# Patient Record
Sex: Female | Born: 1950 | Race: Black or African American | Hispanic: Yes | Smoking: Never smoker
Health system: Southern US, Community
[De-identification: ages and names within clinical notes are randomized; demographics above are authoritative.]

## PROBLEM LIST (undated history)

## (undated) DIAGNOSIS — D869 Sarcoidosis, unspecified: Secondary | ICD-10-CM

## (undated) DIAGNOSIS — M1990 Unspecified osteoarthritis, unspecified site: Secondary | ICD-10-CM

## (undated) DIAGNOSIS — J45909 Unspecified asthma, uncomplicated: Secondary | ICD-10-CM

## (undated) DIAGNOSIS — I509 Heart failure, unspecified: Secondary | ICD-10-CM

## (undated) DIAGNOSIS — I10 Essential (primary) hypertension: Secondary | ICD-10-CM

## (undated) DIAGNOSIS — Q600 Renal agenesis, unilateral: Secondary | ICD-10-CM

## (undated) DIAGNOSIS — D573 Sickle-cell trait: Secondary | ICD-10-CM

## (undated) DIAGNOSIS — G4730 Sleep apnea, unspecified: Secondary | ICD-10-CM

## (undated) DIAGNOSIS — M109 Gout, unspecified: Secondary | ICD-10-CM

## (undated) DIAGNOSIS — R0902 Hypoxemia: Secondary | ICD-10-CM

## (undated) DIAGNOSIS — J449 Chronic obstructive pulmonary disease, unspecified: Secondary | ICD-10-CM

## (undated) DIAGNOSIS — C801 Malignant (primary) neoplasm, unspecified: Secondary | ICD-10-CM

## (undated) DIAGNOSIS — R222 Localized swelling, mass and lump, trunk: Secondary | ICD-10-CM

## (undated) DIAGNOSIS — C3490 Malignant neoplasm of unspecified part of unspecified bronchus or lung: Secondary | ICD-10-CM

## (undated) HISTORY — DX: Unspecified osteoarthritis, unspecified site: M19.90

## (undated) HISTORY — DX: Hypoxemia: R09.02

## (undated) HISTORY — DX: Gout, unspecified: M10.9

## (undated) HISTORY — DX: Localized swelling, mass and lump, trunk: R22.2

## (undated) HISTORY — PX: TUBAL LIGATION: SHX77

## (undated) HISTORY — DX: Malignant (primary) neoplasm, unspecified: C80.1

## (undated) HISTORY — DX: Sarcoidosis, unspecified: D86.9

## (undated) HISTORY — DX: Unspecified asthma, uncomplicated: J45.909

## (undated) HISTORY — DX: Sleep apnea, unspecified: G47.30

## (undated) HISTORY — DX: Renal agenesis, unilateral: Q60.0

## (undated) HISTORY — DX: Chronic obstructive pulmonary disease, unspecified: J44.9

## (undated) HISTORY — DX: Heart failure, unspecified: I50.9

## (undated) HISTORY — DX: Essential (primary) hypertension: I10

## (undated) HISTORY — DX: Sickle-cell trait: D57.3

## (undated) HISTORY — DX: Malignant neoplasm of unspecified part of unspecified bronchus or lung: C34.90

## (undated) HISTORY — PX: LUNG BIOPSY: SHX232

## (undated) HISTORY — PX: VEIN LIGATION AND STRIPPING: SHX2653

---

## 2010-07-20 DIAGNOSIS — C3490 Malignant neoplasm of unspecified part of unspecified bronchus or lung: Secondary | ICD-10-CM

## 2010-07-20 HISTORY — DX: Malignant neoplasm of unspecified part of unspecified bronchus or lung: C34.90

## 2011-07-24 DIAGNOSIS — D869 Sarcoidosis, unspecified: Secondary | ICD-10-CM | POA: Diagnosis not present

## 2011-07-24 DIAGNOSIS — J449 Chronic obstructive pulmonary disease, unspecified: Secondary | ICD-10-CM | POA: Diagnosis not present

## 2011-07-29 DIAGNOSIS — E669 Obesity, unspecified: Secondary | ICD-10-CM | POA: Diagnosis not present

## 2011-07-29 DIAGNOSIS — J449 Chronic obstructive pulmonary disease, unspecified: Secondary | ICD-10-CM | POA: Diagnosis not present

## 2011-07-29 DIAGNOSIS — I119 Hypertensive heart disease without heart failure: Secondary | ICD-10-CM | POA: Diagnosis not present

## 2011-08-13 DIAGNOSIS — C3490 Malignant neoplasm of unspecified part of unspecified bronchus or lung: Secondary | ICD-10-CM | POA: Diagnosis not present

## 2011-08-14 DIAGNOSIS — C3490 Malignant neoplasm of unspecified part of unspecified bronchus or lung: Secondary | ICD-10-CM | POA: Diagnosis not present

## 2011-08-21 DIAGNOSIS — C3490 Malignant neoplasm of unspecified part of unspecified bronchus or lung: Secondary | ICD-10-CM | POA: Diagnosis not present

## 2011-09-03 DIAGNOSIS — R222 Localized swelling, mass and lump, trunk: Secondary | ICD-10-CM | POA: Diagnosis not present

## 2011-09-09 DIAGNOSIS — I10 Essential (primary) hypertension: Secondary | ICD-10-CM | POA: Diagnosis not present

## 2011-09-09 DIAGNOSIS — R222 Localized swelling, mass and lump, trunk: Secondary | ICD-10-CM | POA: Diagnosis not present

## 2011-09-09 DIAGNOSIS — D649 Anemia, unspecified: Secondary | ICD-10-CM | POA: Diagnosis not present

## 2011-09-14 DIAGNOSIS — C3490 Malignant neoplasm of unspecified part of unspecified bronchus or lung: Secondary | ICD-10-CM | POA: Diagnosis not present

## 2011-09-18 DIAGNOSIS — C3490 Malignant neoplasm of unspecified part of unspecified bronchus or lung: Secondary | ICD-10-CM | POA: Diagnosis not present

## 2011-09-25 DIAGNOSIS — C3410 Malignant neoplasm of upper lobe, unspecified bronchus or lung: Secondary | ICD-10-CM | POA: Diagnosis not present

## 2011-09-25 DIAGNOSIS — C3490 Malignant neoplasm of unspecified part of unspecified bronchus or lung: Secondary | ICD-10-CM | POA: Diagnosis not present

## 2011-09-30 DIAGNOSIS — I119 Hypertensive heart disease without heart failure: Secondary | ICD-10-CM | POA: Diagnosis not present

## 2011-09-30 DIAGNOSIS — J449 Chronic obstructive pulmonary disease, unspecified: Secondary | ICD-10-CM | POA: Diagnosis not present

## 2011-10-05 DIAGNOSIS — C3410 Malignant neoplasm of upper lobe, unspecified bronchus or lung: Secondary | ICD-10-CM | POA: Diagnosis not present

## 2011-10-05 DIAGNOSIS — C3490 Malignant neoplasm of unspecified part of unspecified bronchus or lung: Secondary | ICD-10-CM | POA: Diagnosis not present

## 2011-10-07 DIAGNOSIS — C3490 Malignant neoplasm of unspecified part of unspecified bronchus or lung: Secondary | ICD-10-CM | POA: Diagnosis not present

## 2011-10-07 DIAGNOSIS — C3410 Malignant neoplasm of upper lobe, unspecified bronchus or lung: Secondary | ICD-10-CM | POA: Diagnosis not present

## 2011-10-08 DIAGNOSIS — C3410 Malignant neoplasm of upper lobe, unspecified bronchus or lung: Secondary | ICD-10-CM | POA: Diagnosis not present

## 2011-10-08 DIAGNOSIS — C3490 Malignant neoplasm of unspecified part of unspecified bronchus or lung: Secondary | ICD-10-CM | POA: Diagnosis not present

## 2011-10-09 DIAGNOSIS — C3490 Malignant neoplasm of unspecified part of unspecified bronchus or lung: Secondary | ICD-10-CM | POA: Diagnosis not present

## 2011-10-12 DIAGNOSIS — C3490 Malignant neoplasm of unspecified part of unspecified bronchus or lung: Secondary | ICD-10-CM | POA: Diagnosis not present

## 2011-10-14 DIAGNOSIS — C3490 Malignant neoplasm of unspecified part of unspecified bronchus or lung: Secondary | ICD-10-CM | POA: Diagnosis not present

## 2011-10-14 DIAGNOSIS — C3410 Malignant neoplasm of upper lobe, unspecified bronchus or lung: Secondary | ICD-10-CM | POA: Diagnosis not present

## 2011-10-16 DIAGNOSIS — C3490 Malignant neoplasm of unspecified part of unspecified bronchus or lung: Secondary | ICD-10-CM | POA: Diagnosis not present

## 2011-10-19 DIAGNOSIS — C3490 Malignant neoplasm of unspecified part of unspecified bronchus or lung: Secondary | ICD-10-CM | POA: Diagnosis not present

## 2011-11-11 DIAGNOSIS — C3490 Malignant neoplasm of unspecified part of unspecified bronchus or lung: Secondary | ICD-10-CM | POA: Diagnosis not present

## 2011-12-02 DIAGNOSIS — I119 Hypertensive heart disease without heart failure: Secondary | ICD-10-CM | POA: Diagnosis not present

## 2011-12-02 DIAGNOSIS — J441 Chronic obstructive pulmonary disease with (acute) exacerbation: Secondary | ICD-10-CM | POA: Diagnosis not present

## 2012-01-04 DIAGNOSIS — I119 Hypertensive heart disease without heart failure: Secondary | ICD-10-CM | POA: Diagnosis not present

## 2012-01-04 DIAGNOSIS — J441 Chronic obstructive pulmonary disease with (acute) exacerbation: Secondary | ICD-10-CM | POA: Diagnosis not present

## 2012-01-04 DIAGNOSIS — R0602 Shortness of breath: Secondary | ICD-10-CM | POA: Diagnosis not present

## 2012-01-04 DIAGNOSIS — D0220 Carcinoma in situ of unspecified bronchus and lung: Secondary | ICD-10-CM | POA: Diagnosis not present

## 2012-01-04 DIAGNOSIS — R609 Edema, unspecified: Secondary | ICD-10-CM | POA: Diagnosis not present

## 2012-01-04 DIAGNOSIS — R0902 Hypoxemia: Secondary | ICD-10-CM | POA: Diagnosis not present

## 2012-01-04 DIAGNOSIS — J189 Pneumonia, unspecified organism: Secondary | ICD-10-CM | POA: Diagnosis not present

## 2012-01-05 DIAGNOSIS — R0602 Shortness of breath: Secondary | ICD-10-CM | POA: Diagnosis not present

## 2012-01-05 DIAGNOSIS — I119 Hypertensive heart disease without heart failure: Secondary | ICD-10-CM | POA: Diagnosis not present

## 2012-01-05 DIAGNOSIS — R609 Edema, unspecified: Secondary | ICD-10-CM | POA: Diagnosis not present

## 2012-01-05 DIAGNOSIS — I509 Heart failure, unspecified: Secondary | ICD-10-CM | POA: Diagnosis not present

## 2012-01-05 DIAGNOSIS — G4733 Obstructive sleep apnea (adult) (pediatric): Secondary | ICD-10-CM | POA: Diagnosis not present

## 2012-01-05 DIAGNOSIS — J441 Chronic obstructive pulmonary disease with (acute) exacerbation: Secondary | ICD-10-CM | POA: Diagnosis not present

## 2012-01-05 DIAGNOSIS — J99 Respiratory disorders in diseases classified elsewhere: Secondary | ICD-10-CM | POA: Diagnosis not present

## 2012-01-05 DIAGNOSIS — R0902 Hypoxemia: Secondary | ICD-10-CM | POA: Diagnosis not present

## 2012-01-05 DIAGNOSIS — D0220 Carcinoma in situ of unspecified bronchus and lung: Secondary | ICD-10-CM | POA: Diagnosis not present

## 2012-01-05 DIAGNOSIS — D869 Sarcoidosis, unspecified: Secondary | ICD-10-CM | POA: Diagnosis not present

## 2012-01-05 DIAGNOSIS — J9600 Acute respiratory failure, unspecified whether with hypoxia or hypercapnia: Secondary | ICD-10-CM | POA: Diagnosis not present

## 2012-01-05 DIAGNOSIS — C3410 Malignant neoplasm of upper lobe, unspecified bronchus or lung: Secondary | ICD-10-CM | POA: Diagnosis not present

## 2012-01-05 DIAGNOSIS — J189 Pneumonia, unspecified organism: Secondary | ICD-10-CM | POA: Diagnosis not present

## 2012-01-05 DIAGNOSIS — J159 Unspecified bacterial pneumonia: Secondary | ICD-10-CM | POA: Diagnosis not present

## 2012-01-08 DIAGNOSIS — Z6841 Body Mass Index (BMI) 40.0 and over, adult: Secondary | ICD-10-CM | POA: Diagnosis not present

## 2012-01-08 DIAGNOSIS — IMO0002 Reserved for concepts with insufficient information to code with codable children: Secondary | ICD-10-CM | POA: Diagnosis not present

## 2012-01-08 DIAGNOSIS — J441 Chronic obstructive pulmonary disease with (acute) exacerbation: Secondary | ICD-10-CM | POA: Diagnosis not present

## 2012-01-08 DIAGNOSIS — M1990 Unspecified osteoarthritis, unspecified site: Secondary | ICD-10-CM | POA: Diagnosis not present

## 2012-01-08 DIAGNOSIS — J189 Pneumonia, unspecified organism: Secondary | ICD-10-CM | POA: Diagnosis not present

## 2012-01-08 DIAGNOSIS — I119 Hypertensive heart disease without heart failure: Secondary | ICD-10-CM | POA: Diagnosis not present

## 2012-01-11 DIAGNOSIS — Z6841 Body Mass Index (BMI) 40.0 and over, adult: Secondary | ICD-10-CM | POA: Diagnosis not present

## 2012-01-11 DIAGNOSIS — M1990 Unspecified osteoarthritis, unspecified site: Secondary | ICD-10-CM | POA: Diagnosis not present

## 2012-01-11 DIAGNOSIS — J441 Chronic obstructive pulmonary disease with (acute) exacerbation: Secondary | ICD-10-CM | POA: Diagnosis not present

## 2012-01-11 DIAGNOSIS — I119 Hypertensive heart disease without heart failure: Secondary | ICD-10-CM | POA: Diagnosis not present

## 2012-01-11 DIAGNOSIS — J189 Pneumonia, unspecified organism: Secondary | ICD-10-CM | POA: Diagnosis not present

## 2012-01-13 DIAGNOSIS — J449 Chronic obstructive pulmonary disease, unspecified: Secondary | ICD-10-CM | POA: Diagnosis not present

## 2012-01-13 DIAGNOSIS — L0291 Cutaneous abscess, unspecified: Secondary | ICD-10-CM | POA: Diagnosis not present

## 2012-01-13 DIAGNOSIS — I119 Hypertensive heart disease without heart failure: Secondary | ICD-10-CM | POA: Diagnosis not present

## 2012-01-13 DIAGNOSIS — D0220 Carcinoma in situ of unspecified bronchus and lung: Secondary | ICD-10-CM | POA: Diagnosis not present

## 2012-01-14 DIAGNOSIS — M1990 Unspecified osteoarthritis, unspecified site: Secondary | ICD-10-CM | POA: Diagnosis not present

## 2012-01-14 DIAGNOSIS — Z6841 Body Mass Index (BMI) 40.0 and over, adult: Secondary | ICD-10-CM | POA: Diagnosis not present

## 2012-01-14 DIAGNOSIS — J189 Pneumonia, unspecified organism: Secondary | ICD-10-CM | POA: Diagnosis not present

## 2012-01-14 DIAGNOSIS — I119 Hypertensive heart disease without heart failure: Secondary | ICD-10-CM | POA: Diagnosis not present

## 2012-01-14 DIAGNOSIS — J441 Chronic obstructive pulmonary disease with (acute) exacerbation: Secondary | ICD-10-CM | POA: Diagnosis not present

## 2012-01-18 DIAGNOSIS — J189 Pneumonia, unspecified organism: Secondary | ICD-10-CM | POA: Diagnosis not present

## 2012-01-18 DIAGNOSIS — J441 Chronic obstructive pulmonary disease with (acute) exacerbation: Secondary | ICD-10-CM | POA: Diagnosis not present

## 2012-01-18 DIAGNOSIS — M1990 Unspecified osteoarthritis, unspecified site: Secondary | ICD-10-CM | POA: Diagnosis not present

## 2012-01-18 DIAGNOSIS — I119 Hypertensive heart disease without heart failure: Secondary | ICD-10-CM | POA: Diagnosis not present

## 2012-01-18 DIAGNOSIS — Z6841 Body Mass Index (BMI) 40.0 and over, adult: Secondary | ICD-10-CM | POA: Diagnosis not present

## 2012-01-20 DIAGNOSIS — I119 Hypertensive heart disease without heart failure: Secondary | ICD-10-CM | POA: Diagnosis not present

## 2012-01-20 DIAGNOSIS — Z6841 Body Mass Index (BMI) 40.0 and over, adult: Secondary | ICD-10-CM | POA: Diagnosis not present

## 2012-01-20 DIAGNOSIS — M1990 Unspecified osteoarthritis, unspecified site: Secondary | ICD-10-CM | POA: Diagnosis not present

## 2012-01-20 DIAGNOSIS — J441 Chronic obstructive pulmonary disease with (acute) exacerbation: Secondary | ICD-10-CM | POA: Diagnosis not present

## 2012-01-20 DIAGNOSIS — J189 Pneumonia, unspecified organism: Secondary | ICD-10-CM | POA: Diagnosis not present

## 2012-01-22 DIAGNOSIS — J441 Chronic obstructive pulmonary disease with (acute) exacerbation: Secondary | ICD-10-CM | POA: Diagnosis not present

## 2012-01-22 DIAGNOSIS — E119 Type 2 diabetes mellitus without complications: Secondary | ICD-10-CM | POA: Diagnosis not present

## 2012-01-22 DIAGNOSIS — M1990 Unspecified osteoarthritis, unspecified site: Secondary | ICD-10-CM | POA: Diagnosis not present

## 2012-01-22 DIAGNOSIS — M2550 Pain in unspecified joint: Secondary | ICD-10-CM | POA: Diagnosis not present

## 2012-01-22 DIAGNOSIS — J189 Pneumonia, unspecified organism: Secondary | ICD-10-CM | POA: Diagnosis not present

## 2012-01-22 DIAGNOSIS — Z6841 Body Mass Index (BMI) 40.0 and over, adult: Secondary | ICD-10-CM | POA: Diagnosis not present

## 2012-01-22 DIAGNOSIS — I119 Hypertensive heart disease without heart failure: Secondary | ICD-10-CM | POA: Diagnosis not present

## 2012-01-22 DIAGNOSIS — M109 Gout, unspecified: Secondary | ICD-10-CM | POA: Diagnosis not present

## 2012-01-22 DIAGNOSIS — I10 Essential (primary) hypertension: Secondary | ICD-10-CM | POA: Diagnosis not present

## 2012-01-22 DIAGNOSIS — M129 Arthropathy, unspecified: Secondary | ICD-10-CM | POA: Diagnosis not present

## 2012-01-27 DIAGNOSIS — I119 Hypertensive heart disease without heart failure: Secondary | ICD-10-CM | POA: Diagnosis not present

## 2012-01-27 DIAGNOSIS — M1990 Unspecified osteoarthritis, unspecified site: Secondary | ICD-10-CM | POA: Diagnosis not present

## 2012-01-27 DIAGNOSIS — Z6841 Body Mass Index (BMI) 40.0 and over, adult: Secondary | ICD-10-CM | POA: Diagnosis not present

## 2012-01-27 DIAGNOSIS — J441 Chronic obstructive pulmonary disease with (acute) exacerbation: Secondary | ICD-10-CM | POA: Diagnosis not present

## 2012-01-27 DIAGNOSIS — J189 Pneumonia, unspecified organism: Secondary | ICD-10-CM | POA: Diagnosis not present

## 2012-02-02 DIAGNOSIS — Z85118 Personal history of other malignant neoplasm of bronchus and lung: Secondary | ICD-10-CM | POA: Diagnosis not present

## 2012-02-02 DIAGNOSIS — M129 Arthropathy, unspecified: Secondary | ICD-10-CM | POA: Diagnosis not present

## 2012-02-02 DIAGNOSIS — E669 Obesity, unspecified: Secondary | ICD-10-CM | POA: Diagnosis not present

## 2012-02-02 DIAGNOSIS — J449 Chronic obstructive pulmonary disease, unspecified: Secondary | ICD-10-CM | POA: Diagnosis not present

## 2012-02-02 DIAGNOSIS — L03119 Cellulitis of unspecified part of limb: Secondary | ICD-10-CM | POA: Diagnosis not present

## 2012-02-02 DIAGNOSIS — I872 Venous insufficiency (chronic) (peripheral): Secondary | ICD-10-CM | POA: Diagnosis not present

## 2012-02-02 DIAGNOSIS — L02419 Cutaneous abscess of limb, unspecified: Secondary | ICD-10-CM | POA: Diagnosis not present

## 2012-02-03 DIAGNOSIS — J441 Chronic obstructive pulmonary disease with (acute) exacerbation: Secondary | ICD-10-CM | POA: Diagnosis not present

## 2012-02-03 DIAGNOSIS — J189 Pneumonia, unspecified organism: Secondary | ICD-10-CM | POA: Diagnosis not present

## 2012-02-03 DIAGNOSIS — I119 Hypertensive heart disease without heart failure: Secondary | ICD-10-CM | POA: Diagnosis not present

## 2012-02-03 DIAGNOSIS — Z6841 Body Mass Index (BMI) 40.0 and over, adult: Secondary | ICD-10-CM | POA: Diagnosis not present

## 2012-02-03 DIAGNOSIS — M1990 Unspecified osteoarthritis, unspecified site: Secondary | ICD-10-CM | POA: Diagnosis not present

## 2012-02-09 DIAGNOSIS — D869 Sarcoidosis, unspecified: Secondary | ICD-10-CM | POA: Diagnosis not present

## 2012-02-09 DIAGNOSIS — J449 Chronic obstructive pulmonary disease, unspecified: Secondary | ICD-10-CM | POA: Diagnosis not present

## 2012-02-10 DIAGNOSIS — I119 Hypertensive heart disease without heart failure: Secondary | ICD-10-CM | POA: Diagnosis not present

## 2012-02-10 DIAGNOSIS — J189 Pneumonia, unspecified organism: Secondary | ICD-10-CM | POA: Diagnosis not present

## 2012-02-10 DIAGNOSIS — M1990 Unspecified osteoarthritis, unspecified site: Secondary | ICD-10-CM | POA: Diagnosis not present

## 2012-02-10 DIAGNOSIS — J441 Chronic obstructive pulmonary disease with (acute) exacerbation: Secondary | ICD-10-CM | POA: Diagnosis not present

## 2012-02-10 DIAGNOSIS — R0989 Other specified symptoms and signs involving the circulatory and respiratory systems: Secondary | ICD-10-CM | POA: Diagnosis not present

## 2012-02-10 DIAGNOSIS — Z6841 Body Mass Index (BMI) 40.0 and over, adult: Secondary | ICD-10-CM | POA: Diagnosis not present

## 2012-02-10 DIAGNOSIS — M7989 Other specified soft tissue disorders: Secondary | ICD-10-CM | POA: Diagnosis not present

## 2012-02-10 DIAGNOSIS — I70219 Atherosclerosis of native arteries of extremities with intermittent claudication, unspecified extremity: Secondary | ICD-10-CM | POA: Diagnosis not present

## 2012-02-11 DIAGNOSIS — C3410 Malignant neoplasm of upper lobe, unspecified bronchus or lung: Secondary | ICD-10-CM | POA: Diagnosis not present

## 2012-02-12 DIAGNOSIS — C3410 Malignant neoplasm of upper lobe, unspecified bronchus or lung: Secondary | ICD-10-CM | POA: Diagnosis not present

## 2012-02-17 DIAGNOSIS — J449 Chronic obstructive pulmonary disease, unspecified: Secondary | ICD-10-CM | POA: Diagnosis not present

## 2012-02-17 DIAGNOSIS — C3490 Malignant neoplasm of unspecified part of unspecified bronchus or lung: Secondary | ICD-10-CM | POA: Diagnosis not present

## 2012-02-17 DIAGNOSIS — C3410 Malignant neoplasm of upper lobe, unspecified bronchus or lung: Secondary | ICD-10-CM | POA: Diagnosis not present

## 2012-02-17 DIAGNOSIS — E669 Obesity, unspecified: Secondary | ICD-10-CM | POA: Diagnosis not present

## 2012-03-24 DIAGNOSIS — Z9981 Dependence on supplemental oxygen: Secondary | ICD-10-CM | POA: Diagnosis not present

## 2012-03-24 DIAGNOSIS — I872 Venous insufficiency (chronic) (peripheral): Secondary | ICD-10-CM | POA: Diagnosis present

## 2012-03-24 DIAGNOSIS — J45901 Unspecified asthma with (acute) exacerbation: Secondary | ICD-10-CM | POA: Diagnosis not present

## 2012-03-24 DIAGNOSIS — Z85118 Personal history of other malignant neoplasm of bronchus and lung: Secondary | ICD-10-CM | POA: Diagnosis not present

## 2012-03-24 DIAGNOSIS — R079 Chest pain, unspecified: Secondary | ICD-10-CM | POA: Diagnosis not present

## 2012-03-24 DIAGNOSIS — R0602 Shortness of breath: Secondary | ICD-10-CM | POA: Diagnosis not present

## 2012-03-24 DIAGNOSIS — C801 Malignant (primary) neoplasm, unspecified: Secondary | ICD-10-CM | POA: Diagnosis not present

## 2012-03-24 DIAGNOSIS — Z418 Encounter for other procedures for purposes other than remedying health state: Secondary | ICD-10-CM | POA: Diagnosis not present

## 2012-03-24 DIAGNOSIS — I10 Essential (primary) hypertension: Secondary | ICD-10-CM | POA: Diagnosis not present

## 2012-03-24 DIAGNOSIS — I509 Heart failure, unspecified: Secondary | ICD-10-CM | POA: Diagnosis not present

## 2012-03-24 DIAGNOSIS — G4733 Obstructive sleep apnea (adult) (pediatric): Secondary | ICD-10-CM | POA: Diagnosis not present

## 2012-03-24 DIAGNOSIS — L02419 Cutaneous abscess of limb, unspecified: Secondary | ICD-10-CM | POA: Diagnosis not present

## 2012-03-24 DIAGNOSIS — J441 Chronic obstructive pulmonary disease with (acute) exacerbation: Secondary | ICD-10-CM | POA: Diagnosis present

## 2012-03-26 DIAGNOSIS — I509 Heart failure, unspecified: Secondary | ICD-10-CM | POA: Diagnosis not present

## 2012-03-26 DIAGNOSIS — R079 Chest pain, unspecified: Secondary | ICD-10-CM | POA: Diagnosis not present

## 2012-04-06 DIAGNOSIS — I119 Hypertensive heart disease without heart failure: Secondary | ICD-10-CM | POA: Diagnosis not present

## 2012-04-06 DIAGNOSIS — J449 Chronic obstructive pulmonary disease, unspecified: Secondary | ICD-10-CM | POA: Diagnosis not present

## 2012-04-12 DIAGNOSIS — J449 Chronic obstructive pulmonary disease, unspecified: Secondary | ICD-10-CM | POA: Diagnosis not present

## 2012-04-12 DIAGNOSIS — D869 Sarcoidosis, unspecified: Secondary | ICD-10-CM | POA: Diagnosis not present

## 2012-05-16 DIAGNOSIS — C3410 Malignant neoplasm of upper lobe, unspecified bronchus or lung: Secondary | ICD-10-CM | POA: Diagnosis not present

## 2012-05-18 DIAGNOSIS — C3410 Malignant neoplasm of upper lobe, unspecified bronchus or lung: Secondary | ICD-10-CM | POA: Diagnosis not present

## 2012-05-18 DIAGNOSIS — C3490 Malignant neoplasm of unspecified part of unspecified bronchus or lung: Secondary | ICD-10-CM | POA: Diagnosis not present

## 2012-06-01 DIAGNOSIS — I119 Hypertensive heart disease without heart failure: Secondary | ICD-10-CM | POA: Diagnosis not present

## 2012-06-01 DIAGNOSIS — J449 Chronic obstructive pulmonary disease, unspecified: Secondary | ICD-10-CM | POA: Diagnosis not present

## 2012-06-10 DIAGNOSIS — Z1231 Encounter for screening mammogram for malignant neoplasm of breast: Secondary | ICD-10-CM | POA: Diagnosis not present

## 2012-08-17 DIAGNOSIS — C3410 Malignant neoplasm of upper lobe, unspecified bronchus or lung: Secondary | ICD-10-CM | POA: Diagnosis not present

## 2012-08-20 DIAGNOSIS — C3410 Malignant neoplasm of upper lobe, unspecified bronchus or lung: Secondary | ICD-10-CM | POA: Diagnosis not present

## 2012-08-24 DIAGNOSIS — J449 Chronic obstructive pulmonary disease, unspecified: Secondary | ICD-10-CM | POA: Diagnosis not present

## 2012-08-24 DIAGNOSIS — E119 Type 2 diabetes mellitus without complications: Secondary | ICD-10-CM | POA: Diagnosis not present

## 2012-08-24 DIAGNOSIS — I119 Hypertensive heart disease without heart failure: Secondary | ICD-10-CM | POA: Diagnosis not present

## 2012-08-24 DIAGNOSIS — R195 Other fecal abnormalities: Secondary | ICD-10-CM | POA: Diagnosis not present

## 2012-08-24 DIAGNOSIS — D649 Anemia, unspecified: Secondary | ICD-10-CM | POA: Diagnosis not present

## 2012-08-24 DIAGNOSIS — E7800 Pure hypercholesterolemia, unspecified: Secondary | ICD-10-CM | POA: Diagnosis not present

## 2012-08-29 DIAGNOSIS — D869 Sarcoidosis, unspecified: Secondary | ICD-10-CM | POA: Diagnosis not present

## 2012-08-29 DIAGNOSIS — J9819 Other pulmonary collapse: Secondary | ICD-10-CM | POA: Diagnosis not present

## 2012-08-31 DIAGNOSIS — C3410 Malignant neoplasm of upper lobe, unspecified bronchus or lung: Secondary | ICD-10-CM | POA: Diagnosis not present

## 2012-10-19 DIAGNOSIS — I119 Hypertensive heart disease without heart failure: Secondary | ICD-10-CM | POA: Diagnosis not present

## 2012-10-19 DIAGNOSIS — J449 Chronic obstructive pulmonary disease, unspecified: Secondary | ICD-10-CM | POA: Diagnosis not present

## 2012-12-21 DIAGNOSIS — I119 Hypertensive heart disease without heart failure: Secondary | ICD-10-CM | POA: Diagnosis not present

## 2012-12-21 DIAGNOSIS — C3410 Malignant neoplasm of upper lobe, unspecified bronchus or lung: Secondary | ICD-10-CM | POA: Diagnosis not present

## 2012-12-21 DIAGNOSIS — J449 Chronic obstructive pulmonary disease, unspecified: Secondary | ICD-10-CM | POA: Diagnosis not present

## 2013-01-04 DIAGNOSIS — C3410 Malignant neoplasm of upper lobe, unspecified bronchus or lung: Secondary | ICD-10-CM | POA: Diagnosis not present

## 2013-01-04 DIAGNOSIS — K7689 Other specified diseases of liver: Secondary | ICD-10-CM | POA: Diagnosis not present

## 2013-01-04 DIAGNOSIS — C3490 Malignant neoplasm of unspecified part of unspecified bronchus or lung: Secondary | ICD-10-CM | POA: Diagnosis not present

## 2013-02-13 DIAGNOSIS — Z9981 Dependence on supplemental oxygen: Secondary | ICD-10-CM | POA: Diagnosis not present

## 2013-02-13 DIAGNOSIS — R0989 Other specified symptoms and signs involving the circulatory and respiratory systems: Secondary | ICD-10-CM | POA: Diagnosis not present

## 2013-02-13 DIAGNOSIS — J441 Chronic obstructive pulmonary disease with (acute) exacerbation: Secondary | ICD-10-CM | POA: Diagnosis not present

## 2013-02-13 DIAGNOSIS — I509 Heart failure, unspecified: Secondary | ICD-10-CM | POA: Diagnosis not present

## 2013-02-13 DIAGNOSIS — J4489 Other specified chronic obstructive pulmonary disease: Secondary | ICD-10-CM | POA: Diagnosis not present

## 2013-02-13 DIAGNOSIS — Z8249 Family history of ischemic heart disease and other diseases of the circulatory system: Secondary | ICD-10-CM | POA: Diagnosis not present

## 2013-02-13 DIAGNOSIS — J449 Chronic obstructive pulmonary disease, unspecified: Secondary | ICD-10-CM | POA: Diagnosis not present

## 2013-02-13 DIAGNOSIS — R0609 Other forms of dyspnea: Secondary | ICD-10-CM | POA: Diagnosis not present

## 2013-02-13 DIAGNOSIS — E872 Acidosis: Secondary | ICD-10-CM | POA: Diagnosis not present

## 2013-02-13 DIAGNOSIS — R079 Chest pain, unspecified: Secondary | ICD-10-CM | POA: Diagnosis not present

## 2013-02-13 DIAGNOSIS — I129 Hypertensive chronic kidney disease with stage 1 through stage 4 chronic kidney disease, or unspecified chronic kidney disease: Secondary | ICD-10-CM | POA: Diagnosis not present

## 2013-02-13 DIAGNOSIS — N189 Chronic kidney disease, unspecified: Secondary | ICD-10-CM | POA: Diagnosis not present

## 2013-02-13 DIAGNOSIS — R030 Elevated blood-pressure reading, without diagnosis of hypertension: Secondary | ICD-10-CM | POA: Diagnosis not present

## 2013-02-13 DIAGNOSIS — I5030 Unspecified diastolic (congestive) heart failure: Secondary | ICD-10-CM | POA: Diagnosis not present

## 2013-02-13 DIAGNOSIS — Z9119 Patient's noncompliance with other medical treatment and regimen: Secondary | ICD-10-CM | POA: Diagnosis not present

## 2013-02-13 DIAGNOSIS — J9600 Acute respiratory failure, unspecified whether with hypoxia or hypercapnia: Secondary | ICD-10-CM | POA: Diagnosis not present

## 2013-02-13 DIAGNOSIS — J440 Chronic obstructive pulmonary disease with acute lower respiratory infection: Secondary | ICD-10-CM | POA: Diagnosis not present

## 2013-02-13 DIAGNOSIS — J209 Acute bronchitis, unspecified: Secondary | ICD-10-CM | POA: Diagnosis not present

## 2013-02-13 DIAGNOSIS — G4733 Obstructive sleep apnea (adult) (pediatric): Secondary | ICD-10-CM | POA: Diagnosis not present

## 2013-02-18 DIAGNOSIS — I5030 Unspecified diastolic (congestive) heart failure: Secondary | ICD-10-CM | POA: Diagnosis not present

## 2013-02-18 DIAGNOSIS — G4733 Obstructive sleep apnea (adult) (pediatric): Secondary | ICD-10-CM | POA: Diagnosis not present

## 2013-02-18 DIAGNOSIS — J45901 Unspecified asthma with (acute) exacerbation: Secondary | ICD-10-CM | POA: Diagnosis not present

## 2013-02-18 DIAGNOSIS — R5381 Other malaise: Secondary | ICD-10-CM | POA: Diagnosis not present

## 2013-02-18 DIAGNOSIS — I509 Heart failure, unspecified: Secondary | ICD-10-CM | POA: Diagnosis not present

## 2013-02-18 DIAGNOSIS — Z5189 Encounter for other specified aftercare: Secondary | ICD-10-CM | POA: Diagnosis not present

## 2013-02-18 DIAGNOSIS — I129 Hypertensive chronic kidney disease with stage 1 through stage 4 chronic kidney disease, or unspecified chronic kidney disease: Secondary | ICD-10-CM | POA: Diagnosis not present

## 2013-02-18 DIAGNOSIS — Z9981 Dependence on supplemental oxygen: Secondary | ICD-10-CM | POA: Diagnosis not present

## 2013-02-18 DIAGNOSIS — N189 Chronic kidney disease, unspecified: Secondary | ICD-10-CM | POA: Diagnosis not present

## 2013-02-18 DIAGNOSIS — Z6841 Body Mass Index (BMI) 40.0 and over, adult: Secondary | ICD-10-CM | POA: Diagnosis not present

## 2013-02-18 DIAGNOSIS — Z85118 Personal history of other malignant neoplasm of bronchus and lung: Secondary | ICD-10-CM | POA: Diagnosis not present

## 2013-02-20 DIAGNOSIS — J441 Chronic obstructive pulmonary disease with (acute) exacerbation: Secondary | ICD-10-CM | POA: Diagnosis not present

## 2013-02-20 DIAGNOSIS — I509 Heart failure, unspecified: Secondary | ICD-10-CM | POA: Diagnosis not present

## 2013-02-20 DIAGNOSIS — I5030 Unspecified diastolic (congestive) heart failure: Secondary | ICD-10-CM | POA: Diagnosis not present

## 2013-02-20 DIAGNOSIS — I129 Hypertensive chronic kidney disease with stage 1 through stage 4 chronic kidney disease, or unspecified chronic kidney disease: Secondary | ICD-10-CM | POA: Diagnosis not present

## 2013-02-20 DIAGNOSIS — G4733 Obstructive sleep apnea (adult) (pediatric): Secondary | ICD-10-CM | POA: Diagnosis not present

## 2013-02-20 DIAGNOSIS — Z5189 Encounter for other specified aftercare: Secondary | ICD-10-CM | POA: Diagnosis not present

## 2013-02-21 DIAGNOSIS — G4733 Obstructive sleep apnea (adult) (pediatric): Secondary | ICD-10-CM | POA: Diagnosis not present

## 2013-02-21 DIAGNOSIS — I129 Hypertensive chronic kidney disease with stage 1 through stage 4 chronic kidney disease, or unspecified chronic kidney disease: Secondary | ICD-10-CM | POA: Diagnosis not present

## 2013-02-21 DIAGNOSIS — J441 Chronic obstructive pulmonary disease with (acute) exacerbation: Secondary | ICD-10-CM | POA: Diagnosis not present

## 2013-02-21 DIAGNOSIS — I5030 Unspecified diastolic (congestive) heart failure: Secondary | ICD-10-CM | POA: Diagnosis not present

## 2013-02-21 DIAGNOSIS — I509 Heart failure, unspecified: Secondary | ICD-10-CM | POA: Diagnosis not present

## 2013-02-21 DIAGNOSIS — Z5189 Encounter for other specified aftercare: Secondary | ICD-10-CM | POA: Diagnosis not present

## 2013-02-22 DIAGNOSIS — I119 Hypertensive heart disease without heart failure: Secondary | ICD-10-CM | POA: Diagnosis not present

## 2013-02-22 DIAGNOSIS — J449 Chronic obstructive pulmonary disease, unspecified: Secondary | ICD-10-CM | POA: Diagnosis not present

## 2013-02-23 DIAGNOSIS — Z5189 Encounter for other specified aftercare: Secondary | ICD-10-CM | POA: Diagnosis not present

## 2013-02-23 DIAGNOSIS — I5030 Unspecified diastolic (congestive) heart failure: Secondary | ICD-10-CM | POA: Diagnosis not present

## 2013-02-23 DIAGNOSIS — G4733 Obstructive sleep apnea (adult) (pediatric): Secondary | ICD-10-CM | POA: Diagnosis not present

## 2013-02-23 DIAGNOSIS — J45901 Unspecified asthma with (acute) exacerbation: Secondary | ICD-10-CM | POA: Diagnosis not present

## 2013-02-23 DIAGNOSIS — I129 Hypertensive chronic kidney disease with stage 1 through stage 4 chronic kidney disease, or unspecified chronic kidney disease: Secondary | ICD-10-CM | POA: Diagnosis not present

## 2013-02-23 DIAGNOSIS — I509 Heart failure, unspecified: Secondary | ICD-10-CM | POA: Diagnosis not present

## 2013-02-24 DIAGNOSIS — I129 Hypertensive chronic kidney disease with stage 1 through stage 4 chronic kidney disease, or unspecified chronic kidney disease: Secondary | ICD-10-CM | POA: Diagnosis not present

## 2013-02-24 DIAGNOSIS — G4733 Obstructive sleep apnea (adult) (pediatric): Secondary | ICD-10-CM | POA: Diagnosis not present

## 2013-02-24 DIAGNOSIS — J45901 Unspecified asthma with (acute) exacerbation: Secondary | ICD-10-CM | POA: Diagnosis not present

## 2013-02-24 DIAGNOSIS — I5030 Unspecified diastolic (congestive) heart failure: Secondary | ICD-10-CM | POA: Diagnosis not present

## 2013-02-24 DIAGNOSIS — I509 Heart failure, unspecified: Secondary | ICD-10-CM | POA: Diagnosis not present

## 2013-02-24 DIAGNOSIS — Z5189 Encounter for other specified aftercare: Secondary | ICD-10-CM | POA: Diagnosis not present

## 2013-03-01 DIAGNOSIS — I5030 Unspecified diastolic (congestive) heart failure: Secondary | ICD-10-CM | POA: Diagnosis not present

## 2013-03-01 DIAGNOSIS — J45901 Unspecified asthma with (acute) exacerbation: Secondary | ICD-10-CM | POA: Diagnosis not present

## 2013-03-01 DIAGNOSIS — I509 Heart failure, unspecified: Secondary | ICD-10-CM | POA: Diagnosis not present

## 2013-03-01 DIAGNOSIS — Z5189 Encounter for other specified aftercare: Secondary | ICD-10-CM | POA: Diagnosis not present

## 2013-03-01 DIAGNOSIS — G4733 Obstructive sleep apnea (adult) (pediatric): Secondary | ICD-10-CM | POA: Diagnosis not present

## 2013-03-01 DIAGNOSIS — I129 Hypertensive chronic kidney disease with stage 1 through stage 4 chronic kidney disease, or unspecified chronic kidney disease: Secondary | ICD-10-CM | POA: Diagnosis not present

## 2013-03-02 DIAGNOSIS — Z5189 Encounter for other specified aftercare: Secondary | ICD-10-CM | POA: Diagnosis not present

## 2013-03-02 DIAGNOSIS — G4733 Obstructive sleep apnea (adult) (pediatric): Secondary | ICD-10-CM | POA: Diagnosis not present

## 2013-03-02 DIAGNOSIS — I509 Heart failure, unspecified: Secondary | ICD-10-CM | POA: Diagnosis not present

## 2013-03-02 DIAGNOSIS — I5030 Unspecified diastolic (congestive) heart failure: Secondary | ICD-10-CM | POA: Diagnosis not present

## 2013-03-02 DIAGNOSIS — J441 Chronic obstructive pulmonary disease with (acute) exacerbation: Secondary | ICD-10-CM | POA: Diagnosis not present

## 2013-03-02 DIAGNOSIS — I129 Hypertensive chronic kidney disease with stage 1 through stage 4 chronic kidney disease, or unspecified chronic kidney disease: Secondary | ICD-10-CM | POA: Diagnosis not present

## 2013-03-03 DIAGNOSIS — I5030 Unspecified diastolic (congestive) heart failure: Secondary | ICD-10-CM | POA: Diagnosis not present

## 2013-03-03 DIAGNOSIS — J441 Chronic obstructive pulmonary disease with (acute) exacerbation: Secondary | ICD-10-CM | POA: Diagnosis not present

## 2013-03-03 DIAGNOSIS — I509 Heart failure, unspecified: Secondary | ICD-10-CM | POA: Diagnosis not present

## 2013-03-03 DIAGNOSIS — Z5189 Encounter for other specified aftercare: Secondary | ICD-10-CM | POA: Diagnosis not present

## 2013-03-03 DIAGNOSIS — G4733 Obstructive sleep apnea (adult) (pediatric): Secondary | ICD-10-CM | POA: Diagnosis not present

## 2013-03-03 DIAGNOSIS — I129 Hypertensive chronic kidney disease with stage 1 through stage 4 chronic kidney disease, or unspecified chronic kidney disease: Secondary | ICD-10-CM | POA: Diagnosis not present

## 2013-03-06 DIAGNOSIS — I129 Hypertensive chronic kidney disease with stage 1 through stage 4 chronic kidney disease, or unspecified chronic kidney disease: Secondary | ICD-10-CM | POA: Diagnosis not present

## 2013-03-06 DIAGNOSIS — J441 Chronic obstructive pulmonary disease with (acute) exacerbation: Secondary | ICD-10-CM | POA: Diagnosis not present

## 2013-03-06 DIAGNOSIS — I509 Heart failure, unspecified: Secondary | ICD-10-CM | POA: Diagnosis not present

## 2013-03-06 DIAGNOSIS — Z5189 Encounter for other specified aftercare: Secondary | ICD-10-CM | POA: Diagnosis not present

## 2013-03-06 DIAGNOSIS — G4733 Obstructive sleep apnea (adult) (pediatric): Secondary | ICD-10-CM | POA: Diagnosis not present

## 2013-03-06 DIAGNOSIS — I5030 Unspecified diastolic (congestive) heart failure: Secondary | ICD-10-CM | POA: Diagnosis not present

## 2013-03-07 DIAGNOSIS — I129 Hypertensive chronic kidney disease with stage 1 through stage 4 chronic kidney disease, or unspecified chronic kidney disease: Secondary | ICD-10-CM | POA: Diagnosis not present

## 2013-03-07 DIAGNOSIS — Z5189 Encounter for other specified aftercare: Secondary | ICD-10-CM | POA: Diagnosis not present

## 2013-03-07 DIAGNOSIS — J441 Chronic obstructive pulmonary disease with (acute) exacerbation: Secondary | ICD-10-CM | POA: Diagnosis not present

## 2013-03-07 DIAGNOSIS — I5030 Unspecified diastolic (congestive) heart failure: Secondary | ICD-10-CM | POA: Diagnosis not present

## 2013-03-07 DIAGNOSIS — G4733 Obstructive sleep apnea (adult) (pediatric): Secondary | ICD-10-CM | POA: Diagnosis not present

## 2013-03-07 DIAGNOSIS — I509 Heart failure, unspecified: Secondary | ICD-10-CM | POA: Diagnosis not present

## 2013-03-09 DIAGNOSIS — I5030 Unspecified diastolic (congestive) heart failure: Secondary | ICD-10-CM | POA: Diagnosis not present

## 2013-03-09 DIAGNOSIS — I509 Heart failure, unspecified: Secondary | ICD-10-CM | POA: Diagnosis not present

## 2013-03-09 DIAGNOSIS — Z5189 Encounter for other specified aftercare: Secondary | ICD-10-CM | POA: Diagnosis not present

## 2013-03-09 DIAGNOSIS — J441 Chronic obstructive pulmonary disease with (acute) exacerbation: Secondary | ICD-10-CM | POA: Diagnosis not present

## 2013-03-09 DIAGNOSIS — I129 Hypertensive chronic kidney disease with stage 1 through stage 4 chronic kidney disease, or unspecified chronic kidney disease: Secondary | ICD-10-CM | POA: Diagnosis not present

## 2013-03-09 DIAGNOSIS — G4733 Obstructive sleep apnea (adult) (pediatric): Secondary | ICD-10-CM | POA: Diagnosis not present

## 2013-04-05 DIAGNOSIS — Z9981 Dependence on supplemental oxygen: Secondary | ICD-10-CM | POA: Diagnosis not present

## 2013-04-05 DIAGNOSIS — I509 Heart failure, unspecified: Secondary | ICD-10-CM | POA: Diagnosis not present

## 2013-04-05 DIAGNOSIS — J9819 Other pulmonary collapse: Secondary | ICD-10-CM | POA: Diagnosis not present

## 2013-04-05 DIAGNOSIS — I498 Other specified cardiac arrhythmias: Secondary | ICD-10-CM | POA: Diagnosis not present

## 2013-04-05 DIAGNOSIS — G4730 Sleep apnea, unspecified: Secondary | ICD-10-CM | POA: Diagnosis not present

## 2013-04-05 DIAGNOSIS — J441 Chronic obstructive pulmonary disease with (acute) exacerbation: Secondary | ICD-10-CM | POA: Diagnosis not present

## 2013-04-05 DIAGNOSIS — J45909 Unspecified asthma, uncomplicated: Secondary | ICD-10-CM | POA: Diagnosis not present

## 2013-04-19 DIAGNOSIS — J449 Chronic obstructive pulmonary disease, unspecified: Secondary | ICD-10-CM | POA: Diagnosis not present

## 2013-04-19 DIAGNOSIS — I119 Hypertensive heart disease without heart failure: Secondary | ICD-10-CM | POA: Diagnosis not present

## 2013-05-03 DIAGNOSIS — C3410 Malignant neoplasm of upper lobe, unspecified bronchus or lung: Secondary | ICD-10-CM | POA: Diagnosis not present

## 2013-05-17 DIAGNOSIS — R911 Solitary pulmonary nodule: Secondary | ICD-10-CM | POA: Diagnosis not present

## 2013-05-17 DIAGNOSIS — C3410 Malignant neoplasm of upper lobe, unspecified bronchus or lung: Secondary | ICD-10-CM | POA: Diagnosis not present

## 2013-05-17 DIAGNOSIS — C3490 Malignant neoplasm of unspecified part of unspecified bronchus or lung: Secondary | ICD-10-CM | POA: Diagnosis not present

## 2013-05-17 DIAGNOSIS — Z09 Encounter for follow-up examination after completed treatment for conditions other than malignant neoplasm: Secondary | ICD-10-CM | POA: Diagnosis not present

## 2013-06-13 LAB — HM MAMMOGRAPHY

## 2013-06-21 DIAGNOSIS — J449 Chronic obstructive pulmonary disease, unspecified: Secondary | ICD-10-CM | POA: Diagnosis not present

## 2013-06-21 DIAGNOSIS — E119 Type 2 diabetes mellitus without complications: Secondary | ICD-10-CM | POA: Diagnosis not present

## 2013-06-21 DIAGNOSIS — I119 Hypertensive heart disease without heart failure: Secondary | ICD-10-CM | POA: Diagnosis not present

## 2013-06-21 DIAGNOSIS — D649 Anemia, unspecified: Secondary | ICD-10-CM | POA: Diagnosis not present

## 2013-06-21 DIAGNOSIS — E7800 Pure hypercholesterolemia, unspecified: Secondary | ICD-10-CM | POA: Diagnosis not present

## 2013-06-26 DIAGNOSIS — Z1231 Encounter for screening mammogram for malignant neoplasm of breast: Secondary | ICD-10-CM | POA: Diagnosis not present

## 2013-07-31 DIAGNOSIS — G4700 Insomnia, unspecified: Secondary | ICD-10-CM | POA: Diagnosis not present

## 2013-07-31 DIAGNOSIS — J449 Chronic obstructive pulmonary disease, unspecified: Secondary | ICD-10-CM | POA: Diagnosis not present

## 2013-07-31 DIAGNOSIS — G4730 Sleep apnea, unspecified: Secondary | ICD-10-CM | POA: Diagnosis not present

## 2013-07-31 LAB — PULMONARY FUNCTION TEST

## 2013-08-09 DIAGNOSIS — I119 Hypertensive heart disease without heart failure: Secondary | ICD-10-CM | POA: Diagnosis not present

## 2013-08-09 DIAGNOSIS — J069 Acute upper respiratory infection, unspecified: Secondary | ICD-10-CM | POA: Diagnosis not present

## 2013-08-09 DIAGNOSIS — J449 Chronic obstructive pulmonary disease, unspecified: Secondary | ICD-10-CM | POA: Diagnosis not present

## 2013-10-04 DIAGNOSIS — I509 Heart failure, unspecified: Secondary | ICD-10-CM | POA: Diagnosis not present

## 2013-10-04 DIAGNOSIS — J449 Chronic obstructive pulmonary disease, unspecified: Secondary | ICD-10-CM | POA: Diagnosis not present

## 2013-10-04 DIAGNOSIS — L0291 Cutaneous abscess, unspecified: Secondary | ICD-10-CM | POA: Diagnosis not present

## 2013-10-18 DIAGNOSIS — I110 Hypertensive heart disease with heart failure: Secondary | ICD-10-CM | POA: Diagnosis not present

## 2013-10-18 DIAGNOSIS — I509 Heart failure, unspecified: Secondary | ICD-10-CM | POA: Diagnosis not present

## 2013-10-18 DIAGNOSIS — L0291 Cutaneous abscess, unspecified: Secondary | ICD-10-CM | POA: Diagnosis not present

## 2013-10-18 DIAGNOSIS — L0390 Cellulitis, unspecified: Secondary | ICD-10-CM | POA: Diagnosis not present

## 2013-10-18 DIAGNOSIS — J449 Chronic obstructive pulmonary disease, unspecified: Secondary | ICD-10-CM | POA: Diagnosis not present

## 2013-11-08 DIAGNOSIS — J449 Chronic obstructive pulmonary disease, unspecified: Secondary | ICD-10-CM | POA: Diagnosis not present

## 2013-11-08 DIAGNOSIS — I509 Heart failure, unspecified: Secondary | ICD-10-CM | POA: Diagnosis not present

## 2013-11-08 DIAGNOSIS — M1990 Unspecified osteoarthritis, unspecified site: Secondary | ICD-10-CM | POA: Diagnosis not present

## 2013-11-08 DIAGNOSIS — C3410 Malignant neoplasm of upper lobe, unspecified bronchus or lung: Secondary | ICD-10-CM | POA: Diagnosis not present

## 2013-11-08 DIAGNOSIS — I110 Hypertensive heart disease with heart failure: Secondary | ICD-10-CM | POA: Diagnosis not present

## 2013-11-15 DIAGNOSIS — C3410 Malignant neoplasm of upper lobe, unspecified bronchus or lung: Secondary | ICD-10-CM | POA: Diagnosis not present

## 2013-11-26 DIAGNOSIS — M545 Low back pain, unspecified: Secondary | ICD-10-CM | POA: Diagnosis not present

## 2013-11-26 DIAGNOSIS — M169 Osteoarthritis of hip, unspecified: Secondary | ICD-10-CM | POA: Diagnosis not present

## 2013-11-26 DIAGNOSIS — M1990 Unspecified osteoarthritis, unspecified site: Secondary | ICD-10-CM | POA: Diagnosis not present

## 2013-11-26 DIAGNOSIS — M1610 Unilateral primary osteoarthritis, unspecified hip: Secondary | ICD-10-CM | POA: Diagnosis not present

## 2013-11-26 DIAGNOSIS — I509 Heart failure, unspecified: Secondary | ICD-10-CM | POA: Diagnosis not present

## 2013-11-26 DIAGNOSIS — M5137 Other intervertebral disc degeneration, lumbosacral region: Secondary | ICD-10-CM | POA: Diagnosis not present

## 2013-11-26 DIAGNOSIS — I739 Peripheral vascular disease, unspecified: Secondary | ICD-10-CM | POA: Diagnosis not present

## 2013-11-26 DIAGNOSIS — M25559 Pain in unspecified hip: Secondary | ICD-10-CM | POA: Diagnosis not present

## 2013-11-26 DIAGNOSIS — M129 Arthropathy, unspecified: Secondary | ICD-10-CM | POA: Diagnosis not present

## 2013-11-26 DIAGNOSIS — J45909 Unspecified asthma, uncomplicated: Secondary | ICD-10-CM | POA: Diagnosis not present

## 2013-11-26 DIAGNOSIS — G4733 Obstructive sleep apnea (adult) (pediatric): Secondary | ICD-10-CM | POA: Diagnosis not present

## 2013-11-26 DIAGNOSIS — M51379 Other intervertebral disc degeneration, lumbosacral region without mention of lumbar back pain or lower extremity pain: Secondary | ICD-10-CM | POA: Diagnosis not present

## 2013-11-26 DIAGNOSIS — N12 Tubulo-interstitial nephritis, not specified as acute or chronic: Secondary | ICD-10-CM | POA: Diagnosis not present

## 2013-11-29 DIAGNOSIS — C3490 Malignant neoplasm of unspecified part of unspecified bronchus or lung: Secondary | ICD-10-CM | POA: Diagnosis not present

## 2013-11-29 DIAGNOSIS — R222 Localized swelling, mass and lump, trunk: Secondary | ICD-10-CM | POA: Diagnosis not present

## 2013-11-29 DIAGNOSIS — C3410 Malignant neoplasm of upper lobe, unspecified bronchus or lung: Secondary | ICD-10-CM | POA: Diagnosis not present

## 2013-12-12 DIAGNOSIS — E119 Type 2 diabetes mellitus without complications: Secondary | ICD-10-CM | POA: Diagnosis not present

## 2013-12-12 DIAGNOSIS — I119 Hypertensive heart disease without heart failure: Secondary | ICD-10-CM | POA: Diagnosis not present

## 2013-12-12 DIAGNOSIS — J449 Chronic obstructive pulmonary disease, unspecified: Secondary | ICD-10-CM | POA: Diagnosis not present

## 2013-12-13 DIAGNOSIS — C3410 Malignant neoplasm of upper lobe, unspecified bronchus or lung: Secondary | ICD-10-CM | POA: Diagnosis not present

## 2013-12-13 DIAGNOSIS — D481 Neoplasm of uncertain behavior of connective and other soft tissue: Secondary | ICD-10-CM | POA: Diagnosis not present

## 2013-12-13 DIAGNOSIS — R222 Localized swelling, mass and lump, trunk: Secondary | ICD-10-CM | POA: Diagnosis not present

## 2013-12-28 DIAGNOSIS — I110 Hypertensive heart disease with heart failure: Secondary | ICD-10-CM | POA: Diagnosis not present

## 2013-12-28 DIAGNOSIS — I509 Heart failure, unspecified: Secondary | ICD-10-CM | POA: Diagnosis not present

## 2013-12-28 DIAGNOSIS — J449 Chronic obstructive pulmonary disease, unspecified: Secondary | ICD-10-CM | POA: Diagnosis not present

## 2014-02-12 DIAGNOSIS — G4700 Insomnia, unspecified: Secondary | ICD-10-CM | POA: Diagnosis not present

## 2014-02-12 DIAGNOSIS — C3490 Malignant neoplasm of unspecified part of unspecified bronchus or lung: Secondary | ICD-10-CM | POA: Diagnosis not present

## 2014-02-12 DIAGNOSIS — J449 Chronic obstructive pulmonary disease, unspecified: Secondary | ICD-10-CM | POA: Diagnosis not present

## 2014-02-21 DIAGNOSIS — I119 Hypertensive heart disease without heart failure: Secondary | ICD-10-CM | POA: Diagnosis not present

## 2014-02-21 DIAGNOSIS — J449 Chronic obstructive pulmonary disease, unspecified: Secondary | ICD-10-CM | POA: Diagnosis not present

## 2014-04-09 ENCOUNTER — Ambulatory Visit: Payer: Self-pay | Admitting: Family Medicine

## 2014-04-13 ENCOUNTER — Ambulatory Visit (INDEPENDENT_AMBULATORY_CARE_PROVIDER_SITE_OTHER): Payer: Medicare Other | Admitting: Family Medicine

## 2014-04-13 ENCOUNTER — Encounter: Payer: Self-pay | Admitting: Family Medicine

## 2014-04-13 VITALS — BP 127/51 | HR 79 | Temp 97.9°F | Resp 18 | Ht 65.0 in | Wt 360.0 lb

## 2014-04-13 DIAGNOSIS — Z23 Encounter for immunization: Secondary | ICD-10-CM

## 2014-04-13 DIAGNOSIS — R7309 Other abnormal glucose: Secondary | ICD-10-CM | POA: Diagnosis not present

## 2014-04-13 DIAGNOSIS — I10 Essential (primary) hypertension: Secondary | ICD-10-CM | POA: Diagnosis not present

## 2014-04-13 DIAGNOSIS — J449 Chronic obstructive pulmonary disease, unspecified: Secondary | ICD-10-CM

## 2014-04-13 DIAGNOSIS — R609 Edema, unspecified: Secondary | ICD-10-CM

## 2014-04-13 DIAGNOSIS — R7303 Prediabetes: Secondary | ICD-10-CM | POA: Insufficient documentation

## 2014-04-13 DIAGNOSIS — Z87898 Personal history of other specified conditions: Secondary | ICD-10-CM

## 2014-04-13 DIAGNOSIS — R600 Localized edema: Secondary | ICD-10-CM | POA: Insufficient documentation

## 2014-04-13 DIAGNOSIS — I509 Heart failure, unspecified: Secondary | ICD-10-CM | POA: Diagnosis not present

## 2014-04-13 DIAGNOSIS — Z9981 Dependence on supplemental oxygen: Secondary | ICD-10-CM

## 2014-04-13 DIAGNOSIS — Z85118 Personal history of other malignant neoplasm of bronchus and lung: Secondary | ICD-10-CM | POA: Insufficient documentation

## 2014-04-13 DIAGNOSIS — Z8669 Personal history of other diseases of the nervous system and sense organs: Secondary | ICD-10-CM

## 2014-04-13 LAB — COMPLETE METABOLIC PANEL WITH GFR
ALT: 26 U/L (ref 0–35)
AST: 23 U/L (ref 0–37)
Albumin: 3.8 g/dL (ref 3.5–5.2)
Alkaline Phosphatase: 240 U/L — ABNORMAL HIGH (ref 39–117)
BILIRUBIN TOTAL: 0.5 mg/dL (ref 0.2–1.2)
BUN: 34 mg/dL — ABNORMAL HIGH (ref 6–23)
CO2: 31 meq/L (ref 19–32)
CREATININE: 1.88 mg/dL — AB (ref 0.50–1.10)
Calcium: 8.8 mg/dL (ref 8.4–10.5)
Chloride: 97 mEq/L (ref 96–112)
GFR, EST AFRICAN AMERICAN: 33 mL/min — AB
GFR, EST NON AFRICAN AMERICAN: 28 mL/min — AB
GLUCOSE: 80 mg/dL (ref 70–99)
Potassium: 3.3 mEq/L — ABNORMAL LOW (ref 3.5–5.3)
Sodium: 141 mEq/L (ref 135–145)
TOTAL PROTEIN: 6.7 g/dL (ref 6.0–8.3)

## 2014-04-13 LAB — LIPID PANEL
CHOLESTEROL: 171 mg/dL (ref 0–200)
HDL: 59 mg/dL (ref >39)
LDL CALC: 89 mg/dL (ref 0–99)
Total CHOL/HDL Ratio: 2.9 Ratio
Triglycerides: 116 mg/dL (ref <150)
VLDL: 23 mg/dL (ref 0–40)

## 2014-04-13 NOTE — Patient Instructions (Signed)
DASH Eating Plan DASH stands for "Dietary Approaches to Stop Hypertension." The DASH eating plan is a healthy eating plan that has been shown to reduce high blood pressure (hypertension). Additional health benefits may include reducing the risk of type 2 diabetes mellitus, heart disease, and stroke. The DASH eating plan may also help with weight loss. WHAT DO I NEED TO KNOW ABOUT THE DASH EATING PLAN? For the DASH eating plan, you will follow these general guidelines:  Choose foods with a percent daily value for sodium of less than 5% (as listed on the food label).  Use salt-free seasonings or herbs instead of table salt or sea salt.  Check with your health care provider or pharmacist before using salt substitutes.  Eat lower-sodium products, often labeled as "lower sodium" or "no salt added."  Eat fresh foods.  Eat more vegetables, fruits, and low-fat dairy products.  Choose whole grains. Look for the word "whole" as the first word in the ingredient list.  Choose fish and skinless chicken or turkey more often than red meat. Limit fish, poultry, and meat to 6 oz (170 g) each day.  Limit sweets, desserts, sugars, and sugary drinks.  Choose heart-healthy fats.  Limit cheese to 1 oz (28 g) per day.  Eat more home-cooked food and less restaurant, buffet, and fast food.  Limit fried foods.  Cook foods using methods other than frying.  Limit canned vegetables. If you do use them, rinse them well to decrease the sodium.  When eating at a restaurant, ask that your food be prepared with less salt, or no salt if possible. WHAT FOODS CAN I EAT? Seek help from a dietitian for individual calorie needs. Grains Whole grain or whole wheat bread. Brown rice. Whole grain or whole wheat pasta. Quinoa, bulgur, and whole grain cereals. Low-sodium cereals. Corn or whole wheat flour tortillas. Whole grain cornbread. Whole grain crackers. Low-sodium crackers. Vegetables Fresh or frozen vegetables  (raw, steamed, roasted, or grilled). Low-sodium or reduced-sodium tomato and vegetable juices. Low-sodium or reduced-sodium tomato sauce and paste. Low-sodium or reduced-sodium canned vegetables.  Fruits All fresh, canned (in natural juice), or frozen fruits. Meat and Other Protein Products Ground beef (85% or leaner), grass-fed beef, or beef trimmed of fat. Skinless chicken or turkey. Ground chicken or turkey. Pork trimmed of fat. All fish and seafood. Eggs. Dried beans, peas, or lentils. Unsalted nuts and seeds. Unsalted canned beans. Dairy Low-fat dairy products, such as skim or 1% milk, 2% or reduced-fat cheeses, low-fat ricotta or cottage cheese, or plain low-fat yogurt. Low-sodium or reduced-sodium cheeses. Fats and Oils Tub margarines without trans fats. Light or reduced-fat mayonnaise and salad dressings (reduced sodium). Avocado. Safflower, olive, or canola oils. Natural peanut or almond butter. Other Unsalted popcorn and pretzels. The items listed above may not be a complete list of recommended foods or beverages. Contact your dietitian for more options. WHAT FOODS ARE NOT RECOMMENDED? Grains White bread. White pasta. White rice. Refined cornbread. Bagels and croissants. Crackers that contain trans fat. Vegetables Creamed or fried vegetables. Vegetables in a cheese sauce. Regular canned vegetables. Regular canned tomato sauce and paste. Regular tomato and vegetable juices. Fruits Dried fruits. Canned fruit in light or heavy syrup. Fruit juice. Meat and Other Protein Products Fatty cuts of meat. Ribs, chicken wings, bacon, sausage, bologna, salami, chitterlings, fatback, hot dogs, bratwurst, and packaged luncheon meats. Salted nuts and seeds. Canned beans with salt. Dairy Whole or 2% milk, cream, half-and-half, and cream cheese. Whole-fat or sweetened yogurt. Full-fat   cheeses or blue cheese. Nondairy creamers and whipped toppings. Processed cheese, cheese spreads, or cheese  curds. Condiments Onion and garlic salt, seasoned salt, table salt, and sea salt. Canned and packaged gravies. Worcestershire sauce. Tartar sauce. Barbecue sauce. Teriyaki sauce. Soy sauce, including reduced sodium. Steak sauce. Fish sauce. Oyster sauce. Cocktail sauce. Horseradish. Ketchup and mustard. Meat flavorings and tenderizers. Bouillon cubes. Hot sauce. Tabasco sauce. Marinades. Taco seasonings. Relishes. Fats and Oils Butter, stick margarine, lard, shortening, ghee, and bacon fat. Coconut, palm kernel, or palm oils. Regular salad dressings. Other Pickles and olives. Salted popcorn and pretzels. The items listed above may not be a complete list of foods and beverages to avoid. Contact your dietitian for more information. WHERE CAN I FIND MORE INFORMATION? National Heart, Lung, and Blood Institute: travelstabloid.com Document Released: 06/25/2011 Document Revised: 11/20/2013 Document Reviewed: 05/10/2013 [facility] Patient Information 2015 [place], [place]. This information is not intended to replace advice given to you by your health care provider. Make sure you discuss any questions you have with your health care provider. Oxygen Use at Home Oxygen can be prescribed for home use. The prescription will show the flow rate. This is how much oxygen is to be used per minute. This will be listed in liters per minute (LPM or L/M). A liter is a metric measurement of volume. You will use oxygen therapy as directed. It can be used while exercising, sleeping, or at rest. You may need oxygen continuously. Your health care provider may order a blood oxygen test (arterial blood gas or pulse oximetry test) that will show what your oxygen level is. Your health care provider will use these measurements to learn about your needs and follow your progress. Home oxygen therapy is commonly used on patients with various lung (pulmonary) related conditions. Some of these conditions  include:  Asthma.  Lung cancer.  Pneumonia.  Emphysema.  Chronic bronchitis.  Cystic fibrosis.  Other lung diseases.  Pulmonary fibrosis.  Occupational lung disease.  Heart failure.  Chronic obstructive pulmonary disease (COPD). 3 COMMON WAYS OF PROVIDING OXYGEN THERAPY  Gas: The gas form of oxygen is put into variously sized cylinders or tanks. The cylinders or oxygen tanks contain compressed oxygen. The cylinder is equipped with a regulator that controls the flow rate. Because the flow of oxygen out of the cylinder is constant, an oxygen conserving device may be attached to the system to avoid waste. This device releases the gas only when you inhale and cuts it off when you exhale. Oxygen can be provided in a small cylinder that can be carried with you. Large tanks are heavy and are only for stationary use. After use, empty tanks must be exchanged for full tanks.  Liquid: The liquid form of oxygen is put into a container similar to a thermos. When released, the liquid converts to a gas and you breathe it in just like the compressed gas. This storage method takes up less space than the compressed gas cylinder, and you can transfer the liquid to a small, portable vessel at home. Liquid oxygen is more expensive than the compressed gas, and the vessel vents when not in use. An oxygen conserving device may be built into the vessel to conserve the oxygen. Liquid oxygen is very cold, around 297 below zero.  Oxygen concentrator: This medical device filters oxygen from room air and gives almost 100% oxygen to the patient. Oxygen concentrators are powered by electricity. Benefits of this system are:  It does not need to be resupplied.  It is not as costly as liquid oxygen.  Extra tubing permits the user to move around easier. There are several types of small, portable oxygen systems available which can help you remain active and mobile. You must have a cylinder of oxygen as a backup in  the event of a power failure. Advise your electric power company that you are on oxygen therapy in order to get priority service when there is a power failure. OXYGEN DELIVERY DEVICES There are 3 common ways to deliver oxygen to your body.  Nasal cannula. This is a 2-pronged device inserted in the nostrils that is connected to tubing carrying the oxygen. The tubing can rest on the ears or be attached to the frame of eyeglasses.  Mask. People who need a high flow of oxygen generally use a mask.  Transtracheal catheter. Transtracheal oxygen therapy requires the insertion of a small, flexible tube (catheter) in the windpipe (trachea). This catheter is held in place by a necklace. Since transtracheal oxygen bypasses the mouth, nose, and throat, a humidifier is absolutely required at flow rates of 1 LPM or greater. OXYGEN USE SAFETY TIPS  Never smoke while using oxygen. Oxygen does not burn or explode, but flammable materials will burn faster in the presence of oxygen.  Keep a Data processing manager close by. Let your fire department know that you have oxygen in your home.  Warn visitors not to smoke near you when you are using oxygen. Put up "no smoking" signs in your home where you most often use the oxygen.  When you go to a restaurant with your portable oxygen source, ask to be seated in the nonsmoking section.  Stay at least 5 feet away from gas stoves, candles, lighted fireplaces, or other heat sources.  Do not use materials that burn easily (flammable) while using your oxygen.  If you use an oxygen cylinder, make sure it is secured to some fixed object or in a stand. If you use liquid oxygen, make sure the vessel is kept upright to keep the oxygen from pouring out. Liquid oxygen is so cold it can hurt your skin.  If you use an oxygen concentrator, call your electric company so you will be given priority service if your power goes out. Avoid using extension cords, if possible.  Regularly test  your smoke detectors at home to make sure they work. If you receive care in your home from a nurse or other health care provider, he or she may also check to make sure your smoke detectors work. GUIDELINES FOR CLEANING YOUR EQUIPMENT  Wash the nasal prongs with a liquid soap. Thoroughly rinse them once or twice a week.  Replace the prongs every 2 to 4 weeks. If you have an infection (cold, pneumonia) change them when you are well.  Your health care provider will give you instructions on how to clean your transtracheal catheter.  The humidifier bottle should be washed with soap and warm water and rinsed thoroughly between each refill. Air-dry the bottle before filling it with sterile or distilled water. The bottle and its top should be disinfected after they are cleaned.  If you use an oxygen concentrator, unplug the unit. Then wipe down the cabinet with a damp cloth and dry it daily. The air filter should be cleaned at least twice a week.  Follow your home medical equipment and service company's directions for cleaning the compressor filter. HOME CARE INSTRUCTIONS   Do not change the flow of oxygen unless directed by your  health care provider.  Do not use alcohol or other sedating drugs unless instructed. They slow your breathing rate.  Do not use materials that burn easily (flammable) while using your oxygen.  Always keep a spare tank of oxygen. Plan ahead for holidays when you may not be able to get a prescription filled.  Use water-based lubricants on your lips or nostrils. Do not use an oil-based product like petroleum jelly.  To prevent your cheeks or the skin behind your ears from becoming irritated, tuck some gauze under the tubing.  If you have persistent redness under your nose, call your health care provider.  When you no longer need oxygen, your doctor will have the oxygen discontinued. Oxygen is not addicting or habit forming.  Use the oxygen as instructed. Too much oxygen  can be harmful and too little will not give you the benefit you need.  Shortness of breath is not always from a lack of oxygen. If your oxygen level is not the cause of your shortness of breath, taking oxygen will not help. SEEK MEDICAL CARE IF:   You have frequent headaches.  You have shortness of breath or a lasting cough.  You have anxiety.  You are confused.  You are drowsy or sleepy all the time.  You develop an illness which aggravates your breathing.  You cannot exercise.  You are restless.  You have blue lips or fingernails.  You have difficult or irregular breathing and it is getting worse.  You have a fever. Document Released: 09/26/2003 Document Revised: 11/20/2013 Document Reviewed: 02/15/2013 [facility] Patient Information 2015 [place], [place]. This information is not intended to replace advice given to you by your health care provider. Make sure you discuss any questions you have with your health care provider.

## 2014-04-13 NOTE — Progress Notes (Signed)
Subjective:    Patient ID: Pamela [name], female    DOB: 06/06/1951, 63 y.o.   MRN: [id number]  HPI Pamela [name] is a 63 year old female that presents with a history of COPD, sarcoidosis, morbid obesity, and right upper lobe carcinoma accompanied by her daughter-in law.   She recently re-located from [place], [place], where where she was followed by [facility], Dr. Dion Saucier. She also states that she was followed by an oncologist, Dr. Rob Hickman for lung cancer. She states that she is currently in remission.   Pamela [name] has a history of sarcoidosis. She is presently controlled on steroid therapy. She states that last CT scan was in April 2015, which was stable. She states that her CT has been unchanged since 2014. She currently has shortness of breath on exertion. She is on continuous oxygen therapy at 2 L. She denies headache, fatigue, dizziness, chest pains, wheezing, fever, hemoptysis, or tachypnea.   She has a history of obstructive sleep apnea. She sleeps with a CPAP. She states that she has not had a sleep study in years. CPAP is currently not monitored.   Past Medical History  Diagnosis Date  . CHF (congestive heart failure)   . Asthma   . COPD (chronic obstructive pulmonary disease)   . Arthritis   . Sarcoidosis   . Cancer     lung  . Hypertension   . Sleep apnea   . Oxygen deficiency     Review of Systems  Constitutional: Positive for unexpected weight change. Negative for fever, diaphoresis and appetite change.  Eyes: Negative.   Respiratory: Positive for apnea (sleeps with CPAP), cough and shortness of breath.   Cardiovascular: Positive for leg swelling (bilateral ankle edema).  Gastrointestinal: Negative.   Endocrine: Negative.   Genitourinary: Negative.   Musculoskeletal: Negative.   Skin: Negative.   Allergic/Immunologic: Negative.   Neurological: Negative.   Hematological: Negative.   Psychiatric/Behavioral: Negative.        Objective:    Physical Exam  Constitutional: She is oriented to person, place, and time. She appears well-developed and well-nourished.  Non-toxic appearance. She does not have a sickly appearance. No distress.  HENT:  Head: Atraumatic.  Right Ear: Hearing, tympanic membrane, external ear and ear canal normal.  Left Ear: Hearing, tympanic membrane, external ear and ear canal normal.  Nose: Nose normal.  Mouth/Throat: Uvula is midline, oropharynx is clear and moist and mucous membranes are normal. Abnormal dentition (partially edentulous). No dental caries.  Eyes: Conjunctivae, EOM and lids are normal. Pupils are equal, round, and reactive to light.  Neck: Trachea normal and normal range of motion. Neck supple.  Cardiovascular: Normal rate, regular rhythm, normal heart sounds, intact distal pulses and normal pulses.  Exam reveals no decreased pulses.   Pulses:      Carotid pulses are 2+ on the right side, and 2+ on the left side.      Radial pulses are 2+ on the right side, and 2+ on the left side.       Femoral pulses are 2+ on the right side, and 2+ on the left side.      Popliteal pulses are 2+ on the right side, and 2+ on the left side.       Dorsalis pedis pulses are 2+ on the right side, and 2+ on the left side.       Posterior tibial pulses are 2+ on the right side, and 2+ on the left side.  Bilateral lower extremity edema  Pulmonary/Chest: No respiratory distress. She has no wheezes. She has no rales. She exhibits no tenderness.  Abdominal: She exhibits distension (obese).  Neurological: She is alert and oriented to person, place, and time. She has normal reflexes.  Skin: Skin is warm, dry and intact. No rash noted. No cyanosis. Nails show no clubbing.  Bilateral lower extremity hyperpigmentation and edema         BP 127/51  Pulse 79  Temp(Src) 97.9 F (36.6 C) (Oral)  Resp 18  Ht 5\' 5"  (1.651 m)  Wt 360 lb (163.295 kg)  BMI 59.91 kg/m2 Assessment & Plan:  1. Congestive heart  failure, unspecified Will review medical records as they become available.  - Pro b natriuretic peptide - Ambulatory referral to Pulmonology  2. Prediabetes Recommend a low carbohydrate, low fat diet divided over 6 small meals.  - Hemoglobin A1c  3. Unspecified essential hypertension Stable on current medication regimen. Will check labs - CBC with Differential - COMPLETE METABOLIC PANEL WITH GFR - Lipid Panel - Urinalysis, Complete  4. Bilateral edema of lower extremity Stable on Furosemide 40 mg twice daily.   5. History of lung cancer Reviewed  CT of chest with contrast. Chest CT stable, unchanged in appearance since 05/17/2013 with a radiation-induced changes of mild fibrosis in the right upper lobe and a 4 mm per fissural nodule  6. H/O sleep apnea  - Ambulatory referral to Pulmonology - Split night study; Future  7. Need for prophylactic vaccination and inoculation against influenza - Pneumococcal polysaccharide vaccine 23-valent greater than or equal to 2yo subcutaneous/IM  8. Immunization due - Pneumococcal polysaccharide vaccine 23-valent greater than or equal to 2yo subcutaneous/IM - Flu Vaccine QUAD 36+ mos PF IM (Fluarix Quad PF)  9. COPD, severity to be determined Reviewed most recent spirometry from Dr. Dion Saucier at [facility] pulmonary and critical care. There is a moderate obstructive lung defect. The airway obstruction is confirmed by the decrease in flow at 25%, 50, and 75% of the flow volume curve Patient is on daily prednisone therapy for COPD. She reports that she has been on therapy for years.  - Ambulatory referral to Pulmonology  10. On home oxygen therapy Patient desaturates on room air. Continuous oxygen at 2 Liters. Oxygen will be monitored by [facility]. Current order written.  - Check Pulse Oximetry while ambulating  11. Morbid obesity Patient reports that she is currently eating a balanced diet. She reports that she has dropped a significant      Preventative:  Patient is unsure of whether she is up to date on Tetanus vaccination. She states that she will bring vaccination records. Administered influenza and pneumococcal vaccination today, without complication.  Mammogram up to date.  Not current on pap/pelvic examination   RTC: 1 month with Dr. [name] for edema, htn, morbid obesity  Dorena Dew, FNP

## 2014-04-14 LAB — CBC WITH DIFFERENTIAL/PLATELET
Basophils Absolute: 0 10*3/uL (ref 0.0–0.1)
Basophils Relative: 0 % (ref 0–1)
EOS PCT: 11 % — AB (ref 0–5)
Eosinophils Absolute: 0.8 10*3/uL — ABNORMAL HIGH (ref 0.0–0.7)
HCT: 33.8 % — ABNORMAL LOW (ref 36.0–46.0)
Hemoglobin: 11.1 g/dL — ABNORMAL LOW (ref 12.0–15.0)
LYMPHS ABS: 1.4 10*3/uL (ref 0.7–4.0)
Lymphocytes Relative: 20 % (ref 12–46)
MCH: 21.9 pg — AB (ref 26.0–34.0)
MCHC: 32.8 g/dL (ref 30.0–36.0)
MCV: 66.7 fL — AB (ref 78.0–100.0)
MONOS PCT: 6 % (ref 3–12)
Monocytes Absolute: 0.4 10*3/uL (ref 0.1–1.0)
Neutro Abs: 4.3 10*3/uL (ref 1.7–7.7)
Neutrophils Relative %: 63 % (ref 43–77)
Platelets: 189 10*3/uL (ref 150–400)
RBC: 5.07 MIL/uL (ref 3.87–5.11)
RDW: 16.1 % — ABNORMAL HIGH (ref 11.5–15.5)
WBC: 6.9 10*3/uL (ref 4.0–10.5)

## 2014-04-14 LAB — URINALYSIS, COMPLETE
Bacteria, UA: NONE SEEN
Bilirubin Urine: NEGATIVE
Casts: NONE SEEN
Crystals: NONE SEEN
Glucose, UA: NEGATIVE mg/dL
Hgb urine dipstick: NEGATIVE
Ketones, ur: NEGATIVE mg/dL
LEUKOCYTES UA: NEGATIVE
NITRITE: NEGATIVE
PROTEIN: NEGATIVE mg/dL
Specific Gravity, Urine: 1.01 (ref 1.005–1.030)
Squamous Epithelial / LPF: NONE SEEN
Urobilinogen, UA: 0.2 mg/dL (ref 0.0–1.0)
pH: 6.5 (ref 5.0–8.0)

## 2014-04-14 LAB — HEMOGLOBIN A1C
Hgb A1c MFr Bld: 6.4 % — ABNORMAL HIGH (ref <5.7)
Mean Plasma Glucose: 137 mg/dL — ABNORMAL HIGH (ref <117)

## 2014-04-14 LAB — PRO B NATRIURETIC PEPTIDE: Pro B Natriuretic peptide (BNP): 1144 pg/mL — ABNORMAL HIGH (ref <126)

## 2014-04-18 DIAGNOSIS — Z23 Encounter for immunization: Secondary | ICD-10-CM | POA: Insufficient documentation

## 2014-04-18 DIAGNOSIS — Z9981 Dependence on supplemental oxygen: Secondary | ICD-10-CM | POA: Insufficient documentation

## 2014-04-19 ENCOUNTER — Telehealth: Payer: Self-pay | Admitting: Family Medicine

## 2014-04-19 DIAGNOSIS — Z85118 Personal history of other malignant neoplasm of bronchus and lung: Secondary | ICD-10-CM

## 2014-04-19 DIAGNOSIS — I10 Essential (primary) hypertension: Secondary | ICD-10-CM

## 2014-04-19 DIAGNOSIS — Z8669 Personal history of other diseases of the nervous system and sense organs: Secondary | ICD-10-CM

## 2014-04-19 DIAGNOSIS — E876 Hypokalemia: Secondary | ICD-10-CM

## 2014-04-19 DIAGNOSIS — D638 Anemia in other chronic diseases classified elsewhere: Secondary | ICD-10-CM | POA: Insufficient documentation

## 2014-04-19 DIAGNOSIS — R600 Localized edema: Secondary | ICD-10-CM

## 2014-04-19 DIAGNOSIS — I509 Heart failure, unspecified: Secondary | ICD-10-CM

## 2014-04-19 DIAGNOSIS — R7303 Prediabetes: Secondary | ICD-10-CM

## 2014-04-19 DIAGNOSIS — Z9981 Dependence on supplemental oxygen: Secondary | ICD-10-CM

## 2014-04-24 NOTE — Telephone Encounter (Signed)
Reviewed lab results. Referral sent to [facility] during office visit for history of COPD and home oxygen therapy. Also, will send to cardiology for history of congestive heart failure with elevated BNP.

## 2014-04-27 ENCOUNTER — Ambulatory Visit (INDEPENDENT_AMBULATORY_CARE_PROVIDER_SITE_OTHER): Payer: Medicare Other | Admitting: Internal Medicine

## 2014-04-27 ENCOUNTER — Encounter: Payer: Self-pay | Admitting: Internal Medicine

## 2014-04-27 VITALS — BP 104/60 | HR 64 | Temp 98.8°F | Ht 66.0 in | Wt 357.0 lb

## 2014-04-27 DIAGNOSIS — J4530 Mild persistent asthma, uncomplicated: Secondary | ICD-10-CM

## 2014-04-27 DIAGNOSIS — J45909 Unspecified asthma, uncomplicated: Secondary | ICD-10-CM | POA: Insufficient documentation

## 2014-04-27 MED ORDER — MOMETASONE FURO-FORMOTEROL FUM 100-5 MCG/ACT IN AERO: INHALATION_SPRAY | RESPIRATORY_TRACT | Status: DC

## 2014-04-27 NOTE — Progress Notes (Signed)
   Subjective:    Patient ID: Pamela [name], female    DOB: 1950/12/11  MRN: [id number]  HPI  12 yobf never smoker with 1st asthma attack in 1990s and on maint rx since late 90's maint rx and freq courses of prednisone 3-4 x per year despite advair and spiriva and freq saba referred to pulmonary clinic 04/27/2014 by Pamela [name]  04/27/2014 1st [facility] office visit/ Pamela [name]   Chief Complaint  Patient presents with  . Pulmonary Consult    Referred by Pamela [name]. [name]  Pt recently moved to [place] from [name] and states needs to establish with new pulmonary MD.  She c/o DOE and cough, and states that she feels these symptoms are currently under control.    on prednisone 10 mg daily  since late July 2015 and still on freq saba and 2lpm   Already used   2puffs proair am of ov   No obvious  patterns in day to day or daytime variabilty or assoc excess or purulent sputum or cp or chest tightness, subjective wheeze overt sinus or hb symptoms. No unusual exp hx or h/o childhood pna/ asthma or knowledge of premature birth.  Sleeping ok without nocturnal  or early am exacerbation  of respiratory  c/o's or need for noct saba. Also denies any obvious fluctuation of symptoms with weather or environmental changes or other aggravating or alleviating factors except as outlined above   Current Medications, Allergies, Complete Past Medical History, Past Surgical History, Family History, and Social History were reviewed in [employer] record.            Review of Systems  Constitutional: Negative for fever, chills and unexpected weight change.  HENT: Negative for congestion, dental problem, ear pain, nosebleeds, postnasal drip, rhinorrhea, sinus pressure, sneezing, sore throat, trouble swallowing and voice change.   Eyes: Negative for visual disturbance.  Respiratory: Positive for cough and shortness of breath. Negative  for choking.   Cardiovascular: Positive for leg swelling. Negative for chest pain.  Gastrointestinal: Negative for vomiting, abdominal pain and diarrhea.  Genitourinary: Negative for difficulty urinating.  Musculoskeletal: Negative for arthralgias.  Skin: Negative for rash.  Neurological: Negative for tremors, syncope and headaches.  Hematological: Does not bruise/bleed easily.       Objective:   Physical Exam  amb massively obese  Bf barely able to climb on exam table   Wt Readings from Last 3 Encounters:  04/27/14 357 lb (161.934 kg)  04/13/14 360 lb (163.295 kg)      HEENT: nl dentition, turbinates, and orophanx. Nl external ear canals without cough reflex   NECK :  without JVD/Nodes/TM/ nl carotid upstrokes bilaterally   LUNGS: no acc muscle use, clear to A and P bilaterally without cough on insp or exp maneuvers   CV:  RRR  no s3 or murmur or increase in P2, no edema   ABD:  soft and nontender with nl excursion in the supine position. No bruits or organomegaly, bowel sounds nl  MS:  warm without deformities, calf tenderness, cyanosis or clubbing  SKIN: warm and dry without lesions    NEURO:  alert, approp, no deficits        Assessment & Plan:

## 2014-04-27 NOTE — Patient Instructions (Addendum)
Stop spiriva and advair  Start dulera 100 Take 2 puffs first thing in am and then another 2 puffs about 12 hours later.   Work on inhaler technique:  relax and gently blow all the way out then take a nice smooth deep breath back in, triggering the inhaler at same time you start breathing in.  Hold for up to 5 seconds if you can.  Rinse and gargle with water when done      Only use your albuterol (proair) as a rescue medication to be used if you can't catch your breath by resting or doing a relaxed purse lip breathing pattern.  - The less you use it, the better it will work when you need it. - Ok to use up to 2 puffs  every 4 hours if you must but call for immediate appointment if use goes up over your usual need - Don't leave home without it !!  (think of it like the spare tire for your car)   If proair not helping, then use the neb and if needing the neb more than occastional, then take prednisone 10 mg daily   Please schedule a follow up office visit in 4 weeks, sooner if needed with all inhalers in hand for pfts on return

## 2014-04-29 NOTE — Assessment & Plan Note (Addendum)
pfts 07/31/13  FEV1  1.53 (58%) with ratio 66 > p saba ratio 74 FEV1 1.68 (64%)  - -04/27/2014 p extensive coaching HFA effectiveness =    75% > try dulera 100 2 bid   She barely has any detectable airflow obst p saba so this is asthma, not copd, and looks worse than it really is due to restrictive effects of obesity. In any case, Symptoms are markedly disproportionate to objective findings and not clear this is a lung problem but pt does appear to have difficult airway management issues.   DDX of  difficult airways management all start with A and  include Adherence, Ace Inhibitors, Acid Reflux, Active Sinus Disease, Alpha 1 Antitripsin deficiency, Anxiety masquerading as Airways dz,  ABPA,  allergy(esp in young), Aspiration (esp in elderly), Adverse effects of DPI,  Active smokers, plus two Bs  = Bronchiectasis and Beta blocker use..and one C= CHF   Adherence is always the initial "prime suspect" and is a multilayered concern that requires a "trust but verify" approach in every patient - starting with knowing how to use medications, especially inhalers, correctly, keeping up with refills and understanding the fundamental difference between maintenance and prns vs those medications only taken for a very short course and then stopped and not refilled.  The proper method of use, as well as anticipated side effects, of a metered-dose inhaler are discussed and demonstrated to the patient. Improved effectiveness after extensive coaching during this visit to a level of approximately  75% so try dulera 100 2bid x 4 week samples then regroup.  ? Adverse effect of dpi > try off advair, doesn't need spiriva   ? chf  Lab Results  Component Value Date   PROBNP 1144.00* 04/13/2014   need to keep cardiac asthma in ddx     Each maintenance medication was reviewed in detail including most importantly the difference between maintenance and as needed and under what circumstances the prns are to be used.  Please see  instructions for details which were reviewed in writing and the patient given a copy.

## 2014-05-07 ENCOUNTER — Ambulatory Visit: Payer: Medicare Other | Admitting: Family Medicine

## 2014-05-07 DIAGNOSIS — M109 Gout, unspecified: Secondary | ICD-10-CM | POA: Diagnosis not present

## 2014-05-07 DIAGNOSIS — M19072 Primary osteoarthritis, left ankle and foot: Secondary | ICD-10-CM | POA: Diagnosis not present

## 2014-05-16 ENCOUNTER — Ambulatory Visit: Payer: Medicare Other | Admitting: Internal Medicine

## 2014-05-21 ENCOUNTER — Encounter: Payer: Self-pay | Admitting: Internal Medicine

## 2014-05-21 ENCOUNTER — Other Ambulatory Visit: Payer: Self-pay | Admitting: Internal Medicine

## 2014-05-21 ENCOUNTER — Ambulatory Visit (INDEPENDENT_AMBULATORY_CARE_PROVIDER_SITE_OTHER): Payer: Medicare Other | Admitting: Internal Medicine

## 2014-05-21 VITALS — BP 107/54 | HR 104 | Temp 98.5°F | Resp 18 | Ht 66.0 in | Wt 358.0 lb

## 2014-05-21 DIAGNOSIS — Z8639 Personal history of other endocrine, nutritional and metabolic disease: Secondary | ICD-10-CM | POA: Diagnosis not present

## 2014-05-21 DIAGNOSIS — R000 Tachycardia, unspecified: Secondary | ICD-10-CM | POA: Diagnosis not present

## 2014-05-21 DIAGNOSIS — Z8739 Personal history of other diseases of the musculoskeletal system and connective tissue: Secondary | ICD-10-CM

## 2014-05-21 DIAGNOSIS — R42 Dizziness and giddiness: Secondary | ICD-10-CM

## 2014-05-21 DIAGNOSIS — I509 Heart failure, unspecified: Secondary | ICD-10-CM

## 2014-05-21 DIAGNOSIS — Z23 Encounter for immunization: Secondary | ICD-10-CM | POA: Diagnosis not present

## 2014-05-21 DIAGNOSIS — E8881 Metabolic syndrome: Secondary | ICD-10-CM

## 2014-05-21 DIAGNOSIS — I952 Hypotension due to drugs: Secondary | ICD-10-CM | POA: Diagnosis not present

## 2014-05-21 DIAGNOSIS — N183 Chronic kidney disease, stage 3 unspecified: Secondary | ICD-10-CM

## 2014-05-21 DIAGNOSIS — T887XXA Unspecified adverse effect of drug or medicament, initial encounter: Secondary | ICD-10-CM

## 2014-05-21 DIAGNOSIS — T50905A Adverse effect of unspecified drugs, medicaments and biological substances, initial encounter: Secondary | ICD-10-CM

## 2014-05-21 LAB — CBC WITH DIFFERENTIAL/PLATELET
Basophils Absolute: 0 10*3/uL (ref 0.0–0.1)
Basophils Relative: 0 % (ref 0–1)
EOS ABS: 0.7 10*3/uL (ref 0.0–0.7)
EOS PCT: 9 % — AB (ref 0–5)
HCT: 30.9 % — ABNORMAL LOW (ref 36.0–46.0)
HEMOGLOBIN: 10.2 g/dL — AB (ref 12.0–15.0)
LYMPHS ABS: 1.4 10*3/uL (ref 0.7–4.0)
Lymphocytes Relative: 19 % (ref 12–46)
MCH: 21.7 pg — ABNORMAL LOW (ref 26.0–34.0)
MCHC: 33 g/dL (ref 30.0–36.0)
MCV: 65.7 fL — AB (ref 78.0–100.0)
MONOS PCT: 9 % (ref 3–12)
Monocytes Absolute: 0.7 10*3/uL (ref 0.1–1.0)
Neutro Abs: 4.6 10*3/uL (ref 1.7–7.7)
Neutrophils Relative %: 63 % (ref 43–77)
Platelets: 284 10*3/uL (ref 150–400)
RBC: 4.7 MIL/uL (ref 3.87–5.11)
RDW: 15.4 % (ref 11.5–15.5)
WBC: 7.3 10*3/uL (ref 4.0–10.5)

## 2014-05-21 LAB — URIC ACID: Uric Acid, Serum: 16.1 mg/dL — ABNORMAL HIGH (ref 2.4–7.0)

## 2014-05-21 NOTE — Progress Notes (Signed)
Patient ID: Pamela [name], female   DOB: June 15, 1951, 63 y.o.   MRN: [id number]   Pamela [name], is a 63 y.o. female  AST:[id number]  [id number]:[id number]  DOB - May 01, 1951  CC:  Chief Complaint  Patient presents with  . Follow-up       HPI: Pamela [name] is a 63 y.o. female with a very complex medical history who is here for follow-up today. Since her last visit she was seen i Urgent Care for "gout" and was treated with a course of Indomethacin. She reports that the presentation was one of pain, swelling and redness of the MTP's of the left hand. She also has been taking oxycodone 5-10 mg q 4 hrs PRN for pain associated with "GOUT". Pt reports that no labs or radiologic studies were performed at the time of evaluation.  Today she presents for follow up and c/o dizziness with standing. On evaluation her BP is 107/54 and HR 104. She is on 3 diuretics (Lasix-daily and Aldactazide-PRN)which she was placed on by her PMD in [place]. She reports taking the Aldactazide today before coming to her visit.  She reports a h/o CHF but does not know whether systolic or diastolic dysfunction. She is not now or has been on a B-blocker, CCB or ACE-I- reasons unknown.   A review of her labs shows a GFR of 33 and Hb A1c of 6.4 with episodes of hyperglycemia.   Patient has No headache, No chest pain, No abdominal pain - No Nausea, No new weakness tingling or numbness, No Cough - SOB.  Allergies  Allergen Reactions  . Sulfur Rash   Past Medical History  Diagnosis Date  . CHF (congestive heart failure)   . Asthma   . COPD (chronic obstructive pulmonary disease)   . Arthritis   . Sarcoidosis   . Cancer     lung  . Hypertension   . Sleep apnea   . Oxygen deficiency    Current Outpatient Prescriptions on File Prior to Visit  Medication Sig Dispense Refill  . aspirin 81 MG tablet Take 81 mg by mouth daily.    . Calcium Carbonate-Vitamin D (CALTRATE 600+D PO) Take 1 tablet by mouth 1 day or 1 dose.    .  docusate sodium (COLACE) 100 MG capsule Take 100 mg by mouth 2 (two) times daily.    . furosemide (LASIX) 40 MG tablet Take 40 mg by mouth 2 (two) times daily.    [name] Kitchen ibuprofen (ADVIL,MOTRIN) 800 MG tablet Take 800 mg by mouth every 6 (six) hours as needed.    . meloxicam (MOBIC) 7.5 MG tablet Take 7.5 mg by mouth 2 (two) times daily.    . mometasone-formoterol (DULERA) 100-5 MCG/ACT AERO Take 2 puffs first thing in am and then another 2 puffs about 12 hours later.    [name] Kitchen Specialty Vitamins Products (MAGNESIUM, AMINO ACID CHELATE,) 133 MG tablet Take 1 tablet by mouth 2 (two) times daily.    [name] Kitchen spironolactone-hydrochlorothiazide (ALDACTAZIDE) 25-25 MG per tablet Take 1 tablet by mouth daily.     No current facility-administered medications on file prior to visit.   Family History  Problem Relation Age of Onset  . Hypertension Mother   . Renal Disease Mother   . Cancer Father   . Heart disease Father   . Cancer Brother   . Diabetes Brother   . Cervical cancer Sister   . Diabetes Sister   . Multiple myeloma Sister    History   Social History  .  Marital Status: Divorced    Spouse Name: N/A    Number of Children: N/A  . Years of Education: N/A   Occupational History  . Not on file.   Social History Main Topics  . Smoking status: Never Smoker   . Smokeless tobacco: Never Used  . Alcohol Use: No  . Drug Use: No  . Sexual Activity: Not on file   Other Topics Concern  . Not on file   Social History Narrative  . No narrative on file    Review of Systems: Constitutional: Negative for fever, chills, diaphoresis, activity change, appetite change and fatigue. HENT: Negative for ear pain, nosebleeds, congestion, facial swelling, rhinorrhea, neck pain, neck stiffness and ear discharge.  Eyes: Negative for pain, discharge, redness, itching and visual disturbance. Respiratory: Negative for cough, choking, chest tightness, shortness of breath, wheezing and stridor.  Cardiovascular:  Negative for chest pain, palpitations and leg swelling. Gastrointestinal: Negative for abdominal distention. Genitourinary: Negative for dysuria, urgency, frequency, hematuria, flank pain, decreased urine volume, difficulty urinating and dyspareunia.  Musculoskeletal: Negative for back pain. Neurological: Negative for dizziness, tremors, seizures, syncope, facial asymmetry, speech difficulty, weakness, light-headedness, numbness and headaches.  Hematological: Negative for adenopathy. Does not bruise/bleed easily. Psychiatric/Behavioral: Negative for hallucinations, behavioral problems, confusion, dysphoric mood, decreased concentration and agitation.    Objective:    Filed Vitals:   05/21/14 1503  BP: 107/54  Pulse: 104  Temp: 98.5 F (36.9 C)  Resp: 18    Physical Exam: Constitutional: Patient appears morbidly obese well-developed and well-nourished. No distress. HENT: Normocephalic, atraumatic, External right and left ear normal. Oropharynx is clear and moist. Poor dentition Eyes: Conjunctivae and EOM are normal. PERRLA, no scleral icterus. Neck: Normal ROM. Neck supple. No JVD. No tracheal deviation. No thyromegaly. CVS: RRR, S1/S2 +, no murmurs, no gallops, no carotid bruit.  Pulmonary: Effort and breath sounds normal, no stridor, rhonchi, wheezes, rales.  Abdominal: Soft. BS +, no distension, tenderness, rebound or guarding.  Musculoskeletal: Normal range of motion. No edema and no tenderness.  Lymphadenopathy: No lymphadenopathy noted, cervical, inguinal or axillary Neuro: Alert. Normal reflexes, muscle tone coordination. No cranial nerve deficit. Skin: Skin is warm and dry. No rash noted. Not diaphoretic. No erythema. No pallor. Psychiatric: Normal mood and affect. Behavior, judgment, thought content normal.  Lab Results  Component Value Date   WBC 6.9 04/13/2014   HGB 11.1* 04/13/2014   HCT 33.8* 04/13/2014   MCV 66.7* 04/13/2014   PLT 189 04/13/2014   Lab Results   Component Value Date   CREATININE 1.88* 04/13/2014   BUN 34* 04/13/2014   NA 141 04/13/2014   K 3.3* 04/13/2014   CL 97 04/13/2014   CO2 31 04/13/2014    Lab Results  Component Value Date   HGBA1C 6.4* 04/13/2014   Lipid Panel     Component Value Date/Time   CHOL 171 04/13/2014 1234   TRIG 116 04/13/2014 1234   HDL 59 04/13/2014 1234   CHOLHDL 2.9 04/13/2014 1234   VLDL 23 04/13/2014 1234   LDLCALC 89 04/13/2014 1234       Assessment and plan:   1.Dizziness - Pt c/o dizziness upon standing  - Orthostatic vital signs ( mild increase in HR without any decrease in BP) BP improved since taken at beginning of visit.  - Hold Aldactazide and continue Lasix. Check daily weights and BP. (Pt's daughter-in-law is a Marine scientist).   2. HTN - Pt initially hypotensive but BP and HR improved at re-check (  BP-125/70, HR-81) which is still hypotensive for patient as pt's normal SBP is 170).  Will hold Aldactazide.    3. CRF (chronic renal failure), stage 3 (moderate) - Review of her records shows a GFR of 33 with Cr of 1.88. It is of concern that she has recently received Indomethacin in the setting of a markedly reduced GFR. Will obtain BMET with GFR and Urinalysis. - Basic Metabolic Panel with GFR - Urinalysis  4. Hypotension due to drugs - Likely Diuretic effect but Indomethacin may have a role also.  - EKG 12-Lead  5. Tachycardia - Likely a response to low BP. However, cannot ignore the possibility of Thyroid dysfunction - TSH - EKG 12-Lead  6. H/O: gout - Pt reports a h/o multiple gouty flares in the past. She denies ever being told of elevated uric acid in the past. Currently the MTP's do not have the characteristic appearance of gout, however it has been treated. If Uric Acid elevated may consider urate lowering medication. However given the significant side effect profile of Urate lowering medications and patient's renal dysfunction would likely need to make the diagnosis by joint  aspiration and examination of crystals before initiating colchicine for prophylaxis. - Uric Acid  7. Metabolic syndrome - Pt has elevated Hb A1c and obesity. Will discuss consideration of Glucophage at next visit. - Referral to Nutrition and Diabetes Services  8. Morbid obesity - Referral to Nutrition and Diabetes Services  9. Chronic congestive heart failure, unspecified congestive heart failure type - Pt reports CHF but is unable to tell if Diastolic or systolic dysfunction. Additionally her current medications do not point to one or the other. Her EKG shows some evidence of Atrial enlargement which may suggest diastolic dysfunction. Will obtain records of ECHO from her previous PMD/Cardiologist (Dr. Abousy [phone]) in [place]. - Obtain medical records  10.  Immunization due - Varicella-zoster vaccine subcutaneous    Follow-up 2 weeks for BP,  CRF, Review labs.  The patient was given clear instructions to go to ER or return to medical center if symptoms don't improve, worsen or new problems develop. The patient verbalized understanding. The patient was told to call to get lab results if they haven't heard anything in the next week.     This note has been created with Dragon speech recognition software and smart phrase technology. Any transcriptional errors are unintentional.    MATTHEWS,MICHELLE A., MD [facility] [place], [place] [phone]   05/21/2014, 4:08 PM  

## 2014-05-22 ENCOUNTER — Telehealth (HOSPITAL_COMMUNITY): Payer: Self-pay | Admitting: Hematology

## 2014-05-22 LAB — TSH: TSH: 3.934 u[IU]/mL (ref 0.350–4.500)

## 2014-05-22 LAB — URINALYSIS
Bilirubin Urine: NEGATIVE
Glucose, UA: NEGATIVE mg/dL
Hgb urine dipstick: NEGATIVE
Ketones, ur: NEGATIVE mg/dL
Leukocytes, UA: NEGATIVE
NITRITE: NEGATIVE
PH: 6.5 (ref 5.0–8.0)
Protein, ur: NEGATIVE mg/dL
SPECIFIC GRAVITY, URINE: 1.008 (ref 1.005–1.030)
UROBILINOGEN UA: 0.2 mg/dL (ref 0.0–1.0)

## 2014-05-22 LAB — BASIC METABOLIC PANEL WITH GFR

## 2014-05-22 NOTE — Telephone Encounter (Signed)
0840am Per Vaughan Basta at [place] lab, pt Uric Acid level is an "alert high" 16.1; Vaughan Basta stated lab result has been confirmed by automatic dilution. Dr. Liston Alba paged and notified via [place].

## 2014-05-23 ENCOUNTER — Inpatient Hospital Stay (HOSPITAL_COMMUNITY)
Admission: EM | Admit: 2014-05-23 | Discharge: 2014-05-28 | DRG: 554 | Disposition: A | Discharge status: Skilled Nursing Facility | Payer: Medicare Other | Attending: Internal Medicine | Admitting: Internal Medicine

## 2014-05-23 ENCOUNTER — Encounter (HOSPITAL_COMMUNITY): Payer: Self-pay | Admitting: Emergency Medicine

## 2014-05-23 DIAGNOSIS — D638 Anemia in other chronic diseases classified elsewhere: Secondary | ICD-10-CM | POA: Diagnosis present

## 2014-05-23 DIAGNOSIS — L309 Dermatitis, unspecified: Secondary | ICD-10-CM | POA: Diagnosis present

## 2014-05-23 DIAGNOSIS — D509 Iron deficiency anemia, unspecified: Secondary | ICD-10-CM | POA: Diagnosis present

## 2014-05-23 DIAGNOSIS — R600 Localized edema: Secondary | ICD-10-CM | POA: Diagnosis not present

## 2014-05-23 DIAGNOSIS — I509 Heart failure, unspecified: Secondary | ICD-10-CM | POA: Diagnosis present

## 2014-05-23 DIAGNOSIS — D649 Anemia, unspecified: Secondary | ICD-10-CM | POA: Diagnosis not present

## 2014-05-23 DIAGNOSIS — N19 Unspecified kidney failure: Secondary | ICD-10-CM | POA: Diagnosis not present

## 2014-05-23 DIAGNOSIS — N183 Chronic kidney disease, stage 3 (moderate): Secondary | ICD-10-CM | POA: Diagnosis present

## 2014-05-23 DIAGNOSIS — I129 Hypertensive chronic kidney disease with stage 1 through stage 4 chronic kidney disease, or unspecified chronic kidney disease: Secondary | ICD-10-CM | POA: Diagnosis present

## 2014-05-23 DIAGNOSIS — Z9981 Dependence on supplemental oxygen: Secondary | ICD-10-CM | POA: Diagnosis not present

## 2014-05-23 DIAGNOSIS — M1009 Idiopathic gout, multiple sites: Principal | ICD-10-CM | POA: Diagnosis present

## 2014-05-23 DIAGNOSIS — I369 Nonrheumatic tricuspid valve disorder, unspecified: Secondary | ICD-10-CM | POA: Diagnosis not present

## 2014-05-23 DIAGNOSIS — I5032 Chronic diastolic (congestive) heart failure: Secondary | ICD-10-CM

## 2014-05-23 DIAGNOSIS — M79642 Pain in left hand: Secondary | ICD-10-CM | POA: Diagnosis not present

## 2014-05-23 DIAGNOSIS — Z79899 Other long term (current) drug therapy: Secondary | ICD-10-CM | POA: Diagnosis not present

## 2014-05-23 DIAGNOSIS — M1990 Unspecified osteoarthritis, unspecified site: Secondary | ICD-10-CM | POA: Diagnosis not present

## 2014-05-23 DIAGNOSIS — M1000 Idiopathic gout, unspecified site: Secondary | ICD-10-CM | POA: Diagnosis not present

## 2014-05-23 DIAGNOSIS — M2500 Hemarthrosis, unspecified joint: Secondary | ICD-10-CM | POA: Diagnosis not present

## 2014-05-23 DIAGNOSIS — Z7982 Long term (current) use of aspirin: Secondary | ICD-10-CM | POA: Diagnosis not present

## 2014-05-23 DIAGNOSIS — J449 Chronic obstructive pulmonary disease, unspecified: Secondary | ICD-10-CM | POA: Diagnosis present

## 2014-05-23 DIAGNOSIS — C3490 Malignant neoplasm of unspecified part of unspecified bronchus or lung: Secondary | ICD-10-CM | POA: Diagnosis not present

## 2014-05-23 DIAGNOSIS — M19041 Primary osteoarthritis, right hand: Secondary | ICD-10-CM | POA: Diagnosis not present

## 2014-05-23 DIAGNOSIS — Q600 Renal agenesis, unilateral: Secondary | ICD-10-CM

## 2014-05-23 DIAGNOSIS — T39315S Adverse effect of propionic acid derivatives, sequela: Secondary | ICD-10-CM

## 2014-05-23 DIAGNOSIS — I5031 Acute diastolic (congestive) heart failure: Secondary | ICD-10-CM | POA: Diagnosis not present

## 2014-05-23 DIAGNOSIS — Z923 Personal history of irradiation: Secondary | ICD-10-CM

## 2014-05-23 DIAGNOSIS — N179 Acute kidney failure, unspecified: Secondary | ICD-10-CM | POA: Diagnosis present

## 2014-05-23 DIAGNOSIS — J45909 Unspecified asthma, uncomplicated: Secondary | ICD-10-CM | POA: Diagnosis present

## 2014-05-23 DIAGNOSIS — J4520 Mild intermittent asthma, uncomplicated: Secondary | ICD-10-CM | POA: Diagnosis not present

## 2014-05-23 DIAGNOSIS — R7303 Prediabetes: Secondary | ICD-10-CM | POA: Diagnosis present

## 2014-05-23 DIAGNOSIS — R7309 Other abnormal glucose: Secondary | ICD-10-CM | POA: Diagnosis present

## 2014-05-23 DIAGNOSIS — M109 Gout, unspecified: Secondary | ICD-10-CM | POA: Diagnosis not present

## 2014-05-23 DIAGNOSIS — M2550 Pain in unspecified joint: Secondary | ICD-10-CM | POA: Diagnosis not present

## 2014-05-23 DIAGNOSIS — R52 Pain, unspecified: Secondary | ICD-10-CM

## 2014-05-23 DIAGNOSIS — D860 Sarcoidosis of lung: Secondary | ICD-10-CM | POA: Diagnosis not present

## 2014-05-23 DIAGNOSIS — G4733 Obstructive sleep apnea (adult) (pediatric): Secondary | ICD-10-CM | POA: Diagnosis present

## 2014-05-23 DIAGNOSIS — E876 Hypokalemia: Secondary | ICD-10-CM | POA: Diagnosis present

## 2014-05-23 DIAGNOSIS — D869 Sarcoidosis, unspecified: Secondary | ICD-10-CM

## 2014-05-23 DIAGNOSIS — M1711 Unilateral primary osteoarthritis, right knee: Secondary | ICD-10-CM | POA: Diagnosis not present

## 2014-05-23 DIAGNOSIS — I10 Essential (primary) hypertension: Secondary | ICD-10-CM | POA: Diagnosis present

## 2014-05-23 DIAGNOSIS — Z6841 Body Mass Index (BMI) 40.0 and over, adult: Secondary | ICD-10-CM | POA: Diagnosis not present

## 2014-05-23 DIAGNOSIS — Z85118 Personal history of other malignant neoplasm of bronchus and lung: Secondary | ICD-10-CM | POA: Diagnosis not present

## 2014-05-23 DIAGNOSIS — Z7952 Long term (current) use of systemic steroids: Secondary | ICD-10-CM | POA: Diagnosis not present

## 2014-05-23 MED ORDER — SODIUM CHLORIDE 0.9 % IV SOLN
Freq: Once | INTRAVENOUS | Status: AC
  Administered 2014-05-24: via INTRAVENOUS

## 2014-05-23 MED ORDER — MORPHINE SULFATE 4 MG/ML IJ SOLN
4.0000 mg | Freq: Once | INTRAMUSCULAR | Status: AC
  Administered 2014-05-24: 4 mg via INTRAVENOUS
  Filled 2014-05-23: qty 1

## 2014-05-23 NOTE — ED Notes (Signed)
Pt c/o pain to R hand, knees, feet. Pt had a was seen by her PCP this week for same. Pt has been taking pains meds without relief. Pt states she has finished course of Indomethacin, Uric Acid level 16.1 on 11/2. Pt is on continuous 02.

## 2014-05-23 NOTE — ED Provider Notes (Signed)
CSN: [id number]     Arrival date & time 05/23/14  2218 History   First MD Initiated Contact with Patient 05/23/14 2326     Chief Complaint  Patient presents with  . Hand Pain   HPI This chart was scribed for non-physician practitioner, Charlann Lange PA-C working with Julianne Rice, MD, by Thea Alken, ED Scribe. This patient was seen in room WA03/WA03 and the patient's care was started at 11:29 PM.  Pamela [name] is a 63 y.o. female who presents to the Emergency Department complaining of worsening arthralgias  pain. Pt states pain initially started in left hand  3 weeks ago followed by knees and ankles and was seen at urgent care and was prescribed gout medication. Pt was seen  by PCP, Dr. Rodman Key 2 days ago. Pt reports nausea but denies emesis and fever.    Past Medical History  Diagnosis Date  . CHF (congestive heart failure)   . Asthma   . COPD (chronic obstructive pulmonary disease)   . Arthritis   . Sarcoidosis   . Cancer     lung  . Hypertension   . Sleep apnea   . Oxygen deficiency    Past Surgical History  Procedure Laterality Date  . Lung biopsy    . Tubal ligation    . Vein ligation and stripping     Family History  Problem Relation Age of Onset  . Hypertension Mother   . Renal Disease Mother   . Cancer Father   . Heart disease Father   . Cancer Brother   . Diabetes Brother   . Cervical cancer Sister   . Diabetes Sister   . Multiple myeloma Sister    History  Substance Use Topics  . Smoking status: Never Smoker   . Smokeless tobacco: Never Used  . Alcohol Use: No   OB History    No data available     Review of Systems  Gastrointestinal: Positive for nausea. Negative for vomiting.  Musculoskeletal: Positive for arthralgias.    Allergies  Sulfur  Home Medications   Prior to Admission medications   Medication Sig Start Date End Date Taking? Authorizing Provider  aspirin 81 MG tablet Take 81 mg by mouth daily.   Yes Historical Provider, MD   Calcium Carbonate-Vitamin D (CALTRATE 600+D PO) Take 1 tablet by mouth 1 day or 1 dose.   Yes Historical Provider, MD  docusate sodium (COLACE) 100 MG capsule Take 100 mg by mouth 2 (two) times daily.   Yes Historical Provider, MD  furosemide (LASIX) 40 MG tablet Take 40 mg by mouth 2 (two) times daily.   Yes Historical Provider, MD  meloxicam (MOBIC) 7.5 MG tablet Take 7.5 mg by mouth 2 (two) times daily.   Yes Historical Provider, MD  mometasone-formoterol (DULERA) 100-5 MCG/ACT AERO Take 2 puffs first thing in am and then another 2 puffs about 12 hours later. 04/27/14  Yes Tanda Rockers, MD  OVER THE COUNTER MEDICATION Take 1,000 mg by mouth daily.   Yes Historical Provider, MD  oxycodone (OXY-IR) 5 MG capsule Take 5-10 mg by mouth every 4 (four) hours as needed for pain.    Yes Historical Provider, MD  Specialty Vitamins Products (MAGNESIUM, AMINO ACID CHELATE,) 133 MG tablet Take 1 tablet by mouth 2 (two) times daily.   Yes Historical Provider, MD  ibuprofen (ADVIL,MOTRIN) 800 MG tablet Take 800 mg by mouth every 6 (six) hours as needed.    Historical Provider, MD  spironolactone-hydrochlorothiazide Edmonia Lynch)  25-25 MG per tablet Take 1 tablet by mouth daily.    Historical Provider, MD   BP 151/64 mmHg  Pulse 86  Temp(Src) 99.9 F (37.7 C) (Oral)  Resp 18  Ht _0  (1.676 m)  Wt 257 lb (116.574 kg)  BMI 41.50 kg/m2  SpO2 97% Physical Exam  Constitutional: She is oriented to person, place, and time. She appears well-developed and well-nourished. No distress.  HENT:  Head: Normocephalic and atraumatic.  Eyes: Conjunctivae and EOM are normal.  Neck: Neck supple.  Cardiovascular: Normal rate.   Pulmonary/Chest: Effort normal. She exhibits no tenderness.  No chest tenderness  Abdominal: There is no tenderness.  Morbidly obese abdomen.  Musculoskeletal: Normal range of motion.  Chronic swelling at the ankle without warmth. No lesions or ulcerations noted to feet, including  interdigitally. Chronic changes of venous stasis bilateral LE's. Knees tender bilaterally but are without visualized swelling, redness or warmth.  Left greater than right hand swelling without redness or warmth. Significant tenderness bilaterally of all joints of hands and wrists.   Neurological: She is alert and oriented to person, place, and time.  Skin: Skin is warm and dry.  Psychiatric: She has a normal mood and affect. Her behavior is normal.  Nursing note and vitals reviewed.   ED Course  Procedures (including critical care time) DIAGNOSTIC STUDIES: Oxygen Saturation is 97% on RA, normal by my interpretation.    COORDINATION OF CARE: 11:57 PM- Pt advised of plan for treatment and pt agrees.  Labs Review Labs Reviewed - No data to display Results for orders placed or performed during the hospital encounter of 05/23/14  CBC with Differential  Result Value Ref Range   WBC 10.2 4.0 - 10.5 K/uL   RBC 4.20 3.87 - 5.11 MIL/uL   Hemoglobin 9.1 (L) 12.0 - 15.0 g/dL   HCT 28.4 (L) 36.0 - 46.0 %   MCV 67.6 (L) 78.0 - 100.0 fL   MCH 21.7 (L) 26.0 - 34.0 pg   MCHC 32.0 30.0 - 36.0 g/dL   RDW 14.7 11.5 - 15.5 %   Platelets 209 150 - 400 K/uL   Neutrophils Relative % 78 (H) 43 - 77 %   Lymphocytes Relative 10 (L) 12 - 46 %   Monocytes Relative 10 3 - 12 %   Eosinophils Relative 2 0 - 5 %   Basophils Relative 0 0 - 1 %   Neutro Abs 8.0 (H) 1.7 - 7.7 K/uL   Lymphs Abs 1.0 0.7 - 4.0 K/uL   Monocytes Absolute 1.0 0.1 - 1.0 K/uL   Eosinophils Absolute 0.2 0.0 - 0.7 K/uL   Basophils Absolute 0.0 0.0 - 0.1 K/uL   RBC Morphology POLYCHROMASIA PRESENT    WBC Morphology MILD LEFT SHIFT (1-5% METAS, OCC MYELO, OCC BANDS)   Comprehensive metabolic panel  Result Value Ref Range   Sodium 134 (L) 137 - 147 mEq/L   Potassium 3.3 (L) 3.7 - 5.3 mEq/L   Chloride 87 (L) 96 - 112 mEq/L   CO2 31 19 - 32 mEq/L   Glucose, Bld 123 (H) 70 - 99 mg/dL   BUN 45 (H) 6 - 23 mg/dL   Creatinine, Ser 2.96 (H)  0.50 - 1.10 mg/dL   Calcium 9.5 8.4 - 10.5 mg/dL   Total Protein 7.5 6.0 - 8.3 g/dL   Albumin 3.0 (L) 3.5 - 5.2 g/dL   AST 20 0 - 37 U/L   ALT 9 0 - 35 U/L   Alkaline Phosphatase  169 (H) 39 - 117 U/L   Total Bilirubin 0.8 0.3 - 1.2 mg/dL   GFR calc non Af Amer 16 (L) >90 mL/min   GFR calc Af Amer 19 (L) >90 mL/min   Anion gap 16 (H) 5 - 15  Uric acid  Result Value Ref Range   Uric Acid, Serum 17.5 (H) 2.4 - 7.0 mg/dL  Sedimentation rate  Result Value Ref Range   Sed Rate 83 (H) 0 - 22 mm/hr  Lactic acid, plasma  Result Value Ref Range   Lactic Acid, Venous 1.2 0.5 - 2.2 mmol/L   Dg Hand Complete Right  05/24/2014   CLINICAL DATA:  Increasing pain in the right hand over the past week. No known injury.  EXAM: RIGHT HAND - COMPLETE 3+ VIEW  COMPARISON:  None.  FINDINGS: Degenerative changes in the interphalangeal joints. Diffuse soft tissue swelling. Old ununited ossicle at the ulnar styloid process. Periarticular bone erosions are demonstrated at multiple proximal interphalangeal joints. Changes suggest erosive arthritis. No evidence of acute fracture or subluxation. Bone cortex and trabecular architecture appear intact. No radiopaque soft tissue foreign bodies.  IMPRESSION: Degenerative changes in the right hand. 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 Diffuse soft tissue swelling.   Electronically Signed   By: Lucienne Capers M.D.   On: 05/24/2014 03:44    Imaging Review No results found.   EKG Interpretation None      MDM   Final diagnoses:  None  1. Renal failure 2. Arthralgias 3. Anemia  Pain is difficult to manage in the ED despite multiple doses of pain medication. Renal function is elevated with Cr 2.96, last comparable one month ago 1.88. 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 With history of sarcoid, elevated renal function and persistent pain, will admit for further evaluation and management.  Discussed with [facility], Dr. Blaine Hamper, who accepts the patient for admission.  I personally performed the services described in this documentation, which was scribed in my presence. The recorded information has been reviewed and is accurate.      Dewaine Oats, PA-C 05/24/14 0413  Julianne Rice, MD 05/25/14 [phone]

## 2014-05-24 ENCOUNTER — Emergency Department (HOSPITAL_COMMUNITY): Payer: Medicare Other

## 2014-05-24 ENCOUNTER — Inpatient Hospital Stay (HOSPITAL_COMMUNITY): Payer: Medicare Other

## 2014-05-24 DIAGNOSIS — Z7982 Long term (current) use of aspirin: Secondary | ICD-10-CM | POA: Diagnosis not present

## 2014-05-24 DIAGNOSIS — Q600 Renal agenesis, unilateral: Secondary | ICD-10-CM | POA: Diagnosis not present

## 2014-05-24 DIAGNOSIS — M2550 Pain in unspecified joint: Secondary | ICD-10-CM | POA: Diagnosis not present

## 2014-05-24 DIAGNOSIS — D649 Anemia, unspecified: Secondary | ICD-10-CM | POA: Diagnosis not present

## 2014-05-24 DIAGNOSIS — D869 Sarcoidosis, unspecified: Secondary | ICD-10-CM | POA: Diagnosis not present

## 2014-05-24 DIAGNOSIS — I5032 Chronic diastolic (congestive) heart failure: Secondary | ICD-10-CM

## 2014-05-24 DIAGNOSIS — Z923 Personal history of irradiation: Secondary | ICD-10-CM | POA: Diagnosis not present

## 2014-05-24 DIAGNOSIS — M1990 Unspecified osteoarthritis, unspecified site: Secondary | ICD-10-CM | POA: Diagnosis not present

## 2014-05-24 DIAGNOSIS — I10 Essential (primary) hypertension: Secondary | ICD-10-CM

## 2014-05-24 DIAGNOSIS — C3490 Malignant neoplasm of unspecified part of unspecified bronchus or lung: Secondary | ICD-10-CM | POA: Diagnosis not present

## 2014-05-24 DIAGNOSIS — I369 Nonrheumatic tricuspid valve disorder, unspecified: Secondary | ICD-10-CM | POA: Diagnosis not present

## 2014-05-24 DIAGNOSIS — M2500 Hemarthrosis, unspecified joint: Secondary | ICD-10-CM | POA: Diagnosis not present

## 2014-05-24 DIAGNOSIS — J449 Chronic obstructive pulmonary disease, unspecified: Secondary | ICD-10-CM

## 2014-05-24 DIAGNOSIS — M19041 Primary osteoarthritis, right hand: Secondary | ICD-10-CM | POA: Diagnosis not present

## 2014-05-24 DIAGNOSIS — D860 Sarcoidosis of lung: Secondary | ICD-10-CM | POA: Diagnosis not present

## 2014-05-24 DIAGNOSIS — J4520 Mild intermittent asthma, uncomplicated: Secondary | ICD-10-CM | POA: Diagnosis not present

## 2014-05-24 DIAGNOSIS — M1711 Unilateral primary osteoarthritis, right knee: Secondary | ICD-10-CM | POA: Diagnosis not present

## 2014-05-24 DIAGNOSIS — R7309 Other abnormal glucose: Secondary | ICD-10-CM | POA: Diagnosis not present

## 2014-05-24 DIAGNOSIS — Z6841 Body Mass Index (BMI) 40.0 and over, adult: Secondary | ICD-10-CM | POA: Diagnosis not present

## 2014-05-24 DIAGNOSIS — R600 Localized edema: Secondary | ICD-10-CM | POA: Diagnosis not present

## 2014-05-24 DIAGNOSIS — E876 Hypokalemia: Secondary | ICD-10-CM | POA: Diagnosis present

## 2014-05-24 DIAGNOSIS — T39315S Adverse effect of propionic acid derivatives, sequela: Secondary | ICD-10-CM | POA: Diagnosis not present

## 2014-05-24 DIAGNOSIS — L309 Dermatitis, unspecified: Secondary | ICD-10-CM | POA: Diagnosis present

## 2014-05-24 DIAGNOSIS — N179 Acute kidney failure, unspecified: Secondary | ICD-10-CM | POA: Diagnosis not present

## 2014-05-24 DIAGNOSIS — J45909 Unspecified asthma, uncomplicated: Secondary | ICD-10-CM | POA: Diagnosis present

## 2014-05-24 DIAGNOSIS — M109 Gout, unspecified: Secondary | ICD-10-CM | POA: Diagnosis not present

## 2014-05-24 DIAGNOSIS — D638 Anemia in other chronic diseases classified elsewhere: Secondary | ICD-10-CM | POA: Diagnosis not present

## 2014-05-24 DIAGNOSIS — N183 Chronic kidney disease, stage 3 (moderate): Secondary | ICD-10-CM | POA: Diagnosis not present

## 2014-05-24 DIAGNOSIS — D509 Iron deficiency anemia, unspecified: Secondary | ICD-10-CM | POA: Diagnosis not present

## 2014-05-24 DIAGNOSIS — I509 Heart failure, unspecified: Secondary | ICD-10-CM | POA: Diagnosis present

## 2014-05-24 DIAGNOSIS — I129 Hypertensive chronic kidney disease with stage 1 through stage 4 chronic kidney disease, or unspecified chronic kidney disease: Secondary | ICD-10-CM | POA: Diagnosis present

## 2014-05-24 DIAGNOSIS — Z9981 Dependence on supplemental oxygen: Secondary | ICD-10-CM

## 2014-05-24 DIAGNOSIS — M1000 Idiopathic gout, unspecified site: Secondary | ICD-10-CM | POA: Diagnosis not present

## 2014-05-24 DIAGNOSIS — G4733 Obstructive sleep apnea (adult) (pediatric): Secondary | ICD-10-CM | POA: Diagnosis not present

## 2014-05-24 DIAGNOSIS — I5031 Acute diastolic (congestive) heart failure: Secondary | ICD-10-CM | POA: Diagnosis not present

## 2014-05-24 DIAGNOSIS — M1009 Idiopathic gout, multiple sites: Secondary | ICD-10-CM | POA: Diagnosis present

## 2014-05-24 DIAGNOSIS — Z7952 Long term (current) use of systemic steroids: Secondary | ICD-10-CM | POA: Diagnosis not present

## 2014-05-24 DIAGNOSIS — Z85118 Personal history of other malignant neoplasm of bronchus and lung: Secondary | ICD-10-CM | POA: Diagnosis not present

## 2014-05-24 DIAGNOSIS — Z79899 Other long term (current) drug therapy: Secondary | ICD-10-CM | POA: Diagnosis not present

## 2014-05-24 LAB — CBC WITH DIFFERENTIAL/PLATELET
Basophils Absolute: 0 10*3/uL (ref 0.0–0.1)
Basophils Relative: 0 % (ref 0–1)
EOS PCT: 2 % (ref 0–5)
Eosinophils Absolute: 0.2 10*3/uL (ref 0.0–0.7)
HCT: 28.4 % — ABNORMAL LOW (ref 36.0–46.0)
Hemoglobin: 9.1 g/dL — ABNORMAL LOW (ref 12.0–15.0)
LYMPHS ABS: 1 10*3/uL (ref 0.7–4.0)
Lymphocytes Relative: 10 % — ABNORMAL LOW (ref 12–46)
MCH: 21.7 pg — ABNORMAL LOW (ref 26.0–34.0)
MCHC: 32 g/dL (ref 30.0–36.0)
MCV: 67.6 fL — AB (ref 78.0–100.0)
MONOS PCT: 10 % (ref 3–12)
Monocytes Absolute: 1 10*3/uL (ref 0.1–1.0)
Neutro Abs: 8 10*3/uL — ABNORMAL HIGH (ref 1.7–7.7)
Neutrophils Relative %: 78 % — ABNORMAL HIGH (ref 43–77)
PLATELETS: 209 10*3/uL (ref 150–400)
RBC: 4.2 MIL/uL (ref 3.87–5.11)
RDW: 14.7 % (ref 11.5–15.5)
WBC: 10.2 10*3/uL (ref 4.0–10.5)

## 2014-05-24 LAB — SEDIMENTATION RATE: Sed Rate: 83 mm/hr — ABNORMAL HIGH (ref 0–22)

## 2014-05-24 LAB — BASIC METABOLIC PANEL
Anion gap: 13 (ref 5–15)
BUN: 42 mg/dL — AB (ref 6–23)
CO2: 32 mEq/L (ref 19–32)
Calcium: 9.2 mg/dL (ref 8.4–10.5)
Chloride: 89 mEq/L — ABNORMAL LOW (ref 96–112)
Creatinine, Ser: 2.68 mg/dL — ABNORMAL HIGH (ref 0.50–1.10)
GFR calc Af Amer: 21 mL/min — ABNORMAL LOW (ref >90)
GFR, EST NON AFRICAN AMERICAN: 18 mL/min — AB (ref >90)
Glucose, Bld: 112 mg/dL — ABNORMAL HIGH (ref 70–99)
POTASSIUM: 3.4 meq/L — AB (ref 3.7–5.3)
SODIUM: 134 meq/L — AB (ref 137–147)

## 2014-05-24 LAB — GLUCOSE, CAPILLARY: Glucose-Capillary: 183 mg/dL — ABNORMAL HIGH (ref 70–99)

## 2014-05-24 LAB — PRO B NATRIURETIC PEPTIDE: PRO B NATRI PEPTIDE: 1235 pg/mL — AB (ref 0–125)

## 2014-05-24 LAB — COMPREHENSIVE METABOLIC PANEL
ALBUMIN: 3 g/dL — AB (ref 3.5–5.2)
ALK PHOS: 169 U/L — AB (ref 39–117)
ALT: 9 U/L (ref 0–35)
ANION GAP: 16 — AB (ref 5–15)
AST: 20 U/L (ref 0–37)
BILIRUBIN TOTAL: 0.8 mg/dL (ref 0.3–1.2)
BUN: 45 mg/dL — AB (ref 6–23)
CHLORIDE: 87 meq/L — AB (ref 96–112)
CO2: 31 mEq/L (ref 19–32)
Calcium: 9.5 mg/dL (ref 8.4–10.5)
Creatinine, Ser: 2.96 mg/dL — ABNORMAL HIGH (ref 0.50–1.10)
GFR calc Af Amer: 19 mL/min — ABNORMAL LOW (ref >90)
GFR calc non Af Amer: 16 mL/min — ABNORMAL LOW (ref >90)
Glucose, Bld: 123 mg/dL — ABNORMAL HIGH (ref 70–99)
POTASSIUM: 3.3 meq/L — AB (ref 3.7–5.3)
SODIUM: 134 meq/L — AB (ref 137–147)
Total Protein: 7.5 g/dL (ref 6.0–8.3)

## 2014-05-24 LAB — CBC
HCT: 27.8 % — ABNORMAL LOW (ref 36.0–46.0)
Hemoglobin: 8.9 g/dL — ABNORMAL LOW (ref 12.0–15.0)
MCH: 22 pg — AB (ref 26.0–34.0)
MCHC: 32 g/dL (ref 30.0–36.0)
MCV: 68.6 fL — ABNORMAL LOW (ref 78.0–100.0)
PLATELETS: 191 10*3/uL (ref 150–400)
RBC: 4.05 MIL/uL (ref 3.87–5.11)
RDW: 14.8 % (ref 11.5–15.5)
WBC: 10.1 10*3/uL (ref 4.0–10.5)

## 2014-05-24 LAB — URIC ACID: Uric Acid, Serum: 17.5 mg/dL — ABNORMAL HIGH (ref 2.4–7.0)

## 2014-05-24 LAB — PROTIME-INR
INR: 1.09 (ref 0.00–1.49)
PROTHROMBIN TIME: 14.2 s (ref 11.6–15.2)

## 2014-05-24 LAB — HIV ANTIBODY (ROUTINE TESTING W REFLEX): HIV: NONREACTIVE

## 2014-05-24 LAB — LACTIC ACID, PLASMA: Lactic Acid, Venous: 1.2 mmol/L (ref 0.5–2.2)

## 2014-05-24 LAB — CREATININE, URINE, RANDOM: Creatinine, Urine: 130.4 mg/dL

## 2014-05-24 MED ORDER — OXYCODONE HCL 5 MG PO TABS
5.0000 mg | ORAL_TABLET | ORAL | Status: DC | PRN
  Administered 2014-05-25 – 2014-05-28 (×13): 10 mg via ORAL
  Filled 2014-05-24 (×13): qty 2

## 2014-05-24 MED ORDER — ASPIRIN 81 MG PO CHEW
81.0000 mg | CHEWABLE_TABLET | Freq: Every day | ORAL | Status: DC
  Administered 2014-05-24 – 2014-05-28 (×5): 81 mg via ORAL
  Filled 2014-05-24 (×5): qty 1

## 2014-05-24 MED ORDER — DOCUSATE SODIUM 100 MG PO CAPS
100.0000 mg | ORAL_CAPSULE | Freq: Two times a day (BID) | ORAL | Status: DC
  Administered 2014-05-24 – 2014-05-28 (×9): 100 mg via ORAL
  Filled 2014-05-24 (×10): qty 1

## 2014-05-24 MED ORDER — SODIUM CHLORIDE 0.9 % IV SOLN: 250.0000 mL | INTRAVENOUS | Status: DC | PRN

## 2014-05-24 MED ORDER — FUROSEMIDE 40 MG PO TABS: 40.0000 mg | ORAL_TABLET | Freq: Two times a day (BID) | ORAL | Status: DC

## 2014-05-24 MED ORDER — MOMETASONE FURO-FORMOTEROL FUM 100-5 MCG/ACT IN AERO
2.0000 | INHALATION_SPRAY | Freq: Two times a day (BID) | RESPIRATORY_TRACT | Status: DC
  Administered 2014-05-24 – 2014-05-28 (×9): 2 via RESPIRATORY_TRACT
  Filled 2014-05-24: qty 8.8

## 2014-05-24 MED ORDER — POTASSIUM CHLORIDE CRYS ER 20 MEQ PO TBCR
40.0000 meq | EXTENDED_RELEASE_TABLET | Freq: Once | ORAL | Status: AC
  Administered 2014-05-24: 40 meq via ORAL
  Filled 2014-05-24: qty 2

## 2014-05-24 MED ORDER — METHYLPREDNISOLONE SODIUM SUCC 40 MG IJ SOLR
40.0000 mg | Freq: Once | INTRAMUSCULAR | Status: AC
  Administered 2014-05-24: 40 mg via INTRAVENOUS
  Filled 2014-05-24: qty 1

## 2014-05-24 MED ORDER — INSULIN ASPART 100 UNIT/ML ~~LOC~~ SOLN
0.0000 [IU] | Freq: Three times a day (TID) | SUBCUTANEOUS | Status: DC
  Administered 2014-05-24: 3 [IU] via SUBCUTANEOUS
  Administered 2014-05-25 (×2): 2 [IU] via SUBCUTANEOUS
  Administered 2014-05-25: 5 [IU] via SUBCUTANEOUS
  Administered 2014-05-26 – 2014-05-27 (×3): 3 [IU] via SUBCUTANEOUS

## 2014-05-24 MED ORDER — SODIUM CHLORIDE 0.9 % IJ SOLN: 3.0000 mL | INTRAMUSCULAR | Status: DC | PRN

## 2014-05-24 MED ORDER — SPIRONOLACTONE 25 MG PO TABS
25.0000 mg | ORAL_TABLET | Freq: Every day | ORAL | Status: DC
  Administered 2014-05-24 – 2014-05-28 (×5): 25 mg via ORAL
  Filled 2014-05-24 (×5): qty 1

## 2014-05-24 MED ORDER — MORPHINE SULFATE 4 MG/ML IJ SOLN
4.0000 mg | Freq: Once | INTRAMUSCULAR | Status: AC
  Administered 2014-05-24: 4 mg via INTRAVENOUS
  Filled 2014-05-24: qty 1

## 2014-05-24 MED ORDER — HYDROMORPHONE HCL 1 MG/ML IJ SOLN
1.0000 mg | Freq: Once | INTRAMUSCULAR | Status: AC
  Administered 2014-05-24: 1 mg via INTRAVENOUS
  Filled 2014-05-24: qty 1

## 2014-05-24 MED ORDER — INSULIN ASPART 100 UNIT/ML ~~LOC~~ SOLN: 0.0000 [IU] | Freq: Every day | SUBCUTANEOUS | Status: DC

## 2014-05-24 MED ORDER — SODIUM CHLORIDE 0.9 % IJ SOLN
3.0000 mL | Freq: Two times a day (BID) | INTRAMUSCULAR | Status: DC
  Administered 2014-05-27: 3 mL via INTRAVENOUS

## 2014-05-24 MED ORDER — PREDNISONE 50 MG PO TABS
50.0000 mg | ORAL_TABLET | Freq: Every day | ORAL | Status: DC
  Administered 2014-05-24 – 2014-05-28 (×5): 50 mg via ORAL
  Filled 2014-05-24 (×6): qty 1

## 2014-05-24 MED ORDER — HYDROMORPHONE HCL 1 MG/ML IJ SOLN: 1.0000 mg | INTRAMUSCULAR | Status: AC | PRN

## 2014-05-24 MED ORDER — SODIUM CHLORIDE 0.9 % IJ SOLN
3.0000 mL | Freq: Two times a day (BID) | INTRAMUSCULAR | Status: DC
  Administered 2014-05-24 – 2014-05-28 (×8): 3 mL via INTRAVENOUS

## 2014-05-24 MED ORDER — HEPARIN SODIUM (PORCINE) 5000 UNIT/ML IJ SOLN
5000.0000 [IU] | Freq: Three times a day (TID) | INTRAMUSCULAR | Status: DC
  Administered 2014-05-24 – 2014-05-28 (×13): 5000 [IU] via SUBCUTANEOUS
  Filled 2014-05-24 (×16): qty 1

## 2014-05-24 MED ORDER — CALCIUM CARBONATE-VITAMIN D 500-200 MG-UNIT PO TABS
1.0000 | ORAL_TABLET | Freq: Every day | ORAL | Status: AC
  Administered 2014-05-24: 1 via ORAL
  Filled 2014-05-24: qty 1

## 2014-05-24 MED ORDER — PREDNISONE 10 MG PO TABS
10.0000 mg | ORAL_TABLET | Freq: Every day | ORAL | Status: DC
  Filled 2014-05-24 (×2): qty 1

## 2014-05-24 NOTE — ED Notes (Signed)
Patient transported to X-ray 

## 2014-05-24 NOTE — Consult Note (Signed)
WOC wound consult note Reason for Consult: Intertriginous dermatitis under skin folds of breasts, abdomen and mid back.  Wound type: Moisture Associated Skin damage Measurement: Raw, denuded skin under bilateral breasts, abdominal pannus, extending to left hip and mid back skin fold.  Wound bed: raw, pink and moist Drainage (amount, consistency, odor) Musty odor Periwound: Erythema under skin folds.  Dressing procedure/placement/frequency: Cleanse skin under breasts and abdominal pannus with soap and water.  Apply interdry Ag, silver therapeutic linen in skin folds and leave 1 " wicking extending out from fold.  Change every 5 days.  Remove for bathing and replace daily.  Cleanse skin fold to mid back with NS and pat gently dry.  Apply Allevyn, silicone bordered foam dressing. Change every 3 days and PRN.   Will not follow at this time.  Please re-consult if needed.  Domenic Moras RN BSN [place] Pager [phone]

## 2014-05-24 NOTE — Progress Notes (Signed)
[facility] PROGRESS NOTE  Pamela [name] VCB:[id number] DOB: 08-18-1950 DOA: 05/23/2014 PCP: MATTHEWS,MICHELLE A., MD   Brief narrative 63 year old morbidly obese female with history of ?COPD/ asthma,, sarcoidosis, right upper lobe lung cancer who recently relocated from [place] ( was following with a pulmonologist and oncologist there), OSA on CPAP,  Assessment/Plan: Acute polyarticular gouty arthritis  high uric acid of 17.5 and admission. Clinical exam consistent with acute gout mainly involving her hands knees and ankles. Avoid NSAIDs or colchicine given her worsening renal function. Given a dose of Solu-Medrol and started on oral prednisone 50 mg daily this morning.(patient is on prednisone at home for sarcoidosis, I am not aware about the dose) -Holding HCTZ avoid worsening of uric acid level. -Clinically feels better with improved pain over her knees and ankles. X-ray of the right hand shows degenerative changes. -pain control with when necessary Percocet and Dilaudid.  Acute on chronic kidney disease stage III Baseline creatinine of 1.3. Possibly related to NSAIDs use at home (patient reportedly using Mobic and ibuprofen regularly for her joint pains). Instructed clearly on avoiding NSAIDs. Patient also on HCTZ which has been discontinued.hold Lasix.  -Renal ultrasound unremarkable. Avoid nephrotoxic agents.Monitor renal function daily  History of CHF with chronic edema Appears euvolemic. Holding Lasix given acute kidney injury. Appears to have chronic lymphadema Continue aspirin -needs 2-D echo as outpatient  History of Asthma Continue Advair  OSA on CPAP Will order   History of sarcoidosis On chronic prednisone as per PCP note. Not noted on her home medication.  Hypertension Continue Aldactone. Hold HCTZ and Lasix  Hypokalemia Replenish  Morbid obesity Needs counseling on diet and exercise  Hx of right lung mass Status post radiation with CT chest  results done 6 months back showing unchanged result. Follow as outpt  Anemia Possibly of chronic disease  Prediabetes A1c of 6.4. Monitor on sliding scale insulin   DVT prophylaxis: Subcutaneous heparin  Diet: Cardiac  Code Status: full code Family Communication: daughter-in-law at bedside Disposition Plan: Home once improved   Consultants:  none  Procedures:  none  Antibiotics:  none  HPI/Subjective: Patient seen and examined this morning. Reports her knee and ankle pains to be better. Still have pain in her right hand  Objective: Filed Vitals:   05/24/14 1439  BP: 113/53  Pulse: 68  Temp: 98.3 F (36.8 C)  Resp: 18    Intake/Output Summary (Last 24 hours) at 05/24/14 1500 Last data filed at 05/24/14 1453  Gross per 24 hour  Intake    800 ml  Output    675 ml  Net    125 ml   Filed Weights   05/23/14 2225 05/24/14 0527  Weight: 116.574 kg (257 lb) 169.645 kg (374 lb)    Exam:   General:  Elderly morbidly obese female in no acute distress  HEENT: Moist oral mucosa  Chest: Clear to auscultation bilaterally  CVS: Normal S1 and S2, no murmurs  Abdomen: Soft, nondistended, nontender  Extremities: Deformed and swollen hand joints with some swelling and tenderness ( R>>L), no swelling, warmth or tenderness of the knee or ankle joints  CNS: alert and oriented.  Data Reviewed: Basic Metabolic Panel:  Recent Labs Lab 05/21/14 1716 05/24/14 0030 05/24/14 0700  NA CANCELED 134* 134*  K CANCELED 3.3* 3.4*  CL CANCELED 87* 89*  CO2 CANCELED 31 32  GLUCOSE CANCELED 123* 112*  BUN CANCELED 45* 42*  CREATININE CANCELED 2.96* 2.68*  CALCIUM CANCELED 9.5 9.2   Liver Function  Tests:  Recent Labs Lab 05/24/14 0030  AST 20  ALT 9  ALKPHOS 169*  BILITOT 0.8  PROT 7.5  ALBUMIN 3.0*   No results for input(s): LIPASE, AMYLASE in the last 168 hours. No results for input(s): AMMONIA in the last 168 hours. CBC:  Recent Labs Lab  05/21/14 1716 05/24/14 0030 05/24/14 0700  WBC 7.3 10.2 10.1  NEUTROABS 4.6 8.0*  --   HGB 10.2* 9.1* 8.9*  HCT 30.9* 28.4* 27.8*  MCV 65.7* 67.6* 68.6*  PLT 284 209 191   Cardiac Enzymes: No results for input(s): CKTOTAL, CKMB, CKMBINDEX, TROPONINI in the last 168 hours. BNP (last 3 results)  Recent Labs  04/13/14 1234 05/24/14 0700  PROBNP 1144.00* 1235.0*   CBG: No results for input(s): GLUCAP in the last 168 hours.  No results found for this or any previous visit (from the past 240 hour(s)).   Studies: US Renal  05/24/2014   CLINICAL DATA:  Acute kidney injury  EXAM: RENAL/URINARY TRACT ULTRASOUND COMPLETE  COMPARISON:  None.  FINDINGS: Right Kidney:  Length: 10.3 cm.  No mass or hydronephrosis.  Left Kidney:  Length: 10.9 cm.  No mass or hydronephrosis.  Bladder:  Mildly thick-walled but unremarkable.  IMPRESSION: Negative renal ultrasound.   Electronically Signed   By: Julian Hy M.D.   On: 05/24/2014 09:15   Dg Hand Complete Right  05/24/2014   CLINICAL DATA:  Increasing pain in the right hand over the past week. No known injury.  EXAM: RIGHT HAND - COMPLETE 3+ VIEW  COMPARISON:  None.  FINDINGS: Degenerative changes in the interphalangeal joints. Diffuse soft tissue swelling. Old ununited ossicle at the ulnar styloid process. Periarticular bone erosions are demonstrated at multiple proximal interphalangeal joints. Changes suggest erosive arthritis. No evidence of acute fracture or subluxation. Bone cortex and trabecular architecture appear intact. No radiopaque soft tissue foreign bodies.  IMPRESSION: Degenerative changes in the right hand. 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 Diffuse soft tissue swelling.   Electronically Signed   By: Lucienne Capers M.D.   On: 05/24/2014 03:44    Scheduled Meds: . aspirin  81 mg Oral Daily  . docusate sodium  100 mg Oral BID  . heparin  5,000 Units Subcutaneous 3 times per day  . mometasone-formoterol  2  puff Inhalation BID  . predniSONE  50 mg Oral Q breakfast  . sodium chloride  3 mL Intravenous Q12H  . sodium chloride  3 mL Intravenous Q12H  . spironolactone  25 mg Oral Daily   Continuous Infusions:      Time spent: 25 minutes    [name]  [facility] Pager [phone] If 7PM-7AM, please contact night-coverage at [email or web address], password [facility] 05/24/2014, 3:00 PM  LOS: 1 day

## 2014-05-24 NOTE — H&P (Signed)
[facility] History and Physical  Novis League AUQ:[id number] DOB: 12-Nov-1950 DOA: 05/23/2014  Referring physician: ED physician PCP: MATTHEWS,MICHELLE A., MD  Specialists:   Chief Complaint: multiple joint pain  HPI: Pamela [name] is a 63 y.o. 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  He reports that she started having joint pain over her left foot intimally 3 weeks ago, which gradually improved. Then she started having right hand and left hand pain over her right wrist, and left second PIP joint. She has subjective fever and chills. She did not have joint injury. Patient has chronic cough and shortness of breath because of COPD. She is using 2 L oxygen at home. She feels that her coughing and shortness of breath is at her baseline. She does not have nausea, vomiting, abdominal pain, diarrhea. She has increased urinary frequency recently, but no dysuria or burning on urination.   In Ed, she is found to have worsening kidney function with a creatinine increased from 1.88 on 04/13/14 to 2.96 today, uric acid 17.5, ESR 83. Patient is admitted to seeing patient for further evaluation and treatment.  Review of Systems: As presented in the history of presenting illness, rest negative.  Where does patient live? Lives with her son at home  Can patient participate in ADLs? barely  Allergy:  Allergies  Allergen Reactions  . Sulfur Rash    Past Medical History  Diagnosis Date  . CHF (congestive heart failure)   . Asthma   . COPD (chronic obstructive pulmonary disease)   . Arthritis   . Sarcoidosis   . Cancer     lung  . Hypertension   . Sleep apnea   . Oxygen deficiency      Past Surgical History  Procedure Laterality Date  . Lung biopsy    . Tubal ligation    . Vein ligation and stripping      Social History:  reports that she has never smoked. She has never used smokeless tobacco. She reports that she does not drink alcohol or use illicit drugs.  Family History:  Family History  Problem Relation Age of Onset  . Hypertension Mother   . Renal Disease Mother   . Cancer Father   . Heart disease Father   . Cancer Brother   . Diabetes Brother   . Cervical cancer Sister   . Diabetes Sister   . Multiple myeloma Sister      Prior to Admission medications   Medication Sig Start Date End Date Taking? Authorizing Provider  aspirin 81 MG tablet Take 81 mg by mouth daily.   Yes Historical Provider, MD  Calcium Carbonate-Vitamin D (CALTRATE 600+D PO) Take 1 tablet by mouth 1 day or 1 dose.   Yes Historical Provider, MD  docusate sodium (COLACE) 100 MG capsule Take 100 mg by mouth 2 (two) times daily.   Yes Historical Provider, MD  furosemide (LASIX) 40 MG tablet Take 40 mg by mouth 2 (two) times daily.   Yes Historical Provider, MD  meloxicam (MOBIC) 7.5 MG tablet Take 7.5 mg by mouth 2 (two) times daily.   Yes Historical Provider, MD  mometasone-formoterol (DULERA) 100-5 MCG/ACT AERO Take 2 puffs first thing in am and then another 2 puffs about 12 hours later.  04/27/14  Yes Tanda Rockers, MD  OVER THE COUNTER MEDICATION Take 1,000 mg by mouth daily.   Yes Historical Provider, MD  oxycodone (OXY-IR) 5 MG capsule Take 5-10 mg by mouth every 4 (four) hours as needed for pain.    Yes Historical Provider, MD  Specialty Vitamins Products (MAGNESIUM, AMINO ACID CHELATE,) 133 MG tablet Take 1 tablet by mouth 2 (two) times daily.   Yes Historical Provider, MD  ibuprofen (ADVIL,MOTRIN) 800 MG tablet Take 800 mg by mouth every 6 (six) hours as needed.    Historical Provider, MD  spironolactone-hydrochlorothiazide (ALDACTAZIDE) 25-25 MG per tablet Take 1 tablet by mouth  daily.    Historical Provider, MD    Physical Exam: Filed Vitals:   05/24/14 0130 05/24/14 0200 05/24/14 0230 05/24/14 0300  BP: 129/67 132/64 118/59 128/62  Pulse: 84 85 84 87  Temp:      TempSrc:      Resp:   20   Height:      Weight:      SpO2: 98% 95% 97% 98%   General: Not in acute distress. Dry mucous and membrane HEENT:       Eyes: PERRL, EOMI, no scleral icterus       ENT: No discharge from the ears and nose, no pharynx injection, no tonsillar enlargement.        Neck: no bruit, no mass felt.. Difficult to assess JVD due to Morbid obesity Cardiac: S1/S2, RRR, No murmurs, gallops or rubs Pulm: Good air movement bilaterally. Clear to auscultation bilaterally. No rales, wheezing, rhonchi or rubs. Abd: Soft, nondistended, nontender, no rebound pain, no organomegaly, BS present Ext: Chronic changes of venous stasis over bilateral LE's. Very tender over right wrist joint, left PIP, bilateral great toes. Moderately tender over bilateral knee joints and ankles. Because of morbid obesity, it is difficult to assess whether patient has swelling, but no obvious redness or warmth. 2+DP/PT pulse bilaterally Musculoskeletal: No joint deformities, erythema, or stiffness, ROM full Skin: No rashes.  Neuro: Alert and oriented X3, cranial nerves II-XII grossly intact, muscle strength 5/5 in all extremeties, sensation to light touch intact. Brachial reflex 2+ bilaterally. Knee reflex 1+ bilaterally. Negative Babinski's sign. Normal finger to nose test. Psych: Patient is not psychotic, no suicidal or hemocidal ideation.  Labs on Admission:  Basic Metabolic Panel:  Recent Labs Lab 05/21/14 1716 05/24/14 0030  NA CANCELED 134*  K CANCELED 3.3*  CL CANCELED 87*  CO2 CANCELED 31  GLUCOSE CANCELED 123*  BUN CANCELED 45*  CREATININE CANCELED 2.96*  CALCIUM CANCELED 9.5   Liver Function Tests:  Recent Labs Lab 05/24/14 0030  AST 20  ALT 9  ALKPHOS 169*  BILITOT 0.8  PROT 7.5   ALBUMIN 3.0*   No results for input(s): LIPASE, AMYLASE in the last 168 hours. No results for input(s): AMMONIA in the last 168 hours. CBC:  Recent Labs Lab 05/21/14 1716 05/24/14 0030  WBC 7.3 10.2  NEUTROABS 4.6 8.0*  HGB 10.2* 9.1*  HCT 30.9* 28.4*  MCV 65.7* 67.6*  PLT 284 209   Cardiac Enzymes: No results for input(s): CKTOTAL, CKMB, CKMBINDEX, TROPONINI in the last 168 hours.  BNP (last 3 results)  Recent Labs  04/13/14 1234  PROBNP 1144.00*   CBG: No results for input(s): GLUCAP in the last 168 hours.  Radiological Exams on Admission: Dg Hand Complete Right  05/24/2014   CLINICAL DATA:  Increasing pain in the right hand over the past week. No known injury.  EXAM: RIGHT HAND - COMPLETE 3+ VIEW  COMPARISON:  None.  FINDINGS: Degenerative changes in the interphalangeal joints. Diffuse soft tissue swelling. Old ununited ossicle at the ulnar styloid process. Periarticular bone erosions are demonstrated at multiple proximal interphalangeal joints. Changes suggest erosive arthritis. No evidence of acute fracture or subluxation. Bone cortex and trabecular architecture appear intact. No radiopaque soft tissue foreign bodies.  IMPRESSION: Degenerative changes in the right hand. 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 Diffuse soft tissue swelling.   Electronically Signed   By: Lucienne Capers M.D.   On: 05/24/2014 03:44    EKG: Independently reviewed.   Assessment/Plan Principal Problem:   Joint pain Active Problems:   Congestive heart failure   Essential hypertension   History of lung cancer   Morbid obesity   On home oxygen therapy   H/O sleep apnea   Anemia of chronic disease   Asthma, chronic   CHF (congestive heart failure)   COPD (chronic obstructive pulmonary disease)   Sarcoidosis   Renal failure  Joint pain: Pt reports a h/o multiple gouty flares in the past. Her uric acid is elevated at 17.5, though not diagnostic, but is consistent with  gout. Given her worsening renal function, will not use NSIADs or cholchicine. Because of morbid obesity, it is difficult to assess whether patient has swelling, but no obvious redness or warmth, making septic joint less likely the diagnosis.   - will admit to tele bed given her CHF - will give one dose of IV solumedrol 40 mg x 1 and followed by prednison 10 mg daily. will not use NSIADs or cholchicine given acute on CKD - check GC/Chalamydia given her migratory nature of joint pain. - switch aldactazide to spironolactone (d/c HCTZ which may increase uric acid level)  Acute on CKD-III:  last cre was 1.88 on 04/13/14. Her cre is 2.96 on admission. Patient was on mobic and ibuprofen which may have contributed to worsening renal fx. -will d/c Mobic and ibuprofen - hold lasix - check FeUrea - renal US  HTN - on lasix and spironolactone  Chronic congestive heart failure, unspecified congestive heart failure type:  Pt reports CHF but is unable to tell if Diastolic or systolic dysfunction. On admission patient is clinically dry, no signs of acute exacerbation.  -will hold lasix given worsening renal fx - continue ASA and Spironolactone - check pro BNP  Microcytic anemia: Hemoglobin 9.1 (10.2 on 05/21/14).  - will check anemia panel - FOBT - repeat CBC   DVT ppx: SQ Heparin   Code Status: Full code Family Communication: Yes, patient's   son    at bed side Disposition Plan: Admit to inpatient   Date of Service 05/24/2014    Ivor Costa [facility] Pager [phone]  If 7PM-7AM, please contact night-coverage [email or web address] Password [email or web address] 05/24/2014, 4:29 AM

## 2014-05-24 NOTE — Progress Notes (Signed)
Pt has declined CPAP for the night, Pt to call if she changes her mind.  RT to monitor and assess as needed.

## 2014-05-24 NOTE — Evaluation (Signed)
Occupational Therapy Evaluation Patient Details Name: Pamela [name] MRN: [id number] DOB: Apr 09, 1951 Today's Date: 05/24/2014    History of Present Illness  63 y.o. 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   Clinical Impression   Pt admitted with gout. Pt currently with functional limitations due to the deficits listed below (see OT Problem List).  Pt will benefit from skilled OT to increase their safety and independence with ADL and functional mobility for ADL to facilitate discharge to venue listed below.      Follow Up Recommendations  SNF    Equipment Recommendations  None recommended by OT       Precautions / Restrictions Precautions Precaution Comments: pain in hands/wrist      Mobility Bed Mobility      Pt on Bedrest         General bed mobility comments: NT  Transfers                 General transfer comment: NT         ADL Overall ADL's : Needs assistance/impaired Eating/Feeding: Moderate assistance Eating/Feeding Details (indicate cue type and reason): OT issued pt a blue mug in which pt can hold easier due to handle. Pts hands are very painful and pt is unable to feed self- but pt is able to use mug with S                                   General ADL Comments: communicated with NT pt needs a touch pad.                Pertinent Vitals/Pain Pain Assessment: 0-10 Pain Score: 7  Pain Descriptors / Indicators: Sore;Throbbing;Tender;Stabbing Pain Intervention(s): Limited activity within patient's tolerance;Monitored during session;Repositioned;Patient requesting pain meds-RN notified        Extremity/Trunk Assessment Upper  Extremity Assessment Upper Extremity Assessment:  (very very limited due to gout pain.)           Communication     Cognition Arousal/Alertness: Awake/alert Behavior During Therapy: WFL for tasks assessed/performed Overall Cognitive Status: Within Functional Limits for tasks assessed                     General Comments   Pt very limited due to gout/pain in hands and wrist.  Educated pt on ROM, positioning and using BUE as able.            Home Living Family/patient expects to be discharged to:: Private residence Living Arrangements: Children Available Help at Discharge: Family Type of Home: House                                       OT Diagnosis: Generalized weakness   OT Problem List: Decreased strength;Decreased range of motion;Decreased activity tolerance;Pain   OT Treatment/Interventions: Self-care/ADL training;DME and/or AE instruction;Patient/family education;Therapeutic exercise    OT Goals(Current goals can be found in the care plan section) Acute Rehab OT Goals Patient Stated Goal: take care of my self OT Goal Formulation: With patient Time For Goal Achievement: 06/07/14 Potential to Achieve Goals:  Good  OT Frequency: Min 3X/week   Barriers to D/C:               End of Session Nurse Communication: Mobility status  Activity Tolerance: Patient limited by pain Patient left: in bed   Time: 1100-1121 OT Time Calculation (min): 21 min Charges:  OT General Charges $OT Visit: 1 Procedure OT Evaluation $Initial OT Evaluation Tier I: 1 Procedure OT Treatments $Therapeutic Activity: 8-22 mins G-Codes:    Pamela [name], Thereasa Parkin Jun 04, 2014, 1:00 PM

## 2014-05-25 DIAGNOSIS — M109 Gout, unspecified: Secondary | ICD-10-CM | POA: Diagnosis present

## 2014-05-25 DIAGNOSIS — N183 Chronic kidney disease, stage 3 unspecified: Secondary | ICD-10-CM | POA: Diagnosis present

## 2014-05-25 DIAGNOSIS — N179 Acute kidney failure, unspecified: Secondary | ICD-10-CM | POA: Diagnosis present

## 2014-05-25 DIAGNOSIS — G4733 Obstructive sleep apnea (adult) (pediatric): Secondary | ICD-10-CM | POA: Diagnosis present

## 2014-05-25 LAB — BASIC METABOLIC PANEL
ANION GAP: 13 (ref 5–15)
BUN: 41 mg/dL — ABNORMAL HIGH (ref 6–23)
CO2: 32 mEq/L (ref 19–32)
CREATININE: 2.07 mg/dL — AB (ref 0.50–1.10)
Calcium: 9 mg/dL (ref 8.4–10.5)
Chloride: 91 mEq/L — ABNORMAL LOW (ref 96–112)
GFR calc Af Amer: 29 mL/min — ABNORMAL LOW (ref >90)
GFR calc non Af Amer: 25 mL/min — ABNORMAL LOW (ref >90)
Glucose, Bld: 140 mg/dL — ABNORMAL HIGH (ref 70–99)
Potassium: 4 mEq/L (ref 3.7–5.3)
Sodium: 136 mEq/L — ABNORMAL LOW (ref 137–147)

## 2014-05-25 LAB — FERRITIN: FERRITIN: 295 ng/mL — AB (ref 10–291)

## 2014-05-25 LAB — IRON AND TIBC
IRON: 14 ug/dL — AB (ref 42–135)
SATURATION RATIOS: 7 % — AB (ref 20–55)
TIBC: 206 ug/dL — ABNORMAL LOW (ref 250–470)
UIBC: 192 ug/dL (ref 125–400)

## 2014-05-25 LAB — GLUCOSE, CAPILLARY
GLUCOSE-CAPILLARY: 198 mg/dL — AB (ref 70–99)
GLUCOSE-CAPILLARY: 138 mg/dL — AB (ref 70–99)
Glucose-Capillary: 129 mg/dL — ABNORMAL HIGH (ref 70–99)
Glucose-Capillary: 222 mg/dL — ABNORMAL HIGH (ref 70–99)
Glucose-Capillary: 181 mg/dL — ABNORMAL HIGH (ref 70–99)

## 2014-05-25 LAB — RETICULOCYTES
RBC.: 4.01 MIL/uL (ref 3.87–5.11)
Retic Count, Absolute: 72.2 10*3/uL (ref 19.0–186.0)
Retic Ct Pct: 1.8 % (ref 0.4–3.1)

## 2014-05-25 LAB — VITAMIN B12: Vitamin B-12: 1187 pg/mL — ABNORMAL HIGH (ref 211–911)

## 2014-05-25 LAB — UREA NITROGEN, URINE: Urea Nitrogen, Ur: 691 mg/dL

## 2014-05-25 LAB — FOLATE: Folate: 13.8 ng/mL

## 2014-05-25 NOTE — Progress Notes (Signed)
Clinical Social Work Department BRIEF PSYCHOSOCIAL ASSESSMENT 05/25/2014  Patient:  Pamela [name],Pamela [name]     Account Number:  [id number]     Admit date:  05/23/2014  Clinical Social Worker:  Earlie Server  Date/Time:  05/25/2014 04:15 PM  Referred by:  Physician  Date Referred:  05/25/2014 Referred for  SNF Placement   Other Referral:   Interview type:  Patient Other interview type:    PSYCHOSOCIAL DATA Living Status:  FAMILY Admitted from facility:   Level of care:   Primary support name:  [place] Primary support relationship to patient:  CHILD, ADULT Degree of support available:   Strong    CURRENT CONCERNS Current Concerns  Post-Acute Placement   Other Concerns:    SOCIAL WORK ASSESSMENT / PLAN CSW received referral to assist with DC planning. CSW reviewed chart and met with patient at bedside. CSW introduced myself and explained role.    Patient reports she moved from [place] to [place] at the end of August and is currently staying with her son. Patient reports that she has been doing well at home and has worked with PT and OT. Patient reports that she would prefer to go home and thinks family can assist her. CSW spoke about SNF in case family cannot provide assistance. Patient reports she has had [facility] in the past and would prefer to avoid a SNF placement. CSW offered to call son to explain options but patient reports he will come to visit her today and she wants to discuss plans with him face to face to ensure she is not being a burden to him. CSW suggested that CSW complete a [facility] search in case patient does decide SNF placement is needed. Patient agreeable to SNF search and asked for follow up over the weekend.    CSW completed FL2 and will ask weekend CSW to follow up with bed offers.   Assessment/plan status:  Psychosocial Support/Ongoing Assessment of Needs Other assessment/ plan:   Information/referral to community resources:   SNF list     PATIENT'S/FAMILY'S RESPONSE TO PLAN OF CARE: Patient alert and oriented. Patient reports she has plenty of equipment at home and is not concerned with her safety if she returns home. Patient reports it is important that her son be agreeable to her plan so she wants to discuss options with him as well. Patient agreeable for CSW follow up in order to discuss SNF options if needed.     [place], [place] [phone]

## 2014-05-25 NOTE — Progress Notes (Signed)
[facility] PROGRESS NOTE  Pamela [name] GLO:[id number] DOB: 1951/01/11 DOA: 05/23/2014 PCP: MATTHEWS,MICHELLE A., MD   Brief narrative 63 year old morbidly obese female with history of ?COPD/ asthma,, sarcoidosis, right upper lobe lung cancer who recently relocated from [place] ( was following with a pulmonologist and oncologist there), OSA on CPAP,who presented with acute multiple joint pain worsened over past several days. Patient also found to have acute on chronic kidney disease.  Assessment/Plan: Acute polyarticular gouty arthritis  high uric acid of 17.5 and admission. Clinical exam consistent with acute gout mainly involving her hands knees and ankles.  Continue prednisone with improvement in symptoms. Pain and swelling now confined to right wrist and metacarpal joints. Avoid NSAIDs or colchicine given her worsening renal function.  -Holding HCTZ avoid worsening of uric acid level. - X-ray of the right hand shows degenerative changes. -pain control with when necessary Percocet and Dilaudid.  Acute on chronic kidney disease stage III Baseline creatinine of 1.3. Possibly related to NSAIDs use at home (patient reportedly using Mobic and ibuprofen regularly for her joint pains). Instructed clearly on avoiding NSAIDs. Patient also on HCTZ which has been discontinued.hold Lasix.  -Renal ultrasound unremarkable. Avoid nephrotoxic agents. -renal function slowly improving.  History of CHF with chronic edema Appears euvolemic. Holding Lasix given acute kidney injury. 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 Not noted on her home medication.  Hypertension Continue Aldactone. Hold HCTZ and Lasix for now  Hypokalemia Replenish  Morbid obesity Needs counseling on diet and exercise  Hx  of right lung mass Status post radiation with CT chest results done 6 months back showing unchanged result. Follow as outpt  Anemia Possibly of chronic disease  Prediabetes A1c of 6.4. Monitor on sliding scale insulin   DVT prophylaxis: Subcutaneous heparin  Diet: Cardiac  Code Status: full code Family Communication: none at bedside Disposition Plan: home versus skilled nursing facility likely in the next 24 hours. Pending PT eval.   Consultants:  none  Procedures:  none  Antibiotics:  none  HPI/Subjective: Patient seen and examined this morning. complains of pain in her right hand.minimal pain over her knees and ankles. Remains afebrile Objective: Filed Vitals:   05/25/14 0621  BP: 111/75  Pulse: 61  Temp: 98 F (36.7 C)  Resp: 18    Intake/Output Summary (Last 24 hours) at 05/25/14 1307 Last data filed at 05/25/14 0948  Gross per 24 hour  Intake    360 ml  Output    575 ml  Net   -215 ml   Filed Weights   05/23/14 2225 05/24/14 0527  Weight: 116.574 kg (257 lb) 169.645 kg (374 lb)    Exam:   General:  Elderly morbidly obese female in some distress with pain.  HEENT: Moist oral mucosa  Chest: Clear to auscultation bilaterally  CVS: Normal S1 and S2, no murmurs  Abdomen: Soft, nondistended, nontender  Extremities: Deformed and swollen hand joints with tenderness over right wrist and metacarpals minimal tenderness over knees and ankles. No warmth or swelling  CNS: alert and oriented.  Data Reviewed: Basic Metabolic Panel:  Recent Labs Lab 05/21/14 1716 05/24/14 0030 05/24/14 0700 05/25/14 0818  NA CANCELED 134* 134* 136*  K CANCELED 3.3* 3.4* 4.0  CL CANCELED 87* 89* 91*  CO2 CANCELED 31 32 32  GLUCOSE CANCELED 123*  112* 140*  BUN CANCELED 45* 42* 41*  CREATININE CANCELED 2.96* 2.68* 2.07*  CALCIUM CANCELED 9.5 9.2 9.0   Liver Function Tests:  Recent Labs Lab 05/24/14 0030  AST 20  ALT 9  ALKPHOS 169*  BILITOT 0.8  PROT  7.5  ALBUMIN 3.0*   No results for input(s): LIPASE, AMYLASE in the last 168 hours. No results for input(s): AMMONIA in the last 168 hours. CBC:  Recent Labs Lab 05/21/14 1716 05/24/14 0030 05/24/14 0700  WBC 7.3 10.2 10.1  NEUTROABS 4.6 8.0*  --   HGB 10.2* 9.1* 8.9*  HCT 30.9* 28.4* 27.8*  MCV 65.7* 67.6* 68.6*  PLT 284 209 191   Cardiac Enzymes: No results for input(s): CKTOTAL, CKMB, CKMBINDEX, TROPONINI in the last 168 hours. BNP (last 3 results)  Recent Labs  04/13/14 1234 05/24/14 0700  PROBNP 1144.00* 1235.0*   CBG:  Recent Labs Lab 05/24/14 1707 05/24/14 2120 05/25/14 0734 05/25/14 1158  GLUCAP 183* 198* 129* 222*    Recent Results (from the past 240 hour(s))  Culture, blood (routine x 2)     Status: None (Preliminary result)   Collection Time: 05/24/14  7:00 AM  Result Value Ref Range Status   Specimen Description BLOOD RIGHT ARM  Final   Special Requests BOTTLES DRAWN AEROBIC AND ANAEROBIC 4CC  Final   Culture  Setup Time   Final    05/24/2014 10:59 Performed at [facility]    Culture   Final           BLOOD CULTURE RECEIVED NO GROWTH TO DATE CULTURE WILL BE HELD FOR 5 DAYS BEFORE ISSUING A FINAL NEGATIVE REPORT Performed at [facility]    Report Status PENDING  Incomplete  Culture, blood (routine x 2)     Status: None (Preliminary result)   Collection Time: 05/24/14  7:10 AM  Result Value Ref Range Status   Specimen Description BLOOD RIGHT ARM  Final   Special Requests BOTTLES DRAWN AEROBIC AND ANAEROBIC 6CC  Final   Culture  Setup Time   Final    05/24/2014 10:59 Performed at [facility]    Culture   Final           BLOOD CULTURE RECEIVED NO GROWTH TO DATE CULTURE WILL BE HELD FOR 5 DAYS BEFORE ISSUING A FINAL NEGATIVE REPORT Performed at [facility]    Report Status PENDING  Incomplete     Studies: US Renal  05/24/2014   CLINICAL DATA:  Acute kidney injury  EXAM: RENAL/URINARY TRACT ULTRASOUND  COMPLETE  COMPARISON:  None.  FINDINGS: Right Kidney:  Length: 10.3 cm.  No mass or hydronephrosis.  Left Kidney:  Length: 10.9 cm.  No mass or hydronephrosis.  Bladder:  Mildly thick-walled but unremarkable.  IMPRESSION: Negative renal ultrasound.   Electronically Signed   By: Julian Hy M.D.   On: 05/24/2014 09:15   Dg Hand Complete Right  05/24/2014   CLINICAL DATA:  Increasing pain in the right hand over the past week. No known injury.  EXAM: RIGHT HAND - COMPLETE 3+ VIEW  COMPARISON:  None.  FINDINGS: Degenerative changes in the interphalangeal joints. Diffuse soft tissue swelling. Old ununited ossicle at the ulnar styloid process. Periarticular bone erosions are demonstrated at multiple proximal interphalangeal joints. Changes suggest erosive arthritis. No evidence of acute fracture or subluxation. Bone cortex and trabecular architecture appear intact. No radiopaque soft tissue foreign bodies.  IMPRESSION: Degenerative changes in the right hand. Erosive arthritic changes  at the interphalangeal joints. Diffuse soft tissue swelling.   Electronically Signed   By: Lucienne Capers M.D.   On: 05/24/2014 03:44    Scheduled Meds: . aspirin  81 mg Oral Daily  . docusate sodium  100 mg Oral BID  . heparin  5,000 Units Subcutaneous 3 times per day  . insulin aspart  0-15 Units Subcutaneous TID WC  . insulin aspart  0-5 Units Subcutaneous QHS  . mometasone-formoterol  2 puff Inhalation BID  . predniSONE  50 mg Oral Q breakfast  . sodium chloride  3 mL Intravenous Q12H  . sodium chloride  3 mL Intravenous Q12H  . spironolactone  25 mg Oral Daily   Continuous Infusions:      Time spent: 25 minutes    Pamela [name]  [facility] Pager [phone] If 7PM-7AM, please contact night-coverage at [email or web address], password [facility] 05/25/2014, 1:07 PM  LOS: 2 days

## 2014-05-25 NOTE — Progress Notes (Addendum)
Clinical Social Work Department CLINICAL SOCIAL WORK PLACEMENT NOTE 05/25/2014  Patient:  Pamela [name],Pamela [name]  Account Number:  [id number] [place] date:  05/23/2014  Clinical Social Worker:  Sindy Messing, LCSW  Date/time:  05/25/2014 04:15 PM  Clinical Social Work is seeking post-discharge placement for this patient at the following level of care:   [place]   (*CSW will update this form in Epic as items are completed)   05/25/2014  Patient/family provided with [place] Department of Clinical Social Work's list of facilities offering this level of care within the geographic area requested by the patient (or if unable, by the patient's family).  05/25/2014  Patient/family informed of their freedom to choose among providers that offer the needed level of care, that participate in Medicare, Medicaid or managed care program needed by the patient, have an available bed and are willing to accept the patient.  05/25/2014  Patient/family informed of [facility]' ownership interest in [facility], as well as of the fact that they are under no obligation to receive care at this facility.  PASARR submitted to EDS on 05/25/2014 PASARR number received on 05/25/2014  FL2 transmitted to all facilities in geographic area requested by pt/family on  05/25/2014 FL2 transmitted to all facilities within larger geographic area on   Patient informed that his/her managed care company has contracts with or will negotiate with  certain facilities, including the following:     Patient/family informed of bed offers received:  05/26/14 Patient chooses bed at [facility] Physician recommends and patient chooses bed at    Patient to be transferred to [facility]  on 05/28/14  Patient to be transferred to facility by [place] Patient and family notified of transfer on 05/28/14 Name of family member notified:  Dtr-in-law Claudie Fisherman) via phone  The following physician request were  entered in Epic:   Additional Comments:

## 2014-05-25 NOTE — Progress Notes (Signed)
Occupational Therapy Treatment Patient Details Name: Pamela [name] MRN: [id number] DOB: [birth year] Today's Date: 05/25/2014    History of present illness  63 y.o. 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   OT comments  Pt reports LUE is improving in movement.  Educated on strategies for edema management.  Provided built up red foam for utensils  Follow Up Recommendations  SNF    Equipment Recommendations  None recommended by OT    Recommendations for Other Services      Precautions / Restrictions Precautions Precautions: Fall       Mobility Bed Mobility                  Transfers                      Balance                                   ADL                                         General ADL Comments: pt reports that L hand is moving better. She was working on AROM exercises when I arrived.  Performed gentle retrograde massage to L; unable to do this for R due to IV placement.  Repositioned bil UEs on pillows and towels to create ramp and posted a sign in room.  Provided red foam to try to self feed.      Vision                     Perception     Praxis      Cognition   Behavior During Therapy: WFL for tasks assessed/performed Overall Cognitive Status: Within Functional Limits for tasks assessed                       Extremity/Trunk Assessment               Exercises     Shoulder Instructions       General Comments      Pertinent Vitals/ Pain       Pain Assessment: Faces Pain Score: 6  Pain Location: bil hands Pain Intervention(s): Limited activity within patient's  tolerance;Monitored during session  Home Living                                          Prior Functioning/Environment              Frequency       Progress Toward Goals  OT Goals(current goals can now be found in the care plan section)  Progress towards OT goals: Progressing toward goals     Plan      Co-evaluation                 End of Session     Activity Tolerance     Patient Left  in  bed with call bell within reach   Nurse Communication          Time: 1222-1238 OT Time Calculation (min): 16 min  Charges: OT General Charges $OT Visit: 1 Procedure OT Treatments $Therapeutic Activity: 8-22 mins  Wilian Kwong 05/25/2014, 1:55 PM  Lesle Chris, OTR/L [phone] 05/25/2014

## 2014-05-25 NOTE — Progress Notes (Signed)
Pt refuses CPAP, RT to monitor and assess as needed.

## 2014-05-25 NOTE — Evaluation (Signed)
Physical Therapy Evaluation Patient Details Name: Pamela [name] MRN: [id number] DOB: Dec 13, 1950 Today's Date: 05/25/2014   History of Present Illness  63 y.o. 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  Clinical Impression  Patient presents with decreased independence with mobility due to deficits listed below in PT problem list.  He will benefit from skilled PT in the acute setting to allow return home with HHPT and family assist.    Follow Up Recommendations Home health PT;Supervision/Assistance - 24 hour    Equipment Recommendations   (TBA; maybe needs platform walker)    Recommendations for Other Services       Precautions / Restrictions Precautions Precautions: Fall Precaution Comments: pain in hands/wrist      Mobility  Bed Mobility Overal bed mobility: Needs Assistance Bed Mobility: Supine to Sit;Sit to Supine     Supine to sit: Mod assist;+2 for safety/equipment Sit to supine: +2 for safety/equipment;Mod assist   General bed mobility comments: moving lower body well, but could not use hands to help due to pain  Transfers Overall transfer level: Needs assistance   Transfers: Sit to/from Stand Sit to Stand: Mod assist;+2 physical assistance         General transfer comment: assist under both arms  Ambulation/Gait Ambulation/Gait assistance: Min assist;Mod assist Ambulation Distance (Feet): 60 Feet Assistive device: 2 person hand held assist Gait Pattern/deviations: Shuffle;Decreased stride length;Step-through pattern;Wide base of support;Antalgic     General Gait Details: left LE antalgic with pain in ankle  Stairs            Wheelchair Mobility     Modified Rankin (Stroke Patients Only)       Balance Overall balance assessment: Needs assistance         Standing balance support: Bilateral upper extremity supported Standing balance-Leahy Scale: Poor Standing balance comment: due to LE weakness/pain                             Pertinent Vitals/Pain Pain Assessment: Faces Pain Score: 6  Pain Location: hands Pain Intervention(s): Limited activity within patient's tolerance;Monitored during session    Home Living Family/patient expects to be discharged to:: Private residence Living Arrangements: Children Available Help at Discharge: Family Type of Home: House Home Access: Stairs to enter Entrance Stairs-Rails: Chemical engineer of Steps: 4 Home Layout: Two level;Bed/bath upstairs Home Equipment: Cane - single point      Prior Function Level of Independence: Needs assistance   Gait / Transfers Assistance Needed: walks with cane normally independent, has needed help with going up stairs since about 9th of Oct when gout in hands flared up, but could come down unaided           Hand Dominance        Extremity/Trunk Assessment               Lower Extremity Assessment: Generalized weakness         Communication   Communication: No difficulties  Cognition Arousal/Alertness: Awake/alert Behavior During Therapy: WFL for tasks assessed/performed Overall Cognitive Status: Within Functional Limits for tasks assessed                      General  Comments      Exercises        Assessment/Plan    PT Assessment    PT Diagnosis Acute pain;Generalized weakness;Abnormality of gait   PT Problem List    PT Treatment Interventions     PT Goals (Current goals can be found in the Care Plan section) Acute Rehab PT Goals Patient Stated Goal: to return to independent PT Goal Formulation: With patient Time For Goal Achievement: 06/08/14 Potential to Achieve Goals:  Good    Frequency     Barriers to discharge        Co-evaluation               End of Session Equipment Utilized During Treatment: Gait belt Activity Tolerance: Patient limited by pain;Patient limited by fatigue Patient left: in bed;with call bell/phone within reach Nurse Communication: Mobility status         Time: 1515-1540 PT Time Calculation (min): 25 min   Charges:   PT Evaluation $Initial PT Evaluation Tier I: 1 Procedure PT Treatments $Gait Training: 8-22 mins   PT G Codes:          WYNN,CYNDI 05/30/2014, 4:52 PM Magda Kiel, [place] 2014-05-30

## 2014-05-26 LAB — GLUCOSE, CAPILLARY
GLUCOSE-CAPILLARY: 117 mg/dL — AB (ref 70–99)
GLUCOSE-CAPILLARY: 173 mg/dL — AB (ref 70–99)
Glucose-Capillary: 155 mg/dL — ABNORMAL HIGH (ref 70–99)
Glucose-Capillary: 158 mg/dL — ABNORMAL HIGH (ref 70–99)

## 2014-05-26 LAB — BASIC METABOLIC PANEL
Anion gap: 11 (ref 5–15)
BUN: 48 mg/dL — ABNORMAL HIGH (ref 6–23)
CO2: 31 meq/L (ref 19–32)
CREATININE: 2.1 mg/dL — AB (ref 0.50–1.10)
Calcium: 8.5 mg/dL (ref 8.4–10.5)
Chloride: 92 mEq/L — ABNORMAL LOW (ref 96–112)
GFR calc Af Amer: 28 mL/min — ABNORMAL LOW (ref >90)
GFR calc non Af Amer: 24 mL/min — ABNORMAL LOW (ref >90)
GLUCOSE: 133 mg/dL — AB (ref 70–99)
Potassium: 4.3 mEq/L (ref 3.7–5.3)
Sodium: 134 mEq/L — ABNORMAL LOW (ref 137–147)

## 2014-05-26 LAB — GC/CHLAMYDIA PROBE AMP
CT Probe RNA: NEGATIVE
GC Probe RNA: NEGATIVE

## 2014-05-26 NOTE — Progress Notes (Signed)
[facility] PROGRESS NOTE  Alvetta Hidrogo BPZ:[id number] DOB: 1951/04/07 DOA: 05/23/2014 PCP: MATTHEWS,MICHELLE A., MD  Brief narrative 63 year old morbidly obese female with history of ?COPD/ asthma,, sarcoidosis, right upper lobe lung cancer who recently relocated from [place] ( was following with a pulmonologist and oncologist there), OSA on CPAP,who presented with acute multiple joint pain worsened over past several days. Patient also found to have acute on chronic kidney disease.  Assessment/Plan: Acute polyarticular gouty arthritis high uric acid of 17.5 and admission. Clinical exam consistent with acute gout mainly involving her hands knees and ankles.  Continue prednisone with improvement in symptoms. Pain and swelling now confined to right wrist and metacarpal joints. Avoid NSAIDs or colchicine given her worsening renal function.  -Holding HCTZ avoid worsening of uric acid level. - X-ray of the right hand shows degenerative changes. -pain control with when necessary Percocet and Dilaudid.  Acute on chronic kidney disease stage III Baseline creatinine of 1.3. Possibly related to NSAIDs use at home (patient reportedly using Mobic and ibuprofen regularly for her joint pains). Instructed clearly on avoiding NSAIDs. Patient also on HCTZ which has been discontinued. hold Lasix.  -Renal ultrasound unremarkable. Avoid nephrotoxic agents. -renal function slowly improving.  History of CHF with chronic edema Appears euvolemic. Holding Lasix given acute kidney injury. 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 proBNP of 1200. Will check 2-D echo  To evaluate LV function  History of Asthma Continue Advair  OSA on CPAP Patient refused CPAP overnight   History of sarcoidosis On chronic low-dose prednisone which was stopped by pulmonologist ( as per PCP). Need to verify with her pulmonologist about  this.  Hypertension Continue Aldactone. Hold HCTZ and Lasix for now  Hypokalemia Replenish  Morbid obesity Needs counseling on diet and exercise  Hx of right lung mass Status post radiation with CT chest results done 6 months back showing unchanged result. Follow as outpt  Anemia Possibly of chronic disease  Prediabetes A1c of 6.4. Monitor on sliding scale insulin  DVT prophylaxis: Subcutaneous heparin  Diet: Cardiac  Code Status: full code   Family Communication: son at bedside   Disposition Plan: nursing facility possibly on 11/9   Consultants:  none  Procedures:  none  Antibiotics:  none  HPI/Subjective: Reports feeling well but still has pain in rt hand  Objective: Filed Vitals:   05/26/14 0532  BP: 107/56  Pulse:   Temp:   Resp:     Intake/Output Summary (Last 24 hours) at 05/26/14 1425 Last data filed at 05/26/14 0241  Gross per 24 hour  Intake      0 ml  Output    575 ml  Net   -575 ml   Filed Weights   05/23/14 2225 05/24/14 0527 05/26/14 0532  Weight: 116.574 kg (257 lb) 169.645 kg (374 lb) 159.666 kg (352 lb)    Exam:   General: Elderly morbidly obese female in no acute distress  HEENT: Moist oral mucosa  Chest: Clear to auscultation bilaterally  CVS: Normal S1 and S2, no murmurs  Abdomen: Soft, nondistended, nontender  Extremities: Deformed and swollen hand joints with tenderness over right wrist and metacarpals , no tenderness over knees and ankles  CNS: alert and oriented.  Data Reviewed: Basic Metabolic Panel:  Recent Labs Lab 05/21/14 1716 05/24/14 0030 05/24/14 0700 05/25/14 0818 05/26/14 0524  NA CANCELED 134* 134* 136* 134*  K CANCELED 3.3* 3.4* 4.0 4.3  CL CANCELED 87* 89*  91* 92*  CO2 CANCELED 31 32 32 31  GLUCOSE CANCELED 123* 112* 140* 133*  BUN CANCELED 45* 42* 41* 48*  CREATININE CANCELED 2.96* 2.68* 2.07* 2.10*  CALCIUM CANCELED 9.5 9.2 9.0 8.5   Liver Function Tests:  Recent  Labs Lab 05/24/14 0030  AST 20  ALT 9  ALKPHOS 169*  BILITOT 0.8  PROT 7.5  ALBUMIN 3.0*   No results for input(s): LIPASE, AMYLASE in the last 168 hours. No results for input(s): AMMONIA in the last 168 hours. CBC:  Recent Labs Lab 05/21/14 1716 05/24/14 0030 05/24/14 0700  WBC 7.3 10.2 10.1  NEUTROABS 4.6 8.0*  --   HGB 10.2* 9.1* 8.9*  HCT 30.9* 28.4* 27.8*  MCV 65.7* 67.6* 68.6*  PLT 284 209 191   Cardiac Enzymes: No results for input(s): CKTOTAL, CKMB, CKMBINDEX, TROPONINI in the last 168 hours. BNP (last 3 results)  Recent Labs  04/13/14 1234 05/24/14 0700  PROBNP 1144.00* 1235.0*   CBG:  Recent Labs Lab 05/25/14 0734 05/25/14 1158 05/25/14 1611 05/25/14 2122 05/26/14 0822  GLUCAP 129* 222* 138* 181* 155*    Recent Results (from the past 240 hour(s))  Culture, blood (routine x 2)     Status: None (Preliminary result)   Collection Time: 05/24/14  7:00 AM  Result Value Ref Range Status   Specimen Description BLOOD RIGHT ARM  Final   Special Requests BOTTLES DRAWN AEROBIC AND ANAEROBIC 4CC  Final   Culture  Setup Time   Final    05/24/2014 10:59 Performed at [facility]    Culture   Final           BLOOD CULTURE RECEIVED NO GROWTH TO DATE CULTURE WILL BE HELD FOR 5 DAYS BEFORE ISSUING A FINAL NEGATIVE REPORT Performed at [facility]    Report Status PENDING  Incomplete  Culture, blood (routine x 2)     Status: None (Preliminary result)   Collection Time: 05/24/14  7:10 AM  Result Value Ref Range Status   Specimen Description BLOOD RIGHT ARM  Final   Special Requests BOTTLES DRAWN AEROBIC AND ANAEROBIC 6CC  Final   Culture  Setup Time   Final    05/24/2014 10:59 Performed at [facility]    Culture   Final           BLOOD CULTURE RECEIVED NO GROWTH TO DATE CULTURE WILL BE HELD FOR 5 DAYS BEFORE ISSUING A FINAL NEGATIVE REPORT Performed at [facility]    Report Status PENDING  Incomplete  GC/Chlamydia  Probe Amp     Status: None   Collection Time: 05/24/14 10:02 AM  Result Value Ref Range Status   CT Probe RNA NEGATIVE NEGATIVE Final   GC Probe RNA NEGATIVE NEGATIVE Final    Comment: (NOTE)                                                                                       **Normal Reference Range: Negative**      Assay performed using the Gen-Probe APTIMA COMBO2 (R) Assay. Acceptable specimen types for this assay include APTIMA Swabs (Unisex, endocervical, urethral, or vaginal), first  void urine, and ThinPrep liquid based cytology samples. Performed at [facility]      Studies: No results found.  Scheduled Meds: . aspirin  81 mg Oral Daily  . docusate sodium  100 mg Oral BID  . heparin  5,000 Units Subcutaneous 3 times per day  . insulin aspart  0-15 Units Subcutaneous TID WC  . insulin aspart  0-5 Units Subcutaneous QHS  . mometasone-formoterol  2 puff Inhalation BID  . predniSONE  50 mg Oral Q breakfast  . sodium chloride  3 mL Intravenous Q12H  . sodium chloride  3 mL Intravenous Q12H  . spironolactone  25 mg Oral Daily   Continuous Infusions:    Time spent: 25 minutes    Ivie Maese  [facility] Pager [phone] If 7PM-7AM, please contact night-coverage at [email or web address], password [facility] 05/26/2014, 2:25 PM  LOS: 3 days

## 2014-05-26 NOTE — Progress Notes (Signed)
CSW continuing to follow for disposition planning.  CSW met with pt and pt son at bedside. CSW discussed with pt disposition planning. Pt shared that she has spoken with her family and agreeable to SNF for rehab as she and family are unable to obtain 24 hour care.  CSW provided SNF bed offers available at this time. Current offers are [facility] and [facility]. Pt son expressed interest in [facility] who have not yet responded.  Pt son plans to tour facilities in order to assist pt in making decision.  Per pt, she anticipates that she will not be medically ready until early next week.  CSW to follow up re: decision for SNF.  Alison Murray, MSW, LCSW Clinical Social Work Weekend coverage [phone]

## 2014-05-26 NOTE — Progress Notes (Signed)
Physical Therapy Treatment Patient Details Name: Pamela [name] MRN: [id number] DOB: 10-14-1950 Today's Date: 05/26/2014    History of Present Illness 63 y.o. 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    PT Comments    Pt progressing, incr amb and activity tolerance today  Follow Up Recommendations  Supervision/Assistance - 24 hour;SNF     Equipment Recommendations  None recommended by PT    Recommendations for Other Services       Precautions / Restrictions Precautions Precautions: Fall Precaution Comments: pain in hands/wrist Restrictions Weight Bearing Restrictions: No    Mobility  Bed Mobility Overal bed mobility: Needs Assistance Bed Mobility: Supine to Sit     Supine to sit: Min assist     General bed mobility comments: min to bring trunk forward  Transfers Overall transfer level: Needs assistance Equipment used: Ambulation equipment used (EVA walker) Transfers: Sit to/from Stand Sit to Stand: Min assist;+2 safety/equipment         General transfer comment: assist with anterior -superior wt shift, pt uses momentum; repeated for activity tolerance  Ambulation/Gait Ambulation/Gait assistance: Min assist Ambulation Distance (Feet): 160 Feet Assistive device:  (EVA walker) Gait Pattern/deviations: Step-through pattern;Decreased stride length;Trunk flexed   Gait velocity interpretation: Below normal speed for age/gender General Gait Details: cues for posture, step length   Stairs            Wheelchair Mobility    Modified Rankin (Stroke Patients Only)       Balance Overall balance assessment: Needs assistance           Standing  balance-Leahy Scale: Poor Standing balance comment: requires at least unilateral UE support to maintain balance                    Cognition Arousal/Alertness: Awake/alert Behavior During Therapy: WFL for tasks assessed/performed Overall Cognitive Status: Within Functional Limits for tasks assessed                      Exercises      General Comments        Pertinent Vitals/Pain Pain Assessment: 0-10 Pain Score: 4  Pain Location: hands Pain Descriptors / Indicators: Aching Pain Intervention(s): Limited activity within patient's tolerance;Monitored during session    Home Living                      Prior Function            PT Goals (current goals can now be found in the care plan section) Acute Rehab PT Goals Patient Stated Goal: to return to independent PT Goal Formulation: With patient Time For Goal Achievement: 06/08/14 Potential to Achieve Goals: Good Progress towards PT goals: Progressing toward goals    Frequency  Min 3X/week    PT Plan Discharge plan needs to be updated    Co-evaluation             End of Session Equipment Utilized During Treatment: Gait belt Activity Tolerance: Patient tolerated treatment well Patient left: in chair;with call bell/phone within reach     Time: 1120-1148 PT Time Calculation (min): 28 min  Charges:  $Gait Training: 23-37 mins  G CodesKenyon [name] 05/26/2014, 1:37 PM

## 2014-05-26 NOTE — Progress Notes (Signed)
Pt refused CPAP for tonight. Pt states she does wear CPAP at home but has not worn it in a while since she has been sick. Pt was instructed to contact RT if she changed his mind. RT will continue to monitor as needed.

## 2014-05-27 DIAGNOSIS — I369 Nonrheumatic tricuspid valve disorder, unspecified: Secondary | ICD-10-CM

## 2014-05-27 DIAGNOSIS — D509 Iron deficiency anemia, unspecified: Secondary | ICD-10-CM | POA: Diagnosis present

## 2014-05-27 LAB — BASIC METABOLIC PANEL
ANION GAP: 11 (ref 5–15)
BUN: 49 mg/dL — ABNORMAL HIGH (ref 6–23)
CO2: 30 meq/L (ref 19–32)
Calcium: 8.7 mg/dL (ref 8.4–10.5)
Chloride: 95 mEq/L — ABNORMAL LOW (ref 96–112)
Creatinine, Ser: 1.79 mg/dL — ABNORMAL HIGH (ref 0.50–1.10)
GFR calc Af Amer: 34 mL/min — ABNORMAL LOW (ref >90)
GFR calc non Af Amer: 29 mL/min — ABNORMAL LOW (ref >90)
GLUCOSE: 91 mg/dL (ref 70–99)
POTASSIUM: 4.3 meq/L (ref 3.7–5.3)
SODIUM: 136 meq/L — AB (ref 137–147)

## 2014-05-27 LAB — GLUCOSE, CAPILLARY
Glucose-Capillary: 88 mg/dL (ref 70–99)
Glucose-Capillary: 118 mg/dL — ABNORMAL HIGH (ref 70–99)
Glucose-Capillary: 187 mg/dL — ABNORMAL HIGH (ref 70–99)
Glucose-Capillary: 159 mg/dL — ABNORMAL HIGH (ref 70–99)

## 2014-05-27 MED ORDER — FERROUS SULFATE 325 (65 FE) MG PO TABS
325.0000 mg | ORAL_TABLET | Freq: Two times a day (BID) | ORAL | Status: DC
  Administered 2014-05-27 – 2014-05-28 (×2): 325 mg via ORAL
  Filled 2014-05-27 (×4): qty 1

## 2014-05-27 NOTE — Progress Notes (Signed)
Pt refused CPAP for tonight. Pt was encouraged to contact RT in the event she changes her mind. RT will continue to monitor as needed.

## 2014-05-27 NOTE — Progress Notes (Signed)
  Echocardiogram 2D Echocardiogram has been performed.  Doyle Askew 05/27/2014, 9:59 AM

## 2014-05-27 NOTE — Progress Notes (Addendum)
[facility] PROGRESS NOTE  Pamela [name] GMW:[id number] DOB: May 19, 1951 DOA: 05/23/2014 PCP: MATTHEWS,MICHELLE A., MD  Brief narrative 63 year old morbidly obese female with history of ?COPD/ asthma,, sarcoidosis, right upper lobe lung cancer who recently relocated from [place] ( was following with a pulmonologist and oncologist there), OSA on CPAP,who presented with acute multiple joint pain worsened over past several days. Patient also found to have acute on chronic kidney disease.  Assessment/Plan: Acute polyarticular gouty arthritis high uric acid of 17.5 and admission. Clinical exam consistent with acute gout mainly involving her hands knees and ankles. patient had had polyarthritis for past 2 years off and on.? Could be related to her underlying sarcoid. She will need outpatient hematology referral. Continue oral prednisone with improvement in symptoms. Pain confined to right wrist and metacarpal joints. Avoid NSAIDs or colchicine given her worsening renal function.  -Hold HCTZ avoid worsening of uric acid level. - X-ray of the right hand shows degenerative changes. -pain control with when necessary Percocet and Dilaudid.  Acute on chronic kidney disease stage III Baseline creatinine of 1.3.renal function slowly improving. Possibly related to NSAIDs use at home (patient reportedly using Mobic and ibuprofen regularly for her joint pains). Instructed clearly on avoiding NSAIDs. Patient also on HCTZ which has been discontinued. hold Lasix.  -Renal ultrasound unremarkable. Avoid nephrotoxic agents.   History of CHF with chronic edema Appears euvolemic. Holding Lasix given acute kidney injury. 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 proBNP of 1200.  check 2-D echo to evaluate LV function  History of Asthma Continue Advair  OSA on CPAP Patient refused CPAP overnight   History of sarcoidosis On chronic  low-dose prednisone which was stopped by pulmonologist ( as per PCP). Need to verify with her pulmonologist about this. Polyarthritis could be related to sarcoidosis. 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   Hypertension Continue Aldactone. Hold HCTZ and Lasix for now  Hypokalemia Replenish  Morbid obesity Needs counseling on diet and exercise  Hx of right lung mass Status post radiation with CT chest results done 6 months back showing unchanged result. Follow as outpt  Anemia Iron panel suggests iron deficiency. Check stool for occult blood. Y40 and folic acid level normal. Will add iron supplement.  Prediabetes A1c of 6.4. Monitor on sliding scale insulin  DVT prophylaxis: Subcutaneous heparin  Diet: Cardiac  Code Status: full code   Family Communication: none at bedside   Disposition Plan: nursing facility possibly on 11/9   Consultants:  none  Procedures:  none  Antibiotics:  none  HPI/Subjective: Pain over right hand has improved.  Objective: Filed Vitals:   05/27/14 1004  BP: 104/59  Pulse:   Temp:   Resp:     Intake/Output Summary (Last 24 hours) at 05/27/14 1100 Last data filed at 05/26/14 1807  Gross per 24 hour  Intake    480 ml  Output    250 ml  Net    230 ml   Filed Weights   05/23/14 2225 05/24/14 0527 05/26/14 0532  Weight: 116.574 kg (257 lb) 169.645 kg (374 lb) 159.666 kg (352 lb)    Exam:   General:  no acute distress  HEENT: Moist oral mucosa  Chest: Clear to auscultation bilaterally  CVS: Normal S1 and S2, no murmurs  Abdomen: Soft, nondistended, nontender  Extremities: Deformed and swollen hand joints with minimal tenderness over  right wrist and metacarpals , no tenderness over knees and ankles  CNS: alert and oriented.  Data Reviewed: Basic Metabolic Panel:  Recent Labs Lab 05/24/14 0030 05/24/14 0700 05/25/14 0818 05/26/14 0524  05/27/14 0545  NA 134* 134* 136* 134* 136*  K 3.3* 3.4* 4.0 4.3 4.3  CL 87* 89* 91* 92* 95*  CO2 31 32 32 31 30  GLUCOSE 123* 112* 140* 133* 91  BUN 45* 42* 41* 48* 49*  CREATININE 2.96* 2.68* 2.07* 2.10* 1.79*  CALCIUM 9.5 9.2 9.0 8.5 8.7   Liver Function Tests:  Recent Labs Lab 05/24/14 0030  AST 20  ALT 9  ALKPHOS 169*  BILITOT 0.8  PROT 7.5  ALBUMIN 3.0*   No results for input(s): LIPASE, AMYLASE in the last 168 hours. No results for input(s): AMMONIA in the last 168 hours. CBC:  Recent Labs Lab 05/21/14 1716 05/24/14 0030 05/24/14 0700  WBC 7.3 10.2 10.1  NEUTROABS 4.6 8.0*  --   HGB 10.2* 9.1* 8.9*  HCT 30.9* 28.4* 27.8*  MCV 65.7* 67.6* 68.6*  PLT 284 209 191   Cardiac Enzymes: No results for input(s): CKTOTAL, CKMB, CKMBINDEX, TROPONINI in the last 168 hours. BNP (last 3 results)  Recent Labs  04/13/14 1234 05/24/14 0700  PROBNP 1144.00* 1235.0*   CBG:  Recent Labs Lab 05/26/14 0822 05/26/14 1337 05/26/14 1714 05/26/14 2139 05/27/14 0803  GLUCAP 155* 117* 173* 158* 88    Recent Results (from the past 240 hour(s))  Culture, blood (routine x 2)     Status: None (Preliminary result)   Collection Time: 05/24/14  7:00 AM  Result Value Ref Range Status   Specimen Description BLOOD RIGHT ARM  Final   Special Requests BOTTLES DRAWN AEROBIC AND ANAEROBIC 4CC  Final   Culture  Setup Time   Final    05/24/2014 10:59 Performed at [facility]    Culture   Final           BLOOD CULTURE RECEIVED NO GROWTH TO DATE CULTURE WILL BE HELD FOR 5 DAYS BEFORE ISSUING A FINAL NEGATIVE REPORT Performed at [facility]    Report Status PENDING  Incomplete  Culture, blood (routine x 2)     Status: None (Preliminary result)   Collection Time: 05/24/14  7:10 AM  Result Value Ref Range Status   Specimen Description BLOOD RIGHT ARM  Final   Special Requests BOTTLES DRAWN AEROBIC AND ANAEROBIC 6CC  Final   Culture  Setup Time   Final     05/24/2014 10:59 Performed at [facility]    Culture   Final           BLOOD CULTURE RECEIVED NO GROWTH TO DATE CULTURE WILL BE HELD FOR 5 DAYS BEFORE ISSUING A FINAL NEGATIVE REPORT Performed at [facility]    Report Status PENDING  Incomplete  GC/Chlamydia Probe Amp     Status: None   Collection Time: 05/24/14 10:02 AM  Result Value Ref Range Status   CT Probe RNA NEGATIVE NEGATIVE Final   GC Probe RNA NEGATIVE NEGATIVE Final    Comment: (NOTE)                                                                                       **  Normal Reference Range: Negative**      Assay performed using the Gen-Probe APTIMA COMBO2 (R) Assay. Acceptable specimen types for this assay include APTIMA Swabs (Unisex, endocervical, urethral, or vaginal), first void urine, and ThinPrep liquid based cytology samples. Performed at [facility]      Studies: No results found.  Scheduled Meds: . aspirin  81 mg Oral Daily  . docusate sodium  100 mg Oral BID  . heparin  5,000 Units Subcutaneous 3 times per day  . insulin aspart  0-15 Units Subcutaneous TID WC  . insulin aspart  0-5 Units Subcutaneous QHS  . mometasone-formoterol  2 puff Inhalation BID  . predniSONE  50 mg Oral Q breakfast  . sodium chloride  3 mL Intravenous Q12H  . sodium chloride  3 mL Intravenous Q12H  . spironolactone  25 mg Oral Daily   Continuous Infusions:    Time spent: 25 minutes    Pamela [name]  [facility] Pager [phone] If 7PM-7AM, please contact night-coverage at [email or web address], password [facility] 05/27/2014, 11:00 AM  LOS: 4 days

## 2014-05-28 ENCOUNTER — Institutional Professional Consult (permissible substitution): Payer: Medicare Other | Admitting: Cardiology

## 2014-05-28 DIAGNOSIS — M2500 Hemarthrosis, unspecified joint: Secondary | ICD-10-CM | POA: Diagnosis not present

## 2014-05-28 DIAGNOSIS — N183 Chronic kidney disease, stage 3 (moderate): Secondary | ICD-10-CM | POA: Diagnosis not present

## 2014-05-28 DIAGNOSIS — G4733 Obstructive sleep apnea (adult) (pediatric): Secondary | ICD-10-CM | POA: Diagnosis not present

## 2014-05-28 DIAGNOSIS — R635 Abnormal weight gain: Secondary | ICD-10-CM | POA: Diagnosis not present

## 2014-05-28 DIAGNOSIS — I10 Essential (primary) hypertension: Secondary | ICD-10-CM | POA: Diagnosis not present

## 2014-05-28 DIAGNOSIS — M1000 Idiopathic gout, unspecified site: Secondary | ICD-10-CM | POA: Diagnosis not present

## 2014-05-28 DIAGNOSIS — J4520 Mild intermittent asthma, uncomplicated: Secondary | ICD-10-CM | POA: Diagnosis not present

## 2014-05-28 DIAGNOSIS — M1990 Unspecified osteoarthritis, unspecified site: Secondary | ICD-10-CM | POA: Diagnosis not present

## 2014-05-28 DIAGNOSIS — M1711 Unilateral primary osteoarthritis, right knee: Secondary | ICD-10-CM | POA: Diagnosis not present

## 2014-05-28 DIAGNOSIS — D869 Sarcoidosis, unspecified: Secondary | ICD-10-CM | POA: Diagnosis not present

## 2014-05-28 DIAGNOSIS — C3490 Malignant neoplasm of unspecified part of unspecified bronchus or lung: Secondary | ICD-10-CM | POA: Diagnosis not present

## 2014-05-28 DIAGNOSIS — R600 Localized edema: Secondary | ICD-10-CM | POA: Diagnosis not present

## 2014-05-28 DIAGNOSIS — R7309 Other abnormal glucose: Secondary | ICD-10-CM | POA: Diagnosis not present

## 2014-05-28 DIAGNOSIS — D649 Anemia, unspecified: Secondary | ICD-10-CM | POA: Diagnosis not present

## 2014-05-28 DIAGNOSIS — I5032 Chronic diastolic (congestive) heart failure: Secondary | ICD-10-CM | POA: Diagnosis not present

## 2014-05-28 DIAGNOSIS — Z9981 Dependence on supplemental oxygen: Secondary | ICD-10-CM | POA: Diagnosis not present

## 2014-05-28 DIAGNOSIS — M2550 Pain in unspecified joint: Secondary | ICD-10-CM | POA: Diagnosis not present

## 2014-05-28 DIAGNOSIS — N179 Acute kidney failure, unspecified: Secondary | ICD-10-CM | POA: Diagnosis not present

## 2014-05-28 DIAGNOSIS — J45909 Unspecified asthma, uncomplicated: Secondary | ICD-10-CM | POA: Diagnosis not present

## 2014-05-28 DIAGNOSIS — M109 Gout, unspecified: Secondary | ICD-10-CM | POA: Diagnosis not present

## 2014-05-28 DIAGNOSIS — D509 Iron deficiency anemia, unspecified: Secondary | ICD-10-CM | POA: Diagnosis not present

## 2014-05-28 DIAGNOSIS — D860 Sarcoidosis of lung: Secondary | ICD-10-CM | POA: Diagnosis not present

## 2014-05-28 DIAGNOSIS — I5031 Acute diastolic (congestive) heart failure: Secondary | ICD-10-CM | POA: Diagnosis not present

## 2014-05-28 LAB — BASIC METABOLIC PANEL
ANION GAP: 14 (ref 5–15)
BUN: 51 mg/dL — AB (ref 6–23)
CALCIUM: 8.5 mg/dL (ref 8.4–10.5)
CO2: 27 mEq/L (ref 19–32)
CREATININE: 1.88 mg/dL — AB (ref 0.50–1.10)
Chloride: 95 mEq/L — ABNORMAL LOW (ref 96–112)
GFR calc non Af Amer: 28 mL/min — ABNORMAL LOW (ref >90)
GFR, EST AFRICAN AMERICAN: 32 mL/min — AB (ref >90)
Glucose, Bld: 103 mg/dL — ABNORMAL HIGH (ref 70–99)
Potassium: 4.7 mEq/L (ref 3.7–5.3)
Sodium: 136 mEq/L — ABNORMAL LOW (ref 137–147)

## 2014-05-28 LAB — GLUCOSE, CAPILLARY
Glucose-Capillary: 73 mg/dL (ref 70–99)
Glucose-Capillary: 112 mg/dL — ABNORMAL HIGH (ref 70–99)

## 2014-05-28 MED ORDER — PREDNISONE 20 MG PO TABS: 20.0000 mg | ORAL_TABLET | Freq: Every day | ORAL | Status: DC

## 2014-05-28 MED ORDER — OXYCODONE HCL 5 MG PO CAPS: 5.0000 mg | ORAL_CAPSULE | ORAL | Status: DC | PRN

## 2014-05-28 MED ORDER — FERROUS SULFATE 325 (65 FE) MG PO TABS: 325.0000 mg | ORAL_TABLET | Freq: Two times a day (BID) | ORAL | Status: DC

## 2014-05-28 NOTE — Progress Notes (Signed)
Occupational Therapy Treatment Patient Details Name: Pamela [name] MRN: [id number] DOB: [birth year] Today's Date: 05/28/2014    History of present illness 63 y.o. 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   OT comments  Pt much improved!  Follow Up Recommendations  SNF          Precautions / Restrictions Precautions Precautions: Fall Precaution Comments: pain in hands and wrist          Balance                                   ADL   Eating/Feeding: Minimal assistance Eating/Feeding Details (indicate cue type and reason): Much improved! Pt is using blue mug and built up utensil and is having success.  Pt says hands are sore with self feeding but she is working through it Grooming: Minimal assistance;Sitting Grooming Details (indicate cue type and reason): using BUE                                                 Cognition   Behavior During Therapy: [employer] for tasks assessed/performed Overall Cognitive Status: Within Functional Limits for tasks assessed                         Exercises General Exercises - Upper Extremity Elbow Flexion: AROM;Both;10 reps Elbow Extension: AROM;10 reps;Both Wrist Flexion: AROM;Both;10 reps Wrist Extension: AROM;10 reps Digit Composite Flexion: AROM;Both;10 reps Composite Extension: AROM;Both;10 reps   Shoulder Instructions       General Comments      Pertinent Vitals/ Pain       Pain Score: 5  Pain Location: both hands with ROM Pain Intervention(s): Monitored during session;Limited activity within patient's tolerance     Prior Functioning/Environment              Frequency Min  3X/week     Progress Toward Goals  OT Goals(current goals can now be found in the care plan section)  Progress towards OT goals: Progressing toward goals     Plan Discharge plan remains appropriate       End of Session     Activity Tolerance Patient tolerated treatment well   Patient Left in chair with call bell and lunch    Nurse Communication          Time: 6948-5462 OT Time Calculation (min): 12 min  Charges: OT General Charges $OT Visit: 1 Procedure OT Treatments $Therapeutic Activity: 8-22 mins  Jadyn Brasher, Edwena Felty D 05/28/2014, 12:21 PM

## 2014-05-28 NOTE — Progress Notes (Signed)
Pt prepared for d/c to [facility] (SNF). IV dc'c. Skin intact except as most recent charted. Vitals are stable. Report called [facility] at receiving facility. Pt to be transported by ambulance service.

## 2014-05-28 NOTE — Progress Notes (Signed)
Clinical Social Work  CSW faxed DC summary to [facility] who is agreeable to accept patient today.  CSW informed patient, dtr-in-law, and RN of DC plans and all parties agreeable. Patient's dtr-in-law will meet patient at SNF and is happy that she is going to a facility close to home. CSW prepared DC packet with FL2, DC summary, and hard scripts included. RN to call report. PTAR arranged per patient and family request who is aware of no guarantee of payment. PTAR #: [id number].  CSW is signing off but available if needed.  [place], [place] [phone]

## 2014-05-28 NOTE — Plan of Care (Signed)
Problem: Phase I Progression Outcomes Goal: OOB as tolerated unless otherwise ordered Outcome: Completed/Met Date Met:  05/28/14

## 2014-05-28 NOTE — Discharge Instructions (Signed)
Gout Gout is when your joints become red, sore, and swell (inflamed). This is caused by the buildup of uric acid crystals in the joints. Uric acid is a chemical that is normally in the blood. If the level of uric acid gets too high in the blood, these crystals form in your joints and tissues. Over time, these crystals can form into masses near the joints and tissues. These masses can destroy bone and cause the bone to look misshapen (deformed). HOME CARE   Do not take aspirin for pain.  Only take medicine as told by your doctor.  Rest the joint as much as you can. When in bed, keep sheets and blankets off painful areas.  Keep the sore joints raised (elevated).  Put warm or cold packs on painful joints. Use of warm or cold packs depends on which works best for you.  Use crutches if the painful joint is in your leg.  Drink enough fluids to keep your pee (urine) clear or pale yellow. Limit alcohol, sugary drinks, and drinks with fructose in them.  Follow your diet instructions. Pay careful attention to how much protein you eat. Include fruits, vegetables, whole grains, and fat-free or low-fat milk products in your daily diet. Talk to your doctor or dietitian about the use of coffee, vitamin C, and cherries. These may help lower uric acid levels.  Keep a healthy body weight. GET HELP RIGHT AWAY IF:   You have watery poop (diarrhea), throw up (vomit), or have any side effects from medicines.  You do not feel better in 24 hours, or you are getting worse.  Your joint becomes suddenly more tender, and you have chills or a fever. MAKE SURE YOU:   Understand these instructions.  Will watch your condition.  Will get help right away if you are not doing well or get worse. Document Released: 04/14/2008 Document Revised: 11/20/2013 Document Reviewed: 02/17/2012 [facility] Patient Information 2015 [place], [place]. This information is not intended to replace advice given to you by your health care  provider. Make sure you discuss any questions you have with your health care provider.

## 2014-05-28 NOTE — Discharge Summary (Addendum)
Physician Discharge Summary  Pamela [name] DDU:[id number] DOB: Dec 02, 1950 DOA: 05/23/2014  PCP: MATTHEWS,MICHELLE A., MD  Admit date: 05/23/2014 Discharge date: 05/28/2014  Time spent: 35 minutes   Recommendations for Outpatient Follow-up:  -Discharge to [facility] -Please follow repeat BMET in 3-4 days. Please avoid any NSAIDs.  -Follow-up with pulmonology in 4 weeks -pressure setting for CPAP is 15.   Discharge Diagnoses:  Principal Problem:   Acute gouty arthritis   Active Problems:   Prediabetes   Essential hypertension   Lower extremity edema   History of lung cancer   Morbid obesity   On home oxygen therapy   Anemia of chronic disease   Asthma, chronic   Joint pain   CHF (congestive heart failure)   Sarcoidosis   Acute renal failure superimposed on stage 3 chronic kidney disease   Obstructive sleep apnea, on CPAP   Anemia, iron deficiency   Discharge Condition: fair  Diet recommendation: heart healthy/diabetic  Filed Weights   05/24/14 0527 05/26/14 0532 05/28/14 0448  Weight: 169.645 kg (374 lb) 159.666 kg (352 lb) 162.841 kg (359 lb)    History of present illness:  63 year old morbidly obese female with history of asthma,, sarcoidosis(on chronic low-dose prednisone), right upper lobe lung cancer who recently relocated from [place] ( was following with a pulmonologist and oncologist there), OSA on CPAP,history of heart failure, who presented with acute multiple joint pain worsened over past several days likely in the setting of acute gout. Patient also found to have acute on chronic kidney disease.     Hospital Course:  Acute polyarticular gouty arthritis high uric acid of 17.5 and admission. Clinical exam consistent with acute gout mainly involving her hands, knees and ankles. patient had had polyarthritis for past 2 years off and on.? Could be related to her underlying sarcoid. She May benefit from  outpatient rheumatology  referral. -patient placed on oral prednisone 50 mg daily with clinical improvement.. Pain now confined to right wrist and metacarpal joints, with minimal tenderness, no swelling or erythema. -Avoid NSAIDs or colchicine . -Hold HCTZ given acute kidney injury and to avoid worsening of uric acid level. - X-ray of the right hand shows degenerative changes. -pain control with when necessary oxycodone upon discharge. -Patient will be tapered off prednisone over the next 10-12 days and will continue on 10 mg oral prednisone for her sarcoidosis thereafter.  Acute on chronic kidney disease stage III Baseline creatinine of 1.3.renal function slowly improving. Possibly related to NSAIDs use at home (patient reportedly using Mobic and ibuprofen regularly for her joint pains). Instructed clearly on avoiding NSAIDs. Patient also on HCTZ which has been discontinued. Lasix has been held as well. -Renal ultrasound unremarkable. Avoid nephrotoxic agents. -please recheck renal function in next 3-4 days. If improved to baseline she can be placed back on low-dose Lasix.   History of CHF with chronic edema Appears euvolemic. Holding Lasix given acute kidney injury. 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 Continue aspirin -patient was on Lasix and Aldactone prior to admission. proBNP of 1200. 2-D echo shows moderate concentric hypertrophy with vigorous systolic function, EF of 23-76% and grade 1 diastolic dysfunction. -I will discontinue her Aldactone given her normal EF. Once renal function is normal she can be placed back on low-dose Lasix. Blood pressure is currently stable and if needed she can be placed on low-dose amlodipine or hydralazine.   History of Asthma Stable . Continue dulera. Outpatient pulmonary follow-up  OSA on CPAP On CPAP at night.  She  has however been refusing CPAP while in the hospital. Agrees to use CPAP at SNF. Has a pressure  setting of 15 .   History of sarcoidosis On chronic low-dose  prednisone which was stopped by pulmonologist Dr Melvyn Novas during recent outpatient visit. Discussed with him this morning and clarified that was on prednisone for her sarcoid for several years now. He recommended to taper her prednisone to 10 mg daily and continue this dose. He will see her in the office in 2 weeks.   Hypertension Blood pressure stable. Discontinue Lasix and HCTZ given worsened renal function. Does not need Aldactone.  Hypokalemia Replenished  Morbid obesity and weakness Counseled on diet and exercise Seen by physical therapy and recommended skilled nursing facility for short-term rehabilitation.  Hx of right lung mass Status post radiation with CT chest results done 6 months back showing unchanged result. Follow as outpt  Anemia Iron panel suggests iron deficiency. X21 and folic acid level normal. added iron supplement.  Prediabetes A1c of 6.4. stable    Diet: Cardiac  Code Status: full code   Family Communication: none at bedside. Spoke with son during hospitalization   Disposition Plan: skilled nursing facility   Consultants:  none  Procedures:  none  Antibiotics:  None   Discharge Exam: Filed Vitals:   05/28/14 0448  BP: 112/75  Pulse: 51  Temp: 98.2 F (36.8 C)  Resp: 20     General: no acute distress  HEENT: Moist oral mucosa  Chest: Clear to auscultation bilaterally  CVS: Normal S1 and S2, no murmurs  Abdomen: Soft, nondistended, nontender  Extremities: Deformed and swollen hand joints with minimal tenderness over right wrist and metacarpals , no tenderness over knees and ankles  CNS: alert and oriented.  Discharge Instructions You were cared for by a hospitalist during your hospital stay. If you have any questions about your discharge medications or the care you received while you were in the hospital after you are discharged, you can call the unit and asked to speak with the hospitalist on call if the hospitalist  that took care of you is not available. Once you are discharged, your primary care physician will handle any further medical issues. Please note that NO REFILLS for any discharge medications will be authorized once you are discharged, as it is imperative that you return to your primary care physician (or establish a relationship with a primary care physician if you do not have one) for your aftercare needs so that they can reassess your need for medications and monitor your lab values.   Current Discharge Medication List    START taking these medications   Details  ferrous sulfate 325 (65 FE) MG tablet Take 1 tablet (325 mg total) by mouth 2 (two) times daily with a meal. Qty: 60 tablet, Refills: 0    predniSONE (DELTASONE) 20 MG tablet Take 1 tablet (20 mg total) by mouth daily with breakfast. Qty: 30 tablet, Refills: 0      CONTINUE these medications which have CHANGED   Details  oxycodone (OXY-IR) 5 MG capsule Take 1 capsule (5 mg total) by mouth every 4 (four) hours as needed for pain. Qty: 30 capsule, Refills: 0      CONTINUE these medications which have NOT CHANGED   Details  aspirin 81 MG tablet Take 81 mg by mouth daily.    Calcium Carbonate-Vitamin D (CALTRATE 600+D PO) Take 1 tablet by mouth 1 day or 1 dose.    docusate sodium (  COLACE) 100 MG capsule Take 100 mg by mouth 2 (two) times daily.    mometasone-formoterol (DULERA) 100-5 MCG/ACT AERO Take 2 puffs first thing in am and then another 2 puffs about 12 hours later.    OVER THE COUNTER MEDICATION Take 1,000 mg by mouth daily.    Specialty Vitamins Products (MAGNESIUM, AMINO ACID CHELATE,) 133 MG tablet Take 1 tablet by mouth 2 (two) times daily.      STOP taking these medications     furosemide (LASIX) 40 MG tablet      meloxicam (MOBIC) 7.5 MG tablet      ibuprofen (ADVIL,MOTRIN) 800 MG tablet      spironolactone-hydrochlorothiazide (ALDACTAZIDE) 25-25 MG per tablet        Allergies  Allergen Reactions   . Sulfur Rash   Follow-up Information    Follow up with Christinia Gully, MD. Schedule an appointment as soon as possible for a visit in 4 weeks.   Specialty:  Pulmonary Disease   Contact information:   [address] [place] [facility] [address] [phone]       Follow up with MATTHEWS,MICHELLE A., MD.   Specialty:  Internal Medicine   Why:  1 week after discharge from SNF   Contact information:   [place] [address] [phone]       Please follow up.   Why:  MD at SNF       The results of significant diagnostics from this hospitalization (including imaging, microbiology, ancillary and laboratory) are listed below for reference.    Significant Diagnostic Studies: US Renal  05/24/2014   CLINICAL DATA:  Acute kidney injury  EXAM: RENAL/URINARY TRACT ULTRASOUND COMPLETE  COMPARISON:  None.  FINDINGS: Right Kidney:  Length: 10.3 cm.  No mass or hydronephrosis.  Left Kidney:  Length: 10.9 cm.  No mass or hydronephrosis.  Bladder:  Mildly thick-walled but unremarkable.  IMPRESSION: Negative renal ultrasound.   Electronically Signed   By: Julian Hy M.D.   On: 05/24/2014 09:15   Dg Hand Complete Right  05/24/2014   CLINICAL DATA:  Increasing pain in the right hand over the past week. No known injury.  EXAM: RIGHT HAND - COMPLETE 3+ VIEW  COMPARISON:  None.  FINDINGS: Degenerative changes in the interphalangeal joints. Diffuse soft tissue swelling. Old ununited ossicle at the ulnar styloid process. Periarticular bone erosions are demonstrated at multiple proximal interphalangeal joints. Changes suggest erosive arthritis. No evidence of acute fracture or subluxation. Bone cortex and trabecular architecture appear intact. No radiopaque soft tissue foreign bodies.  IMPRESSION: Degenerative changes in the right hand. 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 Diffuse soft tissue swelling.   Electronically Signed   By: Lucienne Capers M.D.   On:  05/24/2014 03:44    Microbiology: Recent Results (from the past 240 hour(s))  Culture, blood (routine x 2)     Status: None (Preliminary result)   Collection Time: 05/24/14  7:00 AM  Result Value Ref Range Status   Specimen Description BLOOD RIGHT ARM  Final   Special Requests BOTTLES DRAWN AEROBIC AND ANAEROBIC 4CC  Final   Culture  Setup Time   Final    05/24/2014 10:59 Performed at [facility]    Culture   Final           BLOOD CULTURE RECEIVED NO GROWTH TO DATE CULTURE WILL BE HELD FOR 5 DAYS BEFORE ISSUING A FINAL NEGATIVE REPORT Performed at [facility]  Report Status PENDING  Incomplete  Culture, blood (routine x 2)     Status: None (Preliminary result)   Collection Time: 05/24/14  7:10 AM  Result Value Ref Range Status   Specimen Description BLOOD RIGHT ARM  Final   Special Requests BOTTLES DRAWN AEROBIC AND ANAEROBIC 6CC  Final   Culture  Setup Time   Final    05/24/2014 10:59 Performed at [facility]    Culture   Final           BLOOD CULTURE RECEIVED NO GROWTH TO DATE CULTURE WILL BE HELD FOR 5 DAYS BEFORE ISSUING A FINAL NEGATIVE REPORT Performed at [facility]    Report Status PENDING  Incomplete  GC/Chlamydia Probe Amp     Status: None   Collection Time: 05/24/14 10:02 AM  Result Value Ref Range Status   CT Probe RNA NEGATIVE NEGATIVE Final   GC Probe RNA NEGATIVE NEGATIVE Final    Comment: (NOTE)                                                                                       **Normal Reference Range: Negative**      Assay performed using the Gen-Probe APTIMA COMBO2 (R) Assay. Acceptable specimen types for this assay include APTIMA Swabs (Unisex, endocervical, urethral, or vaginal), first void urine, and ThinPrep liquid based cytology samples. Performed at [facility]: Basic Metabolic Panel:  Recent Labs Lab 05/24/14 0700 05/25/14 0818 05/26/14 0524 05/27/14 0545 05/28/14 0445  NA  134* 136* 134* 136* 136*  K 3.4* 4.0 4.3 4.3 4.7  CL 89* 91* 92* 95* 95*  CO2 32 32 31 30 27   GLUCOSE 112* 140* 133* 91 103*  BUN 42* 41* 48* 49* 51*  CREATININE 2.68* 2.07* 2.10* 1.79* 1.88*  CALCIUM 9.2 9.0 8.5 8.7 8.5   Liver Function Tests:  Recent Labs Lab 05/24/14 0030  AST 20  ALT 9  ALKPHOS 169*  BILITOT 0.8  PROT 7.5  ALBUMIN 3.0*   No results for input(s): LIPASE, AMYLASE in the last 168 hours. No results for input(s): AMMONIA in the last 168 hours. CBC:  Recent Labs Lab 05/21/14 1716 05/24/14 0030 05/24/14 0700  WBC 7.3 10.2 10.1  NEUTROABS 4.6 8.0*  --   HGB 10.2* 9.1* 8.9*  HCT 30.9* 28.4* 27.8*  MCV 65.7* 67.6* 68.6*  PLT 284 209 191   Cardiac Enzymes: No results for input(s): CKTOTAL, CKMB, CKMBINDEX, TROPONINI in the last 168 hours. BNP: BNP (last 3 results)  Recent Labs  04/13/14 1234 05/24/14 0700  PROBNP 1144.00* 1235.0*   CBG:  Recent Labs Lab 05/27/14 0803 05/27/14 1142 05/27/14 1714 05/27/14 2124 05/28/14 0734  GLUCAP 88 118* 187* 159* 73       Signed:  Sharica Roedel  [facility] 05/28/2014, 10:44 AM

## 2014-05-28 NOTE — Progress Notes (Signed)
Physical Therapy Treatment Patient Details Name: Pamela [name] MRN: [id number] DOB: Dec 18, 1950 Today's Date: 05/28/2014    History of Present Illness 63 y.o. 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    PT Comments    Progressing,incr activity tol  Follow Up Recommendations  Supervision/Assistance - 24 hour;SNF     Equipment Recommendations  None recommended by PT    Recommendations for Other Services       Precautions / Restrictions Precautions Precautions: Fall Precaution Comments: pain in hands and wrist    Mobility  Bed Mobility Overal bed mobility: Needs Assistance Bed Mobility: Supine to Sit           General bed mobility comments: min to bring trunk forward  Transfers Overall transfer level: Needs assistance Equipment used: Ambulation equipment used (EVA walker) Transfers: Sit to/from [facility] Sit to Stand: Min assist;From elevated surface Stand pivot transfers: Min assist       General transfer comment: assist with anterior -superior wt shift, pt uses momentum; repeated for activity tolerance; repeated transfers to work on [employer] use of  momentum  Ambulation/Gait Ambulation/Gait assistance: Min assist;Min guard Ambulation Distance (Feet): 100 Feet Assistive device:  (EVA) Gait Pattern/deviations: Step-through pattern;Decreased stride length;Trunk flexed         Stairs            Wheelchair Mobility    Modified Rankin (Stroke Patients Only)       Balance Overall balance assessment: Needs assistance           Standing balance-Leahy Scale: Fair                      Cognition  Arousal/Alertness: Awake/alert Behavior During Therapy: WFL for tasks assessed/performed Overall Cognitive Status: Within Functional Limits for tasks assessed                      Exercises General Exercises - Upper Extremity Elbow Flexion: AROM;Both;10 reps Elbow Extension: AROM;10 reps;Both Wrist Flexion: AROM;Both;10 reps Wrist Extension: AROM;10 reps Digit Composite Flexion: AROM;Both;10 reps Composite Extension: AROM;Both;10 reps General Exercises - Lower Extremity Ankle Circles/Pumps: AROM;Both;20 reps Heel Slides: AROM;Strengthening;Both;15 reps    General Comments        Pertinent Vitals/Pain Pain Assessment: 0-10 Pain Score: 6  Pain Location: bil hands Pain Intervention(s): Limited activity within patient's tolerance;Monitored during session    Home Living                      Prior Function            PT Goals (current goals can now be found in the care plan section) Acute Rehab PT Goals Patient Stated Goal: to return to independent Time For Goal Achievement: 06/08/14 Potential to Achieve Goals: Good Progress towards PT goals: Progressing toward goals    Frequency  Min 3X/week    PT Plan Current plan remains appropriate    Co-evaluation             End of Session Equipment Utilized During Treatment: Gait belt Activity Tolerance: Patient tolerated treatment well Patient left: in chair;with call bell/phone within reach     Time: 4132-4401 PT Time Calculation (min): 36 min  Charges:  $  Gait Training: 23-37 mins                    G Codes:      Pamela [name] 06/26/14, 1:51 PM

## 2014-05-29 ENCOUNTER — Ambulatory Visit: Payer: Medicare Other | Admitting: Internal Medicine

## 2014-05-29 ENCOUNTER — Encounter: Payer: Self-pay | Admitting: Internal Medicine

## 2014-05-29 ENCOUNTER — Non-Acute Institutional Stay (SKILLED_NURSING_FACILITY): Payer: Medicare Other | Admitting: Internal Medicine

## 2014-05-29 DIAGNOSIS — J45909 Unspecified asthma, uncomplicated: Secondary | ICD-10-CM

## 2014-05-29 DIAGNOSIS — D869 Sarcoidosis, unspecified: Secondary | ICD-10-CM

## 2014-05-29 DIAGNOSIS — G4733 Obstructive sleep apnea (adult) (pediatric): Secondary | ICD-10-CM

## 2014-05-29 DIAGNOSIS — Z9981 Dependence on supplemental oxygen: Secondary | ICD-10-CM | POA: Diagnosis not present

## 2014-05-29 DIAGNOSIS — M109 Gout, unspecified: Secondary | ICD-10-CM | POA: Diagnosis not present

## 2014-05-29 DIAGNOSIS — N183 Chronic kidney disease, stage 3 unspecified: Secondary | ICD-10-CM

## 2014-05-29 DIAGNOSIS — I5032 Chronic diastolic (congestive) heart failure: Secondary | ICD-10-CM | POA: Diagnosis not present

## 2014-05-29 DIAGNOSIS — N179 Acute kidney failure, unspecified: Secondary | ICD-10-CM | POA: Diagnosis not present

## 2014-05-29 MED ORDER — PREDNISONE 20 MG PO TABS: 20.0000 mg | ORAL_TABLET | Freq: Every day | ORAL | Status: DC

## 2014-05-29 NOTE — Progress Notes (Signed)
Patient ID: Pamela [name], female   DOB: Aug 24, 1950, 63 y.o.   MRN: [id number]  Provider:  Rexene Edison. Mariea Clonts, D.O., C.M.D. Location:  [facility]  PCP: MATTHEWS,MICHELLE A., MD  Code Status: full code  Allergies  Allergen Reactions  . Sulfur Rash    Chief Complaint  Patient presents with  . New Admit To SNF    HPI: 63 y.o. female with morbid obesity, history of asthma, sarcoidosis (on chronic low-dose prednisone), right upper lobe lung cancer who recently relocated from [place] (was following with a pulmonologist and oncologist there), OSA on CPAP, history of heart failure, who presented 05/23/14  with acute multiple joint pain worsened over several days likely in the setting of acute gout. Patient also found to have acute on chronic kidney disease.  Her uric acid was 17.5 at admission to the hospital.  She was placed on prednisone 4m daily.  She cannot take nsaids or colchicine due to her renal failure.  Right hand xray showed degenerative changes.  She has prn oxycodone.  She is to continue on prednisone 14mdaily for her sarcoid after she completes the taper for her acute gout (on 2046mt this time and should be for 10 more days.  She is to f/u with Dr. WerMelvyn NovasHer hctz was held due to worsening renal function and gouty flare.  Her renal us [place] negative.  In terms of her chronic diastolic chf, her echo showed moderate concentric hypertrophy with EF 65-70%. Her aldactone was stopped due to normal renal EF and lasix remains on hold.  It may need to be restarted. She is on CPAP at hs at 15cm water.  She was discharged here for short term rehab 05/28/14.     ROS: Review of Systems  Constitutional: Negative for fever, chills and malaise/fatigue.  HENT: Negative for congestion.   Eyes: Negative for blurred vision.  Respiratory: Negative for cough, shortness of breath and wheezing.   Cardiovascular: Positive for leg swelling. Negative for chest pain and palpitations.    Gastrointestinal: Negative for abdominal pain, constipation, blood in stool and melena.  Genitourinary: Negative for dysuria.  Musculoskeletal: Negative for myalgias and falls.  Skin: Negative for rash.  Neurological: Positive for weakness. Negative for dizziness.  Endo/Heme/Allergies: Does not bruise/bleed easily.  Psychiatric/Behavioral: Negative for memory loss.    Past Medical History  Diagnosis Date  . CHF (congestive heart failure)   . Asthma   . COPD (chronic obstructive pulmonary disease)   . Arthritis   . Sarcoidosis   . Cancer     lung  . Hypertension   . Sleep apnea   . Oxygen deficiency    Past Surgical History  Procedure Laterality Date  . Lung biopsy    . Tubal ligation    . Vein ligation and stripping     Social History:   reports that she has never smoked. She has never used smokeless tobacco. She reports that she does not drink alcohol or use illicit drugs.  Family History  Problem Relation Age of Onset  . Hypertension Mother   . Renal Disease Mother   . Cancer Father   . Heart disease Father   . Cancer Brother   . Diabetes Brother   . Cervical cancer Sister   . Diabetes Sister   . Multiple myeloma Sister     Medications: Patient's Medications  New Prescriptions   No medications on file  Previous Medications   ASPIRIN 81 MG TABLET  Take 81 mg by mouth daily.   CALCIUM CARBONATE-VITAMIN D (CALTRATE 600+D PO)    Take 1 tablet by mouth 1 day or 1 dose.   DOCUSATE SODIUM (COLACE) 100 MG CAPSULE    Take 100 mg by mouth 2 (two) times daily.   FERROUS SULFATE 325 (65 FE) MG TABLET    Take 1 tablet (325 mg total) by mouth 2 (two) times daily with a meal.   MOMETASONE-FORMOTEROL (DULERA) 100-5 MCG/ACT AERO    Take 2 puffs first thing in am and then another 2 puffs about 12 hours later.   OVER THE COUNTER MEDICATION    Take 1,000 mg by mouth daily.   OXYCODONE (OXY-IR) 5 MG CAPSULE    Take 1 capsule (5 mg total) by mouth every 4 (four) hours as needed  for pain.   PREDNISONE (DELTASONE) 20 MG TABLET    Take 1 tablet (20 mg total) by mouth daily with breakfast.   SPECIALTY VITAMINS PRODUCTS (MAGNESIUM, AMINO ACID CHELATE,) 133 MG TABLET    Take 1 tablet by mouth 2 (two) times daily.  Modified Medications   No medications on file  Discontinued Medications   No medications on file     Physical Exam: Filed Vitals:   05/29/14 1131  BP: 101/61  Pulse: 67  Temp: 97.7 F (36.5 C)  Resp: 20  SpO2: 96%  Physical Exam  Constitutional: She is oriented to person, place, and time.  Obese black female, NAD  HENT:  Head: Normocephalic and atraumatic.  Right Ear: External ear normal.  Left Ear: External ear normal.  Nose: Nose normal.  Mouth/Throat: Oropharynx is clear and moist.  Eyes: Conjunctivae and EOM are normal. Pupils are equal, round, and reactive to light.  Neck: Normal range of motion. Neck supple. No JVD present.  Cardiovascular: Normal rate, regular rhythm, normal heart sounds and intact distal pulses.   Chronic venous stasis, no pitting edema present  Pulmonary/Chest: Effort normal. She has wheezes. She has no rales.  Abdominal: Soft. Bowel sounds are normal. She exhibits no distension and no mass. There is no tenderness.  Musculoskeletal: Normal range of motion. She exhibits no tenderness.  Neurological: She is alert and oriented to person, place, and time.  Skin: Skin is warm and dry.  Psychiatric: She has a normal mood and affect.     Labs reviewed: Basic Metabolic Panel:  Recent Labs  05/26/14 0524 05/27/14 0545 05/28/14 0445  NA 134* 136* 136*  K 4.3 4.3 4.7  CL 92* 95* 95*  CO2 '31 30 27  ' GLUCOSE 133* 91 103*  BUN 48* 49* 51*  CREATININE 2.10* 1.79* 1.88*  CALCIUM 8.5 8.7 8.5   Liver Function Tests:  Recent Labs  04/13/14 1234 05/24/14 0030  AST 23 20  ALT 26 9  ALKPHOS 240* 169*  BILITOT 0.5 0.8  PROT 6.7 7.5  ALBUMIN 3.8 3.0*   No results for input(s): LIPASE, AMYLASE in the last 8760  hours. No results for input(s): AMMONIA in the last 8760 hours. CBC:  Recent Labs  04/13/14 1234 05/21/14 1716 05/24/14 0030 05/24/14 0700  WBC 6.9 7.3 10.2 10.1  NEUTROABS 4.3 4.6 8.0*  --   HGB 11.1* 10.2* 9.1* 8.9*  HCT 33.8* 30.9* 28.4* 27.8*  MCV 66.7* 65.7* 67.6* 68.6*  PLT 189 284 209 191  CBG:  Recent Labs  05/27/14 2124 05/28/14 0734 05/28/14 1209  GLUCAP 159* 73 112*   Imaging and Procedures: 05/24/14:  Right hand xrays:Degenerative changes in the right  hand. Erosive arthritic changes at  the interphalangeal joints. Diffuse soft tissue swelling.   05/24/14:  Renal US:  Negative  Assessment/Plan 1. Acute gouty arthritis - completing increased dose prednisone taper--currently on the 30m -pain much improved -will then need to be on a preventive medication (had been using cherry vitamin, but apparently was inadequate)  2. Morbid obesity -is limiting factor with functionality and does not help her respiratory state -weight loss encouraged  3. Sarcoidosis -when completes prednisone 22m she will go back to 1061mndefinitely for this and f/u with Dr. WerMelvyn Novas. On home oxygen therapy -cont home oxygen for #3, requirements are stable  5. Acute renal failure superimposed on stage 3 chronic kidney disease -has resolved, drinking well and not on any nephrotoxic meds at present Repeat BMET, Please avoid any NSAIDs.   6. Asthma, chronic, unspecified asthma severity, uncomplicated -stable, cont O2, dulera, low dose prednisone when taper completed --Follow-up with pulmonology in 4 weeks  7. Obstructive sleep apnea -cont cpap at hs --pressure setting for CPAP is 15.  8. Chronic diastolic CHF (congestive heart failure) -noted, monitor weights daily and limit sodium and fluid intake to prevent exacerbation - if weight goes up or she becomes more dyspneic, may need to restart her lasix  Functional status:  Currently adls  Family/ staff Communication:  discuss  Labs/tests ordered:  bmp

## 2014-05-30 ENCOUNTER — Non-Acute Institutional Stay (SKILLED_NURSING_FACILITY): Payer: Medicare Other | Admitting: Internal Medicine

## 2014-05-30 ENCOUNTER — Institutional Professional Consult (permissible substitution): Payer: Medicare Other | Admitting: Pulmonary Disease

## 2014-05-30 DIAGNOSIS — R635 Abnormal weight gain: Secondary | ICD-10-CM

## 2014-05-30 LAB — CULTURE, BLOOD (ROUTINE X 2)
Culture: NO GROWTH
Culture: NO GROWTH

## 2014-06-01 NOTE — Progress Notes (Signed)
Patient ID: Pamela [name], female   DOB: January 21, 1951, 63 y.o.   MRN: [id number]               PROGRESS NOTE  DATE:  05/30/2014     FACILITY: [facility]    LEVEL OF CARE:   SNF   Acute Visit   CHIEF COMPLAINT:  Weight gain.    HISTORY OF PRESENT ILLNESS:  Pamela [name] is a lady who just came to [place] two days ago from [facility].  She had an exacerbation of acute gouty arthritis.  She received high-dose steroids with considerable improvement.  Her uric acid level apparently was 17.5.  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  She also has sarcoidosis.  She had acute renal insufficiency that improved.  Her hydrochlorothiazide was put on hold as a possible cause of worsening of her uric acid.  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    Since her arrival in the facility, she has apparently gained eight pounds, going from 346 to 353.     PHYSICAL EXAMINATION:   GENERAL APPEARANCE:  The patient is not in any distress.  Her oxygen is on.   CHEST/RESPIRATORY:  Shallow, but otherwise clear air entry.   CARDIOVASCULAR:  CARDIAC:   Heart sounds are normal.  JVP is not elevated.   EDEMA/VARICOSITIES:  She has severe bilateral venous stasis, probable lymphedema.  However, there is no pitting coccyx edema and no edema that I could detect easily in her legs.   MUSCULOSKELETAL:   EXTREMITIES:   BILATERAL UPPER EXTREMITIES:  She still has reasonably intense synovitis in her metacarpophalangeals and also both wrists.  I did not see any gouty tophi.    ASSESSMENT/PLAN:  Weight gain.  At this point, I am not going to do anything further about this other than continuing to monitor her.  If she continues to gain weight, it is likely fluid retention from her steroids.  She is on prednisone 20 mg for another six days, then 10 mg.  She will need a xanthine  oxidase inhibitor, probably Uloric, given her renal insufficiency.  I do not want to give her any diuretics at this point.  She was on Lasix and aldactone prior to admission.

## 2014-06-05 ENCOUNTER — Encounter: Payer: Self-pay | Admitting: Internal Medicine

## 2014-06-05 ENCOUNTER — Non-Acute Institutional Stay (SKILLED_NURSING_FACILITY): Payer: Medicare Other | Admitting: Internal Medicine

## 2014-06-05 DIAGNOSIS — G4733 Obstructive sleep apnea (adult) (pediatric): Secondary | ICD-10-CM | POA: Diagnosis not present

## 2014-06-05 DIAGNOSIS — D869 Sarcoidosis, unspecified: Secondary | ICD-10-CM

## 2014-06-05 DIAGNOSIS — I5032 Chronic diastolic (congestive) heart failure: Secondary | ICD-10-CM | POA: Diagnosis not present

## 2014-06-05 DIAGNOSIS — R635 Abnormal weight gain: Secondary | ICD-10-CM

## 2014-06-05 DIAGNOSIS — M109 Gout, unspecified: Secondary | ICD-10-CM

## 2014-06-05 NOTE — Progress Notes (Signed)
Patient ID: Pamela [name], female   DOB: 1951-01-05, 63 y.o.   MRN: [id number]  Location:  [facility] [place] Clonts, D.O., C.M.D.  PCP: MATTHEWS,MICHELLE A., MD  Code Status: full code  Allergies  Allergen Reactions  . Sulfur Rash    Chief Complaint  Patient presents with  . Discharge Note    home with home health PT, OT    HPI:  63 yo female here for short term rehab s/p hospitalization with generalized gout flare and here due to deconditioning and weakness.    Review of hospitalization:  63 yo female with morbid obesity, history of asthma, sarcoidosis (on chronic low-dose prednisone), right upper lobe lung cancer who recently relocated from [place] (was following with a pulmonologist and oncologist there), OSA on CPAP, history of heart failure, who presented 05/23/14 with acute multiple joint pain worsened over several days likely in the setting of acute gout. Patient also found to have acute on chronic kidney disease. Her uric acid was 17.5 at admission to the hospital. She was placed on prednisone 30m daily. She cannot take nsaids or colchicine due to her renal failure. Right hand xray showed degenerative changes. She has prn oxycodone. She is to continue on prednisone 129mdaily for her sarcoid after she completes the taper for her acute gout (on 2061mt this time and should be for 10 more days. She is to f/u with Dr. WerMelvyn Novaser hctz was held due to worsening renal function and gouty flare. Her renal us [place] negative. In terms of her chronic diastolic chf, her echo showed moderate concentric hypertrophy with EF 65-70%. Her aldactone was stopped due to normal renal EF and lasix remains on hold. It may need to be restarted. She is on CPAP at hs at 15cm water. She was discharged here for short term rehab 05/28/14.   She gained weight while here--this is thought to be due to hctz being stopped (gout flare) and use of prednisone taper (72m86mw and will  finish before leaving), then will be on prednisone 10mg7mly thereafter.  She remains on her chronic O2 at 2 L.    Review of Systems:  Review of Systems  Constitutional: Negative for fever and chills.       Weight gain  HENT: Negative for congestion.   Eyes: Negative for blurred vision.  Respiratory: Negative for shortness of breath.        On chronic oxygen due to her sarcoidosis  Cardiovascular: Negative for chest pain.  Gastrointestinal: Negative for abdominal pain, constipation, blood in stool and melena.  Genitourinary: Negative for dysuria.  Musculoskeletal: Positive for myalgias.       Aches and pains much better, using pain med once a day  Neurological: Negative for dizziness.  Psychiatric/Behavioral: Negative for memory loss.    Past Medical History  Diagnosis Date  . CHF (congestive heart failure)   . Asthma   . COPD (chronic obstructive pulmonary disease)   . Arthritis   . Sarcoidosis   . Cancer     lung  . Hypertension   . Sleep apnea   . Oxygen deficiency     Past Surgical History  Procedure Laterality Date  . Lung biopsy    . Tubal ligation    . Vein ligation and stripping      Social History:   reports that she has never smoked. She has never used smokeless tobacco. She reports that she does not drink alcohol or use illicit drugs.  Family History  Problem Relation Age of Onset  . Hypertension Mother   . Renal Disease Mother   . Cancer Father   . Heart disease Father   . Cancer Brother   . Diabetes Brother   . Cervical cancer Sister   . Diabetes Sister   . Multiple myeloma Sister     Medications: Patient's Medications  New Prescriptions   No medications on file  Previous Medications   ASPIRIN 81 MG TABLET    Take 81 mg by mouth daily.   CALCIUM CARBONATE-VITAMIN D (CALTRATE 600+D PO)    Take 1 tablet by mouth 1 day or 1 dose.   DOCUSATE SODIUM (COLACE) 100 MG CAPSULE    Take 100 mg by mouth 2 (two) times daily.   FERROUS SULFATE 325 (65  FE) MG TABLET    Take 1 tablet (325 mg total) by mouth 2 (two) times daily with a meal.   MOMETASONE-FORMOTEROL (DULERA) 100-5 MCG/ACT AERO    Take 2 puffs first thing in am and then another 2 puffs about 12 hours later.   OVER THE COUNTER MEDICATION    Take 1,000 mg by mouth daily.   OXYCODONE (OXY-IR) 5 MG CAPSULE    Take 1 capsule (5 mg total) by mouth every 4 (four) hours as needed for pain.   PREDNISONE (DELTASONE) 20 MG TABLET    Take 1 tablet (20 mg total) by mouth daily with breakfast. For 10 days for gout, then return to 31m daily thereafter for sarcoid (until assessed by Dr. WMelvyn Novas   [place](MAGNESIUM, AMINO ACID CHELATE,) 133 MG TABLET    Take 1 tablet by mouth 2 (two) times daily.  Modified Medications   No medications on file  Discontinued Medications   No medications on file    Physical Exam: Filed Vitals:   06/05/14 1343  BP: 122/55  Pulse: 59  Temp: 97.9 F (36.6 C)  Resp: 18  Height: '5\' 6"'  (1.676 m)  Weight: 356 lb (161.481 kg)  SpO2: 98%  Physical Exam  Constitutional: She is oriented to person, place, and time. She appears well-developed and well-nourished. No distress.  Cardiovascular: Normal rate, regular rhythm, normal heart sounds and intact distal pulses.   Pulmonary/Chest: Effort normal and breath sounds normal. She has no wheezes. She has no rales.  Wearing O2 at 2L  Abdominal: Soft. Bowel sounds are normal. She exhibits no distension and no mass. There is no tenderness.  Musculoskeletal: Normal range of motion.  Obese black female seated in wheelchair  Neurological: She is alert and oriented to person, place, and time.  Skin: Skin is warm and dry.    Labs reviewed: Basic Metabolic Panel:  Recent Labs  05/26/14 0524 05/27/14 0545 05/28/14 0445  NA 134* 136* 136*  K 4.3 4.3 4.7  CL 92* 95* 95*  CO2 '31 30 27  ' GLUCOSE 133* 91 103*  BUN 48* 49* 51*  CREATININE 2.10* 1.79* 1.88*  CALCIUM 8.5 8.7 8.5   Liver Function  Tests:  Recent Labs  04/13/14 1234 05/24/14 0030  AST 23 20  ALT 26 9  ALKPHOS 240* 169*  BILITOT 0.5 0.8  PROT 6.7 7.5  ALBUMIN 3.8 3.0*   No results for input(s): LIPASE, AMYLASE in the last 8760 hours. No results for input(s): AMMONIA in the last 8760 hours. CBC:  Recent Labs  04/13/14 1234 05/21/14 1716 05/24/14 0030 05/24/14 0700  WBC 6.9 7.3 10.2 10.1  NEUTROABS 4.3 4.6 8.0*  --  HGB 11.1* 10.2* 9.1* 8.9*  HCT 33.8* 30.9* 28.4* 27.8*  MCV 66.7* 65.7* 67.6* 68.6*  PLT 189 284 209 191   Cardiac Enzymes: No results for input(s): CKTOTAL, CKMB, CKMBINDEX, TROPONINI in the last 8760 hours. BNP: Invalid input(s): POCBNP CBG:  Recent Labs  05/27/14 2124 05/28/14 0734 05/28/14 1209  GLUCAP 159* 73 112*   Procedures and Imaging Studies:  See H&P  Assessment/Plan:   1. Sarcoidosis - cont prednisone--completes 28m pills here and will be sent home with the 147mpills daily indefinitely, f/u with pulmonary  2. Chronic diastolic CHF (congestive heart failure) -stable, no rales, doing well despite weight gain--felt to be steroid induced -cont to monitor at home and avoid sodium  3. Obstructive sleep apnea -cont cpap  4. Morbid obesity -cont to work on weight loss with increased exercise as tolerates and watching diet  5. Acute gouty arthritis -seems this acute flare has resolved -put on uloric 4078mnd f/u uric acid in about 6 wks with PCP  6. Weight gain -steroid induced, no s/s of acute chf so monitor -if continues upward, would consider resuming lasix or aldactone therapy  Patient is being discharged with home health services:  PT, OT  Patient is being discharged with the following durable medical equipment:  Requests new wheelchair (hers is 15 39ars old) and shower bench.  Continues on her home O2 she has had longterm.  Patient has been advised to f/u with their PCP in 1-2 weeks to bring them up to date on their rehab stay.  They were provided with  a 30 day supply of scripts for prescription medications and refills must be obtained from their PCP.    Labs/tests ordered:  F/u uric acid in about 6 wks to see if uloric dose requires adjustment

## 2014-06-11 ENCOUNTER — Emergency Department (HOSPITAL_COMMUNITY): Payer: Medicare Other

## 2014-06-11 ENCOUNTER — Encounter (HOSPITAL_COMMUNITY): Payer: Self-pay | Admitting: Emergency Medicine

## 2014-06-11 ENCOUNTER — Inpatient Hospital Stay (HOSPITAL_COMMUNITY)
Admission: EM | Admit: 2014-06-11 | Discharge: 2014-06-15 | DRG: 292 | Disposition: A | Discharge status: Skilled Nursing Facility | Payer: Medicare Other | Attending: Internal Medicine | Admitting: Internal Medicine

## 2014-06-11 DIAGNOSIS — C3490 Malignant neoplasm of unspecified part of unspecified bronchus or lung: Secondary | ICD-10-CM | POA: Diagnosis not present

## 2014-06-11 DIAGNOSIS — Z923 Personal history of irradiation: Secondary | ICD-10-CM

## 2014-06-11 DIAGNOSIS — J45909 Unspecified asthma, uncomplicated: Secondary | ICD-10-CM | POA: Diagnosis present

## 2014-06-11 DIAGNOSIS — M79606 Pain in leg, unspecified: Secondary | ICD-10-CM | POA: Diagnosis not present

## 2014-06-11 DIAGNOSIS — M1A9XX0 Chronic gout, unspecified, without tophus (tophi): Secondary | ICD-10-CM | POA: Diagnosis present

## 2014-06-11 DIAGNOSIS — J449 Chronic obstructive pulmonary disease, unspecified: Secondary | ICD-10-CM | POA: Diagnosis present

## 2014-06-11 DIAGNOSIS — I872 Venous insufficiency (chronic) (peripheral): Secondary | ICD-10-CM | POA: Diagnosis present

## 2014-06-11 DIAGNOSIS — Z7952 Long term (current) use of systemic steroids: Secondary | ICD-10-CM | POA: Diagnosis not present

## 2014-06-11 DIAGNOSIS — Z7982 Long term (current) use of aspirin: Secondary | ICD-10-CM

## 2014-06-11 DIAGNOSIS — Z9981 Dependence on supplemental oxygen: Secondary | ICD-10-CM

## 2014-06-11 DIAGNOSIS — M1990 Unspecified osteoarthritis, unspecified site: Secondary | ICD-10-CM | POA: Diagnosis present

## 2014-06-11 DIAGNOSIS — I5030 Unspecified diastolic (congestive) heart failure: Secondary | ICD-10-CM | POA: Diagnosis not present

## 2014-06-11 DIAGNOSIS — I5020 Unspecified systolic (congestive) heart failure: Secondary | ICD-10-CM | POA: Diagnosis not present

## 2014-06-11 DIAGNOSIS — G4733 Obstructive sleep apnea (adult) (pediatric): Secondary | ICD-10-CM | POA: Diagnosis present

## 2014-06-11 DIAGNOSIS — I129 Hypertensive chronic kidney disease with stage 1 through stage 4 chronic kidney disease, or unspecified chronic kidney disease: Secondary | ICD-10-CM | POA: Diagnosis present

## 2014-06-11 DIAGNOSIS — Z6841 Body Mass Index (BMI) 40.0 and over, adult: Secondary | ICD-10-CM

## 2014-06-11 DIAGNOSIS — R609 Edema, unspecified: Secondary | ICD-10-CM | POA: Diagnosis present

## 2014-06-11 DIAGNOSIS — I5033 Acute on chronic diastolic (congestive) heart failure: Secondary | ICD-10-CM | POA: Diagnosis present

## 2014-06-11 DIAGNOSIS — I509 Heart failure, unspecified: Secondary | ICD-10-CM | POA: Diagnosis not present

## 2014-06-11 DIAGNOSIS — Z85118 Personal history of other malignant neoplasm of bronchus and lung: Secondary | ICD-10-CM | POA: Diagnosis not present

## 2014-06-11 DIAGNOSIS — E662 Morbid (severe) obesity with alveolar hypoventilation: Secondary | ICD-10-CM | POA: Diagnosis present

## 2014-06-11 DIAGNOSIS — Z91048 Other nonmedicinal substance allergy status: Secondary | ICD-10-CM | POA: Diagnosis not present

## 2014-06-11 DIAGNOSIS — J9811 Atelectasis: Secondary | ICD-10-CM | POA: Diagnosis not present

## 2014-06-11 DIAGNOSIS — R262 Difficulty in walking, not elsewhere classified: Secondary | ICD-10-CM | POA: Diagnosis present

## 2014-06-11 DIAGNOSIS — I5032 Chronic diastolic (congestive) heart failure: Secondary | ICD-10-CM | POA: Diagnosis present

## 2014-06-11 DIAGNOSIS — I517 Cardiomegaly: Secondary | ICD-10-CM | POA: Diagnosis not present

## 2014-06-11 DIAGNOSIS — R7309 Other abnormal glucose: Secondary | ICD-10-CM | POA: Diagnosis present

## 2014-06-11 DIAGNOSIS — N179 Acute kidney failure, unspecified: Secondary | ICD-10-CM | POA: Diagnosis not present

## 2014-06-11 DIAGNOSIS — N183 Chronic kidney disease, stage 3 (moderate): Secondary | ICD-10-CM | POA: Diagnosis present

## 2014-06-11 DIAGNOSIS — I10 Essential (primary) hypertension: Secondary | ICD-10-CM

## 2014-06-11 DIAGNOSIS — J9611 Chronic respiratory failure with hypoxia: Secondary | ICD-10-CM | POA: Diagnosis present

## 2014-06-11 DIAGNOSIS — D869 Sarcoidosis, unspecified: Secondary | ICD-10-CM | POA: Diagnosis present

## 2014-06-11 DIAGNOSIS — R600 Localized edema: Secondary | ICD-10-CM | POA: Diagnosis present

## 2014-06-11 DIAGNOSIS — M1000 Idiopathic gout, unspecified site: Secondary | ICD-10-CM | POA: Diagnosis not present

## 2014-06-11 DIAGNOSIS — D509 Iron deficiency anemia, unspecified: Secondary | ICD-10-CM | POA: Diagnosis present

## 2014-06-11 DIAGNOSIS — N189 Chronic kidney disease, unspecified: Secondary | ICD-10-CM | POA: Diagnosis not present

## 2014-06-11 DIAGNOSIS — J9621 Acute and chronic respiratory failure with hypoxia: Secondary | ICD-10-CM | POA: Diagnosis not present

## 2014-06-11 DIAGNOSIS — M6281 Muscle weakness (generalized): Secondary | ICD-10-CM | POA: Diagnosis not present

## 2014-06-11 LAB — CBC WITH DIFFERENTIAL/PLATELET
BASOS ABS: 0 10*3/uL (ref 0.0–0.1)
BASOS PCT: 0 % (ref 0–1)
Eosinophils Absolute: 0 10*3/uL (ref 0.0–0.7)
Eosinophils Relative: 0 % (ref 0–5)
HEMATOCRIT: 25 % — AB (ref 36.0–46.0)
HEMOGLOBIN: 8.1 g/dL — AB (ref 12.0–15.0)
LYMPHS ABS: 0.5 10*3/uL — AB (ref 0.7–4.0)
Lymphocytes Relative: 5 % — ABNORMAL LOW (ref 12–46)
MCH: 21.7 pg — ABNORMAL LOW (ref 26.0–34.0)
MCHC: 32.4 g/dL (ref 30.0–36.0)
MCV: 67 fL — AB (ref 78.0–100.0)
Monocytes Absolute: 0.6 10*3/uL (ref 0.1–1.0)
Monocytes Relative: 6 % (ref 3–12)
NEUTROS ABS: 8.8 10*3/uL — AB (ref 1.7–7.7)
Neutrophils Relative %: 89 % — ABNORMAL HIGH (ref 43–77)
Platelets: 178 10*3/uL (ref 150–400)
RBC: 3.73 MIL/uL — ABNORMAL LOW (ref 3.87–5.11)
RDW: 14.8 % (ref 11.5–15.5)
WBC: 9.9 10*3/uL (ref 4.0–10.5)

## 2014-06-11 LAB — COMPREHENSIVE METABOLIC PANEL
ALBUMIN: 2.5 g/dL — AB (ref 3.5–5.2)
ALK PHOS: 298 U/L — AB (ref 39–117)
ALT: 28 U/L (ref 0–35)
AST: 32 U/L (ref 0–37)
Anion gap: 12 (ref 5–15)
BILIRUBIN TOTAL: 1 mg/dL (ref 0.3–1.2)
BUN: 27 mg/dL — AB (ref 6–23)
CHLORIDE: 98 meq/L (ref 96–112)
CO2: 25 mEq/L (ref 19–32)
Calcium: 9.2 mg/dL (ref 8.4–10.5)
Creatinine, Ser: 1.51 mg/dL — ABNORMAL HIGH (ref 0.50–1.10)
GFR calc Af Amer: 42 mL/min — ABNORMAL LOW (ref >90)
GFR calc non Af Amer: 36 mL/min — ABNORMAL LOW (ref >90)
Glucose, Bld: 125 mg/dL — ABNORMAL HIGH (ref 70–99)
Potassium: 4.5 mEq/L (ref 3.7–5.3)
Sodium: 135 mEq/L — ABNORMAL LOW (ref 137–147)
Total Protein: 6.6 g/dL (ref 6.0–8.3)

## 2014-06-11 LAB — PRO B NATRIURETIC PEPTIDE: PRO B NATRI PEPTIDE: 1193 pg/mL — AB (ref 0–125)

## 2014-06-11 MED ORDER — FUROSEMIDE 10 MG/ML IJ SOLN
40.0000 mg | Freq: Once | INTRAMUSCULAR | Status: AC
  Administered 2014-06-11: 40 mg via INTRAVENOUS
  Filled 2014-06-11: qty 4

## 2014-06-11 MED ORDER — OXYCODONE-ACETAMINOPHEN 5-325 MG PO TABS
2.0000 | ORAL_TABLET | Freq: Once | ORAL | Status: AC
Start: 2014-06-11 — End: 2014-06-11
  Administered 2014-06-11: 2 via ORAL
  Filled 2014-06-11: qty 2

## 2014-06-11 NOTE — ED Notes (Signed)
Patient transported to X-ray 

## 2014-06-11 NOTE — ED Provider Notes (Signed)
CSN: [id number]     Arrival date & time 06/11/14  1849 History   First MD Initiated Contact with Patient 06/11/14 1902     Chief Complaint  Patient presents with  . Edema   Pamela [name] is a 63 y.o. female with a history of gout, chronic kidney disease, COPD, sarcoidosis and high blood pressure who presents the emergency department complaining of bilateral leg swelling for the past 3 days. Patient was discharged from rehabilitation 3 days ago. She reports bilateral leg edema started after her discharge. She reports she is unable to walk now due to increasing edema. She reports she's had leg edema previously, but had minimal edema while in rehabilitation and was able to ambulate. She has previously been on Lasix, spironolactone and Hydrocort thiazide but this was discontinued during her gout episode. Patient denies fevers, chills, shortness of breath, chest pain, palpitations, abdominal pain, numbness, or tingling. Patient is on 2 L oxygen via nasal cannula at home.  (Consider location/radiation/quality/duration/timing/severity/associated sxs/prior Treatment) HPI  Past Medical History  Diagnosis Date  . CHF (congestive heart failure)   . Asthma   . COPD (chronic obstructive pulmonary disease)   . Arthritis   . Sarcoidosis   . Cancer     lung  . Hypertension   . Sleep apnea   . Oxygen deficiency    Past Surgical History  Procedure Laterality Date  . Lung biopsy    . Tubal ligation    . Vein ligation and stripping     Family History  Problem Relation Age of Onset  . Hypertension Mother   . Renal Disease Mother   . Cancer Father   . Heart disease Father   . Cancer Brother   . Diabetes Brother   . Cervical cancer Sister   . Diabetes Sister   . Multiple myeloma Sister    History  Substance Use Topics  . Smoking status: Never Smoker   . Smokeless tobacco: Never Used  . Alcohol Use: No   OB History    No data available     Review of Systems  Constitutional: Negative  for fever and chills.  HENT: Negative for congestion, ear pain, sneezing, sore throat and trouble swallowing.   Eyes: Negative for pain and visual disturbance.  Respiratory: Negative for cough, chest tightness, shortness of breath and wheezing.   Cardiovascular: Positive for leg swelling. Negative for chest pain and palpitations.  Gastrointestinal: Negative for nausea, vomiting, abdominal pain and diarrhea.  Genitourinary: Negative for dysuria, hematuria and difficulty urinating.  Musculoskeletal: Negative for back pain and neck pain.  Skin: Negative for rash.  Neurological: Negative for dizziness, speech difficulty, light-headedness, numbness and headaches.  All other systems reviewed and are negative.     Allergies  Sulfur  Home Medications   Prior to Admission medications   Medication Sig Start Date End Date Taking? Authorizing Provider  aspirin 81 MG tablet Take 81 mg by mouth daily.   Yes Historical Provider, MD  Calcium Carbonate-Vitamin D (CALTRATE 600+D PO) Take 1 tablet by mouth 1 day or 1 dose.   Yes Historical Provider, MD  docusate sodium (COLACE) 100 MG capsule Take 100 mg by mouth 2 (two) times daily.   Yes Historical Provider, MD  febuxostat (ULORIC) 40 MG tablet Take 40 mg by mouth daily.   Yes Historical Provider, MD  ferrous sulfate 325 (65 FE) MG tablet Take 1 tablet (325 mg total) by mouth 2 (two) times daily with a meal. 05/28/14  Yes Nishant  Dhungel, MD  mometasone-formoterol (DULERA) 100-5 MCG/ACT AERO Take 2 puffs first thing in am and then another 2 puffs about 12 hours later. 04/27/14  Yes Tanda Rockers, MD  OVER THE COUNTER MEDICATION Take 1,000 mg by mouth daily.   Yes Historical Provider, MD  oxycodone (OXY-IR) 5 MG capsule Take 1 capsule (5 mg total) by mouth every 4 (four) hours as needed for pain. 05/28/14  Yes Nishant Dhungel, MD  predniSONE (DELTASONE) 20 MG tablet Take 1 tablet (20 mg total) by mouth daily with breakfast. For 10 days for gout, then  return to 49m daily thereafter for sarcoid (until assessed by Dr. WMelvyn Novas 05/28/14  Yes TGayland Curry DO  Specialty Vitamins Products (MAGNESIUM, AMINO ACID CHELATE,) 133 MG tablet Take 1 tablet by mouth 2 (two) times daily.   Yes Historical Provider, MD   BP 121/53 mmHg  Pulse 71  Temp(Src) 98.6 F (37 C) (Oral)  Resp 18  Wt 354 lb 6 oz (160.743 kg)  SpO2 100% Physical Exam  Constitutional: She is oriented to person, place, and time. She appears well-developed and well-nourished. No distress.  Patient is an obese female.   HENT:  Head: Normocephalic and atraumatic.  Mouth/Throat: Oropharynx is clear and moist. No oropharyngeal exudate.  Eyes: Conjunctivae are normal. Pupils are equal, round, and reactive to light. Right eye exhibits no discharge. Left eye exhibits no discharge.  Neck: Neck supple.  Cardiovascular: Normal rate, regular rhythm, normal heart sounds and intact distal pulses.  Exam reveals no gallop and no friction rub.   No murmur heard. Pulmonary/Chest: Effort normal and breath sounds normal. No respiratory distress. She has no wheezes. She has no rales.  Abdominal: Soft. She exhibits no distension. There is no tenderness.  Musculoskeletal: She exhibits edema.  3+ pitting edema to her bilateral lower legs and feet. Bilateral posterior tibialis and dorsalis pedis pulses are auscultated by doppler.  Lymphadenopathy:    She has no cervical adenopathy.  Neurological: She is alert and oriented to person, place, and time. Coordination normal.  Skin: Skin is warm and dry. She is not diaphoretic. No pallor.  Venous stasis dermatitis noted to her bilateral lower legs.  Psychiatric: She has a normal mood and affect. Her behavior is normal.  Nursing note and vitals reviewed.   ED Course  Procedures (including critical care time) Labs Review Labs Reviewed  PRO B NATRIURETIC PEPTIDE - Abnormal; Notable for the following:    Pro B Natriuretic peptide (BNP) 1193.0 (*)    All  other components within normal limits  COMPREHENSIVE METABOLIC PANEL - Abnormal; Notable for the following:    Sodium 135 (*)    Glucose, Bld 125 (*)    BUN 27 (*)    Creatinine, Ser 1.51 (*)    Albumin 2.5 (*)    Alkaline Phosphatase 298 (*)    GFR calc non Af Amer 36 (*)    GFR calc Af Amer 42 (*)    All other components within normal limits  CBC WITH DIFFERENTIAL - Abnormal; Notable for the following:    RBC 3.73 (*)    Hemoglobin 8.1 (*)    HCT 25.0 (*)    MCV 67.0 (*)    MCH 21.7 (*)    Neutrophils Relative % 89 (*)    Lymphocytes Relative 5 (*)    Neutro Abs 8.8 (*)    Lymphs Abs 0.5 (*)    All other components within normal limits  CBC  BASIC METABOLIC PANEL  Imaging Review Dg Chest 2 View  06/11/2014   CLINICAL DATA:  Edema.  EXAM: CHEST  2 VIEW  COMPARISON:  None.  FINDINGS: Cardiomegaly and aortic tortuosity. There is a bandlike opacity in the right mid lung compatible with atelectasis. Increased density over the spine in the lateral projection is favored to represent overlapping soft tissues, no definitive consolidation in the frontal projection. There is mild venous congestion without pulmonary edema. No effusion or pneumothorax.  IMPRESSION: 1. Cardiomegaly and venous congestion. 2. Right perihilar atelectasis. Recommend follow-up after convalescence to document re- inflation.   Electronically Signed   By: Jorje Guild M.D.   On: 06/11/2014 23:09     EKG Interpretation None      Filed Vitals:   06/11/14 2207 06/11/14 2339 06/11/14 2352 06/12/14 0115  BP: 129/58 110/47  121/53  Pulse: 87 78  71  Temp:    98.6 F (37 C)  TempSrc:    Oral  Resp: _0 Weight:   354 lb 6 oz (160.743 kg)   SpO2: 100% 100%  100%     MDM   Meds given in ED:  Medications  predniSONE (DELTASONE) tablet 20 mg (not administered)  predniSONE (DELTASONE) tablet 10 mg (not administered)  mometasone-formoterol (DULERA) 100-5 MCG/ACT inhaler 2 puff (2 puffs Inhalation  Not Given 06/12/14 0146)  aspirin chewable tablet 81 mg (not administered)  heparin injection 5,000 Units (not administered)  sodium chloride 0.9 % injection 3 mL (not administered)  sodium chloride 0.9 % injection 3 mL (not administered)  0.9 %  sodium chloride infusion (not administered)  ondansetron (ZOFRAN) tablet 4 mg (not administered)    Or  ondansetron (ZOFRAN) injection 4 mg (not administered)  furosemide (LASIX) injection 20 mg (not administered)  spironolactone (ALDACTONE) tablet 25 mg (not administered)  oxyCODONE-acetaminophen (PERCOCET/ROXICET) 5-325 MG per tablet 2 tablet (2 tablets Oral Given 06/11/14 2206)  furosemide (LASIX) injection 40 mg (40 mg Intravenous Given 06/11/14 2355)    Current Discharge Medication List      Final diagnoses:  Bilateral leg edema  Chronic congestive heart failure, unspecified congestive heart failure type   Pamela [name] is a 63 y.o. female with a history of gout, chronic kidney disease, COPD, sarcoidosis and high blood pressure who presents the emergency department complaining of bilateral leg swelling for the past 3 days. The patient is usually able to ambulate at home but has not since increased edema and pain. The patient is not currently on diuretics. The patient is unable to ambulate while in the ED. The patient's bilateral posterior tibialis and dorsalis pedis pulses are auscultated by Doppler. Patient's BNP is 1193. The patient's GFR is 42 which is slightly better than her last visit. There is some evidence of venous congestion on her chest x-ray.  The patient and her son are strongly requesting admission. I agree with this plan I think she could benefit from starting on a regimen for diuretics as well as working to improve her pain and ambulation. After discussing with Dr. Doy Mince agrees. Dr. Doy Mince will perform consult for admission at this time.   Patient was discussed with and evaluated by Dr. Doy Mince who agrees with assessment  and plan.     Hanley Hays, PA-C 06/12/14 9357  Artis Delay, MD 06/12/14 2259

## 2014-06-11 NOTE — H&P (Signed)
[facility] History and Physical  Pamela [name] XBL:[id number] DOB: 1950/11/27 DOA: 06/11/2014  Referring physician: Verda Cumins PA - [place].  PCP: MATTHEWS,MICHELLE A., MD   Chief Complaint: LE edema  HPI: Pamela [name] is a 63 y.o. female  Progressive LE edema since discharge from rehab facility 3 days ago. Pt apparently admitted on 05/24/14 for gout flare and was taken off of iduretic medications. LE so edematous that pt having a hard time ambulating.    Review of Systems:  Constitutional:  No weight loss, night sweats, Fevers, chills, fatigue.  HEENT:  No headaches, Difficulty swallowing,Tooth/dental problems,Sore throat,  No sneezing, itching, ear ache, nasal congestion, post nasal drip,  Cardio-vascular:  No chest pain, anasarca, dizziness, palpitations  GI:  No heartburn, indigestion, abdominal pain, nausea, vomiting, diarrhea, change in bowel habits, loss of appetite  Resp:  No SOB, cough, congestion Skin:  no rash or lesions.  GU:  no dysuria, change in color of urine, no urgency or frequency. No flank pain.  Musculoskeletal:  No joint pain or swelling. No decreased range of motion. No back pain.  Psych:  No change in mood or affect. No depression or anxiety. No memory loss.   Past Medical History  Diagnosis Date  . CHF (congestive heart failure)   . Asthma   . COPD (chronic obstructive pulmonary disease)   . Arthritis   . Sarcoidosis   . Cancer     lung  . Hypertension   . Sleep apnea   . Oxygen deficiency    Past Surgical History  Procedure Laterality Date  . Lung biopsy    . Tubal ligation    . Vein ligation and stripping     Social History:  reports that she has never smoked. She has never used smokeless tobacco. She reports that she does not drink alcohol or use illicit drugs.  Allergies  Allergen Reactions  . Sulfur Rash    Family History  Problem Relation Age of Onset  . Hypertension Mother   . Renal Disease Mother   . Cancer  Father   . Heart disease Father   . Cancer Brother   . Diabetes Brother   . Cervical cancer Sister   . Diabetes Sister   . Multiple myeloma Sister      Prior to Admission medications   Medication Sig Start Date End Date Taking? Authorizing Provider  aspirin 81 MG tablet Take 81 mg by mouth daily.   Yes Historical Provider, MD  Calcium Carbonate-Vitamin D (CALTRATE 600+D PO) Take 1 tablet by mouth 1 day or 1 dose.   Yes Historical Provider, MD  docusate sodium (COLACE) 100 MG capsule Take 100 mg by mouth 2 (two) times daily.   Yes Historical Provider, MD  febuxostat (ULORIC) 40 MG tablet Take 40 mg by mouth daily.   Yes Historical Provider, MD  ferrous sulfate 325 (65 FE) MG tablet Take 1 tablet (325 mg total) by mouth 2 (two) times daily with a meal. 05/28/14  Yes Nishant Dhungel, MD  mometasone-formoterol (DULERA) 100-5 MCG/ACT AERO Take 2 puffs first thing in am and then another 2 puffs about 12 hours later. 04/27/14  Yes Tanda Rockers, MD  OVER THE COUNTER MEDICATION Take 1,000 mg by mouth daily.   Yes Historical Provider, MD  oxycodone (OXY-IR) 5 MG capsule Take 1 capsule (5 mg total) by mouth every 4 (four) hours as needed for pain. 05/28/14  Yes Nishant Dhungel, MD  predniSONE (DELTASONE) 20 MG tablet Take 1 tablet (  20 mg total) by mouth daily with breakfast. For 10 days for gout, then return to 36m daily thereafter for sarcoid (until assessed by Dr. WMelvyn Novas 05/28/14  Yes TGayland Curry DO  Specialty Vitamins Products (MAGNESIUM, AMINO ACID CHELATE,) 133 MG tablet Take 1 tablet by mouth 2 (two) times daily.   Yes Historical Provider, MD   Physical Exam: Filed Vitals:   06/11/14 2046 06/11/14 2207 06/11/14 2339 06/11/14 2352  BP: 138/67 129/58 110/47   Pulse: 74 87 78   Temp:      TempSrc:      Resp: _0 Weight:    160.743 kg (354 lb 6 oz)  SpO2: 100% 100% 100%     Wt Readings from Last 3 Encounters:  06/11/14 160.743 kg (354 lb 6 oz)  06/05/14 161.481 kg (356 lb)    05/28/14 162.841 kg (359 lb)    General:  Appears calm and comfortable Eyes:  PERRL, normal lids, irises & conjunctiva ENT:  grossly normal hearing, lips & tongue Neck:  no LAD, masses or thyromegaly Cardiovascular:  RRR, no m/r/g. No LE edema. Telemetry:  SR, no arrhythmias  Respiratory:  CTA bilaterally, no w/r/r. Normal respiratory effort. Abdomen:  soft, ntnd Skin:  no rash or induration seen on limited exam Musculoskeletal:  grossly normal tone BUE/BLE Psychiatric:  grossly normal mood and affect, speech fluent and appropriate Neurologic:  grossly non-focal.          Labs on Admission:  Basic Metabolic Panel:  Recent Labs Lab 06/11/14 1929  NA 135*  K 4.5  CL 98  CO2 25  GLUCOSE 125*  BUN 27*  CREATININE 1.51*  CALCIUM 9.2   Liver Function Tests:  Recent Labs Lab 06/11/14 1929  AST 32  ALT 28  ALKPHOS 298*  BILITOT 1.0  PROT 6.6  ALBUMIN 2.5*   No results for input(s): LIPASE, AMYLASE in the last 168 hours. No results for input(s): AMMONIA in the last 168 hours. CBC:  Recent Labs Lab 06/11/14 1929  WBC 9.9  NEUTROABS 8.8*  HGB 8.1*  HCT 25.0*  MCV 67.0*  PLT 178   Cardiac Enzymes: No results for input(s): CKTOTAL, CKMB, CKMBINDEX, TROPONINI in the last 168 hours.  BNP (last 3 results)  Recent Labs  04/13/14 1234 05/24/14 0700 06/11/14 1929  PROBNP 1144.00* 1235.0* 1193.0*   CBG: No results for input(s): GLUCAP in the last 168 hours.  Radiological Exams on Admission: Dg Chest 2 View  06/11/2014   CLINICAL DATA:  Edema.  EXAM: CHEST  2 VIEW  COMPARISON:  None.  FINDINGS: Cardiomegaly and aortic tortuosity. There is a bandlike opacity in the right mid lung compatible with atelectasis. Increased density over the spine in the lateral projection is favored to represent overlapping soft tissues, no definitive consolidation in the frontal projection. There is mild venous congestion without pulmonary edema. No effusion or pneumothorax.   IMPRESSION: 1. Cardiomegaly and venous congestion. 2. Right perihilar atelectasis. Recommend follow-up after convalescence to document re- inflation.   Electronically Signed   By: JJorje GuildM.D.   On: 06/11/2014 23:09    EKG: Independently reviewed. Not performed in ED  Assessment/Plan Principal Problem:   Chronic diastolic CHF (congestive heart failure) Active Problems:   Essential hypertension   Lower extremity edema   Morbid obesity   Sarcoidosis   Obstructive sleep apnea   Diastolic CHF: EF 658-52%w/ Grade 1 diastolic dysfunction as of 05/27/14. BNP on admission 1193. Wt and BNP actually  improved from last admission - 354lbs today. 359lbs at time of discharge on 05/28/14. Pt stopped on diuretics at time of last admission. 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 Pt feeling very insecure about going home given feelings of edema.  - Admit for obs - Lasix 80m IV and Spironolactone 25 - At discharge restart lasix and spironolactone (previous home lasix 422mPO BID and Spironolactone 2524mday) - Strict I/O - daily wts. - compression hose - BMET in am  Acute Gout: no further pain since previous admission. Still on Steroid taper - continue steroid taper.  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 Baseline ~ 1.8.  - anticipate elevation with diuresis  HTN: normotensive on admission - monitor closely on diuresis.    OSA: likely secondary to obesity hypoventilation syndrome - CPAP at night - nutrition counseling  Sarcoidosis: on baseline 2L [place] - continue home prednisone after finishing burst for gout. - continue home dulera  Code Status: FULL DVT Prophylaxis: Hep Family Communication: none  Disposition Plan: OBS  Time spent: >70 min  [email or web address] [email or web address] Password [email or web address]

## 2014-06-11 NOTE — Progress Notes (Signed)
CSW met with Pt in room. There was no family at bedside during the interview. The Pt says she came to the ED from home, but was recently discharged  From [facility] this past Saturday, June 09, 2014. The pt states she was doing well once she left. States she was able to wash herself and do ADL's independently but Sunday she woke up in pain and unable to ambulate. The pt says she has swelling in her legs and knees. The pt also states that her hands feel more swollen than usual, but says some of the swelling is from gout.   The pt also informed CSW that she has had congestive heart failure for over 10 years. The Pt stated her main concern was that she has a lot of extra fluid, that is causing swelling. Pt stated the doctor has ordered blood work for labs.   Son Loma Sousa) [phone] Daughter Hal Hope) [phone]   Willette Brace [phone] ED CSW 06/11/2014 8:51 PM

## 2014-06-11 NOTE — ED Notes (Signed)
Bed: WA25 Expected date:  Expected time:  Means of arrival:  Comments: ems  

## 2014-06-11 NOTE — ED Notes (Addendum)
Per EMS: pt c/o bilateral swelling to legs. Pt was recently seen here on the 4th d/c and went to [facility] for rehab. D/c from rehab on Saturday. Before today pt was able to ambulate with no problem, starting Sunday she noticed swelling in lower extremities, today is when pt started being able to not ambulate. Pt has pitting edema to legs. Pt denies SOB.

## 2014-06-12 DIAGNOSIS — N179 Acute kidney failure, unspecified: Secondary | ICD-10-CM

## 2014-06-12 DIAGNOSIS — Z7952 Long term (current) use of systemic steroids: Secondary | ICD-10-CM | POA: Diagnosis not present

## 2014-06-12 DIAGNOSIS — D509 Iron deficiency anemia, unspecified: Secondary | ICD-10-CM | POA: Diagnosis not present

## 2014-06-12 DIAGNOSIS — M1A9XX0 Chronic gout, unspecified, without tophus (tophi): Secondary | ICD-10-CM | POA: Diagnosis present

## 2014-06-12 DIAGNOSIS — D869 Sarcoidosis, unspecified: Secondary | ICD-10-CM | POA: Diagnosis present

## 2014-06-12 DIAGNOSIS — N189 Chronic kidney disease, unspecified: Secondary | ICD-10-CM | POA: Diagnosis not present

## 2014-06-12 DIAGNOSIS — I5033 Acute on chronic diastolic (congestive) heart failure: Secondary | ICD-10-CM | POA: Diagnosis not present

## 2014-06-12 DIAGNOSIS — I5020 Unspecified systolic (congestive) heart failure: Secondary | ICD-10-CM | POA: Diagnosis not present

## 2014-06-12 DIAGNOSIS — Z6841 Body Mass Index (BMI) 40.0 and over, adult: Secondary | ICD-10-CM | POA: Diagnosis not present

## 2014-06-12 DIAGNOSIS — M1000 Idiopathic gout, unspecified site: Secondary | ICD-10-CM | POA: Diagnosis not present

## 2014-06-12 DIAGNOSIS — R600 Localized edema: Secondary | ICD-10-CM | POA: Diagnosis not present

## 2014-06-12 DIAGNOSIS — I5030 Unspecified diastolic (congestive) heart failure: Secondary | ICD-10-CM | POA: Diagnosis not present

## 2014-06-12 DIAGNOSIS — M1990 Unspecified osteoarthritis, unspecified site: Secondary | ICD-10-CM | POA: Diagnosis present

## 2014-06-12 DIAGNOSIS — I5032 Chronic diastolic (congestive) heart failure: Secondary | ICD-10-CM | POA: Diagnosis not present

## 2014-06-12 DIAGNOSIS — N183 Chronic kidney disease, stage 3 (moderate): Secondary | ICD-10-CM | POA: Diagnosis present

## 2014-06-12 DIAGNOSIS — Z7982 Long term (current) use of aspirin: Secondary | ICD-10-CM | POA: Diagnosis not present

## 2014-06-12 DIAGNOSIS — J9611 Chronic respiratory failure with hypoxia: Secondary | ICD-10-CM | POA: Diagnosis present

## 2014-06-12 DIAGNOSIS — J9621 Acute and chronic respiratory failure with hypoxia: Secondary | ICD-10-CM | POA: Diagnosis not present

## 2014-06-12 DIAGNOSIS — I129 Hypertensive chronic kidney disease with stage 1 through stage 4 chronic kidney disease, or unspecified chronic kidney disease: Secondary | ICD-10-CM | POA: Diagnosis not present

## 2014-06-12 DIAGNOSIS — E662 Morbid (severe) obesity with alveolar hypoventilation: Secondary | ICD-10-CM | POA: Diagnosis present

## 2014-06-12 DIAGNOSIS — R609 Edema, unspecified: Secondary | ICD-10-CM | POA: Diagnosis not present

## 2014-06-12 DIAGNOSIS — R262 Difficulty in walking, not elsewhere classified: Secondary | ICD-10-CM | POA: Diagnosis present

## 2014-06-12 DIAGNOSIS — Z91048 Other nonmedicinal substance allergy status: Secondary | ICD-10-CM | POA: Diagnosis not present

## 2014-06-12 DIAGNOSIS — J45909 Unspecified asthma, uncomplicated: Secondary | ICD-10-CM | POA: Diagnosis present

## 2014-06-12 DIAGNOSIS — G4733 Obstructive sleep apnea (adult) (pediatric): Secondary | ICD-10-CM | POA: Diagnosis present

## 2014-06-12 DIAGNOSIS — J449 Chronic obstructive pulmonary disease, unspecified: Secondary | ICD-10-CM | POA: Diagnosis not present

## 2014-06-12 DIAGNOSIS — R7309 Other abnormal glucose: Secondary | ICD-10-CM | POA: Diagnosis present

## 2014-06-12 DIAGNOSIS — M6281 Muscle weakness (generalized): Secondary | ICD-10-CM | POA: Diagnosis not present

## 2014-06-12 DIAGNOSIS — Z9981 Dependence on supplemental oxygen: Secondary | ICD-10-CM | POA: Diagnosis not present

## 2014-06-12 DIAGNOSIS — Z85118 Personal history of other malignant neoplasm of bronchus and lung: Secondary | ICD-10-CM | POA: Diagnosis not present

## 2014-06-12 DIAGNOSIS — I872 Venous insufficiency (chronic) (peripheral): Secondary | ICD-10-CM | POA: Diagnosis present

## 2014-06-12 DIAGNOSIS — C3490 Malignant neoplasm of unspecified part of unspecified bronchus or lung: Secondary | ICD-10-CM | POA: Diagnosis not present

## 2014-06-12 DIAGNOSIS — Z923 Personal history of irradiation: Secondary | ICD-10-CM | POA: Diagnosis not present

## 2014-06-12 LAB — CBC
HCT: 25.5 % — ABNORMAL LOW (ref 36.0–46.0)
Hemoglobin: 8.1 g/dL — ABNORMAL LOW (ref 12.0–15.0)
MCH: 21.7 pg — ABNORMAL LOW (ref 26.0–34.0)
MCHC: 31.8 g/dL (ref 30.0–36.0)
MCV: 68.4 fL — ABNORMAL LOW (ref 78.0–100.0)
Platelets: 169 K/uL (ref 150–400)
RBC: 3.73 MIL/uL — ABNORMAL LOW (ref 3.87–5.11)
RDW: 14.8 % (ref 11.5–15.5)
WBC: 7.9 K/uL (ref 4.0–10.5)

## 2014-06-12 LAB — BASIC METABOLIC PANEL
Anion gap: 12 (ref 5–15)
BUN: 27 mg/dL — AB (ref 6–23)
CHLORIDE: 101 meq/L (ref 96–112)
CO2: 27 mEq/L (ref 19–32)
Calcium: 9.2 mg/dL (ref 8.4–10.5)
Creatinine, Ser: 1.57 mg/dL — ABNORMAL HIGH (ref 0.50–1.10)
GFR calc non Af Amer: 35 mL/min — ABNORMAL LOW (ref >90)
GFR, EST AFRICAN AMERICAN: 40 mL/min — AB (ref >90)
Glucose, Bld: 103 mg/dL — ABNORMAL HIGH (ref 70–99)
Potassium: 4.2 mEq/L (ref 3.7–5.3)
Sodium: 140 mEq/L (ref 137–147)

## 2014-06-12 MED ORDER — PREDNISONE 20 MG PO TABS
20.0000 mg | ORAL_TABLET | Freq: Every day | ORAL | Status: DC
  Administered 2014-06-12 – 2014-06-15 (×4): 20 mg via ORAL
  Filled 2014-06-12 (×5): qty 1

## 2014-06-12 MED ORDER — SPIRONOLACTONE 25 MG PO TABS
25.0000 mg | ORAL_TABLET | Freq: Every day | ORAL | Status: DC
  Administered 2014-06-12 – 2014-06-15 (×4): 25 mg via ORAL
  Filled 2014-06-12 (×4): qty 1

## 2014-06-12 MED ORDER — SODIUM CHLORIDE 0.9 % IJ SOLN: 3.0000 mL | INTRAMUSCULAR | Status: DC | PRN

## 2014-06-12 MED ORDER — PREDNISONE 10 MG PO TABS: 10.0000 mg | ORAL_TABLET | Freq: Every day | ORAL | Status: DC

## 2014-06-12 MED ORDER — SODIUM CHLORIDE 0.9 % IV SOLN: 250.0000 mL | INTRAVENOUS | Status: DC | PRN

## 2014-06-12 MED ORDER — SODIUM CHLORIDE 0.9 % IJ SOLN
3.0000 mL | Freq: Two times a day (BID) | INTRAMUSCULAR | Status: DC
  Administered 2014-06-12 – 2014-06-15 (×8): 3 mL via INTRAVENOUS

## 2014-06-12 MED ORDER — HEPARIN SODIUM (PORCINE) 5000 UNIT/ML IJ SOLN
5000.0000 [IU] | Freq: Three times a day (TID) | INTRAMUSCULAR | Status: DC
  Administered 2014-06-12 – 2014-06-15 (×10): 5000 [IU] via SUBCUTANEOUS
  Filled 2014-06-12 (×14): qty 1

## 2014-06-12 MED ORDER — OXYCODONE-ACETAMINOPHEN 5-325 MG PO TABS
2.0000 | ORAL_TABLET | Freq: Four times a day (QID) | ORAL | Status: DC | PRN
  Administered 2014-06-12 – 2014-06-15 (×5): 2 via ORAL
  Filled 2014-06-12 (×5): qty 2

## 2014-06-12 MED ORDER — ONDANSETRON HCL 4 MG PO TABS: 4.0000 mg | ORAL_TABLET | Freq: Four times a day (QID) | ORAL | Status: DC | PRN

## 2014-06-12 MED ORDER — FUROSEMIDE 10 MG/ML IJ SOLN
20.0000 mg | Freq: Two times a day (BID) | INTRAMUSCULAR | Status: DC
  Administered 2014-06-12 – 2014-06-15 (×7): 20 mg via INTRAVENOUS
  Filled 2014-06-12 (×9): qty 2

## 2014-06-12 MED ORDER — ONDANSETRON HCL 4 MG/2ML IJ SOLN: 4.0000 mg | Freq: Four times a day (QID) | INTRAMUSCULAR | Status: DC | PRN

## 2014-06-12 MED ORDER — MOMETASONE FURO-FORMOTEROL FUM 100-5 MCG/ACT IN AERO
2.0000 | INHALATION_SPRAY | Freq: Two times a day (BID) | RESPIRATORY_TRACT | Status: DC
  Administered 2014-06-12 – 2014-06-14 (×6): 2 via RESPIRATORY_TRACT
  Filled 2014-06-12 (×2): qty 8.8

## 2014-06-12 MED ORDER — ASPIRIN 81 MG PO CHEW
81.0000 mg | CHEWABLE_TABLET | Freq: Every day | ORAL | Status: DC
  Administered 2014-06-12 – 2014-06-15 (×4): 81 mg via ORAL
  Filled 2014-06-12 (×5): qty 1

## 2014-06-12 NOTE — Progress Notes (Signed)
UR completed 

## 2014-06-12 NOTE — Progress Notes (Signed)
Pt arrived to floor via stretcher. VSS on arrival no SOB noted. Bilateral lower extremities edematous +2. Pt voiced no c/o pain currently resting well in no acute distress will continue to monitor.

## 2014-06-12 NOTE — Progress Notes (Signed)
Clinical Social Work Department BRIEF PSYCHOSOCIAL ASSESSMENT 06/12/2014  Patient:  Pamela [name],Pamela [name]     Account Number:  [id number]     Admit date:  06/11/2014  Clinical Social Worker:  Earlie Server  Date/Time:  06/12/2014 03:45 PM  Referred by:  Care Management  Date Referred:  06/12/2014 Referred for  SNF Placement   Other Referral:   Interview type:  Patient Other interview type:    PSYCHOSOCIAL DATA Living Status:  FAMILY Admitted from facility:   Level of care:   Primary support name:  [place] Primary support relationship to patient:  CHILD, ADULT Degree of support available:   Strong    CURRENT CONCERNS Current Concerns  Post-Acute Placement   Other Concerns:    SOCIAL WORK ASSESSMENT / PLAN CSW received referral in order to assist with DC planning. CSW reviewed chart and met with patient at bedside.    Patient reports she was recently DC to [facility] and went home with family but patient reports she declined quickly. Patient reports that she does not want to be a burden to her family and wants to go back to [facility] for further therapy. CSW explained that CSW would check with SNF and if unable to accept then CSW could assist with [facility] search.    CSW completed FL2 and faxed information to SNF to review. CSW will follow up with bed offers.   Assessment/plan status:  Psychosocial Support/Ongoing Assessment of Needs Other assessment/ plan:   Information/referral to community resources:   SNF information    PATIENT'S/FAMILY'S RESPONSE TO PLAN OF CARE: Patient alert and oriented. Patient engaged in assessment and reports she remembered CSW from previous admission. Patient reports SNF treated her well and did a good job with rehab and hopes she can return. Patient aware of plans and agreeable for CSW to continue to [place], LCSW [phone]

## 2014-06-12 NOTE — ED Provider Notes (Signed)
Medical screening examination/treatment/procedure(s) were conducted as a shared visit with non-physician practitioner(s) and myself.  I personally evaluated the patient during the encounter.   EKG Interpretation None      63 year old female with history of CHF who presents with bilateral lower extremity edema. She has recently been hospitalized for gout and acute renal insufficiency, and has had her diuretics significantly adjusted.  On exam, well appearing, nontoxic, not distressed, normal respiratory effort, normal perfusion, lungs CTAB, BLE show pitting edema.  She has significant comorbidities and evidence of volume overload.  Admit to Medicine.   Clinical Impression: 1. Bilateral leg edema   2. Chronic congestive heart failure, unspecified congestive heart failure type       Artis Delay, MD 06/12/14 2258

## 2014-06-12 NOTE — Progress Notes (Signed)
CARE MANAGEMENT NOTE 06/12/2014  Patient:  Pamela [name],Pamela [name]   Account Number:  [id number]  Date Initiated:  06/12/2014  Documentation initiated by:  Edwyna Shell  Subjective/Objective Assessment:   63 yo female admitted with acute on chronic CHF     Action/Plan:   discharge planning   Anticipated DC Date:  06/15/2014   Anticipated DC Plan:  [place]  In-house referral  Clinical Social Worker      DC Planning Services  CM consult      Choice offered to / List presented to:             Status of service:  In process, will continue to follow Medicare Important Message given?   (If response is "NO", the following Medicare IM given date fields will be blank) Date Medicare IM given:   Medicare IM given by:   Date Additional Medicare IM given:   Additional Medicare IM given by:    Discharge Disposition:    Per UR Regulation:    If discussed at Long Length of Stay Meetings, dates discussed:    Comments:  06/12/14 Edwyna Shell RN, BSN CM [phone] Patient stated that she was discharged home from [facility] 3 days ago to home, where she lives with her son and his family. She stated that they are a support but are often busy. She was followed in home by [facility] OT, PT and has a walker, cane, BSC, and shower chair. Case management will continue to follow

## 2014-06-12 NOTE — Progress Notes (Addendum)
Spoke with pt regarding cpap.  Pt stated she took several naps today and probably won't be ready for cpap tonight until after 2am.  Pt was encouraged to call for RT if she is ready for cpap before then.  RN aware.

## 2014-06-12 NOTE — Progress Notes (Addendum)
[facility] PROGRESS NOTE  Pamela [name] KGY:[id number] DOB: May 20, 1951 DOA: 06/11/2014 PCP: MATTHEWS,MICHELLE A., MD   Brief narrative 63 year old morbidly obese female with history of asthma,, sarcoidosis(on chronic low-dose prednisone), hx of CHF, right upper lobe lung cancer who recently relocated from [place] ( was following with a pulmonologist and oncologist there), OSA on CPAP,history of heart failure, admitted recently for acute polyarticular gouty arthritis and AKI on CKD. She was discharged to SNF. Her lasix and aldactone were held due to AKI upon discharge with recommendations to resume once renal fn were stable as outpt. patient got discharged home from SNF few days back but was still off lasix. She now presented to the  ED with increased b/l leg swelling with difficulty ambulating.  Assessment/Plan: Acute on chronic diastolic CHF EF 02-63% w/ Grade 1 diastolic dysfunction as of 05/27/14. BNP on admission 1193. CXR shows venous congestion. Placed on IV lasix on admission. Monitor I/O and daily weight. Hold HCTZ. Resumed aldactone.  Monitor lytes  chronic gout, recent flare On prednisone taper , resume to chronic home dose in 5 days ( 10 mg daily for chronic  sarcoidosis)  Pain control with prn percocet  Chronic kidney disease stage III Baseline creatinine of 1.3, worsened in am to 1.757, possibly due to diuresis. Monitor .  OSA on CPAP On CPAP at night. Has a pressure setting of 15 .   History of sarcoidosis On chronic low-dose prednisone 10 mg daily. Follow up with pulmonary as outpt  Hypertension Blood pressure stable. Hold  HCTZ  Monitor on lasix and aldactone   Morbid obesity  with deconditioning  PT eval requested. recently discharged from SNF post hospitalization  Hx of right lung mass Status post radiation with CT chest results done 6 months back showing unchanged result. Follow as outpt  Anemia, iron def Continue  iron  supplement.  Prediabetes A1c of 6.4. Monitor on SSI  History of Asthma Stable . Continue dulera. Follows with Dr Melvyn Novas  Diet: cardiac  DVT prophylaxis: sq heparin  Code Status: FULL CODE Family Communication: none at bedside  Disposition Plan: possibly home in 1-2 days   Consultants:  none  Procedures:  none  Antibiotics:  none  HPI/Subjective: Seen and examined. Admission H&P reviewed. C/o joint pains which is chronic .   Objective: Filed Vitals:   06/12/14 0530  BP: 143/65  Pulse: 76  Temp: 98.3 F (36.8 C)  Resp: 18    Intake/Output Summary (Last 24 hours) at 06/12/14 1404 Last data filed at 06/12/14 1015  Gross per 24 hour  Intake      0 ml  Output   1850 ml  Net  -1850 ml   Filed Weights   06/11/14 2352 06/12/14 0500  Weight: 160.743 kg (354 lb 6 oz) 160.619 kg (354 lb 1.6 oz)    Exam:   General: Elderly morbidly obese female no acute distress  HEENT: Moist oral mucosa  Chest: Clear to auscultation bilaterally  CVS: Normal S1 and S2,, diminished sounds due to body habitus  Abdomen: Soft, nondistended, nontender  Extremities: Deformed  hand joints with minimal tenderness over right and metacarpals , pitting edema over b/l leg,  CNS: alert and oriented.  Data Reviewed: Basic Metabolic Panel:  Recent Labs Lab 06/11/14 1929 06/12/14 0411  NA 135* 140  K 4.5 4.2  CL 98 101  CO2 25 27  GLUCOSE 125* 103*  BUN 27* 27*  CREATININE 1.51* 1.57*  CALCIUM 9.2 9.2   Liver Function Tests:  Recent Labs Lab 06/11/14 1929  AST 32  ALT 28  ALKPHOS 298*  BILITOT 1.0  PROT 6.6  ALBUMIN 2.5*   No results for input(s): LIPASE, AMYLASE in the last 168 hours. No results for input(s): AMMONIA in the last 168 hours. CBC:  Recent Labs Lab 06/11/14 1929 06/12/14 0411  WBC 9.9 7.9  NEUTROABS 8.8*  --   HGB 8.1* 8.1*  HCT 25.0* 25.5*  MCV 67.0* 68.4*  PLT 178 169   Cardiac Enzymes: No results for input(s): CKTOTAL, CKMB,  CKMBINDEX, TROPONINI in the last 168 hours. BNP (last 3 results)  Recent Labs  04/13/14 1234 05/24/14 0700 06/11/14 1929  PROBNP 1144.00* 1235.0* 1193.0*   CBG: No results for input(s): GLUCAP in the last 168 hours.  No results found for this or any previous visit (from the past 240 hour(s)).   Studies: Dg Chest 2 View  06/11/2014   CLINICAL DATA:  Edema.  EXAM: CHEST  2 VIEW  COMPARISON:  None.  FINDINGS: Cardiomegaly and aortic tortuosity. There is a bandlike opacity in the right mid lung compatible with atelectasis. Increased density over the spine in the lateral projection is favored to represent overlapping soft tissues, no definitive consolidation in the frontal projection. There is mild venous congestion without pulmonary edema. No effusion or pneumothorax.  IMPRESSION: 1. Cardiomegaly and venous congestion. 2. Right perihilar atelectasis. Recommend follow-up after convalescence to document re- inflation.   Electronically Signed   By: Jorje Guild M.D.   On: 06/11/2014 23:09    Scheduled Meds: . aspirin  81 mg Oral Daily  . furosemide  20 mg Intravenous BID  . heparin  5,000 Units Subcutaneous 3 times per day  . mometasone-formoterol  2 puff Inhalation BID  . [START ON 06/18/2014] predniSONE  10 mg Oral Q breakfast  . predniSONE  20 mg Oral Q breakfast  . sodium chloride  3 mL Intravenous Q12H  . spironolactone  25 mg Oral Daily   Continuous Infusions:     Time spent: 25 minutes    Bianco Cange, [place]  [facility] Pager [phone]. If 7PM-7AM, please contact night-coverage at [email or web address], password [email or web address] 06/12/2014, 2:04 PM  LOS: 1 day

## 2014-06-12 NOTE — Progress Notes (Signed)
New rx for cpap noted.  Spoke with pt regarding cpap use.  Pt stated she wears it sometimes at home.  Pt currently up watching tv at this time, and asked that I come back around 3am.  RN aware.

## 2014-06-12 NOTE — Progress Notes (Signed)
Clinical Social Work Department CLINICAL SOCIAL WORK PLACEMENT NOTE 06/12/2014  Patient:  Pamela [name],Pamela [name]  Account Number:  [id number] [place] date:  06/11/2014  Clinical Social Worker:  Sindy Messing, LCSW  Date/time:  06/12/2014 03:45 PM  Clinical Social Work is seeking post-discharge placement for this patient at the following level of care:   [place]   (*CSW will update this form in Epic as items are completed)   06/12/2014  Patient/family provided with [place] Department of Clinical Social Work's list of facilities offering this level of care within the geographic area requested by the patient (or if unable, by the patient's family).  06/12/2014  Patient/family informed of their freedom to choose among providers that offer the needed level of care, that participate in Medicare, Medicaid or managed care program needed by the patient, have an available bed and are willing to accept the patient.  06/12/2014  Patient/family informed of [facility]' ownership interest in [facility], as well as of the fact that they are under no obligation to receive care at this facility.  PASARR submitted to EDS on existing # PASARR number received on   FL2 transmitted to all facilities in geographic area requested by pt/family on  06/12/2014 FL2 transmitted to all facilities within larger geographic area on   Patient informed that his/her managed care company has contracts with or will negotiate with  certain facilities, including the following:     Patient/family informed of bed offers received:   Patient chooses bed at  Physician recommends and patient chooses bed at    Patient to be transferred to  on   Patient to be transferred to facility by  Patient and family notified of transfer on  Name of family member notified:    The following physician request were entered in Epic:   Additional Comments:

## 2014-06-13 LAB — BASIC METABOLIC PANEL
Anion gap: 13 (ref 5–15)
BUN: 28 mg/dL — AB (ref 6–23)
CO2: 29 mEq/L (ref 19–32)
CREATININE: 1.54 mg/dL — AB (ref 0.50–1.10)
Calcium: 8.8 mg/dL (ref 8.4–10.5)
Chloride: 96 mEq/L (ref 96–112)
GFR calc non Af Amer: 35 mL/min — ABNORMAL LOW (ref >90)
GFR, EST AFRICAN AMERICAN: 41 mL/min — AB (ref >90)
Glucose, Bld: 116 mg/dL — ABNORMAL HIGH (ref 70–99)
POTASSIUM: 4.4 meq/L (ref 3.7–5.3)
Sodium: 138 mEq/L (ref 137–147)

## 2014-06-13 LAB — GLUCOSE, CAPILLARY: GLUCOSE-CAPILLARY: 162 mg/dL — AB (ref 70–99)

## 2014-06-13 MED ORDER — DOCUSATE SODIUM 100 MG PO CAPS
100.0000 mg | ORAL_CAPSULE | Freq: Two times a day (BID) | ORAL | Status: DC
  Filled 2014-06-13 (×2): qty 1

## 2014-06-13 MED ORDER — POLYETHYLENE GLYCOL 3350 17 G PO PACK
17.0000 g | PACK | Freq: Every day | ORAL | Status: DC
  Administered 2014-06-13 – 2014-06-15 (×3): 17 g via ORAL
  Filled 2014-06-13 (×3): qty 1

## 2014-06-13 MED ORDER — SENNOSIDES-DOCUSATE SODIUM 8.6-50 MG PO TABS
1.0000 | ORAL_TABLET | Freq: Two times a day (BID) | ORAL | Status: DC
  Administered 2014-06-13 – 2014-06-15 (×5): 1 via ORAL
  Filled 2014-06-13 (×8): qty 1

## 2014-06-13 NOTE — Progress Notes (Signed)
Clinical Social Work  Patient was discussed during progression meeting and MD reports patient is not medically stable to DC today. CSW met with patient at bedside and reports she spoke with family and they might want to consider other options. Patient asked CSW to call son. CSW spoke with son via phone and reports he wants to know his other options and request a [facility]. CSW expanded search and will follow up with bed offers.  [place], [place] [phone]

## 2014-06-13 NOTE — Progress Notes (Signed)
Clinical Social Work  CSW followed up with patient at bedside to provide bed offers. Patient was on the phone with her son who participated in conversation as well. Patient and son to review offers and son will go to tour facilities prior to making any decisions. CSW will continue to follow.  [place], [place] [phone]

## 2014-06-13 NOTE — Progress Notes (Signed)
Pt stated that she wanted CPAP at around 3 am.  Pt notified to call if she decides she wants before then.  RT to monitor and assess as needed.

## 2014-06-13 NOTE — Evaluation (Signed)
Physical Therapy Evaluation Patient Details Name: Pamela [name] MRN: [id number] DOB: 05/14/1951 Today's Date: 06/13/2014   History of Present Illness  63 y.o. 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 Pt apparently admitted on 05/24/14 for gout flare and was taken off of iduretic medications. LE so edematous that pt having a hard time ambulating.   Clinical Impression  *Pt admitted with *CHF exacerbation**. Pt currently with functional limitations due to the deficits listed below (see PT Problem List).  Pt will benefit from skilled PT to increase their independence and safety with mobility to allow discharge to the venue listed below.   +2 assist (pt 40%) for supine to sit and sit to stand. Pt stood with RW for 6 minutes and performed weight shifting activities. SaO2 97% on 2L O2 with activity, HR 90. R knee pain limited standing tolerance. Ambulation deferred due to R knee pain and fall risk.  **    Follow Up Recommendations Supervision/Assistance - 24 hour;SNF    Equipment Recommendations  None recommended by PT    Recommendations for Other Services       Precautions / Restrictions Precautions Precautions: Fall Precaution Comments: pain in R hand and knee 2* gout/OA Restrictions Weight Bearing Restrictions: No      Mobility  Bed Mobility Overal bed mobility: Needs Assistance;+2 for physical assistance Bed Mobility: Supine to Sit     Supine to sit: Mod assist;+2 for safety/equipment;Max assist     General bed mobility comments: assist to  advance RLE and to raise trunk, pt 40%  Transfers Overall transfer  level: Needs assistance Equipment used: Rolling walker (2 wheeled) Transfers: Sit to/from Stand Sit to Stand: +2 physical assistance;Max assist;From elevated surface         General transfer comment: assist to rise, pt uses momentum and pulled up on RW, bed elevated; pt with 2/4 dyspnea with activity but SaO2 98% on 2L [place] with activity  Ambulation/Gait             General Gait Details: NT- 2* fall risk, R knee pain in standing  Stairs            Wheelchair Mobility    Modified Rankin (Stroke Patients Only)       Balance Overall balance assessment: Needs assistance   Sitting balance-Leahy Scale: Good     Standing balance support: Bilateral upper extremity supported Standing balance-Leahy Scale: Poor Standing balance comment: required BUE support; pt stood for 6 minutes with wide RW, performed weight shifting, was able to lift L and RLE                             Pertinent Vitals/Pain Pain Score: 5  Pain Location: R knee in standing Pain Descriptors / Indicators: Sore Pain Intervention(s): Premedicated before session;Limited activity within patient's tolerance;Monitored during session    Home Living Family/patient expects to be discharged to:: Private residence Living Arrangements: Children Available Help at Discharge: Family Type of Home: House Home Access: Stairs to enter Entrance Stairs-Rails: Chemical engineer of Steps: 1 Home Layout: Two level;Bed/bath upstairs Home Equipment: Environmental consultant - 2 wheels;Wheelchair - manual;Shower seat;Bedside commode;Cane - single  point;Other (comment) Additional Comments: home O2 on 2L    Prior Function Level of Independence: Needs assistance   Gait / Transfers Assistance Needed: walks with cane normally independent, has needed help with getting OOB last few days and was only able to take a few steps due to increased fluid retention in BLEs           Hand Dominance   Dominant Hand:  Left    Extremity/Trunk Assessment   Upper Extremity Assessment: RUE deficits/detail RUE Deficits / Details: decr grip R hand 2* gout pain in 1-3rd digits         Lower Extremity Assessment: RLE deficits/detail RLE Deficits / Details: knee extension +2/5 limited by pain, ankle WNL    Cervical / Trunk Assessment: Normal  Communication   Communication: No difficulties  Cognition Arousal/Alertness: Awake/alert Behavior During Therapy: WFL for tasks assessed/performed Overall Cognitive Status: Within Functional Limits for tasks assessed                      General Comments      Exercises        Assessment/Plan    PT Assessment Patient needs continued PT services  PT Diagnosis Difficulty walking;Generalized weakness;Acute pain   PT Problem List Decreased strength;Decreased activity tolerance;Decreased balance;Pain;Decreased mobility;Obesity  PT Treatment Interventions DME instruction;Gait training;Functional mobility training;Therapeutic activities;Patient/family education;Balance training;Therapeutic exercise   PT Goals (Current goals can be found in the Care Plan section) Acute Rehab PT Goals Patient Stated Goal: go back to rehab to get stronger PT Goal Formulation: With patient Time For Goal Achievement: 06/22/14 Potential to Achieve Goals: Fair    Frequency Min 3X/week   Barriers to discharge        Co-evaluation               End of Session Equipment Utilized During Treatment: Oxygen (pt on 2L continuously) Activity Tolerance: Patient limited by fatigue;Patient limited by pain Patient left: with call bell/phone within reach;in bed Nurse Communication: Mobility status         Time: 2919-1660 PT Time Calculation (min) (ACUTE ONLY): 51 min   Charges:   PT Evaluation $Initial PT Evaluation Tier I: 1 Procedure PT Treatments $Therapeutic Activity: 38-52 mins   PT G Codes:          Philomena Doheny 06/13/2014, 10:34  AM [phone]

## 2014-06-13 NOTE — Plan of Care (Signed)
Problem: Phase II Progression Outcomes Goal: Pain controlled Outcome: Completed/Met Date Met:  06/13/14 Goal: Dyspnea controlled with activity Outcome: Not Progressing Goal: Walk in hall or up in chair TID Outcome: Not Progressing Goal: Tolerating diet Outcome: Completed/Met Date Met:  06/13/14 Goal: Fluid volume status improved Outcome: Progressing

## 2014-06-13 NOTE — Progress Notes (Signed)
Patient ID: Pamela [name], female   DOB: 04/09/1951, 63 y.o.   MRN: [id number]  [facility] PROGRESS NOTE  Sean Macwilliams WNU:[id number] DOB: April 02, 1951 DOA: 06/11/2014 PCP: MATTHEWS,MICHELLE A., MD  Brief narrative: 63 yo female with history of asthma,, sarcoidosis (on chronic low-dose prednisone), hx of CHF, right upper lobe lung cancer who recently relocated from [place] (was following with a pulmonologist and oncologist there), OSA on CPAP , admitted recently for acute polyarticular gouty arthritis and AKI on CKD. She was discharged to SNF. Her lasix and aldactone were held due to AKI upon discharge with recommendations to resume once renal function stable as outpt. patient got discharged home from SNF few days PTA but was still off lasix. She now presented to the ED with increased b/l leg swelling with difficulty ambulating.  Assessment and Plan:   Acute on chronic hypoxic respiratory failure secondary to diastolic CHF imposed on OSA/OHS - EF 65-70% w/ Grade 1 diastolic dysfunction as of 05/27/14.  - BNP on admission 1193. CXR shows venous congestion.  - Placed on IV lasix on admission  - pt responding well with weight trending down since admission: 355 lbs --> 349 lbs  - Resumed aldactone - BP and respiratory status stable  Chronic gout, recent flare - On prednisone taper, resume to chronic home dose in 5 days (10 mg daily for chronic sarcoidosis) -Pain control with prn percocet Chronic kidney disease stage III - Baseline creatinine of 1.3 - Cr still elevated at 1.5 and likely from diuresis with lasix - continue to monitor  OSA on CPAP - On CPAP at night. Has a pressure setting of 15 . History of sarcoidosis - On chronic low-dose prednisone 10 mg daily. Follow up with pulmonary as outpt Hypertension - Blood pressure stable. Hold HCTZ Monitor on lasix and aldactone Morbid obesity with deconditioning  - PT eval requested. recently discharged from SNF post  hospitalization - Pt agreeable to going to SNF - will be ready in 24- 48 hours  Hx of right lung mass - Status post radiation with CT chest results done 6 months back showing unchanged result.  - Follow as outpt Anemia, iron def - Continue iron supplement. Prediabetes - A1c of 6.4. Monitor on SSI History of Asthma - Stable . Continue dulera. Follows with Dr Melvyn Novas  DVT prophylaxis  Heparin SQ while pt is in hospital  Code Status: Full Family Communication: Pt at bedside Disposition Plan: SNF Friday (unable to d/c on holiday)  IV Access:   Peripheral IV Procedures and diagnostic studies:    Dg Chest 2 View  06/11/2014  Cardiomegaly and venous congestion. Right perihilar atelectasis. Medical Consultants:   None  Other Consultants:   Physical therapy  Anti-Infectives:   None   Faye Ramsay, MD  [facility] Pager [phone]  If 7PM-7AM, please contact night-coverage [email or web address] Password [email or web address] 06/13/2014, 10:54 AM   LOS: 2 days   HPI/Subjective: No events overnight.   Objective: Filed Vitals:   06/12/14 2144 06/13/14 0500 06/13/14 0615 06/13/14 0907  BP: 114/49  110/59   Pulse: 75  66   Temp: 98.6 F (37 C)  98.9 F (37.2 C)   TempSrc: Oral  Oral   Resp: 18  18   Height:      Weight:  158.362 kg (349 lb 2 oz)    SpO2: 100%  98% 97%    Intake/Output Summary (Last 24 hours) at 06/13/14 1054 Last data filed at 06/13/14 0540  Gross per 24 hour  Intake  0 ml  Output   2250 ml  Net  -2250 ml    Exam:   General:  Pt is alert, follows commands appropriately, not in acute distress  Cardiovascular: Regular rate and rhythm, S1/S2, no murmurs, no rubs, no gallops  Respiratory: Clear to auscultation bilaterally, no wheezing, diminished breath sounds at bases   Abdomen: Soft, non tender, non distended, bowel sounds present, no guarding  Extremities: Bilateral LE pitting edema with chronic venous stasis changes   Neuro: Grossly nonfocal  Data  Reviewed: Basic Metabolic Panel:  Recent Labs Lab 06/11/14 1929 06/12/14 0411 06/13/14 0525  NA 135* 140 138  K 4.5 4.2 4.4  CL 98 101 96  CO2 25 27 29   GLUCOSE 125* 103* 116*  BUN 27* 27* 28*  CREATININE 1.51* 1.57* 1.54*  CALCIUM 9.2 9.2 8.8   Liver Function Tests:  Recent Labs Lab 06/11/14 1929  AST 32  ALT 28  ALKPHOS 298*  BILITOT 1.0  PROT 6.6  ALBUMIN 2.5*   CBC:  Recent Labs Lab 06/11/14 1929 06/12/14 0411  WBC 9.9 7.9  NEUTROABS 8.8*  --   HGB 8.1* 8.1*  HCT 25.0* 25.5*  MCV 67.0* 68.4*  PLT 178 169   Scheduled Meds: . aspirin  81 mg Oral Daily  . furosemide  20 mg Intravenous BID  . heparin  5,000 Units Subcutaneous 3 times per day  . mometasone-formoterol  2 puff Inhalation BID  . polyethylene glycol  17 g Oral Daily  . [START ON 06/18/2014] predniSONE  10 mg Oral Q breakfast  . predniSONE  20 mg Oral Q breakfast  . senna-docusate  1 tablet Oral BID  . sodium chloride  3 mL Intravenous Q12H  . spironolactone  25 mg Oral Daily   Continuous Infusions:

## 2014-06-14 LAB — CBC
HEMATOCRIT: 26.5 % — AB (ref 36.0–46.0)
Hemoglobin: 8.2 g/dL — ABNORMAL LOW (ref 12.0–15.0)
MCH: 21 pg — ABNORMAL LOW (ref 26.0–34.0)
MCHC: 30.9 g/dL (ref 30.0–36.0)
MCV: 67.9 fL — ABNORMAL LOW (ref 78.0–100.0)
Platelets: 186 10*3/uL (ref 150–400)
RBC: 3.9 MIL/uL (ref 3.87–5.11)
RDW: 14.7 % (ref 11.5–15.5)
WBC: 6.1 10*3/uL (ref 4.0–10.5)

## 2014-06-14 LAB — BASIC METABOLIC PANEL
Anion gap: 14 (ref 5–15)
BUN: 30 mg/dL — AB (ref 6–23)
CO2: 29 meq/L (ref 19–32)
Calcium: 8.8 mg/dL (ref 8.4–10.5)
Chloride: 93 mEq/L — ABNORMAL LOW (ref 96–112)
Creatinine, Ser: 1.35 mg/dL — ABNORMAL HIGH (ref 0.50–1.10)
GFR calc Af Amer: 48 mL/min — ABNORMAL LOW (ref >90)
GFR calc non Af Amer: 41 mL/min — ABNORMAL LOW (ref >90)
Glucose, Bld: 99 mg/dL (ref 70–99)
POTASSIUM: 4.6 meq/L (ref 3.7–5.3)
SODIUM: 136 meq/L — AB (ref 137–147)

## 2014-06-14 NOTE — Progress Notes (Signed)
Patient ID: Pamela [name], female   DOB: 1950/12/10, 63 y.o.   MRN: [id number]  [facility] PROGRESS NOTE  Reda Citron HAL:[id number] DOB: September 03, 1950 DOA: 06/11/2014 PCP: MATTHEWS,MICHELLE A., MD   Brief narrative: 63 yo female with history of asthma,, sarcoidosis (on chronic low-dose prednisone), hx of CHF, right upper lobe lung cancer who recently relocated from [place] (was following with a pulmonologist and oncologist there), OSA on CPAP , admitted recently for acute polyarticular gouty arthritis and AKI on CKD. She was discharged to SNF. Her lasix and aldactone were held due to AKI upon discharge with recommendations to resume once renal function stable as outpt. patient got discharged home from SNF few days PTA but was still off lasix. She now presented to the ED with increased b/l leg swelling with difficulty ambulating.  Assessment and Plan:   Acute on chronic hypoxic respiratory failure secondary to diastolic CHF imposed on OSA/OHS - EF 65-70% w/ Grade 1 diastolic dysfunction as of 05/27/14.  - BNP on admission 1193. CXR shows venous congestion.  - Placed on IV lasix on admission  - pt responding well with weight trending down since admission: 355 lbs --> 349 lbs --> 346 lbs  - Resumed aldactone - BP and respiratory status stable  Chronic gout, recent flare - On prednisone taper, resume to chronic home dose in 5 days (10 mg daily for chronic sarcoidosis) -Pain control with prn percocet Chronic kidney disease stage III - Baseline creatinine of 1.3 - Cr still elevated but trending down towards baseline  - continue to monitor  OSA on CPAP - On CPAP at night. Has a pressure setting of 15 History of sarcoidosis - On chronic low-dose prednisone 10 mg daily. Follow up with pulmonary as outpt Hypertension - Blood pressure stable. Hold HCTZ Monitor on lasix and aldactone Morbid obesity with deconditioning  - PT eval requested. recently discharged from SNF post  hospitalization - Pt agreeable to going to SNF - will be ready in AM Hx of right lung mass - Status post radiation with CT chest results done 6 months back showing unchanged result.  - Follow as outpt Anemia, iron def - Continue iron supplement. Prediabetes - A1c of 6.4. Monitor on SSI History of Asthma - Stable . Continue dulera. Follows with Dr Melvyn Novas  DVT prophylaxis  Heparin SQ while pt is in hospital  Code Status: Full Family Communication: Pt at bedside Disposition Plan: SNF Friday (unable to d/c on holiday)  IV Access:    Peripheral IV Procedures and diagnostic studies:    Dg Chest 2 View 06/11/2014 Cardiomegaly and venous congestion. Right perihilar atelectasis. Medical Consultants:    None  Other Consultants:    Physical therapy  Anti-Infectives:    None    Faye Ramsay, MD  [facility] Pager [phone]  If 7PM-7AM, please contact night-coverage [email or web address] Password [facility] 06/14/2014, 7:46 AM   LOS: 3 days   HPI/Subjective: No events overnight.   Objective: Filed Vitals:   06/13/14 1333 06/13/14 2022 06/13/14 2120 06/14/14 0610  BP: 113/58  114/97 112/65  Pulse: 70  77 67  Temp: 98.7 F (37.1 C)  97.2 F (36.2 C) 98.1 F (36.7 C)  TempSrc: Oral  Oral Oral  Resp: 18  18 18   Height:      Weight:    157.262 kg (346 lb 11.2 oz)  SpO2: 96% 95% 94% 100%    Intake/Output Summary (Last 24 hours) at 06/14/14 0746 Last data filed at 06/14/14 0634  Gross per 24 hour  Intake      0 ml  Output   3250 ml  Net  -3250 ml    Exam:   General:  Pt is alert, follows commands appropriately, not in acute distress  Cardiovascular: Regular rate and rhythm, no rubs, no gallops  Respiratory: Clear to auscultation bilaterally, no wheezing, no crackles, no rhonchi  Abdomen: Soft, non tender, non distended, bowel sounds present, no guarding  Extremities: chronic bilateral venous stasis with edema R>L  Neuro: Grossly nonfocal  Data  Reviewed: Basic Metabolic Panel:  Recent Labs Lab 06/11/14 1929 06/12/14 0411 06/13/14 0525 06/14/14 0525  NA 135* 140 138 136*  K 4.5 4.2 4.4 4.6  CL 98 101 96 93*  CO2 25 27 29 29   GLUCOSE 125* 103* 116* 99  BUN 27* 27* 28* 30*  CREATININE 1.51* 1.57* 1.54* 1.35*  CALCIUM 9.2 9.2 8.8 8.8   Liver Function Tests:  Recent Labs Lab 06/11/14 1929  AST 32  ALT 28  ALKPHOS 298*  BILITOT 1.0  PROT 6.6  ALBUMIN 2.5*   No results for input(s): LIPASE, AMYLASE in the last 168 hours. No results for input(s): AMMONIA in the last 168 hours. CBC:  Recent Labs Lab 06/11/14 1929 06/12/14 0411 06/14/14 0525  WBC 9.9 7.9 6.1  NEUTROABS 8.8*  --   --   HGB 8.1* 8.1* 8.2*  HCT 25.0* 25.5* 26.5*  MCV 67.0* 68.4* 67.9*  PLT 178 169 186   CBG:  Recent Labs Lab 06/13/14 2108  GLUCAP 162*   Scheduled Meds: . aspirin  81 mg Oral Daily  . furosemide  20 mg Intravenous BID  . heparin  5,000 Units Subcutaneous 3 times per day  . mometasone-formoterol  2 puff Inhalation BID  . polyethylene glycol  17 g Oral Daily  . [START ON 06/18/2014] predniSONE  10 mg Oral Q breakfast  . predniSONE  20 mg Oral Q breakfast  . senna-docusate  1 tablet Oral BID  . sodium chloride  3 mL Intravenous Q12H  . spironolactone  25 mg Oral Daily   Continuous Infusions:

## 2014-06-15 DIAGNOSIS — I129 Hypertensive chronic kidney disease with stage 1 through stage 4 chronic kidney disease, or unspecified chronic kidney disease: Secondary | ICD-10-CM | POA: Diagnosis not present

## 2014-06-15 DIAGNOSIS — J45909 Unspecified asthma, uncomplicated: Secondary | ICD-10-CM | POA: Diagnosis not present

## 2014-06-15 DIAGNOSIS — D869 Sarcoidosis, unspecified: Secondary | ICD-10-CM | POA: Diagnosis not present

## 2014-06-15 DIAGNOSIS — I5030 Unspecified diastolic (congestive) heart failure: Secondary | ICD-10-CM | POA: Diagnosis not present

## 2014-06-15 DIAGNOSIS — I10 Essential (primary) hypertension: Secondary | ICD-10-CM | POA: Diagnosis not present

## 2014-06-15 DIAGNOSIS — N179 Acute kidney failure, unspecified: Secondary | ICD-10-CM | POA: Diagnosis not present

## 2014-06-15 DIAGNOSIS — M109 Gout, unspecified: Secondary | ICD-10-CM | POA: Diagnosis not present

## 2014-06-15 DIAGNOSIS — I5032 Chronic diastolic (congestive) heart failure: Secondary | ICD-10-CM | POA: Diagnosis not present

## 2014-06-15 DIAGNOSIS — G4733 Obstructive sleep apnea (adult) (pediatric): Secondary | ICD-10-CM | POA: Diagnosis not present

## 2014-06-15 DIAGNOSIS — D509 Iron deficiency anemia, unspecified: Secondary | ICD-10-CM | POA: Diagnosis not present

## 2014-06-15 DIAGNOSIS — N183 Chronic kidney disease, stage 3 (moderate): Secondary | ICD-10-CM | POA: Diagnosis not present

## 2014-06-15 DIAGNOSIS — R609 Edema, unspecified: Secondary | ICD-10-CM | POA: Diagnosis not present

## 2014-06-15 DIAGNOSIS — M1A9XX0 Chronic gout, unspecified, without tophus (tophi): Secondary | ICD-10-CM | POA: Diagnosis not present

## 2014-06-15 DIAGNOSIS — M1000 Idiopathic gout, unspecified site: Secondary | ICD-10-CM | POA: Diagnosis not present

## 2014-06-15 DIAGNOSIS — Z9981 Dependence on supplemental oxygen: Secondary | ICD-10-CM | POA: Diagnosis not present

## 2014-06-15 DIAGNOSIS — C3490 Malignant neoplasm of unspecified part of unspecified bronchus or lung: Secondary | ICD-10-CM | POA: Diagnosis not present

## 2014-06-15 DIAGNOSIS — J9621 Acute and chronic respiratory failure with hypoxia: Secondary | ICD-10-CM | POA: Diagnosis not present

## 2014-06-15 DIAGNOSIS — R600 Localized edema: Secondary | ICD-10-CM | POA: Diagnosis not present

## 2014-06-15 DIAGNOSIS — J449 Chronic obstructive pulmonary disease, unspecified: Secondary | ICD-10-CM | POA: Diagnosis not present

## 2014-06-15 DIAGNOSIS — I5020 Unspecified systolic (congestive) heart failure: Secondary | ICD-10-CM | POA: Diagnosis not present

## 2014-06-15 DIAGNOSIS — M6281 Muscle weakness (generalized): Secondary | ICD-10-CM | POA: Diagnosis not present

## 2014-06-15 LAB — CBC
HEMATOCRIT: 26.4 % — AB (ref 36.0–46.0)
HEMOGLOBIN: 8.3 g/dL — AB (ref 12.0–15.0)
MCH: 21.4 pg — ABNORMAL LOW (ref 26.0–34.0)
MCHC: 31.4 g/dL (ref 30.0–36.0)
MCV: 68 fL — AB (ref 78.0–100.0)
Platelets: 211 10*3/uL (ref 150–400)
RBC: 3.88 MIL/uL (ref 3.87–5.11)
RDW: 14.7 % (ref 11.5–15.5)
WBC: 6.7 10*3/uL (ref 4.0–10.5)

## 2014-06-15 LAB — BASIC METABOLIC PANEL
Anion gap: 11 (ref 5–15)
BUN: 33 mg/dL — AB (ref 6–23)
CHLORIDE: 94 meq/L — AB (ref 96–112)
CO2: 32 mEq/L (ref 19–32)
Calcium: 8.8 mg/dL (ref 8.4–10.5)
Creatinine, Ser: 1.41 mg/dL — ABNORMAL HIGH (ref 0.50–1.10)
GFR calc Af Amer: 46 mL/min — ABNORMAL LOW (ref >90)
GFR calc non Af Amer: 39 mL/min — ABNORMAL LOW (ref >90)
GLUCOSE: 85 mg/dL (ref 70–99)
POTASSIUM: 4.4 meq/L (ref 3.7–5.3)
Sodium: 137 mEq/L (ref 137–147)

## 2014-06-15 MED ORDER — OXYCODONE HCL 5 MG PO CAPS: 5.0000 mg | ORAL_CAPSULE | ORAL | Status: DC | PRN

## 2014-06-15 MED ORDER — SPIRONOLACTONE 25 MG PO TABS: 25.0000 mg | ORAL_TABLET | Freq: Every day | ORAL | Status: DC

## 2014-06-15 MED ORDER — SENNOSIDES-DOCUSATE SODIUM 8.6-50 MG PO TABS: 1.0000 | ORAL_TABLET | Freq: Two times a day (BID) | ORAL | Status: DC

## 2014-06-15 MED ORDER — ONDANSETRON HCL 4 MG PO TABS: 4.0000 mg | ORAL_TABLET | Freq: Four times a day (QID) | ORAL | Status: DC | PRN

## 2014-06-15 MED ORDER — FUROSEMIDE 20 MG PO TABS: 20.0000 mg | ORAL_TABLET | Freq: Two times a day (BID) | ORAL | Status: DC

## 2014-06-15 NOTE — Progress Notes (Signed)
Patient discharge to SNF, alert and oriented discharge package given to Medical Transportation for delivery to SNF [facility]), patient in stable condition at this time

## 2014-06-15 NOTE — Progress Notes (Signed)
CSW confirmed with pt and son of pt discharge. [facility] transportation arranged for 12pm as agreed upon by RN. CSW ensured with facility that she will receive lunch at [place]. RN to call report [phone]. No further Clinical Social Work needs, signing off.   Noreene Larsson [phone]  ED CSW 06/15/2014 1124am

## 2014-06-15 NOTE — Progress Notes (Signed)
Visited with patient for early morning CPAP placement. Upon arrival to room, patient awake talking to phlebotomist. Patient states "I told her I want it at 3 and I am woke for the day now". She is aware that she may call for RT assistance at any time if she should wish to have placement for sleep including naps. RT to continue to follow.

## 2014-06-15 NOTE — Clinical Social Work Placement (Signed)
Clinical Social Work Department CLINICAL SOCIAL WORK PLACEMENT NOTE 06/15/2014  Patient:  Pamela [name],Pamela [name]  Account Number:  [id number] [place] date:  06/11/2014  Clinical Social Worker:  Sindy Messing, LCSW  Date/time:  06/12/2014 03:45 PM  Clinical Social Work is seeking post-discharge placement for this patient at the following level of care:   [place]   (*CSW will update this form in Epic as items are completed)   06/12/2014  Patient/family provided with [place] Department of Clinical Social Work's list of facilities offering this level of care within the geographic area requested by the patient (or if unable, by the patient's family).  06/12/2014  Patient/family informed of their freedom to choose among providers that offer the needed level of care, that participate in Medicare, Medicaid or managed care program needed by the patient, have an available bed and are willing to accept the patient.  06/12/2014  Patient/family informed of [facility]' ownership interest in [facility], as well as of the fact that they are under no obligation to receive care at this facility.  PASARR submitted to EDS on 06/12/2014 PASARR number received on 06/12/2014  FL2 transmitted to all facilities in geographic area requested by pt/family on  06/12/2014 FL2 transmitted to all facilities within larger geographic area on   Patient informed that his/her managed care company has contracts with or will negotiate with  certain facilities, including the following:     Patient/family informed of bed offers received:  06/13/2014 Patient chooses bed at [place] Physician recommends and patient chooses bed at    Patient to be transferred to [place] on  06/15/2014 Patient to be transferred to facility by [facility]tar Patient and family notified of transfer on 06/15/2014 Name of family member notified:  Merrie Roof  The following physician  request were entered in Epic:   Additional Comments:   Noreene Larsson [phone]  ED CSW 06/15/2014 1123am

## 2014-06-15 NOTE — Progress Notes (Signed)
CSW met with pt at bedside who stated they have chosen [facility] for snf for short term rehab. CSW confirmed with Davy Pique at [employer] that they are ready for patient. Pt gave CSW permission to speak with pt son to confirm dc plans. CSW spoke with pt son who plans to meet patient over at [facility] once she has been discharged.   Noreene Larsson [phone]  ED CSW 06/15/2014 1008am

## 2014-06-15 NOTE — Discharge Summary (Signed)
Physician Discharge Summary  Pamela [name] EYC:[id number] DOB: 1951/06/10 DOA: 06/11/2014  PCP: MATTHEWS,MICHELLE A., MD  Admit date: 06/11/2014 Discharge date: 06/15/2014  Recommendations for Outpatient Follow-up:  1. Pt will need to follow up with PCP in 2-3 weeks post discharge 2. Please obtain BMP to evaluate electrolytes and kidney function 3. Please also check CBC to evaluate Hg and Hct levels 4. Please note that Lasix was added to pt's regimen 20 mg PO BID, readjust the dose as clinically indicated 5. Weight on discharge 346 lbs 6. Please also note that Spironolactone was added to the regimen 7. Please resume Prednisone to chronic home dose in 5 days (10 mg PO QD)  Discharge Diagnoses:  Principal Problem:   Chronic diastolic CHF (congestive heart failure) Active Problems:   Essential hypertension   Lower extremity edema   Morbid obesity   Sarcoidosis   Obstructive sleep apnea   CHF (congestive heart failure)  Discharge Condition: Stable  Diet recommendation: Heart healthy diet discussed in details   Brief narrative: 63 yo female with history of asthma,, sarcoidosis (on chronic low-dose prednisone), hx of CHF, right upper lobe lung cancer who recently relocated from [place] (was following with a pulmonologist and oncologist there), OSA on CPAP , admitted recently for acute polyarticular gouty arthritis and AKI on CKD. She was discharged to SNF. Her lasix and aldactone were held due to AKI upon discharge with recommendations to resume once renal function stable as outpt. patient got discharged home from SNF few days PTA but was still off lasix. She now presented to the ED with increased b/l leg swelling with difficulty ambulating.  Assessment and Plan:   Acute on chronic hypoxic respiratory failure secondary to diastolic CHF imposed on OSA/OHS - EF 65-70% w/ Grade 1 diastolic dysfunction as of 05/27/14.  - BNP on admission 1193. CXR shows venous congestion.  -  Placed on IV lasix on admission  - pt responding well with weight trending down since admission: 355 lbs --> 349 lbs --> 346 lbs and remains stable over the past 24 hours - transition to oral lasix upon discharge 20 mg PO BID  - Resumed aldactone - BP and respiratory status stable  Chronic gout, recent flare - On prednisone taper, resume to chronic home dose in 5 days (10 mg daily for chronic sarcoidosis) -Pain control with prn percocet Chronic kidney disease stage III - Baseline creatinine of 1.3 - Cr still elevated, I suspect will get better with changing Lasix to PO upon discharge  - continue to monitor in an outpatient setting  OSA on CPAP - On CPAP at night. Has a pressure setting of 15 History of sarcoidosis - On chronic low-dose prednisone 10 mg daily. Follow up with pulmonary as outpt Hypertension - Blood pressure stable. Monitor on lasix and aldactone Morbid obesity with deconditioning  - PT eval requested. recently discharged from SNF post hospitalization - Pt agreeable to going to SNF Hx of right lung mass - Status post radiation with CT chest results done 6 months back showing unchanged result.  - Follow as outpt Anemia, iron def - Continue iron supplement. Prediabetes - A1c of 6.4. History of Asthma - Stable . Continue dulera. Follows with Dr Melvyn Novas  DVT prophylaxis  Heparin SQ while pt is in hospital  Code Status: Full Family Communication: Pt at bedside Disposition Plan: SNF   IV Access:    Peripheral IV Procedures and diagnostic studies:    Dg Chest 2 View 06/11/2014 Cardiomegaly and venous congestion. Right  perihilar atelectasis. Medical Consultants:    None  Other Consultants:    Physical therapy  Anti-Infectives:    None   Discharge Exam: Filed Vitals:   06/15/14 0552  BP: 139/69  Pulse: 67  Temp: 98.6 F (37 C)  Resp: 18   Filed Vitals:   06/14/14 1432 06/14/14 1959 06/14/14 2139 06/15/14 0552  BP: 101/47   116/55 139/69  Pulse: 71  67 67  Temp: 98.7 F (37.1 C)  98.5 F (36.9 C) 98.6 F (37 C)  TempSrc: Oral  Oral Oral  Resp: 18  20 18   Height:      Weight:      SpO2: 99% 100% 99% 100%    General: Pt is alert, follows commands appropriately, not in acute distress Cardiovascular: Regular rate and rhythm, no rubs, no gallops Respiratory: Clear to auscultation bilaterally, no wheezing, no crackles, no rhonchi Abdominal: Soft, non tender, non distended, bowel sounds +, no guarding Extremities: chronic bilateral LE venous stasis with edema, R > L Neuro: Grossly nonfocal  Discharge Instructions  Discharge Instructions    Diet - low sodium heart healthy    Complete by:  As directed      Increase activity slowly    Complete by:  As directed             Medication List    STOP taking these medications        docusate sodium 100 MG capsule  Commonly known as:  COLACE     magnesium (amino acid chelate) 133 MG tablet     OVER THE COUNTER MEDICATION      TAKE these medications        aspirin 81 MG tablet  Take 81 mg by mouth daily.     CALTRATE 600+D PO  Take 1 tablet by mouth 1 day or 1 dose.     febuxostat 40 MG tablet  Commonly known as:  ULORIC  Take 40 mg by mouth daily.     ferrous sulfate 325 (65 FE) MG tablet  Take 1 tablet (325 mg total) by mouth 2 (two) times daily with a meal.     furosemide 20 MG tablet  Commonly known as:  LASIX  Take 1 tablet (20 mg total) by mouth 2 (two) times daily.     mometasone-formoterol 100-5 MCG/ACT Aero  Commonly known as:  DULERA  Take 2 puffs first thing in am and then another 2 puffs about 12 hours later.     ondansetron 4 MG tablet  Commonly known as:  ZOFRAN  Take 1 tablet (4 mg total) by mouth every 6 (six) hours as needed for nausea.     oxycodone 5 MG capsule  Commonly known as:  OXY-IR  Take 1 capsule (5 mg total) by mouth every 4 (four) hours as needed for pain.     predniSONE 20 MG tablet  Commonly known  as:  DELTASONE  Take 1 tablet (20 mg total) by mouth daily with breakfast. For 10 days for gout, then return to 10mg  daily thereafter for sarcoid (until assessed by Dr. Melvyn Novas)     senna-docusate 8.6-50 MG per tablet  Commonly known as:  Senokot-S  Take 1 tablet by mouth 2 (two) times daily.     spironolactone 25 MG tablet  Commonly known as:  ALDACTONE  Take 1 tablet (25 mg total) by mouth daily.           Follow-up Information    Follow up with MATTHEWS,MICHELLE  A., MD.   Specialty:  Internal Medicine   Contact information:   [address] [place] [address] [phone]       Follow up with Faye Ramsay, MD.   Specialty:  Internal Medicine   Why:  As needed call my cell phone [phone]   Contact information:   [address] [place] [address] [phone]        The results of significant diagnostics from this hospitalization (including imaging, microbiology, ancillary and laboratory) are listed below for reference.     Microbiology: No results found for this or any previous visit (from the past 240 hour(s)).   Labs: Basic Metabolic Panel:  Recent Labs Lab 06/11/14 1929 06/12/14 0411 06/13/14 0525 06/14/14 0525 06/15/14 0448  NA 135* 140 138 136* 137  K 4.5 4.2 4.4 4.6 4.4  CL 98 101 96 93* 94*  CO2 25 27 29 29  32  GLUCOSE 125* 103* 116* 99 85  BUN 27* 27* 28* 30* 33*  CREATININE 1.51* 1.57* 1.54* 1.35* 1.41*  CALCIUM 9.2 9.2 8.8 8.8 8.8   Liver Function Tests:  Recent Labs Lab 06/11/14 1929  AST 32  ALT 28  ALKPHOS 298*  BILITOT 1.0  PROT 6.6  ALBUMIN 2.5*   CBC:  Recent Labs Lab 06/11/14 1929 06/12/14 0411 06/14/14 0525 06/15/14 0448  WBC 9.9 7.9 6.1 6.7  NEUTROABS 8.8*  --   --   --   HGB 8.1* 8.1* 8.2* 8.3*  HCT 25.0* 25.5* 26.5* 26.4*  MCV 67.0* 68.4* 67.9* 68.0*  PLT 178 169 186 211    BNP (last 3 results)  Recent Labs  04/13/14 1234 05/24/14 0700 06/11/14 1929  PROBNP  1144.00* 1235.0* 1193.0*   CBG:  Recent Labs Lab 06/13/14 2108  GLUCAP 162*     SIGNED: Time coordinating discharge: Over 30 minutes  Faye Ramsay, MD  [facility] 06/15/2014, 9:56 AM Pager [phone]  If 7PM-7AM, please contact night-coverage [email or web address] Password [email or web address]

## 2014-06-15 NOTE — Progress Notes (Signed)
Physical Therapy Treatment Patient Details Name: Pamela [name] MRN: [id number] DOB: [birth year] Today's Date: 06/15/2014    History of Present Illness 63 y.o. 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 Pt apparently admitted on 05/24/14 for gout flare and was taken off of iduretic medications. LE so edematous that pt having a hard time ambulating.     PT Comments    Assisted pt to EOB + 2 assist.  Sat EOB x 12 while trying to pych self up to attempt standing.  Attempted x 4 pt was unable to clear hips off bed.  Pt c/o r knee pain and R hand pain unable to grip walker.  Unable to stand, assisted pt back to supine + 2 total assist esp to scoot to [facility].  Follow Up Recommendations  SNF     Equipment Recommendations       Recommendations for Other Services       Precautions / Restrictions Precautions Precautions: Fall Precaution Comments: pain in R hand and knee 2* gout/OA Restrictions Weight Bearing Restrictions: No    Mobility  Bed Mobility Overal bed mobility: Needs Assistance;+2 for physical assistance Bed Mobility: Supine to Sit;Sit to Supine     Supine to sit: +2 for physical assistance;+2 for safety/equipment;Total assist Sit to supine: Total assist;+2 for physical assistance;+2 for safety/equipment   General bed mobility comments: pt 20%  Transfers                    Ambulation/Gait                 Stairs            Wheelchair Mobility    Modified Rankin (Stroke Patients Only)       Balance                                    Cognition                             Exercises      General Comments        Pertinent Vitals/Pain Pain Score: 5  Pain Location: r knee Pain Descriptors / Indicators: Sore Pain Intervention(s): Monitored during session;Repositioned    Home Living                      Prior Function            PT Goals (current goals can now be found in the care plan section) Progress towards PT goals: Progressing toward goals    Frequency  Min 3X/week    PT Plan      Co-evaluation             End of Session Equipment Utilized During Treatment: Oxygen Activity Tolerance: Patient limited by fatigue;Patient limited by pain Patient left: in bed;with call bell/phone within reach     Time: 0957-1022 PT Time Calculation (min) (ACUTE ONLY): 25 min  Charges:  $Therapeutic Activity: 23-37 mins  G Codes:      Rica Koyanagi  PTA [facility]  Acute  Rehab Pager      [phone]

## 2014-06-15 NOTE — Progress Notes (Signed)
RT checked with patient about her CPAP. She was not ready and asked me to come back because she was watching a movie.

## 2014-06-15 NOTE — Discharge Instructions (Signed)
Edema  Edema is an abnormal buildup of fluids. It is more common in your legs and thighs. Painless swelling of the feet and ankles is more likely as a person ages. It also is common in looser skin, like around your eyes.  HOME CARE   · Keep the affected body part above the level of the heart while lying down.  · Do not sit still or stand for a long time.  · Do not put anything right under your knees when you lie down.  · Do not wear tight clothes on your upper legs.  · Exercise your legs to help the puffiness (swelling) go down.  · Wear elastic bandages or support stockings as told by your doctor.  · A low-salt diet may help lessen the puffiness.  · Only take medicine as told by your doctor.  GET HELP IF:  · Treatment is not working.  · You have heart, liver, or kidney disease and notice that your skin looks puffy or shiny.  · You have puffiness in your legs that does not get better when you raise your legs.  · You have sudden weight gain for no reason.  GET HELP RIGHT AWAY IF:   · You have shortness of breath or chest pain.  · You cannot breathe when you lie down.  · You have pain, redness, or warmth in the areas that are puffy.  · You have heart, liver, or kidney disease and get edema all of a sudden.  · You have a fever and your symptoms get worse all of a sudden.  MAKE SURE YOU:   · Understand these instructions.  · Will watch your condition.  · Will get help right away if you are not doing well or get worse.  Document Released: 12/23/2007 Document Revised: 07/11/2013 Document Reviewed: 04/28/2013  ExitCare® Patient Information ©2015 [employer]. This information is not intended to replace advice given to you by your health care provider. Make sure you discuss any questions you have with your health care provider.

## 2014-06-18 ENCOUNTER — Ambulatory Visit: Payer: Medicare Other | Admitting: Internal Medicine

## 2014-06-18 ENCOUNTER — Non-Acute Institutional Stay (SKILLED_NURSING_FACILITY): Payer: Medicare Other | Admitting: Internal Medicine

## 2014-06-18 DIAGNOSIS — I10 Essential (primary) hypertension: Secondary | ICD-10-CM | POA: Diagnosis not present

## 2014-06-18 DIAGNOSIS — E8881 Metabolic syndrome: Secondary | ICD-10-CM

## 2014-06-18 DIAGNOSIS — D869 Sarcoidosis, unspecified: Secondary | ICD-10-CM

## 2014-06-18 DIAGNOSIS — J45909 Unspecified asthma, uncomplicated: Secondary | ICD-10-CM

## 2014-06-18 DIAGNOSIS — I5032 Chronic diastolic (congestive) heart failure: Secondary | ICD-10-CM | POA: Diagnosis not present

## 2014-06-18 DIAGNOSIS — J9621 Acute and chronic respiratory failure with hypoxia: Secondary | ICD-10-CM | POA: Diagnosis not present

## 2014-06-18 DIAGNOSIS — M109 Gout, unspecified: Secondary | ICD-10-CM

## 2014-06-18 DIAGNOSIS — N179 Acute kidney failure, unspecified: Secondary | ICD-10-CM | POA: Diagnosis not present

## 2014-06-18 DIAGNOSIS — G4733 Obstructive sleep apnea (adult) (pediatric): Secondary | ICD-10-CM

## 2014-06-18 DIAGNOSIS — N183 Chronic kidney disease, stage 3 (moderate): Secondary | ICD-10-CM

## 2014-06-18 DIAGNOSIS — Z85118 Personal history of other malignant neoplasm of bronchus and lung: Secondary | ICD-10-CM

## 2014-06-18 NOTE — Progress Notes (Addendum)
MRN: [id number] Name: Pamela [name]  Sex: female Age: 63 y.o. DOB: 03/26/1951  [place] #: Helene Kelp Facility/Room: 307 Level Of Care: SNF Provider: Inocencio Homes D Emergency Contacts: Extended Emergency Contact Information Primary Emergency Contact: Shuster,Orlandis Address: [place],  [address] [address] Phone: [phone] Work Phone: [phone] Mobile Phone: [phone] Relation: Son Secondary Emergency Contact: Drinkard,Candace  [place] Phone: [phone] Relation: Other    Allergies: Sulfur  Chief Complaint  Patient presents with  . New Admit To SNF    HPI: Patient is 63 y.o. female with morbid obesity, OSA, CHF, and sarcoid who is admitted to SNF for generalized weakness after being hospitalized for acute on chronic CHF.  Past Medical History  Diagnosis Date  . CHF (congestive heart failure)   . Asthma   . COPD (chronic obstructive pulmonary disease)   . Arthritis   . Sarcoidosis   . Cancer     lung  . Hypertension   . Sleep apnea   . Oxygen deficiency     Past Surgical History  Procedure Laterality Date  . Lung biopsy    . Tubal ligation    . Vein ligation and stripping        Medication List       This list is accurate as of: 06/18/14 11:59 PM.  Always use your most recent med list.               aspirin 81 MG tablet  Take 81 mg by mouth daily.     CALTRATE 600+D PO  Take 1 tablet by mouth 1 day or 1 dose.     febuxostat 40 MG tablet  Commonly known as:  ULORIC  Take 40 mg by mouth daily.     ferrous sulfate 325 (65 FE) MG tablet  Take 1 tablet (325 mg total) by mouth 2 (two) times daily with a meal.     furosemide 20 MG tablet  Commonly known as:  LASIX  Take 1 tablet (20 mg total) by mouth 2 (two) times daily.     mometasone-formoterol 100-5 MCG/ACT Aero  Commonly known as:  DULERA  Take 2 puffs first thing in am and then another 2 puffs about 12 hours  later.     ondansetron 4 MG tablet  Commonly known as:  ZOFRAN  Take 1 tablet (4 mg total) by mouth every 6 (six) hours as needed for nausea.     oxycodone 5 MG capsule  Commonly known as:  OXY-IR  Take 1 capsule (5 mg total) by mouth every 4 (four) hours as needed for pain.     predniSONE 20 MG tablet  Commonly known as:  DELTASONE  Take 1 tablet (20 mg total) by mouth daily with breakfast. For 10 days for gout, then return to 10mg  daily thereafter for sarcoid (until assessed by Dr. Melvyn Novas)     senna-docusate 8.6-50 MG per tablet  Commonly known as:  Senokot-S  Take 1 tablet by mouth 2 (two) times daily.     spironolactone 25 MG tablet  Commonly known as:  ALDACTONE  Take 1 tablet (25 mg total) by mouth daily.        No orders of the defined types were placed in this encounter.    Immunization History  Administered Date(s) Administered  . Influenza,inj,Quad PF,36+ Mos 04/13/2014  . Pneumococcal Polysaccharide-23 04/13/2014  . Zoster 05/21/2014  History  Substance Use Topics  . Smoking status: Never Smoker   . Smokeless tobacco: Never Used  . Alcohol Use: No    Family history is noncontributory    Review of Systems  DATA OBTAINED: from patient GENERAL:  no fevers, fatigue, appetite changes SKIN: No itching, rash or wounds EYES: No eye pain, redness, discharge EARS: No earache, tinnitus, change in hearing NOSE: No congestion, drainage or bleeding  MOUTH/THROAT: No mouth or tooth pain, No sore throat RESPIRATORY: No cough, wheezing, SOB CARDIAC: No chest pain, palpitations, lower extremity edema  GI: No abdominal pain, No N/V/D or constipation, No heartburn or reflux  GU: No dysuria, frequency or urgency, or incontinence  MUSCULOSKELETAL: c/o semi-chronic R knee pain NEUROLOGIC: No headache, dizziness or focal weakness PSYCHIATRIC: No overt anxiety or sadness, No behavior issue.   Filed Vitals:   06/18/14 1325  BP: 134/86  Pulse: 20  Temp: 97.4 F (36.3  C)  Resp: 107    Physical Exam  GENERAL APPEARANCE: Alert, conversant,  No acute distress;morbidly obese BF  SKIN: No diaphoresis rash HEAD: Normocephalic, atraumatic  EYES: Conjunctiva/lids clear. Pupils round, reactive. EOMs intact.  EARS: External exam WNL, canals clear. Hearing grossly normal.  NOSE: No deformity or discharge.  MOUTH/THROAT: Lips w/o lesions  RESPIRATORY: Breathing is even, unlabored. Lung sounds are clear   CARDIOVASCULAR: Heart RRR no murmurs, rubs or gallops. No peripheral edema.   GASTROINTESTINAL: Abdomen is soft, non-tender, not distended w/ normal bowel sounds. GENITOURINARY: Bladder non tender, not distended  MUSCULOSKELETAL: No abnormal joints or musculature NEUROLOGIC:  Cranial nerves 2-12 grossly intact. Moves all extremities  PSYCHIATRIC: Mood and affect appropriate to situation, no behavioral issues  Patient Active Problem List   Diagnosis Date Noted  . Acute on chronic respiratory failure 06/24/2014  . CHF (congestive heart failure) 06/11/2014  . Anemia, iron deficiency 05/27/2014  . Acute gouty arthritis 05/25/2014  . Acute renal failure superimposed on stage 3 chronic kidney disease 05/25/2014  . Obstructive sleep apnea 05/25/2014  . Joint pain 05/24/2014  . Chronic diastolic CHF (congestive heart failure)   . Sarcoidosis   . COLD (chronic obstructive lung disease)   . Metabolic syndrome 58/52/7782  . Asthma, chronic 04/27/2014  . Anemia of chronic disease 04/19/2014  . Morbid obesity 04/18/2014  . On home oxygen therapy 04/18/2014  . Immunization due 04/18/2014  . Need for prophylactic vaccination and inoculation against influenza 04/18/2014  . Prediabetes 04/13/2014  . Essential hypertension 04/13/2014  . Lower extremity edema 04/13/2014  . History of lung cancer 04/13/2014    CBC    Component Value Date/Time   WBC 6.7 06/15/2014 0448   RBC 3.88 06/15/2014 0448   RBC 4.01 05/25/2014 0000   HGB 8.3* 06/15/2014 0448   HCT  26.4* 06/15/2014 0448   PLT 211 06/15/2014 0448   MCV 68.0* 06/15/2014 0448   LYMPHSABS 0.5* 06/11/2014 1929   MONOABS 0.6 06/11/2014 1929   EOSABS 0.0 06/11/2014 1929   BASOSABS 0.0 06/11/2014 1929    CMP     Component Value Date/Time   NA 137 06/15/2014 0448   K 4.4 06/15/2014 0448   CL 94* 06/15/2014 0448   CO2 32 06/15/2014 0448   GLUCOSE 85 06/15/2014 0448   BUN 33* 06/15/2014 0448   CREATININE 1.41* 06/15/2014 0448   CREATININE CANCELED 05/21/2014 1716   CALCIUM 8.8 06/15/2014 0448   PROT 6.6 06/11/2014 1929   ALBUMIN 2.5* 06/11/2014 1929   AST 32 06/11/2014 1929  ALT 28 06/11/2014 1929   ALKPHOS 298* 06/11/2014 1929   BILITOT 1.0 06/11/2014 1929   GFRNONAA 39* 06/15/2014 0448   GFRNONAA CANCELED 05/21/2014 1716   GFRAA 46* 06/15/2014 0448   GFRAA CANCELED 05/21/2014 1716    Assessment and Plan  Acute on chronic respiratory failure secondary to diastolic CHF imposed on OSA/OHS - EF 65-70% w/ Grade 1 diastolic dysfunction as of 05/27/14.  - BNP on admission 1193. CXR shows venous congestion.  - Placed on IV lasix on admission  - pt responding well with weight trending down since admission: 355 lbs --> 349 lbs --> 346 lbs and remains stable over the past 24 hours - transition to oral lasix upon discharge 20 mg PO BID  - Resumed aldactone - BP and respiratory status stable   Chronic diastolic CHF (congestive heart failure) secondary to diastolic CHF imposed on OSA/OHS - EF 65-70% w/ Grade 1 diastolic dysfunction as of 05/27/14.  - BNP on admission 1193. CXR shows venous congestion.  - Placed on IV lasix on admission  - pt responding well with weight trending down since admission: 355 lbs --> 349 lbs --> 346 lbs and remains stable over the past 24 hours - transition to oral lasix upon discharge 20 mg PO BID  - Resumed aldactone - BP and respiratory status stable   Acute gouty arthritis On prednisone taper, resume to chronic home dose in 5 days (10  mg daily for chronic sarcoidosis) -Pain control with prn percocet  Acute renal failure superimposed on stage 3 chronic kidney disease Baseline creatinine of 1.3 - Cr still elevated, I suspect will get better with changing Lasix to PO upon discharge  - continue to monitor in an outpatient setting   Obstructive sleep apnea On CPAP at night. Has a pressure setting of 15  Sarcoidosis On chronic low-dose prednisone 10 mg daily. Follow up with pulmonary as outpt  Essential hypertension Blood pressure stable. Monitor on lasix and aldactone  Morbid obesity - PT eval requested. recently discharged from SNF post hospitalization   History of lung cancer Status post radiation with CT chest results done 6 months back showing unchanged result.  - Follow as outpt  Metabolic syndrome E5U of 6.4.  Asthma, chronic Stable . Continue dulera. Follows with Dr Fuller [place], Noah Delaine, MD

## 2014-06-19 ENCOUNTER — Ambulatory Visit: Payer: Medicare Other | Admitting: Internal Medicine

## 2014-06-21 ENCOUNTER — Inpatient Hospital Stay: Payer: Medicare Other | Admitting: Internal Medicine

## 2014-06-22 ENCOUNTER — Ambulatory Visit: Payer: Medicare Other | Admitting: Dietician

## 2014-06-24 ENCOUNTER — Encounter: Payer: Self-pay | Admitting: Internal Medicine

## 2014-06-24 DIAGNOSIS — J9621 Acute and chronic respiratory failure with hypoxia: Secondary | ICD-10-CM | POA: Insufficient documentation

## 2014-06-24 DIAGNOSIS — J9620 Acute and chronic respiratory failure, unspecified whether with hypoxia or hypercapnia: Secondary | ICD-10-CM | POA: Insufficient documentation

## 2014-06-24 NOTE — Assessment & Plan Note (Signed)
On prednisone taper, resume to chronic home dose in 5 days (10 mg daily for chronic sarcoidosis) -Pain control with prn percocet

## 2014-06-24 NOTE — Assessment & Plan Note (Signed)
Status post radiation with CT chest results done 6 months back showing unchanged result.  - Follow as outpt

## 2014-06-24 NOTE — Assessment & Plan Note (Signed)
Stable . Continue dulera. Follows with Dr Melvyn Novas

## 2014-06-24 NOTE — Assessment & Plan Note (Signed)
A1c of 6.4.

## 2014-06-24 NOTE — Assessment & Plan Note (Signed)
-   PT eval requested. recently discharged from SNF post hospitalization

## 2014-06-24 NOTE — Assessment & Plan Note (Signed)
Baseline creatinine of 1.3 - Cr still elevated, I suspect will get better with changing Lasix to PO upon discharge  - continue to monitor in an outpatient setting

## 2014-06-24 NOTE — Assessment & Plan Note (Signed)
secondary to diastolic CHF imposed on OSA/OHS - EF 65-70% w/ Grade 1 diastolic dysfunction as of 05/27/14.  - BNP on admission 1193. CXR shows venous congestion.  - Placed on IV lasix on admission  - pt responding well with weight trending down since admission: 355 lbs --> 349 lbs --> 346 lbs and remains stable over the past 24 hours - transition to oral lasix upon discharge 20 mg PO BID  - Resumed aldactone - BP and respiratory status stable

## 2014-06-24 NOTE — Assessment & Plan Note (Signed)
On chronic low-dose prednisone 10 mg daily. Follow up with pulmonary as outpt

## 2014-06-24 NOTE — Assessment & Plan Note (Signed)
On CPAP at night. Has a pressure setting of 15

## 2014-06-24 NOTE — Assessment & Plan Note (Signed)
Blood pressure stable. Monitor on lasix and aldactone

## 2014-06-29 ENCOUNTER — Non-Acute Institutional Stay (SKILLED_NURSING_FACILITY): Payer: Medicare Other | Admitting: Nurse Practitioner

## 2014-06-29 ENCOUNTER — Ambulatory Visit (HOSPITAL_BASED_OUTPATIENT_CLINIC_OR_DEPARTMENT_OTHER): Payer: Medicare Other | Attending: Family Medicine

## 2014-06-29 DIAGNOSIS — I10 Essential (primary) hypertension: Secondary | ICD-10-CM

## 2014-06-29 DIAGNOSIS — D869 Sarcoidosis, unspecified: Secondary | ICD-10-CM | POA: Diagnosis not present

## 2014-06-29 DIAGNOSIS — I5032 Chronic diastolic (congestive) heart failure: Secondary | ICD-10-CM

## 2014-06-29 DIAGNOSIS — R600 Localized edema: Secondary | ICD-10-CM | POA: Diagnosis not present

## 2014-06-29 DIAGNOSIS — M109 Gout, unspecified: Secondary | ICD-10-CM | POA: Diagnosis not present

## 2014-06-29 NOTE — Progress Notes (Signed)
Patient ID: Pamela [name], female   DOB: 17-Jul-1951, 63 y.o.   MRN: [id number]    Nursing Home Location:  [place] of Service: SNF (31)  PCP: MATTHEWS,MICHELLE A., MD  Allergies  Allergen Reactions  . Sulfur Rash    Chief Complaint  Patient presents with  . Discharge Note    HPI:  Patient is a 63 y.o. female seen today at [facility] for discharge home. Pt with morbid obesity, OSA, CHF, and sarcoid who is admitted to SNF for generalized weakness after being hospitalized for acute on chronic CHF. Patient currently doing well with therapy and rehab stay has been uneventful,  now stable to discharge home with home health.  Review of Systems:  Review of Systems  Constitutional: Negative for activity change, appetite change, fatigue and unexpected weight change.  HENT: Negative for congestion and hearing loss.   Eyes: Negative.   Respiratory: Negative for cough and shortness of breath.   Cardiovascular: Positive for leg swelling (chronic). Negative for chest pain and palpitations.  Gastrointestinal: Negative for abdominal pain, diarrhea and constipation.  Genitourinary: Negative for dysuria and difficulty urinating.  Musculoskeletal: Positive for arthralgias (right knee pain ). Negative for myalgias.  Skin: Negative for color change and wound.  Neurological: Positive for weakness. Negative for dizziness.  Psychiatric/Behavioral: Negative for behavioral problems, confusion and agitation.    Past Medical History  Diagnosis Date  . CHF (congestive heart failure)   . Asthma   . COPD (chronic obstructive pulmonary disease)   . Arthritis   . Sarcoidosis   . Cancer     lung  . Hypertension   . Sleep apnea   . Oxygen deficiency    Past Surgical History  Procedure Laterality Date  . Lung biopsy    . Tubal ligation    . Vein ligation and stripping     Social History:   reports that she has never smoked. She has never used smokeless  tobacco. She reports that she does not drink alcohol or use illicit drugs.  Family History  Problem Relation Age of Onset  . Hypertension Mother   . Renal Disease Mother   . Cancer Father   . Heart disease Father   . Cancer Brother   . Diabetes Brother   . Cervical cancer Sister   . Diabetes Sister   . Multiple myeloma Sister     Medications: Patient's Medications  New Prescriptions   No medications on file  Previous Medications   ASPIRIN 81 MG TABLET    Take 81 mg by mouth daily.   CALCIUM CARBONATE-VITAMIN D (CALTRATE 600+D PO)    Take 1 tablet by mouth 1 day or 1 dose.   FEBUXOSTAT (ULORIC) 40 MG TABLET    Take 40 mg by mouth daily.   FERROUS SULFATE 325 (65 FE) MG TABLET    Take 1 tablet (325 mg total) by mouth 2 (two) times daily with a meal.   FUROSEMIDE (LASIX) 20 MG TABLET    Take 1 tablet (20 mg total) by mouth 2 (two) times daily.   MOMETASONE-FORMOTEROL (DULERA) 100-5 MCG/ACT AERO    Take 2 puffs first thing in am and then another 2 puffs about 12 hours later.   ONDANSETRON (ZOFRAN) 4 MG TABLET    Take 1 tablet (4 mg total) by mouth every 6 (six) hours as needed for nausea.   OXYCODONE (OXY-IR) 5 MG CAPSULE    Take 1 capsule (5 mg total)  by mouth every 4 (four) hours as needed for pain.   PREDNISONE (DELTASONE) 10 MG TABLET    Take 10 mg by mouth daily with breakfast.   SENNA-DOCUSATE (SENOKOT-S) 8.6-50 MG PER TABLET    Take 1 tablet by mouth 2 (two) times daily.   SPIRONOLACTONE (ALDACTONE) 25 MG TABLET    Take 1 tablet (25 mg total) by mouth daily.  Modified Medications   No medications on file  Discontinued Medications   PREDNISONE (DELTASONE) 20 MG TABLET    Take 1 tablet (20 mg total) by mouth daily with breakfast. For 10 days for gout, then return to 5m daily thereafter for sarcoid (until assessed by Dr. WMelvyn Novas     Physical Exam: Filed Vitals:   06/29/14 1050  BP: 101/62  Pulse: 62  Temp: 97 F (36.1 C)  Resp: 20  SpO2: 100%    Physical Exam    Constitutional: She is oriented to person, place, and time. She appears well-developed and well-nourished. No distress.  Cardiovascular: Normal rate, regular rhythm, normal heart sounds and intact distal pulses.   Pulmonary/Chest: Effort normal and breath sounds normal. She has no wheezes. She has no rales.  Wearing O2 at 2L  Abdominal: Soft. Bowel sounds are normal. She exhibits no distension and no mass. There is no tenderness.  Musculoskeletal: Normal range of motion. She exhibits edema.  Using Walker  Neurological: She is alert and oriented to person, place, and time.  Skin: Skin is warm and dry.    Labs reviewed: Basic Metabolic Panel:  Recent Labs  06/13/14 0525 06/14/14 0525 06/15/14 0448  NA 138 136* 137  K 4.4 4.6 4.4  CL 96 93* 94*  CO2 29 29 32  GLUCOSE 116* 99 85  BUN 28* 30* 33*  CREATININE 1.54* 1.35* 1.41*  CALCIUM 8.8 8.8 8.8   Liver Function Tests:  Recent Labs  04/13/14 1234 05/24/14 0030 06/11/14 1929  AST 23 20 32  ALT '26 9 28  ' ALKPHOS 240* 169* 298*  BILITOT 0.5 0.8 1.0  PROT 6.7 7.5 6.6  ALBUMIN 3.8 3.0* 2.5*   No results for input(s): LIPASE, AMYLASE in the last 8760 hours. No results for input(s): AMMONIA in the last 8760 hours. CBC:  Recent Labs  05/21/14 1716 05/24/14 0030  06/11/14 1929 06/12/14 0411 06/14/14 0525 06/15/14 0448  WBC 7.3 10.2  < > 9.9 7.9 6.1 6.7  NEUTROABS 4.6 8.0*  --  8.8*  --   --   --   HGB 10.2* 9.1*  < > 8.1* 8.1* 8.2* 8.3*  HCT 30.9* 28.4*  < > 25.0* 25.5* 26.5* 26.4*  MCV 65.7* 67.6*  < > 67.0* 68.4* 67.9* 68.0*  PLT 284 209  < > 178 169 186 211  < > = values in this interval not displayed. TSH:  Recent Labs  05/21/14 1729  TSH 3.934   A1C: Lab Results  Component Value Date   HGBA1C 6.4* 04/13/2014   Lipid Panel:  Recent Labs  04/13/14 1234  CHOL 171  HDL 59  LDLCALC 89  TRIG 116  CHOLHDL 2.9      Assessment/Plan 1. Essential hypertension -Patients blood pressure is stable;  continue current regimen.   2. Chronic diastolic CHF (congestive heart failure) -conts on lasix and aldactone, overall edema has improved heart failure stable will have HCucumberordered for ongoing education and medication management due to CHF   3. Bilateral edema of lower extremity Improved  4. Acute gouty arthritis -stable at  this time  5. Sarcoidosis -conts on prednisone 10 mg , to follow up with pulmonary   pt is stable for discharge-will need PT/OT/Nursing per home health. No DME needed. Rx written.  will need to follow up with cardiology, pulmonary and PCP within 2 weeks.

## 2014-07-02 DIAGNOSIS — D509 Iron deficiency anemia, unspecified: Secondary | ICD-10-CM | POA: Diagnosis not present

## 2014-07-02 DIAGNOSIS — I5030 Unspecified diastolic (congestive) heart failure: Secondary | ICD-10-CM | POA: Diagnosis not present

## 2014-07-02 DIAGNOSIS — M1000 Idiopathic gout, unspecified site: Secondary | ICD-10-CM | POA: Diagnosis not present

## 2014-07-02 DIAGNOSIS — I129 Hypertensive chronic kidney disease with stage 1 through stage 4 chronic kidney disease, or unspecified chronic kidney disease: Secondary | ICD-10-CM | POA: Diagnosis not present

## 2014-07-02 DIAGNOSIS — J449 Chronic obstructive pulmonary disease, unspecified: Secondary | ICD-10-CM | POA: Diagnosis not present

## 2014-07-02 DIAGNOSIS — C3490 Malignant neoplasm of unspecified part of unspecified bronchus or lung: Secondary | ICD-10-CM | POA: Diagnosis not present

## 2014-07-05 ENCOUNTER — Ambulatory Visit (INDEPENDENT_AMBULATORY_CARE_PROVIDER_SITE_OTHER): Payer: Medicare Other | Admitting: Family Medicine

## 2014-07-05 VITALS — BP 128/49 | HR 83 | Temp 98.3°F | Resp 16 | Ht 66.0 in | Wt 354.0 lb

## 2014-07-05 DIAGNOSIS — M109 Gout, unspecified: Secondary | ICD-10-CM

## 2014-07-05 DIAGNOSIS — N183 Chronic kidney disease, stage 3 unspecified: Secondary | ICD-10-CM

## 2014-07-05 DIAGNOSIS — D638 Anemia in other chronic diseases classified elsewhere: Secondary | ICD-10-CM | POA: Diagnosis not present

## 2014-07-05 DIAGNOSIS — M25561 Pain in right knee: Secondary | ICD-10-CM

## 2014-07-05 DIAGNOSIS — R7309 Other abnormal glucose: Secondary | ICD-10-CM

## 2014-07-05 DIAGNOSIS — G4733 Obstructive sleep apnea (adult) (pediatric): Secondary | ICD-10-CM

## 2014-07-05 DIAGNOSIS — Z85118 Personal history of other malignant neoplasm of bronchus and lung: Secondary | ICD-10-CM

## 2014-07-05 DIAGNOSIS — R7303 Prediabetes: Secondary | ICD-10-CM

## 2014-07-05 DIAGNOSIS — D869 Sarcoidosis, unspecified: Secondary | ICD-10-CM

## 2014-07-05 DIAGNOSIS — Z9981 Dependence on supplemental oxygen: Secondary | ICD-10-CM | POA: Diagnosis not present

## 2014-07-05 DIAGNOSIS — I10 Essential (primary) hypertension: Secondary | ICD-10-CM | POA: Diagnosis not present

## 2014-07-05 DIAGNOSIS — I5032 Chronic diastolic (congestive) heart failure: Secondary | ICD-10-CM

## 2014-07-05 DIAGNOSIS — D509 Iron deficiency anemia, unspecified: Secondary | ICD-10-CM

## 2014-07-05 DIAGNOSIS — N179 Acute kidney failure, unspecified: Secondary | ICD-10-CM

## 2014-07-05 DIAGNOSIS — M1009 Idiopathic gout, multiple sites: Secondary | ICD-10-CM

## 2014-07-05 MED ORDER — OXYCODONE HCL 5 MG PO CAPS
5.0000 mg | ORAL_CAPSULE | ORAL | Status: DC | PRN
Start: 2014-07-05 — End: 2014-10-03

## 2014-07-05 MED ORDER — FUROSEMIDE 20 MG PO TABS: 20.0000 mg | ORAL_TABLET | Freq: Two times a day (BID) | ORAL | Status: DC

## 2014-07-05 MED ORDER — PREDNISONE 10 MG PO TABS: 10.0000 mg | ORAL_TABLET | Freq: Every day | ORAL | Status: DC

## 2014-07-05 MED ORDER — SPIRONOLACTONE 25 MG PO TABS: 25.0000 mg | ORAL_TABLET | Freq: Every day | ORAL | Status: DC

## 2014-07-05 MED ORDER — FEBUXOSTAT 40 MG PO TABS: 40.0000 mg | ORAL_TABLET | Freq: Every day | ORAL | Status: DC

## 2014-07-05 MED ORDER — FERROUS SULFATE 325 (65 FE) MG PO TABS: 325.0000 mg | ORAL_TABLET | Freq: Two times a day (BID) | ORAL | Status: DC

## 2014-07-05 MED ORDER — SENNOSIDES-DOCUSATE SODIUM 8.6-50 MG PO TABS: 1.0000 | ORAL_TABLET | Freq: Two times a day (BID) | ORAL | Status: DC

## 2014-07-05 NOTE — Progress Notes (Signed)
Subjective:    Patient ID: Caylen Kuwahara, female    DOB: 1950/07/28, 63 y.o.   MRN: [id number]  HPI Ms. Corynne Massoud a patient with a complex medical history presents for follow up and medication re-fills after discharge from [facility]. Ms. Ure has a history of chronic diastolic CHF, OSA, HTN, CKD-III, left solitary kidney and sarcoidosis. Patient is currently doing well at home and states that she does not have complaints.   Past Medical History  Diagnosis Date  . CHF (congestive heart failure)   . Asthma   . COPD (chronic obstructive pulmonary disease)   . Arthritis   . Sarcoidosis   . Cancer     lung  . Hypertension   . Sleep apnea   . Oxygen deficiency     Review of Systems  Constitutional: Negative.  Negative for fever, fatigue and unexpected weight change.  HENT: Negative.   Eyes: Negative.   Respiratory: Positive for apnea (wears CPAP at night). Negative for wheezing.        Patient on continuous oxygen therapy  Cardiovascular: Positive for leg swelling. Negative for chest pain and palpitations.  Gastrointestinal: Negative.   Endocrine: Negative.  Negative for polydipsia, polyphagia and polyuria.  Genitourinary: Negative.   Musculoskeletal: Positive for arthralgias (occasional joint pain to knees bilaterally).  Skin: Negative.   Allergic/Immunologic: Negative.  Negative for food allergies and immunocompromised state.  Neurological: Negative.  Negative for dizziness, syncope, numbness and headaches.  Hematological: Negative.   Psychiatric/Behavioral: Negative.        Objective:   Physical Exam  Constitutional: She is oriented to person, place, and time. She appears well-developed and well-nourished.  Morbidly obese  HENT:  Head: Normocephalic and atraumatic.  Right Ear: External ear normal.  Mouth/Throat: Oropharynx is clear and moist. Abnormal dentition (partially edentulous).  Eyes: Conjunctivae are normal. Pupils are equal,  round, and reactive to light.  Neck: Normal range of motion. Neck supple.  Cardiovascular: Normal rate and regular rhythm.   No murmur heard. Pulses:      Carotid pulses are 2+ on the right side.      Radial pulses are 2+ on the right side.       Dorsalis pedis pulses are 2+ on the right side.       Posterior tibial pulses are 2+ on the right side.  Pulmonary/Chest: Effort normal and breath sounds normal.  Abdominal: Soft. Bowel sounds are normal.  Musculoskeletal: Normal range of motion.  Neurological: She is alert and oriented to person, place, and time.  Skin: Skin is warm and dry.  Hyperpigmentation, edematous, skin to lower extremities. "Leathery" to palpation.   Psychiatric: She has a normal mood and affect. Her behavior is normal. Judgment and thought content normal.         BP 128/49 mmHg  Pulse 83  Temp(Src) 98.3 F (36.8 C) (Oral)  Resp 16  Ht 5\' 6"  (1.676 m)  Wt 354 lb (160.573 kg)  BMI 57.16 kg/m2 Assessment & Plan:  1. Chronic diastolic CHF (congestive heart failure) EF is 65-70 %, indicative of Grade I diastolic dysfunction.  Ms. Romanello is currently not on an ace inhibitor. Her baseline creatinine is currently 1.4. I will start a 10 day trial of an ace inhibitor. I will check creatinine level after 10 days.  - Basic Metabolic Panel - furosemide (LASIX) 20 MG tablet; Take 1 tablet (20 mg total) by mouth 2 (two) times daily.  Dispense: 60 tablet; Refill: 2 -  spironolactone (ALDACTONE) 25 MG tablet; Take 1 tablet (25 mg total) by mouth daily.  Dispense: 30 tablet; Refill: 0  2. Obstructive sleep apnea On CPAP at night. Has a pressure setting of 15  3. On home oxygen therapy Patient is currently on 2 L oxygen therapy. Patient is on a continuous flow of oxygen.  She uses CPAP at night with a pressure setting of 15 per patient.    4. Acute renal failure superimposed on stage 3 chronic kidney disease Ms. Fayson has a solitary left kidney. Reviewed previous BMP,  creatinine level was  1.45 on , I will check current creatinine and GFR.   5. History of lung cancer Last chest CT stable  6. Sarcoidosis Ms. Luevanos is followed by Dr. Melvyn [facility]as at [facility]. She is on chronic low dose steroid therapy.  - predniSONE (DELTASONE) 10 MG tablet; Take 1 tablet (10 mg total) by mouth daily with breakfast.  Dispense: 30 tablet; Refill: 2 7. Essential hypertension Blood pressure controlled on current medication regimen  8. Gouty arthritis - febuxostat (ULORIC) 40 MG tablet; Take 1 tablet (40 mg total) by mouth daily.  Dispense: 30 tablet; Refill: 2  9. Knee pain, right anterior Patient is currently unable to take NSAIDs due to solitary left kidney. Pain is primarily confined to right knee. She states that pain is controlled on Oxycodone 5 mg.  - oxycodone (OXY-IR) 5 MG capsule; Take 1 capsule (5 mg total) by mouth every 4 (four) hours as needed for pain.  Dispense: 60 capsule; Refill: 0  10. Prediabetes Previous A1C of 6.4 in September. I will re-check a1C - Hemoglobin A1c  11.Anemia, iron deficiency  - ferrous sulfate 325 (65 FE) MG tablet; Take 1 tablet (325 mg total) by mouth 2 (two) times daily with a meal.  Dispense: 60 tablet; Refill: 2 - CBC with Differential  12. Morbid obesity Patient has decreased weight. Continue carbohydrate modified diet.

## 2014-07-06 DIAGNOSIS — I129 Hypertensive chronic kidney disease with stage 1 through stage 4 chronic kidney disease, or unspecified chronic kidney disease: Secondary | ICD-10-CM | POA: Diagnosis not present

## 2014-07-06 DIAGNOSIS — D509 Iron deficiency anemia, unspecified: Secondary | ICD-10-CM | POA: Diagnosis not present

## 2014-07-06 DIAGNOSIS — I5030 Unspecified diastolic (congestive) heart failure: Secondary | ICD-10-CM | POA: Diagnosis not present

## 2014-07-06 DIAGNOSIS — C3490 Malignant neoplasm of unspecified part of unspecified bronchus or lung: Secondary | ICD-10-CM | POA: Diagnosis not present

## 2014-07-06 DIAGNOSIS — J449 Chronic obstructive pulmonary disease, unspecified: Secondary | ICD-10-CM | POA: Diagnosis not present

## 2014-07-06 DIAGNOSIS — M1000 Idiopathic gout, unspecified site: Secondary | ICD-10-CM | POA: Diagnosis not present

## 2014-07-06 LAB — CBC WITH DIFFERENTIAL/PLATELET
BASOS ABS: 0 10*3/uL (ref 0.0–0.1)
BASOS PCT: 0 % (ref 0–1)
EOS ABS: 0.1 10*3/uL (ref 0.0–0.7)
Eosinophils Relative: 1 % (ref 0–5)
HCT: 28.3 % — ABNORMAL LOW (ref 36.0–46.0)
Hemoglobin: 9.2 g/dL — ABNORMAL LOW (ref 12.0–15.0)
LYMPHS PCT: 9 % — AB (ref 12–46)
Lymphs Abs: 0.6 10*3/uL — ABNORMAL LOW (ref 0.7–4.0)
MCH: 21.3 pg — AB (ref 26.0–34.0)
MCHC: 32.5 g/dL (ref 30.0–36.0)
MCV: 65.5 fL — ABNORMAL LOW (ref 78.0–100.0)
MONO ABS: 0.4 10*3/uL (ref 0.1–1.0)
MPV: 9.9 fL (ref 9.4–12.4)
Monocytes Relative: 5 % (ref 3–12)
Neutro Abs: 6 10*3/uL (ref 1.7–7.7)
Neutrophils Relative %: 85 % — ABNORMAL HIGH (ref 43–77)
PLATELETS: 206 10*3/uL (ref 150–400)
RBC: 4.32 MIL/uL (ref 3.87–5.11)
RDW: 16.1 % — AB (ref 11.5–15.5)
WBC: 7 10*3/uL (ref 4.0–10.5)

## 2014-07-06 LAB — BASIC METABOLIC PANEL
BUN: 35 mg/dL — AB (ref 6–23)
CHLORIDE: 101 meq/L (ref 96–112)
CO2: 24 mEq/L (ref 19–32)
Calcium: 8.5 mg/dL (ref 8.4–10.5)
Creat: 1.6 mg/dL — ABNORMAL HIGH (ref 0.50–1.10)
GLUCOSE: 115 mg/dL — AB (ref 70–99)
Potassium: 4 mEq/L (ref 3.5–5.3)
Sodium: 140 mEq/L (ref 135–145)

## 2014-07-06 LAB — HEMOGLOBIN A1C
Hgb A1c MFr Bld: 5.9 % — ABNORMAL HIGH (ref <5.7)
Mean Plasma Glucose: 123 mg/dL — ABNORMAL HIGH (ref <117)

## 2014-07-09 ENCOUNTER — Encounter: Payer: Self-pay | Admitting: Family Medicine

## 2014-07-09 DIAGNOSIS — I5030 Unspecified diastolic (congestive) heart failure: Secondary | ICD-10-CM | POA: Diagnosis not present

## 2014-07-09 DIAGNOSIS — J449 Chronic obstructive pulmonary disease, unspecified: Secondary | ICD-10-CM | POA: Diagnosis not present

## 2014-07-09 DIAGNOSIS — M1000 Idiopathic gout, unspecified site: Secondary | ICD-10-CM | POA: Diagnosis not present

## 2014-07-09 DIAGNOSIS — I129 Hypertensive chronic kidney disease with stage 1 through stage 4 chronic kidney disease, or unspecified chronic kidney disease: Secondary | ICD-10-CM | POA: Diagnosis not present

## 2014-07-09 DIAGNOSIS — C3490 Malignant neoplasm of unspecified part of unspecified bronchus or lung: Secondary | ICD-10-CM | POA: Diagnosis not present

## 2014-07-09 DIAGNOSIS — D509 Iron deficiency anemia, unspecified: Secondary | ICD-10-CM | POA: Diagnosis not present

## 2014-07-09 MED ORDER — LISINOPRIL 2.5 MG PO TABS: 2.5000 mg | ORAL_TABLET | Freq: Every day | ORAL | Status: DC

## 2014-07-10 DIAGNOSIS — M1000 Idiopathic gout, unspecified site: Secondary | ICD-10-CM | POA: Diagnosis not present

## 2014-07-10 DIAGNOSIS — D509 Iron deficiency anemia, unspecified: Secondary | ICD-10-CM | POA: Diagnosis not present

## 2014-07-10 DIAGNOSIS — C3490 Malignant neoplasm of unspecified part of unspecified bronchus or lung: Secondary | ICD-10-CM | POA: Diagnosis not present

## 2014-07-10 DIAGNOSIS — I129 Hypertensive chronic kidney disease with stage 1 through stage 4 chronic kidney disease, or unspecified chronic kidney disease: Secondary | ICD-10-CM | POA: Diagnosis not present

## 2014-07-10 DIAGNOSIS — I5030 Unspecified diastolic (congestive) heart failure: Secondary | ICD-10-CM | POA: Diagnosis not present

## 2014-07-10 DIAGNOSIS — J449 Chronic obstructive pulmonary disease, unspecified: Secondary | ICD-10-CM | POA: Diagnosis not present

## 2014-07-11 DIAGNOSIS — D509 Iron deficiency anemia, unspecified: Secondary | ICD-10-CM | POA: Diagnosis not present

## 2014-07-11 DIAGNOSIS — C3490 Malignant neoplasm of unspecified part of unspecified bronchus or lung: Secondary | ICD-10-CM | POA: Diagnosis not present

## 2014-07-11 DIAGNOSIS — M1000 Idiopathic gout, unspecified site: Secondary | ICD-10-CM | POA: Diagnosis not present

## 2014-07-11 DIAGNOSIS — J449 Chronic obstructive pulmonary disease, unspecified: Secondary | ICD-10-CM | POA: Diagnosis not present

## 2014-07-11 DIAGNOSIS — I129 Hypertensive chronic kidney disease with stage 1 through stage 4 chronic kidney disease, or unspecified chronic kidney disease: Secondary | ICD-10-CM | POA: Diagnosis not present

## 2014-07-11 DIAGNOSIS — I5030 Unspecified diastolic (congestive) heart failure: Secondary | ICD-10-CM | POA: Diagnosis not present

## 2014-07-12 DIAGNOSIS — J449 Chronic obstructive pulmonary disease, unspecified: Secondary | ICD-10-CM | POA: Diagnosis not present

## 2014-07-12 DIAGNOSIS — C3490 Malignant neoplasm of unspecified part of unspecified bronchus or lung: Secondary | ICD-10-CM | POA: Diagnosis not present

## 2014-07-12 DIAGNOSIS — I5030 Unspecified diastolic (congestive) heart failure: Secondary | ICD-10-CM | POA: Diagnosis not present

## 2014-07-12 DIAGNOSIS — I129 Hypertensive chronic kidney disease with stage 1 through stage 4 chronic kidney disease, or unspecified chronic kidney disease: Secondary | ICD-10-CM | POA: Diagnosis not present

## 2014-07-12 DIAGNOSIS — D509 Iron deficiency anemia, unspecified: Secondary | ICD-10-CM | POA: Diagnosis not present

## 2014-07-12 DIAGNOSIS — M1000 Idiopathic gout, unspecified site: Secondary | ICD-10-CM | POA: Diagnosis not present

## 2014-07-16 ENCOUNTER — Ambulatory Visit: Payer: Medicare Other | Admitting: Internal Medicine

## 2014-07-16 DIAGNOSIS — C3490 Malignant neoplasm of unspecified part of unspecified bronchus or lung: Secondary | ICD-10-CM | POA: Diagnosis not present

## 2014-07-16 DIAGNOSIS — D509 Iron deficiency anemia, unspecified: Secondary | ICD-10-CM | POA: Diagnosis not present

## 2014-07-16 DIAGNOSIS — M1000 Idiopathic gout, unspecified site: Secondary | ICD-10-CM | POA: Diagnosis not present

## 2014-07-16 DIAGNOSIS — I5030 Unspecified diastolic (congestive) heart failure: Secondary | ICD-10-CM | POA: Diagnosis not present

## 2014-07-16 DIAGNOSIS — J449 Chronic obstructive pulmonary disease, unspecified: Secondary | ICD-10-CM | POA: Diagnosis not present

## 2014-07-16 DIAGNOSIS — I129 Hypertensive chronic kidney disease with stage 1 through stage 4 chronic kidney disease, or unspecified chronic kidney disease: Secondary | ICD-10-CM | POA: Diagnosis not present

## 2014-07-17 ENCOUNTER — Other Ambulatory Visit: Payer: Self-pay | Admitting: Family Medicine

## 2014-07-17 ENCOUNTER — Telehealth: Payer: Self-pay

## 2014-07-17 DIAGNOSIS — I5032 Chronic diastolic (congestive) heart failure: Secondary | ICD-10-CM

## 2014-07-17 DIAGNOSIS — I129 Hypertensive chronic kidney disease with stage 1 through stage 4 chronic kidney disease, or unspecified chronic kidney disease: Secondary | ICD-10-CM | POA: Diagnosis not present

## 2014-07-17 DIAGNOSIS — D509 Iron deficiency anemia, unspecified: Secondary | ICD-10-CM | POA: Diagnosis not present

## 2014-07-17 DIAGNOSIS — M1000 Idiopathic gout, unspecified site: Secondary | ICD-10-CM | POA: Diagnosis not present

## 2014-07-17 DIAGNOSIS — C3490 Malignant neoplasm of unspecified part of unspecified bronchus or lung: Secondary | ICD-10-CM | POA: Diagnosis not present

## 2014-07-17 DIAGNOSIS — J449 Chronic obstructive pulmonary disease, unspecified: Secondary | ICD-10-CM | POA: Diagnosis not present

## 2014-07-17 DIAGNOSIS — I5030 Unspecified diastolic (congestive) heart failure: Secondary | ICD-10-CM | POA: Diagnosis not present

## 2014-07-17 NOTE — Telephone Encounter (Signed)
Spoke with Loraine with "[facility]" [phone]. They are her home health provider and will draw the BMP at her visit today. Thanks!

## 2014-07-17 NOTE — Progress Notes (Signed)
Pamela [name] was started on a 10 day trial of Lisinopril on 07/09/2014 for chronic diastolic heart failure. I will check BMP on 07/19/2014.   Dorena Dew, FNP

## 2014-07-18 DIAGNOSIS — M1000 Idiopathic gout, unspecified site: Secondary | ICD-10-CM | POA: Diagnosis not present

## 2014-07-18 DIAGNOSIS — I129 Hypertensive chronic kidney disease with stage 1 through stage 4 chronic kidney disease, or unspecified chronic kidney disease: Secondary | ICD-10-CM | POA: Diagnosis not present

## 2014-07-18 DIAGNOSIS — I5030 Unspecified diastolic (congestive) heart failure: Secondary | ICD-10-CM | POA: Diagnosis not present

## 2014-07-18 DIAGNOSIS — C3490 Malignant neoplasm of unspecified part of unspecified bronchus or lung: Secondary | ICD-10-CM | POA: Diagnosis not present

## 2014-07-18 DIAGNOSIS — D509 Iron deficiency anemia, unspecified: Secondary | ICD-10-CM | POA: Diagnosis not present

## 2014-07-18 DIAGNOSIS — J449 Chronic obstructive pulmonary disease, unspecified: Secondary | ICD-10-CM | POA: Diagnosis not present

## 2014-07-19 ENCOUNTER — Encounter: Payer: Self-pay | Admitting: Internal Medicine

## 2014-07-19 ENCOUNTER — Ambulatory Visit (INDEPENDENT_AMBULATORY_CARE_PROVIDER_SITE_OTHER): Payer: Medicare Other | Admitting: Internal Medicine

## 2014-07-19 ENCOUNTER — Ambulatory Visit (INDEPENDENT_AMBULATORY_CARE_PROVIDER_SITE_OTHER)
Admission: RE | Admit: 2014-07-19 | Discharge: 2014-07-19 | Disposition: A | Discharge status: Home / Self Care | Payer: Medicare Other | Source: Ambulatory Visit | Attending: Internal Medicine | Admitting: Internal Medicine

## 2014-07-19 VITALS — BP 126/74 | HR 70 | Ht 65.0 in | Wt 349.0 lb

## 2014-07-19 DIAGNOSIS — J9611 Chronic respiratory failure with hypoxia: Secondary | ICD-10-CM | POA: Diagnosis not present

## 2014-07-19 DIAGNOSIS — J4530 Mild persistent asthma, uncomplicated: Secondary | ICD-10-CM

## 2014-07-19 DIAGNOSIS — R918 Other nonspecific abnormal finding of lung field: Secondary | ICD-10-CM | POA: Diagnosis not present

## 2014-07-19 DIAGNOSIS — I5030 Unspecified diastolic (congestive) heart failure: Secondary | ICD-10-CM | POA: Diagnosis not present

## 2014-07-19 DIAGNOSIS — I129 Hypertensive chronic kidney disease with stage 1 through stage 4 chronic kidney disease, or unspecified chronic kidney disease: Secondary | ICD-10-CM | POA: Diagnosis not present

## 2014-07-19 DIAGNOSIS — J9612 Chronic respiratory failure with hypercapnia: Secondary | ICD-10-CM

## 2014-07-19 DIAGNOSIS — D509 Iron deficiency anemia, unspecified: Secondary | ICD-10-CM | POA: Diagnosis not present

## 2014-07-19 DIAGNOSIS — C3490 Malignant neoplasm of unspecified part of unspecified bronchus or lung: Secondary | ICD-10-CM | POA: Diagnosis not present

## 2014-07-19 DIAGNOSIS — M1000 Idiopathic gout, unspecified site: Secondary | ICD-10-CM | POA: Diagnosis not present

## 2014-07-19 DIAGNOSIS — J45909 Unspecified asthma, uncomplicated: Secondary | ICD-10-CM

## 2014-07-19 DIAGNOSIS — J449 Chronic obstructive pulmonary disease, unspecified: Secondary | ICD-10-CM | POA: Diagnosis not present

## 2014-07-19 LAB — PULMONARY FUNCTION TEST
DL/VA % PRED: 101 %
DL/VA: 5 ml/min/mmHg/L
DLCO unc % pred: 71 %
DLCO unc: 18.43 ml/min/mmHg
FEF 25-75 POST: 2.23 L/s
FEF 25-75 PRE: 1.16 L/s
FEF2575-%CHANGE-POST: 92 %
FEF2575-%PRED-PRE: 55 %
FEF2575-%Pred-Post: 107 %
FEV1-%Change-Post: 21 %
FEV1-%Pred-Post: 93 %
FEV1-%Pred-Pre: 76 %
FEV1-Post: 2 L
FEV1-Pre: 1.64 L
FEV1FVC-%Change-Post: 19 %
FEV1FVC-%PRED-PRE: 89 %
FEV6-%Change-Post: 1 %
FEV6-%Pred-Post: 89 %
FEV6-%Pred-Pre: 88 %
FEV6-PRE: 2.32 L
FEV6-Post: 2.36 L
FEV6FVC-%PRED-POST: 104 %
FEV6FVC-%Pred-Pre: 104 %
FVC-%Change-Post: 1 %
FVC-%PRED-POST: 86 %
FVC-%Pred-Pre: 85 %
FVC-Post: 2.36 L
FVC-Pre: 2.32 L
POST FEV1/FVC RATIO: 85 %
PRE FEV1/FVC RATIO: 71 %
Post FEV6/FVC ratio: 100 %
Pre FEV6/FVC Ratio: 100 %

## 2014-07-19 NOTE — Assessment & Plan Note (Addendum)
See pfts 07/19/2014 with completely reversible airflow obst (so this is not copd) and a dlco of 71% so this is not ILD  She is not hypercarbic by her latest bmet and well compensated on 02 but likely until she loses sign wt will have enough v/q mismatching in bases to remeain 02 dep indefinitely

## 2014-07-19 NOTE — Progress Notes (Signed)
Subjective:    Patient ID: Pamela [name], female    DOB: [birth year]  MRN: [id number]   Brief patient profile:  59 yobf never smoker with 1st asthma attack in 1990s and on maint rx since late 90's maint rx and freq courses of prednisone 3-4 x per year despite advair and spiriva and freq saba referred to pulmonary clinic 04/27/2014 by Dr [name]    History of Present Illness  04/27/2014 1st [facility] office visit/ [name]   Chief Complaint  Patient presents with  . Pulmonary Consult    Referred by Dr. [name]. [name]  Pt recently moved to [place] from [name] and states needs to establish with new pulmonary MD.  She c/o DOE and cough, and states that she feels these symptoms are currently under control.    on prednisone 10 mg daily  since late July 2015 and still on freq saba and 2lpm  Already used   2puffs proair am of ov  rec Stop spiriva and advair Start dulera 100 Take 2 puffs first thing in am and then another 2 puffs about 12 hours later.  Work on inhaler technique:  relax and gently blow all the way out then take a nice smooth deep breath back in, triggering the inhaler at same time you start breathing in.  Hold for up to 5 seconds if you can.  Rinse and gargle with water when done Only use your albuterol (proair) as a rescue medication  If proair not helping, then use the neb and if needing the neb more than occastional, then take prednisone 10 mg daily  Please schedule a follow up office visit in 4 weeks, sooner if needed with all inhalers in hand for pfts on return   07/19/2014 f/u ov/[name] re: no inhalers x one week / on ACEi / did not bring inhalers, did not use this am Chief Complaint  Patient presents with  . Follow-up    Pt states that her breathing is doing well and denies any new co's today. PFT was done today.  Not limited by breathing from desired activities  But by wt and couldn't tell any change on dulera/  out x one week and not using saba either   No obvious day to day or daytime variabilty or assoc chronic cough or cp or chest tightness, subjective wheeze overt sinus or hb symptoms. No unusual exp hx or h/o childhood pna/ asthma or knowledge of premature birth.  Sleeping ok without nocturnal  or early am exacerbation  of respiratory  c/o's or need for noct saba. Also denies any obvious fluctuation of symptoms with weather or environmental changes or other aggravating or alleviating factors except as outlined above   Current Medications, Allergies, Complete Past Medical History, Past Surgical History, Family History, and Social History were reviewed in [employer] record.  ROS  The following are not active complaints unless bolded sore throat, dysphagia, dental problems, itching, sneezing,  nasal congestion or excess/ purulent secretions, ear ache,   fever, chills, sweats, unintended wt loss, pleuritic or exertional cp, hemoptysis,  orthopnea pnd or leg swelling, presyncope, palpitations, heartburn, abdominal pain, anorexia, nausea, vomiting, diarrhea  or change in bowel or urinary habits, change in stools or urine, dysuria,hematuria,  rash, arthralgias, visual complaints, headache, numbness weakness or ataxia or problems with walking or coordination,  change in mood/affect or memory.  Objective:   Physical Exam  amb massively obese  Bf does not want to get up on table due to wt    Wt Readings from Last 3 Encounters:  07/19/14 349 lb (158.305 kg)  07/05/14 354 lb (160.573 kg)  06/14/14 346 lb 11.2 oz (157.262 kg)    Vital signs reviewed      HEENT: nl dentition, turbinates, and orophanx. Nl external ear canals without cough reflex   NECK :  without JVD/Nodes/TM/ nl carotid upstrokes bilaterally   LUNGS: no acc muscle use, clear to A and P bilaterally without cough on insp or exp maneuvers   CV:  RRR  no s3 or murmur or increase in P2    ABD:  soft  and nontender with limited excursion.    MS:  warm without deformities, calf tenderness, cyanosis or clubbing  SKIN: warm and dry without lesions    Ext: wrapped/ bilateral sym swelling         CXR PA and Lateral:   07/19/2014 :  Improvement in linear opacity in the right upper lobe anteriorly most consistent with linear atelectasis.      Assessment & Plan:

## 2014-07-19 NOTE — Patient Instructions (Signed)
Only use your albuterol as a rescue medication to be used if you can't catch your breath by resting or doing a relaxed purse lip breathing pattern.  - The less you use it, the better it will work when you need it. - Ok to use up to 2 puffs  every 4 hours if you must but call for immediate appointment if use goes up over your usual need - Don't leave home without it !!  (think of it like the spare tire for your car)   If you start needing your rescue inhaler more than twice weekly for cough/wheeze/ short of breath the first step will be to get a substitute for lisinopril and return here if not better to restart dulera 100 Take 2 puffs first thing in am and then another 2 puffs about 12 hours later.   Please remember to go to the   x-ray department downstairs for your tests - we will call you with the results when they are available.

## 2014-07-19 NOTE — Progress Notes (Signed)
PFT done today. 

## 2014-07-19 NOTE — Assessment & Plan Note (Addendum)
pfts 07/31/13  FEV1  1.53 (58%) with ratio 66 > p saba ratio 74 FEV1 1.68 (64%)  - -04/27/2014 p extensive coaching HFA effectiveness =    75% > try dulera 100 2 bid  - PFT's 07/19/2014 FEV1 2.00 (93%) pand ratio 85 after 21% improvement from saba with no inhalers x one week   So her asthma is relatively mild but definitely present and at this point asymptomatic and it's hard to convince her to take maint rx  when she has no chronic symptoms or recent flares and says she's usually on prednisone for gout anyway which certainly is an over treatment of airways dz so she can't perceive benefit from topical therapy (but might if prednisone is tapered down/ off at some point)   dulera perfect choice as she can actually start it immediately if symptomatic as long as she remembers and uses the rule of 2s    Each maintenance medication was reviewed in detail including most importantly the difference between maintenance and as needed and under what circumstances the prns are to be used.  Please see instructions for details which were reviewed in writing and the patient given a copy.    The proper method of use, as well as anticipated side effects, of a metered-dose inhaler are discussed and demonstrated to the patient. Improved effectiveness after extensive coaching during this visit to a level of approximately  90%   Pulmonary f/u can be prn

## 2014-07-23 DIAGNOSIS — I129 Hypertensive chronic kidney disease with stage 1 through stage 4 chronic kidney disease, or unspecified chronic kidney disease: Secondary | ICD-10-CM | POA: Diagnosis not present

## 2014-07-23 DIAGNOSIS — C3490 Malignant neoplasm of unspecified part of unspecified bronchus or lung: Secondary | ICD-10-CM | POA: Diagnosis not present

## 2014-07-23 DIAGNOSIS — M1000 Idiopathic gout, unspecified site: Secondary | ICD-10-CM | POA: Diagnosis not present

## 2014-07-23 DIAGNOSIS — J449 Chronic obstructive pulmonary disease, unspecified: Secondary | ICD-10-CM | POA: Diagnosis not present

## 2014-07-23 DIAGNOSIS — D509 Iron deficiency anemia, unspecified: Secondary | ICD-10-CM | POA: Diagnosis not present

## 2014-07-23 DIAGNOSIS — I5030 Unspecified diastolic (congestive) heart failure: Secondary | ICD-10-CM | POA: Diagnosis not present

## 2014-07-25 DIAGNOSIS — M1000 Idiopathic gout, unspecified site: Secondary | ICD-10-CM | POA: Diagnosis not present

## 2014-07-25 DIAGNOSIS — C3490 Malignant neoplasm of unspecified part of unspecified bronchus or lung: Secondary | ICD-10-CM | POA: Diagnosis not present

## 2014-07-25 DIAGNOSIS — J449 Chronic obstructive pulmonary disease, unspecified: Secondary | ICD-10-CM | POA: Diagnosis not present

## 2014-07-25 DIAGNOSIS — I129 Hypertensive chronic kidney disease with stage 1 through stage 4 chronic kidney disease, or unspecified chronic kidney disease: Secondary | ICD-10-CM | POA: Diagnosis not present

## 2014-07-25 DIAGNOSIS — D509 Iron deficiency anemia, unspecified: Secondary | ICD-10-CM | POA: Diagnosis not present

## 2014-07-25 DIAGNOSIS — I5030 Unspecified diastolic (congestive) heart failure: Secondary | ICD-10-CM | POA: Diagnosis not present

## 2014-07-27 DIAGNOSIS — I129 Hypertensive chronic kidney disease with stage 1 through stage 4 chronic kidney disease, or unspecified chronic kidney disease: Secondary | ICD-10-CM | POA: Diagnosis not present

## 2014-07-27 DIAGNOSIS — C3490 Malignant neoplasm of unspecified part of unspecified bronchus or lung: Secondary | ICD-10-CM | POA: Diagnosis not present

## 2014-07-27 DIAGNOSIS — M1000 Idiopathic gout, unspecified site: Secondary | ICD-10-CM | POA: Diagnosis not present

## 2014-07-27 DIAGNOSIS — D509 Iron deficiency anemia, unspecified: Secondary | ICD-10-CM | POA: Diagnosis not present

## 2014-07-27 DIAGNOSIS — I5030 Unspecified diastolic (congestive) heart failure: Secondary | ICD-10-CM | POA: Diagnosis not present

## 2014-07-27 DIAGNOSIS — J449 Chronic obstructive pulmonary disease, unspecified: Secondary | ICD-10-CM | POA: Diagnosis not present

## 2014-07-30 DIAGNOSIS — I129 Hypertensive chronic kidney disease with stage 1 through stage 4 chronic kidney disease, or unspecified chronic kidney disease: Secondary | ICD-10-CM | POA: Diagnosis not present

## 2014-07-30 DIAGNOSIS — I5030 Unspecified diastolic (congestive) heart failure: Secondary | ICD-10-CM | POA: Diagnosis not present

## 2014-07-30 DIAGNOSIS — D509 Iron deficiency anemia, unspecified: Secondary | ICD-10-CM | POA: Diagnosis not present

## 2014-07-30 DIAGNOSIS — C3490 Malignant neoplasm of unspecified part of unspecified bronchus or lung: Secondary | ICD-10-CM | POA: Diagnosis not present

## 2014-07-30 DIAGNOSIS — M1000 Idiopathic gout, unspecified site: Secondary | ICD-10-CM | POA: Diagnosis not present

## 2014-07-30 DIAGNOSIS — J449 Chronic obstructive pulmonary disease, unspecified: Secondary | ICD-10-CM | POA: Diagnosis not present

## 2014-07-31 ENCOUNTER — Ambulatory Visit (HOSPITAL_BASED_OUTPATIENT_CLINIC_OR_DEPARTMENT_OTHER): Payer: Medicare Other | Attending: Family Medicine | Admitting: Radiology

## 2014-07-31 VITALS — Ht 66.0 in | Wt 349.0 lb

## 2014-07-31 DIAGNOSIS — I5030 Unspecified diastolic (congestive) heart failure: Secondary | ICD-10-CM | POA: Diagnosis not present

## 2014-07-31 DIAGNOSIS — Z87898 Personal history of other specified conditions: Secondary | ICD-10-CM | POA: Insufficient documentation

## 2014-07-31 DIAGNOSIS — Z8669 Personal history of other diseases of the nervous system and sense organs: Secondary | ICD-10-CM

## 2014-07-31 DIAGNOSIS — G4733 Obstructive sleep apnea (adult) (pediatric): Secondary | ICD-10-CM | POA: Insufficient documentation

## 2014-07-31 DIAGNOSIS — I129 Hypertensive chronic kidney disease with stage 1 through stage 4 chronic kidney disease, or unspecified chronic kidney disease: Secondary | ICD-10-CM | POA: Diagnosis not present

## 2014-07-31 DIAGNOSIS — D509 Iron deficiency anemia, unspecified: Secondary | ICD-10-CM | POA: Diagnosis not present

## 2014-07-31 DIAGNOSIS — M1000 Idiopathic gout, unspecified site: Secondary | ICD-10-CM | POA: Diagnosis not present

## 2014-07-31 DIAGNOSIS — C3490 Malignant neoplasm of unspecified part of unspecified bronchus or lung: Secondary | ICD-10-CM | POA: Diagnosis not present

## 2014-07-31 DIAGNOSIS — J449 Chronic obstructive pulmonary disease, unspecified: Secondary | ICD-10-CM | POA: Diagnosis not present

## 2014-08-02 DIAGNOSIS — I129 Hypertensive chronic kidney disease with stage 1 through stage 4 chronic kidney disease, or unspecified chronic kidney disease: Secondary | ICD-10-CM | POA: Diagnosis not present

## 2014-08-02 DIAGNOSIS — I5030 Unspecified diastolic (congestive) heart failure: Secondary | ICD-10-CM | POA: Diagnosis not present

## 2014-08-02 DIAGNOSIS — C3490 Malignant neoplasm of unspecified part of unspecified bronchus or lung: Secondary | ICD-10-CM | POA: Diagnosis not present

## 2014-08-02 DIAGNOSIS — M1000 Idiopathic gout, unspecified site: Secondary | ICD-10-CM | POA: Diagnosis not present

## 2014-08-02 DIAGNOSIS — D509 Iron deficiency anemia, unspecified: Secondary | ICD-10-CM | POA: Diagnosis not present

## 2014-08-02 DIAGNOSIS — J449 Chronic obstructive pulmonary disease, unspecified: Secondary | ICD-10-CM | POA: Diagnosis not present

## 2014-08-03 DIAGNOSIS — J449 Chronic obstructive pulmonary disease, unspecified: Secondary | ICD-10-CM | POA: Diagnosis not present

## 2014-08-03 DIAGNOSIS — C3490 Malignant neoplasm of unspecified part of unspecified bronchus or lung: Secondary | ICD-10-CM | POA: Diagnosis not present

## 2014-08-03 DIAGNOSIS — I129 Hypertensive chronic kidney disease with stage 1 through stage 4 chronic kidney disease, or unspecified chronic kidney disease: Secondary | ICD-10-CM | POA: Diagnosis not present

## 2014-08-03 DIAGNOSIS — D509 Iron deficiency anemia, unspecified: Secondary | ICD-10-CM | POA: Diagnosis not present

## 2014-08-03 DIAGNOSIS — M1000 Idiopathic gout, unspecified site: Secondary | ICD-10-CM | POA: Diagnosis not present

## 2014-08-03 DIAGNOSIS — I5030 Unspecified diastolic (congestive) heart failure: Secondary | ICD-10-CM | POA: Diagnosis not present

## 2014-08-04 DIAGNOSIS — G4733 Obstructive sleep apnea (adult) (pediatric): Secondary | ICD-10-CM

## 2014-08-04 DIAGNOSIS — Z87898 Personal history of other specified conditions: Secondary | ICD-10-CM

## 2014-08-04 NOTE — Sleep Study (Signed)
   NAME: Pamela [name] DATE OF BIRTH:  1951/04/04 MEDICAL RECORD NUMBER [id number]  LOCATION: [facility]  PHYSICIAN: YOUNG,CLINTON D  DATE OF STUDY: 07/31/2014  SLEEP STUDY TYPE: Nocturnal Polysomnogram               REFERRING PHYSICIAN: Dorena Dew, FNP  INDICATION FOR STUDY: Hypersomnia with sleep apnea  EPWORTH SLEEPINESS SCORE:   3/24 HEIGHT: 5\' 6"  (167.6 cm)  WEIGHT: (!) 349 lb (158.305 kg)    Body mass index is 56.36 kg/(m^2).  NECK SIZE: 14 in.  MEDICATIONS: Charted for review  SLEEP ARCHITECTURE: Total sleep time 313 minutes with sleep efficiency 85.4%. Stage I was 3.4%, stage II 78.4%, stage III 4.6%, REM 13.6% of total sleep time. Sleep latency 17.5 minutes, REM latency 51 minutes, awake after sleep onset 37 minutes, arousal index 17.6, bedtime medication: None  RESPIRATORY DATA: Apnea hypopnea index (AHI) 6.9 per hour. 36 total events scored, all as obstructive apneas and mostly while nonsupine. REM AHI 42.4 per hour there were not enough early events to meet protocol requirements for split CPAP titration  OXYGEN DATA: Moderately loud snoring with oxygen desaturation to a nadir of 96% and mean saturation 99.8% with supplemental oxygen provided at 2 L/m.  CARDIAC DATA: Normal sinus rhythm  MOVEMENT/PARASOMNIA: No significant movement disturbance, bathroom 1  IMPRESSION/ RECOMMENDATION:   1) Mild obstructive sleep apnea/hypopnea syndrome, AHI 6.9 per hour with mostly nonsupine events. REM AHI 42.4 per hour. Moderately loud snoring with oxygen desaturation to a nadir of 96% and mean saturation 99.8% while wearing supplemental oxygen at 2 L/m   Deneise Lever Diplomate, American Board of Sleep Medicine  ELECTRONICALLY SIGNED ON:  08/04/2014, 2:40 PM [place] PH: [phone]   FX: [phone] [place]

## 2014-08-07 ENCOUNTER — Other Ambulatory Visit: Payer: Self-pay | Admitting: Family Medicine

## 2014-08-07 DIAGNOSIS — I5030 Unspecified diastolic (congestive) heart failure: Secondary | ICD-10-CM | POA: Diagnosis not present

## 2014-08-07 DIAGNOSIS — D509 Iron deficiency anemia, unspecified: Secondary | ICD-10-CM | POA: Diagnosis not present

## 2014-08-07 DIAGNOSIS — I129 Hypertensive chronic kidney disease with stage 1 through stage 4 chronic kidney disease, or unspecified chronic kidney disease: Secondary | ICD-10-CM | POA: Diagnosis not present

## 2014-08-07 DIAGNOSIS — I5032 Chronic diastolic (congestive) heart failure: Secondary | ICD-10-CM

## 2014-08-07 DIAGNOSIS — M1000 Idiopathic gout, unspecified site: Secondary | ICD-10-CM | POA: Diagnosis not present

## 2014-08-07 DIAGNOSIS — C3490 Malignant neoplasm of unspecified part of unspecified bronchus or lung: Secondary | ICD-10-CM | POA: Diagnosis not present

## 2014-08-07 DIAGNOSIS — J449 Chronic obstructive pulmonary disease, unspecified: Secondary | ICD-10-CM | POA: Diagnosis not present

## 2014-08-07 MED ORDER — LISINOPRIL 2.5 MG PO TABS: 2.5000 mg | ORAL_TABLET | Freq: Every day | ORAL | Status: DC

## 2014-08-07 NOTE — Progress Notes (Signed)
Reviewed BMP from 07/18/2014 after addition of Lisinopril 2.5 mg daily, creatinine is 1.33 and BUN is 25. Patient is tolerating medication well. Home health will draw repeat BMP on 08/08/2014.     Dorena Dew, FNP

## 2014-08-09 DIAGNOSIS — J449 Chronic obstructive pulmonary disease, unspecified: Secondary | ICD-10-CM | POA: Diagnosis not present

## 2014-08-09 DIAGNOSIS — C3490 Malignant neoplasm of unspecified part of unspecified bronchus or lung: Secondary | ICD-10-CM | POA: Diagnosis not present

## 2014-08-09 DIAGNOSIS — D509 Iron deficiency anemia, unspecified: Secondary | ICD-10-CM | POA: Diagnosis not present

## 2014-08-09 DIAGNOSIS — I5030 Unspecified diastolic (congestive) heart failure: Secondary | ICD-10-CM | POA: Diagnosis not present

## 2014-08-09 DIAGNOSIS — I129 Hypertensive chronic kidney disease with stage 1 through stage 4 chronic kidney disease, or unspecified chronic kidney disease: Secondary | ICD-10-CM | POA: Diagnosis not present

## 2014-08-09 DIAGNOSIS — M1000 Idiopathic gout, unspecified site: Secondary | ICD-10-CM | POA: Diagnosis not present

## 2014-08-23 ENCOUNTER — Ambulatory Visit (INDEPENDENT_AMBULATORY_CARE_PROVIDER_SITE_OTHER): Payer: Medicare Other | Admitting: Cardiology

## 2014-08-23 ENCOUNTER — Encounter: Payer: Self-pay | Admitting: Cardiology

## 2014-08-23 VITALS — BP 130/70 | HR 68 | Ht 66.0 in | Wt 340.0 lb

## 2014-08-23 DIAGNOSIS — R0602 Shortness of breath: Secondary | ICD-10-CM

## 2014-08-23 DIAGNOSIS — E878 Other disorders of electrolyte and fluid balance, not elsewhere classified: Secondary | ICD-10-CM

## 2014-08-23 LAB — BASIC METABOLIC PANEL
BUN: 41 mg/dL — AB (ref 6–23)
CALCIUM: 9 mg/dL (ref 8.4–10.5)
CO2: 28 meq/L (ref 19–32)
Chloride: 101 mEq/L (ref 96–112)
Creat: 1.7 mg/dL — ABNORMAL HIGH (ref 0.50–1.10)
Glucose, Bld: 76 mg/dL (ref 70–99)
Potassium: 4.3 mEq/L (ref 3.5–5.3)
SODIUM: 140 meq/L (ref 135–145)

## 2014-08-23 NOTE — Patient Instructions (Signed)
Your physician recommends that you schedule a follow-up appointment in: as needed with Dr. Percival Spanish  We have ordered labs for you to get done

## 2014-08-23 NOTE — Progress Notes (Signed)
Cardiology Office Note   Date:  08/23/2014   ID:  Pamela [name], DOB [birth year], MRN [id number]  PCP:  MATTHEWS,MICHELLE A., MD  Cardiologist:   Minus Breeding, MD   Chief Complaint  Patient presents with  . Shortness of Breath      History of Present Illness: Pamela [name] is a 64 y.o. female who presents for CHF.  She has an extensive medical history from [name]. She has relocated here. She has seen Dr. Melvyn Novas.  Her past medical records suggest sarcoidosis although I don't see documentation of this. She did have lung cancer diagnosed in 2012 and I do see documentation of this with a history of adenocarcinoma treated with radiation but no recurrence noted on follow-up PET scanning.  I reviewed several outside scans the most recent being July 2013. She has had echocardiograms most recently in November with an EF of 65-70%. There was moderate concentric hypertrophy. She has had CTs of her chest and I don't see a suggestion of sarcoid from those. She apparently has had some heart failure in the past with a preserved ejection fraction. I do note in November that her BNP was 1193. She's been on home O2 for years she says since her cancer diagnosis. Dr. Melvyn Novas sent her for a sleep study that she's had this diagnosis before but the results are pending. He suggested that her lung disease was relatively mild after reviewing her pulmonary function test. He's managing her with medications as listed below.  She presents to me for follow-up of her diagnosis heart failure. She actually states she is doing relatively well. She wears her oxygen continuously. She's been able to do some exercise and working with physical therapy. She thinks her breathing is at baseline. She will get dyspneic with exertion but she's not describing any resting shortness of breath or new PND or orthopnea. She uses 4 pillows usually. She might get some increasing shortness of breath if she increases her salt but she is careful not to  do this. She takes a low dose of diuretic and tries to reduce her fluid intake. With exercise and diet she's actually lost 9 pounds. She's not having any chest pressure, neck or arm discomfort.   Past Medical History  Diagnosis Date  . CHF (congestive heart failure)     Preserved EF  . Asthma   . COPD (chronic obstructive pulmonary disease)   . Arthritis   . Sarcoidosis   . Cancer     lung, adenocarcinoma right lung 2012  . Hypertension   . Sleep apnea   . Oxygen deficiency     Past Surgical History  Procedure Laterality Date  . Lung biopsy    . Tubal ligation    . Vein ligation and stripping       Current Outpatient Prescriptions  Medication Sig Dispense Refill  . aspirin 81 MG tablet Take 81 mg by mouth daily.    . Calcium Carbonate-Vitamin D (CALTRATE 600+D PO) Take 1 tablet by mouth 1 day or 1 dose.    . febuxostat (ULORIC) 40 MG tablet Take 1 tablet (40 mg total) by mouth daily. 30 tablet 2  . ferrous sulfate 325 (65 FE) MG tablet Take 1 tablet (325 mg total) by mouth 2 (two) times daily with a meal. 60 tablet 2  . furosemide (LASIX) 20 MG tablet Take 1 tablet (20 mg total) by mouth 2 (two) times daily. 60 tablet 2  . lisinopril (ZESTRIL) 2.5 MG tablet Take 1 tablet (2.5  mg total) by mouth daily. 30 tablet 0  . oxycodone (OXY-IR) 5 MG capsule Take 1 capsule (5 mg total) by mouth every 4 (four) hours as needed for pain. 60 capsule 0  . predniSONE (DELTASONE) 10 MG tablet Take 1 tablet (10 mg total) by mouth daily with breakfast. 30 tablet 2  . senna-docusate (SENOKOT-S) 8.6-50 MG per tablet Take 1 tablet by mouth 2 (two) times daily.    [name] Kitchen spironolactone (ALDACTONE) 25 MG tablet Take 1 tablet (25 mg total) by mouth daily. 30 tablet 0   No current facility-administered medications for this visit.    Allergies:   Sulfur    Social History:  The patient  reports that she has never smoked. She has never used smokeless tobacco. She reports that she does not drink alcohol or  use illicit drugs.   Family History:  The patient's family history includes Cancer in her brother and father; Cervical cancer in her sister; Diabetes in her brother and sister; Heart disease in her father; Hypertension in her mother; Multiple myeloma in her sister; Renal Disease in her mother.    ROS:  Please see the history of present illness.   Otherwise, review of systems are positive for none.   All other systems are reviewed and negative.    PHYSICAL EXAM: VS:  BP 130/70 mmHg  Pulse 68  Ht '5\' 6"'  (1.676 m)  Wt 340 lb (154.223 kg)  BMI 54.90 kg/m2 , BMI Body mass index is 54.9 kg/(m^2). GEN: Well nourished, well developed, in no acute distress HEENT: normal Neck: no JVD, carotid bruits, or masses Cardiac: RRR; no murmurs, rubs, or gallops,no edema  Respiratory:  clear to auscultation bilaterally, normal work of breathing GI: soft, nontender, nondistended, + BS MS: no deformity or atrophy EXT:  Severe edema bilateral legs, chronic venous stasis changes with probable lymphadenopathy. Skin: warm and dry, no rash Neuro:  Strength and sensation are intact Psych: euthymic mood, full affect   EKG:  EKG is ordered today. The ekg ordered today demonstrates sinus rhythm, rate 68, left axis deviation, no acute ST-T wave changes.   Recent Labs: 05/21/2014: TSH 3.934 06/11/2014: ALT 28; Pro B Natriuretic peptide (BNP) 1193.0* 07/05/2014: BUN 35*; Creatinine 1.60*; Hemoglobin 9.2*; Platelets 206; Potassium 4.0; Sodium 140    Lipid Panel    Component Value Date/Time   CHOL 171 04/13/2014 1234   TRIG 116 04/13/2014 1234   HDL 59 04/13/2014 1234   CHOLHDL 2.9 04/13/2014 1234   VLDL 23 04/13/2014 1234   LDLCALC 89 04/13/2014 1234      Wt Readings from Last 3 Encounters:  08/23/14 340 lb (154.223 kg)  07/31/14 349 lb (158.305 kg)  07/19/14 349 lb (158.305 kg)      Other studies Reviewed: Additional studies/ records that were reviewed today include: PET scan, CT scans,  echocardiogram,  Extensive records from the [facility]. Review of the above records demonstrates: Reported above.   ASSESSMENT AND PLAN:  HeFPEF:  Heart failure with preserved ejection fraction. She seems to be euvolemic. She did have an elevated BNP at one point in time. However, this was in November. We discussed at length salt and fluid restriction. She's going to try to get a scale for daily weights. She is going to continue to restrict the salt. I think she should stay on the meds as listed. I will order another BNP for follow-up and might adjust her Lasix as needed.  SARCOIDOSIS:  I don't see evidence of this.  He likely is subclinical. No further imaging is indicated.  OBESITY:  I applauded her weight loss. I encourage more of the same.  HTN:  Her blood pressure is controlled on the current medications. She will remain on this.   Current medicines are reviewed at length with the patient today.  The patient does not have concerns regarding medicines.  The following changes have been made:  no change  Labs/ tests ordered today include: BNP  No orders of the defined types were placed in this encounter.     Disposition:   FU with me in 6 months   Signed, Minus Breeding, MD  08/23/2014 10:56 AM    [place]

## 2014-08-27 ENCOUNTER — Other Ambulatory Visit: Payer: Self-pay | Admitting: *Deleted

## 2014-08-27 DIAGNOSIS — R0602 Shortness of breath: Secondary | ICD-10-CM

## 2014-08-27 DIAGNOSIS — E8770 Fluid overload, unspecified: Secondary | ICD-10-CM

## 2014-08-31 DIAGNOSIS — R0602 Shortness of breath: Secondary | ICD-10-CM | POA: Diagnosis not present

## 2014-08-31 DIAGNOSIS — E8770 Fluid overload, unspecified: Secondary | ICD-10-CM | POA: Diagnosis not present

## 2014-08-31 LAB — BRAIN NATRIURETIC PEPTIDE: BRAIN NATRIURETIC PEPTIDE: 65.7 pg/mL (ref 0.0–100.0)

## 2014-09-04 ENCOUNTER — Other Ambulatory Visit: Payer: Self-pay | Admitting: Family Medicine

## 2014-10-03 ENCOUNTER — Encounter: Payer: Self-pay | Admitting: Family Medicine

## 2014-10-03 ENCOUNTER — Ambulatory Visit (INDEPENDENT_AMBULATORY_CARE_PROVIDER_SITE_OTHER): Payer: Medicare Other | Admitting: Family Medicine

## 2014-10-03 VITALS — BP 122/48 | HR 79 | Temp 98.3°F | Resp 18 | Ht 66.0 in | Wt 346.0 lb

## 2014-10-03 DIAGNOSIS — D869 Sarcoidosis, unspecified: Secondary | ICD-10-CM | POA: Diagnosis not present

## 2014-10-03 DIAGNOSIS — Z9289 Personal history of other medical treatment: Secondary | ICD-10-CM

## 2014-10-03 DIAGNOSIS — D509 Iron deficiency anemia, unspecified: Secondary | ICD-10-CM | POA: Diagnosis not present

## 2014-10-03 DIAGNOSIS — M25561 Pain in right knee: Secondary | ICD-10-CM

## 2014-10-03 DIAGNOSIS — I5032 Chronic diastolic (congestive) heart failure: Secondary | ICD-10-CM

## 2014-10-03 DIAGNOSIS — M1009 Idiopathic gout, multiple sites: Secondary | ICD-10-CM | POA: Diagnosis not present

## 2014-10-03 DIAGNOSIS — M109 Gout, unspecified: Secondary | ICD-10-CM

## 2014-10-03 DIAGNOSIS — I509 Heart failure, unspecified: Secondary | ICD-10-CM | POA: Diagnosis not present

## 2014-10-03 LAB — CBC WITH DIFFERENTIAL/PLATELET
Basophils Absolute: 0 10*3/uL (ref 0.0–0.1)
Basophils Relative: 0 % (ref 0–1)
EOS ABS: 0.1 10*3/uL (ref 0.0–0.7)
Eosinophils Relative: 2 % (ref 0–5)
HCT: 32.1 % — ABNORMAL LOW (ref 36.0–46.0)
Hemoglobin: 9.9 g/dL — ABNORMAL LOW (ref 12.0–15.0)
LYMPHS PCT: 8 % — AB (ref 12–46)
Lymphs Abs: 0.6 10*3/uL — ABNORMAL LOW (ref 0.7–4.0)
MCH: 21.7 pg — ABNORMAL LOW (ref 26.0–34.0)
MCHC: 30.8 g/dL (ref 30.0–36.0)
MCV: 70.4 fL — ABNORMAL LOW (ref 78.0–100.0)
MPV: 9.7 fL (ref 8.6–12.4)
Monocytes Absolute: 0.3 10*3/uL (ref 0.1–1.0)
Monocytes Relative: 4 % (ref 3–12)
NEUTROS ABS: 6.1 10*3/uL (ref 1.7–7.7)
Neutrophils Relative %: 86 % — ABNORMAL HIGH (ref 43–77)
PLATELETS: 168 10*3/uL (ref 150–400)
RBC: 4.56 MIL/uL (ref 3.87–5.11)
RDW: 16 % — ABNORMAL HIGH (ref 11.5–15.5)
WBC: 7.1 10*3/uL (ref 4.0–10.5)

## 2014-10-03 LAB — BASIC METABOLIC PANEL
BUN: 36 mg/dL — ABNORMAL HIGH (ref 6–23)
CALCIUM: 8.4 mg/dL (ref 8.4–10.5)
CHLORIDE: 102 meq/L (ref 96–112)
CO2: 27 mEq/L (ref 19–32)
CREATININE: 1.37 mg/dL — AB (ref 0.50–1.10)
Glucose, Bld: 164 mg/dL — ABNORMAL HIGH (ref 70–99)
Potassium: 4.4 mEq/L (ref 3.5–5.3)
SODIUM: 139 meq/L (ref 135–145)

## 2014-10-03 MED ORDER — OXYCODONE HCL 5 MG PO CAPS: 5.0000 mg | ORAL_CAPSULE | Freq: Four times a day (QID) | ORAL | Status: DC | PRN

## 2014-10-03 MED ORDER — FUROSEMIDE 20 MG PO TABS: 20.0000 mg | ORAL_TABLET | Freq: Two times a day (BID) | ORAL | Status: DC

## 2014-10-03 MED ORDER — FEBUXOSTAT 40 MG PO TABS: 40.0000 mg | ORAL_TABLET | Freq: Every day | ORAL | Status: DC

## 2014-10-03 MED ORDER — PREDNISONE 10 MG PO TABS: 10.0000 mg | ORAL_TABLET | Freq: Every day | ORAL | Status: DC

## 2014-10-03 MED ORDER — FERROUS SULFATE 325 (65 FE) MG PO TABS: 325.0000 mg | ORAL_TABLET | Freq: Every day | ORAL | Status: DC

## 2014-10-03 NOTE — Progress Notes (Signed)
Subjective:    Patient ID: Pamela [name], female    DOB: 1950/12/22, 64 y.o.   MRN: [id number]  HPI  Pamela [name] is a 64 year old female with history of chronic diastolic CHF, OSA, HTN, left solitary kidney, and sarcodosis. She states that she feels well with minimal complaints.   Pamela [name] has a history of sarcoidosis. She is presently controlled on steroid therapy. She states that last CT scan was in April 2015, which was stable. She states that her CT has been unchanged since 2014. She currently has shortness of breath on exertion. She is on continuous oxygen therapy at 2 L. She denies headache, fatigue, dizziness, chest pains, wheezing, fever, hemoptysis, or tachypnea  Patient has No headache, No chest pain, No abdominal pain - No Nausea, No new weakness tingling or numbness, No Cough - SOB Past Medical History  Diagnosis Date  . CHF (congestive heart failure)     Preserved EF  . Asthma   . COPD (chronic obstructive pulmonary disease)   . Arthritis   . Sarcoidosis   . Cancer     lung, adenocarcinoma right lung 2012  . Hypertension   . Sleep apnea   . Oxygen deficiency    History   Social History  . Marital Status: Divorced    Spouse Name: N/A  . Number of Children: 2  . Years of Education: N/A   Social History Main Topics  . Smoking status: Never Smoker   . Smokeless tobacco: Never Used  . Alcohol Use: No  . Drug Use: No  . Sexual Activity: Not on file   Other Topics Concern  . None   Social History Narrative   Lives with son.  One son is deceased.     Allergies  Allergen Reactions  . Sulfur Rash   Review of Systems  Constitutional: Negative.   HENT: Negative.   Eyes: Negative.   Respiratory: Negative.   Cardiovascular: Negative.   Gastrointestinal: Negative.   Endocrine: Negative.   Genitourinary: Negative.   Skin: Negative.   Allergic/Immunologic: Negative.   Neurological: Negative.   Hematological: Negative.   Psychiatric/Behavioral:  Negative.        Objective:   Physical Exam  Constitutional: She is oriented to person, place, and time. Vital signs are normal. She appears well-developed and well-nourished. Nasal cannula in place.  HENT:  Head: Normocephalic and atraumatic.  Right Ear: Hearing, tympanic membrane, external ear and ear canal normal.  Left Ear: Hearing, tympanic membrane, external ear and ear canal normal.  Mouth/Throat: Uvula is midline and oropharynx is clear and moist. Abnormal dentition. No dental caries.  Eyes: Conjunctivae and EOM are normal. Pupils are equal, round, and reactive to light. Lids are everted and swept, no foreign bodies found.  Neck: Trachea normal and normal range of motion. Neck supple.  Cardiovascular: Normal rate, regular rhythm, S1 normal, S2 normal, normal heart sounds and intact distal pulses.   Bilateral lower extremity edema  Pulmonary/Chest: Effort normal and breath sounds normal.  Abdominal: Soft. Normal appearance and bowel sounds are normal.  Neurological: She is alert and oriented to person, place, and time. She has normal reflexes.  Skin: Skin is warm, dry and intact.  Hyperpigmentation to lower extremities  Psychiatric: She has a normal mood and affect. Her speech is normal and behavior is normal. Judgment and thought content normal. Cognition and memory are normal.         BP 122/48 mmHg  Pulse 79  Temp(Src) 98.3  F (36.8 C) (Oral)  Resp 18  Ht 5\' 6"  (1.676 m)  Wt 346 lb (156.945 kg)  BMI 55.87 kg/m2  SpO2 100% Assessment & Plan:  1. Gouty arthritis Gout controlled on current medication regimen. Pamela [name] maintains that she has not had a gout attack since November 2015.  - febuxostat (ULORIC) 40 MG tablet; Take 1 tablet (40 mg total) by mouth daily.  Dispense: 90 tablet; Refill: 0  2. Anemia, iron deficiency - ferrous sulfate 325 (65 FE) MG tablet; Take 1 tablet (325 mg total) by mouth daily with breakfast.  Dispense: 90 tablet; Refill: 0 - CBC with  Differential  3. Chronic diastolic CHF (congestive heart failure) - furosemide (LASIX) 20 MG tablet; Take 1 tablet (20 mg total) by mouth 2 (two) times daily.  Dispense: 180 tablet; Refill: 0 - Basic Metabolic Panel  4. Sarcoidosis Pamela [name] is followed by Dr. Christinia Gully, pulmonologist. Reviewed previous office note from 07/19/2014. Patient was continued on daily prednisone 10 mg. Will continue on 2 liters of oxygen.   - predniSONE (DELTASONE) 10 MG tablet; Take 1 tablet (10 mg total) by mouth daily with breakfast.  Dispense: 30 tablet; Refill: 2  5. Knee pain, right anterior Pamela [name] has intermittent right knee pain. She is unable to take NSAIDS due to solitary kidney. Will continue Oxycodone IR every 6 hours as needed for moderate to severe pain. Reviewed [place] Substance Reporting system prior to reorder  - oxycodone (OXY-IR) 5 MG capsule; Take 1 capsule (5 mg total) by mouth every 6 (six) hours as needed for pain.  Dispense: 60 capsule; Refill: 0  RTC: 3 months for complete physical examination with pap smear.    Dorena Dew, FNP

## 2014-10-03 NOTE — Patient Instructions (Signed)
Gout Gout is an inflammatory arthritis caused by a buildup of uric acid crystals in the joints. Uric acid is a chemical that is normally present in the blood. When the level of uric acid in the blood is too high it can form crystals that deposit in your joints and tissues. This causes joint redness, soreness, and swelling (inflammation). Repeat attacks are common. Over time, uric acid crystals can form into masses (tophi) near a joint, destroying bone and causing disfigurement. Gout is treatable and often preventable. CAUSES  The disease begins with elevated levels of uric acid in the blood. Uric acid is produced by your body when it breaks down a naturally found substance called purines. Certain foods you eat, such as meats and fish, contain high amounts of purines. Causes of an elevated uric acid level include:  Being passed down from parent to child (heredity).  Diseases that cause increased uric acid production (such as obesity, psoriasis, and certain cancers).  Excessive alcohol use.  Diet, especially diets rich in meat and seafood.  Medicines, including certain cancer-fighting medicines (chemotherapy), water pills (diuretics), and aspirin.  Chronic kidney disease. The kidneys are no longer able to remove uric acid well.  Problems with metabolism. Conditions strongly associated with gout include:  Obesity.  High blood pressure.  High cholesterol.  Diabetes. Not everyone with elevated uric acid levels gets gout. It is not understood why some people get gout and others do not. Surgery, joint injury, and eating too much of certain foods are some of the factors that can lead to gout attacks. SYMPTOMS   An attack of gout comes on quickly. It causes intense pain with redness, swelling, and warmth in a joint.  Fever can occur.  Often, only one joint is involved. Certain joints are more commonly involved:  Base of the big toe.  Knee.  Ankle.  Wrist.  Finger. Without  treatment, an attack usually goes away in a few days to weeks. Between attacks, you usually will not have symptoms, which is different from many other forms of arthritis. DIAGNOSIS  Your caregiver will suspect gout based on your symptoms and exam. In some cases, tests may be recommended. The tests may include:  Blood tests.  Urine tests.  X-rays.  Joint fluid exam. This exam requires a needle to remove fluid from the joint (arthrocentesis). Using a microscope, gout is confirmed when uric acid crystals are seen in the joint fluid. TREATMENT  There are two phases to gout treatment: treating the sudden onset (acute) attack and preventing attacks (prophylaxis).  Treatment of an Acute Attack.  Medicines are used. These include anti-inflammatory medicines or steroid medicines.  An injection of steroid medicine into the affected joint is sometimes necessary.  The painful joint is rested. Movement can worsen the arthritis.  You may use warm or cold treatments on painful joints, depending which works best for you.  Treatment to Prevent Attacks.  If you suffer from frequent gout attacks, your caregiver may advise preventive medicine. These medicines are started after the acute attack subsides. These medicines either help your kidneys eliminate uric acid from your body or decrease your uric acid production. You may need to stay on these medicines for a very long time.  The early phase of treatment with preventive medicine can be associated with an increase in acute gout attacks. For this reason, during the first few months of treatment, your caregiver may also advise you to take medicines usually used for acute gout treatment. Be sure you   understand your caregiver's directions. Your caregiver may make several adjustments to your medicine dose before these medicines are effective.  Discuss dietary treatment with your caregiver or dietitian. Alcohol and drinks high in sugar and fructose and foods  such as meat, poultry, and seafood can increase uric acid levels. Your caregiver or dietitian can advise you on drinks and foods that should be limited. HOME CARE INSTRUCTIONS   Do not take aspirin to relieve pain. This raises uric acid levels.  Only take over-the-counter or prescription medicines for pain, discomfort, or fever as directed by your caregiver.  Rest the joint as much as possible. When in bed, keep sheets and blankets off painful areas.  Keep the affected joint raised (elevated).  Apply warm or cold treatments to painful joints. Use of warm or cold treatments depends on which works best for you.  Use crutches if the painful joint is in your leg.  Drink enough fluids to keep your urine clear or pale yellow. This helps your body get rid of uric acid. Limit alcohol, sugary drinks, and fructose drinks.  Follow your dietary instructions. Pay careful attention to the amount of protein you eat. Your daily diet should emphasize fruits, vegetables, whole grains, and fat-free or low-fat milk products. Discuss the use of coffee, vitamin C, and cherries with your caregiver or dietitian. These may be helpful in lowering uric acid levels.  Maintain a healthy body weight. SEEK MEDICAL CARE IF:   You develop diarrhea, vomiting, or any side effects from medicines.  You do not feel better in 24 hours, or you are getting worse. SEEK IMMEDIATE MEDICAL CARE IF:   Your joint becomes suddenly more tender, and you have chills or a fever. MAKE SURE YOU:   Understand these instructions.  Will watch your condition.  Will get help right away if you are not doing well or get worse. Document Released: 07/03/2000 Document Revised: 11/20/2013 Document Reviewed: 02/17/2012 [facility] Patient Information 2015 [place], [place]. This information is not intended to replace advice given to you by your health care provider. Make sure you discuss any questions you have with your health care  provider. Gout Gout is an inflammatory arthritis caused by a buildup of uric acid crystals in the joints. Uric acid is a chemical that is normally present in the blood. When the level of uric acid in the blood is too high it can form crystals that deposit in your joints and tissues. This causes joint redness, soreness, and swelling (inflammation). Repeat attacks are common. Over time, uric acid crystals can form into masses (tophi) near a joint, destroying bone and causing disfigurement. Gout is treatable and often preventable. CAUSES  The disease begins with elevated levels of uric acid in the blood. Uric acid is produced by your body when it breaks down a naturally found substance called purines. Certain foods you eat, such as meats and fish, contain high amounts of purines. Causes of an elevated uric acid level include:  Being passed down from parent to child (heredity).  Diseases that cause increased uric acid production (such as obesity, psoriasis, and certain cancers).  Excessive alcohol use.  Diet, especially diets rich in meat and seafood.  Medicines, including certain cancer-fighting medicines (chemotherapy), water pills (diuretics), and aspirin.  Chronic kidney disease. The kidneys are no longer able to remove uric acid well.  Problems with metabolism. Conditions strongly associated with gout include:  Obesity.  High blood pressure.  High cholesterol.  Diabetes. Not everyone with elevated uric acid levels  gets gout. It is not understood why some people get gout and others do not. Surgery, joint injury, and eating too much of certain foods are some of the factors that can lead to gout attacks. SYMPTOMS   An attack of gout comes on quickly. It causes intense pain with redness, swelling, and warmth in a joint.  Fever can occur.  Often, only one joint is involved. Certain joints are more commonly involved:  Base of the big  toe.  Knee.  Ankle.  Wrist.  Finger. Without treatment, an attack usually goes away in a few days to weeks. Between attacks, you usually will not have symptoms, which is different from many other forms of arthritis. DIAGNOSIS  Your caregiver will suspect gout based on your symptoms and exam. In some cases, tests may be recommended. The tests may include:  Blood tests.  Urine tests.  X-rays.  Joint fluid exam. This exam requires a needle to remove fluid from the joint (arthrocentesis). Using a microscope, gout is confirmed when uric acid crystals are seen in the joint fluid. TREATMENT  There are two phases to gout treatment: treating the sudden onset (acute) attack and preventing attacks (prophylaxis).  Treatment of an Acute Attack.  Medicines are used. These include anti-inflammatory medicines or steroid medicines.  An injection of steroid medicine into the affected joint is sometimes necessary.  The painful joint is rested. Movement can worsen the arthritis.  You may use warm or cold treatments on painful joints, depending which works best for you.  Treatment to Prevent Attacks.  If you suffer from frequent gout attacks, your caregiver may advise preventive medicine. These medicines are started after the acute attack subsides. These medicines either help your kidneys eliminate uric acid from your body or decrease your uric acid production. You may need to stay on these medicines for a very long time.  The early phase of treatment with preventive medicine can be associated with an increase in acute gout attacks. For this reason, during the first few months of treatment, your caregiver may also advise you to take medicines usually used for acute gout treatment. Be sure you understand your caregiver's directions. Your caregiver may make several adjustments to your medicine dose before these medicines are effective.  Discuss dietary treatment with your caregiver or dietitian.  Alcohol and drinks high in sugar and fructose and foods such as meat, poultry, and seafood can increase uric acid levels. Your caregiver or dietitian can advise you on drinks and foods that should be limited. HOME CARE INSTRUCTIONS   Do not take aspirin to relieve pain. This raises uric acid levels.  Only take over-the-counter or prescription medicines for pain, discomfort, or fever as directed by your caregiver.  Rest the joint as much as possible. When in bed, keep sheets and blankets off painful areas.  Keep the affected joint raised (elevated).  Apply warm or cold treatments to painful joints. Use of warm or cold treatments depends on which works best for you.  Use crutches if the painful joint is in your leg.  Drink enough fluids to keep your urine clear or pale yellow. This helps your body get rid of uric acid. Limit alcohol, sugary drinks, and fructose drinks.  Follow your dietary instructions. Pay careful attention to the amount of protein you eat. Your daily diet should emphasize fruits, vegetables, whole grains, and fat-free or low-fat milk products. Discuss the use of coffee, vitamin C, and cherries with your caregiver or dietitian. These may be helpful  in lowering uric acid levels.  Maintain a healthy body weight. SEEK MEDICAL CARE IF:   You develop diarrhea, vomiting, or any side effects from medicines.  You do not feel better in 24 hours, or you are getting worse. SEEK IMMEDIATE MEDICAL CARE IF:   Your joint becomes suddenly more tender, and you have chills or a fever. MAKE SURE YOU:   Understand these instructions.  Will watch your condition.  Will get help right away if you are not doing well or get worse. Document Released: 07/03/2000 Document Revised: 11/20/2013 Document Reviewed: 02/17/2012 [facility] Patient Information 2015 [place], [place]. This information is not intended to replace advice given to you by your health care provider. Make sure you discuss any  questions you have with your health care provider. Heart Failure Heart failure is a condition in which the heart has trouble pumping blood. This means your heart does not pump blood efficiently for your body to work well. In some cases of heart failure, fluid may back up into your lungs or you may have swelling (edema) in your lower legs. Heart failure is usually a long-term (chronic) condition. It is important for you to take good care of yourself and follow your health care provider's treatment plan. CAUSES  Some health conditions can cause heart failure. Those health conditions include:  High blood pressure (hypertension). Hypertension causes the heart muscle to work harder than normal. When pressure in the blood vessels is high, the heart needs to pump (contract) with more force in order to circulate blood throughout the body. High blood pressure eventually causes the heart to become stiff and weak.  Coronary artery disease (CAD). CAD is the buildup of cholesterol and fat (plaque) in the arteries of the heart. The blockage in the arteries deprives the heart muscle of oxygen and blood. This can cause chest pain and may lead to a heart attack. High blood pressure can also contribute to CAD.  Heart attack (myocardial infarction). A heart attack occurs when one or more arteries in the heart become blocked. The loss of oxygen damages the muscle tissue of the heart. When this happens, part of the heart muscle dies. The injured tissue does not contract as well and weakens the heart's ability to pump blood.  Abnormal heart valves. When the heart valves do not open and close properly, it can cause heart failure. This makes the heart muscle pump harder to keep the blood flowing.  Heart muscle disease (cardiomyopathy or myocarditis). Heart muscle disease is damage to the heart muscle from a variety of causes. These can include drug or alcohol abuse, infections, or unknown reasons. These can increase the risk  of heart failure.  Lung disease. Lung disease makes the heart work harder because the lungs do not work properly. This can cause a strain on the heart, leading it to fail.  Diabetes. Diabetes increases the risk of heart failure. High blood sugar contributes to high fat (lipid) levels in the blood. Diabetes can also cause slow damage to tiny blood vessels that carry important nutrients to the heart muscle. When the heart does not get enough oxygen and food, it can cause the heart to become weak and stiff. This leads to a heart that does not contract efficiently.  Other conditions can contribute to heart failure. These include abnormal heart rhythms, thyroid problems, and low blood counts (anemia). Certain unhealthy behaviors can increase the risk of heart failure, including:  Being overweight.  Smoking or chewing tobacco.  Eating foods high  in fat and cholesterol.  Abusing illicit drugs or alcohol.  Lacking physical activity. SYMPTOMS  Heart failure symptoms may vary and can be hard to detect. Symptoms may include:  Shortness of breath with activity, such as climbing stairs.  Persistent cough.  Swelling of the feet, ankles, legs, or abdomen.  Unexplained weight gain.  Difficulty breathing when lying flat (orthopnea).  Waking from sleep because of the need to sit up and get more air.  Rapid heartbeat.  Fatigue and loss of energy.  Feeling light-headed, dizzy, or close to fainting.  Loss of appetite.  Nausea.  Increased urination during the night (nocturia). DIAGNOSIS  A diagnosis of heart failure is based on your history, symptoms, physical examination, and diagnostic tests. Diagnostic tests for heart failure may include:  Echocardiography.  Electrocardiography.  Chest X-ray.  Blood tests.  Exercise stress test.  Cardiac angiography.  Radionuclide scans. TREATMENT  Treatment is aimed at managing the symptoms of heart failure. Medicines, behavioral changes,  or surgical intervention may be necessary to treat heart failure.  Medicines to help treat heart failure may include:  Angiotensin-converting enzyme (ACE) inhibitors. This type of medicine blocks the effects of a blood protein called angiotensin-converting enzyme. ACE inhibitors relax (dilate) the blood vessels and help lower blood pressure.  Angiotensin receptor blockers (ARBs). This type of medicine blocks the actions of a blood protein called angiotensin. Angiotensin receptor blockers dilate the blood vessels and help lower blood pressure.  Water pills (diuretics). Diuretics cause the kidneys to remove salt and water from the blood. The extra fluid is removed through urination. This loss of extra fluid lowers the volume of blood the heart pumps.  Beta blockers. These prevent the heart from beating too fast and improve heart muscle strength.  Digitalis. This increases the force of the heartbeat.  Healthy behavior changes include:  Obtaining and maintaining a healthy weight.  Stopping smoking or chewing tobacco.  Eating heart-healthy foods.  Limiting or avoiding alcohol.  Stopping illicit drug use.  Physical activity as directed by your health care provider.  Surgical treatment for heart failure may include:  A procedure to open blocked arteries, repair damaged heart valves, or remove damaged heart muscle tissue.  A pacemaker to improve heart muscle function and control certain abnormal heart rhythms.  An internal cardioverter defibrillator to treat certain serious abnormal heart rhythms.  A left ventricular assist device (LVAD) to assist the pumping ability of the heart. HOME CARE INSTRUCTIONS   Take medicines only as directed by your health care provider. Medicines are important in reducing the workload of your heart, slowing the progression of heart failure, and improving your symptoms.  Do not stop taking your medicine unless directed by your health care provider.  Do  not skip any dose of medicine.  Refill your prescriptions before you run out of medicine. Your medicines are needed every day.  Engage in moderate physical activity if directed by your health care provider. Moderate physical activity can benefit some people. The elderly and people with severe heart failure should consult with a health care provider for physical activity recommendations.  Eat heart-healthy foods. Food choices should be free of trans fat and low in saturated fat, cholesterol, and salt (sodium). Healthy choices include fresh or frozen fruits and vegetables, fish, lean meats, legumes, fat-free or low-fat dairy products, and whole grain or high fiber foods. Talk to a dietitian to learn more about heart-healthy foods.  Limit sodium if directed by your health care provider. Sodium restriction may  reduce symptoms of heart failure in some people. Talk to a dietitian to learn more about heart-healthy seasonings.  Use healthy cooking methods. Healthy cooking methods include roasting, grilling, broiling, baking, poaching, steaming, or stir-frying. Talk to a dietitian to learn more about healthy cooking methods.  Limit fluids if directed by your health care provider. Fluid restriction may reduce symptoms of heart failure in some people.  Weigh yourself every day. Daily weights are important in the early recognition of excess fluid. You should weigh yourself every morning after you urinate and before you eat breakfast. Wear the same amount of clothing each time you weigh yourself. Record your daily weight. Provide your health care provider with your weight record.  Monitor and record your blood pressure if directed by your health care provider.  Check your pulse if directed by your health care provider.  Lose weight if directed by your health care provider. Weight loss may reduce symptoms of heart failure in some people.  Stop smoking or chewing tobacco. Nicotine makes your heart work harder  by causing your blood vessels to constrict. Do not use nicotine gum or patches before talking to your health care provider.  Keep all follow-up visits as directed by your health care provider. This is important.  Limit alcohol intake to no more than 1 drink per day for nonpregnant women and 2 drinks per day for men. One drink equals 12 ounces of beer, 5 ounces of wine, or 1 ounces of hard liquor. Drinking more than that is harmful to your heart. Tell your health care provider if you drink alcohol several times a week. Talk with your health care provider about whether alcohol is safe for you. If your heart has already been damaged by alcohol or you have severe heart failure, drinking alcohol should be stopped completely.  Stop illicit drug use.  Stay up-to-date with immunizations. It is especially important to prevent respiratory infections through current pneumococcal and influenza immunizations.  Manage other health conditions such as hypertension, diabetes, thyroid disease, or abnormal heart rhythms as directed by your health care provider.  Learn to manage stress.  Plan rest periods when fatigued.  Learn strategies to manage high temperatures. If the weather is extremely hot:  Avoid vigorous physical activity.  Use air conditioning or fans or seek a cooler location.  Avoid caffeine and alcohol.  Wear loose-fitting, lightweight, and light-colored clothing.  Learn strategies to manage cold temperatures. If the weather is extremely cold:  Avoid vigorous physical activity.  Layer clothes.  Wear mittens or gloves, a hat, and a scarf when going outside.  Avoid alcohol.  Obtain ongoing education and support as needed.  Participate in or seek rehabilitation as needed to maintain or improve independence and quality of life. SEEK MEDICAL CARE IF:   Your weight increases by 03 lb/1.4 kg in 1 day or 05 lb/2.3 kg in a week.  You have increasing shortness of breath that is unusual  for you.  You are unable to participate in your usual physical activities.  You tire easily.  You cough more than normal, especially with physical activity.  You have any or more swelling in areas such as your hands, feet, ankles, or abdomen.  You are unable to sleep because it is hard to breathe.  You feel like your heart is beating fast (palpitations).  You become dizzy or light-headed upon standing up. SEEK IMMEDIATE MEDICAL CARE IF:   You have difficulty breathing.  There is a change in mental status such  as decreased alertness or difficulty with concentration.  You have a pain or discomfort in your chest.  You have an episode of fainting (syncope). MAKE SURE YOU:   Understand these instructions.  Will watch your condition.  Will get help right away if you are not doing well or get worse. Document Released: 07/06/2005 Document Revised: 11/20/2013 Document Reviewed: 08/05/2012 [facility] Patient Information 2015 [place], [place]. This information is not intended to replace advice given to you by your health care provider. Make sure you discuss any questions you have with your health care provider. Heart Failure Heart failure is a condition in which the heart has trouble pumping blood. This means your heart does not pump blood efficiently for your body to work well. In some cases of heart failure, fluid may back up into your lungs or you may have swelling (edema) in your lower legs. Heart failure is usually a long-term (chronic) condition. It is important for you to take good care of yourself and follow your health care provider's treatment plan. CAUSES  Some health conditions can cause heart failure. Those health conditions include:  High blood pressure (hypertension). Hypertension causes the heart muscle to work harder than normal. When pressure in the blood vessels is high, the heart needs to pump (contract) with more force in order to circulate blood throughout the body. High  blood pressure eventually causes the heart to become stiff and weak.  Coronary artery disease (CAD). CAD is the buildup of cholesterol and fat (plaque) in the arteries of the heart. The blockage in the arteries deprives the heart muscle of oxygen and blood. This can cause chest pain and may lead to a heart attack. High blood pressure can also contribute to CAD.  Heart attack (myocardial infarction). A heart attack occurs when one or more arteries in the heart become blocked. The loss of oxygen damages the muscle tissue of the heart. When this happens, part of the heart muscle dies. The injured tissue does not contract as well and weakens the heart's ability to pump blood.  Abnormal heart valves. When the heart valves do not open and close properly, it can cause heart failure. This makes the heart muscle pump harder to keep the blood flowing.  Heart muscle disease (cardiomyopathy or myocarditis). Heart muscle disease is damage to the heart muscle from a variety of causes. These can include drug or alcohol abuse, infections, or unknown reasons. These can increase the risk of heart failure.  Lung disease. Lung disease makes the heart work harder because the lungs do not work properly. This can cause a strain on the heart, leading it to fail.  Diabetes. Diabetes increases the risk of heart failure. High blood sugar contributes to high fat (lipid) levels in the blood. Diabetes can also cause slow damage to tiny blood vessels that carry important nutrients to the heart muscle. When the heart does not get enough oxygen and food, it can cause the heart to become weak and stiff. This leads to a heart that does not contract efficiently.  Other conditions can contribute to heart failure. These include abnormal heart rhythms, thyroid problems, and low blood counts (anemia). Certain unhealthy behaviors can increase the risk of heart failure, including:  Being overweight.  Smoking or chewing tobacco.  Eating  foods high in fat and cholesterol.  Abusing illicit drugs or alcohol.  Lacking physical activity. SYMPTOMS  Heart failure symptoms may vary and can be hard to detect. Symptoms may include:  Shortness of breath with activity,  such as climbing stairs.  Persistent cough.  Swelling of the feet, ankles, legs, or abdomen.  Unexplained weight gain.  Difficulty breathing when lying flat (orthopnea).  Waking from sleep because of the need to sit up and get more air.  Rapid heartbeat.  Fatigue and loss of energy.  Feeling light-headed, dizzy, or close to fainting.  Loss of appetite.  Nausea.  Increased urination during the night (nocturia). DIAGNOSIS  A diagnosis of heart failure is based on your history, symptoms, physical examination, and diagnostic tests. Diagnostic tests for heart failure may include:  Echocardiography.  Electrocardiography.  Chest X-ray.  Blood tests.  Exercise stress test.  Cardiac angiography.  Radionuclide scans. TREATMENT  Treatment is aimed at managing the symptoms of heart failure. Medicines, behavioral changes, or surgical intervention may be necessary to treat heart failure.  Medicines to help treat heart failure may include:  Angiotensin-converting enzyme (ACE) inhibitors. This type of medicine blocks the effects of a blood protein called angiotensin-converting enzyme. ACE inhibitors relax (dilate) the blood vessels and help lower blood pressure.  Angiotensin receptor blockers (ARBs). This type of medicine blocks the actions of a blood protein called angiotensin. Angiotensin receptor blockers dilate the blood vessels and help lower blood pressure.  Water pills (diuretics). Diuretics cause the kidneys to remove salt and water from the blood. The extra fluid is removed through urination. This loss of extra fluid lowers the volume of blood the heart pumps.  Beta blockers. These prevent the heart from beating too fast and improve heart muscle  strength.  Digitalis. This increases the force of the heartbeat.  Healthy behavior changes include:  Obtaining and maintaining a healthy weight.  Stopping smoking or chewing tobacco.  Eating heart-healthy foods.  Limiting or avoiding alcohol.  Stopping illicit drug use.  Physical activity as directed by your health care provider.  Surgical treatment for heart failure may include:  A procedure to open blocked arteries, repair damaged heart valves, or remove damaged heart muscle tissue.  A pacemaker to improve heart muscle function and control certain abnormal heart rhythms.  An internal cardioverter defibrillator to treat certain serious abnormal heart rhythms.  A left ventricular assist device (LVAD) to assist the pumping ability of the heart. HOME CARE INSTRUCTIONS   Take medicines only as directed by your health care provider. Medicines are important in reducing the workload of your heart, slowing the progression of heart failure, and improving your symptoms.  Do not stop taking your medicine unless directed by your health care provider.  Do not skip any dose of medicine.  Refill your prescriptions before you run out of medicine. Your medicines are needed every day.  Engage in moderate physical activity if directed by your health care provider. Moderate physical activity can benefit some people. The elderly and people with severe heart failure should consult with a health care provider for physical activity recommendations.  Eat heart-healthy foods. Food choices should be free of trans fat and low in saturated fat, cholesterol, and salt (sodium). Healthy choices include fresh or frozen fruits and vegetables, fish, lean meats, legumes, fat-free or low-fat dairy products, and whole grain or high fiber foods. Talk to a dietitian to learn more about heart-healthy foods.  Limit sodium if directed by your health care provider. Sodium restriction may reduce symptoms of heart  failure in some people. Talk to a dietitian to learn more about heart-healthy seasonings.  Use healthy cooking methods. Healthy cooking methods include roasting, grilling, broiling, baking, poaching, steaming, or stir-frying. Talk  to a dietitian to learn more about healthy cooking methods.  Limit fluids if directed by your health care provider. Fluid restriction may reduce symptoms of heart failure in some people.  Weigh yourself every day. Daily weights are important in the early recognition of excess fluid. You should weigh yourself every morning after you urinate and before you eat breakfast. Wear the same amount of clothing each time you weigh yourself. Record your daily weight. Provide your health care provider with your weight record.  Monitor and record your blood pressure if directed by your health care provider.  Check your pulse if directed by your health care provider.  Lose weight if directed by your health care provider. Weight loss may reduce symptoms of heart failure in some people.  Stop smoking or chewing tobacco. Nicotine makes your heart work harder by causing your blood vessels to constrict. Do not use nicotine gum or patches before talking to your health care provider.  Keep all follow-up visits as directed by your health care provider. This is important.  Limit alcohol intake to no more than 1 drink per day for nonpregnant women and 2 drinks per day for men. One drink equals 12 ounces of beer, 5 ounces of wine, or 1 ounces of hard liquor. Drinking more than that is harmful to your heart. Tell your health care provider if you drink alcohol several times a week. Talk with your health care provider about whether alcohol is safe for you. If your heart has already been damaged by alcohol or you have severe heart failure, drinking alcohol should be stopped completely.  Stop illicit drug use.  Stay up-to-date with immunizations. It is especially important to prevent respiratory  infections through current pneumococcal and influenza immunizations.  Manage other health conditions such as hypertension, diabetes, thyroid disease, or abnormal heart rhythms as directed by your health care provider.  Learn to manage stress.  Plan rest periods when fatigued.  Learn strategies to manage high temperatures. If the weather is extremely hot:  Avoid vigorous physical activity.  Use air conditioning or fans or seek a cooler location.  Avoid caffeine and alcohol.  Wear loose-fitting, lightweight, and light-colored clothing.  Learn strategies to manage cold temperatures. If the weather is extremely cold:  Avoid vigorous physical activity.  Layer clothes.  Wear mittens or gloves, a hat, and a scarf when going outside.  Avoid alcohol.  Obtain ongoing education and support as needed.  Participate in or seek rehabilitation as needed to maintain or improve independence and quality of life. SEEK MEDICAL CARE IF:   Your weight increases by 03 lb/1.4 kg in 1 day or 05 lb/2.3 kg in a week.  You have increasing shortness of breath that is unusual for you.  You are unable to participate in your usual physical activities.  You tire easily.  You cough more than normal, especially with physical activity.  You have any or more swelling in areas such as your hands, feet, ankles, or abdomen.  You are unable to sleep because it is hard to breathe.  You feel like your heart is beating fast (palpitations).  You become dizzy or light-headed upon standing up. SEEK IMMEDIATE MEDICAL CARE IF:   You have difficulty breathing.  There is a change in mental status such as decreased alertness or difficulty with concentration.  You have a pain or discomfort in your chest.  You have an episode of fainting (syncope). MAKE SURE YOU:   Understand these instructions.  Will watch your  condition.  Will get help right away if you are not doing well or get worse. Document  Released: 07/06/2005 Document Revised: 11/20/2013 Document Reviewed: 08/05/2012 [facility] Patient Information 2015 [place], [place]. This information is not intended to replace advice given to you by your health care provider. Make sure you discuss any questions you have with your health care provider. Heart Failure Heart failure is a condition in which the heart has trouble pumping blood. This means your heart does not pump blood efficiently for your body to work well. In some cases of heart failure, fluid may back up into your lungs or you may have swelling (edema) in your lower legs. Heart failure is usually a long-term (chronic) condition. It is important for you to take good care of yourself and follow your health care provider's treatment plan. CAUSES  Some health conditions can cause heart failure. Those health conditions include:  High blood pressure (hypertension). Hypertension causes the heart muscle to work harder than normal. When pressure in the blood vessels is high, the heart needs to pump (contract) with more force in order to circulate blood throughout the body. High blood pressure eventually causes the heart to become stiff and weak.  Coronary artery disease (CAD). CAD is the buildup of cholesterol and fat (plaque) in the arteries of the heart. The blockage in the arteries deprives the heart muscle of oxygen and blood. This can cause chest pain and may lead to a heart attack. High blood pressure can also contribute to CAD.  Heart attack (myocardial infarction). A heart attack occurs when one or more arteries in the heart become blocked. The loss of oxygen damages the muscle tissue of the heart. When this happens, part of the heart muscle dies. The injured tissue does not contract as well and weakens the heart's ability to pump blood.  Abnormal heart valves. When the heart valves do not open and close properly, it can cause heart failure. This makes the heart muscle pump harder to keep  the blood flowing.  Heart muscle disease (cardiomyopathy or myocarditis). Heart muscle disease is damage to the heart muscle from a variety of causes. These can include drug or alcohol abuse, infections, or unknown reasons. These can increase the risk of heart failure.  Lung disease. Lung disease makes the heart work harder because the lungs do not work properly. This can cause a strain on the heart, leading it to fail.  Diabetes. Diabetes increases the risk of heart failure. High blood sugar contributes to high fat (lipid) levels in the blood. Diabetes can also cause slow damage to tiny blood vessels that carry important nutrients to the heart muscle. When the heart does not get enough oxygen and food, it can cause the heart to become weak and stiff. This leads to a heart that does not contract efficiently.  Other conditions can contribute to heart failure. These include abnormal heart rhythms, thyroid problems, and low blood counts (anemia). Certain unhealthy behaviors can increase the risk of heart failure, including:  Being overweight.  Smoking or chewing tobacco.  Eating foods high in fat and cholesterol.  Abusing illicit drugs or alcohol.  Lacking physical activity. SYMPTOMS  Heart failure symptoms may vary and can be hard to detect. Symptoms may include:  Shortness of breath with activity, such as climbing stairs.  Persistent cough.  Swelling of the feet, ankles, legs, or abdomen.  Unexplained weight gain.  Difficulty breathing when lying flat (orthopnea).  Waking from sleep because of the need to sit  up and get more air.  Rapid heartbeat.  Fatigue and loss of energy.  Feeling light-headed, dizzy, or close to fainting.  Loss of appetite.  Nausea.  Increased urination during the night (nocturia). DIAGNOSIS  A diagnosis of heart failure is based on your history, symptoms, physical examination, and diagnostic tests. Diagnostic tests for heart failure may  include:  Echocardiography.  Electrocardiography.  Chest X-ray.  Blood tests.  Exercise stress test.  Cardiac angiography.  Radionuclide scans. TREATMENT  Treatment is aimed at managing the symptoms of heart failure. Medicines, behavioral changes, or surgical intervention may be necessary to treat heart failure.  Medicines to help treat heart failure may include:  Angiotensin-converting enzyme (ACE) inhibitors. This type of medicine blocks the effects of a blood protein called angiotensin-converting enzyme. ACE inhibitors relax (dilate) the blood vessels and help lower blood pressure.  Angiotensin receptor blockers (ARBs). This type of medicine blocks the actions of a blood protein called angiotensin. Angiotensin receptor blockers dilate the blood vessels and help lower blood pressure.  Water pills (diuretics). Diuretics cause the kidneys to remove salt and water from the blood. The extra fluid is removed through urination. This loss of extra fluid lowers the volume of blood the heart pumps.  Beta blockers. These prevent the heart from beating too fast and improve heart muscle strength.  Digitalis. This increases the force of the heartbeat.  Healthy behavior changes include:  Obtaining and maintaining a healthy weight.  Stopping smoking or chewing tobacco.  Eating heart-healthy foods.  Limiting or avoiding alcohol.  Stopping illicit drug use.  Physical activity as directed by your health care provider.  Surgical treatment for heart failure may include:  A procedure to open blocked arteries, repair damaged heart valves, or remove damaged heart muscle tissue.  A pacemaker to improve heart muscle function and control certain abnormal heart rhythms.  An internal cardioverter defibrillator to treat certain serious abnormal heart rhythms.  A left ventricular assist device (LVAD) to assist the pumping ability of the heart. HOME CARE INSTRUCTIONS   Take medicines only  as directed by your health care provider. Medicines are important in reducing the workload of your heart, slowing the progression of heart failure, and improving your symptoms.  Do not stop taking your medicine unless directed by your health care provider.  Do not skip any dose of medicine.  Refill your prescriptions before you run out of medicine. Your medicines are needed every day.  Engage in moderate physical activity if directed by your health care provider. Moderate physical activity can benefit some people. The elderly and people with severe heart failure should consult with a health care provider for physical activity recommendations.  Eat heart-healthy foods. Food choices should be free of trans fat and low in saturated fat, cholesterol, and salt (sodium). Healthy choices include fresh or frozen fruits and vegetables, fish, lean meats, legumes, fat-free or low-fat dairy products, and whole grain or high fiber foods. Talk to a dietitian to learn more about heart-healthy foods.  Limit sodium if directed by your health care provider. Sodium restriction may reduce symptoms of heart failure in some people. Talk to a dietitian to learn more about heart-healthy seasonings.  Use healthy cooking methods. Healthy cooking methods include roasting, grilling, broiling, baking, poaching, steaming, or stir-frying. Talk to a dietitian to learn more about healthy cooking methods.  Limit fluids if directed by your health care provider. Fluid restriction may reduce symptoms of heart failure in some people.  Weigh yourself every day. Daily  weights are important in the early recognition of excess fluid. You should weigh yourself every morning after you urinate and before you eat breakfast. Wear the same amount of clothing each time you weigh yourself. Record your daily weight. Provide your health care provider with your weight record.  Monitor and record your blood pressure if directed by your health care  provider.  Check your pulse if directed by your health care provider.  Lose weight if directed by your health care provider. Weight loss may reduce symptoms of heart failure in some people.  Stop smoking or chewing tobacco. Nicotine makes your heart work harder by causing your blood vessels to constrict. Do not use nicotine gum or patches before talking to your health care provider.  Keep all follow-up visits as directed by your health care provider. This is important.  Limit alcohol intake to no more than 1 drink per day for nonpregnant women and 2 drinks per day for men. One drink equals 12 ounces of beer, 5 ounces of wine, or 1 ounces of hard liquor. Drinking more than that is harmful to your heart. Tell your health care provider if you drink alcohol several times a week. Talk with your health care provider about whether alcohol is safe for you. If your heart has already been damaged by alcohol or you have severe heart failure, drinking alcohol should be stopped completely.  Stop illicit drug use.  Stay up-to-date with immunizations. It is especially important to prevent respiratory infections through current pneumococcal and influenza immunizations.  Manage other health conditions such as hypertension, diabetes, thyroid disease, or abnormal heart rhythms as directed by your health care provider.  Learn to manage stress.  Plan rest periods when fatigued.  Learn strategies to manage high temperatures. If the weather is extremely hot:  Avoid vigorous physical activity.  Use air conditioning or fans or seek a cooler location.  Avoid caffeine and alcohol.  Wear loose-fitting, lightweight, and light-colored clothing.  Learn strategies to manage cold temperatures. If the weather is extremely cold:  Avoid vigorous physical activity.  Layer clothes.  Wear mittens or gloves, a hat, and a scarf when going outside.  Avoid alcohol.  Obtain ongoing education and support as  needed.  Participate in or seek rehabilitation as needed to maintain or improve independence and quality of life. SEEK MEDICAL CARE IF:   Your weight increases by 03 lb/1.4 kg in 1 day or 05 lb/2.3 kg in a week.  You have increasing shortness of breath that is unusual for you.  You are unable to participate in your usual physical activities.  You tire easily.  You cough more than normal, especially with physical activity.  You have any or more swelling in areas such as your hands, feet, ankles, or abdomen.  You are unable to sleep because it is hard to breathe.  You feel like your heart is beating fast (palpitations).  You become dizzy or light-headed upon standing up. SEEK IMMEDIATE MEDICAL CARE IF:   You have difficulty breathing.  There is a change in mental status such as decreased alertness or difficulty with concentration.  You have a pain or discomfort in your chest.  You have an episode of fainting (syncope). MAKE SURE YOU:   Understand these instructions.  Will watch your condition.  Will get help right away if you are not doing well or get worse. Document Released: 07/06/2005 Document Revised: 11/20/2013 Document Reviewed: 08/05/2012 [facility] Patient Information 2015 [place], [place]. This information is not intended  to replace advice given to you by your health care provider. Make sure you discuss any questions you have with your health care provider.  

## 2014-10-17 ENCOUNTER — Other Ambulatory Visit: Payer: Self-pay | Admitting: Internal Medicine

## 2014-10-17 MED ORDER — LISINOPRIL 2.5 MG PO TABS: 2.5000 mg | ORAL_TABLET | Freq: Every day | ORAL | Status: DC

## 2014-10-22 ENCOUNTER — Other Ambulatory Visit: Payer: Self-pay | Admitting: Internal Medicine

## 2014-10-22 DIAGNOSIS — D869 Sarcoidosis, unspecified: Secondary | ICD-10-CM

## 2014-10-22 MED ORDER — PREDNISONE 10 MG PO TABS: 10.0000 mg | ORAL_TABLET | Freq: Every day | ORAL | Status: DC

## 2014-10-22 NOTE — Telephone Encounter (Signed)
Refill sent to pharmacy for prednisone. Thanks!

## 2014-11-17 ENCOUNTER — Other Ambulatory Visit: Payer: Self-pay | Admitting: Family Medicine

## 2014-11-20 ENCOUNTER — Telehealth: Payer: Self-pay | Admitting: Internal Medicine

## 2014-11-20 DIAGNOSIS — M25561 Pain in right knee: Secondary | ICD-10-CM

## 2014-11-21 NOTE — Telephone Encounter (Signed)
Refill request for Oxycodone '5mg'$ . LOV 10/03/2014. Please advise. Thanks!

## 2014-11-22 MED ORDER — OXYCODONE HCL 5 MG PO CAPS: 5.0000 mg | ORAL_CAPSULE | Freq: Four times a day (QID) | ORAL | Status: DC | PRN

## 2014-11-22 NOTE — Telephone Encounter (Signed)
Appointment will be scheduled when rx is picked up. Thanks!

## 2014-11-22 NOTE — Telephone Encounter (Signed)
Ms. Hallmon will need to schedule a 1 month follow-up for pain medication management. Reviewed [place] [facility] Reporting system prior to reorder, no inconsistencies noted.   Meds ordered this encounter  Medications  . oxycodone (OXY-IR) 5 MG capsule    Sig: Take 1 capsule (5 mg total) by mouth every 6 (six) hours as needed for pain.    Dispense:  60 capsule    Refill:  0    Hollis,Lachina M, FNP

## 2014-11-30 ENCOUNTER — Encounter: Payer: Self-pay | Admitting: Internal Medicine

## 2014-11-30 ENCOUNTER — Ambulatory Visit (INDEPENDENT_AMBULATORY_CARE_PROVIDER_SITE_OTHER): Payer: Medicare Other | Admitting: Internal Medicine

## 2014-11-30 VITALS — BP 124/80 | HR 72 | Ht 67.0 in | Wt 343.0 lb

## 2014-11-30 DIAGNOSIS — J45909 Unspecified asthma, uncomplicated: Secondary | ICD-10-CM | POA: Diagnosis not present

## 2014-11-30 DIAGNOSIS — I10 Essential (primary) hypertension: Secondary | ICD-10-CM

## 2014-11-30 DIAGNOSIS — J9611 Chronic respiratory failure with hypoxia: Secondary | ICD-10-CM

## 2014-11-30 MED ORDER — ALBUTEROL SULFATE HFA 108 (90 BASE) MCG/ACT IN AERS
2.0000 | INHALATION_SPRAY | Freq: Four times a day (QID) | RESPIRATORY_TRACT | Status: DC | PRN
Start: 2014-11-30 — End: 2016-04-28

## 2014-11-30 MED ORDER — MOMETASONE FURO-FORMOTEROL FUM 100-5 MCG/ACT IN AERO: INHALATION_SPRAY | RESPIRATORY_TRACT | Status: DC

## 2014-11-30 NOTE — Progress Notes (Signed)
Subjective:    Patient ID: Pamela [name], female    DOB: Aug 25, 1950  MRN: [id number]   Brief patient profile:  4 yobf never smoker with 1st asthma attack in 1990s and on maint rx since late 90's maint rx and freq courses of prednisone 3-4 x per year despite advair and spiriva and freq saba referred to pulmonary clinic 04/27/2014 by Dr [name] s/p CT Bx 06/25/11 > no path report > RT only per pt RUL    History of Present Illness  04/27/2014 1st [facility] office visit/ Pamela [name]   Chief Complaint  Patient presents with  . Pulmonary Consult    Referred by Dr. [name]. [name]  Pt recently moved to [place] from [name] and states needs to establish with new pulmonary MD.  She c/o DOE and cough, and states that she feels these symptoms are currently under control.    on prednisone 10 mg daily  since late July 2015 and still on freq saba and 2lpm  Already used   2puffs proair am of ov  rec Stop spiriva and advair Start dulera 100 Take 2 puffs first thing in am and then another 2 puffs about 12 hours later.  Work on inhaler technique:  relax and gently blow all the way out then take a nice smooth deep breath back in, triggering the inhaler at same time you start breathing in.  Hold for up to 5 seconds if you can.  Rinse and gargle with water when done Only use your albuterol (proair) as a rescue medication  If proair not helping, then use the neb and if needing the neb more than occastional, then take prednisone 10 mg daily  Please schedule a follow up office visit in 4 weeks, sooner if needed with all inhalers in hand for pfts on return   07/19/2014 f/u ov/Pamela [name] re: no inhalers x one week / on ACEi / did not bring inhalers, did not use this am Chief Complaint  Patient presents with  . Follow-up    Pt states that her breathing is doing well and denies any new co's today. PFT was done today.  Not limited by breathing from desired  activities  But by wt and couldn't tell any change on dulera/ out x one week and not using saba either  rec Only use your albuterol as a rescue medication   If you start needing your rescue inhaler more than twice weekly for cough/wheeze/ short of breath the first step will be to get a substitute for lisinopril and return here if not better to restart dulera 100 Take 2 puffs first thing in am and then another 2 puffs about 12 hours later.     11/30/2014 f/u ov/Pamela [name] re: mild intermittent asthma ? Related to weather / still on acei plus on prednisone 10 mg daily for gout  Chief Complaint  Patient presents with  . Acute Visit    Pt c/o increased SOB for the past month. She is also coughing and wheezing. Cough is prod with minimal yellow to clear sputum. She uses proair 2 x daily on average.   Also due for CT chest to f/u dx of lung ca dx March 2013  never surgery/ RT only   Onset of dry cough / wheeze was insidious/ pattern is progressively worse day >> noct with increasing  need for saba but very poor hfa   No obvious patterns in day to  day or daytime variabilty or assoc  cp or chest tightness,   overt sinus or hb symptoms. No unusual exp hx or h/o childhood pna/ asthma or knowledge of premature birth.  Sleeping ok without nocturnal  or early am exacerbation  of respiratory  c/o's or need for noct saba. Also denies any obvious fluctuation of symptoms with weather or environmental changes or other aggravating or alleviating factors except as outlined above   Current Medications, Allergies, Complete Past Medical History, Past Surgical History, Family History, and Social History were reviewed in [employer] record.  ROS  The following are not active complaints unless bolded sore throat, dysphagia, dental problems, itching, sneezing,  nasal congestion or excess/ purulent secretions, ear ache,   fever, chills, sweats, unintended wt loss, pleuritic or exertional cp, hemoptysis,   orthopnea pnd or leg swelling, presyncope, palpitations, heartburn, abdominal pain, anorexia, nausea, vomiting, diarrhea  or change in bowel or urinary habits, change in stools or urine, dysuria,hematuria,  rash, arthralgias, visual complaints, headache, numbness weakness or ataxia or problems with walking or coordination,  change in mood/affect or memory.           Objective:   Physical Exam  amb massively obese  Bf does not want to get up on table due to wt     11/30/2014        343 Wt Readings from Last 3 Encounters:  07/19/14 349 lb (158.305 kg)  07/05/14 354 lb (160.573 kg)  06/14/14 346 lb 11.2 oz (157.262 kg)    Vital signs reviewed      HEENT: nl dentition, turbinates, and orophanx. Nl external ear canals without cough reflex   NECK :  without JVD/Nodes/TM/ nl carotid upstrokes bilaterally   LUNGS: no acc muscle use, clear to A and P bilaterally without cough on insp or exp maneuvers/ prominent pseudowheeze only    CV:  RRR  no s3 or murmur or increase in P2    ABD:  soft and nontender with limited excursion.    MS:  warm without deformities, calf tenderness, cyanosis or clubbing  SKIN: warm and dry without lesions    Ext: wrapped/ bilateral sym swelling         CXR PA and Lateral:   07/19/2014 :  Improvement in linear opacity in the right upper lobe anteriorly most consistent with linear atelectasis.      Assessment & Plan:

## 2014-11-30 NOTE — Patient Instructions (Addendum)
Stop lisinopril and start valsartan 80 mg one daily instead   Only use your albuterol as a rescue medication to be used if you can't catch your breath by resting or doing a relaxed purse lip breathing pattern.  - The less you use it, the better it will work when you need it. - Ok to use up to 2 puffs  every 4 hours if you must but call for immediate appointment if use goes up over your usual need - Don't leave home without it !!  (think of it like the spare tire for your car)    Restart dulera 100 Take 2 puffs first thing in am and then another 2 puffs about 12 hours later until you only need  your rescue inhaler no more than twice weekly for cough/wheeze/ short of breath   Follow up with Dr [name] as planned and only see me if needed

## 2014-12-01 ENCOUNTER — Encounter: Payer: Self-pay | Admitting: Internal Medicine

## 2014-12-01 NOTE — Assessment & Plan Note (Signed)
ACE inhibitors are problematic in  pts with airway complaints because  even experienced pulmonologists can't always distinguish ace effects from copd/asthma/pnds/ allergies etc.  By themselves they don't actually cause a problem, much like oxygen can't by itself start a fire, but they certainly serve as a powerful catalyst or enhancer for any "fire"  or inflammatory process in the upper airway, be it caused by an ET  tube or more commonly reflux (especially in the obese or pts with known GERD or who are on biphoshonates) or URI's, due to interference with bradykinin clearance.  The effects of acei on bradykinin levels occurs in 100% of pt's on acei (unless they surreptitiously stop the med!) but the classic cough is only reported in 5%.  This leaves 95% of pts on acei's  with a variety of syndromes including no identifiable symptom in most  vs non-specific symptoms that wax and wane depending on what other insult is occuring at the level of the upper airway.   For now rec try off acei and on valsartan 80 mg daily

## 2014-12-01 NOTE — Assessment & Plan Note (Addendum)
pfts 07/31/13  FEV1  1.53 (58%) with ratio 66 > p saba ratio 74 FEV1 1.68 (64%)  -04/27/2014 p extensive coaching HFA effectiveness =    75% > try dulera 100 2 bid  - PFT's 07/19/2014 FEV1 2.00 (93%)  and ratio 85 after 21% improvement from saba with no inhalers x one week    Symptoms are markedly disproportionate to objective findings and not clear this is a lung problem but pt does appear to have difficult airway management issues. DDX of  difficult airways management all start with A and  include Adherence, Ace Inhibitors, Acid Reflux, Active Sinus Disease, Alpha 1 Antitripsin deficiency, Anxiety masquerading as Airways dz,  ABPA,  allergy(esp in young), Aspiration (esp in elderly), Adverse effects of DPI,  Active smokers, plus two Bs  = Bronchiectasis and Beta blocker use..and one C= CHF  Adherence is always the initial "prime suspect" and is a multilayered concern that requires a "trust but verify" approach in every patient - starting with knowing how to use medications, especially inhalers, correctly, keeping up with refills and understanding the fundamental difference between maintenance and prns vs those medications only taken for a very short course and then stopped and not refilled.  The proper method of use, as well as anticipated side effects, of a metered-dose inhaler are discussed and demonstrated to the patient. Improved effectiveness after extensive coaching during this visit to a level of approximately  75% from a baseline of 25%  ACEi also at top of list > would not use this in setting of difficult to control non-specific airway  Complaints as too much overlap from acei efffects > see hbp  ? Allergy/ difficult to believe if really on a maint rx of pred 10 mg daily for gout > defer taper to Dr [name]   ? Acid (or non-acid) GERD > always difficult to exclude as up to 75% of pts in some series report no assoc GI/ Heartburn symptoms> rec consider adding rx if not better off acei    I  had an extended discussion with the patient reviewing all relevant studies completed to date and  lasting 15 to 20 minutes of a 25 minute visit on the following ongoing concerns:   1) rule of 2's reviewed > if not able to reduce the saba to twice weekly then should step up to dulera 100 2bid  2) Each maintenance medication was reviewed in detail including most importantly the difference between maintenance and as needed and under what circumstances the prns are to be used.  Please see instructions for details which were reviewed in writing and the patient given a copy.    3) pulmonary f/u is prn

## 2014-12-01 NOTE — Assessment & Plan Note (Signed)
Adequate control on present rx, reviewed > no change in rx needed  Until substantial wt loss occurs

## 2014-12-24 ENCOUNTER — Encounter (HOSPITAL_COMMUNITY): Payer: Self-pay | Admitting: Emergency Medicine

## 2014-12-24 ENCOUNTER — Emergency Department (HOSPITAL_COMMUNITY)
Admission: EM | Admit: 2014-12-24 | Discharge: 2014-12-25 | Disposition: A | Discharge status: Home / Self Care | Payer: Medicare Other | Attending: Emergency Medicine | Admitting: Emergency Medicine

## 2014-12-24 ENCOUNTER — Ambulatory Visit: Payer: Medicare Other | Admitting: Family Medicine

## 2014-12-24 DIAGNOSIS — J449 Chronic obstructive pulmonary disease, unspecified: Secondary | ICD-10-CM | POA: Insufficient documentation

## 2014-12-24 DIAGNOSIS — M1A00X0 Idiopathic chronic gout, unspecified site, without tophus (tophi): Secondary | ICD-10-CM | POA: Diagnosis not present

## 2014-12-24 DIAGNOSIS — I509 Heart failure, unspecified: Secondary | ICD-10-CM | POA: Diagnosis not present

## 2014-12-24 DIAGNOSIS — M25561 Pain in right knee: Secondary | ICD-10-CM

## 2014-12-24 DIAGNOSIS — D869 Sarcoidosis, unspecified: Secondary | ICD-10-CM

## 2014-12-24 DIAGNOSIS — I10 Essential (primary) hypertension: Secondary | ICD-10-CM | POA: Diagnosis not present

## 2014-12-24 DIAGNOSIS — M1000 Idiopathic gout, unspecified site: Secondary | ICD-10-CM | POA: Insufficient documentation

## 2014-12-24 DIAGNOSIS — M79601 Pain in right arm: Secondary | ICD-10-CM | POA: Diagnosis present

## 2014-12-24 DIAGNOSIS — M109 Gout, unspecified: Secondary | ICD-10-CM

## 2014-12-24 NOTE — ED Notes (Signed)
Pt states last Monday her right elbow started having pain then on Thursday her right hand started hurting and swelling  Pt states over the weekend she started having pain in her knees and right foot and today her left elbow started hurting

## 2014-12-25 ENCOUNTER — Ambulatory Visit: Payer: Medicare Other | Admitting: Family Medicine

## 2014-12-25 MED ORDER — PREDNISONE 20 MG PO TABS
40.0000 mg | ORAL_TABLET | Freq: Once | ORAL | Status: AC
  Administered 2014-12-25: 40 mg via ORAL
  Filled 2014-12-25: qty 2

## 2014-12-25 MED ORDER — HYDROMORPHONE HCL 1 MG/ML IJ SOLN
1.0000 mg | Freq: Once | INTRAMUSCULAR | Status: AC
  Administered 2014-12-25: 1 mg via INTRAMUSCULAR
  Filled 2014-12-25: qty 1

## 2014-12-25 MED ORDER — PREDNISONE 10 MG PO TABS: 10.0000 mg | ORAL_TABLET | Freq: Every day | ORAL | Status: DC

## 2014-12-25 MED ORDER — OXYCODONE HCL 5 MG PO CAPS: 5.0000 mg | ORAL_CAPSULE | Freq: Four times a day (QID) | ORAL | Status: DC | PRN

## 2014-12-25 MED ORDER — PREDNISONE 20 MG PO TABS: 40.0000 mg | ORAL_TABLET | Freq: Every day | ORAL | Status: DC

## 2014-12-25 NOTE — Discharge Instructions (Signed)
You have been give prescriptions for an increased dosage of Prednisone  Please take as directed for the full 5 days than resume you normal Prednisone dosing.  You have also been given Percocet for pain control Make an appointment with your PCP for follow up evaluation

## 2014-12-31 ENCOUNTER — Telehealth: Payer: Self-pay | Admitting: *Deleted

## 2014-12-31 NOTE — Telephone Encounter (Signed)
lmomtcb x1 

## 2014-12-31 NOTE — Telephone Encounter (Signed)
symbicort 80 2bid is same drug

## 2014-12-31 NOTE — Telephone Encounter (Signed)
Pamela [name] is non-formulary and requires PA Patient must have tried and failed at least 3: Advair Diskus, Advair HFA, Breo and Symbicort.  Patient has a history of failure of Advair/Spiriva.

## 2015-01-01 NOTE — Telephone Encounter (Signed)
Patient aware of medication change. Patient made aware of directions 2 puffs bid.  Pt informed of importance of rinsing mouth after each use. Nothing further needed.

## 2015-01-02 ENCOUNTER — Other Ambulatory Visit (HOSPITAL_COMMUNITY)
Admission: RE | Admit: 2015-01-02 | Discharge: 2015-01-02 | Disposition: A | Discharge status: Home / Self Care | Payer: Medicare Other | Source: Ambulatory Visit | Attending: Internal Medicine | Admitting: Internal Medicine

## 2015-01-02 ENCOUNTER — Encounter: Payer: Self-pay | Admitting: Family Medicine

## 2015-01-02 ENCOUNTER — Ambulatory Visit (INDEPENDENT_AMBULATORY_CARE_PROVIDER_SITE_OTHER): Payer: Medicare Other | Admitting: Family Medicine

## 2015-01-02 VITALS — BP 136/70 | HR 89 | Temp 98.5°F | Resp 18 | Ht 67.0 in | Wt 351.0 lb

## 2015-01-02 DIAGNOSIS — I5032 Chronic diastolic (congestive) heart failure: Secondary | ICD-10-CM | POA: Diagnosis not present

## 2015-01-02 DIAGNOSIS — Z124 Encounter for screening for malignant neoplasm of cervix: Secondary | ICD-10-CM | POA: Diagnosis not present

## 2015-01-02 DIAGNOSIS — R7303 Prediabetes: Secondary | ICD-10-CM

## 2015-01-02 DIAGNOSIS — Z23 Encounter for immunization: Secondary | ICD-10-CM

## 2015-01-02 DIAGNOSIS — L600 Ingrowing nail: Secondary | ICD-10-CM

## 2015-01-02 DIAGNOSIS — I10 Essential (primary) hypertension: Secondary | ICD-10-CM

## 2015-01-02 DIAGNOSIS — Z1231 Encounter for screening mammogram for malignant neoplasm of breast: Secondary | ICD-10-CM

## 2015-01-02 DIAGNOSIS — Z0000 Encounter for general adult medical examination without abnormal findings: Secondary | ICD-10-CM | POA: Diagnosis not present

## 2015-01-02 DIAGNOSIS — R7309 Other abnormal glucose: Secondary | ICD-10-CM | POA: Diagnosis not present

## 2015-01-02 DIAGNOSIS — Z85118 Personal history of other malignant neoplasm of bronchus and lung: Secondary | ICD-10-CM

## 2015-01-02 DIAGNOSIS — M2550 Pain in unspecified joint: Secondary | ICD-10-CM

## 2015-01-02 DIAGNOSIS — M25561 Pain in right knee: Secondary | ICD-10-CM

## 2015-01-02 LAB — POCT URINALYSIS DIP (DEVICE)
Bilirubin Urine: NEGATIVE
GLUCOSE, UA: NEGATIVE mg/dL
HGB URINE DIPSTICK: NEGATIVE
KETONES UR: NEGATIVE mg/dL
Leukocytes, UA: NEGATIVE
Nitrite: NEGATIVE
Protein, ur: NEGATIVE mg/dL
SPECIFIC GRAVITY, URINE: 1.015 (ref 1.005–1.030)
Urobilinogen, UA: 0.2 mg/dL (ref 0.0–1.0)
pH: 7 (ref 5.0–8.0)

## 2015-01-02 LAB — HEMOGLOBIN A1C
HEMOGLOBIN A1C: 5.9 % — AB (ref <5.7)
Mean Plasma Glucose: 123 mg/dL — ABNORMAL HIGH (ref <117)

## 2015-01-02 LAB — COMPLETE METABOLIC PANEL WITH GFR
ALK PHOS: 361 U/L — AB (ref 39–117)
ALT: 54 U/L — ABNORMAL HIGH (ref 0–35)
AST: 27 U/L (ref 0–37)
Albumin: 3.3 g/dL — ABNORMAL LOW (ref 3.5–5.2)
BUN: 24 mg/dL — AB (ref 6–23)
CALCIUM: 8.5 mg/dL (ref 8.4–10.5)
CHLORIDE: 100 meq/L (ref 96–112)
CO2: 26 meq/L (ref 19–32)
CREATININE: 1.23 mg/dL — AB (ref 0.50–1.10)
GFR, Est African American: 54 mL/min — ABNORMAL LOW
GFR, Est Non African American: 47 mL/min — ABNORMAL LOW
Glucose, Bld: 98 mg/dL (ref 70–99)
Potassium: 4.2 mEq/L (ref 3.5–5.3)
Sodium: 139 mEq/L (ref 135–145)
Total Bilirubin: 0.3 mg/dL (ref 0.2–1.2)
Total Protein: 6.6 g/dL (ref 6.0–8.3)

## 2015-01-02 MED ORDER — OXYCODONE HCL 5 MG PO CAPS: 5.0000 mg | ORAL_CAPSULE | Freq: Four times a day (QID) | ORAL | Status: DC | PRN

## 2015-01-02 NOTE — Progress Notes (Signed)
ANNUAL PREVENTATIVE VISIT AND CPE  Subjective:  Pamela [name] is a 64 y.o. female who presents for Annual Wellness Visit and complete physical.  Date of last wellness visit was several years ago.   She has had hypertension for several  years. Her blood pressure has been controlled at home, today their BP is BP: 136/70 mmHg, which is at goal.  She does not workout. She denies chest pain, shortness of breath, dizziness.  She is on cholesterol medication and denies myalgias. Her cholesterol is at goal. The cholesterol last visit was:   Lab Results  Component Value Date   CHOL 171 04/13/2014   HDL 59 04/13/2014   LDLCALC 89 04/13/2014   TRIG 116 04/13/2014   CHOLHDL 2.9 04/13/2014   She has had pre-diabetes for years. She  has been working on diet and exercise for prediabetes, and denies nausea, paresthesia of the feet, polydipsia, polyuria, visual disturbances, vomiting and weight loss. Last A1C in the office was:  Lab Results  Component Value Date   HGBA1C 5.9* 07/05/2014   Immunization History  Administered Date(s) Administered  . Influenza,inj,Quad PF,36+ Mos 04/13/2014  . Pneumococcal Polysaccharide-23 04/13/2014  . Zoster 05/21/2014   Patient is on Vitamin D supplement.   No results found for: VD25OH     Names of Other Physician/Practitioners you currently use: 1. [facility] here for primary care Patient Care Team: Leana Gamer, MD as PCP - General (Internal Medicine)   Medication Review: Current Outpatient Prescriptions on File Prior to Visit  Medication Sig Dispense Refill  . albuterol (PROAIR HFA) 108 (90 BASE) MCG/ACT inhaler Inhale 2 puffs into the lungs every 6 (six) hours as needed for wheezing or shortness of breath. 1 Inhaler 1  . aspirin 81 MG tablet Take 81 mg by mouth daily.    . Calcium Carbonate-Vitamin D (CALTRATE 600+D PO) Take 1 tablet by mouth daily.     [name] Kitchen docusate sodium (COLACE) 100 MG capsule Take 100 mg by mouth 2 (two)  times daily.    . febuxostat (ULORIC) 40 MG tablet Take 1 tablet (40 mg total) by mouth daily. 90 tablet 0  . ferrous sulfate 325 (65 FE) MG tablet Take 1 tablet (325 mg total) by mouth daily with breakfast. 90 tablet 0  . furosemide (LASIX) 20 MG tablet Take 1 tablet (20 mg total) by mouth 2 (two) times daily. 180 tablet 0  . mometasone-formoterol (DULERA) 100-5 MCG/ACT AERO Take 2 puffs first thing in am and then another 2 puffs about 12 hours later. 1 Inhaler 2  . oxycodone (OXY-IR) 5 MG capsule Take 1 capsule (5 mg total) by mouth every 6 (six) hours as needed for pain. 30 capsule 0  . predniSONE (DELTASONE) 10 MG tablet Take 1 tablet (10 mg total) by mouth daily with breakfast. 30 tablet 2  . predniSONE (DELTASONE) 20 MG tablet Take 2 tablets (40 mg total) by mouth daily with breakfast. 10 tablet 0  . senna-docusate (SENOKOT-S) 8.6-50 MG per tablet Take 1 tablet by mouth 2 (two) times daily. (Patient not taking: Reported on 12/25/2014)     No current facility-administered medications on file prior to visit.    Current Problems (verified) Patient Active Problem List   Diagnosis Date Noted  . Chronic respiratory failure with hypoxia 07/19/2014  . CHF (congestive heart failure) 06/11/2014  . Anemia, iron deficiency 05/27/2014  . Acute gouty arthritis 05/25/2014  . Acute renal failure superimposed on stage 3 chronic kidney disease 05/25/2014  .  Obstructive sleep apnea 05/25/2014  . Joint pain 05/24/2014  . Chronic diastolic CHF (congestive heart failure)   . Sarcoidosis   . Metabolic syndrome 62/95/2841  . Asthma, chronic 04/27/2014  . Anemia of chronic disease 04/19/2014  . Morbid obesity 04/18/2014  . On home oxygen therapy 04/18/2014  . Immunization due 04/18/2014  . Need for prophylactic vaccination and inoculation against influenza 04/18/2014  . Prediabetes 04/13/2014  . Essential hypertension 04/13/2014  . Lower extremity edema 04/13/2014  . History of lung cancer 04/13/2014     Screening Tests Health Maintenance  Topic Date Due  . PAP SMEAR  11/07/1970  . TETANUS/TDAP  11/07/1971  . MAMMOGRAM  11/07/2002  . COLONOSCOPY  11/07/2002  . INFLUENZA VACCINE  02/18/2015  . ZOSTAVAX  Completed  . HIV Screening  Completed    Immunization History  Administered Date(s) Administered  . Influenza,inj,Quad PF,36+ Mos 04/13/2014  . Pneumococcal Polysaccharide-23 04/13/2014  . Zoster 05/21/2014    Preventative care: Last colonoscopy: Patient was 89 Last mammogram: November 2014 Last pap smear/pelvic exam: Last pap smear over 10 years ago DEXA: Never, will send referral   Prior vaccinations: TD or Tdap: Tdap 01/02/2015  IThis medication is not covered by your insurance if dispensed from your Physician's office. You can obtain your vaccine at an area Pharmacy.    Medication List       This list is accurate as of: 01/02/15 10:57 AM.  Always use your most recent med list.               albuterol 108 (90 BASE) MCG/ACT inhaler  Commonly known as:  PROAIR HFA  Inhale 2 puffs into the lungs every 6 (six) hours as needed for wheezing or shortness of breath.     aspirin 81 MG tablet  Take 81 mg by mouth daily.     CALTRATE 600+D PO  Take 1 tablet by mouth daily.     docusate sodium 100 MG capsule  Commonly known as:  COLACE  Take 100 mg by mouth 2 (two) times daily.     febuxostat 40 MG tablet  Commonly known as:  ULORIC  Take 1 tablet (40 mg total) by mouth daily.     ferrous sulfate 325 (65 FE) MG tablet  Take 1 tablet (325 mg total) by mouth daily with breakfast.     furosemide 20 MG tablet  Commonly known as:  LASIX  Take 1 tablet (20 mg total) by mouth 2 (two) times daily.     mometasone-formoterol 100-5 MCG/ACT Aero  Commonly known as:  DULERA  Take 2 puffs first thing in am and then another 2 puffs about 12 hours later.     oxycodone 5 MG capsule  Commonly known as:  OXY-IR  Take 1 capsule (5 mg total) by mouth every 6 (six) hours as  needed for pain.     predniSONE 20 MG tablet  Commonly known as:  DELTASONE  Take 2 tablets (40 mg total) by mouth daily with breakfast.     predniSONE 10 MG tablet  Commonly known as:  DELTASONE  Take 1 tablet (10 mg total) by mouth daily with breakfast.     senna-docusate 8.6-50 MG per tablet  Commonly known as:  Senokot-S  Take 1 tablet by mouth 2 (two) times daily.        Past Surgical History  Procedure Laterality Date  . Lung biopsy    . Tubal ligation    . Vein ligation and stripping  Family History  Problem Relation Age of Onset  . Hypertension Mother   . Renal Disease Mother   . Cancer Father   . Heart disease Father     No details  . Cancer Brother   . Diabetes Brother   . Cervical cancer Sister   . Diabetes Sister   . Multiple myeloma Sister     History reviewed: allergies, current medications, past family history, past medical history, past social history, past surgical history and problem list   Risk Factors: Osteoporosis/FallRisk:  In the past year have you fallen or had a near fall?: No History of fracture in the past year: No  Tobacco History  Substance Use Topics  . Smoking status: Never Smoker   . Smokeless tobacco: Never Used  . Alcohol Use: No   She does not smoke.  Patient is not a former smoker. Are there smokers in your home (other than you)? No  Alcohol Current alcohol use: none  Caffeine Current caffeine use: tea 1-2 /day  Exercise Current exercise: none  Nutrition/Diet Current diet: in general, a "healthy" diet    Cardiac risk factors: diabetes mellitus, dyslipidemia, obesity (BMI >= 30 kg/m2) and sedentary lifestyle.  Depression Screen (Note: if answer to either of the following is "Yes", a more complete depression screening is indicated)   Q1: Over the past two weeks, have you felt down, depressed or hopeless? No  Q2: Over the past two weeks, have you felt little interest or pleasure in doing things? No  Have you  lost interest or pleasure in daily life? No  Do you often feel hopeless? No  Do you cry easily over simple problems? {  Activities of Daily Living In your present state of health, do you have any difficulty performing the following activities?:  Driving? Patient no longer drives Managing money?  No Feeding yourself? No Getting from bed to chair? No Climbing a flight of stairs? No Preparing food and eating?: No Bathing or showering? No Getting dressed: No Getting to the toilet? No Using the toilet: No Moving around from place to place: No In the past year have you fallen or had a near fall?:No  Are you sexually active? No   Vision Difficulties: Patient wear bi-focals  Hearing Difficulties:  Do you often ask people to speak up or repeat themselves? No Do you experience ringing or noises in your ears? No Do you have difficulty understanding soft or whispered voices? No  Cognition  Do you feel that you have a problem with memory?No  Do you often misplace items? No  Do you feel safe at home? Yes Advanced directives Does patient have a [place]? No Does patient have a Living Will? No   Objective:     Blood pressure 140/68, pulse 89, temperature 98.5 F (36.9 C), temperature source Oral, resp. rate 18, height '5\' 7"'  (1.702 m), weight 351 lb (159.213 kg), SpO2 100 %. Body mass index is 54.96 kg/(m^2).  General appearance: alert, no distress, WD/WN, female Cognitive Testing  Alert? Yes  Normal Appearance?Yes  Oriented to person? Yes  Place? Yes   Time? Yes  Recall of three objects?  Yes  Can perform simple calculations? Yes  Displays appropriate judgment?Yes  Can read the correct time from a watch face?Yes  HEENT: normocephalic, sclerae anicteric, TMs pearly, nares patent, no discharge or erythema, pharynx normal Oral cavity: MMM, no lesions Neck: supple, no lymphadenopathy, no thyromegaly, no masses Heart: RRR, normal S1, S2, no murmurs  Lungs: CTA  bilaterally, no wheezes, rhonchi, or rales Abdomen: +bs, soft, non tender, non distended, no masses, no hepatomegaly, no splenomegaly Musculoskeletal: nontender, no swelling, no obvious deformity Extremities: no edema, no cyanosis, no clubbing Pulses: 2+ symmetric, upper and lower extremities, normal cap refill Neurological: alert, oriented x 3, CN2-12 intact, strength normal upper extremities and lower extremities, sensation normal throughout, DTRs 2+ throughout, no cerebellar signs, gait normal Psychiatric: normal affect, behavior normal, pleasant   Assessment:  Patient denies any difficulties at home. No trouble with ADLs, depression or falls.  She states that she climbs 14 stairs per day in home.  No recent changes to vision or hearing.  Is UTD with immunizations. Is UTD with screening.  Discussed Advanced Directives, provided information.  Encouraged heart healthy diet, exercise as tolerated and adequate sleep. Declines flu shot. Pap smear done today.   BP 136/70 mmHg  Pulse 89  Temp(Src) 98.5 F (36.9 C) (Oral)  Resp 18  Ht '5\' 7"'  (1.702 m)  Wt 351 lb (159.213 kg)  BMI 54.96 kg/m2  SpO2 100%  Plan:  1. Annual physical exam Patient is partially edentulous, she has not been followed by a dentist in several years. Patient states that she is up to date with colonoscopy. She states that she will call me back with date of previous colonoscopy. Reviewed previous EKG and echocardiogram results from November 2015. Patient reports that she has never had a bone density scan, will order. Patient given information on advance directives.  Patient is currently on 2 liters of oxygen, 6 minute walk test revealed 98% on 2 Liters and 83% on room air.  - Ambulatory referral to Ophthalmology - Cytology - PAP [place]  2. Essential hypertension Blood pressutre is at goal on current medications. No proteinuria present.  - COMPLETE METABOLIC PANEL WITH GFR - POCT urinalysis dipstick -  Microalbumin/Creatinine Ratio, Urine  3. Prediabetes Reviewed previous hemoglobin A1C, 5.9. Patient states that she follows a balanced diet but does not exercise regimen.  - Hemoglobin A1c  4. Chronic diastolic CHF (congestive heart failure) Patient is currently not on ACE inhibitor, she was previously taken off by pulmonologist. Patient is not currently on a Beta blocker or CCB for reasons unknown. She also does not know whether she has systolic or diastolic dysfunction.   She is currently on Furosemide 20 mg BID.   5. History of lung cancer Reviewed previous notes from previous surveillance CT scans from [place], [name]. Patient is requesting oncology follow up. Will contact oncologist to inquire about yearly surveillance.   7. Morbid obesity Ms. Kunesh states that she is eating a balanced diet divided over 3-4 meals throughout the day. She is currently not exercising and has minimal activity.   8. Ingrowing toenail Patient has bilateral great toe ingrown nails with mild discoloration. Will send a referral to podiatry.  - Ambulatory referral to Podiatry  9. Need for Tdap vaccination - Tdap vaccine greater than or equal to 7yo IM  10. Screening mammogram for high-risk patient - MM DIGITAL SCREENING BILATERAL; Future During the course of the visit the patient was educated and counseled about appropriate screening and preventive services including:    Pneumococcal vaccine   Influenza vaccine  Td vaccine  Screening electrocardiogram  Bone densitometry screening  Colorectal cancer screening  Diabetes screening  Glaucoma screening  Nutrition counseling   Advanced directives: requested  Screening recommendations, referrals: Vaccinations: Please see documentation below and orders this visit. Nutrition assessed and recommended  Colonoscopy not  indicated Recommended yearly ophthalmology/optometry visit for glaucoma screening and checkup Recommended yearly dental visit  for hygiene and checkup Advanced directives - Information provided  Conditions/risks identified: BMI: Discussed weight loss, diet, and increase physical activity.  Increase physical activity: AHA recommends 150 minutes of physical activity a week.  Medications reviewed Fall risk: Patient is a moderate fall risk   Medicare Attestation I have personally reviewed: The patient's medical and social history Their use of alcohol, tobacco or illicit drugs Their current medications and supplements The patient's functional ability including ADLs,fall risks, home safety risks, cognitive, and hearing and visual impairment Diet and physical activities Evidence for depression or mood disorders  The patient's weight, height, BMI, and visual acuity have been recorded in the chart.  I have made referrals, counseling, and provided education to the patient based on review of the above and I have provided the patient with a written personalized care plan for preventive services.     Amirrah Quigley Jerilynn Mages, FNP   01/02/2015

## 2015-01-02 NOTE — Patient Instructions (Signed)
Health Maintenance Adopting a healthy lifestyle and getting preventive care can go a long way to promote health and wellness. Talk with your health care provider about what schedule of regular examinations is right for you. This is a good chance for you to check in with your provider about disease prevention and staying healthy. In between checkups, there are plenty of things you can do on your own. Experts have done a lot of research about which lifestyle changes and preventive measures are most likely to keep you healthy. Ask your health care provider for more information. WEIGHT AND DIET  Eat a healthy diet  Be sure to include plenty of vegetables, fruits, low-fat dairy products, and lean protein.  Do not eat a lot of foods high in solid fats, added sugars, or salt.  Get regular exercise. This is one of the most important things you can do for your health.  Most adults should exercise for at least 150 minutes each week. The exercise should increase your heart rate and make you sweat (moderate-intensity exercise).  Most adults should also do strengthening exercises at least twice a week. This is in addition to the moderate-intensity exercise.  Maintain a healthy weight  Body mass index (BMI) is a measurement that can be used to identify possible weight problems. It estimates body fat based on height and weight. Your health care provider can help determine your BMI and help you achieve or maintain a healthy weight.  For females 25 years of age and older:   A BMI below 18.5 is considered underweight.  A BMI of 18.5 to 24.9 is normal.  A BMI of 25 to 29.9 is considered overweight.  A BMI of 30 and above is considered obese.  Watch levels of cholesterol and blood lipids  You should start having your blood tested for lipids and cholesterol at 62 years of age, then have this test every 5 years.  You may need to have your cholesterol levels checked more often if:  Your lipid or  cholesterol levels are high.  You are older than 62 years of age.  You are at high risk for heart disease.  CANCER SCREENING   Lung Cancer  Lung cancer screening is recommended for adults 97-92 years old who are at high risk for lung cancer because of a history of smoking.  A yearly low-dose CT scan of the lungs is recommended for people who:  Currently smoke.  Have quit within the past 15 years.  Have at least a 30-pack-year history of smoking. A pack year is smoking an average of one pack of cigarettes a day for 1 year.  Yearly screening should continue until it has been 15 years since you quit.  Yearly screening should stop if you develop a health problem that would prevent you from having lung cancer treatment.  Breast Cancer  Practice breast self-awareness. This means understanding how your breasts normally appear and feel.  It also means doing regular breast self-exams. Let your health care provider know about any changes, no matter how small.  If you are in your 20s or 30s, you should have a clinical breast exam (CBE) by a health care provider every 1-3 years as part of a regular health exam.  If you are 76 or older, have a CBE every year. Also consider having a breast X-ray (mammogram) every year.  If you have a family history of breast cancer, talk to your health care provider about genetic screening.  If you are  at high risk for breast cancer, talk to your health care provider about having an MRI and a mammogram every year.  Breast cancer gene (BRCA) assessment is recommended for women who have family members with BRCA-related cancers. BRCA-related cancers include:  Breast.  Ovarian.  Tubal.  Peritoneal cancers.  Results of the assessment will determine the need for genetic counseling and BRCA1 and BRCA2 testing. Cervical Cancer Routine pelvic examinations to screen for cervical cancer are no longer recommended for nonpregnant women who are considered low  risk for cancer of the pelvic organs (ovaries, uterus, and vagina) and who do not have symptoms. A pelvic examination may be necessary if you have symptoms including those associated with pelvic infections. Ask your health care provider if a screening pelvic exam is right for you.   The Pap test is the screening test for cervical cancer for women who are considered at risk.  If you had a hysterectomy for a problem that was not cancer or a condition that could lead to cancer, then you no longer need Pap tests.  If you are older than 65 years, and you have had normal Pap tests for the past 10 years, you no longer need to have Pap tests.  If you have had past treatment for cervical cancer or a condition that could lead to cancer, you need Pap tests and screening for cancer for at least 20 years after your treatment.  If you no longer get a Pap test, assess your risk factors if they change (such as having a new sexual partner). This can affect whether you should start being screened again.  Some women have medical problems that increase their chance of getting cervical cancer. If this is the case for you, your health care provider may recommend more frequent screening and Pap tests.  The human papillomavirus (HPV) test is another test that may be used for cervical cancer screening. The HPV test looks for the virus that can cause cell changes in the cervix. The cells collected during the Pap test can be tested for HPV.  The HPV test can be used to screen women 30 years of age and older. Getting tested for HPV can extend the interval between normal Pap tests from three to five years.  An HPV test also should be used to screen women of any age who have unclear Pap test results.  After 62 years of age, women should have HPV testing as often as Pap tests.  Colorectal Cancer  This type of cancer can be detected and often prevented.  Routine colorectal cancer screening usually begins at 62 years of  age and continues through 62 years of age.  Your health care provider may recommend screening at an earlier age if you have risk factors for colon cancer.  Your health care provider may also recommend using home test kits to check for hidden blood in the stool.  A small camera at the end of a tube can be used to examine your colon directly (sigmoidoscopy or colonoscopy). This is done to check for the earliest forms of colorectal cancer.  Routine screening usually begins at age 50.  Direct examination of the colon should be repeated every 5-10 years through 62 years of age. However, you may need to be screened more often if early forms of precancerous polyps or small growths are found. Skin Cancer  Check your skin from head to toe regularly.  Tell your health care provider about any new moles or changes in   moles, especially if there is a change in a mole's shape or color.  Also tell your health care provider if you have a mole that is larger than the size of a pencil eraser.  Always use sunscreen. Apply sunscreen liberally and repeatedly throughout the day.  Protect yourself by wearing long sleeves, pants, a wide-brimmed hat, and sunglasses whenever you are outside. HEART DISEASE, DIABETES, AND HIGH BLOOD PRESSURE   Have your blood pressure checked at least every 1-2 years. High blood pressure causes heart disease and increases the risk of stroke.  If you are between 75 years and 42 years old, ask your health care provider if you should take aspirin to prevent strokes.  Have regular diabetes screenings. This involves taking a blood sample to check your fasting blood sugar level.  If you are at a normal weight and have a low risk for diabetes, have this test once every three years after 62 years of age.  If you are overweight and have a high risk for diabetes, consider being tested at a younger age or more often. PREVENTING INFECTION  Hepatitis B  If you have a higher risk for  hepatitis B, you should be screened for this virus. You are considered at high risk for hepatitis B if:  You were born in a country where hepatitis B is common. Ask your health care provider which countries are considered high risk.  Your parents were born in a high-risk country, and you have not been immunized against hepatitis B (hepatitis B vaccine).  You have HIV or AIDS.  You use needles to inject street drugs.  You live with someone who has hepatitis B.  You have had sex with someone who has hepatitis B.  You get hemodialysis treatment.  You take certain medicines for conditions, including cancer, organ transplantation, and autoimmune conditions. Hepatitis C  Blood testing is recommended for:  Everyone born from [birth year] through 1963.  Anyone with known risk factors for hepatitis C. Sexually transmitted infections (STIs)  You should be screened for sexually transmitted infections (STIs) including gonorrhea and chlamydia if:  You are sexually active and are younger than 62 years of age.  You are older than 62 years of age and your health care provider tells you that you are at risk for this type of infection.  Your sexual activity has changed since you were last screened and you are at an increased risk for chlamydia or gonorrhea. Ask your health care provider if you are at risk.  If you do not have HIV, but are at risk, it may be recommended that you take a prescription medicine daily to prevent HIV infection. This is called pre-exposure prophylaxis (PrEP). You are considered at risk if:  You are sexually active and do not regularly use condoms or know the HIV status of your partner(s).  You take drugs by injection.  You are sexually active with a partner who has HIV. Talk with your health care provider about whether you are at high risk of being infected with HIV. If you choose to begin PrEP, you should first be tested for HIV. You should then be tested every 3 months for  as long as you are taking PrEP.  PREGNANCY   If you are premenopausal and you may become pregnant, ask your health care provider about preconception counseling.  If you may become pregnant, take 400 to 800 micrograms (mcg) of folic acid every day.  If you want to prevent pregnancy, talk to your  partner who has HIV.  Talk with your health care provider about whether you are at high risk of being infected with HIV. If you choose to begin PrEP, you should first be tested for HIV. You should then be tested every 3 months for  as long as you are taking PrEP.   PREGNANCY   · If you are premenopausal and you may become pregnant, ask your health care provider about preconception counseling.  · If you may become pregnant, take 400 to 800 micrograms (mcg) of folic acid every day.  · If you want to prevent pregnancy, talk to your health care provider about birth control (contraception).  OSTEOPOROSIS AND MENOPAUSE   · Osteoporosis is a disease in which the bones lose minerals and strength with aging. This can result in serious bone fractures. Your risk for osteoporosis can be identified using a bone density scan.  · If you are 65 years of age or older, or if you are at risk for osteoporosis and fractures, ask your health care provider if you should be screened.  · Ask your health care provider whether you should take a calcium or vitamin D supplement to lower your risk for osteoporosis.  · Menopause may have certain physical symptoms and risks.  · Hormone replacement therapy may reduce some of these symptoms and risks.  Talk to your health care provider about whether hormone replacement therapy is right for you.   HOME CARE INSTRUCTIONS   · Schedule regular health, dental, and eye exams.  · Stay current with your immunizations.    · Do not use any tobacco products including cigarettes, chewing tobacco, or electronic cigarettes.  · If you are pregnant, do not drink alcohol.  · If you are breastfeeding, limit how much and how often you drink alcohol.  · Limit alcohol intake to no more than 1 drink per day for nonpregnant women. One drink equals 12 ounces of beer, 5 ounces of wine, or 1½ ounces of hard liquor.  · Do not use street drugs.  · Do not share needles.  · Ask your health care provider for help if you need support or information about quitting drugs.  · Tell your health care provider if you often feel depressed.  · Tell your health care provider if you have ever been abused or do not feel safe at home.  Document Released: 01/19/2011  Document Revised: 11/20/2013 Document Reviewed: 06/07/2013  [employer]® Patient Information ©2015 [employer]. This information is not intended to replace advice given to you by your health care provider. Make sure you discuss any questions you have with your health care provider.  Preventive Care for Adults  A healthy lifestyle and preventive care can promote health and wellness. Preventive health guidelines for women include the following key practices.  · A routine yearly physical is a good way to check with your health care provider about your health and preventive screening. It is a chance to share any concerns and updates on your health and to receive a thorough exam.  · Visit your dentist for a routine exam and preventive care every 6 months. Brush your teeth twice a day and floss once a day. Good oral hygiene prevents tooth decay and gum disease.  · The frequency of eye exams is based on your age, health, family medical history, use of contact lenses, and other factors. Follow your health care provider's recommendations for frequency of eye exams.  · Eat a healthy diet. Foods like vegetables, fruits, whole   grains, low-fat dairy products, and lean protein foods contain the nutrients you need without too many calories. Decrease your intake of foods high in solid fats, added sugars, and salt. Eat the right amount of calories for you. Get information about a proper diet from your health care provider, if necessary.  · Regular physical exercise is one of the most important things you can do for your health. Most adults should get at least 150 minutes of moderate-intensity exercise (any activity that increases your heart rate and causes you to sweat) each week. In addition, most adults need muscle-strengthening exercises on 2 or more days a week.  · Maintain a healthy weight. The body mass index (BMI) is a screening tool to identify possible weight problems. It provides an estimate of body fat based on height and  weight. Your health care provider can find your BMI and can help you achieve or maintain a healthy weight. For adults 20 years and older:  ¨ A BMI below 18.5 is considered underweight.  ¨ A BMI of 18.5 to 24.9 is normal.  ¨ A BMI of 25 to 29.9 is considered overweight.  ¨ A BMI of 30 and above is considered obese.  · Maintain normal blood lipids and cholesterol levels by exercising and minimizing your intake of saturated fat. Eat a balanced diet with plenty of fruit and vegetables. Blood tests for lipids and cholesterol should begin at age 20 and be repeated every 5 years. If your lipid or cholesterol levels are high, you are over 50, or you are at high risk for heart disease, you may need your cholesterol levels checked more frequently. Ongoing high lipid and cholesterol levels should be treated with medicines if diet and exercise are not working.  · If you smoke, find out from your health care provider how to quit. If you do not use tobacco, do not start.  · Lung cancer screening is recommended for adults aged 55-80 years who are at high risk for developing lung cancer because of a history of smoking. A yearly low-dose CT scan of the lungs is recommended for people who have at least a 30-pack-year history of smoking and are a current smoker or have quit within the past 15 years. A pack year of smoking is smoking an average of 1 pack of cigarettes a day for 1 year (for example: 1 pack a day for 30 years or 2 packs a day for 15 years). Yearly screening should continue until the smoker has stopped smoking for at least 15 years. Yearly screening should be stopped for people who develop a health problem that would prevent them from having lung cancer treatment.  · If you are pregnant, do not drink alcohol. If you are breastfeeding, be very cautious about drinking alcohol. If you are not pregnant and choose to drink alcohol, do not have more than 1 drink per day. One drink is considered to be 12 ounces (355 mL) of beer,  5 ounces (148 mL) of wine, or 1.5 ounces (44 mL) of liquor.  · Avoid use of street drugs. Do not share needles with anyone. Ask for help if you need support or instructions about stopping the use of drugs.  · High blood pressure causes heart disease and increases the risk of stroke. Your blood pressure should be checked at least every 1 to 2 years. Ongoing high blood pressure should be treated with medicines if weight loss and exercise do not work.  · If you are 55-79 years old, ask your   health care provider if you should take aspirin to prevent strokes.  · Diabetes screening involves taking a blood sample to check your fasting blood sugar level. This should be done once every 3 years, after age 45, if you are within normal weight and without risk factors for diabetes. Testing should be considered at a younger age or be carried out more frequently if you are overweight and have at least 1 risk factor for diabetes.  · Breast cancer screening is essential preventive care for women. You should practice "breast self-awareness." This means understanding the normal appearance and feel of your breasts and may include breast self-examination. Any changes detected, no matter how small, should be reported to a health care provider. Women in their 20s and 30s should have a clinical breast exam (CBE) by a health care provider as part of a regular health exam every 1 to 3 years. After age 40, women should have a CBE every year. Starting at age 40, women should consider having a mammogram (breast X-ray test) every year. Women who have a family history of breast cancer should talk to their health care provider about genetic screening. Women at a high risk of breast cancer should talk to their health care providers about having an MRI and a mammogram every year.  · Breast cancer gene (BRCA)-related cancer risk assessment is recommended for women who have family members with BRCA-related cancers. BRCA-related cancers include breast,  ovarian, tubal, and peritoneal cancers. Having family members with these cancers may be associated with an increased risk for harmful changes (mutations) in the breast cancer genes BRCA1 and BRCA2. Results of the assessment will determine the need for genetic counseling and BRCA1 and BRCA2 testing.  · Routine pelvic exams to screen for cancer are no longer recommended for nonpregnant women who are considered low risk for cancer of the pelvic organs (ovaries, uterus, and vagina) and who do not have symptoms. Ask your health care provider if a screening pelvic exam is right for you.  · If you have had past treatment for cervical cancer or a condition that could lead to cancer, you need Pap tests and screening for cancer for at least 20 years after your treatment. If Pap tests have been discontinued, your risk factors (such as having a new sexual partner) need to be reassessed to determine if screening should be resumed. Some women have medical problems that increase the chance of getting cervical cancer. In these cases, your health care provider may recommend more frequent screening and Pap tests.  · The HPV test is an additional test that may be used for cervical cancer screening. The HPV test looks for the virus that can cause the cell changes on the cervix. The cells collected during the Pap test can be tested for HPV. The HPV test could be used to screen women aged 30 years and older, and should be used in women of any age who have unclear Pap test results. After the age of 30, women should have HPV testing at the same frequency as a Pap test.  · Colorectal cancer can be detected and often prevented. Most routine colorectal cancer screening begins at the age of 50 years and continues through age 75 years. However, your health care provider may recommend screening at an earlier age if you have risk factors for colon cancer. On a yearly basis, your health care provider may provide home test kits to check for hidden  blood in the stool. Use of a small   camera at the end of a tube, to directly examine the colon (sigmoidoscopy or colonoscopy), can detect the earliest forms of colorectal cancer. Talk to your health care provider about this at age 50, when routine screening begins.  Direct exam of the colon should be repeated every 5-10 years through age 75 years, unless early forms of pre-cancerous polyps or small growths are found.  · People who are at an increased risk for hepatitis B should be screened for this virus. You are considered at high risk for hepatitis B if:  ¨ You were born in a country where hepatitis B occurs often. Talk with your health care provider about which countries are considered high risk.  ¨ Your parents were born in a high-risk country and you have not received a shot to protect against hepatitis B (hepatitis B vaccine).  ¨ You have HIV or AIDS.  ¨ You use needles to inject street drugs.  ¨ You live with, or have sex with, someone who has hepatitis B.  ¨ You get hemodialysis treatment.  ¨ You take certain medicines for conditions like cancer, organ transplantation, and autoimmune conditions.  · Hepatitis C blood testing is recommended for all people born from 1945 through 1965 and any individual with known risks for hepatitis C.  · Practice safe sex. Use condoms and avoid high-risk sexual practices to reduce the spread of sexually transmitted infections (STIs). STIs include gonorrhea, chlamydia, syphilis, trichomonas, herpes, HPV, and human immunodeficiency virus (HIV). Herpes, HIV, and HPV are viral illnesses that have no cure. They can result in disability, cancer, and death.  · You should be screened for sexually transmitted illnesses (STIs) including gonorrhea and chlamydia if:  ¨ You are sexually active and are younger than 24 years.  ¨ You are older than 24 years and your health care provider tells you that you are at risk for this type of infection.  ¨ Your sexual activity has changed since you  were last screened and you are at an increased risk for chlamydia or gonorrhea. Ask your health care provider if you are at risk.  · If you are at risk of being infected with HIV, it is recommended that you take a prescription medicine daily to prevent HIV infection. This is called preexposure prophylaxis (PrEP). You are considered at risk if:  ¨ You are a heterosexual woman, are sexually active, and are at increased risk for HIV infection.  ¨ You take drugs by injection.  ¨ You are sexually active with a partner who has HIV.  ¨ Talk with your health care provider about whether you are at high risk of being infected with HIV. If you choose to begin PrEP, you should first be tested for HIV. You should then be tested every 3 months for as long as you are taking PrEP.  · Osteoporosis is a disease in which the bones lose minerals and strength with aging. This can result in serious bone fractures or breaks. The risk of osteoporosis can be identified using a bone density scan. Women ages 65 years and over and women at risk for fractures or osteoporosis should discuss screening with their health care providers. Ask your health care provider whether you should take a calcium supplement or vitamin D to reduce the rate of osteoporosis.  · Menopause can be associated with physical symptoms and risks. Hormone replacement therapy is available to decrease symptoms and risks. You should talk to your health care provider about whether hormone replacement therapy is right for   you.  · Use sunscreen. Apply sunscreen liberally and repeatedly throughout the day. You should seek shade when your shadow is shorter than you. Protect yourself by wearing long sleeves, pants, a wide-brimmed hat, and sunglasses year round, whenever you are outdoors.  · Once a month, do a whole body skin exam, using a mirror to look at the skin on your back. Tell your health care provider of new moles, moles that have irregular borders, moles that are larger  than a pencil eraser, or moles that have changed in shape or color.  · Stay current with required vaccines (immunizations).  ¨ Influenza vaccine. All adults should be immunized every year.  ¨ Tetanus, diphtheria, and acellular pertussis (Td, Tdap) vaccine. Pregnant women should receive 1 dose of Tdap vaccine during each pregnancy. The dose should be obtained regardless of the length of time since the last dose. Immunization is preferred during the 27th-36th week of gestation. An adult who has not previously received Tdap or who does not know her vaccine status should receive 1 dose of Tdap. This initial dose should be followed by tetanus and diphtheria toxoids (Td) booster doses every 10 years. Adults with an unknown or incomplete history of completing a 3-dose immunization series with Td-containing vaccines should begin or complete a primary immunization series including a Tdap dose. Adults should receive a Td booster every 10 years.  ¨ Varicella vaccine. An adult without evidence of immunity to varicella should receive 2 doses or a second dose if she has previously received 1 dose. Pregnant females who do not have evidence of immunity should receive the first dose after pregnancy. This first dose should be obtained before leaving the health care facility. The second dose should be obtained 4-8 weeks after the first dose.  ¨ Human papillomavirus (HPV) vaccine. Females aged 13-26 years who have not received the vaccine previously should obtain the 3-dose series. The vaccine is not recommended for use in pregnant females. However, pregnancy testing is not needed before receiving a dose. If a female is found to be pregnant after receiving a dose, no treatment is needed. In that case, the remaining doses should be delayed until after the pregnancy. Immunization is recommended for any person with an immunocompromised condition through the age of 26 years if she did not get any or all doses earlier. During the 3-dose  series, the second dose should be obtained 4-8 weeks after the first dose. The third dose should be obtained 24 weeks after the first dose and 16 weeks after the second dose.  ¨ Zoster vaccine. One dose is recommended for adults aged 60 years or older unless certain conditions are present.  ¨ Measles, mumps, and rubella (MMR) vaccine. Adults born before 1957 generally are considered immune to measles and mumps. Adults born in 1957 or later should have 1 or more doses of MMR vaccine unless there is a contraindication to the vaccine or there is laboratory evidence of immunity to each of the three diseases. A routine second dose of MMR vaccine should be obtained at least 28 days after the first dose for students attending postsecondary schools, health care workers, or international travelers. People who received inactivated measles vaccine or an unknown type of measles vaccine during 1963-1967 should receive 2 doses of MMR vaccine. People who received inactivated mumps vaccine or an unknown type of mumps vaccine before 1979 and are at high risk for mumps infection should consider immunization with 2 doses of MMR vaccine. For females of childbearing age,   rubella immunity should be determined. If there is no evidence of immunity, females who are not pregnant should be vaccinated. If there is no evidence of immunity, females who are pregnant should delay immunization until after pregnancy. Unvaccinated health care workers born before 1957 who lack laboratory evidence of measles, mumps, or rubella immunity or laboratory confirmation of disease should consider measles and mumps immunization with 2 doses of MMR vaccine or rubella immunization with 1 dose of MMR vaccine.  ¨ Pneumococcal 13-valent conjugate (PCV13) vaccine. When indicated, a person who is uncertain of her immunization history and has no record of immunization should receive the PCV13 vaccine. An adult aged 19 years or older who has certain medical conditions  and has not been previously immunized should receive 1 dose of PCV13 vaccine. This PCV13 should be followed with a dose of pneumococcal polysaccharide (PPSV23) vaccine. The PPSV23 vaccine dose should be obtained at least 8 weeks after the dose of PCV13 vaccine. An adult aged 19 years or older who has certain medical conditions and previously received 1 or more doses of PPSV23 vaccine should receive 1 dose of PCV13. The PCV13 vaccine dose should be obtained 1 or more years after the last PPSV23 vaccine dose.  ¨ Pneumococcal polysaccharide (PPSV23) vaccine. When PCV13 is also indicated, PCV13 should be obtained first. All adults aged 65 years and older should be immunized. An adult younger than age 65 years who has certain medical conditions should be immunized. Any person who resides in a nursing home or long-term care facility should be immunized. An adult smoker should be immunized. People with an immunocompromised condition and certain other conditions should receive both PCV13 and PPSV23 vaccines. People with human immunodeficiency virus (HIV) infection should be immunized as soon as possible after diagnosis. Immunization during chemotherapy or radiation therapy should be avoided. Routine use of PPSV23 vaccine is not recommended for American Indians, [place] Natives, or people younger than 65 years unless there are medical conditions that require PPSV23 vaccine. When indicated, people who have unknown immunization and have no record of immunization should receive PPSV23 vaccine. One-time revaccination 5 years after the first dose of PPSV23 is recommended for people aged 19-64 years who have chronic kidney failure, nephrotic syndrome, asplenia, or immunocompromised conditions. People who received 1-2 doses of PPSV23 before age 65 years should receive another dose of PPSV23 vaccine at age 65 years or later if at least 5 years have passed since the previous dose. Doses of PPSV23 are not needed for people immunized  with PPSV23 at or after age 65 years.  ¨ Meningococcal vaccine. Adults with asplenia or persistent complement component deficiencies should receive 2 doses of quadrivalent meningococcal conjugate (MenACWY-D) vaccine. The doses should be obtained at least 2 months apart. Microbiologists working with certain meningococcal bacteria, military recruits, people at risk during an outbreak, and people who travel to or live in countries with a high rate of meningitis should be immunized. A first-year college student up through age 21 years who is living in a residence hall should receive a dose if she did not receive a dose on or after her 16th birthday. Adults who have certain high-risk conditions should receive one or more doses of vaccine.  ¨ Hepatitis A vaccine. Adults who wish to be protected from this disease, have certain high-risk conditions, work with hepatitis A-infected animals, work in hepatitis A research labs, or travel to or work in countries with a high rate of hepatitis A should be immunized. Adults who were previously   unvaccinated and who anticipate close contact with an international adoptee during the first 60 days after arrival in the [place] from a country with a high rate of hepatitis A should be immunized.  ¨ Hepatitis B vaccine. Adults who wish to be protected from this disease, have certain high-risk conditions, may be exposed to blood or other infectious body fluids, are household contacts or sex partners of hepatitis B positive people, are clients or workers in certain care facilities, or travel to or work in countries with a high rate of hepatitis B should be immunized.  ¨ Haemophilus influenzae type b (Hib) vaccine. A previously unvaccinated person with asplenia or sickle cell disease or having a scheduled splenectomy should receive 1 dose of Hib vaccine. Regardless of previous immunization, a recipient of a hematopoietic stem cell transplant should receive a 3-dose series 6-12 months  after her successful transplant. Hib vaccine is not recommended for adults with HIV infection.  Preventive Services / Frequency  Ages 19 to 39 years  · Blood pressure check.** / Every 1 to 2 years.  · Lipid and cholesterol check.** / Every 5 years beginning at age 20.  · Clinical breast exam.** / Every 3 years for women in their 20s and 30s.  · BRCA-related cancer risk assessment.** / For women who have family members with a BRCA-related cancer (breast, ovarian, tubal, or peritoneal cancers).  · Pap test.** / Every 2 years from ages 21 through 29. Every 3 years starting at age 30 through age 65 or 70 with a history of 3 consecutive normal Pap tests.  · HPV screening.** / Every 3 years from ages 30 through ages 65 to 70 with a history of 3 consecutive normal Pap tests.  · Hepatitis C blood test.** / For any individual with known risks for hepatitis C.  · Skin self-exam. / Monthly.  · Influenza vaccine. / Every year.  · Tetanus, diphtheria, and acellular pertussis (Tdap, Td) vaccine.** / Consult your health care provider. Pregnant women should receive 1 dose of Tdap vaccine during each pregnancy. 1 dose of Td every 10 years.  · Varicella vaccine.** / Consult your health care provider. Pregnant females who do not have evidence of immunity should receive the first dose after pregnancy.  · HPV vaccine. / 3 doses over 6 months, if 26 and younger. The vaccine is not recommended for use in pregnant females. However, pregnancy testing is not needed before receiving a dose.  · Measles, mumps, rubella (MMR) vaccine.** / You need at least 1 dose of MMR if you were born in 1957 or later. You may also need a 2nd dose. For females of childbearing age, rubella immunity should be determined. If there is no evidence of immunity, females who are not pregnant should be vaccinated. If there is no evidence of immunity, females who are pregnant should delay immunization until after pregnancy.  · Pneumococcal 13-valent conjugate (PCV13)  vaccine.** / Consult your health care provider.  · Pneumococcal polysaccharide (PPSV23) vaccine.** / 1 to 2 doses if you smoke cigarettes or if you have certain conditions.  · Meningococcal vaccine.** / 1 dose if you are age 19 to 21 years and a first-year college student living in a residence hall, or have one of several medical conditions, you need to get vaccinated against meningococcal disease. You may also need additional booster doses.  · Hepatitis A vaccine.** / Consult your health care provider.  · Hepatitis B vaccine.** / Consult your health care provider.  · Haemophilus   influenzae type b (Hib) vaccine.** / Consult your health care provider.  Ages 40 to 64 years  · Blood pressure check.** / Every 1 to 2 years.  · Lipid and cholesterol check.** / Every 5 years beginning at age 20 years.  · Lung cancer screening. / Every year if you are aged 55-80 years and have a 30-pack-year history of smoking and currently smoke or have quit within the past 15 years. Yearly screening is stopped once you have quit smoking for at least 15 years or develop a health problem that would prevent you from having lung cancer treatment.  · Clinical breast exam.** / Every year after age 40 years.  ·  BRCA-related cancer risk assessment.** / For women who have family members with a BRCA-related cancer (breast, ovarian, tubal, or peritoneal cancers).  · Mammogram.** / Every year beginning at age 40 years and continuing for as long as you are in good health. Consult with your health care provider.  · Pap test.** / Every 3 years starting at age 30 years through age 65 or 70 years with a history of 3 consecutive normal Pap tests.  · HPV screening.** / Every 3 years from ages 30 years through ages 65 to 70 years with a history of 3 consecutive normal Pap tests.  · Fecal occult blood test (FOBT) of stool. / Every year beginning at age 50 years and continuing until age 75 years. You may not need to do this test if you get a colonoscopy every  10 years.  · Flexible sigmoidoscopy or colonoscopy.** / Every 5 years for a flexible sigmoidoscopy or every 10 years for a colonoscopy beginning at age 50 years and continuing until age 75 years.  · Hepatitis C blood test.** / For all people born from 1945 through 1965 and any individual with known risks for hepatitis C.  · Skin self-exam. / Monthly.  · Influenza vaccine. / Every year.  · Tetanus, diphtheria, and acellular pertussis (Tdap/Td) vaccine.** / Consult your health care provider. Pregnant women should receive 1 dose of Tdap vaccine during each pregnancy. 1 dose of Td every 10 years.  · Varicella vaccine.** / Consult your health care provider. Pregnant females who do not have evidence of immunity should receive the first dose after pregnancy.  · Zoster vaccine.** / 1 dose for adults aged 60 years or older.  · Measles, mumps, rubella (MMR) vaccine.** / You need at least 1 dose of MMR if you were born in 1957 or later. You may also need a 2nd dose. For females of childbearing age, rubella immunity should be determined. If there is no evidence of immunity, females who are not pregnant should be vaccinated. If there is no evidence of immunity, females who are pregnant should delay immunization until after pregnancy.  · Pneumococcal 13-valent conjugate (PCV13) vaccine.** / Consult your health care provider.  · Pneumococcal polysaccharide (PPSV23) vaccine.** / 1 to 2 doses if you smoke cigarettes or if you have certain conditions.  · Meningococcal vaccine.** / Consult your health care provider.  · Hepatitis A vaccine.** / Consult your health care provider.  · Hepatitis B vaccine.** / Consult your health care provider.  · Haemophilus influenzae type b (Hib) vaccine.** / Consult your health care provider.  Ages 65 years and over  · Blood pressure check.** / Every 1 to 2 years.  · Lipid and cholesterol check.** / Every 5 years beginning at age 20 years.  · Lung cancer screening. / Every year if you are   aged 55-80  years and have a 30-pack-year history of smoking and currently smoke or have quit within the past 15 years. Yearly screening is stopped once you have quit smoking for at least 15 years or develop a health problem that would prevent you from having lung cancer treatment.  · Clinical breast exam.** / Every year after age 40 years.  ·  BRCA-related cancer risk assessment.** / For women who have family members with a BRCA-related cancer (breast, ovarian, tubal, or peritoneal cancers).  · Mammogram.** / Every year beginning at age 40 years and continuing for as long as you are in good health. Consult with your health care provider.  · Pap test.** / Every 3 years starting at age 30 years through age 65 or 70 years with 3 consecutive normal Pap tests. Testing can be stopped between 65 and 70 years with 3 consecutive normal Pap tests and no abnormal Pap or HPV tests in the past 10 years.  · HPV screening.** / Every 3 years from ages 30 years through ages 65 or 70 years with a history of 3 consecutive normal Pap tests. Testing can be stopped between 65 and 70 years with 3 consecutive normal Pap tests and no abnormal Pap or HPV tests in the past 10 years.  · Fecal occult blood test (FOBT) of stool. / Every year beginning at age 50 years and continuing until age 75 years. You may not need to do this test if you get a colonoscopy every 10 years.  · Flexible sigmoidoscopy or colonoscopy.** / Every 5 years for a flexible sigmoidoscopy or every 10 years for a colonoscopy beginning at age 50 years and continuing until age 75 years.  · Hepatitis C blood test.** / For all people born from 1945 through 1965 and any individual with known risks for hepatitis C.  · Osteoporosis screening.** / A one-time screening for women ages 65 years and over and women at risk for fractures or osteoporosis.  · Skin self-exam. / Monthly.  · Influenza vaccine. / Every year.  · Tetanus, diphtheria, and acellular pertussis (Tdap/Td) vaccine.** / 1 dose of  Td every 10 years.  · Varicella vaccine.** / Consult your health care provider.  · Zoster vaccine.** / 1 dose for adults aged 60 years or older.  · Pneumococcal 13-valent conjugate (PCV13) vaccine.** / Consult your health care provider.  · Pneumococcal polysaccharide (PPSV23) vaccine.** / 1 dose for all adults aged 65 years and older.  · Meningococcal vaccine.** / Consult your health care provider.  · Hepatitis A vaccine.** / Consult your health care provider.  · Hepatitis B vaccine.** / Consult your health care provider.  · Haemophilus influenzae type b (Hib) vaccine.** / Consult your health care provider.  ** Family history and personal history of risk and conditions may change your health care provider's recommendations.  Document Released: 09/01/2001 Document Revised: 11/20/2013 Document Reviewed: 12/01/2010  [employer]® Patient Information ©2015 [employer]. This information is not intended to replace advice given to you by your health care provider. Make sure you discuss any questions you have with your health care provider.

## 2015-01-03 LAB — MICROALBUMIN / CREATININE URINE RATIO: CREATININE, URINE: 91 mg/dL

## 2015-01-03 LAB — CYTOLOGY - PAP

## 2015-01-04 ENCOUNTER — Telehealth: Payer: Self-pay | Admitting: Internal Medicine

## 2015-01-04 ENCOUNTER — Encounter: Payer: Self-pay | Admitting: Family Medicine

## 2015-01-04 MED ORDER — BUDESONIDE-FORMOTEROL FUMARATE 80-4.5 MCG/ACT IN AERO: 2.0000 | INHALATION_SPRAY | Freq: Two times a day (BID) | RESPIRATORY_TRACT | Status: DC

## 2015-01-04 NOTE — Telephone Encounter (Signed)
symbicort sent to pt's pharmacy. Nothing further needed at this time.

## 2015-01-09 ENCOUNTER — Ambulatory Visit (INDEPENDENT_AMBULATORY_CARE_PROVIDER_SITE_OTHER): Payer: Medicare Other | Admitting: Podiatry

## 2015-01-09 ENCOUNTER — Encounter: Payer: Self-pay | Admitting: Podiatry

## 2015-01-09 VITALS — Ht 67.0 in | Wt 351.0 lb

## 2015-01-09 DIAGNOSIS — B351 Tinea unguium: Secondary | ICD-10-CM | POA: Insufficient documentation

## 2015-01-09 DIAGNOSIS — L600 Ingrowing nail: Secondary | ICD-10-CM | POA: Diagnosis not present

## 2015-01-09 DIAGNOSIS — M79673 Pain in unspecified foot: Secondary | ICD-10-CM

## 2015-01-09 DIAGNOSIS — M79606 Pain in leg, unspecified: Secondary | ICD-10-CM
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
## 2015-01-09 NOTE — Patient Instructions (Signed)
Seen for hypertrophicpainful nails. All nails debrided. Return in 3 months or as needed.

## 2015-01-14 ENCOUNTER — Other Ambulatory Visit: Payer: Self-pay | Admitting: Family Medicine

## 2015-01-17 ENCOUNTER — Other Ambulatory Visit: Payer: Self-pay | Admitting: Family Medicine

## 2015-01-17 ENCOUNTER — Other Ambulatory Visit: Payer: Self-pay

## 2015-01-17 DIAGNOSIS — D509 Iron deficiency anemia, unspecified: Secondary | ICD-10-CM

## 2015-01-17 DIAGNOSIS — M109 Gout, unspecified: Secondary | ICD-10-CM

## 2015-01-17 DIAGNOSIS — D869 Sarcoidosis, unspecified: Secondary | ICD-10-CM

## 2015-01-17 MED ORDER — FUROSEMIDE 20 MG PO TABS: 20.0000 mg | ORAL_TABLET | Freq: Two times a day (BID) | ORAL | Status: DC

## 2015-01-17 MED ORDER — PREDNISONE 10 MG PO TABS: 10.0000 mg | ORAL_TABLET | Freq: Every day | ORAL | Status: DC

## 2015-01-17 MED ORDER — FEBUXOSTAT 40 MG PO TABS: 40.0000 mg | ORAL_TABLET | Freq: Every day | ORAL | Status: DC

## 2015-01-17 MED ORDER — FERROUS SULFATE 325 (65 FE) MG PO TABS: 325.0000 mg | ORAL_TABLET | Freq: Every day | ORAL | Status: DC

## 2015-01-17 NOTE — Telephone Encounter (Signed)
Refills sent to pharmacy. Thanks.

## 2015-01-17 NOTE — Telephone Encounter (Signed)
Refill for uloric '40mg'$  sent into pharmacy. Thanks!

## 2015-01-25 ENCOUNTER — Other Ambulatory Visit: Payer: Self-pay | Admitting: Family Medicine

## 2015-01-25 DIAGNOSIS — D869 Sarcoidosis, unspecified: Secondary | ICD-10-CM

## 2015-01-25 MED ORDER — PREDNISONE 10 MG PO TABS: 10.0000 mg | ORAL_TABLET | Freq: Every day | ORAL | Status: DC

## 2015-01-25 NOTE — Telephone Encounter (Signed)
REFILL RE-SENT IN. THANKS!

## 2015-01-28 DIAGNOSIS — D869 Sarcoidosis, unspecified: Secondary | ICD-10-CM | POA: Diagnosis not present

## 2015-01-28 DIAGNOSIS — H5213 Myopia, bilateral: Secondary | ICD-10-CM | POA: Diagnosis not present

## 2015-01-28 DIAGNOSIS — H2513 Age-related nuclear cataract, bilateral: Secondary | ICD-10-CM | POA: Diagnosis not present

## 2015-01-28 DIAGNOSIS — H25013 Cortical age-related cataract, bilateral: Secondary | ICD-10-CM | POA: Diagnosis not present

## 2015-02-05 ENCOUNTER — Telehealth: Payer: Self-pay

## 2015-02-05 NOTE — Telephone Encounter (Signed)
Refill request for oxycodone '5mg'$ . LOV 01/02/2015. Please advise. Thanks!

## 2015-02-06 NOTE — Telephone Encounter (Signed)
I do not see in her chart why she gets narcotics. I do not see that she has SCD.

## 2015-02-06 NOTE — Telephone Encounter (Signed)
She is not [facility] patient. Looks like the associated dx is knee pain, we have been the one prescribing this. Please advise.

## 2015-02-08 ENCOUNTER — Ambulatory Visit (INDEPENDENT_AMBULATORY_CARE_PROVIDER_SITE_OTHER): Payer: Medicare Other | Admitting: Family Medicine

## 2015-02-08 ENCOUNTER — Other Ambulatory Visit: Payer: Self-pay | Admitting: Family Medicine

## 2015-02-08 VITALS — BP 149/65 | HR 65 | Temp 98.2°F | Resp 18 | Ht 65.0 in | Wt 365.0 lb

## 2015-02-08 DIAGNOSIS — G894 Chronic pain syndrome: Secondary | ICD-10-CM | POA: Diagnosis not present

## 2015-02-08 DIAGNOSIS — M25561 Pain in right knee: Secondary | ICD-10-CM

## 2015-02-08 MED ORDER — TRAMADOL HCL 50 MG PO TABS: 50.0000 mg | ORAL_TABLET | Freq: Three times a day (TID) | ORAL | Status: DC | PRN

## 2015-02-08 NOTE — Progress Notes (Deleted)
Patient ID: Pamela [name], female   DOB: [birth year], 64 y.o.   MRN: [id number]

## 2015-02-08 NOTE — Progress Notes (Signed)
Patient ID: Pamela [name], female   DOB: [birth year], 64 y.o.   MRN: [id number]   Patient is here today requesting a refill on her opoid pain medication. This visit will be a discussion of opoid use for chronic pain and options.

## 2015-02-08 NOTE — Patient Instructions (Signed)
You can take Tramadol up to every 8 hours. I am hoping one a day will suffice. Let us know if this is not controlling your pain sufficiently and we will make a referral to pain clinic. Follow-up in 3 months and as needed. Take tylenol sparingly.

## 2015-02-08 NOTE — Progress Notes (Signed)
Patient ID: Rya Rausch, female   DOB: Jul 04, 1951, 64 y.o.   MRN: [id number] Patient is here today requesting a refill on her opoid pain medications that she uses for chronic joint pain. She reports usually taking once a day in AM. She has been using since October 15.  This visit today will be a discussion of her use of opoids for chronic pain and look at some options.  I have explained that is it considered inappropriate to use opoids for chronic pain except in certain circumstances.  Have explained that I can switch her to Tramadol 50 up to 3 times a day but cannot go stronger than that.She expresses understanding.  She has renal insufficiency and we are unable to use NSAIDS. There is a mild increase in LFTs and do not recommend frequent acetamenophen.   Exam deferred  Assessment:  Chronic joint pain currently managed with opoid pain medications  Plan -trial of Tramadom 50 mg, #30, one po tid prn -Referral to pain clinic if this is not sufficient  Micheline Chapman.

## 2015-02-10 NOTE — ED Provider Notes (Addendum)
CSN: [id number]     Arrival date & time 12/24/14  2116 History   First MD Initiated Contact with Patient 12/25/14 0203     Chief Complaint  Patient presents with  . Leg Pain  . Arm Pain     (Consider location/radiation/quality/duration/timing/severity/associated sxs/prior Treatment) HPI Comments: Sedation with history of gout and arthritis presents with multiple joint pain, swelling, redness.  She's been taking her normal medications without relief.  She states at this point.  She is unable to ambulate due to the pain in her knees, foot, elbows  Patient is a 64 y.o. female presenting with leg pain and arm pain. The history is provided by the patient.  Leg Pain Location:  Knee and foot Time since incident:  1 week Injury: no   Knee location:  L knee and R knee Foot location:  R foot Pain details:    Quality:  Aching   Radiates to:  Does not radiate   Severity:  Moderate   Onset quality:  Gradual   Timing:  Constant   Progression:  Worsening Chronicity:  Recurrent Dislocation: no   Foreign body present:  No foreign bodies Prior injury to area:  No Relieved by:  Nothing Worsened by:  Activity Ineffective treatments:  NSAIDs Associated symptoms: decreased ROM, stiffness and swelling   Associated symptoms: no fever   Arm Pain Associated symptoms include arthralgias and joint swelling. Pertinent negatives include no chest pain, chills, fever or rash.    Past Medical History  Diagnosis Date  . CHF (congestive heart failure)     Preserved EF  . Asthma   . COPD (chronic obstructive pulmonary disease)   . Arthritis   . Sarcoidosis   . Cancer     lung, adenocarcinoma right lung 2012  . Hypertension   . Sleep apnea   . Oxygen deficiency    Past Surgical History  Procedure Laterality Date  . Lung biopsy    . Tubal ligation    . Vein ligation and stripping     Family History  Problem Relation Age of Onset  . Hypertension Mother   . Renal Disease Mother   . Cancer  Father   . Heart disease Father     No details  . Cancer Brother   . Diabetes Brother   . Cervical cancer Sister   . Diabetes Sister   . Multiple myeloma Sister    History  Substance Use Topics  . Smoking status: Never Smoker   . Smokeless tobacco: Never Used  . Alcohol Use: No   OB History    No data available     Review of Systems  Constitutional: Negative for fever and chills.  Respiratory: Negative for shortness of breath.   Cardiovascular: Negative for chest pain and leg swelling.  Musculoskeletal: Positive for joint swelling, arthralgias, gait problem and stiffness.  Skin: Negative for rash and wound.  All other systems reviewed and are negative.     Allergies  Sulfur  Home Medications   Prior to Admission medications   Medication Sig Start Date End Date Taking? Authorizing Provider  albuterol (PROAIR HFA) 108 (90 BASE) MCG/ACT inhaler Inhale 2 puffs into the lungs every 6 (six) hours as needed for wheezing or shortness of breath. 11/30/14  Yes Tanda Rockers, MD  aspirin 81 MG tablet Take 81 mg by mouth daily.   Yes Historical Provider, MD  Calcium Carbonate-Vitamin D (CALTRATE 600+D PO) Take 1 tablet by mouth daily.    Yes Historical  Provider, MD  docusate sodium (COLACE) 100 MG capsule Take 100 mg by mouth 2 (two) times daily.   Yes Historical Provider, MD  mometasone-formoterol (DULERA) 100-5 MCG/ACT AERO Take 2 puffs first thing in am and then another 2 puffs about 12 hours later. 11/30/14  Yes Tanda Rockers, MD  budesonide-formoterol [facility]) 80-4.5 MCG/ACT inhaler Inhale 2 puffs into the lungs 2 (two) times daily. 01/04/15   Tanda Rockers, MD  febuxostat (ULORIC) 40 MG tablet Take 1 tablet (40 mg total) by mouth daily. 01/17/15   Micheline Chapman, NP  ferrous sulfate 325 (65 FE) MG tablet Take 1 tablet (325 mg total) by mouth daily with breakfast. 01/17/15   Micheline Chapman, NP  furosemide (LASIX) 20 MG tablet Take 1 tablet (20 mg total) by mouth 2 (two)  times daily. 01/17/15   Micheline Chapman, NP  predniSONE (DELTASONE) 10 MG tablet Take 1 tablet (10 mg total) by mouth daily with breakfast. 01/25/15   Micheline Chapman, NP  senna-docusate (SENOKOT-S) 8.6-50 MG per tablet Take 1 tablet by mouth 2 (two) times daily. 07/05/14   Dorena Dew, FNP  traMADol (ULTRAM) 50 MG tablet Take 1 tablet (50 mg total) by mouth every 8 (eight) hours as needed for moderate pain. 02/08/15   Micheline Chapman, NP   BP 128/54 mmHg  Pulse 68  Temp(Src) 99.1 F (37.3 C) (Oral)  Resp 18  SpO2 100% Physical Exam  Constitutional: She is oriented to person, place, and time. She appears well-developed and well-nourished.  HENT:  Head: Normocephalic.  Eyes: Pupils are equal, round, and reactive to light.  Neck: Normal range of motion.  Cardiovascular: Normal rate and regular rhythm.   Pulmonary/Chest: Effort normal.  Musculoskeletal: She exhibits tenderness.       Left elbow: She exhibits decreased range of motion. Tenderness found.       Right knee: She exhibits decreased range of motion and erythema.       Left knee: She exhibits decreased range of motion and erythema.       Right ankle: She exhibits decreased range of motion. Tenderness.  Neurological: She is alert and oriented to person, place, and time.  Skin: Skin is warm. There is erythema.  Nursing note and vitals reviewed.   ED Course  Procedures (including critical care time) Labs Review Labs Reviewed - No data to display  Imaging Review No results found.   EKG Interpretation None     patient was given pain medication, started on a security for instruction to follow-up with her primary care physician  MDM   Final diagnoses:  Acute gouty arthritis         Junius Creamer, NP 02/10/15 2155  Shanon Rosser, MD 02/10/15 2243  Junius Creamer, NP 02/16/15 2008  Junius Creamer, NP 02/16/15 2012  Shanon Rosser, MD 02/26/15 [phone]

## 2015-02-11 ENCOUNTER — Encounter: Payer: Self-pay | Admitting: *Deleted

## 2015-02-12 ENCOUNTER — Ambulatory Visit
Admission: RE | Admit: 2015-02-12 | Discharge: 2015-02-12 | Disposition: A | Discharge status: Home / Self Care | Payer: Medicare Other | Source: Ambulatory Visit | Attending: Family Medicine | Admitting: Family Medicine

## 2015-02-12 DIAGNOSIS — Z1231 Encounter for screening mammogram for malignant neoplasm of breast: Secondary | ICD-10-CM | POA: Diagnosis not present

## 2015-04-16 ENCOUNTER — Ambulatory Visit (INDEPENDENT_AMBULATORY_CARE_PROVIDER_SITE_OTHER): Payer: Medicare Other | Admitting: Family Medicine

## 2015-04-16 ENCOUNTER — Encounter: Payer: Self-pay | Admitting: Family Medicine

## 2015-04-16 VITALS — BP 132/75 | HR 67 | Temp 98.3°F | Resp 18 | Ht 66.0 in | Wt 361.0 lb

## 2015-04-16 DIAGNOSIS — M1009 Idiopathic gout, multiple sites: Secondary | ICD-10-CM

## 2015-04-16 DIAGNOSIS — I10 Essential (primary) hypertension: Secondary | ICD-10-CM

## 2015-04-16 DIAGNOSIS — R7303 Prediabetes: Secondary | ICD-10-CM

## 2015-04-16 DIAGNOSIS — Z9981 Dependence on supplemental oxygen: Secondary | ICD-10-CM

## 2015-04-16 DIAGNOSIS — M79606 Pain in leg, unspecified: Secondary | ICD-10-CM | POA: Diagnosis not present

## 2015-04-16 DIAGNOSIS — I5032 Chronic diastolic (congestive) heart failure: Secondary | ICD-10-CM

## 2015-04-16 DIAGNOSIS — R7309 Other abnormal glucose: Secondary | ICD-10-CM

## 2015-04-16 DIAGNOSIS — M109 Gout, unspecified: Secondary | ICD-10-CM

## 2015-04-16 DIAGNOSIS — Z23 Encounter for immunization: Secondary | ICD-10-CM

## 2015-04-16 DIAGNOSIS — Z85118 Personal history of other malignant neoplasm of bronchus and lung: Secondary | ICD-10-CM

## 2015-04-16 DIAGNOSIS — D869 Sarcoidosis, unspecified: Secondary | ICD-10-CM

## 2015-04-16 LAB — BASIC METABOLIC PANEL
BUN: 26 mg/dL — AB (ref 7–25)
CO2: 31 mmol/L (ref 20–31)
CREATININE: 1.32 mg/dL — AB (ref 0.50–0.99)
Calcium: 9 mg/dL (ref 8.6–10.4)
Chloride: 99 mmol/L (ref 98–110)
GLUCOSE: 82 mg/dL (ref 65–99)
POTASSIUM: 4.2 mmol/L (ref 3.5–5.3)
Sodium: 140 mmol/L (ref 135–146)

## 2015-04-16 MED ORDER — FEBUXOSTAT 40 MG PO TABS: 40.0000 mg | ORAL_TABLET | Freq: Every day | ORAL | Status: DC

## 2015-04-16 MED ORDER — ACETAMINOPHEN 500 MG PO TABS: 500.0000 mg | ORAL_TABLET | Freq: Four times a day (QID) | ORAL | Status: DC | PRN

## 2015-04-16 MED ORDER — FUROSEMIDE 20 MG PO TABS
20.0000 mg | ORAL_TABLET | Freq: Two times a day (BID) | ORAL | Status: DC
Start: 2015-04-16 — End: 2015-07-16

## 2015-04-16 NOTE — Progress Notes (Signed)
Subjective:    Patient ID: Pamela [name], female    DOB: 03/06/1951, 64 y.o.   MRN: [id number]  Hypertension Pertinent negatives include no headaches or shortness of breath.    Pamela [name] is a 64 year old female with history of chronic diastolic CHF, gouty arthritis, HTN, left solitary kidney, and sarcodosis. She states that she feels well with minimal complaints.Pamela [name] has a history of sarcoidosis and lung cancer. She is presently controlled on steroid therapy. She states that last CT scan was in April 2015, which was stable. She states that her CT has been unchanged since 2014. She currently has shortness of breath on exertion. She is on continuous oxygen therapy at 2 L. She denies headache, fatigue, dizziness, chest pains, wheezing, fever, hemoptysis, or tachypnea  Patient has No headache, No chest pain, No abdominal pain - No Nausea, No new weakness tingling or numbness, No Cough - SOB Past Medical History  Diagnosis Date  . CHF (congestive heart failure)     Preserved EF  . Asthma   . COPD (chronic obstructive pulmonary disease)   . Arthritis   . Sarcoidosis   . Cancer     lung, adenocarcinoma right lung 2012  . Hypertension   . Sleep apnea   . Oxygen deficiency    Social History   Social History  . Marital Status: Divorced    Spouse Name: N/A  . Number of Children: 2  . Years of Education: N/A   Social History Main Topics  . Smoking status: Never Smoker   . Smokeless tobacco: Never Used  . Alcohol Use: No  . Drug Use: No  . Sexual Activity: Not on file   Other Topics Concern  . Not on file   Social History Narrative   Lives with son.  One son is deceased.     Allergies  Allergen Reactions  . Sulfur Rash   Immunization History  Administered Date(s) Administered  . Influenza,inj,Quad PF,36+ Mos 04/13/2014  . Pneumococcal Polysaccharide-23 04/13/2014  . Tdap 01/02/2015  . Zoster 05/21/2014   Review of Systems  Constitutional: Negative.   Negative for fever and fatigue.  HENT: Negative.   Eyes: Negative.  Negative for visual disturbance.  Respiratory: Positive for wheezing (occasionally). Negative for chest tightness and shortness of breath.   Cardiovascular: Negative.   Gastrointestinal: Negative.   Endocrine: Negative.  Negative for polydipsia, polyphagia and polyuria.  Genitourinary: Negative.   Skin: Negative.   Allergic/Immunologic: Negative.   Neurological: Negative.  Negative for light-headedness and headaches.  Hematological: Negative.   Psychiatric/Behavioral: Negative.  Negative for suicidal ideas.       Objective:   Physical Exam  Constitutional: She is oriented to person, place, and time. Vital signs are normal. She appears well-developed and well-nourished. Nasal cannula in place.  HENT:  Head: Normocephalic and atraumatic.  Right Ear: Hearing, tympanic membrane, external ear and ear canal normal.  Left Ear: Hearing, tympanic membrane, external ear and ear canal normal.  Mouth/Throat: Uvula is midline and oropharynx is clear and moist. Abnormal dentition. No dental caries.  Eyes: Conjunctivae and EOM are normal. Pupils are equal, round, and reactive to light. Lids are everted and swept, no foreign bodies found.  Neck: Trachea normal and normal range of motion. Neck supple.  Cardiovascular: Normal rate, regular rhythm, S1 normal, S2 normal, normal heart sounds and intact distal pulses.   Bilateral lower extremity edema  Pulmonary/Chest: Effort normal and breath sounds normal.  Abdominal: Soft. Normal appearance and  bowel sounds are normal.  Neurological: She is alert and oriented to person, place, and time. She has normal reflexes.  Skin: Skin is warm, dry and intact.  Hyperpigmentation to lower extremities  Psychiatric: She has a normal mood and affect. Her speech is normal and behavior is normal. Judgment and thought content normal. Cognition and memory are normal.         BP 132/75 mmHg  Pulse 67   Temp(Src) 98.3 F (36.8 C) (Oral)  Resp 18  Ht '5\' 6"'$  (1.676 m)  Wt 361 lb (163.749 kg)  BMI 58.29 kg/m2  SpO2 99% Assessment & Plan:   1. Chronic diastolic CHF (congestive heart failure)  - furosemide (LASIX) 20 MG tablet; Take 1 tablet (20 mg total) by mouth 2 (two) times daily.  Dispense: 180 tablet; Refill: 0  2. On home oxygen therapy Oxygen saturation is 98% on 2 liters of oxygen  3. Pain of lower extremity, unspecified laterality Patient states that pain is controlled on Tylenol 500 mg as needed.  - acetaminophen (TYLENOL) 500 MG tablet; Take 1 tablet (500 mg total) by mouth every 6 (six) hours as needed.  Dispense: 30 tablet; Refill: 2  4. Essential hypertension Blood pressure is at goal without medication. Patient is no longer on anti-hypertensive medications since starting DASH diet and increasing activity - Basic Metabolic Panel  5. History of lung cancer Patient has a history of lung cancer. She states that she had been evaluated yearly by oncologist at the [facility]. Last Ct of the chest with contrast was done on 11/15/2013. CT was stable and unchanged since 05/17/2013. Will send a referral to oncology for evaluation.   6. Prediabetes Reviewed previous labs, hemoglobin A1C 5.9, will re-check in 6 months. Continue current diet and activity regimen.   7. Sarcoidosis  Pamela [name] is followed by Dr. Christinia Gully, pulmonologist. Reviewed previous office note. Patient was continued on daily prednisone 10 mg and started [facility]. Will continue on 2 liters of oxygen. 8. Need for immunization against influenza  - Flu Vaccine QUAD 36+ mos IM (Fluarix)   9. Gouty arthritis Gout controlled on current medication regimen. Pamela [name] maintains that she has not had a gout attack since November 2015.  - febuxostat (ULORIC) 40 MG tablet; Take 1 tablet (40 mg total) by mouth daily.  Dispense: 90 tablet; Refill: 1   The patient was given clear instructions  to go to ER or return to medical center if symptoms do not improve, worsen or new problems develop. The patient verbalized understanding. Will notify patient with laboratory results. Pamela Dew, FNP

## 2015-04-16 NOTE — Patient Instructions (Signed)
Sarcoidosis, Schaumann's Disease, Sarcoid of [place] Sarcoidosis appears briefly and heals naturally in 60 to 70 percent of cases, often without the patient knowing or doing anything about it. 20 to 30 percent of patients with sarcoidosis are left with some permanent lung damage. In 10 to 15 percent of the patients, sarcoidosis can become chronic (long lasting). When either the granulomas or fibrosis seriously affect the function of a vital organ (lungs, heart, nervous system, liver, or kidneys), sarcoidosis can be fatal. This occurs 5 to 10 percent of the time. No one can predict how sarcoidosis will progress in an individual patient. The symptoms the patient experiences, the caregiver's findings, and the patient's race can give some clues. Sarcoidosis was once considered a rare disease. We now know that it is a common chronic illness that appears all over the world. It is the most common of the fibrotic (scarring) lung disorders. Anyone can get sarcoidosis. It occurs in all races and in both sexes. The risk is greater if you are a young black adult, especially a black woman, or are of Scandinavian, German, Irish, or Puerto [place]ican origin. In sarcoidosis, small lumps (also called nodules or granulomas) develop in multiple organs of the body. These granulomas are small collections of inflamed cells. They commonly appear in the lungs. This is the most common organ affected. They also occur in the lymph nodes (your glands), skin, liver, and eyes. The granulomas vary in the amount of disease they produce from very little with no problems (symptoms) to causing severe illness. The cause of sarcoidosis is not known. It may be due to an abnormal immune reaction in the body. Most people will recover. A few people will develop long lasting conditions that may get worse. Women are affected more often than men. The majority of those affected are under forty years of age. Because we do not know the cause, we do not have ways to  prevent it. SYMPTOMS   Fever.  Loss of appetite.  Night sweats.  Joint pain.  Aching muscles Symptoms vary because the disease affects different parts of the body in different people. Most people who see their caregiver with sarcoidosis have lung problems. The first signs are usually a dry cough and shortness of breath. There may also be wheezing, chest pain, or a cough that brings up bloody mucus. In severe cases, lung function may become so poor that the person cannot perform even the simple routine tasks of daily life. Other symptoms of sarcoidosis are less common than lung symptoms. They can include:  Skin symptoms. Sarcoidosis can appear as a collection of tender, red bumps called erythema nodosum. These bumps usually occur on the face, shins, and arms. They can also occur as a scaly, purplish discoloration on the nose, cheeks, and ears. This is called lupus pernio. Less often, sarcoidosis causes cysts, pimples, or disfiguring over growths of skin. In many cases, the disfiguring over growths develop in areas of scars or tattoos.  Eye symptoms. These include redness, eye pain, and sensitivity to light.  Heart symptoms. These include irregular heartbeat and heart failure.  Other symptoms. A person may have paralyzed facial muscles, seizures, psychiatric symptoms, swollen salivary glands, or bone pain. DIAGNOSIS  Even when there are no symptoms, your caregiver can sometimes pick up signs of sarcoidosis during a routine examination, usually through a chest x-ray or when checking other complaints. The patient's age and race or ethnic group can raise an additional red flag that a sign or symptom could   be related to sarcoidosis.   Enlargement of the salivary or tear glands and cysts in bone tissue may also be caused by sarcoidosis.  You may have had a biopsy done that shows signs of sarcoidosis. A biopsy is a small tissue sample that is removed for laboratory testing. This tissue sample can  be taken from your lung, skin, lip, or another inflamed or abnormal area of the body.  You may have had an abnormal chest X-ray. Although you appear healthy, a chest X-ray ordered for other reasons may turn up abnormalities that suggest sarcoidosis.  Other tests may be needed. These tests may be done to rule out other illnesses or to determine the amount of organ damage caused by sarcoidosis. Some of the most common tests are:  Blood levels of calcium or angiotensin-converting enzyme may be high in people with sarcoidosis.  Blood tests to evaluate how well your liver is functioning.  Lung function tests to measure how well you are breathing.  A complete eye examination. TREATMENT  If sarcoidosis does not cause any problems, treatment may not be necessary. Your caregiver may decide to simply monitor your condition. As part of this monitoring process, you may have frequent office visits, follow-up chest X-rays, and tests of your lung function.If you have signs of moderate or severe lung disease, your doctor may recommend:  A corticosteroid drug, such as prednisone (sold under several brand names).  Corticosteroids also are used to treat sarcoidosis of the eyes, joints, skin, nerves, or heart.  Corticosteroid eye drops may be used for the eyes.  Over-the-counter medications like nonsteroidal anti-inflammatory drugs (NSAID) often are used to treat joint pain first before corticosteroids, which tend to have more side effects.  If corticosteroids are not effective or cause serious side effects, other drugs that alter or suppress the immune system may be used.  In rare cases, when sarcoidosis causes life-threatening lung disease, a lung transplant may be necessary. However, there is some risk that the new lungs also will be attacked by sarcoidosis. SEEK IMMEDIATE MEDICAL CARE IF:   You suffer from shortness of breath or a lingering cough.  You develop new problems that may be related to the  disease. Remember this disease can affect almost all organs of the body and cause many different problems. Document Released: 05/06/2004 Document Revised: 09/28/2011 Document Reviewed: 11/01/2013 [facility] Patient Information 2015 [place], [place]. This information is not intended to replace advice given to you by your health care provider. Make sure you discuss any questions you have with your health care provider. DASH Eating Plan DASH stands for "Dietary Approaches to Stop Hypertension." The DASH eating plan is a healthy eating plan that has been shown to reduce high blood pressure (hypertension). Additional health benefits may include reducing the risk of type 2 diabetes mellitus, heart disease, and stroke. The DASH eating plan may also help with weight loss. WHAT DO I NEED TO KNOW ABOUT THE DASH EATING PLAN? For the DASH eating plan, you will follow these general guidelines:  Choose foods with a percent daily value for sodium of less than 5% (as listed on the food label).  Use salt-free seasonings or herbs instead of table salt or sea salt.  Check with your health care provider or pharmacist before using salt substitutes.  Eat lower-sodium products, often labeled as "lower sodium" or "no salt added."  Eat fresh foods.  Eat more vegetables, fruits, and low-fat dairy products.  Choose whole grains. Look for the word "whole" as the  first word in the ingredient list.  Choose fish and skinless chicken or [place] more often than red meat. Limit fish, poultry, and meat to 6 oz (170 g) each day.  Limit sweets, desserts, sugars, and sugary drinks.  Choose heart-healthy fats.  Limit cheese to 1 oz (28 g) per day.  Eat more home-cooked food and less restaurant, buffet, and fast food.  Limit fried foods.  Cook foods using methods other than frying.  Limit canned vegetables. If you do use them, rinse them well to decrease the sodium.  When eating at a restaurant, ask that your food be  prepared with less salt, or no salt if possible. WHAT FOODS CAN I EAT? Seek help from a dietitian for individual calorie needs. Grains Whole grain or whole wheat bread. Brown rice. Whole grain or whole wheat pasta. Quinoa, bulgur, and whole grain cereals. Low-sodium cereals. Corn or whole wheat flour tortillas. Whole grain cornbread. Whole grain crackers. Low-sodium crackers. Vegetables Fresh or frozen vegetables (raw, steamed, roasted, or grilled). Low-sodium or reduced-sodium tomato and vegetable juices. Low-sodium or reduced-sodium tomato sauce and paste. Low-sodium or reduced-sodium canned vegetables.  Fruits All fresh, canned (in natural juice), or frozen fruits. Meat and Other Protein Products Ground beef (85% or leaner), grass-fed beef, or beef trimmed of fat. Skinless chicken or [place]. Ground chicken or [place]. Pork trimmed of fat. All fish and seafood. Eggs. Dried beans, peas, or lentils. Unsalted nuts and seeds. Unsalted canned beans. Dairy Low-fat dairy products, such as skim or 1% milk, 2% or reduced-fat cheeses, low-fat ricotta or cottage cheese, or plain low-fat yogurt. Low-sodium or reduced-sodium cheeses. Fats and Oils Tub margarines without trans fats. Light or reduced-fat mayonnaise and salad dressings (reduced sodium). Avocado. Safflower, olive, or canola oils. Natural peanut or almond butter. Other Unsalted popcorn and pretzels. The items listed above may not be a complete list of recommended foods or beverages. Contact your dietitian for more options. WHAT FOODS ARE NOT RECOMMENDED? Grains White bread. White pasta. White rice. Refined cornbread. Bagels and croissants. Crackers that contain trans fat. Vegetables Creamed or fried vegetables. Vegetables in a cheese sauce. Regular canned vegetables. Regular canned tomato sauce and paste. Regular tomato and vegetable juices. Fruits Dried fruits. Canned fruit in light or heavy syrup. Fruit juice. Meat and Other Protein  Products Fatty cuts of meat. Ribs, chicken wings, bacon, sausage, bologna, salami, chitterlings, fatback, hot dogs, bratwurst, and packaged luncheon meats. Salted nuts and seeds. Canned beans with salt. Dairy Whole or 2% milk, cream, half-and-half, and cream cheese. Whole-fat or sweetened yogurt. Full-fat cheeses or blue cheese. Nondairy creamers and whipped toppings. Processed cheese, cheese spreads, or cheese curds. Condiments Onion and garlic salt, seasoned salt, table salt, and sea salt. Canned and packaged gravies. Worcestershire sauce. Tartar sauce. Barbecue sauce. Teriyaki sauce. Soy sauce, including reduced sodium. Steak sauce. Fish sauce. Oyster sauce. Cocktail sauce. Horseradish. Ketchup and mustard. Meat flavorings and tenderizers. Bouillon cubes. Hot sauce. Tabasco sauce. Marinades. Taco seasonings. Relishes. Fats and Oils Butter, stick margarine, lard, shortening, ghee, and bacon fat. Coconut, palm kernel, or palm oils. Regular salad dressings. Other Pickles and olives. Salted popcorn and pretzels. The items listed above may not be a complete list of foods and beverages to avoid. Contact your dietitian for more information. WHERE CAN I FIND MORE INFORMATION? National Heart, Lung, and Blood Institute: travelstabloid.com Document Released: 06/25/2011 Document Revised: 11/20/2013 Document Reviewed: 05/10/2013 [facility] Patient Information 2015 [place], [place]. This information is not intended to replace advice given to you  by your health care provider. Make sure you discuss any questions you have with your health care provider.

## 2015-04-17 ENCOUNTER — Telehealth: Payer: Self-pay | Admitting: Family Medicine

## 2015-04-17 DIAGNOSIS — R7989 Other specified abnormal findings of blood chemistry: Secondary | ICD-10-CM

## 2015-04-17 DIAGNOSIS — Q600 Renal agenesis, unilateral: Secondary | ICD-10-CM

## 2015-04-17 DIAGNOSIS — IMO0002 Reserved for concepts with insufficient information to code with codable children: Secondary | ICD-10-CM

## 2015-04-17 NOTE — Telephone Encounter (Signed)
Spoke with patient regarding labs and that referrals were being sent in. Patient verbalized understanding. Thanks!

## 2015-04-17 NOTE — Telephone Encounter (Signed)
Reviewed labs. Patient has a left solitary kidney, and labs show continuous elevated creatinine. Will send a referral to nephrology for evaluation.    Dorena Dew, FNP

## 2015-04-22 ENCOUNTER — Telehealth: Payer: Self-pay | Admitting: Hematology

## 2015-04-22 NOTE — Telephone Encounter (Signed)
New patient appt-s/w patient and gave np appt for 10/20 @ 10:45 w/Dr. Irene Limbo Referring Dr. Ballard Russell Dx-Hx of lung ca  Per patient will bring medical records on visits

## 2015-04-29 ENCOUNTER — Telehealth: Payer: Self-pay | Admitting: Internal Medicine

## 2015-04-29 NOTE — Telephone Encounter (Signed)
Please sent to [facility]. Thanks!

## 2015-04-29 NOTE — Telephone Encounter (Signed)
Pamela [name] from [facility] calling for medical records- needs 2 yrs of labs, and 8 office notes. Phone [phone] ext [phone]/ fax [phone] .

## 2015-05-09 ENCOUNTER — Encounter: Payer: Self-pay | Admitting: Hematology

## 2015-05-09 ENCOUNTER — Ambulatory Visit (HOSPITAL_BASED_OUTPATIENT_CLINIC_OR_DEPARTMENT_OTHER): Payer: Medicare Other | Admitting: Hematology

## 2015-05-09 ENCOUNTER — Other Ambulatory Visit (HOSPITAL_BASED_OUTPATIENT_CLINIC_OR_DEPARTMENT_OTHER): Payer: Medicare Other

## 2015-05-09 VITALS — BP 135/75 | HR 84 | Temp 98.4°F | Resp 19 | Ht 66.0 in | Wt 360.0 lb

## 2015-05-09 DIAGNOSIS — R222 Localized swelling, mass and lump, trunk: Secondary | ICD-10-CM

## 2015-05-09 DIAGNOSIS — Z85118 Personal history of other malignant neoplasm of bronchus and lung: Secondary | ICD-10-CM | POA: Diagnosis not present

## 2015-05-09 LAB — CBC & DIFF AND RETIC
BASO%: 0.6 % (ref 0.0–2.0)
Basophils Absolute: 0 10*3/uL (ref 0.0–0.1)
EOS ABS: 0.7 10*3/uL — AB (ref 0.0–0.5)
EOS%: 10.8 % — ABNORMAL HIGH (ref 0.0–7.0)
HCT: 35.1 % (ref 34.8–46.6)
HGB: 11 g/dL — ABNORMAL LOW (ref 11.6–15.9)
Immature Retic Fract: 7.5 % (ref 1.60–10.00)
LYMPH%: 22.5 % (ref 14.0–49.7)
MCH: 22.4 pg — ABNORMAL LOW (ref 25.1–34.0)
MCHC: 31.3 g/dL — AB (ref 31.5–36.0)
MCV: 71.3 fL — AB (ref 79.5–101.0)
MONO#: 0.5 10*3/uL (ref 0.1–0.9)
MONO%: 7.3 % (ref 0.0–14.0)
NEUT%: 58.8 % (ref 38.4–76.8)
NEUTROS ABS: 3.7 10*3/uL (ref 1.5–6.5)
Platelets: 149 10*3/uL (ref 145–400)
RBC: 4.92 10*6/uL (ref 3.70–5.45)
RDW: 15 % — AB (ref 11.2–14.5)
RETIC %: 1.34 % (ref 0.70–2.10)
Retic Ct Abs: 65.93 10*3/uL (ref 33.70–90.70)
WBC: 6.3 10*3/uL (ref 3.9–10.3)
lymph#: 1.4 10*3/uL (ref 0.9–3.3)

## 2015-05-09 LAB — COMPREHENSIVE METABOLIC PANEL (CC13)
ALBUMIN: 3.2 g/dL — AB (ref 3.5–5.0)
ALK PHOS: 254 U/L — AB (ref 40–150)
ALT: 33 U/L (ref 0–55)
ANION GAP: 7 meq/L (ref 3–11)
AST: 30 U/L (ref 5–34)
BUN: 28.4 mg/dL — ABNORMAL HIGH (ref 7.0–26.0)
CALCIUM: 8.9 mg/dL (ref 8.4–10.4)
CO2: 32 meq/L — AB (ref 22–29)
CREATININE: 1.5 mg/dL — AB (ref 0.6–1.1)
Chloride: 103 mEq/L (ref 98–109)
EGFR: 44 mL/min/{1.73_m2} — AB (ref >90)
Glucose: 83 mg/dl (ref 70–140)
Potassium: 3.7 mEq/L (ref 3.5–5.1)
Sodium: 143 mEq/L (ref 136–145)
TOTAL PROTEIN: 6.9 g/dL (ref 6.4–8.3)

## 2015-05-09 LAB — LACTATE DEHYDROGENASE (CC13): LDH: 122 U/L — AB (ref 125–245)

## 2015-05-09 NOTE — Progress Notes (Signed)
[name] Kitchen    HEMATOLOGY/ONCOLOGY CONSULTATION NOTE  Date of Service: 05/09/2015  Patient Care Team: Leana Gamer, MD as PCP - General (Internal Medicine)  CHIEF COMPLAINTS/PURPOSE OF CONSULTATION:  Transfer of care for lung adenocarcinoma from [facility] [place]:  Pamela [name] is a wonderful 64 y.o. female who has been referred to [place] by Dr .Leana Gamer., MD and Dr Daiva Eves [facility])  for for transfer of care given her previous history of lung adenocarcinoma.  Patient has a history of morbid obesity, COPD on 2 L of oxygen by nasal cannula, sarcoidosis that was previously being managed by Dr. Dion Saucier (pulmonary, [name], previously treated with steroids], sleep apnea.  Patient was getting follow-up imaging for her multisystem sarcoidosis involving mediastinal nodes abdominal nodes possibly her spleen. She was incidentally noted to have a right upper lobe spiculated lung nodule 2.2 (T1bN0M0) cm in greatest dimension in December 2012. This was biopsied accession #[id number]. Histologic features compatible with lung primary adenocarcinoma. Patient was thought not to be a good candidate for surgery and was referred to radiation oncology and had stereotactic body radiation therapy to a total dose of 40 grays in 4 fractions. She completed her treatment on 10/16/2011. She has been getting followed by Dr. Marquis Buggy for medical oncology and Dr. Erin Hearing for sarcoidosis. On further follow-up she had no evidence of recurrent lung cancer or metastatic disease. She was however noted to have a slowly growing mass like lesion in the right pectoralis minor muscle which was slowly growing and appeared to correlate with the previous biopsy tract sites as well as the radiation field. The concerns were possible radiation induced inflammation and myositis versus a tumor tracking along the needle track.  The case was to be discussed in tumor Board on 11/17/2013. She had a fine-needle aspiration of this right entered chest wall mass on 12/13/2013. The pathology results and what this showed is not known to [place] currently those records were not sent to [place]. Patient wonders if it was sarcoidosis on biopsy but is not sure. We have requested them again.  Patient recently moved to [place] to be closer to family and is therefore transferring her oncologic and other medical care is here. She has had a primary care physician and she'll be seeing Dr. Melvyn Novas for her pulmonary and sarcoidosis cares.  Patient notes no new shortness of breath. Continues to be on 2 L of oxygen by nasal cannula. Notes that her sarcoidosis has been in remission.  No weight loss. No change in breathing. No chest pain. No chest wall pain or discomfort. No other concerning new focal symptoms.      MEDICAL HISTORY:  Past Medical History  Diagnosis Date  . CHF (congestive heart failure) (HCC)     Preserved EF  . Asthma   . COPD (chronic obstructive pulmonary disease) ([place])   . Arthritis   . Sarcoidosis ([place])   . Cancer ([place])     lung, adenocarcinoma right lung 2012  . Hypertension   . Sleep apnea   . Oxygen deficiency    Congential single Kidney with CKD Gout on Uloric - tea as a trigger.  SURGICAL HISTORY: Past Surgical History  Procedure Laterality Date  . Lung biopsy    . Tubal ligation    . Vein ligation and stripping      SOCIAL HISTORY: Social History   Social History  . Marital Status: Divorced  Spouse Name: N/A  . Number of Children: 2  . Years of Education: N/A   Occupational History  . Not on file.   Social History Main Topics  . Smoking status: Never Smoker   . Smokeless tobacco: Never Used  . Alcohol Use: No  . Drug Use: No  . Sexual Activity: Not on file   Other Topics Concern  . Not on file   Social History Narrative   Lives with son.  One son is deceased.      FAMILY  HISTORY: Family History  Problem Relation Age of Onset  . Hypertension Mother   . Renal Disease Mother   . Cancer Father   . Heart disease Father     No details  . Cancer Brother   . Diabetes Brother   . Cervical cancer Sister   . Diabetes Sister   . Multiple myeloma Sister     ALLERGIES:  is allergic to sulfur.  MEDICATIONS:  Current Outpatient Prescriptions  Medication Sig Dispense Refill  . acetaminophen (TYLENOL) 500 MG tablet Take 1 tablet (500 mg total) by mouth every 6 (six) hours as needed. 30 tablet 2  . albuterol (PROAIR HFA) 108 (90 BASE) MCG/ACT inhaler Inhale 2 puffs into the lungs every 6 (six) hours as needed for wheezing or shortness of breath. 1 Inhaler 1  . aspirin 81 MG tablet Take 81 mg by mouth daily.    . budesonide-formoterol (SYMBICORT) 80-4.5 MCG/ACT inhaler Inhale 2 puffs into the lungs 2 (two) times daily. 1 Inhaler 5  . Calcium Carbonate-Vitamin D (CALTRATE 600+D PO) Take 1 tablet by mouth daily.     [name] Kitchen docusate sodium (COLACE) 100 MG capsule Take 100 mg by mouth 2 (two) times daily.    . febuxostat (ULORIC) 40 MG tablet Take 1 tablet (40 mg total) by mouth daily. 90 tablet 1  . ferrous sulfate 325 (65 FE) MG tablet Take 1 tablet (325 mg total) by mouth daily with breakfast. 90 tablet 0  . furosemide (LASIX) 20 MG tablet Take 1 tablet (20 mg total) by mouth 2 (two) times daily. 180 tablet 0  . predniSONE (DELTASONE) 10 MG tablet Take 1 tablet (10 mg total) by mouth daily with breakfast. 30 tablet 2   No current facility-administered medications for this visit.    REVIEW OF SYSTEMS:    10 Point review of Systems was done is negative except as noted above.  PHYSICAL EXAMINATION: ECOG PERFORMANCE STATUS: 2-3  . Filed Vitals:   05/09/15 1028  BP: 135/75  Pulse: 84  Temp: 98.4 F (36.9 C)  Resp: 19   Filed Weights   05/09/15 1028  Weight: 360 lb (163.295 kg)   .Body mass index is 58.13 kg/(m^2).  GENERAL:alert,  morbidly obese female in no  acute distress and comfortable SKIN: skin color, texture, turgor are normal, no rashes or significant lesions EYES: normal, conjunctiva are pink and non-injected, sclera clear OROPHARYNX:no exudate, no erythema and lips, buccal mucosa, and tongue normal  NECK: supple, no JVD, thyroid normal size, non-tender, without nodularity LYMPH:  no palpable lymphadenopathy in the cervical, axillary or inguinal LUNG: Distant breath sounds, a few basal rales, no rhonchi. HEART: regular rate & rhythm,  no murmurs and no lower extremity edema ABDOMEN: abdomen soft, non-tender, normoactive bowel sounds  Musculoskeletal: no cyanosis of digits and no clubbing  PSYCH: alert & oriented x 3 with fluent speech NEURO: no focal motor/sensory deficits  LABORATORY DATA:  I have reviewed the data as listed  .  CBC Latest Ref Rng 05/09/2015 10/03/2014 07/05/2014  WBC 3.9 - 10.3 10e3/uL 6.3 7.1 7.0  Hemoglobin 11.6 - 15.9 g/dL 11.0(L) 9.9(L) 9.2(L)  Hematocrit 34.8 - 46.6 % 35.1 32.1(L) 28.3(L)  Platelets 145 - 400 10e3/uL 149 168 206    . CMP Latest Ref Rng 05/09/2015 04/16/2015 01/02/2015  Glucose 70 - 140 mg/dl 83 82 98  BUN 7.0 - 26.0 mg/dL 28.4(H) 26(H) 24(H)  Creatinine 0.6 - 1.1 mg/dL 1.5(H) 1.32(H) 1.23(H)  Sodium 136 - 145 mEq/L 143 140 139  Potassium 3.5 - 5.1 mEq/L 3.7 4.2 4.2  Chloride 98 - 110 mmol/L - 99 100  CO2 22 - 29 mEq/L 32(H) 31 26  Calcium 8.4 - 10.4 mg/dL 8.9 9.0 8.5  Total Protein 6.4 - 8.3 g/dL 6.9 - 6.6  Total Bilirubin 0.20 - 1.20 mg/dL <0.30 - 0.3  Alkaline Phos 40 - 150 U/L 254(H) - 361(H)  AST 5 - 34 U/L 30 - 27  ALT 0 - 55 U/L 33 - 54(H)     RADIOGRAPHIC STUDIES: I have personally reviewed the radiological images as listed and agreed with the findings in the report. No results found.   Outside records   PET/CT scan with contrast 11/29/2013 Mediastinum: Thyroid and thoracic inlet or normal. No evidence of enlarged mediastinal, hilar or axillary lymph nodes. Heart size  is within normal limits. No pericardial effusion. Esophagus is normal.  Lung.: Continued maturation of post radiation changes in the right upper lobe. There is a small perifissural 4 mm nodule in the right upper lobe which is unchanged since 05/17/2013. Pleura: No effusions  no pneumothorax Bone and soft tissue: In the subcutaneous tissues exterior to the anterior third rib there is a 7.2 x 3.5 cm soft tissue mass centered in the right pectoralis minor muscle displacing the pectoralis major muscle. Given that this masslike thickening directly overlies the radiation changes in the lung this may be from prior radiation injury. Although felt to be less likely malignancy cannot be ruled out.  No definitive evidence of metastatic disease is identified in the abdomen or pelvis. Mottled hypermetabolic change within the liver and spleen is present. No CT correlate was seen. Some hypermetabolic changes noted within the posterior elements of the lumbar spine are likely degenerative.  ASSESSMENT & PLAN:   64 year old African-American female with   #1 history of right upper lobe T1b, N0, M0 primary lung adenocarcinoma most in December 2012. She was deemed not to be a surgical candidate. Treated by radiation oncology with SBRT at [facility]. Completed treatment in March 2013 and has been monitored since with no evidence of recurrence.  #2 right anterior chest wall slowly growing masslike lesion in the right pectoralis minor muscle. Unclear etiology. Concern for radiation myositis versus tumor tracking from needle track  Versus sarcoidosis versus other etiology. PLan -We'll get a repeat PET CT scan of the chest to restage the patient's lung cancer. This might also revealed the status of her sarcoidosis. -Importantly we also want to reassess her reported right anterior chest wall mass. -It appears based on outside records that this mass was biopsied on 12/13/2013 at the [facility].  Pathology results from this biopsy are not currently available to [place]. These have been requested. -Further treatment plan as per PET CT scan and review of her outside biopsy.  #3 history of extensive sarcoidosis involving lymphadenopathy in the chest, abdomen as well as spleen and possibly liver .previously treated with steroids by Dr. Bartholome Bill, pulmonary at  [facility] .has recently established care with Dr. Melvyn Novas for her pulmonary and sarcoidosis management.    #4 COPD on home oxygen    #5 sleep apnea  plan  -Follow-up with Dr. Melvyn Novas from pulmonary for further management of pulmonary issues and sarcoidosis .  #6 history of sickle cell trait. Hemoglobin that traverses results not currently available . -Patient is being seen by one of the sickle cell attendings Dr. [name] who happens to be her primary care physician .  Return to care in 3-4 weeks with Dr. Irene Limbo for review of PET scan and after we have the outside pathology results to define further lung cancer plan of care and evaluation of right chest wall mass.  All of the patients questions were answered  to her apparent satisfaction. The patient knows to call the clinic with any problems, questions or concerns.  I spent 60 minutes counseling the patient face to face. The total time spent in the appointment was 75 minutes and more than 50% was on counseling and direct patient cares.    Pamela Lone MD [place] AAHIVMS [facility] Hematology/Oncology [facility]  (Office):       [phone] (Work cell):  [phone] (Fax):           [phone]  05/09/2015 10:28 AM

## 2015-05-10 LAB — SEDIMENTATION RATE: Sed Rate: 38 mm/hr — ABNORMAL HIGH (ref 0–30)

## 2015-05-10 LAB — ANGIOTENSIN CONVERTING ENZYME: ANGIOTENSIN 1 CE: 64 U/L — AB (ref 8–52)

## 2015-05-13 ENCOUNTER — Inpatient Hospital Stay
Admission: RE | Admit: 2015-05-13 | Discharge: 2015-05-13 | Disposition: A | Discharge status: Home / Self Care | Payer: Self-pay | Source: Ambulatory Visit | Attending: Hematology | Admitting: Hematology

## 2015-05-13 ENCOUNTER — Inpatient Hospital Stay
Admission: RE | Admit: 2015-05-13 | Discharge: 2015-05-13 | Disposition: A | Discharge status: Home / Self Care | Payer: Medicare Other | Source: Ambulatory Visit | Attending: Hematology | Admitting: Hematology

## 2015-05-13 ENCOUNTER — Other Ambulatory Visit: Payer: Self-pay | Admitting: Hematology

## 2015-05-13 DIAGNOSIS — C801 Malignant (primary) neoplasm, unspecified: Secondary | ICD-10-CM

## 2015-05-14 ENCOUNTER — Other Ambulatory Visit: Payer: Self-pay | Admitting: Internal Medicine

## 2015-05-14 DIAGNOSIS — M109 Gout, unspecified: Secondary | ICD-10-CM

## 2015-05-14 MED ORDER — FEBUXOSTAT 40 MG PO TABS: 40.0000 mg | ORAL_TABLET | Freq: Every day | ORAL | Status: DC

## 2015-05-14 NOTE — Telephone Encounter (Signed)
Refill for uloric sent into pharmacy. Thanks!

## 2015-05-20 ENCOUNTER — Inpatient Hospital Stay
Admission: RE | Admit: 2015-05-20 | Discharge: 2015-05-20 | Disposition: A | Discharge status: Home / Self Care | Payer: Self-pay | Source: Ambulatory Visit | Attending: Hematology | Admitting: Hematology

## 2015-05-20 ENCOUNTER — Ambulatory Visit
Admission: RE | Admit: 2015-05-20 | Discharge: 2015-05-20 | Disposition: A | Discharge status: Home / Self Care | Payer: Self-pay | Source: Ambulatory Visit | Attending: Hematology | Admitting: Hematology

## 2015-05-20 ENCOUNTER — Other Ambulatory Visit: Payer: Self-pay | Admitting: Hematology

## 2015-05-20 ENCOUNTER — Encounter (HOSPITAL_COMMUNITY)
Admission: RE | Admit: 2015-05-20 | Discharge: 2015-05-20 | Disposition: A | Discharge status: Home / Self Care | Payer: Medicare Other | Source: Ambulatory Visit | Attending: Hematology | Admitting: Hematology

## 2015-05-20 DIAGNOSIS — C801 Malignant (primary) neoplasm, unspecified: Secondary | ICD-10-CM

## 2015-05-20 DIAGNOSIS — Z85118 Personal history of other malignant neoplasm of bronchus and lung: Secondary | ICD-10-CM | POA: Insufficient documentation

## 2015-05-20 DIAGNOSIS — C3490 Malignant neoplasm of unspecified part of unspecified bronchus or lung: Secondary | ICD-10-CM | POA: Diagnosis not present

## 2015-05-20 DIAGNOSIS — R222 Localized swelling, mass and lump, trunk: Secondary | ICD-10-CM

## 2015-05-20 LAB — GLUCOSE, CAPILLARY: GLUCOSE-CAPILLARY: 81 mg/dL (ref 65–99)

## 2015-05-20 MED ORDER — FLUDEOXYGLUCOSE F - 18 (FDG) INJECTION
16.2000 | Freq: Once | INTRAVENOUS | Status: DC | PRN
  Administered 2015-05-20: 16.2 via INTRAVENOUS
  Filled 2015-05-20: qty 16.2

## 2015-05-21 ENCOUNTER — Other Ambulatory Visit: Payer: Self-pay | Admitting: Hematology

## 2015-05-21 ENCOUNTER — Inpatient Hospital Stay
Admission: RE | Admit: 2015-05-21 | Discharge: 2015-05-21 | Disposition: A | Discharge status: Home / Self Care | Payer: Self-pay | Source: Ambulatory Visit | Attending: Hematology | Admitting: Hematology

## 2015-05-21 DIAGNOSIS — C801 Malignant (primary) neoplasm, unspecified: Secondary | ICD-10-CM

## 2015-05-28 ENCOUNTER — Encounter: Payer: Self-pay | Admitting: Hematology

## 2015-05-29 ENCOUNTER — Other Ambulatory Visit: Payer: Self-pay | Admitting: Internal Medicine

## 2015-05-29 DIAGNOSIS — D869 Sarcoidosis, unspecified: Secondary | ICD-10-CM

## 2015-05-29 MED ORDER — PREDNISONE 10 MG PO TABS: 10.0000 mg | ORAL_TABLET | Freq: Every day | ORAL | Status: DC

## 2015-05-29 NOTE — Telephone Encounter (Signed)
Refill for prednisone sent into pharmacy. Thanks!

## 2015-05-30 ENCOUNTER — Ambulatory Visit (HOSPITAL_BASED_OUTPATIENT_CLINIC_OR_DEPARTMENT_OTHER): Payer: Medicare Other | Admitting: Hematology

## 2015-05-30 ENCOUNTER — Telehealth: Payer: Self-pay | Admitting: Hematology

## 2015-05-30 VITALS — BP 149/63 | HR 87 | Temp 98.3°F | Resp 22 | Ht 66.0 in | Wt 359.1 lb

## 2015-05-30 DIAGNOSIS — R222 Localized swelling, mass and lump, trunk: Secondary | ICD-10-CM | POA: Diagnosis not present

## 2015-05-30 DIAGNOSIS — C3410 Malignant neoplasm of upper lobe, unspecified bronchus or lung: Secondary | ICD-10-CM

## 2015-05-30 DIAGNOSIS — Z85118 Personal history of other malignant neoplasm of bronchus and lung: Secondary | ICD-10-CM

## 2015-05-30 NOTE — Telephone Encounter (Signed)
Gave patient avs report and appointments for May 2017.  °

## 2015-06-04 ENCOUNTER — Encounter: Payer: Self-pay | Admitting: Hematology

## 2015-06-04 NOTE — Progress Notes (Signed)
Pamela [name]    HEMATOLOGY/ONCOLOGY CLINIC NOTE  Date of Service: .05/30/2015  Patient Care Team: Leana Gamer, MD as PCP - General (Internal Medicine)  CHIEF COMPLAINTS/PURPOSE OF CONSULTATION:  Transfer of care [facility] lung adenocarcinoma [facility] [place] 1. Right upper lobe T1b, N0, M0 primary lung adenocarcinoma most in December 2012. She was deemed not to be a surgical candidate. Treated by radiation oncology with SBRT at [facility]. Completed treatment in March 2013 and has been monitored since with no evidence of recurrence based on PET/CT scan done on 05/20/2015.  2.  Right anterior chest wall slowly growing masslike lesion in the right pectoralis minor muscle. Unclear etiology. Biopsy was done on 12/14/2013 at the [facility] [name]. FNA showed skeletal muscles, macrophages and spindle cells some with cytologic atypia. Needle core biopsy showed soft tissue and skeletal muscle with extensive necrosis; rare spindle cells with cytologic atypia. Treatment effect causing reactive atypia cannot be ruled out. Clinical correlation is advised to ensure the sample is representative. PET/CT scan done here on 05/20/2015 shows no change in metabolic activity or size of the medial right chest wall mass suggesting benign etiology.  INTERVAL HISTORY  Pamela [name] is here for her scheduled follow-up to discuss the results of her PET/CT scan. She notes no acute new concerns. Her breathing is stable. She is seeing Dr. Melvyn Novas from pulmonary To address her COPD and sarcoidosis. No new chest pain. No other new focal symptoms. We discussed in detail her PET/CT scan results which showed no clear evidence of lung cancer recurrence at this time. She still has a right anterior chest wall mass which remains unchanged in size and activity. Previous biopsy of the [place] showed extensive necrosis and some cytologic atypia which might be due to treatment effect.     MEDICAL HISTORY:  Past Medical History  Diagnosis Date  . CHF (congestive heart failure) (HCC)     Preserved EF  . Asthma   . COPD (chronic obstructive pulmonary disease) ([place])   . Arthritis   . Sarcoidosis ([place])   . Cancer ([place])     lung, adenocarcinoma right lung 2012  . Hypertension   . Sleep apnea   . Oxygen deficiency   . Lung cancer [facility]) 2012    Right upper lobe lung adenocarcinoma diagnosed with needle biopsy treated by SBRT finished treatment April 2013 has been monitored since  . Congenital single kidney     With chronic kidney disease  . Gout   . Mass of chest wall, right     Right chest wall mass 7.3 cm biopsy on 12/13/2013. Patient notes it was consistent with sarcoidosis but actual pathology results not available.  . Sickle cell trait ([place])    Congential single Kidney with CKD Gout on Uloric - tea as a trigger.  SURGICAL HISTORY: Past Surgical History  Procedure Laterality Date  . Lung biopsy    . Tubal ligation    . Vein ligation and stripping      SOCIAL HISTORY: Social History   Social History  . Marital Status: Divorced    Spouse Name: N/A  . Number of Children: 2  . Years of Education: N/A   Occupational History  . Not on file.   Social History Main Topics  . Smoking status: Never Smoker   . Smokeless tobacco: Never Used  . Alcohol Use: No  . Drug Use: No  . Sexual Activity: Not on file   Other  Topics Concern  . Not on file   Social History Narrative   Lives with son.  One son is deceased.      FAMILY HISTORY: Family History  Problem Relation Age of Onset  . Hypertension Mother   . Renal Disease Mother   . Cancer Father   . Heart disease Father     No details  . Cancer Brother   . Diabetes Brother   . Cervical cancer Sister   . Diabetes Sister   . Multiple myeloma Sister     ALLERGIES:  is allergic to sulfur.  MEDICATIONS:  Current Outpatient Prescriptions  Medication Sig Dispense Refill  . acetaminophen (TYLENOL)  500 MG tablet Take 1 tablet (500 mg total) by mouth every 6 (six) hours as needed. 30 tablet 2  . albuterol (PROAIR HFA) 108 (90 BASE) MCG/ACT inhaler Inhale 2 puffs into the lungs every 6 (six) hours as needed for wheezing or shortness of breath. 1 Inhaler 1  . aspirin 81 MG tablet Take 81 mg by mouth daily.    . budesonide-formoterol (SYMBICORT) 80-4.5 MCG/ACT inhaler Inhale 2 puffs into the lungs 2 (two) times daily. 1 Inhaler 5  . Calcium Carbonate-Vitamin D (CALTRATE 600+D PO) Take 1 tablet by mouth daily.     Pamela [name] docusate sodium (COLACE) 100 MG capsule Take 100 mg by mouth 2 (two) times daily.    . febuxostat (ULORIC) 40 MG tablet Take 1 tablet (40 mg total) by mouth daily. 90 tablet 1  . ferrous sulfate 325 (65 FE) MG tablet Take 1 tablet (325 mg total) by mouth daily with breakfast. 90 tablet 0  . furosemide (LASIX) 20 MG tablet Take 1 tablet (20 mg total) by mouth 2 (two) times daily. 180 tablet 0  . predniSONE (DELTASONE) 10 MG tablet Take 1 tablet (10 mg total) by mouth daily with breakfast. 30 tablet 2   No current facility-administered medications for this visit.    REVIEW OF SYSTEMS:    10 Point review of Systems was done is negative except as noted above.  PHYSICAL EXAMINATION: ECOG PERFORMANCE STATUS: 2-3  . Filed Vitals:   05/30/15 1004  BP: 149/63  Pulse: 87  Temp: 98.3 F (36.8 C)  Resp: 22   Filed Weights   05/30/15 1004  Weight: 359 lb 1.6 oz (162.887 kg)   .Body mass index is 57.99 kg/(m^2).  GENERAL:alert,  morbidly obese female in no acute distress and comfortable SKIN: skin color, texture, turgor are normal, no rashes or significant lesions EYES: normal, conjunctiva are pink and non-injected, sclera clear OROPHARYNX:no exudate, no erythema and lips, buccal mucosa, and tongue normal  NECK: supple, no JVD, thyroid normal size, non-tender, without nodularity LYMPH:  no palpable lymphadenopathy in the cervical, axillary or inguinal LUNG: Distant breath  sounds, a few basal rales, no rhonchi. HEART: regular rate & rhythm,  no murmurs and no lower extremity edema ABDOMEN: abdomen soft, non-tender, normoactive bowel sounds  Musculoskeletal: no cyanosis of digits and no clubbing  PSYCH: alert & oriented x 3 with fluent speech NEURO: no focal motor/sensory deficits  LABORATORY DATA:  I have reviewed the data as listed  . CBC Latest Ref Rng 05/09/2015 10/03/2014 07/05/2014  WBC 3.9 - 10.3 10e3/uL 6.3 7.1 7.0  Hemoglobin 11.6 - 15.9 g/dL 11.0(L) 9.9(L) 9.2(L)  Hematocrit 34.8 - 46.6 % 35.1 32.1(L) 28.3(L)  Platelets 145 - 400 10e3/uL 149 168 206   . CBC    Component Value Date/Time   WBC 6.3 05/09/2015 1127  WBC 7.1 10/03/2014 1628   RBC 4.92 05/09/2015 1127   RBC 4.56 10/03/2014 1628   RBC 4.01 05/25/2014 0000   HGB 11.0* 05/09/2015 1127   HGB 9.9* 10/03/2014 1628   HCT 35.1 05/09/2015 1127   HCT 32.1* 10/03/2014 1628   PLT 149 05/09/2015 1127   PLT 168 10/03/2014 1628   MCV 71.3* 05/09/2015 1127   MCV 70.4* 10/03/2014 1628   MCH 22.4* 05/09/2015 1127   MCH 21.7* 10/03/2014 1628   MCHC 31.3* 05/09/2015 1127   MCHC 30.8 10/03/2014 1628   RDW 15.0* 05/09/2015 1127   RDW 16.0* 10/03/2014 1628   LYMPHSABS 1.4 05/09/2015 1127   LYMPHSABS 0.6* 10/03/2014 1628   MONOABS 0.5 05/09/2015 1127   MONOABS 0.3 10/03/2014 1628   EOSABS 0.7* 05/09/2015 1127   EOSABS 0.1 10/03/2014 1628   BASOSABS 0.0 05/09/2015 1127   BASOSABS 0.0 10/03/2014 1628     . CMP Latest Ref Rng 05/09/2015 04/16/2015 01/02/2015  Glucose 70 - 140 mg/dl 83 82 98  BUN 7.0 - 26.0 mg/dL 28.4(H) 26(H) 24(H)  Creatinine 0.6 - 1.1 mg/dL 1.5(H) 1.32(H) 1.23(H)  Sodium 136 - 145 mEq/L 143 140 139  Potassium 3.5 - 5.1 mEq/L 3.7 4.2 4.2  Chloride 98 - 110 mmol/L - 99 100  CO2 22 - 29 mEq/L 32(H) 31 26  Calcium 8.4 - 10.4 mg/dL 8.9 9.0 8.5  Total Protein 6.4 - 8.3 g/dL 6.9 - 6.6  Total Bilirubin 0.20 - 1.20 mg/dL <0.30 - 0.3  Alkaline Phos 40 - 150 U/L 254(H) -  361(H)  AST 5 - 34 U/L 30 - 27  ALT 0 - 55 U/L 33 - 54(H)     RADIOGRAPHIC STUDIES: I have personally reviewed the radiological images as listed and agreed with the findings in the report. Nm Pet Image Restag (ps) Skull Base To Thigh  05/30/2015  CLINICAL DATA:  Subsequent treatment strategy for lung carcinoma. Evaluate chest wall mass. Patient history sarcoidosis. RIGHT upper lobe adenocarcinoma treated with SPRT. EXAM: NUCLEAR MEDICINE PET SKULL BASE TO THIGH TECHNIQUE: 16.2 mCi F-18 FDG was injected intravenously. Full-ring PET imaging was performed from the skull base to thigh after the radiotracer. CT data was obtained and used for attenuation correction and anatomic localization. FASTING BLOOD GLUCOSE:  Value: 81 mg/dl COMPARISON:  Outside PET-CT scan 11/29/2013 FINDINGS: NECK No hypermetabolic lymph nodes in the neck. Moderate metabolic activity associated with the salivary glands which is symmetric. CHEST RIGHT inferior medial chest wall mass is again demonstrated just superficial to the rib margin and beneath the pectoralis muscle measuring 6.7 by 3.4 cm unchanged in size from comparison PET-CT scan and not changed in metabolic activity with SUV max equal 4.5 compared to SUV max equal 4.6. No hypermetabolic axillary adenopathy. No hypermetabolic mediastinal adenopathy. No hypermetabolic or suspicious pulmonary nodules. Enlarged prevascular lymph node measuring 14 mm is unchanged and not metabolic. ABDOMEN/PELVIS No abnormal hypermetabolic activity within the liver, pancreas, adrenal glands, or spleen. No hypermetabolic lymph nodes in the abdomen or pelvis. SKELETON No focal hypermetabolic activity to suggest skeletal metastasis. IMPRESSION: 1. No change in metabolic activity or size of medial RIGHT chest wall mass suggests benign etiology. 2. No evidence of local lung cancer recurrence or metastasis on FDG PET scan. 3. Mild mediastinal adenopathy without metabolic activity may relate to patient's  history of sarcoidosis. Findings conveyed toGAUTAM Diondra [name] on 05/30/2015  at11:02. Electronically Signed   By: Suzy Bouchard M.D.   On: 05/30/2015 11:02  Outside records   PET/CT scan with contrast 11/29/2013 Mediastinum: Thyroid and thoracic inlet or normal. No evidence of enlarged mediastinal, hilar or axillary lymph nodes. Heart size is within normal limits. No pericardial effusion. Esophagus is normal.  Lung.: Continued maturation of post radiation changes in the right upper lobe. There is a small perifissural 4 mm nodule in the right upper lobe which is unchanged since 05/17/2013. Pleura: No effusions  no pneumothorax Bone and soft tissue: In the subcutaneous tissues exterior to the anterior third rib there is a 7.2 x 3.5 cm soft tissue mass centered in the right pectoralis minor muscle displacing the pectoralis major muscle. Given that this masslike thickening directly overlies the radiation changes in the lung this may be from prior radiation injury. Although felt to be less likely malignancy cannot be ruled out.  No definitive evidence of metastatic disease is identified in the abdomen or pelvis. Mottled hypermetabolic change within the liver and spleen is present. No CT correlate was seen. Some hypermetabolic changes noted within the posterior elements of the lumbar spine are likely degenerative.  ASSESSMENT & PLAN:   64 year old African-American female with   #1 history of right upper lobe T1b, N0, M0 primary lung adenocarcinoma most in December 2012. She was deemed not to be a surgical candidate. Treated by radiation oncology with SBRT at [facility]. Completed treatment in March 2013 and has been monitored since with no evidence of recurrence. no evidence of recurrence based on PET/CT scan done on 05/20/2015.  #2 Right anterior chest wall slowly growing masslike lesion in the right pectoralis minor muscle. Unclear etiology. FNA showed skeletal muscles, macrophages and  spindle cells some with cytologic atypia. Needle core biopsy showed soft tissue and skeletal muscle with extensive necrosis; rare spindle cells with cytologic atypia. Treatment effect causing reactive atypia cannot be ruled out. Clinical correlation is advised to ensure the sample is representative. PET/CT scan done here on 05/20/2015 shows no change in metabolic activity or size of the medial right chest wall mass suggesting benign etiology. Could certainly be related to her sarcoidosis . PLan -I discussed with the patient the option of biopsying the mass for a more definitive diagnosis versus monitoring it. She chooses to monitor it at this time given that imaging findings suggest a likely benign etiology that hasnt changed over the last 18 months.  #3 history of extensive sarcoidosis involving lymphadenopathy in the chest, abdomen as well as spleen and possibly liver .previously treated with steroids by Dr. Bartholome Bill, pulmonary at [facility] .has recently established care with Dr. Melvyn Novas for her pulmonary and sarcoidosis management.   PET/CT scan shows mediastinal adenopathy and right anterior chest wall mass which could certainly be related to her underlying history of sarcoidosis. Impotence and converting enzyme levels were slightly elevated at 64. #4 COPD on home oxygen    #5 sleep apnea  plan  -Follow-up with Dr. Melvyn Novas from pulmonary for further management of pulmonary issues and sarcoidosis .  #6 history of sickle cell trait. Hemoglobin electrophoresis is not available. Improving microcytic anemia. Could be related to iron deficiency or anemia of chronic disease from her sarcoidosis. Plan  -Continue on ferrous sulfate 1 tablet daily . -Repeat ferritin level with primary care physician in 4-6 weeks. -Adjust oral iron dose based on ferritin levels. -Would recommend treating to ferritin levels of more than 50 and preferably closer to 100.  Return to care in 6 months with Dr. Irene Limbo for  continued follow-up .earlier if any  acute new concerns .  All of the patients questions were answered  to her apparent satisfaction. The patient knows to call the clinic with any problems, questions or concerns.  I spent 20 minutes counseling the patient face to face. The total time spent in the appointment was 25 minutes and more than 50% was on counseling and direct patient cares.    Sullivan Lone MD [place] AAHIVMS [facility] Hematology/Oncology Physician [facility]  (Office):       [phone] (Work cell):  [phone] (Fax):           [phone]

## 2015-06-06 ENCOUNTER — Telehealth: Payer: Self-pay | Admitting: Internal Medicine

## 2015-06-06 ENCOUNTER — Ambulatory Visit (INDEPENDENT_AMBULATORY_CARE_PROVIDER_SITE_OTHER): Payer: Medicare Other | Admitting: Internal Medicine

## 2015-06-06 ENCOUNTER — Encounter: Payer: Self-pay | Admitting: Internal Medicine

## 2015-06-06 VITALS — BP 140/90 | HR 85 | Ht 66.0 in | Wt 355.0 lb

## 2015-06-06 DIAGNOSIS — J45998 Other asthma: Secondary | ICD-10-CM | POA: Diagnosis not present

## 2015-06-06 DIAGNOSIS — J9612 Chronic respiratory failure with hypercapnia: Secondary | ICD-10-CM

## 2015-06-06 DIAGNOSIS — J4521 Mild intermittent asthma with (acute) exacerbation: Secondary | ICD-10-CM

## 2015-06-06 DIAGNOSIS — I10 Essential (primary) hypertension: Secondary | ICD-10-CM | POA: Diagnosis not present

## 2015-06-06 DIAGNOSIS — J9611 Chronic respiratory failure with hypoxia: Secondary | ICD-10-CM

## 2015-06-06 MED ORDER — PREDNISONE 10 MG PO TABS: ORAL_TABLET | ORAL | Status: DC

## 2015-06-06 MED ORDER — ALBUTEROL SULFATE (2.5 MG/3ML) 0.083% IN NEBU: 2.5000 mg | INHALATION_SOLUTION | Freq: Four times a day (QID) | RESPIRATORY_TRACT | Status: DC | PRN

## 2015-06-06 NOTE — Assessment & Plan Note (Addendum)
pfts 07/31/13  FEV1  1.53 (58%) with ratio 66 > p saba ratio 74 FEV1 1.68 (64%)  - -04/27/2014 p extensive coaching HFA effectiveness =    75% > try dulera 100 2 bid  - PFT's 07/19/2014 FEV1 2.00 (93%) pand ratio 85 after 21% improvement from saba with no inhalers x one week  - trial off acei 12/01/2014 due to pseudoasthma component  Acute deterioration due to coming off prednisone (for gout) and now proving difficult to control  DDX of  difficult airways management all start with A and  include Adherence, Ace Inhibitors, Acid Reflux, Active Sinus Disease, Alpha 1 Antitripsin deficiency, Anxiety masquerading as Airways dz,  ABPA,  allergy(esp in young), Aspiration (esp in elderly), Adverse effects of meds,  Active smokers, A bunch of PE's (a small clot burden can't cause this syndrome unless there is already severe underlying pulm or vascular dz with poor reserve) plus two Bs  = Bronchiectasis and Beta blocker use..and one C= CHF.    Adherence is always the initial "prime suspect" and is a multilayered concern that requires a "trust but verify" approach in every patient - starting with knowing how to use medications, especially inhalers, correctly, keeping up with refills and understanding the fundamental difference between maintenance and prns vs those medications only taken for a very short course and then stopped and not refilled.  - The proper method of use, as well as anticipated side effects, of a metered-dose inhaler are discussed and demonstrated to the patient. Improved effectiveness after extensive coaching during this visit to a level of approximately  50% from a baseline of nearly 0   ? Acid (or non-acid) GERD > always difficult to exclude as up to 75% of pts in some series report no assoc GI/ Heartburn symptoms> rec max (24h)  acid suppression and diet restrictions/ reviewed and instructions given in writing.   ? Allergy > prednisone Take 4 for three days 3 for three days 2 for three days 1  for three days and stop then see if can maintain just on ics   I had an extended discussion with the patient reviewing all relevant studies completed to date and  lasting 25 minutes of a 40 minute acute extended visit    Each maintenance medication was reviewed in detail including most importantly the difference between maintenance and prns and under what circumstances the prns are to be triggered using an action plan format that is not reflected in the computer generated alphabetically organized AVS.    Please see instructions for details which were reviewed in writing and the patient given a copy highlighting the part that I personally wrote and discussed at today's ov.

## 2015-06-06 NOTE — Assessment & Plan Note (Signed)
Trial off acei 12/01/2014 due to pseudowheeze   Not ideal > Follow up per Primary Care planned

## 2015-06-06 NOTE — Assessment & Plan Note (Signed)
Complicated by hbp/ pre-dm  Body mass index is 57.33 kg/(m^2).  Lab Results  Component Value Date   TSH 3.934 05/21/2014     Contributing to gerd tendency/ doe/reviewed the need and the process to achieve and maintain neg calorie balance > defer f/u primary care including intermittently monitoring thyroid status

## 2015-06-06 NOTE — Assessment & Plan Note (Addendum)
See pfts 07/19/2014 with completely reversible airflow obst (so this is not copd) and a dlco of 71% so this is not ILD - HC03  32 05/09/15    rx of of 06/06/2015 = 2lpm 24/7 / well compensated

## 2015-06-06 NOTE — Telephone Encounter (Signed)
Dr Melvyn Novas- is ot ok to wait until your next avail 12/12 or do you want to overbook to see her? Please advise thanks

## 2015-06-06 NOTE — Progress Notes (Signed)
Subjective:    Patient ID: Pamela [name], female    DOB: [birth year]  MRN: [id number]   Brief patient profile:  66 yobf never smoker with 1st asthma attack in 1990s and on maint rx since late 90's maint rx and freq courses of prednisone 3-4 x per year despite advair and spiriva and freq saba referred to pulmonary clinic 04/27/2014 by Dr [name] s/p CT Bx 06/25/11 > no path report > RT only per pt RUL    History of Present Illness  04/27/2014 1st [facility] office visit/ Pamela [name]   Chief Complaint  Patient presents with  . Pulmonary Consult    Referred by Dr. [name]. [name]  Pt recently moved to [place] from [name] and states needs to establish with new pulmonary MD.  She c/o DOE and cough, and states that she feels these symptoms are currently under control.    on prednisone 10 mg daily  since late July 2015 and still on freq saba and 2lpm  Already used   2puffs proair am of ov  rec Stop spiriva and advair Start dulera 100 Take 2 puffs first thing in am and then another 2 puffs about 12 hours later.  Work on inhaler technique:  relax and gently blow all the way out then take a nice smooth deep breath back in, triggering the inhaler at same time you start breathing in.  Hold for up to 5 seconds if you can.  Rinse and gargle with water when done Only use your albuterol (proair) as a rescue medication  If proair not helping, then use the neb and if needing the neb more than occastional, then take prednisone 10 mg daily  Please schedule a follow up office visit in 4 weeks, sooner if needed with all inhalers in hand for pfts on return   07/19/2014 f/u ov/Pamela [name] re: no inhalers x one week / on ACEi / did not bring inhalers, did not use this am Chief Complaint  Patient presents with  . Follow-up    Pt states that her breathing is doing well and denies any new co's today. PFT was done today.  Not limited by breathing from desired  activities  But by wt and couldn't tell any change on dulera/ out x one week and not using saba either  rec Only use your albuterol as a rescue medication   If you start needing your rescue inhaler more than twice weekly for cough/wheeze/ short of breath the first step will be to get a substitute for lisinopril and return here if not better to restart dulera 100 Take 2 puffs first thing in am and then another 2 puffs about 12 hours later.     11/30/2014 f/u ov/Pamela [name] re: mild intermittent asthma ? Related to weather / still on acei plus on prednisone 10 mg daily for gout  Chief Complaint  Patient presents with  . Acute Visit    Pt c/o increased SOB for the past month. She is also coughing and wheezing. Cough is prod with minimal yellow to clear sputum. She uses proair 2 x daily on average.   Also due for CT chest to f/u dx of lung ca dx March 2013  never surgery/ RT only  Onset of dry cough / wheeze was insidious/ pattern is progressively worse day >> noct with increasing  need for saba but very poor hfa  rec Stop lisinopril and start valsartan 80  mg one daily instead  Only use your albuterol as a rescue medication   Restart dulera 100 Take 2 puffs first thing in am and then another 2 puffs about 12 hours later until you only need  your rescue inhaler no more than twice weekly for cough/wheeze/ short of breath  Follow up with Dr [name] as planned and only see me if needed       06/06/2015  f/u ov/Pamela [name] re: worse since finished pred ? One week prior to Pamela [name]  / was taking for gout  Chief Complaint  Patient presents with  . Acute Visit    Increased SOB with or without exertion for the past 4 days. She also c/o cough and wheezing. Cough is prod with clear sputum.      Was doing much better while on prednisone and gradually worse since off  Last used proventil about 7 am prior to OV  At 1130  On symbicort but hfa quite poor > see a/p Has neb but no meds for it   No obvious patterns in day  to day or daytime variabilty or assoc  cp or chest tightness,   overt sinus or hb symptoms. No unusual exp hx or h/o childhood pna/ asthma or knowledge of premature birth.  Sleeping ok without nocturnal  or early am exacerbation  of respiratory  c/o's or need for noct saba. Also denies any obvious fluctuation of symptoms with weather or environmental changes or other aggravating or alleviating factors except as outlined above   Current Medications, Allergies, Complete Past Medical History, Past Surgical History, Family History, and Social History were reviewed in [employer] record.  ROS  The following are not active complaints unless bolded sore throat, dysphagia, dental problems, itching, sneezing,  nasal congestion or excess/ purulent secretions, ear ache,   fever, chills, sweats, unintended wt loss, pleuritic or exertional cp, hemoptysis,  orthopnea pnd or leg swelling, presyncope, palpitations, heartburn, abdominal pain, anorexia, nausea, vomiting, diarrhea  or change in bowel or urinary habits, change in stools or urine, dysuria,hematuria,  rash, arthralgias, visual complaints, headache, numbness weakness or ataxia or problems with walking or coordination,  change in mood/affect or memory.           Objective:   Physical Exam  amb massively obese  Bf   does not want to get up on table     11/30/2014        343 > 06/06/2015  355     07/19/14 349 lb (158.305 kg)  07/05/14 354 lb (160.573 kg)  06/14/14 346 lb 11.2 oz (157.262 kg)    Vital signs reviewed      HEENT: nl dentition, turbinates, and orophanx. Nl external ear canals without cough reflex   NECK :  without JVD/Nodes/TM/ nl carotid upstrokes bilaterally   LUNGS: no acc muscle use, clear to A and P bilaterally with tight insp and exp  Wheeze and pseudowheeze    CV:  RRR  no s3 or murmur or increase in P2    ABD:  soft and nontender with limited excursion.    MS:  warm without deformities, calf  tenderness, cyanosis or clubbing  SKIN: warm and dry without lesions    Ext: wrapped/ bilateral sym swelling        CXR PA and Lateral:   06/06/2015 :   Ordered. Not done           Assessment & Plan:

## 2015-06-06 NOTE — Telephone Encounter (Signed)
07/01/15 is fine

## 2015-06-06 NOTE — Patient Instructions (Signed)
Prednisone Take 4 for three days 3 for three days 2 for three days 1 for three days and stop  Work on inhaler technique:  relax and gently blow all the way out then take a nice smooth deep breath back in, triggering the inhaler at same time you start breathing in.  Hold for up to 5 seconds if you can. Blow out thru nose. Rinse and gargle with water when done  Plan A = Automatic = symbicort 10 Take 2 puffs first thing in am and then another 2 puffs about 12 hours later.   Plan B = backup  Only use your albuterol(proair) as a rescue medication to be used if you can't catch your breath by resting or doing a relaxed purse lip breathing pattern.  - The less you use it, the better it will work when you need it. - Ok to use up to 2 puffs  every 4 hours if you must but call for immediate appointment if use goes up over your usual need - Don't leave home without it !!  (think of it like the spare tire for your car)   Plan C = crisis - only use nebulizer albuterol if you try the proair and it doesn't work   Please remember to go to the x-ray department downstairs for your tests - we will call you with the results when they are available.     Please schedule a follow up office visit in 2 weeks, sooner if needed

## 2015-06-07 ENCOUNTER — Ambulatory Visit (INDEPENDENT_AMBULATORY_CARE_PROVIDER_SITE_OTHER)
Admission: RE | Admit: 2015-06-07 | Discharge: 2015-06-07 | Disposition: A | Discharge status: Home / Self Care | Payer: Medicare Other | Source: Ambulatory Visit | Attending: Internal Medicine | Admitting: Internal Medicine

## 2015-06-07 DIAGNOSIS — R0602 Shortness of breath: Secondary | ICD-10-CM | POA: Diagnosis not present

## 2015-06-07 DIAGNOSIS — J4521 Mild intermittent asthma with (acute) exacerbation: Secondary | ICD-10-CM | POA: Diagnosis not present

## 2015-06-07 NOTE — Telephone Encounter (Signed)
lmtcb for pt.  

## 2015-06-07 NOTE — Telephone Encounter (Signed)
Pt cb to sched appt, states she needed morning appt, i have her sched for 12/13 at 11am, nothing further needed

## 2015-06-07 NOTE — Progress Notes (Signed)
Quick Note:  LMTCB ______ 

## 2015-06-10 NOTE — Progress Notes (Signed)
Quick Note:  lmtcb ______ 

## 2015-06-11 ENCOUNTER — Telehealth: Payer: Self-pay | Admitting: Internal Medicine

## 2015-06-11 NOTE — Telephone Encounter (Signed)
Results have been explained to patient, pt expressed understanding. Nothing further needed.  Notes Recorded by Tanda Rockers, MD on 06/07/2015 at 1:18 PM Call pt: Reviewed cxr and no acute change so no change in recommendations made at [facility]

## 2015-07-02 ENCOUNTER — Encounter: Payer: Self-pay | Admitting: Internal Medicine

## 2015-07-02 ENCOUNTER — Ambulatory Visit (INDEPENDENT_AMBULATORY_CARE_PROVIDER_SITE_OTHER): Payer: Medicare Other | Admitting: Internal Medicine

## 2015-07-02 VITALS — BP 110/68 | HR 66 | Ht 66.0 in | Wt 360.0 lb

## 2015-07-02 DIAGNOSIS — J4530 Mild persistent asthma, uncomplicated: Secondary | ICD-10-CM

## 2015-07-02 DIAGNOSIS — J9611 Chronic respiratory failure with hypoxia: Secondary | ICD-10-CM | POA: Diagnosis not present

## 2015-07-02 DIAGNOSIS — J9612 Chronic respiratory failure with hypercapnia: Secondary | ICD-10-CM

## 2015-07-02 NOTE — Progress Notes (Signed)
Subjective:    Patient ID: Pamela [name], female    DOB: 01-26-1951  MRN: [id number]   Brief patient profile:  67 yobf never smoker with 1st asthma attack in 1990s and on maint rx since late 90's maint rx and freq courses of prednisone 3-4 x per year despite advair and spiriva and freq saba referred to pulmonary clinic 04/27/2014 by Dr [name] s/p CT Bx 06/25/11 > no path report > RT only per pt RUL completed 2013     History of Present Illness  04/27/2014 1st [facility] office visit/ Harwood Nall   Chief Complaint  Patient presents with  . Pulmonary Consult    Referred by Dr. [name]. [name]  Pt recently moved to [place] from [name] and states needs to establish with new pulmonary MD.  She c/o DOE and cough, and states that she feels these symptoms are currently under control.    on prednisone 10 mg daily  since late July 2015 and still on freq saba and 2lpm  Already used   2puffs proair am of ov  rec Stop spiriva and advair Start dulera 100 Take 2 puffs first thing in am and then another 2 puffs about 12 hours later.  Work on inhaler technique:  relax and gently blow all the way out then take a nice smooth deep breath back in, triggering the inhaler at same time you start breathing in.  Hold for up to 5 seconds if you can.  Rinse and gargle with water when done Only use your albuterol (proair) as a rescue medication  If proair not helping, then use the neb and if needing the neb more than occastional, then take prednisone 10 mg daily  Please schedule a follow up office visit in 4 weeks, sooner if needed with all inhalers in hand for pfts on return   11/30/2014 f/u ov/Santia Labate re: mild intermittent asthma ? Related to weather / still on acei plus on prednisone 10 mg daily for gout  Chief Complaint  Patient presents with  . Acute Visit    Pt c/o increased SOB for the past month. She is also coughing and wheezing. Cough is prod with  minimal yellow to clear sputum. She uses proair 2 x daily on average.   Also due for CT chest to f/u dx of lung ca dx March 2013  never surgery/ RT only  Onset of dry cough / wheeze was insidious/ pattern is progressively worse day >> noct with increasing  need for saba but very poor hfa  rec Stop lisinopril and start valsartan 80 mg one daily instead  Only use your albuterol as a rescue medication   Restart dulera 100 Take 2 puffs first thing in am and then another 2 puffs about 12 hours later until you only need  your rescue inhaler no more than twice weekly for cough/wheeze/ short of breath  Follow up with Dr [name] as planned and only see me if needed    06/06/2015  f/u ov/Aminata Buffalo re: worse since finished pred ? One week prior to [place]  / was taking for gout  Chief Complaint  Patient presents with  . Acute Visit    Increased SOB with or without exertion for the past 4 days. She also c/o cough and wheezing. Cough is prod with clear sputum.     Was doing much better while on prednisone and gradually worse since off  Last used  proventil about 7 am prior to OV  At 1130  On symbicort but hfa quite poor > see a/p Has neb but no meds for it  rec Prednisone Take 4 for three days 3 for three days 2 for three days 1 for three days and stop Work on inhaler technique:  relax and gently blow all the way out then take a nice smooth deep breath back in, triggering the inhaler at same time you start breathing in.  Hold for up to 5 seconds if you can. Blow out thru nose. Rinse and gargle with water when done Plan A = Automatic = symbicort 80 Take 2 puffs first thing in am and then another 2 puffs about 12 hours later.  Plan B = backup  Only use your albuterol(proair)   Plan C = crisis - only use nebulizer albuterol if you try the proair and it doesn't work          07/02/2015  f/u ov/Zebediah Beezley re: asthma vs uacs on pred 10 for gout and symb 80 2bid  and no saba  Chief Complaint  Patient presents with  .  Follow-up    Breathing is much improved. She has not had to use rescue inhaler or neb.      No obvious patterns in day to day or daytime variabilty or assoc excess/ purulent sputum or mucus plugs    cp or chest tightness,   overt sinus or hb symptoms. No unusual exp hx or h/o childhood pna/ asthma or knowledge of premature birth.  Sleeping ok without nocturnal  or early am exacerbation  of respiratory  c/o's or need for noct saba. Also denies any obvious fluctuation of symptoms with weather or environmental changes or other aggravating or alleviating factors except as outlined above   Current Medications, Allergies, Complete Past Medical History, Past Surgical History, Family History, and Social History were reviewed in [employer] record.  ROS  The following are not active complaints unless bolded sore throat, dysphagia, dental problems, itching, sneezing,  nasal congestion or excess/ purulent secretions, ear ache,   fever, chills, sweats, unintended wt loss, pleuritic or exertional cp, hemoptysis,  orthopnea pnd or leg swelling, presyncope, palpitations, heartburn, abdominal pain, anorexia, nausea, vomiting, diarrhea  or change in bowel or urinary habits, change in stools or urine, dysuria,hematuria,  rash, arthralgias, visual complaints, headache, numbness weakness or ataxia or problems with walking due to wt or coordination,  change in mood/affect or memory.           Objective:   Physical Exam  amb massively obese  Bf   does not want to get up on table     11/30/2014        343 > 06/06/2015  355 > 07/03/2015  360     07/19/14 349 lb (158.305 kg)  07/05/14 354 lb (160.573 kg)  06/14/14 346 lb 11.2 oz (157.262 kg)    Vital signs reviewed      HEENT: nl dentition, turbinates, and orophanx. Nl external ear canals without cough reflex   NECK :  without JVD/Nodes/TM/ nl carotid upstrokes bilaterally   LUNGS: no acc muscle use, clear to A and P bilaterally  with tight insp and exp  Wheeze and pseudowheeze    CV:  RRR  no s3 or murmur or increase in P2    ABD:  Massively obese/ soft and nontender with limited excursion.    MS:  warm without deformities, calf tenderness, cyanosis or clubbing  SKIN: warm and dry without lesions    Ext: wrapped/ bilateral sym swelling         I personally reviewed images and agree with radiology impression as follows:  CXR:  06/07/15 Slight interval increase in the pulmonary interstitial markings especially on the left. There are no pulmonary parenchymal masses, nodules, or alveolar infiltrates. Stable right hilar prominence and right midlung parenchymal density.          Assessment & Plan:   Outpatient Encounter Prescriptions as of 07/02/2015  Medication Sig  . acetaminophen (TYLENOL) 500 MG tablet Take 1 tablet (500 mg total) by mouth every 6 (six) hours as needed.  [name] Kitchen albuterol (PROAIR HFA) 108 (90 BASE) MCG/ACT inhaler Inhale 2 puffs into the lungs every 6 (six) hours as needed for wheezing or shortness of breath.  [name] Kitchen albuterol (PROVENTIL) (2.5 MG/3ML) 0.083% nebulizer solution Take 3 mLs (2.5 mg total) by nebulization every 6 (six) hours as needed for wheezing or shortness of breath.  [name] Kitchen aspirin 81 MG tablet Take 81 mg by mouth daily.  . budesonide-formoterol (SYMBICORT) 80-4.5 MCG/ACT inhaler Inhale 2 puffs into the lungs 2 (two) times daily.  . Calcium Carbonate-Vitamin D (CALTRATE 600+D PO) Take 1 tablet by mouth daily.   [name] Kitchen docusate sodium (COLACE) 100 MG capsule Take 100 mg by mouth 2 (two) times daily.  . febuxostat (ULORIC) 40 MG tablet Take 1 tablet (40 mg total) by mouth daily.  . ferrous sulfate 325 (65 FE) MG tablet Take 1 tablet (325 mg total) by mouth daily with breakfast.  . furosemide (LASIX) 20 MG tablet Take 1 tablet (20 mg total) by mouth 2 (two) times daily.  . predniSONE (DELTASONE) 10 MG tablet Take 1 tablet (10 mg total) by mouth daily with breakfast.  . [DISCONTINUED]  predniSONE (DELTASONE) 10 MG tablet Take 4 for three days 3 for three days 2 for three days 1 for three days and stop   No facility-administered encounter medications on file as of 07/02/2015.

## 2015-07-02 NOTE — Patient Instructions (Signed)
Work on inhaler technique:  relax and gently blow all the way out then take a nice smooth deep breath back in, triggering the inhaler at same time you start breathing in.  Hold for up to 5 seconds if you can. Blow out thru nose. Rinse and gargle with water when done  If start needing more albuterol for any reason  in any form return here asap

## 2015-07-03 ENCOUNTER — Encounter: Payer: Self-pay | Admitting: Internal Medicine

## 2015-07-03 NOTE — Assessment & Plan Note (Signed)
Started 2lpm 24/7 in Pamela [name] around 2013  See pfts 07/19/2014 with completely reversible airflow obst (so this is not copd) and a dlco of 71% so this is not ILD - HC03  32 05/09/15    Adequate control on present rx, reviewed > no change in rx needed

## 2015-07-03 NOTE — Assessment & Plan Note (Addendum)
pfts 07/31/13  FEV1  1.53 (58%) with ratio 66 > p saba ratio 74 FEV1 1.68 (64%)  - -04/27/2014 p extensive coaching HFA effectiveness =    75% > try dulera 100 2 bid  - PFT's 07/19/2014 FEV1 2.00 (93%) pand ratio 85 after 21% improvement from saba with no inhalers x one week  - trial off acei 12/01/2014 due to pseudoasthma component > resolved    All goals of chronic asthma control met including optimal function and elimination of symptoms with minimal need for rescue therapy.  Contingencies discussed in full including contacting this office immediately if not controlling the symptoms using the rule of two's.     I had an extended final summary discussion with the patient reviewing all relevant studies completed to date and  lasting 15 to 20 minutes of a 25 minute visit    1) I reviewed in careful detail the concept that when she is taking prednisone for gout she is overtreating her asthma but there is a risk of under treating it as the prednisone is tapered off and if that's the case for sure she needs a higher dose of Symbicort namely the 160 does 2 puffs every 12 hours.  2) Each maintenance medication was reviewed in detail including most importantly the difference between maintenance and prns and under what circumstances the prns are to be triggered using an action plan format that is not reflected in the computer generated alphabetically organized AVS.    Please see instructions for details which were reviewed in writing and the patient given a copy highlighting the part that I personally wrote and discussed at today's ov.   Pulmonary follow-up can be prn

## 2015-07-16 ENCOUNTER — Ambulatory Visit (INDEPENDENT_AMBULATORY_CARE_PROVIDER_SITE_OTHER): Payer: Medicare Other | Admitting: Family Medicine

## 2015-07-16 VITALS — BP 122/46 | HR 71 | Temp 97.8°F | Resp 16 | Ht 66.0 in | Wt 358.0 lb

## 2015-07-16 DIAGNOSIS — IMO0002 Reserved for concepts with insufficient information to code with codable children: Secondary | ICD-10-CM

## 2015-07-16 DIAGNOSIS — R7303 Prediabetes: Secondary | ICD-10-CM

## 2015-07-16 DIAGNOSIS — R739 Hyperglycemia, unspecified: Secondary | ICD-10-CM | POA: Diagnosis not present

## 2015-07-16 DIAGNOSIS — I5032 Chronic diastolic (congestive) heart failure: Secondary | ICD-10-CM | POA: Diagnosis not present

## 2015-07-16 DIAGNOSIS — Q600 Renal agenesis, unilateral: Secondary | ICD-10-CM

## 2015-07-16 DIAGNOSIS — I10 Essential (primary) hypertension: Secondary | ICD-10-CM

## 2015-07-16 DIAGNOSIS — R7309 Other abnormal glucose: Secondary | ICD-10-CM | POA: Diagnosis not present

## 2015-07-16 DIAGNOSIS — Z9981 Dependence on supplemental oxygen: Secondary | ICD-10-CM

## 2015-07-16 LAB — COMPLETE METABOLIC PANEL WITH GFR
ALT: 40 U/L — ABNORMAL HIGH (ref 6–29)
AST: 43 U/L — ABNORMAL HIGH (ref 10–35)
Albumin: 3.4 g/dL — ABNORMAL LOW (ref 3.6–5.1)
Alkaline Phosphatase: 203 U/L — ABNORMAL HIGH (ref 33–130)
BILIRUBIN TOTAL: 0.5 mg/dL (ref 0.2–1.2)
BUN: 29 mg/dL — ABNORMAL HIGH (ref 7–25)
CHLORIDE: 100 mmol/L (ref 98–110)
CO2: 31 mmol/L (ref 20–31)
Calcium: 8.9 mg/dL (ref 8.6–10.4)
Creat: 1.44 mg/dL — ABNORMAL HIGH (ref 0.50–0.99)
GFR, EST AFRICAN AMERICAN: 45 mL/min — AB (ref >=60)
GFR, EST NON AFRICAN AMERICAN: 39 mL/min — AB (ref >=60)
GLUCOSE: 86 mg/dL (ref 65–99)
Potassium: 3.7 mmol/L (ref 3.5–5.3)
SODIUM: 138 mmol/L (ref 135–146)
TOTAL PROTEIN: 6.3 g/dL (ref 6.1–8.1)

## 2015-07-16 LAB — HEMOGLOBIN A1C
Hgb A1c MFr Bld: 5.9 % — ABNORMAL HIGH (ref <5.7)
MEAN PLASMA GLUCOSE: 123 mg/dL — AB (ref <117)

## 2015-07-16 MED ORDER — FUROSEMIDE 20 MG PO TABS: 20.0000 mg | ORAL_TABLET | Freq: Two times a day (BID) | ORAL | Status: DC

## 2015-07-16 NOTE — Progress Notes (Signed)
Subjective:    Patient ID: Pamela [name], female    DOB: January 28, 1951, 64 y.o.   MRN: [id number]  HPI  [name] is a 64 year old female with history of chronic diastolic CHF, gouty arthritis, HTN, left solitary kidney, and sarcodosis that presents for a 3 month follow up of hypertension and prediabetes. She states that she feels well with minimal complaints. She states that she has been following a low sodium diet and has increased her daily activity. She has been taking all medications consistently. She has recently followed up with Dr. Melvyn Novas, pulmonologist for sarcoidosis and history of asthma. She was also evaluated by Dr. Irene Limbo, oncology, she maintains that her follow up in in 6 months. Patient has no headache, chest pain, abdominal pain, dysuria, rectal bleeding, nausea, vomiting, diarrhea, or constipation. No new weakness tingling or numbness. She also denies cough, or shortness of breath. Ms. Hascall consistently wears 2 liters of oxygen via nasal cannula as prescribed.  Past Medical History  Diagnosis Date  . CHF (congestive heart failure) (HCC)     Preserved EF  . Asthma   . COPD (chronic obstructive pulmonary disease) ([place])   . Arthritis   . Sarcoidosis ([place])   . Cancer ([place])     lung, adenocarcinoma right lung 2012  . Hypertension   . Sleep apnea   . Oxygen deficiency   . Lung cancer [facility]) 2012    Right upper lobe lung adenocarcinoma diagnosed with needle biopsy treated by SBRT finished treatment April 2013 has been monitored since  . Congenital single kidney     With chronic kidney disease  . Gout   . Mass of chest wall, right     Right chest wall mass 7.3 cm biopsy on 12/13/2013. Patient notes it was consistent with sarcoidosis but actual pathology results not available.  . Sickle cell trait [facility])    Social History   Social History  . Marital Status: Divorced    Spouse Name: N/A  . Number of Children: 2  . Years of Education: N/A   Social History Main Topics   . Smoking status: Never Smoker   . Smokeless tobacco: Never Used  . Alcohol Use: No  . Drug Use: No  . Sexual Activity: Not on file   Other Topics Concern  . Not on file   Social History Narrative   Lives with son.  One son is deceased.     Allergies  Allergen Reactions  . Sulfur Rash   Immunization History  Administered Date(s) Administered  . Influenza,inj,Quad PF,36+ Mos 04/13/2014, 04/16/2015  . Pneumococcal Polysaccharide-23 04/13/2014  . Tdap 01/02/2015  . Zoster 05/21/2014   Review of Systems  Constitutional: Negative.  Negative for fever and fatigue.  HENT: Negative.   Eyes: Negative.  Negative for visual disturbance.  Respiratory: Positive for wheezing (occasionally). Negative for chest tightness and shortness of breath.   Cardiovascular: Negative.   Gastrointestinal: Negative.   Endocrine: Negative.  Negative for polydipsia, polyphagia and polyuria.  Genitourinary: Negative.   Musculoskeletal: Positive for myalgias.  Skin: Negative.   Allergic/Immunologic: Negative.   Neurological: Negative.  Negative for light-headedness and headaches.  Hematological: Negative.   Psychiatric/Behavioral: Negative.  Negative for suicidal ideas.       Objective:   Physical Exam  Constitutional: She is oriented to person, place, and time. Vital signs are normal. She appears well-developed and well-nourished. Nasal cannula in place.  HENT:  Head: Normocephalic and atraumatic.  Right Ear: Hearing, tympanic membrane,  external ear and ear canal normal.  Left Ear: Hearing, tympanic membrane, external ear and ear canal normal.  Mouth/Throat: Uvula is midline and oropharynx is clear and moist. Abnormal dentition. No dental caries.  Eyes: Conjunctivae and EOM are normal. Pupils are equal, round, and reactive to light. Lids are everted and swept, no foreign bodies found.  Neck: Trachea normal and normal range of motion. Neck supple.  Cardiovascular: Normal rate, regular rhythm, S1  normal, S2 normal, normal heart sounds and intact distal pulses.   Bilateral lower extremity edema  Pulmonary/Chest: Effort normal and breath sounds normal.  Abdominal: Soft. Normal appearance and bowel sounds are normal.  Increased abdominal girth  Neurological: She is alert and oriented to person, place, and time. She has normal reflexes.  Skin: Skin is warm, dry and intact.  Hyperpigmentation to lower extremities  Psychiatric: She has a normal mood and affect. Her speech is normal and behavior is normal. Judgment and thought content normal. Cognition and memory are normal.      BP 122/46 mmHg  Pulse 71  Temp(Src) 97.8 F (36.6 C) (Oral)  Resp 16  Ht '5\' 6"'$  (1.676 m)  Wt 358 lb (162.388 kg)  BMI 57.81 kg/m2  SpO2 100% Assessment & Plan:   1. Essential hypertension Blood pressure is at goal on current medication regimen. The patient is asked to make an attempt to improve diet and exercise patterns to aid in medical management of this problem. - COMPLETE METABOLIC PANEL WITH GFR - POCT urinalysis dipstick  2. Prediabetes Reviewed previous hemoglobin A1C, 5.9. She states that she has been following a low fat diet. She is unable to exercise due to shortness of breath.   3. Chronic diastolic CHF (congestive heart failure) (HCC)  - furosemide (LASIX) 20 MG tablet; Take 1 tablet (20 mg total) by mouth 2 (two) times daily.  Dispense: 180 tablet; Refill: 0  4. Hyperglycemia  - Hemoglobin A1c 5. On home oxygen therapy Oxygen saturation is 100 % on 2 liters of oxygen. Will continue. She is to follow up with pulmonologist as scheduled.  6. Solitary kidney Patient has a left solitary kidney referred patient to [facility] for further evaluation. She has has a decreased creatinine level. Will check creatine on today.   7. Morbid obesity, unspecified obesity type ([place]) Recommend a lowfat, low carbohydrate diet divided over 5-6 small meals, increase water intake to 2-3 glasses per  day       The patient was given clear instructions to go to ER or return to medical center if symptoms do not improve, worsen or new problems develop. The patient verbalized understanding. Will notify patient with laboratory results.  RTC: 3 months for chronic conditions   Declan Adamson M, FNP

## 2015-07-17 ENCOUNTER — Ambulatory Visit (INDEPENDENT_AMBULATORY_CARE_PROVIDER_SITE_OTHER): Payer: Medicare Other | Admitting: Podiatry

## 2015-07-17 ENCOUNTER — Encounter: Payer: Self-pay | Admitting: Podiatry

## 2015-07-17 VITALS — Ht 66.0 in | Wt 358.0 lb

## 2015-07-17 DIAGNOSIS — B351 Tinea unguium: Secondary | ICD-10-CM | POA: Diagnosis not present

## 2015-07-17 DIAGNOSIS — L600 Ingrowing nail: Secondary | ICD-10-CM | POA: Diagnosis not present

## 2015-07-17 DIAGNOSIS — M79606 Pain in leg, unspecified: Secondary | ICD-10-CM | POA: Diagnosis not present

## 2015-07-17 NOTE — Patient Instructions (Signed)
Seen for hypertrophic nails. All nails debrided. Return in 3 months or as needed.  
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
## 2015-07-18 ENCOUNTER — Encounter: Payer: Self-pay | Admitting: Family Medicine

## 2015-07-18 DIAGNOSIS — IMO0002 Reserved for concepts with insufficient information to code with codable children: Secondary | ICD-10-CM | POA: Insufficient documentation

## 2015-07-18 DIAGNOSIS — Q600 Renal agenesis, unilateral: Secondary | ICD-10-CM | POA: Insufficient documentation

## 2015-07-24 LAB — POCT URINALYSIS DIPSTICK
BILIRUBIN UA: NEGATIVE
Glucose, UA: NEGATIVE
KETONES UA: NEGATIVE
LEUKOCYTES UA: NEGATIVE
Nitrite, UA: NEGATIVE
PH UA: 5
Protein, UA: NEGATIVE
RBC UA: NEGATIVE
SPEC GRAV UA: 1.01
Urobilinogen, UA: 0.2

## 2015-07-31 DIAGNOSIS — H35031 Hypertensive retinopathy, right eye: Secondary | ICD-10-CM | POA: Diagnosis not present

## 2015-07-31 DIAGNOSIS — D869 Sarcoidosis, unspecified: Secondary | ICD-10-CM | POA: Diagnosis not present

## 2015-07-31 DIAGNOSIS — H2513 Age-related nuclear cataract, bilateral: Secondary | ICD-10-CM | POA: Diagnosis not present

## 2015-07-31 DIAGNOSIS — H25013 Cortical age-related cataract, bilateral: Secondary | ICD-10-CM | POA: Diagnosis not present

## 2015-09-20 ENCOUNTER — Telehealth: Payer: Self-pay | Admitting: *Deleted

## 2015-09-20 DIAGNOSIS — I5032 Chronic diastolic (congestive) heart failure: Secondary | ICD-10-CM

## 2015-09-20 DIAGNOSIS — D869 Sarcoidosis, unspecified: Secondary | ICD-10-CM

## 2015-09-20 MED ORDER — FUROSEMIDE 20 MG PO TABS: 20.0000 mg | ORAL_TABLET | Freq: Two times a day (BID) | ORAL | Status: DC

## 2015-09-20 MED ORDER — PREDNISONE 10 MG PO TABS: 10.0000 mg | ORAL_TABLET | Freq: Every day | ORAL | Status: DC

## 2015-09-20 NOTE — Telephone Encounter (Signed)
Refills have been sent into pharmacy. Thanks!  

## 2015-09-20 NOTE — Telephone Encounter (Signed)
Pt called and needs a refill of her Lasix and her Prednisone. Her pharmacy is [facility] on [address] . Please advise provider. Thanks

## 2015-10-01 ENCOUNTER — Ambulatory Visit: Payer: Medicare Other | Admitting: Family Medicine

## 2015-10-08 ENCOUNTER — Ambulatory Visit (INDEPENDENT_AMBULATORY_CARE_PROVIDER_SITE_OTHER): Payer: Medicare Other | Admitting: Family Medicine

## 2015-10-08 ENCOUNTER — Encounter: Payer: Self-pay | Admitting: Family Medicine

## 2015-10-08 VITALS — BP 122/62 | HR 67 | Temp 97.9°F | Resp 18 | Ht 66.0 in | Wt 362.0 lb

## 2015-10-08 DIAGNOSIS — Z9981 Dependence on supplemental oxygen: Secondary | ICD-10-CM

## 2015-10-08 DIAGNOSIS — Q600 Renal agenesis, unilateral: Secondary | ICD-10-CM | POA: Diagnosis not present

## 2015-10-08 DIAGNOSIS — I10 Essential (primary) hypertension: Secondary | ICD-10-CM

## 2015-10-08 DIAGNOSIS — IMO0002 Reserved for concepts with insufficient information to code with codable children: Secondary | ICD-10-CM

## 2015-10-08 LAB — COMPLETE METABOLIC PANEL WITH GFR
ALK PHOS: 183 U/L — AB (ref 33–130)
ALT: 24 U/L (ref 6–29)
AST: 27 U/L (ref 10–35)
Albumin: 3.4 g/dL — ABNORMAL LOW (ref 3.6–5.1)
BILIRUBIN TOTAL: 0.3 mg/dL (ref 0.2–1.2)
BUN: 29 mg/dL — AB (ref 7–25)
CALCIUM: 8.2 mg/dL — AB (ref 8.6–10.4)
CHLORIDE: 103 mmol/L (ref 98–110)
CO2: 29 mmol/L (ref 20–31)
CREATININE: 1.58 mg/dL — AB (ref 0.50–0.99)
GFR, Est African American: 40 mL/min — ABNORMAL LOW (ref >=60)
GFR, Est Non African American: 35 mL/min — ABNORMAL LOW (ref >=60)
GLUCOSE: 93 mg/dL (ref 65–99)
POTASSIUM: 4.3 mmol/L (ref 3.5–5.3)
Sodium: 139 mmol/L (ref 135–146)
Total Protein: 6.2 g/dL (ref 6.1–8.1)

## 2015-10-08 LAB — POCT URINALYSIS DIP (DEVICE)
Bilirubin Urine: NEGATIVE
Glucose, UA: NEGATIVE mg/dL
HGB URINE DIPSTICK: NEGATIVE
Ketones, ur: NEGATIVE mg/dL
LEUKOCYTES UA: NEGATIVE
NITRITE: NEGATIVE
PH: 5.5 (ref 5.0–8.0)
Protein, ur: NEGATIVE mg/dL
Specific Gravity, Urine: <=1.005 (ref 1.005–1.030)
UROBILINOGEN UA: 0.2 mg/dL (ref 0.0–1.0)

## 2015-10-08 NOTE — Patient Instructions (Signed)
DASH Eating Plan °DASH stands for "Dietary Approaches to Stop Hypertension." The DASH eating plan is a healthy eating plan that has been shown to reduce high blood pressure (hypertension). Additional health benefits may include reducing the risk of type 2 diabetes mellitus, heart disease, and stroke. The DASH eating plan may also help with weight loss. °WHAT DO I NEED TO KNOW ABOUT THE DASH EATING PLAN? °For the DASH eating plan, you will follow these general guidelines: °· Choose foods with a percent daily value for sodium of less than 5% (as listed on the food label). °· Use salt-free seasonings or herbs instead of table salt or sea salt. °· Check with your health care provider or pharmacist before using salt substitutes. °· Eat lower-sodium products, often labeled as "lower sodium" or "no salt added." °· Eat fresh foods. °· Eat more vegetables, fruits, and low-fat dairy products. °· Choose whole grains. Look for the word "whole" as the first word in the ingredient list. °· Choose fish and skinless chicken or turkey more often than red meat. Limit fish, poultry, and meat to 6 oz (170 g) each day. °· Limit sweets, desserts, sugars, and sugary drinks. °· Choose heart-healthy fats. °· Limit cheese to 1 oz (28 g) per day. °· Eat more home-cooked food and less restaurant, buffet, and fast food. °· Limit fried foods. °· Cook foods using methods other than frying. °· Limit canned vegetables. If you do use them, rinse them well to decrease the sodium. °· When eating at a restaurant, ask that your food be prepared with less salt, or no salt if possible. °WHAT FOODS CAN I EAT? °Seek help from a dietitian for individual calorie needs. °Grains °Whole grain or whole wheat bread. Brown rice. Whole grain or whole wheat pasta. Quinoa, bulgur, and whole grain cereals. Low-sodium cereals. Corn or whole wheat flour tortillas. Whole grain cornbread. Whole grain crackers. Low-sodium crackers. °Vegetables °Fresh or frozen vegetables  (raw, steamed, roasted, or grilled). Low-sodium or reduced-sodium tomato and vegetable juices. Low-sodium or reduced-sodium tomato sauce and paste. Low-sodium or reduced-sodium canned vegetables.  °Fruits °All fresh, canned (in natural juice), or frozen fruits. °Meat and Other Protein Products °Ground beef (85% or leaner), grass-fed beef, or beef trimmed of fat. Skinless chicken or turkey. Ground chicken or turkey. Pork trimmed of fat. All fish and seafood. Eggs. Dried beans, peas, or lentils. Unsalted nuts and seeds. Unsalted canned beans. °Dairy °Low-fat dairy products, such as skim or 1% milk, 2% or reduced-fat cheeses, low-fat ricotta or cottage cheese, or plain low-fat yogurt. Low-sodium or reduced-sodium cheeses. °Fats and Oils °Tub margarines without trans fats. Light or reduced-fat mayonnaise and salad dressings (reduced sodium). Avocado. Safflower, olive, or canola oils. Natural peanut or almond butter. °Other °Unsalted popcorn and pretzels. °The items listed above may not be a complete list of recommended foods or beverages. Contact your dietitian for more options. °WHAT FOODS ARE NOT RECOMMENDED? °Grains °White bread. White pasta. White rice. Refined cornbread. Bagels and croissants. Crackers that contain trans fat. °Vegetables °Creamed or fried vegetables. Vegetables in a cheese sauce. Regular canned vegetables. Regular canned tomato sauce and paste. Regular tomato and vegetable juices. °Fruits °Dried fruits. Canned fruit in light or heavy syrup. Fruit juice. °Meat and Other Protein Products °Fatty cuts of meat. Ribs, chicken wings, bacon, sausage, bologna, salami, chitterlings, fatback, hot dogs, bratwurst, and packaged luncheon meats. Salted nuts and seeds. Canned beans with salt. °Dairy °Whole or 2% milk, cream, half-and-half, and cream cheese. Whole-fat or sweetened yogurt. Full-fat   cheeses or blue cheese. Nondairy creamers and whipped toppings. Processed cheese, cheese spreads, or cheese  curds. °Condiments °Onion and garlic salt, seasoned salt, table salt, and sea salt. Canned and packaged gravies. Worcestershire sauce. Tartar sauce. Barbecue sauce. Teriyaki sauce. Soy sauce, including reduced sodium. Steak sauce. Fish sauce. Oyster sauce. Cocktail sauce. Horseradish. Ketchup and mustard. Meat flavorings and tenderizers. Bouillon cubes. Hot sauce. Tabasco sauce. Marinades. Taco seasonings. Relishes. °Fats and Oils °Butter, stick margarine, lard, shortening, ghee, and bacon fat. Coconut, palm kernel, or palm oils. Regular salad dressings. °Other °Pickles and olives. Salted popcorn and pretzels. °The items listed above may not be a complete list of foods and beverages to avoid. Contact your dietitian for more information. °WHERE CAN I FIND MORE INFORMATION? °[employer], [employer], [employer]: www.nhlbi.nih.gov/health/health-topics/topics/dash/ °  °This information is not intended to replace advice given to you by your health care provider. Make sure you discuss any questions you have with your health care provider. °  °Document Released: 06/25/2011 Document Revised: 07/27/2014 Document Reviewed: 05/10/2013 °Elsevier Interactive Patient Education ©2016 [employer] ° °Hypertension °Hypertension, commonly called high blood pressure, is when the force of blood pumping through your arteries is too strong. Your arteries are the blood vessels that carry blood from your heart throughout your body. A blood pressure reading consists of a higher number over a lower number, such as 110/72. The higher number (systolic) is the pressure inside your arteries when your heart pumps. The lower number (diastolic) is the pressure inside your arteries when your heart relaxes. Ideally you want your blood pressure below 120/80. °Hypertension forces your heart to work harder to pump blood. Your arteries may become narrow or stiff. Having untreated or uncontrolled hypertension can cause heart attack, stroke, kidney  disease, and other problems. °RISK FACTORS °Some risk factors for high blood pressure are controllable. Others are not.  °Risk factors you cannot control include:  °· Race. You may be at higher risk if you are African American. °· Age. Risk increases with age. °· Gender. Men are at higher risk than women before age 45 years. After age 65, women are at higher risk than men. °Risk factors you can control include: °· Not getting enough exercise or physical activity. °· Being overweight. °· Getting too much fat, sugar, calories, or salt in your diet. °· Drinking too much alcohol. °SIGNS AND SYMPTOMS °Hypertension does not usually cause signs or symptoms. Extremely high blood pressure (hypertensive crisis) may cause headache, anxiety, shortness of breath, and nosebleed. °DIAGNOSIS °To check if you have hypertension, your health care provider will measure your blood pressure while you are seated, with your arm held at the level of your heart. It should be measured at least twice using the same arm. Certain conditions can cause a difference in blood pressure between your right and left arms. A blood pressure reading that is higher than normal on one occasion does not mean that you need treatment. If it is not clear whether you have high blood pressure, you may be asked to return on a different day to have your blood pressure checked again. Or, you may be asked to monitor your blood pressure at home for 1 or more weeks. °TREATMENT °Treating high blood pressure includes making lifestyle changes and possibly taking medicine. Living a healthy lifestyle can help lower high blood pressure. You may need to change some of your habits. °Lifestyle changes may include: °· Following the DASH diet. This diet is high in fruits, vegetables, and whole   grains. It is low in salt, red meat, and added sugars. °· Keep your sodium intake below 2,300 mg per day. °· Getting at least 30-45 minutes of aerobic exercise at least 4 times per  week. °· Losing weight if necessary. °· Not smoking. °· Limiting alcoholic beverages. °· Learning ways to reduce stress. °Your health care provider may prescribe medicine if lifestyle changes are not enough to get your blood pressure under control, and if one of the following is true: °· You are 18-59 years of age and your systolic blood pressure is above 140. °· You are 60 years of age or older, and your systolic blood pressure is above 150. °· Your diastolic blood pressure is above 90. °· You have diabetes, and your systolic blood pressure is over 140 or your diastolic blood pressure is over 90. °· You have kidney disease and your blood pressure is above 140/90. °· You have heart disease and your blood pressure is above 140/90. °Your personal target blood pressure may vary depending on your medical conditions, your age, and other factors. °HOME CARE INSTRUCTIONS °· Have your blood pressure rechecked as directed by your health care provider.   °· Take medicines only as directed by your health care provider. Follow the directions carefully. Blood pressure medicines must be taken as prescribed. The medicine does not work as well when you skip doses. Skipping doses also puts you at risk for problems. °· Do not smoke.   °· Monitor your blood pressure at home as directed by your health care provider.  °SEEK MEDICAL CARE IF:  °· You think you are having a reaction to medicines taken. °· You have recurrent headaches or feel dizzy. °· You have swelling in your ankles. °· You have trouble with your vision. °SEEK IMMEDIATE MEDICAL CARE IF: °· You develop a severe headache or confusion. °· You have unusual weakness, numbness, or feel faint. °· You have severe chest or abdominal pain. °· You vomit repeatedly. °· You have trouble breathing. °MAKE SURE YOU:  °· Understand these instructions. °· Will watch your condition. °· Will get help right away if you are not doing well or get worse. °  °This information is not intended to  replace advice given to you by your health care provider. Make sure you discuss any questions you have with your health care provider. °  °Document Released: 07/06/2005 Document Revised: 11/20/2014 Document Reviewed: 04/28/2013 °Elsevier Interactive Patient Education ©2016 [employer] Inc. ° °

## 2015-10-08 NOTE — Progress Notes (Signed)
Subjective:    Patient ID: Pamela [name], female    DOB: 1951/06/27, 66 y.o.   MRN: [id number]  HPI  Pamela [name] is a 65 year old female with history of chronic diastolic CHF, gouty arthritis, HTN, left solitary kidney, and sarcodosis that presents for a 3 month follow up of hypertension.  She states that she feels well with minimal complaints. She states that she has been following a low sodium diet and has increased her daily activity. She has been taking all medications consistently. She has recently followed up with Dr. Melvyn Novas, pulmonologist for sarcoidosis and history of asthma. She was also evaluated by Dr. Irene Limbo, oncology, she maintains that her follow up is in 3 months. Patient has no headache, chest pain, abdominal pain, dysuria, rectal bleeding, nausea, vomiting, diarrhea, or constipation. No new weakness tingling or numbness. She also denies cough, or shortness of breath.   Pamela [name] consistently wears 2 liters of oxygen via nasal cannula as prescribed.  Past Medical History  Diagnosis Date  . CHF (congestive heart failure) (HCC)     Preserved EF  . Asthma   . COPD (chronic obstructive pulmonary disease) ([place])   . Arthritis   . Sarcoidosis ([place])   . Cancer ([place])     lung, adenocarcinoma right lung 2012  . Hypertension   . Sleep apnea   . Oxygen deficiency   . 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 Congenital single kidney     With chronic kidney disease  . Gout   . Mass of chest wall, right     Right chest wall mass 7.3 cm biopsy on 12/13/2013. Patient notes it was consistent with sarcoidosis but actual pathology results not available.  . Sickle cell trait [facility])    Social History   Social History  . Marital Status: Divorced    Spouse Name: N/A  . Number of Children: 2  . Years of Education: N/A   Social History Main Topics  . Smoking  status: Never Smoker   . Smokeless tobacco: Never Used  . Alcohol Use: No  . Drug Use: No  . Sexual Activity: Not on file   Other Topics Concern  . Not on file   Social History Narrative   Lives with son.  One son is deceased.     Allergies  Allergen Reactions  . Sulfur Rash   Immunization History  Administered Date(s) Administered  . Influenza,inj,Quad PF,36+ Mos 04/13/2014, 04/16/2015  . Pneumococcal Polysaccharide-23 04/13/2014  . Tdap 01/02/2015  . Zoster 05/21/2014   Review of Systems  Constitutional: Negative.  Negative for fever and fatigue.  HENT: Negative.   Eyes: Negative.  Negative for visual disturbance.  Respiratory: Negative for chest tightness and shortness of breath.   Cardiovascular: Negative.   Gastrointestinal: Negative.   Endocrine: Negative.  Negative for polydipsia, polyphagia and polyuria.  Genitourinary: Negative.   Musculoskeletal: Positive for myalgias.  Skin: Negative.   Allergic/Immunologic: Negative.   Neurological: Negative.  Negative for light-headedness and headaches.  Hematological: Negative.   Psychiatric/Behavioral: Negative.  Negative for suicidal ideas.       Objective:   Physical Exam  Constitutional: She is oriented to person, place, and time. Vital signs are normal. She appears well-developed and well-nourished. Nasal cannula in place.  HENT:  Head: Normocephalic and atraumatic.  Right Ear: Hearing, tympanic membrane, external ear and  ear canal normal.  Left Ear: Hearing, tympanic membrane, external ear and ear canal normal.  Mouth/Throat: Uvula is midline and oropharynx is clear and moist. Abnormal dentition. No dental caries.  Eyes: Conjunctivae and EOM are normal. Pupils are equal, round, and reactive to light. Lids are everted and swept, no foreign bodies found.  Neck: Trachea normal and normal range of motion. Neck supple.  Cardiovascular: Normal rate, regular rhythm, S1 normal, S2 normal, normal heart sounds and intact  distal pulses.   Bilateral lower extremity edema  Pulmonary/Chest: Effort normal and breath sounds normal.  Abdominal: Soft. Normal appearance and bowel sounds are normal.  Increased abdominal girth  Neurological: She is alert and oriented to person, place, and time. She has normal reflexes.  Skin: Skin is warm, dry and intact.  Hyperpigmentation to lower extremities  Psychiatric: She has a normal mood and affect. Her speech is normal and behavior is normal. Judgment and thought content normal. Cognition and memory are normal.      BP 141/72 mmHg  Pulse 67  Temp(Src) 97.9 F (36.6 C) (Oral)  Resp 18  Ht '5\' 6"'$  (1.676 m)  Wt 362 lb (164.202 kg)  BMI 58.46 kg/m2  SpO2 99% Assessment & Plan:   1. Essential hypertension Blood pressure is at goal on current medication regimen. The patient is asked to make an attempt to improve diet and exercise patterns to aid in medical management of this problem. - COMPLETE METABOLIC PANEL WITH GFR - POCT urinalysis dipstick  2. Solitary kidney Patient has a left solitary kidney referred patient to [facility] for further evaluation. She missed previous appointment due to transportation constraints. She has has an increased creatinine level. Will check creatinine level on today.    3. On home oxygen therapy Oxygen saturation is 100 % on 2 liters of oxygen. Will continue. She is to follow up with pulmonologist as scheduled.   4. Morbid obesity, unspecified obesity type ([place]) Recommend a lowfat, low carbohydrate diet divided over 5-6 small meals, increase water intake to 2-3 glasses per day       The patient was given clear instructions to go to ER or return to medical center if symptoms do not improve, worsen or new problems develop. The patient verbalized understanding. Will notify patient with laboratory results.  RTC: Follow up around January 02, 2016 for annual physical    Dorena Dew, FNP

## 2015-11-05 ENCOUNTER — Ambulatory Visit: Payer: Medicare Other | Admitting: Podiatry

## 2015-11-12 DIAGNOSIS — C3490 Malignant neoplasm of unspecified part of unspecified bronchus or lung: Secondary | ICD-10-CM | POA: Diagnosis not present

## 2015-11-12 DIAGNOSIS — N189 Chronic kidney disease, unspecified: Secondary | ICD-10-CM | POA: Diagnosis not present

## 2015-11-12 DIAGNOSIS — D869 Sarcoidosis, unspecified: Secondary | ICD-10-CM | POA: Diagnosis not present

## 2015-11-12 DIAGNOSIS — J45909 Unspecified asthma, uncomplicated: Secondary | ICD-10-CM | POA: Diagnosis not present

## 2015-11-12 DIAGNOSIS — D573 Sickle-cell trait: Secondary | ICD-10-CM | POA: Diagnosis not present

## 2015-11-12 DIAGNOSIS — E8881 Metabolic syndrome: Secondary | ICD-10-CM | POA: Diagnosis not present

## 2015-11-12 DIAGNOSIS — J449 Chronic obstructive pulmonary disease, unspecified: Secondary | ICD-10-CM | POA: Diagnosis not present

## 2015-11-12 DIAGNOSIS — N2581 Secondary hyperparathyroidism of renal origin: Secondary | ICD-10-CM | POA: Diagnosis not present

## 2015-11-12 DIAGNOSIS — D631 Anemia in chronic kidney disease: Secondary | ICD-10-CM | POA: Diagnosis not present

## 2015-11-12 DIAGNOSIS — M109 Gout, unspecified: Secondary | ICD-10-CM | POA: Diagnosis not present

## 2015-11-12 DIAGNOSIS — I129 Hypertensive chronic kidney disease with stage 1 through stage 4 chronic kidney disease, or unspecified chronic kidney disease: Secondary | ICD-10-CM | POA: Diagnosis not present

## 2015-11-12 DIAGNOSIS — R609 Edema, unspecified: Secondary | ICD-10-CM | POA: Diagnosis not present

## 2015-11-14 DIAGNOSIS — N189 Chronic kidney disease, unspecified: Secondary | ICD-10-CM | POA: Diagnosis not present

## 2015-11-15 ENCOUNTER — Other Ambulatory Visit: Payer: Self-pay | Admitting: Nephrology

## 2015-11-15 DIAGNOSIS — N189 Chronic kidney disease, unspecified: Secondary | ICD-10-CM

## 2015-11-18 ENCOUNTER — Other Ambulatory Visit: Payer: Self-pay | Admitting: Nephrology

## 2015-11-18 DIAGNOSIS — N189 Chronic kidney disease, unspecified: Secondary | ICD-10-CM

## 2015-11-21 ENCOUNTER — Other Ambulatory Visit: Payer: Medicare Other

## 2015-11-21 ENCOUNTER — Ambulatory Visit
Admission: RE | Admit: 2015-11-21 | Discharge: 2015-11-21 | Disposition: A | Discharge status: Home / Self Care | Payer: Medicare Other | Source: Ambulatory Visit | Attending: Nephrology | Admitting: Nephrology

## 2015-11-21 DIAGNOSIS — N189 Chronic kidney disease, unspecified: Secondary | ICD-10-CM

## 2015-11-28 ENCOUNTER — Encounter: Payer: Self-pay | Admitting: Hematology

## 2015-11-28 ENCOUNTER — Other Ambulatory Visit (HOSPITAL_BASED_OUTPATIENT_CLINIC_OR_DEPARTMENT_OTHER): Payer: Medicare Other

## 2015-11-28 ENCOUNTER — Ambulatory Visit (HOSPITAL_BASED_OUTPATIENT_CLINIC_OR_DEPARTMENT_OTHER): Payer: Medicare Other | Admitting: Hematology

## 2015-11-28 ENCOUNTER — Other Ambulatory Visit: Payer: Self-pay | Admitting: *Deleted

## 2015-11-28 ENCOUNTER — Telehealth: Payer: Self-pay | Admitting: Hematology

## 2015-11-28 VITALS — BP 167/68 | HR 79 | Temp 98.9°F | Resp 20 | Ht 66.0 in | Wt 358.9 lb

## 2015-11-28 DIAGNOSIS — R718 Other abnormality of red blood cells: Secondary | ICD-10-CM | POA: Diagnosis not present

## 2015-11-28 DIAGNOSIS — Z85118 Personal history of other malignant neoplasm of bronchus and lung: Secondary | ICD-10-CM

## 2015-11-28 LAB — COMPREHENSIVE METABOLIC PANEL
ALBUMIN: 3.3 g/dL — AB (ref 3.5–5.0)
ALK PHOS: 177 U/L — AB (ref 40–150)
ALT: 19 U/L (ref 0–55)
AST: 24 U/L (ref 5–34)
Anion Gap: 9 mEq/L (ref 3–11)
BUN: 33.6 mg/dL — AB (ref 7.0–26.0)
CALCIUM: 9 mg/dL (ref 8.4–10.4)
CO2: 34 mEq/L — ABNORMAL HIGH (ref 22–29)
CREATININE: 1.7 mg/dL — AB (ref 0.6–1.1)
Chloride: 100 mEq/L (ref 98–109)
EGFR: 38 mL/min/{1.73_m2} — ABNORMAL LOW (ref >90)
GLUCOSE: 106 mg/dL (ref 70–140)
Potassium: 3.8 mEq/L (ref 3.5–5.1)
SODIUM: 144 meq/L (ref 136–145)
Total Bilirubin: 0.42 mg/dL (ref 0.20–1.20)
Total Protein: 7 g/dL (ref 6.4–8.3)

## 2015-11-28 LAB — CBC WITH DIFFERENTIAL/PLATELET
BASO%: 0.2 % (ref 0.0–2.0)
Basophils Absolute: 0 10*3/uL (ref 0.0–0.1)
EOS ABS: 0.5 10*3/uL (ref 0.0–0.5)
EOS%: 8.5 % — AB (ref 0.0–7.0)
HEMATOCRIT: 35.5 % (ref 34.8–46.6)
HEMOGLOBIN: 11.3 g/dL — AB (ref 11.6–15.9)
LYMPH%: 24.5 % (ref 14.0–49.7)
MCH: 22.2 pg — ABNORMAL LOW (ref 25.1–34.0)
MCHC: 31.8 g/dL (ref 31.5–36.0)
MCV: 69.9 fL — ABNORMAL LOW (ref 79.5–101.0)
MONO#: 0.6 10*3/uL (ref 0.1–0.9)
MONO%: 9.5 % (ref 0.0–14.0)
NEUT%: 57.3 % (ref 38.4–76.8)
NEUTROS ABS: 3.5 10*3/uL (ref 1.5–6.5)
Platelets: 147 10*3/uL (ref 145–400)
RBC: 5.08 10*6/uL (ref 3.70–5.45)
RDW: 15 % — ABNORMAL HIGH (ref 11.2–14.5)
WBC: 6 10*3/uL (ref 3.9–10.3)
lymph#: 1.5 10*3/uL (ref 0.9–3.3)

## 2015-11-28 LAB — LACTATE DEHYDROGENASE: LDH: 117 U/L — AB (ref 125–245)

## 2015-11-28 NOTE — Telephone Encounter (Signed)
Gave and printed appt sched and avs fo rpt; for NOV  °

## 2015-11-28 NOTE — Progress Notes (Signed)
Pamela [name]    HEMATOLOGY/ONCOLOGY CLINIC NOTE  Date of Service: 11/28/2015   Patient Care Team: Leana Gamer, MD as PCP - General (Internal Medicine)  CHIEF COMPLAINTS/PURPOSE OF CONSULTATION:  Followup of lung cancer  DIAGNOSIS 1. Right upper lobe T1b, N0, M0 primary lung adenocarcinoma diagnosed in December 2012. She was deemed not to be a surgical candidate. Treated by radiation oncology with SBRT at [facility]. Completed treatment in March 2013 and has been monitored since with no evidence of recurrence based on PET/CT scan done on 05/20/2015.  2.  Right anterior chest wall slowly growing masslike lesion in the right pectoralis minor muscle. Unclear etiology. Biopsy was done on 12/14/2013 at the [name]. FNA showed skeletal muscles, macrophages and spindle cells some with cytologic atypia. Needle core biopsy showed soft tissue and skeletal muscle with extensive necrosis; rare spindle cells with cytologic atypia. Treatment effect causing reactive atypia cannot be ruled out. Clinical correlation is advised to ensure the sample is representative. PET/CT scan done here on 05/20/2015 shows no change in metabolic activity or size of the medial right chest wall mass suggesting benign etiology.  INTERVAL HISTORY  Pamela [name] is here for her scheduled 6 month follow-up.  She notes that she is doing well overall and has no new acute concerns.  No new bone pains.  Notes that her breathing has been well overall.  Notes that she had a repeat sleep study and was told that she did not need CPAP machine.  He has been following with Dr. Melvyn Novas for sarcoidosis monitoring and management. Notes that she has been given a referral by her primary care physician to see Dr. Deterdine for nephrology evaluation. Weight has been fairly stable.  She notes that she has been trying to be as physically active as possible.  MEDICAL HISTORY:  Past Medical History  Diagnosis Date  . CHF  (congestive heart failure) (HCC)     Preserved EF  . Asthma   . COPD (chronic obstructive pulmonary disease) ([place])   . Arthritis   . Sarcoidosis ([place])   . Cancer ([place])     lung, adenocarcinoma right lung 2012  . Hypertension   . Sleep apnea   . Oxygen deficiency   . Lung cancer [facility]) 2012    Right upper lobe lung adenocarcinoma diagnosed with needle biopsy treated by SBRT finished treatment April 2013 has been monitored since  . Congenital single kidney     With chronic kidney disease  . Gout   . Mass of chest wall, right     Right chest wall mass 7.3 cm biopsy on 12/13/2013. Patient notes it was consistent with sarcoidosis but actual pathology results not available.  . Sickle cell trait ([place])    CKD ([place] 11/21/2015 showed no urinary tract abnormalities.) Gout on Uloric - tea as a trigger.  SURGICAL HISTORY: Past Surgical History  Procedure Laterality Date  . Lung biopsy    . Tubal ligation    . Vein ligation and stripping      SOCIAL HISTORY: Social History   Social History  . Marital Status: Divorced    Spouse Name: N/A  . Number of Children: 2  . Years of Education: N/A   Occupational History  . Not on file.   Social History Main Topics  . Smoking status: Never Smoker   . Smokeless tobacco: Never Used  . Alcohol Use: No  . Drug Use: No  . Sexual Activity: Not on file   Other  Topics Concern  . Not on file   Social History Narrative   Lives with son.  One son is deceased.      FAMILY HISTORY: Family History  Problem Relation Age of Onset  . Hypertension Mother   . Renal Disease Mother   . Cancer Father   . Heart disease Father     No details  . Cancer Brother   . Diabetes Brother   . Cervical cancer Sister   . Diabetes Sister   . Multiple myeloma Sister     ALLERGIES:  is allergic to sulfur.  MEDICATIONS:  Current Outpatient Prescriptions  Medication Sig Dispense Refill  . acetaminophen (TYLENOL) 500 MG tablet Take 1 tablet (500 mg total) by  mouth every 6 (six) hours as needed. 30 tablet 2  . albuterol (PROAIR HFA) 108 (90 BASE) MCG/ACT inhaler Inhale 2 puffs into the lungs every 6 (six) hours as needed for wheezing or shortness of breath. 1 Inhaler 1  . albuterol (PROVENTIL) (2.5 MG/3ML) 0.083% nebulizer solution Take 3 mLs (2.5 mg total) by nebulization every 6 (six) hours as needed for wheezing or shortness of breath. 75 mL 12  . aspirin 81 MG tablet Take 81 mg by mouth daily.    . budesonide-formoterol (SYMBICORT) 80-4.5 MCG/ACT inhaler Inhale 2 puffs into the lungs 2 (two) times daily. 1 Inhaler 5  . Calcium Carbonate-Vitamin D (CALTRATE 600+D PO) Take 1 tablet by mouth daily.     Pamela [name] docusate sodium (COLACE) 100 MG capsule Take 100 mg by mouth 2 (two) times daily.    . febuxostat (ULORIC) 40 MG tablet Take 1 tablet (40 mg total) by mouth daily. 90 tablet 1  . ferrous sulfate 325 (65 FE) MG tablet Take 1 tablet (325 mg total) by mouth daily with breakfast. 90 tablet 0  . furosemide (LASIX) 20 MG tablet Take 1 tablet (20 mg total) by mouth 2 (two) times daily. (Patient taking differently: Take 40 mg by mouth 2 (two) times daily. ) 180 tablet 0  . predniSONE (DELTASONE) 10 MG tablet Take 1 tablet (10 mg total) by mouth daily with breakfast. 30 tablet 2   No current facility-administered medications for this visit.    REVIEW OF SYSTEMS:    10 Point review of Systems was done is negative except as noted above.  PHYSICAL EXAMINATION: ECOG PERFORMANCE STATUS: 2-3  . Filed Vitals:   11/28/15 1006  BP: 167/68  Pulse: 79  Temp: 98.9 F (37.2 C)  Resp: 20   Filed Weights   11/28/15 1006  Weight: 358 lb 14.4 oz (162.796 kg)   .Body mass index is 57.96 kg/(m^2).  GENERAL:alert,  morbidly obese female in no acute distress and comfortable SKIN: skin color, texture, turgor are normal, no rashes or significant lesions EYES: normal, conjunctiva are pink and non-injected, sclera clear OROPHARYNX:no exudate, no erythema and lips,  buccal mucosa, and tongue normal  NECK: supple, no JVD, thyroid normal size, non-tender, without nodularity LYMPH:  no palpable lymphadenopathy in the cervical, axillary or inguinal LUNG: Distant breath sounds, a few basal rales, no rhonchi. HEART: regular rate & rhythm,  no murmurs and no lower extremity edema ABDOMEN: abdomen soft, non-tender, normoactive bowel sounds  Musculoskeletal: no cyanosis of digits and no clubbing  PSYCH: alert & oriented x 3 with fluent speech NEURO: no focal motor/sensory deficits  LABORATORY DATA:  I have reviewed the data as listed  . CBC Latest Ref Rng 11/28/2015 05/09/2015 10/03/2014  WBC 3.9 - 10.3 10e3/uL 6.0  6.3 7.1  Hemoglobin 11.6 - 15.9 g/dL 11.3(L) 11.0(L) 9.9(L)  Hematocrit 34.8 - 46.6 % 35.5 35.1 32.1(L)  Platelets 145 - 400 10e3/uL 147 149 168   . CBC    Component Value Date/Time   WBC 6.0 11/28/2015 0954   WBC 7.1 10/03/2014 1628   RBC 5.08 11/28/2015 0954   RBC 4.56 10/03/2014 1628   RBC 4.01 05/25/2014 0000   HGB 11.3* 11/28/2015 0954   HGB 9.9* 10/03/2014 1628   HCT 35.5 11/28/2015 0954   HCT 32.1* 10/03/2014 1628   PLT 147 11/28/2015 0954   PLT 168 10/03/2014 1628   MCV 69.9* 11/28/2015 0954   MCV 70.4* 10/03/2014 1628   MCH 22.2* 11/28/2015 0954   MCH 21.7* 10/03/2014 1628   MCHC 31.8 11/28/2015 0954   MCHC 30.8 10/03/2014 1628   RDW 15.0* 11/28/2015 0954   RDW 16.0* 10/03/2014 1628   LYMPHSABS 1.5 11/28/2015 0954   LYMPHSABS 0.6* 10/03/2014 1628   MONOABS 0.6 11/28/2015 0954   MONOABS 0.3 10/03/2014 1628   EOSABS 0.5 11/28/2015 0954   EOSABS 0.1 10/03/2014 1628   BASOSABS 0.0 11/28/2015 0954   BASOSABS 0.0 10/03/2014 1628     . CMP Latest Ref Rng 11/28/2015 10/08/2015 07/16/2015  Glucose 70 - 140 mg/dl 106 93 86  BUN 7.0 - 26.0 mg/dL 33.6(H) 29(H) 29(H)  Creatinine 0.6 - 1.1 mg/dL 1.7(H) 1.58(H) 1.44(H)  Sodium 136 - 145 mEq/L 144 139 138  Potassium 3.5 - 5.1 mEq/L 3.8 4.3 3.7  Chloride 98 - 110 mmol/L - 103  100  CO2 22 - 29 mEq/L 34(H) 29 31  Calcium 8.4 - 10.4 mg/dL 9.0 8.2(L) 8.9  Total Protein 6.4 - 8.3 g/dL 7.0 6.2 6.3  Total Bilirubin 0.20 - 1.20 mg/dL 0.42 0.3 0.5  Alkaline Phos 40 - 150 U/L 177(H) 183(H) 203(H)  AST 5 - 34 U/L 24 27 43(H)  ALT 0 - 55 U/L 19 24 40(H)   . Lab Results  Component Value Date   LDH 117* 11/28/2015     RADIOGRAPHIC STUDIES: I have personally reviewed the radiological images as listed and agreed with the findings in the report. US Renal  11/21/2015  CLINICAL DATA:  Chronic renal insufficiency, hypertension, pre diabetes. EXAM: RENAL / URINARY TRACT ULTRASOUND COMPLETE COMPARISON:  Renal ultrasound of May 24, 2014 FINDINGS: Right Kidney: Length: 9.6 cm. Echogenicity within normal limits. No mass or hydronephrosis visualized. Left Kidney: Length: 10.0 cm. Echogenicity within normal limits. No mass or hydronephrosis visualized. Bladder: Appears normal for degree of bladder distention. IMPRESSION: No acute urinary tract abnormality is observed. Both kidneys have decreased slightly in length since the previous study of November 2015. Electronically Signed   By: David  Martinique M.D.   On: 11/21/2015 12:57   ASSESSMENT & PLAN:   65 year old African-American female with   #1 history of right upper lobe T1b, N0, M0 primary lung adenocarcinoma most in December 2012. She was deemed not to be a surgical candidate. Treated by radiation oncology with SBRT at [facility]. Completed treatment in March 2013 and has been monitored since with no evidence of recurrence. no evidence of recurrence based on PET/CT scan done on 05/20/2015. On clinical visit today the patient has no new change in her breathing and no new focal symptoms.  Hemoglobin is stable.  LDH within normal limits. Plan -No overt clinical symptoms suggestive of recurrent lung cancer. -We will plan to repeat a CT scan of the chest without contrast in 6 months  with repeat labs.  #2 Right anterior  chest wall slowly growing masslike lesion in the right pectoralis minor muscle. Unclear etiology. FNA showed skeletal muscles, macrophages and spindle cells some with cytologic atypia. Needle core biopsy showed soft tissue and skeletal muscle with extensive necrosis; rare spindle cells with cytologic atypia. Treatment effect causing reactive atypia cannot be ruled out. Clinical correlation is advised to ensure the sample is representative. PET/CT scan done here on 05/20/2015 shows no change in metabolic activity or size of the medial right chest wall mass suggesting benign etiology. Could certainly be related to her sarcoidosis . Previously discussed with the patient the option of biopsying the mass for a more definitive diagnosis versus monitoring it. She chooses to monitor it at this time given that imaging findings suggest a likely benign etiology that hasnt changed over the last 18 months.  #3 history of extensive sarcoidosis involving lymphadenopathy in the chest, abdomen as well as spleen and possibly liver .previously treated with steroids by Dr. Bartholome Bill, pulmonary at [facility] .has recently established care with Dr. Melvyn Novas for her pulmonary and sarcoidosis management.   Plan -Continue followup with Dr.[name] For further management  #5 history of sickle cell trait. Hemoglobin electrophoresis is not available. Hgb Improved from 9.9 to 11.3. Microcytosis likely from chronic disease from her sarcoidosis. R/o Iron deficiency Plan  -Continue on ferrous sulfate 1 tablet daily . -Repeat ferritin level and hemoglobin electrophoresis with primary care physician on next visit on 01/02/2016 -Adjust oral iron dose based on ferritin levels. -Would recommend treating to ferritin levels of more than 50 and preferably closer to 100.  Return to care in 6 months with Dr. Irene Limbo for continued follow-up .earlier if any acute new concerns .  All of the patients questions were answered  to her apparent  satisfaction. The patient knows to call the clinic with any problems, questions or concerns.  I spent 20 minutes counseling the patient face to face. The total time spent in the appointment was 25 minutes and more than 50% was on counseling and direct patient cares.    Sullivan Lone MD [place] AAHIVMS [facility] Hematology/Oncology Physician [facility]  (Office):       [phone] (Work cell):  [phone] (Fax):           [phone]

## 2015-12-02 ENCOUNTER — Telehealth: Payer: Self-pay

## 2015-12-02 DIAGNOSIS — M109 Gout, unspecified: Secondary | ICD-10-CM

## 2015-12-02 LAB — HEMOGLOBINOPATHY EVALUATION
HGB A: 69.3 % — AB (ref 94.0–98.0)
HGB C: 0 %
HGB S: 26.8 % — AB
Hemoglobin A2 Quantitation: 3.9 % — ABNORMAL HIGH (ref 0.7–3.1)
Hemoglobin F Quantitation: 0 % (ref 0.0–2.0)

## 2015-12-02 MED ORDER — FEBUXOSTAT 40 MG PO TABS: 40.0000 mg | ORAL_TABLET | Freq: Every day | ORAL | Status: DC

## 2015-12-02 NOTE — Telephone Encounter (Signed)
Pt called requesting a medication refill for Uloric, '40mg'$ . Thanks!

## 2015-12-02 NOTE — Telephone Encounter (Signed)
Refill for uloric sent into pharmacy. Thanks!

## 2015-12-06 DIAGNOSIS — R609 Edema, unspecified: Secondary | ICD-10-CM | POA: Diagnosis not present

## 2015-12-06 DIAGNOSIS — D631 Anemia in chronic kidney disease: Secondary | ICD-10-CM | POA: Diagnosis not present

## 2015-12-06 DIAGNOSIS — J45909 Unspecified asthma, uncomplicated: Secondary | ICD-10-CM | POA: Diagnosis not present

## 2015-12-06 DIAGNOSIS — J449 Chronic obstructive pulmonary disease, unspecified: Secondary | ICD-10-CM | POA: Diagnosis not present

## 2015-12-06 DIAGNOSIS — D869 Sarcoidosis, unspecified: Secondary | ICD-10-CM | POA: Diagnosis not present

## 2015-12-06 DIAGNOSIS — N2581 Secondary hyperparathyroidism of renal origin: Secondary | ICD-10-CM | POA: Diagnosis not present

## 2015-12-06 DIAGNOSIS — N189 Chronic kidney disease, unspecified: Secondary | ICD-10-CM | POA: Diagnosis not present

## 2015-12-06 DIAGNOSIS — D573 Sickle-cell trait: Secondary | ICD-10-CM | POA: Diagnosis not present

## 2015-12-06 DIAGNOSIS — E8881 Metabolic syndrome: Secondary | ICD-10-CM | POA: Diagnosis not present

## 2015-12-06 DIAGNOSIS — M109 Gout, unspecified: Secondary | ICD-10-CM | POA: Diagnosis not present

## 2015-12-06 DIAGNOSIS — I129 Hypertensive chronic kidney disease with stage 1 through stage 4 chronic kidney disease, or unspecified chronic kidney disease: Secondary | ICD-10-CM | POA: Diagnosis not present

## 2015-12-06 DIAGNOSIS — C3490 Malignant neoplasm of unspecified part of unspecified bronchus or lung: Secondary | ICD-10-CM | POA: Diagnosis not present

## 2015-12-20 ENCOUNTER — Telehealth: Payer: Self-pay

## 2015-12-20 DIAGNOSIS — I5032 Chronic diastolic (congestive) heart failure: Secondary | ICD-10-CM

## 2015-12-20 MED ORDER — FUROSEMIDE 20 MG PO TABS: 20.0000 mg | ORAL_TABLET | Freq: Two times a day (BID) | ORAL | Status: DC

## 2015-12-20 NOTE — Telephone Encounter (Signed)
Refill for furosemide sent into pharmacy. Thanks!

## 2015-12-20 NOTE — Telephone Encounter (Signed)
Pt is requesting a medication refill on her Furosemide. Thanks!

## 2016-01-02 ENCOUNTER — Encounter: Payer: Self-pay | Admitting: Family Medicine

## 2016-01-02 ENCOUNTER — Ambulatory Visit (INDEPENDENT_AMBULATORY_CARE_PROVIDER_SITE_OTHER): Payer: Medicare Other | Admitting: Family Medicine

## 2016-01-02 ENCOUNTER — Other Ambulatory Visit: Payer: Self-pay | Admitting: Family Medicine

## 2016-01-02 VITALS — BP 132/63 | HR 73 | Temp 98.4°F | Resp 16 | Ht 66.0 in | Wt 352.0 lb

## 2016-01-02 DIAGNOSIS — K5900 Constipation, unspecified: Secondary | ICD-10-CM

## 2016-01-02 DIAGNOSIS — I5022 Chronic systolic (congestive) heart failure: Secondary | ICD-10-CM

## 2016-01-02 DIAGNOSIS — Q600 Renal agenesis, unilateral: Secondary | ICD-10-CM

## 2016-01-02 DIAGNOSIS — E559 Vitamin D deficiency, unspecified: Secondary | ICD-10-CM

## 2016-01-02 DIAGNOSIS — I10 Essential (primary) hypertension: Secondary | ICD-10-CM

## 2016-01-02 DIAGNOSIS — IMO0002 Reserved for concepts with insufficient information to code with codable children: Secondary | ICD-10-CM

## 2016-01-02 DIAGNOSIS — M109 Gout, unspecified: Secondary | ICD-10-CM

## 2016-01-02 DIAGNOSIS — M1009 Idiopathic gout, multiple sites: Secondary | ICD-10-CM

## 2016-01-02 DIAGNOSIS — R7303 Prediabetes: Secondary | ICD-10-CM

## 2016-01-02 DIAGNOSIS — I5032 Chronic diastolic (congestive) heart failure: Secondary | ICD-10-CM

## 2016-01-02 DIAGNOSIS — Z9981 Dependence on supplemental oxygen: Secondary | ICD-10-CM

## 2016-01-02 DIAGNOSIS — D509 Iron deficiency anemia, unspecified: Secondary | ICD-10-CM

## 2016-01-02 DIAGNOSIS — D869 Sarcoidosis, unspecified: Secondary | ICD-10-CM

## 2016-01-02 LAB — POCT URINALYSIS DIP (DEVICE)
Bilirubin Urine: NEGATIVE
Glucose, UA: NEGATIVE mg/dL
HGB URINE DIPSTICK: NEGATIVE
Ketones, ur: NEGATIVE mg/dL
Leukocytes, UA: NEGATIVE
Nitrite: NEGATIVE
PH: 6 (ref 5.0–8.0)
PROTEIN: NEGATIVE mg/dL
Specific Gravity, Urine: 1.01 (ref 1.005–1.030)
Urobilinogen, UA: 0.2 mg/dL (ref 0.0–1.0)

## 2016-01-02 MED ORDER — FEBUXOSTAT 40 MG PO TABS: 40.0000 mg | ORAL_TABLET | Freq: Every day | ORAL | Status: DC

## 2016-01-02 MED ORDER — DOCUSATE SODIUM 100 MG PO CAPS: 100.0000 mg | ORAL_CAPSULE | Freq: Every day | ORAL | Status: DC

## 2016-01-02 MED ORDER — FERROUS SULFATE 325 (65 FE) MG PO TABS: 325.0000 mg | ORAL_TABLET | Freq: Every day | ORAL | Status: DC

## 2016-01-02 MED ORDER — BUDESONIDE-FORMOTEROL FUMARATE 80-4.5 MCG/ACT IN AERO: 2.0000 | INHALATION_SPRAY | Freq: Two times a day (BID) | RESPIRATORY_TRACT | Status: DC

## 2016-01-02 MED ORDER — ASPIRIN 81 MG PO TABS: 81.0000 mg | ORAL_TABLET | Freq: Every day | ORAL | Status: DC

## 2016-01-02 MED ORDER — PREDNISONE 10 MG PO TABS: 10.0000 mg | ORAL_TABLET | Freq: Every day | ORAL | Status: DC

## 2016-01-02 MED ORDER — CALCIUM CARBONATE-VITAMIN D 600-400 MG-UNIT PO TABS: 1.0000 | ORAL_TABLET | Freq: Every day | ORAL | Status: DC

## 2016-01-02 MED ORDER — FUROSEMIDE 40 MG PO TABS: 40.0000 mg | ORAL_TABLET | Freq: Two times a day (BID) | ORAL | Status: DC

## 2016-01-02 NOTE — Progress Notes (Signed)
Subjective:    Patient ID: Pamela [name], female    DOB: Dec 23, 1950, 65 y.o.   MRN: [id number]  HPI  Pamela [name] is a 65 year old female with history of chronic diastolic CHF, gouty arthritis, HTN, ,and sarcodosis that presents for a 3 month follow up of hypertension.  She states that she feels well with minimal complaints. She states that she has been following a low sodium diet and has increased her daily activity. She has been taking all medications consistently. She has recently followed up with Dr. Melvyn Novas, pulmonologist for sarcoidosis and history of asthma. She was also evaluated by Dr. Irene Limbo, oncology, she maintains that her follow up is in 3 months. Patient has no headache, chest pain, abdominal pain, dysuria, rectal bleeding, nausea, vomiting, diarrhea, or constipation. No new weakness tingling or numbness. She also denies cough, or shortness of breath.   Pamela [name] has severe obesity. She has been following a low sodium, carbohydrate modified, low calorie diet consistently over the past several months. She is unable to exercise due to history of sarcoidosis and chronic oxygen use. She has decreased weight by 6 pounds since previous appointment. She is inquiring about bariatric procedures due to inability to lose weight with continuous interventions.    Past Medical History  Diagnosis Date  . CHF (congestive heart failure) (HCC)     Preserved EF  . Asthma   . COPD (chronic obstructive pulmonary disease) ([place])   . Arthritis   . Sarcoidosis ([place])   . Cancer ([place])     lung, adenocarcinoma right lung 2012  . Hypertension   . Sleep apnea   . Oxygen deficiency   . Lung cancer [facility]) 2012    Right upper lobe lung adenocarcinoma diagnosed with needle biopsy treated by SBRT finished treatment April 2013 has been monitored since  . Congenital single kidney     With chronic kidney disease  . Gout   . Mass of chest wall, right     Right chest wall mass 7.3 cm biopsy on 12/13/2013.  Patient notes it was consistent with sarcoidosis but actual pathology results not available.  . Sickle cell trait [facility])    Social History   Social History  . Marital Status: Divorced    Spouse Name: N/A  . Number of Children: 2  . Years of Education: N/A   Social History Main Topics  . Smoking status: Never Smoker   . Smokeless tobacco: Never Used  . Alcohol Use: No  . Drug Use: No  . Sexual Activity: Not on file   Other Topics Concern  . Not on file   Social History Narrative   Lives with son.  One son is deceased.     Allergies  Allergen Reactions  . Sulfur Rash   Immunization History  Administered Date(s) Administered  . Influenza,inj,Quad PF,36+ Mos 04/13/2014, 04/16/2015  . Pneumococcal Polysaccharide-23 04/13/2014  . Tdap 01/02/2015  . Zoster 05/21/2014   Review of Systems  Constitutional: Negative.  Negative for fever and fatigue.  HENT: Negative.   Eyes: Negative.  Negative for visual disturbance.  Respiratory: Negative for chest tightness and shortness of breath.   Cardiovascular: Negative.   Gastrointestinal: Negative.   Endocrine: Negative.  Negative for polydipsia, polyphagia and polyuria.  Genitourinary: Negative.   Musculoskeletal: Positive for myalgias.  Skin: Negative.   Allergic/Immunologic: Negative.   Neurological: Negative.  Negative for light-headedness and headaches.  Hematological: Negative.   Psychiatric/Behavioral: Negative.  Negative for suicidal ideas.  Objective:   Physical Exam  Constitutional: She is oriented to person, place, and time. Vital signs are normal. She appears well-developed and well-nourished. Nasal cannula in place.  Morbid obesity  HENT:  Head: Normocephalic and atraumatic.  Right Ear: Hearing, tympanic membrane, external ear and ear canal normal.  Left Ear: Hearing, tympanic membrane, external ear and ear canal normal.  Mouth/Throat: Uvula is midline and oropharynx is clear and moist. She has dentures.   Eyes: Conjunctivae and EOM are normal. Pupils are equal, round, and reactive to light. Lids are everted and swept, no foreign bodies found.  Neck: Trachea normal and normal range of motion. Neck supple.  Cardiovascular: Normal rate, regular rhythm, S1 normal, S2 normal, normal heart sounds and intact distal pulses.   Bilateral lower extremity edema  Pulmonary/Chest: Effort normal and breath sounds normal.  Abdominal: Soft. Normal appearance and bowel sounds are normal.  Increased abdominal girth  Neurological: She is alert and oriented to person, place, and time. She has normal reflexes.  Skin: Skin is warm, dry and intact.  Hyperpigmentation to lower extremities  Psychiatric: She has a normal mood and affect. Her speech is normal and behavior is normal. Judgment and thought content normal. Cognition and memory are normal.      BP 132/63 mmHg  Pulse 73  Temp(Src) 98.4 F (36.9 C) (Oral)  Resp 16  Ht '5\' 6"'$  (1.676 m)  Wt 352 lb (159.666 kg)  BMI 56.84 kg/m2  SpO2 97% Assessment & Plan:  1. Essential hypertension Blood pressure is at goal on current medication regimen. Will check potassium level due to continuous use of Furosemide.  - aspirin 81 MG tablet; Take 1 tablet (81 mg total) by mouth daily.  Dispense: 90 tablet; Refill: 5 - Urinalysis Dipstick - BASIC METABOLIC PANEL WITH GFR   2. Morbid obesity, unspecified obesity type ([place]) Recommend a lowfat, low carbohydrate diet divided over 5-6 small meals, increase water intake to 3-4 glasses, and increase daily activity level.   - Consult to bariatric RN - Ambulatory referral to General Surgery  5. Prediabetes Previous a1C is 5.9, continue carbohydrate modified diet. Patient does not require medication intervention at this time.   6. On home oxygen therapy Continue Oxygen 2 L via n/c as previously prescribed   7. Chronic diastolic CHF (congestive heart failure) (HCC)  - furosemide (LASIX) 40 MG tablet; Take 1 tablet (40 mg  total) by mouth 2 (two) times daily.  Dispense: 180 tablet; Refill: 5  8. Anemia, iron deficiency  - ferrous sulfate 325 (65 FE) MG tablet; Take 1 tablet (325 mg total) by mouth daily with breakfast.  Dispense: 90 tablet; Refill: 0  9. Gouty arthritis  - febuxostat (ULORIC) 40 MG tablet; Take 1 tablet (40 mg total) by mouth daily.  Dispense: 90 tablet; Refill: 5  10. Vitamin D deficiency  - Calcium Carbonate-Vitamin D (CALTRATE 600+D) 600-400 MG-UNIT tablet; Take 1 tablet by mouth daily.  Dispense: 90 tablet; Refill: 5  11. Constipation, unspecified constipation type  - docusate sodium (COLACE) 100 MG capsule; Take 1 capsule (100 mg total) by mouth daily.  Dispense: 30 capsule; Refill: 0  12. Sarcoidosis ([place])  - budesonide-formoterol (SYMBICORT) 80-4.5 MCG/ACT inhaler; Inhale 2 puffs into the lungs 2 (two) times daily.  Dispense: 1 Inhaler; Refill: 5 - predniSONE (DELTASONE) 10 MG tablet; Take 1 tablet (10 mg total) by mouth daily with breakfast.  Dispense: 30 tablet; Refill: 5    RTC: Follow up in 3 months for Medicare Wellness  Visit     The patient was given clear instructions to go to ER or return to medical center if symptoms do not improve, worsen or new problems develop. The patient verbalized understanding. Will notify patient with laboratory results.     Dorena Dew, FNP

## 2016-01-03 ENCOUNTER — Other Ambulatory Visit: Payer: Self-pay | Admitting: Family Medicine

## 2016-01-03 ENCOUNTER — Telehealth: Payer: Self-pay

## 2016-01-03 DIAGNOSIS — E876 Hypokalemia: Secondary | ICD-10-CM

## 2016-01-03 LAB — BASIC METABOLIC PANEL WITH GFR
BUN: 32 mg/dL — AB (ref 7–25)
CO2: 30 mmol/L (ref 20–31)
CREATININE: 1.5 mg/dL — AB (ref 0.50–0.99)
Calcium: 9.4 mg/dL (ref 8.6–10.4)
Chloride: 96 mmol/L — ABNORMAL LOW (ref 98–110)
GFR, EST AFRICAN AMERICAN: 42 mL/min — AB (ref >=60)
GFR, Est Non African American: 37 mL/min — ABNORMAL LOW (ref >=60)
Glucose, Bld: 87 mg/dL (ref 65–99)
Potassium: 3.4 mmol/L — ABNORMAL LOW (ref 3.5–5.3)
Sodium: 140 mmol/L (ref 135–146)

## 2016-01-03 MED ORDER — POTASSIUM CHLORIDE ER 10 MEQ PO TBCR: 10.0000 meq | EXTENDED_RELEASE_TABLET | Freq: Every day | ORAL | Status: DC

## 2016-01-03 NOTE — Telephone Encounter (Signed)
Called and informed patient of labs and the need to start potassium as directed. Patient verbalized understanding. Thanks!

## 2016-01-03 NOTE — Telephone Encounter (Signed)
-----   Message from Dorena Dew, [place] sent at 01/03/2016  7:45 AM EDT ----- Regarding: lab results Please inform [name] that potassium level is mildly decreased since increasing Lasix. I will add daily potassium to medication regimen. Medication has been sent to pharmacy.   Thanks ----- Message -----    From: Lab in [facility] [place] [facility] Interface    Sent: 01/03/2016   4:06 AM      To: Dorena Dew, FNP

## 2016-02-11 DIAGNOSIS — H35031 Hypertensive retinopathy, right eye: Secondary | ICD-10-CM | POA: Diagnosis not present

## 2016-02-11 DIAGNOSIS — H2513 Age-related nuclear cataract, bilateral: Secondary | ICD-10-CM | POA: Diagnosis not present

## 2016-02-11 DIAGNOSIS — H35032 Hypertensive retinopathy, left eye: Secondary | ICD-10-CM | POA: Diagnosis not present

## 2016-02-11 DIAGNOSIS — H25013 Cortical age-related cataract, bilateral: Secondary | ICD-10-CM | POA: Diagnosis not present

## 2016-02-21 ENCOUNTER — Other Ambulatory Visit (INDEPENDENT_AMBULATORY_CARE_PROVIDER_SITE_OTHER): Payer: Medicare Other

## 2016-02-21 DIAGNOSIS — I10 Essential (primary) hypertension: Secondary | ICD-10-CM | POA: Diagnosis not present

## 2016-02-21 LAB — BASIC METABOLIC PANEL WITH GFR
BUN: 29 mg/dL — ABNORMAL HIGH (ref 7–25)
CHLORIDE: 101 mmol/L (ref 98–110)
CO2: 29 mmol/L (ref 20–31)
Calcium: 8.6 mg/dL (ref 8.6–10.4)
Creat: 1.58 mg/dL — ABNORMAL HIGH (ref 0.50–0.99)
GFR, Est African American: 40 mL/min — ABNORMAL LOW (ref >=60)
GFR, Est Non African American: 35 mL/min — ABNORMAL LOW (ref >=60)
Glucose, Bld: 80 mg/dL (ref 65–99)
POTASSIUM: 3.8 mmol/L (ref 3.5–5.3)
SODIUM: 141 mmol/L (ref 135–146)

## 2016-03-17 DIAGNOSIS — D573 Sickle-cell trait: Secondary | ICD-10-CM | POA: Diagnosis not present

## 2016-03-17 DIAGNOSIS — J45909 Unspecified asthma, uncomplicated: Secondary | ICD-10-CM | POA: Diagnosis not present

## 2016-03-17 DIAGNOSIS — D869 Sarcoidosis, unspecified: Secondary | ICD-10-CM | POA: Diagnosis not present

## 2016-03-17 DIAGNOSIS — I129 Hypertensive chronic kidney disease with stage 1 through stage 4 chronic kidney disease, or unspecified chronic kidney disease: Secondary | ICD-10-CM | POA: Diagnosis not present

## 2016-03-17 DIAGNOSIS — N2581 Secondary hyperparathyroidism of renal origin: Secondary | ICD-10-CM | POA: Diagnosis not present

## 2016-03-17 DIAGNOSIS — N189 Chronic kidney disease, unspecified: Secondary | ICD-10-CM | POA: Diagnosis not present

## 2016-03-17 DIAGNOSIS — M109 Gout, unspecified: Secondary | ICD-10-CM | POA: Diagnosis not present

## 2016-03-17 DIAGNOSIS — E8881 Metabolic syndrome: Secondary | ICD-10-CM | POA: Diagnosis not present

## 2016-03-17 DIAGNOSIS — J449 Chronic obstructive pulmonary disease, unspecified: Secondary | ICD-10-CM | POA: Diagnosis not present

## 2016-03-17 DIAGNOSIS — R609 Edema, unspecified: Secondary | ICD-10-CM | POA: Diagnosis not present

## 2016-03-17 DIAGNOSIS — D631 Anemia in chronic kidney disease: Secondary | ICD-10-CM | POA: Diagnosis not present

## 2016-03-17 DIAGNOSIS — C3490 Malignant neoplasm of unspecified part of unspecified bronchus or lung: Secondary | ICD-10-CM | POA: Diagnosis not present

## 2016-03-19 DIAGNOSIS — N390 Urinary tract infection, site not specified: Secondary | ICD-10-CM | POA: Diagnosis not present

## 2016-04-03 ENCOUNTER — Encounter: Payer: Self-pay | Admitting: Family Medicine

## 2016-04-03 ENCOUNTER — Ambulatory Visit (INDEPENDENT_AMBULATORY_CARE_PROVIDER_SITE_OTHER): Payer: Medicare Other | Admitting: Family Medicine

## 2016-04-03 VITALS — BP 135/63 | HR 67 | Temp 98.1°F | Resp 18 | Ht 66.0 in | Wt 379.0 lb

## 2016-04-03 DIAGNOSIS — D649 Anemia, unspecified: Secondary | ICD-10-CM | POA: Diagnosis not present

## 2016-04-03 DIAGNOSIS — Z13220 Encounter for screening for lipoid disorders: Secondary | ICD-10-CM

## 2016-04-03 DIAGNOSIS — Z1159 Encounter for screening for other viral diseases: Secondary | ICD-10-CM | POA: Diagnosis not present

## 2016-04-03 DIAGNOSIS — L600 Ingrowing nail: Secondary | ICD-10-CM

## 2016-04-03 DIAGNOSIS — Z23 Encounter for immunization: Secondary | ICD-10-CM | POA: Diagnosis not present

## 2016-04-03 DIAGNOSIS — Z131 Encounter for screening for diabetes mellitus: Secondary | ICD-10-CM

## 2016-04-03 LAB — CBC WITH DIFFERENTIAL/PLATELET
BASOS PCT: 0 %
Basophils Absolute: 0 cells/uL (ref 0–200)
EOS PCT: 16 %
Eosinophils Absolute: 1136 cells/uL — ABNORMAL HIGH (ref 15–500)
HCT: 37.3 % (ref 35.0–45.0)
HEMOGLOBIN: 11.4 g/dL — AB (ref 11.7–15.5)
LYMPHS ABS: 1562 {cells}/uL (ref 850–3900)
Lymphocytes Relative: 22 %
MCH: 21.9 pg — ABNORMAL LOW (ref 27.0–33.0)
MCHC: 30.6 g/dL — ABNORMAL LOW (ref 32.0–36.0)
MCV: 71.6 fL — ABNORMAL LOW (ref 80.0–100.0)
Monocytes Absolute: 568 cells/uL (ref 200–950)
Monocytes Relative: 8 %
NEUTROS ABS: 3834 {cells}/uL (ref 1500–7800)
Neutrophils Relative %: 54 %
Platelets: 174 10*3/uL (ref 140–400)
RBC: 5.21 MIL/uL — AB (ref 3.80–5.10)
RDW: 15.4 % — ABNORMAL HIGH (ref 11.0–15.0)
WBC: 7.1 10*3/uL (ref 3.8–10.8)

## 2016-04-03 MED ORDER — PNEUMOCOCCAL 13-VAL CONJ VACC IM SUSP: 0.5000 mL | INTRAMUSCULAR | Status: DC

## 2016-04-03 MED ORDER — AZITHROMYCIN 250 MG PO TABS: ORAL_TABLET | ORAL | 0 refills | Status: DC

## 2016-04-03 MED ORDER — PNEUMOCOCCAL 13-VAL CONJ VACC IM SUSP
0.5000 mL | INTRAMUSCULAR | Status: AC | PRN
  Administered 2016-04-03: 0.5 mL via INTRAMUSCULAR

## 2016-04-03 NOTE — Progress Notes (Signed)
Pamela [name], is a 65 y.o. female  PZW:[id number]  [id number]  DOB - 1950/07/24  CC:  Chief Complaint  Patient presents with  . Follow-up       HPI: Pamela [name] is a 65 y.o. female here for follow-up chronic conditions. She has a history of asthma, CHF, lung cancer (followed by oncology), COPD (followed by pulmonology, gout, CKD.Hypertension. Sarcoidosis. She reports not needing any refills today. She is due for visit with pulomonology. Her main complaint today is of respiratory difficulty. She is on home O2 at 2 liters. She reports having increased difficulty with SOB and wheezing for the last week.  Allergies  Allergen Reactions  . Sulfur Rash   Past Medical History:  Diagnosis Date  . Arthritis   . Asthma   . Cancer ([place])    lung, adenocarcinoma right lung 2012  . CHF (congestive heart failure) (HCC)    Preserved EF  . Congenital single kidney    With chronic kidney disease  . COPD (chronic obstructive pulmonary disease) ([place])   . Gout   . Hypertension   . Lung cancer [facility]) 2012   Right upper lobe lung adenocarcinoma diagnosed with needle biopsy treated by SBRT finished treatment April 2013 has been monitored since  . Mass of chest wall, right    Right chest wall mass 7.3 cm biopsy on 12/13/2013. Patient notes it was consistent with sarcoidosis but actual pathology results not available.  . Oxygen deficiency   . Sarcoidosis ([place])   . Sickle cell trait ([place])   . Sleep apnea    Current Outpatient Prescriptions on File Prior to Visit  Medication Sig Dispense Refill  . acetaminophen (TYLENOL) 500 MG tablet Take 1 tablet (500 mg total) by mouth every 6 (six) hours as needed. 30 tablet 2  . albuterol (PROAIR HFA) 108 (90 BASE) MCG/ACT inhaler Inhale 2 puffs into the lungs every 6 (six) hours as needed for wheezing or shortness of breath. 1 Inhaler 1  . albuterol (PROVENTIL) (2.5 MG/3ML) 0.083% nebulizer solution Take 3 mLs (2.5 mg total) by nebulization every 6  (six) hours as needed for wheezing or shortness of breath. 75 mL 12  . aspirin 81 MG tablet Take 1 tablet (81 mg total) by mouth daily. 90 tablet 5  . budesonide-formoterol (SYMBICORT) 80-4.5 MCG/ACT inhaler Inhale 2 puffs into the lungs 2 (two) times daily. 1 Inhaler 5  . Calcium Carbonate-Vitamin D (CALTRATE 600+D) 600-400 MG-UNIT tablet Take 1 tablet by mouth daily. 90 tablet 5  . docusate sodium (COLACE) 100 MG capsule Take 1 capsule (100 mg total) by mouth daily. 30 capsule 0  . febuxostat (ULORIC) 40 MG tablet Take 1 tablet (40 mg total) by mouth daily. 90 tablet 5  . ferrous sulfate 325 (65 FE) MG tablet Take 1 tablet (325 mg total) by mouth daily with breakfast. 90 tablet 0  . furosemide (LASIX) 40 MG tablet Take 1 tablet (40 mg total) by mouth 2 (two) times daily. 180 tablet 5  . potassium chloride (K-DUR) 10 MEQ tablet Take 1 tablet (10 mEq total) by mouth daily. 30 tablet 5  . predniSONE (DELTASONE) 10 MG tablet Take 1 tablet (10 mg total) by mouth daily with breakfast. 30 tablet 5   No current facility-administered medications on file prior to visit.    Family History  Problem Relation Age of Onset  . Hypertension Mother   . Renal Disease Mother   . Cancer Father   . Heart disease Father  No details  . Cancer Brother   . Diabetes Brother   . Cervical cancer Sister   . Diabetes Sister   . Multiple myeloma Sister    Social History   Social History  . Marital status: Divorced    Spouse name: N/A  . Number of children: 2  . Years of education: N/A   Occupational History  . Not on file.   Social History Main Topics  . Smoking status: Never Smoker  . Smokeless tobacco: Never Used  . Alcohol use No  . Drug use: No  . Sexual activity: Not on file   Other Topics Concern  . Not on file   Social History Narrative   Lives with son.  One son is deceased.      Review of Systems: Constitutional: Negative Skin: Negative HENT: Negative  Eyes: Negative  Neck:  Negative Respiratory: Shortness of breath, coughing, wheezing Cardiovascular: Negative Gastrointestinal: Negative Genitourinary: Negative  Musculoskeletal: Negative   Neurological: Negative  Hematological: Negative  Psychiatric/Behavioral: Negative    Objective:   Vitals:   04/03/16 0836  BP: 135/63  Pulse: 67  Resp: 18  Temp: 98.1 F (36.7 C)    Physical Exam: Constitutional: Patient appears well-developed and well-nourished. No distress. HENT: Normocephalic, atraumatic, External right and left ear normal. Oropharynx is clear and moist.  Eyes: Conjunctivae and EOM are normal. PERRLA, no scleral icterus. Neck: Normal ROM. Neck supple. No lymphadenopathy, No thyromegaly. CVS: RRR, S1/S2 +, no murmurs, no gallops, no rubs Pulmonary: There is increased work of breathing at a rate of 20, there are crackles bilaterally. Abdominal: Soft. Normoactive BS,, no distension, tenderness, rebound or guarding.  Musculoskeletal: Normal range of motion. No edema and no tenderness.  Neuro: Alert.Normal muscle tone coordination. Non-focal Skin: Skin is warm and dry. No rash noted. Not diaphoretic. No erythema. No pallor. Psychiatric: Normal mood and affect. Behavior, judgment, thought content normal.  Lab Results  Component Value Date   WBC 6.0 11/28/2015   HGB 11.3 (L) 11/28/2015   HCT 35.5 11/28/2015   MCV 69.9 (L) 11/28/2015   PLT 147 11/28/2015   Lab Results  Component Value Date   CREATININE 1.58 (H) 02/21/2016   BUN 29 (H) 02/21/2016   NA 141 02/21/2016   K 3.8 02/21/2016   CL 101 02/21/2016   CO2 29 02/21/2016    Lab Results  Component Value Date   HGBA1C 5.9 (H) 07/16/2015   Lipid Panel     Component Value Date/Time   CHOL 171 04/13/2014 1234   TRIG 116 04/13/2014 1234   HDL 59 04/13/2014 1234   CHOLHDL 2.9 04/13/2014 1234   VLDL 23 04/13/2014 1234   LDLCALC 89 04/13/2014 1234       Assessment and plan:   1. Anemia, unspecified anemia type  - CBC with  Differential  2. Need for prophylactic vaccination and inoculation against influenza  - Flu Vaccine QUAD 36+ mos PF IM (Fluarix & Fluzone Quad PF)   3. Need for prophylactic vaccination against Streptococcus pneumoniae (pneumococcus) - pneumococcal 13-valent conjugate vaccine (PREVNAR 13) injection 0.5 mL; Inject 0.5 mLs into the muscle Prior to discharge for immunization.   4. Need for hepatitis C screening test  - Hepatitis C Antibody    8. Ingrown nail Referral to podiatry. - Ambulatory referral to Podiatry   No Follow-up on file.  The patient was given clear instructions to go to ER or return to medical center if symptoms don't improve, worsen or new problems develop.  The patient verbalized understanding.    Micheline Chapman FNP  04/03/2016, 9:53 AM

## 2016-04-03 NOTE — Progress Notes (Signed)
Patient is here for 3 month FU  Patient denies pain at this time.  Patient has taken medication today and patient has eaten today.

## 2016-04-04 LAB — HEPATITIS C ANTIBODY: HCV Ab: NEGATIVE

## 2016-04-08 ENCOUNTER — Emergency Department (HOSPITAL_COMMUNITY): Payer: Medicare Other

## 2016-04-08 ENCOUNTER — Encounter (HOSPITAL_COMMUNITY): Payer: Self-pay | Admitting: *Deleted

## 2016-04-08 ENCOUNTER — Emergency Department (HOSPITAL_COMMUNITY)
Admission: EM | Admit: 2016-04-08 | Discharge: 2016-04-08 | Disposition: A | Discharge status: Home / Self Care | Payer: Medicare Other | Attending: Emergency Medicine | Admitting: Emergency Medicine

## 2016-04-08 DIAGNOSIS — J441 Chronic obstructive pulmonary disease with (acute) exacerbation: Secondary | ICD-10-CM | POA: Insufficient documentation

## 2016-04-08 DIAGNOSIS — N183 Chronic kidney disease, stage 3 (moderate): Secondary | ICD-10-CM | POA: Diagnosis not present

## 2016-04-08 DIAGNOSIS — Z7982 Long term (current) use of aspirin: Secondary | ICD-10-CM | POA: Insufficient documentation

## 2016-04-08 DIAGNOSIS — Z79899 Other long term (current) drug therapy: Secondary | ICD-10-CM | POA: Diagnosis not present

## 2016-04-08 DIAGNOSIS — I5032 Chronic diastolic (congestive) heart failure: Secondary | ICD-10-CM | POA: Diagnosis not present

## 2016-04-08 DIAGNOSIS — R0602 Shortness of breath: Secondary | ICD-10-CM | POA: Diagnosis not present

## 2016-04-08 DIAGNOSIS — Z85118 Personal history of other malignant neoplasm of bronchus and lung: Secondary | ICD-10-CM | POA: Diagnosis not present

## 2016-04-08 DIAGNOSIS — R0600 Dyspnea, unspecified: Secondary | ICD-10-CM | POA: Diagnosis present

## 2016-04-08 DIAGNOSIS — I130 Hypertensive heart and chronic kidney disease with heart failure and stage 1 through stage 4 chronic kidney disease, or unspecified chronic kidney disease: Secondary | ICD-10-CM | POA: Diagnosis not present

## 2016-04-08 LAB — COMPREHENSIVE METABOLIC PANEL
ALK PHOS: 155 U/L — AB (ref 38–126)
ALT: 27 U/L (ref 14–54)
AST: 43 U/L — AB (ref 15–41)
Albumin: 3.9 g/dL (ref 3.5–5.0)
Anion gap: 9 (ref 5–15)
BUN: 26 mg/dL — AB (ref 6–20)
CALCIUM: 8.8 mg/dL — AB (ref 8.9–10.3)
CO2: 33 mmol/L — AB (ref 22–32)
CREATININE: 1.7 mg/dL — AB (ref 0.44–1.00)
Chloride: 100 mmol/L — ABNORMAL LOW (ref 101–111)
GFR calc Af Amer: 36 mL/min — ABNORMAL LOW (ref >60)
GFR calc non Af Amer: 31 mL/min — ABNORMAL LOW (ref >60)
GLUCOSE: 113 mg/dL — AB (ref 65–99)
Potassium: 3.4 mmol/L — ABNORMAL LOW (ref 3.5–5.1)
SODIUM: 142 mmol/L (ref 135–145)
Total Bilirubin: 0.5 mg/dL (ref 0.3–1.2)
Total Protein: 7.6 g/dL (ref 6.5–8.1)

## 2016-04-08 LAB — CBC WITH DIFFERENTIAL/PLATELET
Basophils Absolute: 0 10*3/uL (ref 0.0–0.1)
Basophils Relative: 0 %
EOS ABS: 1.6 10*3/uL — AB (ref 0.0–0.7)
EOS PCT: 23 %
HCT: 39 % (ref 36.0–46.0)
HEMOGLOBIN: 12.2 g/dL (ref 12.0–15.0)
LYMPHS ABS: 1.2 10*3/uL (ref 0.7–4.0)
Lymphocytes Relative: 17 %
MCH: 22.5 pg — AB (ref 26.0–34.0)
MCHC: 31.3 g/dL (ref 30.0–36.0)
MCV: 72 fL — ABNORMAL LOW (ref 78.0–100.0)
MONO ABS: 0.4 10*3/uL (ref 0.1–1.0)
MONOS PCT: 6 %
Neutro Abs: 3.8 10*3/uL (ref 1.7–7.7)
Neutrophils Relative %: 54 %
PLATELETS: 170 10*3/uL (ref 150–400)
RBC: 5.42 MIL/uL — ABNORMAL HIGH (ref 3.87–5.11)
RDW: 15.1 % (ref 11.5–15.5)
WBC: 7 10*3/uL (ref 4.0–10.5)

## 2016-04-08 LAB — TROPONIN I

## 2016-04-08 LAB — BRAIN NATRIURETIC PEPTIDE: B Natriuretic Peptide: 23.8 pg/mL (ref 0.0–100.0)

## 2016-04-08 MED ORDER — METHYLPREDNISOLONE SODIUM SUCC 125 MG IJ SOLR
125.0000 mg | Freq: Once | INTRAMUSCULAR | Status: AC
  Administered 2016-04-08: 125 mg via INTRAVENOUS
  Filled 2016-04-08: qty 2

## 2016-04-08 MED ORDER — PREDNISONE 20 MG PO TABS: 40.0000 mg | ORAL_TABLET | Freq: Every day | ORAL | 0 refills | Status: DC

## 2016-04-08 MED ORDER — ALBUTEROL (5 MG/ML) CONTINUOUS INHALATION SOLN
10.0000 mg/h | INHALATION_SOLUTION | RESPIRATORY_TRACT | Status: DC
  Administered 2016-04-08: 10 mg/h via RESPIRATORY_TRACT
  Filled 2016-04-08: qty 20

## 2016-04-08 NOTE — ED Triage Notes (Addendum)
Pt reports resp distress x 2-3 weeks, went to see her doctor Friday last week and was put on abx.  States she is not getting any better.  Pt's breathing is labored and shallow, but regular.  Pt has hx of COPD and sarcoidosis, on home O2 at 2L [place].  Pt is A&Ox 4.  Denies any pain at this time.  Pt reports using her neb tx around 0400 this am with some relief.  Diff breathing became severe yesterday.

## 2016-04-08 NOTE — ED Notes (Signed)
MD at bedside. 

## 2016-04-08 NOTE — Discharge Instructions (Signed)
As discussed, your evaluation today has been largely reassuring.  But, it is important that you monitor your condition carefully, and do not hesitate to return to the ED if you develop new, or concerning changes in your condition.  For the next 2 days please use your nebulizer every 4 hours in addition to the prescribed steroids.  Otherwise, please follow-up with your physician for appropriate ongoing care.

## 2016-04-08 NOTE — ED Notes (Signed)
Respiratory notified of continuous neb treatment.

## 2016-04-08 NOTE — ED Provider Notes (Signed)
[facility] DEPT Provider Note   CSN: [id number] Arrival date & time: 04/08/16  0827     History   Chief Complaint Chief Complaint  Patient presents with  . Respiratory Distress    HPI Pamela [name] is a 65 y.o. female.  HPI  Patient presents with concern of dyspnea. She notes that she has been feeling unwell for some time, has seen her physician, one week ago, and completed a course of antibiotics 2 days ago. However, she continues to have worsening dyspnea, most notable today. No improvement with the aforementioned antibiotics, nor with albuterol sessions, last just before ED transport. No ongoing fever, nausea, vomiting, pain. Patient acknowledges history of multiple medical issues, including CHF, COPD.   Past Medical History:  Diagnosis Date  . Arthritis   . Asthma   . Cancer ([place])    lung, adenocarcinoma right lung 2012  . CHF (congestive heart failure) (HCC)    Preserved EF  . Congenital single kidney    With chronic kidney disease  . COPD (chronic obstructive pulmonary disease) ([place])   . Gout   . Hypertension   . Lung cancer [facility]) 2012   Right upper lobe lung adenocarcinoma diagnosed with needle biopsy treated by SBRT finished treatment April 2013 has been monitored since  . Mass of chest wall, right    Right chest wall mass 7.3 cm biopsy on 12/13/2013. Patient notes it was consistent with sarcoidosis but actual pathology results not available.  . Oxygen deficiency   . Sarcoidosis ([place])   . Sickle cell trait ([place])   . Sleep apnea     Patient Active Problem List   Diagnosis Date Noted  . Microcytosis 11/28/2015  . 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 Onychomycosis 01/09/2015  . Ingrown nail 01/09/2015  . Pain in lower limb 01/09/2015  . Chronic respiratory failure with hypoxia and hypercapnia ([place]) 07/19/2014  . CHF (congestive heart failure) ([place]) 06/11/2014  . Anemia, iron deficiency 05/27/2014  . Acute gouty arthritis 05/25/2014  . Acute renal  failure superimposed on stage 3 chronic kidney disease ([place]) 05/25/2014  . Obstructive sleep apnea 05/25/2014  . Joint pain 05/24/2014  . Chronic diastolic CHF (congestive heart failure) ([place])   . Sarcoidosis ([place])   . Metabolic syndrome 84/16/6063  . Asthma, chronic 04/27/2014  . Anemia of chronic disease 04/19/2014  . Severe obesity (BMI >= 40) ([place]) 04/18/2014  . On home oxygen therapy 04/18/2014  . Immunization due 04/18/2014  . Need for prophylactic vaccination and inoculation against influenza 04/18/2014  . Prediabetes 04/13/2014  . Essential hypertension 04/13/2014  . Lower extremity edema 04/13/2014  . History of lung cancer 04/13/2014    Past Surgical History:  Procedure Laterality Date  . LUNG BIOPSY    . TUBAL LIGATION    . VEIN LIGATION AND STRIPPING      OB History    No data available       Home Medications    Prior to Admission medications   Medication Sig Start Date End Date Taking? Authorizing Provider  acetaminophen (TYLENOL) 500 MG tablet Take 1 tablet (500 mg total) by mouth every 6 (six) hours as needed. 04/16/15   Dorena Dew, FNP  albuterol (PROAIR HFA) 108 (90 BASE) MCG/ACT inhaler Inhale 2 puffs into the lungs every 6 (six) hours as needed for wheezing or shortness of breath. 11/30/14   Tanda Rockers, MD  albuterol (PROVENTIL) (2.5 MG/3ML) 0.083% nebulizer solution Take 3 mLs (2.5 mg total) by nebulization every 6 (six)  hours as needed for wheezing or shortness of breath. 06/06/15   Tanda Rockers, MD  aspirin 81 MG tablet Take 1 tablet (81 mg total) by mouth daily. 01/02/16   Dorena Dew, FNP  azithromycin (ZITHROMAX) 250 MG tablet Take 2 tablets day one and then one a day for 5 days. 04/03/16   Micheline Chapman, NP  budesonide-formoterol [facility]) 80-4.5 MCG/ACT inhaler Inhale 2 puffs into the lungs 2 (two) times daily. 01/02/16   Dorena Dew, FNP  Calcium Carbonate-Vitamin D (CALTRATE 600+D) 600-400 MG-UNIT tablet Take 1 tablet by  mouth daily. 01/02/16   Dorena Dew, FNP  docusate sodium (COLACE) 100 MG capsule Take 1 capsule (100 mg total) by mouth daily. 01/02/16   Dorena Dew, FNP  febuxostat (ULORIC) 40 MG tablet Take 1 tablet (40 mg total) by mouth daily. 01/02/16   Dorena Dew, FNP  ferrous sulfate 325 (65 FE) MG tablet Take 1 tablet (325 mg total) by mouth daily with breakfast. 01/02/16   Dorena Dew, FNP  furosemide (LASIX) 40 MG tablet Take 1 tablet (40 mg total) by mouth 2 (two) times daily. 01/02/16   Dorena Dew, FNP  potassium chloride (K-DUR) 10 MEQ tablet Take 1 tablet (10 mEq total) by mouth daily. 01/03/16   Dorena Dew, FNP  predniSONE (DELTASONE) 10 MG tablet Take 1 tablet (10 mg total) by mouth daily with breakfast. 01/02/16   Dorena Dew, FNP    Family History Family History  Problem Relation Age of Onset  . Hypertension Mother   . Renal Disease Mother   . Cancer Father   . Heart disease Father     No details  . Cancer Brother   . Diabetes Brother   . Cervical cancer Sister   . Diabetes Sister   . Multiple myeloma Sister     Social History Social History  Substance Use Topics  . Smoking status: Never Smoker  . Smokeless tobacco: Never Used  . Alcohol use No     Allergies   Sulfur   Review of Systems Review of Systems  Constitutional:       Per HPI, otherwise negative  HENT:       Per HPI, otherwise negative  Respiratory:       Per HPI, otherwise negative  Cardiovascular:       Per HPI, otherwise negative  Gastrointestinal: Negative for vomiting.  Endocrine:       Negative aside from HPI  Genitourinary:       Neg aside from HPI   Musculoskeletal:       Per HPI, otherwise negative  Skin: Negative.   Neurological: Negative for syncope.     Physical Exam Updated Vital Signs BP 156/80 (BP Location: Left Leg)   Pulse 80   Temp 98.1 F (36.7 C) (Oral)   Resp (!) 29   SpO2 93%   Physical Exam  Constitutional: She is oriented to  person, place, and time. She appears well-developed and well-nourished. No distress.  Morbidly obese female sitting upright, speaking clearly  HENT:  Head: Normocephalic and atraumatic.  Eyes: Conjunctivae and EOM are normal.  Cardiovascular: Normal rate and regular rhythm.   Pulmonary/Chest: No stridor.  Decreased breath sounds throughout, audible wheezing bilaterally  Abdominal: She exhibits no distension.  Musculoskeletal: She exhibits no edema.  Neurological: She is alert and oriented to person, place, and time. No cranial nerve deficit.  Skin: Skin is warm and dry.  Psychiatric: She  has a normal mood and affect.  Nursing note and vitals reviewed.    ED Treatments / Results  Labs (all labs ordered are listed, but only abnormal results are displayed) Labs Reviewed  COMPREHENSIVE METABOLIC PANEL - Abnormal; Notable for the following:       Result Value   Potassium 3.4 (*)    Chloride 100 (*)    CO2 33 (*)    Glucose, Bld 113 (*)    BUN 26 (*)    Creatinine, Ser 1.70 (*)    Calcium 8.8 (*)    AST 43 (*)    Alkaline Phosphatase 155 (*)    GFR calc non Af Amer 31 (*)    GFR calc Af Amer 36 (*)    All other components within normal limits  CBC WITH DIFFERENTIAL/PLATELET - Abnormal; Notable for the following:    RBC 5.42 (*)    MCV 72.0 (*)    MCH 22.5 (*)    Eosinophils Absolute 1.6 (*)    All other components within normal limits  TROPONIN I  BRAIN NATRIURETIC PEPTIDE    EKG  EKG Interpretation  Date/Time:  Wednesday April 08 2016 08:43:26 EDT Ventricular Rate:  86 PR Interval:    QRS Duration: 104 QT Interval:  371 QTC Calculation: 444 R Axis:   -68 Text Interpretation:  Sinus rhythm Left anterior fascicular block Baseline wander in lead(s) V1 V5 Artifact Abnormal ekg Confirmed by Carmin Muskrat  MD ([id number]) on 04/08/2016 9:00:28 AM       Radiology Dg Chest 2 View  Result Date: 04/08/2016 CLINICAL DATA:  Shortness of breath. EXAM: CHEST  2 VIEW  COMPARISON:  06/07/2015, 07/19/2014.  PET-CT 05/20/2015 FINDINGS: Mediastinum and hilar structures stable. Mild right hilar fullness is most likely vascular and is unchanged. Stable cardiomegaly. Low lung volumes. Mild bibasilar subsegmental atelectasis and or infiltrates. No acute bony abnormality . IMPRESSION: 1. Stable mild cardiomegaly. 2. Low lung volumes with mild bibasilar subsegmental atelectasis and/or infiltrates . Electronically Signed   By: Marcello Moores  Register   On: 04/08/2016 09:30    Procedures Procedures (including critical care time)  Medications Ordered in ED Medications  albuterol (PROVENTIL,VENTOLIN) solution continuous neb (0 mg/hr Nebulization Stopped 04/08/16 1202)  methylPREDNISolone sodium succinate (SOLU-MEDROL) 125 mg/2 mL injection 125 mg (125 mg Intravenous Given 04/08/16 1003)     Initial Impression / Assessment and Plan / ED Course  I have reviewed the triage vital signs and the nursing notes.  Pertinent labs & imaging results that were available during my care of the patient were reviewed by me and considered in my medical decision making (see chart for details).  2:03 PM Patient substantially better, no wheezing bilaterally, she is smiling, sitting upright. We discussed all findings at length, need for close monitoring, follow-up, medication compliance.    Final Clinical Impressions(s) / ED Diagnoses   Final diagnoses:  COPD exacerbation ([place])  Elderly female with COPD, morbid obesity, CHF presents with dyspnea, respiratory distress. Here, the patient is awake and alert, but has notable wheezing on initial exam. Patient had substantial improvement with steroids, albuterol, ipratropium. No evidence for decompensated heart failure, ACS, PE. Patient's condition was stable, she was discharged in stable condition with scheduled steroids, nebulizer solution.  New Prescriptions New Prescriptions   PREDNISONE (DELTASONE) 20 MG TABLET    Take 2 tablets (40 mg  total) by mouth daily with breakfast. For the next four days     Carmin Muskrat, MD 04/08/16 1404

## 2016-04-11 IMAGING — DX DG CHEST 2V
2 series · 2 of 2 positions shown · non-contrast
Comparison: PET-CT study May 20, 2015 and chest x-ray July 19, 2014.

CLINICAL DATA: Shortness of breath for 5 days, history of lung
malignancy on the right, sarcoidosis, asthma -COPD, CHF, right chest
wall mass.

EXAM:
CHEST  2 VIEW

[chest pa]
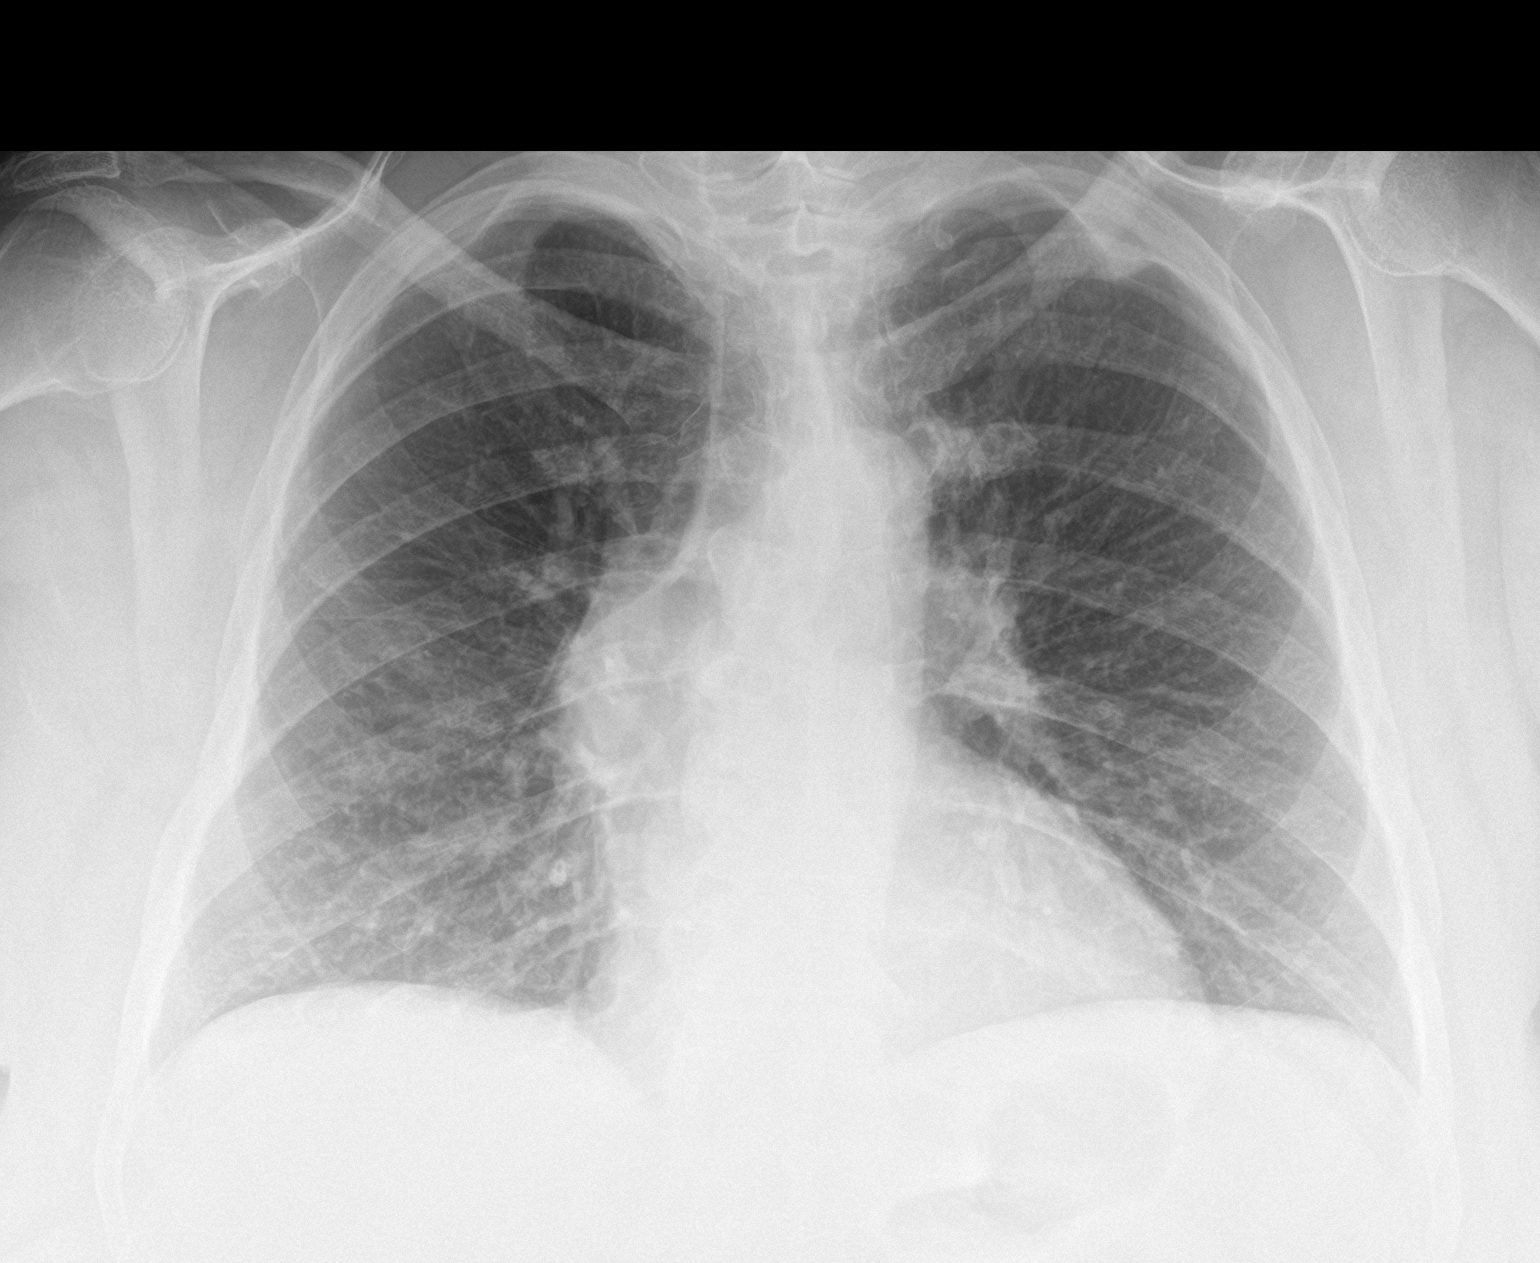

[chest lat]
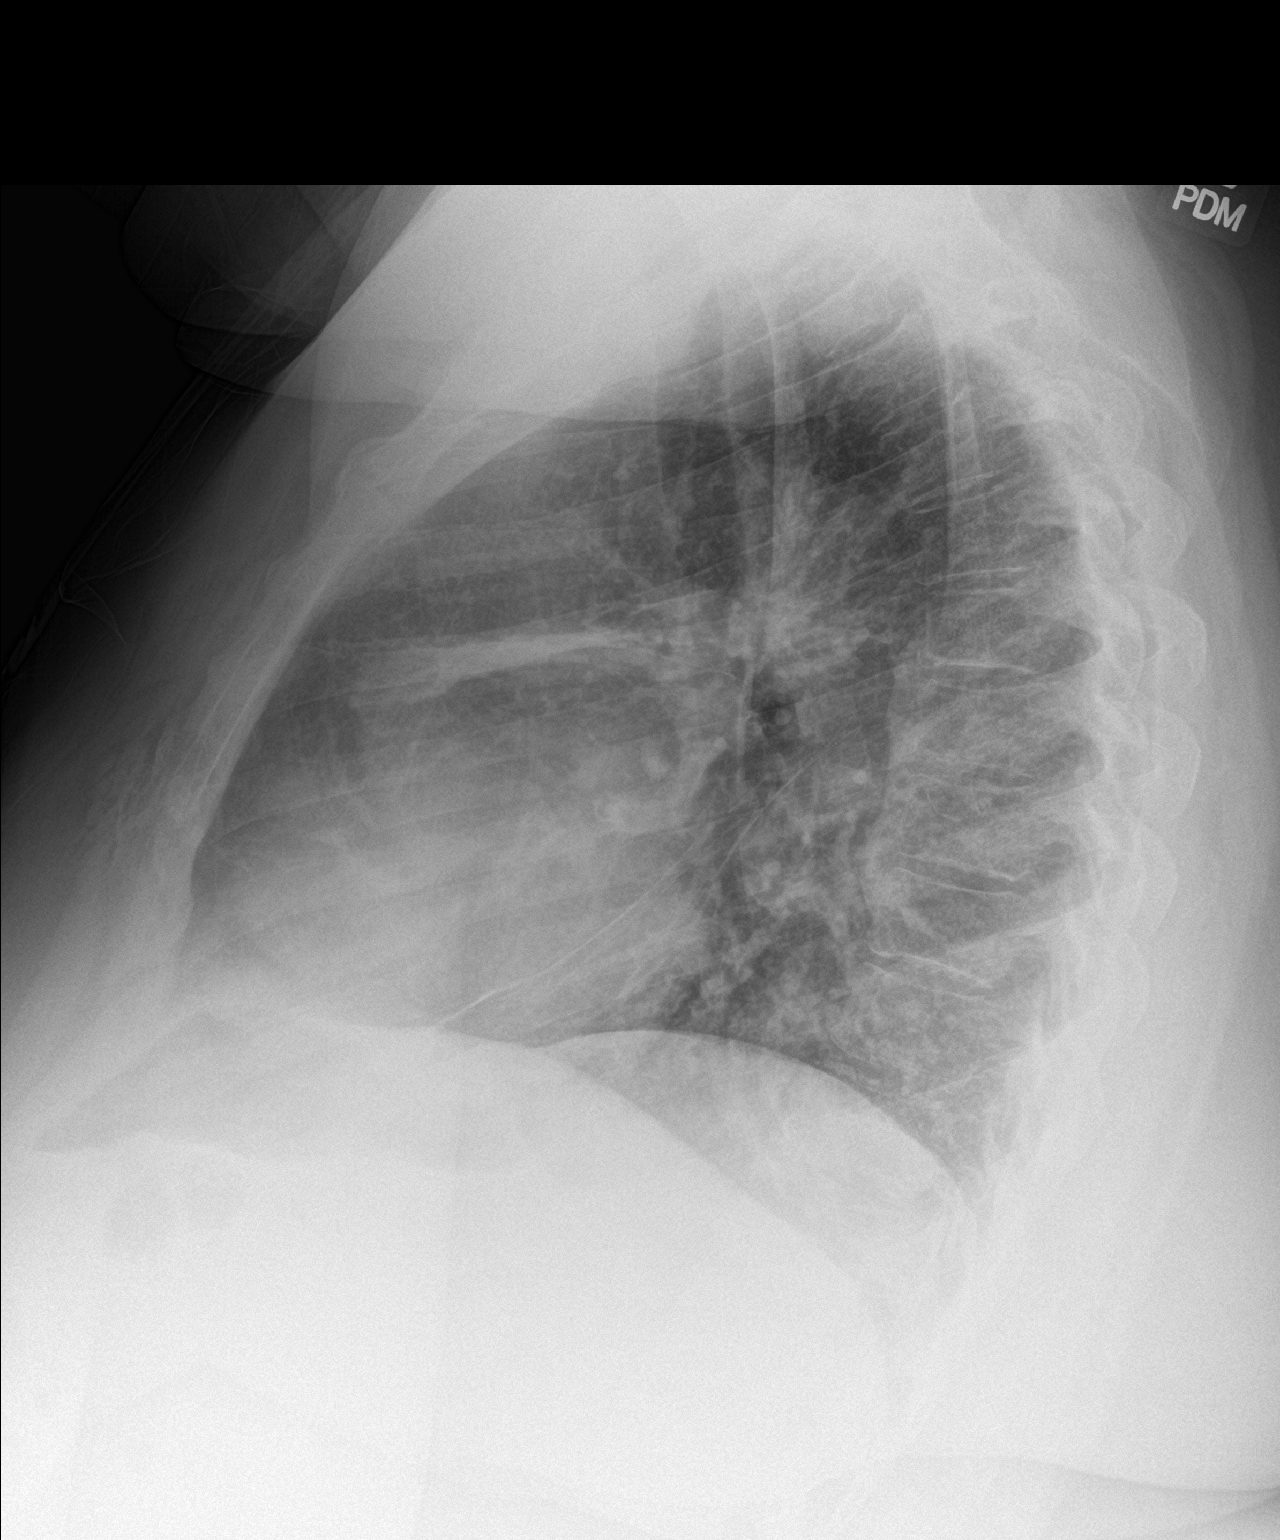

[2 of 2 positions shown; findings below may reference images not displayed]

FINDINGS: The lungs are adequately inflated. The interstitial markings are
coarse and slightly more conspicuous today. There is stable right
hilar prominence. The heart is normal in size. The pulmonary
vascularity is not engorged. The mediastinum is normal in width.
There is no pleural effusion or pneumothorax. There is stable
parenchymal density on the right anteriorly at approximately the
level of the minor fissure. The observed bony thorax exhibits no
acute abnormality.
IMPRESSION: Slight interval increase in the pulmonary interstitial markings
especially on the left. There are no pulmonary parenchymal masses,
nodules, or alveolar infiltrates. Stable right hilar prominence and
right midlung parenchymal density.

## 2016-04-20 ENCOUNTER — Other Ambulatory Visit: Payer: Self-pay | Admitting: Family Medicine

## 2016-04-20 DIAGNOSIS — Z1231 Encounter for screening mammogram for malignant neoplasm of breast: Secondary | ICD-10-CM

## 2016-04-28 ENCOUNTER — Ambulatory Visit (INDEPENDENT_AMBULATORY_CARE_PROVIDER_SITE_OTHER): Payer: Medicare Other | Admitting: Adult Health

## 2016-04-28 ENCOUNTER — Encounter: Payer: Self-pay | Admitting: Adult Health

## 2016-04-28 DIAGNOSIS — J4541 Moderate persistent asthma with (acute) exacerbation: Secondary | ICD-10-CM

## 2016-04-28 DIAGNOSIS — Z85118 Personal history of other malignant neoplasm of bronchus and lung: Secondary | ICD-10-CM

## 2016-04-28 DIAGNOSIS — J9612 Chronic respiratory failure with hypercapnia: Secondary | ICD-10-CM

## 2016-04-28 DIAGNOSIS — J9611 Chronic respiratory failure with hypoxia: Secondary | ICD-10-CM | POA: Diagnosis not present

## 2016-04-28 DIAGNOSIS — D869 Sarcoidosis, unspecified: Secondary | ICD-10-CM | POA: Diagnosis not present

## 2016-04-28 LAB — NITRIC OXIDE: NITRIC OXIDE: 70

## 2016-04-28 MED ORDER — ALBUTEROL SULFATE HFA 108 (90 BASE) MCG/ACT IN AERS: 2.0000 | INHALATION_SPRAY | Freq: Four times a day (QID) | RESPIRATORY_TRACT | 3 refills | Status: DC | PRN

## 2016-04-28 MED ORDER — BUDESONIDE-FORMOTEROL FUMARATE 80-4.5 MCG/ACT IN AERO: 2.0000 | INHALATION_SPRAY | Freq: Two times a day (BID) | RESPIRATORY_TRACT | 5 refills | Status: DC

## 2016-04-28 NOTE — Progress Notes (Signed)
Subjective:    Patient ID: Pamela [name], female    DOB: [birth year]  MRN: [id number]   Brief patient profile:  61 yobf never smoker with 1st asthma attack in 1990s and on maint rx since late 90's maint rx and freq courses of prednisone 3-4 x per year despite advair and spiriva and freq saba referred to pulmonary clinic 04/27/2014 by Dr [name] s/p CT Bx 06/25/11 > no path report > RT only per pt RUL completed 2013     History of Present Illness  04/27/2014 1st [name] office visit/ [name]   Chief Complaint  Patient presents with  . Pulmonary Consult    Referred by Dr. [name]. [name]  Pt recently moved to [name] from [name] and states needs to establish with new pulmonary MD.  She c/o DOE and cough, and states that she feels these symptoms are currently under control.    on prednisone 10 mg daily  since late July 2015 and still on freq saba and 2lpm  Already used   2puffs proair am of ov  rec Stop spiriva and advair Start dulera 100 Take 2 puffs first thing in am and then another 2 puffs about 12 hours later.  Work on inhaler technique:  relax and gently blow all the way out then take a nice smooth deep breath back in, triggering the inhaler at same time you start breathing in.  Hold for up to 5 seconds if you can.  Rinse and gargle with water when done Only use your albuterol (proair) as a rescue medication  If proair not helping, then use the neb and if needing the neb more than occastional, then take prednisone 10 mg daily  Please schedule a follow up office visit in 4 weeks, sooner if needed with all inhalers in hand for pfts on return   11/30/2014 f/u ov/[name] re: mild intermittent asthma ? Related to weather / still on acei plus on prednisone 10 mg daily for gout  Chief Complaint  Patient presents with  . Acute Visit    Pt c/o increased SOB for the past month. She is also coughing and wheezing. Cough is prod with  minimal yellow to clear sputum. She uses proair 2 x daily on average.   Also due for CT chest to f/u dx of lung ca dx March 2013  never surgery/ RT only  Onset of dry cough / wheeze was insidious/ pattern is progressively worse day >> noct with increasing  need for saba but very poor hfa  rec Stop lisinopril and start valsartan 80 mg one daily instead  Only use your albuterol as a rescue medication   Restart dulera 100 Take 2 puffs first thing in am and then another 2 puffs about 12 hours later until you only need  your rescue inhaler no more than twice weekly for cough/wheeze/ short of breath  Follow up with Dr [name] as planned and only see me if needed    06/06/2015  f/u ov/[name] re: worse since finished pred ? One week prior to [name]  / was taking for gout  Chief Complaint  Patient presents with  . Acute Visit    Increased SOB with or without exertion for the past 4 days. She also c/o cough and wheezing. Cough is prod with clear sputum.     Was doing much better while on prednisone and gradually worse since off  Last used  proventil about 7 am prior to OV  At 1130  On symbicort but hfa quite poor > see a/p Has neb but no meds for it  rec Prednisone Take 4 for three days 3 for three days 2 for three days 1 for three days and stop Work on inhaler technique:  relax and gently blow all the way out then take a nice smooth deep breath back in, triggering the inhaler at same time you start breathing in.  Hold for up to 5 seconds if you can. Blow out thru nose. Rinse and gargle with water when done Plan A = Automatic = symbicort 80 Take 2 puffs first thing in am and then another 2 puffs about 12 hours later.  Plan B = backup  Only use your albuterol(proair)   Plan C = crisis - only use nebulizer albuterol if you try the proair and it doesn't work       07/02/2015  f/u ov/[name] re: asthma vs uacs on pred 10 for gout and symb 80 2bid  and no saba  Chief Complaint  Patient presents with  .  Follow-up    Breathing is much improved. She has not had to use rescue inhaler or neb.   >>no changes   04/28/2016 ER Follow up : Asthma /Sarcoid /Lung Cancer hx  Pt returns for ER follow up . Seen in ER on 04/08/16 for cough and dyspnea for 2 weeks.  She was treated with nebs and IV steroids. Discharged on prednisone '40mg'$  daily for 4 d.  BNP and troponin were nml. CXR without acute process.  She remains on Symbicort.. Rate use of albuterol.  Previous PFT 2015 with FEV1 93%, ratio 85, +BD response.  Is working on weight loss. Is down 40lbs since last ov.  Struggles with fluid retention, on Lasix '40mg'$  Twice daily  .  Has chronic LE swelling. She has CKD followed by [name].  Remains on o2 at 2l/m .  Split night sleep study in 07/2014 showed mild OSA only with AHI 6.9. O2 sats nml on 2l/m .  Over last 2 weeks has had to use albuterol 1 x day.  Since ER she is some better but not back to baseline. Still has intermittent wheezing but decreased .  No chest pain, orthopnea, edema or fever.  Flu shot is utd.  FENO today 04/28/2016  is 70 .     Moved here in 2015 From [name].  Dx with lung cancer 2012, tx with radiation 2013. At [facility].  Now sees Dr. Irene Limbo with Cancer center . Has CT scan due 05/26/16 . No evidence of recurrence based on PET/CT scan done on 05/20/2015. She is a never smoker but has second hand smoke exposure.   She also carries dx of Sarcoid from previous bx. (followed previously by Dr. Bartholome Bill at [facility], with lymphadenopathy in chest , abd w/ possible spleen and liver involvement.   She has right anterior chest wall slowly growing masslike lesion in the right pectoralis minor muscle. Unclear etiology with FNA. Followed on CT chest .  She is on prednisone '10mg'$  daily for gout.   Current Medications, Allergies, Complete Past Medical History, Past Surgical History, Family History, and Social History were reviewed in [employer] record.  ROS  The following are not active complaints unless bolded sore throat, dysphagia, dental problems, itching, sneezing,  nasal congestion or excess/ purulent secretions, ear ache,   fever, chills, sweats, unintended wt  loss, pleuritic or exertional cp, hemoptysis,  orthopnea pnd or leg swelling, presyncope, palpitations, heartburn, abdominal pain, anorexia, nausea, vomiting, diarrhea  or change in bowel or urinary habits, change in stools or urine, dysuria,hematuria,  rash, arthralgias, visual complaints, headache, numbness weakness or ataxia oror coordination,  change in mood/affect or memory.           Objective:   Physical Exam  amb massively obese  Bf   Vitals:   04/28/16 1021  BP: 124/78  Pulse: 74  SpO2: 94%  Weight: (!) 326 lb (147.9 kg)  Height: '5\' 6"'$  (1.676 m)  Body mass index is 52.62 kg/m.      11/30/2014        343 > 06/06/2015  355 > 07/03/2015  360 >326    Vital signs reviewed      HEENT: nl dentition, turbinates, and orophanx. Nl external ear canals without cough reflex   NECK :  without JVD/Nodes/TM/ nl carotid upstrokes bilaterally   LUNGS: no acc muscle use, few exp wheezes   CV:  RRR  no s3 or murmur or increase in P2  , 1-2 + edema, chronic venous insufficiency   ABD:  Massively obese/ soft and nontender with limited excursion.    MS:  warm without deformities, calf tenderness, cyanosis or clubbing  SKIN: warm and dry without lesions    Ext:  Intact , walks with cane.         CXR:  04/08/16  Stable mild cardiomegaly. Low lung volumes with mild bibasilar subsegmental atelectasis and/or infiltrates .   Othel Dicostanzo NP-C  [facility]  04/28/2016

## 2016-04-28 NOTE — Assessment & Plan Note (Signed)
Asthma exacerbation with slow improvement  Give slight bump in steroids briefly.   Plan  Patient Instructions  Continue on Symbicort 2 puffs Twice daily   Add Zyrtec '10mg'$  At bedtime  As needed  Drainage.  Add Pepcid '20mg'$  At bedtime  .  Increase prednisone '20mg'$  daily for 1 week then back to '10mg'$ .  Follow up Dr. Melvyn Novas  In 3 months and As needed

## 2016-04-28 NOTE — Patient Instructions (Addendum)
Continue on Symbicort 2 puffs Twice daily   Add Zyrtec '10mg'$  At bedtime  As needed  Drainage.  Add Pepcid '20mg'$  At bedtime  .  Increase prednisone '20mg'$  daily for 1 week then back to '10mg'$ .  Follow up Dr. Melvyn Novas  In 3 months and As needed

## 2016-04-28 NOTE — Assessment & Plan Note (Signed)
Cont on O2 , compensated on 2l/m

## 2016-04-28 NOTE — Assessment & Plan Note (Signed)
Appears stable without flare  Cont to monitor on CT chest  Monitor PFT as indicated.

## 2016-04-28 NOTE — Assessment & Plan Note (Signed)
Hx of lung cancer s/p XRT in 2012/2013 .  Stable on CT/PET  Cont w/ follow up as planned with oncology .

## 2016-04-28 NOTE — Progress Notes (Signed)
Chart and office note reviewed in detail  > agree with a/p as outlined    

## 2016-04-30 ENCOUNTER — Ambulatory Visit
Admission: RE | Admit: 2016-04-30 | Discharge: 2016-04-30 | Disposition: A | Discharge status: Home / Self Care | Payer: Medicare Other | Source: Ambulatory Visit | Attending: Family Medicine | Admitting: Family Medicine

## 2016-04-30 DIAGNOSIS — Z1231 Encounter for screening mammogram for malignant neoplasm of breast: Secondary | ICD-10-CM | POA: Diagnosis not present

## 2016-05-01 ENCOUNTER — Telehealth: Payer: Self-pay | Admitting: *Deleted

## 2016-05-01 NOTE — Telephone Encounter (Signed)
-----   Message from Tanda Rockers, MD sent at 04/30/2016  5:04 PM EDT ----- Move up f/u with me for 4 weeks with all meds in hand

## 2016-05-01 NOTE — Telephone Encounter (Signed)
LMTCB

## 2016-05-04 NOTE — Telephone Encounter (Signed)
Patient called back - scheduled appt for 06/01/16 w/ MW advised to bring meds -pr

## 2016-05-26 ENCOUNTER — Encounter (HOSPITAL_COMMUNITY): Payer: Self-pay

## 2016-05-26 ENCOUNTER — Ambulatory Visit (HOSPITAL_COMMUNITY)
Admission: RE | Admit: 2016-05-26 | Discharge: 2016-05-26 | Disposition: A | Discharge status: Home / Self Care | Payer: Medicare Other | Source: Ambulatory Visit | Attending: Hematology | Admitting: Hematology

## 2016-05-26 ENCOUNTER — Other Ambulatory Visit (HOSPITAL_BASED_OUTPATIENT_CLINIC_OR_DEPARTMENT_OTHER): Payer: Medicare Other

## 2016-05-26 DIAGNOSIS — R222 Localized swelling, mass and lump, trunk: Secondary | ICD-10-CM | POA: Diagnosis not present

## 2016-05-26 DIAGNOSIS — Z85118 Personal history of other malignant neoplasm of bronchus and lung: Secondary | ICD-10-CM

## 2016-05-26 DIAGNOSIS — R718 Other abnormality of red blood cells: Secondary | ICD-10-CM | POA: Diagnosis not present

## 2016-05-26 DIAGNOSIS — C3490 Malignant neoplasm of unspecified part of unspecified bronchus or lung: Secondary | ICD-10-CM | POA: Diagnosis not present

## 2016-05-26 LAB — COMPREHENSIVE METABOLIC PANEL
ALBUMIN: 3 g/dL — AB (ref 3.5–5.0)
ALK PHOS: 174 U/L — AB (ref 40–150)
ALT: 17 U/L (ref 0–55)
AST: 18 U/L (ref 5–34)
Anion Gap: 9 mEq/L (ref 3–11)
BILIRUBIN TOTAL: 0.44 mg/dL (ref 0.20–1.20)
BUN: 26.7 mg/dL — AB (ref 7.0–26.0)
CO2: 34 mEq/L — ABNORMAL HIGH (ref 22–29)
Calcium: 9.1 mg/dL (ref 8.4–10.4)
Chloride: 101 mEq/L (ref 98–109)
Creatinine: 1.4 mg/dL — ABNORMAL HIGH (ref 0.6–1.1)
EGFR: 45 mL/min/{1.73_m2} — AB (ref >90)
GLUCOSE: 93 mg/dL (ref 70–140)
Potassium: 3.8 mEq/L (ref 3.5–5.1)
SODIUM: 144 meq/L (ref 136–145)
TOTAL PROTEIN: 6.7 g/dL (ref 6.4–8.3)

## 2016-05-26 LAB — CBC & DIFF AND RETIC
BASO%: 0.3 % (ref 0.0–2.0)
BASOS ABS: 0 10*3/uL (ref 0.0–0.1)
EOS ABS: 0.7 10*3/uL — AB (ref 0.0–0.5)
EOS%: 10.3 % — ABNORMAL HIGH (ref 0.0–7.0)
HCT: 37.3 % (ref 34.8–46.6)
HEMOGLOBIN: 11.6 g/dL (ref 11.6–15.9)
IMMATURE RETIC FRACT: 9.6 % (ref 1.60–10.00)
LYMPH%: 24.6 % (ref 14.0–49.7)
MCH: 22.1 pg — ABNORMAL LOW (ref 25.1–34.0)
MCHC: 31.1 g/dL — ABNORMAL LOW (ref 31.5–36.0)
MCV: 71 fL — AB (ref 79.5–101.0)
MONO#: 0.5 10*3/uL (ref 0.1–0.9)
MONO%: 7.8 % (ref 0.0–14.0)
NEUT%: 57 % (ref 38.4–76.8)
NEUTROS ABS: 4 10*3/uL (ref 1.5–6.5)
Platelets: 156 10*3/uL (ref 145–400)
RBC: 5.25 10*6/uL (ref 3.70–5.45)
RDW: 14.9 % — ABNORMAL HIGH (ref 11.2–14.5)
Retic %: 1.7 % (ref 0.70–2.10)
Retic Ct Abs: 89.25 10*3/uL (ref 33.70–90.70)
WBC: 6.9 10*3/uL (ref 3.9–10.3)
lymph#: 1.7 10*3/uL (ref 0.9–3.3)

## 2016-05-26 LAB — FERRITIN: Ferritin: 266 ng/ml (ref 9–269)

## 2016-05-27 LAB — SEDIMENTATION RATE: SED RATE: 29 mm/h (ref 0–40)

## 2016-05-29 ENCOUNTER — Ambulatory Visit (HOSPITAL_BASED_OUTPATIENT_CLINIC_OR_DEPARTMENT_OTHER): Payer: Medicare Other | Admitting: Hematology

## 2016-05-29 ENCOUNTER — Telehealth: Payer: Self-pay | Admitting: Hematology

## 2016-05-29 ENCOUNTER — Encounter: Payer: Self-pay | Admitting: Hematology

## 2016-05-29 VITALS — BP 144/45 | HR 74 | Resp 20 | Wt 357.9 lb

## 2016-05-29 DIAGNOSIS — Z85118 Personal history of other malignant neoplasm of bronchus and lung: Secondary | ICD-10-CM | POA: Diagnosis not present

## 2016-05-29 DIAGNOSIS — R222 Localized swelling, mass and lump, trunk: Secondary | ICD-10-CM

## 2016-05-29 NOTE — Telephone Encounter (Signed)
Appointments scheduled per 11/10 LOS. Patient given AVS report and calendars with future scheduled appointments. °

## 2016-05-31 NOTE — Progress Notes (Signed)
Pamela [name] [facility]    HEMATOLOGY/ONCOLOGY CLINIC NOTE  Date of Service: 05/29/2016   Patient Care Team: Dorena Dew, FNP as PCP - General (Family Medicine)  CHIEF COMPLAINTS/PURPOSE OF CONSULTATION:  Followup of lung cancer  DIAGNOSIS 1. Right upper lobe T1b, N0, M0 primary lung adenocarcinoma diagnosed in December 2012. She was deemed not to be a surgical candidate. Treated by radiation oncology with SBRT at [facility]. Completed treatment in March 2013 and has been monitored since with no evidence of recurrence based on PET/CT scan done on 05/20/2015.  2.  Right anterior chest wall slowly growing masslike lesion in the right pectoralis minor muscle. Unclear etiology. Biopsy was done on 12/14/2013 at the [place] [facility] [name]. FNA showed skeletal muscles, macrophages and spindle cells some with cytologic atypia. Needle core biopsy showed soft tissue and skeletal muscle with extensive necrosis; rare spindle cells with cytologic atypia. Treatment effect causing reactive atypia cannot be ruled out. Clinical correlation was advised to ensure the sample is representative. PET/CT scan done here on 05/20/2015 shows no change in metabolic activity or size of the medial right chest wall mass suggesting benign etiology.  INTERVAL HISTORY  Ms Bringle is here for her scheduled 6 month follow-up.  She notes that she is doing well overall and has no new acute concerns.  No new bone pains.  Breathing is stable. Had a CT of the chest on 05/26/2016 Stable CT of the chest. No evidence of local lung cancer recurrence or metastatic disease. Medial right ventral chest wall mass is stable when compared with previous exam. She is in good spirits and has no other acute new concerns. She continues to follow with pulmonology for management of her sarcoidosis.  MEDICAL HISTORY:  Past Medical History:  Diagnosis Date  . Arthritis   . Asthma   . Cancer ([place])    lung, adenocarcinoma right lung 2012  . CHF  (congestive heart failure) (HCC)    Preserved EF  . Congenital single kidney    With chronic kidney disease  . COPD (chronic obstructive pulmonary disease) ([place])   . Gout   . Hypertension   . Lung cancer [facility]) 2012   Right upper lobe lung adenocarcinoma diagnosed with needle biopsy treated by SBRT finished treatment April 2013 has been monitored since  . Mass of chest wall, right    Right chest wall mass 7.3 cm biopsy on 12/13/2013. Patient notes it was consistent with sarcoidosis but actual pathology results not available.  . Oxygen deficiency   . Sarcoidosis ([place])   . Sickle cell trait ([place])   . Sleep apnea    CKD ([place] 11/21/2015 showed no urinary tract abnormalities.) Gout on Uloric - tea as a trigger.  SURGICAL HISTORY: Past Surgical History:  Procedure Laterality Date  . LUNG BIOPSY    . TUBAL LIGATION    . VEIN LIGATION AND STRIPPING      SOCIAL HISTORY: Social History   Social History  . Marital status: Divorced    Spouse name: N/A  . Number of children: 2  . Years of education: N/A   Occupational History  . Not on file.   Social History Main Topics  . Smoking status: Never Smoker  . Smokeless tobacco: Never Used  . Alcohol use No  . Drug use: No  . Sexual activity: Not on file   Other Topics Concern  . Not on file   Social History Narrative   Lives with son.  One son is deceased.  FAMILY HISTORY: Family History  Problem Relation Age of Onset  . Hypertension Mother   . Renal Disease Mother   . Cancer Father   . Heart disease Father     No details  . Cancer Brother   . Diabetes Brother   . Cervical cancer Sister   . Diabetes Sister   . Multiple myeloma Sister     ALLERGIES:  is allergic to sulfur.  MEDICATIONS:  Current Outpatient Prescriptions  Medication Sig Dispense Refill  . acetaminophen (TYLENOL) 500 MG tablet Take 1 tablet (500 mg total) by mouth every 6 (six) hours as needed. (Patient taking differently: Take 500 mg by mouth every  6 (six) hours as needed for moderate pain. ) 30 tablet 2  . albuterol (PROAIR HFA) 108 (90 Base) MCG/ACT inhaler Inhale 2 puffs into the lungs every 6 (six) hours as needed for wheezing or shortness of breath. 1 Inhaler 3  . albuterol (PROVENTIL) (2.5 MG/3ML) 0.083% nebulizer solution Take 3 mLs (2.5 mg total) by nebulization every 6 (six) hours as needed for wheezing or shortness of breath. 75 mL 12  . aspirin 81 MG tablet Take 1 tablet (81 mg total) by mouth daily. 90 tablet 5  . budesonide-formoterol (SYMBICORT) 80-4.5 MCG/ACT inhaler Inhale 2 puffs into the lungs 2 (two) times daily. 1 Inhaler 5  . Calcium Carbonate-Vitamin D (CALTRATE 600+D) 600-400 MG-UNIT tablet Take 1 tablet by mouth daily. (Patient taking differently: Take 1 tablet by mouth every evening. ) 90 tablet 5  . docusate sodium (COLACE) 100 MG capsule Take 1 capsule (100 mg total) by mouth daily. 30 capsule 0  . febuxostat (ULORIC) 40 MG tablet Take 1 tablet (40 mg total) by mouth daily. 90 tablet 5  . ferrous sulfate 325 (65 FE) MG tablet Take 1 tablet (325 mg total) by mouth daily with breakfast. 90 tablet 0  . furosemide (LASIX) 40 MG tablet Take 1 tablet (40 mg total) by mouth 2 (two) times daily. 180 tablet 5  . potassium chloride (K-DUR) 10 MEQ tablet Take 1 tablet (10 mEq total) by mouth daily. 30 tablet 5  . predniSONE (DELTASONE) 10 MG tablet Take 10 mg by mouth daily with breakfast.     No current facility-administered medications for this visit.     REVIEW OF SYSTEMS:    10 Point review of Systems was done is negative except as noted above.  PHYSICAL EXAMINATION: ECOG PERFORMANCE STATUS: 2-3  . Vitals:   05/29/16 0925  BP: (!) 144/45  Pulse: 74  Resp: 20   Filed Weights   05/29/16 0925  Weight: (!) 357 lb 14.4 oz (162.3 kg)   .Body mass index is 57.77 kg/m.   . Wt Readings from Last 3 Encounters:  05/29/16 (!) 357 lb 14.4 oz (162.3 kg)  04/28/16 (!) 326 lb (147.9 kg)  04/03/16 (!) 379 lb (171.9  kg)   GENERAL:alert,  morbidly obese female in no acute distress and comfortable SKIN: skin color, texture, turgor are normal, no rashes or significant lesions EYES: normal, conjunctiva are pink and non-injected, sclera clear OROPHARYNX:no exudate, no erythema and lips, buccal mucosa, and tongue normal  NECK: supple, no JVD, thyroid normal size, non-tender, without nodularity LYMPH:  no palpable lymphadenopathy in the cervical, axillary or inguinal LUNG: Distant breath sounds, a few basal rales, no rhonchi. HEART: regular rate & rhythm,  no murmurs and no lower extremity edema ABDOMEN: abdomen soft, non-tender, normoactive bowel sounds  Musculoskeletal: no cyanosis of digits and no clubbing  PSYCH: alert & oriented x 3 with fluent speech NEURO: no focal motor/sensory deficits  LABORATORY DATA:  I have reviewed the data as listed  . CBC Latest Ref Rng & Units 05/26/2016 04/08/2016 04/03/2016  WBC 3.9 - 10.3 10e3/uL 6.9 7.0 7.1  Hemoglobin 11.6 - 15.9 g/dL 11.6 12.2 11.4(L)  Hematocrit 34.8 - 46.6 % 37.3 39.0 37.3  Platelets 145 - 400 10e3/uL 156 170 174   . CBC    Component Value Date/Time   WBC 6.9 05/26/2016 0822   WBC 7.0 04/08/2016 0850   RBC 5.25 05/26/2016 0822   RBC 5.42 (H) 04/08/2016 0850   HGB 11.6 05/26/2016 0822   HCT 37.3 05/26/2016 0822   PLT 156 05/26/2016 0822   MCV 71.0 (L) 05/26/2016 0822   MCH 22.1 (L) 05/26/2016 0822   MCH 22.5 (L) 04/08/2016 0850   MCHC 31.1 (L) 05/26/2016 0822   MCHC 31.3 04/08/2016 0850   RDW 14.9 (H) 05/26/2016 0822   LYMPHSABS 1.7 05/26/2016 0822   MONOABS 0.5 05/26/2016 0822   EOSABS 0.7 (H) 05/26/2016 0822   BASOSABS 0.0 05/26/2016 0822     . CMP Latest Ref Rng & Units 05/26/2016 04/08/2016 02/21/2016  Glucose 70 - 140 mg/dl 93 113(H) 80  BUN 7.0 - 26.0 mg/dL 26.7(H) 26(H) 29(H)  Creatinine 0.6 - 1.1 mg/dL 1.4(H) 1.70(H) 1.58(H)  Sodium 136 - 145 mEq/L 144 142 141  Potassium 3.5 - 5.1 mEq/L 3.8 3.4(L) 3.8  Chloride 101 - 111  mmol/L - 100(L) 101  CO2 22 - 29 mEq/L 34(H) 33(H) 29  Calcium 8.4 - 10.4 mg/dL 9.1 8.8(L) 8.6  Total Protein 6.4 - 8.3 g/dL 6.7 7.6 -  Total Bilirubin 0.20 - 1.20 mg/dL 0.44 0.5 -  Alkaline Phos 40 - 150 U/L 174(H) 155(H) -  AST 5 - 34 U/L 18 43(H) -  ALT 0 - 55 U/L 17 27 -     Lab Results  Component Value Date   FERRITIN 266 05/26/2016     RADIOGRAPHIC STUDIES: I have personally reviewed the radiological images as listed and agreed with the findings in the report. Ct Chest Wo Contrast  Result Date: 05/26/2016 CLINICAL DATA:  Followup lung cancer. EXAM: CT CHEST WITHOUT CONTRAST TECHNIQUE: Multidetector CT imaging of the chest was performed following the standard protocol without IV contrast. COMPARISON:  11/29/2013 FINDINGS: Cardiovascular: Normal heart size. No pericardial effusion. Mild aortic atherosclerosis. Mediastinum/Nodes: The trachea appears patent and is midline. Unremarkable appearance of the esophagus. No mediastinal or hilar adenopathy. Lungs/Pleura: There is no pleural fluid identified. Focal area of fibrosis and scarring within the basilar portion of the right upper lobe is identified and appears similar to previous exam, image 56 of series 7. Perifissural nodule within the right middle lobe is unchanged 4 mm, image 57 of series 5. No suspicious pulmonary nodule or mass identified. Upper Abdomen: Stable low-attenuation structure within the posterior right lobe of liver measuring 7 mm, image 114 of series 2. No acute findings identified within the upper abdomen. Musculoskeletal: Right ventral chest wall mass measures 7.7 x 3.5 cm, image 65 of series 2. On the previous exam this measured 8.4 x 3.5 cm. No aggressive lytic or sclerotic bone lesions identified. IMPRESSION: 1. Stable CT of the chest. No evidence of local lung cancer recurrence or metastatic disease. 2. Medial right ventral chest wall mass is stable when compared with previous exam. Electronically Signed   By: Kerby Moors M.D.   On: 05/26/2016 12:49   ASSESSMENT &  PLAN:   65 year old African-American female with   #1 history of right upper lobe T1b, N0, M0 primary lung adenocarcinoma most in December 2012. She was deemed not to be a surgical candidate. Treated by radiation oncology with SBRT at [facility]. Completed treatment in March 2013 and has been monitored since with no evidence of recurrence. no evidence of recurrence based on PET/CT scan done on 05/20/2015. On clinical visit today the patient has no new change in her breathing and no new focal symptoms.  Hemoglobin is stable.   CT chest 05/26/2016 shows no evidence of local recurrence of cancer or metastatic disease. Plan -No overt clinical symptoms suggestive of recurrent lung cancer or imaging evidence of this. -We will plan to follow-up the patient in 6 months for an H&P and repeat labs and repeat a CT chest in 12 months unless any new symptoms arise.  #2 Right anterior chest wall slowly growing masslike lesion in the right pectoralis minor muscle. Unclear etiology. FNA showed skeletal muscles, macrophages and spindle cells some with cytologic atypia. Needle core biopsy showed soft tissue and skeletal muscle with extensive necrosis; rare spindle cells with cytologic atypia. Treatment effect causing reactive atypia cannot be ruled out. Clinical correlation is advised to ensure the sample is representative. PET/CT scan done here on 05/20/2015 shows no change in metabolic activity or size of the medial right chest wall mass suggesting benign etiology. Could certainly be related to her sarcoidosis . Previously discussed with the patient the option of biopsying the mass for a more definitive diagnosis versus monitoring it. She chooses to monitor it at this time given that imaging findings suggest a likely benign etiology that hasnt changed over the last 18 months. CT chest 05/26/2016 shows that this lesion is stable compared to previous  imaging.  #3 history of extensive sarcoidosis involving lymphadenopathy in the chest, abdomen as well as spleen and possibly liver .previously treated with steroids by Dr. Bartholome Bill, pulmonary at [facility] .has recently established care with Dr. Melvyn Novas for her pulmonary and sarcoidosis management.   Plan -Continue followup with Dr.[name] For further management  #5 history of sickle cell trait. Hemoglobin electrophoresis is not available. Hgb Improved from 9.9 to 11.3. Microcytosis likely from chronic disease from her sarcoidosis vs possible alpha thal trait. Ferritin levels WNL Plan  -No indication for additional iron replacement at this time.  Return to care in 6 months with Dr. Irene Limbo for continued follow-up .earlier if any acute new concerns . Continue follow-up with primary care physician and pulmonary as per their recommendations   All of the patients questions were answered  to her apparent satisfaction. The patient knows to call the clinic with any problems, questions or concerns.  I spent 20 minutes counseling the patient face to face. The total time spent in the appointment was 25 minutes and more than 50% was on counseling and direct patient cares.    Sullivan Lone MD [place] AAHIVMS [facility] Hematology/Oncology Physician [facility]  (Office):       [phone] (Work cell):  [phone] (Fax):           [phone]

## 2016-06-01 ENCOUNTER — Encounter: Payer: Self-pay | Admitting: Internal Medicine

## 2016-06-01 ENCOUNTER — Ambulatory Visit (INDEPENDENT_AMBULATORY_CARE_PROVIDER_SITE_OTHER): Payer: Medicare Other | Admitting: Internal Medicine

## 2016-06-01 VITALS — BP 126/82 | HR 79 | Ht 66.0 in | Wt 358.0 lb

## 2016-06-01 DIAGNOSIS — J9611 Chronic respiratory failure with hypoxia: Secondary | ICD-10-CM | POA: Diagnosis not present

## 2016-06-01 DIAGNOSIS — J4550 Severe persistent asthma, uncomplicated: Secondary | ICD-10-CM | POA: Diagnosis not present

## 2016-06-01 DIAGNOSIS — J9612 Chronic respiratory failure with hypercapnia: Secondary | ICD-10-CM | POA: Diagnosis not present

## 2016-06-01 LAB — NITRIC OXIDE: NITRIC OXIDE: 85

## 2016-06-01 MED ORDER — BUDESONIDE-FORMOTEROL FUMARATE 160-4.5 MCG/ACT IN AERO: 2.0000 | INHALATION_SPRAY | Freq: Two times a day (BID) | RESPIRATORY_TRACT | 6 refills | Status: DC

## 2016-06-01 NOTE — Progress Notes (Signed)
Subjective:    Patient ID: Pamela [name], female    DOB: [birth year]  MRN: [id number]   Brief patient profile:  77 yobf never smoker with 1st asthma attack in 1990s and on maint rx since late 90's maint rx and freq courses of prednisone 3-4 x per year despite advair and spiriva and freq saba referred to pulmonary clinic 04/27/2014 by Dr [name] s/p CT Bx 06/25/11 > no path report but per oncology = [id number] adenoca of lung > RT only per pt RUL completed 09/2011     History of Present Illness  04/27/2014 1st [name] office visit/ Pamela [name]   Chief Complaint  Patient presents with  . Pulmonary Consult    Referred by Dr. [name]. [name]  Pt recently moved to [name] from [name] and states needs to establish with new pulmonary MD.  She c/o DOE and cough, and states that she feels these symptoms are currently under control.    on prednisone 10 mg daily  since late July 2015 and still on freq saba and 2lpm  Already used   2puffs proair am of ov  rec Stop spiriva and advair Start dulera 100 Take 2 puffs first thing in am and then another 2 puffs about 12 hours later.  Work on inhaler technique:  relax and gently blow all the way out then take a nice smooth deep breath back in, triggering the inhaler at same time you start breathing in.  Hold for up to 5 seconds if you can.  Rinse and gargle with water when done Only use your albuterol (proair) as a rescue medication  If proair not helping, then use the neb and if needing the neb more than occastional, then take prednisone 10 mg daily  Please schedule a follow up office visit in 4 weeks, sooner if needed with all inhalers in hand for pfts on return   11/30/2014 f/u ov/Pamela [name] re: mild intermittent asthma ? Related to weather / still on acei plus on prednisone 10 mg daily for gout  Chief Complaint  Patient presents with  . Acute Visit    Pt c/o increased SOB for the past month. She is  also coughing and wheezing. Cough is prod with minimal yellow to clear sputum. She uses proair 2 x daily on average.   Also due for CT chest to f/u dx of lung ca dx March 2013  never surgery/ RT only  Onset of dry cough / wheeze was insidious/ pattern is progressively worse day >> noct with increasing  need for saba but very poor hfa  rec Stop lisinopril and start valsartan 80 mg one daily instead  Only use your albuterol as a rescue medication   Restart dulera 100 Take 2 puffs first thing in am and then another 2 puffs about 12 hours later until you only need  your rescue inhaler no more than twice weekly for cough/wheeze/ short of breath  Follow up with Dr [name] as planned and only see me if needed    06/06/2015  f/u ov/Pamela [name] re: worse since finished pred ? One week prior to [name]  / was taking for gout  Chief Complaint  Patient presents with  . Acute Visit    Increased SOB with or without exertion for the past 4 days. She also c/o cough and wheezing. Cough is prod with clear sputum.     Was doing much better while on prednisone  and gradually worse since off  Last used proventil about 7 am prior to OV  At 1130  On symbicort but hfa quite poor > see a/p Has neb but no meds for it  rec Prednisone Take 4 for three days 3 for three days 2 for three days 1 for three days and stop Work on inhaler technique:  relax and gently blow all the way out then take a nice smooth deep breath back in, triggering the inhaler at same time you start breathing in.  Hold for up to 5 seconds if you can. Blow out thru nose. Rinse and gargle with water when done Plan A = Automatic = symbicort 80 Take 2 puffs first thing in am and then another 2 puffs about 12 hours later.  Plan B = backup  Only use your albuterol(proair)   Plan C = crisis - only use nebulizer albuterol if you try the proair and it doesn't work       07/02/2015  f/u ov/Pamela [name] re: asthma vs uacs on pred 10 for gout and symb 80 2bid  and no saba    Chief Complaint  Patient presents with  . Follow-up    Breathing is much improved. She has not had to use rescue inhaler or neb.   Work on inhaler technique:  If start needing more albuterol for any reason  in any form return here asap    04/28/2016 NP  ER Follow up : Asthma /Sarcoid /Lung Cancer hx  Pt returns for ER follow up . Seen in ER on 04/08/16 for cough and dyspnea for 2 weeks.  She was treated with nebs and IV steroids. Discharged on prednisone 40mg  daily for 4 d.  BNP and troponin were nml. CXR without acute process.  She remains on Symbicort.. Rate use of albuterol.  Previous PFT 2015 with FEV1 93%, ratio 85, +BD response.  Is working on weight loss. Is down 40lbs since last ov.  Struggles with fluid retention, on Lasix 40mg  Twice daily  .  Has chronic LE swelling. She has CKD followed by [name].  Remains on o2 at 2l/m .  Split night sleep study in 07/2014 showed mild OSA only with AHI 6.9. O2 sats nml on 2l/m .  Over last 2 weeks has had to use albuterol 1 x day.  Since ER she is some better but not back to baseline. Still has intermittent wheezing but decreased .  No chest pain, orthopnea, edema or fever.  Flu shot is utd.  FENO today 04/28/2016  is 70  Moved here in 2015 From [name].  Dx with lung cancer 2012, tx with radiation 2013. At [name].  Now sees Dr. Irene Limbo with Cancer center . Has CT scan due 05/26/16 . No evidence of recurrence based on PET/CT scan done on 05/20/2015. She is a never smoker but has second hand smoke exposure She also carries dx of Sarcoid from previous bx. (followed previously by Dr. Bartholome Bill at [name], with lymphadenopathy in chest , abd w/ possible spleen and liver involvement.  She has right anterior chest wall slowly growing masslike lesion in the right pectoralis minor muscle. Unclear etiology with FNA. Followed on CT chest . She is on prednisone 10mg  daily for gout never less  rec Continue  on Symbicort 2 puffs Twice daily   Add Zyrtec 10mg  At bedtime  As needed  Drainage.  Add Pepcid 20mg  At bedtime  .  Increase prednisone 20mg  daily for  1 week then back to '10mg'$ .     06/01/2016  f/u ov/Pamela [name] re: chronic asthma/sarcoid/ symb 80 2bid/ saba hfa and neb and pred back to 10 mg daily (floor)  Chief Complaint  Patient presents with  . Follow-up    Breathing has improved some since the last visit. She still wheezes occ wheezing. She has occ cough with min yellow sputum.  She is using albuterol inhaler 2 x per wk on average and she uses neb 3 x per wk on average.   Doe x room to room/ sleeping ok on 2lpm with hob elevation 30 degrees   No obvious day to day or daytime variability or assoc mucus plugs or hemoptysis or cp or chest tightness, subjective wheeze or overt sinus or hb symptoms. No unusual exp hx or h/o childhood pna/ asthma or knowledge of premature birth.  Sleeping ok without nocturnal  or early am exacerbation  of respiratory  c/o's or need for noct saba. Also denies any obvious fluctuation of symptoms with weather or environmental changes or other aggravating or alleviating factors except as outlined above   Current Medications, Allergies, Complete Past Medical History, Past Surgical History, Family History, and Social History were reviewed in [employer] record.  ROS  The following are not active complaints unless bolded sore throat, dysphagia, dental problems, itching, sneezing,  nasal congestion or excess/ purulent secretions, ear ache,   fever, chills, sweats, unintended wt loss, classically pleuritic or exertional cp,  orthopnea pnd or leg swelling, presyncope, palpitations, abdominal pain, anorexia, nausea, vomiting, diarrhea  or change in bowel or bladder habits, change in stools or urine, dysuria,hematuria,  rash, arthralgias, visual complaints, headache, numbness, weakness or ataxia or problems with walking or coordination,  change in  mood/affect or memory.                    Objective:   Physical Exam  amb massively obese  Bf     11/30/2014  343 > 06/06/2015  355 > 07/03/2015  360 > 06/01/2016  358    Vital signs reviewed - Note on arrival 02 sats  97% on 2lpm       HEENT: nl urbinates, and orophanx. Nl external ear canals without cough reflex - dentures in place    NECK :  without JVD/Nodes/TM/ nl carotid upstrokes bilaterally   LUNGS: no acc muscle use, few insp and exp wheezes   CV:  RRR  no s3 or murmur or increase in P2  , 1-2 + edema, chronic venous insufficiency sym bilaterally    ABD:  Massively obese/ soft and nontender with limited excursion.    MS:  warm without deformities, calf tenderness, cyanosis or clubbing - walks very slow gait/ with cane  SKIN: warm and dry without lesions             I personally reviewed images and agree with radiology impression as follows:  CT w/o contrast  Chest     05/26/16 1. Stable CT of the chest. No evidence of local lung cancer recurrence or metastatic disease. 2. Medial right ventral chest wall mass is stable when compared with previous exam.

## 2016-06-01 NOTE — Assessment & Plan Note (Signed)
Body mass index is 57.78   Lab Results  Component Value Date   TSH 3.934 05/21/2014     Contributing to gerd tendency/ doe/reviewed the need and the process to achieve and maintain neg calorie balance > defer f/u primary care including intermittently monitoring thyroid status

## 2016-06-01 NOTE — Assessment & Plan Note (Signed)
pfts 07/31/13  FEV1  1.53 (58%) with ratio 66 > p saba ratio 74 FEV1 1.68 (64%)  - -04/27/2014 p extensive coaching HFA effectiveness =    75% > try dulera 100 2 bid  - PFT's 07/19/2014 FEV1 2.00 (93%) and ratio 85 after 21% improvement from saba with no inhalers x one week  - trial off acei 12/01/2014 due to pseudoasthma component > resolved  - Spirometry 06/01/2016  FEV1 1.06 (48%)  Ratio 61  - FENO 06/01/2016  =   85 on symbicort 80 x2 > increase to 160  - Prednisone 10 mg floor x years, increased to 20 mg if needing neb    Clearly not well controlled by hx/ exam/ feno/spirometry   DDX of  difficult airways management almost all start with A and  include Adherence, Ace Inhibitors, Acid Reflux, Active Sinus Disease, Alpha 1 Antitripsin deficiency, Anxiety masquerading as Airways dz,  ABPA,  Allergy(esp in young), Aspiration (esp in elderly), Adverse effects of meds,  Active smokers, A bunch of PE's (a small clot burden can't cause this syndrome unless there is already severe underlying pulm or vascular dz with poor reserve) plus two Bs  = Bronchiectasis and Beta blocker use..and one C= CHF  Adherence is always the initial "prime suspect" and is a multilayered concern that requires a "trust but verify" approach in every patient - starting with knowing how to use medications, especially inhalers, correctly, keeping up with refills and understanding the fundamental difference between maintenance and prns vs those medications only taken for a very short course and then stopped and not refilled.  - - The proper method of use, as well as anticipated side effects, of a metered-dose inhaler are discussed and demonstrated to the patient. Improved effectiveness after extensive coaching during this visit to a level of approximately 75 % from a baseline of 50 % try symbicort up to 160 2bid  ? Allergy > prednisone responsive / dep for gout, ok to use 20 mg per day until better then 10 mg daily    ? ? chf > rx  diuretics per renal    I had an extended discussion with the patient reviewing all relevant studies completed to date and  lasting 15 to 20 minutes of a 25 minute visit on the following ongoing concerns:    To keep things simple, I have asked the patient to first separate medicines that are perceived as maintenance, that is to be taken daily "no matter what", from those medicines that are taken on only on an as-needed basis and I have given the patient examples of both, and then return to see our NP to generate a  detailed  medication calendar which should be followed until the next physician sees the patient and updates it.    Each maintenance medication was reviewed in detail including most importantly the difference between maintenance and as needed and under what circumstances the prns are to be used.  Please see instructions for details which were reviewed in writing and the patient given a copy.

## 2016-06-01 NOTE — Assessment & Plan Note (Signed)
Started 2lpm 24/7 in [place] around 2013  See pfts 07/19/2014 with completely reversible airflow obst (so this is not copd) and a dlco of 71% so this is not ILD - HC03  34 05/26/16

## 2016-06-01 NOTE — Patient Instructions (Addendum)
Plan A = Automatic = increase Symbicort to 160 Take 2 puffs first thing in am and then another 2 puffs about 12 hours later. (ok to use up your symbicort 80 if you run out of the 160 before next visit)    Work on inhaler technique:  relax and gently blow all the way out then take a nice smooth deep breath back in, triggering the inhaler at same time you start breathing in.  Hold for up to 5 seconds if you can. Blow out thru nose. Rinse and gargle with water when done     Plan B = Backup Only use your albuterol as a rescue medication to be used if you can't catch your breath by resting or doing a relaxed purse lip breathing pattern.  - The less you use it, the better it will work when you need it. - Ok to use the inhaler up to 2 puffs  every 4 hours if you must but call for appointment if use goes up over your usual need - Don't leave home without it !!  (think of it like the spare tire for your car)    Plan C = Crisis - only use your albuterol nebulizer if you first try Plan B and it fails to help > ok to use the nebulizer up to every 4 hours but if start needing it more than once a day increase Prednisone to 20 mg daily until you are better then 10 mg daily    See [name] NP w/in 2 weeks or next available after that with all your medications, even over the counter meds, separated in two separate bags, the ones you take no matter what vs the ones you stop once you feel better and take only as needed when you feel you need them.   [name]  will generate for you a new user friendly medication calendar that will put [place] all on the same page re: your medication use.     Without this process, it simply isn't possible to assure that we are providing  your outpatient care  with  the attention to detail we feel you deserve.   If we cannot assure that you're getting that kind of care,  then we cannot manage your problem effectively from this clinic.  Once you have seen [name] and we are sure that we're all on  the same page with your medication use she will arrange follow up with me.

## 2016-06-16 ENCOUNTER — Encounter: Payer: Self-pay | Admitting: Adult Health

## 2016-06-16 ENCOUNTER — Ambulatory Visit (INDEPENDENT_AMBULATORY_CARE_PROVIDER_SITE_OTHER): Payer: Medicare Other | Admitting: Adult Health

## 2016-06-16 DIAGNOSIS — D869 Sarcoidosis, unspecified: Secondary | ICD-10-CM

## 2016-06-16 DIAGNOSIS — J4540 Moderate persistent asthma, uncomplicated: Secondary | ICD-10-CM | POA: Diagnosis not present

## 2016-06-16 DIAGNOSIS — J9612 Chronic respiratory failure with hypercapnia: Secondary | ICD-10-CM

## 2016-06-16 DIAGNOSIS — Z85118 Personal history of other malignant neoplasm of bronchus and lung: Secondary | ICD-10-CM

## 2016-06-16 DIAGNOSIS — J9611 Chronic respiratory failure with hypoxia: Secondary | ICD-10-CM

## 2016-06-16 NOTE — Assessment & Plan Note (Signed)
Recent flare now resolved  Patient's medications were reviewed today and patient education was given. Computerized medication calendar was adjusted/completed   Plan  Patient Instructions  Follow med calendar closely and bring to each visit.  Continue on current regimen .  follow up Dr. Melvyn Novas  In 4 months and As needed

## 2016-06-16 NOTE — Progress Notes (Signed)
Chart and office note reviewed in detail  > agree with a/p as outlined    

## 2016-06-16 NOTE — Assessment & Plan Note (Signed)
Cont on O2 .  

## 2016-06-16 NOTE — Patient Instructions (Signed)
Follow med calendar closely and bring to each visit.  Continue on current regimen .  follow up Dr. Melvyn Novas  In 4 months and As needed

## 2016-06-16 NOTE — Assessment & Plan Note (Signed)
Cont to follow up with Onocology  Most recent CTchest 05/2016 w/ no evidence of dz recurrence

## 2016-06-16 NOTE — Progress Notes (Signed)
Subjective:    Patient ID: Pamela [name], female    DOB: [birth year]  MRN: [id number]   Brief patient profile:  77 yobf never smoker with 1st asthma attack in 1990s and on maint rx since late 90's maint rx and freq courses of prednisone 3-4 x per year despite advair and spiriva and freq saba referred to pulmonary clinic 04/27/2014 by Dr [name] s/p CT Bx 06/25/11 > no path report but per oncology = [id number] adenoca of lung > RT only per pt RUL completed 09/2011     History of Present Illness  04/27/2014 1st [name] office visit/ [name]   Chief Complaint  Patient presents with  . Pulmonary Consult    Referred by Dr. [name]. [name]  Pt recently moved to [name] from [name] and states needs to establish with new pulmonary MD.  She c/o DOE and cough, and states that she feels these symptoms are currently under control.    on prednisone 10 mg daily  since late July 2015 and still on freq saba and 2lpm  Already used   2puffs proair am of ov  rec Stop spiriva and advair Start dulera 100 Take 2 puffs first thing in am and then another 2 puffs about 12 hours later.  Work on inhaler technique:  relax and gently blow all the way out then take a nice smooth deep breath back in, triggering the inhaler at same time you start breathing in.  Hold for up to 5 seconds if you can.  Rinse and gargle with water when done Only use your albuterol (proair) as a rescue medication  If proair not helping, then use the neb and if needing the neb more than occastional, then take prednisone 10 mg daily  Please schedule a follow up office visit in 4 weeks, sooner if needed with all inhalers in hand for pfts on return   11/30/2014 f/u ov/[name] re: mild intermittent asthma ? Related to weather / still on acei plus on prednisone 10 mg daily for gout  Chief Complaint  Patient presents with  . Acute Visit    Pt c/o increased SOB for the past month. She is  also coughing and wheezing. Cough is prod with minimal yellow to clear sputum. She uses proair 2 x daily on average.   Also due for CT chest to f/u dx of lung ca dx March 2013  never surgery/ RT only  Onset of dry cough / wheeze was insidious/ pattern is progressively worse day >> noct with increasing  need for saba but very poor hfa  rec Stop lisinopril and start valsartan 80 mg one daily instead  Only use your albuterol as a rescue medication   Restart dulera 100 Take 2 puffs first thing in am and then another 2 puffs about 12 hours later until you only need  your rescue inhaler no more than twice weekly for cough/wheeze/ short of breath  Follow up with Dr [name] as planned and only see me if needed    06/06/2015  f/u ov/[name] re: worse since finished pred ? One week prior to [name]  / was taking for gout  Chief Complaint  Patient presents with  . Acute Visit    Increased SOB with or without exertion for the past 4 days. She also c/o cough and wheezing. Cough is prod with clear sputum.     Was doing much better while on prednisone  and gradually worse since off  Last used proventil about 7 am prior to OV  At 1130  On symbicort but hfa quite poor > see a/p Has neb but no meds for it  rec Prednisone Take 4 for three days 3 for three days 2 for three days 1 for three days and stop Work on inhaler technique:  relax and gently blow all the way out then take a nice smooth deep breath back in, triggering the inhaler at same time you start breathing in.  Hold for up to 5 seconds if you can. Blow out thru nose. Rinse and gargle with water when done Plan A = Automatic = symbicort 80 Take 2 puffs first thing in am and then another 2 puffs about 12 hours later.  Plan B = backup  Only use your albuterol(proair)   Plan C = crisis - only use nebulizer albuterol if you try the proair and it doesn't work       07/02/2015  f/u ov/[name] re: asthma vs uacs on pred 10 for gout and symb 80 2bid  and no saba    Chief Complaint  Patient presents with  . Follow-up    Breathing is much improved. She has not had to use rescue inhaler or neb.   Work on inhaler technique:  If start needing more albuterol for any reason  in any form return here asap    04/28/2016 NP  ER Follow up : Asthma /Sarcoid /Lung Cancer hx  Pt returns for ER follow up . Seen in ER on 04/08/16 for cough and dyspnea for 2 weeks.  She was treated with nebs and IV steroids. Discharged on prednisone 40mg  daily for 4 d.  BNP and troponin were nml. CXR without acute process.  She remains on Symbicort.. Rate use of albuterol.  Previous PFT 2015 with FEV1 93%, ratio 85, +BD response.  Is working on weight loss. Is down 40lbs since last ov.  Struggles with fluid retention, on Lasix 40mg  Twice daily  .  Has chronic LE swelling. She has CKD followed by [name].  Remains on o2 at 2l/m .  Split night sleep study in 07/2014 showed mild OSA only with AHI 6.9. O2 sats nml on 2l/m .  Over last 2 weeks has had to use albuterol 1 x day.  Since ER she is some better but not back to baseline. Still has intermittent wheezing but decreased .  No chest pain, orthopnea, edema or fever.  Flu shot is utd.  FENO today 04/28/2016  is 70  Moved here in 2015 From [name].  Dx with lung cancer 2012, tx with radiation 2013. At [name].  Now sees Dr. Irene Limbo with Cancer center . Has CT scan due 05/26/16 . No evidence of recurrence based on PET/CT scan done on 05/20/2015. She is a never smoker but has second hand smoke exposure She also carries dx of Sarcoid from previous bx. (followed previously by Dr. Bartholome Bill at [name], with lymphadenopathy in chest , abd w/ possible spleen and liver involvement.  She has right anterior chest wall slowly growing masslike lesion in the right pectoralis minor muscle. Unclear etiology with FNA. Followed on CT chest . She is on prednisone 10mg  daily for gout never less  rec Continue  on Symbicort 2 puffs Twice daily   Add Zyrtec 10mg  At bedtime  As needed  Drainage.  Add Pepcid 20mg  At bedtime  .  Increase prednisone 20mg  daily for  1 week then back to '10mg'$ .     06/01/2016  f/u ov/[name] re: chronic asthma/sarcoid/ symb 80 2bid/ saba hfa and neb and pred back to 10 mg daily (floor)  Chief Complaint  Patient presents with  . Follow-up    Breathing has improved some since the last visit. She still wheezes occ wheezing. She has occ cough with min yellow sputum.  She is using albuterol inhaler 2 x per wk on average and she uses neb 3 x per wk on average.   Doe x room to room/ sleeping ok on 2lpm with hob elevation 30 degrees >>Increase Symbicort 160  06/16/2016 Follow up : Chronic asthma /Sarcoid / Lung Cancer (2012-s/p SBRT) /Right anterior chest wall masslike lesion  Pt returns for 2 week follow up , she was having a slow to resolve asthma/sarcoid  flare , tx w/ short steroid burst and Symbicort dose increase to 160. She returns today feeling better. Breathing has returned to her normal . No flare of cough or wheezing .  She denies chest pain, orthopnea, edema or fever.   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 She does have a slowly growing masslike lesion in right anterior chest wall w/ unclear etiology . Bx done 2015 nondiagnostic. PET/CT chest 04/2015 shows no change in metabolic act or size suggestive of benign etiology .    We reviewed all her meds and organized them into a med calendar with pt education. Appears to be taking correctly   Current Medications, Allergies, Complete Past Medical History, Past Surgical History, Family History, and Social History were reviewed in [employer] record.  ROS  The following are not active complaints unless bolded sore throat, dysphagia, dental problems, itching, sneezing,  nasal  congestion or excess/ purulent secretions, ear ache,   fever, chills, sweats, unintended wt loss, classically pleuritic or exertional cp,  orthopnea pnd or leg swelling, presyncope, palpitations, abdominal pain, anorexia, nausea, vomiting, diarrhea  or change in bowel or bladder habits, change in stools or urine, dysuria,hematuria,  rash, arthralgias, visual complaints, headache, numbness, weakness or ataxia or problems with walking or coordination,  change in mood/affect or memory.                    Objective:   Physical Exam  amb massively obese  Bf     11/30/2014  343 > 06/06/2015  355 > 07/03/2015  360 > 06/01/2016  358    Vital signs reviewed - Note on arrival 02 sats  100% on 2lpm   Vitals:   06/16/16 0932  BP: 122/70  Pulse: 65  Temp: 98.2 F (36.8 C)  TempSrc: Oral  SpO2: 100%  Weight: (!) 372 lb 9.6 oz (169 kg)  Height: '5\' 6"'$  (1.676 m)        HEENT: nl urbinates, and orophanx. Nl external ear canals without cough reflex - dentures in place    NECK :  without JVD/Nodes/TM/ nl carotid upstrokes bilaterally   LUNGS: no acc muscle use, CTA w/ no wheezing   CV:  RRR  no s3 or murmur or increase in P2  , 1-2 + edema, chronic venous insufficiency sym bilaterally    ABD:  Massively obese/ soft and nontender with limited excursion.    MS:  warm without deformities, calf tenderness, cyanosis or clubbing - walks very slow gait/ with cane  SKIN:  warm and dry without lesions            CT w/o contrast  Chest     05/26/16 1. Stable CT of the chest. No evidence of local lung cancer recurrence or metastatic disease. 2. Medial right ventral chest wall mass is stable when compared with previous exam.    Alphonza Tramell NP-C  [facility]  06/16/2016

## 2016-06-16 NOTE — Assessment & Plan Note (Signed)
Appears to be stable  Cont to monitor

## 2016-06-24 ENCOUNTER — Other Ambulatory Visit: Payer: Self-pay | Admitting: Family Medicine

## 2016-06-24 ENCOUNTER — Telehealth: Payer: Self-pay

## 2016-06-24 ENCOUNTER — Other Ambulatory Visit: Payer: Self-pay

## 2016-06-24 DIAGNOSIS — E876 Hypokalemia: Secondary | ICD-10-CM

## 2016-06-24 MED ORDER — POTASSIUM CHLORIDE ER 10 MEQ PO TBCR: 10.0000 meq | EXTENDED_RELEASE_TABLET | Freq: Every day | ORAL | 1 refills | Status: DC

## 2016-06-24 NOTE — Telephone Encounter (Signed)
Refill for potassium sent into pharmacy. Thanks!

## 2016-07-03 DIAGNOSIS — N2581 Secondary hyperparathyroidism of renal origin: Secondary | ICD-10-CM | POA: Diagnosis not present

## 2016-07-03 DIAGNOSIS — N189 Chronic kidney disease, unspecified: Secondary | ICD-10-CM | POA: Diagnosis not present

## 2016-07-03 DIAGNOSIS — R609 Edema, unspecified: Secondary | ICD-10-CM | POA: Diagnosis not present

## 2016-07-03 DIAGNOSIS — M109 Gout, unspecified: Secondary | ICD-10-CM | POA: Diagnosis not present

## 2016-07-03 DIAGNOSIS — N183 Chronic kidney disease, stage 3 (moderate): Secondary | ICD-10-CM | POA: Diagnosis not present

## 2016-07-03 DIAGNOSIS — J45909 Unspecified asthma, uncomplicated: Secondary | ICD-10-CM | POA: Diagnosis not present

## 2016-07-03 DIAGNOSIS — J449 Chronic obstructive pulmonary disease, unspecified: Secondary | ICD-10-CM | POA: Diagnosis not present

## 2016-07-03 DIAGNOSIS — C3490 Malignant neoplasm of unspecified part of unspecified bronchus or lung: Secondary | ICD-10-CM | POA: Diagnosis not present

## 2016-07-03 DIAGNOSIS — D631 Anemia in chronic kidney disease: Secondary | ICD-10-CM | POA: Diagnosis not present

## 2016-07-03 DIAGNOSIS — I509 Heart failure, unspecified: Secondary | ICD-10-CM | POA: Diagnosis not present

## 2016-07-03 DIAGNOSIS — E8881 Metabolic syndrome: Secondary | ICD-10-CM | POA: Diagnosis not present

## 2016-07-03 DIAGNOSIS — D869 Sarcoidosis, unspecified: Secondary | ICD-10-CM | POA: Diagnosis not present

## 2016-07-03 DIAGNOSIS — I129 Hypertensive chronic kidney disease with stage 1 through stage 4 chronic kidney disease, or unspecified chronic kidney disease: Secondary | ICD-10-CM | POA: Diagnosis not present

## 2016-07-28 ENCOUNTER — Ambulatory Visit: Payer: Medicare Other | Admitting: Family Medicine

## 2016-07-30 ENCOUNTER — Ambulatory Visit: Payer: Medicare Other | Admitting: Internal Medicine

## 2016-08-04 ENCOUNTER — Ambulatory Visit (INDEPENDENT_AMBULATORY_CARE_PROVIDER_SITE_OTHER): Payer: Medicare Other | Admitting: Family Medicine

## 2016-08-04 ENCOUNTER — Encounter: Payer: Self-pay | Admitting: Family Medicine

## 2016-08-04 VITALS — BP 122/60 | HR 61 | Temp 98.2°F | Resp 18 | Ht 66.0 in | Wt 376.0 lb

## 2016-08-04 DIAGNOSIS — Z1211 Encounter for screening for malignant neoplasm of colon: Secondary | ICD-10-CM

## 2016-08-04 DIAGNOSIS — R7303 Prediabetes: Secondary | ICD-10-CM | POA: Diagnosis not present

## 2016-08-04 DIAGNOSIS — D638 Anemia in other chronic diseases classified elsewhere: Secondary | ICD-10-CM | POA: Diagnosis not present

## 2016-08-04 DIAGNOSIS — B351 Tinea unguium: Secondary | ICD-10-CM | POA: Diagnosis not present

## 2016-08-04 DIAGNOSIS — I10 Essential (primary) hypertension: Secondary | ICD-10-CM | POA: Diagnosis not present

## 2016-08-04 DIAGNOSIS — Z9981 Dependence on supplemental oxygen: Secondary | ICD-10-CM

## 2016-08-04 LAB — CBC WITH DIFFERENTIAL/PLATELET
BASOS PCT: 0 %
Basophils Absolute: 0 cells/uL (ref 0–200)
EOS ABS: 414 {cells}/uL (ref 15–500)
Eosinophils Relative: 9 %
HEMATOCRIT: 35.1 % (ref 35.0–45.0)
HEMOGLOBIN: 10.6 g/dL — AB (ref 11.7–15.5)
LYMPHS ABS: 644 {cells}/uL — AB (ref 850–3900)
Lymphocytes Relative: 14 %
MCH: 21.8 pg — ABNORMAL LOW (ref 27.0–33.0)
MCHC: 30.2 g/dL — AB (ref 32.0–36.0)
MCV: 72.2 fL — ABNORMAL LOW (ref 80.0–100.0)
MONO ABS: 276 {cells}/uL (ref 200–950)
MPV: 10.9 fL (ref 7.5–12.5)
Monocytes Relative: 6 %
NEUTROS PCT: 71 %
Neutro Abs: 3266 cells/uL (ref 1500–7800)
Platelets: 167 10*3/uL (ref 140–400)
RBC: 4.86 MIL/uL (ref 3.80–5.10)
RDW: 15.7 % — ABNORMAL HIGH (ref 11.0–15.0)
WBC: 4.6 10*3/uL (ref 3.8–10.8)

## 2016-08-04 LAB — BASIC METABOLIC PANEL
BUN: 30 mg/dL — AB (ref 7–25)
CALCIUM: 9 mg/dL (ref 8.6–10.4)
CHLORIDE: 102 mmol/L (ref 98–110)
CO2: 31 mmol/L (ref 20–31)
CREATININE: 1.59 mg/dL — AB (ref 0.50–0.99)
GLUCOSE: 96 mg/dL (ref 65–99)
Potassium: 4.3 mmol/L (ref 3.5–5.3)
Sodium: 143 mmol/L (ref 135–146)

## 2016-08-04 LAB — POCT URINALYSIS DIP (DEVICE)
BILIRUBIN URINE: NEGATIVE
Glucose, UA: NEGATIVE mg/dL
HGB URINE DIPSTICK: NEGATIVE
Ketones, ur: NEGATIVE mg/dL
LEUKOCYTES UA: NEGATIVE
Nitrite: NEGATIVE
PH: 7 (ref 5.0–8.0)
Protein, ur: NEGATIVE mg/dL
SPECIFIC GRAVITY, URINE: 1.01 (ref 1.005–1.030)
Urobilinogen, UA: 0.2 mg/dL (ref 0.0–1.0)

## 2016-08-04 LAB — POCT GLYCOSYLATED HEMOGLOBIN (HGB A1C): Hemoglobin A1C: 5.4

## 2016-08-04 NOTE — Progress Notes (Signed)
Subjective:    Patient ID: Pamela [name], female    DOB: 1950-11-20, 66 y.o.   MRN: [id number]  HPI  [name] is a 66 year old female with history of chronic diastolic CHF, gouty arthritis, HTN, ,and sarcodosis that presents for a 3 month follow up of hypertension.  She states that she feels well and is without complaint. She states that she has not been following a lowfat, low sodium diet and has not increased her daily activity. She has been taking all medications consistently. She has recently followed up with Dr. Melvyn Novas, pulmonologist for sarcoidosis and history of asthma. She is evaluated by Dr. Irene Limbo, oncology every three months. Patient denies headache, chest pain, abdominal pain, dysuria, rectal bleeding, nausea, vomiting, diarrhea, or constipation. No new weakness tingling or numbness. She also denies cough, or shortness of breath.   [name] has severe obesity. She has been following a low sodium, carbohydrate modified, low calorie diet consistently over the past several months. She is unable to exercise due to history of sarcoidosis and chronic oxygen use.  She is inquiring about bariatric procedures due to inability to lose weight with continuous interventions.    Past Medical History:  Diagnosis Date  . Arthritis   . Asthma   . Cancer ([place])    lung, adenocarcinoma right lung 2012  . CHF (congestive heart failure) (HCC)    Preserved EF  . Congenital single kidney    With chronic kidney disease  . COPD (chronic obstructive pulmonary disease) ([place])   . Gout   . Hypertension   . Lung cancer [facility]) 2012   Right upper lobe lung adenocarcinoma diagnosed with needle biopsy treated by SBRT finished treatment April 2013 has been monitored since  . Mass of chest wall, right    Right chest wall mass 7.3 cm biopsy on 12/13/2013. Patient notes it was consistent with sarcoidosis but actual pathology results not available.  . Oxygen deficiency   . Sarcoidosis ([place])   . Sickle  cell trait ([place])   . Sleep apnea    Social History   Social History  . Marital status: Divorced    Spouse name: N/A  . Number of children: 2  . Years of education: N/A   Social History Main Topics  . Smoking status: Never Smoker  . Smokeless tobacco: Never Used  . Alcohol use No  . Drug use: No  . Sexual activity: Not Asked   Other Topics Concern  . None   Social History Narrative   Lives with son.  One son is deceased.     Allergies  Allergen Reactions  . Sulfur Rash   Immunization History  Administered Date(s) Administered  . Influenza,inj,Quad PF,36+ Mos 04/13/2014, 04/16/2015, 04/03/2016  . Pneumococcal Conjugate-13 04/03/2016  . Pneumococcal Polysaccharide-23 04/13/2014  . Tdap 01/02/2015  . Zoster 05/21/2014   Review of Systems  Constitutional: Negative.  Negative for fatigue and fever.  HENT: Negative.   Eyes: Negative.  Negative for visual disturbance.  Respiratory: Negative for chest tightness and shortness of breath.   Cardiovascular: Negative.   Gastrointestinal: Negative.   Endocrine: Negative.  Negative for polydipsia, polyphagia and polyuria.  Genitourinary: Negative.   Musculoskeletal: Positive for myalgias.  Skin: Negative.   Allergic/Immunologic: Negative.   Neurological: Negative.  Negative for light-headedness and headaches.  Hematological: Negative.   Psychiatric/Behavioral: Negative.  Negative for suicidal ideas.       Objective:   Physical Exam  Constitutional: She is oriented to person, place,  and time. Vital signs are normal. She appears well-developed and well-nourished. Nasal cannula in place.  Morbid obesity  HENT:  Head: Normocephalic and atraumatic.  Right Ear: Hearing, tympanic membrane, external ear and ear canal normal.  Left Ear: Hearing, tympanic membrane, external ear and ear canal normal.  Mouth/Throat: Uvula is midline and oropharynx is clear and moist. She has dentures.  Eyes: Conjunctivae and EOM are normal. Pupils  are equal, round, and reactive to light. Lids are everted and swept, no foreign bodies found.  Neck: Trachea normal and normal range of motion. Neck supple.  Cardiovascular: Normal rate, regular rhythm, S1 normal, S2 normal, normal heart sounds and intact distal pulses.   Bilateral lower extremity edema  Pulmonary/Chest: Effort normal and breath sounds normal.  Abdominal: Soft. Normal appearance and bowel sounds are normal.  Increased abdominal girth  Neurological: She is alert and oriented to person, place, and time. She has normal reflexes.  Skin: Skin is warm, dry and intact.  Hyperpigmentation to lower extremities Toenail thickening and yellowing   Psychiatric: She has a normal mood and affect. Her speech is normal and behavior is normal. Judgment and thought content normal. Cognition and memory are normal.      BP 122/60 (BP Location: Left Arm, Patient Position: Sitting, Cuff Size: Large)   Pulse 61   Temp 98.2 F (36.8 C) (Oral)   Resp 18   Ht '5\' 6"'$  (1.676 m)   Wt (!) 376 lb (170.6 kg)   SpO2 99% Comment: on 2 L o2  BMI 60.69 kg/m  Assessment & Plan:  1. Essential hypertension Blood pressure is at goal on current medication regimen. No proteinuria present.   2. Prediabetes Recommend a lowfat, low carbohydrate diet divided over 5-6 small meals, increase water intake to 6-8 glasses, and 150 minutes per week of cardiovascular exercise.   - POCT A1C - Ambulatory referral to Podiatry - Basic Metabolic Panel  3. Onychomycosis Toenail yellowing and thickening, will refer to podiatry for further treatment and evaluation.  - Ambulatory referral to Podiatry  4. Screening for colon cancer Previous colonoscopy was in 2004, no polyps or complications per patient - Ambulatory referral to Gastroenterology  5. Severe obesity (BMI >= 40) (HCC) The patient is asked to make an attempt to improve diet and exercise patterns to aid in medical management of this problem.  6. Anemia of  chronic disease - CBC with Differential  7. On home oxygen therapy Continue Oxygen 2 L via n/c as previously prescribed   breakfast.  Dispense: 30 tablet; Refill: 5    RTC: Follow up in 3 months for Medicare Wellness Visit     The patient was given clear instructions to go to ER or return to medical center if symptoms do not improve, worsen or new problems develop. The patient verbalized understanding. Will notify patient with laboratory results.     Dorena Dew, FNP

## 2016-08-12 ENCOUNTER — Encounter: Payer: Self-pay | Admitting: Family Medicine

## 2016-09-01 ENCOUNTER — Other Ambulatory Visit: Payer: Self-pay | Admitting: Family Medicine

## 2016-09-01 ENCOUNTER — Telehealth: Payer: Self-pay | Admitting: Hematology

## 2016-09-01 DIAGNOSIS — R6889 Other general symptoms and signs: Secondary | ICD-10-CM

## 2016-09-01 MED ORDER — OSELTAMIVIR PHOSPHATE 75 MG PO CAPS: 75.0000 mg | ORAL_CAPSULE | Freq: Two times a day (BID) | ORAL | 0 refills | Status: DC

## 2016-09-01 NOTE — Telephone Encounter (Signed)
Patient C/0 of throat irritation for 1 day with cough.  Patient states she has taken robitussin this morning with  Minimal relief.  Patient also states that her grandchild was diagnosed and treated at urgent care for the flu recently.  Patient is concerned because she has lung issues.

## 2016-09-01 NOTE — Progress Notes (Signed)
Meds ordered this encounter  Medications  . oseltamivir (TAMIFLU) 75 MG capsule    Sig: Take 1 capsule (75 mg total) by mouth 2 (two) times daily.    Dispense:  10 capsule    Refill:  0

## 2016-09-07 ENCOUNTER — Telehealth: Payer: Self-pay | Admitting: Internal Medicine

## 2016-09-07 MED ORDER — BUDESONIDE-FORMOTEROL FUMARATE 160-4.5 MCG/ACT IN AERO: 2.0000 | INHALATION_SPRAY | Freq: Two times a day (BID) | RESPIRATORY_TRACT | 5 refills | Status: DC

## 2016-09-07 NOTE — Telephone Encounter (Signed)
Spoke with pt. She is needing a refill on Symbicort. Rx has been sent in to her preferred pharmacy. Nothing further was needed.

## 2016-09-14 ENCOUNTER — Telehealth: Payer: Self-pay

## 2016-09-14 NOTE — Telephone Encounter (Signed)
[place],  [place] this ok to refill? Is she on this Long term? Please advise. Thanks!

## 2016-09-15 ENCOUNTER — Telehealth: Payer: Self-pay | Admitting: Internal Medicine

## 2016-09-15 MED ORDER — PREDNISONE 10 MG PO TABS: 10.0000 mg | ORAL_TABLET | Freq: Every day | ORAL | 5 refills | Status: DC

## 2016-09-15 NOTE — Telephone Encounter (Signed)
Spoke with pt. She is aware of MW's response. Rx has been sent in. Nothing further was needed. 

## 2016-09-15 NOTE — Telephone Encounter (Signed)
Takes for gout/asthma  #100   Same sig  Ov before runs out

## 2016-09-15 NOTE — Telephone Encounter (Signed)
Spoke with pt. States that she needs a refill on Prednisone '10mg'$  daily. Advised her that from looking in her chart, her PCP fills this for her. Pt states her PCP will no longer fill this, they want [place] to take this over. She has an upcoming appointment with MW on 10/15/16.  MW - please advise. Thanks.

## 2016-09-22 ENCOUNTER — Telehealth: Payer: Self-pay

## 2016-09-22 DIAGNOSIS — E876 Hypokalemia: Secondary | ICD-10-CM

## 2016-09-22 MED ORDER — POTASSIUM CHLORIDE ER 10 MEQ PO TBCR: 10.0000 meq | EXTENDED_RELEASE_TABLET | Freq: Every day | ORAL | 1 refills | Status: DC

## 2016-09-22 NOTE — Telephone Encounter (Signed)
This has been sent into pharmacy. Thanks!

## 2016-09-28 ENCOUNTER — Telehealth: Payer: Self-pay

## 2016-09-28 DIAGNOSIS — M109 Gout, unspecified: Secondary | ICD-10-CM

## 2016-09-28 MED ORDER — FEBUXOSTAT 40 MG PO TABS: 40.0000 mg | ORAL_TABLET | Freq: Every day | ORAL | 5 refills | Status: DC

## 2016-09-28 NOTE — Telephone Encounter (Signed)
This has been sent into pharmacy. Thanks!

## 2016-10-01 ENCOUNTER — Other Ambulatory Visit: Payer: Self-pay

## 2016-10-01 DIAGNOSIS — M109 Gout, unspecified: Secondary | ICD-10-CM

## 2016-10-01 MED ORDER — FEBUXOSTAT 40 MG PO TABS: 40.0000 mg | ORAL_TABLET | Freq: Every day | ORAL | 5 refills | Status: DC

## 2016-10-15 ENCOUNTER — Ambulatory Visit (INDEPENDENT_AMBULATORY_CARE_PROVIDER_SITE_OTHER): Payer: Medicare Other | Admitting: Internal Medicine

## 2016-10-15 ENCOUNTER — Encounter: Payer: Self-pay | Admitting: Internal Medicine

## 2016-10-15 VITALS — BP 126/74 | HR 60 | Ht 66.0 in | Wt 366.0 lb

## 2016-10-15 DIAGNOSIS — J4530 Mild persistent asthma, uncomplicated: Secondary | ICD-10-CM | POA: Diagnosis not present

## 2016-10-15 DIAGNOSIS — J9611 Chronic respiratory failure with hypoxia: Secondary | ICD-10-CM

## 2016-10-15 DIAGNOSIS — J9612 Chronic respiratory failure with hypercapnia: Secondary | ICD-10-CM | POA: Diagnosis not present

## 2016-10-15 NOTE — Patient Instructions (Addendum)
Try prednisone 10 mg one half daily as per your med calendar   Plan A = Automatic = Symbiocort 160 Take 2 puffs first thing in am and then another 2 puffs about 12 hours later.   Work on inhaler technique:  relax and gently blow all the way out then take a nice smooth deep breath back in, triggering the inhaler at same time you start breathing in.  Hold for up to 5 seconds if you can. Blow out thru nose. Rinse and gargle with water when done     Plan B = Backup Only use your albuterol as a rescue medication to be used if you can't catch your breath by resting or doing a relaxed purse lip breathing pattern.  - The less you use it, the better it will work when you need it. - Ok to use the inhaler up to 2 puffs  every 4 hours if you must but call for appointment if use goes up over your usual need - Don't leave home without it !!  (think of it like the spare tire for your car)   Plan C = Crisis - only use your albuterol nebulizer if you first try Plan B and it fails to help > ok to use the nebulizer up to every 4 hours but if start needing it regularly call for immediate appointment   Plan D = Doctor - call me if B and C not adequate  Plan E = ER - go to ER or call 911 if all else fails    See your primary doctor with med calendar    Please schedule a follow up visit in 3 months but call sooner if needed with [name]  NP with all your meds and solutions for your machince

## 2016-10-15 NOTE — Progress Notes (Signed)
Subjective:    Patient ID: Pamela [name], female    DOB: [birth year]  MRN: [id number]   Brief patient profile:  77 yobf never smoker with 1st asthma attack in 1990s and on maint rx since late 90's maint rx and freq courses of prednisone 3-4 x per year despite advair and spiriva and freq saba referred to [name] 04/27/2014 by [name] s/p CT Bx 06/25/11 > no path report but per oncology = [id number] adenoca of lung > RT only per pt RUL completed 09/2011     History of Present Illness  04/27/2014 1st [name] office visit/ [name]   Chief Complaint  Patient presents with  . [name] Consult    Referred by [name]. [name]  Pt recently moved to [name] and states needs to establish with new pulmonary MD.  She c/o DOE and cough, and states that she feels these symptoms are currently under control.    on prednisone 10 mg daily  since late July 2015 and still on freq saba and 2lpm  Already used   2puffs proair am of ov  rec Stop spiriva and advair Start dulera 100 Take 2 puffs first thing in am and then another 2 puffs about 12 hours later.  Work on inhaler technique:  relax and gently blow all the way out then take a nice smooth deep breath back in, triggering the inhaler at same time you start breathing in.  Hold for up to 5 seconds if you can.  Rinse and gargle with water when done Only use your albuterol (proair) as a rescue medication  If proair not helping, then use the neb and if needing the neb more than occastional, then take prednisone 10 mg daily  Please schedule a follow up office visit in 4 weeks, sooner if needed with all inhalers in hand for pfts on return   11/30/2014 f/u ov/[name] re: mild intermittent asthma ? Related to weather / still on acei plus on prednisone 10 mg daily for gout  Chief Complaint  Patient presents with  . Acute Visit    Pt c/o increased SOB for the past month. She is  also coughing and wheezing. Cough is prod with minimal yellow to clear sputum. She uses proair 2 x daily on average.   Also due for CT chest to f/u dx of lung ca dx March 2013  never surgery/ RT only  Onset of dry cough / wheeze was insidious/ pattern is progressively worse day >> noct with increasing  need for saba but very poor hfa  rec Stop lisinopril and start valsartan 80 mg one daily instead  Only use your albuterol as a rescue medication   Restart dulera 100 Take 2 puffs first thing in am and then another 2 puffs about 12 hours later until you only need  your rescue inhaler no more than twice weekly for cough/wheeze/ short of breath  Follow up with [name] as planned and only see me if needed    06/06/2015  f/u ov/[name] re: worse since finished pred ? One week prior to [name]  / was taking for gout  Chief Complaint  Patient presents with  . Acute Visit    Increased SOB with or without exertion for the past 4 days. She also c/o cough and wheezing. Cough is prod with clear sputum.     Was doing much better while on prednisone  and gradually worse since off  Last used proventil about 7 am prior to OV  At 1130  On symbicort but hfa quite poor > see a/p Has neb but no meds for it  rec Prednisone Take 4 for three days 3 for three days 2 for three days 1 for three days and stop Work on inhaler technique:  relax and gently blow all the way out then take a nice smooth deep breath back in, triggering the inhaler at same time you start breathing in.  Hold for up to 5 seconds if you can. Blow out thru nose. Rinse and gargle with water when done Plan A = Automatic = symbicort 80 Take 2 puffs first thing in am and then another 2 puffs about 12 hours later.  Plan B = backup  Only use your albuterol(proair)   Plan C = crisis - only use nebulizer albuterol if you try the proair and it doesn't work       07/02/2015  f/u ov/[name] re: asthma vs uacs on pred 10 for gout and symb 80 2bid  and no saba    Chief Complaint  Patient presents with  . Follow-up    Breathing is much improved. She has not had to use rescue inhaler or neb.   Work on inhaler technique:  If start needing more albuterol for any reason  in any form return here asap    04/28/2016 NP  ER Follow up : Asthma /Sarcoid /Lung Cancer hx  Pt returns for ER follow up . Seen in ER on 04/08/16 for cough and dyspnea for 2 weeks.  She was treated with nebs and IV steroids. Discharged on prednisone 40mg  daily for 4 d.  BNP and troponin were nml. CXR without acute process.  She remains on Symbicort.. Rate use of albuterol.  Previous PFT 2015 with FEV1 93%, ratio 85, +BD response.  Is working on weight loss. Is down 40lbs since last ov.  Struggles with fluid retention, on Lasix 40mg  Twice daily  .  Has chronic LE swelling. She has CKD followed by [name].  Remains on o2 at 2l/m .  Split night sleep study in 07/2014 showed mild OSA only with AHI 6.9. O2 sats nml on 2l/m .  Over last 2 weeks has had to use albuterol 1 x day.  Since ER she is some better but not back to baseline. Still has intermittent wheezing but decreased .  No chest pain, orthopnea, edema or fever.  Flu shot is utd.  FENO today 04/28/2016  is 70  Moved here in 2015 From [name].  Dx with lung cancer 2012, tx with radiation 2013. At [facility] [name].  Now sees [name] with [name] . Has CT scan due 05/26/16 . No evidence of recurrence based on PET/CT scan done on 05/20/2015. She is a never smoker but has second hand smoke exposure She also carries dx of Sarcoid from previous bx. (followed previously by [name] at [name], with lymphadenopathy in chest , abd w/ possible spleen and liver involvement.  She has right anterior chest wall slowly growing masslike lesion in the right pectoralis minor muscle. Unclear etiology with FNA. Followed on CT chest . She is on prednisone 10mg  daily for gout never less  rec Continue  on Symbicort 2 puffs Twice daily   Add Zyrtec 10mg  At bedtime  As needed  Drainage.  Add Pepcid 20mg  At bedtime  .  Increase prednisone 20mg  daily for  1 week then back to '10mg'$ .     06/01/2016  f/u ov/[name] re: chronic asthma/sarcoid/ symb 80 2bid/ saba hfa and neb and pred back to 10 mg daily (floor)  Chief Complaint  Patient presents with  . Follow-up    Breathing has improved some since the last visit. She still wheezes occ wheezing. She has occ cough with min yellow sputum.  She is using albuterol inhaler 2 x per wk on average and she uses neb 3 x per wk on average.   Doe x room to room/ sleeping ok on 2lpm with hob elevation 30 degrees rec Plan A = Automatic = increase Symbicort to 160 Take 2 puffs first thing in am and then another 2 puffs about 12 hours later. (ok to use up your symbicort 80 if you run out of the 160 before next visit)  Work on inhaler technique:  relax and gently blow all the way out then take a nice smooth deep breath back in, triggering the inhaler at same time you start breathing in.  Hold for up to 5 seconds if you can. Blow out thru nose. Rinse and gargle with water when done Plan B = Backup Only use your albuterol as a rescue medication  Plan C = Crisis - only use your albuterol nebulizer if you first try Plan B and it fails to help      06/16/16  NP rec Follow med calendar closely and bring to each visit.  Continue on current regimen .     10/15/2016  f/u ov/[name] re:  Asthma/ no med calendar still maint on symb 160 2bid  Chief Complaint  Patient presents with  . Follow-up    Breathing is overall doing well and she denies any new co's.  She rarely uses albuterol inhaler or neb.      Not limited by breathing from desired activities  But very sedentary  No obvious day to day or daytime variability or assoc excess/ purulent sputum or mucus plugs or hemoptysis or cp or chest tightness, subjective wheeze or overt sinus or hb symptoms. No unusual exp hx or  h/o childhood pna/ asthma or knowledge of premature birth.  Sleeping ok without nocturnal  or early am exacerbation  of respiratory  c/o's or need for noct saba. Also denies any obvious fluctuation of symptoms with weather or environmental changes or other aggravating or alleviating factors except as outlined above   Current Medications, Allergies, Complete Past Medical History, Past Surgical History, Family History, and Social History were reviewed in [employer] record.  ROS  The following are not active complaints unless bolded sore throat, dysphagia, dental problems, itching, sneezing,  nasal congestion or excess/ purulent secretions, ear ache,   fever, chills, sweats, unintended wt loss, classically pleuritic or exertional cp,  orthopnea pnd or leg swelling, presyncope, palpitations, abdominal pain, anorexia, nausea, vomiting, diarrhea  or change in bowel or bladder habits, change in stools or urine, dysuria,hematuria,  rash, arthralgias, visual complaints, headache, numbness, weakness or ataxia or problems with walking or coordination,  change in mood/affect or memory.                         Objective:   Physical Exam  amb massively obese  Bf  nad    11/30/2014  343 > 06/06/2015  355 > 07/03/2015  360 > 06/01/2016  358 > 10/15/2016  366    Vital signs reviewed - Note on arrival  02 sats  99% on 2lpm       HEENT: nl urbinates, and orophanx. Nl external ear canals without cough reflex - dentures in place    NECK :  without JVD/Nodes/TM/ nl carotid upstrokes bilaterally   LUNGS: no acc muscle use,   Clear to A and P    CV:  RRR  no s3 or murmur or increase in P2  , 1-2 + edema, chronic venous insufficiency sym bilaterally    ABD:  Massively obese/ soft and nontender with limited excursion.    MS:  warm without deformities, calf tenderness, cyanosis or clubbing - walks very slow gait/ with cane  SKIN: warm and dry without lesions              I personally reviewed images and agree with radiology impression as follows:  CT w/o contrast  Chest     05/26/16 1. Stable CT of the chest. No evidence of local lung cancer recurrence or metastatic disease. 2. Medial right ventral chest wall mass is stable when compared with previous exam.

## 2016-10-19 NOTE — Assessment & Plan Note (Signed)
Started 2lpm 24/7 in [place] around 2013  See pfts 07/19/2014 with completely reversible airflow obst (so this is not copd) and a dlco of 71% so this is not ILD - HC03   05/26/16  = 34  Adequate control on present rx, reviewed in detail with pt > no change in rx needed  = 2lpm 24/7

## 2016-10-19 NOTE — Assessment & Plan Note (Addendum)
pfts 07/31/13  FEV1  1.53 (58%) with ratio 66 > p saba ratio 74 FEV1 1.68 (64%)  - -04/27/2014 p extensive coaching HFA effectiveness =    75% > try dulera 100 2 bid  - PFT's 07/19/2014 FEV1 2.00 (93%) and ratio 85 after 21% improvement from saba with no inhalers x one week  - trial off acei 12/01/2014 due to pseudoasthma component > resolved  - Spirometry 06/01/2016  FEV1 1.06 (48%)  Ratio 61  - FENO 06/01/2016  =   85 on symbicort 80 x2 > increase to 160  - Prednisone 10 mg floor x years, increased to 20 mg if needing neb   - 10/15/2016 reduced pred to 5 mg daily  - 10/15/2016  After extensive coaching HFA effectiveness =    75%   Strongly doubt that she is really systemic steroid dep and she has yet to prove to me she's really compliant with her other meds but should be able to taper to 5 mg daily for now which should help with wt loss (see separate a/p)   I had an extended discussion with the patient reviewing all relevant studies completed to date and  lasting 15 to 20 minutes of a 25 minute visit    Each maintenance medication was reviewed in detail including most importantly the difference between maintenance and prns and under what circumstances the prns are to be triggered using an action plan format that is not reflected in the computer generated alphabetically organized AVS but trather by a customized med calendar that reflects the AVS meds with confirmed 100% correlation.   In addition, Please see AVS for unique instructions that I personally wrote and verbalized to the the pt in detail and then reviewed with pt  by my nurse highlighting any  changes in therapy recommended at today's visit to their plan of care.

## 2016-10-19 NOTE — Assessment & Plan Note (Addendum)
Body mass index is 59.07 kg/m.   trending up on pred 10 mg daily >>  adjusting down as low as tol should help Lab Results  Component Value Date   TSH 3.934 05/21/2014     Contributing to gerd risk/ doe/reviewed the need and the process to achieve and maintain neg calorie balance > defer f/u primary care including intermittently monitoring thyroid status

## 2016-10-30 ENCOUNTER — Telehealth: Payer: Self-pay

## 2016-10-30 DIAGNOSIS — I5032 Chronic diastolic (congestive) heart failure: Secondary | ICD-10-CM

## 2016-10-30 MED ORDER — FUROSEMIDE 40 MG PO TABS: 40.0000 mg | ORAL_TABLET | Freq: Two times a day (BID) | ORAL | 5 refills | Status: DC

## 2016-10-30 NOTE — Telephone Encounter (Signed)
This has been sent into pharmacy. Thanks!

## 2016-11-03 ENCOUNTER — Ambulatory Visit (INDEPENDENT_AMBULATORY_CARE_PROVIDER_SITE_OTHER): Payer: Medicare Other | Admitting: Family Medicine

## 2016-11-03 ENCOUNTER — Encounter: Payer: Self-pay | Admitting: Family Medicine

## 2016-11-03 VITALS — BP 145/63 | HR 65 | Temp 98.4°F | Resp 18 | Ht 66.0 in | Wt 367.0 lb

## 2016-11-03 DIAGNOSIS — B351 Tinea unguium: Secondary | ICD-10-CM

## 2016-11-03 DIAGNOSIS — R7303 Prediabetes: Secondary | ICD-10-CM | POA: Diagnosis not present

## 2016-11-03 DIAGNOSIS — N182 Chronic kidney disease, stage 2 (mild): Secondary | ICD-10-CM

## 2016-11-03 DIAGNOSIS — Z9981 Dependence on supplemental oxygen: Secondary | ICD-10-CM | POA: Diagnosis not present

## 2016-11-03 DIAGNOSIS — I10 Essential (primary) hypertension: Secondary | ICD-10-CM | POA: Diagnosis not present

## 2016-11-03 DIAGNOSIS — L600 Ingrowing nail: Secondary | ICD-10-CM

## 2016-11-03 DIAGNOSIS — Z1211 Encounter for screening for malignant neoplasm of colon: Secondary | ICD-10-CM

## 2016-11-03 LAB — POCT URINALYSIS DIP (DEVICE)
Bilirubin Urine: NEGATIVE
GLUCOSE, UA: NEGATIVE mg/dL
Hgb urine dipstick: NEGATIVE
Ketones, ur: NEGATIVE mg/dL
LEUKOCYTES UA: NEGATIVE
NITRITE: NEGATIVE
PROTEIN: NEGATIVE mg/dL
SPECIFIC GRAVITY, URINE: 1.01 (ref 1.005–1.030)
UROBILINOGEN UA: 0.2 mg/dL (ref 0.0–1.0)
pH: 6.5 (ref 5.0–8.0)

## 2016-11-03 LAB — BASIC METABOLIC PANEL WITH GFR
BUN: 25 mg/dL (ref 7–25)
CALCIUM: 8.9 mg/dL (ref 8.6–10.4)
CO2: 24 mmol/L (ref 20–31)
Chloride: 101 mmol/L (ref 98–110)
Creat: 1.67 mg/dL — ABNORMAL HIGH (ref 0.50–0.99)
GFR, EST AFRICAN AMERICAN: 37 mL/min — AB (ref >=60)
GFR, EST NON AFRICAN AMERICAN: 32 mL/min — AB (ref >=60)
GLUCOSE: 92 mg/dL (ref 65–99)
Potassium: 4.2 mmol/L (ref 3.5–5.3)
SODIUM: 143 mmol/L (ref 135–146)

## 2016-11-03 NOTE — Patient Instructions (Addendum)
Sent referral to gastroenterology for colonoscopy.  Sent referral to podiatry for onychomycosis Follow up with specialists as scheduled for chronic conditions   Chronic Kidney Disease, Adult Chronic kidney disease (CKD) happens when the kidneys are damaged during a time of 3 or more months. The kidneys are two organs that do many important jobs in the body. These jobs include:  Removing wastes and extra fluids from the blood.  Making hormones that maintain the amount of fluid in your tissues and blood vessels.  Making sure that the body has the right amount of fluids and chemicals. Most of the time, this condition does not go away, but it can usually be controlled. Steps must be taken to slow down the kidney damage or stop it from getting worse. Otherwise, the kidneys may stop working. Follow these instructions at home:  Follow your diet as told by your doctor. You may need to avoid alcohol, salty foods (sodium), and foods that are high in potassium, calcium, and protein.  Take over-the-counter and prescription medicines only as told by your doctor. Do not take any new medicines unless your doctor says you can do that. These include vitamins and minerals.  Medicines and nutritional supplements can make kidney damage worse.  Your doctor may need to change how much medicine you take.  Do not use any tobacco products. These include cigarettes, chewing tobacco, and e-cigarettes. If you need help quitting, ask your doctor.  Keep all follow-up visits as told by your doctor. This is important.  Check your blood pressure. Tell your doctor if there are changes to your blood pressure.  Get to a healthy weight. Stay at that weight. If you need help with this, ask your doctor.  Start or continue an exercise plan. Try to exercise at least 30 minutes a day, 5 days a week.  Stay up-to-date with your shots (immunizations) as told by your doctor. Contact a doctor if:  Your symptoms get  worse.  You have new symptoms. Get help right away if:  You have symptoms of end-stage kidney disease. These include:  Headaches.  Skin that is darker or lighter than normal.  Numbness in your hands or feet.  Easy bruising.  Having hiccups often.  Chest pain.  Shortness of breath.  Stopping of menstrual periods in women.  You have a fever.  You are making very little pee (urine).  You have pain or bleeding when you pee (urinate). This information is not intended to replace advice given to you by your health care provider. Make sure you discuss any questions you have with your health care provider. Document Released: 09/30/2009 Document Revised: 12/12/2015 Document Reviewed: 03/04/2012 Elsevier Interactive Patient Education  2017 [employer]  Obesity, Adult Obesity is the condition of having too much total body fat. Being overweight or obese means that your weight is greater than what is considered healthy for your body size. Obesity is determined by a measurement called BMI. BMI is an estimate of body fat and is calculated from height and weight. For adults, a BMI of 30 or higher is considered obese. Obesity can eventually lead to other health concerns and major illnesses, including:  Stroke.  Coronary artery disease (CAD).  Type 2 diabetes.  Some types of cancer, including cancers of the colon, breast, uterus, and gallbladder.  Osteoarthritis.  High blood pressure (hypertension).  High cholesterol.  Sleep apnea.  Gallbladder stones.  Infertility problems. What are the causes? The main cause of obesity is taking in (consuming) more  calories than your body uses for energy. Other factors that contribute to this condition may include:  Being born with genes that make you more likely to become obese.  Having a medical condition that causes obesity. These conditions include:  Hypothyroidism.  Polycystic ovarian syndrome (PCOS).  Binge-eating  disorder.  Cushing syndrome.  Taking certain medicines, such as steroids, antidepressants, and seizure medicines.  Not being physically active (sedentary lifestyle).  Living where there are limited places to exercise safely or buy healthy foods.  Not getting enough sleep. What increases the risk? The following factors may increase your risk of this condition:  Having a family history of obesity.  Being a woman of African-American descent.  Being a man of Hispanic descent. What are the signs or symptoms? Having excessive body fat is the main symptom of this condition. How is this diagnosed? This condition may be diagnosed based on:  Your symptoms.  Your medical history.  A physical exam. Your health care provider may measure:  Your BMI. If you are an adult with a BMI between 25 and less than 30, you are considered overweight. If you are an adult with a BMI of 30 or higher, you are considered obese.  The distances around your hips and your waist (circumferences). These may be compared to each other to help diagnose your condition.  Your skinfold thickness. Your health care provider may gently pinch a fold of your skin and measure it. How is this treated? Treatment for this condition often includes changing your lifestyle. Treatment may include some or all of the following:  Dietary changes. Work with your health care provider and a dietitian to set a weight-loss goal that is healthy and reasonable for you. Dietary changes may include eating:  Smaller portions. A portion size is the amount of a particular food that is healthy for you to eat at one time. This varies from person to person.  Low-calorie or low-fat options.  More whole grains, fruits, and vegetables.  Regular physical activity. This may include aerobic activity (cardio) and strength training.  Medicine to help you lose weight. Your health care provider may prescribe medicine if you are unable to lose 1 pound  a week after 6 weeks of eating more healthily and doing more physical activity.  Surgery. Surgical options may include gastric banding and gastric bypass. Surgery may be done if:  Other treatments have not helped to improve your condition.  You have a BMI of 40 or higher.  You have life-threatening health problems related to obesity. Follow these instructions at home:   Eating and drinking    Follow recommendations from your health care provider about what you eat and drink. Your health care provider may advise you to:  Limit fast foods, sweets, and processed snack foods.  Choose low-fat options, such as low-fat milk instead of whole milk.  Eat 5 or more servings of fruits or vegetables every day.  Eat at home more often. This gives you more control over what you eat.  Choose healthy foods when you eat out.  Learn what a healthy portion size is.  Keep low-fat snacks on hand.  Avoid sugary drinks, such as soda, fruit juice, iced tea sweetened with sugar, and flavored milk.  Eat a healthy breakfast.  Drink enough water to keep your urine clear or pale yellow.  Do not go without eating for long periods of time (do not fast) or follow a fad diet. Fasting and fad diets can be unhealthy and  even dangerous. Physical Activity   Exercise regularly, as told by your health care provider. Ask your health care provider what types of exercise are safe for you and how often you should exercise.  Warm up and stretch before being active.  Cool down and stretch after being active.  Rest between periods of activity. Lifestyle   Limit the time that you spend in front of your TV, computer, or video game system.  Find ways to reward yourself that do not involve food.  Limit alcohol intake to no more than 1 drink a day for nonpregnant women and 2 drinks a day for men. One drink equals 12 oz of beer, 5 oz of wine, or 1 oz of hard liquor. General instructions   Keep a weight loss  journal to keep track of the food you eat and how much you exercise you get.  Take over-the-counter and prescription medicines only as told by your health care provider.  Take vitamins and supplements only as told by your health care provider.  Consider joining a support group. Your health care provider may be able to recommend a support group.  Keep all follow-up visits as told by your health care provider. This is important. Contact a health care provider if:  You are unable to meet your weight loss goal after 6 weeks of dietary and lifestyle changes. This information is not intended to replace advice given to you by your health care provider. Make sure you discuss any questions you have with your health care provider. Document Released: 08/13/2004 Document Revised: 12/09/2015 Document Reviewed: 04/24/2015 Elsevier Interactive Patient Education  2017 [place] Heart-healthy meal planning includes:  Limiting unhealthy fats.  Increasing healthy fats.  Making other small dietary changes. You may need to talk with your doctor or a diet specialist (dietitian) to create an eating plan that is right for you. What types of fat should I choose?  Choose healthy fats. These include olive oil and canola oil, flaxseeds, walnuts, almonds, and seeds.  Eat more omega-3 fats. These include salmon, mackerel, sardines, tuna, flaxseed oil, and ground flaxseeds. Try to eat fish at least twice each week.  Limit saturated fats.  Saturated fats are often found in animal products, such as meats, butter, and cream.  Plant sources of saturated fats include palm oil, palm kernel oil, and coconut oil.  Avoid foods with partially hydrogenated oils in them. These include stick margarine, some tub margarines, cookies, crackers, and other baked goods. These contain trans fats. What general guidelines do I need to follow?  Check food labels carefully. Identify foods with trans  fats or high amounts of saturated fat.  Fill one half of your plate with vegetables and green salads. Eat 4-5 servings of vegetables per day. A serving of vegetables is:  1 cup of raw leafy vegetables.   cup of raw or cooked cut-up vegetables.   cup of vegetable juice.  Fill one fourth of your plate with whole grains. Look for the word "whole" as the first word in the ingredient list.  Fill one fourth of your plate with lean protein foods.  Eat 4-5 servings of fruit per day. A serving of fruit is:  One medium whole fruit.   cup of dried fruit.   cup of fresh, frozen, or canned fruit.   cup of 100% fruit juice.  Eat more foods that contain soluble fiber. These include apples, broccoli, carrots, beans, peas, and barley. Try to get 20-30 g  of fiber per day.  Eat more home-cooked food. Eat less restaurant, buffet, and fast food.  Limit or avoid alcohol.  Limit foods high in starch and sugar.  Avoid fried foods.  Avoid frying your food. Try baking, boiling, grilling, or broiling it instead. You can also reduce fat by:  Removing the skin from poultry.  Removing all visible fats from meats.  Skimming the fat off of stews, soups, and gravies before serving them.  Steaming vegetables in water or broth.  Lose weight if you are overweight.  Eat 4-5 servings of nuts, legumes, and seeds per week:  One serving of dried beans or legumes equals  cup after being cooked.  One serving of nuts equals 1 ounces.  One serving of seeds equals  ounce or one tablespoon.  You may need to keep track of how much salt or sodium you eat. This is especially true if you have high blood pressure. Talk with your doctor or dietitian to get more information. What foods can I eat? Grains  Breads, including [place], white, pita, wheat, raisin, rye, oatmeal, and [place]. Tortillas that are neither fried nor made with lard or trans fat. Low-fat rolls, including hotdog and hamburger buns and  English muffins. Biscuits. Muffins. Waffles. Pancakes. Light popcorn. Whole-grain cereals. Flatbread. Melba toast. Pretzels. Breadsticks. Rusks. Low-fat snacks. Low-fat crackers, including oyster, saltine, matzo, graham, animal, and rye. Rice and pasta, including brown rice and pastas that are made with whole wheat. Vegetables  All vegetables. Fruits  All fruits, but limit coconut. Meats and Other Protein Sources  Lean, well-trimmed beef, veal, pork, and lamb. Chicken and [place] without skin. All fish and shellfish. Wild duck, rabbit, pheasant, and venison. Egg whites or low-cholesterol egg substitutes. Dried beans, peas, lentils, and tofu. Seeds and most nuts. Dairy  Low-fat or nonfat cheeses, including ricotta, string, and mozzarella. Skim or 1% milk that is liquid, powdered, or evaporated. Buttermilk that is made with low-fat milk. Nonfat or low-fat yogurt. Beverages  Mineral water. Diet carbonated beverages. Sweets and Desserts  Sherbets and fruit ices. Honey, jam, marmalade, jelly, and syrups. Meringues and gelatins. Pure sugar candy, such as hard candy, jelly beans, gumdrops, mints, marshmallows, and small amounts of dark chocolate. [employer] Eat all sweets and desserts in moderation. Fats and Oils  Nonhydrogenated (trans-free) margarines. Vegetable oils, including soybean, sesame, sunflower, olive, peanut, safflower, corn, canola, and cottonseed. Salad dressings or mayonnaise made with a vegetable oil. Limit added fats and oils that you use for cooking, baking, salads, and as spreads. Other  Cocoa powder. Coffee and tea. All seasonings and condiments. The items listed above may not be a complete list of recommended foods or beverages. Contact your dietitian for more options.  What foods are not recommended? Grains  Breads that are made with saturated or trans fats, oils, or whole milk. Croissants. Butter rolls. Cheese breads. Sweet rolls. Donuts. Buttered popcorn. Chow mein  noodles. High-fat crackers, such as cheese or butter crackers. Meats and Other Protein Sources  Fatty meats, such as hotdogs, short ribs, sausage, spareribs, bacon, rib eye roast or steak, and mutton. High-fat deli meats, such as salami and bologna. Caviar. Domestic duck and goose. Organ meats, such as kidney, liver, sweetbreads, and heart. Dairy  Cream, sour cream, cream cheese, and creamed cottage cheese. Whole-milk cheeses, including blue (bleu), Monte[place] Jack, [place], [place], American, [place], Swiss, cheddar, [place], and [place]. Whole or 2% milk that is liquid, evaporated, or condensed. Whole buttermilk. Cream sauce or high-fat cheese sauce.  Yogurt that is made from whole milk. Beverages  Regular sodas and juice drinks with added sugar. Sweets and Desserts  Frosting. Pudding. Cookies. Cakes other than angel food cake. Candy that has milk chocolate or white chocolate, hydrogenated fat, butter, coconut, or unknown ingredients. Buttered syrups. Full-fat ice cream or ice cream drinks. Fats and Oils  Gravy that has suet, meat fat, or shortening. Cocoa butter, hydrogenated oils, palm oil, coconut oil, palm kernel oil. These can often be found in baked products, candy, fried foods, nondairy creamers, and whipped toppings. Solid fats and shortenings, including bacon fat, salt pork, lard, and butter. Nondairy cream substitutes, such as coffee creamers and sour cream substitutes. Salad dressings that are made of unknown oils, cheese, or sour cream. The items listed above may not be a complete list of foods and beverages to avoid. Contact your dietitian for more information.  This information is not intended to replace advice given to you by your health care provider. Make sure you discuss any questions you have with your health care provider. Document Released: 01/05/2012 Document Revised: 12/12/2015 Document Reviewed: 12/28/2013 Elsevier Interactive Patient Education  2017 [employer] Inc[employer] Fat and  Cholesterol Restricted Diet High levels of fat and cholesterol in your blood may lead to various health problems, such as diseases of the heart, blood vessels, gallbladder, liver, and pancreas. Fats are concentrated sources of energy that come in various forms. Certain types of fat, including saturated fat, may be harmful in excess. Cholesterol is a substance needed by your body in small amounts. Your body makes all the cholesterol it needs. Excess cholesterol comes from the food you eat. When you have high levels of cholesterol and saturated fat in your blood, health problems can develop because the excess fat and cholesterol will gather along the walls of your blood vessels, causing them to narrow. Choosing the right foods will help you control your intake of fat and cholesterol. This will help keep the levels of these substances in your blood within normal limits and reduce your risk of disease. What is my plan? Your health care provider recommends that you:  Limit your fat intake to ______% or less of your total calories per day.  Limit the amount of cholesterol in your diet to less than _________mg per day.  Eat 20-30 grams of fiber each day. What types of fat should I choose?  Choose healthy fats more often. Choose monounsaturated and polyunsaturated fats, such as olive and canola oil, flaxseeds, walnuts, almonds, and seeds.  Eat more omega-3 fats. Good choices include salmon, mackerel, sardines, tuna, flaxseed oil, and ground flaxseeds. Aim to eat fish at least two times a week.  Limit saturated fats. Saturated fats are primarily found in animal products, such as meats, butter, and cream. Plant sources of saturated fats include palm oil, palm kernel oil, and coconut oil.  Avoid foods with partially hydrogenated oils in them. These contain trans fats. Examples of foods that contain trans fats are stick margarine, some tub margarines, cookies, crackers, and other baked goods. What general  guidelines do I need to follow? These guidelines for healthy eating will help you control your intake of fat and cholesterol:  Check food labels carefully to identify foods with trans fats or high amounts of saturated fat.  Fill one half of your plate with vegetables and green salads.  Fill one fourth of your plate with whole grains. Look for the word "whole" as the first word in the ingredient list.  Fill one fourth  of your plate with lean protein foods.  Limit fruit to two servings a day. Choose fruit instead of juice.  Eat more foods that contain fiber, such as apples, broccoli, carrots, beans, peas, and barley.  Eat more home-cooked food and less restaurant, buffet, and fast food.  Limit or avoid alcohol.  Limit foods high in starch and sugar.  Limit fried foods.  Cook foods using methods other than frying. Baking, boiling, grilling, and broiling are all great options.  Lose weight if you are overweight. Losing just 5-10% of your initial body weight can help your overall health and prevent diseases such as diabetes and heart disease. What foods can I eat? Grains   Whole grains, such as whole wheat or whole grain breads, crackers, cereals, and pasta. Unsweetened oatmeal, bulgur, barley, quinoa, or brown rice. Corn or whole wheat flour tortillas. Vegetables   Fresh or frozen vegetables (raw, steamed, roasted, or grilled). Green salads. Fruits   All fresh, canned (in natural juice), or frozen fruits. Meats and other protein foods   Ground beef (85% or leaner), grass-fed beef, or beef trimmed of fat. Skinless chicken or [place]. Ground chicken or [place]. Pork trimmed of fat. All fish and seafood. Eggs. Dried beans, peas, or lentils. Unsalted nuts or seeds. Unsalted canned or dry beans. Dairy   Low-fat dairy products, such as skim or 1% milk, 2% or reduced-fat cheeses, low-fat ricotta or cottage cheese, or plain low-fat yo Fats and oils   Tub margarines without trans fats.  Light or reduced-fat mayonnaise and salad dressings. Avocado. Olive, canola, sesame, or safflower oils. Natural peanut or almond butter (choose ones without added sugar and oil). The items listed above may not be a complete list of recommended foods or beverages. Contact your dietitian for more options.  Foods to avoid Grains   White bread. White pasta. White rice. Cornbread. Bagels, pastries, and croissants. Crackers that contain trans fat. Vegetables   White potatoes. Corn. Creamed or fried vegetables. Vegetables in a cheese sauce. Fruits   Dried fruits. Canned fruit in light or heavy syrup. Fruit juice. Meats and other protein foods   Fatty cuts of meat. Ribs, chicken wings, bacon, sausage, bologna, salami, chitterlings, fatback, hot dogs, bratwurst, and packaged luncheon meats. Liver and organ meats. Dairy   Whole or 2% milk, cream, half-and-half, and cream cheese. Whole milk cheeses. Whole-fat or sweetened yogurt. Full-fat cheeses. Nondairy creamers and whipped toppings. Processed cheese, cheese spreads, or cheese curds. Beverages   Alcohol. Sweetened drinks (such as sodas, lemonade, and fruit drinks or punches). Fats and oils   Butter, stick margarine, lard, shortening, ghee, or bacon fat. Coconut, palm kernel, or palm oils. Sweets and desserts   Corn syrup, sugars, honey, and molasses. Candy. Jam and jelly. Syrup. Sweetened cereals. Cookies, pies, cakes, donuts, muffins, and ice cream. The items listed above may not be a complete list of foods and beverages to avoid. Contact your dietitian for more information.  This information is not intended to replace advice given to you by your health care provider. Make sure you discuss any questions you have with your health care provider. Document Released: 07/06/2005 Document Revised: 07/27/2014 Document Reviewed: 10/04/2013 Elsevier Interactive Patient Education  2017 [employer].

## 2016-11-03 NOTE — Progress Notes (Signed)
Subjective:    Patient ID: Pamela [name], female    DOB: 1951/05/08, 66 y.o.   MRN: [id number]  HPI  Pamela [name] is a 66 year old female with history of chronic diastolic CHF, gouty arthritis, HTN, ,and sarcodosis that presents for a 3 month follow up of hypertension.  She states that she feels well and is without complaint. She states that she has not been following a lowfat, low sodium diet and has not increased her daily activity. She has been taking all medications consistently. She also has a history of chronic kidney disese and is followed by [facility]. Her previous creatinine is 1.59.   She has recently followed up with Dr. Melvyn Novas, pulmonologist for sarcoidosis and history of asthma. She is evaluated by Dr. Irene Limbo, oncology every three months. Patient denies headache, chest pain, abdominal pain, dysuria, rectal bleeding, nausea, vomiting, diarrhea, or constipation. No new weakness tingling or numbness. She also denies cough, or shortness of breath.   Pamela [name] has severe obesity. She has been following a low sodium, carbohydrate modified, low calorie diet consistently over the past several months. She is unable to exercise due to history of sarcoidosis and chronic oxygen use.    Past Medical History:  Diagnosis Date  . Arthritis   . Asthma   . Cancer ([place])    lung, adenocarcinoma right lung 2012  . CHF (congestive heart failure) (HCC)    Preserved EF  . Congenital single kidney    With chronic kidney disease  . COPD (chronic obstructive pulmonary disease) ([place])   . Gout   . Hypertension   . Lung cancer [facility]) 2012   Right upper lobe lung adenocarcinoma diagnosed with needle biopsy treated by SBRT finished treatment April 2013 has been monitored since  . Mass of chest wall, right    Right chest wall mass 7.3 cm biopsy on 12/13/2013. Patient notes it was consistent with sarcoidosis but actual pathology results not available.  . Oxygen deficiency   . Sarcoidosis    . Sickle cell trait ([place])   . Sleep apnea    Social History   Social History  . Marital status: Divorced    Spouse name: N/A  . Number of children: 2  . Years of education: N/A   Social History Main Topics  . Smoking status: Never Smoker  . Smokeless tobacco: Never Used  . Alcohol use No  . Drug use: No  . Sexual activity: Not Asked   Other Topics Concern  . None   Social History Narrative   Lives with son.  One son is deceased.     Allergies  Allergen Reactions  . Sulfur Rash   Immunization History  Administered Date(s) Administered  . Influenza,inj,Quad PF,36+ Mos 04/13/2014, 04/16/2015, 04/03/2016  . Pneumococcal Conjugate-13 04/03/2016  . Pneumococcal Polysaccharide-23 04/13/2014  . Tdap 01/02/2015  . Zoster 05/21/2014   Review of Systems  Constitutional: Negative.  Negative for fatigue and fever.  HENT: Negative.   Eyes: Negative.  Negative for visual disturbance.  Respiratory: Negative for chest tightness and shortness of breath.   Cardiovascular: Negative.   Gastrointestinal: Negative.   Endocrine: Negative.  Negative for polydipsia, polyphagia and polyuria.  Genitourinary: Negative.   Musculoskeletal: Positive for myalgias.  Skin: Negative.   Allergic/Immunologic: Negative.   Neurological: Negative.  Negative for light-headedness and headaches.  Hematological: Negative.   Psychiatric/Behavioral: Negative.  Negative for suicidal ideas.       Objective:   Physical Exam  Constitutional:  She is oriented to person, place, and time. Vital signs are normal. She appears well-developed and well-nourished. Nasal cannula in place.  Morbid obesity  HENT:  Head: Normocephalic and atraumatic.  Right Ear: Hearing, tympanic membrane, external ear and ear canal normal.  Left Ear: Hearing, tympanic membrane, external ear and ear canal normal.  Mouth/Throat: Uvula is midline and oropharynx is clear and moist. She has dentures.  Eyes: Conjunctivae and EOM are  normal. Pupils are equal, round, and reactive to light. Lids are everted and swept, no foreign bodies found.  Neck: Trachea normal and normal range of motion. Neck supple.  Cardiovascular: Normal rate, regular rhythm, S1 normal, S2 normal, normal heart sounds and intact distal pulses.   Bilateral lower extremity edema  Pulmonary/Chest: Effort normal and breath sounds normal.  Abdominal: Soft. Normal appearance and bowel sounds are normal.  Increased abdominal girth  Neurological: She is alert and oriented to person, place, and time. She has normal reflexes.  Skin: Skin is warm, dry and intact.  Hyperpigmentation to lower extremities Toenail thickening and yellowing   Psychiatric: She has a normal mood and affect. Her speech is normal and behavior is normal. Judgment and thought content normal. Cognition and memory are normal.      BP (!) 145/63 (BP Location: Left Arm, Patient Position: Sitting, Cuff Size: Large)   Pulse 65   Temp 98.4 F (36.9 C) (Oral)   Resp 18   Ht '5\' 6"'$  (1.676 m)   Wt (!) 367 lb (166.5 kg)   SpO2 98% Comment: 2 L o2  BMI 59.24 kg/m  Assessment & Plan:  1. Essential hypertension Blood pressure is above goal on current medication regimen. The patient is asked to make an attempt to improve diet and exercise patterns to aid in medical management of this problem.   2. Onychomycosis Patient was previously referred to podiatry. She was in the process of moving and her phone was temporarily disconnected. Will resend referral. Toenail yellowing and thickening.   - Ambulatory referral to Podiatry  3. Ingrown nail  - Ambulatory referral to Podiatry  4. Stage 2 chronic kidney disease  - BASIC METABOLIC PANEL WITH GFR  5. Prediabetes Recommend a lowfat, low carbohydrate diet divided over 5-6 small meals, increase water intake to 6-8 glasses, and increase activity leve.    6. On home oxygen therapy Continue oxygen at 2 L//min as prescribed by pulmonology  7.  Severe obesity (BMI >= 40) (HCC) Continue to work on diet. Schedule a follow up with nutrition and diabetes education. She is non adherent to carbohydrate modified, low fat diet.   8. Screening for colon cancer - Ambulatory referral to Gastroenterology    RTC: 3 months for follow up of chronic conditons   [name]  MSN, FNP-C [facility] [address] [place], [place] [address] [phone]

## 2016-11-10 DIAGNOSIS — J45909 Unspecified asthma, uncomplicated: Secondary | ICD-10-CM | POA: Diagnosis not present

## 2016-11-10 DIAGNOSIS — I509 Heart failure, unspecified: Secondary | ICD-10-CM | POA: Diagnosis not present

## 2016-11-10 DIAGNOSIS — M109 Gout, unspecified: Secondary | ICD-10-CM | POA: Diagnosis not present

## 2016-11-10 DIAGNOSIS — D631 Anemia in chronic kidney disease: Secondary | ICD-10-CM | POA: Diagnosis not present

## 2016-11-10 DIAGNOSIS — E8881 Metabolic syndrome: Secondary | ICD-10-CM | POA: Diagnosis not present

## 2016-11-10 DIAGNOSIS — I129 Hypertensive chronic kidney disease with stage 1 through stage 4 chronic kidney disease, or unspecified chronic kidney disease: Secondary | ICD-10-CM | POA: Diagnosis not present

## 2016-11-10 DIAGNOSIS — D573 Sickle-cell trait: Secondary | ICD-10-CM | POA: Diagnosis not present

## 2016-11-10 DIAGNOSIS — D869 Sarcoidosis, unspecified: Secondary | ICD-10-CM | POA: Diagnosis not present

## 2016-11-10 DIAGNOSIS — J449 Chronic obstructive pulmonary disease, unspecified: Secondary | ICD-10-CM | POA: Diagnosis not present

## 2016-11-10 DIAGNOSIS — N2581 Secondary hyperparathyroidism of renal origin: Secondary | ICD-10-CM | POA: Diagnosis not present

## 2016-11-10 DIAGNOSIS — N189 Chronic kidney disease, unspecified: Secondary | ICD-10-CM | POA: Diagnosis not present

## 2016-11-10 DIAGNOSIS — C3490 Malignant neoplasm of unspecified part of unspecified bronchus or lung: Secondary | ICD-10-CM | POA: Diagnosis not present

## 2016-11-26 ENCOUNTER — Telehealth: Payer: Self-pay | Admitting: Hematology

## 2016-11-26 ENCOUNTER — Encounter: Payer: Self-pay | Admitting: Hematology

## 2016-11-26 ENCOUNTER — Ambulatory Visit (HOSPITAL_BASED_OUTPATIENT_CLINIC_OR_DEPARTMENT_OTHER): Payer: Medicare Other | Admitting: Hematology

## 2016-11-26 ENCOUNTER — Other Ambulatory Visit (HOSPITAL_BASED_OUTPATIENT_CLINIC_OR_DEPARTMENT_OTHER): Payer: Medicare Other

## 2016-11-26 VITALS — BP 129/49 | HR 72 | Temp 98.6°F | Resp 18 | Wt 370.4 lb

## 2016-11-26 DIAGNOSIS — Z85118 Personal history of other malignant neoplasm of bronchus and lung: Secondary | ICD-10-CM

## 2016-11-26 DIAGNOSIS — D573 Sickle-cell trait: Secondary | ICD-10-CM

## 2016-11-26 DIAGNOSIS — R222 Localized swelling, mass and lump, trunk: Secondary | ICD-10-CM | POA: Diagnosis not present

## 2016-11-26 DIAGNOSIS — I10 Essential (primary) hypertension: Secondary | ICD-10-CM

## 2016-11-26 DIAGNOSIS — C3410 Malignant neoplasm of upper lobe, unspecified bronchus or lung: Secondary | ICD-10-CM

## 2016-11-26 LAB — CBC & DIFF AND RETIC
BASO%: 0.2 % (ref 0.0–2.0)
BASOS ABS: 0 10*3/uL (ref 0.0–0.1)
EOS ABS: 0.7 10*3/uL — AB (ref 0.0–0.5)
EOS%: 13.3 % — ABNORMAL HIGH (ref 0.0–7.0)
HCT: 36.9 % (ref 34.8–46.6)
HEMOGLOBIN: 11.6 g/dL (ref 11.6–15.9)
IMMATURE RETIC FRACT: 5.3 % (ref 1.60–10.00)
LYMPH#: 1 10*3/uL (ref 0.9–3.3)
LYMPH%: 17.7 % (ref 14.0–49.7)
MCH: 22.6 pg — ABNORMAL LOW (ref 25.1–34.0)
MCHC: 31.4 g/dL — ABNORMAL LOW (ref 31.5–36.0)
MCV: 71.8 fL — AB (ref 79.5–101.0)
MONO#: 0.4 10*3/uL (ref 0.1–0.9)
MONO%: 7.1 % (ref 0.0–14.0)
NEUT#: 3.4 10*3/uL (ref 1.5–6.5)
NEUT%: 61.7 % (ref 38.4–76.8)
Platelets: 149 10*3/uL (ref 145–400)
RBC: 5.14 10*6/uL (ref 3.70–5.45)
RDW: 14.8 % — ABNORMAL HIGH (ref 11.2–14.5)
RETIC CT ABS: 71.45 10*3/uL (ref 33.70–90.70)
Retic %: 1.39 % (ref 0.70–2.10)
WBC: 5.5 10*3/uL (ref 3.9–10.3)

## 2016-11-26 LAB — COMPREHENSIVE METABOLIC PANEL
ALBUMIN: 3.4 g/dL — AB (ref 3.5–5.0)
ALK PHOS: 144 U/L (ref 40–150)
ALT: 16 U/L (ref 0–55)
ANION GAP: 11 meq/L (ref 3–11)
AST: 19 U/L (ref 5–34)
BILIRUBIN TOTAL: 0.5 mg/dL (ref 0.20–1.20)
BUN: 29.9 mg/dL — ABNORMAL HIGH (ref 7.0–26.0)
CO2: 28 meq/L (ref 22–29)
CREATININE: 1.7 mg/dL — AB (ref 0.6–1.1)
Calcium: 9.2 mg/dL (ref 8.4–10.4)
Chloride: 103 mEq/L (ref 98–109)
EGFR: 37 mL/min/{1.73_m2} — AB (ref >90)
GLUCOSE: 110 mg/dL (ref 70–140)
Potassium: 3.6 mEq/L (ref 3.5–5.1)
Sodium: 142 mEq/L (ref 136–145)
Total Protein: 7 g/dL (ref 6.4–8.3)

## 2016-11-26 NOTE — Telephone Encounter (Signed)
Unable to leave message - scheduled appt per 5/10 los. - Sent letter with appt date and time. [facility] to contact patient with Ct schedule.

## 2016-11-26 NOTE — Patient Instructions (Signed)
Thank you for choosing [facility] to provide your oncology and hematology care.  To afford each patient quality time with our providers, please arrive 30 minutes before your scheduled appointment time.  If you arrive late for your appointment, you may be asked to reschedule.  We strive to give you quality time with our providers, and arriving late affects you and other patients whose appointments are after yours.  If you are a no show for multiple scheduled visits, you may be dismissed from the clinic at the providers discretion.   Again, thank you for choosing [facility], our hope is that these requests will decrease the amount of time that you wait before being seen by our physicians.  ______________________________________________________________________ Should you have questions after your visit to the [facility], please contact our office at [phone] between the hours of 8:30 and 4:30 p.m.    Voicemails left after 4:30p.m will not be returned until the following business day.   For prescription refill requests, please have your pharmacy contact us directly.  Please also try to allow 48 hours for prescription requests.   Please contact the scheduling department for questions regarding scheduling.  For scheduling of procedures such as PET scans, CT scans, MRI, Ultrasound, etc please contact central scheduling at [phone].   Resources For Cancer Patients and Caregivers:  American Cancer Society:  800-227-2345  Can help patients locate various types of support and financial assistance Cancer Care: 1-800-813-HOPE (4673) Provides financial assistance, online support groups, medication/co-pay assistance.   [place] DSS:  336-641-3447 Where to apply for food stamps, Medicaid, and utility assistance Medicare Rights Center: 800-333-4114 Helps people with Medicare understand their rights and benefits, navigate the Medicare system, and secure the  quality healthcare they deserve SCAT: 336-333-6589 [place] [employer]'s shared-ride transportation service for eligible riders who have a disability that prevents them from riding the fixed route bus.   For additional information on assistance programs please contact our social worker:   Grier Hock/Abigail Elmore:  [phone] 

## 2016-11-27 ENCOUNTER — Encounter: Payer: Self-pay | Admitting: Podiatry

## 2016-11-27 ENCOUNTER — Ambulatory Visit (INDEPENDENT_AMBULATORY_CARE_PROVIDER_SITE_OTHER): Payer: Medicare Other | Admitting: Podiatry

## 2016-11-27 VITALS — BP 122/66 | HR 62

## 2016-11-27 DIAGNOSIS — M779 Enthesopathy, unspecified: Secondary | ICD-10-CM | POA: Diagnosis not present

## 2016-11-27 DIAGNOSIS — M2142 Flat foot [pes planus] (acquired), left foot: Secondary | ICD-10-CM

## 2016-11-27 DIAGNOSIS — M7741 Metatarsalgia, right foot: Secondary | ICD-10-CM | POA: Diagnosis not present

## 2016-11-27 DIAGNOSIS — M7742 Metatarsalgia, left foot: Secondary | ICD-10-CM

## 2016-11-27 DIAGNOSIS — M2141 Flat foot [pes planus] (acquired), right foot: Secondary | ICD-10-CM

## 2016-11-27 DIAGNOSIS — M79674 Pain in right toe(s): Secondary | ICD-10-CM

## 2016-11-27 DIAGNOSIS — B351 Tinea unguium: Secondary | ICD-10-CM

## 2016-11-27 DIAGNOSIS — M79675 Pain in left toe(s): Secondary | ICD-10-CM

## 2016-11-27 NOTE — Progress Notes (Signed)
   Subjective:    Patient ID: Pamela [name], female    DOB: 1951/01/19, 66 y.o.   MRN: [id number]  HPI 66 year old female presents the office today for concerns of thick, painful, elongated toenails that she cannot trim herself. Denies any redness or swelling on the toenail sites. She also has other complaints today of painful bottoms of her feet and she points the Achilles tendon as well. She also has pain in the ball of her feet. She denies any recent injury or trauma denies any numbness or tingling. This has been ongoing for about 6 months. It is worse after she walks or stands for some time. She said no recent treatment for. She has no other complaints today.   Review of Systems  Respiratory: Positive for shortness of breath.   Musculoskeletal: Positive for gait problem.  All other systems reviewed and are negative.      Objective:   Physical Exam General: AAO x3, NAD; obese.   Dermatological: Nails are hypertrophic, dystrophic, brittle, discolored, elongated 10. No surrounding redness or drainage. Tenderness nails 1-5 bilaterally. No open lesions or pre-ulcerative lesions are identified today.  Vascular: DP pulse 2/4, PT pulse 1/4 (likely due to chronic edema). There is no pain with calf compression, swelling, warmth, erythema.   Neruologic: Sensation appears intact with Derrel Nip monofilament.  Musculoskeletal: There is a significant decrease in medial arch upon weight. Equinus is present. Typically there is tenderness when I palpate the medial arch of the foot on the plantar fascia. Also there is prominence the metatarsal heads and there is mild diffuse tenderness submetatarsal 1-5 bilaterally. There is no area of significant pinpoint lasers pain vibratory sensation. Hammertoes are present as well. No other areas of tenderness identified. MMT 5/5.  Gait: Unassisted, Nonantalgic.      Assessment & Plan:  66 year old female with symptomatic onychomycosis, bilateral foot  pain likely biomechanical in nature -Treatment options discussed including all alternatives, risks, and complications -Etiology of symptoms were discussed  1. Symptomatic onychomycosis -Nails debrided 10 without complications or bleeding. -Daily foot inspection -Follow-up in 3 months or sooner if any problems arise. In the meantime, encouraged to call the office with any questions, concerns, change in symptoms.   2. Bilateral foot pain; due to flatfoot, metatarsalgia, equinus -Recommend stretching exercises daily. -Night splint dispensed -Metatarsal offloading pads. -Discussed shoe modifications follow-up orthotics. -Ice to the area as needed. -Follow-up in 3-4 weeks symptoms not improving to call the office before.   Celesta Gentile, DPM

## 2016-11-27 NOTE — Patient Instructions (Signed)

## 2016-11-28 NOTE — Progress Notes (Signed)
[name]    HEMATOLOGY/ONCOLOGY CLINIC NOTE  Date of Service: 11/26/2016   Patient Care Team: Dorena Dew, FNP as PCP - General (Family Medicine)  CHIEF COMPLAINTS/PURPOSE OF CONSULTATION:  Followup of lung cancer  DIAGNOSIS 1. Right upper lobe T1b, N0, M0 primary lung adenocarcinoma diagnosed in December 2012. She was deemed not to be a surgical candidate. Treated by radiation oncology with SBRT at [facility]. Completed treatment in March 2013 and has been monitored since with no evidence of recurrence based on PET/CT scan done on 05/20/2015.  2.  Right anterior chest wall slowly growing masslike lesion in the right pectoralis minor muscle. Unclear etiology. Biopsy was done on 12/14/2013 at the [name]. FNA showed skeletal muscles, macrophages and spindle cells some with cytologic atypia. Needle core biopsy showed soft tissue and skeletal muscle with extensive necrosis; rare spindle cells with cytologic atypia. Treatment effect causing reactive atypia cannot be ruled out. Clinical correlation was advised to ensure the sample is representative. PET/CT scan done here on 05/20/2015 shows no change in metabolic activity or size of the medial right chest wall mass suggesting benign etiology.  INTERVAL HISTORY  Pamela [name] is here for her scheduled 6 month follow-up.  She notes that she is doing well overall and has no new acute concerns.  No new bone pains.  Breathing is stable and oxygen requirement has reduced to [place] 1L/min and is currently saturating 98% of [place] 1L/min. She continues to follow with pulmonology/Dr Melvyn Novas for management of her sarcoidosis and is on tapering dose of Prednisone. Is trying to lose weight and hopes to get off continuous oxygen so she can travel more easily.  MEDICAL HISTORY:  Past Medical History:  Diagnosis Date  . Arthritis   . Asthma   . Cancer ([place])    lung, adenocarcinoma right lung 2012  . CHF (congestive heart failure) (HCC)    Preserved EF  . Congenital single kidney    With chronic kidney disease  . COPD (chronic obstructive pulmonary disease) ([place])   . Gout   . Hypertension   . Lung cancer [facility]) 2012   Right upper lobe lung adenocarcinoma diagnosed with needle biopsy treated by SBRT finished treatment April 2013 has been monitored since  . Mass of chest wall, right    Right chest wall mass 7.3 cm biopsy on 12/13/2013. Patient notes it was consistent with sarcoidosis but actual pathology results not available.  . Oxygen deficiency   . Sarcoidosis   . Sickle cell trait ([place])   . Sleep apnea    CKD ([place] 11/21/2015 showed no urinary tract abnormalities.) Gout on [place] - tea as a trigger.  SURGICAL HISTORY: Past Surgical History:  Procedure Laterality Date  . LUNG BIOPSY    . TUBAL LIGATION    . VEIN LIGATION AND STRIPPING      SOCIAL HISTORY: Social History   Social History  . Marital status: Divorced    Spouse name: N/A  . Number of children: 2  . Years of education: N/A   Occupational History  . Not on file.   Social History Main Topics  . Smoking status: Never Smoker  . Smokeless tobacco: Never Used  . Alcohol use No  . Drug use: No  . Sexual activity: Not on file   Other Topics Concern  . Not on file   Social History Narrative   Lives with son.  One son is deceased.      FAMILY HISTORY: Family History  Problem Relation Age of Onset  . Hypertension Mother   . Renal Disease Mother   . Cancer Father   . Heart disease Father        No details  . Cancer Brother   . Diabetes Brother   . Cervical cancer Sister   . Diabetes Sister   . Multiple myeloma Sister     ALLERGIES:  is allergic to sulfur.  MEDICATIONS:  Current Outpatient Prescriptions  Medication Sig Dispense Refill  . acetaminophen (TYLENOL) 500 MG tablet Take 1 tablet (500 mg total) by mouth every 6 (six) hours as needed. (Patient taking differently: Take 500 mg by mouth every 6 (six) hours as needed for moderate  pain. ) 30 tablet 2  . albuterol (PROAIR HFA) 108 (90 Base) MCG/ACT inhaler Inhale 2 puffs into the lungs every 6 (six) hours as needed for wheezing or shortness of breath. 1 Inhaler 3  . albuterol (PROVENTIL) (2.5 MG/3ML) 0.083% nebulizer solution Take 3 mLs (2.5 mg total) by nebulization every 6 (six) hours as needed for wheezing or shortness of breath. 75 mL 12  . aspirin 81 MG tablet Take 1 tablet (81 mg total) by mouth daily. 90 tablet 5  . budesonide-formoterol (SYMBICORT) 160-4.5 MCG/ACT inhaler Inhale 2 puffs into the lungs 2 (two) times daily. 1 Inhaler 5  . Calcium Carbonate-Vitamin D (CALTRATE 600+D) 600-400 MG-UNIT tablet Take 1 tablet by mouth daily. (Patient taking differently: Take 1 tablet by mouth every evening. ) 90 tablet 5  . docusate sodium (COLACE) 100 MG capsule Take 1 capsule (100 mg total) by mouth daily. 30 capsule 0  . febuxostat (ULORIC) 40 MG tablet Take 1 tablet (40 mg total) by mouth daily. 90 tablet 5  . furosemide (LASIX) 40 MG tablet Take 1 tablet (40 mg total) by mouth 2 (two) times daily. 180 tablet 5  . OXYGEN 2lpm 24/7  AHC    . potassium chloride (K-DUR) 10 MEQ tablet Take 1 tablet (10 mEq total) by mouth daily. 90 tablet 1  . predniSONE (DELTASONE) 10 MG tablet Take 1 tablet (10 mg total) by mouth daily with breakfast. (Patient taking differently: Take 5 mg by mouth daily with breakfast. ) 30 tablet 5   No current facility-administered medications for this visit.     REVIEW OF SYSTEMS:    10 Point review of Systems was done is negative except as noted above.  PHYSICAL EXAMINATION: ECOG PERFORMANCE STATUS: 2-3  . Vitals:   11/26/16 0909  BP: (!) 129/49  Pulse: 72  Resp: 18  Temp: 98.6 F (37 C)   Filed Weights   11/26/16 0909  Weight: (!) 370 lb 7 oz (168 kg)   .Body mass index is 59.79 kg/m.   . Wt Readings from Last 3 Encounters:  11/26/16 (!) 370 lb 7 oz (168 kg)  11/03/16 (!) 367 lb (166.5 kg)  10/15/16 (!) 366 lb (166 kg)    GENERAL:alert,  morbidly obese female in no acute distress and comfortable SKIN: skin color, texture, turgor are normal, no rashes or significant lesions EYES: normal, conjunctiva are pink and non-injected, sclera clear OROPHARYNX:no exudate, no erythema and lips, buccal mucosa, and tongue normal  NECK: supple, no JVD, thyroid normal size, non-tender, without nodularity LYMPH:  no palpable lymphadenopathy in the cervical, axillary or inguinal LUNG: Distant breath sounds, no rales, no rhonchi. HEART: regular rate & rhythm,  no murmurs and no lower extremity edema ABDOMEN: abdomen soft, non-tender, normoactive bowel sounds  Musculoskeletal: no cyanosis of digits  and no clubbing  PSYCH: alert & oriented x 3 with fluent speech NEURO: no focal motor/sensory deficits  LABORATORY DATA:  I have reviewed the data as listed  . CBC Latest Ref Rng & Units 11/26/2016 08/04/2016 05/26/2016  WBC 3.9 - 10.3 10e3/uL 5.5 4.6 6.9  Hemoglobin 11.6 - 15.9 g/dL 11.6 10.6(L) 11.6  Hematocrit 34.8 - 46.6 % 36.9 35.1 37.3  Platelets 145 - 400 10e3/uL 149 167 156   . CBC    Component Value Date/Time   WBC 5.5 11/26/2016 0808   WBC 4.6 08/04/2016 1102   RBC 5.14 11/26/2016 0808   RBC 4.86 08/04/2016 1102   HGB 11.6 11/26/2016 0808   HCT 36.9 11/26/2016 0808   PLT 149 11/26/2016 0808   MCV 71.8 (L) 11/26/2016 0808   MCH 22.6 (L) 11/26/2016 0808   MCH 21.8 (L) 08/04/2016 1102   MCHC 31.4 (L) 11/26/2016 0808   MCHC 30.2 (L) 08/04/2016 1102   RDW 14.8 (H) 11/26/2016 0808   LYMPHSABS 1.0 11/26/2016 0808   MONOABS 0.4 11/26/2016 0808   EOSABS 0.7 (H) 11/26/2016 0808   BASOSABS 0.0 11/26/2016 0808     . CMP Latest Ref Rng & Units 11/26/2016 11/03/2016 08/04/2016  Glucose 70 - 140 mg/dl 110 92 96  BUN 7.0 - 26.0 mg/dL 29.9(H) 25 30(H)  Creatinine 0.6 - 1.1 mg/dL 1.7(H) 1.67(H) 1.59(H)  Sodium 136 - 145 mEq/L 142 143 143  Potassium 3.5 - 5.1 mEq/L 3.6 4.2 4.3  Chloride 98 - 110 mmol/L - 101 102  CO2  22 - 29 mEq/L '28 24 31  ' Calcium 8.4 - 10.4 mg/dL 9.2 8.9 9.0  Total Protein 6.4 - 8.3 g/dL 7.0 - -  Total Bilirubin 0.20 - 1.20 mg/dL 0.50 - -  Alkaline Phos 40 - 150 U/L 144 - -  AST 5 - 34 U/L 19 - -  ALT 0 - 55 U/L 16 - -    RADIOGRAPHIC STUDIES: I have personally reviewed the radiological images as listed and agreed with the findings in the report. No results found. ASSESSMENT & PLAN:   66 year old African-American female with   #1 history of right upper lobe T1b, N0, M0 primary lung adenocarcinoma most in December 2012. She was deemed not to be a surgical candidate. Treated by radiation oncology with SBRT at [facility]. Completed treatment in March 2013 and has been monitored since with no evidence of recurrence. no evidence of recurrence based on PET/CT scan done on 05/20/2015. On clinical visit today the patient has no new change in her breathing and no new focal symptoms.  Hemoglobin is stable.   CT chest 05/26/2016 shows no evidence of local recurrence of cancer or metastatic disease. Plan -No overt clinical symptoms suggestive of recurrent lung cancer at this time. -We will plan to follow-up the patient in 6 months for an H&P and repeat labs and repeat a CT chest in 6 months unless any new symptoms arise.  #2 Right anterior chest wall slowly growing masslike lesion in the right pectoralis minor muscle. Unclear etiology. FNA showed skeletal muscles, macrophages and spindle cells some with cytologic atypia. Needle core biopsy showed soft tissue and skeletal muscle with extensive necrosis; rare spindle cells with cytologic atypia. Treatment effect causing reactive atypia cannot be ruled out.  PET/CT scan done here on 05/20/2015 shows no change in metabolic activity or size of the medial right chest wall mass suggesting benign etiology. Could certainly be related to her sarcoidosis . Previously discussed with  the patient the option of biopsying the mass for a more definitive  diagnosis versus monitoring it. She chooses to monitor it at this time given that imaging findings suggest a likely benign etiology that hasnt changed over the last 18 months. CT chest 05/26/2016 shows that this lesion is stable compared to previous imaging.  #3 history of extensive sarcoidosis involving lymphadenopathy in the chest, abdomen as well as spleen and possibly liver .previously treated with steroids by Dr. Bartholome Bill, pulmonary at [facility] .has recently established care with Dr. Melvyn Novas for her pulmonary and sarcoidosis management.   Plan -Continue followup with Dr.[name] For further management  #5 history of sickle cell trait. Hemoglobin electrophoresis is not available. Hgb Improved from 9.9 to 11.3. Microcytosis likely from chronic disease from her sarcoidosis vs possible alpha thal trait. Ferritin levels WNL Plan  -No indication for additional iron replacement at this time.  CT chest in 6 months RTC with Dr Irene Limbo in 6 months with CT chest and labs   All of the patients questions were answered  to her apparent satisfaction. The patient knows to call the clinic with any problems, questions or concerns.  I spent 20 minutes counseling the patient face to face. The total time spent in the appointment was 20 minutes and more than 50% was on counseling and direct patient cares.    Sullivan Lone MD [place] AAHIVMS [facility] Hematology/Oncology Physician [facility]  (Office):       [phone] (Work cell):  [phone] (Fax):           [phone]

## 2016-12-03 ENCOUNTER — Encounter: Payer: Self-pay | Admitting: Nurse Practitioner

## 2016-12-09 ENCOUNTER — Ambulatory Visit (INDEPENDENT_AMBULATORY_CARE_PROVIDER_SITE_OTHER): Payer: Medicare Other | Admitting: Nurse Practitioner

## 2016-12-09 ENCOUNTER — Encounter: Payer: Self-pay | Admitting: Nurse Practitioner

## 2016-12-09 VITALS — BP 124/70 | HR 68 | Ht 66.0 in | Wt 366.4 lb

## 2016-12-09 DIAGNOSIS — Z1211 Encounter for screening for malignant neoplasm of colon: Secondary | ICD-10-CM

## 2016-12-09 NOTE — Patient Instructions (Signed)
If you are age 64 or older, your body mass index should be between 23-30. Your Body mass index is 59.13 kg/m. If this is out of the aforementioned range listed, please consider follow up with your Primary Care Provider.  If you are age 64 or younger, your body mass index should be between 19-25. Your Body mass index is 59.13 kg/m. If this is out of the aformentioned range listed, please consider follow up with your Primary Care Provider.   Your provider has ordered Cologuard testing as an option for colon cancer screening. This is performed by [employer] and may be out of network with your insurance. PRIOR to completing the test, it is YOUR responsibility to contact your insurance about covered benefits for this test. Your out of pocket expense could be anywhere from $0.00 to $649.00.   When you call to check coverage with your insurer, please provide the following information:   -The ONLY provider of Cologuard is [place] code for Cologuard is [phone].  [employer] NPI # [id number]  -[employer] Tax ID # [id number]   We have already sent your demographic and insurance information to [employer] (phone number [phone]) and they should contact you within the next week regarding your test. If you have not heard from them within the next week, please call our office at [phone].  Will call when we get colon report from [facility].  Thank you for choosing me and [facility].  Tye Savoy, NP

## 2016-12-09 NOTE — Progress Notes (Addendum)
HPI: Pamela [name] 66 year old female referred by PCP Cammie Sickle, FNP for colon cancer screening. She gives a history of congestive heart failure, CKD, lung cancer status post radiation, COPD, sarcoidosis, morbid obesity and she is on home oxygen.  Patient had a colonoscopy in [place] she thinks maybe 5 years ago. Told she needed a repeat exam in 5 years and is here to discuss. She has no gastrointestinal complaints. Specifically, no bowel changes, blood in stool, or abdominal pain. No family history of colon cancer .  She has chronic, stable mild microcytic anemia. Ferritin levels have been consistently in 250 range.    Past Medical History:  Diagnosis Date  . Arthritis   . Asthma   . Cancer ([place])    lung, adenocarcinoma right lung 2012  . CHF (congestive heart failure) (HCC)    Preserved EF  . Congenital single kidney    With chronic kidney disease  . COPD (chronic obstructive pulmonary disease) ([place])   . Gout   . Hypertension   . Lung cancer [facility]) 2012   Right upper lobe lung adenocarcinoma diagnosed with needle biopsy treated by SBRT finished treatment April 2013 has been monitored since  . Mass of chest wall, right    Right chest wall mass 7.3 cm biopsy on 12/13/2013. Patient notes it was consistent with sarcoidosis but actual pathology results not available.  . Oxygen deficiency   . Sarcoidosis   . Sickle cell trait ([place])   . Sleep apnea      Past Surgical History:  Procedure Laterality Date  . LUNG BIOPSY    . TUBAL LIGATION    . VEIN LIGATION AND STRIPPING     Family History  Problem Relation Age of Onset  . Hypertension Mother   . Renal Disease Mother   . Cancer Father        stomach  . Heart disease Father        No details  . Cancer Brother   . Diabetes Brother   . Cervical cancer Sister   . Diabetes Sister   . Multiple myeloma Sister    Social History  Substance Use Topics  . Smoking status: Never Smoker  . Smokeless tobacco: Never  Used  . Alcohol use No   Current Outpatient Prescriptions  Medication Sig Dispense Refill  . acetaminophen (TYLENOL) 500 MG tablet Take 1 tablet (500 mg total) by mouth every 6 (six) hours as needed. (Patient taking differently: Take 500 mg by mouth every 6 (six) hours as needed for moderate pain. ) 30 tablet 2  . albuterol (PROAIR HFA) 108 (90 Base) MCG/ACT inhaler Inhale 2 puffs into the lungs every 6 (six) hours as needed for wheezing or shortness of breath. 1 Inhaler 3  . albuterol (PROVENTIL) (2.5 MG/3ML) 0.083% nebulizer solution Take 3 mLs (2.5 mg total) by nebulization every 6 (six) hours as needed for wheezing or shortness of breath. 75 mL 12  . aspirin 81 MG tablet Take 1 tablet (81 mg total) by mouth daily. 90 tablet 5  . budesonide-formoterol (SYMBICORT) 160-4.5 MCG/ACT inhaler Inhale 2 puffs into the lungs 2 (two) times daily. 1 Inhaler 5  . Calcium Carbonate-Vitamin D (CALTRATE 600+D) 600-400 MG-UNIT tablet Take 1 tablet by mouth daily. (Patient taking differently: Take 1 tablet by mouth every evening. ) 90 tablet 5  . docusate sodium (COLACE) 100 MG capsule Take 1 capsule (100 mg total) by mouth daily. 30 capsule 0  . febuxostat (ULORIC) 40 MG tablet  Take 1 tablet (40 mg total) by mouth daily. 90 tablet 5  . furosemide (LASIX) 40 MG tablet Take 1 tablet (40 mg total) by mouth 2 (two) times daily. 180 tablet 5  . OXYGEN 2lpm 24/7  A[facility]    . potassium chloride (K-DUR) 10 MEQ tablet Take 1 tablet (10 mEq total) by mouth daily. 90 tablet 1  . predniSONE (DELTASONE) 10 MG tablet Take 1 tablet (10 mg total) by mouth daily with breakfast. (Patient taking differently: Take 5 mg by mouth daily with breakfast. ) 30 tablet 5   No current facility-administered medications for this visit.    Allergies  Allergen Reactions  . Sulfur Rash     Review of Systems: Positives: Arthritis, swelling of feet and legs.  All other systems reviewed and negative except where noted in HPI.     Physical Exam: BP 124/70   Pulse 68   Ht '5\' 6"'  (1.676 m)   Wt (!) 366 lb 6 oz (166.2 kg)   BMI 59.13 kg/m  Constitutional:  Morbidly obese black female in no acute distress. Psychiatric: Normal mood and affect. Behavior is normal. EENT:  Conjunctivae are normal. No scleral icterus. Neck supple.  Cardiovascular: Normal rate, regular rhythm.  Pulmonary/chest: Effort normal and breath sounds normal. No wheezing, rales or rhonchi. Abdominal: limited exam, didn't get on exam table. Abdomen soft, nontender. Bowel sounds active throughout.  Extremities: 2+ edema. Bilateral LE skin changes (dark , thickened) Lymphadenopathy: No cervical adenopathy noted. Neurological: Alert and oriented to person place and time. Skin: Skin is warm and dry. No rashes noted.   ASSESSMENT AND PLAN:  1. Pamela [name] 66 year old female here for colon cancer screening. Patient had a colonoscopy in [place] but cannot remember when, maybe 5 years ago.  Follow-up colonoscopy recommended at 5 years but patient does not have records and can't remember why the 5 year follow up. She has no family history of colon cancer. Currently, she has no GI symptoms such as weight loss, bowel changes, abdominal pain or blood in stool. She has chronic, stable microcytic anemia but ferritin levels have been normal. Anemia may be multifactorial with CKD contributing.  -We are trying to obtain colonoscopy reports from [facility]. She has multiple medical problems including morbidly obesity, COPD/ sarcoidosis  (on home oxygen) increasing her risk for procedures. Furthermore it is unknown whether she is due for a follow up colonoscopy. Given circumstances It is reasonable to proceed with a Cologuard for now. If positive then will proceed with colonoscopy to be done at the Hospital given her BMI.   2. Morbid obesity / CKD2 / sarcoidosis / COPD / Home 02 / hx of heart failure / hx of lung cancer    Tye Savoy, NP   12/09/2016, 9:43 AM  Cc:  Cammie Sickle, FNP  Addendum: Reviewed and agree with initial management. Pyrtle, Lajuan Lines, MD

## 2016-12-21 DIAGNOSIS — Z1211 Encounter for screening for malignant neoplasm of colon: Secondary | ICD-10-CM | POA: Diagnosis not present

## 2016-12-21 DIAGNOSIS — Z1212 Encounter for screening for malignant neoplasm of rectum: Secondary | ICD-10-CM | POA: Diagnosis not present

## 2017-01-01 ENCOUNTER — Other Ambulatory Visit: Payer: Self-pay

## 2017-01-01 LAB — COLOGUARD: COLOGUARD: NEGATIVE

## 2017-01-08 ENCOUNTER — Ambulatory Visit (INDEPENDENT_AMBULATORY_CARE_PROVIDER_SITE_OTHER): Payer: Medicare Other | Admitting: Podiatry

## 2017-01-08 ENCOUNTER — Encounter: Payer: Self-pay | Admitting: Podiatry

## 2017-01-08 DIAGNOSIS — M7742 Metatarsalgia, left foot: Secondary | ICD-10-CM

## 2017-01-08 DIAGNOSIS — M2142 Flat foot [pes planus] (acquired), left foot: Secondary | ICD-10-CM | POA: Diagnosis not present

## 2017-01-08 DIAGNOSIS — M7741 Metatarsalgia, right foot: Secondary | ICD-10-CM

## 2017-01-08 DIAGNOSIS — M2141 Flat foot [pes planus] (acquired), right foot: Secondary | ICD-10-CM | POA: Diagnosis not present

## 2017-01-10 NOTE — Progress Notes (Signed)
Subjective: 66 year old female presents the office they for follow-up evaluation of bilateral foot pain. She states that since last Wednesday she did get over-the-counter inserts and this is helped quite a bit and she's having no pain to her feet and she states that she is very happy. She has no concerns today. Denies any systemic complaints such as fevers, chills, nausea, vomiting. No acute changes since last appointment, and no other complaints at this time.   Objective: AAO x3, NAD DP/PT pulses palpable bilaterally, CRT less than 3 seconds There is a decrease in medial arch height upon WB. There is no area pinpoint bony tenderness and there is no pain the vibratory sensation. There is no area of tenderness elicited to bilateral lower extremities. There is no overlying edema, erythema, increase in warmth. No open lesions or pre-ulcerative lesions.  No pain with calf compression, swelling, warmth, erythema  Assessment: Resolved bilateral foot pain  Plan: -All treatment options discussed with the patient including all alternatives, risks, complications.  -Continue with shoe gear modifications and orthotics. Discussed general stretching, rehabilitation exercises for equinus as well as strengthening her legs/feet. -RTC in 3 months for routine care or sooner if needed.  -Patient encouraged to call the office with any questions, concerns, change in symptoms.   Celesta Gentile, DPM

## 2017-01-18 ENCOUNTER — Encounter: Payer: Medicare Other | Admitting: Adult Health

## 2017-02-16 ENCOUNTER — Encounter: Payer: Self-pay | Admitting: Family Medicine

## 2017-02-16 ENCOUNTER — Ambulatory Visit (INDEPENDENT_AMBULATORY_CARE_PROVIDER_SITE_OTHER): Payer: Medicare Other | Admitting: Family Medicine

## 2017-02-16 VITALS — BP 119/58 | HR 68 | Temp 98.2°F | Resp 16 | Ht 66.0 in | Wt 361.0 lb

## 2017-02-16 DIAGNOSIS — Z9981 Dependence on supplemental oxygen: Secondary | ICD-10-CM

## 2017-02-16 DIAGNOSIS — D869 Sarcoidosis, unspecified: Secondary | ICD-10-CM

## 2017-02-16 DIAGNOSIS — R7303 Prediabetes: Secondary | ICD-10-CM

## 2017-02-16 DIAGNOSIS — I10 Essential (primary) hypertension: Secondary | ICD-10-CM

## 2017-02-16 DIAGNOSIS — Z85118 Personal history of other malignant neoplasm of bronchus and lung: Secondary | ICD-10-CM

## 2017-02-16 LAB — POCT URINALYSIS DIP (DEVICE)
BILIRUBIN URINE: NEGATIVE
Glucose, UA: NEGATIVE mg/dL
HGB URINE DIPSTICK: NEGATIVE
KETONES UR: NEGATIVE mg/dL
Leukocytes, UA: NEGATIVE
Nitrite: NEGATIVE
PH: 7 (ref 5.0–8.0)
PROTEIN: NEGATIVE mg/dL
Specific Gravity, Urine: 1.015 (ref 1.005–1.030)
Urobilinogen, UA: 0.2 mg/dL (ref 0.0–1.0)

## 2017-02-16 LAB — POCT GLYCOSYLATED HEMOGLOBIN (HGB A1C): HEMOGLOBIN A1C: 5.5

## 2017-02-16 NOTE — Progress Notes (Signed)
Subjective:    Patient ID: Pamela [name], female    DOB: 03-30-1951, 66 y.o.   MRN: [id number]  HPI  Pamela [name] is a 66 year old female with history of chronic diastolic CHF, gouty arthritis, HTN, ,and sarcodosis that presents for a 3 month follow up of chronic conditions.  She states that she feels well and is without complaint. She states that she has not been following a lowfat, low sodium diet and has not increased her daily activity. She has been taking all medications consistently. She also has a history of chronic kidney disese and is followed by [facility].   She has recently followed up with Dr. Melvyn Novas, pulmonologist for sarcoidosis and history of asthma. She is evaluated by Dr. Irene Limbo, oncology every three months. Patient denies headache, chest pain, abdominal pain, dysuria, rectal bleeding, nausea, vomiting, diarrhea, or constipation. No new weakness tingling or numbness. She also denies cough, or shortness of breath.   Pamela [name] has severe obesity. She has been following a low sodium, carbohydrate modified, low calorie diet consistently over the past several months. She is unable to exercise due to history of sarcoidosis and chronic oxygen use. She has decreased weight by 6 pounds since previous appointment.    Past Medical History:  Diagnosis Date  . Arthritis   . Asthma   . Cancer ([place])    lung, adenocarcinoma right lung 2012  . CHF (congestive heart failure) (HCC)    Preserved EF  . Congenital single kidney    With chronic kidney disease  . COPD (chronic obstructive pulmonary disease) ([place])   . Gout   . Hypertension   . Lung cancer [facility]) 2012   Right upper lobe lung adenocarcinoma diagnosed with needle biopsy treated by SBRT finished treatment April 2013 has been monitored since  . Mass of chest wall, right    Right chest wall mass 7.3 cm biopsy on 12/13/2013. Patient notes it was consistent with sarcoidosis but actual pathology results not available.  .  Oxygen deficiency   . Sarcoidosis   . Sickle cell trait ([place])   . Sleep apnea    Social History   Social History  . Marital status: Divorced    Spouse name: N/A  . Number of children: 2  . Years of education: N/A   Social History Main Topics  . Smoking status: Never Smoker  . Smokeless tobacco: Never Used  . Alcohol use No  . Drug use: No  . Sexual activity: Not on file   Other Topics Concern  . Not on file   Social History Narrative   Lives with son.  One son is deceased.     Allergies  Allergen Reactions  . Sulfur Rash   Immunization History  Administered Date(s) Administered  . Influenza,inj,Quad PF,36+ Mos 04/13/2014, 04/16/2015, 04/03/2016  . Pneumococcal Conjugate-13 04/03/2016  . Pneumococcal Polysaccharide-23 04/13/2014  . Tdap 01/02/2015  . Zoster 05/21/2014   Review of Systems  Constitutional: Negative.  Negative for fatigue and fever.  HENT: Negative.   Eyes: Negative.  Negative for visual disturbance.  Respiratory: Negative for chest tightness and shortness of breath.   Cardiovascular: Negative.   Gastrointestinal: Negative.   Endocrine: Negative.  Negative for polydipsia, polyphagia and polyuria.  Genitourinary: Negative.   Musculoskeletal: Negative for arthralgias and myalgias.  Skin: Negative.   Allergic/Immunologic: Negative.   Neurological: Negative.  Negative for light-headedness and headaches.  Hematological: Negative.   Psychiatric/Behavioral: Negative.  Negative for suicidal ideas.  Objective:   Physical Exam  Constitutional: She is oriented to person, place, and time. Vital signs are normal. She appears well-developed and well-nourished. Nasal cannula in place.  Morbid obesity  HENT:  Head: Normocephalic and atraumatic.  Right Ear: Hearing, tympanic membrane, external ear and ear canal normal.  Left Ear: Hearing, tympanic membrane, external ear and ear canal normal.  Mouth/Throat: Uvula is midline and oropharynx is clear and  moist. She has dentures.  Eyes: Pupils are equal, round, and reactive to light. Conjunctivae and EOM are normal. Lids are everted and swept, no foreign bodies found.  Neck: Trachea normal and normal range of motion. Neck supple.  Cardiovascular: Normal rate, regular rhythm, S1 normal, S2 normal, normal heart sounds and intact distal pulses.   Bilateral lower extremity edema  Pulmonary/Chest: Effort normal and breath sounds normal.  Abdominal: Soft. Normal appearance and bowel sounds are normal.  Increased abdominal girth  Neurological: She is alert and oriented to person, place, and time. She has normal reflexes.  Skin: Skin is warm, dry and intact.  Hyperpigmentation to lower extremities   Psychiatric: She has a normal mood and affect. Her speech is normal and behavior is normal. Judgment and thought content normal. Cognition and memory are normal.      BP (!) 119/58 (BP Location: Right Arm, Patient Position: Sitting, Cuff Size: Large)   Pulse 68   Temp 98.2 F (36.8 C) (Oral)   Resp 16   Ht 5\' 6"  (1.676 m)   Wt (!) 361 lb (163.7 kg)   SpO2 99%   BMI 58.27 kg/m  Assessment & Plan:  1. Essential hypertension Blood pressure is at goal on current medication regimen.  Reviewed urinalysis, no protein - BASIC METABOLIC PANEL WITH GFR  2. Prediabetes  Recommend a lowfat, low carbohydrate diet divided over 5-6 small meals, increase water intake to 6-8 glasses, and 150 minutes per week of cardiovascular exercise.   - HgB A1c  3. History of lung cancer Pamela [name] is to follow up with Dr. Irene Limbo as scheduled  4. Severe obesity (BMI >= 40) (HCC) Continue to increase daily activity level. The patient is asked to make an attempt to improve diet and exercise patterns to aid in medical management of this problem.  Body mass index is 58.27 kg/m.   5. On home oxygen therapy Oxygen saturation is 98% on 2 Liters, no changes warranted  6. Sarcoidosis Pamela [name] is to follow up with Dr.  Melvyn Novas, pulmonology as scheduled   RTC: 3 months for chronic conditions   [place]  MSN, FNP-C [facility] [address] [place], [place] [address] [phone]

## 2017-02-16 NOTE — Patient Instructions (Signed)
Hypertension Hypertension, commonly called high blood pressure, is when the force of blood pumping through the arteries is too strong. The arteries are the blood vessels that carry blood from the heart throughout the body. Hypertension forces the heart to work harder to pump blood and may cause arteries to become narrow or stiff. Having untreated or uncontrolled hypertension can cause heart attacks, strokes, kidney disease, and other problems. A blood pressure reading consists of a higher number over a lower number. Ideally, your blood pressure should be below 120/80. The first ("top") number is called the systolic pressure. It is a measure of the pressure in your arteries as your heart beats. The second ("bottom") number is called the diastolic pressure. It is a measure of the pressure in your arteries as the heart relaxes. What are the causes? The cause of this condition is not known. What increases the risk? Some risk factors for high blood pressure are under your control. Others are not. Factors you can change  Smoking.  Having type 2 diabetes mellitus, high cholesterol, or both.  Not getting enough exercise or physical activity.  Being overweight.  Having too much fat, sugar, calories, or salt (sodium) in your diet.  Drinking too much alcohol. Factors that are difficult or impossible to change  Having chronic kidney disease.  Having a family history of high blood pressure.  Age. Risk increases with age.  Race. You may be at higher risk if you are African-American.  Gender. Men are at higher risk than women before age 45. After age 65, women are at higher risk than men.  Having obstructive sleep apnea.  Stress. What are the signs or symptoms? Extremely high blood pressure (hypertensive crisis) may cause:  Headache.  Anxiety.  Shortness of breath.  Nosebleed.  Nausea and vomiting.  Severe chest pain.  Jerky movements you cannot control (seizures).  How is this  diagnosed? This condition is diagnosed by measuring your blood pressure while you are seated, with your arm resting on a surface. The cuff of the blood pressure monitor will be placed directly against the skin of your upper arm at the level of your heart. It should be measured at least twice using the same arm. Certain conditions can cause a difference in blood pressure between your right and left arms. Certain factors can cause blood pressure readings to be lower or higher than normal (elevated) for a short period of time:  When your blood pressure is higher when you are in a health care provider's office than when you are at home, this is called white coat hypertension. Most people with this condition do not need medicines.  When your blood pressure is higher at home than when you are in a health care provider's office, this is called masked hypertension. Most people with this condition may need medicines to control blood pressure.  If you have a high blood pressure reading during one visit or you have normal blood pressure with other risk factors:  You may be asked to return on a different day to have your blood pressure checked again.  You may be asked to monitor your blood pressure at home for 1 week or longer.  If you are diagnosed with hypertension, you may have other blood or imaging tests to help your health care provider understand your overall risk for other conditions. How is this treated? This condition is treated by making healthy lifestyle changes, such as eating healthy foods, exercising more, and reducing your alcohol intake. Your   health care provider may prescribe medicine if lifestyle changes are not enough to get your blood pressure under control, and if:  Your systolic blood pressure is above 130.  Your diastolic blood pressure is above 80.  Your personal target blood pressure may vary depending on your medical conditions, your age, and other factors. Follow these  instructions at home: Eating and drinking  Eat a diet that is high in fiber and potassium, and low in sodium, added sugar, and fat. An example eating plan is called the DASH (Dietary Approaches to Stop Hypertension) diet. To eat this way: ? Eat plenty of fresh fruits and vegetables. Try to fill half of your plate at each meal with fruits and vegetables. ? Eat whole grains, such as whole wheat pasta, brown rice, or whole grain bread. Fill about one quarter of your plate with whole grains. ? Eat or drink low-fat dairy products, such as skim milk or low-fat yogurt. ? Avoid fatty cuts of meat, processed or cured meats, and poultry with skin. Fill about one quarter of your plate with lean proteins, such as fish, chicken without skin, beans, eggs, and tofu. ? Avoid premade and processed foods. These tend to be higher in sodium, added sugar, and fat.  Reduce your daily sodium intake. Most people with hypertension should eat less than 1,500 mg of sodium a day.  Limit alcohol intake to no more than 1 drink a day for nonpregnant women and 2 drinks a day for men. One drink equals 12 oz of beer, 5 oz of wine, or 1 oz of hard liquor. Lifestyle  Work with your health care provider to maintain a healthy body weight or to lose weight. Ask what an ideal weight is for you.  Get at least 30 minutes of exercise that causes your heart to beat faster (aerobic exercise) most days of the week. Activities may include walking, swimming, or biking.  Include exercise to strengthen your muscles (resistance exercise), such as pilates or lifting weights, as part of your weekly exercise routine. Try to do these types of exercises for 30 minutes at least 3 days a week.  Do not use any products that contain nicotine or tobacco, such as cigarettes and e-cigarettes. If you need help quitting, ask your health care provider.  Monitor your blood pressure at home as told by your health care provider.  Keep all follow-up visits as  told by your health care provider. This is important. Medicines  Take over-the-counter and prescription medicines only as told by your health care provider. Follow directions carefully. Blood pressure medicines must be taken as prescribed.  Do not skip doses of blood pressure medicine. Doing this puts you at risk for problems and can make the medicine less effective.  Ask your health care provider about side effects or reactions to medicines that you should watch for. Contact a health care provider if:  You think you are having a reaction to a medicine you are taking.  You have headaches that keep coming back (recurring).  You feel dizzy.  You have swelling in your ankles.  You have trouble with your vision. Get help right away if:  You develop a severe headache or confusion.  You have unusual weakness or numbness.  You feel faint.  You have severe pain in your chest or abdomen.  You vomit repeatedly.  You have trouble breathing. Summary  Hypertension is when the force of blood pumping through your arteries is too strong. If this condition is not   controlled, it may put you at risk for serious complications.  Your personal target blood pressure may vary depending on your medical conditions, your age, and other factors. For most people, a normal blood pressure is less than 120/80.  Hypertension is treated with lifestyle changes, medicines, or a combination of both. Lifestyle changes include weight loss, eating a healthy, low-sodium diet, exercising more, and limiting alcohol. This information is not intended to replace advice given to you by your health care provider. Make sure you discuss any questions you have with your health care provider. Document Released: 07/06/2005 Document Revised: 06/03/2016 Document Reviewed: 06/03/2016 Elsevier Interactive Patient Education  2018 [employer]. Prediabetes Prediabetes is the condition of having a blood sugar (blood glucose) level  that is higher than it should be, but not high enough for you to be diagnosed with type 2 diabetes. Having prediabetes puts you at risk for developing type 2 diabetes (type 2 diabetes mellitus). Prediabetes may be called impaired glucose tolerance or impaired fasting glucose. Prediabetes usually does not cause symptoms. Your health care provider can diagnose this condition with blood tests. You may be tested for prediabetes if you are overweight and if you have at least one other risk factor for prediabetes. Risk factors for prediabetes include:  Having a family member with type 2 diabetes.  Being overweight or obese.  Being older than age 14.  Being of American-Indian, African-American, Hispanic/Latino, or Asian/Pacific Islander descent.  Having an inactive (sedentary) lifestyle.  Having a history of gestational diabetes or polycystic ovarian syndrome (PCOS).  Having low levels of good cholesterol (HDL-C) or high levels of blood fats (triglycerides).  Having high blood pressure.  What is blood glucose and how is blood glucose measured?  Blood glucose refers to the amount of glucose in your bloodstream. Glucose comes from eating foods that contain sugars and starches (carbohydrates) that the body breaks down into glucose. Your blood glucose level may be measured in mg/dL (milligrams per deciliter) or mmol/L (millimoles per liter).Your blood glucose may be checked with one or more of the following blood tests:  A fasting blood glucose (FBG) test. You will not be allowed to eat (you will fast) for at least 8 hours before a blood sample is taken. ? A normal range for FBG is 70-100 mg/dl (3.9-5.6 mmol/L).  An A1c (hemoglobin A1c) blood test. This test provides information about blood glucose control over the previous 2?73months.  An oral glucose tolerance test (OGTT). This test measures your blood glucose twice: ? After fasting. This is your baseline level. ? Two hours after you drink a  beverage that contains glucose.  You may be diagnosed with prediabetes:  If your FBG is 100?125 mg/dL (5.6-6.9 mmol/L).  If your A1c level is 5.7?6.4%.  If your OGGT result is 140?199 mg/dL (7.8-11 mmol/L).  These blood tests may be repeated to confirm your diagnosis. What happens if blood glucose is too high? The pancreas produces a hormone (insulin) that helps move glucose from the bloodstream into cells. When cells in the body do not respond properly to insulin that the body makes (insulin resistance), excess glucose builds up in the blood instead of going into cells. As a result, high blood glucose (hyperglycemia) can develop, which can cause many complications. This is a symptom of prediabetes. What can happen if blood glucose stays higher than normal for a long time? Having high blood glucose for a long time is dangerous. Too much glucose in your blood can damage your nerves and  blood vessels. Long-term damage can lead to complications from diabetes, which may include:  Heart disease.  Stroke.  Blindness.  Kidney disease.  Depression.  Poor circulation in the feet and legs, which could lead to surgical removal (amputation) in severe cases.  How can prediabetes be prevented from turning into type 2 diabetes?  To help prevent type 2 diabetes, take the following actions:  Be physically active. ? Do moderate-intensity physical activity for at least 30 minutes on at least 5 days of the week, or as much as told by your health care provider. This could be brisk walking, biking, or water aerobics. ? Ask your health care provider what activities are safe for you. A mix of physical activities may be best, such as walking, swimming, cycling, and strength training.  Lose weight as told by your health care provider. ? Losing 5-7% of your body weight can reverse insulin resistance. ? Your health care provider can determine how much weight loss is best for you and can help you lose  weight safely.  Follow a healthy meal plan. This includes eating lean proteins, complex carbohydrates, fresh fruits and vegetables, low-fat dairy products, and healthy fats. ? Follow instructions from your health care provider about eating or drinking restrictions. ? Make an appointment to see a diet and nutrition specialist (registered dietitian) to help you create a healthy eating plan that is right for you.  Do not smoke or use any tobacco products, such as cigarettes, chewing tobacco, and e-cigarettes. If you need help quitting, ask your health care provider.  Take over-the-counter and prescription medicines as told by your health care provider. You may be prescribed medicines that help lower the risk of type 2 diabetes.  This information is not intended to replace advice given to you by your health care provider. Make sure you discuss any questions you have with your health care provider. Document Released: 10/28/2015 Document Revised: 12/12/2015 Document Reviewed: 08/27/2015 Elsevier Interactive Patient Education  [employer].

## 2017-02-17 LAB — BASIC METABOLIC PANEL WITH GFR
BUN: 25 mg/dL (ref 7–25)
CHLORIDE: 102 mmol/L (ref 98–110)
CO2: 27 mmol/L (ref 20–31)
CREATININE: 1.4 mg/dL — AB (ref 0.50–0.99)
Calcium: 8.8 mg/dL (ref 8.6–10.4)
GFR, Est African American: 46 mL/min — ABNORMAL LOW (ref >=60)
GFR, Est Non African American: 40 mL/min — ABNORMAL LOW (ref >=60)
Glucose, Bld: 93 mg/dL (ref 65–99)
Potassium: 4 mmol/L (ref 3.5–5.3)
Sodium: 142 mmol/L (ref 135–146)

## 2017-02-22 ENCOUNTER — Ambulatory Visit: Payer: Medicare Other | Admitting: Internal Medicine

## 2017-02-25 ENCOUNTER — Encounter: Payer: Self-pay | Admitting: Internal Medicine

## 2017-02-25 ENCOUNTER — Ambulatory Visit (INDEPENDENT_AMBULATORY_CARE_PROVIDER_SITE_OTHER): Payer: Medicare Other | Admitting: Internal Medicine

## 2017-02-25 VITALS — BP 112/80 | HR 78 | Ht 66.0 in | Wt 363.8 lb

## 2017-02-25 DIAGNOSIS — J9612 Chronic respiratory failure with hypercapnia: Secondary | ICD-10-CM

## 2017-02-25 DIAGNOSIS — J45909 Unspecified asthma, uncomplicated: Secondary | ICD-10-CM

## 2017-02-25 DIAGNOSIS — J9611 Chronic respiratory failure with hypoxia: Secondary | ICD-10-CM

## 2017-02-25 NOTE — Assessment & Plan Note (Addendum)
pfts 07/31/13  FEV1  1.53 (58%) with ratio 66 > p saba ratio 74 FEV1 1.68 (64%)  - -04/27/2014 p extensive coaching HFA effectiveness =    75% > try dulera 100 2 bid  - PFT's 07/19/2014 FEV1 2.00 (93%) and ratio 85 after 21% improvement from saba with no inhalers x one week  - trial off acei 12/01/2014 due to pseudoasthma component > resolved  - Spirometry 06/01/2016  FEV1 1.06 (48%)  Ratio 61  - FENO 06/01/2016  =   85 on symbicort 80 x 2 > increase to 160  - Prednisone 10 mg floor x years,  rec increase to 20 mg if needing neb    - 10/15/2016 reduced pred to 5 mg daily (gout flares with taper)  - 02/25/2017  After extensive coaching HFA effectiveness =   90% > pulmonary f/u is prn     I had an extended final summary discussion with the patient reviewing all relevant studies completed to date and  lasting 15 to 20 minutes of a 25 minute visit on the following issues:   If your breathing worsens or you need to use your rescue inhaler more than twice weekly or wake up more than twice a month with any respiratory symptoms or require more than two rescue inhalers per year, we need to see you right away because this means we're not controlling the underlying problem (inflammation) adequately.  Rescue inhalers do not control inflammation and overuse can lead to unnecessary and costly consequences.  They can make you feel better temporarily but eventually they will quit working effectively much as sleep aids lead to more insomnia if used regularly.   Unlikely to need to f/u here on symbicort 160 with good hfa technique, esp if still on pred for gout, but should she ever come saftely off pred and on symb 160 x 3 m then step down to 80 2bid would be appropriate  Pulmonary f/u is prn

## 2017-02-25 NOTE — Assessment & Plan Note (Signed)
Body mass index is 58.72 kg/m.  -  trending down slightly/ encouraged  Lab Results  Component Value Date   TSH 3.934 05/21/2014     Contributing to gerd risk/ doe/reviewed the need and the process to achieve and maintain neg calorie balance > defer f/u primary care including intermittently monitoring thyroid status

## 2017-02-25 NOTE — Assessment & Plan Note (Signed)
Started 2lpm 24/7 in [place] around 2013  See pfts 07/19/2014 with completely reversible airflow obst (so this is not copd) and a dlco of 71% so this is not ILD - [facility]   05/26/16  = 34 - [facility]    02/06/17  = 27   As of 02/25/2017  02 is prn / need should be eliminated completely with wt loss

## 2017-02-25 NOTE — Patient Instructions (Addendum)
Fine with if you leave off 02 but goal is to keep you over 90% saturated at all times    If you are satisfied with your treatment plan,  let your doctor know and he/she can either refill your medications or you can return here when your prescription runs out.     If in any way you are not 100% satisfied,  please tell us.  If 100% better, tell your friends!  Pulmonary follow up is as needed

## 2017-02-25 NOTE — Progress Notes (Signed)
Subjective:    Patient ID: Pamela [name], female    DOB: [birth year]  MRN: [id number]   Brief patient profile:  20 yobf never smoker with 1st asthma attack in 1990s and on maint rx since late 90's maint rx and freq courses of prednisone 3-4 x per year despite advair and spiriva and freq saba referred to pulmonary clinic 04/27/2014 by Dr [name] s/p CT Bx 06/25/11 > no path report but per oncology = [id number] adenoca of lung > RT only per pt RUL completed 09/2011     History of Present Illness  04/27/2014 1st [facility] office visit/ Pamela [name]   Chief Complaint  Patient presents with  . Pulmonary Consult    Referred by Dr. [name]. [name]  Pt recently moved to [place] from [name] and states needs to establish with new pulmonary MD.  She c/o DOE and cough, and states that she feels these symptoms are currently under control.    on prednisone 10 mg daily  since late July 2015 and still on freq saba and 2lpm  Already used   2puffs proair am of ov  rec Stop spiriva and advair Start dulera 100 Take 2 puffs first thing in am and then another 2 puffs about 12 hours later.  Work on inhaler technique:  relax and gently blow all the way out then take a nice smooth deep breath back in, triggering the inhaler at same time you start breathing in.  Hold for up to 5 seconds if you can.  Rinse and gargle with water when done Only use your albuterol (proair) as a rescue medication  If proair not helping, then use the neb and if needing the neb more than occastional, then take prednisone 10 mg daily  Please schedule a follow up office visit in 4 weeks, sooner if needed with all inhalers in hand for pfts on return   11/30/2014 f/u ov/Pamela [name] re: mild intermittent asthma ? Related to weather / still on acei plus on prednisone 10 mg daily for gout  Chief Complaint  Patient presents with  . Acute Visit    Pt c/o increased SOB for the past month. She is  also coughing and wheezing. Cough is prod with minimal yellow to clear sputum. She uses proair 2 x daily on average.   Also due for CT chest to f/u dx of lung ca dx March 2013  never surgery/ RT only  Onset of dry cough / wheeze was insidious/ pattern is progressively worse day >> noct with increasing  need for saba but very poor hfa  rec Stop lisinopril and start valsartan 80 mg one daily instead  Only use your albuterol as a rescue medication   Restart dulera 100 Take 2 puffs first thing in am and then another 2 puffs about 12 hours later until you only need  your rescue inhaler no more than twice weekly for cough/wheeze/ short of breath  Follow up with Dr [name] as planned and only see me if needed    06/06/2015  f/u ov/Pamela [name] re: worse since finished pred ? One week prior to [place]  / was taking for gout  Chief Complaint  Patient presents with  . Acute Visit    Increased SOB with or without exertion for the past 4 days. She also c/o cough and wheezing. Cough is prod with clear sputum.     Was doing much better while on prednisone  and gradually worse since off  Last used proventil about 7 am prior to OV  At 1130  On symbicort but hfa quite poor > see a/p Has neb but no meds for it  rec Prednisone Take 4 for three days 3 for three days 2 for three days 1 for three days and stop Work on inhaler technique:  relax and gently blow all the way out then take a nice smooth deep breath back in, triggering the inhaler at same time you start breathing in.  Hold for up to 5 seconds if you can. Blow out thru nose. Rinse and gargle with water when done Plan A = Automatic = symbicort 80 Take 2 puffs first thing in am and then another 2 puffs about 12 hours later.  Plan B = backup  Only use your albuterol(proair)   Plan C = crisis - only use nebulizer albuterol if you try the proair and it doesn't work       07/02/2015  f/u ov/Pamela [name] re: asthma vs uacs on pred 10 for gout and symb 80 2bid  and no saba    Chief Complaint  Patient presents with  . Follow-up    Breathing is much improved. She has not had to use rescue inhaler or neb.   Work on inhaler technique:  If start needing more albuterol for any reason  in any form return here asap    04/28/2016 NP  ER Follow up : Asthma /Sarcoid /Lung Cancer hx  Pt returns for ER follow up . Seen in ER on 04/08/16 for cough and dyspnea for 2 weeks.  She was treated with nebs and IV steroids. Discharged on prednisone 40mg  daily for 4 d.  BNP and troponin were nml. CXR without acute process.  She remains on Symbicort.. Rate use of albuterol.  Previous PFT 2015 with FEV1 93%, ratio 85, +BD response.  Is working on weight loss. Is down 40lbs since last ov.  Struggles with fluid retention, on Lasix 40mg  Twice daily  .  Has chronic LE swelling. She has CKD followed by [name].  Remains on o2 at 2l/m .  Split night sleep study in 07/2014 showed mild OSA only with AHI 6.9. O2 sats nml on 2l/m .  Over last 2 weeks has had to use albuterol 1 x day.  Since ER she is some better but not back to baseline. Still has intermittent wheezing but decreased .  No chest pain, orthopnea, edema or fever.  Flu shot is utd.  FENO today 04/28/2016  is 70  Moved here in 2015 From [name].  Dx with lung cancer 2012, tx with radiation 2013. At [facility].  Now sees Dr. Irene Limbo with Cancer center . Has CT scan due 05/26/16 . No evidence of recurrence based on PET/CT scan done on 05/20/2015. She is a never smoker but has second hand smoke exposure She also carries dx of Sarcoid from previous bx. (followed previously by Dr. Bartholome Bill at [facility], with lymphadenopathy in chest , abd w/ possible spleen and liver involvement.  She has right anterior chest wall slowly growing masslike lesion in the right pectoralis minor muscle. Unclear etiology with FNA. Followed on CT chest . She is on prednisone 10mg  daily for gout never less  rec Continue  on Symbicort 2 puffs Twice daily   Add Zyrtec 10mg  At bedtime  As needed  Drainage.  Add Pepcid 20mg  At bedtime  .  Increase prednisone 20mg  daily for  1 week then back to 10mg .     06/01/2016  f/u ov/Pamela [name] re: chronic asthma/sarcoid/ symb 80 2bid/ saba hfa and neb and pred back to 10 mg daily (floor)  Chief Complaint  Patient presents with  . Follow-up    Breathing has improved some since the last visit. She still wheezes occ wheezing. She has occ cough with min yellow sputum.  She is using albuterol inhaler 2 x per wk on average and she uses neb 3 x per wk on average.   Doe x room to room/ sleeping ok on 2lpm with hob elevation 30 degrees rec Plan A = Automatic = increase Symbicort to 160 Take 2 puffs first thing in am and then another 2 puffs about 12 hours later. (ok to use up your symbicort 80 if you run out of the 160 before next visit)  Work on inhaler technique:  relax and gently blow all the way out then take a nice smooth deep breath back in, triggering the inhaler at same time you start breathing in.  Hold for up to 5 seconds if you can. Blow out thru nose. Rinse and gargle with water when done Plan B = Backup Only use your albuterol as a rescue medication  Plan C = Crisis - only use your albuterol nebulizer if you first try Plan B and it fails to help      06/16/16  NP rec Follow med calendar closely and bring to each visit.  Continue on current regimen .     10/15/2016  f/u ov/Pamela [name] re:  Asthma/ no med calendar still maint on symb 160 2bid  Chief Complaint  Patient presents with  . Follow-up    Breathing is overall doing well and she denies any new co's.  She rarely uses albuterol inhaler or neb.   Not limited by breathing from desired activities  But very sedentary rec Continue pred 5 mg daily (for gout as the floor)  Follow med calendar     02/25/2017  f/u ov/Pamela [name] re:  Asthma/ no med calendar/ stopped all 02 x one week prior to [place] Complaint  Patient  presents with  . Pulmonary Consult    Breathing is doing "great". Not having to use albuterol inhaler or neb. She denies any new co's today.    Walking 20-30 min s 02  And says stats staying up  Actually wears the NP even though doesn't turn the 02 on "just in case" she might need it   No obvious day to day or daytime variability or assoc excess/ purulent sputum or mucus plugs or hemoptysis or cp or chest tightness, subjective wheeze or overt sinus or hb symptoms. No unusual exp hx or h/o childhood pna/ asthma or knowledge of premature birth.  Sleeping ok without nocturnal  or early am exacerbation  of respiratory  c/o's or need for noct saba. Also denies any obvious fluctuation of symptoms with weather or environmental changes or other aggravating or alleviating factors except as outlined above   Current Medications, Allergies, Complete Past Medical History, Past Surgical History, Family History, and Social History were reviewed in [employer] record.  ROS  The following are not active complaints unless bolded sore throat, dysphagia, dental problems, itching, sneezing,  nasal congestion or excess/ purulent secretions, ear ache,   fever, chills, sweats, unintended wt loss, classically pleuritic or exertional cp,  orthopnea pnd or leg swelling, presyncope, palpitations, abdominal pain, anorexia, nausea, vomiting, diarrhea  or change  in bowel or bladder habits, change in stools or urine, dysuria,hematuria,  rash, arthralgias, visual complaints, headache, numbness, weakness or ataxia or problems with walking or coordination,  change in mood/affect or memory.            Objective:   Physical Exam  amb massively obese  Bf  nad    11/30/2014  343 > 06/06/2015  355 > 07/03/2015  360 > 06/01/2016  358 > 10/15/2016  366 > 02/25/2017   364    Vital signs reviewed - Note on arrival 02 sats  100% RA     HEENT: nl urbinates, and orophanx. Nl external ear canals without cough reflex -  dentures in place    NECK :  without JVD/Nodes/TM/ nl carotid upstrokes bilaterally   LUNGS: no acc muscle use,   Clear to A and P  Though bs are distant   CV:  RRR  no s3 or murmur or increase in P2  , Trace sym  pitting edema, chronic venous insufficiency sym bilaterally    ABD:  Massively obese/ soft and nontender with limited excursion.    MS:  warm without deformities, calf tenderness, cyanosis or clubbing - walks very slow gait/ with cane  SKIN: warm and dry without lesions             I personally reviewed images and agree with radiology impression as follows:  CT w/o contrast  Chest     05/26/16 1. Stable CT of the chest. No evidence of local lung cancer recurrence or metastatic disease. 2. Medial right ventral chest wall mass is stable when compared with previous exam.

## 2017-03-09 DIAGNOSIS — H25013 Cortical age-related cataract, bilateral: Secondary | ICD-10-CM | POA: Diagnosis not present

## 2017-03-09 DIAGNOSIS — H2513 Age-related nuclear cataract, bilateral: Secondary | ICD-10-CM | POA: Diagnosis not present

## 2017-03-09 DIAGNOSIS — H35033 Hypertensive retinopathy, bilateral: Secondary | ICD-10-CM | POA: Diagnosis not present

## 2017-03-20 ENCOUNTER — Other Ambulatory Visit: Payer: Self-pay | Admitting: Family Medicine

## 2017-03-20 DIAGNOSIS — E876 Hypokalemia: Secondary | ICD-10-CM

## 2017-03-24 ENCOUNTER — Other Ambulatory Visit: Payer: Self-pay | Admitting: Family Medicine

## 2017-03-24 DIAGNOSIS — Z1231 Encounter for screening mammogram for malignant neoplasm of breast: Secondary | ICD-10-CM

## 2017-04-12 ENCOUNTER — Ambulatory Visit (INDEPENDENT_AMBULATORY_CARE_PROVIDER_SITE_OTHER): Payer: Medicare Other | Admitting: Podiatry

## 2017-04-12 ENCOUNTER — Encounter: Payer: Self-pay | Admitting: Podiatry

## 2017-04-12 DIAGNOSIS — B351 Tinea unguium: Secondary | ICD-10-CM

## 2017-04-12 DIAGNOSIS — M79675 Pain in left toe(s): Secondary | ICD-10-CM

## 2017-04-12 DIAGNOSIS — M79674 Pain in right toe(s): Secondary | ICD-10-CM

## 2017-04-12 DIAGNOSIS — M722 Plantar fascial fibromatosis: Secondary | ICD-10-CM

## 2017-04-12 NOTE — Patient Instructions (Signed)

## 2017-04-14 DIAGNOSIS — M722 Plantar fascial fibromatosis: Secondary | ICD-10-CM | POA: Insufficient documentation

## 2017-04-14 NOTE — Progress Notes (Signed)
Subjective: 66 y.o. returns the office today for painful, elongated, thickened toenails which she cannot trim herself. Denies any redness or drainage around the nails. Denies any acute changes since last appointment and no new complaints today. She states that she's been getting some pains vomiting or he'll intermittently. She denies any recent injury or trauma she denies he swelling. She denies any numbness or tingling she has no other concerns. Denies any systemic complaints such as fevers, chills, nausea, vomiting.   Objective: AAO 3, NAD DP 2/4, PT 1/4; chronic ankle swelling present bilaterally Nails hypertrophic, dystrophic, elongated, brittle, discolored 10. There is tenderness overlying the nails 1-5 bilaterally. There is no surrounding erythema or drainage along the nail sites. No open lesions or pre-ulcerative lesions are identified. There is mildenderness to palpation along the plantar medial tubercle of the calcaneus at the insertion of plantar fascia on the left foot. There is no pain along the course of the plantar fascia within the arch of the foot. Plantar fascia appears to be intact. There is no pain with lateral compression of the calcaneus or pain with vibratory sensation. There is no pain along the course or insertion of the achilles tendon. No other areas of tenderness to bilateral lower extremities. No other areas of tenderness bilateral lower extremities. No overlying edema, erythema, increased warmth. No pain with calf compression, swelling, warmth, erythema.  Assessment: Patient presents with symptomatic onychomycosis; left plantar fasciitis   Plan: -Treatment options including alternatives, risks, complications were discussed -Nails sharply debrided 10 without complication/bleeding. -Regards the left heel pain I recommended a steroid injection but she declined. Discussed stretching, icing exercises daily as well as supportive shoe gear. It did not resolve the next  couple weeks call the office in follow-up otherwise I'll see her back as scheduled. -Discussed daily foot inspection. If there are any changes, to call the office immediately.  -Follow-up in 3 months or sooner if any problems are to arise. In the meantime, encouraged to call the office with any questions, concerns, changes symptoms.  Celesta Gentile, DPM

## 2017-05-04 ENCOUNTER — Ambulatory Visit
Admission: RE | Admit: 2017-05-04 | Discharge: 2017-05-04 | Disposition: A | Discharge status: Home / Self Care | Payer: Medicare Other | Source: Ambulatory Visit | Attending: Family Medicine | Admitting: Family Medicine

## 2017-05-04 DIAGNOSIS — Z1231 Encounter for screening mammogram for malignant neoplasm of breast: Secondary | ICD-10-CM

## 2017-05-05 ENCOUNTER — Other Ambulatory Visit: Payer: Self-pay | Admitting: Family Medicine

## 2017-05-05 DIAGNOSIS — R928 Other abnormal and inconclusive findings on diagnostic imaging of breast: Secondary | ICD-10-CM

## 2017-05-12 ENCOUNTER — Other Ambulatory Visit: Payer: Self-pay | Admitting: Family Medicine

## 2017-05-12 ENCOUNTER — Ambulatory Visit
Admission: RE | Admit: 2017-05-12 | Discharge: 2017-05-12 | Disposition: A | Discharge status: Home / Self Care | Payer: Medicare Other | Source: Ambulatory Visit | Attending: Family Medicine | Admitting: Family Medicine

## 2017-05-12 DIAGNOSIS — R928 Other abnormal and inconclusive findings on diagnostic imaging of breast: Secondary | ICD-10-CM | POA: Diagnosis not present

## 2017-05-12 DIAGNOSIS — N6489 Other specified disorders of breast: Secondary | ICD-10-CM | POA: Diagnosis not present

## 2017-05-12 DIAGNOSIS — N6320 Unspecified lump in the left breast, unspecified quadrant: Secondary | ICD-10-CM

## 2017-05-19 ENCOUNTER — Encounter: Payer: Self-pay | Admitting: Family Medicine

## 2017-05-19 ENCOUNTER — Ambulatory Visit (INDEPENDENT_AMBULATORY_CARE_PROVIDER_SITE_OTHER): Payer: Medicare Other | Admitting: Family Medicine

## 2017-05-19 VITALS — BP 134/70 | HR 68 | Temp 98.5°F | Resp 18 | Ht 66.0 in | Wt 369.0 lb

## 2017-05-19 DIAGNOSIS — D869 Sarcoidosis, unspecified: Secondary | ICD-10-CM

## 2017-05-19 DIAGNOSIS — Z23 Encounter for immunization: Secondary | ICD-10-CM | POA: Diagnosis not present

## 2017-05-19 DIAGNOSIS — M7502 Adhesive capsulitis of left shoulder: Secondary | ICD-10-CM | POA: Diagnosis not present

## 2017-05-19 DIAGNOSIS — Z85118 Personal history of other malignant neoplasm of bronchus and lung: Secondary | ICD-10-CM | POA: Diagnosis not present

## 2017-05-19 DIAGNOSIS — M25512 Pain in left shoulder: Secondary | ICD-10-CM | POA: Diagnosis not present

## 2017-05-19 DIAGNOSIS — R062 Wheezing: Secondary | ICD-10-CM | POA: Diagnosis not present

## 2017-05-19 DIAGNOSIS — I10 Essential (primary) hypertension: Secondary | ICD-10-CM | POA: Diagnosis not present

## 2017-05-19 LAB — POCT URINALYSIS DIP (DEVICE)
BILIRUBIN URINE: NEGATIVE
GLUCOSE, UA: NEGATIVE mg/dL
Hgb urine dipstick: NEGATIVE
Ketones, ur: NEGATIVE mg/dL
LEUKOCYTES UA: NEGATIVE
NITRITE: NEGATIVE
Protein, ur: NEGATIVE mg/dL
SPECIFIC GRAVITY, URINE: 1.015 (ref 1.005–1.030)
UROBILINOGEN UA: 0.2 mg/dL (ref 0.0–1.0)
pH: 7 (ref 5.0–8.0)

## 2017-05-19 LAB — BASIC METABOLIC PANEL WITH GFR
BUN/Creatinine Ratio: 18 (calc) (ref 6–22)
BUN: 28 mg/dL — AB (ref 7–25)
CALCIUM: 8.9 mg/dL (ref 8.6–10.4)
CHLORIDE: 102 mmol/L (ref 98–110)
CO2: 29 mmol/L (ref 20–32)
Creat: 1.53 mg/dL — ABNORMAL HIGH (ref 0.50–0.99)
GFR, Est African American: 41 mL/min/{1.73_m2} — ABNORMAL LOW (ref >=60)
GFR, Est Non African American: 36 mL/min/{1.73_m2} — ABNORMAL LOW (ref >=60)
Glucose, Bld: 105 mg/dL — ABNORMAL HIGH (ref 65–99)
POTASSIUM: 4.8 mmol/L (ref 3.5–5.3)
Sodium: 141 mmol/L (ref 135–146)

## 2017-05-19 LAB — EXTRA LAV TOP TUBE

## 2017-05-19 MED ORDER — ALBUTEROL SULFATE (2.5 MG/3ML) 0.083% IN NEBU
2.5000 mg | INHALATION_SOLUTION | Freq: Once | RESPIRATORY_TRACT | Status: AC
  Administered 2017-05-19: 2.5 mg via RESPIRATORY_TRACT

## 2017-05-19 NOTE — Progress Notes (Signed)
Subjective:    Patient ID: Pamela [name], female    DOB: 1951-04-20, 66 y.o.   MRN: [id number]  HPI  Pamela [name] is a 66 year old female with history of chronic diastolic CHF, gouty arthritis, HTN, ,and sarcodosis that presents for a 3 month follow up of chronic conditions.  She states that she feels well and is without complaint. She states that she has not been following a lowfat, low sodium diet and has not increased her daily activity. She has been taking all medications consistently. She also has a history of chronic kidney disese and is followed by [facility].   She has recently followed up with Dr. Melvyn Novas, pulmonologist for sarcoidosis and history of asthma.   She is also evaluated by Dr. Irene Limbo, oncology every three months. Patient denies headache, chest pain, abdominal pain, dysuria, rectal bleeding, nausea, vomiting, diarrhea, or constipation. No new weakness tingling or numbness. She also denies cough, or shortness of breath.   [name] has severe obesity. She has been following a low sodium, carbohydrate modified, low calorie diet consistently over the past several months. She is unable to exercise due to history of sarcoidosis and asthma.    Past Medical History:  Diagnosis Date  . Arthritis   . Asthma   . Cancer ([place])    lung, adenocarcinoma right lung 2012  . CHF (congestive heart failure) (HCC)    Preserved EF  . Congenital single kidney    With chronic kidney disease  . COPD (chronic obstructive pulmonary disease) ([place])   . Gout   . Hypertension   . Lung cancer [facility]) 2012   Right upper lobe lung adenocarcinoma diagnosed with needle biopsy treated by SBRT finished treatment April 2013 has been monitored since  . Mass of chest wall, right    Right chest wall mass 7.3 cm biopsy on 12/13/2013. Patient notes it was consistent with sarcoidosis but actual pathology results not available.  . Oxygen deficiency   . Sarcoidosis   . Sickle cell trait ([place])    . Sleep apnea    Social History   Social History  . Marital status: Divorced    Spouse name: N/A  . Number of children: 2  . Years of education: N/A   Social History Main Topics  . Smoking status: Never Smoker  . Smokeless tobacco: Never Used  . Alcohol use No  . Drug use: No  . Sexual activity: Not Asked   Other Topics Concern  . None   Social History Narrative   Lives with son.  One son is deceased.     Allergies  Allergen Reactions  . Sulfur Rash   Immunization History  Administered Date(s) Administered  . Influenza,inj,Quad PF,6+ Mos 04/13/2014, 04/16/2015, 04/03/2016  . Pneumococcal Conjugate-13 04/03/2016  . Pneumococcal Polysaccharide-23 04/13/2014  . Tdap 01/02/2015  . Zoster 05/21/2014   Review of Systems  Constitutional: Negative.  Negative for fatigue and fever.  HENT: Negative.   Eyes: Negative.  Negative for visual disturbance.  Respiratory: Negative for chest tightness and shortness of breath.   Cardiovascular: Negative.   Gastrointestinal: Negative.   Endocrine: Negative.  Negative for polydipsia, polyphagia and polyuria.  Genitourinary: Negative.   Musculoskeletal: Negative for arthralgias and myalgias.  Skin: Negative.   Allergic/Immunologic: Negative.   Neurological: Negative.  Negative for light-headedness and headaches.  Hematological: Negative.   Psychiatric/Behavioral: Negative.  Negative for suicidal ideas.       Objective:   Physical Exam  Constitutional: She  is oriented to person, place, and time. Vital signs are normal. She appears well-developed and well-nourished.  Morbid obesity  HENT:  Head: Normocephalic and atraumatic.  Right Ear: Hearing, tympanic membrane, external ear and ear canal normal.  Left Ear: Hearing, tympanic membrane, external ear and ear canal normal.  Mouth/Throat: Uvula is midline and oropharynx is clear and moist. She has dentures.  Eyes: Pupils are equal, round, and reactive to light. Conjunctivae and  EOM are normal. Lids are everted and swept, no foreign bodies found.  Neck: Trachea normal and normal range of motion. Neck supple.  Cardiovascular: Normal rate, regular rhythm, S1 normal, S2 normal, normal heart sounds and intact distal pulses.   Bilateral lower extremity edema  Pulmonary/Chest: Effort normal and breath sounds normal.  Abdominal: Soft. Normal appearance and bowel sounds are normal.  Increased abdominal girth  Musculoskeletal:       Left shoulder: She exhibits decreased range of motion, tenderness, pain and decreased strength (2/5). She exhibits no swelling.  Neurological: She is alert and oriented to person, place, and time. She has normal reflexes.  Skin: Skin is warm, dry and intact.  Hyperpigmentation to lower extremities   Psychiatric: She has a normal mood and affect. Her speech is normal and behavior is normal. Judgment and thought content normal. Cognition and memory are normal.      BP 134/70 (BP Location: Right Arm, Patient Position: Sitting, Cuff Size: Large) Comment: manually  Pulse 68   Temp 98.5 F (36.9 C) (Oral)   Resp 18   Ht 5\' 6"  (1.676 m)   Wt (!) 369 lb (167.4 kg)   SpO2 98%   BMI 59.56 kg/m  Assessment & Plan:  1. Essential hypertension Blood pressure is at goal on current medication regimen We will continue furosemide 40 mg twice daily We will evaluate renal functioning as results become available - BASIC METABOLIC PANEL WITH GFR  2. Morbid (severe) obesity due to excess calories (HCC) Body mass index is 59.56 kg/m.  Patient has severe morbid obesity, she was previously referred to nutrition and diabetes.  She has not been following a low-fat low carbohydrate diet consistently.  She has a difficult time with cardiovascular activity due to morbid obesity.  Recommend water aerobics, she is a candidate for Silver sne[facility] at the [facility].  Weight loss goal is 10 pounds over 3 months.  3. Acute pain of left shoulder I suspect the patient has  frozen shoulder on the left side.  Patient was given shoulder exercises to complete at home.  Patient may benefit from physical therapy workup and evaluation.  I have sent order.  Recommend applying warm compresses to left shoulder daily as needed - PT eval and treat; Future  4. Adhesive capsulitis of left shoulder - PT eval and treat; Future  5. History of lung cancer Patient to follow-up with Dr. Irene Limbo and oncology as scheduled 6. Sarcoidosis  Follow-up with Dr. Melvyn Novas and pulmonology as previously scheduled.  Continue medications as prescribed  8. Wheezing - albuterol (PROVENTIL) (2.5 MG/3ML) 0.083% nebulizer solution 2.5 mg; Take 3 mLs (2.5 mg total) by nebulization once.   RTC: 3 months for chronic conditions   [place]  MSN, FNP-C [facility] [place] [address] [place], [place] [address] [phone]

## 2017-05-19 NOTE — Patient Instructions (Addendum)
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  Patient was given shoulder exercises to complete at home.  Patient may benefit from physical therapy workup and evaluation.  I have sent order.  Recommend applying warm compresses to left shoulder daily as needed    Shoulder Exercises Ask your health care provider which exercises are safe for you. Do exercises exactly as told by your health care provider and adjust them as directed. It is normal to feel mild stretching, pulling, tightness, or discomfort as you do these exercises, but you should stop right away if you feel sudden pain or your pain gets worse.Do not begin these exercises until told by your health care provider. RANGE OF MOTION EXERCISES These exercises warm up your muscles and joints and improve the movement and flexibility of your shoulder. These exercises also help to relieve pain, numbness, and tingling. These exercises involve stretching your injured shoulder directly. Exercise A: Pendulum  1. Stand near a wall or a surface that you can hold onto for balance. 2. Bend at the waist and let your left / right arm hang straight down. Use your other arm to support you. Keep your back straight and do not lock your knees. 3. Relax your left / right arm and shoulder muscles, and move your hips and your trunk so your left / right arm swings freely. Your arm should swing because of the motion of your body, not because you are using your arm or shoulder muscles. 4. Keep moving your body so your arm swings in the following directions, as told by your health care provider: ? Side to side. ? Forward and backward. ? In clockwise and counterclockwise circles. 5. Continue each motion for __________ seconds, or for as long as told by your health care provider. 6. Slowly return to the starting position. Repeat __________ times. Complete this exercise __________ times a day. Exercise B:Flexion, Standing  1. Stand and hold a  broomstick, a cane, or a similar object. Place your hands a little more than shoulder-width apart on the object. Your left / right hand should be palm-up, and your other hand should be palm-down. 2. Keep your elbow straight and keep your shoulder muscles relaxed. Push the stick down with your healthy arm to raise your left / right arm in front of your body, and then over your head until you feel a stretch in your shoulder. ? Avoid shrugging your shoulder while you raise your arm. Keep your shoulder blade tucked down toward the middle of your back. 3. Hold for __________ seconds. 4. Slowly return to the starting position. Repeat __________ times. Complete this exercise __________ times a day. Exercise C: Abduction, Standing 1. Stand and hold a broomstick, a cane, or a similar object. Place your hands a little more than shoulder-width apart on the object. Your left / right hand should be palm-up, and your other hand should be palm-down. 2. While keeping your elbow straight and your shoulder muscles relaxed, push the stick across your body toward your left / right side. Raise your left / right arm to the side of your body and then over your head until you feel a stretch in your shoulder. ? Do not raise your arm above shoulder height, unless your health care provider tells you to do that. ? Avoid shrugging your shoulder while you raise your arm. Keep your shoulder blade tucked down toward the middle of your back. 3. Hold for __________ seconds. 4. Slowly return to the  starting position. Repeat __________ times. Complete this exercise __________ times a day. Exercise D:Internal Rotation  1. Place your left / right hand behind your back, palm-up. 2. Use your other hand to dangle an exercise band, a towel, or a similar object over your shoulder. Grasp the band with your left / right hand so you are holding onto both ends. 3. Gently pull up on the band until you feel a stretch in the front of your left /  right shoulder. ? Avoid shrugging your shoulder while you raise your arm. Keep your shoulder blade tucked down toward the middle of your back. 4. Hold for __________ seconds. 5. Release the stretch by letting go of the band and lowering your hands. Repeat __________ times. Complete this exercise __________ times a day. STRETCHING EXERCISES These exercises warm up your muscles and joints and improve the movement and flexibility of your shoulder. These exercises also help to relieve pain, numbness, and tingling. These exercises are done using your healthy shoulder to help stretch the muscles of your injured shoulder. Exercise E: Warehouse manager (External Rotation and Abduction)  1. Stand in a doorway with one of your feet slightly in front of the other. This is called a staggered stance. If you cannot reach your forearms to the door frame, stand facing a corner of a room. 2. Choose one of the following positions as told by your health care provider: ? Place your hands and forearms on the door frame above your head. ? Place your hands and forearms on the door frame at the height of your head. ? Place your hands on the door frame at the height of your elbows. 3. Slowly move your weight onto your front foot until you feel a stretch across your chest and in the front of your shoulders. Keep your head and chest upright and keep your abdominal muscles tight. 4. Hold for __________ seconds. 5. To release the stretch, shift your weight to your back foot. Repeat __________ times. Complete this stretch __________ times a day. Exercise F:Extension, Standing 1. Stand and hold a broomstick, a cane, or a similar object behind your back. ? Your hands should be a little wider than shoulder-width apart. ? Your palms should face away from your back. 2. Keeping your elbows straight and keeping your shoulder muscles relaxed, move the stick away from your body until you feel a stretch in your shoulder. ? Avoid  shrugging your shoulders while you move the stick. Keep your shoulder blade tucked down toward the middle of your back. 3. Hold for __________ seconds. 4. Slowly return to the starting position. Repeat __________ times. Complete this exercise __________ times a day. STRENGTHENING EXERCISES These exercises build strength and endurance in your shoulder. Endurance is the ability to use your muscles for a long time, even after they get tired. Exercise G:External Rotation  1. Sit in a stable chair without armrests. 2. Secure an exercise band at elbow height on your left / right side. 3. Place a soft object, such as a folded towel or a small pillow, between your left / right upper arm and your body to move your elbow a few inches away (about 10 cm) from your side. 4. Hold the end of the band so it is tight and there is no slack. 5. Keeping your elbow pressed against the soft object, move your left / right forearm out, away from your abdomen. Keep your body steady so only your forearm moves. 6. Hold for __________ seconds.  7. Slowly return to the starting position. Repeat __________ times. Complete this exercise __________ times a day. Exercise H:Shoulder Abduction  1. Sit in a stable chair without armrests, or stand. 2. Hold a __________ weight in your left / right hand, or hold an exercise band with both hands. 3. Start with your arms straight down and your left / right palm facing in, toward your body. 4. Slowly lift your left / right hand out to your side. Do not lift your hand above shoulder height unless your health care provider tells you that this is safe. ? Keep your arms straight. ? Avoid shrugging your shoulder while you do this movement. Keep your shoulder blade tucked down toward the middle of your back. 5. Hold for __________ seconds. 6. Slowly lower your arm, and return to the starting position. Repeat __________ times. Complete this exercise __________ times a day. Exercise  I:Shoulder Extension 1. Sit in a stable chair without armrests, or stand. 2. Secure an exercise band to a stable object in front of you where it is at shoulder height. 3. Hold one end of the exercise band in each hand. Your palms should face each other. 4. Straighten your elbows and lift your hands up to shoulder height. 5. Step back, away from the secured end of the exercise band, until the band is tight and there is no slack. 6. Squeeze your shoulder blades together as you pull your hands down to the sides of your thighs. Stop when your hands are straight down by your sides. Do not let your hands go behind your body. 7. Hold for __________ seconds. 8. Slowly return to the starting position. Repeat __________ times. Complete this exercise __________ times a day. Exercise J:Standing Shoulder Row 1. Sit in a stable chair without armrests, or stand. 2. Secure an exercise band to a stable object in front of you so it is at waist height. 3. Hold one end of the exercise band in each hand. Your palms should be in a thumbs-up position. 4. Bend each of your elbows to an "L" shape (about 90 degrees) and keep your upper arms at your sides. 5. Step back until the band is tight and there is no slack. 6. Slowly pull your elbows back behind you. 7. Hold for __________ seconds. 8. Slowly return to the starting position. Repeat __________ times. Complete this exercise __________ times a day. Exercise K:Shoulder Press-Ups  1. Sit in a stable chair that has armrests. Sit upright, with your feet flat on the floor. 2. Put your hands on the armrests so your elbows are bent and your fingers are pointing forward. Your hands should be about even with the sides of your body. 3. Push down on the armrests and use your arms to lift yourself off of the chair. Straighten your elbows and lift yourself up as much as you comfortably can. ? Move your shoulder blades down, and avoid letting your shoulders move up toward  your ears. ? Keep your feet on the ground. As you get stronger, your feet should support less of your body weight as you lift yourself up. 4. Hold for __________ seconds. 5. Slowly lower yourself back into the chair. Repeat __________ times. Complete this exercise __________ times a day. Exercise L: Wall Push-Ups  1. Stand so you are facing a stable wall. Your feet should be about one arm-length away from the wall. 2. Lean forward and place your palms on the wall at shoulder height. 3. Keep your feet  flat on the floor as you bend your elbows and lean forward toward the wall. 4. Hold for __________ seconds. 5. Straighten your elbows to push yourself back to the starting position. Repeat __________ times. Complete this exercise __________ times a day. This information is not intended to replace advice given to you by your health care provider. Make sure you discuss any questions you have with your health care provider. Document Released: 05/20/2005 Document Revised: 03/30/2016 Document Reviewed: 03/17/2015 Elsevier Interactive Patient Education  2018 [employer]. Adhesive Capsulitis Adhesive capsulitis is inflammation of the tendons and ligaments that surround the shoulder joint (shoulder capsule). This condition causes the shoulder to become stiff and painful to move. Adhesive capsulitis is also called frozen shoulder. What are the causes? This condition may be caused by:  An injury to the shoulder joint.  Straining the shoulder.  Not moving the shoulder for a period of time. This can happen if your arm was injured or in a sling.  Long-standing health problems, such as: ? Diabetes. ? Thyroid problems. ? Heart disease. ? Stroke. ? Rheumatoid arthritis. ? Lung disease.  In some cases, the cause may not be known. What increases the risk? This condition is more likely to develop in:  Women.  People who are older than 64 years of age.  What are the signs or symptoms? Symptoms  of this condition include:  Pain in the shoulder when moving the arm. There may also be pain when parts of the shoulder are touched. The pain is worse at night or when at rest.  Soreness or aching in the shoulder.  Inability to move the shoulder normally.  Muscle spasms.  How is this diagnosed? This condition is diagnosed with a physical exam and imaging tests, such as an X-ray or MRI. How is this treated? This condition may be treated with:  Treatment of the underlying cause or condition.  Physical therapy. This involves performing exercises to get the shoulder moving again.  Medicine. Medicine may be given to relieve pain, inflammation, or muscle spasms.  Steroid injections into the shoulder joint.  Shoulder manipulation. This is a procedure to move the shoulder into another position. It is done after you are given a medicine to make you fall asleep (general anesthetic). The joint may also be injected with salt water at high pressure to break down scarring.  Surgery. This may be done in severe cases when other treatments have failed.  Although most people recover completely from adhesive capsulitis, some may not regain the full movement of the shoulder. Follow these instructions at home:  Take over-the-counter and prescription medicines only as told by your health care provider.  If you are being treated with physical therapy, follow instructions from your physical therapist.  Avoid exercises that put a lot of demand on your shoulder, such as throwing. These exercises can make pain worse.  If directed, apply ice to the injured area: ? Put ice in a plastic bag. ? Place a towel between your skin and the bag. ? Leave the ice on for 20 minutes, 2-3 times per day. Contact a health care provider if:  You develop new symptoms.  Your symptoms get worse. This information is not intended to replace advice given to you by your health care provider. Make sure you discuss any  questions you have with your health care provider. Document Released: 05/03/2009 Document Revised: 12/12/2015 Document Reviewed: 10/29/2014 Elsevier Interactive Patient Education  [employer].

## 2017-05-20 ENCOUNTER — Telehealth: Payer: Self-pay

## 2017-05-20 NOTE — Telephone Encounter (Signed)
Called no answer and no voicemail. Will try later. Thanks!

## 2017-05-20 NOTE — Telephone Encounter (Signed)
-----   Message from Dorena Dew, [place] sent at 05/20/2017 12:50 PM EDT ----- Regarding: Lab results Please inform patient that creatinine level remains mildly elevated which is consistent with baseline.  Will fax results to [facility].  All other labs are unremarkable.  Thanks ----- Message ----- From: Cheyenne Adas Lab Results In Sent: 05/19/2017   7:30 PM To: Dorena Dew, FNP

## 2017-05-21 ENCOUNTER — Other Ambulatory Visit: Payer: Self-pay | Admitting: Internal Medicine

## 2017-05-21 NOTE — Telephone Encounter (Signed)
Called and spoke with patient, advised that creatine still remains mildly elevated but is consistent with baseline and that we will fax kidney dr . Results. Advised that all other labs were unremarkable and to keep follow up appointment with [place]. Patient verbalized understanding. Thanks!

## 2017-05-31 ENCOUNTER — Other Ambulatory Visit (HOSPITAL_BASED_OUTPATIENT_CLINIC_OR_DEPARTMENT_OTHER): Payer: Medicare Other

## 2017-05-31 ENCOUNTER — Ambulatory Visit (HOSPITAL_COMMUNITY)
Admission: RE | Admit: 2017-05-31 | Discharge: 2017-05-31 | Disposition: A | Discharge status: Home / Self Care | Payer: Medicare Other | Source: Ambulatory Visit | Attending: Hematology | Admitting: Hematology

## 2017-05-31 ENCOUNTER — Other Ambulatory Visit: Payer: Medicare Other

## 2017-05-31 DIAGNOSIS — Z85118 Personal history of other malignant neoplasm of bronchus and lung: Secondary | ICD-10-CM

## 2017-05-31 DIAGNOSIS — R222 Localized swelling, mass and lump, trunk: Secondary | ICD-10-CM

## 2017-05-31 DIAGNOSIS — C3490 Malignant neoplasm of unspecified part of unspecified bronchus or lung: Secondary | ICD-10-CM | POA: Diagnosis not present

## 2017-05-31 DIAGNOSIS — I517 Cardiomegaly: Secondary | ICD-10-CM | POA: Insufficient documentation

## 2017-05-31 DIAGNOSIS — C3410 Malignant neoplasm of upper lobe, unspecified bronchus or lung: Secondary | ICD-10-CM | POA: Insufficient documentation

## 2017-05-31 DIAGNOSIS — I700 Atherosclerosis of aorta: Secondary | ICD-10-CM | POA: Insufficient documentation

## 2017-05-31 LAB — COMPREHENSIVE METABOLIC PANEL
ALBUMIN: 3.1 g/dL — AB (ref 3.5–5.0)
ALK PHOS: 143 U/L (ref 40–150)
ALT: 14 U/L (ref 0–55)
ANION GAP: 10 meq/L (ref 3–11)
AST: 19 U/L (ref 5–34)
BILIRUBIN TOTAL: 0.43 mg/dL (ref 0.20–1.20)
BUN: 26 mg/dL (ref 7.0–26.0)
CO2: 30 mEq/L — ABNORMAL HIGH (ref 22–29)
Calcium: 9 mg/dL (ref 8.4–10.4)
Chloride: 103 mEq/L (ref 98–109)
Creatinine: 1.6 mg/dL — ABNORMAL HIGH (ref 0.6–1.1)
EGFR: 38 mL/min/{1.73_m2} — AB (ref >60)
GLUCOSE: 94 mg/dL (ref 70–140)
POTASSIUM: 4.2 meq/L (ref 3.5–5.1)
SODIUM: 142 meq/L (ref 136–145)
TOTAL PROTEIN: 7.1 g/dL (ref 6.4–8.3)

## 2017-05-31 LAB — CBC & DIFF AND RETIC
BASO%: 0.6 % (ref 0.0–2.0)
Basophils Absolute: 0 10*3/uL (ref 0.0–0.1)
EOS%: 12.4 % — AB (ref 0.0–7.0)
Eosinophils Absolute: 0.7 10*3/uL — ABNORMAL HIGH (ref 0.0–0.5)
HCT: 37.5 % (ref 34.8–46.6)
HGB: 11.8 g/dL (ref 11.6–15.9)
IMMATURE RETIC FRACT: 7.8 % (ref 1.60–10.00)
LYMPH#: 1.2 10*3/uL (ref 0.9–3.3)
LYMPH%: 22.9 % (ref 14.0–49.7)
MCH: 22.4 pg — ABNORMAL LOW (ref 25.1–34.0)
MCHC: 31.5 g/dL (ref 31.5–36.0)
MCV: 71.2 fL — ABNORMAL LOW (ref 79.5–101.0)
MONO#: 0.4 10*3/uL (ref 0.1–0.9)
MONO%: 7.1 % (ref 0.0–14.0)
NEUT%: 57 % (ref 38.4–76.8)
NEUTROS ABS: 3 10*3/uL (ref 1.5–6.5)
PLATELETS: 163 10*3/uL (ref 145–400)
RBC: 5.27 10*6/uL (ref 3.70–5.45)
RDW: 15.1 % — ABNORMAL HIGH (ref 11.2–14.5)
RETIC %: 1.28 % (ref 0.70–2.10)
RETIC CT ABS: 67.46 10*3/uL (ref 33.70–90.70)
WBC: 5.3 10*3/uL (ref 3.9–10.3)

## 2017-06-01 NOTE — Progress Notes (Signed)
[name] Kitchen    HEMATOLOGY/ONCOLOGY CLINIC NOTE  Date of Service: 06/02/17   Patient Care Team: Dorena Dew, FNP as PCP - General (Family Medicine)  CHIEF COMPLAINTS/PURPOSE OF CONSULTATION:  Followup of lung cancer  DIAGNOSIS 1. Right upper lobe T1b, N0, M0 primary lung adenocarcinoma diagnosed in December 2012. She was deemed not to be a surgical candidate. Treated by radiation oncology with SBRT at [facility]. Completed treatment in March 2013 and has been monitored since with no evidence of recurrence based on PET/CT scan done on 05/20/2015.  2.  Right anterior chest wall slowly growing masslike lesion in the right pectoralis minor muscle. Unclear etiology. Biopsy was done on 12/14/2013 at the [place] [name]. FNA showed skeletal muscles, macrophages and spindle cells some with cytologic atypia. Needle core biopsy showed soft tissue and skeletal muscle with extensive necrosis; rare spindle cells with cytologic atypia. Treatment effect causing reactive atypia cannot be ruled out. Clinical correlation was advised to ensure the sample is representative. PET/CT scan done here on 05/20/2015 shows no change in metabolic activity or size of the medial right chest wall mass suggesting benign etiology.  INTERVAL HISTORY  Pamela [name] is here for her scheduled 6 month follow-up. She notes that she is doing well overall and has no new acute concerns. She reports that she was taken off at home oxygen in July 2018 and her breathing has been doing well. She will have a repeat US and mammography on 08/10/2017 to rule out right chest wall mass. She will be going to [name] next month for Christmas to visit her family for a couple weeks. This will be her first Christmas back home since moving to [place]. She has joined a church recently and has increased her social circle.   Since her last visit to the office, she has had bilateral screening mammography completed at [facility] on  05/04/2017 with results showing: Further evaluation is suggested for possible asymmetries in the left breast. Subsequently, on 05/12/2017 she had a left unilateral diagnostic mammography with ultrasonography, showing IMPRESSION: Two areas of probably benign fat necrosis within the inner left breast. Recommend follow-up left breast diagnostic mammogram in 3 months to ensure improvement/resolution. She also had a CT Chest completed on 05/31/2017 with results showing: IMPRESSION: 1. Mild degradation secondary to patient body habitus. 2. Similar right upper lobe presumed radiation induced fibrosis. No locally recurrent or metastatic disease. 3. Cardiomegaly.  Aortic Atherosclerosis (ICD10-I70.0). 4. Pulmonary artery enlargement suggests pulmonary arterial hypertension. 5. Similar nonspecific anterior right chest wall mass.   On review of systems, she denies abdominal pain, mouth sores, or sores. She denies any other symptoms.      MEDICAL HISTORY:  Past Medical History:  Diagnosis Date  . Arthritis   . Asthma   . Cancer ([place])    lung, adenocarcinoma right lung 2012  . CHF (congestive heart failure) (HCC)    Preserved EF  . Congenital single kidney    With chronic kidney disease  . COPD (chronic obstructive pulmonary disease) ([place])   . Gout   . Hypertension   . Lung cancer [facility]) 2012   Right upper lobe lung adenocarcinoma diagnosed with needle biopsy treated by SBRT finished treatment April 2013 has been monitored since  . Mass of chest wall, right    Right chest wall mass 7.3 cm biopsy on 12/13/2013. Patient notes it was consistent with sarcoidosis but actual pathology results not available.  . Oxygen deficiency   . Sarcoidosis   .  Sickle cell trait ([place])   . Sleep apnea    CKD ([place] 11/21/2015 showed no urinary tract abnormalities.) Gout on Ulor[place] - tea as a trigger.  SURGICAL HISTORY: Past Surgical History:  Procedure Laterality Date  . LUNG BIOPSY    . TUBAL LIGATION    . VEIN  LIGATION AND STRIPPING      SOCIAL HISTORY: Social History   Socioeconomic History  . Marital status: Divorced    Spouse name: Not on file  . Number of children: 2  . Years of education: Not on file  . Highest education level: Not on file  Social Needs  . Financial resource strain: Not on file  . Food insecurity - worry: Not on file  . Food insecurity - inability: Not on file  . Transportation needs - medical: Not on file  . Transportation needs - non-medical: Not on file  Occupational History  . Not on file  Tobacco Use  . Smoking status: Never Smoker  . Smokeless tobacco: Never Used  Substance and Sexual Activity  . Alcohol use: No    Alcohol/week: 0.0 oz  . Drug use: No  . Sexual activity: Not on file  Other Topics Concern  . Not on file  Social History Narrative   Lives with son.  One son is deceased.      FAMILY HISTORY: Family History  Problem Relation Age of Onset  . Hypertension Mother   . Renal Disease Mother   . Cancer Father        stomach  . Heart disease Father        No details  . Cancer Brother   . Diabetes Brother   . Cervical cancer Sister   . Diabetes Sister   . Multiple myeloma Sister     ALLERGIES:  is allergic to sulfur.  MEDICATIONS:  Current Outpatient Medications  Medication Sig Dispense Refill  . acetaminophen (TYLENOL) 500 MG tablet Take 1 tablet (500 mg total) by mouth every 6 (six) hours as needed. (Patient taking differently: Take 500 mg by mouth every 6 (six) hours as needed for moderate pain. ) 30 tablet 2  . albuterol (PROAIR HFA) 108 (90 Base) MCG/ACT inhaler Inhale 2 puffs into the lungs every 6 (six) hours as needed for wheezing or shortness of breath. 1 Inhaler 3  . albuterol (PROVENTIL) (2.5 MG/3ML) 0.083% nebulizer solution Take 3 mLs (2.5 mg total) by nebulization every 6 (six) hours as needed for wheezing or shortness of breath. 75 mL 12  . aspirin 81 MG tablet Take 1 tablet (81 mg total) by mouth daily. 90 tablet 5  .  Calcium Carbonate-Vitamin D (CALTRATE 600+D) 600-400 MG-UNIT tablet Take 1 tablet by mouth daily. (Patient taking differently: Take 1 tablet by mouth every evening. ) 90 tablet 5  . docusate sodium (COLACE) 100 MG capsule Take 1 capsule (100 mg total) by mouth daily. 30 capsule 0  . febuxostat (ULORIC) 40 MG tablet Take 1 tablet (40 mg total) by mouth daily. 90 tablet 5  . furosemide (LASIX) 40 MG tablet Take 1 tablet (40 mg total) by mouth 2 (two) times daily. 180 tablet 5  . potassium chloride (K-DUR) 10 MEQ tablet TAKE 1 TABLET BY MOUTH ONCE DAILY 90 tablet 1  . predniSONE (DELTASONE) 10 MG tablet Take 1 tablet (10 mg total) by mouth daily with breakfast. (Patient taking differently: Take 5 mg 3 (three) times a week by mouth. ) 30 tablet 5  . SYMBICORT 160-4.5 MCG/ACT inhaler INHALE  2 PUFFS BY MOUTH TWICE DAILY 1 Inhaler 5   No current facility-administered medications for this visit.     REVIEW OF SYSTEMS:    10 Point review of Systems was done is negative except as noted above.  PHYSICAL EXAMINATION:  ECOG PERFORMANCE STATUS: 2-3  . Vitals:   06/02/17 1000  BP: (!) 141/74  Pulse: 78  Resp: 19  Temp: (!) 97.5 F (36.4 C)  SpO2: 98%   Filed Weights   06/02/17 1000  Weight: (!) 396 lb (179.6 kg)   .Body mass index is 63.92 kg/m.   . Wt Readings from Last 3 Encounters:  06/02/17 (!) 396 lb (179.6 kg)  05/19/17 (!) 369 lb (167.4 kg)  02/25/17 (!) 363 lb 12.8 oz (165 kg)   GENERAL:alert,  morbidly obese female in no acute distress and comfortable SKIN: skin color, texture, turgor are normal, no rashes or significant lesions EYES: normal, conjunctiva are pink and non-injected, sclera clear OROPHARYNX:no exudate, no erythema and lips, buccal mucosa, and tongue normal  NECK: supple, no JVD, thyroid normal size, non-tender, without nodularity LYMPH:  no palpable lymphadenopathy in the cervical, axillary or inguinal LUNG: Distant breath sounds, no rales, no rhonchi. HEART:  regular rate & rhythm,  no murmurs and no lower extremity edema ABDOMEN: abdomen soft, non-tender, normoactive bowel sounds  Musculoskeletal: no cyanosis of digits and no clubbing  PSYCH: alert & oriented x 3 with fluent speech NEURO: no focal motor/sensory deficits  LABORATORY DATA:  I have reviewed the data as listed  . CBC Latest Ref Rng & Units 05/31/2017 11/26/2016 08/04/2016  WBC 3.9 - 10.3 10e3/uL 5.3 5.5 4.6  Hemoglobin 11.6 - 15.9 g/dL 11.8 11.6 10.6(L)  Hematocrit 34.8 - 46.6 % 37.5 36.9 35.1  Platelets 145 - 400 10e3/uL 163 149 167   . CBC    Component Value Date/Time   WBC 5.3 05/31/2017 1050   WBC 4.6 08/04/2016 1102   RBC 5.27 05/31/2017 1050   RBC 4.86 08/04/2016 1102   HGB 11.8 05/31/2017 1050   HCT 37.5 05/31/2017 1050   PLT 163 05/31/2017 1050   MCV 71.2 (L) 05/31/2017 1050   MCH 22.4 (L) 05/31/2017 1050   MCH 21.8 (L) 08/04/2016 1102   MCHC 31.5 05/31/2017 1050   MCHC 30.2 (L) 08/04/2016 1102   RDW 15.1 (H) 05/31/2017 1050   LYMPHSABS 1.2 05/31/2017 1050   MONOABS 0.4 05/31/2017 1050   EOSABS 0.7 (H) 05/31/2017 1050   BASOSABS 0.0 05/31/2017 1050     . CMP Latest Ref Rng & Units 05/31/2017 05/19/2017 02/16/2017  Glucose 70 - 140 mg/dl 94 105(H) 93  BUN 7.0 - 26.0 mg/dL 26.0 28(H) 25  Creatinine 0.6 - 1.1 mg/dL 1.6(H) 1.53(H) 1.40(H)  Sodium 136 - 145 mEq/L 142 141 142  Potassium 3.5 - 5.1 mEq/L 4.2 4.8 4.0  Chloride 98 - 110 mmol/L - 102 102  CO2 22 - 29 mEq/L 30(H) 29 27  Calcium 8.4 - 10.4 mg/dL 9.0 8.9 8.8  Total Protein 6.4 - 8.3 g/dL 7.1 - -  Total Bilirubin 0.20 - 1.20 mg/dL 0.43 - -  Alkaline Phos 40 - 150 U/L 143 - -  AST 5 - 34 U/L 19 - -  ALT 0 - 55 U/L 14 - -    RADIOGRAPHIC STUDIES: I have personally reviewed the radiological images as listed and agreed with the findings in the report. Ct Chest Wo Contrast  Result Date: 05/31/2017 CLINICAL DATA:  Right-sided lung cancer diagnosed in 2012.  Radiation therapy completed in 2018.  EXAM: CT CHEST WITHOUT CONTRAST TECHNIQUE: Multidetector CT imaging of the chest was performed following the standard protocol without IV contrast. COMPARISON:  05/26/2016 FINDINGS: Cardiovascular: Expected limitations secondary to patient body habitus. Aortic and branch vessel atherosclerosis. Tortuous thoracic aorta. Moderate cardiomegaly, without pericardial effusion. Pulmonary artery enlargement, outflow tract 3.2 cm. Mediastinum/Nodes: No mediastinal or definite hilar adenopathy, given limitations of unenhanced CT. Tiny hiatal hernia. Lungs/Pleura: No pleural fluid. Posterior right upper lobe pulmonary nodule is similar at 4 mm on image 52/series 5. A 3 mm left lower lobe pulmonary nodule is unchanged on image 95/ series 5. Similar appearance of inferior right upper lobe presumed radiation induced volume loss and fibrosis. No locally recurrent disease. Upper Abdomen: Caudate lobe enlargement. Normal imaged portions of the spleen, stomach, pancreas, gallbladder, biliary tract, adrenal glands, kidneys. Musculoskeletal: Subpectoral right chest wall mass measures on the order of 5.7 x 3.6 cm on image 65/ series 2. This is similar to 6.0 x 3.5 cm on the prior exam (when remeasured). No underlying osseous destruction. Mild thoracic spondylosis. IMPRESSION: 1. Mild degradation secondary to patient body habitus. 2. Similar right upper lobe presumed radiation induced fibrosis. No locally recurrent or metastatic disease. 3. Cardiomegaly.  Aortic Atherosclerosis (ICD10-I70.0). 4. Pulmonary artery enlargement suggests pulmonary arterial hypertension. 5. Similar nonspecific anterior right chest wall mass. Electronically Signed   By: Abigail Miyamoto M.D.   On: 05/31/2017 15:24   Mm Digital Screening Bilateral  Result Date: 05/04/2017 CLINICAL DATA:  Screening. EXAM: DIGITAL SCREENING BILATERAL MAMMOGRAM WITH CAD COMPARISON:  Previous exam(s). ACR Breast Density Category a: The breast tissue is almost entirely fatty.  FINDINGS: In the left breast, possible asymmetries warrant further evaluation. The 2 possible asymmetries are seen within the lower inner quadrant of the left breast. In the right breast, no findings suspicious for malignancy. Images were processed with CAD. IMPRESSION: Further evaluation is suggested for possible asymmetries in the left breast. RECOMMENDATION: Diagnostic mammogram and possibly ultrasound of the left breast. (Code:[id number]) The patient will be contacted regarding the findings, and additional imaging will be scheduled. BI-RADS CATEGORY  0: Incomplete. Need additional imaging evaluation and/or prior mammograms for comparison. Electronically Signed   By: Franki Cabot M.D.   On: 05/04/2017 10:48   US Breast Ltd Uni Left Inc Axilla  Result Date: 05/12/2017 CLINICAL DATA:  Patient returns today to evaluate possible left breast asymmetries questioned on recent screening mammogram. EXAM: 2D DIGITAL DIAGNOSTIC LEFT MAMMOGRAM WITH CAD AND ADJUNCT TOMO ULTRASOUND LEFT BREAST COMPARISON:  Previous exams including recent screening mammogram dated 05/04/2017. ACR Breast Density Category a: The breast tissue is almost entirely fatty. FINDINGS: On today's additional views with 3D tomosynthesis, 2 separate asymmetries are confirmed within the inner left breast. Both asymmetries have intermixed lucencies suggesting internal fat, raising the possibility of fat necrosis. Mammographic images were processed with CAD. On physical exam, there is skin bruising along the inner left breast. Targeted ultrasound is performed, showing a mixed echogenicity area in the left breast at the 10 o'clock axis, 10 cm from the nipple, with central cystic appearing focus which measures 1.3 x 0.4 x 1.1 cm, avascular, corresponding to the mammographic finding, most suggestive of benign fat necrosis. Similar mixed echogenicity area is seen in the left breast at the 10 o'clock axis, 5 cm from the nipple, with central cystic appearing  focus which measures 1.3 x 0.4 x 1 cm, avascular, corresponding to the additional mammographic finding, also most suggestive of benign fat necrosis.  IMPRESSION: Two areas of probably benign fat necrosis within the inner left breast. Recommend follow-up left breast diagnostic mammogram in 3 months to ensure improvement/resolution. RECOMMENDATION: Left breast diagnostic mammogram, and possible ultrasound, in 3 months. I have discussed the findings and recommendations with the patient. Results were also provided in writing at the conclusion of the visit. If applicable, a reminder letter will be sent to the patient regarding the next appointment. BI-RADS CATEGORY  3: Probably benign. Electronically Signed   By: Franki Cabot M.D.   On: 05/12/2017 11:29   Mm Diag Breast Tomo Uni Left  Result Date: 05/12/2017 CLINICAL DATA:  Patient returns today to evaluate possible left breast asymmetries questioned on recent screening mammogram. EXAM: 2D DIGITAL DIAGNOSTIC LEFT MAMMOGRAM WITH CAD AND ADJUNCT TOMO ULTRASOUND LEFT BREAST COMPARISON:  Previous exams including recent screening mammogram dated 05/04/2017. ACR Breast Density Category a: The breast tissue is almost entirely fatty. FINDINGS: On today's additional views with 3D tomosynthesis, 2 separate asymmetries are confirmed within the inner left breast. Both asymmetries have intermixed lucencies suggesting internal fat, raising the possibility of fat necrosis. Mammographic images were processed with CAD. On physical exam, there is skin bruising along the inner left breast. Targeted ultrasound is performed, showing a mixed echogenicity area in the left breast at the 10 o'clock axis, 10 cm from the nipple, with central cystic appearing focus which measures 1.3 x 0.4 x 1.1 cm, avascular, corresponding to the mammographic finding, most suggestive of benign fat necrosis. Similar mixed echogenicity area is seen in the left breast at the 10 o'clock axis, 5 cm from the  nipple, with central cystic appearing focus which measures 1.3 x 0.4 x 1 cm, avascular, corresponding to the additional mammographic finding, also most suggestive of benign fat necrosis. IMPRESSION: Two areas of probably benign fat necrosis within the inner left breast. Recommend follow-up left breast diagnostic mammogram in 3 months to ensure improvement/resolution. RECOMMENDATION: Left breast diagnostic mammogram, and possible ultrasound, in 3 months. I have discussed the findings and recommendations with the patient. Results were also provided in writing at the conclusion of the visit. If applicable, a reminder letter will be sent to the patient regarding the next appointment. BI-RADS CATEGORY  3: Probably benign. Electronically Signed   By: Franki Cabot M.D.   On: 05/12/2017 11:29   ASSESSMENT & PLAN:   66 year old African-American female with   #1 history of right upper lobe T1b, N0, M0 primary lung adenocarcinoma most in December 2012. She was deemed not to be a surgical candidate. Treated by radiation oncology with SBRT at [facility]. Completed treatment in March 2013 and has been monitored since with no evidence of recurrence. no evidence of recurrence based on PET/CT scan done on 05/20/2015. On clinical visit today the patient has no new change in her breathing and no new focal symptoms.  Hemoglobin is stable.   CT chest 05/26/2016 shows no evidence of local recurrence of cancer or metastatic disease. CT Chest, 05/31/2017 shows no evidence of local recurrence of cancer or metastatic disease.  Plan  -No overt clinical symptoms suggestive of recurrent lung cancer at this time. -We will plan to follow-up the patient in 6 months for an H&P and repeat labs unless any new symptoms arise.  #2 Right anterior chest wall slowly growing masslike lesion in the right pectoralis minor muscle. Unclear etiology. FNA showed skeletal muscles, macrophages and spindle cells some with cytologic  atypia. Needle core biopsy showed soft tissue and skeletal muscle with  extensive necrosis; rare spindle cells with cytologic atypia. Treatment effect causing reactive atypia cannot be ruled out.  PET/CT scan done here on 05/20/2015 shows no change in metabolic activity or size of the medial right chest wall mass suggesting benign etiology. Could certainly be related to her sarcoidosis . Previously discussed with the patient the option of biopsying the mass for a more definitive diagnosis versus monitoring it. She chooses to monitor it at this time given that imaging findings suggest a likely benign etiology that hasnt changed over the last 18 months. CT chest 05/26/2016 shows that this lesion is stable compared to previous imaging. -CT Chest, 05/31/2017 shows that this lesion is stable. No locally recurrent or metastatic disease.   #3 history of extensive sarcoidosis involving lymphadenopathy in the chest, abdomen as well as spleen and possibly liver .previously treated with steroids by Dr. Bartholome Bill, pulmonary at [facility] .has recently established care with Dr. Melvyn Novas for her pulmonary and sarcoidosis management.   Plan -Continue followup with Dr. Melvyn Novas For further management   #5 history of sickle cell trait. Hemoglobin electrophoresis is not available. Hgb Improved from 9.9 to 11.3. Microcytosis likely from chronic disease from her sarcoidosis vs possible alpha thal trait. Ferritin levels WNL  Plan   -No indication for additional iron replacement at this time.   RTC with Dr Irene Limbo in 6 months with labs   All of the patients questions were answered  to her apparent satisfaction. The patient knows to call the clinic with any problems, questions or concerns.  I spent 20 minutes counseling the patient face to face. The total time spent in the appointment was 25 minutes and more than 50% was on counseling and direct patient cares.    Sullivan Lone MD [place] AAHIVMS [facility] Hematology/Oncology  Physician [place]  (Office):       [phone] (Work cell):  [phone] (Fax):           [phone]  This document serves as a record of services personally performed by Sullivan Lone, MD. It was created on his behalf by Steva Colder, a trained medical scribe. The creation of this record is based on the scribe's personal observations and the provider's statements to them.   .I have reviewed the above documentation for accuracy and completeness, and I agree with the above. Brunetta Genera MD

## 2017-06-02 ENCOUNTER — Telehealth: Payer: Self-pay | Admitting: Hematology

## 2017-06-02 ENCOUNTER — Encounter: Payer: Self-pay | Admitting: Hematology

## 2017-06-02 ENCOUNTER — Ambulatory Visit (HOSPITAL_BASED_OUTPATIENT_CLINIC_OR_DEPARTMENT_OTHER): Payer: Medicare Other | Admitting: Hematology

## 2017-06-02 VITALS — BP 141/74 | HR 78 | Temp 97.5°F | Resp 19 | Ht 66.0 in | Wt 396.0 lb

## 2017-06-02 DIAGNOSIS — I10 Essential (primary) hypertension: Secondary | ICD-10-CM

## 2017-06-02 DIAGNOSIS — R222 Localized swelling, mass and lump, trunk: Secondary | ICD-10-CM

## 2017-06-02 DIAGNOSIS — N189 Chronic kidney disease, unspecified: Secondary | ICD-10-CM | POA: Diagnosis not present

## 2017-06-02 DIAGNOSIS — C3410 Malignant neoplasm of upper lobe, unspecified bronchus or lung: Secondary | ICD-10-CM

## 2017-06-02 NOTE — Patient Instructions (Signed)
Thank you for choosing [facility] to provide your oncology and hematology care.  To afford each patient quality time with our providers, please arrive 30 minutes before your scheduled appointment time.  If you arrive late for your appointment, you may be asked to reschedule.  We strive to give you quality time with our providers, and arriving late affects you and other patients whose appointments are after yours.   If you are a no show for multiple scheduled visits, you may be dismissed from the clinic at the providers discretion.    Again, thank you for choosing [facility], our hope is that these requests will decrease the amount of time that you wait before being seen by our physicians.  ______________________________________________________________________  Should you have questions after your visit to the [facility], please contact our office at [phone] between the hours of 8:30 and 4:30 p.m.    Voicemails left after 4:30p.m will not be returned until the following business day.    For prescription refill requests, please have your pharmacy contact us directly.  Please also try to allow 48 hours for prescription requests.    Please contact the scheduling department for questions regarding scheduling.  For scheduling of procedures such as PET scans, CT scans, MRI, Ultrasound, etc please contact central scheduling at [phone].    Resources For Cancer Patients and Caregivers:   Oncolink.org:  A wonderful resource for patients and healthcare providers for information regarding your disease, ways to tract your treatment, what to expect, etc.     American Cancer Society:  800-227-2345  Can help patients locate various types of support and financial assistance  Cancer Care: 1-800-813-HOPE (4673) Provides financial assistance, online support groups, medication/co-pay assistance.    [place] DSS:  336-641-3447 Where to apply for food  stamps, Medicaid, and utility assistance  Medicare Rights Center: 800-333-4114 Helps people with Medicare understand their rights and benefits, navigate the Medicare system, and secure the quality healthcare they deserve  SCAT: 336-333-6589 [place] [employer]'s shared-ride transportation service for eligible riders who have a disability that prevents them from riding the fixed route bus.    For additional information on assistance programs please contact our social worker:   Grier Hock/Abigail Elmore:  [phone]            

## 2017-06-02 NOTE — Telephone Encounter (Signed)
Gave avs and calendar for May 2019 °

## 2017-07-01 DIAGNOSIS — M109 Gout, unspecified: Secondary | ICD-10-CM | POA: Diagnosis not present

## 2017-07-01 DIAGNOSIS — J449 Chronic obstructive pulmonary disease, unspecified: Secondary | ICD-10-CM | POA: Diagnosis not present

## 2017-07-01 DIAGNOSIS — D631 Anemia in chronic kidney disease: Secondary | ICD-10-CM | POA: Diagnosis not present

## 2017-07-01 DIAGNOSIS — D869 Sarcoidosis, unspecified: Secondary | ICD-10-CM | POA: Diagnosis not present

## 2017-07-01 DIAGNOSIS — I509 Heart failure, unspecified: Secondary | ICD-10-CM | POA: Diagnosis not present

## 2017-07-01 DIAGNOSIS — I129 Hypertensive chronic kidney disease with stage 1 through stage 4 chronic kidney disease, or unspecified chronic kidney disease: Secondary | ICD-10-CM | POA: Diagnosis not present

## 2017-07-01 DIAGNOSIS — N2581 Secondary hyperparathyroidism of renal origin: Secondary | ICD-10-CM | POA: Diagnosis not present

## 2017-07-01 DIAGNOSIS — D573 Sickle-cell trait: Secondary | ICD-10-CM | POA: Diagnosis not present

## 2017-07-01 DIAGNOSIS — E8881 Metabolic syndrome: Secondary | ICD-10-CM | POA: Diagnosis not present

## 2017-07-01 DIAGNOSIS — N189 Chronic kidney disease, unspecified: Secondary | ICD-10-CM | POA: Diagnosis not present

## 2017-07-05 ENCOUNTER — Ambulatory Visit: Payer: Medicare Other | Admitting: Podiatry

## 2017-07-12 ENCOUNTER — Ambulatory Visit: Payer: Medicare Other | Admitting: Podiatry

## 2017-07-26 ENCOUNTER — Ambulatory Visit (INDEPENDENT_AMBULATORY_CARE_PROVIDER_SITE_OTHER): Payer: Medicare Other | Admitting: Podiatry

## 2017-07-26 ENCOUNTER — Encounter: Payer: Self-pay | Admitting: Podiatry

## 2017-07-26 DIAGNOSIS — M79675 Pain in left toe(s): Secondary | ICD-10-CM

## 2017-07-26 DIAGNOSIS — B351 Tinea unguium: Secondary | ICD-10-CM | POA: Diagnosis not present

## 2017-07-26 DIAGNOSIS — M79674 Pain in right toe(s): Secondary | ICD-10-CM | POA: Diagnosis not present

## 2017-07-27 ENCOUNTER — Ambulatory Visit (INDEPENDENT_AMBULATORY_CARE_PROVIDER_SITE_OTHER): Payer: Medicare Other | Admitting: Family Medicine

## 2017-07-27 ENCOUNTER — Encounter: Payer: Self-pay | Admitting: Family Medicine

## 2017-07-27 VITALS — BP 136/68 | HR 74 | Temp 98.0°F | Resp 14 | Ht 66.0 in | Wt 368.0 lb

## 2017-07-27 DIAGNOSIS — R05 Cough: Secondary | ICD-10-CM | POA: Diagnosis not present

## 2017-07-27 DIAGNOSIS — R059 Cough, unspecified: Secondary | ICD-10-CM

## 2017-07-27 MED ORDER — GUAIFENESIN-CODEINE 100-10 MG/5ML PO SOLN: 5.0000 mL | Freq: Four times a day (QID) | ORAL | 0 refills | Status: DC | PRN

## 2017-07-27 NOTE — Progress Notes (Signed)
Subjective: 67 y.o. returns the office today for painful, elongated, thickened toenails which she cannot trim herself. Denies any redness or drainage around the nails. Denies any acute changes since last appointment and no new complaints today.  She is at her heel is doing much better.  She denies any numbness or tingling she has no other concerns. Denies any systemic complaints such as fevers, chills, nausea, vomiting.   Objective: AAO 3, NAD DP 2/4, PT 1/4; chronic ankle swelling present bilaterally Nails hypertrophic, dystrophic, elongated, brittle, discolored 10. There is tenderness overlying the nails 1-5 bilaterally. There is no surrounding erythema or drainage along the nail sites. No open lesions or pre-ulcerative lesions are identified. No other areas of tenderness identified bilaterally.  Heel pain is resolved. No pain with calf compression, warmth, erythema.  Chronic swelling of the bilateral lower extremities.  Assessment: Patient presents with symptomatic onychomycosis  Plan: -Treatment options including alternatives, risks, complications were discussed -Nails sharply debrided 10 without complication/bleeding. -Discussed daily foot inspection. If there are any changes, to call the office immediately.  -Follow-up in 3 months or sooner if any problems are to arise. In the meantime, encouraged to call the office with any questions, concerns, changes symptoms.  Celesta Gentile, DPM

## 2017-07-27 NOTE — Patient Instructions (Addendum)
Increase prednisone to 10 mg once daily with breakfast as prescribed x 7 days.    Please consult with Dr. Melvyn Novas regarding the reduced dose of prednisone that you are taking which is different that how the medication is ordered.    For cough, I am prescribing guaifenesin 5 ml, 3 times daily as needed for cough.

## 2017-07-27 NOTE — Progress Notes (Signed)
Patient ID: Pamela [name], female    DOB: July 25, 1950, 68 y.o.   MRN: [id number]  PCP: Dorena Dew, FNP  Chief Complaint  Patient presents with  . Cough    since 06/28/2017  . Nasal Congestion    Subjective:  HPI Pamela [name] is a 67 y.o. female presents for evaluation of a chronic cough. Onset cough early December. Cough became productive of yellow sputum within the last 2 weeks. Cough occurs intermittently. Denies associated fever, chills,  shortness of breath, wheezing, or chest tightness. She has only attempted relief with nebulizer treatment and short acting inhaler. She had nasal congestion, which resolved without intervention. Immunizations for both influenza and pneumonia are both up to date.  She states"I just feel as if I have infection in my body".  Social History   Socioeconomic History  . Marital status: Divorced    Spouse name: Not on file  . Number of children: 2  . Years of education: Not on file  . Highest education level: Not on file  Social Needs  . Financial resource strain: Not on file  . Food insecurity - worry: Not on file  . Food insecurity - inability: Not on file  . Transportation needs - medical: Not on file  . Transportation needs - non-medical: Not on file  Occupational History  . Not on file  Tobacco Use  . Smoking status: Never Smoker  . Smokeless tobacco: Never Used  Substance and Sexual Activity  . Alcohol use: No    Alcohol/week: 0.0 oz  . Drug use: No  . Sexual activity: Not on file  Other Topics Concern  . Not on file  Social History Narrative   Lives with son.  One son is deceased.      Family History  Problem Relation Age of Onset  . Hypertension Mother   . Renal Disease Mother   . Cancer Father        stomach  . Heart disease Father        No details  . Cancer Brother   . Diabetes Brother   . Cervical cancer Sister   . Diabetes Sister   . Multiple myeloma Sister     Review of Systems  Constitutional:  Negative.   HENT: Negative for congestion, ear pain, rhinorrhea, sinus pressure, sinus pain, sneezing and sore throat.   Respiratory: Positive for cough.   Cardiovascular: Negative.   Neurological: Negative for dizziness and headaches.  Hematological: Negative.   Psychiatric/Behavioral: Negative.     Patient Active Problem List   Diagnosis Date Noted  . Plantar fasciitis 04/14/2017  . Microcytosis 11/28/2015  . 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 Onychomycosis 01/09/2015  . Ingrown nail 01/09/2015  . Pain in lower limb 01/09/2015  . Chronic respiratory failure with hypoxia and hypercapnia ([place]) 07/19/2014  . CHF (congestive heart failure) ([place]) 06/11/2014  . Anemia, iron deficiency 05/27/2014  . Acute gouty arthritis 05/25/2014  . Acute renal failure superimposed on stage 3 chronic kidney disease ([place]) 05/25/2014  . Obstructive sleep apnea 05/25/2014  . Joint pain 05/24/2014  . Chronic diastolic CHF (congestive heart failure) ([place])   . Sarcoidosis   . Metabolic syndrome 93/90/3009  . Asthma, chronic 04/27/2014  . Anemia of chronic disease 04/19/2014  . Morbid (severe) obesity due to excess calories ([place]) 04/18/2014  . Immunization due 04/18/2014  . Need for prophylactic vaccination and inoculation against influenza 04/18/2014  . Prediabetes 04/13/2014  . Essential hypertension 04/13/2014  . Lower extremity edema 04/13/2014  .  History of lung cancer 04/13/2014    Allergies  Allergen Reactions  . Sulfur Rash    Prior to Admission medications   Medication Sig Start Date End Date Taking? Authorizing Provider  acetaminophen (TYLENOL) 500 MG tablet Take 1 tablet (500 mg total) by mouth every 6 (six) hours as needed. 04/16/15  Yes Dorena Dew, FNP  albuterol (PROAIR HFA) 108 (90 Base) MCG/ACT inhaler Inhale 2 puffs into the lungs every 6 (six) hours as needed for wheezing or shortness of breath. 04/28/16  Yes Parrett, [name] S, NP  albuterol (PROVENTIL) (2.5 MG/3ML) 0.083%  nebulizer solution Take 3 mLs (2.5 mg total) by nebulization every 6 (six) hours as needed for wheezing or shortness of breath. 06/06/15  Yes Tanda Rockers, MD  aspirin 81 MG tablet Take 1 tablet (81 mg total) by mouth daily. 01/02/16  Yes Dorena Dew, FNP  Calcium Carbonate-Vitamin D (CALTRATE 600+D) 600-400 MG-UNIT tablet Take 1 tablet by mouth daily. 01/02/16  Yes Dorena Dew, FNP  docusate sodium (COLACE) 100 MG capsule Take 1 capsule (100 mg total) by mouth daily. 01/02/16  Yes Dorena Dew, FNP  febuxostat (ULORIC) 40 MG tablet Take 1 tablet (40 mg total) by mouth daily. 10/01/16  Yes Dorena Dew, FNP  furosemide (LASIX) 40 MG tablet Take 1 tablet (40 mg total) by mouth 2 (two) times daily. 10/30/16  Yes Dorena Dew, FNP  potassium chloride (K-DUR) 10 MEQ tablet TAKE 1 TABLET BY MOUTH ONCE DAILY 03/23/17  Yes Dorena Dew, FNP  predniSONE (DELTASONE) 10 MG tablet Take 1 tablet (10 mg total) by mouth daily with breakfast. 09/15/16  Yes Tanda Rockers, MD  SYMBICORT 160-4.5 MCG/ACT inhaler INHALE 2 PUFFS BY MOUTH TWICE DAILY 05/21/17  Yes Tanda Rockers, MD    Past Medical, Surgical Family and Social History reviewed and updated.    Objective:   Today's Vitals   07/27/17 1054  BP: 136/68  Pulse: 74  Resp: 14  Temp: 98 F (36.7 C)  TempSrc: Oral  SpO2: 95%  Weight: (!) 368 lb (166.9 kg)  Height: _0  (1.676 m)    Wt Readings from Last 3 Encounters:  07/27/17 (!) 368 lb (166.9 kg)  06/02/17 (!) 396 lb (179.6 kg)  05/19/17 (!) 369 lb (167.4 kg)    Physical Exam  Constitutional: She is oriented to person, place, and time. She appears well-developed and well-nourished. She does not appear ill.  HENT:  Head: Normocephalic and atraumatic.  Neck: Normal range of motion. Neck supple. No thyromegaly present.  Cardiovascular: Normal rate, normal heart sounds and intact distal pulses.  Pulmonary/Chest: Effort normal and breath sounds normal.  diminished  lungs sounds throughout lung fields.   Musculoskeletal: Normal range of motion.  Neurological: She is alert and oriented to person, place, and time.  Skin: Skin is warm and dry.  Psychiatric: She has a normal mood and affect. Her behavior is normal. Judgment and thought content normal.   Assessment & Plan:  1. Cough, patient is prescribed chronic prednisone by pulmonologist. She independently reduced her dose and skips days, which may likely be the underlying cause for development of cough. Not convinced patient has any underlying URI or Bronchial illness. During the entire visit, patient never coughed and was able to continue a conversation in her normal tone of voice. Recommended resume previously prescribed prednisone dose 10 mg once daily for at least 7 days. Within this time frame, patient should follow-up with Dr. Melvyn Novas  to discuss chronic prednisone regimen. Will add as needed guaifenesin with codeine., 5 ml up to 3 times daily prn. Keep scheduled follow-up with PCP. If no improvement of cough, consider chest x-ray.  -Antibiotic therapy is not warranted based upon exam today.   Carroll Sage. Kenton Kingfisher, MSN, FNP-C [facility]  [address] [place], [place] [address] [phone]

## 2017-08-10 ENCOUNTER — Ambulatory Visit: Payer: Medicare Other

## 2017-08-10 ENCOUNTER — Ambulatory Visit
Admission: RE | Admit: 2017-08-10 | Discharge: 2017-08-10 | Disposition: A | Discharge status: Home / Self Care | Payer: Medicare Other | Source: Ambulatory Visit | Attending: Family Medicine | Admitting: Family Medicine

## 2017-08-10 DIAGNOSIS — N6320 Unspecified lump in the left breast, unspecified quadrant: Secondary | ICD-10-CM

## 2017-08-10 DIAGNOSIS — R928 Other abnormal and inconclusive findings on diagnostic imaging of breast: Secondary | ICD-10-CM | POA: Diagnosis not present

## 2017-08-19 ENCOUNTER — Ambulatory Visit (INDEPENDENT_AMBULATORY_CARE_PROVIDER_SITE_OTHER): Payer: Medicare Other | Admitting: Family Medicine

## 2017-08-19 ENCOUNTER — Encounter: Payer: Self-pay | Admitting: Family Medicine

## 2017-08-19 VITALS — BP 127/57 | HR 70 | Temp 98.0°F | Resp 16 | Ht 66.0 in | Wt 362.0 lb

## 2017-08-19 DIAGNOSIS — R7303 Prediabetes: Secondary | ICD-10-CM | POA: Diagnosis not present

## 2017-08-19 DIAGNOSIS — M7502 Adhesive capsulitis of left shoulder: Secondary | ICD-10-CM | POA: Diagnosis not present

## 2017-08-19 DIAGNOSIS — D869 Sarcoidosis, unspecified: Secondary | ICD-10-CM | POA: Diagnosis not present

## 2017-08-19 DIAGNOSIS — Z85118 Personal history of other malignant neoplasm of bronchus and lung: Secondary | ICD-10-CM | POA: Diagnosis not present

## 2017-08-19 DIAGNOSIS — I10 Essential (primary) hypertension: Secondary | ICD-10-CM

## 2017-08-19 LAB — POCT URINALYSIS DIP (DEVICE)
BILIRUBIN URINE: NEGATIVE
GLUCOSE, UA: NEGATIVE mg/dL
Hgb urine dipstick: NEGATIVE
KETONES UR: NEGATIVE mg/dL
LEUKOCYTES UA: NEGATIVE
Nitrite: NEGATIVE
PROTEIN: NEGATIVE mg/dL
SPECIFIC GRAVITY, URINE: 1.01 (ref 1.005–1.030)
Urobilinogen, UA: 0.2 mg/dL (ref 0.0–1.0)
pH: 5 (ref 5.0–8.0)

## 2017-08-19 LAB — POCT GLYCOSYLATED HEMOGLOBIN (HGB A1C): HEMOGLOBIN A1C: 5.6

## 2017-08-19 NOTE — Progress Notes (Signed)
Subjective:    Patient ID: Pamela [name], female    DOB: [birth year], 67 y.o.   MRN: [id number]  Hypertension  Pertinent negatives include no headaches or shortness of breath.    Pamela [name] is a 67 year old female with history of chronic diastolic CHF, gouty arthritis, HTN, ,and sarcodosis that presents for a 3 month follow up of chronic conditions.  She states that she feels well and is without complaint. Pamela [name] has been following a low fat diet and has increased daily activity. She has had a 10 pound weight loss over the past 3 months.  She has been taking all medications consistently. She also has a history of chronic kidney disese and is followed by [facility].   She has recently followed up with Dr. Melvyn Novas, pulmonologist for sarcoidosis and history of asthma.   She is also evaluated by Dr. Irene Limbo, oncology every three months. Patient denies headache, chest pain, abdominal pain, dysuria, rectal bleeding, nausea, vomiting, diarrhea, or constipation. No new weakness tingling or numbness. She also denies cough, or shortness of breath.     Past Medical History:  Diagnosis Date  . Arthritis   . Asthma   . Cancer ([place])    lung, adenocarcinoma right lung 2012  . CHF (congestive heart failure) (HCC)    Preserved EF  . Congenital single kidney    With chronic kidney disease  . COPD (chronic obstructive pulmonary disease) ([place])   . Gout   . Hypertension   . Lung cancer [facility]) 2012   Right upper lobe lung adenocarcinoma diagnosed with needle biopsy treated by SBRT finished treatment April 2013 has been monitored since  . Mass of chest wall, right    Right chest wall mass 7.3 cm biopsy on 12/13/2013. Patient notes it was consistent with sarcoidosis but actual pathology results not available.  . Oxygen deficiency   . Sarcoidosis   . Sickle cell trait ([place])   . Sleep apnea    Social History   Socioeconomic History  . Marital status: Divorced    Spouse name: None  .  Number of children: 2  . Years of education: None  . Highest education level: None  Social Needs  . Financial resource strain: None  . Food insecurity - worry: None  . Food insecurity - inability: None  . Transportation needs - medical: None  . Transportation needs - non-medical: None  Occupational History  . None  Tobacco Use  . Smoking status: Never Smoker  . Smokeless tobacco: Never Used  Substance and Sexual Activity  . Alcohol use: No    Alcohol/week: 0.0 oz  . Drug use: No  . Sexual activity: None  Other Topics Concern  . None  Social History Narrative   Lives with son.  One son is deceased.     Allergies  Allergen Reactions  . Sulfur Rash   Immunization History  Administered Date(s) Administered  . Influenza,inj,Quad PF,6+ Mos 04/13/2014, 04/16/2015, 04/03/2016, 05/19/2017  . Pneumococcal Conjugate-13 04/03/2016  . Pneumococcal Polysaccharide-23 04/13/2014  . Tdap 01/02/2015  . Zoster 05/21/2014   Review of Systems  Constitutional: Negative.  Negative for fatigue and fever.  HENT: Negative.   Eyes: Negative.  Negative for visual disturbance.  Respiratory: Negative for chest tightness and shortness of breath.   Cardiovascular: Negative.   Gastrointestinal: Negative.   Endocrine: Negative.  Negative for polydipsia, polyphagia and polyuria.  Genitourinary: Negative.   Musculoskeletal: Negative for arthralgias and myalgias.  Periodic left shoulder pain  Skin: Negative.   Allergic/Immunologic: Negative.   Neurological: Negative.  Negative for light-headedness and headaches.  Hematological: Negative.   Psychiatric/Behavioral: Negative.  Negative for suicidal ideas.       Objective:   Physical Exam  Constitutional: She is oriented to person, place, and time. Vital signs are normal. She appears well-developed and well-nourished.  Morbid obesity  HENT:  Head: Normocephalic and atraumatic.  Right Ear: Hearing, tympanic membrane, external ear and ear  canal normal.  Left Ear: Hearing, tympanic membrane, external ear and ear canal normal.  Mouth/Throat: Uvula is midline and oropharynx is clear and moist. She has dentures.  Eyes: Conjunctivae and EOM are normal. Pupils are equal, round, and reactive to light. Lids are everted and swept, no foreign bodies found.  Neck: Trachea normal and normal range of motion. Neck supple.  Cardiovascular: Normal rate, regular rhythm, S1 normal, S2 normal, normal heart sounds and intact distal pulses.  Bilateral lower extremity edema  Pulmonary/Chest: Effort normal and breath sounds normal.  Abdominal: Soft. Normal appearance and bowel sounds are normal.  Increased abdominal girth  Musculoskeletal:       Left shoulder: She exhibits decreased range of motion, tenderness, pain and decreased strength (2/5). She exhibits no swelling.  Neurological: She is alert and oriented to person, place, and time. She has normal reflexes.  Skin: Skin is warm, dry and intact.  Hyperpigmentation to lower extremities   Psychiatric: She has a normal mood and affect. Her speech is normal and behavior is normal. Judgment and thought content normal. Cognition and memory are normal.      BP (!) 127/57 (BP Location: Left Arm, Patient Position: Sitting, Cuff Size: Large)   Pulse 70   Temp 98 F (36.7 C) (Oral)   Resp 16   Ht 5\' 6"  (1.676 m)   Wt (!) 362 lb (164.2 kg)   SpO2 100%   BMI 58.43 kg/m  Assessment & Plan:  1. Essential hypertension Blood pressure is at goal on current medication regimen We will continue furosemide 40 mg twice daily We will evaluate renal functioning as results become available - POCT urinalysis dip (device)  2. Morbid (severe) obesity due to excess calories (HCC) Body mass index is 58.43 kg/m.  Patient has severe morbid obesity, she was previously referred to nutrition and diabetes.  She has not been following a low-fat low carbohydrate diet consistently.  She has a difficult time with  cardiovascular activity due to morbid obesity.  Recommend water aerobics, she is a candidate for Silver sneakers at the [facility].  Weight loss goal is 10 pounds over 3 months. 3. Prediabetes  - HgB A1c  4. Adhesive capsulitis of left shoulder Patient did not schedule evaluation with physical therapy. She says that she is not interested in going to another specialist at this time. Pain is controlled on Tylenol 500 mg every 6 hours for mild to moderate left shoulder pain.    5. History of lung cancer Patient to follow-up with Dr. Irene Limbo and oncology as scheduled  6. Sarcoidosis  Follow-up with Dr. Melvyn Novas and pulmonology as previously scheduled.  Continue medications as prescribed  RTC: 3 months for chronic conditions    [name]  MSN, FNP-C [facility] [address] [place], [place] [address] [phone]

## 2017-08-19 NOTE — Patient Instructions (Addendum)
You presented for follow-up of chronic conditions. Willl defer to nephrology for monitoring of creatinine level.  We will also call [facility] for current potassium level.  Your blood pressure is at goal today, no medication changes warranted on today. You have decreased weight by 10 pounds since previous appointment.  Please continue a low-fat, low carbohydrate diet divided over 5-6 small meals throughout the day.  Also increase water intake to 3-4 bottles per day.  Increase daily activity.

## 2017-08-30 DIAGNOSIS — N189 Chronic kidney disease, unspecified: Secondary | ICD-10-CM | POA: Diagnosis not present

## 2017-09-11 ENCOUNTER — Other Ambulatory Visit: Payer: Self-pay | Admitting: Family Medicine

## 2017-09-11 DIAGNOSIS — E876 Hypokalemia: Secondary | ICD-10-CM

## 2017-09-24 ENCOUNTER — Ambulatory Visit (INDEPENDENT_AMBULATORY_CARE_PROVIDER_SITE_OTHER): Payer: Medicare Other | Admitting: Family Medicine

## 2017-09-24 ENCOUNTER — Encounter: Payer: Self-pay | Admitting: Family Medicine

## 2017-09-24 VITALS — BP 127/59 | HR 68 | Temp 97.8°F | Resp 16 | Ht 66.0 in | Wt 369.0 lb

## 2017-09-24 DIAGNOSIS — M7989 Other specified soft tissue disorders: Secondary | ICD-10-CM | POA: Diagnosis not present

## 2017-09-24 DIAGNOSIS — R05 Cough: Secondary | ICD-10-CM

## 2017-09-24 DIAGNOSIS — I509 Heart failure, unspecified: Secondary | ICD-10-CM

## 2017-09-24 DIAGNOSIS — R0989 Other specified symptoms and signs involving the circulatory and respiratory systems: Secondary | ICD-10-CM | POA: Diagnosis not present

## 2017-09-24 DIAGNOSIS — I10 Essential (primary) hypertension: Secondary | ICD-10-CM | POA: Diagnosis not present

## 2017-09-24 DIAGNOSIS — R059 Cough, unspecified: Secondary | ICD-10-CM

## 2017-09-24 DIAGNOSIS — R252 Cramp and spasm: Secondary | ICD-10-CM | POA: Diagnosis not present

## 2017-09-24 LAB — POCT URINALYSIS DIP (DEVICE)
BILIRUBIN URINE: NEGATIVE
Glucose, UA: NEGATIVE mg/dL
HGB URINE DIPSTICK: NEGATIVE
Ketones, ur: NEGATIVE mg/dL
LEUKOCYTES UA: NEGATIVE
Nitrite: NEGATIVE
PH: 5.5 (ref 5.0–8.0)
Protein, ur: NEGATIVE mg/dL
SPECIFIC GRAVITY, URINE: 1.01 (ref 1.005–1.030)
Urobilinogen, UA: 0.2 mg/dL (ref 0.0–1.0)

## 2017-09-24 MED ORDER — AZITHROMYCIN 250 MG PO TABS: ORAL_TABLET | ORAL | 0 refills | Status: DC

## 2017-09-24 MED ORDER — CHLORPHEN-PE-ACETAMINOPHEN 4-10-325 MG PO TABS: 1.0000 | ORAL_TABLET | Freq: Four times a day (QID) | ORAL | 0 refills | Status: DC | PRN

## 2017-09-24 NOTE — Patient Instructions (Addendum)
Will start Azithromycin 500 mg today, and 250 mg on days 2-5.   Will also start Norel AD for persistent coughing, headache, and nasal congestion.   Increase rest, handwashing, and fluid intake.    Will follow up by phone with any abnormal results   For right leg swelling, elevate to heart level while at rest. Also, monitor salt intake. Limit sodium to 2400 mg daily

## 2017-09-24 NOTE — Progress Notes (Signed)
Subjective:    Patient ID: Pamela [name], female    DOB: April 24, 1951, 67 y.o.   MRN: [id number]  Pamela [name] is a 67 year old female with history of chronic diastolic CHF, morbid obesity,  gouty arthritis, HTN, ,and sarcodosis that presents complaining of upper respiratory symptoms and leg pain. She says that upper respiratory symptoms have been present for greater than 2 weeks. Symptoms include congestion, productive cough and ear pressure.  She is also complaining of leg pain for over 1 month.      Leg Pain   There was no injury mechanism. The pain is present in the left leg, left ankle, right leg and right ankle. Quality: Lower extremities feel "tight" The pain is at a severity of 3/10. The pain has been intermittent since onset. Pertinent negatives include no inability to bear weight or loss of motion. She has tried immobilization and elevation for the symptoms.  URI   This is a recurrent problem. The current episode started 1 to 4 weeks ago (2 weeks ago). The problem has been gradually worsening. There has been no fever. Associated symptoms include congestion, coughing, a plugged ear sensation and rhinorrhea. Pertinent negatives include no chest pain or headaches. She has tried decongestant, antihistamine and increased fluids for the symptoms.    Past Medical History:  Diagnosis Date  . Arthritis   . Asthma   . Cancer ([place])    lung, adenocarcinoma right lung 2012  . CHF (congestive heart failure) (HCC)    Preserved EF  . Congenital single kidney    With chronic kidney disease  . COPD (chronic obstructive pulmonary disease) ([place])   . Gout   . Hypertension   . Lung cancer [facility]) 2012   Right upper lobe lung adenocarcinoma diagnosed with needle biopsy treated by SBRT finished treatment April 2013 has been monitored since  . Mass of chest wall, right    Right chest wall mass 7.3 cm biopsy on 12/13/2013. Patient notes it was consistent with sarcoidosis but actual pathology  results not available.  . Oxygen deficiency   . Sarcoidosis   . Sickle cell trait ([place])   . Sleep apnea    Social History   Socioeconomic History  . Marital status: Divorced    Spouse name: None  . Number of children: 2  . Years of education: None  . Highest education level: None  Social Needs  . Financial resource strain: None  . Food insecurity - worry: None  . Food insecurity - inability: None  . Transportation needs - medical: None  . Transportation needs - non-medical: None  Occupational History  . None  Tobacco Use  . Smoking status: Never Smoker  . Smokeless tobacco: Never Used  Substance and Sexual Activity  . Alcohol use: No    Alcohol/week: 0.0 oz  . Drug use: No  . Sexual activity: None  Other Topics Concern  . None  Social History Narrative   Lives with son.  One son is deceased.     Allergies  Allergen Reactions  . Sulfur Rash   Immunization History  Administered Date(s) Administered  . Influenza,inj,Quad PF,6+ Mos 04/13/2014, 04/16/2015, 04/03/2016, 05/19/2017  . Pneumococcal Conjugate-13 04/03/2016  . Pneumococcal Polysaccharide-23 04/13/2014  . Tdap 01/02/2015  . Zoster 05/21/2014   Review of Systems  Constitutional: Negative.  Negative for fatigue and fever.  HENT: Positive for congestion and rhinorrhea.   Eyes: Negative.  Negative for visual disturbance.  Respiratory: Positive for cough. Negative for chest  tightness and shortness of breath.   Cardiovascular: Negative.  Negative for chest pain.  Gastrointestinal: Negative.   Endocrine: Negative.  Negative for polydipsia, polyphagia and polyuria.  Genitourinary: Negative.   Musculoskeletal: Negative for arthralgias and myalgias.       Periodic left shoulder pain  Skin: Negative.   Allergic/Immunologic: Negative.   Neurological: Negative.  Negative for light-headedness and headaches.  Hematological: Negative.   Psychiatric/Behavioral: Negative.  Negative for suicidal ideas.        Objective:   Physical Exam  Constitutional: She is oriented to person, place, and time. Vital signs are normal. She appears well-developed and well-nourished.  Morbid obesity  HENT:  Head: Normocephalic and atraumatic.  Right Ear: Hearing, tympanic membrane, external ear and ear canal normal.  Left Ear: Hearing, tympanic membrane, external ear and ear canal normal.  Mouth/Throat: Uvula is midline and oropharynx is clear and moist. She has dentures.  Eyes: Conjunctivae and EOM are normal. Pupils are equal, round, and reactive to light. Lids are everted and swept, no foreign bodies found.  Neck: Trachea normal and normal range of motion. Neck supple.  Cardiovascular: Normal rate, regular rhythm, S1 normal, S2 normal, normal heart sounds and intact distal pulses.  Bilateral lower extremity edema  Pulmonary/Chest: Effort normal and breath sounds normal.  Abdominal: Soft. Normal appearance and bowel sounds are normal.  Increased abdominal girth  Musculoskeletal:       Left shoulder: She exhibits decreased range of motion, tenderness, pain and decreased strength (2/5). She exhibits no swelling.  Neurological: She is alert and oriented to person, place, and time. She has normal reflexes.  Skin: Skin is warm, dry and intact.  Hyperpigmentation to lower extremities   Psychiatric: She has a normal mood and affect. Her speech is normal and behavior is normal. Judgment and thought content normal. Cognition and memory are normal.      BP (!) 127/59 (BP Location: Right Arm, Patient Position: Sitting, Cuff Size: Normal)   Pulse 68   Temp 97.8 F (36.6 C) (Oral)   Resp 16   Ht 5\' 6"  (1.676 m)   Wt (!) 369 lb (167.4 kg)   SpO2 98%   BMI 59.56 kg/m  Assessment & Plan:  1. Symptoms of upper respiratory infection (URI) - azithromycin (ZITHROMAX) 250 MG tablet; Take 500 mg, take 250 mg on days 2-5  Dispense: 6 tablet; Refill: 0 - Chlorphen-PE-Acetaminophen (NOREL AD) 4-10-325 MG TABS; Take 1  tablet by mouth every 6 (six) hours as needed.  Dispense: 20 tablet; Refill: 0  2. Cough - azithromycin (ZITHROMAX) 250 MG tablet; Take 500 mg, take 250 mg on days 2-5  Dispense: 6 tablet; Refill: 0 - Chlorphen-PE-Acetaminophen (NOREL AD) 4-10-325 MG TABS; Take 1 tablet by mouth every 6 (six) hours as needed.  Dispense: 20 tablet; Refill: 0  3. Leg cramping Will review electrolyte levels as results become available.  Limit sodium intake to 2400 mg per day or less.  Elevated lower extremities to heart level while at rest.   - Basic Metabolic Panel  4. Right leg swelling - Brain natriuretic peptide  5. Essential hypertension Blood pressure is at goal on current medication regimen.  - Continue medication, monitor blood pressure at home. Continue DASH diet. Reminder to go to the ER if any CP, SOB, nausea, dizziness, severe HA, changes vision/speech, left arm numbness and tingling and jaw pain.    - POCT urinalysis dip (device)  6. Congestive heart failure, unspecified HF chronicity, unspecified heart failure type (  [place])  - Brain natriuretic peptide  7. Morbid (severe) obesity due to excess calories (HCC) Body mass index is 59.56 kg/m. Patient has severe morbid obesity, she was previously referred to nutrition and diabetes.  She has not been following a low-fat low carbohydrate diet consistently.  She has a difficult time with cardiovascular activity due to morbid obesity.      RTC: Will follow up by phone with laboratory results  [name]  MSN, FNP-C [facility] [address] [place], [place] [address] [phone]

## 2017-09-25 LAB — BASIC METABOLIC PANEL
BUN / CREAT RATIO: 20 (ref 12–28)
BUN: 29 mg/dL — ABNORMAL HIGH (ref 8–27)
CHLORIDE: 102 mmol/L (ref 96–106)
CO2: 27 mmol/L (ref 20–29)
Calcium: 9.1 mg/dL (ref 8.7–10.3)
Creatinine, Ser: 1.44 mg/dL — ABNORMAL HIGH (ref 0.57–1.00)
GFR calc non Af Amer: 38 mL/min/{1.73_m2} — ABNORMAL LOW (ref >59)
GFR, EST AFRICAN AMERICAN: 44 mL/min/{1.73_m2} — AB (ref >59)
GLUCOSE: 85 mg/dL (ref 65–99)
POTASSIUM: 4.3 mmol/L (ref 3.5–5.2)
SODIUM: 146 mmol/L — AB (ref 134–144)

## 2017-09-25 LAB — BRAIN NATRIURETIC PEPTIDE: BNP: 41.2 pg/mL (ref 0.0–100.0)

## 2017-10-25 ENCOUNTER — Ambulatory Visit: Payer: Medicare Other | Admitting: Podiatry

## 2017-11-18 ENCOUNTER — Telehealth: Payer: Self-pay | Admitting: Hematology

## 2017-11-18 ENCOUNTER — Encounter: Payer: Self-pay | Admitting: Family Medicine

## 2017-11-18 ENCOUNTER — Ambulatory Visit (INDEPENDENT_AMBULATORY_CARE_PROVIDER_SITE_OTHER): Payer: Medicare Other | Admitting: Family Medicine

## 2017-11-18 VITALS — BP 125/61 | HR 63 | Temp 98.2°F | Resp 16 | Ht 66.0 in | Wt 373.0 lb

## 2017-11-18 DIAGNOSIS — I10 Essential (primary) hypertension: Secondary | ICD-10-CM | POA: Diagnosis not present

## 2017-11-18 LAB — POCT URINALYSIS DIPSTICK
Bilirubin, UA: NEGATIVE
Glucose, UA: NEGATIVE
Ketones, UA: NEGATIVE
LEUKOCYTES UA: NEGATIVE
NITRITE UA: NEGATIVE
PH UA: 5.5 (ref 5.0–8.0)
PROTEIN UA: NEGATIVE
RBC UA: NEGATIVE
SPEC GRAV UA: 1.01 (ref 1.010–1.025)
UROBILINOGEN UA: 0.2 U/dL

## 2017-11-18 NOTE — Telephone Encounter (Signed)
Called pt re appt being moved due to cme -attempted to leave vm for pt with appts. Sending confirmation letter in the mail.

## 2017-11-22 NOTE — Progress Notes (Signed)
Patient had to reschedule due to transportation constraints.   [name]  MSN, FNP-C [facility] [address] [place], [place] [address] [phone]

## 2017-11-26 ENCOUNTER — Ambulatory Visit (INDEPENDENT_AMBULATORY_CARE_PROVIDER_SITE_OTHER): Payer: Medicare Other | Admitting: Podiatry

## 2017-11-26 DIAGNOSIS — B351 Tinea unguium: Secondary | ICD-10-CM

## 2017-11-26 DIAGNOSIS — M79674 Pain in right toe(s): Secondary | ICD-10-CM | POA: Diagnosis not present

## 2017-11-26 DIAGNOSIS — M79675 Pain in left toe(s): Secondary | ICD-10-CM

## 2017-11-28 NOTE — Progress Notes (Signed)
Subjective: 67 y.o. returns the office today for painful, elongated, thickened toenails which she cannot trim herself. Denies any redness or drainage around the nails.  She has no new concerns today otherwise.  Denies any systemic complaints such as fevers, chills, nausea, vomiting.   Objective: AAO 3, NAD DP 2/4, PT 1/4; chronic ankle swelling present bilaterally no evidence of venous insufficiency but there is no ulcerations or drainage. Nails hypertrophic, dystrophic, elongated, brittle, discolored 10. There is tenderness overlying the nails 1-5 bilaterally. There is no surrounding erythema or drainage along the nail sites. No open lesions or pre-ulcerative lesions are identified. No other areas of tenderness identified bilaterally.  No pain to the heel today. No pain with calf compression, warmth, erythema.  Chronic swelling of the bilateral lower extremities.  Assessment: Patient presents with symptomatic onychomycosis  Plan: -Treatment options including alternatives, risks, complications were discussed -Nails sharply debrided 10 without complication/bleeding. -Discussed daily foot inspection. If there are any changes, to call the office immediately.  -Follow-up in 3 months or sooner if any problems are to arise. In the meantime, encouraged to call the office with any questions, concerns, changes symptoms.  Celesta Gentile, DPM

## 2017-12-03 ENCOUNTER — Ambulatory Visit: Payer: Medicare Other | Admitting: Hematology

## 2017-12-03 ENCOUNTER — Other Ambulatory Visit: Payer: Medicare Other

## 2017-12-08 NOTE — Progress Notes (Signed)
[name]    HEMATOLOGY/ONCOLOGY CLINIC NOTE  Date of Service: 12/09/17   Patient Care Team: Dorena Dew, FNP as PCP - General (Family Medicine)  CHIEF COMPLAINTS/PURPOSE OF CONSULTATION:  Followup of lung cancer  DIAGNOSIS 1. Right upper lobe T1b, N0, M0 primary lung adenocarcinoma diagnosed in December 2012. She was deemed not to be a surgical candidate. Treated by radiation oncology with SBRT at [facility]. Completed treatment in March 2013 and has been monitored since with no evidence of recurrence based on PET/CT scan done on 05/20/2015.  2.  Right anterior chest wall slowly growing masslike lesion in the right pectoralis minor muscle. Unclear etiology. Biopsy was done on 12/14/2013 at the [name]. FNA showed skeletal muscles, macrophages and spindle cells some with cytologic atypia. Needle core biopsy showed soft tissue and skeletal muscle with extensive necrosis; rare spindle cells with cytologic atypia. Treatment effect causing reactive atypia cannot be ruled out. Clinical correlation was advised to ensure the sample is representative. PET/CT scan done here on 05/20/2015 shows no change in metabolic activity or size of the medial right chest wall mass suggesting benign etiology.  INTERVAL HISTORY  Pamela [name] is here for her scheduled 6 month follow-up. The patient's last visit with [place] was on 06/02/17. The pt reports that she is doing well overall and has enjoyed her recent travels to see family in [place] and [place].   The pt has had a recent, mild cold that is resolving and not bothering her too much. The pt reports that she has no other new concerns and has had no troubles breathing.   She notes that she has had a sleep study and has not needed to use a CPAP.   Of note since the patient's last visit, pt has had MM completed on 08/10/17 with results revealing Resolving left breast fat necrosis. There are no findings of malignancy in the left  breast.  Lab results today (12/09/17) of CBC, CMP, and Reticulocytes is as follows: all values are WNL except for MCV at 71.9, MCH at 22.0, MCHC at 30.6, RDW at 15.2, Eosinophils Abs at 0.6k, CO2 at 31, BUN at 32, Creatinine at 1.61, Albumin at 3.4.  On review of systems, pt reports a recent cold, stable leg swelling, good energy levels and denies changes in breathing, abdominal pains, changes in bowel habits, and any other symptoms.   MEDICAL HISTORY:  Past Medical History:  Diagnosis Date  . Arthritis   . Asthma   . Cancer ([place])    lung, adenocarcinoma right lung 2012  . CHF (congestive heart failure) (HCC)    Preserved EF  . Congenital single kidney    With chronic kidney disease  . COPD (chronic obstructive pulmonary disease) ([place])   . Gout   . Hypertension   . Lung cancer [facility]) 2012   Right upper lobe lung adenocarcinoma diagnosed with needle biopsy treated by SBRT finished treatment April 2013 has been monitored since  . Mass of chest wall, right    Right chest wall mass 7.3 cm biopsy on 12/13/2013. Patient notes it was consistent with sarcoidosis but actual pathology results not available.  . Oxygen deficiency   . Sarcoidosis   . Sickle cell trait ([place])   . Sleep apnea    CKD ([place] 11/21/2015 showed no urinary tract abnormalities.) Gout on [place] - tea as a trigger.  SURGICAL HISTORY: Past Surgical History:  Procedure Laterality Date  . LUNG BIOPSY    . TUBAL LIGATION    .  VEIN LIGATION AND STRIPPING      SOCIAL HISTORY: Social History   Socioeconomic History  . Marital status: Divorced    Spouse name: Not on file  . Number of children: 2  . Years of education: Not on file  . Highest education level: Not on file  Occupational History  . Not on file  Social Needs  . Financial resource strain: Not on file  . Food insecurity:    Worry: Not on file    Inability: Not on file  . Transportation needs:    Medical: Not on file    Non-medical: Not on file  Tobacco  Use  . Smoking status: Never Smoker  . Smokeless tobacco: Never Used  Substance and Sexual Activity  . Alcohol use: No    Alcohol/week: 0.0 oz  . Drug use: No  . Sexual activity: Not on file  Lifestyle  . Physical activity:    Days per week: Not on file    Minutes per session: Not on file  . Stress: Not on file  Relationships  . Social connections:    Talks on phone: Not on file    Gets together: Not on file    Attends religious service: Not on file    Active member of club or organization: Not on file    Attends meetings of clubs or organizations: Not on file    Relationship status: Not on file  . Intimate partner violence:    Fear of current or ex partner: Not on file    Emotionally abused: Not on file    Physically abused: Not on file    Forced sexual activity: Not on file  Other Topics Concern  . Not on file  Social History Narrative   Lives with son.  One son is deceased.      FAMILY HISTORY: Family History  Problem Relation Age of Onset  . Hypertension Mother   . Renal Disease Mother   . Cancer Father        stomach  . Heart disease Father        No details  . Cancer Brother   . Diabetes Brother   . Cervical cancer Sister   . Diabetes Sister   . Multiple myeloma Sister     ALLERGIES:  is allergic to sulfur.  MEDICATIONS:  Current Outpatient Medications  Medication Sig Dispense Refill  . acetaminophen (TYLENOL) 500 MG tablet Take 1 tablet (500 mg total) by mouth every 6 (six) hours as needed. 30 tablet 2  . albuterol (PROAIR HFA) 108 (90 Base) MCG/ACT inhaler Inhale 2 puffs into the lungs every 6 (six) hours as needed for wheezing or shortness of breath. 1 Inhaler 3  . albuterol (PROVENTIL) (2.5 MG/3ML) 0.083% nebulizer solution Take 3 mLs (2.5 mg total) by nebulization every 6 (six) hours as needed for wheezing or shortness of breath. 75 mL 12  . aspirin 81 MG tablet Take 1 tablet (81 mg total) by mouth daily. 90 tablet 5  . Calcium Carbonate-Vitamin D  (CALTRATE 600+D) 600-400 MG-UNIT tablet Take 1 tablet by mouth daily. 90 tablet 5  . docusate sodium (COLACE) 100 MG capsule Take 1 capsule (100 mg total) by mouth daily. 30 capsule 0  . febuxostat (ULORIC) 40 MG tablet Take 1 tablet (40 mg total) by mouth daily. 90 tablet 5  . furosemide (LASIX) 40 MG tablet Take 1 tablet (40 mg total) by mouth 2 (two) times daily. 180 tablet 5  . potassium chloride (K-DUR)  10 MEQ tablet TAKE 1 TABLET BY MOUTH ONCE DAILY 90 tablet 1  . predniSONE (DELTASONE) 10 MG tablet Take 1 tablet (10 mg total) by mouth daily with breakfast. (Patient taking differently: Take 5 mg by mouth daily with breakfast. ) 30 tablet 5  . SYMBICORT 160-4.5 MCG/ACT inhaler INHALE 2 PUFFS BY MOUTH TWICE DAILY 1 Inhaler 5   No current facility-administered medications for this visit.     REVIEW OF SYSTEMS:    A 10+ POINT REVIEW OF SYSTEMS WAS OBTAINED including neurology, dermatology, psychiatry, cardiac, respiratory, lymph, extremities, GI, GU, Musculoskeletal, constitutional, breasts, reproductive, HEENT.  All pertinent positives are noted in the HPI.  All others are negative.   PHYSICAL EXAMINATION:  ECOG PERFORMANCE STATUS: 2-3  . Vitals:   12/09/17 1005  BP: (!) 148/66  Pulse: 71  Resp: 18  Temp: 97.9 F (36.6 C)  SpO2: 94%   Filed Weights   12/09/17 1005  Weight: (!) 373 lb 4.8 oz (169.3 kg)   .Body mass index is 60.25 kg/m.   . Wt Readings from Last 3 Encounters:  12/10/17 (!) 370 lb (167.8 kg)  12/09/17 (!) 373 lb 4.8 oz (169.3 kg)  11/18/17 (!) 373 lb (169.2 kg)   . GENERAL:alert, in no acute distress and comfortable SKIN: no acute rashes, no significant lesions EYES: conjunctiva are pink and non-injected, sclera anicteric OROPHARYNX: MMM, no exudates, no oropharyngeal erythema or ulceration NECK: supple, no JVD LYMPH:  no palpable lymphadenopathy in the cervical, axillary or inguinal regions LUNGS: clear to auscultation b/l with normal respiratory  effort HEART: regular rate & rhythm ABDOMEN:  normoactive bowel sounds , non tender, not distended. Extremity: no pedal edema PSYCH: alert & oriented x 3 with fluent speech NEURO: no focal motor/sensory deficits   LABORATORY DATA:  I have reviewed the data as listed  . CBC Latest Ref Rng & Units 12/09/2017 05/31/2017 11/26/2016  WBC 3.9 - 10.3 K/uL 6.0 5.3 5.5  Hemoglobin 11.6 - 15.9 g/dL 11.7 11.8 11.6  Hematocrit 34.8 - 46.6 % 38.2 37.5 36.9  Platelets 145 - 400 K/uL 174 163 149   . CBC    Component Value Date/Time   WBC 6.0 12/09/2017 0856   WBC 5.3 05/31/2017 1050   WBC 4.6 08/04/2016 1102   RBC 5.31 12/09/2017 0856   RBC 5.31 12/09/2017 0856   HGB 11.7 12/09/2017 0856   HGB 11.8 05/31/2017 1050   HCT 38.2 12/09/2017 0856   HCT 37.5 05/31/2017 1050   PLT 174 12/09/2017 0856   PLT 163 05/31/2017 1050   MCV 71.9 (L) 12/09/2017 0856   MCV 71.2 (L) 05/31/2017 1050   MCH 22.0 (L) 12/09/2017 0856   MCHC 30.6 (L) 12/09/2017 0856   RDW 15.2 (H) 12/09/2017 0856   RDW 15.1 (H) 05/31/2017 1050   LYMPHSABS 1.4 12/09/2017 0856   LYMPHSABS 1.2 05/31/2017 1050   MONOABS 0.4 12/09/2017 0856   MONOABS 0.4 05/31/2017 1050   EOSABS 0.6 (H) 12/09/2017 0856   EOSABS 0.7 (H) 05/31/2017 1050   BASOSABS 0.0 12/09/2017 0856   BASOSABS 0.0 05/31/2017 1050     . CMP Latest Ref Rng & Units 12/09/2017 09/24/2017 05/31/2017  Glucose 70 - 140 mg/dL 76 85 94  BUN 7 - 26 mg/dL 32(H) 29(H) 26.0  Creatinine 0.60 - 1.10 mg/dL 1.61(H) 1.44(H) 1.6(H)  Sodium 136 - 145 mmol/L 141 146(H) 142  Potassium 3.5 - 5.1 mmol/L 4.0 4.3 4.2  Chloride 98 - 109 mmol/L 103 102 -  CO2  22 - 29 mmol/L 31(H) 27 30(H)  Calcium 8.4 - 10.4 mg/dL 9.0 9.1 9.0  Total Protein 6.4 - 8.3 g/dL 7.1 - 7.1  Total Bilirubin 0.2 - 1.2 mg/dL 0.3 - 0.43  Alkaline Phos 40 - 150 U/L 145 - 143  AST 5 - 34 U/L 21 - 19  ALT 0 - 55 U/L 19 - 14    RADIOGRAPHIC STUDIES: I have personally reviewed the radiological images as listed  and agreed with the findings in the report. No results found.   ASSESSMENT & PLAN:   67 year old African-American female with   #1 history of right upper lobe T1b, N0, M0 primary lung adenocarcinoma most in December 2012. She was deemed not to be a surgical candidate. Treated by radiation oncology with SBRT at [facility]. Completed treatment in March 2013 and has been monitored since with no evidence of recurrence. no evidence of recurrence based on PET/CT scan done on 05/20/2015. On clinical visit today the patient has no new change in her breathing and no new focal symptoms.  Hemoglobin is stable.   CT chest 05/26/2016 shows no evidence of local recurrence of cancer or metastatic disease. CT Chest, 05/31/2017 shows no evidence of local recurrence of cancer or metastatic disease.  Plan  -labs stable -No overt clinical symptoms suggestive of recurrent lung cancer at this time. -We will plan to follow-up the patient in 6 months for an H&P, Ct chest and repeat labs unless any new symptoms arise prior to this.  #2 Right anterior chest wall slowly growing masslike lesion in the right pectoralis minor muscle. Unclear etiology. FNA showed skeletal muscles, macrophages and spindle cells some with cytologic atypia. Needle core biopsy showed soft tissue and skeletal muscle with extensive necrosis; rare spindle cells with cytologic atypia. Treatment effect causing reactive atypia cannot be ruled out.  PET/CT scan done here on 05/20/2015 shows no change in metabolic activity or size of the medial right chest wall mass suggesting benign etiology. Could certainly be related to her sarcoidosis . Previously discussed with the patient the option of biopsying the mass for a more definitive diagnosis versus monitoring it. She chooses to monitor it at this time given that imaging findings suggest a likely benign etiology that hasnt changed over the last 18 months. CT chest 05/26/2016 shows that this lesion  is stable compared to previous imaging. -CT Chest, 05/31/2017 shows that this lesion is stable. No locally recurrent or metastatic disease.  -Discussed pt labwork today, 12/09/17; blood counts and chemistries are stable.  -The pt shows no clinical or lab progression of lung cancer at this time.  -No indication for further treatment at this time.  -Will continue routine CT chest once each year  -Will see pt back in 6 months  #3 history of extensive sarcoidosis involving lymphadenopathy in the chest, abdomen as well as spleen and possibly liver .previously treated with steroids by Dr. Bartholome Bill, pulmonary at [facility] .has recently established care with Dr. Melvyn Novas for her pulmonary and sarcoidosis management.   Plan -Continue followup with Dr. Melvyn Novas For further management   #5 history of sickle cell trait. Hemoglobin electrophoresis is not available. Hgb Improved from 9.9 to 11.3. Microcytosis likely from chronic disease from her sarcoidosis vs possible alpha thal trait. Ferritin levels WNL  Plan   -No indication for additional iron replacement at this time.   Ct chest without contrast in 24 weeks RTC with Dr Irene Limbo with labs in 25 weeks    All  of the patients questions were answered  to her apparent satisfaction. The patient knows to call the clinic with any problems, questions or concerns.  . The total time spent in the appointment was 20 minutes and more than 50% was on counseling and direct patient cares.      Sullivan Lone MD [place] AAHIVMS [facility] Hematology/Oncology Physician [place]  (Office):       [phone] (Work cell):  [phone] (Fax):           [phone]  This document serves as a record of services personally performed by Sullivan Lone, MD. It was created on his behalf by Baldwin [place], a trained medical scribe. The creation of this record is based on the scribe's personal observations and the provider's statements to them.   .I have reviewed  the above documentation for accuracy and completeness, and I agree with the above. Brunetta Genera MD Pamela

## 2017-12-09 ENCOUNTER — Encounter: Payer: Self-pay | Admitting: Hematology

## 2017-12-09 ENCOUNTER — Inpatient Hospital Stay: Payer: Medicare Other | Attending: Hematology

## 2017-12-09 ENCOUNTER — Telehealth: Payer: Self-pay | Admitting: Hematology

## 2017-12-09 ENCOUNTER — Inpatient Hospital Stay (HOSPITAL_BASED_OUTPATIENT_CLINIC_OR_DEPARTMENT_OTHER): Payer: Medicare Other | Admitting: Hematology

## 2017-12-09 VITALS — BP 148/66 | HR 71 | Temp 97.9°F | Resp 18 | Ht 66.0 in | Wt 373.3 lb

## 2017-12-09 DIAGNOSIS — M1990 Unspecified osteoarthritis, unspecified site: Secondary | ICD-10-CM | POA: Insufficient documentation

## 2017-12-09 DIAGNOSIS — D573 Sickle-cell trait: Secondary | ICD-10-CM

## 2017-12-09 DIAGNOSIS — Z7952 Long term (current) use of systemic steroids: Secondary | ICD-10-CM | POA: Diagnosis not present

## 2017-12-09 DIAGNOSIS — N189 Chronic kidney disease, unspecified: Secondary | ICD-10-CM

## 2017-12-09 DIAGNOSIS — Z808 Family history of malignant neoplasm of other organs or systems: Secondary | ICD-10-CM | POA: Insufficient documentation

## 2017-12-09 DIAGNOSIS — D869 Sarcoidosis, unspecified: Secondary | ICD-10-CM

## 2017-12-09 DIAGNOSIS — Z923 Personal history of irradiation: Secondary | ICD-10-CM

## 2017-12-09 DIAGNOSIS — I130 Hypertensive heart and chronic kidney disease with heart failure and stage 1 through stage 4 chronic kidney disease, or unspecified chronic kidney disease: Secondary | ICD-10-CM

## 2017-12-09 DIAGNOSIS — Z8049 Family history of malignant neoplasm of other genital organs: Secondary | ICD-10-CM

## 2017-12-09 DIAGNOSIS — J449 Chronic obstructive pulmonary disease, unspecified: Secondary | ICD-10-CM | POA: Diagnosis not present

## 2017-12-09 DIAGNOSIS — I509 Heart failure, unspecified: Secondary | ICD-10-CM | POA: Insufficient documentation

## 2017-12-09 DIAGNOSIS — M109 Gout, unspecified: Secondary | ICD-10-CM

## 2017-12-09 DIAGNOSIS — Z85118 Personal history of other malignant neoplasm of bronchus and lung: Secondary | ICD-10-CM

## 2017-12-09 DIAGNOSIS — Z7982 Long term (current) use of aspirin: Secondary | ICD-10-CM | POA: Diagnosis not present

## 2017-12-09 DIAGNOSIS — Z800 Family history of malignant neoplasm of digestive organs: Secondary | ICD-10-CM

## 2017-12-09 DIAGNOSIS — Z807 Family history of other malignant neoplasms of lymphoid, hematopoietic and related tissues: Secondary | ICD-10-CM | POA: Insufficient documentation

## 2017-12-09 DIAGNOSIS — Z79899 Other long term (current) drug therapy: Secondary | ICD-10-CM | POA: Insufficient documentation

## 2017-12-09 DIAGNOSIS — C3410 Malignant neoplasm of upper lobe, unspecified bronchus or lung: Secondary | ICD-10-CM

## 2017-12-09 LAB — CBC WITH DIFFERENTIAL (CANCER CENTER ONLY)
Basophils Absolute: 0 10*3/uL (ref 0.0–0.1)
Basophils Relative: 0 %
EOS ABS: 0.6 10*3/uL — AB (ref 0.0–0.5)
EOS PCT: 11 %
HCT: 38.2 % (ref 34.8–46.6)
Hemoglobin: 11.7 g/dL (ref 11.6–15.9)
LYMPHS ABS: 1.4 10*3/uL (ref 0.9–3.3)
Lymphocytes Relative: 23 %
MCH: 22 pg — AB (ref 25.1–34.0)
MCHC: 30.6 g/dL — AB (ref 31.5–36.0)
MCV: 71.9 fL — ABNORMAL LOW (ref 79.5–101.0)
MONO ABS: 0.4 10*3/uL (ref 0.1–0.9)
MONOS PCT: 6 %
Neutro Abs: 3.6 10*3/uL (ref 1.5–6.5)
Neutrophils Relative %: 60 %
PLATELETS: 174 10*3/uL (ref 145–400)
RBC: 5.31 MIL/uL (ref 3.70–5.45)
RDW: 15.2 % — AB (ref 11.2–14.5)
WBC Count: 6 10*3/uL (ref 3.9–10.3)

## 2017-12-09 LAB — COMPREHENSIVE METABOLIC PANEL
ALT: 19 U/L (ref 0–55)
AST: 21 U/L (ref 5–34)
Albumin: 3.4 g/dL — ABNORMAL LOW (ref 3.5–5.0)
Alkaline Phosphatase: 145 U/L (ref 40–150)
Anion gap: 7 (ref 3–11)
BUN: 32 mg/dL — ABNORMAL HIGH (ref 7–26)
CO2: 31 mmol/L — ABNORMAL HIGH (ref 22–29)
Calcium: 9 mg/dL (ref 8.4–10.4)
Chloride: 103 mmol/L (ref 98–109)
Creatinine, Ser: 1.61 mg/dL — ABNORMAL HIGH (ref 0.60–1.10)
GFR calc Af Amer: 38 mL/min — ABNORMAL LOW (ref >60)
GFR calc non Af Amer: 33 mL/min — ABNORMAL LOW (ref >60)
Glucose, Bld: 76 mg/dL (ref 70–140)
Potassium: 4 mmol/L (ref 3.5–5.1)
Sodium: 141 mmol/L (ref 136–145)
Total Bilirubin: 0.3 mg/dL (ref 0.2–1.2)
Total Protein: 7.1 g/dL (ref 6.4–8.3)

## 2017-12-09 LAB — RETICULOCYTES
RBC.: 5.31 MIL/uL (ref 3.70–5.45)
Retic Count, Absolute: 85 10*3/uL (ref 33.7–90.7)
Retic Ct Pct: 1.6 % (ref 0.7–2.1)

## 2017-12-09 NOTE — Telephone Encounter (Signed)
Scheduled appt per 5/23 los - Gave patient aVS and calender per los - central radiology to contact pt with ct scan

## 2017-12-10 ENCOUNTER — Encounter: Payer: Self-pay | Admitting: Family Medicine

## 2017-12-10 ENCOUNTER — Ambulatory Visit (INDEPENDENT_AMBULATORY_CARE_PROVIDER_SITE_OTHER): Payer: Medicare Other | Admitting: Family Medicine

## 2017-12-10 VITALS — BP 135/56 | HR 66 | Temp 98.3°F | Resp 16 | Ht 66.0 in | Wt 370.0 lb

## 2017-12-10 DIAGNOSIS — J069 Acute upper respiratory infection, unspecified: Secondary | ICD-10-CM

## 2017-12-10 DIAGNOSIS — I10 Essential (primary) hypertension: Secondary | ICD-10-CM | POA: Diagnosis not present

## 2017-12-10 DIAGNOSIS — D869 Sarcoidosis, unspecified: Secondary | ICD-10-CM | POA: Diagnosis not present

## 2017-12-10 DIAGNOSIS — G252 Other specified forms of tremor: Secondary | ICD-10-CM | POA: Diagnosis not present

## 2017-12-10 DIAGNOSIS — R7303 Prediabetes: Secondary | ICD-10-CM

## 2017-12-10 MED ORDER — AMOXICILLIN-POT CLAVULANATE 875-125 MG PO TABS: 1.0000 | ORAL_TABLET | Freq: Two times a day (BID) | ORAL | 0 refills | Status: AC

## 2017-12-10 MED ORDER — ALBUTEROL SULFATE (2.5 MG/3ML) 0.083% IN NEBU: 2.5000 mg | INHALATION_SOLUTION | Freq: Four times a day (QID) | RESPIRATORY_TRACT | 12 refills | Status: DC | PRN

## 2017-12-10 NOTE — Progress Notes (Signed)
Pamela [name], a 67 year old female with a history of hypertension, morbid obesity, OSA, and sarcoidosis presents complaining of upper respiratory symptoms. She says that symptoms have been worsening over the past several weeks. Symptoms include productive cough, ear pressure, post nasal drip and nasal congestion. Patient has been using OTC analgesics to alleviate symptoms without relief. She is tolerating fluids well.   Patient is also complaining of tremor primarily to left hand. She says that tremor was noticed by her family several months ago. She experiencing a rhythmic shaking of hands with fine movements. Also, shaking when trying to control objects or utensils.  She denies recent falls, dizziness, blurred vision, double vision, weakness, or gait abnormalities.   Past Medical History:  Diagnosis Date  . Arthritis   . Asthma   . Cancer ([place])    lung, adenocarcinoma right lung 2012  . CHF (congestive heart failure) (HCC)    Preserved EF  . Congenital single kidney    With chronic kidney disease  . COPD (chronic obstructive pulmonary disease) ([place])   . Gout   . Hypertension   . Lung cancer [facility]) 2012   Right upper lobe lung adenocarcinoma diagnosed with needle biopsy treated by SBRT finished treatment April 2013 has been monitored since  . Mass of chest wall, right    Right chest wall mass 7.3 cm biopsy on 12/13/2013. Patient notes it was consistent with sarcoidosis but actual pathology results not available.  . Oxygen deficiency   . Sarcoidosis   . Sickle cell trait ([place])   . Sleep apnea    Social History   Socioeconomic History  . Marital status: Divorced    Spouse name: Not on file  . Number of children: 2  . Years of education: Not on file  . Highest education level: Not on file  Occupational History  . Not on file  Social Needs  . Financial resource strain: Not on file  . Food insecurity:    Worry: Not on file    Inability: Not on file  . Transportation needs:   Medical: Not on file    Non-medical: Not on file  Tobacco Use  . Smoking status: Never Smoker  . Smokeless tobacco: Never Used  Substance and Sexual Activity  . Alcohol use: No    Alcohol/week: 0.0 oz  . Drug use: No  . Sexual activity: Not on file  Lifestyle  . Physical activity:    Days per week: Not on file    Minutes per session: Not on file  . Stress: Not on file  Relationships  . Social connections:    Talks on phone: Not on file    Gets together: Not on file    Attends religious service: Not on file    Active member of club or organization: Not on file    Attends meetings of clubs or organizations: Not on file    Relationship status: Not on file  . Intimate partner violence:    Fear of current or ex partner: Not on file    Emotionally abused: Not on file    Physically abused: Not on file    Forced sexual activity: Not on file  Other Topics Concern  . Not on file  Social History Narrative   Lives with son.  One son is deceased.     Immunization History  Administered Date(s) Administered  . Influenza,inj,Quad PF,6+ Mos 04/13/2014, 04/16/2015, 04/03/2016, 05/19/2017  . Pneumococcal Conjugate-13 04/03/2016  . Pneumococcal Polysaccharide-23 04/13/2014  .  Tdap 01/02/2015  . Zoster 05/21/2014  Review of Systems  Constitutional: Negative.   HENT: Negative.   Eyes: Negative.   Respiratory: Positive for cough and sputum production.   Cardiovascular: Negative.   Gastrointestinal: Negative.   Genitourinary: Negative.   Musculoskeletal: Negative.   Skin: Negative.   Neurological: Negative.        Fine tremor  Endo/Heme/Allergies: Negative.   Psychiatric/Behavioral: Negative.   Physical Exam  Constitutional: She appears well-developed and well-nourished.  Morbid obesity  Eyes: Pupils are equal, round, and reactive to light.  Cardiovascular: Normal rate, regular rhythm and normal heart sounds.  Pulmonary/Chest: Effort normal and breath sounds normal.  Abdominal:  Soft. Bowel sounds are normal.  Abdominal obesity  Musculoskeletal: Normal range of motion.  Neurological: She is alert. She displays tremor (left hand tremor).  Skin: Skin is warm and dry.  Psychiatric: She has a normal mood and affect. Her speech is normal and behavior is normal. Judgment and thought content normal. Cognition and memory are normal.   BP (!) 135/56 (BP Location: Right Arm, Patient Position: Sitting, Cuff Size: Large)   Pulse 66   Temp 98.3 F (36.8 C) (Oral)   Resp 16   Ht 5\' 6"  (1.676 m)   Wt (!) 370 lb (167.8 kg)   SpO2 98%   BMI 59.72 kg/m   Plan   1. Essential hypertension Blood pressure is at goal on current medication regimen regimen.  - Continue medication, monitor blood pressure at home. Continue DASH diet.Reminder to go to the ER if any CP, SOB, nausea, dizziness, severe HA, changes vision/speech, left arm numbness and tingling and jaw pain.  2. Fine tremor Tremor present on physical exam. Patient warrants referral to neurology for further work up and evaluation.  - Ambulatory referral to Neurology  3. Prediabetes Most recent hemoglobin a1C was 5.6, will recheck in 3 months.  Continue carbohydrate modified diet.   4. Upper respiratory tract infection, unspecified type - amoxicillin-clavulanate (AUGMENTIN) 875-125 MG tablet; Take 1 tablet by mouth 2 (two) times daily for 10 days.  Dispense: 20 tablet; Refill: 0  5. Sarcoidosis - albuterol (PROVENTIL) (2.5 MG/3ML) 0.083% nebulizer solution; Take 3 mLs (2.5 mg total) by nebulization every 6 (six) hours as needed for wheezing or shortness of breath.  Dispense: 75 mL; Refill: 12    RTC: 3 months for chronic conditions   [name]  MSN, FNP-C [facility] [address] [place], [place] [address] [phone]

## 2017-12-10 NOTE — Patient Instructions (Signed)
Your blood pressure is at goal on current medication regimen no medication changes warranted on today. For new onset fine tremor, I recommend neurology consult for further work-up and evaluation.    I suspect that you have an upper respiratory infection due to the length of time that cough is been present.  We will start a trial of Augmentin 875-125 every 12 hours for 10 days.  Make sure you increase your rest, handwashing, and fluid intake.  If symptoms continue to persist, please contact office you may warrant a stat chest x-ray.

## 2017-12-16 ENCOUNTER — Other Ambulatory Visit: Payer: Medicare Other

## 2017-12-16 ENCOUNTER — Other Ambulatory Visit: Payer: Self-pay | Admitting: Family Medicine

## 2017-12-16 ENCOUNTER — Ambulatory Visit: Payer: Medicare Other | Admitting: Hematology

## 2017-12-16 DIAGNOSIS — M109 Gout, unspecified: Secondary | ICD-10-CM

## 2017-12-20 ENCOUNTER — Telehealth: Payer: Self-pay

## 2017-12-20 DIAGNOSIS — M109 Gout, unspecified: Secondary | ICD-10-CM

## 2017-12-20 MED ORDER — FEBUXOSTAT 40 MG PO TABS: ORAL_TABLET | ORAL | 4 refills | Status: DC

## 2017-12-20 NOTE — Telephone Encounter (Signed)
Refill sent into pharmacy. Thanks!  

## 2017-12-23 ENCOUNTER — Telehealth: Payer: Self-pay

## 2017-12-24 ENCOUNTER — Telehealth: Payer: Self-pay | Admitting: Family Medicine

## 2017-12-24 NOTE — Telephone Encounter (Signed)
Pamela [name], a 67 year old female with multiple comorbidities called office requesting a call back from provider.  Spoke with patient who is complaining of upper respiratory symptoms over the past 3 weeks.  Upper respiratory symptoms include wheezing, shortness of breath, and productive cough.  Patient was previously evaluated in office on 12/10/2017 and was treated with azithromycin.  Took medication in its entirety without sustained relief.  Advised patient to report to x-ray department for a stat chest x-ray on today.  Patient states that she does not have transportation, so advised to report to the emergency department for further work up and evaluation.  [name]  MSN, FNP-C [facility] [address] [place], [place] [address] [phone]

## 2017-12-27 ENCOUNTER — Other Ambulatory Visit: Payer: Self-pay | Admitting: Family Medicine

## 2017-12-27 ENCOUNTER — Ambulatory Visit (HOSPITAL_COMMUNITY)
Admission: RE | Admit: 2017-12-27 | Discharge: 2017-12-27 | Disposition: A | Discharge status: Home / Self Care | Payer: Medicare Other | Source: Ambulatory Visit | Attending: Family Medicine | Admitting: Family Medicine

## 2017-12-27 DIAGNOSIS — M109 Gout, unspecified: Secondary | ICD-10-CM

## 2017-12-27 DIAGNOSIS — R05 Cough: Secondary | ICD-10-CM | POA: Insufficient documentation

## 2017-12-27 DIAGNOSIS — J3089 Other allergic rhinitis: Secondary | ICD-10-CM

## 2017-12-27 DIAGNOSIS — R0600 Dyspnea, unspecified: Secondary | ICD-10-CM

## 2017-12-27 DIAGNOSIS — R0602 Shortness of breath: Secondary | ICD-10-CM | POA: Diagnosis not present

## 2017-12-27 MED ORDER — FEBUXOSTAT 40 MG PO TABS
ORAL_TABLET | ORAL | 4 refills | Status: DC
Start: 2017-12-27 — End: 2018-03-14

## 2017-12-27 MED ORDER — CETIRIZINE HCL 10 MG PO TABS: 10.0000 mg | ORAL_TABLET | Freq: Every day | ORAL | 11 refills | Status: DC

## 2017-12-27 MED ORDER — FLUTICASONE PROPIONATE 50 MCG/ACT NA SUSP: 2.0000 | Freq: Every day | NASAL | 6 refills | Status: DC

## 2017-12-27 NOTE — Progress Notes (Signed)
   Meds ordered this encounter  Medications  . cetirizine (ZYRTEC) 10 MG tablet    Sig: Take 1 tablet (10 mg total) by mouth daily.    Dispense:  30 tablet    Refill:  11  . fluticasone (FLONASE) 50 MCG/ACT nasal spray    Sig: Place 2 sprays into both nostrils daily.    Dispense:  16 g    Refill:  [place]  MSN, FNP-C [facility] [address] [place], [place] [address] [phone]

## 2017-12-27 NOTE — Progress Notes (Signed)
Orders Placed This Encounter  Procedures  . DG Chest 2 View    Standing Status:   Future    Standing Expiration Date:   02/27/2019    Order Specific Question:   Reason for Exam (SYMPTOM  OR DIAGNOSIS REQUIRED)    Answer:   dysnea, persistent cough    Order Specific Question:   Preferred imaging location?    Answer:   [facility]    Order Specific Question:   Radiology Contrast Protocol - do NOT remove file path    Answer:   [email or web address]    [name]  MSN, FNP-C [facility] [address] [place], [place] [address] [phone]

## 2018-01-03 ENCOUNTER — Ambulatory Visit (INDEPENDENT_AMBULATORY_CARE_PROVIDER_SITE_OTHER): Payer: Medicare Other | Admitting: Family Medicine

## 2018-01-03 ENCOUNTER — Encounter: Payer: Self-pay | Admitting: Family Medicine

## 2018-01-03 VITALS — BP 140/68 | HR 71 | Temp 98.2°F | Resp 16 | Ht 66.0 in | Wt 369.0 lb

## 2018-01-03 DIAGNOSIS — R05 Cough: Secondary | ICD-10-CM

## 2018-01-03 DIAGNOSIS — R058 Other specified cough: Secondary | ICD-10-CM

## 2018-01-03 MED ORDER — GUAIFENESIN ER 600 MG PO TB12: 600.0000 mg | ORAL_TABLET | Freq: Two times a day (BID) | ORAL | 0 refills | Status: AC

## 2018-01-03 NOTE — Patient Instructions (Signed)

## 2018-01-03 NOTE — Progress Notes (Signed)
Subjective:    Patient ID: Pamela [name], female    DOB: May 29, 1951, 67 y.o.   MRN: [id number]  Pamela [name], a 67 year old female with multiple comorbidities presents complaining of productive, persistent cough over the past month.  Patient was evaluated in the office 1 month ago for upper respiratory symptoms.  She was treated with Augmentin 875-125 mg every 12 hours for 10 days.  Also, chest x-ray reviewed no acute cardiopulmonary symptoms present.  Patient has also been taking daily antihistamine and steroid nasal spray with moderate relief.  Cough  The current episode started 1 to 4 weeks ago. The problem has been gradually improving. The cough is productive of sputum. Associated symptoms include wheezing (occasional). Pertinent negatives include no heartburn, hemoptysis or shortness of breath. The symptoms are aggravated by exercise and lying down. She has tried a beta-agonist inhaler and OTC cough suppressant for the symptoms. Her past medical history is significant for environmental allergies. There is no history of bronchiectasis, bronchitis, emphysema or pneumonia.   Pamela [name], a 67 year old female with multiple comorbidities presents complaining of productive cough.   Past Medical History:  Diagnosis Date  . Arthritis   . Asthma   . Cancer ([place])    lung, adenocarcinoma right lung 2012  . CHF (congestive heart failure) (HCC)    Preserved EF  . Congenital single kidney    With chronic kidney disease  . COPD (chronic obstructive pulmonary disease) ([place])   . Gout   . Hypertension   . Lung cancer [facility]) 2012   Right upper lobe lung adenocarcinoma diagnosed with needle biopsy treated by SBRT finished treatment April 2013 has been monitored since  . Mass of chest wall, right    Right chest wall mass 7.3 cm biopsy on 12/13/2013. Patient notes it was consistent with sarcoidosis but actual pathology results not available.  . Oxygen deficiency   . Sarcoidosis   . Sickle cell  trait ([place])   . Sleep apnea    Social History   Socioeconomic History  . Marital status: Divorced    Spouse name: Not on file  . Number of children: 2  . Years of education: Not on file  . Highest education level: Not on file  Occupational History  . Not on file  Social Needs  . Financial resource strain: Not on file  . Food insecurity:    Worry: Not on file    Inability: Not on file  . Transportation needs:    Medical: Not on file    Non-medical: Not on file  Tobacco Use  . Smoking status: Never Smoker  . Smokeless tobacco: Never Used  Substance and Sexual Activity  . Alcohol use: No    Alcohol/week: 0.0 oz  . Drug use: No  . Sexual activity: Not on file  Lifestyle  . Physical activity:    Days per week: Not on file    Minutes per session: Not on file  . Stress: Not on file  Relationships  . Social connections:    Talks on phone: Not on file    Gets together: Not on file    Attends religious service: Not on file    Active member of club or organization: Not on file    Attends meetings of clubs or organizations: Not on file    Relationship status: Not on file  . Intimate partner violence:    Fear of current or ex partner: Not on file    Emotionally abused: Not on  file    Physically abused: Not on file    Forced sexual activity: Not on file  Other Topics Concern  . Not on file  Social History Narrative   Lives with son.  One son is deceased.     Immunization History  Administered Date(s) Administered  . Influenza,inj,Quad PF,6+ Mos 04/13/2014, 04/16/2015, 04/03/2016, 05/19/2017  . Pneumococcal Conjugate-13 04/03/2016  . Pneumococcal Polysaccharide-23 04/13/2014  . Tdap 01/02/2015  . Zoster 05/21/2014   Allergies  Allergen Reactions  . Sulfur Rash    Review of Systems  Respiratory: Positive for cough and wheezing (occasional). Negative for hemoptysis and shortness of breath.   Gastrointestinal: Negative for heartburn.  Endocrine: Negative.  Negative  for polydipsia, polyphagia and polyuria.  Genitourinary: Negative.   Musculoskeletal: Negative.   Skin: Negative.   Allergic/Immunologic: Positive for environmental allergies.  Neurological: Negative.   Hematological: Negative.   Psychiatric/Behavioral: Negative.       Objective:   Physical Exam  Constitutional: She is oriented to person, place, and time. She appears well-developed and well-nourished.  Morbid obesity  Eyes: Pupils are equal, round, and reactive to light.  Neck: Normal range of motion.  Pulmonary/Chest: Effort normal and breath sounds normal.  Abdominal: Soft. Bowel sounds are normal.  Abdominal obesity  Neurological: She is alert and oriented to person, place, and time.  Skin: Skin is warm and dry.  Psychiatric: She has a normal mood and affect. Her behavior is normal. Judgment and thought content normal.      BP 140/68 (BP Location: Left Wrist, Patient Position: Sitting, Cuff Size: Normal)   Pulse 71   Temp 98.2 F (36.8 C) (Oral)   Resp 16   Ht 5\' 6"  (1.676 m)   Wt (!) 369 lb (167.4 kg)   SpO2 99%   BMI 59.56 kg/m  Assessment & Plan:   Productive cough Patient has completed antibiotic therapy Augmentin 875-125 mg twice daily for 10 days. Also reviewed chest x-ray, no acute cardiopulmonary process For productive cough, will start a trial of guaifenesin 600 mg every 12 hours for 10 days.  Also, patient has a history of sarcoidosis, I recommend scheduling first available appointment with pulmonology. - guaiFENesin (MUCINEX) 600 MG 12 hr tablet; Take 1 tablet (600 mg total) by mouth 2 (two) times daily for 10 days.  Dispense: 20 tablet; Refill: 0   [name]  MSN, FNP-C Patient Pamela [name] [address] [place], [place] [address] [phone]

## 2018-01-11 DIAGNOSIS — C3490 Malignant neoplasm of unspecified part of unspecified bronchus or lung: Secondary | ICD-10-CM | POA: Diagnosis not present

## 2018-01-11 DIAGNOSIS — I129 Hypertensive chronic kidney disease with stage 1 through stage 4 chronic kidney disease, or unspecified chronic kidney disease: Secondary | ICD-10-CM | POA: Diagnosis not present

## 2018-01-11 DIAGNOSIS — N2581 Secondary hyperparathyroidism of renal origin: Secondary | ICD-10-CM | POA: Diagnosis not present

## 2018-01-11 DIAGNOSIS — J449 Chronic obstructive pulmonary disease, unspecified: Secondary | ICD-10-CM | POA: Diagnosis not present

## 2018-01-11 DIAGNOSIS — I509 Heart failure, unspecified: Secondary | ICD-10-CM | POA: Diagnosis not present

## 2018-01-11 DIAGNOSIS — M109 Gout, unspecified: Secondary | ICD-10-CM | POA: Diagnosis not present

## 2018-01-11 DIAGNOSIS — D631 Anemia in chronic kidney disease: Secondary | ICD-10-CM | POA: Diagnosis not present

## 2018-01-11 DIAGNOSIS — J45909 Unspecified asthma, uncomplicated: Secondary | ICD-10-CM | POA: Diagnosis not present

## 2018-01-11 DIAGNOSIS — D869 Sarcoidosis, unspecified: Secondary | ICD-10-CM | POA: Diagnosis not present

## 2018-01-11 DIAGNOSIS — N189 Chronic kidney disease, unspecified: Secondary | ICD-10-CM | POA: Diagnosis not present

## 2018-01-11 DIAGNOSIS — E8881 Metabolic syndrome: Secondary | ICD-10-CM | POA: Diagnosis not present

## 2018-01-12 ENCOUNTER — Other Ambulatory Visit: Payer: Self-pay | Admitting: Family Medicine

## 2018-01-12 DIAGNOSIS — I5032 Chronic diastolic (congestive) heart failure: Secondary | ICD-10-CM

## 2018-01-15 ENCOUNTER — Other Ambulatory Visit: Payer: Self-pay | Admitting: Internal Medicine

## 2018-01-17 ENCOUNTER — Telehealth: Payer: Self-pay

## 2018-01-17 DIAGNOSIS — I5032 Chronic diastolic (congestive) heart failure: Secondary | ICD-10-CM

## 2018-01-17 MED ORDER — FUROSEMIDE 40 MG PO TABS: 40.0000 mg | ORAL_TABLET | Freq: Two times a day (BID) | ORAL | 5 refills | Status: DC

## 2018-01-17 NOTE — Telephone Encounter (Signed)
Refill sent into pharmacy. Thanks!  

## 2018-01-19 ENCOUNTER — Telehealth: Payer: Self-pay

## 2018-01-19 DIAGNOSIS — I5032 Chronic diastolic (congestive) heart failure: Secondary | ICD-10-CM

## 2018-01-19 MED ORDER — FUROSEMIDE 40 MG PO TABS: 40.0000 mg | ORAL_TABLET | Freq: Two times a day (BID) | ORAL | 5 refills | Status: DC

## 2018-01-19 NOTE — Telephone Encounter (Signed)
Refill sent in

## 2018-01-26 ENCOUNTER — Telehealth: Payer: Self-pay | Admitting: Neurology

## 2018-01-26 ENCOUNTER — Encounter: Payer: Self-pay | Admitting: Neurology

## 2018-01-26 ENCOUNTER — Ambulatory Visit (INDEPENDENT_AMBULATORY_CARE_PROVIDER_SITE_OTHER): Payer: Medicare Other | Admitting: Neurology

## 2018-01-26 VITALS — BP 148/81 | HR 73 | Ht 66.0 in | Wt 371.0 lb

## 2018-01-26 DIAGNOSIS — R251 Tremor, unspecified: Secondary | ICD-10-CM

## 2018-01-26 NOTE — Progress Notes (Signed)
Subjective:    Patient ID: Pamela [name] is a 67 y.o. female.  HPI     Star Age, MD, PhD [facility] [address], [address] [place], [place] [address]  Dear Lovett Sox,   I saw your patient, Pamela [name], upon your kind request in my neurologic clinic today for initial consultation of her tremors. The patient is unaccompanied today. As you know, Pamela [name] is a 67 year old left-handed woman with an underlying medical history of asthma/COPD, lung cancer status post radiation therapy, arthritis, sarcoidosis, congestive heart failure, congenital single kidney with chronic kidney disease, gout, sleep apnea and morbid obesity with BMI of over 50, who reports a several month history of tremor affecting her left more than right arm/hand. She lives with her son and his family. She lost her other son at age 37. She has no family history of tremors or Parkinson's disease. The tremor was noted first by her son about 6 months ago, it is intermittent and primarily in her left hand, not at rest, does not bother her typically. She is a nonsmoker and does not utilize alcohol. She drinks caffeine occasionally in the form of iced tea or soda, not daily.  Her Her Past Medical History Is Significant For: Past Medical History:  Diagnosis Date  . Arthritis   . Asthma   . Cancer ([place])    lung, adenocarcinoma right lung 2012  . CHF (congestive heart failure) (HCC)    Preserved EF  . Congenital single kidney    With chronic kidney disease  . COPD (chronic obstructive pulmonary disease) ([place])   . Gout   . Hypertension   . Lung cancer [facility]) 2012   Right upper lobe lung adenocarcinoma diagnosed with needle biopsy treated by SBRT finished treatment April 2013 has been monitored since  . Mass of chest wall, right    Right chest wall mass 7.3 cm biopsy on 12/13/2013. Patient notes it was consistent with sarcoidosis but actual pathology results not available.  . Oxygen  deficiency   . Sarcoidosis   . Sickle cell trait ([place])   . Sleep apnea     Her Past Surgical History Is Significant For: Past Surgical History:  Procedure Laterality Date  . LUNG BIOPSY    . TUBAL LIGATION    . VEIN LIGATION AND STRIPPING      Her Family History Is Significant For: Family History  Problem Relation Age of Onset  . Hypertension Mother   . Renal Disease Mother   . Cancer Father        stomach  . Heart disease Father        No details  . Cancer Brother   . Diabetes Brother   . Cervical cancer Sister   . Diabetes Sister   . Multiple myeloma Sister     Her Social History Is Significant For: Social History   Socioeconomic History  . Marital status: Divorced    Spouse name: Not on file  . Number of children: 2  . Years of education: Not on file  . Highest education level: Not on file  Occupational History  . Not on file  Social Needs  . Financial resource strain: Not on file  . Food insecurity:    Worry: Not on file    Inability: Not on file  . Transportation needs:    Medical: Not on file    Non-medical: Not on file  Tobacco Use  . Smoking status: Never Smoker  . Smokeless  tobacco: Never Used  Substance and Sexual Activity  . Alcohol use: No    Alcohol/week: 0.0 oz  . Drug use: No  . Sexual activity: Not on file  Lifestyle  . Physical activity:    Days per week: Not on file    Minutes per session: Not on file  . Stress: Not on file  Relationships  . Social connections:    Talks on phone: Not on file    Gets together: Not on file    Attends religious service: Not on file    Active member of club or organization: Not on file    Attends meetings of clubs or organizations: Not on file    Relationship status: Not on file  Other Topics Concern  . Not on file  Social History Narrative   Lives with son.  One son is deceased.      Her Allergies Are:  Allergies  Allergen Reactions  . Sulfur Rash  :   Her Current Medications Are:   Outpatient Encounter Medications as of 01/26/2018  Medication Sig  . acetaminophen (TYLENOL) 500 MG tablet Take 1 tablet (500 mg total) by mouth every 6 (six) hours as needed.  [name] Kitchen albuterol (PROAIR HFA) 108 (90 Base) MCG/ACT inhaler Inhale 2 puffs into the lungs every 6 (six) hours as needed for wheezing or shortness of breath.  [name] Kitchen albuterol (PROVENTIL) (2.5 MG/3ML) 0.083% nebulizer solution Take 3 mLs (2.5 mg total) by nebulization every 6 (six) hours as needed for wheezing or shortness of breath.  [name] Kitchen aspirin 81 MG tablet Take 1 tablet (81 mg total) by mouth daily.  . Calcium Carbonate-Vitamin D (CALTRATE 600+D) 600-400 MG-UNIT tablet Take 1 tablet by mouth daily.  . cetirizine (ZYRTEC) 10 MG tablet Take 1 tablet (10 mg total) by mouth daily.  [name] Kitchen docusate sodium (COLACE) 100 MG capsule Take 1 capsule (100 mg total) by mouth daily.  . febuxostat (ULORIC) 40 MG tablet TAKE 1 TABLET (40 MG TOTAL) BY MOUTH DAILY.  . fluticasone (FLONASE) 50 MCG/ACT nasal spray Place 2 sprays into both nostrils daily.  . furosemide (LASIX) 40 MG tablet Take 1 tablet (40 mg total) by mouth 2 (two) times daily.  [name] Kitchen oxymetazoline (AFRIN) 0.05 % nasal spray Place 1 spray into both nostrils 2 (two) times daily.  . potassium chloride (K-DUR) 10 MEQ tablet TAKE 1 TABLET BY MOUTH ONCE DAILY  . predniSONE (DELTASONE) 10 MG tablet Take 1 tablet (10 mg total) by mouth daily with breakfast. (Patient taking differently: Take 5 mg by mouth daily with breakfast. )  . SYMBICORT 160-4.5 MCG/ACT inhaler INHALE 2 PUFFS BY MOUTH TWICE DAILY   No facility-administered encounter medications on file as of 01/26/2018.   :   Review of Systems:  Out of a complete 14 point review of systems, all are reviewed and negative with the exception of these symptoms as listed below:  Review of Systems  Neurological:       Pt presents today to discuss her tremor in her left hand that has been present for several months now. She is left handed.     Objective:  Neurological Exam  Physical Exam Physical Examination:   Vitals:   01/26/18 0932  BP: (!) 148/81  Pulse: 73   General Examination: The patient is a very pleasant 67 y.o. female in no acute distress.   HEENT: Normocephalic, atraumatic, pupils are equal, round and reactive to light and accommodation.  corrective eyeglasses in place. Face is symmetric. She has no  hypophonia or dysarthria. She has no lip, neck or jaw tremor. Extra close tracking is good. Hearing is grossly intact. Airway examination reveals mild mouth dryness but otherwise nonfocal findings, tongue protrudes centrally and palate elevates symmetrically. Neck is supple, no carotid bruits.   Chest: Clear to auscultation without wheezing, rhonchi or crackles noted.  Heart: S1+S2+0, regular and normal without murmurs, rubs or gallops noted.   Abdomen: Soft, non-tender and non-distended with normal bowel sounds appreciated on auscultation.  Extremities: There is 3+ pitting edema in the distal lower extremities bilaterally. Chronic stasis-like changes noted, lichenification noted.   Skin: Warm and dry, thickened skin in the distal lower extremities.   Musculoskeletal: exam reveals no obvious joint deformities,  with the exception of mildly arthritic changes in both hands and she reports arthritis pain in both feet.   Neurologically:  Mental status: The patient is awake, alert and oriented in all 4 spheres. Her immediate and remote memory, attention, language skills and fund of knowledge are appropriate. There is no evidence of aphasia, agnosia, apraxia or anomia. Speech is clear with normal prosody and enunciation. Thought process is linear. Mood is normal and affect is normal.  Cranial nerves II - XII are as described above under HEENT exam. In addition: shoulder shrug is normal with equal shoulder height noted. Motor exam: Normal bulk, strength and tone is noted. There is no drift, tremor or rebound. Romberg is  negative. Reflexes 1+ in the upper extremities and absent in the lower extremities, fine motor skills are grossly intact but limited in the lower extremities secondary to body habitus and leg swelling. On Archimedes spiral drawing she has no difficulty with the left or right hand, slight insecurity but no trembling noted. Handwriting is legible, not particularly tremulous, not micrographic. She has a minimal left upper extremity postural tremor only in the hand, it is intermittent, no significant action tremor, no other tremor, no resting tremor, no lower extremity tremor.  Cerebellar testing: No dysmetria or intention tremor on finger to nose testing. Heel to shin is not possible for her. There is no truncal or gait ataxia.  Sensory exam: intact to light touch.   Gait, station and balance: She stands with difficulty, she walks slowly and cautiously, she does not have to use a cane for shorter distances, she brought a single-point cane. Tandem walk is not possible for her.   Assessment and Plan:   In summary, Kambry Takacs is a very pleasant 67 y.o.-year old female with an underlying complex medical history of asthma/COPD, lung cancer status post radiation therapy, arthritis, sarcoidosis, congestive heart failure, congenital single kidney with chronic kidney disease, gout, sleep apnea and morbid obesity with BMI of over 50, who presents for evaluation of her left hand tremor. She reports an intermittent tremor. Past 6 months which was primarily noticed by her family. She is not bothered by it. On examination, she has a minute left hand postural tremor. She does not have any telltale signs or symptoms of parkinsonism or Parkinson's disease, essential tremor is also unlikely, she is advised that tremors can be the result of multiple underlying factors, she is taking prednisone chronically, has reduced at 5 mg 3 times a week from 10 mg daily. Nevertheless, she also takes inhalers and nebulizers which can  exacerbate tremors. She does not have any other focal neurological signs or symptoms which is reassuring. Nevertheless, for completion and her history of lung cancer would like to proceed with a brain MRI. We cannot order an  MRI with contrast because of her impaired kidney function. She is aware of this. I will order a brain MRI without contrast and call her with the results. So long as the findings are age-appropriate, I will see her back on an as-needed basis. We talked about tremor triggers as well today. I answered all her questions today and she was in agreement. Thank you very much for allowing me to participate in the care of this nice patient. If I can be of any further assistance to you please do not hesitate to call me at [phone].  Sincerely,   Star Age, MD, PhD

## 2018-01-26 NOTE — Telephone Encounter (Signed)
Medicare/aarp order sent to GI. No auth they will reach out to the pt to schedule.

## 2018-01-26 NOTE — Patient Instructions (Signed)
You have a rather mild and intermittent tremor of the left hand.  I do not see any signs or symptoms of parkinson's like disease or what we call parkinsonism.   For your tremor, I would not recommend any new medication for fear of side effects (especially sleepiness) or medication interactions, especially in light of your medical history, kidney impairment and multiple other medications, etc. We do not have to make a follow up appointment, as long as your brain MRI is within age-appropriate findings. Unfortunately, I cannot order an MRI with contrast because of your kidney impairment. Nevertheless, I will order a brain MRI without contrast we can call you with results.   Please remember, that any kind of tremor may be exacerbated by anxiety, anger, nervousness, excitement, dehydration, sleep deprivation, by caffeine, and low blood sugar values or blood sugar fluctuations. Some medications can exacerbate tremors, this includes prednisone and asthma medications such as inhalers and nebulizers.

## 2018-01-27 ENCOUNTER — Telehealth: Payer: Self-pay

## 2018-02-18 ENCOUNTER — Ambulatory Visit: Payer: Medicare Other | Admitting: Podiatry

## 2018-02-22 ENCOUNTER — Ambulatory Visit (INDEPENDENT_AMBULATORY_CARE_PROVIDER_SITE_OTHER): Payer: Medicare Other | Admitting: Internal Medicine

## 2018-02-22 ENCOUNTER — Encounter: Payer: Self-pay | Admitting: Internal Medicine

## 2018-02-22 VITALS — BP 126/74 | HR 68 | Ht 66.0 in | Wt 365.0 lb

## 2018-02-22 DIAGNOSIS — J45909 Unspecified asthma, uncomplicated: Secondary | ICD-10-CM

## 2018-02-22 LAB — NITRIC OXIDE: NITRIC OXIDE: 61

## 2018-02-22 NOTE — Assessment & Plan Note (Addendum)
Body mass index is 58.91 kg/m.  -  trending no change Lab Results  Component Value Date   TSH 3.934 05/21/2014     Contributing to gerd risk/ doe with mostly restrictive changes on spirometry /reviewed the need and the process to achieve and maintain neg calorie balance > defer f/u primary care including intermittently monitoring thyroid status

## 2018-02-22 NOTE — Progress Notes (Signed)
Subjective:    Patient ID: Pamela [name], female    DOB: [birth year]  MRN: [id number]   Brief patient profile:  77 yobf never smoker with 1st asthma attack in 1990s and on maint rx since late 90's maint rx and freq courses of prednisone 3-4 x per year despite advair and spiriva and freq saba referred to pulmonary clinic 04/27/2014 by Dr [name] s/p CT Bx 06/25/11 > no path report but per oncology = [id number] adenoca of lung > RT only per pt RUL completed 09/2011     History of Present Illness  04/27/2014 1st [name] office visit/ Pamela [name]   Chief Complaint  Patient presents with  . Pulmonary Consult    Referred by Dr. [name]. [name]  Pt recently moved to [name] from [name] and states needs to establish with new pulmonary MD.  She c/o DOE and cough, and states that she feels these symptoms are currently under control.    on prednisone 10 mg daily  since late July 2015 and still on freq saba and 2lpm  Already used   2puffs proair am of ov  rec Stop spiriva and advair Start dulera 100 Take 2 puffs first thing in am and then another 2 puffs about 12 hours later.  Work on inhaler technique:  relax and gently blow all the way out then take a nice smooth deep breath back in, triggering the inhaler at same time you start breathing in.  Hold for up to 5 seconds if you can.  Rinse and gargle with water when done Only use your albuterol (proair) as a rescue medication  If proair not helping, then use the neb and if needing the neb more than occastional, then take prednisone 10 mg daily  Please schedule a follow up office visit in 4 weeks, sooner if needed with all inhalers in hand for pfts on return   11/30/2014 f/u ov/Gatlin Kittell re: mild intermittent asthma ? Related to weather / still on acei plus on prednisone 10 mg daily for gout  Chief Complaint  Patient presents with  . Acute Visit    Pt c/o increased SOB for the past month. She is  also coughing and wheezing. Cough is prod with minimal yellow to clear sputum. She uses proair 2 x daily on average.   Also due for CT chest to f/u dx of lung ca dx March 2013  never surgery/ RT only  Onset of dry cough / wheeze was insidious/ pattern is progressively worse day >> noct with increasing  need for saba but very poor hfa  rec Stop lisinopril and start valsartan 80 mg one daily instead  Only use your albuterol as a rescue medication   Restart dulera 100 Take 2 puffs first thing in am and then another 2 puffs about 12 hours later until you only need  your rescue inhaler no more than twice weekly for cough/wheeze/ short of breath  Follow up with Dr [name] as planned and only see me if needed    06/06/2015  f/u ov/Pamela [name] re: worse since finished pred ? One week prior to [name]  / was taking for gout  Chief Complaint  Patient presents with  . Acute Visit    Increased SOB with or without exertion for the past 4 days. She also c/o cough and wheezing. Cough is prod with clear sputum.     Was doing much better while on prednisone  and gradually worse since off  Last used proventil about 7 am prior to OV  At 1130  On symbicort but hfa quite poor > see a/p Has neb but no meds for it  rec Prednisone Take 4 for three days 3 for three days 2 for three days 1 for three days and stop Work on inhaler technique:  relax and gently blow all the way out then take a nice smooth deep breath back in, triggering the inhaler at same time you start breathing in.  Hold for up to 5 seconds if you can. Blow out thru nose. Rinse and gargle with water when done Plan A = Automatic = symbicort 80 Take 2 puffs first thing in am and then another 2 puffs about 12 hours later.  Plan B = backup  Only use your albuterol(proair)   Plan C = crisis - only use nebulizer albuterol if you try the proair and it doesn't work       07/02/2015  f/u ov/Pamela [name] re: asthma vs uacs on pred 10 for gout and symb 80 2bid  and no saba    Chief Complaint  Patient presents with  . Follow-up    Breathing is much improved. She has not had to use rescue inhaler or neb.   Work on inhaler technique:  If start needing more albuterol for any reason  in any form return here asap    04/28/2016 NP  ER Follow up : Asthma /Sarcoid /Lung Cancer hx  Pt returns for ER follow up . Seen in ER on 04/08/16 for cough and dyspnea for 2 weeks.  She was treated with nebs and IV steroids. Discharged on prednisone 40mg  daily for 4 d.  BNP and troponin were nml. CXR without acute process.  She remains on Symbicort.. Rate use of albuterol.  Previous PFT 2015 with FEV1 93%, ratio 85, +BD response.  Is working on weight loss. Is down 40lbs since last ov.  Struggles with fluid retention, on Lasix 40mg  Twice daily  .  Has chronic LE swelling. She has CKD followed by [name].  Remains on o2 at 2l/m .  Split night sleep study in 07/2014 showed mild OSA only with AHI 6.9. O2 sats nml on 2l/m .  Over last 2 weeks has had to use albuterol 1 x day.  Since ER she is some better but not back to baseline. Still has intermittent wheezing but decreased .  No chest pain, orthopnea, edema or fever.  Flu shot is utd.  FENO today 04/28/2016  is 70  Moved here in 2015 From [name].  Dx with lung cancer 2012, tx with radiation 2013. At [name].  Now sees Dr. Irene Limbo with Cancer center . Has CT scan due 05/26/16 . No evidence of recurrence based on PET/CT scan done on 05/20/2015. She is a never smoker but has second hand smoke exposure She also carries dx of Sarcoid from previous bx. (followed previously by Dr. Bartholome Bill at [name], with lymphadenopathy in chest , abd w/ possible spleen and liver involvement.  She has right anterior chest wall slowly growing masslike lesion in the right pectoralis minor muscle. Unclear etiology with FNA. Followed on CT chest . She is on prednisone 10mg  daily for gout never less  rec Continue  on Symbicort 2 puffs Twice daily   Add Zyrtec 10mg  At bedtime  As needed  Drainage.  Add Pepcid 20mg  At bedtime  .  Increase prednisone 20mg  daily for  1 week then back to 10mg .     06/01/2016  f/u ov/Pamela [name] re: chronic asthma/sarcoid/ symb 80 2bid/ saba hfa and neb and pred back to 10 mg daily (floor)  Chief Complaint  Patient presents with  . Follow-up    Breathing has improved some since the last visit. She still wheezes occ wheezing. She has occ cough with min yellow sputum.  She is using albuterol inhaler 2 x per wk on average and she uses neb 3 x per wk on average.   Doe x room to room/ sleeping ok on 2lpm with hob elevation 30 degrees rec Plan A = Automatic = increase Symbicort to 160 Take 2 puffs first thing in am and then another 2 puffs about 12 hours later. (ok to use up your symbicort 80 if you run out of the 160 before next visit)  Work on inhaler technique:  relax and gently blow all the way out then take a nice smooth deep breath back in, triggering the inhaler at same time you start breathing in.  Hold for up to 5 seconds if you can. Blow out thru nose. Rinse and gargle with water when done Plan B = Backup Only use your albuterol as a rescue medication  Plan C = Crisis - only use your albuterol nebulizer if you first try Plan B and it fails to help      06/16/16  NP rec Follow med calendar closely and bring to each visit.  Continue on current regimen .     10/15/2016  f/u ov/Pamela [name] re:  Asthma/ no med calendar still maint on symb 160 2bid  Chief Complaint  Patient presents with  . Follow-up    Breathing is overall doing well and she denies any new co's.  She rarely uses albuterol inhaler or neb.   Not limited by breathing from desired activities  But very sedentary rec Continue pred 5 mg daily (for gout as the floor)  Follow med calendar     02/25/2017  f/u ov/Pamela [name] re:  Asthma/ no med calendar/ stopped all 02 x one week prior to [place] Complaint  Patient  presents with  . Pulmonary Consult    Breathing is doing "great". Not having to use albuterol inhaler or neb. She denies any new co's today.    Walking 20-30 min s 02  And says stats staying up  Actually wears the NP even though doesn't turn the 02 on "just in case" she might need it  rec Fine with if you leave off 02 but goal is to keep you over 90% saturated at all times       02/22/2018  f/u ov/Pamela [name] re: asthma / off 02 / symb/flonase  Chief Complaint  Patient presents with  . Follow-up    Breathing has been doing well. She states she has had cough with clear sputum for the past wk. She rarely uses albuterol inhaler or neb.   Dyspnea:  MMRC3 = can't walk 100 yards even at a slow pace at a flat grade s stopping due to sob or back Cough: not much Sleeping: < 30 degrees multiple pillows  SABA use: as above  02: none     No obvious day to day or daytime variability or assoc excess/ purulent sputum or mucus plugs or hemoptysis or cp or chest tightness, subjective wheeze or overt sinus or hb symptoms.   Sleeping as above  without nocturnal  or early am exacerbation  of respiratory  c/o's or need for noct saba. Also denies any obvious fluctuation of symptoms with weather or environmental changes or other aggravating or alleviating factors except as outlined above   No unusual exposure hx or h/o childhood pna/ asthma or knowledge of premature birth.  Current Allergies, Complete Past Medical History, Past Surgical History, Family History, and Social History were reviewed in [employer] record.  ROS  The following are not active complaints unless bolded Hoarseness, sore throat, dysphagia, dental problems, itching, sneezing,  nasal congestion or discharge of excess mucus or purulent secretions, ear ache,   fever, chills, sweats, unintended wt loss or wt gain, classically pleuritic or exertional cp,  orthopnea pnd or arm/hand swelling  or leg swelling, presyncope,  palpitations, abdominal pain, anorexia, nausea, vomiting, diarrhea  or change in bowel habits or change in bladder habits, change in stools or change in urine, dysuria, hematuria,  rash, arthralgias, visual complaints, headache, numbness, weakness or ataxia or problems with walking due to back  or coordination,  change in mood or  memory.        Current Meds  Medication Sig  . acetaminophen (TYLENOL) 500 MG tablet Take 1 tablet (500 mg total) by mouth every 6 (six) hours as needed.  [name] Kitchen albuterol (PROAIR HFA) 108 (90 Base) MCG/ACT inhaler Inhale 2 puffs into the lungs every 6 (six) hours as needed for wheezing or shortness of breath.  [name] Kitchen albuterol (PROVENTIL) (2.5 MG/3ML) 0.083% nebulizer solution Take 3 mLs (2.5 mg total) by nebulization every 6 (six) hours as needed for wheezing or shortness of breath.  [name] Kitchen aspirin 81 MG tablet Take 1 tablet (81 mg total) by mouth daily.  . Calcium Carbonate-Vitamin D (CALTRATE 600+D) 600-400 MG-UNIT tablet Take 1 tablet by mouth daily.  . cetirizine (ZYRTEC) 10 MG tablet Take 1 tablet (10 mg total) by mouth daily.  [name] Kitchen docusate sodium (COLACE) 100 MG capsule Take 1 capsule (100 mg total) by mouth daily.  . febuxostat (ULORIC) 40 MG tablet TAKE 1 TABLET (40 MG TOTAL) BY MOUTH DAILY.  . fluticasone (FLONASE) 50 MCG/ACT nasal spray Place 2 sprays into both nostrils daily.  . furosemide (LASIX) 40 MG tablet Take 1 tablet (40 mg total) by mouth 2 (two) times daily.  [name] Kitchen oxymetazoline (AFRIN) 0.05 % nasal spray Place 1 spray into both nostrils 2 (two) times daily.  . potassium chloride (K-DUR) 10 MEQ tablet TAKE 1 TABLET BY MOUTH ONCE DAILY  . predniSONE (DELTASONE) 10 MG tablet 1/2 tablet (5 mg) every other day  . SYMBICORT 160-4.5 MCG/ACT inhaler INHALE 2 PUFFS BY MOUTH TWICE DAILY  .                         Objective:   Physical Exam  amb massively obese bf nad    11/30/2014  343 > 06/06/2015  355 > 07/03/2015  360 > 06/01/2016  358 > 10/15/2016  366 > 02/25/2017    364 > 02/22/2018  365    Vital signs reviewed - Note on arrival 02 sats  98% on RA         HEENT: nl  turbinates bilaterally, and oropharynx. Nl external ear canals without cough reflex - dentures in place   NECK :  without JVD/Nodes/TM/ nl carotid upstrokes bilaterally   LUNGS: no acc muscle use,  Nl contour chest with trace end exp wheeze bilaterally resolves with plm without cough on insp or exp maneuvers   CV:  RRR  no s3 or murmur or increase in P2, and trace bilateral LE edema  ABD:  Massively obese soft and nontender with limited  inspiratory excursion. . No bruits or organomegaly appreciated, bowel sounds nl  MS:  Nl gait/ ext warm without deformities, calf tenderness, cyanosis or clubbing No obvious joint restrictions   SKIN: warm and dry without lesions    NEURO:  alert, approp, nl sensorium with  no motor or cerebellar deficits apparent.

## 2018-02-22 NOTE — Assessment & Plan Note (Signed)
pfts 07/31/13  FEV1  1.53 (58%) with ratio 66 > p saba ratio 74 FEV1 1.68 (64%)  - -04/27/2014  try dulera 100 2 bid  - PFT's 07/19/2014 FEV1 2.00 (93%) and ratio 85 after 21% improvement from saba with no inhalers x one week  - trial off acei 12/01/2014 due to pseudoasthma component > resolved  - Spirometry 06/01/2016  FEV1 1.06 (48%)  Ratio 61  - FENO 06/01/2016  =   85 on symbicort 80 x2 > increase to 160  - Prednisone 10 mg floor x years, rec increase  to 20 mg if needing neb  - 10/15/2016 reduced pred to 5 mg daily (gout flares with taper)    - Spirometry 02/22/2018  FEV1 1.23 (57%)  Ratio 71 with min curvature  - FENO 02/22/2018  = 61 - 02/22/2018  After extensive coaching inhaler device  effectiveness =    90% from floor of 75%    Feno is bothersome but pt says doing much better and has room to improve her hfa technique so I did not change her rx but  Could add singulair if more symptoms / flares   Note pred rx is for gout per pcp   F/u can be q 54 m, call sooner prn    I had an extended discussion with the patient reviewing all relevant studies completed to date and  lasting 15 to 20 minutes of a 25 minute visit    See device teaching which extended face to face time for this visit.  Each maintenance medication was reviewed in detail including emphasizing most importantly the difference between maintenance and prns and under what circumstances the prns are to be triggered using an action plan format that is not reflected in the computer generated alphabetically organized AVS which I have not found useful in most complex patients, especially with respiratory illnesses  Please see AVS for specific instructions unique to this visit that I personally wrote and verbalized to the the pt in detail and then reviewed with pt  by my nurse highlighting any  changes in therapy recommended at today's visit to their plan of care.

## 2018-02-22 NOTE — Patient Instructions (Addendum)
Work on inhaler technique:  relax and gently blow all the way out then take a nice smooth deep breath back in, triggering the inhaler at same time you start breathing in.  Hold for up to 5 seconds if you can. Blow out thru nose. Rinse and gargle with water when done     Please schedule a follow up visit in 12  months but call sooner if needed  - late add: consider adding singulair if not well controlled

## 2018-02-23 ENCOUNTER — Other Ambulatory Visit: Payer: Self-pay | Admitting: Internal Medicine

## 2018-03-01 ENCOUNTER — Ambulatory Visit
Admission: RE | Admit: 2018-03-01 | Discharge: 2018-03-01 | Disposition: A | Discharge status: Home / Self Care | Payer: Medicare Other | Source: Ambulatory Visit | Attending: Neurology | Admitting: Neurology

## 2018-03-01 DIAGNOSIS — R251 Tremor, unspecified: Secondary | ICD-10-CM

## 2018-03-04 ENCOUNTER — Telehealth: Payer: Self-pay

## 2018-03-04 NOTE — Telephone Encounter (Signed)
-----   Message from Star Age, MD sent at 03/04/2018 11:09 AM EDT ----- Brain MRI wo contrast showed age-appropriate/normal for age findings. Please advise pt.  Michel Bickers

## 2018-03-04 NOTE — Progress Notes (Signed)
Brain MRI wo contrast showed age-appropriate/normal for age findings. Please advise pt.  Pamela [name]

## 2018-03-04 NOTE — Telephone Encounter (Signed)
I called pt to discuss her MRI results. No answer, left a message asking her to call me back. 

## 2018-03-07 NOTE — Telephone Encounter (Signed)
Pt returned my call. I advised her that her MRI brain showed age appropriate findings. Pt will follow up with [place] PRN and follow up with her PCP as scheduled. Pt verbalized understanding of results. Pt had no questions at this time but was encouraged to call back if questions arise.

## 2018-03-11 DIAGNOSIS — H2513 Age-related nuclear cataract, bilateral: Secondary | ICD-10-CM | POA: Diagnosis not present

## 2018-03-11 DIAGNOSIS — H25013 Cortical age-related cataract, bilateral: Secondary | ICD-10-CM | POA: Diagnosis not present

## 2018-03-11 DIAGNOSIS — H35033 Hypertensive retinopathy, bilateral: Secondary | ICD-10-CM | POA: Diagnosis not present

## 2018-03-14 ENCOUNTER — Ambulatory Visit (INDEPENDENT_AMBULATORY_CARE_PROVIDER_SITE_OTHER): Payer: Medicare Other | Admitting: Family Medicine

## 2018-03-14 ENCOUNTER — Encounter: Payer: Self-pay | Admitting: Family Medicine

## 2018-03-14 VITALS — BP 126/76 | HR 79 | Temp 98.5°F | Resp 18 | Ht 66.0 in | Wt 367.0 lb

## 2018-03-14 DIAGNOSIS — E876 Hypokalemia: Secondary | ICD-10-CM | POA: Diagnosis not present

## 2018-03-14 DIAGNOSIS — R05 Cough: Secondary | ICD-10-CM | POA: Diagnosis not present

## 2018-03-14 DIAGNOSIS — M109 Gout, unspecified: Secondary | ICD-10-CM

## 2018-03-14 DIAGNOSIS — I5032 Chronic diastolic (congestive) heart failure: Secondary | ICD-10-CM

## 2018-03-14 DIAGNOSIS — R7303 Prediabetes: Secondary | ICD-10-CM

## 2018-03-14 DIAGNOSIS — J069 Acute upper respiratory infection, unspecified: Secondary | ICD-10-CM | POA: Diagnosis not present

## 2018-03-14 DIAGNOSIS — R058 Other specified cough: Secondary | ICD-10-CM

## 2018-03-14 MED ORDER — BENZONATATE 200 MG PO CAPS: 200.0000 mg | ORAL_CAPSULE | Freq: Three times a day (TID) | ORAL | 0 refills | Status: AC | PRN

## 2018-03-14 MED ORDER — POTASSIUM CHLORIDE ER 10 MEQ PO TBCR: 10.0000 meq | EXTENDED_RELEASE_TABLET | Freq: Every day | ORAL | 1 refills | Status: DC

## 2018-03-14 MED ORDER — IPRATROPIUM BROMIDE 0.02 % IN SOLN
0.5000 mg | Freq: Once | RESPIRATORY_TRACT | Status: AC
Start: 2018-03-14 — End: 2018-03-14
  Administered 2018-03-14: 0.5 mg via RESPIRATORY_TRACT

## 2018-03-14 MED ORDER — AMOXICILLIN-POT CLAVULANATE 875-125 MG PO TABS: 1.0000 | ORAL_TABLET | Freq: Two times a day (BID) | ORAL | 0 refills | Status: AC

## 2018-03-14 MED ORDER — ALBUTEROL SULFATE (2.5 MG/3ML) 0.083% IN NEBU
2.5000 mg | INHALATION_SOLUTION | Freq: Once | RESPIRATORY_TRACT | Status: AC
  Administered 2018-03-14: 2.5 mg via RESPIRATORY_TRACT

## 2018-03-14 MED ORDER — FEBUXOSTAT 40 MG PO TABS: ORAL_TABLET | ORAL | 4 refills | Status: DC

## 2018-03-14 NOTE — Patient Instructions (Addendum)
I sent an antibiotic and cough medications to your pharmacy for your upper respiratory infection.   Acute Bronchitis, Adult Acute bronchitis is when air tubes (bronchi) in the lungs suddenly get swollen. The condition can make it hard to breathe. It can also cause these symptoms:  A cough.  Coughing up clear, yellow, or green mucus.  Wheezing.  Chest congestion.  Shortness of breath.  A fever.  Body aches.  Chills.  A sore throat.  Follow these instructions at home: Medicines  Take over-the-counter and prescription medicines only as told by your doctor.  If you were prescribed an antibiotic medicine, take it as told by your doctor. Do not stop taking the antibiotic even if you start to feel better. General instructions  Rest.  Drink enough fluids to keep your pee (urine) clear or pale yellow.  Avoid smoking and secondhand smoke. If you smoke and you need help quitting, ask your doctor. Quitting will help your lungs heal faster.  Use an inhaler, cool mist vaporizer, or humidifier as told by your doctor.  Keep all follow-up visits as told by your doctor. This is important. How is this prevented? To lower your risk of getting this condition again:  Wash your hands often with soap and water. If you cannot use soap and water, use hand sanitizer.  Avoid contact with people who have cold symptoms.  Try not to touch your hands to your mouth, nose, or eyes.  Make sure to get the flu shot every year.  Contact a doctor if:  Your symptoms do not get better in 2 weeks. Get help right away if:  You cough up blood.  You have chest pain.  You have very bad shortness of breath.  You become dehydrated.  You faint (pass out) or keep feeling like you are going to pass out.  You keep throwing up (vomiting).  You have a very bad headache.  Your fever or chills gets worse. This information is not intended to replace advice given to you by your health care provider.  Make sure you discuss any questions you have with your health care provider. Document Released: 12/23/2007 Document Revised: 02/12/2016 Document Reviewed: 12/25/2015 Elsevier Interactive Patient Education  [employer].

## 2018-03-14 NOTE — Progress Notes (Signed)
   Patient Pamela [name]  Chronic Disease Follow Up Provider: Lanae Boast, FNP  SUBJECTIVE:  Patient presents for follow up for the following  chronic conditions.   has a past medical history of Arthritis, Asthma, Cancer ([place]), CHF (congestive heart failure) ([place]), Congenital single kidney, COPD (chronic obstructive pulmonary disease) ([place]), Gout, Hypertension, Lung cancer ([place]) (2012), Mass of chest wall, right, Oxygen deficiency, Sarcoidosis, Sickle cell trait ([place]), and Sleep apnea.  Patient states that she needs to have a generic uloric written for insurance purposes.   Patient is concerned today about a cough, sob, and chest congestion x 1 week. Patient states that she has been taking otc vitamin C without relief.    Hypertension  Blood pressure is well controlled at home. Patient denies chest pain, palpitations, syncope and tachypnea. Medication compliance: Yes   She  is not exercising and is not adherent to a low-salt, ADA, heart healthy or carbohydrate modified diet.   Review of Systems  Constitutional: Negative.   Respiratory: Positive for cough, sputum production, shortness of breath and wheezing.   Cardiovascular: Negative.   Gastrointestinal: Negative.   Musculoskeletal: Positive for joint pain and myalgias.  Neurological: Negative.     OBJECTIVE:  BP 126/76 (BP Location: Left Wrist, Patient Position: Sitting, Cuff Size: Normal)   Pulse 79   Temp 98.5 F (36.9 C) (Oral)   Resp 18   Ht 5\' 6"  (1.676 m)   Wt (!) 367 lb (166.5 kg)   SpO2 97%   BMI 59.24 kg/m   Physical Exam  Constitutional: She is oriented to person, place, and time and well-developed, well-nourished, and in no distress. No distress.  HENT:  Head: Normocephalic and atraumatic.  Eyes: Pupils are equal, round, and reactive to light. Conjunctivae and EOM are normal.  Neck: Normal range of motion. Neck supple.  Cardiovascular: Normal rate, regular rhythm and  intact distal pulses. Exam reveals no gallop and no friction rub.  No murmur heard. Pulmonary/Chest: Effort normal. No respiratory distress. She has wheezes (throughout all lung fields. ).  Abdominal: Soft. Bowel sounds are normal. There is no tenderness.  Musculoskeletal: Normal range of motion. She exhibits no edema or tenderness.  Ambulating with cane   Lymphadenopathy:    She has no cervical adenopathy.  Neurological: She is alert and oriented to person, place, and time. Gait normal.  Skin: Skin is warm and dry.  Psychiatric: Mood, memory, affect and judgment normal.  Nursing note and vitals reviewed.     ASSESSMENT/PLAN:  1. Gouty arthritis - febuxostat (ULORIC) 40 MG tablet; TAKE 1 TABLET (40 MG TOTAL) BY MOUTH DAILY.  Dispense: 90 tablet; Refill: 4  2. Hypokalemia The current medical regimen is effective;  continue present plan and medications. - potassium chloride (K-DUR) 10 MEQ tablet; Take 1 tablet (10 mEq total) by mouth daily.  Dispense: 90 tablet; Refill: 1  3. Chronic diastolic CHF (congestive heart failure) (HCC) The current medical regimen is effective;  continue present plan and medications. - Comprehensive metabolic panel - CBC with Differential  6. Upper respiratory tract infection, unspecified type - albuterol (PROVENTIL) (2.5 MG/3ML) 0.083% nebulizer solution 2.5 mg - ipratropium (ATROVENT) nebulizer solution 0.5 mg     Return to [name] as scheduled and prn. Patient verbalized understanding and agreed with plan of [name].   Ms. [name], FNP-BC [facility] [address] [place], [place] [address] [phone]

## 2018-03-15 LAB — COMPREHENSIVE METABOLIC PANEL
ALT: 10 IU/L (ref 0–32)
AST: 20 IU/L (ref 0–40)
Albumin/Globulin Ratio: 1.3 (ref 1.2–2.2)
Albumin: 3.9 g/dL (ref 3.6–4.8)
Alkaline Phosphatase: 156 IU/L — ABNORMAL HIGH (ref 39–117)
BUN/Creatinine Ratio: 13 (ref 12–28)
BUN: 21 mg/dL (ref 8–27)
Bilirubin Total: 0.3 mg/dL (ref 0.0–1.2)
CO2: 26 mmol/L (ref 20–29)
Calcium: 9.1 mg/dL (ref 8.7–10.3)
Chloride: 96 mmol/L (ref 96–106)
Creatinine, Ser: 1.67 mg/dL — ABNORMAL HIGH (ref 0.57–1.00)
GFR calc Af Amer: 37 mL/min/{1.73_m2} — ABNORMAL LOW (ref >59)
GFR calc non Af Amer: 32 mL/min/{1.73_m2} — ABNORMAL LOW (ref >59)
Globulin, Total: 3.1 g/dL (ref 1.5–4.5)
Glucose: 101 mg/dL — ABNORMAL HIGH (ref 65–99)
Potassium: 4.1 mmol/L (ref 3.5–5.2)
Sodium: 138 mmol/L (ref 134–144)
Total Protein: 7 g/dL (ref 6.0–8.5)

## 2018-03-15 LAB — CBC WITH DIFFERENTIAL/PLATELET
Basophils Absolute: 0 10*3/uL (ref 0.0–0.2)
Basos: 1 %
EOS (ABSOLUTE): 0.3 10*3/uL (ref 0.0–0.4)
Eos: 5 %
Hematocrit: 43 % (ref 34.0–46.6)
Hemoglobin: 13.2 g/dL (ref 11.1–15.9)
Immature Grans (Abs): 0 10*3/uL (ref 0.0–0.1)
Immature Granulocytes: 0 %
Lymphocytes Absolute: 0.7 10*3/uL (ref 0.7–3.1)
Lymphs: 13 %
MCH: 22 pg — ABNORMAL LOW (ref 26.6–33.0)
MCHC: 30.7 g/dL — ABNORMAL LOW (ref 31.5–35.7)
MCV: 72 fL — ABNORMAL LOW (ref 79–97)
Monocytes Absolute: 0.4 10*3/uL (ref 0.1–0.9)
Monocytes: 7 %
Neutrophils Absolute: 3.9 10*3/uL (ref 1.4–7.0)
Neutrophils: 74 %
Platelets: 190 10*3/uL (ref 150–450)
RBC: 5.99 x10E6/uL — ABNORMAL HIGH (ref 3.77–5.28)
RDW: 14.8 % (ref 12.3–15.4)
WBC: 5.2 10*3/uL (ref 3.4–10.8)

## 2018-03-29 ENCOUNTER — Other Ambulatory Visit: Payer: Self-pay | Admitting: Family Medicine

## 2018-03-29 DIAGNOSIS — Z1231 Encounter for screening mammogram for malignant neoplasm of breast: Secondary | ICD-10-CM

## 2018-05-05 ENCOUNTER — Ambulatory Visit
Admission: RE | Admit: 2018-05-05 | Discharge: 2018-05-05 | Disposition: A | Discharge status: Home / Self Care | Payer: Medicare Other | Source: Ambulatory Visit | Attending: Family Medicine | Admitting: Family Medicine

## 2018-05-05 DIAGNOSIS — Z1231 Encounter for screening mammogram for malignant neoplasm of breast: Secondary | ICD-10-CM

## 2018-05-10 ENCOUNTER — Ambulatory Visit (INDEPENDENT_AMBULATORY_CARE_PROVIDER_SITE_OTHER): Payer: Medicare Other

## 2018-05-10 ENCOUNTER — Encounter: Payer: Self-pay | Admitting: Podiatry

## 2018-05-10 ENCOUNTER — Ambulatory Visit (INDEPENDENT_AMBULATORY_CARE_PROVIDER_SITE_OTHER): Payer: Medicare Other | Admitting: Podiatry

## 2018-05-10 DIAGNOSIS — B351 Tinea unguium: Secondary | ICD-10-CM

## 2018-05-10 DIAGNOSIS — M722 Plantar fascial fibromatosis: Secondary | ICD-10-CM

## 2018-05-10 DIAGNOSIS — M79674 Pain in right toe(s): Secondary | ICD-10-CM | POA: Diagnosis not present

## 2018-05-10 DIAGNOSIS — M79675 Pain in left toe(s): Secondary | ICD-10-CM | POA: Diagnosis not present

## 2018-05-10 MED ORDER — TRIAMCINOLONE ACETONIDE 10 MG/ML IJ SUSP
10.0000 mg | Freq: Once | INTRAMUSCULAR | Status: AC
  Administered 2018-05-10: 10 mg

## 2018-05-10 NOTE — Patient Instructions (Addendum)

## 2018-05-15 NOTE — Progress Notes (Signed)
Subjective: 67 y.o. returns the office today for painful, elongated, thickened toenails which she cannot trim herself. Denies any redness or drainage around the nails.  She also relates that she is having some pain in the bottom of her left foot on the bottom of the heel.  She denies any recent injury or trauma she denies any swelling.  She has had this before.  She states that she gets pain when she first gets up after being on her feet all day.  She has no new concerns today otherwise.  Denies any systemic complaints such as fevers, chills, nausea, vomiting.   Objective: AAO 3, NAD DP 2/4, PT 1/4; chronic ankle swelling present bilaterally no evidence of venous insufficiency but there is no ulcerations or drainage. Nails hypertrophic, dystrophic, elongated, brittle, discolored 10. There is tenderness overlying the nails 1-5 bilaterally. There is no surrounding erythema or drainage along the nail sites. Tenderness to palpation along the plantar medial tubercle of the calcaneus at the insertion of plantar fascia on the left foot. There is no pain along the course of the plantar fascia within the arch of the foot. Plantar fascia appears to be intact. There is no pain with lateral compression of the calcaneus or pain with vibratory sensation. There is no pain along the course or insertion of the achilles tendon. No other areas of tenderness to bilateral lower extremities. No open lesions or pre-ulcerative lesions are identified. No other areas of tenderness identified bilaterally.  No pain to the heel today. No pain with calf compression, warmth, erythema.  Chronic swelling of the bilateral lower extremities.  Assessment: Patient presents with symptomatic onychomycosis; left heel pain, plantar fasciitis  Plan: -Treatment options including alternatives, risks, complications were discussed -Nails sharply debrided 10 without complication/bleeding. -X-rays were pending.  Calcaneal spurring is present.   Arthritic changes present in the midfoot.  No evidence of acute fracture.  In regard to the heel pain discussed stretching, icing daily.  Steroid injection was performed.  See procedure note below.  Continue with supportive shoes. -Discussed daily foot inspection. If there are any changes, to call the office immediately.  -Follow-up in 3 months or sooner if any problems are to arise. In the meantime, encouraged to call the office with any questions, concerns, changes symptoms.  Procedure: Injection Tendon/Ligament Discussed alternatives, risks, complications and verbal consent was obtained.  Location: Left plantar fascia at the glabrous junction; medial approach. Skin Prep: Alcohol. Injectate: 0.5cc 0.5% marcaine plain, 0.5 cc 2% lidocaine plain and, 1 cc kenalog 10. Disposition: Patient tolerated procedure well. Injection site dressed with a band-aid.  Post-injection care was discussed and return precautions discussed.    Celesta Gentile, DPM

## 2018-05-20 ENCOUNTER — Telehealth: Payer: Self-pay | Admitting: *Deleted

## 2018-05-20 NOTE — Telephone Encounter (Signed)
Patient called. Stated she was supposed to have a scan before her next appointment and asked about scheduling scan. Scan is ordered. Gave patient number for central scheduling. Patient verbalized understanding and thanked this Probation officer.

## 2018-05-23 ENCOUNTER — Ambulatory Visit (HOSPITAL_COMMUNITY)
Admission: RE | Admit: 2018-05-23 | Discharge: 2018-05-23 | Disposition: A | Discharge status: Home / Self Care | Payer: Medicare Other | Source: Ambulatory Visit | Attending: Hematology | Admitting: Hematology

## 2018-05-23 DIAGNOSIS — Z85118 Personal history of other malignant neoplasm of bronchus and lung: Secondary | ICD-10-CM | POA: Diagnosis not present

## 2018-05-23 DIAGNOSIS — I700 Atherosclerosis of aorta: Secondary | ICD-10-CM | POA: Insufficient documentation

## 2018-05-23 DIAGNOSIS — C3490 Malignant neoplasm of unspecified part of unspecified bronchus or lung: Secondary | ICD-10-CM | POA: Diagnosis not present

## 2018-05-24 ENCOUNTER — Telehealth: Payer: Self-pay | Admitting: Hematology

## 2018-05-24 NOTE — Telephone Encounter (Signed)
GK out 11/7 - per [place] moved appointments one month out. Spoke with patient re lab/uf 12/5.

## 2018-05-26 ENCOUNTER — Inpatient Hospital Stay: Payer: Medicare Other

## 2018-05-26 ENCOUNTER — Inpatient Hospital Stay: Payer: Medicare Other | Admitting: Hematology

## 2018-06-07 ENCOUNTER — Ambulatory Visit: Payer: Medicare Other | Admitting: Podiatry

## 2018-06-15 ENCOUNTER — Ambulatory Visit (INDEPENDENT_AMBULATORY_CARE_PROVIDER_SITE_OTHER): Payer: Medicare Other | Admitting: Family Medicine

## 2018-06-15 ENCOUNTER — Encounter: Payer: Self-pay | Admitting: Family Medicine

## 2018-06-15 VITALS — BP 130/56 | HR 78 | Temp 98.0°F | Resp 16 | Ht 66.0 in | Wt 367.0 lb

## 2018-06-15 DIAGNOSIS — Z23 Encounter for immunization: Secondary | ICD-10-CM

## 2018-06-15 DIAGNOSIS — Z0000 Encounter for general adult medical examination without abnormal findings: Secondary | ICD-10-CM | POA: Diagnosis not present

## 2018-06-15 NOTE — Patient Instructions (Signed)
  Pamela [name] , Thank you for taking time to come for your Medicare Wellness Visit. I appreciate your ongoing commitment to your health goals. Please review the following plan we discussed and let me know if I can assist you in the future.   These are the goals we discussed: Goals    . LIFESTYLE - DECREASE FALLS RISK       This is a list of the screening recommended for you and due dates:  Health Maintenance  Topic Date Due  . DEXA scan (bone density measurement)  11/06/2017  . Pap Smear  01/01/2018  . Pneumonia vaccines (2 of 2 - PPSV23) 04/14/2019  . Mammogram  05/05/2020  . Tetanus Vaccine  01/01/2025  . Colon Cancer Screening  12/17/2026  .  Hepatitis C: One time screening is recommended by Center for Disease Control  (CDC) for  adults born from [birth year] through 1963.   Completed  . HIV Screening  Completed

## 2018-06-15 NOTE — Progress Notes (Signed)
Subjective:    Pamela [name] is a 67 y.o. female who presents for Medicare Annual/Subsequent preventive examination.   Preventive Screening-Counseling & Management  Tobacco Social History   Tobacco Use  Smoking Status Never Smoker  Smokeless Tobacco Never Used    Current Problems (verified) Patient Active Problem List   Diagnosis Date Noted  . Plantar fasciitis 04/14/2017  . Microcytosis 11/28/2015  . 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 Onychomycosis 01/09/2015  . Ingrown nail 01/09/2015  . Pain in lower limb 01/09/2015  . Chronic respiratory failure with hypoxia and hypercapnia ([place]) 07/19/2014  . CHF (congestive heart failure) ([place]) 06/11/2014  . Anemia, iron deficiency 05/27/2014  . Acute gouty arthritis 05/25/2014  . Acute renal failure superimposed on stage 3 chronic kidney disease ([place]) 05/25/2014  . Obstructive sleep apnea 05/25/2014  . Joint pain 05/24/2014  . Chronic diastolic CHF (congestive heart failure) ([place])   . Sarcoidosis   . Metabolic syndrome 58/59/2924  . Asthma, chronic 04/27/2014  . Anemia of chronic disease 04/19/2014  . Morbid (severe) obesity due to excess calories ([place]) 04/18/2014  . Immunization due 04/18/2014  . Need for prophylactic vaccination and inoculation against influenza 04/18/2014  . Prediabetes 04/13/2014  . Essential hypertension 04/13/2014  . Lower extremity edema 04/13/2014  . History of lung cancer 04/13/2014    Current Medications (verified) Current Outpatient Medications  Medication Sig Dispense Refill  . acetaminophen (TYLENOL) 500 MG tablet Take 1 tablet (500 mg total) by mouth every 6 (six) hours as needed. 30 tablet 2  . albuterol (PROAIR HFA) 108 (90 Base) MCG/ACT inhaler Inhale 2 puffs into the lungs every 6 (six) hours as needed for wheezing or shortness of breath. 1 Inhaler 3  . albuterol (PROVENTIL) (2.5 MG/3ML) 0.083% nebulizer solution Take 3 mLs (2.5 mg total) by nebulization every 6 (six) hours as needed for wheezing or  shortness of breath. 75 mL 12  . aspirin 81 MG tablet Take 1 tablet (81 mg total) by mouth daily. 90 tablet 5  . Calcium Carbonate-Vitamin D (CALTRATE 600+D) 600-400 MG-UNIT tablet Take 1 tablet by mouth daily. 90 tablet 5  . docusate sodium (COLACE) 100 MG capsule Take 1 capsule (100 mg total) by mouth daily. 30 capsule 0  . febuxostat (ULORIC) 40 MG tablet TAKE 1 TABLET (40 MG TOTAL) BY MOUTH DAILY. 90 tablet 4  . fluticasone (FLONASE) 50 MCG/ACT nasal spray Place 2 sprays into both nostrils daily. 16 g 6  . furosemide (LASIX) 40 MG tablet Take 1 tablet (40 mg total) by mouth 2 (two) times daily. 180 tablet 5  . oxymetazoline (AFRIN) 0.05 % nasal spray Place 1 spray into both nostrils 2 (two) times daily.    . potassium chloride (K-DUR) 10 MEQ tablet Take 1 tablet (10 mEq total) by mouth daily. 90 tablet 1  . predniSONE (DELTASONE) 10 MG tablet 1/2 tablet (5 mg) every other day    . SYMBICORT 160-4.5 MCG/ACT inhaler INHALE 2 PUFFS BY MOUTH TWICE DAILY 1 Inhaler 11  . cetirizine (ZYRTEC) 10 MG tablet Take 1 tablet (10 mg total) by mouth daily. (Patient not taking: Reported on 06/15/2018) 30 tablet 11   No current facility-administered medications for this visit.      Allergies (verified) Sulfur   PAST HISTORY  Family History Family History  Problem Relation Age of Onset  . Hypertension Mother   . Renal Disease Mother   . Cancer Father        stomach  . Heart disease Father  No details  . Cancer Brother   . Diabetes Brother   . Cervical cancer Sister   . Diabetes Sister   . Multiple myeloma Sister     Social History Social History   Tobacco Use  . Smoking status: Never Smoker  . Smokeless tobacco: Never Used  Substance Use Topics  . Alcohol use: No    Alcohol/week: 0.0 standard drinks    Are there smokers in your home (other than you)?  Yes  Risk Factors Current exercise habits: The patient does not participate in regular exercise at present.  Dietary issues  discussed: Healthy eating habits   Cardiac risk factors: advanced age (older than 73 for men, 20 for women), dyslipidemia and hypertension.  Depression Screen (Note: if answer to either of the following is "Yes", a more complete depression screening is indicated)   Q1: Over the past two weeks, have you felt down, depressed or hopeless? No  Q2: Over the past two weeks, have you felt little interest or pleasure in doing things? No  Have you lost interest or pleasure in daily life? No  Do you often feel hopeless? No  Do you cry easily over simple problems? No    Activities of Daily Living In your present state of health, do you have any difficulty performing the following activities?:  Driving? No Managing money?  No Feeding yourself? No Getting from bed to chair? No Climbing a flight of stairs? No Preparing food and eating?: no Bathing or showering? No Getting dressed: No Getting to the toilet? No Using the toilet:No Moving around from place to place: No In the past year have you fallen or had a near fall?:No   Are you sexually active?  No  Do you have more than one partner?  No  Hearing Difficulties: No Do you often ask people to speak up or repeat themselves? No Do you experience ringing or noises in your ears? No Do you have difficulty understanding soft or whispered voices? No  Screening Results:    Hearing Screening (Inadequate exam)  Edited by: Batten, Sherlon Handing, LPN   <[id number]>_3<\/[id number]>_6  _1  _2  _3  _4  _5  _6  _7  _8   Right ear  _9 Left ear           Method: Audiometry  Vision Screening  Edited by: Batten, Sherlon Handing, LPN   Right eye Left eye Both eyes  With correction _10    Vision Screening on 01/02/2015  Edited by: Batten, Sherlon Handing, LPN   Right eye Left eye Both eyes  With correction _11        Do you feel that you have a problem with memory? No  Do you often misplace items? No  Do you feel safe at home?   Yes  Cognitive Testing  Alert? Yes  Normal Appearance?Yes  Oriented to person? Yes  Place? Yes   Time? Yes  Recall of three objects?  Yes  Can perform simple calculations? Yes  Displays appropriate judgment?Yes  Can read the correct time from a watch face?Yes   Advanced Directives have been discussed with the patient? Yes   List the Names of Other Physician/Practitioners you currently use: 1.    Indicate any recent Medical Services you may have received from other than [facility] providers in the past year (date may be approximate).  Immunization History  Administered Date(s) Administered  . Influenza,inj,Quad PF,6+ Mos 04/13/2014, 04/16/2015, 04/03/2016, 05/19/2017  . Pneumococcal Conjugate-13  04/03/2016  . Pneumococcal Polysaccharide-23 04/13/2014  . Tdap 01/02/2015  . Zoster 05/21/2014    Screening Tests Health Maintenance  Topic Date Due  . DEXA SCAN  11/06/2017  . PAP SMEAR  01/01/2018  . PNA vac Low Risk Adult (2 of 2 - PPSV23) 04/14/2019  . MAMMOGRAM  05/05/2020  . TETANUS/TDAP  01/01/2025  . COLONOSCOPY  12/17/2026  . Hepatitis C Screening  Completed  . HIV Screening  Completed    All answers were reviewed with the patient and necessary referrals were made:  History reviewed: allergies, current medications, past family history, past medical history, past social history, past surgical history and problem list  Review of Systems ROS    Objective:     Physical Exam  Body mass index is 59.24 kg/m.      Assessment:     There are no diagnoses linked to this encounter.      Plan:     During the course of the visit the patient was educated and counseled about appropriate screening and preventive services including:    Pneumococcal vaccine   Influenza vaccine  Hepatitis B vaccine  Td vaccine  Screening electrocardiogram  Colorectal cancer screening  Diabetes screening  Nutrition counseling   Smoking cessation counseling  Diet review for  nutrition referral? Yes ____  Not Indicated __x__   Patient Instructions (the written plan) was given to the patient.  Medicare Attestation I have personally reviewed: The patient's medical and social history Their use of alcohol, tobacco or illicit drugs Their current medications and supplements The patient's functional ability including ADLs,fall risks, home safety risks, cognitive, and hearing and visual impairment Diet and physical activities Evidence for depression or mood disorders  The patient's weight, height, BMI, and visual acuity have been recorded in the chart.  I have made referrals, counseling, and provided education to the patient based on review of the above and I have provided the patient with a written personalized care plan for preventive services.    Ms. [name], FNP-BC [facility] [address] [place], [place] [address] [phone]  06/15/2018

## 2018-06-23 ENCOUNTER — Inpatient Hospital Stay (HOSPITAL_BASED_OUTPATIENT_CLINIC_OR_DEPARTMENT_OTHER): Payer: Medicare Other | Admitting: Hematology

## 2018-06-23 ENCOUNTER — Inpatient Hospital Stay: Payer: Medicare Other | Attending: Hematology

## 2018-06-23 VITALS — BP 149/65 | HR 77 | Temp 97.9°F | Resp 22 | Ht 66.0 in | Wt 366.8 lb

## 2018-06-23 DIAGNOSIS — Z7952 Long term (current) use of systemic steroids: Secondary | ICD-10-CM

## 2018-06-23 DIAGNOSIS — I700 Atherosclerosis of aorta: Secondary | ICD-10-CM | POA: Diagnosis not present

## 2018-06-23 DIAGNOSIS — Z923 Personal history of irradiation: Secondary | ICD-10-CM | POA: Diagnosis not present

## 2018-06-23 DIAGNOSIS — Z85118 Personal history of other malignant neoplasm of bronchus and lung: Secondary | ICD-10-CM | POA: Diagnosis not present

## 2018-06-23 DIAGNOSIS — Z79899 Other long term (current) drug therapy: Secondary | ICD-10-CM | POA: Insufficient documentation

## 2018-06-23 DIAGNOSIS — Z7982 Long term (current) use of aspirin: Secondary | ICD-10-CM

## 2018-06-23 DIAGNOSIS — N189 Chronic kidney disease, unspecified: Secondary | ICD-10-CM | POA: Insufficient documentation

## 2018-06-23 DIAGNOSIS — I509 Heart failure, unspecified: Secondary | ICD-10-CM | POA: Insufficient documentation

## 2018-06-23 DIAGNOSIS — C3490 Malignant neoplasm of unspecified part of unspecified bronchus or lung: Secondary | ICD-10-CM

## 2018-06-23 DIAGNOSIS — J449 Chronic obstructive pulmonary disease, unspecified: Secondary | ICD-10-CM | POA: Diagnosis not present

## 2018-06-23 DIAGNOSIS — I130 Hypertensive heart and chronic kidney disease with heart failure and stage 1 through stage 4 chronic kidney disease, or unspecified chronic kidney disease: Secondary | ICD-10-CM

## 2018-06-23 DIAGNOSIS — D573 Sickle-cell trait: Secondary | ICD-10-CM

## 2018-06-23 DIAGNOSIS — D869 Sarcoidosis, unspecified: Secondary | ICD-10-CM | POA: Insufficient documentation

## 2018-06-23 DIAGNOSIS — G4730 Sleep apnea, unspecified: Secondary | ICD-10-CM | POA: Insufficient documentation

## 2018-06-23 DIAGNOSIS — M1990 Unspecified osteoarthritis, unspecified site: Secondary | ICD-10-CM | POA: Insufficient documentation

## 2018-06-23 LAB — CBC WITH DIFFERENTIAL/PLATELET
ABS IMMATURE GRANULOCYTES: 0.03 10*3/uL (ref 0.00–0.07)
BASOS ABS: 0 10*3/uL (ref 0.0–0.1)
BASOS PCT: 1 %
EOS ABS: 0.8 10*3/uL — AB (ref 0.0–0.5)
Eosinophils Relative: 14 %
HCT: 36.9 % (ref 36.0–46.0)
Hemoglobin: 11.4 g/dL — ABNORMAL LOW (ref 12.0–15.0)
IMMATURE GRANULOCYTES: 1 %
Lymphocytes Relative: 21 %
Lymphs Abs: 1.1 10*3/uL (ref 0.7–4.0)
MCH: 22.1 pg — ABNORMAL LOW (ref 26.0–34.0)
MCHC: 30.9 g/dL (ref 30.0–36.0)
MCV: 71.7 fL — AB (ref 80.0–100.0)
MONOS PCT: 8 %
Monocytes Absolute: 0.5 10*3/uL (ref 0.1–1.0)
NEUTROS ABS: 3 10*3/uL (ref 1.7–7.7)
NEUTROS PCT: 55 %
NRBC: 0 % (ref 0.0–0.2)
PLATELETS: 180 10*3/uL (ref 150–400)
RBC: 5.15 MIL/uL — AB (ref 3.87–5.11)
RDW: 14.7 % (ref 11.5–15.5)
WBC: 5.4 10*3/uL (ref 4.0–10.5)

## 2018-06-23 LAB — CMP (CANCER CENTER ONLY)
ALBUMIN: 3.2 g/dL — AB (ref 3.5–5.0)
ALK PHOS: 150 U/L — AB (ref 38–126)
ALT: 16 U/L (ref 0–44)
ANION GAP: 9 (ref 5–15)
AST: 19 U/L (ref 15–41)
BILIRUBIN TOTAL: 0.5 mg/dL (ref 0.3–1.2)
BUN: 25 mg/dL — ABNORMAL HIGH (ref 8–23)
CALCIUM: 8.8 mg/dL — AB (ref 8.9–10.3)
CO2: 31 mmol/L (ref 22–32)
Chloride: 103 mmol/L (ref 98–111)
Creatinine: 1.66 mg/dL — ABNORMAL HIGH (ref 0.44–1.00)
GFR, EST AFRICAN AMERICAN: 37 mL/min — AB (ref >60)
GFR, Estimated: 32 mL/min — ABNORMAL LOW (ref >60)
GLUCOSE: 103 mg/dL — AB (ref 70–99)
POTASSIUM: 4.2 mmol/L (ref 3.5–5.1)
Sodium: 143 mmol/L (ref 135–145)
TOTAL PROTEIN: 6.8 g/dL (ref 6.5–8.1)

## 2018-06-23 NOTE — Progress Notes (Signed)
[name] Kitchen    HEMATOLOGY/ONCOLOGY CLINIC NOTE  Date of Service: 12/09/17   Patient Care Team: Lanae Boast, FNP as PCP - General (Family Medicine)  CHIEF COMPLAINTS/PURPOSE OF CONSULTATION:  Followup of lung cancer  DIAGNOSIS 1. Right upper lobe T1b, N0, M0 primary lung adenocarcinoma diagnosed in December 2012. She was deemed not to be a surgical candidate. Treated by radiation oncology with SBRT at [facility]. Completed treatment in March 2013 and has been monitored since with no evidence of recurrence based on PET/CT scan done on 05/20/2015.  2.  Right anterior chest wall slowly growing masslike lesion in the right pectoralis minor muscle. Unclear etiology. Biopsy was done on 12/14/2013 at the [facility] [name]. FNA showed skeletal muscles, macrophages and spindle cells some with cytologic atypia. Needle core biopsy showed soft tissue and skeletal muscle with extensive necrosis; rare spindle cells with cytologic atypia. Treatment effect causing reactive atypia cannot be ruled out. Clinical correlation was advised to ensure the sample is representative. PET/CT scan done here on 05/20/2015 shows no change in metabolic activity or size of the medial right chest wall mass suggesting benign etiology.  INTERVAL HISTORY  Pamela [name] is here for her scheduled 6 month follow-up. The patient's last visit with [place] was on 12/09/17. The pt reports that she is doing well overall.   The pt reports that she has enjoyed ease of breathing, and that her breathing has been stable. She notes that she continues follow up with her pulmonologist Dr. Melvyn Novas. The pt is taking 33m Prednisone every other day for her sarcoidosis. She has not required oxygen at all since our last visit.   The pt notes that she is eating well and her weight has been stable. She denies any abdominal pains. She notes that she her right ankle is slightly more swollen than her left which has not changed and is not painful.   Of note  since the patient's last visit, pt has had a CT Chest completed on 05/23/18 with results revealing No evidence of recurrent or metastatic disease. 2.  Aortic atherosclerosis.  Lab results today (06/23/18) of CBC w/diff and CMP is as follows: all values are WNL except for RBC at 5.15, HGB at 11.4, MCV at 71.7, MCH at 22.1, Eosinophils abs at 800, Glucose at 103, BUN at 25, Creatinine at 1.66, Calcium at 8.8, Albumin at 3.2, Alk Phos at 150, GFR at 37.  On review of systems, pt reports stable breathing, stable weight, good energy levels, stable right ankle swelling, and denies abdominal pains, SOB, difficulty breathing, fevers, chills, night sweats, and any other symptoms.   MEDICAL HISTORY:  Past Medical History:  Diagnosis Date  . Arthritis   . Asthma   . [place]    [place], adenocarcinoma right lung 2012  . CHF (congestive heart failure) (HCC)    Preserved EF  . Congenital single kidney    With chronic kidney disease  . COPD (chronic obstructive pulmonary disease) ([facility]   . Gout   . Hypertension   . Lung cancer ([facility] 2012   Right upper lobe lung adenocarcinoma diagnosed with needle biopsy treated by SBRT finished treatment April 2013 has been monitored since  . Mass of chest wall, right    Right chest wall mass 7.3 cm biopsy on 12/13/2013. Patient notes it was consistent with sarcoidosis but actual pathology results not available.  . Oxygen deficiency   . Sarcoidosis   . Sickle cell trait ([place]   . Sleep apnea  CKD ([place] 11/21/2015 showed no urinary tract abnormalities.) Gout on Uloric - tea as a trigger.  SURGICAL HISTORY: Past Surgical History:  Procedure Laterality Date  . LUNG BIOPSY    . TUBAL LIGATION    . VEIN LIGATION AND STRIPPING      SOCIAL HISTORY: Social History   Socioeconomic History  . Marital status: Divorced    Spouse name: Not on file  . Number of children: 2  . Years of education: Not on file  . Highest education level: Not on file  Occupational  History  . Not on file  Social Needs  . Financial resource strain: Not on file  . Food insecurity:    Worry: Not on file    Inability: Not on file  . Transportation needs:    Medical: Not on file    Non-medical: Not on file  Tobacco Use  . Smoking status: Never Smoker  . Smokeless tobacco: Never Used  Substance and Sexual Activity  . Alcohol use: No    Alcohol/week: 0.0 standard drinks  . Drug use: No  . Sexual activity: Not on file  Lifestyle  . Physical activity:    Days per week: Not on file    Minutes per session: Not on file  . Stress: Not on file  Relationships  . Social connections:    Talks on phone: Not on file    Gets together: Not on file    Attends religious service: Not on file    Active member of club or organization: Not on file    Attends meetings of clubs or organizations: Not on file    Relationship status: Not on file  . Intimate partner violence:    Fear of current or ex partner: Not on file    Emotionally abused: Not on file    Physically abused: Not on file    Forced sexual activity: Not on file  Other Topics Concern  . Not on file  Social History Narrative   Lives with son.  One son is deceased.      FAMILY HISTORY: Family History  Problem Relation Age of Onset  . Hypertension Mother   . Renal Disease Mother   . Cancer Father        stomach  . Heart disease Father        No details  . Cancer Brother   . Diabetes Brother   . Cervical cancer Sister   . Diabetes Sister   . Multiple myeloma Sister     ALLERGIES:  is allergic to sulfur.  MEDICATIONS:  Current Outpatient Medications  Medication Sig Dispense Refill  . acetaminophen (TYLENOL) 500 MG tablet Take 1 tablet (500 mg total) by mouth every 6 (six) hours as needed. 30 tablet 2  . albuterol (PROAIR HFA) 108 (90 Base) MCG/ACT inhaler Inhale 2 puffs into the lungs every 6 (six) hours as needed for wheezing or shortness of breath. 1 Inhaler 3  . albuterol (PROVENTIL) (2.5 MG/3ML)  0.083% nebulizer solution Take 3 mLs (2.5 mg total) by nebulization every 6 (six) hours as needed for wheezing or shortness of breath. 75 mL 12  . aspirin 81 MG tablet Take 1 tablet (81 mg total) by mouth daily. 90 tablet 5  . Calcium Carbonate-Vitamin D (CALTRATE 600+D) 600-400 MG-UNIT tablet Take 1 tablet by mouth daily. 90 tablet 5  . cetirizine (ZYRTEC) 10 MG tablet Take 1 tablet (10 mg total) by mouth daily. (Patient not taking: Reported on 06/15/2018) 30 tablet 11  .  docusate sodium (COLACE) 100 MG capsule Take 1 capsule (100 mg total) by mouth daily. 30 capsule 0  . febuxostat (ULORIC) 40 MG tablet TAKE 1 TABLET (40 MG TOTAL) BY MOUTH DAILY. 90 tablet 4  . fluticasone (FLONASE) 50 MCG/ACT nasal spray Place 2 sprays into both nostrils daily. 16 g 6  . furosemide (LASIX) 40 MG tablet Take 1 tablet (40 mg total) by mouth 2 (two) times daily. 180 tablet 5  . oxymetazoline (AFRIN) 0.05 % nasal spray Place 1 spray into both nostrils 2 (two) times daily.    . potassium chloride (K-DUR) 10 MEQ tablet Take 1 tablet (10 mEq total) by mouth daily. 90 tablet 1  . predniSONE (DELTASONE) 10 MG tablet 1/2 tablet (5 mg) every other day    . SYMBICORT 160-4.5 MCG/ACT inhaler INHALE 2 PUFFS BY MOUTH TWICE DAILY 1 Inhaler 11   No current facility-administered medications for this visit.     REVIEW OF SYSTEMS:    A 10+ POINT REVIEW OF SYSTEMS WAS OBTAINED including neurology, dermatology, psychiatry, cardiac, respiratory, lymph, extremities, GI, GU, Musculoskeletal, constitutional, breasts, reproductive, HEENT.  All pertinent positives are noted in the HPI.  All others are negative.   PHYSICAL EXAMINATION:  ECOG PERFORMANCE STATUS: 2-3  . Vitals:   06/23/18 1420  BP: (!) 149/65  Pulse: 77  Resp: (!) 22  Temp: 97.9 F (36.6 C)  SpO2: 94%   Filed Weights   06/23/18 1420  Weight: (!) 366 lb 12.8 oz (166.4 kg)   .Body mass index is 59.2 kg/m.   . Wt Readings from Last 3 Encounters:  06/23/18  (!) 366 lb 12.8 oz (166.4 kg)  06/15/18 (!) 367 lb (166.5 kg)  03/14/18 (!) 367 lb (166.5 kg)    GENERAL:alert, in no acute distress and comfortable SKIN: no acute rashes, no significant lesions EYES: conjunctiva are pink and non-injected, sclera anicteric OROPHARYNX: MMM, no exudates, no oropharyngeal erythema or ulceration NECK: supple, no JVD LYMPH:  no palpable lymphadenopathy in the cervical, axillary or inguinal regions LUNGS: clear to auscultation b/l with normal respiratory effort HEART: regular rate & rhythm ABDOMEN:  normoactive bowel sounds , non tender, not distended. No palpable hepatosplenomegaly.  Extremity: 2+ right pedal edema, 1+ left pedal edema  PSYCH: alert & oriented x 3 with fluent speech NEURO: no focal motor/sensory deficits    LABORATORY DATA:  I have reviewed the data as listed  . CBC Latest Ref Rng & Units 06/23/2018 03/14/2018 12/09/2017  WBC 4.0 - 10.5 K/uL 5.4 5.2 6.0  Hemoglobin 12.0 - 15.0 g/dL 11.4(L) 13.2 11.7  Hematocrit 36.0 - 46.0 % 36.9 43.0 38.2  Platelets 150 - 400 K/uL 180 190 174   . CBC    Component Value Date/Time   WBC 5.4 06/23/2018 1300   RBC 5.15 (H) 06/23/2018 1300   HGB 11.4 (L) 06/23/2018 1300   HGB 13.2 03/14/2018 1207   HGB 11.8 05/31/2017 1050   HCT 36.9 06/23/2018 1300   HCT 43.0 03/14/2018 1207   HCT 37.5 05/31/2017 1050   PLT 180 06/23/2018 1300   PLT 190 03/14/2018 1207   MCV 71.7 (L) 06/23/2018 1300   MCV 72 (L) 03/14/2018 1207   MCV 71.2 (L) 05/31/2017 1050   MCH 22.1 (L) 06/23/2018 1300   MCHC 30.9 06/23/2018 1300   RDW 14.7 06/23/2018 1300   RDW 14.8 03/14/2018 1207   RDW 15.1 (H) 05/31/2017 1050   LYMPHSABS 1.1 06/23/2018 1300   LYMPHSABS 0.7 03/14/2018 1207  LYMPHSABS 1.2 05/31/2017 1050   MONOABS 0.5 06/23/2018 1300   MONOABS 0.4 05/31/2017 1050   EOSABS 0.8 (H) 06/23/2018 1300   EOSABS 0.3 03/14/2018 1207   BASOSABS 0.0 06/23/2018 1300   BASOSABS 0.0 03/14/2018 1207   BASOSABS 0.0 05/31/2017  1050     . CMP Latest Ref Rng & Units 06/23/2018 03/14/2018 12/09/2017  Glucose 70 - 99 mg/dL 103(H) 101(H) 76  BUN 8 - 23 mg/dL 25(H) 21 32(H)  Creatinine 0.44 - 1.00 mg/dL 1.66(H) 1.67(H) 1.61(H)  Sodium 135 - 145 mmol/L 143 138 141  Potassium 3.5 - 5.1 mmol/L 4.2 4.1 4.0  Chloride 98 - 111 mmol/L 103 96 103  CO2 22 - 32 mmol/L 31 26 31(H)  Calcium 8.9 - 10.3 mg/dL 8.8(L) 9.1 9.0  Total Protein 6.5 - 8.1 g/dL 6.8 7.0 7.1  Total Bilirubin 0.3 - 1.2 mg/dL 0.5 0.3 0.3  Alkaline Phos 38 - 126 U/L 150(H) 156(H) 145  AST 15 - 41 U/L _0 ALT 0 - 44 U/L _1 RADIOGRAPHIC STUDIES: I have personally reviewed the radiological images as listed and agreed with the findings in the report. No results found.   ASSESSMENT & PLAN:   67 y.o. African-American female with   #1 History of right upper lobe T1b, N0, M0 primary lung adenocarcinoma most in December 2012. She was deemed not to be a surgical candidate. Treated by radiation oncology with SBRT at [facility]. Completed treatment in March 2013 and has been monitored since with no evidence of recurrence. no evidence of recurrence based on PET/CT scan done on 05/20/2015. On clinical visit today the patient has no new change in her breathing and no new focal symptoms.  Hemoglobin is stable.   CT chest 05/26/2016 shows no evidence of local recurrence of cancer or metastatic disease. CT Chest, 05/31/2017 shows no evidence of local recurrence of cancer or metastatic disease.   #2 Right anterior chest wall slowly growing masslike lesion in the right pectoralis minor muscle. Unclear etiology. FNA showed skeletal muscles, macrophages and spindle cells some with cytologic atypia. Needle core biopsy showed soft tissue and skeletal muscle with extensive necrosis; rare spindle cells with cytologic atypia. Treatment effect causing reactive atypia cannot be ruled out.  PET/CT scan done here on 05/20/2015 shows no change in metabolic  activity or size of the medial right chest wall mass suggesting benign etiology. Could certainly be related to her sarcoidosis . Previously discussed with the patient the option of biopsying the mass for a more definitive diagnosis versus monitoring it. She chooses to monitor it at this time given that imaging findings suggest a likely benign etiology that hasnt changed over the last 18 months. CT chest 05/26/2016 shows that this lesion is stable compared to previous imaging. -CT Chest, 05/31/2017 shows that this lesion is stable. No locally recurrent or metastatic disease.   PLAN:  -Discussed pt labwork today, 06/23/18; HGB slightly decreased to 11.4, other blood counts and chemistries are stable  -Discussed the 05/23/18 CT Chest which revealed No evidence of recurrent or metastatic disease. 2.  Aortic atherosclerosis. -Since the pt has not shown any radiographic, clinical, or lab evidence of returned or progressive lung cancer after 5 years from completing her treatment, we will begin once a year follow ups -Will see the pt back in one year with repeat CT Chest, sooner if any new concerns   #3 history of extensive sarcoidosis involving lymphadenopathy in the chest,  abdomen as well as spleen and possibly liver .previously treated with steroids by Dr. Bartholome Bill, pulmonary at [facility] .has recently established care with Dr. Melvyn Novas for her pulmonary and sarcoidosis management.   Plan -Continue followup with Dr. Melvyn Novas For further management   #4 history of sickle cell trait. Hemoglobin electrophoresis is not available. Hgb at 11.4 today.  Microcytosis likely from chronic disease from her sarcoidosis vs possible alpha thal trait. Ferritin levels WNL  Plan   -No indication for additional iron replacement at this time.   RTC with Dr Irene Limbo in 12 months CT Chest prior to visit    All of the patients questions were answered  to her apparent satisfaction. The patient knows to call the clinic with  any problems, questions or concerns.  The total time spent in the appt was 20 minutes and more than 50% was on counseling and direct patient cares.     Sullivan Lone MD [place] AAHIVMS [facility] Hematology/Oncology Physician [facility]  (Office):       [phone] (Work cell):  [phone] (Fax):           [phone]  I, Baldwin [place], am acting as a scribe for Dr. Sullivan Lone.   .I have reviewed the above documentation for accuracy and completeness, and I agree with the above. Brunetta Genera MD

## 2018-06-24 ENCOUNTER — Telehealth: Payer: Self-pay

## 2018-06-24 NOTE — Telephone Encounter (Signed)
Per 12/5 los scheduled appointment in 12 months. And mailed a letter with a calender enclosed with CT information also

## 2018-06-28 DIAGNOSIS — E8881 Metabolic syndrome: Secondary | ICD-10-CM | POA: Diagnosis not present

## 2018-06-28 DIAGNOSIS — N2581 Secondary hyperparathyroidism of renal origin: Secondary | ICD-10-CM | POA: Diagnosis not present

## 2018-06-28 DIAGNOSIS — I129 Hypertensive chronic kidney disease with stage 1 through stage 4 chronic kidney disease, or unspecified chronic kidney disease: Secondary | ICD-10-CM | POA: Diagnosis not present

## 2018-06-28 DIAGNOSIS — J449 Chronic obstructive pulmonary disease, unspecified: Secondary | ICD-10-CM | POA: Diagnosis not present

## 2018-06-28 DIAGNOSIS — D631 Anemia in chronic kidney disease: Secondary | ICD-10-CM | POA: Diagnosis not present

## 2018-06-28 DIAGNOSIS — I509 Heart failure, unspecified: Secondary | ICD-10-CM | POA: Diagnosis not present

## 2018-06-28 DIAGNOSIS — C3490 Malignant neoplasm of unspecified part of unspecified bronchus or lung: Secondary | ICD-10-CM | POA: Diagnosis not present

## 2018-06-28 DIAGNOSIS — N189 Chronic kidney disease, unspecified: Secondary | ICD-10-CM | POA: Diagnosis not present

## 2018-06-28 DIAGNOSIS — D869 Sarcoidosis, unspecified: Secondary | ICD-10-CM | POA: Diagnosis not present

## 2018-06-28 DIAGNOSIS — J45909 Unspecified asthma, uncomplicated: Secondary | ICD-10-CM | POA: Diagnosis not present

## 2018-06-28 DIAGNOSIS — M109 Gout, unspecified: Secondary | ICD-10-CM | POA: Diagnosis not present

## 2018-07-08 DIAGNOSIS — I129 Hypertensive chronic kidney disease with stage 1 through stage 4 chronic kidney disease, or unspecified chronic kidney disease: Secondary | ICD-10-CM | POA: Diagnosis not present

## 2018-08-02 ENCOUNTER — Ambulatory Visit (INDEPENDENT_AMBULATORY_CARE_PROVIDER_SITE_OTHER): Payer: Medicare Other | Admitting: Internal Medicine

## 2018-08-02 ENCOUNTER — Encounter: Payer: Self-pay | Admitting: Internal Medicine

## 2018-08-02 VITALS — BP 110/76 | HR 67 | Temp 98.2°F | Ht 66.0 in | Wt 360.0 lb

## 2018-08-02 DIAGNOSIS — J4531 Mild persistent asthma with (acute) exacerbation: Secondary | ICD-10-CM

## 2018-08-02 DIAGNOSIS — J441 Chronic obstructive pulmonary disease with (acute) exacerbation: Secondary | ICD-10-CM

## 2018-08-02 DIAGNOSIS — J45901 Unspecified asthma with (acute) exacerbation: Secondary | ICD-10-CM

## 2018-08-02 MED ORDER — AZITHROMYCIN 250 MG PO TABS: ORAL_TABLET | ORAL | 0 refills | Status: DC

## 2018-08-02 MED ORDER — PREDNISONE 10 MG PO TABS
ORAL_TABLET | ORAL | 0 refills | Status: DC
Start: 2018-08-02 — End: 2018-08-30

## 2018-08-02 NOTE — Patient Instructions (Addendum)
zpak   Prednisone 10 mg take  4 each am x 2 days,   2 each am x 2 days,  1 each am x 2 days and stop    Please schedule a follow up office visit in 4 weeks, sooner if needed pfts on return - add also needs feno on return

## 2018-08-02 NOTE — Progress Notes (Signed)
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     History of Present Illness  04/27/2014 1st [facility] office visit/ Pamela [name]   Chief Complaint  Patient presents with  . Pulmonary Consult    Referred by Dr. [name]. [name]  Pt recently moved to [place] from [name] and states needs to establish with new pulmonary MD.  She c/o DOE and cough, and states that she feels these symptoms are currently under control.    on prednisone 10 mg daily  since late July 2015 and still on freq saba and 2lpm  Already used   2puffs proair am of ov  rec Stop spiriva and advair Start dulera 100 Take 2 puffs first thing in am and then another 2 puffs about 12 hours later.  Work on inhaler technique:  relax and gently blow all the way out then take a nice smooth deep breath back in, triggering the inhaler at same time you start breathing in.  Hold for up to 5 seconds if you can.  Rinse and gargle with water when done Only use your albuterol (proair) as a rescue medication  If proair not helping, then use the neb and if needing the neb more than occastional, then take prednisone 10 mg daily  Please schedule a follow up office visit in 4 weeks, sooner if needed with all inhalers in hand for pfts on return   11/30/2014 f/u ov/Pamela [name] re: mild intermittent asthma ? Related to weather / still on acei plus on prednisone 10 mg daily for gout  Chief Complaint  Patient presents with  . Acute Visit    Pt c/o increased SOB for the past month.  She is also coughing and wheezing. Cough is prod with minimal yellow to clear sputum. She uses proair 2 x daily on average.   Also due for CT chest to f/u dx of lung ca dx March 2013  never surgery/ RT only  Onset of dry cough / wheeze was insidious/ pattern is progressively worse day >> noct with increasing  need for saba but very poor hfa  rec Stop lisinopril and start valsartan 80 mg one daily instead  Only use your albuterol as a rescue medication   Restart dulera 100 Take 2 puffs first thing in am and then another 2 puffs about 12 hours later until you only need  your rescue inhaler no more than twice weekly for cough/wheeze/ short of breath  Follow up with Dr [name] as planned and only see me if needed    06/06/2015  f/u ov/Pamela [name] re: worse since finished pred ? One week prior to [place]  / was taking for gout  Chief Complaint  Patient presents with  . Acute Visit    Increased SOB with or without exertion for the past 4 days. She also c/o cough and wheezing. Cough is prod with clear sputum.     Was doing much better  while on prednisone and gradually worse since off  Last used proventil about 7 am prior to OV  At 1130  On symbicort but hfa quite poor > see a/p Has neb but no meds for it  rec Prednisone Take 4 for three days 3 for three days 2 for three days 1 for three days and stop Work on inhaler technique:  relax and gently blow all the way out then take a nice smooth deep breath back in, triggering the inhaler at same time you start breathing in.  Hold for up to 5 seconds if you can. Blow out thru nose. Rinse and gargle with water when done Plan A = Automatic = symbicort 80 Take 2 puffs first thing in am and then another 2 puffs about 12 hours later.  Plan B = backup  Only use your albuterol(proair)   Plan C = crisis - only use nebulizer albuterol if you try the proair and it doesn't work       07/02/2015  f/u ov/Pamela [name] re: asthma vs uacs on pred 10 for gout and symb 80 2bid  and no  saba  Chief Complaint  Patient presents with  . Follow-up    Breathing is much improved. She has not had to use rescue inhaler or neb.   Work on inhaler technique:  If start needing more albuterol for any reason  in any form return here asap    04/28/2016 NP  ER Follow up : Asthma /Sarcoid /Lung Cancer hx  Pt returns for ER follow up . Seen in ER on 04/08/16 for cough and dyspnea for 2 weeks.  She was treated with nebs and IV steroids. Discharged on prednisone 40mg  daily for 4 d.  BNP and troponin were nml. CXR without acute process.  She remains on Symbicort.. Rate use of albuterol.  Previous PFT 2015 with FEV1 93%, ratio 85, +BD response.  Is working on weight loss. Is down 40lbs since last ov.  Struggles with fluid retention, on Lasix 40mg  Twice daily  .  Has chronic LE swelling. She has CKD followed by [name].  Remains on o2 at 2l/m .  Split night sleep study in 07/2014 showed mild OSA only with AHI 6.9. O2 sats nml on 2l/m .  Over last 2 weeks has had to use albuterol 1 x day.  Since ER she is some better but not back to baseline. Still has intermittent wheezing but decreased .  No chest pain, orthopnea, edema or fever.  Flu shot is utd.  FENO today 04/28/2016  is 70  Moved here in 2015 From [name].  Dx with lung cancer 2012, tx with radiation 2013. At [facility].  Now sees Dr. Irene Limbo with Cancer center . Has CT scan due 05/26/16 . No evidence of recurrence based on PET/CT scan done on 05/20/2015. She is a never smoker but has second hand smoke exposure She also carries dx of Sarcoid from previous bx. (followed previously by Dr. Bartholome Bill at [facility], with lymphadenopathy in chest , abd w/ possible spleen and liver involvement.  She has right anterior chest wall slowly growing masslike lesion in the right pectoralis minor muscle. Unclear etiology with FNA. Followed on CT chest . She is on prednisone 10mg  daily for gout never less   rec Continue on Symbicort 2 puffs Twice daily   Add Zyrtec 10mg  At bedtime  As needed  Drainage.  Add Pepcid 20mg  At bedtime  .  Increase prednisone 20mg   daily for 1 week then back to 10mg .     06/01/2016  f/u ov/Pamela [name] re: chronic asthma/sarcoid/ symb 80 2bid/ saba hfa and neb and pred back to 10 mg daily (floor)  Chief Complaint  Patient presents with  . Follow-up    Breathing has improved some since the last visit. She still wheezes occ wheezing. She has occ cough with min yellow sputum.  She is using albuterol inhaler 2 x per wk on average and she uses neb 3 x per wk on average.   Doe x room to room/ sleeping ok on 2lpm with hob elevation 30 degrees rec Plan A = Automatic = increase Symbicort to 160 Take 2 puffs first thing in am and then another 2 puffs about 12 hours later. (ok to use up your symbicort 80 if you run out of the 160 before next visit)  Work on inhaler technique:  relax and gently blow all the way out then take a nice smooth deep breath back in, triggering the inhaler at same time you start breathing in.  Hold for up to 5 seconds if you can. Blow out thru nose. Rinse and gargle with water when done Plan B = Backup Only use your albuterol as a rescue medication  Plan C = Crisis - only use your albuterol nebulizer if you first try Plan B and it fails to help      06/16/16  NP rec Follow med calendar closely and bring to each visit.  Continue on current regimen .    02/22/2018  f/u ov/Pamela [name] re: asthma / off 02 / symb/flonase / does not use med cal as rec  Chief Complaint  Patient presents with  . Follow-up    Breathing has been doing well. She states she has had cough with clear sputum for the past wk. She rarely uses albuterol inhaler or neb.   Dyspnea:  MMRC3 = can't walk 100 yards even at a slow pace at a flat grade s stopping due to sob or back Cough: not much Sleeping: < 30 degrees multiple pillows  SABA use: as above  rec Work on inhaler technique:  -  late add: consider adding singulair if not well controlled      08/02/2018 acute extended ov/Pamela [name] re: sym 160 2bid / no longer flonase / was on pred 2.5 mg qod  Chief Complaint  Patient presents with  . Acute Visit    Pt c/o cough with yellow to green sputum for the past 5 days. She had fever yesterday.  She has been having to use her rescue inhaler and neb daily since her symptoms started.    states was doing fine on just symb 160  2 bidwhen abruptly ill x 5 d prior to OV  With st, fever, wheeze/ cough prod puruelnt sputum and fever one day prior to OV  But not on day of ov, feels better p saba  Previously not needing ventolin or neb but up to bid now -did not realize she could go to every 4 hours as needed as per the action plan she was already provided.   No obvious patterns day to day or daytime variability or assoc   mucus plugs or hemoptysis or cp or chest tightness, s or overt sinus or hb symptoms.    Also denies any obvious fluctuation of symptoms with weather or environmental changes or other aggravating or alleviating factors except as outlined above   No unusual exposure hx or h/o childhood pna/ asthma  or knowledge of premature birth.  Current Allergies, Complete Past Medical History, Past Surgical History, Family History, and Social History were reviewed in [employer] record.  ROS  The following are not active complaints unless bolded Hoarseness, sore throat, dysphagia, dental problems, itching, sneezing,  nasal congestion or discharge of excess mucus or purulent secretions, ear ache,   fever, chills, sweats, unintended wt loss or wt gain, classically pleuritic or exertional cp,  orthopnea pnd or arm/hand swelling  or leg swelling, presyncope, palpitations, abdominal pain, anorexia, nausea, vomiting, diarrhea  or change in bowel habits or change in bladder habits, change in stools or change in urine, dysuria, hematuria,  rash, arthralgias, visual complaints,  headache, numbness, weakness or ataxia or problems with walking or coordination,  change in mood or  memory.        Current Meds  Medication Sig  . acetaminophen (TYLENOL) 500 MG tablet Take 1 tablet (500 mg total) by mouth every 6 (six) hours as needed.  [name] Kitchen albuterol (PROAIR HFA) 108 (90 Base) MCG/ACT inhaler Inhale 2 puffs into the lungs every 6 (six) hours as needed for wheezing or shortness of breath.  [name] Kitchen albuterol (PROVENTIL) (2.5 MG/3ML) 0.083% nebulizer solution Take 3 mLs (2.5 mg total) by nebulization every 6 (six) hours as needed for wheezing or shortness of breath.  [name] Kitchen aspirin 81 MG tablet Take 1 tablet (81 mg total) by mouth daily.  . cetirizine (ZYRTEC) 10 MG tablet Take 1 tablet (10 mg total) by mouth daily.  [name] Kitchen docusate sodium (COLACE) 100 MG capsule Take 1 capsule (100 mg total) by mouth daily.  . febuxostat (ULORIC) 40 MG tablet TAKE 1 TABLET (40 MG TOTAL) BY MOUTH DAILY.  . furosemide (LASIX) 40 MG tablet Take 1 tablet (40 mg total) by mouth 2 (two) times daily. (Patient taking differently: 2 every am and 1 every evening)  . potassium chloride (K-DUR) 10 MEQ tablet Take 1 tablet (10 mEq total) by mouth daily.  . SYMBICORT 160-4.5 MCG/ACT inhaler INHALE 2 PUFFS BY MOUTH TWICE DAILY  .                    Objective:   Physical Exam   obese bf nad at rest sitting    Vital signs reviewed - Note on arrival 02 sats  95% on RA     11/30/2014  343 > 06/06/2015  355 > 07/03/2015  360 > 06/01/2016  358 > 10/15/2016  366 > 02/25/2017   364 > 02/22/2018  365 > 08/02/2018  360   HEENT: dentures in place, nl  turbinates bilaterally, and oropharynx. Nl external ear canals without cough reflex   NECK :  without JVD/Nodes/TM/ nl carotid upstrokes bilaterally   LUNGS: no acc muscle use,  Nl contour chest  With insp/exp rhonchi  bilaterally with  cough on  exp maneuvers   CV:  RRR  no s3 or murmur or increase in P2, and no edema   ABD:  Massively obese/ nontender with limited   inspiratory excursion in the supine position. No bruits or organomegaly appreciated, bowel sounds nl  MS:  Nl gait/ ext warm without deformities, calf tenderness, cyanosis or clubbing No obvious joint restrictions   SKIN: warm and dry without lesions    NEURO:  alert, approp, nl sensorium with  no motor or cerebellar deficits apparent.     I personally reviewed images and agree with radiology impression as follows:   Chest CT w/o contrast 05/23/18 Scarring and  volume loss in the anterior segment right upper lobe. A few pulmonary nodules measuring up to 4 mm are unchanged and considered benign. No pleural fluid. Airway is unremarkable.

## 2018-08-03 ENCOUNTER — Encounter: Payer: Self-pay | Admitting: Internal Medicine

## 2018-08-03 NOTE — Assessment & Plan Note (Signed)
Onset asthma 1990's while living in [name]  pfts 07/31/13  FEV1  1.53 (58%) with ratio 66 > p saba ratio 74 FEV1 1.68 (64%)  - -04/27/2014  try dulera 100 2 bid  - PFT's 07/19/2014 FEV1 2.00 (93%) and ratio 85 after 21% improvement from saba with no inhalers x one week  - trial off acei 12/01/2014 due to pseudoasthma component > resolved  - Spirometry 06/01/2016  FEV1 1.06 (48%)  Ratio 61  - FENO 06/01/2016  =   85 on symbicort 80 x2 > increase to 160  - Prednisone 10 mg floor x years, rec increase  to 20 mg if needing neb  - 10/15/2016 reduced pred to 5 mg daily (gout flares with taper)  - Spirometry 02/22/2018  FEV1 1.23 (57%)  Ratio 71 with min curvature  - FENO 02/22/2018  = 61 - 02/22/2018  After extensive coaching inhaler device  effectiveness =    90% from floor of 75%    08/02/2018  After extensive coaching inhaler device,  effectiveness =    90%   Chronically she has had good control of her airways disease on Symbicort 162 every 12 hours but now is acutely ill for the last several days with what sounds like a viral URI although is difficult to be sure here.  I therefore recommended a Z-Pak and prednisone and return for follow-up PFTs to make sure she is back to good control and if not add  trial of  Singulair as previously planned.  Needs feno on return

## 2018-08-03 NOTE — Assessment & Plan Note (Addendum)
Complicated by hbp/ pre-dm  . Body mass index is 58.11 kg/m.  -  trending down slightly  Lab Results  Component Value Date   TSH 3.934 05/21/2014     Contributing to gerd risk/ doe/reviewed the need and the process to achieve and maintain neg calorie balance > defer f/u primary care including intermittently monitoring thyroid status      I had an extended discussion with the patient reviewing all relevant studies completed to date and  lasting 25 minutes of a 40  minute acute office  visit   re  severe non-specific but potentially very serious refractory respiratory symptoms of uncertain and potentially multiple  Etiologies.   See device teaching which extended face to face time for this visit    Each maintenance medication was reviewed in detail including most importantly the difference between maintenance and prns and under what circumstances the prns are to be triggered using an action plan format that is not reflected in the computer generated alphabetically organized AVS.    Please see AVS for specific instructions unique to this office visit that I personally wrote and verbalized to the the pt in detail and then reviewed with pt  by my nurse highlighting any changes in therapy/plan of care  recommended at today's visit.

## 2018-08-17 ENCOUNTER — Other Ambulatory Visit: Payer: Self-pay

## 2018-08-17 DIAGNOSIS — M109 Gout, unspecified: Secondary | ICD-10-CM

## 2018-08-17 MED ORDER — FEBUXOSTAT 40 MG PO TABS: ORAL_TABLET | ORAL | 4 refills | Status: DC

## 2018-08-30 ENCOUNTER — Ambulatory Visit (INDEPENDENT_AMBULATORY_CARE_PROVIDER_SITE_OTHER): Payer: Medicare Other | Admitting: Internal Medicine

## 2018-08-30 ENCOUNTER — Encounter: Payer: Self-pay | Admitting: Internal Medicine

## 2018-08-30 VITALS — BP 112/70 | HR 71 | Ht 66.0 in | Wt 366.0 lb

## 2018-08-30 DIAGNOSIS — J441 Chronic obstructive pulmonary disease with (acute) exacerbation: Secondary | ICD-10-CM

## 2018-08-30 DIAGNOSIS — J45901 Unspecified asthma with (acute) exacerbation: Secondary | ICD-10-CM

## 2018-08-30 DIAGNOSIS — J4531 Mild persistent asthma with (acute) exacerbation: Secondary | ICD-10-CM

## 2018-08-30 LAB — PULMONARY FUNCTION TEST
DL/VA % pred: 154 %
DL/VA: 6.37 ml/min/mmHg/L
DLCO unc % pred: 112 %
DLCO unc: 23.9 ml/min/mmHg
FEF 25-75 PRE: 1.69 L/s
FEF 25-75 Post: 1.91 L/sec
FEF2575-%CHANGE-POST: 13 %
FEF2575-%PRED-POST: 94 %
FEF2575-%PRED-PRE: 83 %
FEV1-%Change-Post: 5 %
FEV1-%PRED-POST: 78 %
FEV1-%Pred-Pre: 74 %
FEV1-PRE: 1.59 L
FEV1-Post: 1.69 L
FEV1FVC-%Change-Post: -4 %
FEV1FVC-%PRED-PRE: 105 %
FEV6-%CHANGE-POST: 11 %
FEV6-%PRED-POST: 80 %
FEV6-%Pred-Pre: 72 %
FEV6-Post: 2.13 L
FEV6-Pre: 1.92 L
FEV6FVC-%Pred-Post: 103 %
FEV6FVC-%Pred-Pre: 103 %
FVC-%CHANGE-POST: 11 %
FVC-%PRED-POST: 77 %
FVC-%Pred-Pre: 69 %
FVC-Post: 2.13 L
FVC-Pre: 1.92 L
POST FEV1/FVC RATIO: 79 %
Post FEV6/FVC ratio: 100 %
Pre FEV1/FVC ratio: 83 %
Pre FEV6/FVC Ratio: 100 %

## 2018-08-30 LAB — NITRIC OXIDE: Nitric Oxide: 56

## 2018-08-30 NOTE — Progress Notes (Signed)
PFT completed today.  

## 2018-08-30 NOTE — Progress Notes (Signed)
Subjective:    Patient ID: Pamela [name], female    DOB: 30-Apr-1951  MRN: [id number]   Brief patient profile:  67 yobf never smoker with 1st asthma attack in 1990s and on maint rx since late 90's  and freq courses of prednisone=  3-4 x per year despite advair and spiriva and freq saba referred to pulmonary clinic 04/27/2014 by Dr [name] s/p CT Bx 06/25/11 > no path report epic  but per oncology = [id number] adenoca of lung >  RT only per pt RUL completed 09/2011.     History of Present Illness  04/27/2014 1st [facility] office visit/ Pamela [name]   Chief Complaint  Patient presents with  . Pulmonary Consult    Referred by Dr. [name]. [name]  Pt recently moved to [place] from [name] and states needs to establish with new pulmonary MD.  She c/o DOE and cough, and states that she feels these symptoms are currently under control.    on prednisone 10 mg daily  since late July 2015 and still on freq saba and 2lpm  Already used   2puffs proair am of ov  rec Stop spiriva and advair Start dulera 100 Take 2 puffs first thing in am and then another 2 puffs about 12 hours later.  Work on inhaler technique:  relax and gently blow all the way out then take a nice smooth deep breath back in, triggering the inhaler at same time you start breathing in.  Hold for up to 5 seconds if you can.  Rinse and gargle with water when done Only use your albuterol (proair) as a rescue medication  If proair not helping, then use the neb and if needing the neb more than occastional, then take prednisone 10 mg daily  Please schedule a follow up office visit in 4 weeks, sooner if needed with all inhalers in hand for pfts on return   11/30/2014 f/u ov/Pamela [name] re: mild intermittent asthma ? Related to weather / still on acei plus on prednisone 10 mg daily for gout  Chief Complaint  Patient presents with  . Acute Visit    Pt c/o increased SOB for the past month. She is  also coughing and wheezing. Cough is prod with minimal yellow to clear sputum. She uses proair 2 x daily on average.   Also due for CT chest to f/u dx of lung ca dx March 2013  never surgery/ RT only  Onset of dry cough / wheeze was insidious/ pattern is progressively worse day >> noct with increasing  need for saba but very poor hfa  rec Stop lisinopril and start valsartan 80 mg one daily instead  Only use your albuterol as a rescue medication   Restart dulera 100 Take 2 puffs first thing in am and then another 2 puffs about 12 hours later until you only need  your rescue inhaler no more than twice weekly for cough/wheeze/ short of breath  Follow up with Dr [name] as planned and only see me if needed    06/06/2015  f/u ov/Pamela [name] re: worse since finished pred ? One week prior to [place]  / was taking for gout  Chief Complaint  Patient presents with  . Acute Visit    Increased SOB with or without exertion for the past 4 days. She also c/o cough and wheezing. Cough is prod with clear sputum.     Was doing much better  while on prednisone and gradually worse since off  Last used proventil about 7 am prior to OV  At 1130  On symbicort but hfa quite poor > see a/p Has neb but no meds for it  rec Prednisone Take 4 for three days 3 for three days 2 for three days 1 for three days and stop Work on inhaler technique:  relax and gently blow all the way out then take a nice smooth deep breath back in, triggering the inhaler at same time you start breathing in.  Hold for up to 5 seconds if you can. Blow out thru nose. Rinse and gargle with water when done Plan A = Automatic = symbicort 80 Take 2 puffs first thing in am and then another 2 puffs about 12 hours later.  Plan B = backup  Only use your albuterol(proair)   Plan C = crisis - only use nebulizer albuterol if you try the proair and it doesn't work         02/22/2018  f/u ov/Pamela [name] re: asthma / off 02 / symb/flonase / does not use med cal as rec    Chief Complaint  Patient presents with  . Follow-up    Breathing has been doing well. She states she has had cough with clear sputum for the past wk. She rarely uses albuterol inhaler or neb.   Dyspnea:  MMRC3 = can't walk 100 yards even at a slow pace at a flat grade s stopping due to sob or back Cough: not much Sleeping: < 30 degrees multiple pillows  SABA use: as above  rec Work on inhaler technique:  - late add: consider adding singulair if not well controlled      08/02/2018 acute extended ov/Pamela [name] re: sym 160 2bid / no longer flonase / was on pred 2.5 mg qod  Chief Complaint  Patient presents with  . Acute Visit    Pt c/o cough with yellow to green sputum for the past 5 days. She had fever yesterday.  She has been having to use her rescue inhaler and neb daily since her symptoms started.    states was doing fine on just symb 160  2 bid when abruptly ill x 5 d prior to OV  With st, fever, wheeze/ cough prod puruelnt sputum and fever one day prior to OV  But not on day of ov, feels better p saba  Previously not needing ventolin or neb but up to bid now -did not realize she could go to every 4 hours as needed as per the action plan she was already provided.  rec zpak  Prednisone 10 mg take  4 each am x 2 days,   2 each am x 2 days,  1 each am x 2 days and stop   Please schedule a follow up office visit in 4 weeks, sooner if needed pfts on return - add also needs feno on return     08/30/2018  f/u ov/Pamela [name] re: moderate chronic asthma maint on symb 160 2bid no prednisone listed  Chief Complaint  Patient presents with  . Follow-up    PFT's done today. Breathing has improved since the last visit. She uses her albuterol inhaler rarely.   Dyspnea:  Walks at [employer] with cane  Cough: none Sleeping:  Bed flat, 4 pillows  SABA use: none 02: no    No obvious day to day or daytime variability or assoc excess/ purulent sputum or mucus plugs or hemoptysis or cp or chest  tightness,  subjective wheeze or overt sinus or hb symptoms.   Sleeping as above  without nocturnal  or early am exacerbation  of respiratory  c/o's or need for noct saba. Also denies any obvious fluctuation of symptoms with weather or environmental changes or other aggravating or alleviating factors except as outlined above   No unusual exposure hx or h/o childhood pna/ asthma or knowledge of premature birth.  Current Allergies, Complete Past Medical History, Past Surgical History, Family History, and Social History were reviewed in [employer] record.  ROS  The following are not active complaints unless bolded Hoarseness, sore throat, dysphagia, dental problems, itching, sneezing,  nasal congestion or discharge of excess mucus or purulent secretions, ear ache,   fever, chills, sweats, unintended wt loss or wt gain, classically pleuritic or exertional cp,  orthopnea pnd or arm/hand swelling  or leg swelling, presyncope, palpitations, abdominal pain, anorexia, nausea, vomiting, diarrhea  or change in bowel habits or change in bladder habits, change in stools or change in urine, dysuria, hematuria,  rash, arthralgias, visual complaints, headache, numbness, weakness or ataxia or problems with walking/walks with cane  or coordination,  change in mood or  memory.        Current Meds  Medication Sig  . acetaminophen (TYLENOL) 500 MG tablet Take 1 tablet (500 mg total) by mouth every 6 (six) hours as needed.  [name] Kitchen albuterol (PROAIR HFA) 108 (90 Base) MCG/ACT inhaler Inhale 2 puffs into the lungs every 6 (six) hours as needed for wheezing or shortness of breath.  [name] Kitchen albuterol (PROVENTIL) (2.5 MG/3ML) 0.083% nebulizer solution Take 3 mLs (2.5 mg total) by nebulization every 6 (six) hours as needed for wheezing or shortness of breath.  [name] Kitchen aspirin 81 MG tablet Take 1 tablet (81 mg total) by mouth daily.  [name] Kitchen docusate sodium (COLACE) 100 MG capsule Take 1 capsule (100 mg total) by mouth daily.  .  febuxostat (ULORIC) 40 MG tablet TAKE 1 TABLET (40 MG TOTAL) BY MOUTH DAILY.  . furosemide (LASIX) 40 MG tablet Take 1 tablet (40 mg total) by mouth 2 (two) times daily. (Patient taking differently: 2 every am and 1 every evening)  . potassium chloride (K-DUR) 10 MEQ tablet Take 1 tablet (10 mEq total) by mouth daily.  . SYMBICORT 160-4.5 MCG/ACT inhaler INHALE 2 PUFFS BY MOUTH TWICE DAILY         Objective:   Physical Exam   obese pleasant bf nad   Vital signs reviewed - Note on arrival 02 sats  94% on RA      11/30/2014  343 > 06/06/2015  355 > 07/03/2015  360 > 06/01/2016  358 > 10/15/2016  366 > 02/25/2017   364 > 02/22/2018  365 > 08/02/2018  360 >  08/30/2018  366       HEENT: full dentures/ nl  turbinates bilaterally, and oropharynx. Nl external ear canals without cough reflex   NECK :  without JVD/Nodes/TM/ nl carotid upstrokes bilaterally   LUNGS: no acc muscle use,  Nl contour chest which is clear to A and P bilaterally without cough on insp or exp maneuvers   CV:  RRR  no s3 or murmur or increase in P2, and no edema   ABD:  Massively obese/  nontender with poor inspiratory excursion in the supine position. No bruits or organomegaly appreciated, bowel sounds nl  MS:  Slow walk with cane/ ext warm without deformities, calf tenderness, cyanosis or clubbing No obvious joint restrictions  SKIN: warm and dry without lesions    NEURO:  alert, approp, nl sensorium with  no motor or cerebellar deficits apparent.

## 2018-08-30 NOTE — Patient Instructions (Addendum)
Please schedule a follow up visit in 6  months but call sooner if needed  - add singulair 10 mg daily due to high feno and repeat feno next ov

## 2018-08-31 ENCOUNTER — Encounter: Payer: Self-pay | Admitting: Internal Medicine

## 2018-08-31 NOTE — Assessment & Plan Note (Signed)
Complicated by hbp/ pre-dm  Body mass index is 59.07 kg/m.  -  trending up Lab Results  Component Value Date   TSH 3.934 05/21/2014     Contributing to gerd risk/ doe/reviewed the need and the process to achieve and maintain neg calorie balance > defer f/u primary care including intermittently monitoring thyroid status     I had an extended discussion with the patient reviewing all relevant studies completed to date and  lasting 15 to 20 minutes of a 25 minute visit    Each maintenance medication was reviewed in detail including most importantly the difference between maintenance and prns and under what circumstances the prns are to be triggered using an action plan format that is not reflected in the computer generated alphabetically organized AVS.     Please see AVS for specific instructions unique to this visit that I personally wrote and verbalized to the the pt in detail and then reviewed with pt  by my nurse highlighting any  changes in therapy recommended at today's visit to their plan of care.

## 2018-08-31 NOTE — Assessment & Plan Note (Signed)
Onset asthma 1990's while living in [name]  pfts 07/31/13  FEV1  1.53 (58%) with ratio 66 > p saba ratio 74 FEV1 1.68 (64%)  - -04/27/2014  try dulera 100 2 bid  - PFT's 07/19/2014 FEV1 2.00 (93%) and ratio 85 after 21% improvement from saba with no inhalers x one week  - trial off acei 12/01/2014 due to pseudoasthma component > resolved  - Spirometry 06/01/2016  FEV1 1.06 (48%)  Ratio 61  - FENO 06/01/2016  =   85 on symbicort 80 x2 > increase to 160  - Prednisone 10 mg floor x years, rec increase  to 20 mg if needing neb  - 10/15/2016 reduced pred to 5 mg daily (gout flares with taper)  - Spirometry 02/22/2018  FEV1 1.23 (57%)  Ratio 71 with min curvature  - FENO 02/22/2018  = 61  08/02/2018  After extensive coaching inhaler device,  effectiveness =    90%  FENO 08/30/2018  =  57 p symb 160 2bid > rec add singulair 10 mg daily  PFT's  08/30/2018  FEV1 1.69 (78 % ) ratio 0.79  p 54 % improvement from saba p symb 160 x2  prior to study with DLCO  112 % corrects to 154  % for alv volume     All goals of chronic asthma control met including optimal function and elimination of symptoms with minimal need for rescue therapy.  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   Contingencies discussed in full including contacting this office immediately if not controlling the symptoms using the rule of two's.

## 2018-09-01 ENCOUNTER — Telehealth: Payer: Self-pay | Admitting: *Deleted

## 2018-09-01 ENCOUNTER — Encounter: Payer: Self-pay | Admitting: Family Medicine

## 2018-09-01 ENCOUNTER — Emergency Department (HOSPITAL_COMMUNITY): Payer: Medicare Other

## 2018-09-01 ENCOUNTER — Emergency Department (HOSPITAL_COMMUNITY)
Admission: EM | Admit: 2018-09-01 | Discharge: 2018-09-01 | Disposition: A | Discharge status: Home / Self Care | Payer: Medicare Other | Attending: Emergency Medicine | Admitting: Emergency Medicine

## 2018-09-01 ENCOUNTER — Ambulatory Visit (INDEPENDENT_AMBULATORY_CARE_PROVIDER_SITE_OTHER): Payer: Medicare Other | Admitting: Family Medicine

## 2018-09-01 VITALS — BP 102/48 | HR 66 | Temp 98.2°F | Resp 18 | Ht 66.0 in | Wt 368.0 lb

## 2018-09-01 DIAGNOSIS — R7303 Prediabetes: Secondary | ICD-10-CM | POA: Diagnosis not present

## 2018-09-01 DIAGNOSIS — R2243 Localized swelling, mass and lump, lower limb, bilateral: Secondary | ICD-10-CM | POA: Diagnosis not present

## 2018-09-01 DIAGNOSIS — I5032 Chronic diastolic (congestive) heart failure: Secondary | ICD-10-CM | POA: Diagnosis not present

## 2018-09-01 DIAGNOSIS — R079 Chest pain, unspecified: Secondary | ICD-10-CM | POA: Diagnosis not present

## 2018-09-01 DIAGNOSIS — R600 Localized edema: Secondary | ICD-10-CM | POA: Diagnosis not present

## 2018-09-01 DIAGNOSIS — J449 Chronic obstructive pulmonary disease, unspecified: Secondary | ICD-10-CM | POA: Insufficient documentation

## 2018-09-01 DIAGNOSIS — Z85118 Personal history of other malignant neoplasm of bronchus and lung: Secondary | ICD-10-CM | POA: Diagnosis not present

## 2018-09-01 DIAGNOSIS — I110 Hypertensive heart disease with heart failure: Secondary | ICD-10-CM | POA: Diagnosis not present

## 2018-09-01 DIAGNOSIS — I10 Essential (primary) hypertension: Secondary | ICD-10-CM | POA: Diagnosis not present

## 2018-09-01 DIAGNOSIS — R0602 Shortness of breath: Secondary | ICD-10-CM | POA: Diagnosis not present

## 2018-09-01 DIAGNOSIS — R609 Edema, unspecified: Secondary | ICD-10-CM | POA: Diagnosis not present

## 2018-09-01 DIAGNOSIS — J45909 Unspecified asthma, uncomplicated: Secondary | ICD-10-CM | POA: Diagnosis not present

## 2018-09-01 DIAGNOSIS — Z79899 Other long term (current) drug therapy: Secondary | ICD-10-CM | POA: Diagnosis not present

## 2018-09-01 DIAGNOSIS — R52 Pain, unspecified: Secondary | ICD-10-CM | POA: Diagnosis not present

## 2018-09-01 DIAGNOSIS — R062 Wheezing: Secondary | ICD-10-CM | POA: Diagnosis not present

## 2018-09-01 DIAGNOSIS — Z7982 Long term (current) use of aspirin: Secondary | ICD-10-CM | POA: Insufficient documentation

## 2018-09-01 LAB — CBC WITH DIFFERENTIAL/PLATELET
ABS IMMATURE GRANULOCYTES: 0.01 10*3/uL (ref 0.00–0.07)
Basophils Absolute: 0 10*3/uL (ref 0.0–0.1)
Basophils Relative: 0 %
EOS ABS: 0.7 10*3/uL — AB (ref 0.0–0.5)
Eosinophils Relative: 16 %
HEMATOCRIT: 39.9 % (ref 36.0–46.0)
Hemoglobin: 12 g/dL (ref 12.0–15.0)
Immature Granulocytes: 0 %
Lymphocytes Relative: 25 %
Lymphs Abs: 1.2 10*3/uL (ref 0.7–4.0)
MCH: 22.1 pg — AB (ref 26.0–34.0)
MCHC: 30.1 g/dL (ref 30.0–36.0)
MCV: 73.6 fL — AB (ref 80.0–100.0)
MONO ABS: 0.5 10*3/uL (ref 0.1–1.0)
MONOS PCT: 11 %
NEUTROS ABS: 2.2 10*3/uL (ref 1.7–7.7)
Neutrophils Relative %: 48 %
Platelets: 177 10*3/uL (ref 150–400)
RBC: 5.42 MIL/uL — ABNORMAL HIGH (ref 3.87–5.11)
RDW: 15.1 % (ref 11.5–15.5)
WBC: 4.6 10*3/uL (ref 4.0–10.5)
nRBC: 0 % (ref 0.0–0.2)

## 2018-09-01 LAB — POCT URINALYSIS DIPSTICK
Bilirubin, UA: NEGATIVE
Glucose, UA: NEGATIVE
Ketones, UA: NEGATIVE
Nitrite, UA: NEGATIVE
Protein, UA: NEGATIVE
Spec Grav, UA: 1.01 (ref 1.010–1.025)
Urobilinogen, UA: 0.2 E.U./dL
pH, UA: 6 (ref 5.0–8.0)

## 2018-09-01 LAB — COMPREHENSIVE METABOLIC PANEL
ALBUMIN: 3.5 g/dL (ref 3.5–5.0)
ALT: 16 U/L (ref 0–44)
ANION GAP: 9 (ref 5–15)
AST: 27 U/L (ref 15–41)
Alkaline Phosphatase: 111 U/L (ref 38–126)
BILIRUBIN TOTAL: 0.7 mg/dL (ref 0.3–1.2)
BUN: 23 mg/dL (ref 8–23)
CALCIUM: 8.7 mg/dL — AB (ref 8.9–10.3)
CO2: 28 mmol/L (ref 22–32)
Chloride: 103 mmol/L (ref 98–111)
Creatinine, Ser: 1.61 mg/dL — ABNORMAL HIGH (ref 0.44–1.00)
GFR calc non Af Amer: 33 mL/min — ABNORMAL LOW (ref >60)
GFR, EST AFRICAN AMERICAN: 38 mL/min — AB (ref >60)
GLUCOSE: 98 mg/dL (ref 70–99)
POTASSIUM: 4.3 mmol/L (ref 3.5–5.1)
SODIUM: 140 mmol/L (ref 135–145)
TOTAL PROTEIN: 7.1 g/dL (ref 6.5–8.1)

## 2018-09-01 LAB — POCT GLYCOSYLATED HEMOGLOBIN (HGB A1C): Hemoglobin A1C: 5.5 % (ref 4.0–5.6)

## 2018-09-01 LAB — BRAIN NATRIURETIC PEPTIDE: B Natriuretic Peptide: 65.8 pg/mL (ref 0.0–100.0)

## 2018-09-01 LAB — TROPONIN I: Troponin I: <0.03 ng/mL (ref <0.03)

## 2018-09-01 MED ORDER — MONTELUKAST SODIUM 10 MG PO TABS: 10.0000 mg | ORAL_TABLET | Freq: Every day | ORAL | 11 refills | Status: DC

## 2018-09-01 MED ORDER — FUROSEMIDE 10 MG/ML IJ SOLN
80.0000 mg | Freq: Once | INTRAMUSCULAR | Status: AC
  Administered 2018-09-01: 80 mg via INTRAVENOUS
  Filled 2018-09-01: qty 8

## 2018-09-01 NOTE — Telephone Encounter (Signed)
-----   Message from Tanda Rockers, MD sent at 08/31/2018  9:11 AM EST ----- Let her know I reviewed her records and the plan was to add Singulair if her Lanice Shirts is still elevated which it is.  Also make sure that she is no longer taking prednisone as I am not sure exactly when she stopped it as she was taking it for gout.

## 2018-09-01 NOTE — Telephone Encounter (Signed)
Spoke with the pt  She is not on pred so the med list is correct Rx for singulair sent

## 2018-09-01 NOTE — Patient Instructions (Signed)

## 2018-09-01 NOTE — ED Notes (Signed)
Pt. Ambulated with no assist. Pt. Walked to the restroom with a cane. O2 @93 % and pulse @88 . Nurse aware of pt.

## 2018-09-01 NOTE — ED Notes (Signed)
Bed: AO13 Expected date:  Expected time:  Means of arrival:  Comments: 67yo bilateral leg pain, redness, swelling

## 2018-09-01 NOTE — Discharge Instructions (Signed)
Increase your Lasix to 3 tablets (120 mg) in the morning and 2 tablets (80 mg) at night.  If you develop trouble breathing, worsening leg swelling or pain, chest pain, or any other new/concerning symptoms then return to the ER for evaluation.

## 2018-09-01 NOTE — ED Provider Notes (Signed)
[facility] DEPT Provider Note   CSN: [id number] Arrival date & time: 09/01/18  1209     History   Chief Complaint Chief Complaint  Patient presents with  . Leg Swelling    HPI Pamela [name] is a 68 y.o. female.  HPI  68 year old female presents with leg swelling.  This is been worsening for about a month.  The right is worse than the left but this is always the case from a prior accident to her right leg.  She felt a little short of breath with walking yesterday.  There has been no chest pain.  She is on 80 mg Lasix in the morning and 40 mg at night since December.  This is been unchanged and she has not been missing any doses.  No swelling anywhere else such as in her hands.  No significant cough.  Both legs feel tight and are painful though no drainage at this time.  Past Medical History:  Diagnosis Date  . Arthritis   . Asthma   . Cancer ([place])    lung, adenocarcinoma right lung 2012  . CHF (congestive heart failure) (HCC)    Preserved EF  . Congenital single kidney    With chronic kidney disease  . COPD (chronic obstructive pulmonary disease) ([place])   . Gout   . Hypertension   . 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 Mass of chest wall, right    Right chest wall mass 7.3 cm biopsy on 12/13/2013. Patient notes it was consistent with sarcoidosis but actual pathology results not available.  . Oxygen deficiency   . Sarcoidosis   . Sickle cell trait ([place])   . Sleep apnea     Patient Active Problem List   Diagnosis Date Noted  . Asthma, chronic obstructive, with acute exacerbation ([place]) 08/02/2018  . Plantar fasciitis 04/14/2017  . Microcytosis 11/28/2015  . 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 Onychomycosis 01/09/2015  . Ingrown nail 01/09/2015  . Pain in lower limb 01/09/2015  . Chronic respiratory failure with  hypoxia and hypercapnia ([place]) 07/19/2014  . CHF (congestive heart failure) ([place]) 06/11/2014  . Anemia, iron deficiency 05/27/2014  . Acute gouty arthritis 05/25/2014  . Acute renal failure superimposed on stage 3 chronic kidney disease ([place]) 05/25/2014  . Obstructive sleep apnea 05/25/2014  . Joint pain 05/24/2014  . Chronic diastolic CHF (congestive heart failure) ([place])   . Sarcoidosis   . Metabolic syndrome 56/21/3086  . Asthma, chronic 04/27/2014  . Anemia of chronic disease 04/19/2014  . Morbid (severe) obesity due to excess calories ([place]) 04/18/2014  . Immunization due 04/18/2014  . Need for prophylactic vaccination and inoculation against influenza 04/18/2014  . Prediabetes 04/13/2014  . Essential hypertension 04/13/2014  . Lower extremity edema 04/13/2014  . History of lung cancer 04/13/2014    Past Surgical History:  Procedure Laterality Date  . LUNG BIOPSY    . TUBAL LIGATION    . VEIN LIGATION AND STRIPPING       OB History   No obstetric history on file.      Home Medications    Prior to Admission medications   Medication Sig Start Date End Date Taking? Authorizing Provider  acetaminophen (TYLENOL) 500 MG tablet Take 1 tablet (500 mg total) by mouth every 6 (six) hours as needed. Patient taking differently: Take 1,000 mg by mouth daily as  needed for mild pain or headache.  04/16/15  Yes Dorena Dew, FNP  albuterol (PROAIR HFA) 108 (90 Base) MCG/ACT inhaler Inhale 2 puffs into the lungs every 6 (six) hours as needed for wheezing or shortness of breath. 04/28/16  Yes Parrett, [name] S, NP  albuterol (PROVENTIL) (2.5 MG/3ML) 0.083% nebulizer solution Take 3 mLs (2.5 mg total) by nebulization every 6 (six) hours as needed for wheezing or shortness of breath. 12/10/17  Yes Dorena Dew, FNP  aspirin 81 MG tablet Take 1 tablet (81 mg total) by mouth daily. 01/02/16  Yes Dorena Dew, FNP  docusate sodium (COLACE) 100 MG capsule Take 1 capsule (100 mg total)  by mouth daily. 01/02/16  Yes Dorena Dew, FNP  febuxostat (ULORIC) 40 MG tablet TAKE 1 TABLET (40 MG TOTAL) BY MOUTH DAILY. 08/17/18  Yes Lanae Boast, FNP  furosemide (LASIX) 40 MG tablet Take 1 tablet (40 mg total) by mouth 2 (two) times daily. Patient taking differently: Take 40 mg by mouth 2 (two) times daily. 46m in the morning and 275min the evening 01/19/18  Yes HoDorena DewFNP  montelukast (SINGULAIR) 10 MG tablet Take 1 tablet (10 mg total) by mouth at bedtime. 09/01/18  Yes WeTanda RockersMD  potassium chloride (K-DUR) 10 MEQ tablet Take 1 tablet (10 mEq total) by mouth daily. 03/14/18  Yes DoLanae BoastFNP  SYMBICORT 160-4.5 MCG/ACT inhaler INHALE 2 PUFFS BY MOUTH TWICE DAILY 02/23/18  Yes WeTanda RockersMD    Family History Family History  Problem Relation Age of Onset  . Hypertension Mother   . Renal Disease Mother   . Cancer Father        stomach  . Heart disease Father        No details  . Cancer Brother   . Diabetes Brother   . Cervical cancer Sister   . Diabetes Sister   . Multiple myeloma Sister     Social History Social History   Tobacco Use  . Smoking status: Never Smoker  . Smokeless tobacco: Never Used  Substance Use Topics  . Alcohol use: No    Alcohol/week: 0.0 standard drinks  . Drug use: No     Allergies   Sulfur   Review of Systems Review of Systems  Constitutional: Negative for fever.  Respiratory: Positive for shortness of breath. Negative for cough.   Cardiovascular: Positive for leg swelling. Negative for chest pain.  Gastrointestinal: Negative for abdominal pain and vomiting.  Musculoskeletal: Positive for myalgias.  All other systems reviewed and are negative.    Physical Exam Updated Vital Signs BP 137/69 (BP Location: Right Arm)   Pulse 66   Temp 97.8 F (36.6 C)   Resp 17   SpO2 96%   Physical Exam Vitals signs and nursing note reviewed.  Constitutional:      General: She is not in acute distress.     Appearance: She is well-developed. She is obese.  HENT:     Head: Normocephalic and atraumatic.     Right Ear: External ear normal.     Left Ear: External ear normal.     Nose: Nose normal.  Eyes:     General:        Right eye: No discharge.        Left eye: No discharge.  Cardiovascular:     Rate and Rhythm: Normal rate and regular rhythm.     Pulses:  Dorsalis pedis pulses are 2+ on the right side and 2+ on the left side.     Heart sounds: Normal heart sounds.  Pulmonary:     Effort: Pulmonary effort is normal.     Breath sounds: Wheezing (mild, expiratory, diffuse) present.  Abdominal:     Palpations: Abdomen is soft.     Tenderness: There is no abdominal tenderness.  Musculoskeletal:     Right lower leg: Edema present.     Left lower leg: Edema present.     Comments: Diffusely swollen lower extremities from calves to ankles, R>L  Skin:    General: Skin is warm and dry.  Neurological:     Mental Status: She is alert.  Psychiatric:        Mood and Affect: Mood is not anxious.      ED Treatments / Results  Labs (all labs ordered are listed, but only abnormal results are displayed) Labs Reviewed  COMPREHENSIVE METABOLIC PANEL - Abnormal; Notable for the following components:      Result Value   Creatinine, Ser 1.61 (*)    Calcium 8.7 (*)    GFR calc non Af Amer 33 (*)    GFR calc Af Amer 38 (*)    All other components within normal limits  CBC WITH DIFFERENTIAL/PLATELET - Abnormal; Notable for the following components:   RBC 5.42 (*)    MCV 73.6 (*)    MCH 22.1 (*)    Eosinophils Absolute 0.7 (*)    All other components within normal limits  BRAIN NATRIURETIC PEPTIDE  TROPONIN I    EKG EKG Interpretation  Date/Time:  Thursday September 01 2018 12:51:29 EST Ventricular Rate:  64 PR Interval:    QRS Duration: 106 QT Interval:  466 QTC Calculation: 481 R Axis:   -61 Text Interpretation:  Sinus rhythm Left anterior fascicular block Abnormal R-wave  progression, late transition no significant change since 2017 Confirmed by Sherwood Gambler [phone]) on 09/01/2018 1:06:26 PM   Radiology Dg Chest 2 View  Result Date: 09/01/2018 CLINICAL DATA:  Pt presents with bilateral lower extremity swelling. R>L. Denies SOB or chest pain, pt sent from Dr office, hx COPD, CHF, lung ca, sarcoidosis EXAM: CHEST - 2 VIEW COMPARISON:  12/27/2017 FINDINGS: The heart size and mediastinal contours are within normal limits. Both lungs are clear. The visualized skeletal structures are unremarkable. IMPRESSION: No active cardiopulmonary disease. Electronically Signed   By: Nolon Nations M.D.   On: 09/01/2018 13:58    Procedures Procedures (including critical care time)  Medications Ordered in ED Medications  furosemide (LASIX) injection 80 mg (80 mg Intravenous Given 09/01/18 1455)     Initial Impression / Assessment and Plan / ED Course  I have reviewed the triage vital signs and the nursing notes.  Pertinent labs & imaging results that were available during my care of the patient were reviewed by me and considered in my medical decision making (see chart for details).     Patient presents with gradually progressive lower extremity swelling.  She reports some mild dyspnea earlier in the day but there is none now.  She is able to ambulate without dropping her sats or feeling short of breath.  Labs are not indicative of acute CHF.  I will adjust her Lasix to be increased over the next few days to be followed up closely with PCP.  Renal function appears at baseline.  We discussed return precautions. Given dose of IV lasix here.  Final Clinical Impressions(s) /  ED Diagnoses   Final diagnoses:  Bilateral lower extremity edema    ED Discharge Orders    None       Sherwood Gambler, MD 09/01/18 [phone]

## 2018-09-01 NOTE — ED Triage Notes (Signed)
Transported by [facility] from doctors office. Hx of CHF and cellulitis. Pt presents with bilateral lower extremity swelling. R>L. Denies SOB or chest pain.

## 2018-09-01 NOTE — Progress Notes (Signed)
Patient [name]   Progress Note: General Provider: Lanae Boast, FNP  SUBJECTIVE:   Pamela [name] is a 68 y.o. female who  has a past medical history of Arthritis, Asthma, Cancer ([place]), CHF (congestive heart failure) ([place]), Congenital single kidney, COPD (chronic obstructive pulmonary disease) ([place]), Gout, Hypertension, Lung cancer ([place]) (2012), Mass of chest wall, right, Oxygen deficiency, Sarcoidosis, Sickle cell trait ([place]), and Sleep apnea.. Patient presents today for Leg Pain; Leg Swelling; Ear Pain (left ear ); Hypertension; and Follow-up (pre diabetes )  Bilateral Leg pain due to retention of fluid x 1 month.  Hx of CHF. Patient has an 8 pound weight gain within the past week. Patient states that she has had shortness of breath and wheezing. O2 97% on room air. Patient states that she has been in contact with her pulmonologist and is supposed to have a refill on singulair. She has a history of abnormal renal function and is followed by nephrology. She  Is on furosemide 120 mg per day with out relief of lower extremity edema. Patient denies exudate from lower extremities at the present time.   Review of Systems  Constitutional: Negative.   HENT: Negative.   Eyes: Negative.   Respiratory: Positive for shortness of breath and wheezing.   Cardiovascular: Positive for leg swelling (bilateral).  Gastrointestinal: Negative.   Genitourinary: Negative.   Musculoskeletal: Positive for joint pain.  Skin: Negative.   Neurological: Negative.        Ambulates with a cane.   Psychiatric/Behavioral: Negative.      OBJECTIVE: BP (!) 102/48 (BP Location: Left Arm, Patient Position: Sitting, Cuff Size: Large)   Pulse 66   Temp 98.2 F (36.8 C) (Oral)   Resp 18   Ht 5\' 6"  (1.676 m)   Wt (!) 368 lb (166.9 kg)   SpO2 97%   BMI 59.40 kg/m   Wt Readings from Last 3 Encounters:  09/01/18 (!) 368 lb (166.9 kg)  08/30/18 (!) 366 lb (166 kg)    08/02/18 (!) 360 lb (163.3 kg)     Physical Exam Vitals signs and nursing note reviewed.  Constitutional:      General: She is not in acute distress.    Appearance: She is well-developed.  HENT:     Head: Normocephalic and atraumatic.  Eyes:     Conjunctiva/sclera: Conjunctivae normal.     Pupils: Pupils are equal, round, and reactive to light.  Neck:     Musculoskeletal: Normal range of motion.  Cardiovascular:     Rate and Rhythm: Normal rate and regular rhythm.     Heart sounds: Normal heart sounds.  Pulmonary:     Effort: Pulmonary effort is normal. No respiratory distress.     Breath sounds: Wheezing (throughout all lung fields expirtory and inspiratory) present.     Comments: Decreased at the lower bilateral lung fields.  Abdominal:     General: Bowel sounds are normal. There is no distension.     Palpations: Abdomen is soft.  Musculoskeletal: Normal range of motion.  Skin:    General: Skin is warm and dry.     Findings: Erythema present.     Comments: Bilateral lower extremities warm to touch. TTP, pitting edema with hyperpimentation and discoloration. Malodorous.   Neurological:     Mental Status: She is alert and oriented to person, place, and time.     Gait: Gait abnormal (ambulates with cane. ).  Psychiatric:  Mood and Affect: Mood normal.        Behavior: Behavior normal.        Thought Content: Thought content normal.        Judgment: Judgment normal.         ASSESSMENT/PLAN:   Recommend transport to the ED due to increase in weight, multiple co-morbidities, wheezing, SOB, fluid not responding to lasix  and low BP.   Return if symptoms worsen or fail to improve, for hospital follow up.    The patient was given clear instructions to go to ER or return to medical center if symptoms do not improve, worsen or new problems develop. The patient verbalized understanding and agreed with plan of care.   Ms. [name], FNP-BC [facility] [address] [place], [place] [address] [phone]

## 2018-09-05 ENCOUNTER — Ambulatory Visit (INDEPENDENT_AMBULATORY_CARE_PROVIDER_SITE_OTHER): Payer: Medicare Other | Admitting: Family Medicine

## 2018-09-05 ENCOUNTER — Encounter: Payer: Self-pay | Admitting: Family Medicine

## 2018-09-05 VITALS — BP 107/63 | HR 69 | Temp 98.2°F | Resp 16 | Ht 64.0 in | Wt 364.0 lb

## 2018-09-05 DIAGNOSIS — E876 Hypokalemia: Secondary | ICD-10-CM

## 2018-09-05 DIAGNOSIS — Z09 Encounter for follow-up examination after completed treatment for conditions other than malignant neoplasm: Secondary | ICD-10-CM | POA: Diagnosis not present

## 2018-09-05 DIAGNOSIS — L03119 Cellulitis of unspecified part of limb: Secondary | ICD-10-CM | POA: Diagnosis not present

## 2018-09-05 DIAGNOSIS — I5032 Chronic diastolic (congestive) heart failure: Secondary | ICD-10-CM | POA: Diagnosis not present

## 2018-09-05 MED ORDER — AMOXICILLIN-POT CLAVULANATE 875-125 MG PO TABS: 1.0000 | ORAL_TABLET | Freq: Two times a day (BID) | ORAL | 0 refills | Status: DC

## 2018-09-05 MED ORDER — POTASSIUM CHLORIDE ER 10 MEQ PO TBCR: 10.0000 meq | EXTENDED_RELEASE_TABLET | Freq: Every day | ORAL | 1 refills | Status: DC

## 2018-09-05 NOTE — Progress Notes (Signed)
Patient [facility]   Progress Note: General Provider: Lanae Boast, FNP  SUBJECTIVE:   Pamela [name] is a 68 y.o. female who  has a past medical history of Arthritis, Asthma, Cancer ([place]), CHF (congestive heart failure) ([place]), Congenital single kidney, COPD (chronic obstructive pulmonary disease) ([place]), Gout, Hypertension, Lung cancer ([place]) (2012), Mass of chest wall, right, Oxygen deficiency, Sarcoidosis, Sickle cell trait ([place]), and Sleep apnea.. Patient presents today for Follow-up (ER follow up from leg swelling ) and Ear Pain (left ear ache ) At last visit, patient sent to the ED due to shortness of breath, increased edema in lower extremities with pain and significant  weight gain. Patient was given IV lasix and discharged home. Patient states that she continues to have pain and swelling in bilateral legs that is unchanged.  Confirmed with [facility] Patient is on 40mg  tablets with instructions of 80mg  in the AM and 40 in PM. Patient was instructed to take 120mg  AM and 80mg  PM. She has noticed a 2 pound decrease in weight since increasing the amount.  Patient reports left ear pain x 1 week.   Review of Systems  Constitutional: Negative.   HENT: Negative.   Eyes: Negative.   Respiratory: Positive for cough and wheezing.   Cardiovascular: Positive for leg swelling.  Gastrointestinal: Negative.   Genitourinary: Negative.   Musculoskeletal: Positive for myalgias.  Skin: Negative.   Neurological: Negative.   Psychiatric/Behavioral: Negative.      OBJECTIVE: BP 107/63 (BP Location: Right Arm, Patient Position: Sitting, Cuff Size: Large)   Pulse 69   Temp 98.2 F (36.8 C) (Oral)   Resp 16   Ht 5\' 4"  (1.626 m)   Wt (!) 364 lb (165.1 kg)   SpO2 95%   BMI 62.48 kg/m   Wt Readings from Last 3 Encounters:  09/05/18 (!) 364 lb (165.1 kg)  09/01/18 (!) 368 lb (166.9 kg)  08/30/18 (!) 366 lb (166 kg)     Physical Exam Vitals  signs and nursing note reviewed.  Constitutional:      General: She is not in acute distress.    Appearance: She is well-developed.  HENT:     Head: Normocephalic and atraumatic.     Right Ear: Tympanic membrane normal.     Left Ear: Tympanic membrane normal.  Eyes:     Extraocular Movements: Extraocular movements intact.     Conjunctiva/sclera: Conjunctivae normal.     Pupils: Pupils are equal, round, and reactive to light.  Neck:     Musculoskeletal: Normal range of motion.  Cardiovascular:     Rate and Rhythm: Normal rate and regular rhythm.     Heart sounds: Normal heart sounds.  Pulmonary:     Effort: Pulmonary effort is normal. No respiratory distress.     Breath sounds: Wheezing (throughout all lung fields expirtory and inspiratory) present.     Comments: Decreased at the lower bilateral lung fields.  Abdominal:     General: Bowel sounds are normal. There is no distension.     Palpations: Abdomen is soft.  Musculoskeletal: Normal range of motion.  Skin:    General: Skin is warm and dry.     Findings: Erythema present.     Comments: Bilateral lower extremities warm to touch. TTP, pitting edema with hyperpimentation and discoloration. Malodorous.   Neurological:     Mental Status: She is alert and oriented to person, place, and time.     Gait: Gait abnormal (ambulates  with cane. ).  Psychiatric:        Mood and Affect: Mood normal.        Behavior: Behavior normal.        Thought Content: Thought content normal.        Judgment: Judgment normal.     ASSESSMENT/PLAN:  1. Follow-up exam Patient to continue with increased lasix x 5 days and return to normal dose thereafter. Will continue to monitor potassium levels at next visit.   2. Chronic diastolic CHF (congestive heart failure) ([place]) Will refer for nutrition education due to mulitple co-morbidities including obesity.  - Amb ref to Medical Nutrition Therapy-MNT  3. Cellulitis of lower extremity, unspecified  laterality Lower extremities continue to be erythematous. Will start Augmentin for cellulitis. Will continue to monitor.  - amoxicillin-clavulanate (AUGMENTIN) 875-125 MG tablet; Take 1 tablet by mouth 2 (two) times daily.  Dispense: 20 tablet; Refill: 0  4. Hypokalemia Refills placed. Will continue to monitor.  - potassium chloride (K-DUR) 10 MEQ tablet; Take 1 tablet (10 mEq total) by mouth daily.  Dispense: 90 tablet; Refill: 1  5. Severe obesity (BMI >= 40) ([facility]) Will refer for nutrition education due to mulitple co-morbidities including obesity.   - Amb ref to Medical Nutrition Therapy-MNT  Patient has not picked up respiratory medications from pharmacy.   No follow-ups on file.    The patient was given clear instructions to go to ER or return to medical center if symptoms do not improve, worsen or new problems develop. The patient verbalized understanding and agreed with plan of care.   Ms. [name], FNP-BC Patient [facility] [address] [place], [place] [address] [phone]

## 2018-09-05 NOTE — Patient Instructions (Signed)
Please make sure you are monitoring you weight every day.    Living With Heart Failure  Heart failure is a long-term (chronic) condition in which the heart cannot pump enough blood through the body. When this happens, parts of the body do not get the blood and oxygen they need. There is no cure for heart failure at this time, so it is important for you to take good care of yourself and follow the treatment plan set by your health care provider. If you are living with heart failure, there are ways to help you manage the disease. Follow these instructions at home: Living with heart failure requires you to make changes in your life. Your health care team will teach you about the changes you need to make in order to relieve your symptoms and lower your risk of going to the hospital. Follow the treatment plan as set by your health care provider. Medicines Medicines are important in reducing your heart's workload, slowing the progression of heart failure, and improving your symptoms.  Take over-the-counter and prescription medicines only as told by your health care provider.  Do not stop taking your medicine unless your health care provider tells you to do that.  Do not skip any dose of your medicine.  Refill prescriptions before you run out of medicine. You need your medicines every day. Eating and drinking   Eat heart-healthy foods. Talk with a dietitian to make an eating plan that is right for you. ? If directed by your health care provider: ? Limit salt (sodium). Lowering your sodium intake may reduce symptoms of heart failure. Ask a dietitian to recommend heart-healthy seasonings. ? Limit your fluid intake. Fluid restriction may reduce symptoms of heart failure. ? Use low-fat cooking methods instead of frying. Low-fat methods include roasting, grilling, broiling, baking, poaching, steaming, and stir-frying. ? Choose foods that contain no trans fat and are low in saturated fat and  cholesterol. Healthy choices include fresh or frozen fruits and vegetables, fish, lean meats, legumes, fat-free or low-fat dairy products, and whole-grain or high-fiber foods.  Limit alcohol intake to no more than 1 drink a day for nonpregnant women and 2 drinks a day for men. One drink equals 12 oz of beer, 5 oz of wine, or 1 oz of hard liquor. ? Drinking more than that is harmful to your heart. Tell your health care provider if you drink alcohol several times a week. ? Talk with your health care provider about whether any level of alcohol use is safe for you. Activity   Ask your health care provider about attending cardiac rehabilitation. These programs include aerobic physical activity, which provides many benefits for your heart.  If no cardiac rehabilitation program is available, ask your health care provider what aerobic exercises are safe for you to do. Lifestyle Make the lifestyle changes recommended by your health care provider. In general:  Lose weight if your health care provider tells you to do that. Weight loss may reduce symptoms of heart failure.  Do not use any products that contain nicotine or tobacco, such as cigarettes or e-cigarettes. If you need help quitting, ask your health care provider.  Do not use street (illegal) drugs.  Return to your normal activities as told by your health care provider. Ask your health care provider what activities are safe for you. General instructions   Make sure you weigh yourself every day to track your weight. Rapid weight gain may indicate an increase in fluid in your body  and may increase the workload of your heart. ? Weigh yourself every morning. Do this after you urinate but before you eat breakfast. ? Wear the same type of clothing, without shoes, each time you weigh yourself. ? Weigh yourself on the same scale and in the same spot each time.  Living with chronic heart failure often leads to emotions such as fear, stress,  anxiety, and depression. If you feel any of these emotions and need help coping, contact your health care provider. Other ways to get help include: ? Talking to friends and family members about your condition. They can give you support and guidance. Explain your symptoms to them and, if comfortable, invite them to attend appointments or rehabilitation with you. ? Joining a support group for people with chronic heart failure. Talking with other people who have the same symptoms may give you new ways of coping with your disease and your emotions.  Stay up to date with your shots (vaccines). Staying current on pneumococcal and influenza vaccines is especially important in preventing germs from attacking your airways (respiratory infections).  Keep all follow-up visits as told by your health care provider. This is important. How to recognize changes in your condition You and your family members need to know what changes to watch for in your condition. Watch for the following changes and report them to your health care provider:  Sudden weight gain. Ask your health care provider what amount of weight gain to report.  Shortness of breath: ? Feeling short of breath while at rest, with no exercise or activity that required great effort. ? Feeling breathless with activity.  Swelling of your lower legs or ankles.  Difficulty sleeping: ? You wake up feeling short of breath. ? You have to use more pillows to raise your head in order to sleep.  Frequent, dry, hacking cough.  Loss of appetite.  Feeling more tired all the time.  Depression or feelings of sadness or hopelessness.  Bloating in the stomach. Where to find more information  Local support groups. Ask your health care provider about groups near you.  The American Heart Association: www.heart.org Contact a health care provider if:  You have a rapid weight gain.  You have increasing shortness of breath that is unusual for  you.  You are unable to participate in your usual physical activities.  You tire easily.  You cough more than normal, especially with physical activity.  You have any swelling or more swelling in areas such as your hands, feet, ankles, or abdomen.  You feel like your heart is beating quickly (palpitations).  You become dizzy or light-headed when you stand up. Get help right away if:  You have difficulty breathing.  You notice or your family notices a change in your awareness, such as having trouble staying awake or having difficulty with concentration.  You have pain or discomfort in your chest.  You have an episode of fainting (syncope). Summary  There is no cure for heart failure, so it is important for you to take good care of yourself and follow the treatment plan set by your health care provider.  Medicines are important in reducing your heart's workload, slowing the progression of heart failure, and improving your symptoms.  Living with chronic heart failure often leads to emotions such as fear, stress, anxiety, and depression. If you are feeling any of these emotions and need help coping, contact your health care provider. This information is not intended to replace advice given to  you by your health care provider. Make sure you discuss any questions you have with your health care provider. Document Released: 11/18/2016 Document Revised: 11/18/2016 Document Reviewed: 11/18/2016 Elsevier Interactive Patient Education  2019 [employer].

## 2018-09-06 ENCOUNTER — Inpatient Hospital Stay (HOSPITAL_COMMUNITY): Payer: Medicare Other

## 2018-09-06 ENCOUNTER — Emergency Department (HOSPITAL_COMMUNITY): Payer: Medicare Other

## 2018-09-06 ENCOUNTER — Inpatient Hospital Stay (HOSPITAL_COMMUNITY)
Admission: EM | Admit: 2018-09-06 | Discharge: 2018-09-09 | DRG: 041 | Disposition: A | Discharge status: Home Health | Payer: Medicare Other | Attending: Internal Medicine | Admitting: Internal Medicine

## 2018-09-06 ENCOUNTER — Observation Stay (HOSPITAL_COMMUNITY): Payer: Medicare Other

## 2018-09-06 DIAGNOSIS — R531 Weakness: Secondary | ICD-10-CM | POA: Diagnosis not present

## 2018-09-06 DIAGNOSIS — R55 Syncope and collapse: Secondary | ICD-10-CM | POA: Diagnosis not present

## 2018-09-06 DIAGNOSIS — Z7951 Long term (current) use of inhaled steroids: Secondary | ICD-10-CM | POA: Diagnosis not present

## 2018-09-06 DIAGNOSIS — N183 Chronic kidney disease, stage 3 (moderate): Secondary | ICD-10-CM | POA: Diagnosis not present

## 2018-09-06 DIAGNOSIS — Z882 Allergy status to sulfonamides status: Secondary | ICD-10-CM

## 2018-09-06 DIAGNOSIS — Z833 Family history of diabetes mellitus: Secondary | ICD-10-CM

## 2018-09-06 DIAGNOSIS — R062 Wheezing: Secondary | ICD-10-CM | POA: Diagnosis not present

## 2018-09-06 DIAGNOSIS — G4733 Obstructive sleep apnea (adult) (pediatric): Secondary | ICD-10-CM | POA: Diagnosis present

## 2018-09-06 DIAGNOSIS — Y92002 Bathroom of unspecified non-institutional (private) residence single-family (private) house as the place of occurrence of the external cause: Secondary | ICD-10-CM

## 2018-09-06 DIAGNOSIS — D573 Sickle-cell trait: Secondary | ICD-10-CM | POA: Diagnosis present

## 2018-09-06 DIAGNOSIS — Z841 Family history of disorders of kidney and ureter: Secondary | ICD-10-CM

## 2018-09-06 DIAGNOSIS — D509 Iron deficiency anemia, unspecified: Secondary | ICD-10-CM | POA: Diagnosis present

## 2018-09-06 DIAGNOSIS — I639 Cerebral infarction, unspecified: Secondary | ICD-10-CM

## 2018-09-06 DIAGNOSIS — Z9981 Dependence on supplemental oxygen: Secondary | ICD-10-CM | POA: Diagnosis not present

## 2018-09-06 DIAGNOSIS — E876 Hypokalemia: Secondary | ICD-10-CM | POA: Diagnosis present

## 2018-09-06 DIAGNOSIS — S060X9A Concussion with loss of consciousness of unspecified duration, initial encounter: Secondary | ICD-10-CM | POA: Diagnosis present

## 2018-09-06 DIAGNOSIS — R4182 Altered mental status, unspecified: Secondary | ICD-10-CM | POA: Diagnosis not present

## 2018-09-06 DIAGNOSIS — I130 Hypertensive heart and chronic kidney disease with heart failure and stage 1 through stage 4 chronic kidney disease, or unspecified chronic kidney disease: Secondary | ICD-10-CM | POA: Diagnosis not present

## 2018-09-06 DIAGNOSIS — I10 Essential (primary) hypertension: Secondary | ICD-10-CM | POA: Diagnosis not present

## 2018-09-06 DIAGNOSIS — Z79899 Other long term (current) drug therapy: Secondary | ICD-10-CM

## 2018-09-06 DIAGNOSIS — S199XXA Unspecified injury of neck, initial encounter: Secondary | ICD-10-CM | POA: Diagnosis not present

## 2018-09-06 DIAGNOSIS — I6389 Other cerebral infarction: Secondary | ICD-10-CM | POA: Diagnosis not present

## 2018-09-06 DIAGNOSIS — Q211 Atrial septal defect: Secondary | ICD-10-CM

## 2018-09-06 DIAGNOSIS — R29704 NIHSS score 4: Secondary | ICD-10-CM | POA: Diagnosis present

## 2018-09-06 DIAGNOSIS — S060XAA Concussion with loss of consciousness status unknown, initial encounter: Secondary | ICD-10-CM | POA: Diagnosis present

## 2018-09-06 DIAGNOSIS — Z9851 Tubal ligation status: Secondary | ICD-10-CM | POA: Diagnosis not present

## 2018-09-06 DIAGNOSIS — I509 Heart failure, unspecified: Secondary | ICD-10-CM | POA: Diagnosis not present

## 2018-09-06 DIAGNOSIS — R404 Transient alteration of awareness: Secondary | ICD-10-CM | POA: Diagnosis not present

## 2018-09-06 DIAGNOSIS — Z6841 Body Mass Index (BMI) 40.0 and over, adult: Secondary | ICD-10-CM

## 2018-09-06 DIAGNOSIS — R7303 Prediabetes: Secondary | ICD-10-CM | POA: Diagnosis not present

## 2018-09-06 DIAGNOSIS — R609 Edema, unspecified: Secondary | ICD-10-CM | POA: Diagnosis not present

## 2018-09-06 DIAGNOSIS — J449 Chronic obstructive pulmonary disease, unspecified: Secondary | ICD-10-CM | POA: Diagnosis present

## 2018-09-06 DIAGNOSIS — K5900 Constipation, unspecified: Secondary | ICD-10-CM

## 2018-09-06 DIAGNOSIS — Z7982 Long term (current) use of aspirin: Secondary | ICD-10-CM | POA: Diagnosis not present

## 2018-09-06 DIAGNOSIS — W1830XA Fall on same level, unspecified, initial encounter: Secondary | ICD-10-CM | POA: Diagnosis present

## 2018-09-06 DIAGNOSIS — Q250 Patent ductus arteriosus: Secondary | ICD-10-CM | POA: Diagnosis not present

## 2018-09-06 DIAGNOSIS — I5032 Chronic diastolic (congestive) heart failure: Secondary | ICD-10-CM | POA: Diagnosis present

## 2018-09-06 DIAGNOSIS — M109 Gout, unspecified: Secondary | ICD-10-CM | POA: Diagnosis present

## 2018-09-06 DIAGNOSIS — I6340 Cerebral infarction due to embolism of unspecified cerebral artery: Secondary | ICD-10-CM | POA: Diagnosis not present

## 2018-09-06 DIAGNOSIS — S8001XA Contusion of right knee, initial encounter: Secondary | ICD-10-CM | POA: Diagnosis not present

## 2018-09-06 DIAGNOSIS — M1990 Unspecified osteoarthritis, unspecified site: Secondary | ICD-10-CM | POA: Diagnosis present

## 2018-09-06 DIAGNOSIS — D869 Sarcoidosis, unspecified: Secondary | ICD-10-CM | POA: Diagnosis present

## 2018-09-06 DIAGNOSIS — S0003XA Contusion of scalp, initial encounter: Secondary | ICD-10-CM | POA: Diagnosis not present

## 2018-09-06 DIAGNOSIS — I110 Hypertensive heart disease with heart failure: Secondary | ICD-10-CM | POA: Diagnosis not present

## 2018-09-06 DIAGNOSIS — Z9119 Patient's noncompliance with other medical treatment and regimen: Secondary | ICD-10-CM | POA: Diagnosis not present

## 2018-09-06 DIAGNOSIS — Q600 Renal agenesis, unilateral: Secondary | ICD-10-CM | POA: Diagnosis not present

## 2018-09-06 DIAGNOSIS — R918 Other nonspecific abnormal finding of lung field: Secondary | ICD-10-CM | POA: Diagnosis not present

## 2018-09-06 DIAGNOSIS — Z8049 Family history of malignant neoplasm of other genital organs: Secondary | ICD-10-CM

## 2018-09-06 DIAGNOSIS — Z8249 Family history of ischemic heart disease and other diseases of the circulatory system: Secondary | ICD-10-CM

## 2018-09-06 DIAGNOSIS — Z807 Family history of other malignant neoplasms of lymphoid, hematopoietic and related tissues: Secondary | ICD-10-CM

## 2018-09-06 DIAGNOSIS — Z85118 Personal history of other malignant neoplasm of bronchus and lung: Secondary | ICD-10-CM

## 2018-09-06 LAB — COMPREHENSIVE METABOLIC PANEL
ALT: 13 U/L (ref 0–44)
AST: 22 U/L (ref 15–41)
Albumin: 3.1 g/dL — ABNORMAL LOW (ref 3.5–5.0)
Alkaline Phosphatase: 99 U/L (ref 38–126)
Anion gap: 12 (ref 5–15)
BUN: 25 mg/dL — ABNORMAL HIGH (ref 8–23)
CO2: 27 mmol/L (ref 22–32)
Calcium: 8.4 mg/dL — ABNORMAL LOW (ref 8.9–10.3)
Chloride: 103 mmol/L (ref 98–111)
Creatinine, Ser: 1.97 mg/dL — ABNORMAL HIGH (ref 0.44–1.00)
GFR calc Af Amer: 30 mL/min — ABNORMAL LOW (ref >60)
GFR calc non Af Amer: 26 mL/min — ABNORMAL LOW (ref >60)
Glucose, Bld: 112 mg/dL — ABNORMAL HIGH (ref 70–99)
Potassium: 3.3 mmol/L — ABNORMAL LOW (ref 3.5–5.1)
Sodium: 142 mmol/L (ref 135–145)
Total Bilirubin: 0.4 mg/dL (ref 0.3–1.2)
Total Protein: 6.3 g/dL — ABNORMAL LOW (ref 6.5–8.1)

## 2018-09-06 LAB — ECHOCARDIOGRAM COMPLETE
HEIGHTINCHES: 64 in
Weight: 5823.99 oz

## 2018-09-06 LAB — POCT I-STAT 7, (LYTES, BLD GAS, ICA,H+H)
Acid-Base Excess: 6 mmol/L — ABNORMAL HIGH (ref 0.0–2.0)
Bicarbonate: 31.3 mmol/L — ABNORMAL HIGH (ref 20.0–28.0)
Calcium, Ion: 1.13 mmol/L — ABNORMAL LOW (ref 1.15–1.40)
HCT: 32 % — ABNORMAL LOW (ref 36.0–46.0)
Hemoglobin: 10.9 g/dL — ABNORMAL LOW (ref 12.0–15.0)
O2 Saturation: 94 %
Patient temperature: 98
Potassium: 4 mmol/L (ref 3.5–5.1)
Sodium: 140 mmol/L (ref 135–145)
TCO2: 33 mmol/L — ABNORMAL HIGH (ref 22–32)
pCO2 arterial: 48 mmHg (ref 32.0–48.0)
pH, Arterial: 7.421 (ref 7.350–7.450)
pO2, Arterial: 69 mmHg — ABNORMAL LOW (ref 83.0–108.0)

## 2018-09-06 LAB — PHOSPHORUS: Phosphorus: 4.1 mg/dL (ref 2.5–4.6)

## 2018-09-06 LAB — PROTIME-INR
INR: 0.97
Prothrombin Time: 12.8 seconds (ref 11.4–15.2)

## 2018-09-06 LAB — RAPID URINE DRUG SCREEN, HOSP PERFORMED
Amphetamines: NOT DETECTED
Barbiturates: NOT DETECTED
Benzodiazepines: NOT DETECTED
Cocaine: NOT DETECTED
Opiates: NOT DETECTED
Tetrahydrocannabinol: NOT DETECTED

## 2018-09-06 LAB — CBC WITH DIFFERENTIAL/PLATELET
ABS IMMATURE GRANULOCYTES: 0.02 10*3/uL (ref 0.00–0.07)
BASOS PCT: 1 %
Basophils Absolute: 0 10*3/uL (ref 0.0–0.1)
Eosinophils Absolute: 0.6 10*3/uL — ABNORMAL HIGH (ref 0.0–0.5)
Eosinophils Relative: 13 %
HCT: 38.4 % (ref 36.0–46.0)
Hemoglobin: 11.2 g/dL — ABNORMAL LOW (ref 12.0–15.0)
Immature Granulocytes: 0 %
Lymphocytes Relative: 32 %
Lymphs Abs: 1.4 10*3/uL (ref 0.7–4.0)
MCH: 21.2 pg — ABNORMAL LOW (ref 26.0–34.0)
MCHC: 29.2 g/dL — ABNORMAL LOW (ref 30.0–36.0)
MCV: 72.7 fL — ABNORMAL LOW (ref 80.0–100.0)
Monocytes Absolute: 0.4 10*3/uL (ref 0.1–1.0)
Monocytes Relative: 8 %
Neutro Abs: 2.1 10*3/uL (ref 1.7–7.7)
Neutrophils Relative %: 46 %
PLATELETS: 172 10*3/uL (ref 150–400)
RBC: 5.28 MIL/uL — ABNORMAL HIGH (ref 3.87–5.11)
RDW: 15.2 % (ref 11.5–15.5)
WBC: 4.5 10*3/uL (ref 4.0–10.5)
nRBC: 0 % (ref 0.0–0.2)

## 2018-09-06 LAB — MAGNESIUM: Magnesium: 1.6 mg/dL — ABNORMAL LOW (ref 1.7–2.4)

## 2018-09-06 LAB — URINALYSIS, ROUTINE W REFLEX MICROSCOPIC
Bilirubin Urine: NEGATIVE
GLUCOSE, UA: NEGATIVE mg/dL
Hgb urine dipstick: NEGATIVE
Ketones, ur: NEGATIVE mg/dL
LEUKOCYTE UA: NEGATIVE
Nitrite: NEGATIVE
Protein, ur: NEGATIVE mg/dL
Specific Gravity, Urine: 1.013 (ref 1.005–1.030)
pH: 5 (ref 5.0–8.0)

## 2018-09-06 LAB — ETHANOL: Alcohol, Ethyl (B): <10 mg/dL (ref <10)

## 2018-09-06 LAB — LACTIC ACID, PLASMA: Lactic Acid, Venous: 1.4 mmol/L (ref 0.5–1.9)

## 2018-09-06 LAB — I-STAT TROPONIN, ED: TROPONIN I, POC: 0 ng/mL (ref 0.00–0.08)

## 2018-09-06 LAB — CBG MONITORING, ED: Glucose-Capillary: 102 mg/dL — ABNORMAL HIGH (ref 70–99)

## 2018-09-06 LAB — APTT: aPTT: 27 seconds (ref 24–36)

## 2018-09-06 LAB — POTASSIUM: Potassium: 3.2 mmol/L — ABNORMAL LOW (ref 3.5–5.2)

## 2018-09-06 LAB — AMMONIA: Ammonia: 21 umol/L (ref 9–35)

## 2018-09-06 MED ORDER — ONDANSETRON HCL 4 MG/2ML IJ SOLN: 4.0000 mg | Freq: Four times a day (QID) | INTRAMUSCULAR | Status: DC | PRN

## 2018-09-06 MED ORDER — POTASSIUM CHLORIDE IN NACL 40-0.9 MEQ/L-% IV SOLN
INTRAVENOUS | Status: AC
  Administered 2018-09-06 (×2): 72 mL/h via INTRAVENOUS
  Filled 2018-09-06 (×2): qty 1000

## 2018-09-06 MED ORDER — MAGNESIUM SULFATE 2 GM/50ML IV SOLN
2.0000 g | Freq: Once | INTRAVENOUS | Status: AC
  Administered 2018-09-06: 2 g via INTRAVENOUS
  Filled 2018-09-06: qty 50

## 2018-09-06 MED ORDER — ONDANSETRON HCL 4 MG PO TABS: 4.0000 mg | ORAL_TABLET | Freq: Four times a day (QID) | ORAL | Status: DC | PRN

## 2018-09-06 MED ORDER — ENOXAPARIN SODIUM 40 MG/0.4ML ~~LOC~~ SOLN
40.0000 mg | SUBCUTANEOUS | Status: DC
  Administered 2018-09-06 – 2018-09-09 (×4): 40 mg via SUBCUTANEOUS
  Filled 2018-09-06 (×4): qty 0.4

## 2018-09-06 MED ORDER — ACETAMINOPHEN 160 MG/5ML PO SOLN: 650.0000 mg | ORAL | Status: DC | PRN

## 2018-09-06 MED ORDER — STROKE: EARLY STAGES OF RECOVERY BOOK
Freq: Once | Status: AC
  Administered 2018-09-06: 18:00:00
  Filled 2018-09-06: qty 1

## 2018-09-06 MED ORDER — NALOXONE HCL 2 MG/2ML IJ SOSY
PREFILLED_SYRINGE | INTRAMUSCULAR | Status: AC
  Administered 2018-09-06: 07:00:00
  Filled 2018-09-06: qty 2

## 2018-09-06 MED ORDER — POTASSIUM CHLORIDE 10 MEQ/100ML IV SOLN
10.0000 meq | INTRAVENOUS | Status: AC
  Filled 2018-09-06: qty 100

## 2018-09-06 MED ORDER — ASPIRIN 300 MG RE SUPP: 300.0000 mg | Freq: Every day | RECTAL | Status: DC

## 2018-09-06 MED ORDER — ACETAMINOPHEN 325 MG PO TABS
650.0000 mg | ORAL_TABLET | ORAL | Status: DC | PRN
  Administered 2018-09-07: 650 mg via ORAL
  Filled 2018-09-06: qty 2

## 2018-09-06 MED ORDER — POTASSIUM CHLORIDE IN NACL 40-0.9 MEQ/L-% IV SOLN: INTRAVENOUS | Status: DC

## 2018-09-06 MED ORDER — ACETAMINOPHEN 650 MG RE SUPP: 650.0000 mg | RECTAL | Status: DC | PRN

## 2018-09-06 MED ORDER — BUDESONIDE 0.5 MG/2ML IN SUSP
0.5000 mg | Freq: Two times a day (BID) | RESPIRATORY_TRACT | Status: DC
  Administered 2018-09-06 – 2018-09-09 (×6): 0.5 mg via RESPIRATORY_TRACT
  Filled 2018-09-06 (×6): qty 2

## 2018-09-06 MED ORDER — ASPIRIN 325 MG PO TABS
325.0000 mg | ORAL_TABLET | Freq: Every day | ORAL | Status: DC
  Administered 2018-09-06 – 2018-09-09 (×3): 325 mg via ORAL
  Filled 2018-09-06 (×4): qty 1

## 2018-09-06 MED ORDER — IPRATROPIUM-ALBUTEROL 0.5-2.5 (3) MG/3ML IN SOLN
3.0000 mL | Freq: Four times a day (QID) | RESPIRATORY_TRACT | Status: DC
  Administered 2018-09-06 – 2018-09-07 (×2): 3 mL via RESPIRATORY_TRACT
  Filled 2018-09-06 (×2): qty 3

## 2018-09-06 MED ORDER — MORPHINE SULFATE (PF) 4 MG/ML IV SOLN
4.0000 mg | INTRAVENOUS | Status: DC | PRN
  Administered 2018-09-06 – 2018-09-09 (×2): 4 mg via INTRAVENOUS
  Filled 2018-09-06 (×2): qty 1

## 2018-09-06 MED ORDER — ENOXAPARIN SODIUM 40 MG/0.4ML ~~LOC~~ SOLN: 40.0000 mg | SUBCUTANEOUS | Status: DC

## 2018-09-06 NOTE — ED Notes (Signed)
EKG done, not exporting. EDP has reviewed hard copy.

## 2018-09-06 NOTE — ED Notes (Signed)
Pt transported to venous US

## 2018-09-06 NOTE — H&P (Signed)
History and Physical    Pamela [name] IPJ:[id number] DOB: 17-Mar-1951 DOA: 09/06/2018  PCP: Lanae Boast, FNP   Patient coming from: Home.  I have personally briefly reviewed patient's old medical records in [place]  Chief Complaint: Altered mental status.  HPI: Pamela [name] is a 68 y.o. female with medical history significant of osteoarthritis, asthma, lung cancer, COPD, gout, hypertension, history of right upper lobe lung adenocarcinoma, right chest wall mass, sarcoidosis, sickle cell trait, sleep apnea who was brought to the emergency department after being found on the floor altered with a hematoma on her left forehead and epistaxis from her left nostril.  The patient was initially alter and unable to provide further information.  However, the patient was more alert when I initially saw her.  She states she remembers falling and feeling groggy after the fall, but does not remember previous symptomatology prior to this.  She denies fever, chills, sore throat or rhinorrhea.  No cough, recent wheezing or hemoptysis.  She denies chest pain, palpitations, dizziness, diaphoresis, PND or orthopnea.  She gets frequent lower extremity edema.  She denies abdominal pain, nausea, emesis, diarrhea, constipation, melena or hematochezia.  No dysuria, frequency or hematuria.  Denies polyuria, polydipsia, polyphagia or blurred vision.  ED Course: Initial vital signs pulse 64, respirations 15, blood pressure 136/84 mmHg and O2 sat 99% on room air.  ABG was pH 7.42, PCO2 48.0 PaO2 was 69.0 mmHg, bicarb was 31.3 and T CO2 33 and acid-base is 6.0 mmol/L.  O2 sat 94%.[name] Kitchen  UDS was negative.  Urinalysis was normal.  Her white count is 4.5, hemoglobin 11.2 g/dL and platelets 172.  PT/INR and PTT were within normal limits.  Ammonia was 21 mol/L.  Alcohol was less than 10, magnesium was 1.6 and phosphorus 4.1 mg/dL.  Potassium 3.3 mmol/L, all other electrolytes are within normal limits when calcium is  corrected to albumin.  Calcium 8.4, glucose 112, BUN 25 and creatinine 1.97 mg/dL.  Total protein was 6.3 and albumin 3.1 g/dL.  Transaminases and bilirubin level were normal.  Imaging: chest radiograph showed low volume without acute findings.  Right knee x-ray showed soft tissue swelling, but no acute fracture.  C-spine CT did not show any acute injury.  CT head shows large left frontal area scalp hematoma.  MRI of brain again demonstrated the skull hematoma, but also shows acute infarction within both the right and left thalami.  Please see images and full radiology report for further detail.  Review of Systems: As per HPI otherwise 10 point review of systems negative.   Past Medical History:  Diagnosis Date  . Arthritis   . Asthma   . Cancer ([place])    lung, adenocarcinoma right lung 2012  . CHF (congestive heart failure) (H[facility])    Preserved EF  . Congenital single kidney    With chronic kidney disease  . COPD (chronic obstructive pulmonary disease) ([place])   . Gout   . Hypertension   . Lung cancer [facility]) 2012   Right upper lobe lung adenocarcinoma diagnosed with needle biopsy treated by SBRT finished treatment April 2013 has been monitored since  . Mass of chest wall, right    Right chest wall mass 7.3 cm biopsy on 12/13/2013. Patient notes it was consistent with sarcoidosis but actual pathology results not available.  . Oxygen deficiency   . Sarcoidosis   . Sickle cell trait ([place])   . Sleep apnea  Past Surgical History:  Procedure Laterality Date  . LUNG BIOPSY    . TUBAL LIGATION    . VEIN LIGATION AND STRIPPING       reports that she has never smoked. She has never used smokeless tobacco. She reports that she does not drink alcohol or use drugs.  Allergies  Allergen Reactions  . Sulfur Rash    Family History  Problem Relation Age of Onset  . Hypertension Mother   . Renal Disease Mother   . Cancer Father        stomach  . Heart disease Father        No details  .  Cancer Brother   . Diabetes Brother   . Cervical cancer Sister   . Diabetes Sister   . Multiple myeloma Sister    Prior to Admission medications   Medication Sig Start Date End Date Taking? Authorizing Provider  acetaminophen (TYLENOL) 500 MG tablet Take 1 tablet (500 mg total) by mouth every 6 (six) hours as needed. Patient taking differently: Take 1,000 mg by mouth daily as needed for mild pain or headache.  04/16/15  Yes Dorena Dew, FNP  albuterol (PROAIR HFA) 108 (90 Base) MCG/ACT inhaler Inhale 2 puffs into the lungs every 6 (six) hours as needed for wheezing or shortness of breath. 04/28/16  Yes Parrett, [name] S, NP  albuterol (PROVENTIL) (2.5 MG/3ML) 0.083% nebulizer solution Take 3 mLs (2.5 mg total) by nebulization every 6 (six) hours as needed for wheezing or shortness of breath. 12/10/17  Yes Dorena Dew, FNP  aspirin 81 MG tablet Take 1 tablet (81 mg total) by mouth daily. 01/02/16  Yes Dorena Dew, FNP  calcitRIOL (ROCALTROL) 0.25 MCG capsule Take 0.25 mcg by mouth daily.   Yes [provider]  docusate sodium (COLACE) 100 MG capsule Take 1 capsule (100 mg total) by mouth daily. Patient taking differently: Take 100 mg by mouth 2 (two) times daily.  01/02/16  Yes Dorena Dew, FNP  febuxostat (ULORIC) 40 MG tablet TAKE 1 TABLET (40 MG TOTAL) BY MOUTH DAILY. Patient taking differently: Take 40 mg by mouth daily.  08/17/18  Yes Lanae Boast, FNP  furosemide (LASIX) 40 MG tablet Take 1 tablet (40 mg total) by mouth 2 (two) times daily. Patient taking differently: Take 80-120 mg by mouth See admin instructions. Take 120 in the morning and 40 mg in the evening 01/19/18  Yes Dorena Dew, FNP  potassium chloride (K-DUR) 10 MEQ tablet Take 1 tablet (10 mEq total) by mouth daily. 09/05/18  Yes Lanae Boast, FNP  SYMBICORT 160-4.5 MCG/ACT inhaler INHALE 2 PUFFS BY MOUTH TWICE DAILY Patient taking differently: Inhale 2 puffs into the lungs 2 (two) times  daily.  02/23/18  Yes Tanda Rockers, MD  amoxicillin-clavulanate (AUGMENTIN) 875-125 MG tablet Take 1 tablet by mouth 2 (two) times daily. 09/05/18   Lanae Boast, FNP  montelukast (SINGULAIR) 10 MG tablet Take 1 tablet (10 mg total) by mouth at bedtime. 09/01/18   Tanda Rockers, MD    Physical Exam: Vitals:   09/06/18 0534 09/06/18 0535 09/06/18 0545 09/06/18 0800  BP:  136/84 (!) 114/91 112/72  Pulse:  64 64 (!) 59  Resp:   15   TempSrc:  Rectal    SpO2:  99% 96% 98%  Weight: (!) 165.1 kg     Height: _0  (1.626 m)       Constitutional: NAD, calm, comfortable Eyes: PERRL, lids and conjunctivae normal  ENMT: Dry blood on left nostril.  Mucous membranes are moist. Posterior pharynx clear of any exudate or lesions. Neck: normal, supple, no masses, no thyromegaly Respiratory: clear to auscultation bilaterally, no wheezing, no crackles. Normal respiratory effort. No accessory muscle use.  Cardiovascular: Regular rate and rhythm, no murmurs / rubs / gallops.  1+ lower extremities pitting edema.  Stage III lymphedema.  2+ pedal pulses. No carotid bruits.  Abdomen: no tenderness, no masses palpated. No hepatosplenomegaly. Bowel sounds positive.  Musculoskeletal: no clubbing / cyanosis.  Right knee edema and hematoma with moderate to severe decrease in right knee ROM, no contractures. Normal muscle tone.  Skin: Left frontal area hematoma. Neurologic: CN 2-12 grossly intact. Sensation intact, DTR normal.  Normal coordination with nose to finger.  No pronator drift.   Generalized weakness.  Unable to evaluate gait. Psychiatric: Normal judgment and insight. Alert and oriented x 3, almost oriented to date.  She today was a 7 pain of February instead of the 18.  She is seems to have normal mood.   Labs on Admission: I have personally reviewed following labs and imaging studies  CBC: Recent Labs  Lab 09/01/18 1250 09/06/18 0548 09/06/18 0556  WBC 4.6  --  4.5  NEUTROABS 2.2  --  2.1  HGB  12.0 10.9* 11.2*  HCT 39.9 32.0* 38.4  MCV 73.6*  --  72.7*  PLT 177  --  119   Basic Metabolic Panel: Recent Labs  Lab 09/01/18 1250 09/05/18 1127 09/06/18 0548 09/06/18 0556 09/06/18 0825  NA 140  --  140 142  --   K 4.3 3.2* 4.0 3.3*  --   CL 103  --   --  103  --   CO2 28  --   --  27  --   GLUCOSE 98  --   --  112*  --   BUN 23  --   --  25*  --   CREATININE 1.61*  --   --  1.97*  --   CALCIUM 8.7*  --   --  8.4*  --   MG  --   --   --   --  1.6*  PHOS  --   --   --   --  4.1   GFR: Estimated Creatinine Clearance: 44.5 mL/min (A) (by C-G formula based on SCr of 1.97 mg/dL (H)). Liver Function Tests: Recent Labs  Lab 09/01/18 1250 09/06/18 0556  AST 27 22  ALT 16 13  ALKPHOS 111 99  BILITOT 0.7 0.4  PROT 7.1 6.3*  ALBUMIN 3.5 3.1*   No results for input(s): LIPASE, AMYLASE in the last 168 hours. Recent Labs  Lab 09/06/18 0558  AMMONIA 21   Coagulation Profile: Recent Labs  Lab 09/06/18 0556  INR 0.97   Cardiac Enzymes: Recent Labs  Lab 09/01/18 1250  TROPONINI <0.03   BNP (last 3 results) No results for input(s): PROBNP in the last 8760 hours. HbA1C: No results for input(s): HGBA1C in the last 72 hours. CBG: Recent Labs  Lab 09/06/18 0537  GLUCAP 102*   Lipid Profile: No results for input(s): CHOL, HDL, LDLCALC, TRIG, CHOLHDL, LDLDIRECT in the last 72 hours. Thyroid Function Tests: No results for input(s): TSH, T4TOTAL, FREET4, T3FREE, THYROIDAB in the last 72 hours. Anemia Panel: No results for input(s): VITAMINB12, FOLATE, FERRITIN, TIBC, IRON, RETICCTPCT in the last 72 hours. Urine analysis:    Component Value Date/Time   COLORURINE YELLOW 09/06/2018 0616   APPEARANCEUR  CLEAR 09/06/2018 0616   LABSPEC 1.013 09/06/2018 0616   PHURINE 5.0 09/06/2018 0616   GLUCOSEU NEGATIVE 09/06/2018 0616   HGBUR NEGATIVE 09/06/2018 0616   BILIRUBINUR NEGATIVE 09/06/2018 0616   BILIRUBINUR neg 09/01/2018 1121   KETONESUR NEGATIVE 09/06/2018 0616    PROTEINUR NEGATIVE 09/06/2018 0616   UROBILINOGEN 0.2 09/01/2018 1121   UROBILINOGEN 0.2 09/24/2017 1143   NITRITE NEGATIVE 09/06/2018 0616   LEUKOCYTESUR NEGATIVE 09/06/2018 0616    Radiological Exams on Admission: Ct Head Wo Contrast  Result Date: 09/06/2018 CLINICAL DATA:  Posttraumatic altered mental state status EXAM: CT HEAD WITHOUT CONTRAST CT CERVICAL SPINE WITHOUT CONTRAST TECHNIQUE: Multidetector CT imaging of the head and cervical spine was performed following the standard protocol without intravenous contrast. Multiplanar CT image reconstructions of the cervical spine were also generated. COMPARISON:  Brain MRI 03/01/2018 FINDINGS: CT HEAD FINDINGS Brain: No evidence of acute infarction, hemorrhage, hydrocephalus, extra-axial collection or mass lesion/mass effect. Vascular: No hyperdense vessel or unexpected calcification. Skull: Large left frontal scalp hematoma. No underlying calvarial fracture. Sinuses/Orbits: Negative Other: Motion degraded such that repeat images were needed. CT CERVICAL SPINE FINDINGS Alignment: Normal Skull base and vertebrae: No acute fracture. No primary bone lesion or focal pathologic process. Soft tissues and spinal canal: No prevertebral fluid or swelling. No visible canal hematoma. Disc levels:  No significant degenerative change Upper chest: Negative. IMPRESSION: 1. No evidence of intracranial or cervical spine injury. 2. Scalp hematoma without calvarial fracture. Electronically Signed   By: Monte Fantasia M.D.   On: 09/06/2018 06:23   Ct Cervical Spine Wo Contrast  Result Date: 09/06/2018 CLINICAL DATA:  Posttraumatic altered mental state status EXAM: CT HEAD WITHOUT CONTRAST CT CERVICAL SPINE WITHOUT CONTRAST TECHNIQUE: Multidetector CT imaging of the head and cervical spine was performed following the standard protocol without intravenous contrast. Multiplanar CT image reconstructions of the cervical spine were also generated. COMPARISON:  Brain MRI  03/01/2018 FINDINGS: CT HEAD FINDINGS Brain: No evidence of acute infarction, hemorrhage, hydrocephalus, extra-axial collection or mass lesion/mass effect. Vascular: No hyperdense vessel or unexpected calcification. Skull: Large left frontal scalp hematoma. No underlying calvarial fracture. Sinuses/Orbits: Negative Other: Motion degraded such that repeat images were needed. CT CERVICAL SPINE FINDINGS Alignment: Normal Skull base and vertebrae: No acute fracture. No primary bone lesion or focal pathologic process. Soft tissues and spinal canal: No prevertebral fluid or swelling. No visible canal hematoma. Disc levels:  No significant degenerative change Upper chest: Negative. IMPRESSION: 1. No evidence of intracranial or cervical spine injury. 2. Scalp hematoma without calvarial fracture. Electronically Signed   By: Monte Fantasia M.D.   On: 09/06/2018 06:23   Mr Brain Wo Contrast  Result Date: 09/06/2018 CLINICAL DATA:  History of lung cancer and sarcoidosis. Altered mental status. Found on the floor. EXAM: MRI HEAD WITHOUT CONTRAST TECHNIQUE: Multiplanar, multiecho pulse sequences of the brain and surrounding structures were obtained without intravenous contrast. COMPARISON:  CT same day.  MRI 03/01/2018 FINDINGS: Brain: Diffusion imaging shows acute infarction in both thalami. Each region of infarction measures about a cm in size. The brainstem is otherwise normal. There are a few old small vessel cerebellar infarctions. Cerebral hemispheres otherwise show minimal small vessel change of the white matter. No cortical or large vessel territory infarction. No mass lesion, hemorrhage, hydrocephalus or extra-axial collection. Vascular: Major vessels at the base of the brain show flow. This includes flow being apparent in the vertebrobasilar system. Skull and upper cervical spine: Negative Sinuses/Orbits: Clear/normal Other: Left forehead and frontal  scalp hematoma. IMPRESSION: Acute infarction within both the  right and left thalami. 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 Left forehead and frontal scalp hematoma. Electronically Signed   By: Nelson Chimes M.D.   On: 09/06/2018 10:44   Dg Chest Portable 1 View  Result Date: 09/06/2018 CLINICAL DATA:  Altered mental status EXAM: PORTABLE CHEST 1 VIEW COMPARISON:  Five days ago FINDINGS: Low volume chest. There is no edema, consolidation, effusion, or pneumothorax. Linear atelectasis in the right mid lung. Borderline heart size accentuated by low volumes. IMPRESSION: Low volume chest without acute finding. Electronically Signed   By: Monte Fantasia M.D.   On: 09/06/2018 06:38   Dg Knee Right Port  Result Date: 09/06/2018 CLINICAL DATA:  Fall with right knee bruising EXAM: PORTABLE RIGHT KNEE - 1-2 VIEW COMPARISON:  None. FINDINGS: Anterior soft tissue swelling. Chronic fragmentation or articular bodies above the patella. There is tricompartmental degenerative spurring. No acute fracture or subluxation. IMPRESSION: Soft tissue swelling without acute fracture. Electronically Signed   By: Monte Fantasia M.D.   On: 09/06/2018 06:39    EKG: Independently reviewed.   Assessment/Plan Principal Problem:   Acute CVA (cerebrovascular accident) [facility]) Given location of CVA her prognosis is guarded. However, the patient's mental status has improved significantly. Admit to progressive unit/inpatient. Continue supplemental oxygen. Frequent neuro checks. PT/OT/SLP. Check carotid Doppler. Check echocardiogram. Daily aspirin. Neuro hospitalist team is on board.  Active Problems:   Closed head injury with concussion As above. Continue frequent neurochecks. Analgesics as needed.    Prediabetes Check hemoglobin A1c. Swallow screen. If SS passed, carbohydrate modified diet CBG monitoring regular insulin sliding scale.    Essential hypertension Hold  antihypertensives for now. Allow permissive hypertension up to 220/120 mmHg. Monitor blood pressure closely. PRN antihypertensive for SBP over 761 or systolic over 518 mmHg.    Morbid (severe) obesity due to excess calories ([place]) Will need significant lifestyle modifications.    Obstructive sleep apnea Not on CPAP.    Anemia, iron deficiency Monitor hematocrit and hemoglobin.    Hypokalemia Replacement ordered. Follow-up potassium level.    Hypomagnesemia Replaced.    DVT prophylaxis: SCDs due to forehead/right knee hematomas. Code Status: Full code. Family Communication: Her son was by bedside. Disposition Plan: Admit for CVA work-up. Consults called: Neuro hospitalist service. Admission status: Inpatient/stepdown.   Reubin Milan MD [facility]  09/06/2018, 12:12 PM   This document was prepared using Dragon voice recognition software and may contain some unintended transcription errors.

## 2018-09-06 NOTE — ED Notes (Signed)
Completed at 1230

## 2018-09-06 NOTE — ED Notes (Addendum)
Son bedside

## 2018-09-06 NOTE — Progress Notes (Signed)
New Admission Note:  Arrival Method: from ER Mental Orientation: aler & oriented x 4 Telemetry:M10 Assessment: Completed Skin: pt has skin tears and moisture damage under both breast, bruise on Left side of her forehead, and large bruise on R knee. IV: R forearm Pain:10/10 pain in R knee and forehead ; given PRN morphine Safety Measures: Safety Fall Prevention Plan was given, discussed. 2J03: Patient has been orientated to the room, unit and the staff. Family: son at bedside    Orders have been reviewed and implemented. Will continue to monitor the patient. Call light has been placed within reach and bed alarm has been activated.   Arta Silence ,RN

## 2018-09-06 NOTE — ED Notes (Signed)
Pt has purewick in place and hooked up to suction.

## 2018-09-06 NOTE — Consult Note (Signed)
Neurology Consultation  Reason for Consult: Altered mental status Referring Physician: Dr. Olevia Bowens  CC: Altered mental status  HPI: Pamela [name] is a 68 y.o. female with PMH of sleep apnea, sickle cell, sarcoidosis, lung cancer, hypertension, CHF and asthma. Pt stated that she woke up middle of the night and sat up in bed, she had sudden onset dizziness, blurry vision. She went to bathroom and fell. She cried out for help and then can not remember things. As per son, she was found face down on the floor this 5am, hit her left forehead and right knee. She was talking with significant slurred speech and stating she could not see however would not open her eyes. She was sent to ER for evaluation. In ER, pt stated that she was able to remember anything and she denies any weakness, numbness or tingling except right knee which is painful.   She recently had ER visit on 09/01/18 for BLE swelling, scheduled for LE venous doppler as outpt tomorrow. So far pt stated that her leg still swollen but no pain. CT no acute abnormality but MRI showed b/l thalamic infarct. Neurology consulted for stroke.   LSN: last night 10pm tPA given: no, outside window  ROS: A 14 point ROS was performed and is negative except as noted in the HPI.   Past Medical History:  Diagnosis Date  . Arthritis   . Asthma   . Cancer ([place])    lung, adenocarcinoma right lung 2012  . CHF (congestive heart failure) ([facility])    Preserved EF  . Congenital single kidney    With chronic kidney disease  . COPD (chronic obstructive pulmonary disease) ([place])   . Gout   . Hypertension   . Lung cancer [facility]) 2012   Right upper lobe lung adenocarcinoma diagnosed with needle biopsy treated by SBRT finished treatment April 2013 has been monitored since  . Mass of chest wall, right    Right chest wall mass 7.3 cm biopsy on 12/13/2013. Patient notes it was consistent with sarcoidosis but actual pathology results not available.  . Oxygen deficiency    . Sarcoidosis   . Sickle cell trait ([place])   . Sleep apnea      Family History  Problem Relation Age of Onset  . Hypertension Mother   . Renal Disease Mother   . Cancer Father        stomach  . Heart disease Father        No details  . Cancer Brother   . Diabetes Brother   . Cervical cancer Sister   . Diabetes Sister   . Multiple myeloma Sister     Social History:   reports that she has never smoked. She has never used smokeless tobacco. She reports that she does not drink alcohol or use drugs.  Medications No current facility-administered medications for this encounter.   Current Outpatient Medications:  .  acetaminophen (TYLENOL) 500 MG tablet, Take 1 tablet (500 mg total) by mouth every 6 (six) hours as needed. (Patient taking differently: Take 1,000 mg by mouth daily as needed for mild pain or headache. ), Disp: 30 tablet, Rfl: 2 .  albuterol (PROAIR HFA) 108 (90 Base) MCG/ACT inhaler, Inhale 2 puffs into the lungs every 6 (six) hours as needed for wheezing or shortness of breath., Disp: 1 Inhaler, Rfl: 3 .  albuterol (PROVENTIL) (2.5 MG/3ML) 0.083% nebulizer solution, Take 3 mLs (2.5 mg total) by nebulization every 6 (six) hours as needed for wheezing or shortness  of breath., Disp: 75 mL, Rfl: 12 .  amoxicillin-clavulanate (AUGMENTIN) 875-125 MG tablet, Take 1 tablet by mouth 2 (two) times daily., Disp: 20 tablet, Rfl: 0 .  aspirin 81 MG tablet, Take 1 tablet (81 mg total) by mouth daily., Disp: 90 tablet, Rfl: 5 .  docusate sodium (COLACE) 100 MG capsule, Take 1 capsule (100 mg total) by mouth daily., Disp: 30 capsule, Rfl: 0 .  febuxostat (ULORIC) 40 MG tablet, TAKE 1 TABLET (40 MG TOTAL) BY MOUTH DAILY., Disp: 90 tablet, Rfl: 4 .  furosemide (LASIX) 40 MG tablet, Take 1 tablet (40 mg total) by mouth 2 (two) times daily. (Patient taking differently: Take 40 mg by mouth 2 (two) times daily. '60mg'$  in the morning and '40mg'$  in the evening), Disp: 180 tablet, Rfl: 5 .   montelukast (SINGULAIR) 10 MG tablet, Take 1 tablet (10 mg total) by mouth at bedtime., Disp: 30 tablet, Rfl: 11 .  potassium chloride (K-DUR) 10 MEQ tablet, Take 1 tablet (10 mEq total) by mouth daily., Disp: 90 tablet, Rfl: 1 .  SYMBICORT 160-4.5 MCG/ACT inhaler, INHALE 2 PUFFS BY MOUTH TWICE DAILY, Disp: 1 Inhaler, Rfl: 11   Exam: Current vital signs: BP 112/72   Pulse (!) 59   Resp 15   Ht '5\' 4"'$  (1.626 m)   Wt (!) 165.1 kg   SpO2 98%   BMI 62.48 kg/m  Vital signs in last 24 hours: Temp:  [98.2 F (36.8 C)] 98.2 F (36.8 C) (02/17 1026) Pulse Rate:  [59-69] 59 (02/18 0800) Resp:  [15-16] 15 (02/18 0545) BP: (107-136)/(63-91) 112/72 (02/18 0800) SpO2:  [95 %-99 %] 98 % (02/18 0800) Weight:  [165.1 kg] 165.1 kg (02/18 0534)  Physical Exam   Pulse Rate:  [59-64] 59 (02/18 0800) Resp:  [15] 15 (02/18 0545) BP: (112-136)/(72-91) 112/72 (02/18 0800) SpO2:  [96 %-99 %] 98 % (02/18 0800) Weight:  [165.1 kg] 165.1 kg (02/18 0534)  General - morbid obesity, well developed, in no apparent distress.  Ophthalmologic - fundi not visualized due to noncooperation.  Cardiovascular - Regular rate and rhythm.  Mental Status -  Level of arousal and orientation to time, place, and person were intact. Language including expression, naming, repetition, comprehension was assessed and found intact. Fund of Knowledge was assessed and was intact.  Cranial Nerves II - XII - II - Visual field intact OU. Left frontal bruise III, IV, VI - Extraocular movements intact. V - Facial sensation intact bilaterally. VII - Facial movement intact bilaterally. VIII - Hearing & vestibular intact bilaterally. X - Palate elevates symmetrically. XI - Chin turning & shoulder shrug intact bilaterally. XII - Tongue protrusion intact.  Motor Strength - The patient's strength was normal in all extremities and pronator drift was absent, except right LE 4/5 due to pain at left knee with large bruise.  Bulk was  normal and fasciculations were absent.   Motor Tone - Muscle tone was assessed at the neck and appendages and was normal.  Reflexes - The patient's reflexes were symmetrical in all extremities and she had no pathological reflexes.  Sensory - Light touch, temperature/pinprick were assessed and were symmetrical.    Coordination - The patient had normal movements in the hands with no ataxia or dysmetria.  Tremor was absent.  Gait and Station - deferred.   Labs I have reviewed labs in epic and the results pertinent to this consultation are:   CBC    Component Value Date/Time   WBC 4.5 09/06/2018 0556  RBC 5.28 (H) 09/06/2018 0556   HGB 11.2 (L) 09/06/2018 0556   HGB 13.2 03/14/2018 1207   HGB 11.8 05/31/2017 1050   HCT 38.4 09/06/2018 0556   HCT 43.0 03/14/2018 1207   HCT 37.5 05/31/2017 1050   PLT 172 09/06/2018 0556   PLT 190 03/14/2018 1207   MCV 72.7 (L) 09/06/2018 0556   MCV 72 (L) 03/14/2018 1207   MCV 71.2 (L) 05/31/2017 1050   MCH 21.2 (L) 09/06/2018 0556   MCHC 29.2 (L) 09/06/2018 0556   RDW 15.2 09/06/2018 0556   RDW 14.8 03/14/2018 1207   RDW 15.1 (H) 05/31/2017 1050   LYMPHSABS 1.4 09/06/2018 0556   LYMPHSABS 0.7 03/14/2018 1207   LYMPHSABS 1.2 05/31/2017 1050   MONOABS 0.4 09/06/2018 0556   MONOABS 0.4 05/31/2017 1050   EOSABS 0.6 (H) 09/06/2018 0556   EOSABS 0.3 03/14/2018 1207   BASOSABS 0.0 09/06/2018 0556   BASOSABS 0.0 03/14/2018 1207   BASOSABS 0.0 05/31/2017 1050    CMP     Component Value Date/Time   NA 142 09/06/2018 0556   NA 138 03/14/2018 1207   NA 142 05/31/2017 1050   K 3.3 (L) 09/06/2018 0556   K 4.2 05/31/2017 1050   CL 103 09/06/2018 0556   CO2 27 09/06/2018 0556   CO2 30 (H) 05/31/2017 1050   GLUCOSE 112 (H) 09/06/2018 0556   GLUCOSE 94 05/31/2017 1050   BUN 25 (H) 09/06/2018 0556   BUN 21 03/14/2018 1207   BUN 26.0 05/31/2017 1050   CREATININE 1.97 (H) 09/06/2018 0556   CREATININE 1.66 (H) 06/23/2018 1300   CREATININE 1.6  (H) 05/31/2017 1050   CALCIUM 8.4 (L) 09/06/2018 0556   CALCIUM 9.0 05/31/2017 1050   PROT 6.3 (L) 09/06/2018 0556   PROT 7.0 03/14/2018 1207   PROT 7.1 05/31/2017 1050   ALBUMIN 3.1 (L) 09/06/2018 0556   ALBUMIN 3.9 03/14/2018 1207   ALBUMIN 3.1 (L) 05/31/2017 1050   AST 22 09/06/2018 0556   AST 19 06/23/2018 1300   AST 19 05/31/2017 1050   ALT 13 09/06/2018 0556   ALT 16 06/23/2018 1300   ALT 14 05/31/2017 1050   ALKPHOS 99 09/06/2018 0556   ALKPHOS 143 05/31/2017 1050   BILITOT 0.4 09/06/2018 0556   BILITOT 0.5 06/23/2018 1300   BILITOT 0.43 05/31/2017 1050   GFRNONAA 26 (L) 09/06/2018 0556   GFRNONAA 32 (L) 06/23/2018 1300   GFRNONAA 36 (L) 05/19/2017 1210   GFRAA 30 (L) 09/06/2018 0556   GFRAA 37 (L) 06/23/2018 1300   GFRAA 41 (L) 05/19/2017 1210     Imaging Dg Chest 2 View  Result Date: 09/01/2018 CLINICAL DATA:  Pt presents with bilateral lower extremity swelling. R>L. Denies SOB or chest pain, pt sent from Dr office, hx COPD, CHF, lung ca, sarcoidosis EXAM: CHEST - 2 VIEW COMPARISON:  12/27/2017 FINDINGS: The heart size and mediastinal contours are within normal limits. Both lungs are clear. The visualized skeletal structures are unremarkable. IMPRESSION: No active cardiopulmonary disease. Electronically Signed   By: Nolon Nations M.D.   On: 09/01/2018 13:58   Ct Head Wo Contrast  Result Date: 09/06/2018 CLINICAL DATA:  Posttraumatic altered mental state status EXAM: CT HEAD WITHOUT CONTRAST CT CERVICAL SPINE WITHOUT CONTRAST TECHNIQUE: Multidetector CT imaging of the head and cervical spine was performed following the standard protocol without intravenous contrast. Multiplanar CT image reconstructions of the cervical spine were also generated. COMPARISON:  Brain MRI 03/01/2018 FINDINGS: CT HEAD FINDINGS Brain:  No evidence of acute infarction, hemorrhage, hydrocephalus, extra-axial collection or mass lesion/mass effect. Vascular: No hyperdense vessel or unexpected  calcification. Skull: Large left frontal scalp hematoma. No underlying calvarial fracture. Sinuses/Orbits: Negative Other: Motion degraded such that repeat images were needed. CT CERVICAL SPINE FINDINGS Alignment: Normal Skull base and vertebrae: No acute fracture. No primary bone lesion or focal pathologic process. Soft tissues and spinal canal: No prevertebral fluid or swelling. No visible canal hematoma. Disc levels:  No significant degenerative change Upper chest: Negative. IMPRESSION: 1. No evidence of intracranial or cervical spine injury. 2. Scalp hematoma without calvarial fracture. Electronically Signed   By: Monte Fantasia M.D.   On: 09/06/2018 06:23   Ct Cervical Spine Wo Contrast  Result Date: 09/06/2018 CLINICAL DATA:  Posttraumatic altered mental state status EXAM: CT HEAD WITHOUT CONTRAST CT CERVICAL SPINE WITHOUT CONTRAST TECHNIQUE: Multidetector CT imaging of the head and cervical spine was performed following the standard protocol without intravenous contrast. Multiplanar CT image reconstructions of the cervical spine were also generated. COMPARISON:  Brain MRI 03/01/2018 FINDINGS: CT HEAD FINDINGS Brain: No evidence of acute infarction, hemorrhage, hydrocephalus, extra-axial collection or mass lesion/mass effect. Vascular: No hyperdense vessel or unexpected calcification. Skull: Large left frontal scalp hematoma. No underlying calvarial fracture. Sinuses/Orbits: Negative Other: Motion degraded such that repeat images were needed. CT CERVICAL SPINE FINDINGS Alignment: Normal Skull base and vertebrae: No acute fracture. No primary bone lesion or focal pathologic process. Soft tissues and spinal canal: No prevertebral fluid or swelling. No visible canal hematoma. Disc levels:  No significant degenerative change Upper chest: Negative. IMPRESSION: 1. No evidence of intracranial or cervical spine injury. 2. Scalp hematoma without calvarial fracture. Electronically Signed   By: Monte Fantasia M.D.    On: 09/06/2018 06:23   Mr Jodene Nam Head Wo Contrast  Result Date: 09/06/2018 CLINICAL DATA:  Followup thalamic stroke. EXAM: MRA HEAD WITHOUT CONTRAST TECHNIQUE: Angiographic images of the Circle of Willis were obtained using MRA technique without intravenous contrast. COMPARISON:  MRI same day. FINDINGS: Both internal carotid arteries are widely patent into the brain. No siphon stenosis. The anterior and middle cerebral vessels are patent without proximal stenosis, aneurysm or vascular malformation. Both vertebral arteries are widely patent to the basilar. No basilar stenosis. Posterior circulation branch vessels appear normal. IMPRESSION: Normal examination. Specifically, no posterior circulation or basilar tip abnormality seen. Electronically Signed   By: Nelson Chimes M.D.   On: 09/06/2018 14:02   Mr Brain Wo Contrast  Result Date: 09/06/2018 CLINICAL DATA:  History of lung cancer and sarcoidosis. Altered mental status. Found on the floor. EXAM: MRI HEAD WITHOUT CONTRAST TECHNIQUE: Multiplanar, multiecho pulse sequences of the brain and surrounding structures were obtained without intravenous contrast. COMPARISON:  CT same day.  MRI 03/01/2018 FINDINGS: Brain: Diffusion imaging shows acute infarction in both thalami. Each region of infarction measures about a cm in size. The brainstem is otherwise normal. There are a few old small vessel cerebellar infarctions. Cerebral hemispheres otherwise show minimal small vessel change of the white matter. No cortical or large vessel territory infarction. No mass lesion, hemorrhage, hydrocephalus or extra-axial collection. Vascular: Major vessels at the base of the brain show flow. This includes flow being apparent in the vertebrobasilar system. Skull and upper cervical spine: Negative Sinuses/Orbits: Clear/normal Other: Left forehead and frontal scalp hematoma. IMPRESSION: Acute infarction within both the right and left thalami. 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 Left forehead and frontal scalp hematoma. Electronically Signed   By: Nelson Chimes M.D.   On: 09/06/2018 10:44   Dg Chest Portable 1 View  Result Date: 09/06/2018 CLINICAL DATA:  Altered mental status EXAM: PORTABLE CHEST 1 VIEW COMPARISON:  Five days ago FINDINGS: Low volume chest. There is no edema, consolidation, effusion, or pneumothorax. Linear atelectasis in the right mid lung. Borderline heart size accentuated by low volumes. IMPRESSION: Low volume chest without acute finding. Electronically Signed   By: Monte Fantasia M.D.   On: 09/06/2018 06:38   Dg Knee Right Port  Result Date: 09/06/2018 CLINICAL DATA:  Fall with right knee bruising EXAM: PORTABLE RIGHT KNEE - 1-2 VIEW COMPARISON:  None. FINDINGS: Anterior soft tissue swelling. Chronic fragmentation or articular bodies above the patella. There is tricompartmental degenerative spurring. No acute fracture or subluxation. IMPRESSION: Soft tissue swelling without acute fracture. Electronically Signed   By: Monte Fantasia M.D.   On: 09/06/2018 06:39   Vas US Carotid (at [place] Only)  Result Date: 09/06/2018 Carotid Arterial Duplex Study Indications: CVA. Limitations: thick neck Performing Technologist: June Leap RDMS, RVT  Examination Guidelines: A complete evaluation includes B-mode imaging, spectral Doppler, color Doppler, and power Doppler as needed of all accessible portions of each vessel. Bilateral testing is considered an integral part of a complete examination. Limited examinations for reoccurring indications may be performed as noted.  Right Carotid Findings: +----------+--------+--------+--------+--------+------------------+           PSV cm/sEDV cm/sStenosisDescribeComments           +----------+--------+--------+--------+--------+------------------+ CCA Prox  54      14                                          +----------+--------+--------+--------+--------+------------------+ CCA Distal45      4                                          +----------+--------+--------+--------+--------+------------------+ ICA Prox  27      10      1-39%           intimal thickening +----------+--------+--------+--------+--------+------------------+ ICA Distal92      25                                         +----------+--------+--------+--------+--------+------------------+ ECA       68      7                                          +----------+--------+--------+--------+--------+------------------+ +----------+--------+-------+----------------+-------------------+           PSV cm/sEDV cmsDescribe        Arm Pressure (mmHG) +----------+--------+-------+----------------+-------------------+ [id number]            Multiphasic, WNL                    +----------+--------+-------+----------------+-------------------+ +---------+--------+--+--------+--+---------+ VertebralPSV cm/s60EDV cm/s17Antegrade +---------+--------+--+--------+--+---------+  Left Carotid Findings: +----------+--------+--------+--------+--------+------------------+           PSV cm/sEDV cm/sStenosisDescribeComments           +----------+--------+--------+--------+--------+------------------+  CCA Prox  55      14                                         +----------+--------+--------+--------+--------+------------------+ CCA Distal59      11                                         +----------+--------+--------+--------+--------+------------------+ ICA Prox  55      16      1-39%           intimal thickening +----------+--------+--------+--------+--------+------------------+ ICA Distal137     38                                         +----------+--------+--------+--------+--------+------------------+ ECA       54      8                                           +----------+--------+--------+--------+--------+------------------+ +----------+--------+--------+----------------+-------------------+ SubclavianPSV cm/sEDV cm/sDescribe        Arm Pressure (mmHG) +----------+--------+--------+----------------+-------------------+           144             Multiphasic, WNL                    +----------+--------+--------+----------------+-------------------+ +---------+--------+--+--------+--+---------+ VertebralPSV cm/s56EDV cm/s19Antegrade +---------+--------+--+--------+--+---------+  Summary: Right Carotid: Velocities in the right ICA are consistent with a 1-39% stenosis. Left Carotid: Velocities in the left ICA are consistent with a 1-39% stenosis. Vertebrals: Bilateral vertebral arteries demonstrate antegrade flow. *See table(s) above for measurements and observations.     Preliminary    Vas [place] Lower Extremity Venous (dvt)  Result Date: 09/06/2018  Lower Venous Study Indications: Edema.  Limitations: Body habitus and poor ultrasound/tissue interface. Performing Technologist: June Leap RDMS, RVT  Examination Guidelines: A complete evaluation includes B-mode imaging, spectral Doppler, color Doppler, and power Doppler as needed of all accessible portions of each vessel. Bilateral testing is considered an integral part of a complete examination. Limited examinations for reoccurring indications may be performed as noted.  Right Venous Findings: +---------+---------------+---------+-----------+----------+--------------+          CompressibilityPhasicitySpontaneityPropertiesSummary        +---------+---------------+---------+-----------+----------+--------------+ CFV      Full           Yes      Yes                                 +---------+---------------+---------+-----------+----------+--------------+ SFJ      Full                                                         +---------+---------------+---------+-----------+----------+--------------+ FV Prox  Full                                                        +---------+---------------+---------+-----------+----------+--------------+  FV Mid   Full                                                        +---------+---------------+---------+-----------+----------+--------------+ FV DistalFull                                                        +---------+---------------+---------+-----------+----------+--------------+ PFV      Full                                                        +---------+---------------+---------+-----------+----------+--------------+ POP      Full           Yes      Yes                                 +---------+---------------+---------+-----------+----------+--------------+ PTV                                                   Not visualized +---------+---------------+---------+-----------+----------+--------------+ PERO                                                  Not visualized +---------+---------------+---------+-----------+----------+--------------+  Left Venous Findings: +---------+---------------+---------+-----------+----------+--------------+          CompressibilityPhasicitySpontaneityPropertiesSummary        +---------+---------------+---------+-----------+----------+--------------+ CFV      Full           Yes      Yes                                 +---------+---------------+---------+-----------+----------+--------------+ SFJ      Full                                                        +---------+---------------+---------+-----------+----------+--------------+ FV Prox  Full                                                        +---------+---------------+---------+-----------+----------+--------------+ FV Mid   Full                                                         +---------+---------------+---------+-----------+----------+--------------+  FV DistalFull                                                        +---------+---------------+---------+-----------+----------+--------------+ PFV      Full                                                        +---------+---------------+---------+-----------+----------+--------------+ POP      Full           Yes      Yes                                 +---------+---------------+---------+-----------+----------+--------------+ PTV                                                   Not visualized +---------+---------------+---------+-----------+----------+--------------+ PERO                                                  Not visualized +---------+---------------+---------+-----------+----------+--------------+    Summary: Right: There is no evidence of deep vein thrombosis in the lower extremity. However, portions of this examination were limited- see technologist comments above. No cystic structure found in the popliteal fossa. Left: There is no evidence of deep vein thrombosis in the lower extremity. However, portions of this examination were limited- see technologist comments above. No cystic structure found in the popliteal fossa.  *See table(s) above for measurements and observations.    Preliminary     Assessment:  68 year old female with with PMH of sleep apnea, sickle cell, sarcoidosis, lung cancer, hypertension, CHF and asthma presented with sudden onset dizziness, blurry vision and fall. Currently still has left frontal and right knee bruise and pain. CT negative but MRI showed b/l thalamic infarcts. She had recent BLE swelling, will do LE venous doppler. UDS neg.   Her stroke could be due to infarct from artery of percharon or due to basilar tip embolic source. Certainly, she has multiple risk factors for stroke. Will continue stroke work up including MRA, LE venous doppler, CUS and  TTE.   Recommendations: - MRA head - CUS doppler  - TTE - LE venous doppler - lipid panel and A1C - PT/OT/speech - neuro check - continue ASA for now. - stroke team to follow in am  Rosalin Hawking, MD PhD Stroke Neurology 09/06/2018 4:08 PM

## 2018-09-06 NOTE — Progress Notes (Signed)
Carotid duplex and LE venous duplex       has been completed. Preliminary results can be found under CV proc through chart review. Eliza Green, BS, RDMS, RVT   

## 2018-09-06 NOTE — ED Notes (Signed)
Echo is progress

## 2018-09-06 NOTE — ED Notes (Signed)
2mg  Narcan given, no change

## 2018-09-06 NOTE — ED Provider Notes (Addendum)
TIME SEEN: 5:49 AM  CHIEF COMPLAINT: Altered mental status  HPI: Patient is a 68 year old female with history of CHF, COPD, hypertension, previous lung cancer sarcoidosis who presents to the emergency department with altered mental status.  Per EMS, patient last seen normal around 10 PM.  Was found on the floor this morning around 4:45 AM altered.  EMS reports patient was following some commands and talking intermittently but keeps her eyes closed.  Blood glucose normal.  Normal vital signs.  Has a hematoma to the left forehead.  Has some blood coming out of the left nostril but EMS reports this is where they tried to place an NPA.     ROS: Level 5 caveat for altered mental status  PAST MEDICAL HISTORY/PAST SURGICAL HISTORY:  Past Medical History:  Diagnosis Date  . Arthritis   . Asthma   . Cancer ([place])    lung, adenocarcinoma right lung 2012  . CHF (congestive heart failure) ([facility])    Preserved EF  . Congenital single kidney    With chronic kidney disease  . COPD (chronic obstructive pulmonary disease) ([place])   . Gout   . Hypertension   . Lung cancer [facility]) 2012   Right upper lobe lung adenocarcinoma diagnosed with needle biopsy treated by SBRT finished treatment April 2013 has been monitored since  . Mass of chest wall, right    Right chest wall mass 7.3 cm biopsy on 12/13/2013. Patient notes it was consistent with sarcoidosis but actual pathology results not available.  . Oxygen deficiency   . Sarcoidosis   . Sickle cell trait ([place])   . Sleep apnea     MEDICATIONS:  Prior to Admission medications   Medication Sig Start Date End Date Taking? Authorizing Provider  acetaminophen (TYLENOL) 500 MG tablet Take 1 tablet (500 mg total) by mouth every 6 (six) hours as needed. Patient taking differently: Take 1,000 mg by mouth daily as needed for mild pain or headache.  04/16/15   Dorena Dew, FNP  albuterol (PROAIR HFA) 108 (90 Base) MCG/ACT inhaler Inhale 2 puffs into the lungs  every 6 (six) hours as needed for wheezing or shortness of breath. 04/28/16   Parrett, Fonnie Mu, NP  albuterol (PROVENTIL) (2.5 MG/3ML) 0.083% nebulizer solution Take 3 mLs (2.5 mg total) by nebulization every 6 (six) hours as needed for wheezing or shortness of breath. 12/10/17   Dorena Dew, FNP  amoxicillin-clavulanate (AUGMENTIN) 875-125 MG tablet Take 1 tablet by mouth 2 (two) times daily. 09/05/18   Lanae Boast, FNP  aspirin 81 MG tablet Take 1 tablet (81 mg total) by mouth daily. 01/02/16   Dorena Dew, FNP  docusate sodium (COLACE) 100 MG capsule Take 1 capsule (100 mg total) by mouth daily. 01/02/16   Dorena Dew, FNP  febuxostat (ULORIC) 40 MG tablet TAKE 1 TABLET (40 MG TOTAL) BY MOUTH DAILY. 08/17/18   Lanae Boast, FNP  furosemide (LASIX) 40 MG tablet Take 1 tablet (40 mg total) by mouth 2 (two) times daily. Patient taking differently: Take 40 mg by mouth 2 (two) times daily. 78m in the morning and 461min the evening 01/19/18   HoDorena DewFNP  montelukast (SINGULAIR) 10 MG tablet Take 1 tablet (10 mg total) by mouth at bedtime. 09/01/18   WeTanda RockersMD  potassium chloride (K-DUR) 10 MEQ tablet Take 1 tablet (10 mEq total) by mouth daily. 09/05/18   DoLanae BoastFNP  SYMBICORT 160-4.5 MCG/ACT inhaler INHALE 2 PUFFS BY  MOUTH TWICE DAILY 02/23/18   Tanda Rockers, MD    ALLERGIES:  Allergies  Allergen Reactions  . Sulfur Rash    SOCIAL HISTORY:  Social History   Tobacco Use  . Smoking status: Never Smoker  . Smokeless tobacco: Never Used  Substance Use Topics  . Alcohol use: No    Alcohol/week: 0.0 standard drinks    FAMILY HISTORY: Family History  Problem Relation Age of Onset  . Hypertension Mother   . Renal Disease Mother   . Cancer Father        stomach  . Heart disease Father        No details  . Cancer Brother   . Diabetes Brother   . Cervical cancer Sister   . Diabetes Sister   . Multiple myeloma Sister     EXAM: BP 136/84  (BP Location: Right Wrist)   Pulse 64   Ht '5\' 4"'  (1.626 m)   Wt (!) 165.1 kg   SpO2 99%   BMI 62.48 kg/m  CONSTITUTIONAL: Patient is alert and arouses to painful stimuli.  She will grimace with sternal rub and moves all 4 extremities equally and purposefully with painful stimuli.  She intermittently opens her eyes with painful stimuli but mostly squeezes them tightly closed.  She will intermittently talk after painful stimuli.  She does move all her extremities on command but does not lift her extremities off the bed. HEAD: Normocephalic; hematoma is noted to the left forehead EYES: Conjunctivae clear, PERRL, EOMI, pupils approximately 3 to 4 mm bilaterally ENT: normal nose; no rhinorrhea; moist mucous membranes; pharynx without lesions noted; no dental injury; no septal hematoma, small amount of dried blood around the left nostril NECK: Supple, no meningismus, no LAD; no midline spinal tenderness, step-off or deformity; trachea midline, cervical collar in place CARD: RRR; S1 and S2 appreciated; no murmurs, no clicks, no rubs, no gallops RESP: Normal chest excursion without splinting or tachypnea; breath sounds clear and equal bilaterally; no wheezes, no rhonchi, no rales; no hypoxia or respiratory distress CHEST:  chest wall stable, no crepitus or ecchymosis or deformity, nontender to palpation; no flail chest, excoriated lesions underneath the left breast without sign of superimposed infection ABD/GI: Normal bowel sounds; non-distended; soft, non-tender, no rebound, no guarding; no ecchymosis or other lesions noted PELVIS:  stable, nontender to palpation BACK:  The back appears normal and is non-tender to palpation, there is no CVA tenderness; no midline spinal tenderness, step-off or deformity EXT: Large area of ecchymosis to the soft tissues over the anterior right knee but no obvious joint effusion or bony deformity.  Otherwise no sign of traumatic injury to her extremities.  She has 2+ radial  and DP pulses bilaterally.  She has significant edema in bilateral lower extremities from the mid shin down.  Compartments soft.  No warmth or significant redness noted. SKIN: Normal color for age and race; warm NEURO: Patient will at times open her eyes to painful stimuli.  She intermittently talks with clear speech but does not answer all questions.  She will obey some commands and move her arms (lift them briefly without drift) and wiggle her toes and she will localize with painful stimuli.  Her GCS ranges between 8 and 11.  She has a gag reflex.  No facial asymmetry.  No dysarthria.  No obvious focal neurologic deficit other than being obtunded.  MEDICAL DECISION MAKING: Patient here with altered mental status.  Found down this morning.  Last seen normal  around 10 PM per her son.  Patient will intermittently talk to [place], open eyes and will follow some commands and moves all extremities equally but does so with painful stimuli.  She has a GCS that ranges between 8 and 9 but has a gag reflex and protecting her airway.  At this time I do not feel she needs intubation.  Possible stroke but patient is outside TPA window and has nonfocal exam other than being obtunded.  Blood glucose here normal.  Vital signs normal.  ABG obtained which shows no significant abnormality, no hypercarbia.  Will obtain CT of the head, cervical spine.  Will obtain labs.  Rectal temperature 97.4.  ED PROGRESS: Patient's work-up has been so far unremarkable.  CT of the head and cervical spine show no acute abnormality.  Cervical collar has been removed.  Urinalysis shows no sign of infection or dehydration.  Drug screen negative.  Ammonia level normal.  Lactate normal.  Chest x-ray shows no sign of infiltrate or edema.  X-ray of the right knee normal.  Will discuss with medicine for admission.  Son at bedside has been updated with plan.  We discussed that I am concerned for stroke but given her last seen normal of over 7 hours prior to  arrival she would not be a TPA candidate.  We were unable to test if patient was LVO positive.      7:16 AM Discussed patient's case with hospitalist, Dr. Olevia Bowens.  I have recommended admission and patient (and family if present) agree with this plan. Admitting physician will place admission orders.   I reviewed all nursing notes, vitals, pertinent previous records, EKGs, lab and urine results, imaging (as available).  7:20 AM  D/w Dr. Leonel Ramsay with neurology who will see patient in consultation.  Agrees with MRI brain without contrast which has been ordered.  Patient still hemodynamically stable, protecting airway.      Date: 09/06/2018 5:36 AM  Rate: 66  Rhythm: normal sinus rhythm  QRS Axis: normal  Intervals: LAFB  ST/T Wave abnormalities: normal  Conduction Disutrbances: none  Narrative Interpretation: LAFB      CRITICAL CARE Performed by: Pryor Curia   Total critical care time: 75 minutes  Critical care time was exclusive of separately billable procedures and treating other patients.  Critical care was necessary to treat or prevent imminent or life-threatening deterioration.  Critical care was time spent personally by me on the following activities: development of treatment plan with patient and/or surrogate as well as nursing, discussions with consultants, evaluation of patient's response to treatment, examination of patient, obtaining history from patient or surrogate, ordering and performing treatments and interventions, ordering and review of laboratory studies, ordering and review of radiographic studies, pulse oximetry and re-evaluation of patient's condition.    , Delice Bison, DO 09/06/18 [id number]    , Delice Bison, DO 09/06/18 [id number]

## 2018-09-06 NOTE — ED Notes (Signed)
Pt transported to MRI 

## 2018-09-06 NOTE — ED Notes (Signed)
Dr Leonides Schanz at bedside speaking with family

## 2018-09-06 NOTE — ED Notes (Signed)
Unable to obtain vitals, Pt off the unit for multiple procedures.

## 2018-09-06 NOTE — Progress Notes (Signed)
  Echocardiogram 2D Echocardiogram has been performed.  Pamela [name] 09/06/2018, 4:27 PM

## 2018-09-06 NOTE — ED Notes (Signed)
Attempted to give IV potassium, current lines appears infiltrated. Rn will place IV team consult

## 2018-09-06 NOTE — ED Triage Notes (Signed)
BIB EMS from home, LKW unsure (sometime last night)  Pt was found on floor this AM, altered, with hematoma to L forehead, blood on nose. GCS initially 14 with EMS, mental status declined en route. GCS 9 upon arrival to ED. Pt currently maintaining airway.

## 2018-09-07 ENCOUNTER — Ambulatory Visit (HOSPITAL_COMMUNITY): Payer: Medicare Other

## 2018-09-07 ENCOUNTER — Inpatient Hospital Stay (HOSPITAL_COMMUNITY): Payer: Medicare Other

## 2018-09-07 LAB — HIV ANTIBODY (ROUTINE TESTING W REFLEX): HIV Screen 4th Generation wRfx: NONREACTIVE

## 2018-09-07 LAB — CBC WITH DIFFERENTIAL/PLATELET
Abs Immature Granulocytes: 0.02 10*3/uL (ref 0.00–0.07)
Basophils Absolute: 0 10*3/uL (ref 0.0–0.1)
Basophils Relative: 1 %
Eosinophils Absolute: 0.5 10*3/uL (ref 0.0–0.5)
Eosinophils Relative: 11 %
HCT: 37.3 % (ref 36.0–46.0)
Hemoglobin: 11 g/dL — ABNORMAL LOW (ref 12.0–15.0)
Immature Granulocytes: 1 %
Lymphocytes Relative: 27 %
Lymphs Abs: 1.1 10*3/uL (ref 0.7–4.0)
MCH: 21.3 pg — ABNORMAL LOW (ref 26.0–34.0)
MCHC: 29.5 g/dL — ABNORMAL LOW (ref 30.0–36.0)
MCV: 72.1 fL — ABNORMAL LOW (ref 80.0–100.0)
Monocytes Absolute: 0.4 10*3/uL (ref 0.1–1.0)
Monocytes Relative: 9 %
Neutro Abs: 2.2 10*3/uL (ref 1.7–7.7)
Neutrophils Relative %: 51 %
Platelets: 144 10*3/uL — ABNORMAL LOW (ref 150–400)
RBC: 5.17 MIL/uL — ABNORMAL HIGH (ref 3.87–5.11)
RDW: 15.3 % (ref 11.5–15.5)
WBC: 4.1 10*3/uL (ref 4.0–10.5)
nRBC: 0 % (ref 0.0–0.2)

## 2018-09-07 LAB — RENAL FUNCTION PANEL
Albumin: 3.1 g/dL — ABNORMAL LOW (ref 3.5–5.0)
Anion gap: 8 (ref 5–15)
BUN: 22 mg/dL (ref 8–23)
CO2: 25 mmol/L (ref 22–32)
Calcium: 8.4 mg/dL — ABNORMAL LOW (ref 8.9–10.3)
Chloride: 107 mmol/L (ref 98–111)
Creatinine, Ser: 1.6 mg/dL — ABNORMAL HIGH (ref 0.44–1.00)
GFR calc Af Amer: 39 mL/min — ABNORMAL LOW (ref >60)
GFR calc non Af Amer: 33 mL/min — ABNORMAL LOW (ref >60)
Glucose, Bld: 99 mg/dL (ref 70–99)
Phosphorus: 3.4 mg/dL (ref 2.5–4.6)
Potassium: 4.3 mmol/L (ref 3.5–5.1)
Sodium: 140 mmol/L (ref 135–145)

## 2018-09-07 LAB — LIPID PANEL
CHOLESTEROL: 157 mg/dL (ref 0–200)
HDL: 41 mg/dL (ref >40)
LDL Cholesterol: 95 mg/dL (ref 0–99)
TRIGLYCERIDES: 104 mg/dL (ref <150)
Total CHOL/HDL Ratio: 3.8 RATIO
VLDL: 21 mg/dL (ref 0–40)

## 2018-09-07 LAB — MAGNESIUM: Magnesium: 1.8 mg/dL (ref 1.7–2.4)

## 2018-09-07 MED ORDER — ATORVASTATIN CALCIUM 40 MG PO TABS
40.0000 mg | ORAL_TABLET | Freq: Every day | ORAL | Status: DC
  Administered 2018-09-07 – 2018-09-09 (×3): 40 mg via ORAL
  Filled 2018-09-07 (×3): qty 1

## 2018-09-07 MED ORDER — PRO-STAT SUGAR FREE PO LIQD
30.0000 mL | Freq: Two times a day (BID) | ORAL | Status: DC
  Administered 2018-09-07 – 2018-09-09 (×3): 30 mL via ORAL
  Filled 2018-09-07 (×4): qty 30

## 2018-09-07 MED ORDER — IPRATROPIUM-ALBUTEROL 0.5-2.5 (3) MG/3ML IN SOLN: 3.0000 mL | RESPIRATORY_TRACT | Status: DC | PRN

## 2018-09-07 MED ORDER — IPRATROPIUM-ALBUTEROL 0.5-2.5 (3) MG/3ML IN SOLN
3.0000 mL | Freq: Three times a day (TID) | RESPIRATORY_TRACT | Status: DC
  Administered 2018-09-07: 3 mL via RESPIRATORY_TRACT
  Filled 2018-09-07: qty 3

## 2018-09-07 NOTE — Consult Note (Addendum)
ELECTROPHYSIOLOGY CONSULT NOTE  Patient ID: Pamela [name] MRN: [id number], DOB/AGE: 1951/07/14   Admit date: 09/06/2018 Date of Consult: 09/07/2018  Primary Physician: Lanae Boast, [place] Primary Cardiologist: Dr. Percival Spanish (2016) Reason for Consultation: Cryptogenic stroke ; recommendations regarding Implantable Loop Recorder requested by Dr. Leonie Man  History of Present Illness Pamela [name] was admitted on 09/06/2018 with syncope/trauma, CVA.  They first developed symptoms while at home, was laying in bed, had sudden change in vision, "suddenly thought "I hope I am not having a stroke", got up and fell, she recalls the fall and remembers hitting her head but the hours afterwards. She has no h/o syncope, and denies fainting this event.   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  she has undergone workup for stroke including echocardiogram and carotid dopplers.  The patient has been monitored on telemetry which has demonstrated sinus rhythm with no arrhythmias.  Inpatient stroke work-up is to be completed with a TEE.   Echocardiogram this admission demonstrated  IMPRESSIONS  1. The left ventricle has hyperdynamic systolic function, with an ejection fraction of >65%. The cavity size was normal. Left ventricular diastolic parameters were normal.  2. The right ventricle has normal systolic function. The cavity was normal. There is no increase in right ventricular wall thickness.  3. The mitral valve is normal in structure.  4. The tricuspid valve is normal in structure.  5. Aortic valve is probably tricuspid.  6. The interatrial septum is  aneurysmal. FINDINGS  Left Ventricle: The left ventricle has hyperdynamic systolic function, with an ejection fraction of >65%. The cavity size was normal. There is no increase in left ventricular wall thickness. Left ventricular diastolic parameters were normal Right Ventricle: The right ventricle has normal systolic function. The cavity was normal. There is no increase in right ventricular wall thickness.   Lab work is reviewed. AKI is improving  Prior to admission, the patient denies chest pain, shortness of breath, dizziness, palpitations, or syncope.  They are recovering from their stroke with plans to home at discharge.   Past Medical History:  Diagnosis Date  . Arthritis   . Asthma   . Cancer ([place])    lung, adenocarcinoma right lung 2012  . CHF (congestive heart failure) ([facility])    Preserved EF  . Congenital single kidney    With chronic kidney disease  . COPD (chronic obstructive pulmonary disease) ([place])   . Gout   . Hypertension   . Lung cancer [facility]) 2012   Right upper lobe lung adenocarcinoma diagnosed with needle biopsy treated by SBRT finished treatment April 2013 has been monitored since  . Mass of chest wall, right    Right chest wall mass 7.3 cm biopsy on 12/13/2013. Patient notes it was consistent with sarcoidosis but actual pathology results not available.  . Oxygen deficiency   . Sarcoidosis   . Sickle cell trait ([place])   . Sleep apnea      Surgical History:  Past Surgical History:  Procedure Laterality Date  . LUNG BIOPSY    . TUBAL LIGATION    . VEIN LIGATION AND STRIPPING       Medications Prior to Admission  Medication Sig Dispense Refill Last  Dose  . acetaminophen (TYLENOL) 500 MG tablet Take 1 tablet (500 mg total) by mouth every 6 (six) hours as needed. (Patient taking differently: Take 1,000 mg by mouth daily as needed for mild pain or headache. ) 30 tablet 2 09/06/2018 at prn  . albuterol (PROAIR HFA) 108 (90 Base) MCG/ACT inhaler Inhale 2 puffs  into the lungs every 6 (six) hours as needed for wheezing or shortness of breath. 1 Inhaler 3 Past Month at prn  . albuterol (PROVENTIL) (2.5 MG/3ML) 0.083% nebulizer solution Take 3 mLs (2.5 mg total) by nebulization every 6 (six) hours as needed for wheezing or shortness of breath. 75 mL 12 Past Month at prn  . aspirin 81 MG tablet Take 1 tablet (81 mg total) by mouth daily. 90 tablet 5 09/06/2018 at Unknown time  . calcitRIOL (ROCALTROL) 0.25 MCG capsule Take 0.25 mcg by mouth daily.   09/05/2018 at Unknown time  . docusate sodium (COLACE) 100 MG capsule Take 1 capsule (100 mg total) by mouth daily. (Patient taking differently: Take 100 mg by mouth 2 (two) times daily. ) 30 capsule 0 09/06/2018 at Unknown time  . febuxostat (ULORIC) 40 MG tablet TAKE 1 TABLET (40 MG TOTAL) BY MOUTH DAILY. (Patient taking differently: Take 40 mg by mouth daily. ) 90 tablet 4 08/15/2018  . furosemide (LASIX) 40 MG tablet Take 1 tablet (40 mg total) by mouth 2 (two) times daily. (Patient taking differently: Take 80-120 mg by mouth See admin instructions. Take 120 in the morning and 40 mg in the evening) 180 tablet 5 09/06/2018 at Unknown time  . potassium chloride (K-DUR) 10 MEQ tablet Take 1 tablet (10 mEq total) by mouth daily. 90 tablet 1 09/06/2018 at Unknown time  . SYMBICORT 160-4.5 MCG/ACT inhaler INHALE 2 PUFFS BY MOUTH TWICE DAILY (Patient taking differently: Inhale 2 puffs into the lungs 2 (two) times daily. ) 1 Inhaler 11 09/06/2018 at Unknown time  . amoxicillin-clavulanate (AUGMENTIN) 875-125 MG tablet Take 1 tablet by mouth 2 (two) times daily. 20 tablet 0   . montelukast (SINGULAIR) 10 MG tablet Take 1 tablet (10 mg total) by mouth at bedtime. 30 tablet 11 Taking    Inpatient Medications:  . aspirin  300 mg Rectal Daily   Or  . aspirin  325 mg Oral Daily  . budesonide (PULMICORT) nebulizer solution  0.5 mg Nebulization BID  . enoxaparin (LOVENOX) injection  40 mg Subcutaneous Q24H    Allergies:   Allergies  Allergen Reactions  . Sulfur Rash    Social History   Socioeconomic History  . Marital status: Divorced    Spouse name: Not on file  . Number of children: 2  . Years of education: Not on file  . Highest education level: Not on file  Occupational History  . Not on file  Social Needs  . Financial resource strain: Not on file  . Food insecurity:    Worry: Not on file    Inability: Not on file  . Transportation needs:    Medical: Not on file    Non-medical: Not on file  Tobacco Use  . Smoking status: Never Smoker  . Smokeless tobacco: Never Used  Substance and Sexual Activity  . Alcohol use: No    Alcohol/week: 0.0 standard drinks  . Drug use: No  . Sexual activity: Not on file  Lifestyle  . Physical activity:    Days per week: Not on file    Minutes per session: Not on file  .  Stress: Not on file  Relationships  . Social connections:    Talks on phone: Not on file    Gets together: Not on file    Attends religious service: Not on file    Active member of club or organization: Not on file    Attends meetings of clubs or organizations: Not on file    Relationship status: Not on file  . Intimate partner violence:    Fear of current or ex partner: Not on file    Emotionally abused: Not on file    Physically abused: Not on file    Forced sexual activity: Not on file  Other Topics Concern  . Not on file  Social History Narrative   Lives with son.  One son is deceased.       Family History  Problem Relation Age of Onset  . Hypertension Mother   . Renal Disease Mother   . Cancer Father        stomach  . Heart disease Father        No details  . Cancer Brother   . Diabetes Brother   . Cervical cancer Sister   . Diabetes Sister   . Multiple myeloma Sister       Review of Systems: All other systems reviewed and are otherwise negative except as noted above.  Physical Exam: Vitals:   09/07/18 0126 09/07/18 0325 09/07/18 0721 09/07/18 0819  BP:   (!) 97/47 (!) 115/55   Pulse:   71   Resp:  16 19   Temp:  97.9 F (36.6 C) 98.9 F (37.2 C)   TempSrc:  Axillary Oral   SpO2: 99% 92% 98% 100%  Weight:      Height:        GEN- The patient is well appearing, alert and oriented x 3 today.   Head- normocephalic, abrasion/swelling L forehead, periorbital ecchymosis/swelling Eyes-  Sclera clear, conjunctiva pink Ears- hearing intact Oropharynx- clear Neck- supple Lungs- CTA b/l, normal work of breathing, end exp wheezing Heart- RRR, no murmurs, rubs or gallops  GI- soft, NT, ND, obese Extremities- no clubbing, cyanosis, or edema MS- no significant deformity or atrophy Skin- no rash or lesion Psych- euthymic mood, full affect   Labs:   Lab Results  Component Value Date   WBC 4.1 09/07/2018   HGB 11.0 (L) 09/07/2018   HCT 37.3 09/07/2018   MCV 72.1 (L) 09/07/2018   PLT 144 (L) 09/07/2018    Recent Labs  Lab 09/06/18 0556 09/07/18 1140  NA 142 140  K 3.3* 4.3  CL 103 107  CO2 27 25  BUN 25* 22  CREATININE 1.97* 1.60*  CALCIUM 8.4* 8.4*  PROT 6.3*  --   BILITOT 0.4  --   ALKPHOS 99  --   ALT 13  --   AST 22  --   GLUCOSE 112* 99   Lab Results  Component Value Date   TROPONINI <0.03 09/01/2018   Lab Results  Component Value Date   CHOL 157 09/07/2018   CHOL 171 04/13/2014   Lab Results  Component Value Date   HDL 41 09/07/2018   HDL 59 04/13/2014   Lab Results  Component Value Date   LDLCALC 95 09/07/2018   LDLCALC 89 04/13/2014   Lab Results  Component Value Date   TRIG 104 09/07/2018   TRIG 116 04/13/2014   Lab Results  Component Value Date   CHOLHDL 3.8 09/07/2018   CHOLHDL 2.9 04/13/2014  No results found for: LDLDIRECT  No results found for: DDIMER   Radiology/Studies:   Dg Chest 2 View Result Date: 09/01/2018 CLINICAL DATA:  Pt presents with bilateral lower extremity swelling. R>L. Denies SOB or chest pain, pt sent from Dr office, hx COPD, CHF, lung ca, sarcoidosis EXAM: CHEST  - 2 VIEW COMPARISON:  12/27/2017 FINDINGS: The heart size and mediastinal contours are within normal limits. Both lungs are clear. The visualized skeletal structures are unremarkable. IMPRESSION: No active cardiopulmonary disease. Electronically Signed   By: Nolon Nations M.D.   On: 09/01/2018 13:58    Ct Head Wo Contrast Result Date: 09/06/2018 CLINICAL DATA:  Posttraumatic altered mental state status EXAM: CT HEAD WITHOUT CONTRAST CT CERVICAL SPINE WITHOUT CONTRAST TECHNIQUE: Multidetector CT imaging of the head and cervical spine was performed following the standard protocol without intravenous contrast. Multiplanar CT image reconstructions of the cervical spine were also generated. COMPARISON:  Brain MRI 03/01/2018 FINDINGS: CT HEAD FINDINGS Brain: No evidence of acute infarction, hemorrhage, hydrocephalus, extra-axial collection or mass lesion/mass effect. Vascular: No hyperdense vessel or unexpected calcification. Skull: Large left frontal scalp hematoma. No underlying calvarial fracture. Sinuses/Orbits: Negative Other: Motion degraded such that repeat images were needed. CT CERVICAL SPINE FINDINGS Alignment: Normal Skull base and vertebrae: No acute fracture. No primary bone lesion or focal pathologic process. Soft tissues and spinal canal: No prevertebral fluid or swelling. No visible canal hematoma. Disc levels:  No significant degenerative change Upper chest: Negative. IMPRESSION: 1. No evidence of intracranial or cervical spine injury. 2. Scalp hematoma without calvarial fracture. Electronically Signed   By: Monte Fantasia M.D.   On: 09/06/2018 06:23      Mr Jodene Nam Head Wo Contrast Result Date: 09/06/2018 CLINICAL DATA:  Followup thalamic stroke. EXAM: MRA HEAD WITHOUT CONTRAST TECHNIQUE: Angiographic images of the Circle of Willis were obtained using MRA technique without intravenous contrast. COMPARISON:  MRI same day. FINDINGS: Both internal carotid arteries are widely patent into the brain.  No siphon stenosis. The anterior and middle cerebral vessels are patent without proximal stenosis, aneurysm or vascular malformation. Both vertebral arteries are widely patent to the basilar. No basilar stenosis. Posterior circulation branch vessels appear normal. IMPRESSION: Normal examination. Specifically, no posterior circulation or basilar tip abnormality seen. Electronically Signed   By: Nelson Chimes M.D.   On: 09/06/2018 14:02    Mr Brain Wo Contrast Result Date: 09/06/2018 CLINICAL DATA:  History of lung cancer and sarcoidosis. Altered mental status. Found on the floor. EXAM: MRI HEAD WITHOUT CONTRAST TECHNIQUE: Multiplanar, multiecho pulse sequences of the brain and surrounding structures were obtained without intravenous contrast. COMPARISON:  CT same day.  MRI 03/01/2018 FINDINGS: Brain: Diffusion imaging shows acute infarction in both thalami. Each region of infarction measures about a cm in size. The brainstem is otherwise normal. There are a few old small vessel cerebellar infarctions. Cerebral hemispheres otherwise show minimal small vessel change of the white matter. No cortical or large vessel territory infarction. No mass lesion, hemorrhage, hydrocephalus or extra-axial collection. Vascular: Major vessels at the base of the brain show flow. This includes flow being apparent in the vertebrobasilar system. Skull and upper cervical spine: Negative Sinuses/Orbits: Clear/normal Other: Left forehead and frontal scalp hematoma. IMPRESSION: Acute infarction within both the right and left thalami. 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 Left forehead and frontal scalp hematoma. Electronically Signed   By: Nelson Chimes  M.D.   On: 09/06/2018 10:44    Dg Chest Portable 1 View Result Date: 09/06/2018 CLINICAL DATA:  Altered mental status EXAM: PORTABLE CHEST 1 VIEW COMPARISON:  Five days ago FINDINGS: Low  volume chest. There is no edema, consolidation, effusion, or pneumothorax. Linear atelectasis in the right mid lung. Borderline heart size accentuated by low volumes. IMPRESSION: Low volume chest without acute finding. Electronically Signed   By: Monte Fantasia M.D.   On: 09/06/2018 06:38    Vas US Carotid (at [place] Only) Result Date: 09/06/2018 Carotid Arterial Duplex Study Indications: CVA. Limitations: thick neck Performing Technologist: June Leap RDMS, RVT  Examination Guidelines: A complete evaluation includes B-mode imaging, spectral Doppler, color Doppler, and power Doppler as needed of all accessible portions of each vessel. Bilateral testing is considered an integral part of a complete examination. Limited examinations for reoccurring indications may be performed as noted. Summary: Right Carotid: Velocities in the right ICA are consistent with a 1-39% stenosis. Left Carotid: Velocities in the left ICA are consistent with a 1-39% stenosis. Vertebrals: Bilateral vertebral arteries demonstrate antegrade flow. *See table(s) above for measurements and observations.     Preliminary      Vas [place] Lower Extremity Venous (dvt) Result Date: 09/06/2018  Lower Venous Study Indications: Edema.  Limitations: Body habitus and poor ultrasound/tissue interface. Performing Technologist: June Leap RDMS, RVT  Examination Guidelines: A complete evaluation includes B-mode imaging, spectral Doppler, color Doppler, and power Doppler as needed of all accessible portions of each vessel. Bilateral testing is considered an integral part of a complete examination. Limited examinations for reoccurring indications may be performed as noted. Summary: Right: There is no evidence of deep vein thrombosis in the lower extremity. However, portions of this examination were limited- see technologist comments above. No cystic structure found in the popliteal fossa. Left: There is no evidence of deep vein thrombosis in the lower  extremity. However, portions of this examination were limited- see technologist comments above. No cystic structure found in the popliteal fossa.  *See table(s) above for measurements and observations. Electronically signed by Deitra Mayo MD on 09/06/2018 at 8:03:08 PM.    Final     12-lead ECG SR All prior EKG's in EPIC reviewed with no documented atrial fibrillation  Telemetry SR  Assessment and Plan:  1. Cryptogenic stroke The patient presents with cryptogenic stroke.  The patient has a TEE planned for today.  I spoke at length with the patient and her daughter at bedside about monitoring for afib with either a 30 day event monitor or an implantable loop recorder.  Risks, benefits, and alteratives to implantable loop recorder were discussed with the patient today.   At this time, the patient is very clear in her decision to to proceed with implantable loop recorder, pending TEE findings   Wound care was reviewed with the patient (keep incision clean and dry for 3 days).  Wound check will be scheduled for the patient.  She has had head trauma associate with her stroke, if AF is found early , may need neurology input for start of a/c  Please call with questions.   Renee Dyane Dustman, PA-C 09/07/2018  EP Attending  Patient seen and examined. Agree with above. The patient has had a cryptogenic stroke. She will undergo insertion of an ILR. I have reviewed the indications/risks/benefits/goals/expectations of insertion of an ILR and she wishes to proceed.  Mikle Bosworth.D.

## 2018-09-07 NOTE — Progress Notes (Addendum)
STROKE TEAM PROGRESS NOTE   INTERVAL HISTORY No family is at the bedside.  Neurologically stable since yesterday.  She has no memory deficits noted on testing.testing clinical presentation. She said she just felt suddenly face forward and hurt her 400 and left eye and has no memory for several hours to finish the hospital. She had no focal neurological deficits when she woke up. MRI shows bilateral anterior medial thalamic infarcts  Vitals:   09/07/18 0126 09/07/18 0325 09/07/18 0721 09/07/18 0819  BP:  (!) 97/47 (!) 115/55   Pulse:   71   Resp:  16 19   Temp:  97.9 F (36.6 C) 98.9 F (37.2 C)   TempSrc:  Axillary Oral   SpO2: 99% 92% 98% 100%  Weight:      Height:        CBC:  Recent Labs  Lab 09/01/18 1250 09/06/18 0548 09/06/18 0556  WBC 4.6  --  4.5  NEUTROABS 2.2  --  2.1  HGB 12.0 10.9* 11.2*  HCT 39.9 32.0* 38.4  MCV 73.6*  --  72.7*  PLT 177  --  941    Basic Metabolic Panel:  Recent Labs  Lab 09/01/18 1250  09/06/18 0548 09/06/18 0556 09/06/18 0825  NA 140  --  140 142  --   K 4.3   < > 4.0 3.3*  --   CL 103  --   --  103  --   CO2 28  --   --  27  --   GLUCOSE 98  --   --  112*  --   BUN 23  --   --  25*  --   CREATININE 1.61*  --   --  1.97*  --   CALCIUM 8.7*  --   --  8.4*  --   MG  --   --   --   --  1.6*  PHOS  --   --   --   --  4.1   < > = values in this interval not displayed.   Lipid Panel:     Component Value Date/Time   CHOL 157 09/07/2018 0416   TRIG 104 09/07/2018 0416   HDL 41 09/07/2018 0416   CHOLHDL 3.8 09/07/2018 0416   VLDL 21 09/07/2018 0416   LDLCALC 95 09/07/2018 0416   HgbA1c:  Lab Results  Component Value Date   HGBA1C 5.5 09/01/2018   Urine Drug Screen:     Component Value Date/Time   LABOPIA NONE DETECTED 09/06/2018 0616   COCAINSCRNUR NONE DETECTED 09/06/2018 0616   LABBENZ NONE DETECTED 09/06/2018 0616   AMPHETMU NONE DETECTED 09/06/2018 0616   THCU NONE DETECTED 09/06/2018 0616   LABBARB NONE DETECTED  09/06/2018 0616    Alcohol Level     Component Value Date/Time   ETH <10 09/06/2018 0825    IMAGING Ct Head Wo Contrast  Result Date: 09/06/2018 CLINICAL DATA:  Posttraumatic altered mental state status EXAM: CT HEAD WITHOUT CONTRAST CT CERVICAL SPINE WITHOUT CONTRAST TECHNIQUE: Multidetector CT imaging of the head and cervical spine was performed following the standard protocol without intravenous contrast. Multiplanar CT image reconstructions of the cervical spine were also generated. COMPARISON:  Brain MRI 03/01/2018 FINDINGS: CT HEAD FINDINGS Brain: No evidence of acute infarction, hemorrhage, hydrocephalus, extra-axial collection or mass lesion/mass effect. Vascular: No hyperdense vessel or unexpected calcification. Skull: Large left frontal scalp hematoma. No underlying calvarial fracture. Sinuses/Orbits: Negative Other: Motion degraded such that repeat images  were needed. CT CERVICAL SPINE FINDINGS Alignment: Normal Skull base and vertebrae: No acute fracture. No primary bone lesion or focal pathologic process. Soft tissues and spinal canal: No prevertebral fluid or swelling. No visible canal hematoma. Disc levels:  No significant degenerative change Upper chest: Negative. IMPRESSION: 1. No evidence of intracranial or cervical spine injury. 2. Scalp hematoma without calvarial fracture. Electronically Signed   By: Monte Fantasia M.D.   On: 09/06/2018 06:23   Ct Cervical Spine Wo Contrast  Result Date: 09/06/2018 CLINICAL DATA:  Posttraumatic altered mental state status EXAM: CT HEAD WITHOUT CONTRAST CT CERVICAL SPINE WITHOUT CONTRAST TECHNIQUE: Multidetector CT imaging of the head and cervical spine was performed following the standard protocol without intravenous contrast. Multiplanar CT image reconstructions of the cervical spine were also generated. COMPARISON:  Brain MRI 03/01/2018 FINDINGS: CT HEAD FINDINGS Brain: No evidence of acute infarction, hemorrhage, hydrocephalus, extra-axial  collection or mass lesion/mass effect. Vascular: No hyperdense vessel or unexpected calcification. Skull: Large left frontal scalp hematoma. No underlying calvarial fracture. Sinuses/Orbits: Negative Other: Motion degraded such that repeat images were needed. CT CERVICAL SPINE FINDINGS Alignment: Normal Skull base and vertebrae: No acute fracture. No primary bone lesion or focal pathologic process. Soft tissues and spinal canal: No prevertebral fluid or swelling. No visible canal hematoma. Disc levels:  No significant degenerative change Upper chest: Negative. IMPRESSION: 1. No evidence of intracranial or cervical spine injury. 2. Scalp hematoma without calvarial fracture. Electronically Signed   By: Monte Fantasia M.D.   On: 09/06/2018 06:23   Mr Jodene Nam Head Wo Contrast  Result Date: 09/06/2018 CLINICAL DATA:  Followup thalamic stroke. EXAM: MRA HEAD WITHOUT CONTRAST TECHNIQUE: Angiographic images of the Circle of Willis were obtained using MRA technique without intravenous contrast. COMPARISON:  MRI same day. FINDINGS: Both internal carotid arteries are widely patent into the brain. No siphon stenosis. The anterior and middle cerebral vessels are patent without proximal stenosis, aneurysm or vascular malformation. Both vertebral arteries are widely patent to the basilar. No basilar stenosis. Posterior circulation branch vessels appear normal. IMPRESSION: Normal examination. Specifically, no posterior circulation or basilar tip abnormality seen. Electronically Signed   By: Nelson Chimes M.D.   On: 09/06/2018 14:02   Mr Brain Wo Contrast  Result Date: 09/06/2018 CLINICAL DATA:  History of lung cancer and sarcoidosis. Altered mental status. Found on the floor. EXAM: MRI HEAD WITHOUT CONTRAST TECHNIQUE: Multiplanar, multiecho pulse sequences of the brain and surrounding structures were obtained without intravenous contrast. COMPARISON:  CT same day.  MRI 03/01/2018 FINDINGS: Brain: Diffusion imaging shows acute  infarction in both thalami. Each region of infarction measures about a cm in size. The brainstem is otherwise normal. There are a few old small vessel cerebellar infarctions. Cerebral hemispheres otherwise show minimal small vessel change of the white matter. No cortical or large vessel territory infarction. No mass lesion, hemorrhage, hydrocephalus or extra-axial collection. Vascular: Major vessels at the base of the brain show flow. This includes flow being apparent in the vertebrobasilar system. Skull and upper cervical spine: Negative Sinuses/Orbits: Clear/normal Other: Left forehead and frontal scalp hematoma. IMPRESSION: Acute infarction within both the right and left thalami. 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 Left forehead and frontal scalp hematoma. Electronically Signed   By: Nelson Chimes M.D.   On: 09/06/2018 10:44   Dg Chest Portable 1 View  Result Date: 09/06/2018 CLINICAL DATA:  Altered mental  status EXAM: PORTABLE CHEST 1 VIEW COMPARISON:  Five days ago FINDINGS: Low volume chest. There is no edema, consolidation, effusion, or pneumothorax. Linear atelectasis in the right mid lung. Borderline heart size accentuated by low volumes. IMPRESSION: Low volume chest without acute finding. Electronically Signed   By: Monte Fantasia M.D.   On: 09/06/2018 06:38   Dg Knee Right Port  Result Date: 09/06/2018 CLINICAL DATA:  Fall with right knee bruising EXAM: PORTABLE RIGHT KNEE - 1-2 VIEW COMPARISON:  None. FINDINGS: Anterior soft tissue swelling. Chronic fragmentation or articular bodies above the patella. There is tricompartmental degenerative spurring. No acute fracture or subluxation. IMPRESSION: Soft tissue swelling without acute fracture. Electronically Signed   By: Monte Fantasia M.D.   On: 09/06/2018 06:39   Vas US Carotid (at [place] Only)  Result Date: 09/06/2018 Carotid Arterial Duplex  Study Indications: CVA. Limitations: thick neck Performing Technologist: June Leap RDMS, RVT  Examination Guidelines: A complete evaluation includes B-mode imaging, spectral Doppler, color Doppler, and power Doppler as needed of all accessible portions of each vessel. Bilateral testing is considered an integral part of a complete examination. Limited examinations for reoccurring indications may be performed as noted.  Right Carotid Findings: +----------+--------+--------+--------+--------+------------------+           PSV cm/sEDV cm/sStenosisDescribeComments           +----------+--------+--------+--------+--------+------------------+ CCA Prox  54      14                                         +----------+--------+--------+--------+--------+------------------+ CCA Distal45      4                                          +----------+--------+--------+--------+--------+------------------+ ICA Prox  27      10      1-39%           intimal thickening +----------+--------+--------+--------+--------+------------------+ ICA Distal92      25                                         +----------+--------+--------+--------+--------+------------------+ ECA       68      7                                          +----------+--------+--------+--------+--------+------------------+ +----------+--------+-------+----------------+-------------------+           PSV cm/sEDV cmsDescribe        Arm Pressure (mmHG) +----------+--------+-------+----------------+-------------------+ [id number]            Multiphasic, WNL                    +----------+--------+-------+----------------+-------------------+ +---------+--------+--+--------+--+---------+ VertebralPSV cm/s60EDV cm/s17Antegrade +---------+--------+--+--------+--+---------+  Left Carotid Findings: +----------+--------+--------+--------+--------+------------------+           PSV cm/sEDV  cm/sStenosisDescribeComments           +----------+--------+--------+--------+--------+------------------+ CCA Prox  55      14                                         +----------+--------+--------+--------+--------+------------------+  CCA Distal59      11                                         +----------+--------+--------+--------+--------+------------------+ ICA Prox  55      16      1-39%           intimal thickening +----------+--------+--------+--------+--------+------------------+ ICA Distal137     38                                         +----------+--------+--------+--------+--------+------------------+ ECA       54      8                                          +----------+--------+--------+--------+--------+------------------+ +----------+--------+--------+----------------+-------------------+ SubclavianPSV cm/sEDV cm/sDescribe        Arm Pressure (mmHG) +----------+--------+--------+----------------+-------------------+           144             Multiphasic, WNL                    +----------+--------+--------+----------------+-------------------+ +---------+--------+--+--------+--+---------+ VertebralPSV cm/s56EDV cm/s19Antegrade +---------+--------+--+--------+--+---------+  Summary: Right Carotid: Velocities in the right ICA are consistent with a 1-39% stenosis. Left Carotid: Velocities in the left ICA are consistent with a 1-39% stenosis. Vertebrals: Bilateral vertebral arteries demonstrate antegrade flow. *See table(s) above for measurements and observations.     Preliminary    Vas [place] Lower Extremity Venous (dvt)  Result Date: 09/06/2018  Lower Venous Study Indications: Edema.  Limitations: Body habitus and poor ultrasound/tissue interface. Performing Technologist: June Leap RDMS, RVT  Examination Guidelines: A complete evaluation includes B-mode imaging, spectral Doppler, color Doppler, and power Doppler as needed of all accessible  portions of each vessel. Bilateral testing is considered an integral part of a complete examination. Limited examinations for reoccurring indications may be performed as noted.  Right Venous Findings: +---------+---------------+---------+-----------+----------+--------------+          CompressibilityPhasicitySpontaneityPropertiesSummary        +---------+---------------+---------+-----------+----------+--------------+ CFV      Full           Yes      Yes                                 +---------+---------------+---------+-----------+----------+--------------+ SFJ      Full                                                        +---------+---------------+---------+-----------+----------+--------------+ FV Prox  Full                                                        +---------+---------------+---------+-----------+----------+--------------+ FV Mid   Full                                                        +---------+---------------+---------+-----------+----------+--------------+  FV DistalFull                                                        +---------+---------------+---------+-----------+----------+--------------+ PFV      Full                                                        +---------+---------------+---------+-----------+----------+--------------+ POP      Full           Yes      Yes                                 +---------+---------------+---------+-----------+----------+--------------+ PTV                                                   Not visualized +---------+---------------+---------+-----------+----------+--------------+ PERO                                                  Not visualized +---------+---------------+---------+-----------+----------+--------------+  Left Venous Findings: +---------+---------------+---------+-----------+----------+--------------+           CompressibilityPhasicitySpontaneityPropertiesSummary        +---------+---------------+---------+-----------+----------+--------------+ CFV      Full           Yes      Yes                                 +---------+---------------+---------+-----------+----------+--------------+ SFJ      Full                                                        +---------+---------------+---------+-----------+----------+--------------+ FV Prox  Full                                                        +---------+---------------+---------+-----------+----------+--------------+ FV Mid   Full                                                        +---------+---------------+---------+-----------+----------+--------------+ FV DistalFull                                                        +---------+---------------+---------+-----------+----------+--------------+  PFV      Full                                                        +---------+---------------+---------+-----------+----------+--------------+ POP      Full           Yes      Yes                                 +---------+---------------+---------+-----------+----------+--------------+ PTV                                                   Not visualized +---------+---------------+---------+-----------+----------+--------------+ PERO                                                  Not visualized +---------+---------------+---------+-----------+----------+--------------+    Summary: Right: There is no evidence of deep vein thrombosis in the lower extremity. However, portions of this examination were limited- see technologist comments above. No cystic structure found in the popliteal fossa. Left: There is no evidence of deep vein thrombosis in the lower extremity. However, portions of this examination were limited- see technologist comments above. No cystic structure found in the popliteal fossa.  *See  table(s) above for measurements and observations. Electronically signed by Deitra Mayo MD on 09/06/2018 at 8:03:08 PM.    Final     PHYSICAL EXAM Obese middle-aged African-American lady currently not in distress. . Afebrile. Head is nontraumatic. Neck is supple without bruit.    Cardiac exam no murmur or gallop. Lungs are clear to auscultation. Distal pulses are well felt. Neurological Exam ;  Awake  Alert oriented x 3. Normal speech and language.diminished recall 2/3. Able to name 9 animals with four legs.eye movements full without nystagmus.fundi were not visualized. Vision acuity and fields appear normal. Hearing is normal. Palatal movements are normal. Face symmetric. Tongue midline. Normal strength, tone, reflexes and coordination. Normal sensation. Gait deferred.  ASSESSMENT/PLAN Pamela [name] is a 68 y.o. female with history of of sleep apnea, sickle cell trait, sarcoidosis, lung cancer, hypertension, CHF and asthma presenting with altered mental status following fall during the night associated with dizziness, blurry vision.   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 Scalp hematoma  MRI R & L thalamic infarcts both at the midline.  Left forehead and frontal scalp hematoma  MRA normal.  No basilar tip abnormality  Carotid Doppler  B ICA 1-39% stenosis, VAs antegrade   2D Echo  EF > 65%. No source of embolus   LE doppler no DVT  TEE to look for embolic source. Arranged with [place] for tomorrow.  If positive for PFO (patent foramen ovale), check bilateral lower extremity venous dopplers to rule out DVT as possible source of stroke. (I have made patient NPO after midnight tonight).  If TEE negative, a [place] electrophysiologist will consult and consider placement of an  implantable loop recorder to evaluate for atrial fibrillation as etiology of stroke. This has been explained to patient/family by Dr. Leonie Man and they are agreeable.   LDL 95  HgbA1c pending   Lovenox 40 mg sq daily for VTE prophylaxis  aspirin 81 mg daily prior to admission, now on aspirin 325 mg daily. Given eyelid bruising and additional bruising, will not given DAPT. Continue aspirin 325 mg at d/c  Therapy recommendations:  pending   Disposition:  pending   Hypertension  Stable . Permissive hypertension (OK if < 220/120) but gradually normalize in 5-7 days . Long-term BP goal normotensive  Hyperlipidemia  Home meds: No statin  LDL 95, goal < 70  Add statin - lipitor 40  Continue statin at discharge  Pre Diabetes   HgbA1c pending , goal < 7.0  Other Stroke Risk Factors  Advanced age  Morbid obesity, Body mass index is 62.06 kg/m., recommend weight loss, diet and exercise as appropriate   Obstructive sleep apnea, not on CPAP at home  Congestive heart failure  Sickle cell trait  Sarcoidosis   Other Active Problems  Hx lung cancer R lung 2012  Hypokalemia  Hypomagnesemia  Hospital day # [place], MSN, APRN, ANVP-BC, AGPCNP-BC Advanced Practice Stroke Nurse [place] for Schedule & Pager information 09/07/2018 2:42 PM  I have personally obtained history,examined this patient, reviewed notes, independently viewed imaging studies, participated in medical decision making and plan of care.ROS completed by me personally and pertinent positives fully documented  I have made any additions or clarifications directly to the above note. Agree with note above.  He presented with sudden onset of loss of consciousness and fall without any aura resulting in facial contusion and brief loss of consciousness and short-term memory loss but no other focal deficits. 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 Recommend check TEE and loop recorder and cardiac monitoring. Also check EEG. Aspirin for now. Greater than 50% time during this 35 minute visit was spent on counseling and coordination of care note for embolic stroke and answering questions  Antony Contras, MD Medical Director [name] Pager: [phone] 09/07/2018 2:57 PM  To contact Stroke Continuity provider, please refer to [email or web address] After hours, contact General Neurology

## 2018-09-07 NOTE — Progress Notes (Signed)
   [place] [facility] has been requested to perform a transesophageal echocardiogram on Pamela [name] for acute stroke.  After careful review of history and examination, the risks and benefits of transesophageal echocardiogram have been explained including risks of esophageal damage, perforation (1:10,000 risk), bleeding, pharyngeal hematoma as well as other potential complications associated with conscious sedation including aspiration, arrhythmia, respiratory failure and death. Alternatives to treatment were discussed, questions were answered. Patient is willing to proceed.   Procedure is scheduled for 09/08/2018 at 10:30am with Dr. Johnsie Cancel.  Darreld Mclean, PA-C 09/07/2018 1:29 PM

## 2018-09-07 NOTE — Progress Notes (Signed)
Initial Nutrition Assessment  DOCUMENTATION CODES:   Morbid obesity  INTERVENTION:  Provide 30 ml Prostat po BID, each supplement provides 100 kcal and 15 grams of protein.   Encourage adequate PO intake.   NUTRITION DIAGNOSIS:   Increased nutrient needs related to chronic illness(COPD, CHF) as evidenced by estimated needs.  GOAL:   Patient will meet greater than or equal to 90% of their needs  MONITOR:   PO intake, Supplement acceptance, Labs, Weight trends, I & O's, Skin  REASON FOR ASSESSMENT:   Low Braden    ASSESSMENT:   68 y.o. female with medical history significant of osteoarthritis, asthma, lung cancer, COPD, gout, hypertension, history of right upper lobe lung adenocarcinoma, sarcoidosis, CHF presents after being found on the floor altered with a hematoma on her left forehead and epistaxis from her left nostril.  MRI of the brain done revealed bilateral thalamic infarct.   Pt unavailable during time of visit. RD unable to obtain most recent nutrition therapy. Pt with no significant weight loss per weight records. Unable to complete Nutrition-Focused physical exam at this time. RD to perform physical exam at next visit. RD to order Prostat to aid in adequate protein needs.   Labs and medications reviewed.   Diet Order:   Diet Order            Diet NPO time specified  Diet effective midnight        Diet heart healthy/carb modified Room service appropriate? Yes; Fluid consistency: Thin  Diet effective now              EDUCATION NEEDS:   Not appropriate for education at this time  Skin:  Skin Assessment: Reviewed RN Assessment  Last BM:  2/17  Height:   Ht Readings from Last 1 Encounters:  09/06/18 5\' 4"  (1.626 m)    Weight:   Wt Readings from Last 1 Encounters:  09/06/18 (!) 164 kg    Ideal Body Weight:  54.5 kg  BMI:  Body mass index is 62.06 kg/m.  Estimated Nutritional Needs:   Kcal:  1850-2100  Protein:  100-115 grams  Fluid:   1.8 - 2.1 L/day    Corrin Parker, MS, RD, LDN Pager # [phone] After hours/ weekend pager # [phone]

## 2018-09-07 NOTE — Progress Notes (Signed)
PROGRESS NOTE    Pamela [name]  DTO:[id number] DOB: [birth year] DOA: 09/06/2018 PCP: Lanae Boast, FNP  Outpatient Specialists:   Brief Narrative:  Pamela [name] is a 68 y.o. female with medical history significant of osteoarthritis, asthma, lung cancer, COPD, gout, hypertension, history of right upper lobe lung adenocarcinoma, right chest wall mass, sarcoidosis, sickle cell trait, sleep apnea who was brought to the emergency department after being found on the floor altered with a hematoma on her left forehead and epistaxis from her left nostril.  MRI of the brain done revealed bilateral thalamic infarct.  Patient is currently on aspirin.  The neurology team is directing care.  Assessment & Plan:   Principal Problem:   Acute CVA (cerebrovascular accident) [facility]) Active Problems:   Prediabetes   Essential hypertension   Morbid (severe) obesity due to excess calories ([place])   Obstructive sleep apnea   Anemia, iron deficiency   Closed head injury with concussion   Hypokalemia   Hypomagnesemia  Acute CVA (cerebrovascular accident) ([place]) Continue aspirin.   Neurology input is highly appreciated.   PT/OT/SLP. Follow carotid Doppler. Doppler ultrasound of the legs did not reveal any DVT. Echocardiogram revealed EF of greater than 65% and aneurysmal interatrial septum. Further management will depend on hospital course.  Possible closed head injury with concussion: Patient's mentation has improved significantly. Continue to monitor.  Prediabetes Follow hemoglobin A1c.  Essential hypertension Hold antihypertensives for now. Allow permissive hypertension up to 220/120 mmHg. Monitor blood pressure closely. PRN antihypertensive for SBP over 983 or systolic over 382 mmHg.  Morbid (severe) obesity due to excess calories ([place]) Will need significant lifestyle modifications (diet and exercise).  Obstructive sleep apnea Noncompliant.   Quit using CPAP machine.    Anemia, iron  deficiency Monitor hematocrit and hemoglobin.  Hypokalemia Repeat renal panel.  Hypomagnesemia Repeat level.  DVT prophylaxis: SCDs due to forehead/right knee hematomas. Code Status: Full code. Family Communication: Her son was by bedside. Disposition Plan: Admit for CVA work-up. Consults called: Neuro hospitalist service.  Procedures:   None  Antimicrobials:   None   Subjective: No new complaints.  Objective: Vitals:   09/07/18 0126 09/07/18 0325 09/07/18 0721 09/07/18 0819  BP:  (!) 97/47 (!) 115/55   Pulse:   71   Resp:  16 19   Temp:  97.9 F (36.6 C) 98.9 F (37.2 C)   TempSrc:  Axillary Oral   SpO2: 99% 92% 98% 100%  Weight:      Height:       No intake or output data in the 24 hours ending 09/07/18 0843 Filed Weights   09/06/18 0534 09/06/18 1719  Weight: (!) 165.1 kg (!) 164 kg    Examination:  General exam: Patient is morbidly obese.  Left frontal hematoma is noted.  Continue any obvious distress.   Respiratory system: Clear to auscultation. Cardiovascular system: S1 & S2 heard. Gastrointestinal system: Abdomen is morbidly obese, soft and nontender.  Organs are difficult to assess.   Central nervous system: Awake and alert.  Patient moves all limbs.   Extremities: Hematoma right knee.  Data Reviewed: I have personally reviewed following labs and imaging studies  CBC: Recent Labs  Lab 09/01/18 1250 09/06/18 0548 09/06/18 0556  WBC 4.6  --  4.5  NEUTROABS 2.2  --  2.1  HGB 12.0 10.9* 11.2*  HCT 39.9 32.0* 38.4  MCV 73.6*  --  72.7*  PLT 177  --  505   Basic Metabolic Panel: Recent Labs  Lab  09/01/18 1250 09/05/18 1127 09/06/18 0548 09/06/18 0556 09/06/18 0825  NA 140  --  140 142  --   K 4.3 3.2* 4.0 3.3*  --   CL 103  --   --  103  --   CO2 28  --   --  27  --   GLUCOSE 98  --   --  112*  --   BUN 23  --   --  25*  --   CREATININE 1.61*  --   --  1.97*  --   CALCIUM 8.7*  --   --  8.4*  --   MG  --   --   --   --  1.6*    PHOS  --   --   --   --  4.1   GFR: Estimated Creatinine Clearance: 44.2 mL/min (A) (by C-G formula based on SCr of 1.97 mg/dL (H)). Liver Function Tests: Recent Labs  Lab 09/01/18 1250 09/06/18 0556  AST 27 22  ALT 16 13  ALKPHOS 111 99  BILITOT 0.7 0.4  PROT 7.1 6.3*  ALBUMIN 3.5 3.1*   No results for input(s): LIPASE, AMYLASE in the last 168 hours. Recent Labs  Lab 09/06/18 0558  AMMONIA 21   Coagulation Profile: Recent Labs  Lab 09/06/18 0556  INR 0.97   Cardiac Enzymes: Recent Labs  Lab 09/01/18 1250  TROPONINI <0.03   BNP (last 3 results) No results for input(s): PROBNP in the last 8760 hours. HbA1C: No results for input(s): HGBA1C in the last 72 hours. CBG: Recent Labs  Lab 09/06/18 0537  GLUCAP 102*   Lipid Profile: Recent Labs    09/07/18 0416  CHOL 157  HDL 41  LDLCALC 95  TRIG 104  CHOLHDL 3.8   Thyroid Function Tests: No results for input(s): TSH, T4TOTAL, FREET4, T3FREE, THYROIDAB in the last 72 hours. Anemia Panel: No results for input(s): VITAMINB12, FOLATE, FERRITIN, TIBC, IRON, RETICCTPCT in the last 72 hours. Urine analysis:    Component Value Date/Time   COLORURINE YELLOW 09/06/2018 0616   APPEARANCEUR CLEAR 09/06/2018 0616   LABSPEC 1.013 09/06/2018 0616   PHURINE 5.0 09/06/2018 0616   GLUCOSEU NEGATIVE 09/06/2018 0616   HGBUR NEGATIVE 09/06/2018 0616   BILIRUBINUR NEGATIVE 09/06/2018 0616   BILIRUBINUR neg 09/01/2018 1121   KETONESUR NEGATIVE 09/06/2018 0616   PROTEINUR NEGATIVE 09/06/2018 0616   UROBILINOGEN 0.2 09/01/2018 1121   UROBILINOGEN 0.2 09/24/2017 1143   NITRITE NEGATIVE 09/06/2018 0616   LEUKOCYTESUR NEGATIVE 09/06/2018 0616   Sepsis Labs: @LABRCNTIP (procalcitonin:4,lacticidven:4)  )No results found for this or any previous visit (from the past 240 hour(s)).       Radiology Studies: Ct Head Wo Contrast  Result Date: 09/06/2018 CLINICAL DATA:  Posttraumatic altered mental state status EXAM: CT  HEAD WITHOUT CONTRAST CT CERVICAL SPINE WITHOUT CONTRAST TECHNIQUE: Multidetector CT imaging of the head and cervical spine was performed following the standard protocol without intravenous contrast. Multiplanar CT image reconstructions of the cervical spine were also generated. COMPARISON:  Brain MRI 03/01/2018 FINDINGS: CT HEAD FINDINGS Brain: No evidence of acute infarction, hemorrhage, hydrocephalus, extra-axial collection or mass lesion/mass effect. Vascular: No hyperdense vessel or unexpected calcification. Skull: Large left frontal scalp hematoma. No underlying calvarial fracture. Sinuses/Orbits: Negative Other: Motion degraded such that repeat images were needed. CT CERVICAL SPINE FINDINGS Alignment: Normal Skull base and vertebrae: No acute fracture. No primary bone lesion or focal pathologic process. Soft tissues and spinal canal: No prevertebral fluid or swelling.  No visible canal hematoma. Disc levels:  No significant degenerative change Upper chest: Negative. IMPRESSION: 1. No evidence of intracranial or cervical spine injury. 2. Scalp hematoma without calvarial fracture. Electronically Signed   By: Monte Fantasia M.D.   On: 09/06/2018 06:23   Ct Cervical Spine Wo Contrast  Result Date: 09/06/2018 CLINICAL DATA:  Posttraumatic altered mental state status EXAM: CT HEAD WITHOUT CONTRAST CT CERVICAL SPINE WITHOUT CONTRAST TECHNIQUE: Multidetector CT imaging of the head and cervical spine was performed following the standard protocol without intravenous contrast. Multiplanar CT image reconstructions of the cervical spine were also generated. COMPARISON:  Brain MRI 03/01/2018 FINDINGS: CT HEAD FINDINGS Brain: No evidence of acute infarction, hemorrhage, hydrocephalus, extra-axial collection or mass lesion/mass effect. Vascular: No hyperdense vessel or unexpected calcification. Skull: Large left frontal scalp hematoma. No underlying calvarial fracture. Sinuses/Orbits: Negative Other: Motion degraded such  that repeat images were needed. CT CERVICAL SPINE FINDINGS Alignment: Normal Skull base and vertebrae: No acute fracture. No primary bone lesion or focal pathologic process. Soft tissues and spinal canal: No prevertebral fluid or swelling. No visible canal hematoma. Disc levels:  No significant degenerative change Upper chest: Negative. IMPRESSION: 1. No evidence of intracranial or cervical spine injury. 2. Scalp hematoma without calvarial fracture. Electronically Signed   By: Monte Fantasia M.D.   On: 09/06/2018 06:23   Mr Jodene Nam Head Wo Contrast  Result Date: 09/06/2018 CLINICAL DATA:  Followup thalamic stroke. EXAM: MRA HEAD WITHOUT CONTRAST TECHNIQUE: Angiographic images of the Circle of Willis were obtained using MRA technique without intravenous contrast. COMPARISON:  MRI same day. FINDINGS: Both internal carotid arteries are widely patent into the brain. No siphon stenosis. The anterior and middle cerebral vessels are patent without proximal stenosis, aneurysm or vascular malformation. Both vertebral arteries are widely patent to the basilar. No basilar stenosis. Posterior circulation branch vessels appear normal. IMPRESSION: Normal examination. Specifically, no posterior circulation or basilar tip abnormality seen. Electronically Signed   By: Nelson Chimes M.D.   On: 09/06/2018 14:02   Mr Brain Wo Contrast  Result Date: 09/06/2018 CLINICAL DATA:  History of lung cancer and sarcoidosis. Altered mental status. Found on the floor. EXAM: MRI HEAD WITHOUT CONTRAST TECHNIQUE: Multiplanar, multiecho pulse sequences of the brain and surrounding structures were obtained without intravenous contrast. COMPARISON:  CT same day.  MRI 03/01/2018 FINDINGS: Brain: Diffusion imaging shows acute infarction in both thalami. Each region of infarction measures about a cm in size. The brainstem is otherwise normal. There are a few old small vessel cerebellar infarctions. Cerebral hemispheres otherwise show minimal small  vessel change of the white matter. No cortical or large vessel territory infarction. No mass lesion, hemorrhage, hydrocephalus or extra-axial collection. Vascular: Major vessels at the base of the brain show flow. This includes flow being apparent in the vertebrobasilar system. Skull and upper cervical spine: Negative Sinuses/Orbits: Clear/normal Other: Left forehead and frontal scalp hematoma. IMPRESSION: Acute infarction within both the right and left thalami. 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 Left forehead and frontal scalp hematoma. Electronically Signed   By: Nelson Chimes M.D.   On: 09/06/2018 10:44   Dg Chest Portable 1 View  Result Date: 09/06/2018 CLINICAL DATA:  Altered mental status EXAM: PORTABLE CHEST 1 VIEW COMPARISON:  Five days ago FINDINGS: Low volume chest. There is no edema, consolidation, effusion, or pneumothorax. Linear atelectasis in the right mid lung. Borderline heart size  accentuated by low volumes. IMPRESSION: Low volume chest without acute finding. Electronically Signed   By: Monte Fantasia M.D.   On: 09/06/2018 06:38   Dg Knee Right Port  Result Date: 09/06/2018 CLINICAL DATA:  Fall with right knee bruising EXAM: PORTABLE RIGHT KNEE - 1-2 VIEW COMPARISON:  None. FINDINGS: Anterior soft tissue swelling. Chronic fragmentation or articular bodies above the patella. There is tricompartmental degenerative spurring. No acute fracture or subluxation. IMPRESSION: Soft tissue swelling without acute fracture. Electronically Signed   By: Monte Fantasia M.D.   On: 09/06/2018 06:39   Vas US Carotid (at [place] Only)  Result Date: 09/06/2018 Carotid Arterial Duplex Study Indications: CVA. Limitations: thick neck Performing Technologist: June Leap RDMS, RVT  Examination Guidelines: A complete evaluation includes B-mode imaging, spectral Doppler, color Doppler, and power Doppler as  needed of all accessible portions of each vessel. Bilateral testing is considered an integral part of a complete examination. Limited examinations for reoccurring indications may be performed as noted.  Right Carotid Findings: +----------+--------+--------+--------+--------+------------------+           PSV cm/sEDV cm/sStenosisDescribeComments           +----------+--------+--------+--------+--------+------------------+ CCA Prox  54      14                                         +----------+--------+--------+--------+--------+------------------+ CCA Distal45      4                                          +----------+--------+--------+--------+--------+------------------+ ICA Prox  27      10      1-39%           intimal thickening +----------+--------+--------+--------+--------+------------------+ ICA Distal92      25                                         +----------+--------+--------+--------+--------+------------------+ ECA       68      7                                          +----------+--------+--------+--------+--------+------------------+ +----------+--------+-------+----------------+-------------------+           PSV cm/sEDV cmsDescribe        Arm Pressure (mmHG) +----------+--------+-------+----------------+-------------------+ [id number]            Multiphasic, WNL                    +----------+--------+-------+----------------+-------------------+ +---------+--------+--+--------+--+---------+ VertebralPSV cm/s60EDV cm/s17Antegrade +---------+--------+--+--------+--+---------+  Left Carotid Findings: +----------+--------+--------+--------+--------+------------------+           PSV cm/sEDV cm/sStenosisDescribeComments           +----------+--------+--------+--------+--------+------------------+ CCA Prox  55      14                                         +----------+--------+--------+--------+--------+------------------+  CCA Distal59      11                                         +----------+--------+--------+--------+--------+------------------+  ICA Prox  55      16      1-39%           intimal thickening +----------+--------+--------+--------+--------+------------------+ ICA Distal137     38                                         +----------+--------+--------+--------+--------+------------------+ ECA       54      8                                          +----------+--------+--------+--------+--------+------------------+ +----------+--------+--------+----------------+-------------------+ SubclavianPSV cm/sEDV cm/sDescribe        Arm Pressure (mmHG) +----------+--------+--------+----------------+-------------------+           144             Multiphasic, WNL                    +----------+--------+--------+----------------+-------------------+ +---------+--------+--+--------+--+---------+ VertebralPSV cm/s56EDV cm/s19Antegrade +---------+--------+--+--------+--+---------+  Summary: Right Carotid: Velocities in the right ICA are consistent with a 1-39% stenosis. Left Carotid: Velocities in the left ICA are consistent with a 1-39% stenosis. Vertebrals: Bilateral vertebral arteries demonstrate antegrade flow. *See table(s) above for measurements and observations.     Preliminary    Vas [place] Lower Extremity Venous (dvt)  Result Date: 09/06/2018  Lower Venous Study Indications: Edema.  Limitations: Body habitus and poor ultrasound/tissue interface. Performing Technologist: June Leap RDMS, RVT  Examination Guidelines: A complete evaluation includes B-mode imaging, spectral Doppler, color Doppler, and power Doppler as needed of all accessible portions of each vessel. Bilateral testing is considered an integral part of a complete examination. Limited examinations for reoccurring indications may be performed as noted.  Right Venous Findings:  +---------+---------------+---------+-----------+----------+--------------+          CompressibilityPhasicitySpontaneityPropertiesSummary        +---------+---------------+---------+-----------+----------+--------------+ CFV      Full           Yes      Yes                                 +---------+---------------+---------+-----------+----------+--------------+ SFJ      Full                                                        +---------+---------------+---------+-----------+----------+--------------+ FV Prox  Full                                                        +---------+---------------+---------+-----------+----------+--------------+ FV Mid   Full                                                        +---------+---------------+---------+-----------+----------+--------------+ FV DistalFull                                                        +---------+---------------+---------+-----------+----------+--------------+  PFV      Full                                                        +---------+---------------+---------+-----------+----------+--------------+ POP      Full           Yes      Yes                                 +---------+---------------+---------+-----------+----------+--------------+ PTV                                                   Not visualized +---------+---------------+---------+-----------+----------+--------------+ PERO                                                  Not visualized +---------+---------------+---------+-----------+----------+--------------+  Left Venous Findings: +---------+---------------+---------+-----------+----------+--------------+          CompressibilityPhasicitySpontaneityPropertiesSummary        +---------+---------------+---------+-----------+----------+--------------+ CFV      Full           Yes      Yes                                  +---------+---------------+---------+-----------+----------+--------------+ SFJ      Full                                                        +---------+---------------+---------+-----------+----------+--------------+ FV Prox  Full                                                        +---------+---------------+---------+-----------+----------+--------------+ FV Mid   Full                                                        +---------+---------------+---------+-----------+----------+--------------+ FV DistalFull                                                        +---------+---------------+---------+-----------+----------+--------------+ PFV      Full                                                        +---------+---------------+---------+-----------+----------+--------------+  POP      Full           Yes      Yes                                 +---------+---------------+---------+-----------+----------+--------------+ PTV                                                   Not visualized +---------+---------------+---------+-----------+----------+--------------+ PERO                                                  Not visualized +---------+---------------+---------+-----------+----------+--------------+    Summary: Right: There is no evidence of deep vein thrombosis in the lower extremity. However, portions of this examination were limited- see technologist comments above. No cystic structure found in the popliteal fossa. Left: There is no evidence of deep vein thrombosis in the lower extremity. However, portions of this examination were limited- see technologist comments above. No cystic structure found in the popliteal fossa.  *See table(s) above for measurements and observations. Electronically signed by Deitra Mayo MD on 09/06/2018 at 8:03:08 PM.    Final         Scheduled Meds: . aspirin  300 mg Rectal Daily   Or  . aspirin   325 mg Oral Daily  . budesonide (PULMICORT) nebulizer solution  0.5 mg Nebulization BID  . enoxaparin (LOVENOX) injection  40 mg Subcutaneous Q24H  . ipratropium-albuterol  3 mL Nebulization TID   Continuous Infusions:   LOS: 1 day    Time spent: 35 minutes.    Dana Allan, MD  [facility] Pager #: [phone] 7PM-7AM contact night coverage as above

## 2018-09-07 NOTE — Evaluation (Signed)
Speech Language Pathology Evaluation Patient Details Name: Zyionna Pesce MRN: [id number] DOB: 24-Feb-1951 Today's Date: 09/07/2018 Time: 9381-0175 SLP Time Calculation (min) (ACUTE ONLY): 20 min  Problem List:  Patient Active Problem List   Diagnosis Date Noted  . Closed head injury with concussion 09/06/2018  . Acute CVA (cerebrovascular accident) ([place]) 09/06/2018  . Hypokalemia 09/06/2018  . Hypomagnesemia 09/06/2018  . Asthma, chronic obstructive, with acute exacerbation ([place]) 08/02/2018  . Plantar fasciitis 04/14/2017  . Microcytosis 11/28/2015  . 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 Onychomycosis 01/09/2015  . Ingrown nail 01/09/2015  . Pain in lower limb 01/09/2015  . Chronic respiratory failure with hypoxia and hypercapnia ([place]) 07/19/2014  . CHF (congestive heart failure) ([place]) 06/11/2014  . Anemia, iron deficiency 05/27/2014  . Acute gouty arthritis 05/25/2014  . Acute renal failure superimposed on stage 3 chronic kidney disease ([place]) 05/25/2014  . Obstructive sleep apnea 05/25/2014  . Joint pain 05/24/2014  . Chronic diastolic CHF (congestive heart failure) ([place])   . Sarcoidosis   . Metabolic syndrome 05/13/8526  . Asthma, chronic 04/27/2014  . Anemia of chronic disease 04/19/2014  . Morbid (severe) obesity due to excess calories ([place]) 04/18/2014  . Immunization due 04/18/2014  . Need for prophylactic vaccination and inoculation against influenza 04/18/2014  . Prediabetes 04/13/2014  . Essential hypertension 04/13/2014  . Lower extremity edema 04/13/2014  . History of lung cancer 04/13/2014   Past Medical History:  Past Medical History:  Diagnosis Date  . Arthritis   . Asthma   . Cancer ([place])    lung, adenocarcinoma right lung 2012  . CHF (congestive heart failure) ([facility])    Preserved EF  . Congenital single kidney    With chronic kidney disease  . COPD (chronic obstructive pulmonary disease) ([place])   . Gout   . Hypertension   . Lung cancer [facility]) 2012   Right  upper lobe lung adenocarcinoma diagnosed with needle biopsy treated by SBRT finished treatment April 2013 has been monitored since  . Mass of chest wall, right    Right chest wall mass 7.3 cm biopsy on 12/13/2013. Patient notes it was consistent with sarcoidosis but actual pathology results not available.  . Oxygen deficiency   . Sarcoidosis   . Sickle cell trait ([facility])   . Sleep apnea    Past Surgical History:  Past Surgical History:  Procedure Laterality Date  . LUNG BIOPSY    . TUBAL LIGATION    . VEIN LIGATION AND STRIPPING     HPI:  Pt is a 68 y/o female admitted from home after being found down with AMS and a hematoma on her left forehead. MRI revealed bilateral thalamic infarcts. PMH including but not limited to COPD, asthma, lung cancer, HTN, CHF and sarcoidosis.   Assessment / Plan / Recommendation Clinical Impression    Pt presents with mild, higher level cognitive deficits characterized by impaired executive functioning, specifically related to thought and task organization.  Pt lived with family but is home alone during the day and was responsible for managing her medications and finances.  As a result, pt would benefit from skilled ST while inpatient for ongoing diagnostic treatment of higher level tasks; however, I suspect that pt is near her baseline for cognition given reports of previous level of function and do not anticipate ST follow up needs post discharge.      SLP Assessment  SLP Recommendation/Assessment: Patient needs continued Speech Lanaguage Pathology Services SLP Visit Diagnosis: Cognitive communication deficit (R41.841)  Follow Up Recommendations  None    Frequency and Duration min 1 x/week         SLP Evaluation Cognition  Overall Cognitive Status: Impaired/Different from baseline(mild, higher level ) Arousal/Alertness: Awake/alert Orientation Level: Oriented X4 Attention: Sustained Sustained Attention: Appears intact Memory: Impaired Memory  Impairment: Retrieval deficit Awareness: Appears intact Problem Solving: Impaired Problem Solving Impairment: Functional complex Executive Function: Writer: Impaired Organizing Impairment: Functional complex Behaviors: Impulsive Safety/Judgment: Appears intact       Comprehension  Auditory Comprehension Overall Auditory Comprehension: Appears within functional limits for tasks assessed    Expression Expression Primary Mode of Expression: Verbal Verbal Expression Overall Verbal Expression: Appears within functional limits for tasks assessed Written Expression Dominant Hand: Left   Oral / Motor  Oral Motor/Sensory Function Overall Oral Motor/Sensory Function: Within functional limits Motor Speech Overall Motor Speech: Appears within functional limits for tasks assessed   GO                    Reegan Mctighe, Selinda Orion 09/07/2018, 1:24 PM

## 2018-09-07 NOTE — Plan of Care (Signed)
  Problem: Education: Goal: Knowledge of General Education information will improve Description Including pain rating scale, medication(s)/side effects and non-pharmacologic comfort measures Outcome: Progressing Note:  POC reviewed with pt.   

## 2018-09-07 NOTE — Evaluation (Signed)
Occupational Therapy Evaluation Patient Details Name: Pamela [name] MRN: [id number] DOB: April 10, 1951 Today's Date: 09/07/2018    History of Present Illness Pt is a 68 y/o female admitted from home after being found down with AMS and a hematoma on her left forehead. MRI revealed bilateral thalamic infarcts. PMH including but not limited to COPD, asthma, lung cancer, HTN, CHF and sarcoidosis.   Clinical Impression   Patient presenting with decreased I in self care, balance, functional mobility/transfers, safety awareness, endurance, and strength. Patient reports being mod I  PTA with use of cane. She lives with son and his family but no one home during the day. OT recommended she wait until family present for things such as showering secondary to high fall risk.  Patient currently functioning at min A for mobility with RW and min - mod A for self care.. Patient will benefit from acute OT to increase overall independence in the areas of ADLs, functional mobility, and safety awareness in order to safely discharge to next venue of care.    Follow Up Recommendations  Home health OT;Supervision/Assistance - 24 hour    Equipment Recommendations  Tub/shower seat       Precautions / Restrictions Precautions Precautions: Fall Restrictions Weight Bearing Restrictions: No      Mobility Bed Mobility Overal bed mobility: Needs Assistance Bed Mobility: Supine to Sit     Supine to sit: Min guard     General bed mobility comments: greatly increased time and effort required, HOB elevated, use of bed rails, min guard for safety  Transfers Overall transfer level: Needs assistance Equipment used: Rolling walker (2 wheeled) Transfers: Sit to/from Stand Sit to Stand: Min guard         General transfer comment: increased time and effort, use of momentum, cueing for safe hand placement, min guard for safety    Balance Overall balance assessment: Needs assistance Sitting-balance support: Feet  supported Sitting balance-Leahy Scale: Fair     Standing balance support: During functional activity;No upper extremity supported;Single extremity supported;Bilateral upper extremity supported Standing balance-Leahy Scale: Poor Standing balance comment: pt able to stand at sink to perform ADL/grooming tasks with and without UE supports; however, using trunk against sink for stabilization      ADL either performed or assessed with clinical judgement   ADL Overall ADL's : Needs assistance/impaired     Grooming: Wash/dry hands;Wash/dry face;Supervision/safety;Min guard;Standing   Upper Body Bathing: Set up;Sitting   Lower Body Bathing: Minimal assistance;Moderate assistance;Sit to/from stand   Upper Body Dressing : Minimal assistance   Lower Body Dressing: Moderate assistance;Sit to/from stand   Toilet Transfer: Minimal assistance;RW   Toileting- Clothing Manipulation and Hygiene: Min guard;Sit to/from stand               Vision Baseline Vision/History: Wears glasses Wears Glasses: At all times Patient Visual Report: No change from baseline              Pertinent Vitals/Pain Pain Assessment: 0-10 Pain Score: 8  Pain Location: R knee Pain Descriptors / Indicators: Sore Pain Intervention(s): Monitored during session;Repositioned     Hand Dominance Left   Extremity/Trunk Assessment Upper Extremity Assessment Upper Extremity Assessment: Generalized weakness   Lower Extremity Assessment Lower Extremity Assessment: Generalized weakness       Communication Communication Communication: No difficulties   Cognition Arousal/Alertness: Awake/alert Behavior During Therapy: WFL for tasks assessed/performed Overall Cognitive Status: Within Functional Limits for tasks assessed  Home Living Family/patient expects to be discharged to:: Private residence Living Arrangements: Children Available Help at Discharge: Family;Available  PRN/intermittently Type of Home: House Home Access: Stairs to enter Entrance Stairs-Number of Steps: 1   Home Layout: Able to live on main level with bedroom/bathroom;Other (Comment)     Bathroom Shower/Tub: Occupational psychologist: Handicapped height     Home Equipment: [employer] - single point;Walker - 2 wheels          Prior Functioning/Environment Level of Independence: Independent with assistive device(s)        Comments: ambulates with a cane, bathes and dresses herself, uses [employer] for transportation        OT Problem List: Decreased strength;Decreased knowledge of use of DME or AE;Decreased activity tolerance;Impaired balance (sitting and/or standing);Decreased safety awareness;Pain      OT Treatment/Interventions: Self-care/ADL training;Balance training;Therapeutic exercise;Neuromuscular education;Therapeutic activities;Energy conservation;DME and/or AE instruction;Manual therapy;Patient/family education    OT Goals(Current goals can be found in the care plan section) Acute Rehab OT Goals Patient Stated Goal: to get stronger OT Goal Formulation: With patient Time For Goal Achievement: 09/21/18 Potential to Achieve Goals: Good ADL Goals Pt Will Perform Grooming: with modified independence Pt Will Perform Upper Body Bathing: with modified independence Pt Will Perform Lower Body Bathing: with modified independence Pt Will Perform Upper Body Dressing: with modified independence Pt Will Perform Lower Body Dressing: with modified independence Pt Will Transfer to Toilet: with modified independence Pt Will Perform Toileting - Clothing Manipulation and hygiene: with modified independence Pt Will Perform Tub/Shower Transfer: with supervision  OT Frequency: Min 2X/week   Barriers to D/C: Other (comment)  none known at this time       Co-evaluation PT/OT/SLP Co-Evaluation/Treatment: Yes Reason for Co-Treatment: To address functional/ADL transfers PT goals  addressed during session: Mobility/safety with mobility;Balance;Proper use of DME;Strengthening/ROM OT goals addressed during session: ADL's and self-care;Proper use of Adaptive equipment and DME      AM-PAC OT "6 Clicks" Daily Activity     Outcome Measure Help from another person eating meals?: None Help from another person taking care of personal grooming?: A Little Help from another person toileting, which includes using toliet, bedpan, or urinal?: A Little Help from another person bathing (including washing, rinsing, drying)?: A Little Help from another person to put on and taking off regular upper body clothing?: A Little Help from another person to put on and taking off regular lower body clothing?: A Lot 6 Click Score: 18   End of Session Equipment Utilized During Treatment: Rolling walker Nurse Communication: Mobility status  Activity Tolerance: Patient tolerated treatment well Patient left: in chair;with call bell/phone within reach;with chair alarm set  OT Visit Diagnosis: History of falling (Z91.81);Muscle weakness (generalized) (M62.81)                Time: 1059-1130 OT Time Calculation (min): 31 min Charges:  OT General Charges $OT Visit: 1 Visit OT Evaluation $OT Eval Low Complexity: 1 Low  Hani Campusano P, MS, OTR/L 09/07/2018, 1:04 PM

## 2018-09-07 NOTE — H&P (View-Only) (Signed)
STROKE TEAM PROGRESS NOTE   INTERVAL HISTORY No family is at the bedside.  Neurologically stable since yesterday.  She has no memory deficits noted on testing.testing clinical presentation. She said she just felt suddenly face forward and hurt her 400 and left eye and has no memory for several hours to finish the hospital. She had no focal neurological deficits when she woke up. MRI shows bilateral anterior medial thalamic infarcts  Vitals:   09/07/18 0126 09/07/18 0325 09/07/18 0721 09/07/18 0819  BP:  (!) 97/47 (!) 115/55   Pulse:   71   Resp:  16 19   Temp:  97.9 F (36.6 C) 98.9 F (37.2 C)   TempSrc:  Axillary Oral   SpO2: 99% 92% 98% 100%  Weight:      Height:        CBC:  Recent Labs  Lab 09/01/18 1250 09/06/18 0548 09/06/18 0556  WBC 4.6  --  4.5  NEUTROABS 2.2  --  2.1  HGB 12.0 10.9* 11.2*  HCT 39.9 32.0* 38.4  MCV 73.6*  --  72.7*  PLT 177  --  376    Basic Metabolic Panel:  Recent Labs  Lab 09/01/18 1250  09/06/18 0548 09/06/18 0556 09/06/18 0825  NA 140  --  140 142  --   K 4.3   < > 4.0 3.3*  --   CL 103  --   --  103  --   CO2 28  --   --  27  --   GLUCOSE 98  --   --  112*  --   BUN 23  --   --  25*  --   CREATININE 1.61*  --   --  1.97*  --   CALCIUM 8.7*  --   --  8.4*  --   MG  --   --   --   --  1.6*  PHOS  --   --   --   --  4.1   < > = values in this interval not displayed.   Lipid Panel:     Component Value Date/Time   CHOL 157 09/07/2018 0416   TRIG 104 09/07/2018 0416   HDL 41 09/07/2018 0416   CHOLHDL 3.8 09/07/2018 0416   VLDL 21 09/07/2018 0416   LDLCALC 95 09/07/2018 0416   HgbA1c:  Lab Results  Component Value Date   HGBA1C 5.5 09/01/2018   Urine Drug Screen:     Component Value Date/Time   LABOPIA NONE DETECTED 09/06/2018 0616   COCAINSCRNUR NONE DETECTED 09/06/2018 0616   LABBENZ NONE DETECTED 09/06/2018 0616   AMPHETMU NONE DETECTED 09/06/2018 0616   THCU NONE DETECTED 09/06/2018 0616   LABBARB NONE DETECTED  09/06/2018 0616    Alcohol Level     Component Value Date/Time   ETH <10 09/06/2018 0825    IMAGING Ct Head Wo Contrast  Result Date: 09/06/2018 CLINICAL DATA:  Posttraumatic altered mental state status EXAM: CT HEAD WITHOUT CONTRAST CT CERVICAL SPINE WITHOUT CONTRAST TECHNIQUE: Multidetector CT imaging of the head and cervical spine was performed following the standard protocol without intravenous contrast. Multiplanar CT image reconstructions of the cervical spine were also generated. COMPARISON:  Brain MRI 03/01/2018 FINDINGS: CT HEAD FINDINGS Brain: No evidence of acute infarction, hemorrhage, hydrocephalus, extra-axial collection or mass lesion/mass effect. Vascular: No hyperdense vessel or unexpected calcification. Skull: Large left frontal scalp hematoma. No underlying calvarial fracture. Sinuses/Orbits: Negative Other: Motion degraded such that repeat images  were needed. CT CERVICAL SPINE FINDINGS Alignment: Normal Skull base and vertebrae: No acute fracture. No primary bone lesion or focal pathologic process. Soft tissues and spinal canal: No prevertebral fluid or swelling. No visible canal hematoma. Disc levels:  No significant degenerative change Upper chest: Negative. IMPRESSION: 1. No evidence of intracranial or cervical spine injury. 2. Scalp hematoma without calvarial fracture. Electronically Signed   By: Monte Fantasia M.D.   On: 09/06/2018 06:23   Ct Cervical Spine Wo Contrast  Result Date: 09/06/2018 CLINICAL DATA:  Posttraumatic altered mental state status EXAM: CT HEAD WITHOUT CONTRAST CT CERVICAL SPINE WITHOUT CONTRAST TECHNIQUE: Multidetector CT imaging of the head and cervical spine was performed following the standard protocol without intravenous contrast. Multiplanar CT image reconstructions of the cervical spine were also generated. COMPARISON:  Brain MRI 03/01/2018 FINDINGS: CT HEAD FINDINGS Brain: No evidence of acute infarction, hemorrhage, hydrocephalus, extra-axial  collection or mass lesion/mass effect. Vascular: No hyperdense vessel or unexpected calcification. Skull: Large left frontal scalp hematoma. No underlying calvarial fracture. Sinuses/Orbits: Negative Other: Motion degraded such that repeat images were needed. CT CERVICAL SPINE FINDINGS Alignment: Normal Skull base and vertebrae: No acute fracture. No primary bone lesion or focal pathologic process. Soft tissues and spinal canal: No prevertebral fluid or swelling. No visible canal hematoma. Disc levels:  No significant degenerative change Upper chest: Negative. IMPRESSION: 1. No evidence of intracranial or cervical spine injury. 2. Scalp hematoma without calvarial fracture. Electronically Signed   By: Monte Fantasia M.D.   On: 09/06/2018 06:23   Mr Jodene Nam Head Wo Contrast  Result Date: 09/06/2018 CLINICAL DATA:  Followup thalamic stroke. EXAM: MRA HEAD WITHOUT CONTRAST TECHNIQUE: Angiographic images of the Circle of Willis were obtained using MRA technique without intravenous contrast. COMPARISON:  MRI same day. FINDINGS: Both internal carotid arteries are widely patent into the brain. No siphon stenosis. The anterior and middle cerebral vessels are patent without proximal stenosis, aneurysm or vascular malformation. Both vertebral arteries are widely patent to the basilar. No basilar stenosis. Posterior circulation branch vessels appear normal. IMPRESSION: Normal examination. Specifically, no posterior circulation or basilar tip abnormality seen. Electronically Signed   By: Nelson Chimes M.D.   On: 09/06/2018 14:02   Mr Brain Wo Contrast  Result Date: 09/06/2018 CLINICAL DATA:  History of lung cancer and sarcoidosis. Altered mental status. Found on the floor. EXAM: MRI HEAD WITHOUT CONTRAST TECHNIQUE: Multiplanar, multiecho pulse sequences of the brain and surrounding structures were obtained without intravenous contrast. COMPARISON:  CT same day.  MRI 03/01/2018 FINDINGS: Brain: Diffusion imaging shows acute  infarction in both thalami. Each region of infarction measures about a cm in size. The brainstem is otherwise normal. There are a few old small vessel cerebellar infarctions. Cerebral hemispheres otherwise show minimal small vessel change of the white matter. No cortical or large vessel territory infarction. No mass lesion, hemorrhage, hydrocephalus or extra-axial collection. Vascular: Major vessels at the base of the brain show flow. This includes flow being apparent in the vertebrobasilar system. Skull and upper cervical spine: Negative Sinuses/Orbits: Clear/normal Other: Left forehead and frontal scalp hematoma. IMPRESSION: Acute infarction within both the right and left thalami. 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 Left forehead and frontal scalp hematoma. Electronically Signed   By: Nelson Chimes M.D.   On: 09/06/2018 10:44   Dg Chest Portable 1 View  Result Date: 09/06/2018 CLINICAL DATA:  Altered mental  status EXAM: PORTABLE CHEST 1 VIEW COMPARISON:  Five days ago FINDINGS: Low volume chest. There is no edema, consolidation, effusion, or pneumothorax. Linear atelectasis in the right mid lung. Borderline heart size accentuated by low volumes. IMPRESSION: Low volume chest without acute finding. Electronically Signed   By: Monte Fantasia M.D.   On: 09/06/2018 06:38   Dg Knee Right Port  Result Date: 09/06/2018 CLINICAL DATA:  Fall with right knee bruising EXAM: PORTABLE RIGHT KNEE - 1-2 VIEW COMPARISON:  None. FINDINGS: Anterior soft tissue swelling. Chronic fragmentation or articular bodies above the patella. There is tricompartmental degenerative spurring. No acute fracture or subluxation. IMPRESSION: Soft tissue swelling without acute fracture. Electronically Signed   By: Monte Fantasia M.D.   On: 09/06/2018 06:39   Vas US Carotid (at [place] Only)  Result Date: 09/06/2018 Carotid Arterial Duplex  Study Indications: CVA. Limitations: thick neck Performing Technologist: June Leap RDMS, RVT  Examination Guidelines: A complete evaluation includes B-mode imaging, spectral Doppler, color Doppler, and power Doppler as needed of all accessible portions of each vessel. Bilateral testing is considered an integral part of a complete examination. Limited examinations for reoccurring indications may be performed as noted.  Right Carotid Findings: +----------+--------+--------+--------+--------+------------------+           PSV cm/sEDV cm/sStenosisDescribeComments           +----------+--------+--------+--------+--------+------------------+ CCA Prox  54      14                                         +----------+--------+--------+--------+--------+------------------+ CCA Distal45      4                                          +----------+--------+--------+--------+--------+------------------+ ICA Prox  27      10      1-39%           intimal thickening +----------+--------+--------+--------+--------+------------------+ ICA Distal92      25                                         +----------+--------+--------+--------+--------+------------------+ ECA       68      7                                          +----------+--------+--------+--------+--------+------------------+ +----------+--------+-------+----------------+-------------------+           PSV cm/sEDV cmsDescribe        Arm Pressure (mmHG) +----------+--------+-------+----------------+-------------------+ [id number]            Multiphasic, WNL                    +----------+--------+-------+----------------+-------------------+ +---------+--------+--+--------+--+---------+ VertebralPSV cm/s60EDV cm/s17Antegrade +---------+--------+--+--------+--+---------+  Left Carotid Findings: +----------+--------+--------+--------+--------+------------------+           PSV cm/sEDV  cm/sStenosisDescribeComments           +----------+--------+--------+--------+--------+------------------+ CCA Prox  55      14                                         +----------+--------+--------+--------+--------+------------------+  CCA Distal59      11                                         +----------+--------+--------+--------+--------+------------------+ ICA Prox  55      16      1-39%           intimal thickening +----------+--------+--------+--------+--------+------------------+ ICA Distal137     38                                         +----------+--------+--------+--------+--------+------------------+ ECA       54      8                                          +----------+--------+--------+--------+--------+------------------+ +----------+--------+--------+----------------+-------------------+ SubclavianPSV cm/sEDV cm/sDescribe        Arm Pressure (mmHG) +----------+--------+--------+----------------+-------------------+           144             Multiphasic, WNL                    +----------+--------+--------+----------------+-------------------+ +---------+--------+--+--------+--+---------+ VertebralPSV cm/s56EDV cm/s19Antegrade +---------+--------+--+--------+--+---------+  Summary: Right Carotid: Velocities in the right ICA are consistent with a 1-39% stenosis. Left Carotid: Velocities in the left ICA are consistent with a 1-39% stenosis. Vertebrals: Bilateral vertebral arteries demonstrate antegrade flow. *See table(s) above for measurements and observations.     Preliminary    Vas [place] Lower Extremity Venous (dvt)  Result Date: 09/06/2018  Lower Venous Study Indications: Edema.  Limitations: Body habitus and poor ultrasound/tissue interface. Performing Technologist: June Leap RDMS, RVT  Examination Guidelines: A complete evaluation includes B-mode imaging, spectral Doppler, color Doppler, and power Doppler as needed of all accessible  portions of each vessel. Bilateral testing is considered an integral part of a complete examination. Limited examinations for reoccurring indications may be performed as noted.  Right Venous Findings: +---------+---------------+---------+-----------+----------+--------------+          CompressibilityPhasicitySpontaneityPropertiesSummary        +---------+---------------+---------+-----------+----------+--------------+ CFV      Full           Yes      Yes                                 +---------+---------------+---------+-----------+----------+--------------+ SFJ      Full                                                        +---------+---------------+---------+-----------+----------+--------------+ FV Prox  Full                                                        +---------+---------------+---------+-----------+----------+--------------+ FV Mid   Full                                                        +---------+---------------+---------+-----------+----------+--------------+  FV DistalFull                                                        +---------+---------------+---------+-----------+----------+--------------+ PFV      Full                                                        +---------+---------------+---------+-----------+----------+--------------+ POP      Full           Yes      Yes                                 +---------+---------------+---------+-----------+----------+--------------+ PTV                                                   Not visualized +---------+---------------+---------+-----------+----------+--------------+ PERO                                                  Not visualized +---------+---------------+---------+-----------+----------+--------------+  Left Venous Findings: +---------+---------------+---------+-----------+----------+--------------+           CompressibilityPhasicitySpontaneityPropertiesSummary        +---------+---------------+---------+-----------+----------+--------------+ CFV      Full           Yes      Yes                                 +---------+---------------+---------+-----------+----------+--------------+ SFJ      Full                                                        +---------+---------------+---------+-----------+----------+--------------+ FV Prox  Full                                                        +---------+---------------+---------+-----------+----------+--------------+ FV Mid   Full                                                        +---------+---------------+---------+-----------+----------+--------------+ FV DistalFull                                                        +---------+---------------+---------+-----------+----------+--------------+  PFV      Full                                                        +---------+---------------+---------+-----------+----------+--------------+ POP      Full           Yes      Yes                                 +---------+---------------+---------+-----------+----------+--------------+ PTV                                                   Not visualized +---------+---------------+---------+-----------+----------+--------------+ PERO                                                  Not visualized +---------+---------------+---------+-----------+----------+--------------+    Summary: Right: There is no evidence of deep vein thrombosis in the lower extremity. However, portions of this examination were limited- see technologist comments above. No cystic structure found in the popliteal fossa. Left: There is no evidence of deep vein thrombosis in the lower extremity. However, portions of this examination were limited- see technologist comments above. No cystic structure found in the popliteal fossa.  *See  table(s) above for measurements and observations. Electronically signed by Deitra Mayo MD on 09/06/2018 at 8:03:08 PM.    Final     PHYSICAL EXAM Obese middle-aged African-American lady currently not in distress. . Afebrile. Head is nontraumatic. Neck is supple without bruit.    Cardiac exam no murmur or gallop. Lungs are clear to auscultation. Distal pulses are well felt. Neurological Exam ;  Awake  Alert oriented x 3. Normal speech and language.diminished recall 2/3. Able to name 9 animals with four legs.eye movements full without nystagmus.fundi were not visualized. Vision acuity and fields appear normal. Hearing is normal. Palatal movements are normal. Face symmetric. Tongue midline. Normal strength, tone, reflexes and coordination. Normal sensation. Gait deferred.  ASSESSMENT/PLAN [name] is a 68 y.o. female with history of of sleep apnea, sickle cell trait, sarcoidosis, lung cancer, hypertension, CHF and asthma presenting with altered mental status following fall during the night associated with dizziness, blurry vision.   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 Scalp hematoma  MRI R & L thalamic infarcts both at the midline.  Left forehead and frontal scalp hematoma  MRA normal.  No basilar tip abnormality  Carotid Doppler  B ICA 1-39% stenosis, VAs antegrade   2D Echo  EF > 65%. No source of embolus   LE doppler no DVT  TEE to look for embolic source. Arranged with [facility] for tomorrow.  If positive for PFO (patent foramen ovale), check bilateral lower extremity venous dopplers to rule out DVT as possible source of stroke. (I have made patient NPO after midnight tonight).  If TEE negative, a [place] electrophysiologist will consult and consider placement of an  implantable loop recorder to evaluate for atrial fibrillation as etiology of stroke. This has been explained to patient/family by Dr. Leonie Man and they are agreeable.   LDL 95  HgbA1c pending   Lovenox 40 mg sq daily for VTE prophylaxis  aspirin 81 mg daily prior to admission, now on aspirin 325 mg daily. Given eyelid bruising and additional bruising, will not given DAPT. Continue aspirin 325 mg at d/c  Therapy recommendations:  pending   Disposition:  pending   Hypertension  Stable . Permissive hypertension (OK if < 220/120) but gradually normalize in 5-7 days . Long-term BP goal normotensive  Hyperlipidemia  Home meds: No statin  LDL 95, goal < 70  Add statin - lipitor 40  Continue statin at discharge  Pre Diabetes   HgbA1c pending , goal < 7.0  Other Stroke Risk Factors  Advanced age  Morbid obesity, Body mass index is 62.06 kg/m., recommend weight loss, diet and exercise as appropriate   Obstructive sleep apnea, not on CPAP at home  Congestive heart failure  Sickle cell trait  Sarcoidosis   Other Active Problems  Hx lung cancer R lung 2012  Hypokalemia  Hypomagnesemia  Hospital day # [place], MSN, APRN, ANVP-BC, AGPCNP-BC Advanced Practice Stroke Nurse [place] for Schedule & Pager information 09/07/2018 2:42 PM  I have personally obtained history,examined this patient, reviewed notes, independently viewed imaging studies, participated in medical decision making and plan of care.ROS completed by me personally and pertinent positives fully documented  I have made any additions or clarifications directly to the above note. Agree with note above.  He presented with sudden onset of loss of consciousness and fall without any aura resulting in facial contusion and brief loss of consciousness and short-term memory loss but no other focal deficits. 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 Recommend check TEE and loop recorder and cardiac monitoring. Also check EEG. Aspirin for now. Greater than 50% time during this 35 minute visit was spent on counseling and coordination of care note for embolic stroke and answering questions  Antony Contras, MD Medical Director [name] Pager: [phone] 09/07/2018 2:57 PM  To contact Stroke Continuity provider, please refer to [email or web address] After hours, contact General Neurology

## 2018-09-07 NOTE — Progress Notes (Signed)
EEG completed, results pending. 

## 2018-09-07 NOTE — Evaluation (Signed)
Physical Therapy Evaluation Patient Details Name: Pamela [name] MRN: [id number] DOB: 1950/12/26 Today's Date: 09/07/2018   History of Present Illness  Pt is a 68 y/o female admitted from home after being found down with AMS and a hematoma on her left forehead. MRI revealed bilateral thalamic infarcts. PMH including but not limited to COPD, asthma, lung cancer, HTN, CHF and sarcoidosis.    Clinical Impression  Pt presented supine in bed with HOB elevated, awake and willing to participate in therapy session. Prior to admission, pt reported that she ambulated with use of a cane and was independent with ADLs. She is currently at min guard overall for functional mobility with use of a RW for ambulation. Pt would continue to benefit from skilled physical therapy services at this time while admitted and after d/c to address the below listed limitations in order to improve overall safety and independence with functional mobility.  I have discussed the patient's current level of function related to mobility with the patient.  They acknowledge understanding of this and feel that she and family can provide the level of care needed at home.         Follow Up Recommendations Home health PT;Supervision/Assistance - 24 hour    Equipment Recommendations  None recommended by PT    Recommendations for Other Services       Precautions / Restrictions Precautions Precautions: Fall Restrictions Weight Bearing Restrictions: No      Mobility  Bed Mobility Overal bed mobility: Needs Assistance Bed Mobility: Supine to Sit     Supine to sit: Min guard     General bed mobility comments: greatly increased time and effort required, HOB elevated, use of bed rails, min guard for safety  Transfers Overall transfer level: Needs assistance Equipment used: Rolling walker (2 wheeled) Transfers: Sit to/from Stand Sit to Stand: Min guard         General transfer comment: increased time and effort, use  of momentum, cueing for safe hand placement, min guard for safety  Ambulation/Gait Ambulation/Gait assistance: Min guard Gait Distance (Feet): 30 Feet Assistive device: Rolling walker (2 wheeled) Gait Pattern/deviations: Step-to pattern;Step-through pattern;Decreased step length - right;Decreased step length - left;Decreased stride length;Trunk flexed Gait velocity: decreased   General Gait Details: pt with very slow, cautious gait with RW in room; pt with mild instability but no overt LOB or need for physical assistance, min guard for safety  Stairs            Wheelchair Mobility    Modified Rankin (Stroke Patients Only) Modified Rankin (Stroke Patients Only) Pre-Morbid Rankin Score: Moderate disability Modified Rankin: Moderate disability     Balance Overall balance assessment: Needs assistance Sitting-balance support: Feet supported Sitting balance-Leahy Scale: Fair     Standing balance support: During functional activity;No upper extremity supported;Single extremity supported;Bilateral upper extremity supported Standing balance-Leahy Scale: Poor Standing balance comment: pt able to stand at sink to perform ADL/grooming tasks with and without UE supports; however, using trunk against sink for stabilization                             Pertinent Vitals/Pain Pain Assessment: 0-10 Pain Score: 8  Pain Location: R knee Pain Descriptors / Indicators: Sore Pain Intervention(s): Monitored during session;Repositioned    Home Living Family/patient expects to be discharged to:: Private residence Living Arrangements: Children Available Help at Discharge: Family;Available PRN/intermittently Type of Home: House Home Access: Stairs to enter   Entrance  Stairs-Number of Steps: 1 Home Layout: Able to live on main level with bedroom/bathroom;Other (Comment)(laundry is upstairs) Home Equipment: Cane - single point;Walker - 2 wheels      Prior Function Level of  Independence: Independent with assistive device(s)         Comments: ambulates with a cane, bathes and dresses herself, uses [employer] for transportation     Hand Dominance        Extremity/Trunk Assessment   Upper Extremity Assessment Upper Extremity Assessment: Defer to OT evaluation;Overall [facility] for tasks assessed    Lower Extremity Assessment Lower Extremity Assessment: Generalized weakness       Communication   Communication: No difficulties  Cognition Arousal/Alertness: Awake/alert Behavior During Therapy: WFL for tasks assessed/performed Overall Cognitive Status: Within Functional Limits for tasks assessed                                        General Comments      Exercises     Assessment/Plan    PT Assessment Patient needs continued PT services  PT Problem List Decreased strength;Decreased balance;Decreased mobility;Decreased coordination;Decreased knowledge of use of DME;Decreased safety awareness;Decreased knowledge of precautions;Pain       PT Treatment Interventions DME instruction;Gait training;Stair training;Functional mobility training;Therapeutic exercise;Therapeutic activities;Balance training;Neuromuscular re-education;Patient/family education    PT Goals (Current goals can be found in the Care Plan section)  Acute Rehab PT Goals Patient Stated Goal: to get stronger PT Goal Formulation: With patient Time For Goal Achievement: 09/21/18 Potential to Achieve Goals: Good    Frequency Min 4X/week   Barriers to discharge        Co-evaluation PT/OT/SLP Co-Evaluation/Treatment: Yes Reason for Co-Treatment: To address functional/ADL transfers PT goals addressed during session: Mobility/safety with mobility;Balance;Proper use of DME;Strengthening/ROM         AM-PAC PT "6 Clicks" Mobility  Outcome Measure Help needed turning from your back to your side while in a flat bed without using bedrails?: A Little Help needed moving  from lying on your back to sitting on the side of a flat bed without using bedrails?: A Little Help needed moving to and from a bed to a chair (including a wheelchair)?: A Little Help needed standing up from a chair using your arms (e.g., wheelchair or bedside chair)?: None Help needed to walk in hospital room?: None Help needed climbing 3-5 steps with a railing? : A Lot 6 Click Score: 19    End of Session Equipment Utilized During Treatment: Gait belt Activity Tolerance: Patient tolerated treatment well Patient left: in chair;with call bell/phone within reach;with chair alarm set Nurse Communication: Mobility status PT Visit Diagnosis: Other abnormalities of gait and mobility (R26.89);Muscle weakness (generalized) (M62.81)    Time: 1059-1130 PT Time Calculation (min) (ACUTE ONLY): 31 min   Charges:   PT Evaluation $PT Eval Moderate Complexity: 1 Mod          Sherie Don, PT, DPT  Acute Rehabilitation Services Pager [phone] Office [place] 09/07/2018, 12:24 PM

## 2018-09-07 NOTE — Procedures (Signed)
History: 68 year old female with bilateral thalamic infarctions  Sedation: None  Technique: This is a 21 channel routine scalp EEG performed at the bedside with bipolar and monopolar montages arranged in accordance to the international 10/20 system of electrode placement. One channel was dedicated to EKG recording.    Background: The background consists of intermixed alpha and beta activities. There is a well defined posterior dominant rhythm of 10 Hz that attenuates with eye opening. Sleep is recorded with normal appearing structures.  There is some electrode artifact at 02, but this is limited to O2 and that there is a run that could be concerning, this is clearly seen to be artifact in this location at other times of the recording.  Photic stimulation: Physiologic driving is not performed  EEG Abnormalities: None  Clinical Interpretation: This normal EEG is recorded in the waking and sleep state. There was no seizure or seizure predisposition recorded on this study. Please note that lack of epileptiform activity on EEG does not preclude the possibility of epilepsy.   Roland Rack, MD [facility] [phone]  If 7pm- 7am, please page neurology on call as listed in [place].

## 2018-09-07 NOTE — Care Management Note (Signed)
Case Management Note  Patient Details  Name: Rosalyn Archambault MRN: [id number] Date of Birth: 06-14-1951  Subjective/Objective:      Pt admitted with CVA. She is from home with son, daughter in law and grandchildren. DME: cane, walker, lift chair No issues with home medications--uses [place] for her transportation.              Action/Plan: Awaiting PT/OT recommendations. CM following for d/c needs, physician orders.   Expected Discharge Date:                  Expected Discharge Plan:     In-House Referral:     Discharge planning Services     Post Acute Care Choice:    Choice offered to:     DME Arranged:    DME Agency:     HH Arranged:    HH Agency:     Status of Service:  In process, will continue to follow  If discussed at Long Length of Stay Meetings, dates discussed:    Additional Comments:  Pollie Friar, RN 09/07/2018, 11:22 AM

## 2018-09-08 ENCOUNTER — Encounter (HOSPITAL_COMMUNITY)
Admission: EM | Disposition: A | Discharge status: Home Health | Payer: Self-pay | Source: Home / Self Care | Attending: Internal Medicine

## 2018-09-08 ENCOUNTER — Inpatient Hospital Stay (HOSPITAL_COMMUNITY): Payer: Medicare Other | Admitting: Anesthesiology

## 2018-09-08 ENCOUNTER — Inpatient Hospital Stay (HOSPITAL_COMMUNITY): Payer: Medicare Other

## 2018-09-08 ENCOUNTER — Encounter (HOSPITAL_COMMUNITY): Payer: Self-pay

## 2018-09-08 DIAGNOSIS — I6389 Other cerebral infarction: Secondary | ICD-10-CM

## 2018-09-08 HISTORY — PX: LOOP RECORDER INSERTION: EP1214

## 2018-09-08 HISTORY — PX: TEE WITHOUT CARDIOVERSION: SHX5443

## 2018-09-08 LAB — HEMOGLOBIN A1C
Hgb A1c MFr Bld: 6 % — ABNORMAL HIGH (ref 4.8–5.6)
Mean Plasma Glucose: 126 mg/dL

## 2018-09-08 SURGERY — ECHOCARDIOGRAM, TRANSESOPHAGEAL
Anesthesia: Monitor Anesthesia Care

## 2018-09-08 SURGERY — LOOP RECORDER INSERTION

## 2018-09-08 MED ORDER — LIDOCAINE-EPINEPHRINE 1 %-1:100000 IJ SOLN
INTRAMUSCULAR | Status: DC | PRN
  Administered 2018-09-08: 30 mL

## 2018-09-08 MED ORDER — LIDOCAINE-EPINEPHRINE 1 %-1:100000 IJ SOLN
INTRAMUSCULAR | Status: AC
  Filled 2018-09-08: qty 1

## 2018-09-08 MED ORDER — ACETAMINOPHEN 325 MG PO TABS: 325.0000 mg | ORAL_TABLET | ORAL | Status: DC | PRN

## 2018-09-08 MED ORDER — PROPOFOL 500 MG/50ML IV EMUL
INTRAVENOUS | Status: DC | PRN
  Administered 2018-09-08: 100 ug/kg/min via INTRAVENOUS

## 2018-09-08 MED ORDER — LIDOCAINE 2% (20 MG/ML) 5 ML SYRINGE
INTRAMUSCULAR | Status: DC | PRN
  Administered 2018-09-08: 100 mg via INTRAVENOUS

## 2018-09-08 MED ORDER — ONDANSETRON HCL 4 MG/2ML IJ SOLN: 4.0000 mg | Freq: Four times a day (QID) | INTRAMUSCULAR | Status: DC | PRN

## 2018-09-08 MED ORDER — PROPOFOL 10 MG/ML IV BOLUS
INTRAVENOUS | Status: DC | PRN
  Administered 2018-09-08: 20 mg via INTRAVENOUS
  Administered 2018-09-08: 10 mg via INTRAVENOUS
  Administered 2018-09-08: 50 mg via INTRAVENOUS
  Administered 2018-09-08: 20 mg via INTRAVENOUS

## 2018-09-08 MED ORDER — SODIUM CHLORIDE 0.9 % IV SOLN
INTRAVENOUS | Status: DC
  Administered 2018-09-08: 10:00:00 via INTRAVENOUS

## 2018-09-08 SURGICAL SUPPLY — 2 items
LOOP REVEAL LINQSYS (Prosthesis & Implant Heart) ×3 IMPLANT
PACK LOOP INSERTION (CUSTOM PROCEDURE TRAY) ×3 IMPLANT

## 2018-09-08 NOTE — Transfer of Care (Signed)
Immediate Anesthesia Transfer of Care Note  Patient: Pamela [name]  Procedure(s) Performed: TRANSESOPHAGEAL ECHOCARDIOGRAM (TEE) (N/A ) BUBBLE STUDY  Patient Location: Endoscopy Unit  Anesthesia Type:MAC  Level of Consciousness: drowsy  Airway & Oxygen Therapy: Patient Spontanous Breathing and Patient connected to nasal cannula oxygen  Post-op Assessment: Report given to RN and Post -op Vital signs reviewed and stable  Post vital signs: Reviewed and stable  Last Vitals:  Vitals Value Taken Time  BP    Temp    Pulse 67 09/08/2018 10:50 AM  Resp 28 09/08/2018 10:50 AM  SpO2 93 % 09/08/2018 10:50 AM  Vitals shown include unvalidated device data.  Last Pain:  Vitals:   09/08/18 0956  TempSrc:   PainSc: 0-No pain         Complications: No apparent anesthesia complications

## 2018-09-08 NOTE — Progress Notes (Signed)
Occupational Therapy Treatment Patient Details Name: Kinzey Sheriff MRN: [id number] DOB: 1951-01-08 Today's Date: 09/08/2018    History of present illness Pt is a 68 y/o female admitted from home after being found down with AMS and a hematoma on her left forehead. MRI revealed bilateral thalamic infarcts. PMH including but not limited to COPD, asthma, lung cancer, HTN, CHF and sarcoidosis.   OT comments  Pt transitioning from SLP session without issue. Pt with c/o R knee pain and ice applied this session for pain management. Pt agreeable to B UE strengthening exercises with use of level 2 resistive band. Pt performed 2 sets of 10 chest pulls, straight arm raises, bicep curls, shoulder elevation, shoulder diagonals, and alternating punches. Pt needing frequent rest breaks and min cuing for pursed lip breathing with therapeutic exercise. Pt remained in recliner chair at end of session.   Follow Up Recommendations  Home health OT;Supervision/Assistance - 24 hour    Equipment Recommendations  Tub/shower seat       Precautions / Restrictions Precautions Precautions: Fall Restrictions Weight Bearing Restrictions: No              ADL either performed or assessed with clinical judgement        Vision Baseline Vision/History: Wears glasses Wears Glasses: At all times Patient Visual Report: No change from baseline            Cognition Arousal/Alertness: Awake/alert Behavior During Therapy: WFL for tasks assessed/performed Overall Cognitive Status: Within Functional Limits for tasks assessed                       Pertinent Vitals/ Pain       Pain Assessment: 0-10 Pain Score: 6  Pain Location: R knee Pain Descriptors / Indicators: Sore;Aching Pain Intervention(s): Monitored during session;Ice applied         Frequency  Min 2X/week        Progress Toward Goals  OT Goals(current goals can now be found in the care plan section)  Progress towards OT goals:  Progressing toward goals     Plan Discharge plan remains appropriate       AM-PAC OT "6 Clicks" Daily Activity     Outcome Measure   Help from another person eating meals?: None Help from another person taking care of personal grooming?: A Little Help from another person toileting, which includes using toliet, bedpan, or urinal?: A Little Help from another person bathing (including washing, rinsing, drying)?: A Little Help from another person to put on and taking off regular upper body clothing?: A Little Help from another person to put on and taking off regular lower body clothing?: A Lot 6 Click Score: 18    End of Session    OT Visit Diagnosis: History of falling (Z91.81);Muscle weakness (generalized) (M62.81)   Activity Tolerance Patient tolerated treatment well   Patient Left in chair;with call bell/phone within reach;with chair alarm set           Time: 0258-5277 OT Time Calculation (min): 18 min  Charges: OT General Charges $OT Visit: 1 Visit OT Treatments $Therapeutic Exercise: 8-22 mins   Armentha Branagan P, MS, OTR/L 09/08/2018, 2:20 PM

## 2018-09-08 NOTE — Anesthesia Preprocedure Evaluation (Addendum)
Anesthesia Evaluation  Patient identified by MRN, date of birth, ID band Patient awake    Reviewed: Allergy & Precautions, NPO status , Patient's Chart, lab work & pertinent test results  Airway Mallampati: I  TM Distance: >3 FB     Dental  (+) Edentulous Upper, Edentulous Lower, Dental Advisory Given   Pulmonary asthma , sleep apnea and Continuous Positive Airway Pressure Ventilation , COPD,    Pulmonary exam normal        Cardiovascular hypertension, +CHF  Normal cardiovascular exam     Neuro/Psych CVA negative psych ROS   GI/Hepatic negative GI ROS, Neg liver ROS,   Endo/Other  negative endocrine ROS  Renal/GU Renal disease  negative genitourinary   Musculoskeletal negative musculoskeletal ROS (+)   Abdominal   Peds negative pediatric ROS (+)  Hematology negative hematology ROS (+)   Anesthesia Other Findings   Reproductive/Obstetrics negative OB ROS                            Anesthesia Physical Anesthesia Plan  ASA: III  Anesthesia Plan: MAC   Post-op Pain Management:    Induction:   PONV Risk Score and Plan: 2 and Ondansetron and Propofol infusion  Airway Management Planned: Natural Airway  Additional Equipment:   Intra-op Plan:   Post-operative Plan:   Informed Consent: I have reviewed the patients History and Physical, chart, labs and discussed the procedure including the risks, benefits and alternatives for the proposed anesthesia with the patient or authorized representative who has indicated his/her understanding and acceptance.     Dental advisory given  Plan Discussed with: CRNA and Anesthesiologist  Anesthesia Plan Comments:         Anesthesia Quick Evaluation

## 2018-09-08 NOTE — Interval H&P Note (Signed)
History and Physical Interval Note:  09/08/2018 9:27 AM  Rolland Bimler  has presented today for surgery, with the diagnosis of STROKE  The various methods of treatment have been discussed with the patient and family. After consideration of risks, benefits and other options for treatment, the patient has consented to  Procedure(s) with comments: TRANSESOPHAGEAL ECHOCARDIOGRAM (TEE) (N/A) - with loop as a surgical intervention .  The patient's history has been reviewed, patient examined, no change in status, stable for surgery.  I have reviewed the patient's chart and labs.  Questions were answered to the patient's satisfaction.     Jenkins Rouge

## 2018-09-08 NOTE — Progress Notes (Addendum)
STROKE TEAM PROGRESS NOTE   INTERVAL HISTORY Patient  had TEE which showed a small PFO but no other cardiac source of embolism.. Family at bedside. Neuro stable. No further spells.   Vitals:   09/08/18 0822 09/08/18 0932 09/08/18 0956 09/08/18 1014  BP:  119/75 (!) 149/73   Pulse:  70 68   Resp:  15 18   Temp:  98.5 F (36.9 C)  98.8 F (37.1 C)  TempSrc:  Oral    SpO2: 92% 97% 97%   Weight:      Height:        CBC:  Recent Labs  Lab 09/06/18 0556 09/07/18 1140  WBC 4.5 4.1  NEUTROABS 2.1 2.2  HGB 11.2* 11.0*  HCT 38.4 37.3  MCV 72.7* 72.1*  PLT 172 144*    Basic Metabolic Panel:  Recent Labs  Lab 09/06/18 0556 09/06/18 0825 09/07/18 1140  NA 142  --  140  K 3.3*  --  4.3  CL 103  --  107  CO2 27  --  25  GLUCOSE 112*  --  99  BUN 25*  --  22  CREATININE 1.97*  --  1.60*  CALCIUM 8.4*  --  8.4*  MG  --  1.6* 1.8  PHOS  --  4.1 3.4     PHYSICAL EXAM Obese middle-aged African-American lady currently not in distress. . Afebrile. Head is nontraumatic. Neck is supple without bruit.    Cardiac exam no murmur or gallop. Lungs are clear to auscultation. Distal pulses are well felt. Neurological Exam ;  Awake  Alert oriented x 3. Normal speech and language.diminished recall 2/3. Able to name 9 animals with four legs.eye movements full without nystagmus.fundi were not visualized. Vision acuity and fields appear normal. Hearing is normal. Palatal movements are normal. Face symmetric. Tongue midline. Normal strength, tone, reflexes and coordination. Normal sensation. Gait deferred.  ASSESSMENT/PLAN Ms. Lanay Zinda is a 68 y.o. female with history of of sleep apnea, sickle cell trait, sarcoidosis, lung cancer, hypertension, CHF and asthma presenting with altered mental status following fall during the night associated with dizziness, blurry vision.   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 Scalp hematoma  MRI R & L thalamic infarcts both at the midline.  Left forehead and frontal scalp hematoma  MRA normal.  No basilar tip abnormality  Carotid Doppler  B ICA 1-39% stenosis, VAs antegrade   2D Echo  EF > 65%. No source of embolus   LE doppler no DVT  EEG neg for seizures  TEE small PFO. No cardiac source of embolism otherwise  Lower extremity venous Doppler pending   [place] electrophysiologist to place implantable loop recorder to evaluate for atrial fibrillation as etiology of stroke if TEE neg.   LDL 95  HgbA1c 6.0  Lovenox 40 mg sq daily for VTE prophylaxis  aspirin 81 mg daily prior to admission, now on aspirin 325 mg daily. Given eyelid bruising and additional bruising, will not given DAPT. Continue aspirin 325 mg at d/c  Therapy recommendations:  HH PT, HH OT  Disposition:  Return home   Hypertension  Stable . Permissive hypertension (OK if < 220/120) but gradually normalize in 5-7 days . Long-term BP goal normotensive  Hyperlipidemia  Home meds: No statin  LDL 95, goal < 70  Add statin - lipitor 40  Continue statin at discharge  Pre Diabetes   HgbA1c 6.0, goal < 7.0  Other Stroke Risk Factors  Advanced age  Morbid obesity, Body mass index is 62.06 kg/m., recommend weight loss, diet and exercise as appropriate   UDS / ETOH level negative    Obstructive sleep apnea, not on CPAP at home  Congestive heart failure  Sickle cell trait  Sarcoidosis   Other Active Problems  Hx lung cancer R lung 2012  Hypokalemia  Hypomagnesemia  Anemia, iron deficiency  Hospital day # [place], MSN, APRN, ANVP-BC, AGPCNP-BC Advanced Practice Stroke Nurse [place] for Schedule & Pager information 09/08/2018 10:22 AM  I have personally obtained history,examined this patient, reviewed notes,  independently viewed imaging studies, participated in medical decision making and plan of care.ROS completed by me personally and pertinent positives fully documented  I have made any additions or clarifications directly to the above note. Agree with note above.TEE shows a small PFO. Plan check lower extremity venous Doppler. If negative do loop recorder. Long discussion with patient and multiple family members at the bedside and with electrophysiology team physician assistant and answered questions he did greater than 50% time during this 25 minute minute visit was spent on counseling and coordination of care about her stroke and an evaluation and treatment and answering questions.  Antony Contras, MD Medical Director [facility] Stroke Center Pager: [phone] 09/08/2018 12:39 PM   To contact Stroke Continuity provider, please refer to [email or web address] After hours, contact General Neurology

## 2018-09-08 NOTE — Consult Note (Signed)
            [facility] CM Primary Care Navigator  09/08/2018  Gustavia Carie 1950-10-18 [id number]   Martin Majestic to see patient at the bedside to identify possible discharge needs but sheis off the unit to Endo for a procedure. (TEE- transesophageal echocardiogram with loop recorder insertion)  Will attempt to see patient at another time whensheis available in the room.     Addendum (09/09/18):  Went back to see patient in the room to follow-up for possible discharge needs.  Patient reportshaving a fall at home, hitting her head and altered mentation that resulted to this admission. Per MD note, patient was admitted for syncope/ fall/ CVA, undergoing stroke work-up. (cerebrovascular accident, hypertension, morbid- severe obesity due to excess calories, closed head injury with concussion, hypokalemia, hypomagnesemia)  Patient endorses Lanae Boast with [place] her[facility] care provider[facility]   [facility] [address] [facility].   Patientstatesmanaging her ownmedications at home using "pill box" system filled once a week.  Patient reports using [employer] transportation in going toher doctors'appointments without any problem.  Patient is living with son Melvia Heaps) and daughter in-law Hal Hope) who are supportive of her needs, but patient verbalized being independent with self care. Her church friend came in to visit and reports providing assistance with patient's needs as well.   Anticipated discharge plan is home with home health services per therapy recommendation.   Patient voiced understanding to call primary care provider's office whenshereturns home, for a post discharge follow-up appointment within 1- 2 weeks or sooner if needs arise. Patient letter (with PCP's contact number) was provided as a reminder.  Explained to patient about [facility] CM services available for healthmanagement  and resourcesat homebut she indicated having nopressing needs or concerns at this point.  Encouraged patient to seekreferral to [facility] care management from primary care providerif deemed necessary and appropriate forany services in the near future.  [facility] care management information provided for future needs thatshe may have  Noted order for EMMI Stroke calls already in place and patient was made aware of it.    For additional questions please contact:  Edwena Felty A. Jameson Tormey, BSN, RN-BC [facility] PRIMARY CARE Navigator Cell: [phone]

## 2018-09-08 NOTE — Progress Notes (Signed)
SLP Cancellation Note  Patient Details Name: Pamela [name] MRN: [id number] DOB: July 13, 1951   Cancelled treatment:       Reason Eval/Treat Not Completed: Patient at procedure or test/unavailable. Pt off unit for TEE; SLP will follow up.   Horton Marshall 09/08/2018, 11:07 AM

## 2018-09-08 NOTE — Progress Notes (Signed)
PROGRESS NOTE    Pamela [name]  QAS:[id number] DOB: Jan 28, 1951 DOA: 09/06/2018 PCP: Lanae Boast, FNP  Outpatient Specialists:   Brief Narrative:  Pamela [name] is a 68 y.o. female with medical history significant of osteoarthritis, asthma, lung cancer, COPD, gout, hypertension, history of right upper lobe lung adenocarcinoma, right chest wall mass, sarcoidosis, sickle cell trait, sleep apnea who was brought to the emergency department after being found on the floor altered with a hematoma on her left forehead and epistaxis from her left nostril.  MRI of the brain done revealed bilateral thalamic infarct.  Patient is currently on aspirin.  The neurology team is directing care.  09/08/2018: Patient seen.  No new complaints.  TEE reveals small PFO with right-to-left shunt.  The plan is to implant a loop recorder by the electrophysiology team.  Input from cardiology and neurology is highly appreciated.  Assessment & Plan:   Principal Problem:   Acute CVA (cerebrovascular accident) [facility]) Active Problems:   Prediabetes   Essential hypertension   Morbid (severe) obesity due to excess calories ([place])   Obstructive sleep apnea   Anemia, iron deficiency   Closed head injury with concussion   Hypokalemia   Hypomagnesemia  Acute CVA (cerebrovascular accident) ([place]) Continue aspirin.   Neurology input is highly appreciated.   PT/OT/SLP. Follow carotid Doppler. Doppler ultrasound of the legs did not reveal any DVT. Echocardiogram revealed EF of greater than 65% and aneurysmal interatrial septum. Further management will depend on hospital course. 09/08/2018: Patient seen.  No new complaints.  TEE reveals small PFO with right-to-left shunt.  The plan is to implant a loop recorder by the electrophysiology team.  Input from cardiology and neurology is highly appreciated.   Possible closed head injury with concussion: Patient's mentation has improved significantly. Continue to  monitor.  Prediabetes HbA1c is 6%.  Essential hypertension Hold antihypertensives for now. Allow permissive hypertension up to 220/120 mmHg. Monitor blood pressure closely. PRN antihypertensive for SBP over 798 or systolic over 921 mmHg. 09/08/2018: Continue to hold antihypertensives.  Morbid (severe) obesity due to excess calories ([place]) Will need significant lifestyle modifications (diet and exercise).  Obstructive sleep apnea Noncompliant.   Quit using CPAP machine.    Anemia, iron deficiency Monitor hematocrit and hemoglobin.  Hypokalemia Continue to monitor and replete.  Hypomagnesemia Continue to monitor and replete.  DVT prophylaxis: SCDs due to forehead/right knee hematomas. Code Status: Full code. Family Communication: Her son was by bedside. Disposition Plan: Admit for CVA work-up. Consults called: Neuro hospitalist service and cardiology.  Procedures:   None  Antimicrobials:   None   Subjective: No new complaints.  Objective: Vitals:   09/08/18 1014 09/08/18 1050 09/08/18 1100 09/08/18 1225  BP:  (!) 124/57 125/67 130/66  Pulse:  67 71 62  Resp:  (!) 28 20 17   Temp: 98.8 F (37.1 C) 98.7 F (37.1 C)  97.9 F (36.6 C)  TempSrc:  Oral  Oral  SpO2:  93% 95% 100%  Weight:      Height:        Intake/Output Summary (Last 24 hours) at 09/08/2018 1647 Last data filed at 09/08/2018 1051 Gross per 24 hour  Intake 640 ml  Output -  Net 640 ml   Filed Weights   09/06/18 0534 09/06/18 1719  Weight: (!) 165.1 kg (!) 164 kg    Examination:  General exam: Patient is morbidly obese.  Left frontal hematoma is noted.  Continue any obvious distress.   Respiratory system: Clear to auscultation.  Cardiovascular system: S1 & S2 heard. Gastrointestinal system: Abdomen is morbidly obese, soft and nontender.  Organs are difficult to assess.   Central nervous system: Awake and alert.  Patient moves all limbs.   Extremities: Hematoma right knee.  Data  Reviewed: I have personally reviewed following labs and imaging studies  CBC: Recent Labs  Lab 09/06/18 0548 09/06/18 0556 09/07/18 1140  WBC  --  4.5 4.1  NEUTROABS  --  2.1 2.2  HGB 10.9* 11.2* 11.0*  HCT 32.0* 38.4 37.3  MCV  --  72.7* 72.1*  PLT  --  172 536*   Basic Metabolic Panel: Recent Labs  Lab 09/05/18 1127 09/06/18 0548 09/06/18 0556 09/06/18 0825 09/07/18 1140  NA  --  140 142  --  140  K 3.2* 4.0 3.3*  --  4.3  CL  --   --  103  --  107  CO2  --   --  27  --  25  GLUCOSE  --   --  112*  --  99  BUN  --   --  25*  --  22  CREATININE  --   --  1.97*  --  1.60*  CALCIUM  --   --  8.4*  --  8.4*  MG  --   --   --  1.6* 1.8  PHOS  --   --   --  4.1 3.4   GFR: Estimated Creatinine Clearance: 54.5 mL/min (A) (by C-G formula based on SCr of 1.6 mg/dL (H)). Liver Function Tests: Recent Labs  Lab 09/06/18 0556 09/07/18 1140  AST 22  --   ALT 13  --   ALKPHOS 99  --   BILITOT 0.4  --   PROT 6.3*  --   ALBUMIN 3.1* 3.1*   No results for input(s): LIPASE, AMYLASE in the last 168 hours. Recent Labs  Lab 09/06/18 0558  AMMONIA 21   Coagulation Profile: Recent Labs  Lab 09/06/18 0556  INR 0.97   Cardiac Enzymes: No results for input(s): CKTOTAL, CKMB, CKMBINDEX, TROPONINI in the last 168 hours. BNP (last 3 results) No results for input(s): PROBNP in the last 8760 hours. HbA1C: Recent Labs    09/07/18 0416  HGBA1C 6.0*   CBG: Recent Labs  Lab 09/06/18 0537  GLUCAP 102*   Lipid Profile: Recent Labs    09/07/18 0416  CHOL 157  HDL 41  LDLCALC 95  TRIG 104  CHOLHDL 3.8   Thyroid Function Tests: No results for input(s): TSH, T4TOTAL, FREET4, T3FREE, THYROIDAB in the last 72 hours. Anemia Panel: No results for input(s): VITAMINB12, FOLATE, FERRITIN, TIBC, IRON, RETICCTPCT in the last 72 hours. Urine analysis:    Component Value Date/Time   COLORURINE YELLOW 09/06/2018 0616   APPEARANCEUR CLEAR 09/06/2018 0616   LABSPEC 1.013  09/06/2018 0616   PHURINE 5.0 09/06/2018 0616   GLUCOSEU NEGATIVE 09/06/2018 0616   HGBUR NEGATIVE 09/06/2018 0616   BILIRUBINUR NEGATIVE 09/06/2018 0616   BILIRUBINUR neg 09/01/2018 1121   KETONESUR NEGATIVE 09/06/2018 0616   PROTEINUR NEGATIVE 09/06/2018 0616   UROBILINOGEN 0.2 09/01/2018 1121   UROBILINOGEN 0.2 09/24/2017 1143   NITRITE NEGATIVE 09/06/2018 0616   LEUKOCYTESUR NEGATIVE 09/06/2018 0616   Sepsis Labs: @LABRCNTIP (procalcitonin:4,lacticidven:4)  ) Recent Results (from the past 240 hour(s))  Blood culture (routine x 2)     Status: None (Preliminary result)   Collection Time: 09/06/18  5:38 AM  Result Value Ref Range Status   Specimen Description  BLOOD LEFT WRIST  Final   Special Requests   Final    BOTTLES DRAWN AEROBIC AND ANAEROBIC Blood Culture results may not be optimal due to an inadequate volume of blood received in culture bottles   Culture   Final    NO GROWTH 2 DAYS Performed at [facility], [place] [address] [place], [place] [address]    Report Status PENDING  Incomplete  Blood culture (routine x 2)     Status: None (Preliminary result)   Collection Time: 09/06/18  6:00 AM  Result Value Ref Range Status   Specimen Description BLOOD RIGHT ANTECUBITAL  Final   Special Requests   Final    BOTTLES DRAWN AEROBIC AND ANAEROBIC Blood Culture results may not be optimal due to an inadequate volume of blood received in culture bottles   Culture   Final    NO GROWTH 2 DAYS Performed at [facility], [place] [address], [place], [place] [address]    Report Status PENDING  Incomplete         Radiology Studies: No results found.      Scheduled Meds: . [MAR Hold] aspirin  300 mg Rectal Daily   Or  . [MAR Hold] aspirin  325 mg Oral Daily  . [MAR Hold] atorvastatin  40 mg Oral q1800  . [MAR Hold] budesonide (PULMICORT) nebulizer solution  0.5 mg Nebulization BID  . [MAR Hold] enoxaparin (LOVENOX) injection  40 mg Subcutaneous Q24H  . [MAR  Hold] feeding supplement (PRO-STAT SUGAR FREE 64)  30 mL Oral BID   Continuous Infusions:   LOS: 2 days    Time spent: 25 minutes.    Dana Allan, MD  [facility] Pager #: [phone] 7PM-7AM contact night coverage as above

## 2018-09-08 NOTE — Anesthesia Postprocedure Evaluation (Signed)
Anesthesia Post Note  Patient: Elody Dion  Procedure(s) Performed: TRANSESOPHAGEAL ECHOCARDIOGRAM (TEE) (N/A ) BUBBLE STUDY     Patient location during evaluation: PACU Anesthesia Type: MAC Level of consciousness: awake and alert Pain management: pain level controlled Vital Signs Assessment: post-procedure vital signs reviewed and stable Respiratory status: spontaneous breathing and respiratory function stable Cardiovascular status: stable Postop Assessment: no apparent nausea or vomiting Anesthetic complications: no    Last Vitals:  Vitals:   09/08/18 1050 09/08/18 1100  BP: (!) 124/57 125/67  Pulse: 67 71  Resp: (!) 28 20  Temp: 37.1 C   SpO2: 93% 95%    Last Pain:  Vitals:   09/08/18 1100  TempSrc:   PainSc: 0-No pain                 Breeann Reposa DANIEL

## 2018-09-08 NOTE — Discharge Instructions (Signed)
Implant site wound care instructions Keep incision clean and dry for 3 days days. You can remove outer dressing tomorrow. Leave steri-strips (little pieces of tape) on until seen in the office for wound check appointment. Call the office [phone]) for redness, drainage, swelling, or fever.

## 2018-09-08 NOTE — Progress Notes (Signed)
  Echocardiogram Echocardiogram Transesophageal has been performed.  Pamela [name] 09/08/2018, 10:56 AM

## 2018-09-08 NOTE — CV Procedure (Signed)
See full report in [facility] Anesthesia Propofol  PFO present with positive bubble study Appears small by color and 2D No LAA thrombus TEE otherwise normal   Jenkins Rouge

## 2018-09-08 NOTE — Progress Notes (Signed)
  Speech Language Pathology Treatment: Cognitive-Linquistic  Patient Details Name: Pamela [name] MRN: [id number] DOB: 03/21/1951 Today's Date: 09/08/2018 Time: 2706-2376 SLP Time Calculation (min) (ACUTE ONLY): 25 min  Assessment / Plan / Recommendation Clinical Impression  Pt was seen for cognitive-linguistic treatment to target higher-level cognitive skills. Pt reported that she believes her cognition is now back to baseline but she recalls having difficulty yesterday. She was alert throughout the session and orientated x4. She demonstrated 100% accuracy with time management problems and 90% accuracy with a medication management (prescription) task increasing to 100% accuracy with minimal cues. She required minimal cues with money management tasks related to a menu but she indicated that never calculates how much she would be spending on various items. No deficits were noted with regards to ADL safety problems/scenarios. Considering her performance and the pt's reports, she appears to be at her baseline level of functioning with regards to cognition. Continued skilled SLP services are therefore not clinically indicated at this time.    HPI HPI: Pt is a 68 y/o female admitted from home after being found down with AMS and a hematoma on her left forehead. MRI revealed bilateral thalamic infarcts. PMH including but not limited to COPD, asthma, lung cancer, HTN, CHF and sarcoidosis.      SLP Plan  All goals met;Discharge SLP treatment due to (comment)(No further SLP services needed)       Recommendations                   Follow up Recommendations: None SLP Visit Diagnosis: Cognitive communication deficit (R41.841) Plan: All goals met;Discharge SLP treatment due to (comment)(No further SLP services needed)       Pamela [name] I. Hardin Negus, [place], [place] Office number [phone] Pager [place] 09/08/2018, 2:14  PM

## 2018-09-08 NOTE — Progress Notes (Signed)
PT Cancellation Note  Patient Details Name: Pamela [name] MRN: [id number] DOB: 1950/08/16   Cancelled Treatment:    Reason Eval/Treat Not Completed: Patient at procedure or test/unavailable. Pt off of the floor to the cath lab. PT will continue to follow pt acutely as available and appropriate.   [place] 09/08/2018, 4:07 PM

## 2018-09-09 ENCOUNTER — Encounter (HOSPITAL_COMMUNITY): Payer: Self-pay | Admitting: Cardiovascular Disease

## 2018-09-09 MED ORDER — ASPIRIN 325 MG PO TABS: 325.0000 mg | ORAL_TABLET | Freq: Every day | ORAL | 0 refills | Status: DC

## 2018-09-09 MED ORDER — DOCUSATE SODIUM 100 MG PO CAPS: 100.0000 mg | ORAL_CAPSULE | Freq: Two times a day (BID) | ORAL | 0 refills | Status: DC

## 2018-09-09 MED ORDER — ATORVASTATIN CALCIUM 40 MG PO TABS: 40.0000 mg | ORAL_TABLET | Freq: Every day | ORAL | 0 refills | Status: DC

## 2018-09-09 MED ORDER — FEBUXOSTAT 40 MG PO TABS: 40.0000 mg | ORAL_TABLET | Freq: Every day | ORAL | 0 refills | Status: DC

## 2018-09-09 MED ORDER — BUDESONIDE-FORMOTEROL FUMARATE 160-4.5 MCG/ACT IN AERO: 2.0000 | INHALATION_SPRAY | Freq: Two times a day (BID) | RESPIRATORY_TRACT | 0 refills | Status: DC

## 2018-09-09 NOTE — Progress Notes (Addendum)
STROKE TEAM PROGRESS NOTE   INTERVAL HISTORY Patient  Up in chair at bedside. Dr. Leonie Man talked to her son over the phone r/t dx and treatment at her request. Plans for OP follow up.    Vitals:   09/09/18 0309 09/09/18 0823 09/09/18 0829 09/09/18 1120  BP: 111/68 131/76  (!) 131/96  Pulse: 62 (!) 59  61  Resp: 20 18    Temp: (!) 97.4 F (36.3 C) 97.8 F (36.6 C)  97.8 F (36.6 C)  TempSrc: Oral Oral  Oral  SpO2: 97% 98% 96% 96%  Weight:      Height:        CBC:  Recent Labs  Lab 09/06/18 0556 09/07/18 1140  WBC 4.5 4.1  NEUTROABS 2.1 2.2  HGB 11.2* 11.0*  HCT 38.4 37.3  MCV 72.7* 72.1*  PLT 172 144*    Basic Metabolic Panel:  Recent Labs  Lab 09/06/18 0556 09/06/18 0825 09/07/18 1140  NA 142  --  140  K 3.3*  --  4.3  CL 103  --  107  CO2 27  --  25  GLUCOSE 112*  --  99  BUN 25*  --  22  CREATININE 1.97*  --  1.60*  CALCIUM 8.4*  --  8.4*  MG  --  1.6* 1.8  PHOS  --  4.1 3.4     PHYSICAL EXAM per Dr. Leonie Man Obese middle-aged African-American lady currently not in distress. . Afebrile. Head is nontraumatic. Neck is supple without bruit.    Cardiac exam no murmur or gallop. Lungs are clear to auscultation. Distal pulses are well felt. Neurological Exam ;  Awake  Alert oriented x 3. Normal speech and language.diminished recall 2/3.  eye movements full without nystagmus.fundi were not visualized. Vision acuity and fields appear normal. Hearing is normal. Palatal movements are normal. Face symmetric. Tongue midline. Normal strength, tone, reflexes and coordination. Normal sensation. Gait deferred.   ASSESSMENT/PLAN [name] is a 68 y.o. female with history of of sleep apnea, sickle cell trait, sarcoidosis, lung cancer, hypertension, CHF and asthma presenting with altered mental status following fall during the night associated with dizziness, blurry vision.   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 Scalp hematoma  MRI R & L thalamic infarcts both at the midline.  Left forehead and frontal scalp hematoma  MRA normal.  No basilar tip abnormality  Carotid Doppler  B ICA 1-39% stenosis, VAs antegrade   2D Echo  EF > 65%. No source of embolus   LE doppler no DVT  EEG neg for seizures  TEE small PFO. No cardiac source of embolism otherwise  Lower extremity venous Doppler no DVT  implantable loop recorder placed 2/20  to evaluate for atrial fibrillation as etiology of stroke  LDL 95  HgbA1c 6.0  Lovenox 40 mg sq daily for VTE prophylaxis  aspirin 81 mg daily prior to admission, now on aspirin 325 mg daily. Given eyelid bruising and additional bruising, will not give DAPT. Continue aspirin 325 mg at d/c  Therapy recommendations:  HH PT, HH OT  Disposition:  Return home   Hypertension  Stable . Permissive hypertension (OK if < 220/120) but gradually normalize in 5-7 days . Long-term BP goal normotensive  Hyperlipidemia  Home meds: No statin  LDL 95, goal < 70  Add  statin - lipitor 40  Continue statin at discharge  Pre Diabetes   HgbA1c 6.0, goal < 7.0  Other Stroke Risk Factors  Advanced age  Morbid obesity, Body mass index is 62.06 kg/m., recommend weight loss, diet and exercise as appropriate   UDS / ETOH level negative    Obstructive sleep apnea, not on CPAP at home  Congestive heart failure  Sickle cell trait  Sarcoidosis   Other Active Problems  Hx lung cancer R lung 2012  Hypokalemia  Hypomagnesemia  Anemia, iron deficiency   Ok for d/c from stroke standpoint Follow-up Stroke Clinic at [facility] in 4 weeks. Office will call with appointment date and time. Order placed.  Hospital day # [place], MSN, APRN, ANVP-BC, AGPCNP-BC Advanced Practice Stroke Nurse [place] for  Schedule & Pager information 09/09/2018 11:27 AM  I have personally obtained history,examined this patient, reviewed notes, independently viewed imaging studies, participated in medical decision making and plan of care.ROS completed by me personally and pertinent positives fully documented  I have made any additions or clarifications directly to the above note. Agree with note above.   Antony Contras, MD Medical Director [facility] Pager: [phone] 09/09/2018 1:52 PM   To contact Stroke Continuity provider, please refer to [email or web address] After hours, contact General Neurology

## 2018-09-09 NOTE — Discharge Summary (Signed)
Physician Discharge Summary  Patient ID: Pamela [name] MRN: [id number] DOB/AGE: 11-16-1950 68 y.o.  Admit date: 09/06/2018 Discharge date: 09/09/2018  Admission Diagnoses:  Discharge Diagnoses:  Principal Problem:   Acute CVA (cerebrovascular accident) [facility]) Active Problems:   Prediabetes   Essential hypertension   Morbid (severe) obesity due to excess calories ([place])   Obstructive sleep apnea   Anemia, iron deficiency   Closed head injury with concussion   Hypokalemia   Hypomagnesemia   Discharged Condition: stable  Hospital Course:  Pamela [name] a 68 year old female, morbidly obese, with past medical history significant forosteoarthritis, asthma, lung cancer, COPD, gout, hypertension, history of right upper lobe lung adenocarcinoma, right chest wall mass, sarcoidosis, sickle cell trait and sleep apnea but not compliant with home CPAP machine.  Patient was found on the floor, altered with hematoma on her left forehead and around right knee area. with epistaxis from her left nostril.  MRI of the brain done revealed bilateral thalamic infarct.  Patient was on aspirin 81 mg p.o. once daily prior to presentation.  Patient was admitted for further assessment and management.  Neurology team was consulted to assist in patient's management.  Neurology team directed patient's management during the hospital stay.  TEE revealed small PFO with right-to-left shunt.    Doppler ultrasound of the lower extremities was nonrevealing.  Carotid Doppler ultrasound was nonrevealing.  Patient underwent implantation of the loop recorder by the electrophysiology team.  Patient's aspirin has been increased to 325 mg p.o. once daily.  She has been cleared for discharge by the neurology team.    Acute CVA (cerebrovascular accident) ([place]) Aspirin has been increased to 325 Mg p.o. once daily.   MRI brain result is as documented (bilateral thalamic infarct).  TEE revealed small PFO. Doppler ultrasound of lower  extremity was nonrevealing Carotid Doppler ultrasound was nonrevealing.  Echocardiogram revealed EF of greater than 65% and aneurysmal interatrial septum. Neurology team directed patient's care. Electrophysiology team is placed loop recorder to rule out possible paroxysmal atrial fibrillation.  Possible closed head injury with concussion: Patient's mentation has improved significantly. Continue to monitor.  Prediabetes HbA1c was 6%.  Essential hypertension Antihypertensives were held to permit hypertension.   Continue to monitor and optimize blood pressure.  Morbid (severe) obesity due to excess calories ([place]) Will need significant lifestyle modifications (diet and exercise).  Obstructive sleep apnea Noncompliant.   Quit using CPAP machine.    Anemia, iron deficiency Monitor hematocrit and hemoglobin.  Hypokalemia Continue to monitor and replete.  Hypomagnesemia Continue to monitor and replete.   Consults: cardiology and neurology  Significant Diagnostic Studies:   Discharge Exam: Blood pressure (!) 131/96, pulse 61, temperature 97.8 F (36.6 C), temperature source Oral, resp. rate 18, height 5\' 4"  (1.626 m), weight (!) 164 kg, SpO2 96 %.  Disposition: Discharge disposition: 06-Home-Health Care Svc  Discharge Instructions    Ambulatory referral to Neurology   Complete by:  As directed    Follow up with stroke clinic NP (Jessica Vanschaick or Cecille Rubin, if both not available, consider Dr. Antony Contras, Dr. Bess Harvest, or Dr. Sarina Ill) at [facility] in about 4 weeks.   Diet - low sodium heart healthy   Complete by:  As directed    Increase activity slowly   Complete by:  As directed      Allergies as of 09/09/2018      Reactions   Sulfur Rash      Medication List    STOP taking these  medications   acetaminophen 500 MG tablet Commonly known as:  TYLENOL   amoxicillin-clavulanate 875-125 MG tablet Commonly known as:   AUGMENTIN   furosemide 40 MG tablet Commonly known as:  LASIX   potassium chloride 10 MEQ tablet Commonly known as:  K-DUR     TAKE these medications   albuterol 108 (90 Base) MCG/ACT inhaler Commonly known as:  PROAIR HFA Inhale 2 puffs into the lungs every 6 (six) hours as needed for wheezing or shortness of breath.   albuterol (2.5 MG/3ML) 0.083% nebulizer solution Commonly known as:  PROVENTIL Take 3 mLs (2.5 mg total) by nebulization every 6 (six) hours as needed for wheezing or shortness of breath.   aspirin 325 MG tablet Take 1 tablet (325 mg total) by mouth daily. Start taking on:  September 10, 2018 What changed:    medication strength  how much to take   atorvastatin 40 MG tablet Commonly known as:  LIPITOR Take 1 tablet (40 mg total) by mouth daily at 6 PM.   budesonide-formoterol 160-4.5 MCG/ACT inhaler Commonly known as:  SYMBICORT Inhale 2 puffs into the lungs 2 (two) times daily.   calcitRIOL 0.25 MCG capsule Commonly known as:  ROCALTROL Take 0.25 mcg by mouth daily.   docusate sodium 100 MG capsule Commonly known as:  COLACE Take 1 capsule (100 mg total) by mouth 2 (two) times daily.   febuxostat 40 MG tablet Commonly known as:  ULORIC Take 1 tablet (40 mg total) by mouth daily.   montelukast 10 MG tablet Commonly known as:  SINGULAIR Take 1 tablet (10 mg total) by mouth at bedtime.            Durable Medical Equipment  (From admission, onward)         Start     Ordered   09/09/18 1103  For home use only DME 3 n 1  Once    Comments:  Bariatric size 5'4" and 360 lbs.   09/09/18 1103         Follow-up Information    [place] Office Follow up.   Specialty:  Cardiology Why:  09/22/2018 @ 10:00AM, wound check visit Contact information: [address] [place] [address] [phone]       [facility] Follow up in 4 week(s).   Specialty:  Neurology Why:  stroke  clinic. office will call wtih appt date and time Contact information: [address] [place] [phone]          Signed: Bonnell Public 09/09/2018, 1:43 PM

## 2018-09-09 NOTE — Progress Notes (Signed)
Physical Therapy Treatment Patient Details Name: Pamela [name] MRN: [id number] DOB: 1951-02-28 Today's Date: 09/09/2018    History of Present Illness Pt is a 68 y/o female admitted from home after being found down with AMS and a hematoma on her left forehead. MRI revealed bilateral thalamic infarcts. PMH including but not limited to COPD, asthma, lung cancer, HTN, CHF and sarcoidosis.    PT Comments    Pt making slow progress towards achieving her current functional mobility goals. She remains limited secondary to R knee pain and fatigue. Pt would continue to benefit from skilled physical therapy services at this time while admitted and after d/c to address the below listed limitations in order to improve overall safety and independence with functional mobility.    Follow Up Recommendations  Home health PT;Supervision/Assistance - 24 hour     Equipment Recommendations  None recommended by PT    Recommendations for Other Services       Precautions / Restrictions Precautions Precautions: Fall Restrictions Weight Bearing Restrictions: No    Mobility  Bed Mobility Overal bed mobility: Needs Assistance Bed Mobility: Supine to Sit     Supine to sit: Min guard     General bed mobility comments: greatly increased time and effort required, HOB elevated, use of bed rails, min guard for safety  Transfers Overall transfer level: Needs assistance Equipment used: Rolling walker (2 wheeled) Transfers: Sit to/from Stand Sit to Stand: Min guard         General transfer comment: increased time and effort, use of momentum, cueing for safe hand placement, min guard for safety  Ambulation/Gait Ambulation/Gait assistance: Min guard Gait Distance (Feet): 20 Feet Assistive device: Rolling walker (2 wheeled) Gait Pattern/deviations: Step-to pattern;Step-through pattern;Decreased step length - right;Decreased step length - left;Decreased stride length;Trunk flexed Gait velocity:  decreased   General Gait Details: remains limited secondary to R knee pain; pt with very slow, cautious gait with RW in room; pt with mild instability but no overt LOB or need for physical assistance, min guard for safety   Stairs             Wheelchair Mobility    Modified Rankin (Stroke Patients Only) Modified Rankin (Stroke Patients Only) Pre-Morbid Rankin Score: Moderate disability Modified Rankin: Moderate disability     Balance Overall balance assessment: Needs assistance Sitting-balance support: Feet supported Sitting balance-Leahy Scale: Fair     Standing balance support: During functional activity;Bilateral upper extremity supported Standing balance-Leahy Scale: Poor                              Cognition Arousal/Alertness: Awake/alert Behavior During Therapy: WFL for tasks assessed/performed Overall Cognitive Status: Within Functional Limits for tasks assessed                                        Exercises      General Comments        Pertinent Vitals/Pain Pain Assessment: Faces Faces Pain Scale: Hurts even more Pain Location: R knee Pain Descriptors / Indicators: Sore;Aching Pain Intervention(s): Monitored during session;Repositioned;Ice applied    Home Living                      Prior Function            PT Goals (current goals can now be found in  the care plan section) Acute Rehab PT Goals PT Goal Formulation: With patient Time For Goal Achievement: 09/21/18 Potential to Achieve Goals: Good Progress towards PT goals: Progressing toward goals    Frequency    Min 4X/week      PT Plan Current plan remains appropriate    Co-evaluation              AM-PAC PT "6 Clicks" Mobility   Outcome Measure  Help needed turning from your back to your side while in a flat bed without using bedrails?: None Help needed moving from lying on your back to sitting on the side of a flat bed without  using bedrails?: None Help needed moving to and from a bed to a chair (including a wheelchair)?: A Little Help needed standing up from a chair using your arms (e.g., wheelchair or bedside chair)?: A Little Help needed to walk in hospital room?: A Little Help needed climbing 3-5 steps with a railing? : A Lot 6 Click Score: 19    End of Session Equipment Utilized During Treatment: Gait belt Activity Tolerance: Patient limited by fatigue;Patient limited by pain Patient left: in chair;with call bell/phone within reach;with chair alarm set Nurse Communication: Mobility status PT Visit Diagnosis: Other abnormalities of gait and mobility (R26.89);Muscle weakness (generalized) (M62.81)     Time: 6384-6659 PT Time Calculation (min) (ACUTE ONLY): 18 min  Charges:  $Gait Training: 8-22 mins                     Sherie Don, [place], DPT  Acute Rehabilitation Services Pager [phone] Office [place] [facility] 09/09/2018, 10:17 AM

## 2018-09-09 NOTE — Plan of Care (Signed)
  Problem: Education: Goal: Knowledge of General Education information will improve Description Including pain rating scale, medication(s)/side effects and non-pharmacologic comfort measures Outcome: Progressing   Problem: Health Behavior/Discharge Planning: Goal: Ability to manage health-related needs will improve Outcome: Progressing   Problem: Clinical Measurements: Goal: Will remain free from infection Outcome: Progressing Goal: Respiratory complications will improve Outcome: Progressing Goal: Cardiovascular complication will be avoided Outcome: Progressing   Problem: Coping: Goal: Level of anxiety will decrease Outcome: Progressing   Problem: Education: Goal: Knowledge of disease or condition will improve Outcome: Progressing Goal: Knowledge of secondary prevention will improve Outcome: Progressing Goal: Knowledge of patient specific risk factors addressed and post discharge goals established will improve Outcome: Progressing   Problem: Coping: Goal: Will verbalize positive feelings about self Outcome: Progressing Goal: Will identify appropriate support needs Outcome: Progressing   Problem: Nutrition: Goal: Risk of aspiration will decrease Outcome: Progressing

## 2018-09-09 NOTE — Progress Notes (Signed)
Patient is discharged home on home health with family. Discharge instruction given to patient education given on new medications Lipitor and aspirin. All questions answered and concerns addressed. Patient verbalized understanding.

## 2018-09-09 NOTE — Care Management Note (Signed)
Case Management Note  Patient Details  Name: Pamela [name] MRN: [id number] Date of Birth: 1951/02/17  Subjective/Objective:                    Action/Plan: Pt discharging home with [facility] services. CM provided her choice and [facility] selected. Dorian Pod with [facility] notified and accepted the referral. Pt with orders for 3 in 1. Jermaine with [facility] DME delivered the DME to the room. Pt states she has 24 hour supervision at home and transportation to home.  Expected Discharge Date:  09/09/18               Expected Discharge Plan:  [place]  In-House Referral:     Discharge planning Services  CM Consult  Post Acute Care Choice:  Durable Medical Equipment, Home Health Choice offered to:  Patient  DME Arranged:  3-N-1(bariatric) DME Agency:  [place] Arranged:  PT, OT [facility] Agency:  [facility]  Status of Service:  Completed, signed off  If discussed at [place] of Stay Meetings, dates discussed:    Additional Comments:  Pollie Friar, RN 09/09/2018, 1:57 PM

## 2018-09-10 DIAGNOSIS — I5030 Unspecified diastolic (congestive) heart failure: Secondary | ICD-10-CM | POA: Diagnosis not present

## 2018-09-10 DIAGNOSIS — Z9181 History of falling: Secondary | ICD-10-CM | POA: Diagnosis not present

## 2018-09-10 DIAGNOSIS — J9612 Chronic respiratory failure with hypercapnia: Secondary | ICD-10-CM | POA: Diagnosis not present

## 2018-09-10 DIAGNOSIS — I130 Hypertensive heart and chronic kidney disease with heart failure and stage 1 through stage 4 chronic kidney disease, or unspecified chronic kidney disease: Secondary | ICD-10-CM | POA: Diagnosis not present

## 2018-09-10 DIAGNOSIS — G4733 Obstructive sleep apnea (adult) (pediatric): Secondary | ICD-10-CM | POA: Diagnosis not present

## 2018-09-10 DIAGNOSIS — C3491 Malignant neoplasm of unspecified part of right bronchus or lung: Secondary | ICD-10-CM | POA: Diagnosis not present

## 2018-09-10 DIAGNOSIS — M1990 Unspecified osteoarthritis, unspecified site: Secondary | ICD-10-CM | POA: Diagnosis not present

## 2018-09-10 DIAGNOSIS — N183 Chronic kidney disease, stage 3 (moderate): Secondary | ICD-10-CM | POA: Diagnosis not present

## 2018-09-10 DIAGNOSIS — J449 Chronic obstructive pulmonary disease, unspecified: Secondary | ICD-10-CM | POA: Diagnosis not present

## 2018-09-10 DIAGNOSIS — J9611 Chronic respiratory failure with hypoxia: Secondary | ICD-10-CM | POA: Diagnosis not present

## 2018-09-10 DIAGNOSIS — R7303 Prediabetes: Secondary | ICD-10-CM | POA: Diagnosis not present

## 2018-09-10 DIAGNOSIS — I69354 Hemiplegia and hemiparesis following cerebral infarction affecting left non-dominant side: Secondary | ICD-10-CM | POA: Diagnosis not present

## 2018-09-10 DIAGNOSIS — M109 Gout, unspecified: Secondary | ICD-10-CM | POA: Diagnosis not present

## 2018-09-10 DIAGNOSIS — D509 Iron deficiency anemia, unspecified: Secondary | ICD-10-CM | POA: Diagnosis not present

## 2018-09-11 LAB — CULTURE, BLOOD (ROUTINE X 2)
Culture: NO GROWTH
Culture: NO GROWTH

## 2018-09-12 ENCOUNTER — Emergency Department (HOSPITAL_COMMUNITY)
Admission: EM | Admit: 2018-09-12 | Discharge: 2018-09-13 | Disposition: A | Discharge status: Other Acute Inpatient Hospital | Payer: Medicare Other | Attending: Emergency Medicine | Admitting: Emergency Medicine

## 2018-09-12 ENCOUNTER — Ambulatory Visit: Payer: Medicare Other | Admitting: Podiatry

## 2018-09-12 ENCOUNTER — Other Ambulatory Visit: Payer: Self-pay

## 2018-09-12 DIAGNOSIS — J45909 Unspecified asthma, uncomplicated: Secondary | ICD-10-CM | POA: Insufficient documentation

## 2018-09-12 DIAGNOSIS — R600 Localized edema: Secondary | ICD-10-CM | POA: Insufficient documentation

## 2018-09-12 DIAGNOSIS — I110 Hypertensive heart disease with heart failure: Secondary | ICD-10-CM | POA: Diagnosis not present

## 2018-09-12 DIAGNOSIS — S0083XD Contusion of other part of head, subsequent encounter: Secondary | ICD-10-CM | POA: Insufficient documentation

## 2018-09-12 DIAGNOSIS — R0602 Shortness of breath: Secondary | ICD-10-CM | POA: Insufficient documentation

## 2018-09-12 DIAGNOSIS — J449 Chronic obstructive pulmonary disease, unspecified: Secondary | ICD-10-CM | POA: Insufficient documentation

## 2018-09-12 DIAGNOSIS — Z7982 Long term (current) use of aspirin: Secondary | ICD-10-CM | POA: Diagnosis not present

## 2018-09-12 DIAGNOSIS — R531 Weakness: Secondary | ICD-10-CM | POA: Diagnosis not present

## 2018-09-12 DIAGNOSIS — Z79899 Other long term (current) drug therapy: Secondary | ICD-10-CM | POA: Diagnosis not present

## 2018-09-12 DIAGNOSIS — Z8673 Personal history of transient ischemic attack (TIA), and cerebral infarction without residual deficits: Secondary | ICD-10-CM | POA: Insufficient documentation

## 2018-09-12 DIAGNOSIS — M25569 Pain in unspecified knee: Secondary | ICD-10-CM | POA: Insufficient documentation

## 2018-09-12 DIAGNOSIS — W19XXXD Unspecified fall, subsequent encounter: Secondary | ICD-10-CM | POA: Diagnosis not present

## 2018-09-12 DIAGNOSIS — R1084 Generalized abdominal pain: Secondary | ICD-10-CM

## 2018-09-12 DIAGNOSIS — I5032 Chronic diastolic (congestive) heart failure: Secondary | ICD-10-CM | POA: Insufficient documentation

## 2018-09-12 DIAGNOSIS — I444 Left anterior fascicular block: Secondary | ICD-10-CM | POA: Diagnosis not present

## 2018-09-12 DIAGNOSIS — M109 Gout, unspecified: Secondary | ICD-10-CM

## 2018-09-12 DIAGNOSIS — K5900 Constipation, unspecified: Secondary | ICD-10-CM

## 2018-09-12 DIAGNOSIS — C3490 Malignant neoplasm of unspecified part of unspecified bronchus or lung: Secondary | ICD-10-CM | POA: Diagnosis not present

## 2018-09-12 MED ORDER — SODIUM CHLORIDE 0.9% FLUSH
3.0000 mL | Freq: Once | INTRAVENOUS | Status: AC
  Administered 2018-09-13: 3 mL via INTRAVENOUS

## 2018-09-12 NOTE — ED Triage Notes (Signed)
Pt here for abdominal pain and generalized weakness for the past day.  Hx of stroke that left her with left sided arm weakness.  Pt states she had generalized weakness and her left arm is weaker than the rest of her body.  Pt also complaining of nausea. No new neuro deficits. VAN negative

## 2018-09-13 ENCOUNTER — Emergency Department (HOSPITAL_COMMUNITY): Payer: Medicare Other

## 2018-09-13 DIAGNOSIS — J441 Chronic obstructive pulmonary disease with (acute) exacerbation: Secondary | ICD-10-CM | POA: Diagnosis not present

## 2018-09-13 DIAGNOSIS — M25561 Pain in right knee: Secondary | ICD-10-CM | POA: Diagnosis not present

## 2018-09-13 DIAGNOSIS — R262 Difficulty in walking, not elsewhere classified: Secondary | ICD-10-CM | POA: Diagnosis not present

## 2018-09-13 DIAGNOSIS — C3490 Malignant neoplasm of unspecified part of unspecified bronchus or lung: Secondary | ICD-10-CM | POA: Diagnosis not present

## 2018-09-13 DIAGNOSIS — J45901 Unspecified asthma with (acute) exacerbation: Secondary | ICD-10-CM | POA: Diagnosis not present

## 2018-09-13 DIAGNOSIS — R41841 Cognitive communication deficit: Secondary | ICD-10-CM | POA: Diagnosis not present

## 2018-09-13 DIAGNOSIS — D638 Anemia in other chronic diseases classified elsewhere: Secondary | ICD-10-CM | POA: Diagnosis not present

## 2018-09-13 DIAGNOSIS — E876 Hypokalemia: Secondary | ICD-10-CM | POA: Diagnosis not present

## 2018-09-13 DIAGNOSIS — I130 Hypertensive heart and chronic kidney disease with heart failure and stage 1 through stage 4 chronic kidney disease, or unspecified chronic kidney disease: Secondary | ICD-10-CM | POA: Diagnosis not present

## 2018-09-13 DIAGNOSIS — T50905A Adverse effect of unspecified drugs, medicaments and biological substances, initial encounter: Secondary | ICD-10-CM | POA: Diagnosis not present

## 2018-09-13 DIAGNOSIS — D869 Sarcoidosis, unspecified: Secondary | ICD-10-CM | POA: Diagnosis not present

## 2018-09-13 DIAGNOSIS — N183 Chronic kidney disease, stage 3 (moderate): Secondary | ICD-10-CM | POA: Diagnosis not present

## 2018-09-13 DIAGNOSIS — I639 Cerebral infarction, unspecified: Secondary | ICD-10-CM | POA: Diagnosis not present

## 2018-09-13 DIAGNOSIS — M1990 Unspecified osteoarthritis, unspecified site: Secondary | ICD-10-CM | POA: Diagnosis not present

## 2018-09-13 DIAGNOSIS — R1084 Generalized abdominal pain: Secondary | ICD-10-CM | POA: Diagnosis not present

## 2018-09-13 DIAGNOSIS — I959 Hypotension, unspecified: Secondary | ICD-10-CM | POA: Diagnosis not present

## 2018-09-13 DIAGNOSIS — R531 Weakness: Secondary | ICD-10-CM | POA: Diagnosis not present

## 2018-09-13 DIAGNOSIS — K5900 Constipation, unspecified: Secondary | ICD-10-CM | POA: Diagnosis not present

## 2018-09-13 DIAGNOSIS — R600 Localized edema: Secondary | ICD-10-CM | POA: Diagnosis not present

## 2018-09-13 DIAGNOSIS — N179 Acute kidney failure, unspecified: Secondary | ICD-10-CM | POA: Diagnosis not present

## 2018-09-13 DIAGNOSIS — I110 Hypertensive heart disease with heart failure: Secondary | ICD-10-CM | POA: Diagnosis not present

## 2018-09-13 DIAGNOSIS — N289 Disorder of kidney and ureter, unspecified: Secondary | ICD-10-CM | POA: Diagnosis not present

## 2018-09-13 DIAGNOSIS — Z7982 Long term (current) use of aspirin: Secondary | ICD-10-CM | POA: Diagnosis not present

## 2018-09-13 DIAGNOSIS — E785 Hyperlipidemia, unspecified: Secondary | ICD-10-CM | POA: Diagnosis not present

## 2018-09-13 DIAGNOSIS — G4733 Obstructive sleep apnea (adult) (pediatric): Secondary | ICD-10-CM | POA: Diagnosis not present

## 2018-09-13 DIAGNOSIS — I5030 Unspecified diastolic (congestive) heart failure: Secondary | ICD-10-CM | POA: Diagnosis not present

## 2018-09-13 DIAGNOSIS — D509 Iron deficiency anemia, unspecified: Secondary | ICD-10-CM | POA: Diagnosis not present

## 2018-09-13 DIAGNOSIS — R635 Abnormal weight gain: Secondary | ICD-10-CM | POA: Diagnosis not present

## 2018-09-13 DIAGNOSIS — S060X9D Concussion with loss of consciousness of unspecified duration, subsequent encounter: Secondary | ICD-10-CM | POA: Diagnosis not present

## 2018-09-13 DIAGNOSIS — J4531 Mild persistent asthma with (acute) exacerbation: Secondary | ICD-10-CM | POA: Diagnosis not present

## 2018-09-13 DIAGNOSIS — I69354 Hemiplegia and hemiparesis following cerebral infarction affecting left non-dominant side: Secondary | ICD-10-CM | POA: Diagnosis not present

## 2018-09-13 DIAGNOSIS — R7303 Prediabetes: Secondary | ICD-10-CM | POA: Diagnosis not present

## 2018-09-13 DIAGNOSIS — M109 Gout, unspecified: Secondary | ICD-10-CM | POA: Diagnosis not present

## 2018-09-13 DIAGNOSIS — M2550 Pain in unspecified joint: Secondary | ICD-10-CM | POA: Diagnosis not present

## 2018-09-13 DIAGNOSIS — I5032 Chronic diastolic (congestive) heart failure: Secondary | ICD-10-CM | POA: Diagnosis not present

## 2018-09-13 DIAGNOSIS — R5381 Other malaise: Secondary | ICD-10-CM | POA: Diagnosis not present

## 2018-09-13 DIAGNOSIS — J449 Chronic obstructive pulmonary disease, unspecified: Secondary | ICD-10-CM | POA: Diagnosis not present

## 2018-09-13 DIAGNOSIS — M6281 Muscle weakness (generalized): Secondary | ICD-10-CM | POA: Diagnosis not present

## 2018-09-13 DIAGNOSIS — R0602 Shortness of breath: Secondary | ICD-10-CM | POA: Diagnosis not present

## 2018-09-13 DIAGNOSIS — Z7401 Bed confinement status: Secondary | ICD-10-CM | POA: Diagnosis not present

## 2018-09-13 LAB — COMPREHENSIVE METABOLIC PANEL
ALT: 16 U/L (ref 0–44)
AST: 20 U/L (ref 15–41)
Albumin: 3.2 g/dL — ABNORMAL LOW (ref 3.5–5.0)
Alkaline Phosphatase: 122 U/L (ref 38–126)
Anion gap: 12 (ref 5–15)
BUN: 25 mg/dL — ABNORMAL HIGH (ref 8–23)
CHLORIDE: 99 mmol/L (ref 98–111)
CO2: 27 mmol/L (ref 22–32)
Calcium: 8.6 mg/dL — ABNORMAL LOW (ref 8.9–10.3)
Creatinine, Ser: 1.64 mg/dL — ABNORMAL HIGH (ref 0.44–1.00)
GFR calc Af Amer: 38 mL/min — ABNORMAL LOW (ref >60)
GFR calc non Af Amer: 32 mL/min — ABNORMAL LOW (ref >60)
Glucose, Bld: 125 mg/dL — ABNORMAL HIGH (ref 70–99)
Potassium: 3.4 mmol/L — ABNORMAL LOW (ref 3.5–5.1)
Sodium: 138 mmol/L (ref 135–145)
Total Bilirubin: 0.6 mg/dL (ref 0.3–1.2)
Total Protein: 6.5 g/dL (ref 6.5–8.1)

## 2018-09-13 LAB — POCT I-STAT 7, (LYTES, BLD GAS, ICA,H+H)
Acid-Base Excess: 2 mmol/L (ref 0.0–2.0)
Bicarbonate: 28.9 mmol/L — ABNORMAL HIGH (ref 20.0–28.0)
CALCIUM ION: 1.15 mmol/L (ref 1.15–1.40)
HCT: 33 % — ABNORMAL LOW (ref 36.0–46.0)
Hemoglobin: 11.2 g/dL — ABNORMAL LOW (ref 12.0–15.0)
O2 SAT: 93 %
Patient temperature: 98.6
Potassium: 3.6 mmol/L (ref 3.5–5.1)
Sodium: 136 mmol/L (ref 135–145)
TCO2: 30 mmol/L (ref 22–32)
pCO2 arterial: 52.1 mmHg — ABNORMAL HIGH (ref 32.0–48.0)
pH, Arterial: 7.352 (ref 7.350–7.450)
pO2, Arterial: 70 mmHg — ABNORMAL LOW (ref 83.0–108.0)

## 2018-09-13 LAB — URINALYSIS, ROUTINE W REFLEX MICROSCOPIC
Bilirubin Urine: NEGATIVE
Glucose, UA: NEGATIVE mg/dL
Hgb urine dipstick: NEGATIVE
Ketones, ur: NEGATIVE mg/dL
Nitrite: NEGATIVE
Protein, ur: NEGATIVE mg/dL
Specific Gravity, Urine: 1.01 (ref 1.005–1.030)
pH: 5.5 (ref 5.0–8.0)

## 2018-09-13 LAB — CBC
HCT: 36 % (ref 36.0–46.0)
Hemoglobin: 11.1 g/dL — ABNORMAL LOW (ref 12.0–15.0)
MCH: 22.1 pg — ABNORMAL LOW (ref 26.0–34.0)
MCHC: 30.8 g/dL (ref 30.0–36.0)
MCV: 71.6 fL — AB (ref 80.0–100.0)
Platelets: 170 10*3/uL (ref 150–400)
RBC: 5.03 MIL/uL (ref 3.87–5.11)
RDW: 15.2 % (ref 11.5–15.5)
WBC: 5.4 10*3/uL (ref 4.0–10.5)
nRBC: 0 % (ref 0.0–0.2)

## 2018-09-13 LAB — URINALYSIS, MICROSCOPIC (REFLEX): RBC / HPF: NONE SEEN RBC/hpf (ref 0–5)

## 2018-09-13 LAB — BRAIN NATRIURETIC PEPTIDE: B Natriuretic Peptide: 21.6 pg/mL (ref 0.0–100.0)

## 2018-09-13 LAB — TROPONIN I: Troponin I: <0.03 ng/mL (ref <0.03)

## 2018-09-13 LAB — LIPASE, BLOOD: LIPASE: 35 U/L (ref 11–51)

## 2018-09-13 MED ORDER — FUROSEMIDE 40 MG PO TABS: 40.0000 mg | ORAL_TABLET | Freq: Every day | ORAL | 0 refills | Status: DC

## 2018-09-13 MED ORDER — HYDROCODONE-ACETAMINOPHEN 5-325 MG PO TABS
1.0000 | ORAL_TABLET | Freq: Once | ORAL | Status: AC
  Administered 2018-09-13: 1 via ORAL
  Filled 2018-09-13: qty 1

## 2018-09-13 MED ORDER — ASPIRIN 325 MG PO TABS: 325.0000 mg | ORAL_TABLET | Freq: Every day | ORAL | Status: DC

## 2018-09-13 MED ORDER — MONTELUKAST SODIUM 10 MG PO TABS: 10.0000 mg | ORAL_TABLET | Freq: Every day | ORAL | Status: DC

## 2018-09-13 MED ORDER — BUDESONIDE-FORMOTEROL FUMARATE 160-4.5 MCG/ACT IN AERO: 2.0000 | INHALATION_SPRAY | Freq: Two times a day (BID) | RESPIRATORY_TRACT | 0 refills | Status: DC

## 2018-09-13 MED ORDER — DOCUSATE SODIUM 100 MG PO CAPS: 100.0000 mg | ORAL_CAPSULE | Freq: Two times a day (BID) | ORAL | 0 refills | Status: DC

## 2018-09-13 MED ORDER — CALCITRIOL 0.25 MCG PO CAPS: 0.2500 ug | ORAL_CAPSULE | Freq: Every day | ORAL | 0 refills | Status: DC

## 2018-09-13 MED ORDER — ONDANSETRON HCL 4 MG/2ML IJ SOLN
4.0000 mg | Freq: Once | INTRAMUSCULAR | Status: AC
  Administered 2018-09-13: 4 mg via INTRAVENOUS
  Filled 2018-09-13: qty 2

## 2018-09-13 MED ORDER — FEBUXOSTAT 40 MG PO TABS: 40.0000 mg | ORAL_TABLET | Freq: Every day | ORAL | Status: DC

## 2018-09-13 MED ORDER — FEBUXOSTAT 40 MG PO TABS: 40.0000 mg | ORAL_TABLET | Freq: Every day | ORAL | 0 refills | Status: DC

## 2018-09-13 MED ORDER — ASPIRIN 325 MG PO TABS: 325.0000 mg | ORAL_TABLET | Freq: Every day | ORAL | 0 refills | Status: DC

## 2018-09-13 MED ORDER — ALBUTEROL SULFATE HFA 108 (90 BASE) MCG/ACT IN AERS: 2.0000 | INHALATION_SPRAY | Freq: Four times a day (QID) | RESPIRATORY_TRACT | Status: DC | PRN

## 2018-09-13 MED ORDER — ATORVASTATIN CALCIUM 40 MG PO TABS: 40.0000 mg | ORAL_TABLET | Freq: Every day | ORAL | 0 refills | Status: DC

## 2018-09-13 MED ORDER — ATORVASTATIN CALCIUM 40 MG PO TABS: 40.0000 mg | ORAL_TABLET | Freq: Every day | ORAL | Status: DC

## 2018-09-13 MED ORDER — POTASSIUM CHLORIDE CRYS ER 20 MEQ PO TBCR: 20.0000 meq | EXTENDED_RELEASE_TABLET | Freq: Every day | ORAL | 0 refills | Status: DC

## 2018-09-13 MED ORDER — MOMETASONE FURO-FORMOTEROL FUM 200-5 MCG/ACT IN AERO: 2.0000 | INHALATION_SPRAY | Freq: Two times a day (BID) | RESPIRATORY_TRACT | Status: DC

## 2018-09-13 MED ORDER — HYDROMORPHONE HCL 1 MG/ML IJ SOLN
1.0000 mg | Freq: Once | INTRAMUSCULAR | Status: AC
  Administered 2018-09-13: 1 mg via INTRAVENOUS
  Filled 2018-09-13: qty 1

## 2018-09-13 MED ORDER — ALBUTEROL SULFATE (2.5 MG/3ML) 0.083% IN NEBU
2.5000 mg | INHALATION_SOLUTION | Freq: Once | RESPIRATORY_TRACT | Status: AC
  Administered 2018-09-13: 2.5 mg via RESPIRATORY_TRACT
  Filled 2018-09-13: qty 3

## 2018-09-13 MED ORDER — MONTELUKAST SODIUM 10 MG PO TABS: 10.0000 mg | ORAL_TABLET | Freq: Every day | ORAL | 11 refills | Status: DC

## 2018-09-13 MED ORDER — ALBUTEROL SULFATE HFA 108 (90 BASE) MCG/ACT IN AERS: 2.0000 | INHALATION_SPRAY | Freq: Four times a day (QID) | RESPIRATORY_TRACT | 3 refills | Status: DC | PRN

## 2018-09-13 NOTE — Progress Notes (Signed)
CSW and RNCM actively working in placing pt. RNCM has faxed out pt at this time. CSW has spoken with [place] from [facility] as pt as expressed this being her first choice. Daleen Snook looking at pt at this time.      Virgie Dad Marshawn Ninneman, MSW, [place] Emergency Department Clinical Social Worker [phone]

## 2018-09-13 NOTE — ED Provider Notes (Signed)
[facility] EMERGENCY DEPARTMENT Provider Note   CSN: [id number] Arrival date & time: 09/12/18  2338    History   Chief Complaint Chief Complaint  Patient presents with  . Abdominal Pain  . Weakness    HPI Rama Sorci is a 68 y.o. female.     Patient presents to the emergency department with multiple complaints.  Patient was hospitalized recently for CVA.  When she had the CVA she had a fall and had bruising to her forehead, chest, knees.  She is still having pain in these areas.  Since yesterday she has been experiencing increased generalized weakness and also has been experiencing abdominal pain.  Pain is mostly on the right side of her abdomen.  She has been experiencing nausea.   Family reports that the patient had her Lasix stopped while she was in the hospital.  Since then she has had progressive swelling of her extremities.  She has been experiencing shortness of breath.     Past Medical History:  Diagnosis Date  . Arthritis   . Asthma   . Cancer ([place])    lung, adenocarcinoma right lung 2012  . CHF (congestive heart failure) (H[facility])    Preserved EF  . Congenital single kidney    With chronic kidney disease  . COPD (chronic obstructive pulmonary disease) ([place])   . Gout   . Hypertension   . Lung cancer [facility]) 2012   Right upper lobe lung adenocarcinoma diagnosed with needle biopsy treated by SBRT finished treatment April 2013 has been monitored since  . Mass of chest wall, right    Right chest wall mass 7.3 cm biopsy on 12/13/2013. Patient notes it was consistent with sarcoidosis but actual pathology results not available.  . Oxygen deficiency   . Sarcoidosis   . Sickle cell trait ([place])   . Sleep apnea     Patient Active Problem List   Diagnosis Date Noted  . Closed head injury with concussion 09/06/2018  . Acute CVA (cerebrovascular accident) ([place]) 09/06/2018  . Hypokalemia 09/06/2018  . Hypomagnesemia 09/06/2018  . Asthma, chronic  obstructive, with acute exacerbation ([place]) 08/02/2018  . Plantar fasciitis 04/14/2017  . Microcytosis 11/28/2015  . 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 Onychomycosis 01/09/2015  . Ingrown nail 01/09/2015  . Pain in lower limb 01/09/2015  . Chronic respiratory failure with hypoxia and hypercapnia ([place]) 07/19/2014  . CHF (congestive heart failure) ([place]) 06/11/2014  . Anemia, iron deficiency 05/27/2014  . Acute gouty arthritis 05/25/2014  . Acute renal failure superimposed on stage 3 chronic kidney disease ([place]) 05/25/2014  . Obstructive sleep apnea 05/25/2014  . Joint pain 05/24/2014  . Chronic diastolic CHF (congestive heart failure) ([place])   . Sarcoidosis   . Metabolic syndrome 85/27/7824  . Asthma, chronic 04/27/2014  . Anemia of chronic disease 04/19/2014  . Morbid (severe) obesity due to excess calories ([place]) 04/18/2014  . Immunization due 04/18/2014  . Need for prophylactic vaccination and inoculation against influenza 04/18/2014  . Prediabetes 04/13/2014  . Essential hypertension 04/13/2014  . Lower extremity edema 04/13/2014  . History of lung cancer 04/13/2014    Past Surgical History:  Procedure Laterality Date  . LOOP RECORDER INSERTION N/A 09/08/2018   Procedure: LOOP RECORDER INSERTION;  Surgeon: Evans Lance, MD;  Location: [facility];  Service: Cardiovascular;  Laterality: N/A;  . LUNG BIOPSY    . TEE WITHOUT CARDIOVERSION N/A 09/08/2018   Procedure: TRANSESOPHAGEAL ECHOCARDIOGRAM (TEE);  Surgeon: Josue Hector, MD;  Location: [facility];  Service: Cardiovascular;  Laterality: N/A;  with loop  . TUBAL LIGATION    . VEIN LIGATION AND STRIPPING       OB History   No obstetric history on file.      Home Medications    Prior to Admission medications   Medication Sig Start Date End Date Taking? Authorizing Provider  albuterol (PROAIR HFA) 108 (90 Base) MCG/ACT inhaler Inhale 2 puffs into the lungs every 6 (six) hours as needed for wheezing or shortness  of breath. 04/28/16  Yes Parrett, [name] S, NP  albuterol (PROVENTIL) (2.5 MG/3ML) 0.083% nebulizer solution Take 3 mLs (2.5 mg total) by nebulization every 6 (six) hours as needed for wheezing or shortness of breath. 12/10/17  Yes Dorena Dew, FNP  aspirin 325 MG tablet Take 1 tablet (325 mg total) by mouth daily. 09/10/18  Yes Dana Allan I, MD  atorvastatin (LIPITOR) 40 MG tablet Take 1 tablet (40 mg total) by mouth daily at 6 PM. 09/09/18  Yes Ogbata, Babs Bertin, MD  budesonide-formoterol (SYMBICORT) 160-4.5 MCG/ACT inhaler Inhale 2 puffs into the lungs 2 (two) times daily. 09/09/18  Yes Dana Allan I, MD  calcitRIOL (ROCALTROL) 0.25 MCG capsule Take 0.25 mcg by mouth daily.   Yes [provider]  docusate sodium (COLACE) 100 MG capsule Take 1 capsule (100 mg total) by mouth 2 (two) times daily. 09/09/18  Yes Bonnell Public, MD  febuxostat (ULORIC) 40 MG tablet Take 1 tablet (40 mg total) by mouth daily. 09/09/18  Yes Bonnell Public, MD  montelukast (SINGULAIR) 10 MG tablet Take 1 tablet (10 mg total) by mouth at bedtime. 09/01/18   Tanda Rockers, MD    Family History Family History  Problem Relation Age of Onset  . Hypertension Mother   . Renal Disease Mother   . Cancer Father        stomach  . Heart disease Father        No details  . Cancer Brother   . Diabetes Brother   . Cervical cancer Sister   . Diabetes Sister   . Multiple myeloma Sister     Social History Social History   Tobacco Use  . Smoking status: Never Smoker  . Smokeless tobacco: Never Used  Substance Use Topics  . Alcohol use: No    Alcohol/week: 0.0 standard drinks  . Drug use: No     Allergies   Sulfur   Review of Systems Review of Systems  Respiratory: Positive for shortness of breath.   Cardiovascular: Positive for leg swelling.  Gastrointestinal: Positive for abdominal pain and nausea.  Neurological: Positive for weakness.  All other systems reviewed and are  negative.    Physical Exam Updated Vital Signs BP 100/64   Pulse 66   Temp 97.8 F (36.6 C) (Oral)   Resp 14   SpO2 94%   Physical Exam Vitals signs and nursing note reviewed.  Constitutional:      General: She is not in acute distress.    Appearance: Normal appearance. She is well-developed.  HENT:     Head: Normocephalic and atraumatic.     Right Ear: Hearing normal.     Left Ear: Hearing normal.     Nose: Nose normal.  Eyes:     Conjunctiva/sclera: Conjunctivae normal.     Pupils: Pupils are equal, round, and reactive to light.  Neck:     Musculoskeletal: Normal range of motion and neck supple.  Cardiovascular:  Rate and Rhythm: Regular rhythm.     Heart sounds: S1 normal and S2 normal. No murmur. No friction rub. No gallop.   Pulmonary:     Effort: Pulmonary effort is normal. No respiratory distress.     Breath sounds: Normal breath sounds.  Chest:     Chest wall: No tenderness.  Abdominal:     General: Bowel sounds are normal.     Palpations: Abdomen is soft.     Tenderness: There is generalized abdominal tenderness. There is no guarding or rebound. Negative signs include Murphy's sign and McBurney's sign.     Hernia: No hernia is present.  Musculoskeletal: Normal range of motion.     Right lower leg: 2+ Pitting Edema present.     Left lower leg: 2+ Pitting Edema present.  Skin:    General: Skin is warm and dry.     Findings: Bruising (face) present. No rash.  Neurological:     Mental Status: She is alert and oriented to person, place, and time.     GCS: GCS eye subscore is 4. GCS verbal subscore is 5. GCS motor subscore is 6.     Cranial Nerves: No cranial nerve deficit.     Sensory: No sensory deficit.     Coordination: Coordination normal.  Psychiatric:        Speech: Speech normal.        Behavior: Behavior normal.        Thought Content: Thought content normal.      ED Treatments / Results  Labs (all labs ordered are listed, but only  abnormal results are displayed) Labs Reviewed  COMPREHENSIVE METABOLIC PANEL - Abnormal; Notable for the following components:      Result Value   Potassium 3.4 (*)    Glucose, Bld 125 (*)    BUN 25 (*)    Creatinine, Ser 1.64 (*)    Calcium 8.6 (*)    Albumin 3.2 (*)    GFR calc non Af Amer 32 (*)    GFR calc Af Amer 38 (*)    All other components within normal limits  CBC - Abnormal; Notable for the following components:   Hemoglobin 11.1 (*)    MCV 71.6 (*)    MCH 22.1 (*)    All other components within normal limits  LIPASE, BLOOD  BRAIN NATRIURETIC PEPTIDE  TROPONIN I  URINALYSIS, ROUTINE W REFLEX MICROSCOPIC    EKG EKG Interpretation  Date/Time:  Monday September 12 2018 23:51:58 EST Ventricular Rate:  76 PR Interval:  158 QRS Duration: 90 QT Interval:  382 QTC Calculation: 429 R Axis:   -48 Text Interpretation:  Normal sinus rhythm Pulmonary disease pattern Left anterior fascicular block Minimal voltage criteria for LVH, may be normal variant Abnormal ECG No significant change since last tracing Confirmed by Orpah Greek [phone]) on 09/13/2018 3:37:09 AM Also confirmed by Orpah Greek [phone]), editor Philomena Doheny [phone])  on 09/13/2018 7:25:55 AM   Radiology Ct Abdomen Pelvis Wo Contrast  Result Date: 09/13/2018 CLINICAL DATA:  Abdominal pain.  Lung carcinoma. EXAM: CT ABDOMEN AND PELVIS WITHOUT CONTRAST TECHNIQUE: Multidetector CT imaging of the abdomen and pelvis was performed following the standard protocol without IV contrast. Oral contrast was administered. COMPARISON:  PET-CT May 20, 2015 FINDINGS: Lower chest: There is bibasilar atelectatic change. No consolidation in the lung bases. There is a small hiatal hernia. Hepatobiliary: There is a 7 mm probable cyst in the posterior dome of the liver region. No  other focal liver lesions are apparent on this noncontrast enhanced study. Gallbladder wall is not appreciably thickened. There is no  biliary duct dilatation. Pancreas: No pancreatic mass or inflammatory focus. Spleen: No splenic lesions are evident. Adrenals/Urinary Tract: Adrenals bilaterally appear unremarkable. Kidneys bilaterally show no appreciable mass or hydronephrosis on either side. There is no demonstrable renal or ureteral calculus on either side. Urinary bladder is midline with wall thickness within normal limits. Stomach/Bowel: There are scattered sigmoid diverticula without diverticulitis. There is no appreciable bowel wall or mesenteric thickening. There is no evident bowel obstruction. There is no free air or portal venous air. There is lipomatous infiltration of the ileocecal valve. Vascular/Lymphatic: There is aortic tortuosity without aneurysm. No arterial vascular calcification is evident. There is no evident adenopathy in the abdomen or pelvis. Reproductive: The uterus is anteverted.  No pelvic masses evident. Other: The appendix appears unremarkable. There is no abscess or ascites in the abdomen or pelvis. Musculoskeletal: There is degenerative change in the lumbar spine. There are no blastic or lytic bone lesions. There is no intramuscular or chest wall lesion evident. IMPRESSION: 1. A cause for patient's symptoms has not been established with this study. 2. Appendix appears normal. No evident bowel wall thickening or bowel obstruction. There are sigmoid diverticula without diverticulitis. 3. No evident renal or ureteral calculus. No hydronephrosis. Urinary bladder wall thickness is within normal limits. 4.  Small hiatal hernia. Electronically Signed   By: Lowella Grip III M.D.   On: 09/13/2018 07:10   Dg Chest 2 View  Result Date: 09/13/2018 CLINICAL DATA:  68 y/o F; shortness of breath tonight with left flank pain and weakness. EXAM: CHEST - 2 VIEW COMPARISON:  09/06/2018 chest radiograph FINDINGS: Stable cardiomegaly given projection and technique. Loop recorder device. Right mid lung zone platelike atelectasis.  Low lung volumes. No consolidation, effusion, or pneumothorax. No acute osseous abnormality is evident. IMPRESSION: Right mid lung zone platelike atelectasis. Low lung volumes. Stable cardiomegaly. Electronically Signed   By: Kristine Garbe M.D.   On: 09/13/2018 04:32    Procedures Procedures (including critical care time)  Medications Ordered in ED Medications  sodium chloride flush (NS) 0.9 % injection 3 mL (3 mLs Intravenous Given 09/13/18 0537)  HYDROmorphone (DILAUDID) injection 1 mg (1 mg Intravenous Given 09/13/18 0536)  ondansetron (ZOFRAN) injection 4 mg (4 mg Intravenous Given 09/13/18 0536)     Initial Impression / Assessment and Plan / ED Course  I have reviewed the triage vital signs and the nursing notes.  Pertinent labs & imaging results that were available during my care of the patient were reviewed by me and considered in my medical decision making (see chart for details).        Patient presents to the emergency department for evaluation of multiple problems.  Patient recently had a stroke.  When she had that stroke she did have a fall.  She is still having pain in multiple areas where she is bruised from the fall.  No evidence of broken bones.  She does not have a headache or any new neurologic problems, does not require repeat neuroimaging.  Patient also complaining of a new pain in her right side of her abdomen.  Patient has a massive abdomen, examination is difficult but there is clearly no guarding, rebound or obvious surgical process.  Lab work is unremarkable.  Patient underwent CT abdomen and pelvis without any acute abnormality noted.  Family reports that she has had increased swelling since discharge from  the hospital.  She had Lasix stopped at that time, presumably secondary to renal insufficiency.  She does not have any hypoxia here.  Chest x-ray does not show evidence of fluid overload.  Her BNP is normal.  Might benefit from reinitiating her Lasix  secondary to her leg swelling, does not have any evidence of significant heart failure.  Patient's daughter reports that she was discharged from the hospital with physical therapy but they have only visited once.  Patient sleeps all day and is unable to ambulate around her house because of generalized weakness.  Prior to her stroke she was living independently.  It sounds like she is not doing well at home and might benefit from rehab admission.  Will have social work consult on the patient to evaluate for her living arrangements.  Final Clinical Impressions(s) / ED Diagnoses   Final diagnoses:  Weakness  Generalized abdominal pain    ED Discharge Orders    None       Orpah Greek, MD 09/13/18 [phone]

## 2018-09-13 NOTE — ED Notes (Signed)
Gave pt water, crackers and [place] sandwich

## 2018-09-13 NOTE — Progress Notes (Signed)
Physical Therapy Evaluation Patient Details Name: Pamela [name] MRN: [id number] DOB: [birth year] Today's Date: 09/13/2018   History of Present Illness  Patient is 68 y/o female presenting to hopsital due to inability to care fo self at home. Patient recently d/c home from hospital on 2/21 secondary to CVA. PMH includes CVA, COPD, asthma, lung caner, HTN, CHF, and sarcoidosis.   Clinical Impression  Patient presenting to hospital secondary to problems above and with deficits below. Patient required modA +2 to stand and take steps from EOB with 2HHA. Patient and daughter law report patient is home alone most of the day and unable to care for self without assistance. Given functional mobility deficits and decreased assistance at home, recommending SNF level therapy. Patient will benefit from acute physical therapy to maximize independence and safety with functional mobility.     Follow Up Recommendations SNF    Equipment Recommendations  None recommended by PT    Recommendations for Other Services       Precautions / Restrictions Precautions Precautions: Fall Restrictions Weight Bearing Restrictions: No      Mobility  Bed Mobility               General bed mobility comments: Up with nurse tech on arrival  Transfers Overall transfer level: Needs assistance Equipment used: 2 person hand held assist Transfers: Sit to/from Stand;Stand Pivot Transfers Sit to Stand: Mod assist;+2 physical assistance Stand pivot transfers: Min assist;+2 physical assistance       General transfer comment: Patient required minA +2 for stand pivot transfer from wheelchair upon arrival. Patient reaching for bed throughout transfer. Patient required modA +2 for lift assist to stand x2 with 2 person HHA. Patient with significant posterior lean during first standing trial requring return to sitting. Improved stability noted during second trial standing.   Ambulation/Gait Ambulation/Gait assistance:  Mod assist;+2 physical assistance   Assistive device: 2 person hand held assist       General Gait Details: Took 2 steps away from EOB. Patient required modA +2 for steadying to take steps.   Stairs            Wheelchair Mobility    Modified Rankin (Stroke Patients Only)       Balance Overall balance assessment: Needs assistance Sitting-balance support: Feet supported;No upper extremity supported Sitting balance-Leahy Scale: Good     Standing balance support: Bilateral upper extremity supported Standing balance-Leahy Scale: Poor Standing balance comment: reliant on external assistance to maintain standing balance                             Pertinent Vitals/Pain Pain Assessment: 0-10 Pain Score: 8  Pain Location: R knee Pain Descriptors / Indicators: Sore;Aching Pain Intervention(s): Limited activity within patient's tolerance;Monitored during session    Home Living Family/patient expects to be discharged to:: Skilled nursing facility                      Prior Function Level of Independence: Needs assistance   Gait / Transfers Assistance Needed: Reports using RW and needing assistance with mobility following d/c from hospital. Daughter in law reports patient required assistance to get in/out of car following d/c  ADL's / Homemaking Assistance Needed: Required assistance for ADLs        Hand Dominance        Extremity/Trunk Assessment   Upper Extremity Assessment Upper Extremity Assessment: LUE deficits/detail LUE Deficits / Details: LUE weakness  secondary to previous CVA    Lower Extremity Assessment Lower Extremity Assessment: Generalized weakness    Cervical / Trunk Assessment Cervical / Trunk Assessment: Normal  Communication   Communication: No difficulties  Cognition Arousal/Alertness: Awake/alert Behavior During Therapy: WFL for tasks assessed/performed Overall Cognitive Status: Within Functional Limits for tasks  assessed                                        General Comments General comments (skin integrity, edema, etc.): Patient daughter in law present during session    Exercises     Assessment/Plan    PT Assessment Patient needs continued PT services  PT Problem List Decreased strength;Decreased range of motion;Decreased activity tolerance;Decreased balance;Decreased mobility;Decreased knowledge of use of DME;Pain       PT Treatment Interventions DME instruction;Gait training;Functional mobility training;Therapeutic activities;Therapeutic exercise;Balance training;Patient/family education    PT Goals (Current goals can be found in the Care Plan section)  Acute Rehab PT Goals Patient Stated Goal: get stronger PT Goal Formulation: With patient Time For Goal Achievement: 09/27/18 Potential to Achieve Goals: Good    Frequency Min 2X/week   Barriers to discharge Decreased caregiver support      Co-evaluation               AM-PAC PT "6 Clicks" Mobility  Outcome Measure Help needed turning from your back to your side while in a flat bed without using bedrails?: A Little Help needed moving from lying on your back to sitting on the side of a flat bed without using bedrails?: A Little Help needed moving to and from a bed to a chair (including a wheelchair)?: A Lot Help needed standing up from a chair using your arms (e.g., wheelchair or bedside chair)?: A Lot Help needed to walk in hospital room?: A Lot Help needed climbing 3-5 steps with a railing? : Total 6 Click Score: 13    End of Session Equipment Utilized During Treatment: Gait belt Activity Tolerance: Patient tolerated treatment well Patient left: in bed;with call bell/phone within reach;with family/visitor present(sitting EOB) Nurse Communication: Mobility status PT Visit Diagnosis: Unsteadiness on feet (R26.81);Muscle weakness (generalized) (M62.81);Difficulty in walking, not elsewhere classified  (R26.2)    Time: 6803-2122 PT Time Calculation (min) (ACUTE ONLY): 10 min   Charges:   PT Evaluation $PT Eval Moderate Complexity: 1 Mod          Erick Blinks, SPT  Erick Blinks 09/13/2018, 1:37 PM

## 2018-09-13 NOTE — NC FL2 (Signed)
[name] LEVEL OF CARE SCREENING TOOL     IDENTIFICATION  Patient Name: Pamela [name] Birthdate: [birth year] Sex: female Admission Date (Current Location): 09/12/2018  [facility] and [place] Number:  Herbalist and Address:  [facility], [place] [address] [place], [place] [address]      Provider Number: [id number]  Attending Physician Name and Address:  Virgel Manifold, MD  Relative Name and Phone Number:       Current Level of Care: Home Recommended Level of Care: [place] Prior Approval Number:    Date Approved/Denied:   PASRR Number:   [id number]  Discharge Plan: SNF    Current Diagnoses: Patient Active Problem List   Diagnosis Date Noted  . Closed head injury with concussion 09/06/2018  . Acute CVA (cerebrovascular accident) ([place]) 09/06/2018  . Hypokalemia 09/06/2018  . Hypomagnesemia 09/06/2018  . Asthma, chronic obstructive, with acute exacerbation ([place]) 08/02/2018  . Plantar fasciitis 04/14/2017  . Microcytosis 11/28/2015  . 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 Onychomycosis 01/09/2015  . Ingrown nail 01/09/2015  . Pain in lower limb 01/09/2015  . Chronic respiratory failure with hypoxia and hypercapnia ([place]) 07/19/2014  . CHF (congestive heart failure) ([place]) 06/11/2014  . Anemia, iron deficiency 05/27/2014  . Acute gouty arthritis 05/25/2014  . Acute renal failure superimposed on stage 3 chronic kidney disease ([place]) 05/25/2014  . Obstructive sleep apnea 05/25/2014  . Joint pain 05/24/2014  . Chronic diastolic CHF (congestive heart failure) ([place])   . Sarcoidosis   . Metabolic syndrome 92/33/0076  . Asthma, chronic 04/27/2014  . Anemia of chronic disease 04/19/2014  . Morbid (severe) obesity due to excess calories ([place]) 04/18/2014  . Immunization due 04/18/2014  . Need for prophylactic vaccination and inoculation against influenza 04/18/2014  . Prediabetes 04/13/2014  . Essential hypertension  04/13/2014  . Lower extremity edema 04/13/2014  . History of lung cancer 04/13/2014    Orientation RESPIRATION BLADDER Height & Weight     Self, Time, Situation, Place  Normal Continent Weight:   Height:     BEHAVIORAL SYMPTOMS/MOOD NEUROLOGICAL BOWEL NUTRITION STATUS      Continent Diet(please see AVs)  AMBULATORY STATUS COMMUNICATION OF NEEDS Skin   Extensive Assist Verbally Normal                       Personal Care Assistance Level of Assistance  Dressing, Bathing, Feeding Bathing Assistance: Maximum assistance Feeding assistance: Limited assistance Dressing Assistance: Maximum assistance Total Care Assistance: Limited assistance   Functional Limitations Info  Sight, Hearing, Speech Sight Info: Impaired Hearing Info: Adequate Speech Info: Adequate    SPECIAL CARE FACTORS FREQUENCY  PT (By licensed PT), OT (By licensed OT)     PT Frequency: 5 x weekly OT Frequency: 5 x weekly            Contractures Contractures Info: Not present    Additional Factors Info  Code Status, Allergies Code Status Info: full Allergies Info: sulfur           Current Medications (09/13/2018):  This is the current hospital active medication list No current facility-administered medications for this encounter.    Current Outpatient Medications  Medication Sig Dispense Refill  . albuterol (PROAIR HFA) 108 (90 Base) MCG/ACT inhaler Inhale 2 puffs into the lungs every 6 (six) hours as needed for wheezing or shortness of breath. 1 Inhaler 3  . albuterol (PROVENTIL) (2.5 MG/3ML) 0.083% nebulizer solution Take 3 mLs (2.5  mg total) by nebulization every 6 (six) hours as needed for wheezing or shortness of breath. 75 mL 12  . aspirin 325 MG tablet Take 1 tablet (325 mg total) by mouth daily. 30 tablet 0  . atorvastatin (LIPITOR) 40 MG tablet Take 1 tablet (40 mg total) by mouth daily at 6 PM. 30 tablet 0  . budesonide-formoterol (SYMBICORT) 160-4.5 MCG/ACT inhaler Inhale 2 puffs into  the lungs 2 (two) times daily. 10.2 g 0  . calcitRIOL (ROCALTROL) 0.25 MCG capsule Take 0.25 mcg by mouth daily.    [name] Kitchen docusate sodium (COLACE) 100 MG capsule Take 1 capsule (100 mg total) by mouth 2 (two) times daily. 60 capsule 0  . febuxostat (ULORIC) 40 MG tablet Take 1 tablet (40 mg total) by mouth daily. 30 tablet 0  . montelukast (SINGULAIR) 10 MG tablet Take 1 tablet (10 mg total) by mouth at bedtime. 30 tablet 11     Discharge Medications: Please see discharge summary for a list of discharge medications.  Relevant Imaging Results:  Relevant Lab Results:   Additional Information SSN: [id number]  Pamela Mandril, RN

## 2018-09-13 NOTE — ED Notes (Signed)
This RN called [facility] and gave report to [facility] who will be receiving the pt. [facility] has been called and family is aware.

## 2018-09-13 NOTE — ED Notes (Signed)
Pt complains of abd pains and weakness.

## 2018-09-13 NOTE — Care Management (Signed)
ED CM contacted Intake SW at  [facility], patient was offered bed, and patient and  family updated and accepted. AVS and prescriptions was faxed to [phone]. ED CM updated [facility] RN [facility] was called for transport.

## 2018-09-13 NOTE — ED Notes (Signed)
Daughter at bedside. Reports mother was recently discharged on Friday post CVA, and has since declined. Has been increasingly weak, unable to ambulate, and no longer independent. Family is concerned of mother's health.

## 2018-09-13 NOTE — ED Notes (Signed)
[facility] called @ [phone]-per [facility],RN-called by Levada Dy

## 2018-09-13 NOTE — ED Notes (Addendum)
[facility] at bedside to transport pt to [facility]. D/c instructions and prescriptions sent with PT[facility]. Pt agreeable to transport and discharge, unable to sign discharge pad due to weakness.

## 2018-09-13 NOTE — Discharge Planning (Signed)
EDCM consulted to assist with returning home with [facility] vs SNF placement.  PT consulted for mobility.  Will await results of PT consult before determining disposition.

## 2018-09-13 NOTE — ED Notes (Signed)
Chris(SR)-[facility] Ordered-per [facility], RN-called by Levada Dy

## 2018-09-14 ENCOUNTER — Non-Acute Institutional Stay (SKILLED_NURSING_FACILITY): Payer: Medicare Other | Admitting: Internal Medicine

## 2018-09-14 ENCOUNTER — Encounter: Payer: Self-pay | Admitting: Internal Medicine

## 2018-09-14 DIAGNOSIS — R7303 Prediabetes: Secondary | ICD-10-CM

## 2018-09-14 DIAGNOSIS — E876 Hypokalemia: Secondary | ICD-10-CM | POA: Diagnosis not present

## 2018-09-14 DIAGNOSIS — I639 Cerebral infarction, unspecified: Secondary | ICD-10-CM

## 2018-09-14 DIAGNOSIS — D638 Anemia in other chronic diseases classified elsewhere: Secondary | ICD-10-CM

## 2018-09-14 DIAGNOSIS — N179 Acute kidney failure, unspecified: Secondary | ICD-10-CM | POA: Diagnosis not present

## 2018-09-14 DIAGNOSIS — N183 Chronic kidney disease, stage 3 (moderate): Secondary | ICD-10-CM | POA: Diagnosis not present

## 2018-09-14 DIAGNOSIS — I5032 Chronic diastolic (congestive) heart failure: Secondary | ICD-10-CM | POA: Diagnosis not present

## 2018-09-14 DIAGNOSIS — R600 Localized edema: Secondary | ICD-10-CM

## 2018-09-14 DIAGNOSIS — J4531 Mild persistent asthma with (acute) exacerbation: Secondary | ICD-10-CM

## 2018-09-14 NOTE — Progress Notes (Signed)
Location:    [facility] Room Number: 210/A Place of Service:  SNF [phone]) Provider:  Tanda Rockers, FNP  Patient Care Team: Lanae Boast, FNP as PCP - General (Family Medicine)  Extended Emergency Contact Information Primary Emergency Contact: Bearce,Orlandis Address: [address] [place], [place] [address] Johnnette Litter of [place] Phone: [phone] Work Phone: [phone] Mobile Phone: [phone] Relation: Son Secondary Emergency Contact: Hudson,Candace  [place] Phone: [phone] Relation: Daughter  Code Status:  Full Code Goals of care: Advanced Directive information Advanced Directives 09/14/2018  Does Patient Have a Medical Advance Directive? Yes  Type of Advance Directive (No Data)  Does patient want to make changes to medical advance directive? No - Patient declined  Would patient like information on creating a medical advance directive? No - Patient declined   Chief complaint-acute visit follow-up ER visit yesterday with subsequent admission to skilled nursing.      HPI:  Pt is a 68 y.o. female seen today for follow-up of ER visit yesterday which resulted in placement in skilled nursing.  Patient is a pleasant 68 year old female who came to the ER yesterday with multiple complaints she had been recently hospitalized for CVA bilateral thalamic infarcts.  She had a fall during the CVA and had bruising to her forehead chest and knees. She was complaining of that yesterday she also apparently complained of increased weakness and abdominal pain.  She also had swelling apparently her Lasix had been stopped during a previous hospitalization.  Per ER evaluation there was no evidence of broken bones or any new neurologic issues.  CT of the abdomen was performed which did not show any acute abnormalities.  She was started on Lasix 40 mg a day with increased  edema BNP was reassuring at only 21.6.  She is also being supplemented with potassium potassium was borderline low at 3.4 yesterday.  It was decided it would be best for her to go to skilled nursing for some continued rehab apparently during previous hospitalization she had been discharged home but apparently was not doing well at home.  Regards her previous hospitalization she was admitted after being found on the floor at home with a hematoma to her left forehead and right knee area and nosebleed.  MRI of her brain showed a bilateral thalamic infarct.  Her aspirin was increased to 325 mg a day per neurology.  Carotid Dopplers and Doppler ultrasound were negative for any acute findings.  She did have a loop recorder inserted.  To rule out proximal atrial fibrillation  This is followed by cardiology.  During the hospitalization she also had magnesium that was low this did normalize before discharge at 1.8.  Anemia and does have a history of sickle cell trait hemoglobin was 11.2 on lab done yesterday in the ER.  She also has a history of morbid obesity which is been baseline she says as long as she can remember  Her other diagnoses include a history of lung cancer status post radiation in 2013- as well as gout she continues on Uloric.  For COPD she continues on albuterol nebulizers as needed.  Regards to CHF she did have a cardiac echo done February 20 which showed normal left ventricular systolic function.   Currently she is sitting on the side of her bed comfortably vital signs are stable he continues to complain at times  of some right knee discomfort says her abdominal pain is improved- appears to be in good spirits     .        Past Medical History:  Diagnosis Date  . Arthritis   . Asthma   . Cancer ([facility])    lung, adenocarcinoma right lung 2012  . CHF (congestive heart failure) (HCC)    Preserved EF  . Congenital single kidney    With chronic kidney disease  .  COPD (chronic obstructive pulmonary disease) ([facility])   . Gout   . Hypertension   . Lung cancer [facility]) 2012   Right upper lobe lung adenocarcinoma diagnosed with needle biopsy treated by SBRT finished treatment April 2013 has been monitored since  . Mass of chest wall, right    Right chest wall mass 7.3 cm biopsy on 12/13/2013. Patient notes it was consistent with sarcoidosis but actual pathology results not available.  . Oxygen deficiency   . Sarcoidosis   . Sickle cell trait ([facility])   . Sleep apnea    Past Surgical History:  Procedure Laterality Date  . LOOP RECORDER INSERTION N/A 09/08/2018   Procedure: LOOP RECORDER INSERTION;  Surgeon: Evans Lance, MD;  Location: [facility];  Service: Cardiovascular;  Laterality: N/A;  . LUNG BIOPSY    . TEE WITHOUT CARDIOVERSION N/A 09/08/2018   Procedure: TRANSESOPHAGEAL ECHOCARDIOGRAM (TEE);  Surgeon: Josue Hector, MD;  Location: [facility] ENDOSCOPY;  Service: Cardiovascular;  Laterality: N/A;  with loop  . TUBAL LIGATION    . VEIN LIGATION AND STRIPPING      Allergies  Allergen Reactions  . Sulfur Rash    Allergies as of 09/14/2018      Reactions   Sulfur Rash      Medication List       Accurate as of September 14, 2018 10:53 AM. Always use your most recent med list.        albuterol (2.5 MG/3ML) 0.083% nebulizer solution Commonly known as:  PROVENTIL Take 3 mLs (2.5 mg total) by nebulization every 6 (six) hours as needed for wheezing or shortness of breath.   albuterol 108 (90 Base) MCG/ACT inhaler Commonly known as:  PROAIR HFA Inhale 2 puffs into the lungs every 6 (six) hours as needed for wheezing or shortness of breath.   aspirin 325 MG tablet Take 1 tablet (325 mg total) by mouth daily.   atorvastatin 40 MG tablet Commonly known as:  LIPITOR Take 1 tablet (40 mg total) by mouth daily at 6 PM.   budesonide-formoterol 160-4.5 MCG/ACT inhaler Commonly known as:  SYMBICORT Inhale 2 puffs into the lungs 2 (two) times  daily.   calcitRIOL 0.25 MCG capsule Commonly known as:  ROCALTROL Take 1 capsule (0.25 mcg total) by mouth daily.   docusate sodium 100 MG capsule Commonly known as:  COLACE Take 1 capsule (100 mg total) by mouth 2 (two) times daily.   febuxostat 40 MG tablet Commonly known as:  ULORIC Take 1 tablet (40 mg total) by mouth daily.   furosemide 40 MG tablet Commonly known as:  LASIX Take 1 tablet (40 mg total) by mouth daily.   montelukast 10 MG tablet Commonly known as:  SINGULAIR Take 1 tablet (10 mg total) by mouth at bedtime.   potassium chloride SA 20 MEQ tablet Commonly known as:  K-DUR,KLOR-CON Take 1 tablet (20 mEq total) by mouth daily.       Review of Systems   In general is not complaining of  any fever chills.  Skin does not complain of rashes or itching does have a hematoma to her left forehead which is still somewhat tender- also some bruising to her right knee.  Head ears eyes nose mouth and throat is not complain of visual changes or sore throat still has some tenderness to hematoma left forehead.  Respiratory does not complain of shortness of breath or cough does have a history of COPD asthma with some chronic mild wheezing.  Cardiac is not complaining of chest pain has venous stasis baseline edema.  GI is not complaining any nausea vomiting diarrhea or constipation at this time.  GU does not complain of dysuria.  Musculoskeletal is complaining of continued right knee discomfort at times he does have generalized weakness but apparently is feeling better than yesterday.  Neurologic is positive for weakness does not complain of syncope dizziness or headache at this time.  And psych does not complain of being depressed or anxious appears to be in good spirits    Immunization History  Administered Date(s) Administered  . Influenza,inj,Quad PF,6+ Mos 04/13/2014, 04/16/2015, 04/03/2016, 05/19/2017, 06/15/2018  . Pneumococcal Conjugate-13 04/03/2016  .  Pneumococcal Polysaccharide-23 04/13/2014  . Tdap 01/02/2015  . Zoster 05/21/2014   Pertinent  Health Maintenance Due  Topic Date Due  . PAP SMEAR-Modifier  10/13/2018 (Originally 01/01/2018)  . DEXA SCAN  10/13/2018 (Originally 11/06/2017)  . PNA vac Low Risk Adult (2 of 2 - PPSV23) 04/14/2019  . MAMMOGRAM  05/05/2020  . COLONOSCOPY  12/17/2026   Fall Risk  09/05/2018 09/01/2018 06/15/2018 03/14/2018 01/26/2018  Falls in the past year? 0 0 0 No No  Risk for fall due to : - - - - -   Functional Status Survey:    Vitals:   09/14/18 1043  BP: (!) 137/97  Pulse: 70  Resp: 16  Temp: 97.9 F (36.6 C)  Updated blood pressure is 121/69 pulse 69 respirations 20 blood pressure 96.0 oxygen saturation is 90% on room air  Physical Exam    In general this is a pleasant female in no distress sitting comfortably on the side of her bed.  Her skin is warm and dry she does have a residual hematoma left forehead there is some tenderness to palpation I do not see increased erythema. --She also continues have bruising of her right knee.  Eyes visual acuity appears to be intact sclera and conjunctive are clear she has prescription lenses  Oropharynx is clear mucous membranes moist.  Chest she does have a small amount of expiratory wheezing there is no labored breathing per patient this is baseline  Heart is regular rate and rhythm she has chronic lower extremity edema possibly a little bit more on the right versus the left pedal pulses somewhat reduced on the right compared to the left  Abdomen is soft obese nontender hypoactive bowel sounds I suspect secondary to obesity.  Musculoskeletal is able to move all extremities x4 grip strength appears to be strong bilaterally.  Has limited range of motion of her knees bilaterally with obese changes- there is some tenderness to palpation of her right knee area with bruising.  Neurologic is grossly intact could not really appreciate lateralizing  findings her speech is clear  Psych she is alert and oriented pleasant and appropriate  Labs reviewed: Recent Labs    09/06/18 0556 09/06/18 0825 09/07/18 1140 09/13/18 0003 09/13/18 0843  NA 142  --  140 138 136  K 3.3*  --  4.3 3.4* 3.6  CL 103  --  107 99  --   CO2 27  --  25 27  --   GLUCOSE 112*  --  99 125*  --   BUN 25*  --  22 25*  --   CREATININE 1.97*  --  1.60* 1.64*  --   CALCIUM 8.4*  --  8.4* 8.6*  --   MG  --  1.6* 1.8  --   --   PHOS  --  4.1 3.4  --   --    Recent Labs    09/01/18 1250 09/06/18 0556 09/07/18 1140 09/13/18 0003  AST 27 22  --  20  ALT 16 13  --  16  ALKPHOS 111 99  --  122  BILITOT 0.7 0.4  --  0.6  PROT 7.1 6.3*  --  6.5  ALBUMIN 3.5 3.1* 3.1* 3.2*   Recent Labs    09/01/18 1250  09/06/18 0556 09/07/18 1140 09/13/18 0003 09/13/18 0843  WBC 4.6  --  4.5 4.1 5.4  --   NEUTROABS 2.2  --  2.1 2.2  --   --   HGB 12.0   < > 11.2* 11.0* 11.1* 11.2*  HCT 39.9   < > 38.4 37.3 36.0 33.0*  MCV 73.6*  --  72.7* 72.1* 71.6*  --   PLT 177  --  172 144* 170  --    < > = values in this interval not displayed.   Lab Results  Component Value Date   TSH 3.934 05/21/2014   Lab Results  Component Value Date   HGBA1C 6.0 (H) 09/07/2018   Lab Results  Component Value Date   CHOL 157 09/07/2018   HDL 41 09/07/2018   LDLCALC 95 09/07/2018   TRIG 104 09/07/2018   CHOLHDL 3.8 09/07/2018    Significant Diagnostic Results in last 30 days:  Ct Abdomen Pelvis Wo Contrast  Result Date: 09/13/2018 CLINICAL DATA:  Abdominal pain.  Lung carcinoma. EXAM: CT ABDOMEN AND PELVIS WITHOUT CONTRAST TECHNIQUE: Multidetector CT imaging of the abdomen and pelvis was performed following the standard protocol without IV contrast. Oral contrast was administered. COMPARISON:  PET-CT May 20, 2015 FINDINGS: Lower chest: There is bibasilar atelectatic change. No consolidation in the lung bases. There is a small hiatal hernia. Hepatobiliary: There is a 7 mm  probable cyst in the posterior dome of the liver region. No other focal liver lesions are apparent on this noncontrast enhanced study. Gallbladder wall is not appreciably thickened. There is no biliary duct dilatation. Pancreas: No pancreatic mass or inflammatory focus. Spleen: No splenic lesions are evident. Adrenals/Urinary Tract: Adrenals bilaterally appear unremarkable. Kidneys bilaterally show no appreciable mass or hydronephrosis on either side. There is no demonstrable renal or ureteral calculus on either side. Urinary bladder is midline with wall thickness within normal limits. Stomach/Bowel: There are scattered sigmoid diverticula without diverticulitis. There is no appreciable bowel wall or mesenteric thickening. There is no evident bowel obstruction. There is no free air or portal venous air. There is lipomatous infiltration of the ileocecal valve. Vascular/Lymphatic: There is aortic tortuosity without aneurysm. No arterial vascular calcification is evident. There is no evident adenopathy in the abdomen or pelvis. Reproductive: The uterus is anteverted.  No pelvic masses evident. Other: The appendix appears unremarkable. There is no abscess or ascites in the abdomen or pelvis. Musculoskeletal: There is degenerative change in the lumbar spine. There are no blastic or lytic bone lesions. There is no intramuscular or chest  wall lesion evident. IMPRESSION: 1. A cause for patient's symptoms has not been established with this study. 2. Appendix appears normal. No evident bowel wall thickening or bowel obstruction. There are sigmoid diverticula without diverticulitis. 3. No evident renal or ureteral calculus. No hydronephrosis. Urinary bladder wall thickness is within normal limits. 4.  Small hiatal hernia. Electronically Signed   By: Lowella Grip III M.D.   On: 09/13/2018 07:10   Dg Chest 2 View  Result Date: 09/13/2018 CLINICAL DATA:  68 y/o F; shortness of breath tonight with left flank pain and  weakness. EXAM: CHEST - 2 VIEW COMPARISON:  09/06/2018 chest radiograph FINDINGS: Stable cardiomegaly given projection and technique. Loop recorder device. Right mid lung zone platelike atelectasis. Low lung volumes. No consolidation, effusion, or pneumothorax. No acute osseous abnormality is evident. IMPRESSION: Right mid lung zone platelike atelectasis. Low lung volumes. Stable cardiomegaly. Electronically Signed   By: Kristine Garbe M.D.   On: 09/13/2018 04:32    Assessment/Plan  #1 history of abdominal pain again this was worked up in the ER with a CT scan which was negative lab work appeared to be fairly unremarkable lipase was not elevated-she says her abdominal discomfort is better today.  2.  History of recent CVA she is now on aspirin 325 mg a day she is also on a statin- at this point appears to be stable  She did have a loop recorder which is followed by cardiology.  3.  History of COPD she does have an order for PRN albuterol at this point appears to be stable he does have a small amount of wheezing but she says this is baseline with a history of COPD as well as asthma..  She is also on Symbicort  Is not complaining of any shortness of breath or discomfort.  4.-  History of CHF- suspect diastolic- her Lasix has been restarted as well as potassium- apparently this was held during previous hospitalization possibly due to renal issues.  She is now back on Lasix 40 mg a day and potassium 20 mEq a day will update a metabolic panel tomorrow.  Potassium was slightly low at 3.4 in ER yesterday.   We will order weights Monday Wednesday Friday notify provider of gain greater than 3 pounds  5.  History of gout long-term per patient she is on Uloric.  6- history of allergic rhinitis she continues on singular:  #7 history of low magnesium apparently this normalized during recent hospitalization at 1.8 will have this updated as well.  8.  History of constipation she is on  Colace twice daily.  9.  History of sickle cell trait at this point will monitor   10 history of lung cancer she did have successful radiation back in 2013 she has been followed by pulmonology-Dr. Melvyn Novas  #11 history of lower extremity edema- per patient this is relatively baseline-she says she did have a Doppler of her right leg done in the hospital but I do not see the results in the epic-will check a Doppler of the right lower extremity secondary to the edema and pain- apparently the Doppler was done about a week ago in the hospital and negative per patient  11 History of anemia with likely an element of chronic disease- hemoglobin was 11.2 on lab done in ER will have this updated  #12 history of renal insufficiency creatinine was 1.64 on hospital lab yesterday this appears relatively baseline with recent values again will have this updated tomorrow.  13.  History of prediabetes-hemoglobin A1c was 6.0 in hospital this was done a week ago-we will check CBGs daily for now    [id number]-of note  greater than 45 minutes spent assessing patient reviewing her chart and labs- coordinating and formulating a plan of care for numerous diagnoses of note greater than 50% of time spent coordinating a plan of care with input as noted above

## 2018-09-14 NOTE — Clinical Social Work Placement (Signed)
   CLINICAL SOCIAL WORK PLACEMENT  NOTE  Date:  09/14/2018  Patient Details  Name: Pamela [name] MRN: [id number] Date of Birth: 06/20/1951  Clinical Social Work is seeking post-discharge placement for this patient at the [place] level of care (*CSW will initial, date and re-position this form in  chart as items are completed):  Yes   Patient/family provided with [facility] Work Department's list of facilities offering this level of care within the geographic area requested by the patient (or if unable, by the patient's family).  Yes   Patient/family informed of their freedom to choose among providers that offer the needed level of care, that participate in Medicare, Medicaid or managed care program needed by the patient, have an available bed and are willing to accept the patient.  Yes   Patient/family informed of [facility]'s ownership interest in [facility] and [facility], as well as of the fact that they are under no obligation to receive care at these facilities.  PASRR submitted to EDS on       PASRR number received on       Existing PASRR number confirmed on 09/13/18     FL2 transmitted to all facilities in geographic area requested by pt/family on       FL2 transmitted to all facilities within larger geographic area on 09/13/18     Patient informed that his/her managed care company has contracts with or will negotiate with certain facilities, including the following:        Yes   Patient/family informed of bed offers received.  Patient chooses bed at [facility] recommends and patient chooses bed at      Patient to be transferred to [facility] on 09/13/18.  Patient to be transferred to facility by [facility]     Patient family notified on 09/13/18 of transfer.  Name of family member notified:  [name]     PHYSICIAN       Additional Comment:     _______________________________________________ Wetzel Bjornstad, [place] 09/14/2018, 8:16 AM

## 2018-09-14 NOTE — Clinical Social Work Placement (Signed)
   CLINICAL SOCIAL WORK PLACEMENT  NOTE  Date:  09/14/2018  Patient Details  Name: Pamela [name] MRN: [id number] Date of Birth: 1951-01-09  Clinical Social Work is seeking post-discharge placement for this patient at the [facility] level of care (*CSW will initial, date and re-position this form in  chart as items are completed):  Yes   Patient/family provided with [facility] Work Department's list of facilities offering this level of care within the geographic area requested by the patient (or if unable, by the patient's family).  Yes   Patient/family informed of their freedom to choose among providers that offer the needed level of care, that participate in Medicare, Medicaid or managed care program needed by the patient, have an available bed and are willing to accept the patient.  Yes   Patient/family informed of [facility]'s ownership interest in [facility] and [facility], as well as of the fact that they are under no obligation to receive care at these facilities.  PASRR submitted to EDS on       PASRR number received on       Existing PASRR number confirmed on 09/13/18     FL2 transmitted to all facilities in geographic area requested by pt/family on       FL2 transmitted to all facilities within larger geographic area on 09/13/18     Patient informed that his/her managed care company has contracts with or will negotiate with certain facilities, including the following:        Yes   Patient/family informed of bed offers received.  Patient chooses bed at [facility] recommends and patient chooses bed at      Patient to be transferred to [facility] on 09/13/18.  Patient to be transferred to facility by [facility]     Patient family notified on 09/13/18 of transfer.  Name of family member notified:  Candace Mattila     PHYSICIAN       Additional Comment:     _______________________________________________ Wetzel Bjornstad, [place] 09/14/2018, 8:17 AM

## 2018-09-15 ENCOUNTER — Ambulatory Visit: Payer: Medicare Other | Admitting: Family Medicine

## 2018-09-15 ENCOUNTER — Non-Acute Institutional Stay (SKILLED_NURSING_FACILITY): Payer: Medicare Other | Admitting: Internal Medicine

## 2018-09-15 ENCOUNTER — Encounter: Payer: Self-pay | Admitting: Internal Medicine

## 2018-09-15 DIAGNOSIS — I5032 Chronic diastolic (congestive) heart failure: Secondary | ICD-10-CM | POA: Diagnosis not present

## 2018-09-15 DIAGNOSIS — M25561 Pain in right knee: Secondary | ICD-10-CM | POA: Diagnosis not present

## 2018-09-15 DIAGNOSIS — I639 Cerebral infarction, unspecified: Secondary | ICD-10-CM

## 2018-09-15 NOTE — Assessment & Plan Note (Signed)
Neurology follow-up 4 weeks

## 2018-09-15 NOTE — Assessment & Plan Note (Signed)
Orthopedic referral.

## 2018-09-15 NOTE — Assessment & Plan Note (Addendum)
Proggressive edema off Lasix Lasix 40 mg bid; BMET 09/19/2018

## 2018-09-15 NOTE — Progress Notes (Signed)
NURSING HOME LOCATION:  [facility] ROOM NUMBER: [id number]   CODE STATUS:  Full Code  PCP: Lanae Boast, FNP @ [facility] Neph[facility] Dr. Archie Balboa   This is a comprehensive admission note to [facility] performed on this date less than 30 days from date of admission. Included are preadmission medical/surgical history; reconciled medication list; family history; social history and comprehensive review of systems.  Corrections and additions to the records were documented. Comprehensive physical exam was also performed. Additionally a clinical summary was entered for each active diagnosis pertinent to this admission in the Problem List to enhance continuity of care.  HPI: The patient was hospitalized originally 2/18-2/21/2020 with CVA, having been found on the floor with altered mental status.  Hematoma of the left forehead was documented as well as a hematoma around the right knee.  Epistaxis from the left nostril was documented as well.  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  Prior to admission the patient had been on 81 mg of aspirin.  Neurology consulted and directed the patient's management during hospital stay.  Echo revealed an aneurysmal interatrial septum.  TEE revealed small PFO with right-to-left shunt.  Aspirin was increased to 325 mg daily.  Loop recorder was implanted by the electrophysiology team to rule out paroxysmal A. fib.  Felt to be clinically stable, the patient was discharged home. Patient returned to the ED 2/25 with multiple complaints.  She described residual pain in her forehead, chest,& knees related to the previous fall.  As of 2/24 she describes increasing generalized weakness and abdominal pain, chiefly right-sided.  This was associated with nausea without vomiting.  Family stated that since discharge the patient had been sleeping all day and was unable to ambulate around her home because of generalized weakness. Lasix had been stopped  at the time of discharge and at home the patient had progressive extremity edema with associated shortness of breath.  On exam she did have 2+ pitting edema. BNP was normal and there was no hypoxia.  Chest x-ray did not reveal fluid overload.  Labs revealed a creatinine of 1.64, GFR 38, and albumin of 3.2. Because of the weakness and debilitation, arrangements were made for discharge to SNF for PT/OT.  Past medical and surgical history: Includes prediabetes, essential hypertension, morbid obesity, obstructive sleep apnea with CPAP non-compliance, sickle cell trait, remote sarcoidosis, morbid obesity, right upper lobe non-small cell carcinoma , congenital single kidney; COPD, and congestive heart failure.  Surgeries include lung biopsy but no resection.She completed radiation therapy.  Social history: Never smoked, nondrinker.  Family history: Reviewed   Review of systems: She is alert and interactive.  Her only complaint is pain in the right knee.  She had described prior "gas in the back".  Daughter-in-law was concerned as she thought the patient was not getting the full dose aspirin here.  Also the daughter-in-law stated that she was on 80 mg of Lasix in the morning and 40 mg the evening @ home. The patient denies any active cardiopulmonary symptoms such as paroxysmal nocturnal dyspnea.  She feels her edema is stable.  Constitutional: No fever, significant weight change, fatigue  Eyes: No redness, discharge, pain, vision change ENT/mouth: No nasal congestion, purulent discharge, earache, change in hearing, sore throat  Cardiovascular: No chest pain, palpitations, paroxysmal nocturnal dyspnea, claudication  Respiratory: No cough, sputum production, hemoptysis, DOE, significant snoring, apnea Gastrointestinal: No heartburn, dysphagia, abdominal pain, nausea /vomiting, rectal bleeding, melena, change in bowels Genitourinary: No dysuria, hematuria, pyuria,  incontinence, nocturia Dermatologic: No  rash, pruritus, change in appearance of skin Neurologic: No dizziness, headache, syncope, seizures, numbness, tingling Psychiatric: No significant anxiety, depression, insomnia, anorexia Endocrine: No change in hair/skin/nails, excessive thirst, excessive hunger, excessive urination  Hematologic/lymphatic: No significant bruising, lymphadenopathy, abnormal bleeding Allergy/immunology: No itchy/watery eyes, significant sneezing, urticaria, angioedema  Physical exam:  Pertinent or positive findings: She is morbidly obese.  As noted she is alert ,communicative and has a sense of humor.  There is resolving ecchymosis over the left forehead.  There is an associated boss. She denies any associated pain in this area.  She has ecchymoses in the periorbital areas bilaterally in a "raccoon" distribution.  There is a grade 1/2 systolic murmur at the left base.  She has minor rhonchi in a scattered distribution.  Abdomen is massive as is the panniculus.  I palpated over the mid abdominal area only to realize that what I thought was the abdomen was massive, pendulous breasts. I apologized profusely ;she was most gracious and said " I am just full boosomed".  She has tense edema of the lower extremities.  Pedal pulses are not palpable.  There is thickened hyperpigmented skin changes over the shins.  The left upper extremity is weaker to opposition than the right upper extremity.  The knees are markedly fusiformly enlarged and there is suggestion of an effusion of the right knee.  There is tenderness to palpation in this area.  General appearance:  no acute distress, increased work of breathing is present.   Lymphatic: No lymphadenopathy about the head, neck, axilla. Eyes: No conjunctival inflammation or lid edema is present. There is no scleral icterus. Ears:  External ear exam shows no significant lesions or deformities.   Nose:  External nasal examination shows no deformity or inflammation. Nasal mucosa are pink  and moist without lesions, exudates Oral exam: Lips and gums are healthy appearing.There is no oropharyngeal erythema or exudate. Neck:  No thyromegaly, masses, tenderness noted.    Heart:  Normal rate and regular rhythm. S1 and S2 normal without gallop, click, rub.  Lungs:  without wheezes, rales, rubs. Abdomen: Bowel sounds are normal.  Abdomen is soft and nontender with no organomegaly, hernias, masses. GU: Deferred  Extremities:  No cyanosis, clubbing Neurologic exam: Balance, Rhomberg, finger to nose testing could not be completed due to clinical state Skin: Warm & dry w/o tenting.  See clinical summary under each active problem in the Problem List with associated updated therapeutic plan

## 2018-09-16 ENCOUNTER — Ambulatory Visit: Payer: Medicare Other | Admitting: Family Medicine

## 2018-09-16 NOTE — Patient Instructions (Signed)
See assessment and plan under each diagnosis in the problem list and acutely for this visit 

## 2018-09-20 LAB — BASIC METABOLIC PANEL
BUN: 30 — AB (ref 4–21)
Creatinine: 1.4 — AB (ref 0.5–1.1)
Glucose: 97
Potassium: 4.1 (ref 3.4–5.3)
Sodium: 143 (ref 137–147)

## 2018-09-21 ENCOUNTER — Non-Acute Institutional Stay (SKILLED_NURSING_FACILITY): Payer: Medicare Other | Admitting: Internal Medicine

## 2018-09-21 ENCOUNTER — Encounter: Payer: Self-pay | Admitting: Internal Medicine

## 2018-09-21 DIAGNOSIS — N289 Disorder of kidney and ureter, unspecified: Secondary | ICD-10-CM | POA: Diagnosis not present

## 2018-09-21 DIAGNOSIS — I5030 Unspecified diastolic (congestive) heart failure: Secondary | ICD-10-CM

## 2018-09-21 DIAGNOSIS — M25561 Pain in right knee: Secondary | ICD-10-CM

## 2018-09-21 DIAGNOSIS — I639 Cerebral infarction, unspecified: Secondary | ICD-10-CM

## 2018-09-21 LAB — BASIC METABOLIC PANEL  EGFR
CALCIUM: 8.2
Carbon Dioxide, Total: 27
Chloride: 101

## 2018-09-21 NOTE — Progress Notes (Signed)
Location:    [facility] Room Number: 210/A Place of Service:  SNF [phone]) Provider:  Elesa Massed, FNP  Patient Care Team: Lanae Boast, FNP as PCP - General (Family Medicine)  Extended Emergency Contact Information Primary Emergency Contact: Choo,Orlandis Address: [place], [place] [address] John[place] Litter of [place] Phone: [phone] Work Phone: [phone] Mobile Phone: [phone] Relation: Son Secondary Emergency Contact: Krasinski,Candace  [place] Phone: [phone] Relation: Daughter  Code Status:  Full Code Goals of care: Advanced Directive information Advanced Directives 09/21/2018  Does Patient Have a Medical Advance Directive? Yes  Type of Advance Directive (No Data)  Does patient want to make changes to medical advance directive? No - Patient declined  Would patient like information on creating a medical advance directive? No - Patient declined     Chief Complaint  Patient presents with  . Acute Visit    1 week Hospitalization F/U     HPI:  Pt is a 68 y.o. female seen today for an acute visit for  Follow-up after admission to facility last week.  Patient is here for short-term rehab after hospitalization for a CVA she is on aspirin 325 mg a day as well as a statin and will have neurology follow-up apparently she is doing fairly well with therapy.  She also has a history of diastolic CHF and Dr. Linna Darner did increase her Lasix to 40 mg twice daily last week- update weight is pending but patient states she has been relatively stable.  Her edema appears to be stable.  She is not complaining of any shortness of breath she does have a history of COPD and continues on Symbicort as well as as needed albuterol nebulizers.  She also has a history of right knee pain Dr. Linna Darner has ordered an orthopedic consult will write an order to check on progress of  this  She does have PRN Tylenol for pain.   In regards to renal insufficiency her creatinine has shown improvement it is 1.41 on lab done yesterday with a BUN of 29 --her creatinine was around 1.6 on hospital discharge.  Currently she is sitting in her wheelchair comfortably she does not really have any complaints she would like to see the orthopedic physician however about her knee discomfort      Past Medical History:  Diagnosis Date  . Arthritis   . Asthma   . Cancer ([facility])    lung, adenocarcinoma right lung 2012  . CHF (congestive heart failure) ([facility])    Preserved EF  . Congenital single kidney    With chronic kidney disease  . COPD (chronic obstructive pulmonary disease) ([facility])   . Gout   . Hypertension   . Lung cancer [facility]) 2012   Right upper lobe lung adenocarcinoma diagnosed with needle biopsy treated by SBRT finished treatment April 2013 has been monitored since  . Mass of chest wall, right    Right chest wall mass 7.3 cm biopsy on 12/13/2013. Patient notes it was consistent with sarcoidosis but actual pathology results not available.  . Oxygen deficiency   . Sarcoidosis   . Sickle cell trait ([facility])   . Sleep apnea    Past Surgical History:  Procedure Laterality Date  . LOOP RECORDER INSERTION N/A 09/08/2018   Procedure: LOOP RECORDER INSERTION;  Surgeon: [place] Lance, MD;  Location: [facility] INVASIVE CV  LAB;  Service: Cardiovascular;  Laterality: N/A;  . LUNG BIOPSY    . TEE WITHOUT CARDIOVERSION N/A 09/08/2018   Procedure: TRANSESOPHAGEAL ECHOCARDIOGRAM (TEE);  Surgeon: Josue Hector, MD;  Location: [facility] ENDOSCOPY;  Service: Cardiovascular;  Laterality: N/A;  with loop  . TUBAL LIGATION    . VEIN LIGATION AND STRIPPING      Allergies  Allergen Reactions  . Sulfur Rash    Outpatient Encounter Medications as of 09/21/2018  Medication Sig  . acetaminophen (TYLENOL) 325 MG tablet Take 650 mg by mouth every 6 (six) hours as needed for mild pain.  [name] Kitchen albuterol (PROAIR  HFA) 108 (90 Base) MCG/ACT inhaler Inhale 2 puffs into the lungs every 6 (six) hours as needed for wheezing or shortness of breath.  [name] Kitchen albuterol (PROVENTIL) (2.5 MG/3ML) 0.083% nebulizer solution Take 3 mLs (2.5 mg total) by nebulization every 6 (six) hours as needed for wheezing or shortness of breath.  [name] Kitchen aspirin 325 MG tablet Take 1 tablet (325 mg total) by mouth daily.  [name] Kitchen atorvastatin (LIPITOR) 40 MG tablet Take 1 tablet (40 mg total) by mouth daily at 6 PM.  . budesonide-formoterol (SYMBICORT) 160-4.5 MCG/ACT inhaler Inhale 2 puffs into the lungs 2 (two) times daily.  . calcitRIOL (ROCALTROL) 0.25 MCG capsule Take 1 capsule (0.25 mcg total) by mouth daily.  [name] Kitchen docusate sodium (COLACE) 100 MG capsule Take 1 capsule (100 mg total) by mouth 2 (two) times daily.  . febuxostat (ULORIC) 40 MG tablet Take 1 tablet (40 mg total) by mouth daily.  . furosemide (LASIX) 40 MG tablet Take 40 mg by mouth 2 (two) times daily. For CHF  . montelukast (SINGULAIR) 10 MG tablet Take 1 tablet (10 mg total) by mouth at bedtime.  . potassium chloride SA (K-DUR,KLOR-CON) 20 MEQ tablet Take 1 tablet (20 mEq total) by mouth daily.  . [DISCONTINUED] furosemide (LASIX) 40 MG tablet Take 1 tablet (40 mg total) by mouth daily. (Patient taking differently: Take 40 mg by mouth 2 (two) times daily. )   No facility-administered encounter medications on file as of 09/21/2018.     Review of Systems   General she is not complaining of any fever or chills.  Skin does not complain of rashes or itching.  Head ears eyes nose mouth and throat is not complaining of visual changes or sore throat.  Respiratory is not complaining of shortness of breath, cough does have a history of COPD and has some mild expiratory wheezing which she describes as baseline.  Cardiac is not complaining of chest pain appears to have relatively baseline lower extremity edema.  GI is not complaining of abdominal pain nausea vomiting diarrhea or  constipation.  GU is not complaining of dysuria.  Musculoskeletal does complain of some continued right knee discomfort.  Neurologic does not complain of dizziness headache or numbness.  And psych does not complain of being depressed or anxious  Immunization History  Administered Date(s) Administered  . Influenza,inj,Quad PF,6+ Mos 04/13/2014, 04/16/2015, 04/03/2016, 05/19/2017, 06/15/2018  . Pneumococcal Conjugate-13 04/03/2016  . Pneumococcal Polysaccharide-23 04/13/2014  . Tdap 01/02/2015  . Zoster 05/21/2014   Pertinent  Health Maintenance Due  Topic Date Due  . PAP SMEAR-Modifier  10/13/2018 (Originally 01/01/2018)  . DEXA SCAN  10/13/2018 (Originally 11/06/2017)  . PNA vac Low Risk Adult (2 of 2 - PPSV23) 04/14/2019  . MAMMOGRAM  05/05/2020  . COLONOSCOPY  12/17/2026   Fall Risk  09/05/2018 09/01/2018 06/15/2018 03/14/2018 01/26/2018  Falls in the past year? 0  0 0 No No  Risk for fall due to : - - - - -   Functional Status Survey:    Vitals:   09/21/18 1038  BP: 118/72  Pulse: 78  Resp: 20  Temp: 98.7 F (37.1 C)  TempSrc: Oral    Physical Exam   In general this is a pleasant female in no distress sitting comfortably in her wheelchair.  Her skin is warm and dry.  Eyes visual acuity appears to be intact.  Oropharynx is clear mucous membranes moist.  Chest she does have some slight expiratory wheezing at baseline with previous exam there is no labored breathing.  Heart is regular rate and rhythm without murmur gallop or rub she continues with what appears to be baseline tense lower extremity edema bilaterally.  Abdomen is obese soft nontender with somewhat hypoactive bowel sounds.  Musculoskeletal continues with lower extremity weakness-limited exam since she has somewhat tight pants on so was difficult to visualize the knees but there is some continued tenderness to palpation of the right knee area.  Neurologic appears to be grossly intact her speech is  clear cranial nerves intact.  Psych she is alert and oriented pleasant and appropriate continues to be in good spirits  Labs reviewed: Recent Labs    09/06/18 0556 09/06/18 0825 09/07/18 1140 09/13/18 0003 09/13/18 0843  NA 142  --  140 138 136  K 3.3*  --  4.3 3.4* 3.6  CL 103  --  107 99  --   CO2 27  --  25 27  --   GLUCOSE 112*  --  99 125*  --   BUN 25*  --  22 25*  --   CREATININE 1.97*  --  1.60* 1.64*  --   CALCIUM 8.4*  --  8.4* 8.6*  --   MG  --  1.6* 1.8  --   --   PHOS  --  4.1 3.4  --   --    Recent Labs    09/01/18 1250 09/06/18 0556 09/07/18 1140 09/13/18 0003  AST 27 22  --  20  ALT 16 13  --  16  ALKPHOS 111 99  --  122  BILITOT 0.7 0.4  --  0.6  PROT 7.1 6.3*  --  6.5  ALBUMIN 3.5 3.1* 3.1* 3.2*   Recent Labs    09/01/18 1250  09/06/18 0556 09/07/18 1140 09/13/18 0003 09/13/18 0843  WBC 4.6  --  4.5 4.1 5.4  --   NEUTROABS 2.2  --  2.1 2.2  --   --   HGB 12.0   < > 11.2* 11.0* 11.1* 11.2*  HCT 39.9   < > 38.4 37.3 36.0 33.0*  MCV 73.6*  --  72.7* 72.1* 71.6*  --   PLT 177  --  172 144* 170  --    < > = values in this interval not displayed.   Lab Results  Component Value Date   TSH 3.934 05/21/2014   Lab Results  Component Value Date   HGBA1C 6.0 (H) 09/07/2018   Lab Results  Component Value Date   CHOL 157 09/07/2018   HDL 41 09/07/2018   LDLCALC 95 09/07/2018   TRIG 104 09/07/2018   CHOLHDL 3.8 09/07/2018    Significant Diagnostic Results in last 30 days:  Ct Abdomen Pelvis Wo Contrast  Result Date: 09/13/2018 CLINICAL DATA:  Abdominal pain.  Lung carcinoma. EXAM: CT ABDOMEN AND PELVIS WITHOUT CONTRAST TECHNIQUE: Multidetector CT  imaging of the abdomen and pelvis was performed following the standard protocol without IV contrast. Oral contrast was administered. COMPARISON:  PET-CT May 20, 2015 FINDINGS: Lower chest: There is bibasilar atelectatic change. No consolidation in the lung bases. There is a small hiatal hernia.  Hepatobiliary: There is a 7 mm probable cyst in the posterior dome of the liver region. No other focal liver lesions are apparent on this noncontrast enhanced study. Gallbladder wall is not appreciably thickened. There is no biliary duct dilatation. Pancreas: No pancreatic mass or inflammatory focus. Spleen: No splenic lesions are evident. Adrenals/Urinary Tract: Adrenals bilaterally appear unremarkable. Kidneys bilaterally show no appreciable mass or hydronephrosis on either side. There is no demonstrable renal or ureteral calculus on either side. Urinary bladder is midline with wall thickness within normal limits. Stomach/Bowel: There are scattered sigmoid diverticula without diverticulitis. There is no appreciable bowel wall or mesenteric thickening. There is no evident bowel obstruction. There is no free air or portal venous air. There is lipomatous infiltration of the ileocecal valve. Vascular/Lymphatic: There is aortic tortuosity without aneurysm. No arterial vascular calcification is evident. There is no evident adenopathy in the abdomen or pelvis. Reproductive: The uterus is anteverted.  No pelvic masses evident. Other: The appendix appears unremarkable. There is no abscess or ascites in the abdomen or pelvis. Musculoskeletal: There is degenerative change in the lumbar spine. There are no blastic or lytic bone lesions. There is no intramuscular or chest wall lesion evident. IMPRESSION: 1. A cause for patient's symptoms has not been established with this study. 2. Appendix appears normal. No evident bowel wall thickening or bowel obstruction. There are sigmoid diverticula without diverticulitis. 3. No evident renal or ureteral calculus. No hydronephrosis. Urinary bladder wall thickness is within normal limits. 4.  Small hiatal hernia. Electronically Signed   By: Lowella Grip III M.D.   On: 09/13/2018 07:10   Dg Chest 2 View  Result Date: 09/13/2018 CLINICAL DATA:  68 y/o F; shortness of breath  tonight with left flank pain and weakness. EXAM: CHEST - 2 VIEW COMPARISON:  09/06/2018 chest radiograph FINDINGS: Stable cardiomegaly given projection and technique. Loop recorder device. Right mid lung zone platelike atelectasis. Low lung volumes. No consolidation, effusion, or pneumothorax. No acute osseous abnormality is evident. IMPRESSION: Right mid lung zone platelike atelectasis. Low lung volumes. Stable cardiomegaly. Electronically Signed   By: Kristine Garbe M.D.   On: 09/13/2018 04:32   Dg Chest 2 View  Result Date: 09/01/2018 CLINICAL DATA:  Pt presents with bilateral lower extremity swelling. R>L. Denies SOB or chest pain, pt sent from Dr office, hx COPD, CHF, lung ca, sarcoidosis EXAM: CHEST - 2 VIEW COMPARISON:  12/27/2017 FINDINGS: The heart size and mediastinal contours are within normal limits. Both lungs are clear. The visualized skeletal structures are unremarkable. IMPRESSION: No active cardiopulmonary disease. Electronically Signed   By: Nolon Nations M.D.   On: 09/01/2018 13:58   Ct Head Wo Contrast  Result Date: 09/06/2018 CLINICAL DATA:  Posttraumatic altered mental state status EXAM: CT HEAD WITHOUT CONTRAST CT CERVICAL SPINE WITHOUT CONTRAST TECHNIQUE: Multidetector CT imaging of the head and cervical spine was performed following the standard protocol without intravenous contrast. Multiplanar CT image reconstructions of the cervical spine were also generated. COMPARISON:  Brain MRI 03/01/2018 FINDINGS: CT HEAD FINDINGS Brain: No evidence of acute infarction, hemorrhage, hydrocephalus, extra-axial collection or mass lesion/mass effect. Vascular: No hyperdense vessel or unexpected calcification. Skull: Large left frontal scalp hematoma. No underlying calvarial fracture. Sinuses/Orbits: Negative Other:  Motion degraded such that repeat images were needed. CT CERVICAL SPINE FINDINGS Alignment: Normal Skull base and vertebrae: No acute fracture. No primary bone lesion or  focal pathologic process. Soft tissues and spinal canal: No prevertebral fluid or swelling. No visible canal hematoma. Disc levels:  No significant degenerative change Upper chest: Negative. IMPRESSION: 1. No evidence of intracranial or cervical spine injury. 2. Scalp hematoma without calvarial fracture. Electronically Signed   By: Monte Fantasia M.D.   On: 09/06/2018 06:23   Ct Cervical Spine Wo Contrast  Result Date: 09/06/2018 CLINICAL DATA:  Posttraumatic altered mental state status EXAM: CT HEAD WITHOUT CONTRAST CT CERVICAL SPINE WITHOUT CONTRAST TECHNIQUE: Multidetector CT imaging of the head and cervical spine was performed following the standard protocol without intravenous contrast. Multiplanar CT image reconstructions of the cervical spine were also generated. COMPARISON:  Brain MRI 03/01/2018 FINDINGS: CT HEAD FINDINGS Brain: No evidence of acute infarction, hemorrhage, hydrocephalus, extra-axial collection or mass lesion/mass effect. Vascular: No hyperdense vessel or unexpected calcification. Skull: Large left frontal scalp hematoma. No underlying calvarial fracture. Sinuses/Orbits: Negative Other: Motion degraded such that repeat images were needed. CT CERVICAL SPINE FINDINGS Alignment: Normal Skull base and vertebrae: No acute fracture. No primary bone lesion or focal pathologic process. Soft tissues and spinal canal: No prevertebral fluid or swelling. No visible canal hematoma. Disc levels:  No significant degenerative change Upper chest: Negative. IMPRESSION: 1. No evidence of intracranial or cervical spine injury. 2. Scalp hematoma without calvarial fracture. Electronically Signed   By: Monte Fantasia M.D.   On: 09/06/2018 06:23   Mr Jodene Nam Head Wo Contrast  Result Date: 09/06/2018 CLINICAL DATA:  Followup thalamic stroke. EXAM: MRA HEAD WITHOUT CONTRAST TECHNIQUE: Angiographic images of the Circle of Willis were obtained using MRA technique without intravenous contrast. COMPARISON:  MRI same  day. FINDINGS: Both internal carotid arteries are widely patent into the brain. No siphon stenosis. The anterior and middle cerebral vessels are patent without proximal stenosis, aneurysm or vascular malformation. Both vertebral arteries are widely patent to the basilar. No basilar stenosis. Posterior circulation branch vessels appear normal. IMPRESSION: Normal examination. Specifically, no posterior circulation or basilar tip abnormality seen. Electronically Signed   By: Nelson Chimes M.D.   On: 09/06/2018 14:02   Mr Brain Wo Contrast  Result Date: 09/06/2018 CLINICAL DATA:  History of lung cancer and sarcoidosis. Altered mental status. Found on the floor. EXAM: MRI HEAD WITHOUT CONTRAST TECHNIQUE: Multiplanar, multiecho pulse sequences of the brain and surrounding structures were obtained without intravenous contrast. COMPARISON:  CT same day.  MRI 03/01/2018 FINDINGS: Brain: Diffusion imaging shows acute infarction in both thalami. Each region of infarction measures about a cm in size. The brainstem is otherwise normal. There are a few old small vessel cerebellar infarctions. Cerebral hemispheres otherwise show minimal small vessel change of the white matter. No cortical or large vessel territory infarction. No mass lesion, hemorrhage, hydrocephalus or extra-axial collection. Vascular: Major vessels at the base of the brain show flow. This includes flow being apparent in the vertebrobasilar system. Skull and upper cervical spine: Negative Sinuses/Orbits: Clear/normal Other: Left forehead and frontal scalp hematoma. IMPRESSION: Acute infarction within both the right and left thalami. 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 Left forehead and frontal scalp hematoma. Electronically Signed   By: Nelson Chimes M.D.   On: 09/06/2018 10:44   Dg Chest Portable 1 View  Result Date:  09/06/2018 CLINICAL DATA:  Altered  mental status EXAM: PORTABLE CHEST 1 VIEW COMPARISON:  Five days ago FINDINGS: Low volume chest. There is no edema, consolidation, effusion, or pneumothorax. Linear atelectasis in the right mid lung. Borderline heart size accentuated by low volumes. IMPRESSION: Low volume chest without acute finding. Electronically Signed   By: Monte Fantasia M.D.   On: 09/06/2018 06:38   Dg Knee Right Port  Result Date: 09/06/2018 CLINICAL DATA:  Fall with right knee bruising EXAM: PORTABLE RIGHT KNEE - 1-2 VIEW COMPARISON:  None. FINDINGS: Anterior soft tissue swelling. Chronic fragmentation or articular bodies above the patella. There is tricompartmental degenerative spurring. No acute fracture or subluxation. IMPRESSION: Soft tissue swelling without acute fracture. Electronically Signed   By: Monte Fantasia M.D.   On: 09/06/2018 06:39   Vas US Carotid (at [facility] Only)  Result Date: 09/08/2018 Carotid Arterial Duplex Study Indications: CVA. Limitations: thick neck Performing Technologist: June Leap RDMS, RVT  Examination Guidelines: A complete evaluation includes B-mode imaging, spectral Doppler, color Doppler, and power Doppler as needed of all accessible portions of each vessel. Bilateral testing is considered an integral part of a complete examination. Limited examinations for reoccurring indications may be performed as noted.  Right Carotid Findings: +----------+--------+--------+--------+--------+------------------+           PSV cm/sEDV cm/sStenosisDescribeComments           +----------+--------+--------+--------+--------+------------------+ CCA Prox  54      14                                         +----------+--------+--------+--------+--------+------------------+ CCA Distal45      4                                          +----------+--------+--------+--------+--------+------------------+ ICA Prox  27      10      1-39%           intimal thickening  +----------+--------+--------+--------+--------+------------------+ ICA Distal92      25                                         +----------+--------+--------+--------+--------+------------------+ ECA       68      7                                          +----------+--------+--------+--------+--------+------------------+ +----------+--------+-------+----------------+-------------------+           PSV cm/sEDV cmsDescribe        Arm Pressure (mmHG) +----------+--------+-------+----------------+-------------------+ [id number]            Multiphasic, WNL                    +----------+--------+-------+----------------+-------------------+ +---------+--------+--+--------+--+---------+ VertebralPSV cm/s60EDV cm/s17Antegrade +---------+--------+--+--------+--+---------+  Left Carotid Findings: +----------+--------+--------+--------+--------+------------------+           PSV cm/sEDV cm/sStenosisDescribeComments           +----------+--------+--------+--------+--------+------------------+ CCA Prox  55      14                                         +----------+--------+--------+--------+--------+------------------+  CCA Distal59      11                                         +----------+--------+--------+--------+--------+------------------+ ICA Prox  55      16      1-39%           intimal thickening +----------+--------+--------+--------+--------+------------------+ ICA Distal137     38                                         +----------+--------+--------+--------+--------+------------------+ ECA       54      8                                          +----------+--------+--------+--------+--------+------------------+ +----------+--------+--------+----------------+-------------------+ SubclavianPSV cm/sEDV cm/sDescribe        Arm Pressure (mmHG) +----------+--------+--------+----------------+-------------------+           144              Multiphasic, WNL                    +----------+--------+--------+----------------+-------------------+ +---------+--------+--+--------+--+---------+ VertebralPSV cm/s56EDV cm/s19Antegrade +---------+--------+--+--------+--+---------+  Summary: Right Carotid: Velocities in the right ICA are consistent with a 1-39% stenosis. Left Carotid: Velocities in the left ICA are consistent with a 1-39% stenosis. Vertebrals: Bilateral vertebral arteries demonstrate antegrade flow. *See table(s) above for measurements and observations.  Electronically signed by Antony Contras MD on 09/08/2018 at 11:59:36 AM.    Final    Vas [place] Lower Extremity Venous (dvt)  Result Date: 09/06/2018  Lower Venous Study Indications: Edema.  Limitations: Body habitus and poor ultrasound/tissue interface. Performing Technologist: June Leap RDMS, RVT  Examination Guidelines: A complete evaluation includes B-mode imaging, spectral Doppler, color Doppler, and power Doppler as needed of all accessible portions of each vessel. Bilateral testing is considered an integral part of a complete examination. Limited examinations for reoccurring indications may be performed as noted.  Right Venous Findings: +---------+---------------+---------+-----------+----------+--------------+          CompressibilityPhasicitySpontaneityPropertiesSummary        +---------+---------------+---------+-----------+----------+--------------+ CFV      Full           Yes      Yes                                 +---------+---------------+---------+-----------+----------+--------------+ SFJ      Full                                                        +---------+---------------+---------+-----------+----------+--------------+ FV Prox  Full                                                        +---------+---------------+---------+-----------+----------+--------------+ FV Mid   Full                                                         +---------+---------------+---------+-----------+----------+--------------+  FV DistalFull                                                        +---------+---------------+---------+-----------+----------+--------------+ PFV      Full                                                        +---------+---------------+---------+-----------+----------+--------------+ POP      Full           Yes      Yes                                 +---------+---------------+---------+-----------+----------+--------------+ PTV                                                   Not visualized +---------+---------------+---------+-----------+----------+--------------+ PERO                                                  Not visualized +---------+---------------+---------+-----------+----------+--------------+  Left Venous Findings: +---------+---------------+---------+-----------+----------+--------------+          CompressibilityPhasicitySpontaneityPropertiesSummary        +---------+---------------+---------+-----------+----------+--------------+ CFV      Full           Yes      Yes                                 +---------+---------------+---------+-----------+----------+--------------+ SFJ      Full                                                        +---------+---------------+---------+-----------+----------+--------------+ FV Prox  Full                                                        +---------+---------------+---------+-----------+----------+--------------+ FV Mid   Full                                                        +---------+---------------+---------+-----------+----------+--------------+ FV DistalFull                                                        +---------+---------------+---------+-----------+----------+--------------+  PFV      Full                                                         +---------+---------------+---------+-----------+----------+--------------+ POP      Full           Yes      Yes                                 +---------+---------------+---------+-----------+----------+--------------+ PTV                                                   Not visualized +---------+---------------+---------+-----------+----------+--------------+ PERO                                                  Not visualized +---------+---------------+---------+-----------+----------+--------------+    Summary: Right: There is no evidence of deep vein thrombosis in the lower extremity. However, portions of this examination were limited- see technologist comments above. No cystic structure found in the popliteal fossa. Left: There is no evidence of deep vein thrombosis in the lower extremity. However, portions of this examination were limited- see technologist comments above. No cystic structure found in the popliteal fossa.  *See table(s) above for measurements and observations. Electronically signed by Deitra Mayo MD on 09/06/2018 at 8:03:08 PM.    Final     Assessment/Plan  #1 history of diastolic CHF- at this point continue to monitor weights edema appears relatively baseline-her Lasix is now 40 mg twice daily this was increased last week.  She is not complaining of any increased shortness of breath or chest pain at this point clinically stable continue current dose of Lasix.  2.  History of CVA she continues on a statin as well as aspirin 325 mg a day she also has a loop recorder and does have neurology follow-up-at this point continue therapy.  3.  History of COPD continues on Symbicort as well as as needed albuterol this appears relatively stable and at baseline.  4.  History of renal insufficiency creatinine shows improvement despite the increased dose of Lasix at this point will monitor and update a metabolic panel next week to ensure stability creatinine of  1.41 on lab done yesterday shows improvement.  5.  History of right knee pain-again orthopedic consult is pending at this point continue therapy.  [id number]

## 2018-09-22 ENCOUNTER — Ambulatory Visit: Payer: Medicare Other

## 2018-09-28 ENCOUNTER — Non-Acute Institutional Stay (SKILLED_NURSING_FACILITY): Payer: Medicare Other | Admitting: Internal Medicine

## 2018-09-28 ENCOUNTER — Encounter: Payer: Self-pay | Admitting: Internal Medicine

## 2018-09-28 DIAGNOSIS — R635 Abnormal weight gain: Secondary | ICD-10-CM | POA: Diagnosis not present

## 2018-09-28 DIAGNOSIS — I5032 Chronic diastolic (congestive) heart failure: Secondary | ICD-10-CM | POA: Diagnosis not present

## 2018-09-28 DIAGNOSIS — T50905A Adverse effect of unspecified drugs, medicaments and biological substances, initial encounter: Secondary | ICD-10-CM | POA: Diagnosis not present

## 2018-09-28 DIAGNOSIS — M25561 Pain in right knee: Secondary | ICD-10-CM | POA: Diagnosis not present

## 2018-09-28 DIAGNOSIS — N289 Disorder of kidney and ureter, unspecified: Secondary | ICD-10-CM | POA: Diagnosis not present

## 2018-09-28 NOTE — Progress Notes (Signed)
Location:    [facility] Room Number: 210/A Place of Service:  SNF [phone]) Provider:  Elesa Massed, FNP  Patient Care Team: Lanae Boast, FNP as PCP - General (Family Medicine)  Extended Emergency Contact Information Primary Emergency Contact: Juhnke,Orlandis Address: [place], [place] [address] Johnnette Litter of [place] Phone: [phone] Work Phone: [phone] Mobile Phone: [phone] Relation: Son Secondary Emergency Contact: Deery,Candace  [place] Phone: [phone] Relation: Daughter  Code Status:  Full Code Goals of care: Advanced Directive information Advanced Directives 09/28/2018  Does Patient Have a Medical Advance Directive? Yes  Type of Advance Directive (No Data)  Does patient want to make changes to medical advance directive? No - Patient declined  Would patient like information on creating a medical advance directive? No - Patient declined     Chief Complaint  Patient presents with   Acute Visit    Weight Gain    HPI:  Pt is a 68 y.o. female seen today for an acute visit for what appears to be some weight gain.  Appears her some variable weights but it appears that she is gained about 6 or 7 pounds since her admission in late February.  She does have a history of diastolic CHF- she was seen by Dr. Linna Darner approximately 2 weeks ago and her Lasix was increased up to 40 mg twice daily secondary to concerns of some increased edema- weight appeared to have stabilized but it appears in the last few days has come up somewhat  She is not complaining of any increased shortness of breath says she is doing well with therapy.  She does say she feels she is having some increased edema especially on the right she is always had chronically it appears increased edema on the right versus the left.  She also continues to complain of right knee pain and  orthopedic consult has been ordered and apparently is pending  Her other diagnoses include a recent CVA which required hospitalization she is on aspirin 325 a day and a statin and will have neurology follow-up she does have a loop recorder.  Regards to COPD she continues on Symbicort as well as as needed albuterol.   She does have chronic kidney disease creatinine appears to be more in the mid ones recently- update lab done 2 days ago showed a creatinine of 1.64 BUN of 29 potassium of 4.2 and sodium of 142- CO2 level was mildly elevated at 33.    Currently she is sitting in her wheelchair comfortably vital signs appear to be stable   Past Medical History:  Diagnosis Date   Arthritis    Asthma    Cancer ([facility])    lung, adenocarcinoma right lung 2012   CHF (congestive heart failure) ([facility])    Preserved EF   Congenital single kidney    With chronic kidney disease   COPD (chronic obstructive pulmonary disease) ([facility])    Gout    Hypertension    Lung cancer ([facility]) 2012   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 Patient notes it was consistent with sarcoidosis but actual pathology results not available.   Oxygen deficiency    Sarcoidosis  Sickle cell trait ([facility])    Sleep apnea    Past Surgical History:  Procedure Laterality Date   LOOP RECORDER INSERTION N/A 09/08/2018   Procedure: LOOP RECORDER INSERTION;  Surgeon: Evans Lance, MD;  Location: [facility];  Service: Cardiovascular;  Laterality: N/A;   LUNG BIOPSY     TEE WITHOUT CARDIOVERSION N/A 09/08/2018   Procedure: TRANSESOPHAGEAL ECHOCARDIOGRAM (TEE);  Surgeon: Josue Hector, MD;  Location: [facility] ENDOSCOPY;  Service: Cardiovascular;  Laterality: N/A;  with loop   TUBAL LIGATION     VEIN LIGATION AND STRIPPING       Allergies  Allergen Reactions   Sulfur Rash    Outpatient Encounter Medications as of 09/28/2018  Medication Sig   acetaminophen (TYLENOL) 325 MG tablet Take 650 mg by mouth every 6 (six) hours as needed for mild pain.   albuterol (PROAIR HFA) 108 (90 Base) MCG/ACT inhaler Inhale 2 puffs into the lungs every 6 (six) hours as needed for wheezing or shortness of breath.   albuterol (PROVENTIL) (2.5 MG/3ML) 0.083% nebulizer solution Take 3 mLs (2.5 mg total) by nebulization every 6 (six) hours as needed for wheezing or shortness of breath.   aspirin 325 MG tablet Take 1 tablet (325 mg total) by mouth daily.   atorvastatin (LIPITOR) 40 MG tablet Take 1 tablet (40 mg total) by mouth daily at 6 PM.   budesonide-formoterol (SYMBICORT) 160-4.5 MCG/ACT inhaler Inhale 2 puffs into the lungs 2 (two) times daily.   calcitRIOL (ROCALTROL) 0.25 MCG capsule Take 1 capsule (0.25 mcg total) by mouth daily.   docusate sodium (COLACE) 100 MG capsule Take 1 capsule (100 mg total) by mouth 2 (two) times daily.   febuxostat (ULORIC) 40 MG tablet Take 1 tablet (40 mg total) by mouth daily.   furosemide (LASIX) 40 MG tablet Take 40 mg by mouth 2 (two) times daily. For CHF   montelukast (SINGULAIR) 10 MG tablet Take 1 tablet (10 mg total) by mouth at bedtime.   potassium chloride SA (K-DUR,KLOR-CON) 20 MEQ tablet Take 1 tablet (20 mEq total) by mouth daily.   No facility-administered encounter medications on file as of 09/28/2018.     Review of Systems   In general she is not complaining of any fever chills she appears to have gained some weight.  Skin does not complain of rashes or itching does have venous stasis changes lower extremities most prominently on her right lower extremity with some erythematous changes.  Head ears eyes nose mouth and throat is not complaining of visual changes or sore throat.  Respiratory does not complain of shortness of breath or cough she does have a history of  COPD and has some chronic mild wheezing.  Cardiac is not complaining of chest pain continues to have lower extremity edema right versus left.  GI is not complaining of abdominal pain nausea vomiting diarrhea or constipation.  GU is not complaining of dysuria.  Musculoskeletal continues to complain of right knee pain she says this is been fairly chronic.  Neurologic is not complaining of dizziness headache numbness or syncope.  Psych does not complain of being depressed or anxious  Immunization History  Administered Date(s) Administered   Influenza,inj,Quad PF,6+ Mos 04/13/2014, 04/16/2015, 04/03/2016, 05/19/2017, 06/15/2018   Pneumococcal Conjugate-13 04/03/2016   Pneumococcal Polysaccharide-23 04/13/2014   Tdap 01/02/2015   Zoster 05/21/2014   Pertinent  Health Maintenance Due  Topic Date Due   PAP SMEAR-Modifier  10/13/2018 (Originally 01/01/2018)   DEXA SCAN  10/13/2018 (  Originally 11/06/2017)   PNA vac Low Risk Adult (2 of 2 - PPSV23) 04/14/2019   MAMMOGRAM  05/05/2020   COLONOSCOPY  12/17/2026   Fall Risk  09/05/2018 09/01/2018 06/15/2018 03/14/2018 01/26/2018  Falls in the past year? 0 0 0 No No  Risk for fall due to : - - - - -   Functional Status Survey:    Vitals:   09/28/18 1145  BP: 124/72  Pulse: 84  Resp: 20  Temp: 98 F (36.7 C)  TempSrc: Oral  --Weight is 383.5 pounds appears was 376 on admission in late February  Physical Exam   General this is a pleasant elderly female in no distress sitting comfortably in her wheelchair.  Her skin is warm and dry she does have venous stasis changes lower extremities bilaterally more prominently on the right leg- there is some warmth of both legs more so on the right.  Per nursing the erythema on the right leg is chronic and unchanged.  Oropharynx is clear mucous membranes moist.  Chest continues to have some slight expiratory wheezing at baseline-there is no labored breathing  Heart is regular rate and  rhythm without murmur gallop or rub but appears to be somewhat increased lower extremity edema more so on the right.  Abdomen is obese soft nontender with positive bowel sounds  Musculoskeletal continues with weakness of her lower extremities bilaterally with edema as noted above--continues to have pain with palpation of her right knee area with some warmth  Homans sign was somewhat equivocal  Neurologic appears grossly intact her speech is clear.  Psych she is alert and oriented remains pleasant and appropriate   Labs reviewed: Recent Labs    09/06/18 0556 09/06/18 0825 09/07/18 1140 09/13/18 0003 09/13/18 0843 09/20/18  NA 142  --  140 138 136 143  K 3.3*  --  4.3 3.4* 3.6 4.1  CL 103  --  107 99  --  101  CO2 27  --  25 27  --  27  GLUCOSE 112*  --  99 125*  --   --   BUN 25*  --  22 25*  --  30*  CREATININE 1.97*  --  1.60* 1.64*  --  1.4*  CALCIUM 8.4*  --  8.4* 8.6*  --  8.2  MG  --  1.6* 1.8  --   --   --   PHOS  --  4.1 3.4  --   --   --    Recent Labs    09/01/18 1250 09/06/18 0556 09/07/18 1140 09/13/18 0003  AST 27 22  --  20  ALT 16 13  --  16  ALKPHOS 111 99  --  122  BILITOT 0.7 0.4  --  0.6  PROT 7.1 6.3*  --  6.5  ALBUMIN 3.5 3.1* 3.1* 3.2*   Recent Labs    09/01/18 1250  09/06/18 0556 09/07/18 1140 09/13/18 0003 09/13/18 0843  WBC 4.6  --  4.5 4.1 5.4  --   NEUTROABS 2.2  --  2.1 2.2  --   --   HGB 12.0   < > 11.2* 11.0* 11.1* 11.2*  HCT 39.9   < > 38.4 37.3 36.0 33.0*  MCV 73.6*  --  72.7* 72.1* 71.6*  --   PLT 177  --  172 144* 170  --    < > = values in this interval not displayed.   Lab Results  Component Value Date  TSH 3.934 05/21/2014   Lab Results  Component Value Date   HGBA1C 6.0 (H) 09/07/2018   Lab Results  Component Value Date   CHOL 157 09/07/2018   HDL 41 09/07/2018   LDLCALC 95 09/07/2018   TRIG 104 09/07/2018   CHOLHDL 3.8 09/07/2018    Significant Diagnostic Results in last 30 days:  Ct Abdomen Pelvis Wo  Contrast  Result Date: 09/13/2018 CLINICAL DATA:  Abdominal pain.  Lung carcinoma. EXAM: CT ABDOMEN AND PELVIS WITHOUT CONTRAST TECHNIQUE: Multidetector CT imaging of the abdomen and pelvis was performed following the standard protocol without IV contrast. Oral contrast was administered. COMPARISON:  PET-CT May 20, 2015 FINDINGS: Lower chest: There is bibasilar atelectatic change. No consolidation in the lung bases. There is a small hiatal hernia. Hepatobiliary: There is a 7 mm probable cyst in the posterior dome of the liver region. No other focal liver lesions are apparent on this noncontrast enhanced study. Gallbladder wall is not appreciably thickened. There is no biliary duct dilatation. Pancreas: No pancreatic mass or inflammatory focus. Spleen: No splenic lesions are evident. Adrenals/Urinary Tract: Adrenals bilaterally appear unremarkable. Kidneys bilaterally show no appreciable mass or hydronephrosis on either side. There is no demonstrable renal or ureteral calculus on either side. Urinary bladder is midline with wall thickness within normal limits. Stomach/Bowel: There are scattered sigmoid diverticula without diverticulitis. There is no appreciable bowel wall or mesenteric thickening. There is no evident bowel obstruction. There is no free air or portal venous air. There is lipomatous infiltration of the ileocecal valve. Vascular/Lymphatic: There is aortic tortuosity without aneurysm. No arterial vascular calcification is evident. There is no evident adenopathy in the abdomen or pelvis. Reproductive: The uterus is anteverted.  No pelvic masses evident. Other: The appendix appears unremarkable. There is no abscess or ascites in the abdomen or pelvis. Musculoskeletal: There is degenerative change in the lumbar spine. There are no blastic or lytic bone lesions. There is no intramuscular or chest wall lesion evident. IMPRESSION: 1. A cause for patient's symptoms has not been established with this  study. 2. Appendix appears normal. No evident bowel wall thickening or bowel obstruction. There are sigmoid diverticula without diverticulitis. 3. No evident renal or ureteral calculus. No hydronephrosis. Urinary bladder wall thickness is within normal limits. 4.  Small hiatal hernia. Electronically Signed   By: Lowella Grip III M.D.   On: 09/13/2018 07:10   Dg Chest 2 View  Result Date: 09/13/2018 CLINICAL DATA:  68 y/o F; shortness of breath tonight with left flank pain and weakness. EXAM: CHEST - 2 VIEW COMPARISON:  09/06/2018 chest radiograph FINDINGS: Stable cardiomegaly given projection and technique. Loop recorder device. Right mid lung zone platelike atelectasis. Low lung volumes. No consolidation, effusion, or pneumothorax. No acute osseous abnormality is evident. IMPRESSION: Right mid lung zone platelike atelectasis. Low lung volumes. Stable cardiomegaly. Electronically Signed   By: Kristine Garbe M.D.   On: 09/13/2018 04:32   Dg Chest 2 View  Result Date: 09/01/2018 CLINICAL DATA:  Pt presents with bilateral lower extremity swelling. R>L. Denies SOB or chest pain, pt sent from Dr office, hx COPD, CHF, lung ca, sarcoidosis EXAM: CHEST - 2 VIEW COMPARISON:  12/27/2017 FINDINGS: The heart size and mediastinal contours are within normal limits. Both lungs are clear. The visualized skeletal structures are unremarkable. IMPRESSION: No active cardiopulmonary disease. Electronically Signed   By: Nolon Nations M.D.   On: 09/01/2018 13:58   Ct Head Wo Contrast  Result Date: 09/06/2018 CLINICAL DATA:  Posttraumatic altered mental state status EXAM: CT HEAD WITHOUT CONTRAST CT CERVICAL SPINE WITHOUT CONTRAST TECHNIQUE: Multidetector CT imaging of the head and cervical spine was performed following the standard protocol without intravenous contrast. Multiplanar CT image reconstructions of the cervical spine were also generated. COMPARISON:  Brain MRI 03/01/2018 FINDINGS: CT HEAD FINDINGS  Brain: No evidence of acute infarction, hemorrhage, hydrocephalus, extra-axial collection or mass lesion/mass effect. Vascular: No hyperdense vessel or unexpected calcification. Skull: Large left frontal scalp hematoma. No underlying calvarial fracture. Sinuses/Orbits: Negative Other: Motion degraded such that repeat images were needed. CT CERVICAL SPINE FINDINGS Alignment: Normal Skull base and vertebrae: No acute fracture. No primary bone lesion or focal pathologic process. Soft tissues and spinal canal: No prevertebral fluid or swelling. No visible canal hematoma. Disc levels:  No significant degenerative change Upper chest: Negative. IMPRESSION: 1. No evidence of intracranial or cervical spine injury. 2. Scalp hematoma without calvarial fracture. Electronically Signed   By: Monte Fantasia M.D.   On: 09/06/2018 06:23   Ct Cervical Spine Wo Contrast  Result Date: 09/06/2018 CLINICAL DATA:  Posttraumatic altered mental state status EXAM: CT HEAD WITHOUT CONTRAST CT CERVICAL SPINE WITHOUT CONTRAST TECHNIQUE: Multidetector CT imaging of the head and cervical spine was performed following the standard protocol without intravenous contrast. Multiplanar CT image reconstructions of the cervical spine were also generated. COMPARISON:  Brain MRI 03/01/2018 FINDINGS: CT HEAD FINDINGS Brain: No evidence of acute infarction, hemorrhage, hydrocephalus, extra-axial collection or mass lesion/mass effect. Vascular: No hyperdense vessel or unexpected calcification. Skull: Large left frontal scalp hematoma. No underlying calvarial fracture. Sinuses/Orbits: Negative Other: Motion degraded such that repeat images were needed. CT CERVICAL SPINE FINDINGS Alignment: Normal Skull base and vertebrae: No acute fracture. No primary bone lesion or focal pathologic process. Soft tissues and spinal canal: No prevertebral fluid or swelling. No visible canal hematoma. Disc levels:  No significant degenerative change Upper chest: Negative.  IMPRESSION: 1. No evidence of intracranial or cervical spine injury. 2. Scalp hematoma without calvarial fracture. Electronically Signed   By: Monte Fantasia M.D.   On: 09/06/2018 06:23   Mr Jodene Nam Head Wo Contrast  Result Date: 09/06/2018 CLINICAL DATA:  Followup thalamic stroke. EXAM: MRA HEAD WITHOUT CONTRAST TECHNIQUE: Angiographic images of the Circle of Willis were obtained using MRA technique without intravenous contrast. COMPARISON:  MRI same day. FINDINGS: Both internal carotid arteries are widely patent into the brain. No siphon stenosis. The anterior and middle cerebral vessels are patent without proximal stenosis, aneurysm or vascular malformation. Both vertebral arteries are widely patent to the basilar. No basilar stenosis. Posterior circulation branch vessels appear normal. IMPRESSION: Normal examination. Specifically, no posterior circulation or basilar tip abnormality seen. Electronically Signed   By: Nelson Chimes M.D.   On: 09/06/2018 14:02   Mr Brain Wo Contrast  Result Date: 09/06/2018 CLINICAL DATA:  History of lung cancer and sarcoidosis. Altered mental status. Found on the floor. EXAM: MRI HEAD WITHOUT CONTRAST TECHNIQUE: Multiplanar, multiecho pulse sequences of the brain and surrounding structures were obtained without intravenous contrast. COMPARISON:  CT same day.  MRI 03/01/2018 FINDINGS: Brain: Diffusion imaging shows acute infarction in both thalami. Each region of infarction measures about a cm in size. The brainstem is otherwise normal. There are a few old small vessel cerebellar infarctions. Cerebral hemispheres otherwise show minimal small vessel change of the white matter. No cortical or large vessel territory infarction. No mass lesion, hemorrhage, hydrocephalus or extra-axial collection. Vascular: Major vessels at the base of the brain show  flow. This includes flow being apparent in the vertebrobasilar system. Skull and upper cervical spine: Negative Sinuses/Orbits:  Clear/normal Other: Left forehead and frontal scalp hematoma. IMPRESSION: Acute infarction within both the right and left thalami. 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 Left forehead and frontal scalp hematoma. Electronically Signed   By: Nelson Chimes M.D.   On: 09/06/2018 10:44   Dg Chest Portable 1 View  Result Date: 09/06/2018 CLINICAL DATA:  Altered mental status EXAM: PORTABLE CHEST 1 VIEW COMPARISON:  Five days ago FINDINGS: Low volume chest. There is no edema, consolidation, effusion, or pneumothorax. Linear atelectasis in the right mid lung. Borderline heart size accentuated by low volumes. IMPRESSION: Low volume chest without acute finding. Electronically Signed   By: Monte Fantasia M.D.   On: 09/06/2018 06:38   Dg Knee Right Port  Result Date: 09/06/2018 CLINICAL DATA:  Fall with right knee bruising EXAM: PORTABLE RIGHT KNEE - 1-2 VIEW COMPARISON:  None. FINDINGS: Anterior soft tissue swelling. Chronic fragmentation or articular bodies above the patella. There is tricompartmental degenerative spurring. No acute fracture or subluxation. IMPRESSION: Soft tissue swelling without acute fracture. Electronically Signed   By: Monte Fantasia M.D.   On: 09/06/2018 06:39   Vas US Carotid (at [employer] Only)  Result Date: 09/08/2018 Carotid Arterial Duplex Study Indications: CVA. Limitations: thick neck Performing Technologist: June Leap RDMS, RVT  Examination Guidelines: A complete evaluation includes B-mode imaging, spectral Doppler, color Doppler, and power Doppler as needed of all accessible portions of each vessel. Bilateral testing is considered an integral part of a complete examination. Limited examinations for reoccurring indications may be performed as noted.  Right Carotid Findings: +----------+--------+--------+--------+--------+------------------+             PSV cm/s EDV  cm/s Stenosis Describe Comments            +----------+--------+--------+--------+--------+------------------+  CCA Prox   54       14                                             +----------+--------+--------+--------+--------+------------------+  CCA Distal 45       4                                              +----------+--------+--------+--------+--------+------------------+  ICA Prox   27       10       1-39%             intimal thickening  +----------+--------+--------+--------+--------+------------------+  ICA Distal 92       25                                             +----------+--------+--------+--------+--------+------------------+  ECA        68       7                                              +----------+--------+--------+--------+--------+------------------+ +----------+--------+-------+----------------+-------------------+  PSV cm/s EDV cms Describe         Arm Pressure (mmHG)  +----------+--------+-------+----------------+-------------------+  Subclavian 128              Multiphasic, WNL                      +----------+--------+-------+----------------+-------------------+ +---------+--------+--+--------+--+---------+  Vertebral PSV cm/s 60 EDV cm/s 17 Antegrade  +---------+--------+--+--------+--+---------+  Left Carotid Findings: +----------+--------+--------+--------+--------+------------------+             PSV cm/s EDV cm/s Stenosis Describe Comments            +----------+--------+--------+--------+--------+------------------+  CCA Prox   55       14                                             +----------+--------+--------+--------+--------+------------------+  CCA Distal 59       11                                             +----------+--------+--------+--------+--------+------------------+  ICA Prox   55       16       1-39%             intimal thickening  +----------+--------+--------+--------+--------+------------------+  ICA Distal 137      38                                              +----------+--------+--------+--------+--------+------------------+  ECA        54       8                                              +----------+--------+--------+--------+--------+------------------+ +----------+--------+--------+----------------+-------------------+  Subclavian PSV cm/s EDV cm/s Describe         Arm Pressure (mmHG)  +----------+--------+--------+----------------+-------------------+             144               Multiphasic, WNL                      +----------+--------+--------+----------------+-------------------+ +---------+--------+--+--------+--+---------+  Vertebral PSV cm/s 56 EDV cm/s 19 Antegrade  +---------+--------+--+--------+--+---------+  Summary: Right Carotid: Velocities in the right ICA are consistent with a 1-39% stenosis. Left Carotid: Velocities in the left ICA are consistent with a 1-39% stenosis. Vertebrals: Bilateral vertebral arteries demonstrate antegrade flow. *See table(s) above for measurements and observations.  Electronically signed by Antony Contras MD on 09/08/2018 at 11:59:36 AM.    Final    Vas [place] Lower Extremity Venous (dvt)  Result Date: 09/06/2018  Lower Venous Study Indications: Edema.  Limitations: Body habitus and poor ultrasound/tissue interface. Performing Technologist: June Leap RDMS, RVT  Examination Guidelines: A complete evaluation includes B-mode imaging, spectral Doppler, color Doppler, and power Doppler as needed of all accessible portions of each vessel. Bilateral testing is considered an integral part of a complete examination. Limited examinations for reoccurring indications may be performed as noted.  Right Venous Findings: +---------+---------------+---------+-----------+----------+--------------+  Compressibility Phasicity Spontaneity Properties Summary         +---------+---------------+---------+-----------+----------+--------------+  CFV       Full            Yes       Yes                                     +---------+---------------+---------+-----------+----------+--------------+  SFJ       Full                                                             +---------+---------------+---------+-----------+----------+--------------+  FV Prox   Full                                                             +---------+---------------+---------+-----------+----------+--------------+  FV Mid    Full                                                             +---------+---------------+---------+-----------+----------+--------------+  FV Distal Full                                                             +---------+---------------+---------+-----------+----------+--------------+  PFV       Full                                                             +---------+---------------+---------+-----------+----------+--------------+  POP       Full            Yes       Yes                                    +---------+---------------+---------+-----------+----------+--------------+  PTV                                                        Not visualized  +---------+---------------+---------+-----------+----------+--------------+  PERO  Not visualized  +---------+---------------+---------+-----------+----------+--------------+  Left Venous Findings: +---------+---------------+---------+-----------+----------+--------------+            Compressibility Phasicity Spontaneity Properties Summary         +---------+---------------+---------+-----------+----------+--------------+  CFV       Full            Yes       Yes                                    +---------+---------------+---------+-----------+----------+--------------+  SFJ       Full                                                             +---------+---------------+---------+-----------+----------+--------------+  FV Prox   Full                                                              +---------+---------------+---------+-----------+----------+--------------+  FV Mid    Full                                                             +---------+---------------+---------+-----------+----------+--------------+  FV Distal Full                                                             +---------+---------------+---------+-----------+----------+--------------+  PFV       Full                                                             +---------+---------------+---------+-----------+----------+--------------+  POP       Full            Yes       Yes                                    +---------+---------------+---------+-----------+----------+--------------+  PTV                                                        Not visualized  +---------+---------------+---------+-----------+----------+--------------+  PERO  Not visualized  +---------+---------------+---------+-----------+----------+--------------+    Summary: Right: There is no evidence of deep vein thrombosis in the lower extremity. However, portions of this examination were limited- see technologist comments above. No cystic structure found in the popliteal fossa. Left: There is no evidence of deep vein thrombosis in the lower extremity. However, portions of this examination were limited- see technologist comments above. No cystic structure found in the popliteal fossa.  *See table(s) above for measurements and observations. Electronically signed by Deitra Mayo MD on 09/06/2018 at 8:03:08 PM.    Final     Assessment/Plan  #1- history of weight gain-she does appear to have some gradually increased edema with a history of diastolic CHF.  Her BNP in the hospital actually was unremarkable- she not complaining of any increased shortness of breath is doing well with therapy but the edema and weight do appear to be increased.  This was discussed with Dr. Linna Darner via phone and will  add Zaroxolyn 2.5 QD and monitor again clinically she appears to be stable without any increased shortness of breath or labored breathing but this will have to be watched  2.  Right knee pain she is supposed to have an orthopedic consult pending but unclear whether this has still been arranged-I did discuss this with Dr. Linna Darner he will see patient tomorrow as well.  She says the pain is about baseline with what it was a couple weeks ago.    3.  right leg erythema- per nursing this is fairly chronic s patient does not report any increased erythema on the right leg she says it has been that way since she had a fall-- but this will have to be watched as well for any increase-  #4 history of CVA-she is followed by neurology and will have follow-up she is on aspirin 325 mg a day as well as a statin.  5.  History of COPD she continues on Symbicort as well as as needed albuterol this appears relatively baseline-her slight wheezing appears to be unchanged from previous exams.  6.  History of chronic kidney disease with creatinine appears relatively baseline at 1.64 on lab done 2 days ago.  7.  History of anemia hemoglobin was 11.1 on lab done on February 25--this appears relatively baseline with previous labs previous 2 months   #8- history of gout she is on Uloric this is been stable during her stay here.  9.  History of lung carcinoma status post radiation back in 2013-she has been followed by pulmonology-Dr. Melvyn Novas  10 history of abdominal pain actually she presented to the ER recently with this but work-up appeared to be negative and this appears to be much better.  Of note in regards to the increased edema especially on the right will reorder a venous Doppler- Homans sign was somewhat equivocal-would like to rule out any acute DVT before she goes home certainly  Also will update a CBC with differential tomorrow to keep an eye on her hemoglobin- especially with a history of what might be a right  knee effusion  Clinically however she appears to be doing well and says she is doing well with therapy  [id number]--of note greater than 40 minutes spent assessing patient- discussing her status with nursing-reviewing her chart and labs-and discussing her status with Dr. Linna Darner as well- and coordinating formulating a plan of care-of note greater than 50% of time spent coordinating a plan of care with input as noted above

## 2018-09-29 ENCOUNTER — Encounter: Payer: Self-pay | Admitting: Internal Medicine

## 2018-09-29 ENCOUNTER — Non-Acute Institutional Stay (SKILLED_NURSING_FACILITY): Payer: Medicare Other | Admitting: Internal Medicine

## 2018-09-29 DIAGNOSIS — K5900 Constipation, unspecified: Secondary | ICD-10-CM

## 2018-09-29 DIAGNOSIS — M25561 Pain in right knee: Secondary | ICD-10-CM | POA: Diagnosis not present

## 2018-09-29 DIAGNOSIS — N183 Chronic kidney disease, stage 3 (moderate): Secondary | ICD-10-CM | POA: Diagnosis not present

## 2018-09-29 DIAGNOSIS — J441 Chronic obstructive pulmonary disease with (acute) exacerbation: Secondary | ICD-10-CM | POA: Diagnosis not present

## 2018-09-29 DIAGNOSIS — I639 Cerebral infarction, unspecified: Secondary | ICD-10-CM | POA: Diagnosis not present

## 2018-09-29 DIAGNOSIS — N179 Acute kidney failure, unspecified: Secondary | ICD-10-CM

## 2018-09-29 DIAGNOSIS — I5032 Chronic diastolic (congestive) heart failure: Secondary | ICD-10-CM

## 2018-09-29 DIAGNOSIS — D869 Sarcoidosis, unspecified: Secondary | ICD-10-CM | POA: Diagnosis not present

## 2018-09-29 DIAGNOSIS — M109 Gout, unspecified: Secondary | ICD-10-CM | POA: Diagnosis not present

## 2018-09-29 DIAGNOSIS — J45901 Unspecified asthma with (acute) exacerbation: Secondary | ICD-10-CM | POA: Diagnosis not present

## 2018-09-29 NOTE — Progress Notes (Signed)
Location:    [facility] Room Number: 210/A Place of Service:  SNF (31)  Provider:Nicholad Kautzman Amada Jupiter  PCP: Lanae Boast, FNP Patient Care Team: Lanae Boast, FNP as PCP - General (Family Medicine)  Extended Emergency Contact Information Primary Emergency Contact: Davids,Orlandis Address: [place], [place] [address] [address] of [place] Phone: [phone] Work Phone: [phone] Mobile Phone: [phone] Relation: Son Secondary Emergency Contact: Ramsay,Candace  [place] Phone: [phone] Relation: Daughter  Code Status: Full Code Goals of care:  Advanced Directive information Advanced Directives 09/29/2018  Does Patient Have a Medical Advance Directive? Yes  Type of Advance Directive (No Data)  Does patient want to make changes to medical advance directive? No - Patient declined  Would patient like information on creating a medical advance directive? No - Patient declined     Allergies  Allergen Reactions   Sulfur Rash    Chief Complaint  Patient presents with   Discharge Note    Discharge Visit    HPI:  68 y.o. female seen today for discharge from facility. 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  At time of her CVA apparently she fell and bruised her forehead chest and knees- she was discharged but she came back to the ER complaining of increased weakness and abdominal pain.  She also had increased edema.  ER work-up did not show any evidence of broken bones or new neurologic issues CT of the abdomen did not show any acute abnormalities-.  She was started on Lasix 40 mg a day.  Secondary to her continued weakness it was decided she best go to skilled nursing for short period of time.  She did have a loop recorder placed to rule out atrial fibrillation- this was followed apparently by  cardiology.  Her aspirin also was increased to 325 mg a day secondary to the CVA.  During her hospitalization she also had a low magnesium that was supplemented and did normalize prior to discharge at 1.8.  She does have a history of sickle cell trait hemoglobin has been relatively stable here staying around the 10 range it was 10.3 on today's lab.  She also has a history of lung cancer status post radiation in 2013- she also has a history of gout and continues on Uloric.  She does have albuterol nebulizers as needed for a history of COPD.  She was seen yesterday for some weight gain about 6 7 pounds since her admission in late February Dr. Linna Darner did increase her Lasix to 40 mg twice daily when he saw her on her initial admission visit- however she continued to gradually gained a small amount of weight-and we did start her on metolazone   2 days a week she got her first dose this morning.  She did have a cardiac echo done in February which showed normal left ventricular systolic function.   Secondary to her edema especially on the right leg and continued knee pain we did order a venous Doppler which is come back negative for DVT previous Dopplers came back negative as well.  .  It is thought she may have a knee effusion or a popliteal cyst which is somewhat uncomfortable- will attempt to arrange orthopedic follow-up when she is discharged.        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 Patient notes it was consistent with sarcoidosis but actual pathology results not available.   Oxygen deficiency    Sarcoidosis    Sickle cell trait ([facility])    Sleep apnea     Past Surgical History:  Procedure Laterality Date   LOOP RECORDER INSERTION N/A 09/08/2018   Procedure: LOOP RECORDER INSERTION;  Surgeon: Evans Lance, MD;  Location: [facility];  Service: Cardiovascular;  Laterality: N/A;   LUNG BIOPSY     TEE WITHOUT CARDIOVERSION N/A 09/08/2018   Procedure: TRANSESOPHAGEAL ECHOCARDIOGRAM (TEE);  Surgeon: Josue Hector, MD;  Location: [facility] END[facility]COPY;  Service: Cardiovascular;  Laterality: N/A;  with loop   TUBAL LIGATION     VEIN LIGATION AND STRIPPING        reports that she has never smoked. She has never used smokeless tobacco. She reports that she does not drink alcohol or use drugs. Social History   Socioeconomic History   Marital status: Divorced    Spouse name: Not on file   Number of children: 2   Years of education: Not on file   Highest education level: Not on file  Occupational History   Not on file  Social Needs   Financial resource strain: Not on file   Food insecurity:    Worry: Not on file    Inability: Not on file   Transportation needs:    Medical: Not on file    Non-medical: Not on file  Tobacco Use   Smoking status: Never Smoker   Smokeless tobacco: Never Used  Substance and Sexual Activity   Alcohol use: No    Alcohol/week: 0.0 standard drinks   Drug use: No   Sexual activity: Not on file  Lifestyle   Physical activity:    Days per week: Not on file    Minutes per session: Not on file   Stress: Not on file  Relationships   Social connections:    Talks on phone: Not on file    Gets together: Not on file    Attends religious service: Not on file    Active member of club or organization: Not on file    Attends meetings of clubs or organizations: Not on file     Relationship status: Not on file   Intimate partner violence:    Fear of current or ex partner: Not on file    Emotionally abused: Not on file    Physically abused: Not on file    Forced sexual activity: Not on file  Other Topics Concern   Not on file  Social History Narrative   Lives with son.  One son is deceased.     Functional Status Survey:    Allergies  Allergen Reactions   Sulfur Rash    Pertinent  Health Maintenance Due  Topic Date Due   PAP SMEAR-Modifier  10/13/2018 (Originally 01/01/2018)  DEXA SCAN  10/13/2018 (Originally 11/06/2017)   PNA vac Low Risk Adult (2 of 2 - PPSV23) 04/14/2019   MAMMOGRAM  05/05/2020   COLONOSCOPY  12/17/2026    Medications: Allergies as of 09/29/2018      Reactions   Sulfur Rash      Medication List       Accurate as of September 29, 2018 12:23 PM. Always use your most recent med list.        acetaminophen 325 MG tablet Commonly known as:  TYLENOL Take 650 mg by mouth every 6 (six) hours as needed for mild pain.   albuterol (2.5 MG/3ML) 0.083% nebulizer solution Commonly known as:  PROVENTIL Take 3 mLs (2.5 mg total) by nebulization every 6 (six) hours as needed for wheezing or shortness of breath.   albuterol 108 (90 Base) MCG/ACT inhaler Commonly known as:  ProAir HFA Inhale 2 puffs into the lungs every 6 (six) hours as needed for wheezing or shortness of breath.   aspirin 325 MG tablet Take 1 tablet (325 mg total) by mouth daily.   atorvastatin 40 MG tablet Commonly known as:  LIPITOR Take 1 tablet (40 mg total) by mouth daily at 6 PM.   budesonide-formoterol 160-4.5 MCG/ACT inhaler Commonly known as:  Symbicort Inhale 2 puffs into the lungs 2 (two) times daily.   calcitRIOL 0.25 MCG capsule Commonly known as:  ROCALTROL Take 1 capsule (0.25 mcg total) by mouth daily.   docusate sodium 100 MG capsule Commonly known as:  COLACE Take 1 capsule (100 mg total) by mouth 2 (two) times daily.   febuxostat 40  MG tablet Commonly known as:  Uloric Take 1 tablet (40 mg total) by mouth daily.   furosemide 40 MG tablet Commonly known as:  LASIX Take 40 mg by mouth 2 (two) times daily. For CHF   metolazone 2.5 MG tablet Commonly known as:  ZAROXOLYN Take 2.5 mg by mouth daily.   montelukast 10 MG tablet Commonly known as:  SINGULAIR Take 1 tablet (10 mg total) by mouth at bedtime.   potassium chloride SA 20 MEQ tablet Commonly known as:  K-DUR,KLOR-CON Take 1 tablet (20 mEq total) by mouth daily.       Review of Systems   In general she is not complain of any fever chills again she has gained some weight and has been started on metolazone in addition to her twice daily Lasix.  Skin does not complain of rashes or itching.  Head ears eyes nose mouth and throat is not complain of visual changes or sore throat.  Respiratory does not complain of increased shortness has some very mild wheezing which is chronic with her history of COPD.  Cardiac does not complain of chest pain continues with fairly extensive lower extremity edema.  GI is not complaining of abdominal discomfort nausea vomiting diarrhea constipation.  GU does not complain of dysuria.  Musculoskeletal does complain of continued right knee discomfort as well as generalized weakness lower extremities.  Neurologic is positive for weakness is not complaining of dizziness or syncope or headache.  Psych does not complain of any depression or anxiety.    Vitals:   09/29/18 1207  BP: (!) 146/76  Pulse: 88  Resp: 20  Temp: 98.7 F (37.1 C)  TempSrc: Oral    Physical Exam  In general this is a pleasant female in no distress sitting comfortably in her wheelchair.  Her skin is warm and dry does still have some bruising small amount  of scaling of her right knee.  Eyes visual acuity appears to be intact sclera and conjunctive are clear.  Oropharynx is clear mucous membranes moist.  Chest- has a very minimal amount of  wheezing on exam today if anything this is improved from previous exams-there is no labored breathing.  Heart is distant heart sounds from what I could ascertain feeling radial pulse regular rate and rhythm she has extensive lower extremity edema more so on her right leg versus the left.  Abdomen is obese soft nontender with slightly hypoactive bowel sounds most likely due to obesity  Musculoskeletal does continue with lower extremity weakness does move all extremities x4 but quite limited lower extremities with extensive edema- continues to have some tenderness to the back of her right knee.  Neurologic is grossly intact her speech is clear cannot appreciate lateralizing findings.  Psych she is alert and oriented pleasant and appropriate   Labs reviewed:  Nov 29, 2018.  WBC 4.8 hemoglobin 10.3 platelets 189.  Sodium 142 potassium 4.1 BUN 31 creatinine 1.6.  Uric acid 5.8 Basic Metabolic Panel: Recent Labs    09/06/18 0556 09/06/18 0825 09/07/18 1140 09/13/18 0003 09/13/18 0843 09/20/18  NA 142  --  140 138 136 143  K 3.3*  --  4.3 3.4* 3.6 4.1  CL 103  --  107 99  --  101  CO2 27  --  25 27  --  27  GLUCOSE 112*  --  99 125*  --   --   BUN 25*  --  22 25*  --  30*  CREATININE 1.97*  --  1.60* 1.64*  --  1.4*  CALCIUM 8.4*  --  8.4* 8.6*  --  8.2  MG  --  1.6* 1.8  --   --   --   PHOS  --  4.1 3.4  --   --   --    Liver Function Tests: Recent Labs    09/01/18 1250 09/06/18 0556 09/07/18 1140 09/13/18 0003  AST 27 22  --  20  ALT 16 13  --  16  ALKPHOS 111 99  --  122  BILITOT 0.7 0.4  --  0.6  PROT 7.1 6.3*  --  6.5  ALBUMIN 3.5 3.1* 3.1* 3.2*   Recent Labs    09/13/18 0003  LIPASE 35   Recent Labs    09/06/18 0558  AMMONIA 21   CBC: Recent Labs    09/01/18 1250  09/06/18 0556 09/07/18 1140 09/13/18 0003 09/13/18 0843  WBC 4.6  --  4.5 4.1 5.4  --   NEUTROABS 2.2  --  2.1 2.2  --   --   HGB 12.0   < > 11.2* 11.0* 11.1* 11.2*  HCT 39.9   < >  38.4 37.3 36.0 33.0*  MCV 73.6*  --  72.7* 72.1* 71.6*  --   PLT 177  --  172 144* 170  --    < > = values in this interval not displayed.   Cardiac Enzymes: Recent Labs    09/01/18 1250 09/13/18 0407  TROPONINI <0.03 <0.03   BNP: Invalid input(s): POCBNP CBG: Recent Labs    09/06/18 0537  GLUCAP 102*    Procedures and Imaging Studies During Stay: Ct Abdomen Pelvis Wo Contrast  Result Date: 09/13/2018 CLINICAL DATA:  Abdominal pain.  Lung carcinoma. EXAM: CT ABDOMEN AND PELVIS WITHOUT CONTRAST TECHNIQUE: Multidetector CT imaging of the abdomen and pelvis was performed following the  standard protocol without IV contrast. Oral contrast was administered. COMPARISON:  PET-CT May 20, 2015 FINDINGS: Lower chest: There is bibasilar atelectatic change. No consolidation in the lung bases. There is a small hiatal hernia. Hepatobiliary: There is a 7 mm probable cyst in the posterior dome of the liver region. No other focal liver lesions are apparent on this noncontrast enhanced study. Gallbladder wall is not appreciably thickened. There is no biliary duct dilatation. Pancreas: No pancreatic mass or inflammatory focus. Spleen: No splenic lesions are evident. Adrenals/Urinary Tract: Adrenals bilaterally appear unremarkable. Kidneys bilaterally show no appreciable mass or hydronephrosis on either side. There is no demonstrable renal or ureteral calculus on either side. Urinary bladder is midline with wall thickness within normal limits. Stomach/Bowel: There are scattered sigmoid diverticula without diverticulitis. There is no appreciable bowel wall or mesenteric thickening. There is no evident bowel obstruction. There is no free air or portal venous air. There is lipomatous infiltration of the ileocecal valve. Vascular/Lymphatic: There is aortic tortuosity without aneurysm. No arterial vascular calcification is evident. There is no evident adenopathy in the abdomen or pelvis. Reproductive: The uterus  is anteverted.  No pelvic masses evident. Other: The appendix appears unremarkable. There is no abscess or ascites in the abdomen or pelvis. Musculoskeletal: There is degenerative change in the lumbar spine. There are no blastic or lytic bone lesions. There is no intramuscular or chest wall lesion evident. IMPRESSION: 1. A cause for patient's symptoms has not been established with this study. 2. Appendix appears normal. No evident bowel wall thickening or bowel obstruction. There are sigmoid diverticula without diverticulitis. 3. No evident renal or ureteral calculus. No hydronephrosis. Urinary bladder wall thickness is within normal limits. 4.  Small hiatal hernia. Electronically Signed   By: Lowella Grip III M.D.   On: 09/13/2018 07:10   Dg Chest 2 View  Result Date: 09/13/2018 CLINICAL DATA:  68 y/o F; shortness of breath tonight with left flank pain and weakness. EXAM: CHEST - 2 VIEW COMPARISON:  09/06/2018 chest radiograph FINDINGS: Stable cardiomegaly given projection and technique. Loop recorder device. Right mid lung zone platelike atelectasis. Low lung volumes. No consolidation, effusion, or pneumothorax. No acute osseous abnormality is evident. IMPRESSION: Right mid lung zone platelike atelectasis. Low lung volumes. Stable cardiomegaly. Electronically Signed   By: Kristine Garbe M.D.   On: 09/13/2018 04:32   Dg Chest 2 View  Result Date: 09/01/2018 CLINICAL DATA:  Pt presents with bilateral lower extremity swelling. R>L. Denies SOB or chest pain, pt sent from Dr office, hx COPD, CHF, lung ca, sarcoidosis EXAM: CHEST - 2 VIEW COMPARISON:  12/27/2017 FINDINGS: The heart size and mediastinal contours are within normal limits. Both lungs are clear. The visualized skeletal structures are unremarkable. IMPRESSION: No active cardiopulmonary disease. Electronically Signed   By: Nolon Nations M.D.   On: 09/01/2018 13:58   Ct Head Wo Contrast  Result Date: 09/06/2018 CLINICAL DATA:   Posttraumatic altered mental state status EXAM: CT HEAD WITHOUT CONTRAST CT CERVICAL SPINE WITHOUT CONTRAST TECHNIQUE: Multidetector CT imaging of the head and cervical spine was performed following the standard protocol without intravenous contrast. Multiplanar CT image reconstructions of the cervical spine were also generated. COMPARISON:  Brain MRI 03/01/2018 FINDINGS: CT HEAD FINDINGS Brain: No evidence of acute infarction, hemorrhage, hydrocephalus, extra-axial collection or mass lesion/mass effect. Vascular: No hyperdense vessel or unexpected calcification. Skull: Large left frontal scalp hematoma. No underlying calvarial fracture. Sinuses/Orbits: Negative Other: Motion degraded such that repeat images were needed. CT CERVICAL  SPINE FINDINGS Alignment: Normal Skull base and vertebrae: No acute fracture. No primary bone lesion or focal pathologic process. Soft tissues and spinal canal: No prevertebral fluid or swelling. No visible canal hematoma. Disc levels:  No significant degenerative change Upper chest: Negative. IMPRESSION: 1. No evidence of intracranial or cervical spine injury. 2. Scalp hematoma without calvarial fracture. Electronically Signed   By: Monte Fantasia M.D.   On: 09/06/2018 06:23   Ct Cervical Spine Wo Contrast  Result Date: 09/06/2018 CLINICAL DATA:  Posttraumatic altered mental state status EXAM: CT HEAD WITHOUT CONTRAST CT CERVICAL SPINE WITHOUT CONTRAST TECHNIQUE: Multidetector CT imaging of the head and cervical spine was performed following the standard protocol without intravenous contrast. Multiplanar CT image reconstructions of the cervical spine were also generated. COMPARISON:  Brain MRI 03/01/2018 FINDINGS: CT HEAD FINDINGS Brain: No evidence of acute infarction, hemorrhage, hydrocephalus, extra-axial collection or mass lesion/mass effect. Vascular: No hyperdense vessel or unexpected calcification. Skull: Large left frontal scalp hematoma. No underlying calvarial fracture.  Sinuses/Orbits: Negative Other: Motion degraded such that repeat images were needed. CT CERVICAL SPINE FINDINGS Alignment: Normal Skull base and vertebrae: No acute fracture. No primary bone lesion or focal pathologic process. Soft tissues and spinal canal: No prevertebral fluid or swelling. No visible canal hematoma. Disc levels:  No significant degenerative change Upper chest: Negative. IMPRESSION: 1. No evidence of intracranial or cervical spine injury. 2. Scalp hematoma without calvarial fracture. Electronically Signed   By: Monte Fantasia M.D.   On: 09/06/2018 06:23   Mr Jodene Nam Head Wo Contrast  Result Date: 09/06/2018 CLINICAL DATA:  Followup thalamic stroke. EXAM: MRA HEAD WITHOUT CONTRAST TECHNIQUE: Angiographic images of the Circle of Willis were obtained using MRA technique without intravenous contrast. COMPARISON:  MRI same day. FINDINGS: Both internal carotid arteries are widely patent into the brain. No siphon stenosis. The anterior and middle cerebral vessels are patent without proximal stenosis, aneurysm or vascular malformation. Both vertebral arteries are widely patent to the basilar. No basilar stenosis. Posterior circulation branch vessels appear normal. IMPRESSION: Normal examination. Specifically, no posterior circulation or basilar tip abnormality seen. Electronically Signed   By: Nelson Chimes M.D.   On: 09/06/2018 14:02   Mr Brain Wo Contrast  Result Date: 09/06/2018 CLINICAL DATA:  History of lung cancer and sarcoidosis. Altered mental status. Found on the floor. EXAM: MRI HEAD WITHOUT CONTRAST TECHNIQUE: Multiplanar, multiecho pulse sequences of the brain and surrounding structures were obtained without intravenous contrast. COMPARISON:  CT same day.  MRI 03/01/2018 FINDINGS: Brain: Diffusion imaging shows acute infarction in both thalami. Each region of infarction measures about a cm in size. The brainstem is otherwise normal. There are a few old small vessel cerebellar infarctions.  Cerebral hemispheres otherwise show minimal small vessel change of the white matter. No cortical or large vessel territory infarction. No mass lesion, hemorrhage, hydrocephalus or extra-axial collection. Vascular: Major vessels at the base of the brain show flow. This includes flow being apparent in the vertebrobasilar system. Skull and upper cervical spine: Negative Sinuses/Orbits: Clear/normal Other: Left forehead and frontal scalp hematoma. IMPRESSION: Acute infarction within both the right and left thalami. 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 Left forehead and frontal scalp hematoma. Electronically Signed   By: Nelson Chimes M.D.   On: 09/06/2018 10:44   Dg Chest Portable 1 View  Result Date: 09/06/2018 CLINICAL DATA:  Altered mental status EXAM: PORTABLE CHEST  1 VIEW COMPARISON:  Five days ago FINDINGS: Low volume chest. There is no edema, consolidation, effusion, or pneumothorax. Linear atelectasis in the right mid lung. Borderline heart size accentuated by low volumes. IMPRESSION: Low volume chest without acute finding. Electronically Signed   By: Monte Fantasia M.D.   On: 09/06/2018 06:38   Dg Knee Right Port  Result Date: 09/06/2018 CLINICAL DATA:  Fall with right knee bruising EXAM: PORTABLE RIGHT KNEE - 1-2 VIEW COMPARISON:  None. FINDINGS: Anterior soft tissue swelling. Chronic fragmentation or articular bodies above the patella. There is tricompartmental degenerative spurring. No acute fracture or subluxation. IMPRESSION: Soft tissue swelling without acute fracture. Electronically Signed   By: Monte Fantasia M.D.   On: 09/06/2018 06:39   Vas US Carotid (at [facility] Only)  Result Date: 09/08/2018 Carotid Arterial Duplex Study Indications: CVA. Limitations: thick neck Performing Technologist: June Leap RDMS, RVT  Examination Guidelines: A complete evaluation includes B-mode imaging,  spectral Doppler, color Doppler, and power Doppler as needed of all accessible portions of each vessel. Bilateral testing is considered an integral part of a complete examination. Limited examinations for reoccurring indications may be performed as noted.  Right Carotid Findings: +----------+--------+--------+--------+--------+------------------+             PSV cm/s EDV cm/s Stenosis Describe Comments            +----------+--------+--------+--------+--------+------------------+  CCA Prox   54       14                                             +----------+--------+--------+--------+--------+------------------+  CCA Distal 45       4                                              +----------+--------+--------+--------+--------+------------------+  ICA Prox   27       10       1-39%             intimal thickening  +----------+--------+--------+--------+--------+------------------+  ICA Distal 92       25                                             +----------+--------+--------+--------+--------+------------------+  ECA        68       7                                              +----------+--------+--------+--------+--------+------------------+ +----------+--------+-------+----------------+-------------------+             PSV cm/s EDV cms Describe         Arm Pressure (mmHG)  +----------+--------+-------+----------------+-------------------+  Subclavian 128              Multiphasic, WNL                      +----------+--------+-------+----------------+-------------------+ +---------+--------+--+--------+--+---------+  Vertebral PSV cm/s 60 EDV cm/s 17 Antegrade  +---------+--------+--+--------+--+---------+  Left Carotid Findings: +----------+--------+--------+--------+--------+------------------+  PSV cm/s EDV cm/s Stenosis Describe Comments            +----------+--------+--------+--------+--------+------------------+  CCA Prox   55       14                                              +----------+--------+--------+--------+--------+------------------+  CCA Distal 59       11                                             +----------+--------+--------+--------+--------+------------------+  ICA Prox   55       16       1-39%             intimal thickening  +----------+--------+--------+--------+--------+------------------+  ICA Distal 137      38                                             +----------+--------+--------+--------+--------+------------------+  ECA        54       8                                              +----------+--------+--------+--------+--------+------------------+ +----------+--------+--------+----------------+-------------------+  Subclavian PSV cm/s EDV cm/s Describe         Arm Pressure (mmHG)  +----------+--------+--------+----------------+-------------------+             144               Multiphasic, WNL                      +----------+--------+--------+----------------+-------------------+ +---------+--------+--+--------+--+---------+  Vertebral PSV cm/s 56 EDV cm/s 19 Antegrade  +---------+--------+--+--------+--+---------+  Summary: Right Carotid: Velocities in the right ICA are consistent with a 1-39% stenosis. Left Carotid: Velocities in the left ICA are consistent with a 1-39% stenosis. Vertebrals: Bilateral vertebral arteries demonstrate antegrade flow. *See table(s) above for measurements and observations.  Electronically signed by Antony Contras MD on 09/08/2018 at 11:59:36 AM.    Final    Vas [place] Lower Extremity Venous (dvt)  Result Date: 09/06/2018  Lower Venous Study Indications: Edema.  Limitations: Body habitus and poor ultrasound/tissue interface. Performing Technologist: June Leap RDMS, RVT  Examination Guidelines: A complete evaluation includes B-mode imaging, spectral Doppler, color Doppler, and power Doppler as needed of all accessible portions of each vessel. Bilateral testing is considered an integral part of a complete examination. Limited  examinations for reoccurring indications may be performed as noted.  Right Venous Findings: +---------+---------------+---------+-----------+----------+--------------+            Compressibility Phasicity Spontaneity Properties Summary         +---------+---------------+---------+-----------+----------+--------------+  CFV       Full            Yes       Yes                                    +---------+---------------+---------+-----------+----------+--------------+  SFJ       Full                                                             +---------+---------------+---------+-----------+----------+--------------+  FV Prox   Full                                                             +---------+---------------+---------+-----------+----------+--------------+  FV Mid    Full                                                             +---------+---------------+---------+-----------+----------+--------------+  FV Distal Full                                                             +---------+---------------+---------+-----------+----------+--------------+  PFV       Full                                                             +---------+---------------+---------+-----------+----------+--------------+  POP       Full            Yes       Yes                                    +---------+---------------+---------+-----------+----------+--------------+  PTV                                                        Not visualized  +---------+---------------+---------+-----------+----------+--------------+  PERO                                                       Not visualized  +---------+---------------+---------+-----------+----------+--------------+  Left Venous Findings: +---------+---------------+---------+-----------+----------+--------------+            Compressibility Phasicity Spontaneity Properties Summary         +---------+---------------+---------+-----------+----------+--------------+  CFV        Full            Yes       Yes                                    +---------+---------------+---------+-----------+----------+--------------+  SFJ       Full                                                             +---------+---------------+---------+-----------+----------+--------------+  FV Prox   Full                                                             +---------+---------------+---------+-----------+----------+--------------+  FV Mid    Full                                                             +---------+---------------+---------+-----------+----------+--------------+  FV Distal Full                                                             +---------+---------------+---------+-----------+----------+--------------+  PFV       Full                                                             +---------+---------------+---------+-----------+----------+--------------+  POP       Full            Yes       Yes                                    +---------+---------------+---------+-----------+----------+--------------+  PTV                                                        Not visualized  +---------+---------------+---------+-----------+----------+--------------+  PERO                                                       Not visualized  +---------+---------------+---------+-----------+----------+--------------+    Summary: Right: There is no evidence of deep vein thrombosis in the lower extremity. However, portions of this examination were limited- see technologist comments above. No cystic structure found in the popliteal fossa. Left: There is no evidence of deep vein thrombosis in the lower extremity. However, portions of this examination were limited- see technologist comments above. No cystic structure found in the popliteal fossa.  *See table(s) above for measurements and observations.  Electronically signed by Deitra Mayo MD on 09/06/2018 at 8:03:08 PM.    Final      Assessment/Plan:    #1- history of weight gain- again she has been start metolazone 2.5 mg on Thursdays and Mondays in addition to Lasix 40 mg twice daily she is also on potassium supplementation-she will need updated labs drawn by home health next week and primary care provider notified of results she is not complaining of any increased shortness of breath from baseline.  2.  History of chronic right knee discomfort there is concern for possible popliteal cyst or effusion she will need orthopedic follow-up --I have called [facility] and they will see patient-- apparently they will call patient with appointment  3.  History of CVA she is now on aspirin 325 mg a day as well as a statin she will need neurology follow-up.  And this has been arranged She also has a loop recorder and patient states follow-up of this is arranged as well  She appears to have done fairly well with therapy.  4.  History of COPD she does have an order for PRN albuterol appears to be stable at times will have a small amount of wheezing but she says this is chronic.  She continues on Symbicort as well  5.-  History of diastolic CHF-again she is on Lasix and metolazone with potassium supplementation this will need follow-up by her primary care provider which has been arranged.  6.  History of gout this has been long-term per patient she is on Uloric.  7.  History of allergic rhinitis she continues on singular.  8.-  History of sickle cell trait this will need follow-up as an outpatient  9.  History of lung cancer she did have successful radiation back in 2013 and is followed by pulmonology Dr. Melvyn Novas   #10 history of renal insufficiency creatinine was 1.6 on today's lab this is been relatively baseline with recent values with this will need follow-up since she is now on extensive diuretics.  11.  History of prediabetes hemoglobin A1c was 6 in the hospital  #12-history of constipation she continues on  Colace.  13.  Suspected history of sarcoidosis she does continue on nebulizers  Again she will need home health support as well as PT and OT when she goes home as well as neurology follow-up and we will try to arrange orthopedic follow-up as well.  She will need updated labs next week to ensure stability of her hemoglobin and renal function.  [id number] note greater than 30 minutes spent on this discharge summary- greater than 50% of time spent coordinating a plan of care for numerous diagnoses

## 2018-09-30 MED ORDER — FEBUXOSTAT 40 MG PO TABS: 40.0000 mg | ORAL_TABLET | Freq: Every day | ORAL | 0 refills | Status: DC

## 2018-09-30 MED ORDER — DOCUSATE SODIUM 100 MG PO CAPS: 100.0000 mg | ORAL_CAPSULE | Freq: Two times a day (BID) | ORAL | 0 refills | Status: DC

## 2018-09-30 MED ORDER — ASPIRIN 325 MG PO TABS: 325.0000 mg | ORAL_TABLET | Freq: Every day | ORAL | 0 refills | Status: DC

## 2018-09-30 MED ORDER — FUROSEMIDE 40 MG PO TABS: 40.0000 mg | ORAL_TABLET | Freq: Two times a day (BID) | ORAL | 0 refills | Status: DC

## 2018-09-30 MED ORDER — ALBUTEROL SULFATE HFA 108 (90 BASE) MCG/ACT IN AERS: 2.0000 | INHALATION_SPRAY | Freq: Four times a day (QID) | RESPIRATORY_TRACT | 0 refills | Status: DC | PRN

## 2018-09-30 MED ORDER — METOLAZONE 2.5 MG PO TABS: 2.5000 mg | ORAL_TABLET | ORAL | 0 refills | Status: DC

## 2018-09-30 MED ORDER — ATORVASTATIN CALCIUM 40 MG PO TABS: 40.0000 mg | ORAL_TABLET | Freq: Every day | ORAL | 0 refills | Status: DC

## 2018-09-30 MED ORDER — MONTELUKAST SODIUM 10 MG PO TABS: 10.0000 mg | ORAL_TABLET | Freq: Every day | ORAL | 0 refills | Status: DC

## 2018-09-30 MED ORDER — BUDESONIDE-FORMOTEROL FUMARATE 160-4.5 MCG/ACT IN AERO: 2.0000 | INHALATION_SPRAY | Freq: Two times a day (BID) | RESPIRATORY_TRACT | 0 refills | Status: DC

## 2018-09-30 MED ORDER — ALBUTEROL SULFATE (2.5 MG/3ML) 0.083% IN NEBU: 2.5000 mg | INHALATION_SOLUTION | Freq: Four times a day (QID) | RESPIRATORY_TRACT | 0 refills | Status: DC | PRN

## 2018-09-30 MED ORDER — POTASSIUM CHLORIDE CRYS ER 20 MEQ PO TBCR: 20.0000 meq | EXTENDED_RELEASE_TABLET | Freq: Every day | ORAL | 0 refills | Status: DC

## 2018-09-30 MED ORDER — CALCITRIOL 0.25 MCG PO CAPS: 0.2500 ug | ORAL_CAPSULE | Freq: Every day | ORAL | 0 refills | Status: DC

## 2018-10-01 DIAGNOSIS — J449 Chronic obstructive pulmonary disease, unspecified: Secondary | ICD-10-CM | POA: Diagnosis not present

## 2018-10-01 DIAGNOSIS — D509 Iron deficiency anemia, unspecified: Secondary | ICD-10-CM | POA: Diagnosis not present

## 2018-10-01 DIAGNOSIS — I5030 Unspecified diastolic (congestive) heart failure: Secondary | ICD-10-CM | POA: Diagnosis not present

## 2018-10-01 DIAGNOSIS — I69354 Hemiplegia and hemiparesis following cerebral infarction affecting left non-dominant side: Secondary | ICD-10-CM | POA: Diagnosis not present

## 2018-10-01 DIAGNOSIS — I130 Hypertensive heart and chronic kidney disease with heart failure and stage 1 through stage 4 chronic kidney disease, or unspecified chronic kidney disease: Secondary | ICD-10-CM | POA: Diagnosis not present

## 2018-10-01 DIAGNOSIS — N183 Chronic kidney disease, stage 3 (moderate): Secondary | ICD-10-CM | POA: Diagnosis not present

## 2018-10-02 DIAGNOSIS — I130 Hypertensive heart and chronic kidney disease with heart failure and stage 1 through stage 4 chronic kidney disease, or unspecified chronic kidney disease: Secondary | ICD-10-CM | POA: Diagnosis not present

## 2018-10-02 DIAGNOSIS — I69354 Hemiplegia and hemiparesis following cerebral infarction affecting left non-dominant side: Secondary | ICD-10-CM | POA: Diagnosis not present

## 2018-10-02 DIAGNOSIS — I5030 Unspecified diastolic (congestive) heart failure: Secondary | ICD-10-CM | POA: Diagnosis not present

## 2018-10-02 DIAGNOSIS — J449 Chronic obstructive pulmonary disease, unspecified: Secondary | ICD-10-CM | POA: Diagnosis not present

## 2018-10-02 DIAGNOSIS — D509 Iron deficiency anemia, unspecified: Secondary | ICD-10-CM | POA: Diagnosis not present

## 2018-10-02 DIAGNOSIS — N183 Chronic kidney disease, stage 3 (moderate): Secondary | ICD-10-CM | POA: Diagnosis not present

## 2018-10-05 ENCOUNTER — Other Ambulatory Visit: Payer: Self-pay | Admitting: Internal Medicine

## 2018-10-05 DIAGNOSIS — N183 Chronic kidney disease, stage 3 (moderate): Secondary | ICD-10-CM | POA: Diagnosis not present

## 2018-10-05 DIAGNOSIS — I130 Hypertensive heart and chronic kidney disease with heart failure and stage 1 through stage 4 chronic kidney disease, or unspecified chronic kidney disease: Secondary | ICD-10-CM | POA: Diagnosis not present

## 2018-10-05 DIAGNOSIS — J449 Chronic obstructive pulmonary disease, unspecified: Secondary | ICD-10-CM | POA: Diagnosis not present

## 2018-10-05 DIAGNOSIS — D509 Iron deficiency anemia, unspecified: Secondary | ICD-10-CM | POA: Diagnosis not present

## 2018-10-05 DIAGNOSIS — I69354 Hemiplegia and hemiparesis following cerebral infarction affecting left non-dominant side: Secondary | ICD-10-CM | POA: Diagnosis not present

## 2018-10-05 DIAGNOSIS — I5030 Unspecified diastolic (congestive) heart failure: Secondary | ICD-10-CM | POA: Diagnosis not present

## 2018-10-06 DIAGNOSIS — I130 Hypertensive heart and chronic kidney disease with heart failure and stage 1 through stage 4 chronic kidney disease, or unspecified chronic kidney disease: Secondary | ICD-10-CM | POA: Diagnosis not present

## 2018-10-06 DIAGNOSIS — I5030 Unspecified diastolic (congestive) heart failure: Secondary | ICD-10-CM | POA: Diagnosis not present

## 2018-10-06 DIAGNOSIS — J449 Chronic obstructive pulmonary disease, unspecified: Secondary | ICD-10-CM | POA: Diagnosis not present

## 2018-10-06 DIAGNOSIS — I69354 Hemiplegia and hemiparesis following cerebral infarction affecting left non-dominant side: Secondary | ICD-10-CM | POA: Diagnosis not present

## 2018-10-06 DIAGNOSIS — N183 Chronic kidney disease, stage 3 (moderate): Secondary | ICD-10-CM | POA: Diagnosis not present

## 2018-10-06 DIAGNOSIS — D509 Iron deficiency anemia, unspecified: Secondary | ICD-10-CM | POA: Diagnosis not present

## 2018-10-10 DIAGNOSIS — I5030 Unspecified diastolic (congestive) heart failure: Secondary | ICD-10-CM | POA: Diagnosis not present

## 2018-10-10 DIAGNOSIS — I69354 Hemiplegia and hemiparesis following cerebral infarction affecting left non-dominant side: Secondary | ICD-10-CM | POA: Diagnosis not present

## 2018-10-10 DIAGNOSIS — D509 Iron deficiency anemia, unspecified: Secondary | ICD-10-CM | POA: Diagnosis not present

## 2018-10-10 DIAGNOSIS — J449 Chronic obstructive pulmonary disease, unspecified: Secondary | ICD-10-CM | POA: Diagnosis not present

## 2018-10-10 DIAGNOSIS — G4733 Obstructive sleep apnea (adult) (pediatric): Secondary | ICD-10-CM | POA: Diagnosis not present

## 2018-10-10 DIAGNOSIS — C3491 Malignant neoplasm of unspecified part of right bronchus or lung: Secondary | ICD-10-CM | POA: Diagnosis not present

## 2018-10-10 DIAGNOSIS — R7303 Prediabetes: Secondary | ICD-10-CM | POA: Diagnosis not present

## 2018-10-10 DIAGNOSIS — J9612 Chronic respiratory failure with hypercapnia: Secondary | ICD-10-CM | POA: Diagnosis not present

## 2018-10-10 DIAGNOSIS — Z9181 History of falling: Secondary | ICD-10-CM | POA: Diagnosis not present

## 2018-10-10 DIAGNOSIS — J9611 Chronic respiratory failure with hypoxia: Secondary | ICD-10-CM | POA: Diagnosis not present

## 2018-10-10 DIAGNOSIS — M1990 Unspecified osteoarthritis, unspecified site: Secondary | ICD-10-CM | POA: Diagnosis not present

## 2018-10-10 DIAGNOSIS — M109 Gout, unspecified: Secondary | ICD-10-CM | POA: Diagnosis not present

## 2018-10-10 DIAGNOSIS — I130 Hypertensive heart and chronic kidney disease with heart failure and stage 1 through stage 4 chronic kidney disease, or unspecified chronic kidney disease: Secondary | ICD-10-CM | POA: Diagnosis not present

## 2018-10-10 DIAGNOSIS — Z7982 Long term (current) use of aspirin: Secondary | ICD-10-CM | POA: Diagnosis not present

## 2018-10-10 DIAGNOSIS — N183 Chronic kidney disease, stage 3 (moderate): Secondary | ICD-10-CM | POA: Diagnosis not present

## 2018-10-11 ENCOUNTER — Ambulatory Visit: Payer: Medicare Other

## 2018-10-11 DIAGNOSIS — I69354 Hemiplegia and hemiparesis following cerebral infarction affecting left non-dominant side: Secondary | ICD-10-CM | POA: Diagnosis not present

## 2018-10-11 DIAGNOSIS — N183 Chronic kidney disease, stage 3 (moderate): Secondary | ICD-10-CM | POA: Diagnosis not present

## 2018-10-11 DIAGNOSIS — I130 Hypertensive heart and chronic kidney disease with heart failure and stage 1 through stage 4 chronic kidney disease, or unspecified chronic kidney disease: Secondary | ICD-10-CM | POA: Diagnosis not present

## 2018-10-11 DIAGNOSIS — D509 Iron deficiency anemia, unspecified: Secondary | ICD-10-CM | POA: Diagnosis not present

## 2018-10-11 DIAGNOSIS — I5030 Unspecified diastolic (congestive) heart failure: Secondary | ICD-10-CM | POA: Diagnosis not present

## 2018-10-11 DIAGNOSIS — J449 Chronic obstructive pulmonary disease, unspecified: Secondary | ICD-10-CM | POA: Diagnosis not present

## 2018-10-12 ENCOUNTER — Other Ambulatory Visit: Payer: Self-pay

## 2018-10-12 ENCOUNTER — Ambulatory Visit (INDEPENDENT_AMBULATORY_CARE_PROVIDER_SITE_OTHER): Payer: Medicare Other | Admitting: *Deleted

## 2018-10-12 DIAGNOSIS — I5030 Unspecified diastolic (congestive) heart failure: Secondary | ICD-10-CM | POA: Diagnosis not present

## 2018-10-12 DIAGNOSIS — I639 Cerebral infarction, unspecified: Secondary | ICD-10-CM

## 2018-10-12 DIAGNOSIS — N183 Chronic kidney disease, stage 3 (moderate): Secondary | ICD-10-CM | POA: Diagnosis not present

## 2018-10-12 DIAGNOSIS — D509 Iron deficiency anemia, unspecified: Secondary | ICD-10-CM | POA: Diagnosis not present

## 2018-10-12 DIAGNOSIS — I69354 Hemiplegia and hemiparesis following cerebral infarction affecting left non-dominant side: Secondary | ICD-10-CM | POA: Diagnosis not present

## 2018-10-12 DIAGNOSIS — I130 Hypertensive heart and chronic kidney disease with heart failure and stage 1 through stage 4 chronic kidney disease, or unspecified chronic kidney disease: Secondary | ICD-10-CM | POA: Diagnosis not present

## 2018-10-12 DIAGNOSIS — J449 Chronic obstructive pulmonary disease, unspecified: Secondary | ICD-10-CM | POA: Diagnosis not present

## 2018-10-12 LAB — CUP PACEART REMOTE DEVICE CHECK
Date Time Interrogation Session: 20200324203639
Implantable Pulse Generator Implant Date: 20200220

## 2018-10-13 ENCOUNTER — Other Ambulatory Visit: Payer: Self-pay

## 2018-10-13 ENCOUNTER — Ambulatory Visit (INDEPENDENT_AMBULATORY_CARE_PROVIDER_SITE_OTHER): Payer: Medicare Other | Admitting: Family Medicine

## 2018-10-13 ENCOUNTER — Encounter: Payer: Self-pay | Admitting: Family Medicine

## 2018-10-13 DIAGNOSIS — I10 Essential (primary) hypertension: Secondary | ICD-10-CM | POA: Diagnosis not present

## 2018-10-13 DIAGNOSIS — J4530 Mild persistent asthma, uncomplicated: Secondary | ICD-10-CM

## 2018-10-13 DIAGNOSIS — I639 Cerebral infarction, unspecified: Secondary | ICD-10-CM | POA: Diagnosis not present

## 2018-10-13 DIAGNOSIS — M109 Gout, unspecified: Secondary | ICD-10-CM | POA: Diagnosis not present

## 2018-10-13 DIAGNOSIS — I5030 Unspecified diastolic (congestive) heart failure: Secondary | ICD-10-CM

## 2018-10-13 MED ORDER — FUROSEMIDE 40 MG PO TABS: 40.0000 mg | ORAL_TABLET | Freq: Two times a day (BID) | ORAL | 0 refills | Status: DC

## 2018-10-13 MED ORDER — BUDESONIDE-FORMOTEROL FUMARATE 160-4.5 MCG/ACT IN AERO: 2.0000 | INHALATION_SPRAY | Freq: Two times a day (BID) | RESPIRATORY_TRACT | 0 refills | Status: DC

## 2018-10-13 MED ORDER — FEBUXOSTAT 40 MG PO TABS: 40.0000 mg | ORAL_TABLET | Freq: Every day | ORAL | 1 refills | Status: DC

## 2018-10-13 MED ORDER — METOLAZONE 2.5 MG PO TABS: 2.5000 mg | ORAL_TABLET | ORAL | 0 refills | Status: DC

## 2018-10-13 MED ORDER — BUDESONIDE-FORMOTEROL FUMARATE 160-4.5 MCG/ACT IN AERO: 2.0000 | INHALATION_SPRAY | Freq: Two times a day (BID) | RESPIRATORY_TRACT | 1 refills | Status: DC

## 2018-10-13 MED ORDER — FUROSEMIDE 40 MG PO TABS: 40.0000 mg | ORAL_TABLET | Freq: Two times a day (BID) | ORAL | 1 refills | Status: DC

## 2018-10-13 MED ORDER — POTASSIUM CHLORIDE CRYS ER 20 MEQ PO TBCR: 20.0000 meq | EXTENDED_RELEASE_TABLET | Freq: Every day | ORAL | 0 refills | Status: DC

## 2018-10-13 MED ORDER — ALBUTEROL SULFATE HFA 108 (90 BASE) MCG/ACT IN AERS: 2.0000 | INHALATION_SPRAY | Freq: Four times a day (QID) | RESPIRATORY_TRACT | 0 refills | Status: DC | PRN

## 2018-10-13 MED ORDER — POTASSIUM CHLORIDE CRYS ER 20 MEQ PO TBCR: 20.0000 meq | EXTENDED_RELEASE_TABLET | Freq: Every day | ORAL | 1 refills | Status: DC

## 2018-10-13 MED ORDER — ATORVASTATIN CALCIUM 40 MG PO TABS: 40.0000 mg | ORAL_TABLET | Freq: Every day | ORAL | 1 refills | Status: DC

## 2018-10-13 MED ORDER — MONTELUKAST SODIUM 10 MG PO TABS: 10.0000 mg | ORAL_TABLET | Freq: Every day | ORAL | 0 refills | Status: DC

## 2018-10-13 MED ORDER — ATORVASTATIN CALCIUM 40 MG PO TABS: 40.0000 mg | ORAL_TABLET | Freq: Every day | ORAL | 0 refills | Status: DC

## 2018-10-13 MED ORDER — MONTELUKAST SODIUM 10 MG PO TABS: 10.0000 mg | ORAL_TABLET | Freq: Every day | ORAL | 1 refills | Status: DC

## 2018-10-13 MED ORDER — METOLAZONE 2.5 MG PO TABS: 2.5000 mg | ORAL_TABLET | ORAL | 1 refills | Status: DC

## 2018-10-13 MED ORDER — FEBUXOSTAT 40 MG PO TABS: 40.0000 mg | ORAL_TABLET | Freq: Every day | ORAL | 0 refills | Status: DC

## 2018-10-13 MED ORDER — ALBUTEROL SULFATE HFA 108 (90 BASE) MCG/ACT IN AERS: 2.0000 | INHALATION_SPRAY | Freq: Four times a day (QID) | RESPIRATORY_TRACT | 1 refills | Status: DC | PRN

## 2018-10-13 NOTE — Progress Notes (Signed)
  Patient [facility]  Virtual Visit via Telephone Note  I connected with Pamela [name] on 10/13/18 at 10:40 AM EDT by telephone and verified that I am speaking with the correct person using two identifiers.   I discussed the limitations, risks, security and privacy concerns of performing an evaluation and management service by telephone and the availability of in person appointments. I also discussed with the patient that there may be a patient responsible charge related to this service. The patient expressed understanding and agreed to proceed.   History of Present Illness: Patient hospitalized 09/06/2018-09/09/2018. Returned to the ED on 09/13/2018- hospitalized and [facility]. Discharged on 09/29/2018.  Patient states that she is feeling better. She reports being seen at home by PT 2 times per week. Home health nurse comes by weekly.  She states that she has family at home with her and they are helping with her ADLs.  Observations/Objective: Patient does not sound to be in distress. Speaking in normal rate, tone and rhythm.    Assessment and Plan: 1. Acute CVA (cerebrovascular accident) ([place])  2. Diastolic congestive heart failure, unspecified HF chronicity ([place])  3. Essential hypertension Patient with PMH as noted above. She is resting at home and will follow up in 3 months. She has support of her family with her during this time.      Follow Up Instructions:    I discussed the assessment and treatment plan with the patient. The patient was provided an opportunity to ask questions and all were answered. The patient agreed with the plan and demonstrated an understanding of the instructions.   The patient was advised to call back or seek an in-person evaluation if the symptoms worsen or if the condition fails to improve as anticipated.  I provided 15 minutes of non-face-to-face time during this encounter.   Lanae Boast,  FNP

## 2018-10-14 DIAGNOSIS — N183 Chronic kidney disease, stage 3 (moderate): Secondary | ICD-10-CM | POA: Diagnosis not present

## 2018-10-14 DIAGNOSIS — J449 Chronic obstructive pulmonary disease, unspecified: Secondary | ICD-10-CM | POA: Diagnosis not present

## 2018-10-14 DIAGNOSIS — I5030 Unspecified diastolic (congestive) heart failure: Secondary | ICD-10-CM | POA: Diagnosis not present

## 2018-10-14 DIAGNOSIS — I69354 Hemiplegia and hemiparesis following cerebral infarction affecting left non-dominant side: Secondary | ICD-10-CM | POA: Diagnosis not present

## 2018-10-14 DIAGNOSIS — D509 Iron deficiency anemia, unspecified: Secondary | ICD-10-CM | POA: Diagnosis not present

## 2018-10-14 DIAGNOSIS — I130 Hypertensive heart and chronic kidney disease with heart failure and stage 1 through stage 4 chronic kidney disease, or unspecified chronic kidney disease: Secondary | ICD-10-CM | POA: Diagnosis not present

## 2018-10-17 DIAGNOSIS — I5030 Unspecified diastolic (congestive) heart failure: Secondary | ICD-10-CM | POA: Diagnosis not present

## 2018-10-17 DIAGNOSIS — I130 Hypertensive heart and chronic kidney disease with heart failure and stage 1 through stage 4 chronic kidney disease, or unspecified chronic kidney disease: Secondary | ICD-10-CM | POA: Diagnosis not present

## 2018-10-17 DIAGNOSIS — N183 Chronic kidney disease, stage 3 (moderate): Secondary | ICD-10-CM | POA: Diagnosis not present

## 2018-10-17 DIAGNOSIS — I69354 Hemiplegia and hemiparesis following cerebral infarction affecting left non-dominant side: Secondary | ICD-10-CM | POA: Diagnosis not present

## 2018-10-17 DIAGNOSIS — D509 Iron deficiency anemia, unspecified: Secondary | ICD-10-CM | POA: Diagnosis not present

## 2018-10-17 DIAGNOSIS — J449 Chronic obstructive pulmonary disease, unspecified: Secondary | ICD-10-CM | POA: Diagnosis not present

## 2018-10-17 NOTE — Progress Notes (Signed)
[facility] Summary Report / Loop Recorder 

## 2018-10-19 DIAGNOSIS — N183 Chronic kidney disease, stage 3 (moderate): Secondary | ICD-10-CM | POA: Diagnosis not present

## 2018-10-19 DIAGNOSIS — I69354 Hemiplegia and hemiparesis following cerebral infarction affecting left non-dominant side: Secondary | ICD-10-CM | POA: Diagnosis not present

## 2018-10-19 DIAGNOSIS — J449 Chronic obstructive pulmonary disease, unspecified: Secondary | ICD-10-CM | POA: Diagnosis not present

## 2018-10-19 DIAGNOSIS — I5030 Unspecified diastolic (congestive) heart failure: Secondary | ICD-10-CM | POA: Diagnosis not present

## 2018-10-19 DIAGNOSIS — I130 Hypertensive heart and chronic kidney disease with heart failure and stage 1 through stage 4 chronic kidney disease, or unspecified chronic kidney disease: Secondary | ICD-10-CM | POA: Diagnosis not present

## 2018-10-19 DIAGNOSIS — D509 Iron deficiency anemia, unspecified: Secondary | ICD-10-CM | POA: Diagnosis not present

## 2018-10-21 DIAGNOSIS — N183 Chronic kidney disease, stage 3 (moderate): Secondary | ICD-10-CM | POA: Diagnosis not present

## 2018-10-21 DIAGNOSIS — I5030 Unspecified diastolic (congestive) heart failure: Secondary | ICD-10-CM | POA: Diagnosis not present

## 2018-10-21 DIAGNOSIS — J449 Chronic obstructive pulmonary disease, unspecified: Secondary | ICD-10-CM | POA: Diagnosis not present

## 2018-10-21 DIAGNOSIS — I69354 Hemiplegia and hemiparesis following cerebral infarction affecting left non-dominant side: Secondary | ICD-10-CM | POA: Diagnosis not present

## 2018-10-21 DIAGNOSIS — D509 Iron deficiency anemia, unspecified: Secondary | ICD-10-CM | POA: Diagnosis not present

## 2018-10-21 DIAGNOSIS — I130 Hypertensive heart and chronic kidney disease with heart failure and stage 1 through stage 4 chronic kidney disease, or unspecified chronic kidney disease: Secondary | ICD-10-CM | POA: Diagnosis not present

## 2018-10-25 DIAGNOSIS — I130 Hypertensive heart and chronic kidney disease with heart failure and stage 1 through stage 4 chronic kidney disease, or unspecified chronic kidney disease: Secondary | ICD-10-CM | POA: Diagnosis not present

## 2018-10-25 DIAGNOSIS — J449 Chronic obstructive pulmonary disease, unspecified: Secondary | ICD-10-CM | POA: Diagnosis not present

## 2018-10-25 DIAGNOSIS — N183 Chronic kidney disease, stage 3 (moderate): Secondary | ICD-10-CM | POA: Diagnosis not present

## 2018-10-25 DIAGNOSIS — D509 Iron deficiency anemia, unspecified: Secondary | ICD-10-CM | POA: Diagnosis not present

## 2018-10-25 DIAGNOSIS — I5030 Unspecified diastolic (congestive) heart failure: Secondary | ICD-10-CM | POA: Diagnosis not present

## 2018-10-25 DIAGNOSIS — I69354 Hemiplegia and hemiparesis following cerebral infarction affecting left non-dominant side: Secondary | ICD-10-CM | POA: Diagnosis not present

## 2018-10-26 DIAGNOSIS — D509 Iron deficiency anemia, unspecified: Secondary | ICD-10-CM | POA: Diagnosis not present

## 2018-10-26 DIAGNOSIS — I130 Hypertensive heart and chronic kidney disease with heart failure and stage 1 through stage 4 chronic kidney disease, or unspecified chronic kidney disease: Secondary | ICD-10-CM | POA: Diagnosis not present

## 2018-10-26 DIAGNOSIS — J449 Chronic obstructive pulmonary disease, unspecified: Secondary | ICD-10-CM | POA: Diagnosis not present

## 2018-10-26 DIAGNOSIS — N183 Chronic kidney disease, stage 3 (moderate): Secondary | ICD-10-CM | POA: Diagnosis not present

## 2018-10-26 DIAGNOSIS — I5030 Unspecified diastolic (congestive) heart failure: Secondary | ICD-10-CM | POA: Diagnosis not present

## 2018-10-26 DIAGNOSIS — I69354 Hemiplegia and hemiparesis following cerebral infarction affecting left non-dominant side: Secondary | ICD-10-CM | POA: Diagnosis not present

## 2018-10-27 ENCOUNTER — Inpatient Hospital Stay: Payer: Medicare Other | Admitting: Adult Health

## 2018-10-27 DIAGNOSIS — I130 Hypertensive heart and chronic kidney disease with heart failure and stage 1 through stage 4 chronic kidney disease, or unspecified chronic kidney disease: Secondary | ICD-10-CM | POA: Diagnosis not present

## 2018-10-27 DIAGNOSIS — I5030 Unspecified diastolic (congestive) heart failure: Secondary | ICD-10-CM | POA: Diagnosis not present

## 2018-10-27 DIAGNOSIS — I69354 Hemiplegia and hemiparesis following cerebral infarction affecting left non-dominant side: Secondary | ICD-10-CM | POA: Diagnosis not present

## 2018-10-27 DIAGNOSIS — D509 Iron deficiency anemia, unspecified: Secondary | ICD-10-CM | POA: Diagnosis not present

## 2018-10-27 DIAGNOSIS — J449 Chronic obstructive pulmonary disease, unspecified: Secondary | ICD-10-CM | POA: Diagnosis not present

## 2018-10-27 DIAGNOSIS — N183 Chronic kidney disease, stage 3 (moderate): Secondary | ICD-10-CM | POA: Diagnosis not present

## 2018-11-08 ENCOUNTER — Ambulatory Visit: Payer: Medicare Other | Admitting: Registered"

## 2018-11-14 ENCOUNTER — Other Ambulatory Visit: Payer: Self-pay

## 2018-11-14 ENCOUNTER — Ambulatory Visit (INDEPENDENT_AMBULATORY_CARE_PROVIDER_SITE_OTHER): Payer: Medicare Other | Admitting: *Deleted

## 2018-11-14 DIAGNOSIS — I639 Cerebral infarction, unspecified: Secondary | ICD-10-CM

## 2018-11-14 LAB — CUP PACEART REMOTE DEVICE CHECK
Date Time Interrogation Session: 20200426204058
Implantable Pulse Generator Implant Date: 20200220

## 2018-11-15 NOTE — Telephone Encounter (Signed)
Message sent to provider 

## 2018-11-22 NOTE — Progress Notes (Signed)
[facility] Summary Report / Loop Recorder 

## 2018-11-24 NOTE — Telephone Encounter (Signed)
Message sent to provider 

## 2018-12-16 ENCOUNTER — Ambulatory Visit (INDEPENDENT_AMBULATORY_CARE_PROVIDER_SITE_OTHER): Payer: Medicare Other | Admitting: *Deleted

## 2018-12-16 DIAGNOSIS — I639 Cerebral infarction, unspecified: Secondary | ICD-10-CM

## 2018-12-17 LAB — CUP PACEART REMOTE DEVICE CHECK
Date Time Interrogation Session: 20200529214129
Implantable Pulse Generator Implant Date: 20200220

## 2018-12-21 NOTE — Progress Notes (Signed)
[facility] Summary Report / Loop Recorder 

## 2019-01-02 ENCOUNTER — Inpatient Hospital Stay (HOSPITAL_COMMUNITY)
Admission: EM | Admit: 2019-01-02 | Discharge: 2019-01-05 | DRG: 178 | Disposition: A | Discharge status: Home / Self Care | Payer: Medicare Other | Attending: Internal Medicine | Admitting: Internal Medicine

## 2019-01-02 ENCOUNTER — Other Ambulatory Visit: Payer: Self-pay

## 2019-01-02 ENCOUNTER — Encounter (HOSPITAL_COMMUNITY): Payer: Self-pay

## 2019-01-02 ENCOUNTER — Inpatient Hospital Stay (HOSPITAL_COMMUNITY): Payer: Medicare Other

## 2019-01-02 DIAGNOSIS — D72810 Lymphocytopenia: Secondary | ICD-10-CM | POA: Diagnosis present

## 2019-01-02 DIAGNOSIS — J9811 Atelectasis: Secondary | ICD-10-CM | POA: Diagnosis not present

## 2019-01-02 DIAGNOSIS — Z9981 Dependence on supplemental oxygen: Secondary | ICD-10-CM | POA: Diagnosis not present

## 2019-01-02 DIAGNOSIS — R279 Unspecified lack of coordination: Secondary | ICD-10-CM | POA: Diagnosis not present

## 2019-01-02 DIAGNOSIS — I10 Essential (primary) hypertension: Secondary | ICD-10-CM | POA: Diagnosis present

## 2019-01-02 DIAGNOSIS — Z85118 Personal history of other malignant neoplasm of bronchus and lung: Secondary | ICD-10-CM | POA: Diagnosis not present

## 2019-01-02 DIAGNOSIS — N183 Chronic kidney disease, stage 3 (moderate): Secondary | ICD-10-CM | POA: Diagnosis present

## 2019-01-02 DIAGNOSIS — E785 Hyperlipidemia, unspecified: Secondary | ICD-10-CM | POA: Diagnosis present

## 2019-01-02 DIAGNOSIS — D573 Sickle-cell trait: Secondary | ICD-10-CM | POA: Diagnosis present

## 2019-01-02 DIAGNOSIS — Z743 Need for continuous supervision: Secondary | ICD-10-CM | POA: Diagnosis not present

## 2019-01-02 DIAGNOSIS — Z6841 Body Mass Index (BMI) 40.0 and over, adult: Secondary | ICD-10-CM | POA: Diagnosis not present

## 2019-01-02 DIAGNOSIS — Q600 Renal agenesis, unilateral: Secondary | ICD-10-CM | POA: Diagnosis not present

## 2019-01-02 DIAGNOSIS — Z8049 Family history of malignant neoplasm of other genital organs: Secondary | ICD-10-CM

## 2019-01-02 DIAGNOSIS — R438 Other disturbances of smell and taste: Secondary | ICD-10-CM | POA: Diagnosis present

## 2019-01-02 DIAGNOSIS — R918 Other nonspecific abnormal finding of lung field: Secondary | ICD-10-CM | POA: Diagnosis not present

## 2019-01-02 DIAGNOSIS — R197 Diarrhea, unspecified: Secondary | ICD-10-CM | POA: Diagnosis not present

## 2019-01-02 DIAGNOSIS — Z841 Family history of disorders of kidney and ureter: Secondary | ICD-10-CM

## 2019-01-02 DIAGNOSIS — E860 Dehydration: Secondary | ICD-10-CM | POA: Diagnosis present

## 2019-01-02 DIAGNOSIS — Z8673 Personal history of transient ischemic attack (TIA), and cerebral infarction without residual deficits: Secondary | ICD-10-CM | POA: Diagnosis not present

## 2019-01-02 DIAGNOSIS — G4733 Obstructive sleep apnea (adult) (pediatric): Secondary | ICD-10-CM | POA: Diagnosis present

## 2019-01-02 DIAGNOSIS — Z7951 Long term (current) use of inhaled steroids: Secondary | ICD-10-CM | POA: Diagnosis not present

## 2019-01-02 DIAGNOSIS — N179 Acute kidney failure, unspecified: Secondary | ICD-10-CM | POA: Diagnosis present

## 2019-01-02 DIAGNOSIS — E876 Hypokalemia: Secondary | ICD-10-CM | POA: Diagnosis present

## 2019-01-02 DIAGNOSIS — Z833 Family history of diabetes mellitus: Secondary | ICD-10-CM

## 2019-01-02 DIAGNOSIS — J449 Chronic obstructive pulmonary disease, unspecified: Secondary | ICD-10-CM | POA: Diagnosis present

## 2019-01-02 DIAGNOSIS — I5032 Chronic diastolic (congestive) heart failure: Secondary | ICD-10-CM | POA: Diagnosis present

## 2019-01-02 DIAGNOSIS — I130 Hypertensive heart and chronic kidney disease with heart failure and stage 1 through stage 4 chronic kidney disease, or unspecified chronic kidney disease: Secondary | ICD-10-CM | POA: Diagnosis present

## 2019-01-02 DIAGNOSIS — Z807 Family history of other malignant neoplasms of lymphoid, hematopoietic and related tissues: Secondary | ICD-10-CM

## 2019-01-02 DIAGNOSIS — D869 Sarcoidosis, unspecified: Secondary | ICD-10-CM | POA: Diagnosis present

## 2019-01-02 DIAGNOSIS — J9611 Chronic respiratory failure with hypoxia: Secondary | ICD-10-CM | POA: Diagnosis present

## 2019-01-02 DIAGNOSIS — U071 COVID-19: Principal | ICD-10-CM | POA: Diagnosis present

## 2019-01-02 DIAGNOSIS — Z7982 Long term (current) use of aspirin: Secondary | ICD-10-CM

## 2019-01-02 DIAGNOSIS — Z8249 Family history of ischemic heart disease and other diseases of the circulatory system: Secondary | ICD-10-CM

## 2019-01-02 DIAGNOSIS — J9612 Chronic respiratory failure with hypercapnia: Secondary | ICD-10-CM | POA: Diagnosis present

## 2019-01-02 DIAGNOSIS — R0902 Hypoxemia: Secondary | ICD-10-CM | POA: Diagnosis not present

## 2019-01-02 DIAGNOSIS — A09 Infectious gastroenteritis and colitis, unspecified: Secondary | ICD-10-CM | POA: Diagnosis not present

## 2019-01-02 DIAGNOSIS — I469 Cardiac arrest, cause unspecified: Secondary | ICD-10-CM | POA: Diagnosis not present

## 2019-01-02 LAB — COMPREHENSIVE METABOLIC PANEL
ALT: 14 U/L (ref 0–44)
AST: 34 U/L (ref 15–41)
Albumin: 3 g/dL — ABNORMAL LOW (ref 3.5–5.0)
Alkaline Phosphatase: 121 U/L (ref 38–126)
Anion gap: 13 (ref 5–15)
BUN: 55 mg/dL — ABNORMAL HIGH (ref 8–23)
CO2: 29 mmol/L (ref 22–32)
Calcium: 7.4 mg/dL — ABNORMAL LOW (ref 8.9–10.3)
Chloride: 94 mmol/L — ABNORMAL LOW (ref 98–111)
Creatinine, Ser: 2.31 mg/dL — ABNORMAL HIGH (ref 0.44–1.00)
GFR calc Af Amer: 25 mL/min — ABNORMAL LOW (ref >60)
GFR calc non Af Amer: 21 mL/min — ABNORMAL LOW (ref >60)
Glucose, Bld: 105 mg/dL — ABNORMAL HIGH (ref 70–99)
Potassium: 2.9 mmol/L — ABNORMAL LOW (ref 3.5–5.1)
Sodium: 136 mmol/L (ref 135–145)
Total Bilirubin: 0.7 mg/dL (ref 0.3–1.2)
Total Protein: 7.1 g/dL (ref 6.5–8.1)

## 2019-01-02 LAB — CBC WITH DIFFERENTIAL/PLATELET
Abs Immature Granulocytes: 0.01 10*3/uL (ref 0.00–0.07)
Basophils Absolute: 0 10*3/uL (ref 0.0–0.1)
Basophils Relative: 0 %
Eosinophils Absolute: 0 10*3/uL (ref 0.0–0.5)
Eosinophils Relative: 0 %
HCT: 37.7 % (ref 36.0–46.0)
Hemoglobin: 11.9 g/dL — ABNORMAL LOW (ref 12.0–15.0)
Immature Granulocytes: 0 %
Lymphocytes Relative: 22 %
Lymphs Abs: 0.7 10*3/uL (ref 0.7–4.0)
MCH: 21.7 pg — ABNORMAL LOW (ref 26.0–34.0)
MCHC: 31.6 g/dL (ref 30.0–36.0)
MCV: 68.7 fL — ABNORMAL LOW (ref 80.0–100.0)
Monocytes Absolute: 0.3 10*3/uL (ref 0.1–1.0)
Monocytes Relative: 8 %
Neutro Abs: 2.2 10*3/uL (ref 1.7–7.7)
Neutrophils Relative %: 70 %
Platelets: 120 10*3/uL — ABNORMAL LOW (ref 150–400)
RBC: 5.49 MIL/uL — ABNORMAL HIGH (ref 3.87–5.11)
RDW: 14.8 % (ref 11.5–15.5)
WBC: 3.1 10*3/uL — ABNORMAL LOW (ref 4.0–10.5)
nRBC: 0 % (ref 0.0–0.2)

## 2019-01-02 LAB — URINALYSIS, ROUTINE W REFLEX MICROSCOPIC
Bilirubin Urine: NEGATIVE
Glucose, UA: NEGATIVE mg/dL
Hgb urine dipstick: NEGATIVE
Ketones, ur: NEGATIVE mg/dL
Leukocytes,Ua: NEGATIVE
Nitrite: NEGATIVE
Protein, ur: NEGATIVE mg/dL
Specific Gravity, Urine: 1.013 (ref 1.005–1.030)
pH: 6 (ref 5.0–8.0)

## 2019-01-02 LAB — FIBRINOGEN: Fibrinogen: 521 mg/dL — ABNORMAL HIGH (ref 210–475)

## 2019-01-02 LAB — BRAIN NATRIURETIC PEPTIDE: B Natriuretic Peptide: 22.3 pg/mL (ref 0.0–100.0)

## 2019-01-02 LAB — SARS CORONAVIRUS 2: SARS Coronavirus 2: DETECTED — AB

## 2019-01-02 LAB — MAGNESIUM: Magnesium: 1.4 mg/dL — ABNORMAL LOW (ref 1.7–2.4)

## 2019-01-02 LAB — FERRITIN: Ferritin: 132 ng/mL (ref 11–307)

## 2019-01-02 LAB — LACTATE DEHYDROGENASE: LDH: 156 U/L (ref 98–192)

## 2019-01-02 LAB — C-REACTIVE PROTEIN: CRP: 3.8 mg/dL — ABNORMAL HIGH

## 2019-01-02 LAB — PROCALCITONIN: Procalcitonin: <0.1 ng/mL

## 2019-01-02 LAB — D-DIMER, QUANTITATIVE: D-Dimer, Quant: 1.83 ug{FEU}/mL — ABNORMAL HIGH (ref 0.00–0.50)

## 2019-01-02 MED ORDER — ALBUTEROL SULFATE HFA 108 (90 BASE) MCG/ACT IN AERS
2.0000 | INHALATION_SPRAY | RESPIRATORY_TRACT | Status: DC | PRN
  Filled 2019-01-02: qty 6.7

## 2019-01-02 MED ORDER — OXYCODONE HCL 5 MG PO TABS: 5.0000 mg | ORAL_TABLET | ORAL | Status: DC | PRN

## 2019-01-02 MED ORDER — LACTATED RINGERS IV SOLN
INTRAVENOUS | Status: AC
  Administered 2019-01-02: 17:00:00 via INTRAVENOUS

## 2019-01-02 MED ORDER — ASPIRIN 325 MG PO TABS
325.0000 mg | ORAL_TABLET | Freq: Every day | ORAL | Status: DC
  Administered 2019-01-03 – 2019-01-05 (×3): 325 mg via ORAL
  Filled 2019-01-02 (×4): qty 1

## 2019-01-02 MED ORDER — FEBUXOSTAT 40 MG PO TABS
40.0000 mg | ORAL_TABLET | Freq: Every day | ORAL | Status: DC
  Administered 2019-01-03 – 2019-01-05 (×3): 40 mg via ORAL
  Filled 2019-01-02 (×4): qty 1

## 2019-01-02 MED ORDER — MONTELUKAST SODIUM 10 MG PO TABS
10.0000 mg | ORAL_TABLET | Freq: Every day | ORAL | Status: DC
  Administered 2019-01-03 – 2019-01-04 (×2): 10 mg via ORAL
  Filled 2019-01-02 (×2): qty 1

## 2019-01-02 MED ORDER — ONDANSETRON HCL 4 MG PO TABS: 4.0000 mg | ORAL_TABLET | Freq: Four times a day (QID) | ORAL | Status: DC | PRN

## 2019-01-02 MED ORDER — ENOXAPARIN SODIUM 80 MG/0.8ML ~~LOC~~ SOLN
80.0000 mg | SUBCUTANEOUS | Status: DC
  Administered 2019-01-02 – 2019-01-04 (×3): 80 mg via SUBCUTANEOUS
  Filled 2019-01-02 (×3): qty 0.8

## 2019-01-02 MED ORDER — ONDANSETRON HCL 4 MG/2ML IJ SOLN: 4.0000 mg | Freq: Four times a day (QID) | INTRAMUSCULAR | Status: DC | PRN

## 2019-01-02 MED ORDER — POLYETHYLENE GLYCOL 3350 17 G PO PACK: 17.0000 g | PACK | Freq: Every day | ORAL | Status: DC | PRN

## 2019-01-02 MED ORDER — CALCITRIOL 0.25 MCG PO CAPS
0.2500 ug | ORAL_CAPSULE | Freq: Every day | ORAL | Status: DC
  Filled 2019-01-02: qty 1

## 2019-01-02 MED ORDER — ATORVASTATIN CALCIUM 40 MG PO TABS
40.0000 mg | ORAL_TABLET | Freq: Every day | ORAL | Status: DC
  Administered 2019-01-03 – 2019-01-04 (×2): 40 mg via ORAL
  Filled 2019-01-02 (×2): qty 1

## 2019-01-02 MED ORDER — CALCITRIOL 0.25 MCG PO CAPS
0.2500 ug | ORAL_CAPSULE | Freq: Every day | ORAL | Status: DC
  Administered 2019-01-03 – 2019-01-05 (×3): 0.25 ug via ORAL
  Filled 2019-01-02 (×4): qty 1

## 2019-01-02 MED ORDER — METHYLPREDNISOLONE SODIUM SUCC 125 MG IJ SOLR
80.0000 mg | Freq: Two times a day (BID) | INTRAMUSCULAR | Status: DC
  Administered 2019-01-02 – 2019-01-05 (×6): 80 mg via INTRAVENOUS
  Filled 2019-01-02 (×6): qty 2

## 2019-01-02 MED ORDER — SODIUM CHLORIDE 0.9 % IV BOLUS
1000.0000 mL | Freq: Once | INTRAVENOUS | Status: AC
  Administered 2019-01-02: 1000 mL via INTRAVENOUS

## 2019-01-02 MED ORDER — ACETAMINOPHEN 500 MG PO TABS
1000.0000 mg | ORAL_TABLET | Freq: Once | ORAL | Status: AC
  Administered 2019-01-02: 1000 mg via ORAL
  Filled 2019-01-02: qty 2

## 2019-01-02 MED ORDER — POTASSIUM CHLORIDE CRYS ER 20 MEQ PO TBCR
40.0000 meq | EXTENDED_RELEASE_TABLET | Freq: Once | ORAL | Status: AC
  Administered 2019-01-02: 40 meq via ORAL
  Filled 2019-01-02: qty 2

## 2019-01-02 MED ORDER — MOMETASONE FURO-FORMOTEROL FUM 200-5 MCG/ACT IN AERO
2.0000 | INHALATION_SPRAY | Freq: Two times a day (BID) | RESPIRATORY_TRACT | Status: DC
  Administered 2019-01-02 – 2019-01-05 (×6): 2 via RESPIRATORY_TRACT
  Filled 2019-01-02: qty 8.8

## 2019-01-02 MED ORDER — ACETAMINOPHEN 325 MG PO TABS: 650.0000 mg | ORAL_TABLET | Freq: Four times a day (QID) | ORAL | Status: DC | PRN

## 2019-01-02 MED ORDER — DOCUSATE SODIUM 100 MG PO CAPS
100.0000 mg | ORAL_CAPSULE | Freq: Two times a day (BID) | ORAL | Status: DC
  Administered 2019-01-03 – 2019-01-04 (×2): 100 mg via ORAL
  Filled 2019-01-02 (×5): qty 1

## 2019-01-02 MED ORDER — BISACODYL 5 MG PO TBEC: 5.0000 mg | DELAYED_RELEASE_TABLET | Freq: Every day | ORAL | Status: DC | PRN

## 2019-01-02 NOTE — ED Triage Notes (Signed)
Pt arrives POV from home c/o body chills and aches that started on Thursday and diarrhea that started on Friday. Pt denies having a fever, but is febrile now with a temp 100.3. Pt denies being around anyone who is COVID + but states her son currently has pneumonia.

## 2019-01-02 NOTE — ED Notes (Signed)
Got a patient on the monitor patient is resting with call bell in reach

## 2019-01-02 NOTE — ED Notes (Signed)
Got patient up to the bedside toilet patient has call bell in reach 

## 2019-01-02 NOTE — ED Notes (Signed)
[facility] here to transport pt to [facility] via [facility]

## 2019-01-02 NOTE — H&P (Signed)
History and Physical    Kemoni Quesenberry ZOX:[id number] DOB: Sep 11, 1950 DOA: 01/02/2019  PCP: Lanae Boast, FNP Consultants:  [facility] Ripple - cardiology; [name] - pulmonology; [facility] - oncology; [facility] - neurology Patient coming from:  Home   Chief Complaint: Diarrhea  HPI: Beautiful Pensyl is a 68 y.o. female with medical history significant of sickle cell trait; sarcoidosis; OSA; R [place] lung cancer (2012); HTN; COPD on home O2; and CHF presenting with diarrhea.   She reports that she started with chills and myalgias last Thursday and with diarrhea on Friday.  She still has a sense of smell but has lost her sense of taste.  She says that her son has PNA and so he was concerned that she could have COVID.  ED Course:  Diarrhea, AKI.  Community risks for COVID, test pending.  Stool studies pending.  Review of Systems: As per HPI; otherwise review of systems reviewed and negative.   Ambulatory Status:  Ambulates with a cane  Past Medical History:  Diagnosis Date  . Arthritis   . Asthma   . Cancer ([place])    lung, adenocarcinoma right lung 2012  . CHF (congestive heart failure) (HCC)    Preserved EF  . Congenital single kidney    With chronic kidney disease  . COPD (chronic obstructive pulmonary disease) ([place])   . Gout   . Hypertension   . Lung cancer [facility]) 2012   Right upper lobe lung adenocarcinoma diagnosed with needle biopsy treated by SBRT finished treatment April 2013 has been monitored since  . Mass of chest wall, right    Right chest wall mass 7.3 cm biopsy on 12/13/2013. Patient notes it was consistent with sarcoidosis but actual pathology results not available.  . Oxygen deficiency   . Sarcoidosis   . Sickle cell trait ([place])   . Sleep apnea     Past Surgical History:  Procedure Laterality Date  . LOOP RECORDER INSERTION N/A 09/08/2018   Procedure: LOOP RECORDER INSERTION;  Surgeon: Evans Lance, MD;  Location: [place] [facility];  Service: Cardiovascular;  Laterality: N/A;   . LUNG BIOPSY    . TEE WITHOUT CARDIOVERSION N/A 09/08/2018   Procedure: TRANSESOPHAGEAL ECHOCARDIOGRAM (TEE);  Surgeon: Josue Hector, MD;  Location: [facility] ENDOSCOPY;  Service: Cardiovascular;  Laterality: N/A;  with loop  . TUBAL LIGATION    . VEIN LIGATION AND STRIPPING      Social History   Socioeconomic History  . Marital status: Divorced    Spouse name: Not on file  . Number of children: 2  . Years of education: Not on file  . Highest education level: Not on file  Occupational History  . Not on file  Social Needs  . Financial resource strain: Not on file  . Food insecurity    Worry: Not on file    Inability: Not on file  . Transportation needs    Medical: Not on file    Non-medical: Not on file  Tobacco Use  . Smoking status: Never Smoker  . Smokeless tobacco: Never Used  Substance and Sexual Activity  . Alcohol use: No    Alcohol/week: 0.0 standard drinks  . Drug use: No  . Sexual activity: Not on file  Lifestyle  . Physical activity    Days per week: Not on file    Minutes per session: Not on file  . Stress: Not on file  Relationships  . Social connections    Talks on phone: Not on file  Gets together: Not on file    Attends religious service: Not on file    Active member of club or organization: Not on file    Attends meetings of clubs or organizations: Not on file    Relationship status: Not on file  . Intimate partner violence    Fear of current or ex partner: Not on file    Emotionally abused: Not on file    Physically abused: Not on file    Forced sexual activity: Not on file  Other Topics Concern  . Not on file  Social History Narrative   Lives with son.  One son is deceased.      Allergies  Allergen Reactions  . Sulfur Rash    Family History  Problem Relation Age of Onset  . Hypertension Mother   . Renal Disease Mother   . Cancer Father        stomach  . Heart disease Father        No details  . Cancer Brother   . Diabetes Brother    . Cervical cancer Sister   . Diabetes Sister   . Multiple myeloma Sister     Prior to Admission medications   Medication Sig Start Date End Date Taking? Authorizing Provider  acetaminophen (TYLENOL) 325 MG tablet Take 650 mg by mouth every 6 (six) hours as needed for mild pain.    [provider]  albuterol (PROAIR HFA) 108 (90 Base) MCG/ACT inhaler Inhale 2 puffs into the lungs every 6 (six) hours as needed for wheezing or shortness of breath. 10/13/18   Lanae Boast, FNP  albuterol (PROVENTIL) (2.5 MG/3ML) 0.083% nebulizer solution Take 3 mLs (2.5 mg total) by nebulization every 6 (six) hours as needed for wheezing or shortness of breath. 09/30/18   Wille Celeste, PA-C  aspirin 325 MG tablet Take 1 tablet (325 mg total) by mouth daily. 09/30/18   Granville Lewis C, PA-C  atorvastatin (LIPITOR) 40 MG tablet Take 1 tablet (40 mg total) by mouth daily at 6 PM. 10/13/18   Lanae Boast, FNP  budesonide-formoterol [facility]) 160-4.5 MCG/ACT inhaler Inhale 2 puffs into the lungs 2 (two) times daily. 10/13/18   Lanae Boast, FNP  calcitRIOL (ROCALTROL) 0.25 MCG capsule Take 1 capsule (0.25 mcg total) by mouth daily. 09/30/18   Granville Lewis C, PA-C  docusate sodium (COLACE) 100 MG capsule Take 1 capsule (100 mg total) by mouth 2 (two) times daily. 09/30/18   Granville Lewis C, PA-C  febuxostat (ULORIC) 40 MG tablet Take 1 tablet (40 mg total) by mouth daily. 10/13/18   Lanae Boast, FNP  furosemide (LASIX) 40 MG tablet Take 1 tablet (40 mg total) by mouth 2 (two) times daily. For CHF 10/13/18   Lanae Boast, FNP  metolazone (ZAROXOLYN) 2.5 MG tablet Take 1 tablet (2.5 mg total) by mouth 2 (two) times a week. Take on Mondays and Thursdays 10/13/18   Lanae Boast, FNP  montelukast (SINGULAIR) 10 MG tablet Take 1 tablet (10 mg total) by mouth at bedtime. 10/13/18   Lanae Boast, FNP  potassium chloride SA (K-DUR,KLOR-CON) 20 MEQ tablet Take 1 tablet (20 mEq total) by mouth daily. 10/13/18    Lanae Boast, FNP    Physical Exam: Vitals:   01/02/19 1230 01/02/19 1232 01/02/19 1245 01/02/19 1530  BP: (!) 102/58  (!) 89/54 (!) 107/93  Pulse: 67 67 67 63  Resp: _0 Temp:      TempSrc:  SpO2: 97% 97% 94% 94%  Weight:      Height:         . General:  Appears calm and comfortable and is NAD; morbidly obese . Eyes:  PERRL, EOMI, normal lids, iris . ENT:  grossly normal hearing, lips & tongue, mmm . Neck:  no LAD, masses or thyromegaly . Cardiovascular:  RRR, no m/r/g. 2-3+ LE edema.  [name] Kitchen Respiratory:   CTA bilaterally with no wheezes/rales/rhonchi.  Normal respiratory effort. . Abdomen:  soft, NT, ND, NABS . Skin:  no rash or induration seen on limited exam . Musculoskeletal:  grossly normal tone BUE/BLE, good ROM, no bony abnormality . Psychiatric:  grossly normal mood and affect, speech fluent and appropriate, AOx3 . Neurologic:  CN 2-12 grossly intact, moves all extremities in coordinated fashion, sensation intact    Radiological Exams on Admission: No results found.  EKG: not done   Labs on Admission: I have personally reviewed the available labs and imaging studies at the time of the admission.  Pertinent labs:   K+ 2.9 Glucose 105 BUN 55/Creatinine 2.31/GFR 25; baseline 1.4-1.6 Albumin 3.0 BNP 22.3 WBC 3.1; 5.4 on 2/25 Hgb 11.9 - stable Platelets 120 COVID POSITIVE LDH 156 Ferritin 132 CRP 3.8 Procalcitonin <0.10 D-dimer 1.83 Fibrinogen 521 GI pathogen panel pending  Assessment/Plan Principal Problem:   COVID-19 virus infection Active Problems:   Essential hypertension   Morbid (severe) obesity due to excess calories (HCC)   Chronic diastolic CHF (congestive heart failure) (HCC)   Sarcoidosis   Acute renal failure superimposed on stage 3 chronic kidney disease (HCC)   Chronic respiratory failure with hypoxia and hypercapnia (HCC)   Acute infection with COVID-19 -Patient presenting with diarrhea, myalgias, chills -Possible  contact for COVID with her son, who "has pneumonia" -She has worn home O2 in the past but does not have an increased O2 requirement at this time -COVID POSITIVE -The patient has comorbidities which may increase the risk for ARDS/MODS including: age, HTN, obesity, asthma/COPD -Pertinent labs concerning for COVID include leukopenia; increased BUN/Creatinine; low procalcitonin; markedly elevated D-dimer (>1); elevated CRP (>7); increased fibrinogen -CXR is pending, but respiratory complaints are currently minimal  -Will not treat with broad-spectrum antibiotics given procalcitonin <0.1 -Will admit to [facility] for further evaluation, close monitoring, and treatment -Monitor on telemetry x at least 24 hours -At this time, will attempt to avoid use of aerosolized medications and use HFAs instead -Will check daily labs including BMP with Mag, Phos; LFTs; CBC with differential; CRP; ferritin; fibrinogen; D-dimer -Will order steroids (1 mg/kg divided BID)  -Will hold Remdesivir (pharmacy consult) with +COVID test, but without +CXR or worsening hypoxia  -If the patient shows clinical deterioration, consider transfer to ICU with PCCM consultation -If the patient is hypoxic or on >3L oxygen from baseline or CRP >7, considerTocilizumab 8 mg/kg x1 IF + COVID test; O2 sats <88% on room air; and patient is at high risk for intubation.  Will defer to [facility] doctors regarding this medication since it is being used off label. -Will attempt to maintain euvolemia to a net negative fluid status -Will ask the patient to maintain an awake prone position for 16+ hours a day, if possible, with a minimum of 2-3 hours at a time -With D-dimer <5, will use standard-dosed Lovenox for DVT prevention -Patient was seen wearing full PPE including: gown, gloves, head cover, N95, and face shield; donning and doffing was in compliance with current standards.  Acute renal failure on stage 3 CKD -  She does have LE edema but reports that this  is chronic for her -With increased creatinine, suspect she is intravascularly depleted -Will give gentle IVF hydration at 50 cc/hr x 20 hours for a total of 1L IVF -Recheck BMP in AM -Continue Rocaltrol -Hold Lasix and Zaroxolyn for now  Chronic respiratory failure -h/o lung cancer as well as COPD and sarcoidosis -She has been on home O2 in the past although it is not clear that she is currently using this and she is 94% on RA -Continue Albuterol HFA, but hold nebs given COVID -Continue Symbicort and Spiriva  HTN -She does not appear to be taking medications for this issue at this time  Chronic diastolic CHF -As noted above, she currently appears to be volume depleted -However, she is at increased risk for volume overload- particularly given her COVID-19 infection  Morbid obesity -After CVA in 2/20, patient was encouraged to undertake significant lifestyle modifications; it does not appear that this has occurred -Current BMI is 55 -Ongoing efforts should be encouraged   DVT prophylaxis:  Lovenox  Code Status:  Full - confirmed with patient Family Communication: None present; she did not request that I call her family Disposition Plan:  Home once clinically improved Consults called: None  Admission status: Admit - It is my clinical opinion that admission to INPATIENT is reasonable and necessary because of the expectation that this patient will require hospital care that crosses at least 2 midnights to treat this condition based on the medical complexity of the problems presented.  Given the aforementioned information, the predictability of an adverse outcome is felt to be significant.      Karmen Bongo MD [facility]   How to contact the [facility] Attending or Consulting provider [place] or covering provider during after hours [place], for this patient?  1. Check the care team in [facility] and look for a) attending/consulting [facility] provider listed and b) the [facility] team listed 2. Log  into [email or web address] and use [place] universal password to access. If you do not have the password, please contact the hospital operator. 3. Locate the [facility] provider you are looking for under [facility] and page to a number that you can be directly reached. 4. If you still have difficulty reaching the provider, please page the [facility] (Director on Call) for the Hospitalists listed on amion for assistance.   01/02/2019, 5:37 PM

## 2019-01-02 NOTE — ED Notes (Signed)
ED Provider at bedside. 

## 2019-01-02 NOTE — ED Provider Notes (Addendum)
[facility] EMERGENCY DEPARTMENT Provider Note   CSN: [id number] Arrival date & time: 01/02/19  8889    History   Chief Complaint Chief Complaint  Patient presents with  . Chills  . Diarrhea    HPI Pamela [name] is a 68 y.o. female.     HPI Patient presents with concern of ongoing loose stool, chills. Patient has multiple medical issues including CHF, COPD, hypertension, obesity. She notes that over the past 4 days she has had frequent loose stool, without blood, though with mucus. No abdominal pain, no vomiting. She was unaware of fever until today, but did complain of chills. No new medication taken for relief. No recent antibiotics. No recent hospitalizations.  Past Medical History:  Diagnosis Date  . Arthritis   . Asthma   . Cancer ([place])    lung, adenocarcinoma right lung 2012  . CHF (congestive heart failure) (HCC)    Preserved EF  . Congenital single kidney    With chronic kidney disease  . COPD (chronic obstructive pulmonary disease) ([place])   . Gout   . Hypertension   . Lung cancer [facility] [address]) 2012   Right upper lobe lung adenocarcinoma diagnosed with needle biopsy treated by SBRT finished treatment April 2013 has been monitored since  . Mass of chest wall, right    Right chest wall mass 7.3 cm biopsy on 12/13/2013. Patient notes it was consistent with sarcoidosis but actual pathology results not available.  . Oxygen deficiency   . Sarcoidosis   . Sickle cell trait ([place])   . Sleep apnea     Patient Active Problem List   Diagnosis Date Noted  . Right knee pain 09/15/2018  . Closed head injury with concussion 09/06/2018  . Acute CVA (cerebrovascular accident) ([place]) 09/06/2018  . Hypokalemia 09/06/2018  . Hypomagnesemia 09/06/2018  . Asthma, chronic obstructive, with acute exacerbation ([place]) 08/02/2018  . Plantar fasciitis 04/14/2017  . Microcytosis 11/28/2015  . 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 Onychomycosis 01/09/2015  . Ingrown nail  01/09/2015  . Pain in lower limb 01/09/2015  . Chronic respiratory failure with hypoxia and hypercapnia ([place]) 07/19/2014  . CHF (congestive heart failure) ([place]) 06/11/2014  . Anemia, iron deficiency 05/27/2014  . Acute gouty arthritis 05/25/2014  . Acute renal failure superimposed on stage 3 chronic kidney disease ([place]) 05/25/2014  . Obstructive sleep apnea 05/25/2014  . Joint pain 05/24/2014  . Chronic diastolic CHF (congestive heart failure) ([place])   . Sarcoidosis   . Metabolic syndrome 16/94/5038  . Asthma, chronic 04/27/2014  . Anemia of chronic disease 04/19/2014  . Morbid (severe) obesity due to excess calories ([place]) 04/18/2014  . Immunization due 04/18/2014  . Need for prophylactic vaccination and inoculation against influenza 04/18/2014  . Prediabetes 04/13/2014  . Essential hypertension 04/13/2014  . Lower extremity edema 04/13/2014  . History of lung cancer 04/13/2014    Past Surgical History:  Procedure Laterality Date  . LOOP RECORDER INSERTION N/A 09/08/2018   Procedure: LOOP RECORDER INSERTION;  Surgeon: Evans Lance, MD;  Location: [facility];  Service: Cardiovascular;  Laterality: N/A;  . LUNG BIOPSY    . TEE WITHOUT CARDIOVERSION N/A 09/08/2018   Procedure: TRANSESOPHAGEAL ECHOCARDIOGRAM (TEE);  Surgeon: Josue Hector, MD;  Location: [facility] ENDOSCOPY;  Service: Cardiovascular;  Laterality: N/A;  with loop  . TUBAL LIGATION    . VEIN LIGATION AND STRIPPING       OB History   No obstetric history on file.  Home Medications    Prior to Admission medications   Medication Sig Start Date End Date Taking? Authorizing Provider  acetaminophen (TYLENOL) 325 MG tablet Take 650 mg by mouth every 6 (six) hours as needed for mild pain.    [provider]  albuterol (PROAIR HFA) 108 (90 Base) MCG/ACT inhaler Inhale 2 puffs into the lungs every 6 (six) hours as needed for wheezing or shortness of breath. 10/13/18   Lanae Boast, FNP  albuterol  (PROVENTIL) (2.5 MG/3ML) 0.083% nebulizer solution Take 3 mLs (2.5 mg total) by nebulization every 6 (six) hours as needed for wheezing or shortness of breath. 09/30/18   Wille Celeste, PA-C  aspirin 325 MG tablet Take 1 tablet (325 mg total) by mouth daily. 09/30/18   Granville Lewis C, PA-C  atorvastatin (LIPITOR) 40 MG tablet Take 1 tablet (40 mg total) by mouth daily at 6 PM. 10/13/18   Lanae Boast, FNP  budesonide-formoterol [facility]) 160-4.5 MCG/ACT inhaler Inhale 2 puffs into the lungs 2 (two) times daily. 10/13/18   Lanae Boast, FNP  calcitRIOL (ROCALTROL) 0.25 MCG capsule Take 1 capsule (0.25 mcg total) by mouth daily. 09/30/18   Granville Lewis C, PA-C  docusate sodium (COLACE) 100 MG capsule Take 1 capsule (100 mg total) by mouth 2 (two) times daily. 09/30/18   Granville Lewis C, PA-C  febuxostat (ULORIC) 40 MG tablet Take 1 tablet (40 mg total) by mouth daily. 10/13/18   Lanae Boast, FNP  furosemide (LASIX) 40 MG tablet Take 1 tablet (40 mg total) by mouth 2 (two) times daily. For CHF 10/13/18   Lanae Boast, FNP  metolazone (ZAROXOLYN) 2.5 MG tablet Take 1 tablet (2.5 mg total) by mouth 2 (two) times a week. Take on Mondays and Thursdays 10/13/18   Lanae Boast, FNP  montelukast (SINGULAIR) 10 MG tablet Take 1 tablet (10 mg total) by mouth at bedtime. 10/13/18   Lanae Boast, FNP  potassium chloride SA (K-DUR,KLOR-CON) 20 MEQ tablet Take 1 tablet (20 mEq total) by mouth daily. 10/13/18   Lanae Boast, FNP    Family History Family History  Problem Relation Age of Onset  . Hypertension Mother   . Renal Disease Mother   . Cancer Father        stomach  . Heart disease Father        No details  . Cancer Brother   . Diabetes Brother   . Cervical cancer Sister   . Diabetes Sister   . Multiple myeloma Sister     Social History Social History   Tobacco Use  . Smoking status: Never Smoker  . Smokeless tobacco: Never Used  Substance Use Topics  . Alcohol use: No     Alcohol/week: 0.0 standard drinks  . Drug use: No     Allergies   Sulfur   Review of Systems Review of Systems  Constitutional:       Per HPI, otherwise negative  HENT:       Per HPI, otherwise negative  Respiratory:       Per HPI, otherwise negative  Cardiovascular:       Per HPI, otherwise negative  Gastrointestinal: Negative for vomiting.  Endocrine:       Negative aside from HPI  Genitourinary:       Neg aside from HPI   Musculoskeletal:       Per HPI, otherwise negative  Skin: Negative.   Neurological: Negative for syncope.     Physical Exam Updated Vital Signs BP [name] Kitchen)  89/54 (BP Location: Right Arm)   Pulse 67   Temp 100.3 F (37.9 C) (Oral)   Resp 17   Ht '5\' 6"'$  (1.676 m)   Wt (!) 155.6 kg   SpO2 94%   BMI 55.36 kg/m   Physical Exam Vitals signs and nursing note reviewed.  Constitutional:      General: She is not in acute distress.    Appearance: She is well-developed. She is obese.  HENT:     Head: Normocephalic and atraumatic.  Eyes:     Conjunctiva/sclera: Conjunctivae normal.  Cardiovascular:     Rate and Rhythm: Normal rate and regular rhythm.  Pulmonary:     Effort: Pulmonary effort is normal. No respiratory distress.     Breath sounds: Normal breath sounds. No stridor.  Abdominal:     General: There is no distension.     Tenderness: There is no abdominal tenderness. There is no guarding.  Musculoskeletal:     Right lower leg: Edema present.     Left lower leg: Edema present.  Skin:    General: Skin is warm and dry.  Neurological:     Mental Status: She is alert and oriented to person, place, and time.     Cranial Nerves: No cranial nerve deficit.      ED Treatments / Results  Labs (all labs ordered are listed, but only abnormal results are displayed) Labs Reviewed  COMPREHENSIVE METABOLIC PANEL - Abnormal; Notable for the following components:      Result Value   Potassium 2.9 (*)    Chloride 94 (*)    Glucose, Bld 105 (*)     BUN 55 (*)    Creatinine, Ser 2.31 (*)    Calcium 7.4 (*)    Albumin 3.0 (*)    GFR calc non Af Amer 21 (*)    GFR calc Af Amer 25 (*)    All other components within normal limits  CBC WITH DIFFERENTIAL/PLATELET - Abnormal; Notable for the following components:   WBC 3.1 (*)    RBC 5.49 (*)    Hemoglobin 11.9 (*)    MCV 68.7 (*)    MCH 21.7 (*)    Platelets 120 (*)    All other components within normal limits  MAGNESIUM - Abnormal; Notable for the following components:   Magnesium 1.4 (*)    All other components within normal limits  GASTROINTESTINAL PANEL BY PCR, STOOL (REPLACES STOOL CULTURE)  SARS CORONAVIRUS 2  BRAIN NATRIURETIC PEPTIDE  URINALYSIS, ROUTINE W REFLEX MICROSCOPIC    EKG None  Radiology No results found.  Procedures Procedures (including critical care time)  Medications Ordered in ED Medications  sodium chloride 0.9 % bolus 1,000 mL (has no administration in time range)  acetaminophen (TYLENOL) tablet 1,000 mg (1,000 mg Oral Given 01/02/19 1055)     Initial Impression / Assessment and Plan / ED Course  I have reviewed the triage vital signs and the nursing notes.  Pertinent labs & imaging results that were available during my care of the patient were reviewed by me and considered in my medical decision making (see chart for details).    Labs notable for elevated creatinine, hypokalemia.    1:40 PM Patient continues to have mildly depressed blood pressure, and given concern for acute kidney injury, with history of heart failure patient will require admission.  When here she has received initial fluid resuscitation. With soft abdomen, no vomiting, there is no evidence for obstruction. Stool culture has been  sent, coronavirus panel sent.  Silvana Holecek was evaluated in Emergency Department on 01/02/2019 for the symptoms described in the history of present illness. She was evaluated in the context of the global COVID-19 pandemic, which necessitated  consideration that the patient might be at risk for infection with the SARS-CoV-2 virus that causes COVID-19. Institutional protocols and algorithms that pertain to the evaluation of patients at risk for COVID-19 are in a state of rapid change based on information released by regulatory bodies including the CDC and federal and state organizations. These policies and algorithms were followed during the patient's care in the ED.   Final Clinical Impressions(s) / ED Diagnoses   Final diagnoses:  Diarrhea of presumed infectious origin  AKI (acute kidney injury) ([place])  COVID-19 virus infection     Carmin Muskrat, MD 01/02/19 1342    Carmin Muskrat, MD 01/02/19 1530

## 2019-01-02 NOTE — ED Notes (Signed)
This RN acting as Art therapist and asked pt if she would like for me to call any friends/family. Pt declined and states she is able too keep family informed.

## 2019-01-03 ENCOUNTER — Encounter (HOSPITAL_COMMUNITY): Payer: Self-pay

## 2019-01-03 DIAGNOSIS — J9612 Chronic respiratory failure with hypercapnia: Secondary | ICD-10-CM

## 2019-01-03 DIAGNOSIS — I5032 Chronic diastolic (congestive) heart failure: Secondary | ICD-10-CM

## 2019-01-03 DIAGNOSIS — J9611 Chronic respiratory failure with hypoxia: Secondary | ICD-10-CM

## 2019-01-03 DIAGNOSIS — N183 Chronic kidney disease, stage 3 (moderate): Secondary | ICD-10-CM

## 2019-01-03 DIAGNOSIS — N179 Acute kidney failure, unspecified: Secondary | ICD-10-CM

## 2019-01-03 DIAGNOSIS — R197 Diarrhea, unspecified: Secondary | ICD-10-CM

## 2019-01-03 LAB — COMPREHENSIVE METABOLIC PANEL
ALT: 15 U/L (ref 0–44)
AST: 34 U/L (ref 15–41)
Albumin: 2.8 g/dL — ABNORMAL LOW (ref 3.5–5.0)
Alkaline Phosphatase: 105 U/L (ref 38–126)
Anion gap: 16 — ABNORMAL HIGH (ref 5–15)
BUN: 52 mg/dL — ABNORMAL HIGH (ref 8–23)
CO2: 27 mmol/L (ref 22–32)
Calcium: 7.3 mg/dL — ABNORMAL LOW (ref 8.9–10.3)
Chloride: 97 mmol/L — ABNORMAL LOW (ref 98–111)
Creatinine, Ser: 1.73 mg/dL — ABNORMAL HIGH (ref 0.44–1.00)
GFR calc Af Amer: 35 mL/min — ABNORMAL LOW (ref >60)
GFR calc non Af Amer: 30 mL/min — ABNORMAL LOW (ref >60)
Glucose, Bld: 158 mg/dL — ABNORMAL HIGH (ref 70–99)
Potassium: 3.6 mmol/L (ref 3.5–5.1)
Sodium: 140 mmol/L (ref 135–145)
Total Bilirubin: 0.5 mg/dL (ref 0.3–1.2)
Total Protein: 6.5 g/dL (ref 6.5–8.1)

## 2019-01-03 LAB — CBC WITH DIFFERENTIAL/PLATELET
Abs Immature Granulocytes: 0.02 10*3/uL (ref 0.00–0.07)
Basophils Absolute: 0 10*3/uL (ref 0.0–0.1)
Basophils Relative: 0 %
Eosinophils Absolute: 0 10*3/uL (ref 0.0–0.5)
Eosinophils Relative: 0 %
HCT: 36.2 % (ref 36.0–46.0)
Hemoglobin: 11.4 g/dL — ABNORMAL LOW (ref 12.0–15.0)
Immature Granulocytes: 1 %
Lymphocytes Relative: 21 %
Lymphs Abs: 0.4 10*3/uL — ABNORMAL LOW (ref 0.7–4.0)
MCH: 21.8 pg — ABNORMAL LOW (ref 26.0–34.0)
MCHC: 31.5 g/dL (ref 30.0–36.0)
MCV: 69.1 fL — ABNORMAL LOW (ref 80.0–100.0)
Monocytes Absolute: 0.1 10*3/uL (ref 0.1–1.0)
Monocytes Relative: 5 %
Neutro Abs: 1.4 10*3/uL — ABNORMAL LOW (ref 1.7–7.7)
Neutrophils Relative %: 73 %
Platelets: 105 10*3/uL — ABNORMAL LOW (ref 150–400)
RBC: 5.24 MIL/uL — ABNORMAL HIGH (ref 3.87–5.11)
RDW: 15.3 % (ref 11.5–15.5)
WBC: 1.9 10*3/uL — ABNORMAL LOW (ref 4.0–10.5)
nRBC: 0 % (ref 0.0–0.2)

## 2019-01-03 LAB — GASTROINTESTINAL PANEL BY PCR, STOOL (REPLACES STOOL CULTURE)

## 2019-01-03 LAB — FERRITIN: Ferritin: 149 ng/mL (ref 11–307)

## 2019-01-03 LAB — FIBRINOGEN: Fibrinogen: 649 mg/dL — ABNORMAL HIGH (ref 210–475)

## 2019-01-03 LAB — LACTATE DEHYDROGENASE: LDH: 173 U/L (ref 98–192)

## 2019-01-03 LAB — MAGNESIUM: Magnesium: 1.5 mg/dL — ABNORMAL LOW (ref 1.7–2.4)

## 2019-01-03 LAB — C DIFFICILE QUICK SCREEN W PCR REFLEX
C Diff antigen: NEGATIVE
C Diff interpretation: NOT DETECTED
C Diff toxin: NEGATIVE

## 2019-01-03 LAB — PHOSPHORUS: Phosphorus: 2.9 mg/dL (ref 2.5–4.6)

## 2019-01-03 LAB — C-REACTIVE PROTEIN: CRP: 4.3 mg/dL — ABNORMAL HIGH (ref <1.0)

## 2019-01-03 LAB — D-DIMER, QUANTITATIVE: D-Dimer, Quant: 2.44 ug/mL-FEU — ABNORMAL HIGH (ref 0.00–0.50)

## 2019-01-03 NOTE — Progress Notes (Signed)
PROGRESS NOTE  Pamela [name] DUK:[id number] DOB: 01/26/1951 DOA: 01/02/2019 PCP: Lanae Boast, FNP   LOS: 1 day   Brief Narrative / Interim history: 68 year old female with sickle cell trait, sarcoidosis, OSA, COPD on chronic home O2, diastolic CHF came into the hospital with fever, chills, as well as diarrhea.  She also reports loss of taste and mild loss of smell.  Her son has been diagnosed with COVID pneumonia.  She was found to be dehydrated with acute kidney injury and was admitted for IV fluids.  Chest x-ray on admission without infiltrates  Subjective: She is feeling better this morning, still has ongoing diarrhea  Assessment & Plan: Principal Problem:   COVID-19 virus infection Active Problems:   Essential hypertension   Morbid (severe) obesity due to excess calories (HCC)   Chronic diastolic CHF (congestive heart failure) (HCC)   Sarcoidosis   Acute renal failure superimposed on stage 3 chronic kidney disease (HCC)   Chronic respiratory failure with hypoxia and hypercapnia (HCC)   Principal Problem Acute infection with COVID-19, profuse diarrhea -Patient with diarrhea, myalgia, fever and chills.  May be atypical presentation of COVID-19 infection.  She currently has no respiratory complaints -Inflammatory markers minimally elevated, continue to monitor -GI pathogen panel and C. difficile PCR sent -Given IV fluids overnight, continue today, encourage p.o. intake  COVID-19 Labs  Recent Labs    01/02/19 1531 01/03/19 0233  DDIMER 1.83*  --   FERRITIN 132 149  LDH 156 173  CRP 3.8* 4.3*    Lab Results  Component Value Date   SARSCOV2NAA DETECTED (A) 01/02/2019    Active Problems Acute kidney injury on chronic kidney disease stage III -Patient has chronic lower extremity edema, at baseline -She received IV fluids, creatinine has improved this morning, continue IV fluids until tomorrow -Recheck BMP in a.m. -Hold home Lasix  COPD with chronic hypoxic  respiratory failure -Also history of lung cancer as well as sarcoidosis, continue butyryl inhaler, Symbicort, Spiriva  Hypertension -Stable  Chronic diastolic CHF -Appears intravascularly depleted with a degree of third spacing in the lower extremities  Morbid obesity -Would benefit from weight loss  Hyperlipidemia -Continue statin  Prior CVA -Continue aspirin  Scheduled Meds: . aspirin  325 mg Oral Daily  . atorvastatin  40 mg Oral q1800  . calcitRIOL  0.25 mcg Oral Daily  . docusate sodium  100 mg Oral BID  . enoxaparin (LOVENOX) injection  80 mg Subcutaneous Q24H  . febuxostat  40 mg Oral Daily  . methylPREDNISolone (SOLU-MEDROL) injection  80 mg Intravenous Q12H  . mometasone-formoterol  2 puff Inhalation BID  . montelukast  10 mg Oral QHS   Continuous Infusions: PRN Meds:.acetaminophen, albuterol, bisacodyl, ondansetron **OR** ondansetron (ZOFRAN) IV, oxyCODONE, polyethylene glycol  DVT prophylaxis: Lovenox Code Status: Full code Family Communication: d/w patient  Disposition Plan: home when ready   Consultants:   None   Procedures:   None   Antimicrobials:  None    Objective: Vitals:   01/02/19 2100 01/02/19 2204 01/03/19 0400 01/03/19 0800  BP: 119/65 (!) 114/56 (!) 106/57   Pulse: 61     Resp: (!) 24     Temp:  99.4 F (37.4 C) 98.3 F (36.8 C) 98.3 F (36.8 C)  TempSrc:  Oral Oral Oral  SpO2: 93% 95%  93%  Weight:      Height:        Intake/Output Summary (Last 24 hours) at 01/03/2019 1224 Last data filed at 01/03/2019 0819 Gross per 24  hour  Intake 1350 ml  Output 1700 ml  Net -350 ml   Filed Weights   01/02/19 1056  Weight: (!) 155.6 kg    Examination:  Constitutional: NAD Eyes: PERRL, lids and conjunctivae normal ENMT: Mucous membranes are moist. No oropharyngeal exudates Neck: normal, supple Respiratory: clear to auscultation bilaterally, no wheezing, no crackles. Normal respiratory effort.  Cardiovascular: Regular rate  and rhythm, no murmurs / rubs / gallops.  1+ lower extremity edema, chronic venous stasis changes bilaterally Abdomen: no tenderness. Bowel sounds positive.  Musculoskeletal: no clubbing / cyanosis.  Skin: no rashes Neurologic: CN 2-12 grossly intact. Strength 5/5 in all 4.  Psychiatric: Normal judgment and insight. Alert and oriented x 3. Normal mood.    Data Reviewed: I have independently reviewed following labs and imaging studies   CBC: Recent Labs  Lab 01/02/19 1045 01/03/19 0500  WBC 3.1* 1.9*  NEUTROABS 2.2 1.4*  HGB 11.9* 11.4*  HCT 37.7 36.2  MCV 68.7* 69.1*  PLT 120* 956*   Basic Metabolic Panel: Recent Labs  Lab 01/02/19 1045 01/03/19 0233  NA 136 140  K 2.9* 3.6  CL 94* 97*  CO2 29 27  GLUCOSE 105* 158*  BUN 55* 52*  CREATININE 2.31* 1.73*  CALCIUM 7.4* 7.3*  MG 1.4* 1.5*  PHOS  --  2.9   GFR: Estimated Creatinine Clearance: 49.4 mL/min (A) (by C-G formula based on SCr of 1.73 mg/dL (H)). Liver Function Tests: Recent Labs  Lab 01/02/19 1045 01/03/19 0233  AST 34 34  ALT 14 15  ALKPHOS 121 105  BILITOT 0.7 0.5  PROT 7.1 6.5  ALBUMIN 3.0* 2.8*   No results for input(s): LIPASE, AMYLASE in the last 168 hours. No results for input(s): AMMONIA in the last 168 hours. Coagulation Profile: No results for input(s): INR, PROTIME in the last 168 hours. Cardiac Enzymes: No results for input(s): CKTOTAL, CKMB, CKMBINDEX, TROPONINI in the last 168 hours. BNP (last 3 results) No results for input(s): PROBNP in the last 8760 hours. HbA1C: No results for input(s): HGBA1C in the last 72 hours. CBG: No results for input(s): GLUCAP in the last 168 hours. Lipid Profile: No results for input(s): CHOL, HDL, LDLCALC, TRIG, CHOLHDL, LDLDIRECT in the last 72 hours. Thyroid Function Tests: No results for input(s): TSH, T4TOTAL, FREET4, T3FREE, THYROIDAB in the last 72 hours. Anemia Panel: Recent Labs    01/02/19 1531 01/03/19 0233  FERRITIN 132 149   Urine  analysis:    Component Value Date/Time   COLORURINE YELLOW 01/02/2019 1430   APPEARANCEUR CLEAR 01/02/2019 1430   LABSPEC 1.013 01/02/2019 1430   PHURINE 6.0 01/02/2019 1430   GLUCOSEU NEGATIVE 01/02/2019 1430   HGBUR NEGATIVE 01/02/2019 1430   BILIRUBINUR NEGATIVE 01/02/2019 1430   BILIRUBINUR neg 09/01/2018 1121   KETONESUR NEGATIVE 01/02/2019 1430   PROTEINUR NEGATIVE 01/02/2019 1430   UROBILINOGEN 0.2 09/01/2018 1121   UROBILINOGEN 0.2 09/24/2017 1143   NITRITE NEGATIVE 01/02/2019 1430   LEUKOCYTESUR NEGATIVE 01/02/2019 1430   Sepsis Labs: Invalid input(s): PROCALCITONIN, LACTICIDVEN  Recent Results (from the past 240 hour(s))  Gastrointestinal Panel by PCR , Stool     Status: None   Collection Time: 01/02/19 10:30 AM   Specimen: Stool  Result Value Ref Range Status   Campylobacter species NOT DETECTED NOT DETECTED Final   Plesimonas shigelloides NOT DETECTED NOT DETECTED Final   Salmonella species NOT DETECTED NOT DETECTED Final   Yersinia enterocolitica NOT DETECTED NOT DETECTED Final   Vibrio species NOT  DETECTED NOT DETECTED Final   Vibrio cholerae NOT DETECTED NOT DETECTED Final   Enteroaggregative E coli (EAEC) NOT DETECTED NOT DETECTED Final   Enteropathogenic E coli (EPEC) NOT DETECTED NOT DETECTED Final   Enterotoxigenic E coli (ETEC) NOT DETECTED NOT DETECTED Final   Shiga like toxin producing E coli (STEC) NOT DETECTED NOT DETECTED Final   Shigella/Enteroinvasive E coli (EIEC) NOT DETECTED NOT DETECTED Final   Cryptosporidium NOT DETECTED NOT DETECTED Final   Cyclospora cayetanensis NOT DETECTED NOT DETECTED Final   Entamoeba histolytica NOT DETECTED NOT DETECTED Final   Giardia lamblia NOT DETECTED NOT DETECTED Final   Adenovirus F40/41 NOT DETECTED NOT DETECTED Final   Astrovirus NOT DETECTED NOT DETECTED Final   Norovirus GI/GII NOT DETECTED NOT DETECTED Final   Rotavirus A NOT DETECTED NOT DETECTED Final   Sapovirus (I, II, IV, and V) NOT DETECTED NOT  DETECTED Final    Comment: Performed at [facility], [address] [place], [place] [address]  SARS Coronavirus 2     Status: Abnormal   Collection Time: 01/02/19 12:28 PM  Result Value Ref Range Status   SARS Coronavirus 2 DETECTED (A) NOT DETECTED Final    Comment: CRITICAL RESULT CALLED TO, READ BACK BY AND VERIFIED WITH: DR Lorin Mercy, J. 1452 FCP (NOTE) SARS-CoV-2 target nucleic acids are DETECTED. The SARS-CoV-2 RNA is generally detectable in upper and lower respiratory specimens during the acute phase of infection. Positive results are indicative of active infection with SARS-CoV-2. Clinical  correlation with patient history and other diagnostic information is necessary to determine patient infection status. Positive results do  not rule out bacterial infection or co-infection with other viruses. The expected result is Not Detected. Fact Sheet for Patients: http://www.biofiredefense.com/wp-content/uploads/2020/03/BIOFIRE-COVID -19-patients.pdf Fact Sheet for Healthcare Providers: http://www.biofiredefense.com/wp-content/uploads/2020/03/BIOFIRE-COVID -19-hcp.pdf This test is not yet approved or cleared by the [place] and  has been authorized for detection and/or diagnosis of SARS-CoV-2 by FDA under an Emergency Use Aut horization (EUA).  This EUA will remain in effect (meaning this test can be used) for the duration of  the COVID-19 declaration under Section 564(b)(1) of the Act, 21 U.S.C. section [phone] 3(b)(1), unless the authorization is terminated or revoked sooner. Performed at [facility], [place] [address] [place], [place] [address]       Radiology Studies: Dg Chest Port 1 View  Result Date: 01/02/2019 CLINICAL DATA:  Chills and body aches since Thursday. EXAM: PORTABLE CHEST 1 VIEW COMPARISON:  09/13/2018 FINDINGS: The heart is mildly enlarged but stable. A loop recorder is again noted. The mediastinal and hilar contours appear stable.  Stable right mid lung scarring changes. Peribronchial thickening, increased interstitial markings and streaky areas of atelectasis. Findings could be due to severe bronchitis or atypical interstitial pneumonitis. No focal airspace consolidation. No pleural effusions.  The bony thorax is intact. IMPRESSION: 1. Severe bronchitis versus interstitial pneumonitis. 2. Streaky areas of atelectasis but no definite areas of focal airspace consolidation. Electronically Signed   By: Marijo Sanes M.D.   On: 01/02/2019 18:44     Marzetta Board, MD, PhD [facility]  Contact via  [email or web address]  [place] P: [phone]  F: [phone]

## 2019-01-04 ENCOUNTER — Telehealth: Payer: Self-pay

## 2019-01-04 DIAGNOSIS — I10 Essential (primary) hypertension: Secondary | ICD-10-CM

## 2019-01-04 LAB — COMPREHENSIVE METABOLIC PANEL
ALT: 14 U/L (ref 0–44)
AST: 31 U/L (ref 15–41)
Albumin: 2.7 g/dL — ABNORMAL LOW (ref 3.5–5.0)
Alkaline Phosphatase: 103 U/L (ref 38–126)
Anion gap: 12 (ref 5–15)
BUN: 56 mg/dL — ABNORMAL HIGH (ref 8–23)
CO2: 28 mmol/L (ref 22–32)
Calcium: 7.6 mg/dL — ABNORMAL LOW (ref 8.9–10.3)
Chloride: 99 mmol/L (ref 98–111)
Creatinine, Ser: 1.66 mg/dL — ABNORMAL HIGH (ref 0.44–1.00)
GFR calc Af Amer: 37 mL/min — ABNORMAL LOW (ref >60)
GFR calc non Af Amer: 32 mL/min — ABNORMAL LOW (ref >60)
Glucose, Bld: 213 mg/dL — ABNORMAL HIGH (ref 70–99)
Potassium: 3.7 mmol/L (ref 3.5–5.1)
Sodium: 139 mmol/L (ref 135–145)
Total Bilirubin: 0.4 mg/dL (ref 0.3–1.2)
Total Protein: 6.6 g/dL (ref 6.5–8.1)

## 2019-01-04 LAB — CBC WITH DIFFERENTIAL/PLATELET
Abs Immature Granulocytes: 0.02 10*3/uL (ref 0.00–0.07)
Basophils Absolute: 0 10*3/uL (ref 0.0–0.1)
Basophils Relative: 0 %
Eosinophils Absolute: 0 10*3/uL (ref 0.0–0.5)
Eosinophils Relative: 0 %
HCT: 37.3 % (ref 36.0–46.0)
Hemoglobin: 11.4 g/dL — ABNORMAL LOW (ref 12.0–15.0)
Immature Granulocytes: 1 %
Lymphocytes Relative: 15 %
Lymphs Abs: 0.4 10*3/uL — ABNORMAL LOW (ref 0.7–4.0)
MCH: 21.2 pg — ABNORMAL LOW (ref 26.0–34.0)
MCHC: 30.6 g/dL (ref 30.0–36.0)
MCV: 69.2 fL — ABNORMAL LOW (ref 80.0–100.0)
Monocytes Absolute: 0.1 10*3/uL (ref 0.1–1.0)
Monocytes Relative: 3 %
Neutro Abs: 2.4 10*3/uL (ref 1.7–7.7)
Neutrophils Relative %: 81 %
Platelets: 123 10*3/uL — ABNORMAL LOW (ref 150–400)
RBC: 5.39 MIL/uL — ABNORMAL HIGH (ref 3.87–5.11)
RDW: 15.2 % (ref 11.5–15.5)
WBC: 3 10*3/uL — ABNORMAL LOW (ref 4.0–10.5)
nRBC: 0 % (ref 0.0–0.2)

## 2019-01-04 LAB — C-REACTIVE PROTEIN: CRP: 2.5 mg/dL — ABNORMAL HIGH (ref <1.0)

## 2019-01-04 LAB — MAGNESIUM: Magnesium: 1.8 mg/dL (ref 1.7–2.4)

## 2019-01-04 LAB — FERRITIN: Ferritin: 147 ng/mL (ref 11–307)

## 2019-01-04 LAB — PHOSPHORUS: Phosphorus: 3.2 mg/dL (ref 2.5–4.6)

## 2019-01-04 LAB — FIBRINOGEN: Fibrinogen: 561 mg/dL — ABNORMAL HIGH (ref 210–475)

## 2019-01-04 LAB — D-DIMER, QUANTITATIVE: D-Dimer, Quant: 2.29 ug/mL-FEU — ABNORMAL HIGH (ref 0.00–0.50)

## 2019-01-04 MED ORDER — FUROSEMIDE 20 MG PO TABS
40.0000 mg | ORAL_TABLET | Freq: Two times a day (BID) | ORAL | Status: DC
  Administered 2019-01-04 – 2019-01-05 (×2): 40 mg via ORAL
  Filled 2019-01-04 (×2): qty 2

## 2019-01-04 NOTE — Telephone Encounter (Signed)
I called pts son Orlandis that his mom appt will be cancel because she is in the hospital and pt test positive for COVID 19. I recommend the son call back when pt is well for a video visit and stable. I stated appt will be cancel on Monday. The son verbalized understanding.

## 2019-01-04 NOTE — Progress Notes (Signed)
[facility] PROGRESS NOTE    Progress Note  [employer] Null  RWE:[id number] DOB: [birth year] DOA: 01/02/2019 PCP: Lanae Boast, FNP     Brief Narrative:   Pamela [name] is an 68 y.o. 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 C. difficile PCR is negative, viral GI panel is pending. She was treated conservatively with IV fluids and Zofran. She relates she is willing to try diet today.  Acute kidney injury on chronic kidney disease stage III: Likely hemodynamically mediated in the setting of Lasix use. She will start on IV fluid hydration and her creatinine improved to baseline.  COPD: Continue inhalers.  Essential hypertension Stable continue current medication.  Chronic diastolic heart failure:   DVT prophylaxis: lovexno Family Communication: none Disposition Plan/Barrier to D/C: home hopefully tmrw Code Status:     Code Status Orders  (From admission, onward)         Start     Ordered   01/02/19 1527  Full code  Continuous     01/02/19 1531        Code Status History    Date Active Date Inactive Code Status Order ID Comments User Context   09/06/2018 1101 09/09/2018 2103 Full Code [id number]  Reubin Milan, MD ED   09/06/2018 1101 09/06/2018 1101 Full Code [id number]  Reubin Milan, MD ED   06/12/2014 0047 06/15/2014 1536 Full Code [id number]  Waldemar Dickens, MD ED   05/24/2014 0546 05/28/2014 1750 Full Code [id number]  Ivor Costa, MD Inpatient   Advance Care Planning Activity        IV Access:    Peripheral IV   Procedures and  diagnostic studies:   Dg Chest Port 1 View  Result Date: 01/02/2019 CLINICAL DATA:  Chills and body aches since Thursday. EXAM: PORTABLE CHEST 1 VIEW COMPARISON:  09/13/2018 FINDINGS: The heart is mildly enlarged but stable. A loop recorder is again noted. The mediastinal and hilar contours appear stable. Stable right mid lung scarring changes. Peribronchial thickening, increased interstitial markings and streaky areas of atelectasis. Findings could be due to severe bronchitis or atypical interstitial pneumonitis. No focal airspace consolidation. No pleural effusions.  The bony thorax is intact. IMPRESSION: 1. Severe bronchitis versus interstitial pneumonitis. 2. Streaky areas of atelectasis but no definite areas of focal airspace consolidation. Electronically Signed   By: Marijo Sanes M.D.   On: 01/02/2019 18:44     Medical Consultants:    None.  Anti-Infectives:    Subjective:    Pamela [name] she relates her nausea is resolved as well as her diarrhea.    Objective:    Vitals:   01/03/19 1651 01/03/19 2003 01/03/19 2005 01/04/19 0324  BP: (!) 103/56  (!) 117/50 (!) 121/94  Pulse:  (!) 54 (!) 56 (!) 53  Resp:   18 (!) 21  Temp: 98.5 F (36.9 C) 98.7 F (37.1 C)  98.2 F (36.8 C)  TempSrc: Oral Oral  Oral  SpO2:   93% 92%  Weight:      Height:        Intake/Output Summary (Last 24 hours) at 01/04/2019 7902 Last data  filed at 01/04/2019 0327 Gross per 24 hour  Intake -  Output 1400 ml  Net -1400 ml   Filed Weights   01/02/19 1056  Weight: (!) 155.6 kg    Exam: General exam: In no acute distress. Respiratory system: Good air movement and clear to auscultation. Cardiovascular system: S1 & S2 heard, RRR. No JVD. Gastrointestinal system: Abdomen is nondistended, soft and nontender.  Central nervous system: Alert and oriented. No focal neurological deficits. Extremities: No pedal edema. Skin: No rashes, lesions or ulcers Psychiatry: Judgement and insight appear  normal. Mood & affect appropriate.     Data Reviewed:    Labs: Basic Metabolic Panel: Recent Labs  Lab 01/02/19 1045 01/03/19 0233 01/04/19 0300  NA 136 140 139  K 2.9* 3.6 3.7  CL 94* 97* 99  CO2 29 27 28   GLUCOSE 105* 158* 213*  BUN 55* 52* 56*  CREATININE 2.31* 1.73* 1.66*  CALCIUM 7.4* 7.3* 7.6*  MG 1.4* 1.5* 1.8  PHOS  --  2.9 3.2   GFR Estimated Creatinine Clearance: 51.5 mL/min (A) (by C-G formula based on SCr of 1.66 mg/dL (H)). Liver Function Tests: Recent Labs  Lab 01/02/19 1045 01/03/19 0233 01/04/19 0300  AST 34 34 31  ALT 14 15 14   ALKPHOS 121 105 103  BILITOT 0.7 0.5 0.4  PROT 7.1 6.5 6.6  ALBUMIN 3.0* 2.8* 2.7*   No results for input(s): LIPASE, AMYLASE in the last 168 hours. No results for input(s): AMMONIA in the last 168 hours. Coagulation profile No results for input(s): INR, PROTIME in the last 168 hours. COVID-19 Labs  Recent Labs    01/02/19 1531 01/03/19 0233 01/03/19 0900 01/04/19 0300  DDIMER 1.83*  --  2.44* 2.29*  FERRITIN 132 149  --  147  LDH 156 173  --   --   CRP 3.8* 4.3*  --  2.5*    Lab Results  Component Value Date   SARSCOV2NAA DETECTED (A) 01/02/2019    CBC: Recent Labs  Lab 01/02/19 1045 01/03/19 0500 01/04/19 0300  WBC 3.1* 1.9* 3.0*  NEUTROABS 2.2 1.4* 2.4  HGB 11.9* 11.4* 11.4*  HCT 37.7 36.2 37.3  MCV 68.7* 69.1* 69.2*  PLT 120* 105* 123*   Cardiac Enzymes: No results for input(s): CKTOTAL, CKMB, CKMBINDEX, TROPONINI in the last 168 hours. BNP (last 3 results) No results for input(s): PROBNP in the last 8760 hours. CBG: No results for input(s): GLUCAP in the last 168 hours. D-Dimer: Recent Labs    01/03/19 0900 01/04/19 0300  DDIMER 2.44* 2.29*   Hgb A1c: No results for input(s): HGBA1C in the last 72 hours. Lipid Profile: No results for input(s): CHOL, HDL, LDLCALC, TRIG, CHOLHDL, LDLDIRECT in the last 72 hours. Thyroid function studies: No results for input(s): TSH, T4TOTAL,  T3FREE, THYROIDAB in the last 72 hours.  Invalid input(s): FREET3 Anemia work up: Recent Labs    01/03/19 0233 01/04/19 0300  FERRITIN 149 147   Sepsis Labs: Recent Labs  Lab 01/02/19 1045 01/02/19 1531 01/03/19 0500 01/04/19 0300  PROCALCITON  --  <0.10  --   --   WBC 3.1*  --  1.9* 3.0*   Microbiology Recent Results (from the past 240 hour(s))  Gastrointestinal Panel by PCR , Stool     Status: None   Collection Time: 01/02/19 10:30 AM   Specimen: Stool  Result Value Ref Range Status   Campylobacter species NOT DETECTED NOT DETECTED Final   Plesimonas shigelloides NOT DETECTED NOT DETECTED Final  Salmonella species NOT DETECTED NOT DETECTED Final   Yersinia enterocolitica NOT DETECTED NOT DETECTED Final   Vibrio species NOT DETECTED NOT DETECTED Final   Vibrio cholerae NOT DETECTED NOT DETECTED Final   Enteroaggregative E coli (EAEC) NOT DETECTED NOT DETECTED Final   Enteropathogenic E coli (EPEC) NOT DETECTED NOT DETECTED Final   Enterotoxigenic E coli (ETEC) NOT DETECTED NOT DETECTED Final   Shiga like toxin producing E coli (STEC) NOT DETECTED NOT DETECTED Final   Shigella/Enteroinvasive E coli (EIEC) NOT DETECTED NOT DETECTED Final   Cryptosporidium NOT DETECTED NOT DETECTED Final   Cyclospora cayetanensis NOT DETECTED NOT DETECTED Final   Entamoeba histolytica NOT DETECTED NOT DETECTED Final   Giardia lamblia NOT DETECTED NOT DETECTED Final   Adenovirus F40/41 NOT DETECTED NOT DETECTED Final   Astrovirus NOT DETECTED NOT DETECTED Final   Norovirus GI/GII NOT DETECTED NOT DETECTED Final   Rotavirus A NOT DETECTED NOT DETECTED Final   Sapovirus (I, II, IV, and V) NOT DETECTED NOT DETECTED Final    Comment: Performed at [facility], [address] [place], [place] [address]  SARS Coronavirus 2     Status: Abnormal   Collection Time: 01/02/19 12:28 PM  Result Value Ref Range Status   SARS Coronavirus 2 DETECTED (A) NOT DETECTED Final    Comment:  CRITICAL RESULT CALLED TO, READ BACK BY AND VERIFIED WITH: DR Lorin Mercy, J. 1452 FCP (NOTE) SARS-CoV-2 target nucleic acids are DETECTED. The SARS-CoV-2 RNA is generally detectable in upper and lower respiratory specimens during the acute phase of infection. Positive results are indicative of active infection with SARS-CoV-2. Clinical  correlation with patient history and other diagnostic information is necessary to determine patient infection status. Positive results do  not rule out bacterial infection or co-infection with other viruses. The expected result is Not Detected. Fact Sheet for Patients: http://www.biofiredefense.com/wp-content/uploads/2020/03/BIOFIRE-COVID -19-patients.pdf Fact Sheet for Healthcare Providers: http://www.biofiredefense.com/wp-content/uploads/2020/03/BIOFIRE-COVID -19-hcp.pdf This test is not yet approved or cleared by the [place] and  has been authorized for detection and/or diagnosis of SARS-CoV-2 by FDA under an Emergency Use Aut horization (EUA).  This EUA will remain in effect (meaning this test can be used) for the duration of  the COVID-19 declaration under Section 564(b)(1) of the Act, 21 U.S.C. section 279-304-2010 3(b)(1), unless the authorization is terminated or revoked sooner. Performed at [facility], [place] [address] [place], [place] [address]   C difficile quick scan w PCR reflex     Status: None   Collection Time: 01/03/19  8:50 AM   Specimen: STOOL  Result Value Ref Range Status   C Diff antigen NEGATIVE NEGATIVE Final   C Diff toxin NEGATIVE NEGATIVE Final   C Diff interpretation No C. difficile detected.  Final    Comment: Performed at [facility], [place] [address], [place], [place] [address]     Medications:   . aspirin  325 mg Oral Daily  . atorvastatin  40 mg Oral q1800  . calcitRIOL  0.25 mcg Oral Daily  . docusate sodium  100 mg Oral BID  . enoxaparin (LOVENOX) injection  80 mg Subcutaneous Q24H   . febuxostat  40 mg Oral Daily  . methylPREDNISolone (SOLU-MEDROL) injection  80 mg Intravenous Q12H  . mometasone-formoterol  2 puff Inhalation BID  . montelukast  10 mg Oral QHS   Continuous Infusions:    LOS: 2 days   Pamela [name]  [facility]  01/04/2019, 8:11 AM

## 2019-01-05 LAB — CBC WITH DIFFERENTIAL/PLATELET
Abs Immature Granulocytes: 0.08 10*3/uL — ABNORMAL HIGH (ref 0.00–0.07)
Basophils Absolute: 0 10*3/uL (ref 0.0–0.1)
Basophils Relative: 0 %
Eosinophils Absolute: 0.1 10*3/uL (ref 0.0–0.5)
Eosinophils Relative: 1 %
HCT: 39.4 % (ref 36.0–46.0)
Hemoglobin: 12.5 g/dL (ref 12.0–15.0)
Immature Granulocytes: 1 %
Lymphocytes Relative: 9 %
Lymphs Abs: 0.5 10*3/uL — ABNORMAL LOW (ref 0.7–4.0)
MCH: 22 pg — ABNORMAL LOW (ref 26.0–34.0)
MCHC: 31.7 g/dL (ref 30.0–36.0)
MCV: 69.4 fL — ABNORMAL LOW (ref 80.0–100.0)
Monocytes Absolute: 0.3 10*3/uL (ref 0.1–1.0)
Monocytes Relative: 6 %
Neutro Abs: 4.7 10*3/uL (ref 1.7–7.7)
Neutrophils Relative %: 83 %
Platelets: 142 10*3/uL — ABNORMAL LOW (ref 150–400)
RBC: 5.68 MIL/uL — ABNORMAL HIGH (ref 3.87–5.11)
RDW: 15.8 % — ABNORMAL HIGH (ref 11.5–15.5)
WBC: 5.6 10*3/uL (ref 4.0–10.5)
nRBC: 0 % (ref 0.0–0.2)

## 2019-01-05 LAB — COMPREHENSIVE METABOLIC PANEL
ALT: 14 U/L (ref 0–44)
AST: 28 U/L (ref 15–41)
Albumin: 2.8 g/dL — ABNORMAL LOW (ref 3.5–5.0)
Alkaline Phosphatase: 108 U/L (ref 38–126)
Anion gap: 15 (ref 5–15)
BUN: 57 mg/dL — ABNORMAL HIGH (ref 8–23)
CO2: 24 mmol/L (ref 22–32)
Calcium: 7.8 mg/dL — ABNORMAL LOW (ref 8.9–10.3)
Chloride: 99 mmol/L (ref 98–111)
Creatinine, Ser: 1.6 mg/dL — ABNORMAL HIGH (ref 0.44–1.00)
GFR calc Af Amer: 39 mL/min — ABNORMAL LOW (ref >60)
GFR calc non Af Amer: 33 mL/min — ABNORMAL LOW (ref >60)
Glucose, Bld: 168 mg/dL — ABNORMAL HIGH (ref 70–99)
Potassium: 4.4 mmol/L (ref 3.5–5.1)
Sodium: 138 mmol/L (ref 135–145)
Total Bilirubin: 0.2 mg/dL — ABNORMAL LOW (ref 0.3–1.2)
Total Protein: 6.7 g/dL (ref 6.5–8.1)

## 2019-01-05 LAB — PHOSPHORUS: Phosphorus: 2.4 mg/dL — ABNORMAL LOW (ref 2.5–4.6)

## 2019-01-05 LAB — C-REACTIVE PROTEIN: CRP: 1.2 mg/dL — ABNORMAL HIGH (ref <1.0)

## 2019-01-05 LAB — D-DIMER, QUANTITATIVE: D-Dimer, Quant: 1.23 ug/mL-FEU — ABNORMAL HIGH (ref 0.00–0.50)

## 2019-01-05 LAB — MAGNESIUM: Magnesium: 1.8 mg/dL (ref 1.7–2.4)

## 2019-01-05 LAB — FERRITIN: Ferritin: 167 ng/mL (ref 11–307)

## 2019-01-05 LAB — FIBRINOGEN: Fibrinogen: 603 mg/dL — ABNORMAL HIGH (ref 210–475)

## 2019-01-05 MED ORDER — PREDNISONE 10 MG PO TABS: ORAL_TABLET | ORAL | 0 refills | Status: DC

## 2019-01-05 NOTE — TOC Transition Note (Signed)
Transition of Care [facility]) - CM/SW Discharge Note   Patient Details  Name: Caedyn Tassinari MRN: [id number] Date of Birth: [birth year]  Transition of Care [facility]) CM/SW Contact:  Midge Minium RN, BSN, NCM-BC, ACM-RN [phone] (working remotely) Phone Number: 01/05/2019, 1:40 PM   Clinical Narrative:    CM spoke to the patient via phone to discuss the POC. Patient states living at home with her son and daughter-in-law, who are both COVID +, and being independent with her ADLs, ambulating with a SPC. PCP verified as: Lanae Boast, FNP; Insurance: Medicare [place]. Patient assessed for [facility] needs, and has declined. [facility] screened for appropriateness and will f/u with the patient post discharge. Patient will require PTAR transportation, with PTAR arranged and scheduled for 1500. No further needs from CM.    Final next level of care: Home/Self Care Barriers to Discharge: No Barriers Identified   Patient Goals and CMS Choice Patient states their goals for this hospitalization and ongoing recovery are:: "to continue to get better"   Choice offered to / list presented to : NA   Discharge Plan and Services          DME Arranged: N/A DME Agency: NA   HH Arranged: NA HH Agency: NA  Social Determinants of Health (SDOH) Interventions     Readmission Risk Interventions Readmission Risk Prevention Plan 01/05/2019  Transportation Screening Complete  Medication Review Press photographer) Complete  PCP or Specialist appointment within 3-5 days of discharge Complete  HRI or Home Care Consult Patient refused  SW Recovery Care/Counseling Consult Complete  [place] Not Applicable  Some recent data might be hidden

## 2019-01-05 NOTE — Care Management Important Message (Signed)
Important Message  Patient Details  Name: Pamela [name] MRN: [id number] Date of Birth: 1951/01/09   Medicare Important Message Given:  Yes  Verbal consent obtained due to the current National Emergency. Patient Authorized IM to be mailed by phone.   Carloyn Lahue 01/05/2019, 2:43 PM

## 2019-01-05 NOTE — Care Management Important Message (Signed)
Important Message  Patient Details  Name: Pamela [name] MRN: [id number] Date of Birth: [birth year]   Medicare Important Message Given:  Yes  Verbal consent obtained due to the current National Emergency. Patient Authorized IM to be mailed by phone.   [name] 01/05/2019, 2:52 PM

## 2019-01-05 NOTE — Care Management Important Message (Signed)
Important Message  Patient Details  Name: Pamela [name] MRN: [id number] Date of Birth: August 27, 1950   Medicare Important Message Given:  Yes    Christa Fasig [name] 01/05/2019, 12:36 PM

## 2019-01-05 NOTE — Evaluation (Signed)
Physical Therapy Evaluation Patient Details Name: Pamela [name] MRN: [id number] DOB: 23-Feb-1951 Today's Date: 01/05/2019   History of Present Illness  68 y.o. 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 Ambulated in x 120' with [facility] and supervision, Gait is slow and steady. Patient encouraged to use RW for a few days until improved.. Pt admitted with above diagnosis. Pt currently with functional limitations due to the deficits listed below (see PT Problem List).  Pt will benefit from skilled PT to increase their independence and safety with mobility to allow discharge to the venue listed below.       Follow Up Recommendations No PT follow up(pt declines need)    Equipment Recommendations  None recommended by PT    Recommendations for Other Services       Precautions / Restrictions Precautions Precautions: Fall      Mobility  Bed Mobility               General bed mobility comments: oob  Transfers Overall transfer level: Modified independent Equipment used: Straight cane                Ambulation/Gait Ambulation/Gait assistance: Supervision Gait Distance (Feet): 120 Feet Assistive device: Straight cane Gait Pattern/deviations: Step-to pattern;Decreased stride length Gait velocity: decr   General Gait Details: tends to lateral lean during swing  Stairs            Wheelchair Mobility    Modified Rankin (Stroke Patients Only)       Balance Overall balance assessment: Independent;Needs assistance         Standing balance support: Single extremity supported;During functional activity Standing balance-Leahy Scale: Fair                                Pertinent Vitals/Pain      Home Living Family/patient expects to be discharged to:: Private residence Living Arrangements: Children Available Help at Discharge: Family;Available PRN/intermittently Type of Home: House Home Access: Stairs to enter   Entrance Stairs-Number of Steps: 1 Home Layout: Able to live on main level with bedroom/bathroom;Other (Comment) Home Equipment: Cane - single point;Walker - 2 wheels Additional Comments: son and family live on second llevel    Prior Function Level of Independence: Independent with assistive device(s)               Hand Dominance        Extremity/Trunk Assessment   Upper Extremity Assessment Upper Extremity Assessment: Generalized weakness    Lower Extremity Assessment Lower Extremity Assessment: Generalized weakness    Cervical / Trunk Assessment Cervical / Trunk Assessment: Normal  Communication   Communication: No difficulties  Cognition Arousal/Alertness: Awake/alert Behavior During Therapy: WFL for tasks assessed/performed Overall Cognitive Status: Within Functional Limits for tasks assessed                                        General Comments      Exercises     Assessment/Plan    PT Assessment Patient needs continued PT services  PT Problem List Decreased strength;Decreased activity tolerance;Decreased  mobility;Obesity       PT Treatment Interventions DME instruction;Gait training;Functional mobility training;Therapeutic activities    PT Goals (Current goals can be found in the Care Plan section)  Acute Rehab PT Goals Patient Stated Goal: to go home PT Goal Formulation: With patient Time For Goal Achievement: 01/19/19 Potential to Achieve Goals: Good    Frequency Min 3X/week   Barriers to discharge        Co-evaluation               AM-PAC PT "6 Clicks" Mobility  Outcome Measure Help needed turning from your back to your side while in a flat bed without  using bedrails?: None Help needed moving from lying on your back to sitting on the side of a flat bed without using bedrails?: None Help needed moving to and from a bed to a chair (including a wheelchair)?: None Help needed standing up from a chair using your arms (e.g., wheelchair or bedside chair)?: None Help needed to walk in hospital room?: None Help needed climbing 3-5 steps with a railing? : A Little 6 Click Score: 23    End of Session   Activity Tolerance: Patient tolerated treatment well Patient left: in chair Nurse Communication: Mobility status PT Visit Diagnosis: Unsteadiness on feet (R26.81)    Time: 1050-1106 PT Time Calculation (min) (ACUTE ONLY): 16 min   Charges:   PT Evaluation $PT Eval Low Complexity: [name] Pager [phone] Office [phone]   Claretha Cooper 01/05/2019, 1:41 PM

## 2019-01-05 NOTE — Care Management Important Message (Signed)
Important Message  Patient Details  Name: Pamela [name] MRN: [id number] Date of Birth: Jan 17, 1951   Medicare Important Message Given:  Yes    [name] [name] 01/05/2019, 12:39 PM

## 2019-01-05 NOTE — Consult Note (Addendum)
   [facility] CM Inpatient Consult   01/05/2019  Pamela [name] May 29, 1951 [id number]  1430: Addendum:  Call patient's room and was able to speak with her regarding any [facility] Care Management services for post hospital follow up.  Patient verbally consent for [facility] follow up for chronic disease management.  Patient states she was currently getting her instructions for home.  Patient has declined home health visits. Will follow telephonically.     Patient screened for extreme high risk score for unplanned readmission score of 30% in the Medicare [place] ACO.  Review of patient's medical record reveals patient is 68 year old female with sickle cell trait, sarcoidosis, OSA, COPD on chronic home O2, diastolic CHF came into the hospital with fever, chills, as well as diarrhea.  She also reports loss of taste and mild loss of smell.  Her son has been diagnosed with COVID pneumonia.  She was found to be dehydrated with acute kidney injury and was admitted for IV fluids.  Chest x-ray on admission without infiltrates.  Primary Care Provider is  Lanae Boast, FNP, this office provides the transition of care follow up.  Pharmacy is:  Listed as Paediatric nurse and [facility].  Plan:  Contacted inpatient RNCM regards to patient eligible for services and patient has extreme high risk scores.  Attempted calls into patient's hospital room without success and unable to successfully get a voicemail to her phone number listed as [phone] to reach out regarding services with [facility].  Please place a [facility] Care Management consult as appropriate and for questions contact:   Natividad Brood, RN BSN [facility] Liaison  [phone] business mobile phone Toll free office [phone]  Fax number: [phone] [email or web address]

## 2019-01-05 NOTE — Discharge Summary (Signed)
Physician Discharge Summary  Pamela [name] DTO:[id number] DOB: 1950-10-03 DOA: 01/02/2019  PCP: Lanae Boast, FNP  Admit date: 01/02/2019 Discharge date: 01/05/2019  Admitted From: Home Disposition:  Home  Recommendations for Outpatient Follow-up:  1. Follow up with PCP in 1-2 weeks 2. Please obtain BMP/CBC in one week   Home Health:No  Equipment/Devices:No  Discharge Condition:Stable CODE STATUS:Full Diet recommendation: Heart Healthy  Brief/Interim Summary:  68 y.o. 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 C. difficile PCR was negative. This x-ray was done that showed interstitial filtrates bilaterally. Started on steroids on admission and she will continue steroids as an outpatient. The patient remained off oxygen throughout her hospital stay she did not complain of any respiratory symptoms she was started on steroids on admission she will continue steroid taper as an outpatient. Her appetite return. Her lymphopenia.  Acute kidney injury on chronic kidney disease stage III: Likely hemodynamically mediated in the setting of Lasix use. Diuretics were stopped she was started on IV fluids as it resolved. Having returned  to baseline.  COPD: No changes made to her medication.  Hypertension: No changes made to her medication.  Chronic diastolic heart failure: No changes made to her medication.  Discharge Instructions  Discharge Instructions    Diet - low sodium heart healthy   Complete by: As directed    Increase activity slowly   Complete by: As directed      Allergies as of 01/05/2019      Reactions   Sulfur Rash      Medication List    TAKE these medications   acetaminophen 500 MG tablet Commonly known as: TYLENOL Take 1,000 mg by mouth daily.   albuterol (2.5 MG/3ML) 0.083% nebulizer solution Commonly known as: PROVENTIL Take 3 mLs (2.5 mg total) by nebulization every 6 (six) hours as needed for wheezing or shortness of breath.   albuterol 108 (90 Base) MCG/ACT inhaler Commonly known as: ProAir HFA Inhale 2 puffs into the lungs every 6 (six) hours as needed for wheezing or shortness of breath.   aspirin 325 MG tablet Take 1 tablet (325 mg total) by mouth daily.   atorvastatin 40 MG tablet Commonly known as: LIPITOR Take 1 tablet (40 mg total) by mouth daily at 6 PM.   budesonide-formoterol 160-4.5 MCG/ACT inhaler Commonly known as: Symbicort Inhale 2 puffs into the lungs 2 (two) times daily.   calcitRIOL 0.25 MCG capsule Commonly known as: ROCALTROL Take 1 capsule (0.25 mcg total) by mouth daily.   calcium carbonate 500 MG chewable tablet Commonly known as: TUMS - dosed in mg elemental calcium Chew 1-2 tablets by mouth daily as needed for indigestion (gas).   docusate sodium 100 MG capsule Commonly known as: COLACE Take 1 capsule (100 mg total) by mouth 2 (two) times daily.  febuxostat 40 MG tablet Commonly known as: Uloric Take 1 tablet (40 mg total) by mouth daily.   furosemide 40 MG tablet Commonly known as: LASIX Take 1 tablet (40 mg total) by mouth 2 (two) times daily. For CHF   metolazone 2.5 MG tablet Commonly known as: ZAROXOLYN Take 1 tablet (2.5 mg  total) by mouth 2 (two) times a week. Take on Mondays and Thursdays   montelukast 10 MG tablet Commonly known as: SINGULAIR Take 1 tablet (10 mg total) by mouth at bedtime.   potassium chloride SA 20 MEQ tablet Commonly known as: K-DUR Take 1 tablet (20 mEq total) by mouth daily.   predniSONE 10 MG tablet Commonly known as: DELTASONE Takes 6 tablets for 1 days, then 5 tablets for 1 days, then 4 tablets for 1 days, then 3 tablets for 1 days, then 2 tabs for 1 days, then 1 tab for 1 days, and then stop.       Allergies  Allergen Reactions  . Sulfur Rash    Consultations:  None   Procedures/Studies: Dg Chest Port 1 View  Result Date: 01/02/2019 CLINICAL DATA:  Chills and body aches since Thursday. EXAM: PORTABLE CHEST 1 VIEW COMPARISON:  09/13/2018 FINDINGS: The heart is mildly enlarged but stable. A loop recorder is again noted. The mediastinal and hilar contours appear stable. Stable right mid lung scarring changes. Peribronchial thickening, increased interstitial markings and streaky areas of atelectasis. Findings could be due to severe bronchitis or atypical interstitial pneumonitis. No focal airspace consolidation. No pleural effusions.  The bony thorax is intact. IMPRESSION: 1. Severe bronchitis versus interstitial pneumonitis. 2. Streaky areas of atelectasis but no definite areas of focal airspace consolidation. Electronically Signed   By: Marijo Sanes M.D.   On: 01/02/2019 18:44     Subjective: No complaints feels great tolerating her diet.  Discharge Exam: Vitals:   01/05/19 0447 01/05/19 0750  BP: 137/83 (!) 177/86  Pulse: (!) 54 (!) 58  Resp:  19  Temp: 98.2 F (36.8 C) 97.9 F (36.6 C)  SpO2: 96% 96%     General: Pt is alert, awake, not in acute distress Cardiovascular: RRR, S1/S2 +, no rubs, no gallops Respiratory: CTA bilaterally, no wheezing, no rhonchi Abdominal: Soft, NT, ND, bowel sounds + Extremities: no edema, no cyanosis    The results of  significant diagnostics from this hospitalization (including imaging, microbiology, ancillary and laboratory) are listed below for reference.     Microbiology: Recent Results (from the past 240 hour(s))  Gastrointestinal Panel by PCR , Stool     Status: None   Collection Time: 01/02/19 10:30 AM   Specimen: Stool  Result Value Ref Range Status   Campylobacter species NOT DETECTED NOT DETECTED Final   Plesimonas shigelloides NOT DETECTED NOT DETECTED Final   Salmonella species NOT DETECTED NOT DETECTED Final   Yersinia enterocolitica NOT DETECTED NOT DETECTED Final   Vibrio species NOT DETECTED NOT DETECTED Final   Vibrio cholerae NOT DETECTED NOT DETECTED Final   Enteroaggregative E coli (EAEC) NOT DETECTED NOT DETECTED Final   Enteropathogenic E coli (EPEC) NOT DETECTED NOT DETECTED Final   Enterotoxigenic E coli (ETEC) NOT DETECTED NOT DETECTED Final   Shiga like toxin producing E coli (STEC) NOT DETECTED NOT DETECTED Final   Shigella/Enteroinvasive E coli (EIEC) NOT DETECTED NOT DETECTED Final   Cryptosporidium NOT DETECTED NOT DETECTED Final   Cyclospora cayetanensis NOT DETECTED NOT DETECTED Final   Entamoeba histolytica NOT DETECTED NOT DETECTED Final  Giardia lamblia NOT DETECTED NOT DETECTED Final   Adenovirus F40/41 NOT DETECTED NOT DETECTED Final   Astrovirus NOT DETECTED NOT DETECTED Final   Norovirus GI/GII NOT DETECTED NOT DETECTED Final   Rotavirus A NOT DETECTED NOT DETECTED Final   Sapovirus (I, II, IV, and V) NOT DETECTED NOT DETECTED Final    Comment: Performed at [facility], [address] [place], [place] [address]  SARS Coronavirus 2     Status: Abnormal   Collection Time: 01/02/19 12:28 PM  Result Value Ref Range Status   SARS Coronavirus 2 DETECTED (A) NOT DETECTED Final    Comment: CRITICAL RESULT CALLED TO, READ BACK BY AND VERIFIED WITH: DR Lorin Mercy, J. 1452 FCP (NOTE) SARS-CoV-2 target nucleic acids are DETECTED. The SARS-CoV-2 RNA is  generally detectable in upper and lower respiratory specimens during the acute phase of infection. Positive results are indicative of active infection with SARS-CoV-2. Clinical  correlation with patient history and other diagnostic information is necessary to determine patient infection status. Positive results do  not rule out bacterial infection or co-infection with other viruses. The expected result is Not Detected. Fact Sheet for Patients: http://www.biofiredefense.com/wp-content/uploads/2020/03/BIOFIRE-COVID -19-patients.pdf Fact Sheet for Healthcare Providers: http://www.biofiredefense.com/wp-content/uploads/2020/03/BIOFIRE-COVID -19-hcp.pdf This test is not yet approved or cleared by the [place] and  has been authorized for detection and/or diagnosis of SARS-CoV-2 by FDA under an Emergency Use Aut horization (EUA).  This EUA will remain in effect (meaning this test can be used) for the duration of  the COVID-19 declaration under Section 564(b)(1) of the Act, 21 U.S.C. section [phone] 3(b)(1), unless the authorization is terminated or revoked sooner. Performed at [facility], [place] [address] [place], [place] [address]   C difficile quick scan w PCR reflex     Status: None   Collection Time: 01/03/19  8:50 AM   Specimen: STOOL  Result Value Ref Range Status   C Diff antigen NEGATIVE NEGATIVE Final   C Diff toxin NEGATIVE NEGATIVE Final   C Diff interpretation No C. difficile detected.  Final    Comment: Performed at [facility], [place] [address], [place],  [address]     Labs: BNP (last 3 results) Recent Labs    09/01/18 1250 09/13/18 0407 01/02/19 1045  BNP 65.8 21.6 23.7   Basic Metabolic Panel: Recent Labs  Lab 01/02/19 1045 01/03/19 0233 01/04/19 0300 01/05/19 0400  NA 136 140 139 138  K 2.9* 3.6 3.7 4.4  CL 94* 97* 99 99  CO2 29 27 28 24   GLUCOSE 105* 158* 213* 168*  BUN 55* 52* 56* 57*  CREATININE 2.31* 1.73*  1.66* 1.60*  CALCIUM 7.4* 7.3* 7.6* 7.8*  MG 1.4* 1.5* 1.8 1.8  PHOS  --  2.9 3.2 2.4*   Liver Function Tests: Recent Labs  Lab 01/02/19 1045 01/03/19 0233 01/04/19 0300 01/05/19 0400  AST 34 34 31 28  ALT 14 15 14 14   ALKPHOS 121 105 103 108  BILITOT 0.7 0.5 0.4 0.2*  PROT 7.1 6.5 6.6 6.7  ALBUMIN 3.0* 2.8* 2.7* 2.8*   No results for input(s): LIPASE, AMYLASE in the last 168 hours. No results for input(s): AMMONIA in the last 168 hours. CBC: Recent Labs  Lab 01/02/19 1045 01/03/19 0500 01/04/19 0300 01/05/19 0400  WBC 3.1* 1.9* 3.0* 5.6  NEUTROABS 2.2 1.4* 2.4 4.7  HGB 11.9* 11.4* 11.4* 12.5  HCT 37.7 36.2 37.3 39.4  MCV 68.7* 69.1* 69.2* 69.4*  PLT 120* 105* 123*  142*   Cardiac Enzymes: No results for input(s): CKTOTAL, CKMB, CKMBINDEX, TROPONINI in the last 168 hours. BNP: Invalid input(s): POCBNP CBG: No results for input(s): GLUCAP in the last 168 hours. D-Dimer Recent Labs    01/03/19 0900 01/04/19 0300  DDIMER 2.44* 2.29*   Hgb A1c No results for input(s): HGBA1C in the last 72 hours. Lipid Profile No results for input(s): CHOL, HDL, LDLCALC, TRIG, CHOLHDL, LDLDIRECT in the last 72 hours. Thyroid function studies No results for input(s): TSH, T4TOTAL, T3FREE, THYROIDAB in the last 72 hours.  Invalid input(s): FREET3 Anemia work up Recent Labs    01/04/19 0300 01/05/19 0400  FERRITIN 147 167   Urinalysis    Component Value Date/Time   COLORURINE YELLOW 01/02/2019 1430   APPEARANCEUR CLEAR 01/02/2019 1430   LABSPEC 1.013 01/02/2019 1430   PHURINE 6.0 01/02/2019 1430   GLUCOSEU NEGATIVE 01/02/2019 1430   HGBUR NEGATIVE 01/02/2019 1430   BILIRUBINUR NEGATIVE 01/02/2019 1430   BILIRUBINUR neg 09/01/2018 1121   KETONESUR NEGATIVE 01/02/2019 1430   PROTEINUR NEGATIVE 01/02/2019 1430   UROBILINOGEN 0.2 09/01/2018 1121   UROBILINOGEN 0.2 09/24/2017 1143   NITRITE NEGATIVE 01/02/2019 1430   LEUKOCYTESUR NEGATIVE 01/02/2019 1430   Sepsis  Labs Invalid input(s): PROCALCITONIN,  WBC,  LACTICIDVEN Microbiology Recent Results (from the past 240 hour(s))  Gastrointestinal Panel by PCR , Stool     Status: None   Collection Time: 01/02/19 10:30 AM   Specimen: Stool  Result Value Ref Range Status   Campylobacter species NOT DETECTED NOT DETECTED Final   Plesimonas shigelloides NOT DETECTED NOT DETECTED Final   Salmonella species NOT DETECTED NOT DETECTED Final   Yersinia enterocolitica NOT DETECTED NOT DETECTED Final   Vibrio species NOT DETECTED NOT DETECTED Final   Vibrio cholerae NOT DETECTED NOT DETECTED Final   Enteroaggregative E coli (EAEC) NOT DETECTED NOT DETECTED Final   Enteropathogenic E coli (EPEC) NOT DETECTED NOT DETECTED Final   Enterotoxigenic E coli (ETEC) NOT DETECTED NOT DETECTED Final   Shiga like toxin producing E coli (STEC) NOT DETECTED NOT DETECTED Final   Shigella/Enteroinvasive E coli (EIEC) NOT DETECTED NOT DETECTED Final   Cryptosporidium NOT DETECTED NOT DETECTED Final   Cyclospora cayetanensis NOT DETECTED NOT DETECTED Final   Entamoeba histolytica NOT DETECTED NOT DETECTED Final   Giardia lamblia NOT DETECTED NOT DETECTED Final   Adenovirus F40/41 NOT DETECTED NOT DETECTED Final   Astrovirus NOT DETECTED NOT DETECTED Final   Norovirus GI/GII NOT DETECTED NOT DETECTED Final   Rotavirus A NOT DETECTED NOT DETECTED Final   Sapovirus (I, II, IV, and V) NOT DETECTED NOT DETECTED Final    Comment: Performed at [facility], [address] [place], [place] [address]  SARS Coronavirus 2     Status: Abnormal   Collection Time: 01/02/19 12:28 PM  Result Value Ref Range Status   SARS Coronavirus 2 DETECTED (A) NOT DETECTED Final    Comment: CRITICAL RESULT CALLED TO, READ BACK BY AND VERIFIED WITH: DR Lorin Mercy, J. 1452 FCP (NOTE) SARS-CoV-2 target nucleic acids are DETECTED. The SARS-CoV-2 RNA is generally detectable in upper and lower respiratory specimens during the acute phase of  infection. Positive results are indicative of active infection with SARS-CoV-2. Clinical  correlation with patient history and other diagnostic information is necessary to determine patient infection status. Positive results do  not rule out bacterial infection or co-infection with other viruses. The expected result is Not Detected. Fact Sheet for Patients: http://www.biofiredefense.com/wp-content/uploads/2020/03/BIOFIRE-COVID -19-patients.pdf Fact Sheet  for Healthcare Providers: http://www.biofiredefense.com/wp-content/uploads/2020/03/BIOFIRE-COVID -19-hcp.pdf This test is not yet approved or cleared by the [place] and  has been authorized for detection and/or diagnosis of SARS-CoV-2 by FDA under an Emergency Use Aut horization (EUA).  This EUA will remain in effect (meaning this test can be used) for the duration of  the COVID-19 declaration under Section 564(b)(1) of the Act, 21 U.S.C. section [phone] 3(b)(1), unless the authorization is terminated or revoked sooner. Performed at [facility], [place] [address], [place], [place] [address]   C difficile quick scan w PCR reflex     Status: None   Collection Time: 01/03/19  8:50 AM   Specimen: STOOL  Result Value Ref Range Status   C Diff antigen NEGATIVE NEGATIVE Final   C Diff toxin NEGATIVE NEGATIVE Final   C Diff interpretation No C. difficile detected.  Final    Comment: Performed at [facility], [place] [address], [place], [place] [address]     Time coordinating discharge: 40 minutes  SIGNED:   Charlynne Cousins, MD  [facility]

## 2019-01-05 NOTE — Care Management Important Message (Signed)
Important Message  Patient Details  Name: Joselle Deeds MRN: [id number] Date of Birth: December 28, 1950   Medicare Important Message Given:  Yes - Important Message mailed due to current National Emergency    Verbal consent obtained due to current National Emergency  Relationship to patient: Self Contact Name: Sussan Meter Call Date: 01/05/19  Time: 1243 Phone: Call hosptial [phone] Outcome: Spoke with contact       Orbie Pyo 01/05/2019, 3:04 PM

## 2019-01-09 ENCOUNTER — Inpatient Hospital Stay: Payer: Self-pay | Admitting: Adult Health

## 2019-01-09 ENCOUNTER — Ambulatory Visit: Payer: Self-pay

## 2019-01-10 ENCOUNTER — Other Ambulatory Visit: Payer: Self-pay | Admitting: *Deleted

## 2019-01-10 NOTE — Patient Outreach (Signed)
[facility]) Care Management  01/10/2019  Pamela [name] May 10, 1951 [id number]  Telephone assessment Transition of care by PCP office   Referral received : 6/18  Referral source: Pamela [name] liaison Referral reason : Complex care management  Patient with Pamela [name] Admission 6/15-6/18 Dx:  Diarrhea , loss of taste, Acute Covid 19 positive   PMH reviewed : Essential hypertension, Chronic Diastolic heart failure, Sarcoidosis, COPD, CVA.   Outreach call to patient explained reason for the call , patient identify verified x 2 .  Patient reports feeling a little better today but still weak she reports still having no taste ,little smell but was able to eat a slice of pizza on today.  Patient denies having diarrhea, no fever. .  She discussed not sleeping well at night .  Patient discussed history of Heart failure , she has not started back to weighing daily since being at home. Patient does have scales , she is able to state knowledge of notifying MD of sudden weight gain of 3 pounds in a day and 5 in a week.  COPD Patient denies shortness of breath,cough and denies need for albuterol or nebulizer treatment since being home .    Social Patient lives at home with her son and daughter in law that patient reports as Covid positive and in quarantine.  Patient reports that they have been ordering food out most of the time   Patient reports being independent with ADL's at home,using a walker , reports using a cane prior to this admission.   Medications Patient was recently discharged from [name] and all medications have been reviewed.Patient states that she has not had her prescription for prednisone filled , she received paper prescription at discharge,she is home in quarantine due to Covid 19, she lives with her son and daughter in law that are also quarantining at home due to Covid 19 diagnosis.  She states family is unable to go out and get prescriptions, patient asking  if electronic prescription can be sent to [facility] and she will have a church member pick it up.  Advanced Directive Patient interested in receiving information .  Appointments  Patient agreeable to calling PCP office to arrange post discharge office .   Plan  Will update Pamela [name] assigned care manager of this outreach for follow in the next 2 weeks.   Provided with [facility] contact information.  Will send [name] welcome letter/packet, Advanced directive packet and EMMI Will send PCP barrier involvement letter   Secure chat message to Dr.Feliz Olevia Bowens , MD at patient discharge informing on patient having difficulty with getting paper  prescription to pharmacy to be filled as she is quarantined along with family and limited resource to come to home to pick up prescription, she does have a church member that will pick up medication at pharmacy.  As of 5pm on return response from MD.  Encouraged patient to look into other family and friends that will be able pick up prescription at her home to take to pharmacy. Emphasizing importance of taking medication as part to diagnosis treatment.  Will follow up with patient on the next day, and will contact PCP office to assist with electronic prescription if patient has been unable to get filled.   Current Outpatient Medications on File Prior to Visit  Medication Sig Dispense Refill  . acetaminophen (TYLENOL) 500 MG tablet Take 1,000 mg by mouth daily.     [name] Kitchen albuterol (PROAIR HFA) 108 (90 Base) MCG/ACT inhaler Inhale 2  puffs into the lungs every 6 (six) hours as needed for wheezing or shortness of breath. 3 Inhaler 1  . albuterol (PROVENTIL) (2.5 MG/3ML) 0.083% nebulizer solution Take 3 mLs (2.5 mg total) by nebulization every 6 (six) hours as needed for wheezing or shortness of breath. 100 mL 0  . aspirin 325 MG tablet Take 1 tablet (325 mg total) by mouth daily. 30 tablet 0  . atorvastatin (LIPITOR) 40 MG tablet Take 1 tablet (40 mg total) by mouth daily at 6  PM. 90 tablet 1  . budesonide-formoterol (SYMBICORT) 160-4.5 MCG/ACT inhaler Inhale 2 puffs into the lungs 2 (two) times daily. 18 g 1  . calcitRIOL (ROCALTROL) 0.25 MCG capsule Take 1 capsule (0.25 mcg total) by mouth daily. 30 capsule 0  . calcium carbonate (TUMS - DOSED IN MG ELEMENTAL CALCIUM) 500 MG chewable tablet Chew 1-2 tablets by mouth daily as needed for indigestion (gas).     . febuxostat (ULORIC) 40 MG tablet Take 1 tablet (40 mg total) by mouth daily. 90 tablet 1  . furosemide (LASIX) 40 MG tablet Take 1 tablet (40 mg total) by mouth 2 (two) times daily. For CHF 180 tablet 1  . metolazone (ZAROXOLYN) 2.5 MG tablet Take 1 tablet (2.5 mg total) by mouth 2 (two) times a week. Take on Mondays and Thursdays 24 tablet 1  . montelukast (SINGULAIR) 10 MG tablet Take 1 tablet (10 mg total) by mouth at bedtime. 90 tablet 1  . potassium chloride SA (K-DUR,KLOR-CON) 20 MEQ tablet Take 1 tablet (20 mEq total) by mouth daily. 90 tablet 1  . docusate sodium (COLACE) 100 MG capsule Take 1 capsule (100 mg total) by mouth 2 (two) times daily. (Patient not taking: Reported on 01/10/2019) 60 capsule 0  . predniSONE (DELTASONE) 10 MG tablet Takes 6 tablets for 1 days, then 5 tablets for 1 days, then 4 tablets for 1 days, then 3 tablets for 1 days, then 2 tabs for 1 days, then 1 tab for 1 days, and then stop. (Patient not taking: Reported on 01/10/2019) 21 tablet 0   No current facility-administered medications on file prior to visit.      Joylene Draft, RN, [place] Management Coordinator  [phone]- Mobile [phone]- Toll Free Main Office

## 2019-01-11 ENCOUNTER — Other Ambulatory Visit: Payer: Self-pay

## 2019-01-11 ENCOUNTER — Other Ambulatory Visit: Payer: Self-pay | Admitting: *Deleted

## 2019-01-11 MED ORDER — PREDNISONE 10 MG PO TABS: ORAL_TABLET | ORAL | 0 refills | Status: DC

## 2019-01-11 NOTE — Patient Outreach (Signed)
[facility]) Care Management  01/11/2019  Pamela [name] January 15, 1951 [id number]   Care Coordination call     Follow up call to patient to discuss if she has been able to make contact with friend or family to pick up prescription at her home and take to pharmacy to fill. Patient states that she has not made contact with anyone yet, encouraged her again regarding importance of prednisone for treatment of condition and to avoid readmission.   No return contact from MD at discharge regarding sending electronic prescription to pharmacy.  Placed call to [facility] to verified prescription for prednisone  has not been sent in.  Placed call to PCP office to request electronic prescription for prednisone be sent in to [facility] for patient to begin therapy. Explained to office patient and her son/daughter in law are all on quarantine due to covid 56.  Patient will have friend to pickup from pharmacy.   Received return call from provider office explained to nurse Mickel Baas  need for prescription,she confirmed that PCP office will be able to call in prescription to [facility].  Returned call to patient to inform she will friend pick up prescription and she will begin .   Reinforced with patient scheduling post dc visit she agreed that she would .  Reinforced with patient beginning to monitor weights for management of heart failure .    Joylene Draft, RN, [place] Management Coordinator  [phone]- Mobile [phone]- Toll Free Main Office

## 2019-01-18 ENCOUNTER — Other Ambulatory Visit: Payer: Self-pay

## 2019-01-18 ENCOUNTER — Ambulatory Visit (INDEPENDENT_AMBULATORY_CARE_PROVIDER_SITE_OTHER): Payer: Medicare Other | Admitting: Family Medicine

## 2019-01-18 ENCOUNTER — Ambulatory Visit (INDEPENDENT_AMBULATORY_CARE_PROVIDER_SITE_OTHER): Payer: Medicare Other | Admitting: *Deleted

## 2019-01-18 ENCOUNTER — Encounter: Payer: Self-pay | Admitting: Family Medicine

## 2019-01-18 DIAGNOSIS — I639 Cerebral infarction, unspecified: Secondary | ICD-10-CM

## 2019-01-18 DIAGNOSIS — D869 Sarcoidosis, unspecified: Secondary | ICD-10-CM | POA: Diagnosis not present

## 2019-01-18 DIAGNOSIS — U071 COVID-19: Secondary | ICD-10-CM

## 2019-01-18 DIAGNOSIS — I5030 Unspecified diastolic (congestive) heart failure: Secondary | ICD-10-CM

## 2019-01-18 NOTE — Progress Notes (Signed)
  Patient [name]  Virtual Visit via Telephone Note  I connected with Pamela [name] on 01/18/19 at 11:00 AM EDT by telephone and verified that I am speaking with the correct person using two identifiers.   I discussed the limitations, risks, security and privacy concerns of performing an evaluation and management service by telephone and the availability of in person appointments. I also discussed with the patient that there may be a patient responsible charge related to this service. The patient expressed understanding and agreed to proceed.   History of Present Illness: Pamela [name]  has a past medical history of Arthritis, Asthma, Cancer ([place]), CHF (congestive heart failure) ([place]), Congenital single kidney, COPD (chronic obstructive pulmonary disease) ([place]), Gout, Hypertension, Lung cancer ([place]) (2012), Mass of chest wall, right, Oxygen deficiency, Sarcoidosis, Sickle cell trait ([place]), and Sleep apnea. Patient hospitalized from 01/02/2019-01/05/2019 due to + COVID and infectious diarrhea.  She reports that her symptoms have resolved. She is doing well and without complaint today. She reports recording daily weights 341.2 today. She also states that she has been monitoring her temperatures and is having low readings of 76.7-83.3. She denies fever or chills. Her breathing is back to baseline.  Observations/Objective: Patient with regular voice tone, rate and rhythm. Speaking calmly and is in no apparent distress.    Assessment and Plan: 1. COVID-19 virus infection Resolved. Advised patient to check the accuracy of her thermometer.  2. Diastolic congestive heart failure, unspecified HF chronicity (HCC) 3. Sarcoidosis Stable at the present time.     Follow Up Instructions:  We discussed hand washing, using hand sanitizer when soap and water are not available, only going out when absolutely necessary, and social distancing. Explained to patient  that she is immunocompromised and will need to take precautions during this time.   I discussed the assessment and treatment plan with the patient. The patient was provided an opportunity to ask questions and all were answered. The patient agreed with the plan and demonstrated an understanding of the instructions.   The patient was advised to call back or seek an in-person evaluation if the symptoms worsen or if the condition fails to improve as anticipated.  I provided 7 minutes of non-face-to-face time during this encounter.  [name], FNP-BC Patient Pamela [name] [address] [place], [place] [address] [phone]

## 2019-01-19 LAB — CUP PACEART REMOTE DEVICE CHECK
Date Time Interrogation Session: 20200701220856
Implantable Pulse Generator Implant Date: 20200220

## 2019-01-23 ENCOUNTER — Other Ambulatory Visit: Payer: Self-pay

## 2019-01-24 ENCOUNTER — Other Ambulatory Visit: Payer: Self-pay

## 2019-01-24 NOTE — Patient Outreach (Signed)
[facility]) Care Management  01/24/2019   Pamela [name] 04-Dec-1950 [id number] Telephone assessment: Subjective: Placed call to patient to follow up on progress since admission to hospital with COVID and infectious diarrhea.  Patient reports today that she is doing great. Reports she is feeling really well. Reports increased energy, report normal taste at this time and is feeling very blessed.  Reports she has started to weigh again and weight ranges from 337-342 pounds. Reports following low salt diet.  Denies any recent diarrhea.   Reports living with son and daughter in law.  Reports everyone is well now.   Patient reports she had a telephone visit with MD.  Reports being able to care for herself now without difficulty.   Objective: awake and alert and voice sounds strong on the phone. Talking in complete sentences.  Vitals:   01/24/19 1700  Weight: (!) 340 lb 11.2 oz (154.5 kg)  Height: 1.676 m ('5\' 6"' )     Current Medications:  Current Outpatient Medications  Medication Sig Dispense Refill  . acetaminophen (TYLENOL) 500 MG tablet Take 1,000 mg by mouth daily.     [name] Kitchen albuterol (PROAIR HFA) 108 (90 Base) MCG/ACT inhaler Inhale 2 puffs into the lungs every 6 (six) hours as needed for wheezing or shortness of breath. 3 Inhaler 1  . albuterol (PROVENTIL) (2.5 MG/3ML) 0.083% nebulizer solution Take 3 mLs (2.5 mg total) by nebulization every 6 (six) hours as needed for wheezing or shortness of breath. 100 mL 0  . aspirin 325 MG tablet Take 1 tablet (325 mg total) by mouth daily. 30 tablet 0  . atorvastatin (LIPITOR) 40 MG tablet Take 1 tablet (40 mg total) by mouth daily at 6 PM. 90 tablet 1  . budesonide-formoterol (SYMBICORT) 160-4.5 MCG/ACT inhaler Inhale 2 puffs into the lungs 2 (two) times daily. 18 g 1  . calcitRIOL (ROCALTROL) 0.25 MCG capsule Take 1 capsule (0.25 mcg total) by mouth daily. 30 capsule 0  . calcium carbonate (TUMS - DOSED IN MG ELEMENTAL CALCIUM) 500 MG  chewable tablet Chew 1-2 tablets by mouth daily as needed for indigestion (gas).     [name] Kitchen docusate sodium (COLACE) 100 MG capsule Take 1 capsule (100 mg total) by mouth 2 (two) times daily. (Patient not taking: Reported on 01/10/2019) 60 capsule 0  . febuxostat (ULORIC) 40 MG tablet Take 1 tablet (40 mg total) by mouth daily. 90 tablet 1  . furosemide (LASIX) 40 MG tablet Take 1 tablet (40 mg total) by mouth 2 (two) times daily. For CHF 180 tablet 1  . metolazone (ZAROXOLYN) 2.5 MG tablet Take 1 tablet (2.5 mg total) by mouth 2 (two) times a week. Take on Mondays and Thursdays 24 tablet 1  . montelukast (SINGULAIR) 10 MG tablet Take 1 tablet (10 mg total) by mouth at bedtime. 90 tablet 1  . potassium chloride SA (K-DUR,KLOR-CON) 20 MEQ tablet Take 1 tablet (20 mEq total) by mouth daily. 90 tablet 1  . predniSONE (DELTASONE) 10 MG tablet Takes 6 tablets for 1 days, then 5 tablets for 1 days, then 4 tablets for 1 days, then 3 tablets for 1 days, then 2 tabs for 1 days, then 1 tab for 1 days, and then stop. 21 tablet 0   No current facility-administered medications for this visit.     Functional Status:  In your present state of health, do you have any difficulty performing the following activities: 01/24/2019 01/02/2019  Hearing? N N  Vision? N N  Difficulty  concentrating or making decisions? N N  Walking or climbing stairs? N Y  Dressing or bathing? N N  Doing errands, shopping? N Y  Conservation officer, nature and eating ? N -  Using the Toilet? N -  In the past six months, have you accidently leaked urine? N -  Do you have problems with loss of bowel control? N -  Managing your Medications? N -  Managing your Finances? N -  Housekeeping or managing your Housekeeping? N -  Some recent data might be hidden    Fall/Depression Screening: Fall Risk  01/24/2019 09/05/2018 09/01/2018  Falls in the past year? 1 0 0  Number falls in past yr: 0 - -  Injury with Fall? 0 - -  Risk for fall due to : - - -   PHQ 2/9  Scores 01/24/2019 09/05/2018 09/01/2018 06/15/2018 03/14/2018 01/03/2018 12/10/2017  PHQ - 2 Score 0 0 0 0 0 0 0    Assessment:  (1) Improved health status from last [facility] outreach. More energy and eating better (2) restarted daily weights and low salt diet. (3) denies diarrhea   Plan:  (1) encouraged patient to be a active as she can. Reviewed importance of good nutrition. (2) reviewed importance to continue to weigh daily (3) reviewed with patient when to call MD for changes in condition.  Plan: follow up call in 1 week to finish assessments. Encouraged patient to call sooner for problems or concerns.  THN CM Care Plan Problem One     Most Recent Value  Care Plan Problem One  High risk for readmission related to recent hospital discharge for COVID 19   Role Documenting the Problem One  Care Management [place] for Problem One  Active  THN Long Term Goal   Patient will not experience a hospital redmission over the next 31 days   THN Long Term Goal Start Date  01/10/19  Interventions for Problem One Long Term Goal  Reviewed current progress and importance of being active and having good nutrition. reviewed with patient when to call MD.   [facility] CM Short Term Goal #1   Patient will be able to identify at least 3 worsening symptoms to notify MD of .   [facility] CM Short Term Goal #1 Start Date  01/10/19  Interventions for Short Term Goal #1  Reviewed when to call MD like fever, cough or general worsening weakness.   [facility] CM Short Term Goal #2   Over the next 30 days patient will be able to report increased energy and strenght .   [facility]N CM Short Term Goal #2 Start Date  01/10/19  [facility] CM Short Term Goal #2 Met Date  01/24/19     This note sent to MD.  Tomasa Rand, RN, BSN, CEN [place] Coordinator [phone]

## 2019-01-27 NOTE — Progress Notes (Signed)
[facility] Summary Report / Loop Recorder 

## 2019-01-30 ENCOUNTER — Other Ambulatory Visit: Payer: Self-pay

## 2019-01-30 NOTE — Patient Outreach (Signed)
Telephone assessment:  Placed call to patient who reports that she is doing well. Reports no new problems or concerns at this time.    PLAN: reviewed with patient when to call MD to report new or changes in symptoms.  Will plan follow up call in 10 days.   Tomasa Rand, RN, BSN, CEN [facility] [employer] [phone]

## 2019-02-10 ENCOUNTER — Other Ambulatory Visit: Payer: Self-pay

## 2019-02-10 NOTE — Patient Outreach (Signed)
Case closure telephone outreach:  Placed call to patient who answered and identified herself. She reports she is doing great and feels like she has recovered. Denies any new problems or concerns today.  PLAN: case closure as goals are met. Will send case closure letter to patient and MD.  Tomasa Rand, RN, BSN, CEN [place] Coordinator [phone]

## 2019-02-15 ENCOUNTER — Ambulatory Visit (INDEPENDENT_AMBULATORY_CARE_PROVIDER_SITE_OTHER): Payer: Medicare Other | Admitting: Family Medicine

## 2019-02-15 ENCOUNTER — Encounter: Payer: Self-pay | Admitting: Family Medicine

## 2019-02-15 ENCOUNTER — Other Ambulatory Visit: Payer: Self-pay

## 2019-02-15 VITALS — BP 112/62 | HR 78 | Temp 98.6°F | Resp 18 | Ht 64.0 in | Wt 351.0 lb

## 2019-02-15 DIAGNOSIS — Z13820 Encounter for screening for osteoporosis: Secondary | ICD-10-CM | POA: Diagnosis not present

## 2019-02-15 DIAGNOSIS — I10 Essential (primary) hypertension: Secondary | ICD-10-CM | POA: Diagnosis not present

## 2019-02-15 DIAGNOSIS — Z780 Asymptomatic menopausal state: Secondary | ICD-10-CM

## 2019-02-15 DIAGNOSIS — R8290 Unspecified abnormal findings in urine: Secondary | ICD-10-CM | POA: Diagnosis not present

## 2019-02-15 LAB — POCT URINALYSIS DIPSTICK
Bilirubin, UA: NEGATIVE
Glucose, UA: NEGATIVE
Ketones, UA: NEGATIVE
Nitrite, UA: NEGATIVE
Protein, UA: NEGATIVE
Spec Grav, UA: 1.015 (ref 1.010–1.025)
Urobilinogen, UA: 0.2 E.U./dL
pH, UA: 6 (ref 5.0–8.0)

## 2019-02-15 NOTE — Progress Notes (Signed)
Patient [facility]   Progress Note: General Provider: Lanae Boast, FNP  SUBJECTIVE:   Pamela [name] is a 68 y.o. female who  has a past medical history of Arthritis, Asthma, Cancer ([place]), CHF (congestive heart failure) ([place]), Congenital single kidney, COPD (chronic obstructive pulmonary disease) ([place]), Gout, Hypertension, Lung cancer ([place]) (2012), Mass of chest wall, right, Oxygen deficiency, Sarcoidosis, Sickle cell trait ([place]), and Sleep apnea.. Patient presents today for Hypertension  Patient is here for 4 week follow up after being diagnosed and hospitalized for COVID infection. She reports that she is doing well and feels that she has fully recovered.  Patient reports compliance with all medications.  Patient denies side effects of medications.   Review of Systems  Constitutional: Negative.   HENT: Negative.   Eyes: Negative.   Respiratory: Negative.   Cardiovascular: Negative.   Gastrointestinal: Negative.   Genitourinary: Negative.   Musculoskeletal: Negative.   Skin: Negative.   Neurological: Negative.   Psychiatric/Behavioral: Negative.      OBJECTIVE: BP 112/62 (BP Location: Left Arm, Patient Position: Sitting, Cuff Size: Normal)   Pulse 78   Temp 98.6 F (37 C) (Oral)   Resp 18   Ht 5\' 4"  (1.626 m)   Wt (!) 351 lb (159.2 kg)   SpO2 97%   BMI 60.25 kg/m   Wt Readings from Last 3 Encounters:  02/15/19 (!) 351 lb (159.2 kg)  01/24/19 (!) 340 lb 11.2 oz (154.5 kg)  01/02/19 (!) 343 lb (155.6 kg)     Physical Exam Vitals signs and nursing note reviewed.  Constitutional:      General: She is not in acute distress.    Appearance: Normal appearance.  HENT:     Head: Normocephalic and atraumatic.  Eyes:     Extraocular Movements: Extraocular movements intact.     Conjunctiva/sclera: Conjunctivae normal.     Pupils: Pupils are equal, round, and reactive to light.  Cardiovascular:     Rate and Rhythm: Normal rate and  regular rhythm.     Heart sounds: No murmur.  Pulmonary:     Effort: Pulmonary effort is normal.     Breath sounds: Normal breath sounds.  Musculoskeletal: Normal range of motion.  Skin:    General: Skin is warm and dry.  Neurological:     Mental Status: She is alert and oriented to person, place, and time. Mental status is at baseline.     Gait: Gait abnormal (ambulates with cane).  Psychiatric:        Mood and Affect: Mood normal.        Behavior: Behavior normal.        Thought Content: Thought content normal.        Judgment: Judgment normal.     ASSESSMENT/PLAN:  1. Essential hypertension No medication changes warranted at the present time.   - Urinalysis Dipstick  2. Abnormal urinalysis No complaints today. Will culture and treat accordingly.  - Urine Culture  3. Encounter for screening for osteoporosis 4. Post-menopausal DEXA scan ordered for health maintenance.  - DG Bone Density; Future     Return in about 3 months (around 05/18/2019) for htn.    The patient was given clear instructions to go to ER or return to medical center if symptoms do not improve, worsen or new problems develop. The patient verbalized understanding and agreed with plan of care.   Ms. [name], FNP-BC Patient [facility] [phone]  [place], [place] [address] [phone]

## 2019-02-15 NOTE — Patient Instructions (Addendum)
Here is the information to the [facility] [address] [place], [place] [address] [phone] 8:00am - 5:00pm  Bone Density Test The bone density test uses a special type of X-ray to measure the amount of calcium and other minerals in your bones. It can measure bone density in the hip and the spine. The test procedure is similar to having a regular X-ray. This test may also be called:  Bone densitometry.  Bone mineral density test.  Dual-energy X-ray absorptiometry (DEXA). You may have this test to:  Diagnose a condition that causes weak or thin bones (osteoporosis).  Screen you for osteoporosis.  Predict your risk for a broken bone (fracture).  Determine how well your osteoporosis treatment is working. Tell a health care provider about:  Any allergies you have.  All medicines you are taking, including vitamins, herbs, eye drops, creams, and over-the-counter medicines.  Any problems you or family members have had with anesthetic medicines.  Any blood disorders you have.  Any surgeries you have had.  Any medical conditions you have.  Whether you are pregnant or may be pregnant.  Any medical tests you have had within the past 14 days that used contrast material. What are the risks? Generally, this is a safe procedure. However, it does expose you to a small amount of radiation, which can slightly increase your cancer risk. What happens before the procedure?  Do not take any calcium supplements starting 24 hours before your test.  Remove all metal jewelry, eyeglasses, dental appliances, and any other metal objects. What happens during the procedure?   You will lie down on an exam table. There will be an X-ray generator below you and an imaging device above you.  Other devices, such as boxes or braces, may be used to position your body properly for the scan.  The machine will slowly scan your body. You will need to keep still.   The images will show up on a screen in the room. Images will be examined by a specialist after your test is done. The procedure may vary among health care providers and hospitals. What happens after the procedure?  It is up to you to get your test results. Ask your health care provider, or the department that is doing the test, when your results will be ready. Summary  A bone density test is an imaging test that uses a type of X-ray to measure the amount of calcium and other minerals in your bones.  The test may be used to diagnose or screen you for a condition that causes weak or thin bones (osteoporosis), predict your risk for a broken bone (fracture), or determine how well your osteoporosis treatment is working.  Do not take any calcium supplements starting 24 hours before your test.  Ask your health care provider, or the department that is doing the test, when your results will be ready. This information is not intended to replace advice given to you by your health care provider. Make sure you discuss any questions you have with your health care provider. Document Released: 07/28/2004 Document Revised: 07/22/2017 Document Reviewed: 05/10/2017 Elsevier Patient Education  2020 [place] Maintenance, Female Adopting a healthy lifestyle and getting preventive care are important in promoting health and wellness. Ask your health care provider about:  The right schedule for you to have regular tests and exams.  Things you can do on your own to prevent diseases and keep yourself healthy. What should I know about  diet, weight, and exercise? Eat a healthy diet   Eat a diet that includes plenty of vegetables, fruits, low-fat dairy products, and lean protein.  Do not eat a lot of foods that are high in solid fats, added sugars, or sodium. Maintain a healthy weight Body mass index (BMI) is used to identify weight problems. It estimates body fat based on height and weight. Your  health care provider can help determine your BMI and help you achieve or maintain a healthy weight. Get regular exercise Get regular exercise. This is one of the most important things you can do for your health. Most adults should:  Exercise for at least 150 minutes each week. The exercise should increase your heart rate and make you sweat (moderate-intensity exercise).  Do strengthening exercises at least twice a week. This is in addition to the moderate-intensity exercise.  Spend less time sitting. Even light physical activity can be beneficial. Watch cholesterol and blood lipids Have your blood tested for lipids and cholesterol at 66 years of age, then have this test every 5 years. Have your cholesterol levels checked more often if:  Your lipid or cholesterol levels are high.  You are older than 66 years of age.  You are at high risk for heart disease. What should I know about cancer screening? Depending on your health history and family history, you may need to have cancer screening at various ages. This may include screening for:  Breast cancer.  Cervical cancer.  Colorectal cancer.  Skin cancer.  Lung cancer. What should I know about heart disease, diabetes, and high blood pressure? Blood pressure and heart disease  High blood pressure causes heart disease and increases the risk of stroke. This is more likely to develop in people who have high blood pressure readings, are of African descent, or are overweight.  Have your blood pressure checked: ? Every 3-5 years if you are 75-79 years of age. ? Every year if you are 73 years old or older. Diabetes Have regular diabetes screenings. This checks your fasting blood sugar level. Have the screening done:  Once every three years after age 96 if you are at a normal weight and have a low risk for diabetes.  More often and at a younger age if you are overweight or have a high risk for diabetes. What should I know about  preventing infection? Hepatitis B If you have a higher risk for hepatitis B, you should be screened for this virus. Talk with your health care provider to find out if you are at risk for hepatitis B infection. Hepatitis C Testing is recommended for:  Everyone born from [birth year] through 1963.  Anyone with known risk factors for hepatitis C. Sexually transmitted infections (STIs)  Get screened for STIs, including gonorrhea and chlamydia, if: ? You are sexually active and are younger than 66 years of age. ? You are older than 66 years of age and your health care provider tells you that you are at risk for this type of infection. ? Your sexual activity has changed since you were last screened, and you are at increased risk for chlamydia or gonorrhea. Ask your health care provider if you are at risk.  Ask your health care provider about whether you are at high risk for HIV. Your health care provider may recommend a prescription medicine to help prevent HIV infection. If you choose to take medicine to prevent HIV, you should first get tested for HIV. You should then be tested every  3 months for as long as you are taking the medicine. Pregnancy  If you are about to stop having your period (premenopausal) and you may become pregnant, seek counseling before you get pregnant.  Take 400 to 800 micrograms (mcg) of folic acid every day if you become pregnant.  Ask for birth control (contraception) if you want to prevent pregnancy. Osteoporosis and menopause Osteoporosis is a disease in which the bones lose minerals and strength with aging. This can result in bone fractures. If you are 33 years old or older, or if you are at risk for osteoporosis and fractures, ask your health care provider if you should:  Be screened for bone loss.  Take a calcium or vitamin D supplement to lower your risk of fractures.  Be given hormone replacement therapy (HRT) to treat symptoms of menopause. Follow these  instructions at home: Lifestyle  Do not use any products that contain nicotine or tobacco, such as cigarettes, e-cigarettes, and chewing tobacco. If you need help quitting, ask your health care provider.  Do not use street drugs.  Do not share needles.  Ask your health care provider for help if you need support or information about quitting drugs. Alcohol use  Do not drink alcohol if: ? Your health care provider tells you not to drink. ? You are pregnant, may be pregnant, or are planning to become pregnant.  If you drink alcohol: ? Limit how much you use to 0-1 drink a day. ? Limit intake if you are breastfeeding.  Be aware of how much alcohol is in your drink. In the [place] one drink equals one 12 oz bottle of beer (355 mL), one 5 oz glass of wine (148 mL), or one 1 oz glass of hard liquor (44 mL). General instructions  Schedule regular health, dental, and eye exams.  Stay current with your vaccines.  Tell your health care provider if: ? You often feel depressed. ? You have ever been abused or do not feel safe at home. Summary  Adopting a healthy lifestyle and getting preventive care are important in promoting health and wellness.  Follow your health care provider's instructions about healthy diet, exercising, and getting tested or screened for diseases.  Follow your health care provider's instructions on monitoring your cholesterol and blood pressure. This information is not intended to replace advice given to you by your health care provider. Make sure you discuss any questions you have with your health care provider. Document Released: 01/19/2011 Document Revised: 06/29/2018 Document Reviewed: 06/29/2018 Elsevier Patient Education  2020 [employer].

## 2019-02-20 ENCOUNTER — Ambulatory Visit (INDEPENDENT_AMBULATORY_CARE_PROVIDER_SITE_OTHER): Payer: Medicare Other | Admitting: *Deleted

## 2019-02-20 DIAGNOSIS — I639 Cerebral infarction, unspecified: Secondary | ICD-10-CM | POA: Diagnosis not present

## 2019-02-21 LAB — CUP PACEART REMOTE DEVICE CHECK
Date Time Interrogation Session: 20200803221009
Implantable Pulse Generator Implant Date: 20200220

## 2019-02-23 ENCOUNTER — Encounter: Payer: Self-pay | Admitting: Internal Medicine

## 2019-02-23 ENCOUNTER — Ambulatory Visit (INDEPENDENT_AMBULATORY_CARE_PROVIDER_SITE_OTHER): Payer: Medicare Other | Admitting: Internal Medicine

## 2019-02-23 ENCOUNTER — Ambulatory Visit: Payer: Medicare Other | Admitting: Internal Medicine

## 2019-02-23 ENCOUNTER — Other Ambulatory Visit: Payer: Self-pay

## 2019-02-23 DIAGNOSIS — I639 Cerebral infarction, unspecified: Secondary | ICD-10-CM | POA: Diagnosis not present

## 2019-02-23 DIAGNOSIS — J4530 Mild persistent asthma, uncomplicated: Secondary | ICD-10-CM | POA: Diagnosis not present

## 2019-02-23 NOTE — Patient Instructions (Signed)
No change in medications   Please schedule a follow up visit in 12  months but call sooner if needed  

## 2019-02-23 NOTE — Progress Notes (Signed)
Subjective:    Patient ID: Pamela [name], female    DOB: [birth year]  MRN: [id number]   Brief patient profile:  [phone] never smoker with 1st asthma attack in 1990s and on maint rx since late 90's  and freq courses of prednisone=  3-4 x per year despite advair and spiriva and freq saba referred to pulmonary clinic 04/27/2014 by Dr [name] s/p CT Bx 06/25/11 > no path report epic  but per oncology = [id number] adenoca of lung >  RT only per pt RUL completed 09/2011.     History of Present Illness  04/27/2014 1st [facility] office visit/ Pamela [name]   Chief Complaint  Patient presents with  . Pulmonary Consult    Referred by Dr. [name]. [name]  Pt recently moved to [place] from [name] and states needs to establish with new pulmonary MD.  She c/o DOE and cough, and states that she feels these symptoms are currently under control.    on prednisone 10 mg daily  since late July 2015 and still on freq saba and 2lpm  Already used   2puffs proair am of ov  rec Stop spiriva and advair Start dulera 100 Take 2 puffs first thing in am and then another 2 puffs about 12 hours later.  Work on inhaler technique:  relax and gently blow all the way out then take a nice smooth deep breath back in, triggering the inhaler at same time you start breathing in.  Hold for up to 5 seconds if you can.  Rinse and gargle with water when done Only use your albuterol (proair) as a rescue medication  If proair not helping, then use the neb and if needing the neb more than occastional, then take prednisone 10 mg daily  Please schedule a follow up office visit in 4 weeks, sooner if needed with all inhalers in hand for pfts on return   11/30/2014 f/u ov/Pamela [name] re: mild intermittent asthma ? Related to weather / still on acei plus on prednisone 10 mg daily for gout  Chief Complaint  Patient presents with  . Acute Visit    Pt c/o increased SOB for the past month. She is  also coughing and wheezing. Cough is prod with minimal yellow to clear sputum. She uses proair 2 x daily on average.   Also due for CT chest to f/u dx of lung ca dx March 2013  never surgery/ RT only  Onset of dry cough / wheeze was insidious/ pattern is progressively worse day >> noct with increasing  need for saba but very poor hfa  rec Stop lisinopril and start valsartan 80 mg one daily instead  Only use your albuterol as a rescue medication   Restart dulera 100 Take 2 puffs first thing in am and then another 2 puffs about 12 hours later until you only need  your rescue inhaler no more than twice weekly for cough/wheeze/ short of breath  Follow up with Dr [name] as planned and only see me if needed    06/06/2015  f/u ov/Pamela [name] re: worse since finished pred ? One week prior to [place]  / was taking for gout  Chief Complaint  Patient presents with  . Acute Visit    Increased SOB with or without exertion for the past 4 days. She also c/o cough and wheezing. Cough is prod with clear sputum.     Was doing much better while  on prednisone and gradually worse since off  Last used proventil about 7 am prior to OV  At 1130  On symbicort but hfa quite poor > see a/p Has neb but no meds for it  rec Prednisone Take 4 for three days 3 for three days 2 for three days 1 for three days and stop Work on inhaler technique:  relax and gently blow all the way out then take a nice smooth deep breath back in, triggering the inhaler at same time you start breathing in.  Hold for up to 5 seconds if you can. Blow out thru nose. Rinse and gargle with water when done Plan A = Automatic = symbicort 80 Take 2 puffs first thing in am and then another 2 puffs about 12 hours later.  Plan B = backup  Only use your albuterol(proair)   Plan C = crisis - only use nebulizer albuterol if you try the proair and it doesn't work         02/22/2018  f/u ov/Pamela [name] re: asthma / off 02 / symb/flonase / does not use med cal as rec   Chief Complaint  Patient presents with  . Follow-up    Breathing has been doing well. She states she has had cough with clear sputum for the past wk. She rarely uses albuterol inhaler or neb.   Dyspnea:  MMRC3 = can't walk 100 yards even at a slow pace at a flat grade s stopping due to sob or back Cough: not much Sleeping: < 30 degrees multiple pillows  SABA use: as above  rec Work on inhaler technique:  - late add: consider adding singulair if not well controlled      08/02/2018 acute extended ov/Pamela [name] re: sym 160 2bid / no longer flonase / was on pred 2.5 mg qod  Chief Complaint  Patient presents with  . Acute Visit    Pt c/o cough with yellow to green sputum for the past 5 days. She had fever yesterday.  She has been having to use her rescue inhaler and neb daily since her symptoms started.    states was doing fine on just symb 160  2 bid when abruptly ill x 5 d prior to OV  With st, fever, wheeze/ cough prod puruelnt sputum and fever one day prior to OV  But not on day of ov, feels better p saba  Previously not needing ventolin or neb but up to bid now -did not realize she could go to every 4 hours as needed as per the action plan she was already provided.  rec zpak  Prednisone 10 mg take  4 each am x 2 days,   2 each am x 2 days,  1 each am x 2 days and stop   Please schedule a follow up office visit in 4 weeks, sooner if needed pfts on return - add also needs feno on return     08/30/2018  f/u ov/Pamela [name] re: moderate chronic asthma maint on symb 160 2bid no prednisone listed  Chief Complaint  Patient presents with  . Follow-up    PFT's done today. Breathing has improved since the last visit. She uses her albuterol inhaler rarely.   Dyspnea:  Walks at [employer] with cane  Cough: none Sleeping:  Bed flat, 4 pillows  SABA use: none 02: no  rec - add singulair 10 mg daily due to high feno and repeat feno next ov    02/23/2019  f/u ov/Pamela [name] re:  Asthma /  MO maint symb 160/singulair   Chief Complaint  Patient presents with  . Follow-up    Patient reports that her sob and cough have  been doing well.    Dyspnea:  [employer] with cane  Cough: no Sleeping: flat bed with 4 head pillows SABA use: not recently 02: none    No obvious day to day or daytime variability or assoc excess/ purulent sputum or mucus plugs or hemoptysis or cp or chest tightness, subjective wheeze or overt sinus or hb symptoms.   Sleeping fine as above without nocturnal  or early am exacerbation  of respiratory  c/o's or need for noct saba. Also denies any obvious fluctuation of symptoms with weather or environmental changes or other aggravating or alleviating factors except as outlined above   No unusual exposure hx or h/o childhood pna/ asthma or knowledge of premature birth.  Current Allergies, Complete Past Medical History, Past Surgical History, Family History, and Social History were reviewed in [employer] record.  ROS  The following are not active complaints unless bolded Hoarseness, sore throat, dysphagia, dental problems, itching, sneezing,  nasal congestion or discharge of excess mucus or purulent secretions, ear ache,   fever, chills, sweats, unintended wt loss or wt gain, classically pleuritic or exertional cp,  orthopnea pnd or arm/hand swelling  or leg swelling, presyncope, palpitations, abdominal pain, anorexia, nausea, vomiting, diarrhea  or change in bowel habits or change in bladder habits, change in stools or change in urine, dysuria, hematuria,  rash, arthralgias, visual complaints, headache, numbness, weakness or ataxia or problems with walking or coordination,  change in mood or  memory.        Current Meds  Medication Sig  . acetaminophen (TYLENOL) 500 MG tablet Take 1,000 mg by mouth daily.   [name] Kitchen albuterol (PROAIR HFA) 108 (90 Base) MCG/ACT inhaler Inhale 2 puffs into the lungs every 6 (six) hours as needed for wheezing or shortness of breath.  [name] Kitchen albuterol  (PROVENTIL) (2.5 MG/3ML) 0.083% nebulizer solution Take 3 mLs (2.5 mg total) by nebulization every 6 (six) hours as needed for wheezing or shortness of breath.  [name] Kitchen aspirin 325 MG tablet Take 1 tablet (325 mg total) by mouth daily.  [name] Kitchen atorvastatin (LIPITOR) 40 MG tablet Take 1 tablet (40 mg total) by mouth daily at 6 PM.  . budesonide-formoterol (SYMBICORT) 160-4.5 MCG/ACT inhaler Inhale 2 puffs into the lungs 2 (two) times daily.  . calcitRIOL (ROCALTROL) 0.25 MCG capsule Take 1 capsule (0.25 mcg total) by mouth daily.  . calcium carbonate (TUMS - DOSED IN MG ELEMENTAL CALCIUM) 500 MG chewable tablet Chew 1-2 tablets by mouth daily as needed for indigestion (gas).   [name] Kitchen docusate sodium (COLACE) 100 MG capsule Take 1 capsule (100 mg total) by mouth 2 (two) times daily.  . febuxostat (ULORIC) 40 MG tablet Take 1 tablet (40 mg total) by mouth daily.  . furosemide (LASIX) 40 MG tablet Take 1 tablet (40 mg total) by mouth 2 (two) times daily. For CHF  . metolazone (ZAROXOLYN) 2.5 MG tablet Take 1 tablet (2.5 mg total) by mouth 2 (two) times a week. Take on Mondays and Thursdays  . montelukast (SINGULAIR) 10 MG tablet Take 1 tablet (10 mg total) by mouth at bedtime.  . potassium chloride SA (K-DUR,KLOR-CON) 20 MEQ tablet Take 1 tablet (20 mEq total) by mouth daily.                 Objective:   Physical Exam  Obese bf  nad      11/30/2014  343 > 06/06/2015  355 > 07/03/2015  360 > 06/01/2016  358 > 10/15/2016  366 > 02/25/2017   364 > 02/22/2018  365 > 08/02/2018  360 >  08/30/2018  366 > 02/23/2019  354  Vital signs reviewed - Note on arrival 02 sats  99% on RA        HEENT: full dentures,  nl  turbinates bilaterally, and oropharynx. Nl external ear canals without cough reflex   NECK :  without JVD/Nodes/TM/ nl carotid upstrokes bilaterally   LUNGS: no acc muscle use,  Nl contour chest which is clear to A and P bilaterally without cough on insp or exp maneuvers   CV:  RRR  no s3 or murmur or  increase in P2, and severe chronic pitting both LE's  ABD:  Massively obese  nontender with  Limited  inspiratory excursion   No bruits or organomegaly appreciated, bowel sounds nl  MS:   slow gait with cane  ext warm without deformities, calf tenderness, cyanosis or clubbing No obvious joint restrictions   SKIN: warm and dry with elephantiasis changes both LE's    NEURO:  alert, approp, nl sensorium with  no motor or cerebellar deficits apparent.

## 2019-02-28 ENCOUNTER — Encounter: Payer: Self-pay | Admitting: Internal Medicine

## 2019-02-28 NOTE — Assessment & Plan Note (Signed)
Complicated by hbp/ pre-dm   Body mass index is 57.14 kg/m.  -  trending down some, encouraged Lab Results  Component Value Date   TSH 3.934 05/21/2014     Contributing to gerd risk/ doe/reviewed the need and the process to achieve and maintain neg calorie balance > defer f/u primary care including intermittently monitoring thyroid status     Each maintenance medication was reviewed in detail including most importantly the difference between maintenance and as needed and under what circumstances the prns are to be used.  Please see AVS for specific  Instructions which are unique to this visit and I personally typed out  which were reviewed in detail in writing with the patient and a copy provided.

## 2019-02-28 NOTE — Assessment & Plan Note (Signed)
Onset 1990's while living in [name]  pfts 07/31/13  FEV1  1.53 (58%) with ratio 66 > p saba ratio 74 FEV1 1.68 (64%)  - -04/27/2014  try dulera 100 2 bid  - PFT's 07/19/2014 FEV1 2.00 (93%) and ratio 85 after 21% improvement from saba with no inhalers x one week  - trial off acei 12/01/2014 due to pseudoasthma component > resolved  - Spirometry 06/01/2016  FEV1 1.06 (48%)  Ratio 61  - FENO 06/01/2016  =   85 on symbicort 80 x2 > increase to 160  - Prednisone 10 mg floor x years, rec increase  to 20 mg if needing neb  - 10/15/2016 reduced pred to 5 mg daily (gout flares with taper)  - Spirometry 02/22/2018  FEV1 1.23 (57%)  Ratio 71 with min curvature  - FENO 02/22/2018  = 61 - 02/22/2018  After extensive coaching inhaler device  effectiveness =    90% from floor of 75%     All goals of chronic asthma control met including optimal function and elimination of symptoms with minimal need for rescue therapy.  Contingencies discussed in full including contacting this office immediately if not controlling the symptoms using the rule of two's.      F/u q 12 m, sooner prn

## 2019-03-01 ENCOUNTER — Encounter: Payer: Self-pay | Admitting: Neurology

## 2019-03-01 ENCOUNTER — Other Ambulatory Visit: Payer: Self-pay

## 2019-03-01 ENCOUNTER — Ambulatory Visit (INDEPENDENT_AMBULATORY_CARE_PROVIDER_SITE_OTHER): Payer: Medicare Other | Admitting: Neurology

## 2019-03-01 VITALS — BP 120/78 | HR 68 | Temp 96.0°F | Wt 336.0 lb

## 2019-03-01 DIAGNOSIS — I639 Cerebral infarction, unspecified: Secondary | ICD-10-CM | POA: Diagnosis not present

## 2019-03-01 DIAGNOSIS — I6381 Other cerebral infarction due to occlusion or stenosis of small artery: Secondary | ICD-10-CM

## 2019-03-01 NOTE — Progress Notes (Signed)
[facility] [address] [place]. [place] [address] [phone]       OFFICE FOLLOW-UP NOTE  [name] Date of Birth:  08/06/1950 Medical Record Number:  [id number]   HPI: [name] is a 68 year old African-American lady seen today for initial office  visit following hospital admission for stroke in February 2020.  History is obtained from the patient, review of electronic medical records and I personally reviewed imaging films in PACS.She has p/m/h of morbid obesity, sarcoidosis, sickle cell trait, sleep apnea, congestive heart failure and asthma.  She woke up on 09/06/2018 with sudden onset of dizziness, blurred vision.  She went to the bathroom and fell.  She remembers that she cried out for help but then was unconscious for a while.  Her son found her on the floor at 5 in the morning.  She had apparently hit her forehead and right knee.  She had significant slurred speech and had trouble opening her eyes.  She was brought to [facility] for evaluation.  CT scan of the head revealed no acute abnormality but MRI scan showed bithalamic medial acute infarcts.  Neurological exam the patient could not remember what had happened and she had left frontal bruise as well as right knee pain due to a large bruise but otherwise nonfocal exam.  MRI of the brain revealed no significant large vessel stenosis or occlusion.  Carotid ultrasound showed no extracranial stenosis.  2D echo showed normal ejection fraction without cardiac source of embolism.  TEE showed a small PFO but no clot or vegetation.  Lower extremity venous Dopplers were negative for DVT.  EEG was normal without seizure activity.  LDL cholesterol was elevated at 95 mg percent.  Hemoglobin A1c was 6.0.  Patient had loop recorder inserted and so for paroxysmal A. fib has not yet been found on last interrogation on 02/21/2019.[name] Kitchen  She is on aspirin which is tolerating well without bruising or bleeding.  Her blood  pressures well controlled today it is 120/78.  She is on Lipitor which is also she is tolerating well but cannot tell me when last lipid profile was checked.  Her sugars have all been satisfactory.  She states she has no lasting neurological deficits from her stroke but she still has knee pain from the fall and injury.  She walks with a cane.  She has had no recent falls or injuries.  She has had no recurrent stroke or TIA symptoms.  Her weight continues to be significant and her BMI is 54.23  .  ROS:   14 system review of systems is positive for no complaints today and all systems negative PMH:  Past Medical History:  Diagnosis Date  . Arthritis   . Asthma   . Cancer ([facility])    lung, adenocarcinoma right lung 2012  . CHF (congestive heart failure) ([facility])    Preserved EF  . Congenital single kidney    With chronic kidney disease  . COPD (chronic obstructive pulmonary disease) ([facility])   . Gout   . Hypertension   . Lung cancer [facility]) 2012   Right upper lobe lung adenocarcinoma diagnosed with needle biopsy treated by SBRT finished treatment April 2013 has been monitored since  . Mass of chest wall, right    Right chest wall mass 7.3 cm biopsy on 12/13/2013. Patient notes it was consistent with sarcoidosis but actual pathology results not available.  . Oxygen deficiency   . Sarcoidosis   . Sickle cell trait ([facility])   .  Sleep apnea     Social History:  Social History   Socioeconomic History  . Marital status: Divorced    Spouse name: Not on file  . Number of children: 2  . Years of education: Not on file  . Highest education level: Not on file  Occupational History  . Not on file  Social Needs  . Financial resource strain: Not on file  . Food insecurity    Worry: Not on file    Inability: Not on file  . Transportation needs    Medical: Not on file    Non-medical: Not on file  Tobacco Use  . Smoking status: Never Smoker  . Smokeless tobacco: Never Used  Substance and Sexual  Activity  . Alcohol use: No    Alcohol/week: 0.0 standard drinks  . Drug use: No  . Sexual activity: Not Currently  Lifestyle  . Physical activity    Days per week: Not on file    Minutes per session: Not on file  . Stress: Not on file  Relationships  . Social Herbalist on phone: Not on file    Gets together: Not on file    Attends religious service: Not on file    Active member of club or organization: Not on file    Attends meetings of clubs or organizations: Not on file    Relationship status: Not on file  . Intimate partner violence    Fear of current or ex partner: Not on file    Emotionally abused: Not on file    Physically abused: Not on file    Forced sexual activity: Not on file  Other Topics Concern  . Not on file  Social History Narrative   Lives with son.  One son is deceased.      Medications:   Current Outpatient Medications on File Prior to Visit  Medication Sig Dispense Refill  . acetaminophen (TYLENOL) 500 MG tablet Take 1,000 mg by mouth daily.     [name] Kitchen albuterol (PROAIR HFA) 108 (90 Base) MCG/ACT inhaler Inhale 2 puffs into the lungs every 6 (six) hours as needed for wheezing or shortness of breath. 3 Inhaler 1  . albuterol (PROVENTIL) (2.5 MG/3ML) 0.083% nebulizer solution Take 3 mLs (2.5 mg total) by nebulization every 6 (six) hours as needed for wheezing or shortness of breath. 100 mL 0  . aspirin 325 MG tablet Take 1 tablet (325 mg total) by mouth daily. 30 tablet 0  . atorvastatin (LIPITOR) 40 MG tablet Take 1 tablet (40 mg total) by mouth daily at 6 PM. 90 tablet 1  . budesonide-formoterol (SYMBICORT) 160-4.5 MCG/ACT inhaler Inhale 2 puffs into the lungs 2 (two) times daily. 18 g 1  . calcitRIOL (ROCALTROL) 0.25 MCG capsule Take 1 capsule (0.25 mcg total) by mouth daily. 30 capsule 0  . calcium carbonate (TUMS - DOSED IN MG ELEMENTAL CALCIUM) 500 MG chewable tablet Chew 1-2 tablets by mouth daily as needed for indigestion (gas).     [name] Kitchen docusate  sodium (COLACE) 100 MG capsule Take 1 capsule (100 mg total) by mouth 2 (two) times daily. 60 capsule 0  . febuxostat (ULORIC) 40 MG tablet Take 1 tablet (40 mg total) by mouth daily. 90 tablet 1  . furosemide (LASIX) 40 MG tablet Take 1 tablet (40 mg total) by mouth 2 (two) times daily. For CHF 180 tablet 1  . metolazone (ZAROXOLYN) 2.5 MG tablet Take 1 tablet (2.5 mg total) by mouth 2 (two)  times a week. Take on Mondays and Thursdays 24 tablet 1  . montelukast (SINGULAIR) 10 MG tablet Take 1 tablet (10 mg total) by mouth at bedtime. 90 tablet 1  . potassium chloride SA (K-DUR,KLOR-CON) 20 MEQ tablet Take 1 tablet (20 mEq total) by mouth daily. 90 tablet 1   No current facility-administered medications on file prior to visit.     Allergies:   Allergies  Allergen Reactions  . Sulfur Rash    Physical Exam General: Morbidly obese middle-aged African-American lady, seated, in no evident distress Head: head normocephalic and atraumatic.  Neck: supple with no carotid or supraclavicular bruits Cardiovascular: regular rate and rhythm, no murmurs Musculoskeletal: no deformity Skin:  no rash/petichiae Vascular:  Normal pulses all extremities Vitals:   03/01/19 1026  BP: 120/78  Pulse: 68  Temp: (!) 96 F (35.6 C)   Neurologic Exam Mental Status: Awake and fully alert. Oriented to place and time. Recent and remote memory intact. Attention span, concentration and fund of knowledge appropriate. Mood and affect appropriate.  Cranial Nerves: Fundoscopic exam reveals sharp disc margins. Pupils equal, briskly reactive to light. Extraocular movements full without nystagmus. Visual fields full to confrontation. Hearing intact. Facial sensation intact. Face, tongue, palate moves normally and symmetrically.  Motor: Normal bulk and tone. Normal strength in all tested extremity muscles. Sensory.: intact to touch ,pinprick .position and vibratory sensation.  Coordination: Rapid alternating movements  normal in all extremities. Finger-to-nose and heel-to-shin performed accurately bilaterally. Gait and Station: Arises from chair without difficulty. Stance is broad-based. Gait demonstrates normal stride length and mild imbalance .  I am able to heel, toe and tandem walk .  Reflexes: 1+ and symmetric. Toes downgoing.   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  Vascular risk factors of morbid obesity, hyperlipidemia and CHF     PLAN:  I had a long d/w patient about her recent  Cryptogenic bithalamic stroke, risk for recurrent stroke/TIAs, personally independently reviewed imaging studies and stroke evaluation results and answered questions.Continue aspirin 325 mg daily  for secondary stroke prevention and maintain strict control of hypertension with blood pressure goal below 130/90, diabetes with hemoglobin A1c goal below 6.5% and lipids with LDL cholesterol goal below 70 mg/dL. I also advised the patient to eat a healthy diet with plenty of whole grains, cereals, fruits and vegetables, exercise regularly and maintain ideal body weight.  Check lipid profile and hemoglobin A1c.  Consider possible participation in the [place] trial if interested.  Followup in the future with my nurse practitioner Janett Billow in 6 months or call earlier if necessary. Greater than 50% of time during this 25 minute visit was spent on counseling,explanation of diagnosis of cryptogenic stroke, planning of further management, discussion with patient and family and coordination of care Antony Contras, MD  [facility] [address] [place], [place] [address]  Phone [phone] Fax [phone] Note: This document was prepared with digital dictation and possible smart phrase technology. Any transcriptional errors that result  from this process are unintentional

## 2019-03-01 NOTE — Patient Instructions (Signed)
I had a long d/w patient about her recent  Cryptogenic bithalamic stroke, risk for recurrent stroke/TIAs, personally independently reviewed imaging studies and stroke evaluation results and answered questions.Continue aspirin 325 mg daily  for secondary stroke prevention and maintain strict control of hypertension with blood pressure goal below 130/90, diabetes with hemoglobin A1c goal below 6.5% and lipids with LDL cholesterol goal below 70 mg/dL. I also advised the patient to eat a healthy diet with plenty of whole grains, cereals, fruits and vegetables, exercise regularly and maintain ideal body weight.  Check lipid profile and hemoglobin A1c.  Consider possible participation in the [place] trial if interested.  Followup in the future with my nurse practitioner Janett Billow in 6 months or call earlier if necessary.  Stroke Prevention Some medical conditions and behaviors are associated with a higher chance of having a stroke. You can help prevent a stroke by making nutrition, lifestyle, and other changes, including managing any medical conditions you may have. What nutrition changes can be made?   Eat healthy foods. You can do this by: ? Choosing foods high in fiber, such as fresh fruits and vegetables and whole grains. ? Eating at least 5 or more servings of fruits and vegetables a day. Try to fill half of your plate at each meal with fruits and vegetables. ? Choosing lean protein foods, such as lean cuts of meat, poultry without skin, fish, tofu, beans, and nuts. ? Eating low-fat dairy products. ? Avoiding foods that are high in salt (sodium). This can help lower blood pressure. ? Avoiding foods that have saturated fat, trans fat, and cholesterol. This can help prevent high cholesterol. ? Avoiding processed and premade foods.  Follow your health care provider's specific guidelines for losing weight, controlling high blood pressure (hypertension), lowering high cholesterol, and managing diabetes.  These may include: ? Reducing your daily calorie intake. ? Limiting your daily sodium intake to 1,500 milligrams (mg). ? Using only healthy fats for cooking, such as olive oil, canola oil, or sunflower oil. ? Counting your daily carbohydrate intake. What lifestyle changes can be made?  Maintain a healthy weight. Talk to your health care provider about your ideal weight.  Get at least 30 minutes of moderate physical activity at least 5 days a week. Moderate activity includes brisk walking, biking, and swimming.  Do not use any products that contain nicotine or tobacco, such as cigarettes and e-cigarettes. If you need help quitting, ask your health care provider. It may also be helpful to avoid exposure to secondhand smoke.  Limit alcohol intake to no more than 1 drink a day for nonpregnant women and 2 drinks a day for men. One drink equals 12 oz of beer, 5 oz of wine, or 1 oz of hard liquor.  Stop any illegal drug use.  Avoid taking birth control pills. Talk to your health care provider about the risks of taking birth control pills if: ? You are over 55 years old. ? You smoke. ? You get migraines. ? You have ever had a blood clot. What other changes can be made?  Manage your cholesterol levels. ? Eating a healthy diet is important for preventing high cholesterol. If cholesterol cannot be managed through diet alone, you may also need to take medicines. ? Take any prescribed medicines to control your cholesterol as told by your health care provider.  Manage your diabetes. ? Eating a healthy diet and exercising regularly are important parts of managing your blood sugar. If your blood sugar cannot be  managed through diet and exercise, you may need to take medicines. ? Take any prescribed medicines to control your diabetes as told by your health care provider.  Control your hypertension. ? To reduce your risk of stroke, try to keep your blood pressure below 130/80. ? Eating a healthy  diet and exercising regularly are an important part of controlling your blood pressure. If your blood pressure cannot be managed through diet and exercise, you may need to take medicines. ? Take any prescribed medicines to control hypertension as told by your health care provider. ? Ask your health care provider if you should monitor your blood pressure at home. ? Have your blood pressure checked every year, even if your blood pressure is normal. Blood pressure increases with age and some medical conditions.  Get evaluated for sleep disorders (sleep apnea). Talk to your health care provider about getting a sleep evaluation if you snore a lot or have excessive sleepiness.  Take over-the-counter and prescription medicines only as told by your health care provider. Aspirin or blood thinners (antiplatelets or anticoagulants) may be recommended to reduce your risk of forming blood clots that can lead to stroke.  Make sure that any other medical conditions you have, such as atrial fibrillation or atherosclerosis, are managed. What are the warning signs of a stroke? The warning signs of a stroke can be easily remembered as BEFAST.  B is for balance. Signs include: ? Dizziness. ? Loss of balance or coordination. ? Sudden trouble walking.  E is for eyes. Signs include: ? A sudden change in vision. ? Trouble seeing.  F is for face. Signs include: ? Sudden weakness or numbness of the face. ? The face or eyelid drooping to one side.  A is for arms. Signs include: ? Sudden weakness or numbness of the arm, usually on one side of the body.  S is for speech. Signs include: ? Trouble speaking (aphasia). ? Trouble understanding.  T is for time. ? These symptoms may represent a serious problem that is an emergency. Do not wait to see if the symptoms will go away. Get medical help right away. Call your local emergency services (911 in the [place] Do not drive yourself to the hospital.  Other signs of  stroke may include: ? A sudden, severe headache with no known cause. ? Nausea or vomiting. ? Seizure. Where to find more information For more information, visit:  American Stroke Association: www.strokeassociation.org  National Stroke Association: www.stroke.org Summary  You can prevent a stroke by eating healthy, exercising, not smoking, limiting alcohol intake, and managing any medical conditions you may have.  Do not use any products that contain nicotine or tobacco, such as cigarettes and e-cigarettes. If you need help quitting, ask your health care provider. It may also be helpful to avoid exposure to secondhand smoke.  Remember BEFAST for warning signs of stroke. Get help right away if you or a loved one has any of these signs. This information is not intended to replace advice given to you by your health care provider. Make sure you discuss any questions you have with your health care provider. Document Released: 08/13/2004 Document Revised: 06/18/2017 Document Reviewed: 08/11/2016 Elsevier Patient Education  2020 [employer].

## 2019-03-01 NOTE — Progress Notes (Signed)
[facility] Summary Report / Loop Recorder 

## 2019-03-02 LAB — LIPID PANEL
Chol/HDL Ratio: 2.7 ratio (ref 0.0–4.4)
Cholesterol, Total: 130 mg/dL (ref 100–199)
HDL: 48 mg/dL (ref >39)
LDL Calculated: 61 mg/dL (ref 0–99)
Triglycerides: 105 mg/dL (ref 0–149)
VLDL Cholesterol Cal: 21 mg/dL (ref 5–40)

## 2019-03-02 LAB — HEMOGLOBIN A1C
Est. average glucose Bld gHb Est-mCnc: 128 mg/dL
Hgb A1c MFr Bld: 6.1 % — ABNORMAL HIGH (ref 4.8–5.6)

## 2019-03-06 ENCOUNTER — Telehealth: Payer: Self-pay

## 2019-03-06 NOTE — Telephone Encounter (Signed)
-----   Message from Garvin Fila, MD sent at 03/03/2019  1:50 PM EDT ----- Mitchell Heir inform the patient that cholesterol profile and test for diabetes were both satisfactory.  No action needed at this time.

## 2019-03-06 NOTE — Telephone Encounter (Signed)
I called pt that her lab work for cholesterol and diabetes were satisfactory. No action needed at this time. PT verbalized understanding.[name] Kitchen  ------

## 2019-03-14 DIAGNOSIS — N179 Acute kidney failure, unspecified: Secondary | ICD-10-CM | POA: Diagnosis not present

## 2019-03-14 DIAGNOSIS — M109 Gout, unspecified: Secondary | ICD-10-CM | POA: Diagnosis not present

## 2019-03-14 DIAGNOSIS — D869 Sarcoidosis, unspecified: Secondary | ICD-10-CM | POA: Diagnosis not present

## 2019-03-14 DIAGNOSIS — A0839 Other viral enteritis: Secondary | ICD-10-CM | POA: Diagnosis not present

## 2019-03-14 DIAGNOSIS — J45909 Unspecified asthma, uncomplicated: Secondary | ICD-10-CM | POA: Diagnosis not present

## 2019-03-14 DIAGNOSIS — N2581 Secondary hyperparathyroidism of renal origin: Secondary | ICD-10-CM | POA: Diagnosis not present

## 2019-03-14 DIAGNOSIS — D631 Anemia in chronic kidney disease: Secondary | ICD-10-CM | POA: Diagnosis not present

## 2019-03-14 DIAGNOSIS — I509 Heart failure, unspecified: Secondary | ICD-10-CM | POA: Diagnosis not present

## 2019-03-14 DIAGNOSIS — N183 Chronic kidney disease, stage 3 (moderate): Secondary | ICD-10-CM | POA: Diagnosis not present

## 2019-03-14 DIAGNOSIS — N189 Chronic kidney disease, unspecified: Secondary | ICD-10-CM | POA: Diagnosis not present

## 2019-03-14 DIAGNOSIS — I129 Hypertensive chronic kidney disease with stage 1 through stage 4 chronic kidney disease, or unspecified chronic kidney disease: Secondary | ICD-10-CM | POA: Diagnosis not present

## 2019-03-14 DIAGNOSIS — M1990 Unspecified osteoarthritis, unspecified site: Secondary | ICD-10-CM | POA: Diagnosis not present

## 2019-03-14 DIAGNOSIS — J449 Chronic obstructive pulmonary disease, unspecified: Secondary | ICD-10-CM | POA: Diagnosis not present

## 2019-03-14 DIAGNOSIS — U071 COVID-19: Secondary | ICD-10-CM | POA: Diagnosis not present

## 2019-03-16 DIAGNOSIS — R7309 Other abnormal glucose: Secondary | ICD-10-CM | POA: Diagnosis not present

## 2019-03-16 DIAGNOSIS — H2513 Age-related nuclear cataract, bilateral: Secondary | ICD-10-CM | POA: Diagnosis not present

## 2019-03-16 DIAGNOSIS — H25013 Cortical age-related cataract, bilateral: Secondary | ICD-10-CM | POA: Diagnosis not present

## 2019-03-16 DIAGNOSIS — H35033 Hypertensive retinopathy, bilateral: Secondary | ICD-10-CM | POA: Diagnosis not present

## 2019-03-20 ENCOUNTER — Telehealth: Payer: Self-pay

## 2019-03-20 DIAGNOSIS — M109 Gout, unspecified: Secondary | ICD-10-CM

## 2019-03-20 MED ORDER — FEBUXOSTAT 40 MG PO TABS: 40.0000 mg | ORAL_TABLET | Freq: Every day | ORAL | 1 refills | Status: DC

## 2019-03-20 NOTE — Telephone Encounter (Signed)
Refills sent into pharmacy. Thanks!  

## 2019-03-21 ENCOUNTER — Ambulatory Visit (INDEPENDENT_AMBULATORY_CARE_PROVIDER_SITE_OTHER): Payer: Medicare Other | Admitting: Podiatry

## 2019-03-21 ENCOUNTER — Other Ambulatory Visit: Payer: Self-pay

## 2019-03-21 DIAGNOSIS — M79675 Pain in left toe(s): Secondary | ICD-10-CM | POA: Diagnosis not present

## 2019-03-21 DIAGNOSIS — B351 Tinea unguium: Secondary | ICD-10-CM

## 2019-03-21 DIAGNOSIS — M722 Plantar fascial fibromatosis: Secondary | ICD-10-CM

## 2019-03-21 DIAGNOSIS — M79674 Pain in right toe(s): Secondary | ICD-10-CM

## 2019-03-28 ENCOUNTER — Ambulatory Visit (INDEPENDENT_AMBULATORY_CARE_PROVIDER_SITE_OTHER): Payer: Medicare Other | Admitting: *Deleted

## 2019-03-28 DIAGNOSIS — I639 Cerebral infarction, unspecified: Secondary | ICD-10-CM

## 2019-03-28 NOTE — Progress Notes (Signed)
Subjective: 68 year old female presents the office today with a flare of plantar fasciitis to both of her feet.  She is interested in having injections performed today to both feet.  Denies any recent injury or trauma she denies any swelling or redness.  No falls. She also asking for her nails be trimmed today's are thickened elongated and cause discomfort.  Denies any redness or drainage or any signs of infection of the toenail sites. Denies any systemic complaints such as fevers, chills, nausea, vomiting. No acute changes since last appointment, and no other complaints at this time.   Objective: AAO x3, NAD DP/PT pulses palpable bilaterally, CRT less than 3 seconds Tenderness to palpation of the medial tubercle of the calcaneus at insertion of plantar fascia bilaterally.  There is no pain with lateral compression of calcaneus.  No pain with Achilles tendon.  There is no localized edema, erythema.  No other areas of pinpoint tenderness.   Nails are hypertrophic, dystrophic, brittle, discolored, elongated 10. No surrounding redness or drainage. Tenderness nails 1-5 bilaterally. No open lesions or pre-ulcerative lesions are identified today. No open lesions or pre-ulcerative lesions.  No pain with calf compression, swelling, warmth, erythema  Assessment: Bilateral plantar fasciitis, symptomatic onychomycosis  Plan: -All treatment options discussed with the patient including all alternatives, risks, complications.  -She wanted to hold off on x-rays today.  Steroid injections were performed bilaterally.  See procedure notes below.  Discussed stretching, icing daily. -Nails debrided x10 without any complications or bleeding. -Patient encouraged to call the office with any questions, concerns, change in symptoms.   Return in about 3 months (around 06/20/2019).  Trula Slade DPM

## 2019-03-29 ENCOUNTER — Encounter (HOSPITAL_COMMUNITY): Payer: Self-pay | Admitting: *Deleted

## 2019-03-29 ENCOUNTER — Encounter (HOSPITAL_COMMUNITY): Payer: Self-pay

## 2019-03-29 LAB — CUP PACEART REMOTE DEVICE CHECK
Date Time Interrogation Session: 20200905220813
Implantable Pulse Generator Implant Date: 20200220

## 2019-03-31 ENCOUNTER — Encounter (HOSPITAL_COMMUNITY): Payer: Self-pay | Admitting: Surgery

## 2019-04-03 ENCOUNTER — Telehealth: Payer: Self-pay

## 2019-04-03 DIAGNOSIS — I639 Cerebral infarction, unspecified: Secondary | ICD-10-CM

## 2019-04-03 MED ORDER — ATORVASTATIN CALCIUM 40 MG PO TABS: 40.0000 mg | ORAL_TABLET | Freq: Every day | ORAL | 1 refills | Status: DC

## 2019-04-03 NOTE — Telephone Encounter (Signed)
Refill for atorvastatin sent to pharmacy.

## 2019-04-12 NOTE — Progress Notes (Signed)
[facility] Summary Report / Loop Recorder 

## 2019-04-21 ENCOUNTER — Other Ambulatory Visit: Payer: Self-pay | Admitting: Family Medicine

## 2019-04-21 DIAGNOSIS — Z1231 Encounter for screening mammogram for malignant neoplasm of breast: Secondary | ICD-10-CM

## 2019-04-27 ENCOUNTER — Ambulatory Visit (INDEPENDENT_AMBULATORY_CARE_PROVIDER_SITE_OTHER): Payer: Medicare Other | Admitting: *Deleted

## 2019-04-27 DIAGNOSIS — I639 Cerebral infarction, unspecified: Secondary | ICD-10-CM | POA: Diagnosis not present

## 2019-04-28 LAB — CUP PACEART REMOTE DEVICE CHECK
Date Time Interrogation Session: 20201008221250
Implantable Pulse Generator Implant Date: 20200220

## 2019-05-03 ENCOUNTER — Telehealth: Payer: Self-pay | Admitting: Hematology

## 2019-05-03 NOTE — Telephone Encounter (Signed)
Returned patient's phone call regarding rescheduling an appointment, left a voicemail. 

## 2019-05-05 ENCOUNTER — Other Ambulatory Visit: Payer: Self-pay

## 2019-05-05 ENCOUNTER — Ambulatory Visit
Admission: RE | Admit: 2019-05-05 | Discharge: 2019-05-05 | Disposition: A | Discharge status: Home / Self Care | Payer: Medicare Other | Source: Ambulatory Visit | Attending: Family Medicine | Admitting: Family Medicine

## 2019-05-05 DIAGNOSIS — Z13820 Encounter for screening for osteoporosis: Secondary | ICD-10-CM | POA: Diagnosis not present

## 2019-05-05 DIAGNOSIS — Z780 Asymptomatic menopausal state: Secondary | ICD-10-CM

## 2019-05-08 ENCOUNTER — Telehealth: Payer: Self-pay | Admitting: Internal Medicine

## 2019-05-08 DIAGNOSIS — I5030 Unspecified diastolic (congestive) heart failure: Secondary | ICD-10-CM

## 2019-05-08 MED ORDER — METOLAZONE 2.5 MG PO TABS: 2.5000 mg | ORAL_TABLET | ORAL | 1 refills | Status: DC

## 2019-05-08 NOTE — Telephone Encounter (Signed)
Refill sent to pharmacy. Thanks.

## 2019-05-08 NOTE — Progress Notes (Signed)
[facility] Summary Report / Loop Recorder 

## 2019-05-18 ENCOUNTER — Ambulatory Visit: Payer: Medicare Other | Admitting: Family Medicine

## 2019-05-30 ENCOUNTER — Encounter: Payer: Self-pay | Admitting: Family Medicine

## 2019-05-30 ENCOUNTER — Other Ambulatory Visit: Payer: Self-pay

## 2019-05-30 ENCOUNTER — Ambulatory Visit (INDEPENDENT_AMBULATORY_CARE_PROVIDER_SITE_OTHER): Payer: Medicare Other | Admitting: Family Medicine

## 2019-05-30 ENCOUNTER — Ambulatory Visit (INDEPENDENT_AMBULATORY_CARE_PROVIDER_SITE_OTHER): Payer: Medicare Other | Admitting: *Deleted

## 2019-05-30 VITALS — BP 120/66 | HR 64 | Temp 98.1°F | Resp 16 | Ht 66.0 in | Wt 350.0 lb

## 2019-05-30 DIAGNOSIS — J4530 Mild persistent asthma, uncomplicated: Secondary | ICD-10-CM

## 2019-05-30 DIAGNOSIS — I10 Essential (primary) hypertension: Secondary | ICD-10-CM

## 2019-05-30 DIAGNOSIS — I639 Cerebral infarction, unspecified: Secondary | ICD-10-CM

## 2019-05-30 DIAGNOSIS — R7303 Prediabetes: Secondary | ICD-10-CM | POA: Diagnosis not present

## 2019-05-30 DIAGNOSIS — D869 Sarcoidosis, unspecified: Secondary | ICD-10-CM

## 2019-05-30 DIAGNOSIS — I5030 Unspecified diastolic (congestive) heart failure: Secondary | ICD-10-CM | POA: Diagnosis not present

## 2019-05-30 LAB — CUP PACEART REMOTE DEVICE CHECK
Date Time Interrogation Session: 20201110211430
Implantable Pulse Generator Implant Date: 20200220

## 2019-05-30 LAB — POCT URINALYSIS DIPSTICK
Bilirubin, UA: NEGATIVE
Blood, UA: NEGATIVE
Glucose, UA: NEGATIVE
Ketones, UA: NEGATIVE
Nitrite, UA: NEGATIVE
Protein, UA: NEGATIVE
Spec Grav, UA: 1.015 (ref 1.010–1.025)
Urobilinogen, UA: 0.2 E.U./dL
pH, UA: 5.5 (ref 5.0–8.0)

## 2019-05-30 NOTE — Progress Notes (Signed)
Established Patient Office Visit  Subjective:  Patient ID: Pamela [name], female    DOB: 09/01/1950  Age: 68 y.o. MRN: [id number]  CC:  Chief Complaint  Patient presents with  . Hypertension  . Ear Problem    decreased hearing in left ear   . Medication Refill    potassium     HPI Pamela [name], a very pleasant 68 year old female with a history of essential hypertension, chronic diastolic CHF, history of CVA, obstructive sleep apnea, chronic respiratory failure with hypoxia, chronic asthma, solitary kidney, sarcoidosis, prediabetes, and severe morbid obesity presents for follow-up of chronic conditions.  Patient states that she has been doing well and has minimal complaints on today.  She also says that her hearing has been decreased in the left ear and she thinks that she has a cerumen impaction.  Patient states that she has been taking all medications consistently without interruption.  She does not check blood pressures at home.  She typically follows a low-sodium diet.  She does not exercise due to chronic bilateral knee pain and difficulty with ambulation.  Patient is not followed by cardiology.  She denies headache, dizziness, blurred vision, chest pain, heart palpitations, shortness of breath or tachypnea.  She endorses chronic bilateral lower extremity edema. Patient also has a history of chronic mild persistent asthma. She also has a history of sarcoidosis and is followed by pulmonology every 6 months.  She has not had an asthma exacerbation in greater than 1 year.  Symptoms are controlled by daily Symbicort, montelukast, and albuterol as needed.  Asthma symptoms are typically triggered by changes in weather, pollen, and/or pet dander.  She denies wheezing, shortness of breath, and/or persistent cough.  Patient also has a history of prediabetes.  Her most recent hemoglobin A1c was 6.1.  She manages prediabetes with diet.  Patient has not started on antidiabetic medication.  She denies  polyuria, polydipsia, polyphagia, dizziness, or paresthesias.  Past Medical History:  Diagnosis Date  . Arthritis   . Asthma   . Cancer ([place])    lung, adenocarcinoma right lung 2012  . CHF (congestive heart failure) (H[facility])    Preserved EF  . Congenital single kidney    With chronic kidney disease  . COPD (chronic obstructive pulmonary disease) ([place])   . Gout   . Hypertension   . 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 Mass of chest wall, right    Right chest wall mass 7.3 cm biopsy on 12/13/2013. Patient notes it was consistent with sarcoidosis but actual pathology results not available.  . Oxygen deficiency   . Sarcoidosis   . Sickle cell trait ([place])   . Sleep apnea     Past Surgical History:  Procedure Laterality Date  . LOOP RECORDER INSERTION N/A 09/08/2018   Procedure: LOOP RECORDER INSERTION;  Surgeon: Evans Lance, MD;  Location: [facility];  Service: Cardiovascular;  Laterality: N/A;  . LUNG BIOPSY    . TEE WITHOUT CARDIOVERSION N/A 09/08/2018   Procedure: TRANSESOPHAGEAL ECHOCARDIOGRAM (TEE);  Surgeon: Josue Hector, MD;  Location: [facility] ENDOSCOPY;  Service: Cardiovascular;  Laterality: N/A;  with loop  . TUBAL LIGATION    . VEIN LIGATION AND STRIPPING      Family History  Problem Relation Age of Onset  . Hypertension Mother   . Renal Disease Mother   .  Cancer Father        stomach  . Heart disease Father        No details  . Cancer Brother   . Diabetes Brother   . Cervical cancer Sister   . Diabetes Sister   . Multiple myeloma Sister     Social History   Socioeconomic History  . Marital status: Divorced    Spouse name: Not on file  . Number of children: 2  . Years of education: Not on file  . Highest education level: Not on file  Occupational History  . Not on file  Social Needs  . Financial resource strain: Not on  file  . Food insecurity    Worry: Not on file    Inability: Not on file  . Transportation needs    Medical: Not on file    Non-medical: Not on file  Tobacco Use  . Smoking status: Never Smoker  . Smokeless tobacco: Never Used  Substance and Sexual Activity  . Alcohol use: No    Alcohol/week: 0.0 standard drinks  . Drug use: No  . Sexual activity: Not Currently  Lifestyle  . Physical activity    Days per week: Not on file    Minutes per session: Not on file  . Stress: Not on file  Relationships  . Social Herbalist on phone: Not on file    Gets together: Not on file    Attends religious service: Not on file    Active member of club or organization: Not on file    Attends meetings of clubs or organizations: Not on file    Relationship status: Not on file  . Intimate partner violence    Fear of current or ex partner: Not on file    Emotionally abused: Not on file    Physically abused: Not on file    Forced sexual activity: Not on file  Other Topics Concern  . Not on file  Social History Narrative   Lives with son.  One son is deceased.      Outpatient Medications Prior to Visit  Medication Sig Dispense Refill  . acetaminophen (TYLENOL) 500 MG tablet Take 1,000 mg by mouth daily.     [name] Kitchen albuterol (PROAIR HFA) 108 (90 Base) MCG/ACT inhaler Inhale 2 puffs into the lungs every 6 (six) hours as needed for wheezing or shortness of breath. 3 Inhaler 1  . albuterol (PROVENTIL) (2.5 MG/3ML) 0.083% nebulizer solution Take 3 mLs (2.5 mg total) by nebulization every 6 (six) hours as needed for wheezing or shortness of breath. 100 mL 0  . aspirin 325 MG tablet Take 1 tablet (325 mg total) by mouth daily. 30 tablet 0  . atorvastatin (LIPITOR) 40 MG tablet Take 1 tablet (40 mg total) by mouth daily at 6 PM. 90 tablet 1  . budesonide-formoterol (SYMBICORT) 160-4.5 MCG/ACT inhaler Inhale 2 puffs into the lungs 2 (two) times daily. 18 g 1  . calcitRIOL (ROCALTROL) 0.25 MCG capsule  Take 1 capsule (0.25 mcg total) by mouth daily. 30 capsule 0  . calcitRIOL (ROCALTROL) 0.25 MCG capsule     . calcium carbonate (TUMS - DOSED IN MG ELEMENTAL CALCIUM) 500 MG chewable tablet Chew 1-2 tablets by mouth daily as needed for indigestion (gas).     [name] Kitchen docusate sodium (COLACE) 100 MG capsule Take 1 capsule (100 mg total) by mouth 2 (two) times daily. 60 capsule 0  . febuxostat (ULORIC) 40 MG tablet Take 1 tablet (40  mg total) by mouth daily. 90 tablet 1  . furosemide (LASIX) 40 MG tablet Take 1 tablet (40 mg total) by mouth 2 (two) times daily. For CHF 180 tablet 1  . metolazone (ZAROXOLYN) 2.5 MG tablet Take 1 tablet (2.5 mg total) by mouth 2 (two) times a week. Take on Mondays and Thursdays 24 tablet 1  . montelukast (SINGULAIR) 10 MG tablet Take 1 tablet (10 mg total) by mouth at bedtime. 90 tablet 1  . potassium chloride SA (K-DUR,KLOR-CON) 20 MEQ tablet Take 1 tablet (20 mEq total) by mouth daily. 90 tablet 1  . SYMBICORT 160-4.5 MCG/ACT inhaler      No facility-administered medications prior to visit.     Allergies  Allergen Reactions  . Sulfur Rash    ROS Review of Systems  Constitutional: Negative.  Negative for activity change, chills, fatigue and fever.  HENT: Negative.   Eyes: Negative.   Respiratory: Negative for chest tightness, shortness of breath and wheezing.   Cardiovascular: Negative.   Gastrointestinal: Negative.   Endocrine: Negative.   Genitourinary: Negative.   Musculoskeletal: Positive for arthralgias (bilateral knee pain).  Allergic/Immunologic: Positive for environmental allergies. Negative for food allergies.  Neurological: Negative.  Negative for dizziness.  Hematological: Negative.   Psychiatric/Behavioral: Negative.       Objective:    Physical Exam  Constitutional: She is oriented to person, place, and time. She appears well-developed and well-nourished.  HENT:  Head: Normocephalic and atraumatic.  Right Ear: External ear normal.  Left  Ear: External ear normal.  Nose: Nose normal.  Mouth/Throat: Oropharynx is clear and moist.  Eyes: Pupils are equal, round, and reactive to light.  Neck: Normal range of motion. Neck supple.  Cardiovascular: Normal rate, regular rhythm, normal heart sounds and intact distal pulses.  No murmur heard. Pulmonary/Chest: Effort normal and breath sounds normal.  Abdominal: Soft. Bowel sounds are normal.  Musculoskeletal: Normal range of motion.  Neurological: She is alert and oriented to person, place, and time.  Skin: Skin is warm and dry.  Psychiatric: She has a normal mood and affect. Her behavior is normal. Judgment and thought content normal.    BP 120/66 (BP Location: Left Wrist, Patient Position: Sitting, Cuff Size: Normal)   Pulse 64   Temp 98.1 F (36.7 C) (Oral)   Resp 16   Ht '5\' 6"'  (1.676 m)   Wt (!) 350 lb (158.8 kg)   SpO2 99%   BMI 56.49 kg/m  Wt Readings from Last 3 Encounters:  05/30/19 (!) 350 lb (158.8 kg)  03/01/19 (!) 336 lb (152.4 kg)  02/23/19 (!) 354 lb (160.6 kg)     Health Maintenance Due  Topic Date Due  . PNA vac Low Risk Adult (2 of 2 - PPSV23) 04/14/2019    There are no preventive care reminders to display for this patient.  Lab Results  Component Value Date   TSH 3.934 05/21/2014   Lab Results  Component Value Date   WBC 5.6 01/05/2019   HGB 12.5 01/05/2019   HCT 39.4 01/05/2019   MCV 69.4 (L) 01/05/2019   PLT 142 (L) 01/05/2019   Lab Results  Component Value Date   NA 138 01/05/2019   K 4.4 01/05/2019   CHLORIDE 103 05/31/2017   CO2 24 01/05/2019   GLUCOSE 168 (H) 01/05/2019   BUN 57 (H) 01/05/2019   CREATININE 1.60 (H) 01/05/2019   BILITOT 0.2 (L) 01/05/2019   ALKPHOS 108 01/05/2019   AST 28 01/05/2019   ALT  14 01/05/2019   PROT 6.7 01/05/2019   ALBUMIN 2.8 (L) 01/05/2019   CALCIUM 7.8 (L) 01/05/2019   ANIONGAP 15 01/05/2019   EGFR 38 (L) 05/31/2017   Lab Results  Component Value Date   CHOL 130 03/01/2019   Lab  Results  Component Value Date   HDL 48 03/01/2019   Lab Results  Component Value Date   LDLCALC 61 03/01/2019   Lab Results  Component Value Date   TRIG 105 03/01/2019   Lab Results  Component Value Date   CHOLHDL 2.7 03/01/2019   Lab Results  Component Value Date   HGBA1C 6.1 (H) 03/01/2019      Assessment & Plan:   Problem List Items Addressed This Visit      Cardiovascular and Mediastinum   Essential hypertension - Primary   Relevant Orders   Urinalysis Dipstick (Completed)     Other   Sarcoidosis   Prediabetes    Other Visit Diagnoses    Severe obesity (BMI >= 40) (HCC)       Gouty arthritis           Essential hypertension Controlled on current medication regimen, no changes warranted on today. Continue medication, monitor blood pressure at home. Continue DASH diet. Reminder to go to the ER if any CP, SOB, nausea, dizziness, severe HA, changes vision/speech, left arm numbness and tingling and jaw pain.  - Urinalysis Dipstick - Basic Metabolic Panel  Prediabetes Discussed carbohydrate modified diet at length. Also, the importance of staying active to decrease weight.  - HgB A1c  Severe obesity (BMI >= 40) (HCC) Estimated body mass index is 56.49 kg/m as calculated from the following:   Height as of this encounter: '5\' 6"'  (1.676 m).   Weight as of this encounter: 350 lb (158.8 kg).  The patient is asked to make an attempt to improve diet and exercise patterns to aid in medical management of this problem.   Sarcoidosis Stable. Continue to follow up with pulmonology as scheduled.   Mild persistent chronic asthma without complication Asthma is controlled on current medication regimen. No changes are warranted on today.    Follow-up: 3 months for chronic conditions   [place]  APRN, MSN, FNP-C [facility] [address] [place], [place] [address] [phone]

## 2019-05-30 NOTE — Patient Instructions (Signed)

## 2019-05-31 ENCOUNTER — Telehealth: Payer: Self-pay

## 2019-05-31 LAB — BASIC METABOLIC PANEL
BUN/Creatinine Ratio: 29 — ABNORMAL HIGH (ref 12–28)
BUN: 59 mg/dL — ABNORMAL HIGH (ref 8–27)
CO2: 29 mmol/L (ref 20–29)
Calcium: 8.6 mg/dL — ABNORMAL LOW (ref 8.7–10.3)
Chloride: 96 mmol/L (ref 96–106)
Creatinine, Ser: 2.07 mg/dL — ABNORMAL HIGH (ref 0.57–1.00)
GFR calc Af Amer: 28 mL/min/{1.73_m2} — ABNORMAL LOW (ref >59)
GFR calc non Af Amer: 24 mL/min/{1.73_m2} — ABNORMAL LOW (ref >59)
Glucose: 99 mg/dL (ref 65–99)
Potassium: 3 mmol/L — ABNORMAL LOW (ref 3.5–5.2)
Sodium: 142 mmol/L (ref 134–144)

## 2019-05-31 NOTE — Telephone Encounter (Signed)
Called, no answer. Left a voicemail to call back. Thanks!

## 2019-05-31 NOTE — Telephone Encounter (Signed)
-----   Message from Dorena Dew, [place] sent at 05/31/2019  6:59 AM EST ----- Regarding: lab results Please inform Ms. Doria that her kidney functioning has worsened since previous visit. Creatinine has increased from 1.6-2. Advise patient to avoid all nephrotoxic medications such as naproxen and ibuprofen. Will repeat in 1 month. Also, inquire whether patient is established with nephrology, if not, I will send a referral.    [name]  APRN, MSN, FNP-C [facility] [address] [place], [place] [address] [phone]

## 2019-06-01 NOTE — Telephone Encounter (Signed)
Called, no answer. Left a message on mobile number listed.

## 2019-06-01 NOTE — Telephone Encounter (Signed)
Called house number. No answer, no voicemail picked up.

## 2019-06-02 NOTE — Telephone Encounter (Signed)
Called and spoke with patient, she does see Pamela [name] and has a follow up scheduled with them in approximately 3 months. Advised that creatine has worsened since last check from 1.6 to 2 and asked that she make sure she avoids nephrotoxic medications such as naproxen and ibuprofen. Advised that we will repeat levels in 1 month and tried to schedule an appointment however patient asked to call back to make this appointment. Thanks !

## 2019-06-05 ENCOUNTER — Ambulatory Visit
Admission: RE | Admit: 2019-06-05 | Discharge: 2019-06-05 | Disposition: A | Discharge status: Home / Self Care | Payer: Medicare Other | Source: Ambulatory Visit | Attending: Family Medicine | Admitting: Family Medicine

## 2019-06-05 ENCOUNTER — Telehealth: Payer: Self-pay | Admitting: Internal Medicine

## 2019-06-05 ENCOUNTER — Other Ambulatory Visit: Payer: Self-pay

## 2019-06-05 DIAGNOSIS — Z1231 Encounter for screening mammogram for malignant neoplasm of breast: Secondary | ICD-10-CM | POA: Diagnosis not present

## 2019-06-05 DIAGNOSIS — J4530 Mild persistent asthma, uncomplicated: Secondary | ICD-10-CM

## 2019-06-05 MED ORDER — MONTELUKAST SODIUM 10 MG PO TABS: 10.0000 mg | ORAL_TABLET | Freq: Every day | ORAL | 1 refills | Status: DC

## 2019-06-05 NOTE — Telephone Encounter (Signed)
Refill sent to pharmacy. Thanks.

## 2019-06-16 ENCOUNTER — Telehealth: Payer: Self-pay | Admitting: Hematology

## 2019-06-16 NOTE — Telephone Encounter (Signed)
Cancelled per 11/27 scheduled message, per patient's request appointment cancelled. Voicemail is full.

## 2019-06-19 NOTE — Progress Notes (Signed)
[facility] Summary Report / Loop Recorder 

## 2019-06-20 ENCOUNTER — Ambulatory Visit: Payer: Medicare Other | Admitting: Podiatry

## 2019-06-22 ENCOUNTER — Ambulatory Visit: Payer: Medicare Other | Admitting: Hematology

## 2019-07-03 ENCOUNTER — Ambulatory Visit (INDEPENDENT_AMBULATORY_CARE_PROVIDER_SITE_OTHER): Payer: Medicare Other | Admitting: *Deleted

## 2019-07-03 DIAGNOSIS — I639 Cerebral infarction, unspecified: Secondary | ICD-10-CM

## 2019-07-03 LAB — CUP PACEART REMOTE DEVICE CHECK
Date Time Interrogation Session: 20201213221219
Implantable Pulse Generator Implant Date: 20200220

## 2019-07-18 ENCOUNTER — Other Ambulatory Visit: Payer: Self-pay

## 2019-07-18 ENCOUNTER — Encounter: Payer: Self-pay | Admitting: Family Medicine

## 2019-07-18 ENCOUNTER — Ambulatory Visit (INDEPENDENT_AMBULATORY_CARE_PROVIDER_SITE_OTHER): Payer: Medicare Other | Admitting: Family Medicine

## 2019-07-18 VITALS — BP 128/64 | HR 68 | Temp 98.2°F | Ht 66.0 in | Wt 351.8 lb

## 2019-07-18 DIAGNOSIS — M25511 Pain in right shoulder: Secondary | ICD-10-CM | POA: Diagnosis not present

## 2019-07-18 DIAGNOSIS — Z6841 Body Mass Index (BMI) 40.0 and over, adult: Secondary | ICD-10-CM

## 2019-07-18 DIAGNOSIS — Z09 Encounter for follow-up examination after completed treatment for conditions other than malignant neoplasm: Secondary | ICD-10-CM

## 2019-07-18 DIAGNOSIS — I10 Essential (primary) hypertension: Secondary | ICD-10-CM | POA: Diagnosis not present

## 2019-07-18 DIAGNOSIS — R8290 Unspecified abnormal findings in urine: Secondary | ICD-10-CM | POA: Diagnosis not present

## 2019-07-18 DIAGNOSIS — E66813 Obesity, class 3: Secondary | ICD-10-CM

## 2019-07-18 DIAGNOSIS — N390 Urinary tract infection, site not specified: Secondary | ICD-10-CM

## 2019-07-18 DIAGNOSIS — J4530 Mild persistent asthma, uncomplicated: Secondary | ICD-10-CM

## 2019-07-18 LAB — POCT URINALYSIS DIPSTICK
Bilirubin, UA: NEGATIVE
Glucose, UA: NEGATIVE
Ketones, UA: NEGATIVE
Nitrite, UA: NEGATIVE
Protein, UA: NEGATIVE
Spec Grav, UA: 1.02 (ref 1.010–1.025)
Urobilinogen, UA: 0.2 E.U./dL
pH, UA: 6 (ref 5.0–8.0)

## 2019-07-18 MED ORDER — SULFAMETHOXAZOLE-TRIMETHOPRIM 800-160 MG PO TABS: 1.0000 | ORAL_TABLET | Freq: Two times a day (BID) | ORAL | 0 refills | Status: DC

## 2019-07-18 NOTE — Patient Instructions (Signed)
Shoulder Pain Many things can cause shoulder pain, including:  An injury.  Moving the shoulder in the same way again and again (overuse).  Joint pain (arthritis). Pain can come from:  Swelling and irritation (inflammation) of any part of the shoulder.  An injury to the shoulder joint.  An injury to: ? Tissues that connect muscle to bone (tendons). ? Tissues that connect bones to each other (ligaments). ? Bones. Follow these instructions at home: Watch for changes in your symptoms. Let your doctor know about them. Follow these instructions to help with your pain. If you have a sling:  Wear the sling as told by your doctor. Remove it only as told by your doctor.  Loosen the sling if your fingers: ? Tingle. ? Become numb. ? Turn cold and blue.  Keep the sling clean.  If the sling is not waterproof: ? Do not let it get wet. ? Take the sling off when you shower or bathe. Managing pain, stiffness, and swelling   If told, put ice on the painful area: ? Put ice in a plastic bag. ? Place a towel between your skin and the bag. ? Leave the ice on for 20 minutes, 2-3 times a day. Stop putting ice on if it does not help with the pain.  Squeeze a soft ball or a foam pad as much as possible. This prevents swelling in the shoulder. It also helps to strengthen the arm. General instructions  Take over-the-counter and prescription medicines only as told by your doctor.  Keep all follow-up visits as told by your doctor. This is important. Contact a doctor if:  Your pain gets worse.  Medicine does not help your pain.  You have new pain in your arm, hand, or fingers. Get help right away if:  Your arm, hand, or fingers: ? Tingle. ? Are numb. ? Are swollen. ? Are painful. ? Turn white or blue. Summary  Shoulder pain can be caused by many things. These include injury, moving the shoulder in the same away again and again, and joint pain.  Watch for changes in your symptoms.  Let your doctor know about them.  This condition may be treated with a sling, ice, and pain medicine.  Contact your doctor if the pain gets worse or you have new pain. Get help right away if your arm, hand, or fingers tingle or get numb, swollen, or painful.  Keep all follow-up visits as told by your doctor. This is important. This information is not intended to replace advice given to you by your health care provider. Make sure you discuss any questions you have with your health care provider. Document Released: 12/23/2007 Document Revised: 01/18/2018 Document Reviewed: 01/18/2018 Elsevier Patient Education  2020 [employer]vier Inc[employer]  

## 2019-07-18 NOTE — Progress Notes (Signed)
Patient [facility]   Established Patient Office Visit  Subjective:  Patient ID: Pamela [name], female    DOB: [birth year]  Age: 68 y.o. MRN: [id number]  CC:  Chief Complaint  Patient presents with  . Follow-up    HTN    HPI Pamela [name] is a 67 year old female presents for Follow Up today.  Past Medical History:  Diagnosis Date  . Arthritis   . Asthma   . Cancer ([place])    lung, adenocarcinoma right lung 2012  . CHF (congestive heart failure) ([facility])    Preserved EF  . Congenital single kidney    With chronic kidney disease  . COPD (chronic obstructive pulmonary disease) ([place])   . Gout   . Hypertension   . Lung cancer [facility]) 2012   Right upper lobe lung adenocarcinoma diagnosed with needle biopsy treated by SBRT finished treatment April 2013 has been monitored since  . Mass of chest wall, right    Right chest wall mass 7.3 cm biopsy on 12/13/2013. Patient notes it was consistent with sarcoidosis but actual pathology results not available.  . Oxygen deficiency   . Sarcoidosis   . Sickle cell trait ([place])   . Sleep apnea    Current Status: This will be her initial office visit with me. She was previously seeing Lanae Boast, NP for her PCP needs. Since her last office visit, she is doing well with no complaints. She has c/o right shoulder/arm pain x 1 month. She is currently taking Acetaminophen for minimal relief. She denies any trauma to area. She rates pain 4/10 today. She denies visual changes, chest pain, cough, shortness of breath, heart palpitations, and falls. She has occasional headaches and dizziness with position changes. Denies severe headaches, confusion, seizures, double vision, and blurred vision, nausea and vomiting. She denies fevers, chills, fatigue, recent infections, weight loss, and night sweats. No reports of GI problems such as diarrhea, and constipation. She has no reports of blood in stools, dysuria and hematuria. No  depression or anxiety reported today. She denies suicidal ideations, homicidal ideations, or auditory hallucinations.   Past Surgical History:  Procedure Laterality Date  . LOOP RECORDER INSERTION N/A 09/08/2018   Procedure: LOOP RECORDER INSERTION;  Surgeon: Evans Lance, MD;  Location: [place];  Service: Cardiovascular;  Laterality: N/A;  . LUNG BIOPSY    . TEE WITHOUT CARDIOVERSION N/A 09/08/2018   Procedure: TRANSESOPHAGEAL ECHOCARDIOGRAM (TEE);  Surgeon: Josue Hector, MD;  Location: [facility] ENDOSCOPY;  Service: Cardiovascular;  Laterality: N/A;  with loop  . TUBAL LIGATION    . VEIN LIGATION AND STRIPPING      Family History  Problem Relation Age of Onset  . Hypertension Mother   . Renal Disease Mother   . Cancer Father        stomach  . Heart disease Father        No details  . Cancer Brother   . Diabetes Brother   . Cervical cancer Sister   . Diabetes Sister   . Multiple myeloma Sister     Social History   Socioeconomic History  . Marital status: Divorced    Spouse name: Not on file  . Number of children: 2  . Years of education: Not on file  . Highest education level: Not on file  Occupational History  . Not on file  Tobacco Use  . Smoking status: Never Smoker  . Smokeless tobacco: Never Used  Substance and Sexual Activity  . Alcohol use: No    Alcohol/week: 0.0 standard drinks  . Drug use: No  . Sexual activity: Not Currently  Other Topics Concern  . Not on file  Social History Narrative   Lives with son.  One son is deceased.     Social Determinants of Health   Financial Resource Strain:   . Difficulty of Paying Living Expenses: Not on file  Food Insecurity:   . Worried About Charity fundraiser in the Last Year: Not on file  . Ran Out of Food in the Last Year: Not on file  Transportation Needs:   . Lack of Transportation (Medical): Not on file  . Lack of Transportation (Non-Medical): Not on file  Physical Activity:   . Days of  Exercise per Week: Not on file  . Minutes of Exercise per Session: Not on file  Stress:   . Feeling of Stress : Not on file  Social Connections:   . Frequency of Communication with Friends and Family: Not on file  . Frequency of Social Gatherings with Friends and Family: Not on file  . Attends Religious Services: Not on file  . Active Member of Clubs or Organizations: Not on file  . Attends Archivist Meetings: Not on file  . Marital Status: Not on file  Intimate Partner Violence:   . Fear of Current or Ex-Partner: Not on file  . Emotionally Abused: Not on file  . Physically Abused: Not on file  . Sexually Abused: Not on file    Outpatient Medications Prior to Visit  Medication Sig Dispense Refill  . acetaminophen (TYLENOL) 500 MG tablet Take 1,000 mg by mouth daily.     [name] Kitchen albuterol (PROAIR HFA) 108 (90 Base) MCG/ACT inhaler Inhale 2 puffs into the lungs every 6 (six) hours as needed for wheezing or shortness of breath. 3 Inhaler 1  . albuterol (PROVENTIL) (2.5 MG/3ML) 0.083% nebulizer solution Take 3 mLs (2.5 mg total) by nebulization every 6 (six) hours as needed for wheezing or shortness of breath. 100 mL 0  . aspirin 325 MG tablet Take 1 tablet (325 mg total) by mouth daily. 30 tablet 0  . atorvastatin (LIPITOR) 40 MG tablet Take 1 tablet (40 mg total) by mouth daily at 6 PM. 90 tablet 1  . budesonide-formoterol (SYMBICORT) 160-4.5 MCG/ACT inhaler Inhale 2 puffs into the lungs 2 (two) times daily. 18 g 1  . calcitRIOL (ROCALTROL) 0.25 MCG capsule Take 1 capsule (0.25 mcg total) by mouth daily. 30 capsule 0  . docusate sodium (COLACE) 100 MG capsule Take 1 capsule (100 mg total) by mouth 2 (two) times daily. 60 capsule 0  . febuxostat (ULORIC) 40 MG tablet Take 1 tablet (40 mg total) by mouth daily. 90 tablet 1  . furosemide (LASIX) 40 MG tablet Take 1 tablet (40 mg total) by mouth 2 (two) times daily. For CHF 180 tablet 1  . metolazone (ZAROXOLYN) 2.5 MG tablet Take 1  tablet (2.5 mg total) by mouth 2 (two) times a week. Take on Mondays and Thursdays 24 tablet 1  . montelukast (SINGULAIR) 10 MG tablet Take 1 tablet (10 mg total) by mouth at bedtime. 90 tablet 1  . calcium carbonate (TUMS - DOSED IN MG ELEMENTAL CALCIUM) 500 MG chewable tablet Chew 1-2 tablets by mouth daily as needed for indigestion (gas).     . potassium chloride SA (K-DUR,KLOR-CON) 20 MEQ tablet Take 1 tablet (20 mEq total) by mouth daily. (Patient  not taking: Reported on 07/18/2019) 90 tablet 1  . calcitRIOL (ROCALTROL) 0.25 MCG capsule     . SYMBICORT 160-4.5 MCG/ACT inhaler      No facility-administered medications prior to visit.    Allergies  Allergen Reactions  . Sulfur Rash    ROS Review of Systems  Constitutional: Negative.   HENT: Negative.   Eyes: Negative.   Respiratory: Negative.   Cardiovascular: Negative.   Gastrointestinal: Positive for abdominal distention.  Endocrine: Negative.   Genitourinary: Negative.   Musculoskeletal: Positive for arthralgias (generalized).       Right shoulder pain  Skin: Negative.   Allergic/Immunologic: Negative.   Neurological: Positive for dizziness (occasional ) and headaches (occasional ).  Hematological: Negative.   Psychiatric/Behavioral: Negative.    Objective:    Physical Exam  Constitutional: She is oriented to person, place, and time. She appears well-developed and well-nourished.  HENT:  Head: Normocephalic and atraumatic.  Eyes: Conjunctivae are normal.  Cardiovascular: Normal rate, regular rhythm, normal heart sounds and intact distal pulses.  Pulmonary/Chest: Effort normal. She has wheezes (auscultated in left lung lobes).  Abdominal: Soft. Bowel sounds are normal. She exhibits distension (obese).  Musculoskeletal:        General: Normal range of motion.     Cervical back: Normal range of motion and neck supple.  Neurological: She is alert and oriented to person, place, and time. She has normal reflexes.  Skin:  Skin is warm and dry.  Psychiatric: She has a normal mood and affect. Her behavior is normal. Judgment and thought content normal.  Nursing note and vitals reviewed.   BP 128/64   Pulse 68   Temp 98.2 F (36.8 C) (Oral)   Ht '5\' 6"'$  (1.676 m)   Wt (!) 351 lb 12.8 oz (159.6 kg)   SpO2 95%   BMI 56.78 kg/m  Wt Readings from Last 3 Encounters:  07/18/19 (!) 351 lb 12.8 oz (159.6 kg)  05/30/19 (!) 350 lb (158.8 kg)  03/01/19 (!) 336 lb (152.4 kg)     Health Maintenance Due  Topic Date Due  . PNA vac Low Risk Adult (2 of 2 - PPSV23) 04/14/2019    There are no preventive care reminders to display for this patient.  Lab Results  Component Value Date   TSH 3.934 05/21/2014   Lab Results  Component Value Date   WBC 5.6 01/05/2019   HGB 12.5 01/05/2019   HCT 39.4 01/05/2019   MCV 69.4 (L) 01/05/2019   PLT 142 (L) 01/05/2019   Lab Results  Component Value Date   NA 142 05/30/2019   K 3.0 (L) 05/30/2019   CHLORIDE 103 05/31/2017   CO2 29 05/30/2019   GLUCOSE 99 05/30/2019   BUN 59 (H) 05/30/2019   CREATININE 2.07 (H) 05/30/2019   BILITOT 0.2 (L) 01/05/2019   ALKPHOS 108 01/05/2019   AST 28 01/05/2019   ALT 14 01/05/2019   PROT 6.7 01/05/2019   ALBUMIN 2.8 (L) 01/05/2019   CALCIUM 8.6 (L) 05/30/2019   ANIONGAP 15 01/05/2019   EGFR 38 (L) 05/31/2017   Lab Results  Component Value Date   CHOL 130 03/01/2019   Lab Results  Component Value Date   HDL 48 03/01/2019   Lab Results  Component Value Date   LDLCALC 61 03/01/2019   Lab Results  Component Value Date   TRIG 105 03/01/2019   Lab Results  Component Value Date   CHOLHDL 2.7 03/01/2019   Lab Results  Component Value Date  HGBA1C 6.1 (H) 03/01/2019      Assessment & Plan:   1. Essential hypertension The current medical regimen is effective; blood pressure is stable at 128/64 today; she will continue present plan and medications as prescribed. She will continue to take medications as prescribed,  to decrease high sodium intake, excessive alcohol intake, increase potassium intake, smoking cessation, and increase physical activity of at least 30 minutes of cardio activity daily. She will continue to follow Heart Healthy or DASH diet. - Urinalysis Dipstick  2. Acute pain of right shoulder - DG Shoulder Right; Future  3. Abnormal urinalysis Results are pending. - Urine Culture  4. Urinary tract infection without hematuria, site unspecified We will initiate Bactrim today.  - sulfamethoxazole-trimethoprim (BACTRIM DS) 800-160 MG tablet; Take 1 tablet by mouth 2 (two) times daily.  Dispense: 20 tablet; Refill: 0  5. Class 3 severe obesity due to excess calories with serious comorbidity and body mass index (BMI) of 50.0 to 59.9 in adult [facility]) Body mass index is 56.78 kg/m.  Goal BMI  is <30. Encouraged efforts to reduce weight include engaging in physical activity as tolerated with goal of 150 minutes per week. Improve dietary choices and eat a meal regimen consistent with a Mediterranean or DASH diet. Reduce simple carbohydrates. Do not skip meals and eat healthy snacks throughout the day to avoid over-eating at dinner. Set a goal weight loss that is achievable for you.  6. Morbid (severe) obesity due to excess calories ([place])  7. Mild persistent chronic asthma without complication Stable. Wheezes auscultated in left lung lobes. No signs or symptoms of respiratory distress noted or reported.   8. Follow-up exam She will follow up in 6 months.   Meds ordered this encounter  Medications  . sulfamethoxazole-trimethoprim (BACTRIM DS) 800-160 MG tablet    Sig: Take 1 tablet by mouth 2 (two) times daily.    Dispense:  20 tablet    Refill:  0    Orders Placed This Encounter  Procedures  . Urine Culture  . DG Shoulder Right  . Urinalysis Dipstick    Referral Orders  No referral(s) requested today   Kathe Becton,  MSN, FNP-BC [place] [address] [place], [place] [address] [phone] fax  Problem List Items Addressed This Visit      Cardiovascular and Mediastinum   Essential hypertension - Primary   Relevant Orders   Urinalysis Dipstick (Completed)     Respiratory   Asthma, chronic     Other   Morbid (severe) obesity due to excess calories ([place])    Other Visit Diagnoses    Acute pain of right shoulder       Relevant Orders   DG Shoulder Right   Abnormal urinalysis       Relevant Orders   Urine Culture   Urinary tract infection without hematuria, site unspecified       Relevant Medications   sulfamethoxazole-trimethoprim (BACTRIM DS) 800-160 MG tablet   Class 3 severe obesity due to excess calories with serious comorbidity and body mass index (BMI) of 50.0 to 59.9 in adult [facility])       Follow-up exam          Meds ordered this encounter  Medications  . sulfamethoxazole-trimethoprim (BACTRIM DS) 800-160 MG tablet    Sig: Take 1 tablet by mouth 2 (two) times daily.    Dispense:  20 tablet    Refill:  0    Follow-up: Return in about 6 months (around 01/16/2020).    Azzie Glatter, FNP

## 2019-07-19 ENCOUNTER — Telehealth: Payer: Self-pay | Admitting: Family Medicine

## 2019-07-19 ENCOUNTER — Other Ambulatory Visit: Payer: Self-pay | Admitting: Family Medicine

## 2019-07-19 DIAGNOSIS — I5030 Unspecified diastolic (congestive) heart failure: Secondary | ICD-10-CM

## 2019-07-19 DIAGNOSIS — J4530 Mild persistent asthma, uncomplicated: Secondary | ICD-10-CM

## 2019-07-19 MED ORDER — BUDESONIDE-FORMOTEROL FUMARATE 160-4.5 MCG/ACT IN AERO: 2.0000 | INHALATION_SPRAY | Freq: Two times a day (BID) | RESPIRATORY_TRACT | 5 refills | Status: DC

## 2019-07-19 MED ORDER — MONTELUKAST SODIUM 10 MG PO TABS: 10.0000 mg | ORAL_TABLET | Freq: Every day | ORAL | 1 refills | Status: DC

## 2019-07-19 MED ORDER — METOLAZONE 2.5 MG PO TABS: 2.5000 mg | ORAL_TABLET | ORAL | 1 refills | Status: DC

## 2019-07-19 NOTE — Progress Notes (Signed)
Meds ordered this encounter  Medications  . budesonide-formoterol (SYMBICORT) 160-4.5 MCG/ACT inhaler    Sig: Inhale 2 puffs into the lungs 2 (two) times daily.    Dispense:  18 g    Refill:  5    Please consider 90 day supplies to promote better adherence    Order Specific Question:   Supervising Provider    Answer:   Tresa Garter [id number]  . metolazone (ZAROXOLYN) 2.5 MG tablet    Sig: Take 1 tablet (2.5 mg total) by mouth 2 (two) times a week. Take on Mondays and Thursdays    Dispense:  24 tablet    Refill:  1    Order Specific Question:   Supervising Provider    Answer:   Tresa Garter [id number]  . montelukast (SINGULAIR) 10 MG tablet    Sig: Take 1 tablet (10 mg total) by mouth at bedtime.    Dispense:  90 tablet    Refill:  1    Order Specific Question:   Supervising Provider    Answer:   Tresa Garter [[id number]]    [name]  APRN, MSN, FNP-C [facility] [address] [place], [place] [address] [phone]

## 2019-07-20 LAB — URINE CULTURE

## 2019-07-24 NOTE — Telephone Encounter (Signed)
Informed Pt to call pharmacy for refill per NP

## 2019-07-27 ENCOUNTER — Other Ambulatory Visit: Payer: Self-pay

## 2019-07-27 ENCOUNTER — Encounter (HOSPITAL_COMMUNITY): Payer: Self-pay

## 2019-07-27 ENCOUNTER — Inpatient Hospital Stay (HOSPITAL_COMMUNITY)
Admission: EM | Admit: 2019-07-27 | Discharge: 2019-08-02 | DRG: 682 | Disposition: A | Discharge status: Skilled Nursing Facility | Payer: Medicare Other | Attending: Internal Medicine | Admitting: Internal Medicine

## 2019-07-27 ENCOUNTER — Ambulatory Visit: Payer: Medicare Other | Admitting: Podiatry

## 2019-07-27 ENCOUNTER — Emergency Department (HOSPITAL_COMMUNITY): Payer: Medicare Other

## 2019-07-27 DIAGNOSIS — N183 Chronic kidney disease, stage 3 unspecified: Secondary | ICD-10-CM | POA: Diagnosis not present

## 2019-07-27 DIAGNOSIS — M25511 Pain in right shoulder: Secondary | ICD-10-CM | POA: Diagnosis not present

## 2019-07-27 DIAGNOSIS — I5033 Acute on chronic diastolic (congestive) heart failure: Secondary | ICD-10-CM | POA: Diagnosis present

## 2019-07-27 DIAGNOSIS — Z833 Family history of diabetes mellitus: Secondary | ICD-10-CM | POA: Diagnosis not present

## 2019-07-27 DIAGNOSIS — N179 Acute kidney failure, unspecified: Secondary | ICD-10-CM | POA: Diagnosis present

## 2019-07-27 DIAGNOSIS — Q600 Renal agenesis, unilateral: Secondary | ICD-10-CM

## 2019-07-27 DIAGNOSIS — D631 Anemia in chronic kidney disease: Secondary | ICD-10-CM | POA: Diagnosis present

## 2019-07-27 DIAGNOSIS — N269 Renal sclerosis, unspecified: Secondary | ICD-10-CM | POA: Diagnosis present

## 2019-07-27 DIAGNOSIS — M6281 Muscle weakness (generalized): Secondary | ICD-10-CM | POA: Diagnosis not present

## 2019-07-27 DIAGNOSIS — R0602 Shortness of breath: Secondary | ICD-10-CM | POA: Diagnosis not present

## 2019-07-27 DIAGNOSIS — I130 Hypertensive heart and chronic kidney disease with heart failure and stage 1 through stage 4 chronic kidney disease, or unspecified chronic kidney disease: Secondary | ICD-10-CM | POA: Diagnosis present

## 2019-07-27 DIAGNOSIS — R531 Weakness: Secondary | ICD-10-CM | POA: Diagnosis not present

## 2019-07-27 DIAGNOSIS — I890 Lymphedema, not elsewhere classified: Secondary | ICD-10-CM | POA: Diagnosis present

## 2019-07-27 DIAGNOSIS — Z7982 Long term (current) use of aspirin: Secondary | ICD-10-CM

## 2019-07-27 DIAGNOSIS — N1831 Chronic kidney disease, stage 3a: Secondary | ICD-10-CM | POA: Diagnosis present

## 2019-07-27 DIAGNOSIS — J449 Chronic obstructive pulmonary disease, unspecified: Secondary | ICD-10-CM | POA: Diagnosis present

## 2019-07-27 DIAGNOSIS — J45909 Unspecified asthma, uncomplicated: Secondary | ICD-10-CM | POA: Diagnosis present

## 2019-07-27 DIAGNOSIS — M2550 Pain in unspecified joint: Secondary | ICD-10-CM | POA: Diagnosis not present

## 2019-07-27 DIAGNOSIS — Z807 Family history of other malignant neoplasms of lymphoid, hematopoietic and related tissues: Secondary | ICD-10-CM

## 2019-07-27 DIAGNOSIS — R600 Localized edema: Secondary | ICD-10-CM | POA: Diagnosis not present

## 2019-07-27 DIAGNOSIS — I129 Hypertensive chronic kidney disease with stage 1 through stage 4 chronic kidney disease, or unspecified chronic kidney disease: Secondary | ICD-10-CM | POA: Diagnosis not present

## 2019-07-27 DIAGNOSIS — Z6841 Body Mass Index (BMI) 40.0 and over, adult: Secondary | ICD-10-CM | POA: Diagnosis not present

## 2019-07-27 DIAGNOSIS — Z841 Family history of disorders of kidney and ureter: Secondary | ICD-10-CM

## 2019-07-27 DIAGNOSIS — Z882 Allergy status to sulfonamides status: Secondary | ICD-10-CM

## 2019-07-27 DIAGNOSIS — U071 COVID-19: Secondary | ICD-10-CM | POA: Diagnosis not present

## 2019-07-27 DIAGNOSIS — E785 Hyperlipidemia, unspecified: Secondary | ICD-10-CM | POA: Diagnosis not present

## 2019-07-27 DIAGNOSIS — E876 Hypokalemia: Secondary | ICD-10-CM | POA: Diagnosis present

## 2019-07-27 DIAGNOSIS — I5032 Chronic diastolic (congestive) heart failure: Secondary | ICD-10-CM | POA: Diagnosis not present

## 2019-07-27 DIAGNOSIS — T368X5A Adverse effect of other systemic antibiotics, initial encounter: Secondary | ICD-10-CM | POA: Diagnosis present

## 2019-07-27 DIAGNOSIS — Z20822 Contact with and (suspected) exposure to covid-19: Secondary | ICD-10-CM | POA: Diagnosis present

## 2019-07-27 DIAGNOSIS — D509 Iron deficiency anemia, unspecified: Secondary | ICD-10-CM | POA: Diagnosis present

## 2019-07-27 DIAGNOSIS — Z8249 Family history of ischemic heart disease and other diseases of the circulatory system: Secondary | ICD-10-CM | POA: Diagnosis not present

## 2019-07-27 DIAGNOSIS — N390 Urinary tract infection, site not specified: Secondary | ICD-10-CM | POA: Diagnosis present

## 2019-07-27 DIAGNOSIS — I110 Hypertensive heart disease with heart failure: Secondary | ICD-10-CM | POA: Diagnosis not present

## 2019-07-27 DIAGNOSIS — Z7951 Long term (current) use of inhaled steroids: Secondary | ICD-10-CM

## 2019-07-27 DIAGNOSIS — Z7401 Bed confinement status: Secondary | ICD-10-CM | POA: Diagnosis not present

## 2019-07-27 DIAGNOSIS — Z79899 Other long term (current) drug therapy: Secondary | ICD-10-CM

## 2019-07-27 DIAGNOSIS — G4733 Obstructive sleep apnea (adult) (pediatric): Secondary | ICD-10-CM | POA: Diagnosis present

## 2019-07-27 DIAGNOSIS — Z85118 Personal history of other malignant neoplasm of bronchus and lung: Secondary | ICD-10-CM | POA: Diagnosis not present

## 2019-07-27 DIAGNOSIS — D573 Sickle-cell trait: Secondary | ICD-10-CM | POA: Diagnosis present

## 2019-07-27 DIAGNOSIS — R2689 Other abnormalities of gait and mobility: Secondary | ICD-10-CM | POA: Diagnosis not present

## 2019-07-27 DIAGNOSIS — D638 Anemia in other chronic diseases classified elsewhere: Secondary | ICD-10-CM | POA: Diagnosis present

## 2019-07-27 DIAGNOSIS — B961 Klebsiella pneumoniae [K. pneumoniae] as the cause of diseases classified elsewhere: Secondary | ICD-10-CM | POA: Diagnosis present

## 2019-07-27 DIAGNOSIS — M25531 Pain in right wrist: Secondary | ICD-10-CM | POA: Diagnosis not present

## 2019-07-27 DIAGNOSIS — N170 Acute kidney failure with tubular necrosis: Principal | ICD-10-CM | POA: Diagnosis present

## 2019-07-27 DIAGNOSIS — R52 Pain, unspecified: Secondary | ICD-10-CM

## 2019-07-27 LAB — BASIC METABOLIC PANEL
Anion gap: 15 (ref 5–15)
BUN: 76 mg/dL — ABNORMAL HIGH (ref 8–23)
CO2: 28 mmol/L (ref 22–32)
Calcium: 8.6 mg/dL — ABNORMAL LOW (ref 8.9–10.3)
Chloride: 93 mmol/L — ABNORMAL LOW (ref 98–111)
Creatinine, Ser: 3.56 mg/dL — ABNORMAL HIGH (ref 0.44–1.00)
GFR calc Af Amer: 15 mL/min — ABNORMAL LOW (ref >60)
GFR calc non Af Amer: 13 mL/min — ABNORMAL LOW (ref >60)
Glucose, Bld: 121 mg/dL — ABNORMAL HIGH (ref 70–99)
Potassium: 3.2 mmol/L — ABNORMAL LOW (ref 3.5–5.1)
Sodium: 136 mmol/L (ref 135–145)

## 2019-07-27 LAB — CBC
HCT: 35.7 % — ABNORMAL LOW (ref 36.0–46.0)
Hemoglobin: 11.2 g/dL — ABNORMAL LOW (ref 12.0–15.0)
MCH: 21.6 pg — ABNORMAL LOW (ref 26.0–34.0)
MCHC: 31.4 g/dL (ref 30.0–36.0)
MCV: 68.9 fL — ABNORMAL LOW (ref 80.0–100.0)
Platelets: 206 10*3/uL (ref 150–400)
RBC: 5.18 MIL/uL — ABNORMAL HIGH (ref 3.87–5.11)
RDW: 14.6 % (ref 11.5–15.5)
WBC: 9.7 10*3/uL (ref 4.0–10.5)
nRBC: 0 % (ref 0.0–0.2)

## 2019-07-27 LAB — BRAIN NATRIURETIC PEPTIDE: B Natriuretic Peptide: 64.2 pg/mL (ref 0.0–100.0)

## 2019-07-27 MED ORDER — MONTELUKAST SODIUM 10 MG PO TABS
10.0000 mg | ORAL_TABLET | Freq: Every day | ORAL | Status: DC
  Administered 2019-07-28 – 2019-08-01 (×6): 10 mg via ORAL
  Filled 2019-07-27 (×6): qty 1

## 2019-07-27 MED ORDER — SODIUM CHLORIDE 0.9% FLUSH
3.0000 mL | Freq: Two times a day (BID) | INTRAVENOUS | Status: DC
  Administered 2019-07-28 – 2019-08-02 (×10): 3 mL via INTRAVENOUS

## 2019-07-27 MED ORDER — FUROSEMIDE 10 MG/ML IJ SOLN
40.0000 mg | Freq: Once | INTRAMUSCULAR | Status: AC
  Administered 2019-07-27: 40 mg via INTRAVENOUS
  Filled 2019-07-27: qty 4

## 2019-07-27 MED ORDER — SODIUM CHLORIDE 0.9 % IV SOLN: 250.0000 mL | INTRAVENOUS | Status: DC | PRN

## 2019-07-27 MED ORDER — ATORVASTATIN CALCIUM 40 MG PO TABS
40.0000 mg | ORAL_TABLET | Freq: Every day | ORAL | Status: DC
  Administered 2019-07-28 – 2019-08-01 (×5): 40 mg via ORAL
  Filled 2019-07-27 (×5): qty 1

## 2019-07-27 MED ORDER — HYDROCODONE-ACETAMINOPHEN 5-325 MG PO TABS
1.0000 | ORAL_TABLET | ORAL | Status: DC | PRN
  Administered 2019-07-28 – 2019-07-30 (×6): 2 via ORAL
  Administered 2019-07-31: 22:00:00 1 via ORAL
  Filled 2019-07-27 (×6): qty 2
  Filled 2019-07-27: qty 1
  Filled 2019-07-27: qty 2

## 2019-07-27 MED ORDER — METOLAZONE 2.5 MG PO TABS: 2.5000 mg | ORAL_TABLET | ORAL | Status: DC

## 2019-07-27 MED ORDER — SODIUM CHLORIDE 0.9% FLUSH: 3.0000 mL | INTRAVENOUS | Status: DC | PRN

## 2019-07-27 MED ORDER — ALBUTEROL SULFATE (2.5 MG/3ML) 0.083% IN NEBU: 2.5000 mg | INHALATION_SOLUTION | Freq: Four times a day (QID) | RESPIRATORY_TRACT | Status: DC | PRN

## 2019-07-27 MED ORDER — ASPIRIN 325 MG PO TABS
325.0000 mg | ORAL_TABLET | Freq: Every day | ORAL | Status: DC
  Administered 2019-07-28 – 2019-08-02 (×6): 325 mg via ORAL
  Filled 2019-07-27 (×6): qty 1

## 2019-07-27 NOTE — H&P (Signed)
History and Physical    Era Parr KNL:[id number] DOB: 1951/05/27 DOA: 07/27/2019  PCP: Dorena Dew, FNP  Patient coming from: Home  Chief Complaint: Lower extremity swelling  HPI: Pamela [name] is a 69 y.o. female with medical history significant of chronic kidney disease with a baseline creatinine of 2, diastolic congestive heart failure, chronic edema lower extremities, hypertension, obstructive sleep apnea, obesity was diagnosed with a UTI last week and placed on Bactrim.  She is allergic to sulfa but did not know Bactrim was sulfa drug.  She took the Bactrim for 3 days and then broke out in hives during that time.  Her legs started to swell more.  She denies any fevers.  She denies any shortness of breath.  She denies any nausea vomiting or diarrhea.  She has been urinating.  Patient found to have acute kidney injury with a creatinine bump up to 3.56 from her baseline of 2.  Patient be referred for admission for acute kidney injury and lower extremity edema.  Review of Systems: As per HPI otherwise 10 point review of systems negative.   Past Medical History:  Diagnosis Date  . Arthritis   . Asthma   . Cancer ([place])    lung, adenocarcinoma right lung 2012  . CHF (congestive heart failure) (HCC)    Preserved EF  . Congenital single kidney    With chronic kidney disease  . COPD (chronic obstructive pulmonary disease) ([place])   . Gout   . Hypertension   . Lung cancer [facility]) 2012   Right upper lobe lung adenocarcinoma diagnosed with needle biopsy treated by SBRT finished treatment April 2013 has been monitored since  . Mass of chest wall, right    Right chest wall mass 7.3 cm biopsy on 12/13/2013. Patient notes it was consistent with sarcoidosis but actual pathology results not available.  . Oxygen deficiency   . Sarcoidosis   . Sickle cell trait ([place])   . Sleep apnea     Past Surgical History:  Procedure Laterality Date  . LOOP RECORDER INSERTION N/A 09/08/2018   Procedure: LOOP RECORDER INSERTION;  Surgeon: Evans Lance, MD;  Location: [facility];  Service: Cardiovascular;  Laterality: N/A;  . LUNG BIOPSY    . TEE WITHOUT CARDIOVERSION N/A 09/08/2018   Procedure: TRANSESOPHAGEAL ECHOCARDIOGRAM (TEE);  Surgeon: Josue Hector, MD;  Location: [facility] ENDOSCOPY;  Service: Cardiovascular;  Laterality: N/A;  with loop  . TUBAL LIGATION    . VEIN LIGATION AND STRIPPING       reports that she has never smoked. She has never used smokeless tobacco. She reports that she does not drink alcohol or use drugs.  Allergies  Allergen Reactions  . Sulfur Rash    Family History  Problem Relation Age of Onset  . Hypertension Mother   . Renal Disease Mother   . Cancer Father        stomach  . Heart disease Father        No details  . Cancer Brother   . Diabetes Brother   . Cervical cancer Sister   . Diabetes Sister   . Multiple myeloma Sister     Prior to Admission medications   Medication Sig Start Date End Date Taking? Authorizing Provider  acetaminophen (TYLENOL) 500 MG tablet Take 1,000 mg by mouth every 6 (six) hours as needed for mild pain.    Yes [provider]  albuterol (PROAIR HFA) 108 (90 Base) MCG/ACT inhaler Inhale 2 puffs into  the lungs every 6 (six) hours as needed for wheezing or shortness of breath. 10/13/18  Yes Lanae Boast, FNP  albuterol (PROVENTIL) (2.5 MG/3ML) 0.083% nebulizer solution Take 3 mLs (2.5 mg total) by nebulization every 6 (six) hours as needed for wheezing or shortness of breath. 09/30/18  Yes Lassen, Arlo C, PA-C  aspirin 325 MG tablet Take 1 tablet (325 mg total) by mouth daily. 09/30/18  Yes Lassen, Arlo C, PA-C  atorvastatin (LIPITOR) 40 MG tablet Take 1 tablet (40 mg total) by mouth daily at 6 PM. 04/03/19  Yes Lanae Boast, FNP  budesonide-formoterol [facility]) 160-4.5 MCG/ACT inhaler Inhale 2 puffs into the lungs 2 (two) times daily. 07/19/19  Yes Dorena Dew, FNP  calcitRIOL (ROCALTROL)  0.25 MCG capsule Take 1 capsule (0.25 mcg total) by mouth daily. 09/30/18  Yes Lassen, Arlo C, PA-C  calcium carbonate (TUMS - DOSED IN MG ELEMENTAL CALCIUM) 500 MG chewable tablet Chew 1-2 tablets by mouth daily as needed for indigestion (gas).    Yes [provider]  docusate sodium (COLACE) 100 MG capsule Take 1 capsule (100 mg total) by mouth 2 (two) times daily. 09/30/18  Yes Lassen, Arlo C, PA-C  febuxostat (ULORIC) 40 MG tablet Take 1 tablet (40 mg total) by mouth daily. 03/20/19  Yes Lanae Boast, FNP  furosemide (LASIX) 40 MG tablet Take 1 tablet (40 mg total) by mouth 2 (two) times daily. For CHF 10/13/18  Yes Lanae Boast, FNP  metolazone (ZAROXOLYN) 2.5 MG tablet Take 1 tablet (2.5 mg total) by mouth 2 (two) times a week. Take on Mondays and Thursdays 07/20/19  Yes Dorena Dew, FNP  montelukast (SINGULAIR) 10 MG tablet Take 1 tablet (10 mg total) by mouth at bedtime. 07/19/19  Yes Dorena Dew, FNP  potassium chloride SA (K-DUR,KLOR-CON) 20 MEQ tablet Take 1 tablet (20 mEq total) by mouth daily. Patient not taking: Reported on 07/18/2019 10/13/18   Lanae Boast, FNP  sulfamethoxazole-trimethoprim (BACTRIM DS) 800-160 MG tablet Take 1 tablet by mouth 2 (two) times daily. 07/18/19   Azzie Glatter, FNP    Physical Exam: Vitals:   07/27/19 1558 07/27/19 1603 07/27/19 1915 07/27/19 2136  BP: 131/81  128/72 (!) 136/102  Pulse: 75  75 80  Resp: _0 Temp: 99 F (37.2 C)     TempSrc: Oral     SpO2: 96%  100% 99%  Weight:  (!) 158.8 kg    Height:  _1  (1.676 m)        Constitutional: NAD, calm, comfortable Vitals:   07/27/19 1558 07/27/19 1603 07/27/19 1915 07/27/19 2136  BP: 131/81  128/72 (!) 136/102  Pulse: 75  75 80  Resp: _2 Temp: 99 F (37.2 C)     TempSrc: Oral     SpO2: 96%  100% 99%  Weight:  (!) 158.8 kg    Height:  _3  (1.676 m)     Eyes: PERRL, lids and conjunctivae normal ENMT: Mucous membranes are moist. Posterior  pharynx clear of any exudate or lesions.Normal dentition.  Neck: normal, supple, no masses, no thyromegaly Respiratory: clear to auscultation bilaterally, no wheezing, no crackles. Normal respiratory effort. No accessory muscle use.  Cardiovascular: Regular rate and rhythm, no murmurs / rubs / gallops.  1+ extremity edema with chronic changes to skin. 2+ pedal pulses. No carotid bruits.  Abdomen: no tenderness, no masses palpated. No hepatosplenomegaly. Bowel sounds positive.  Musculoskeletal: no clubbing / cyanosis. No  joint deformity upper and lower extremities. Good ROM, no contractures. Normal muscle tone.  Skin: no rashes, lesions, ulcers. No induration Neurologic: CN 2-12 grossly intact. Sensation intact, DTR normal. Strength 5/5 in all 4.  Psychiatric: Normal judgment and insight. Alert and oriented x 3. Normal mood.    Labs on Admission: I have personally reviewed following labs and imaging studies  CBC: Recent Labs  Lab 07/27/19 1613  WBC 9.7  HGB 11.2*  HCT 35.7*  MCV 68.9*  PLT 833   Basic Metabolic Panel: Recent Labs  Lab 07/27/19 1613  NA 136  K 3.2*  CL 93*  CO2 28  GLUCOSE 121*  BUN 76*  CREATININE 3.56*  CALCIUM 8.6*   GFR: Estimated Creatinine Clearance: 24.3 mL/min (A) (by C-G formula based on SCr of 3.56 mg/dL (H)). Liver Function Tests: No results for input(s): AST, ALT, ALKPHOS, BILITOT, PROT, ALBUMIN in the last 168 hours. No results for input(s): LIPASE, AMYLASE in the last 168 hours. No results for input(s): AMMONIA in the last 168 hours. Coagulation Profile: No results for input(s): INR, PROTIME in the last 168 hours. Cardiac Enzymes: No results for input(s): CKTOTAL, CKMB, CKMBINDEX, TROPONINI in the last 168 hours. BNP (last 3 results) No results for input(s): PROBNP in the last 8760 hours. HbA1C: No results for input(s): HGBA1C in the last 72 hours. CBG: No results for input(s): GLUCAP in the last 168 hours. Lipid Profile: No results  for input(s): CHOL, HDL, LDLCALC, TRIG, CHOLHDL, LDLDIRECT in the last 72 hours. Thyroid Function Tests: No results for input(s): TSH, T4TOTAL, FREET4, T3FREE, THYROIDAB in the last 72 hours. Anemia Panel: No results for input(s): VITAMINB12, FOLATE, FERRITIN, TIBC, IRON, RETICCTPCT in the last 72 hours. Urine analysis:    Component Value Date/Time   COLORURINE YELLOW 01/02/2019 1430   APPEARANCEUR CLEAR 01/02/2019 1430   LABSPEC 1.013 01/02/2019 1430   PHURINE 6.0 01/02/2019 1430   GLUCOSEU NEGATIVE 01/02/2019 1430   HGBUR NEGATIVE 01/02/2019 1430   BILIRUBINUR Negative 07/18/2019 1126   KETONESUR NEGATIVE 01/02/2019 1430   PROTEINUR Negative 07/18/2019 1126   PROTEINUR NEGATIVE 01/02/2019 1430   UROBILINOGEN 0.2 07/18/2019 1126   UROBILINOGEN 0.2 09/24/2017 1143   NITRITE Negative 07/18/2019 1126   NITRITE NEGATIVE 01/02/2019 1430   LEUKOCYTESUR Small (1+) (A) 07/18/2019 1126   LEUKOCYTESUR NEGATIVE 01/02/2019 1430   Sepsis Labs: !!!!!!!!!!!!!!!!!!!!!!!!!!!!!!!!!!!!!!!!!!!! _0 (procalcitonin:4,lacticidven:4) ) Recent Results (from the past 240 hour(s))  Urine Culture     Status: Abnormal   Collection Time: 07/18/19 12:24 PM   Specimen: Urine   UR  Result Value Ref Range Status   Urine Culture, Routine Final report (A)  Final   Organism ID, Bacteria Comment (A)  Final    Comment: Raoultella ornithinolytica Greater than 100,000 colony forming units per mL    Antimicrobial Susceptibility Comment  Final    Comment:       ** S = Susceptible; I = Intermediate; R = Resistant **                    P = Positive; N = Negative             MICS are expressed in micrograms per mL    Antibiotic                 RSLT#1    RSLT#2    RSLT#3    RSLT#4 Amoxicillin/Clavulanic Acid    S Ampicillin  R Cefuroxime                     S Ciprofloxacin                  S Gentamicin                     S Imipenem                       S Meropenem                       S Nitrofurantoin                 S Tetracycline                   S Tobramycin                     S Trimethoprim/Sulfa             S      Radiological Exams on Admission: DG Chest Port 1 View  Result Date: 07/27/2019 CLINICAL DATA:  Foot pain and swelling with shortness of breath EXAM: PORTABLE CHEST 1 VIEW COMPARISON:  01/02/2019 FINDINGS: Cardiac shadow is stable. Mild vascular congestion is seen although improved from the prior exam. Loop recorder is again noted. No focal infiltrate is seen. IMPRESSION: Mild central vascular congestion but improved from the prior study. Electronically Signed   By: Inez Catalina M.D.   On: 07/27/2019 21:08   Old chart reviewed Chest x-ray reviewed some mild edema Case discussed with EDP  Assessment/Plan 69 year old female with recent UTI on Bactrim with sulfa allergy comes in with acute kidney injury and worsening lower extremity edema Principal Problem:   Acute renal failure superimposed on stage 3 chronic kidney disease (HCC)-ongoing to hold her Bactrim this is all likely due to ATN from Bactrim use.  She is been given Lasix 40 mg IV in the emergency department.  I am going to Ace wrap her legs if she can tolerate that and give her some Norco for the pain.  Would not increase her Lasix dosing any further secondary to acute kidney injury.  Hopefully elevating her legs through the night and wrapping them will help and will give her kidney some time to recover from the Bactrim use before increasing her diuretics.  Reports adequate urinary output.  Monitor closely.  Also hold off on IV fluids at this time secondary to her edema.  Active Problems:   Hypokalemia-replete potassium check mag level.  Repeat potassium level in the morning    Morbid (severe) obesity due to excess calories (HCC)-stable    Anemia of chronic disease-stable at baseline    Asthma, chronic-stable    Chronic diastolic CHF (congestive heart failure) (HCC)-stable as above   DVT  prophylaxis: SCDs Code Status: Full Family Communication: None Disposition Plan: Days Consults called: None Admission status: Admission   Lovena Kluck A MD [facility]  If 7PM-7AM, please contact night-coverage [email or web address] Password [facility]  07/27/2019, 10:27 PM

## 2019-07-27 NOTE — ED Notes (Signed)
Pt was undressed and placed in hospital gown.  Purewick placed on pt with assistance of RN Joi. Pt has call light within reach as well as phone.

## 2019-07-27 NOTE — ED Provider Notes (Signed)
[facility] DEPT Provider Note   CSN: [id number] Arrival date & time: 07/27/19  1547     History Chief Complaint  Patient presents with  . Foot Pain  . Foot Swelling    Pamela [name] is a 69 y.o. female.  69 yo F with a chief complaints of bilateral lower extremity edema.  This is worsened over the past week or so To the point where she can barely get up and move around due to the weight of her legs.  She has been taking her Lasix as prescribed and feels that she is gotten adequate output.  Has had some mild decreased intake at home.  She thinks that the leg swelling is from taking Bactrim for urinary tract infection.  States that when she started taking that that was when she started having significant swelling.  She feels that she has had a significant weight gain though was not sure how much.  Denies any significant shortness of breath.  No cough or chest pain.  The history is provided by the patient.  Foot Pain Pertinent negatives include no chest pain, no headaches and no shortness of breath.  Illness Severity:  Moderate Onset quality:  Gradual Duration:  1 week Timing:  Constant Progression:  Worsening Chronicity:  New Associated symptoms: no chest pain, no congestion, no fever, no headaches, no myalgias, no nausea, no rhinorrhea, no shortness of breath, no vomiting and no wheezing        Past Medical History:  Diagnosis Date  . Arthritis   . Asthma   . Cancer ([place])    lung, adenocarcinoma right lung 2012  . CHF (congestive heart failure) (HCC)    Preserved EF  . Congenital single kidney    With chronic kidney disease  . COPD (chronic obstructive pulmonary disease) ([place])   . Gout   . Hypertension   . Lung cancer [facility]) 2012   Right upper lobe lung adenocarcinoma diagnosed with needle biopsy treated by SBRT finished treatment April 2013 has been monitored since  . Mass of chest wall, right    Right chest wall mass 7.3 cm biopsy on  12/13/2013. Patient notes it was consistent with sarcoidosis but actual pathology results not available.  . Oxygen deficiency   . Sarcoidosis   . Sickle cell trait ([place])   . Sleep apnea     Patient Active Problem List   Diagnosis Date Noted  . COVID-19 virus infection 01/02/2019  . Right knee pain 09/15/2018  . Closed head injury with concussion 09/06/2018  . Acute CVA (cerebrovascular accident) ([place]) 09/06/2018  . Hypokalemia 09/06/2018  . Hypomagnesemia 09/06/2018  . Asthma, chronic obstructive, with acute exacerbation ([place]) 08/02/2018  . Plantar fasciitis 04/14/2017  . Microcytosis 11/28/2015  . 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 Onychomycosis 01/09/2015  . Ingrown nail 01/09/2015  . Pain in lower limb 01/09/2015  . Chronic respiratory failure with hypoxia and hypercapnia ([place]) 07/19/2014  . CHF (congestive heart failure) ([place]) 06/11/2014  . Anemia, iron deficiency 05/27/2014  . Acute gouty arthritis 05/25/2014  . Acute renal failure superimposed on stage 3 chronic kidney disease ([place]) 05/25/2014  . Obstructive sleep apnea 05/25/2014  . Joint pain 05/24/2014  . Chronic diastolic CHF (congestive heart failure) ([place])   . Sarcoidosis   . Metabolic syndrome 95/32/0233  . Asthma, chronic 04/27/2014  . Anemia of chronic disease 04/19/2014  . Morbid (severe) obesity due to excess calories ([place]) 04/18/2014  . Immunization due 04/18/2014  . Need for prophylactic  vaccination and inoculation against influenza 04/18/2014  . Prediabetes 04/13/2014  . Essential hypertension 04/13/2014  . Lower extremity edema 04/13/2014  . History of lung cancer 04/13/2014    Past Surgical History:  Procedure Laterality Date  . LOOP RECORDER INSERTION N/A 09/08/2018   Procedure: LOOP RECORDER INSERTION;  Surgeon: Evans Lance, MD;  Location: [facility];  Service: Cardiovascular;  Laterality: N/A;  . LUNG BIOPSY    . TEE WITHOUT CARDIOVERSION N/A 09/08/2018   Procedure: TRANSESOPHAGEAL  ECHOCARDIOGRAM (TEE);  Surgeon: Josue Hector, MD;  Location: [facility] ENDOSCOPY;  Service: Cardiovascular;  Laterality: N/A;  with loop  . TUBAL LIGATION    . VEIN LIGATION AND STRIPPING       OB History    Gravida  2   Para      Term      Preterm      AB      Living  1     SAB      TAB      Ectopic      Multiple      Live Births              Family History  Problem Relation Age of Onset  . Hypertension Mother   . Renal Disease Mother   . Cancer Father        stomach  . Heart disease Father        No details  . Cancer Brother   . Diabetes Brother   . Cervical cancer Sister   . Diabetes Sister   . Multiple myeloma Sister     Social History   Tobacco Use  . Smoking status: Never Smoker  . Smokeless tobacco: Never Used  Substance Use Topics  . Alcohol use: No    Alcohol/week: 0.0 standard drinks  . Drug use: No    Home Medications Prior to Admission medications   Medication Sig Start Date End Date Taking? Authorizing Provider  acetaminophen (TYLENOL) 500 MG tablet Take 1,000 mg by mouth every 6 (six) hours as needed for mild pain.    Yes [provider]  albuterol (PROAIR HFA) 108 (90 Base) MCG/ACT inhaler Inhale 2 puffs into the lungs every 6 (six) hours as needed for wheezing or shortness of breath. 10/13/18  Yes Lanae Boast, FNP  albuterol (PROVENTIL) (2.5 MG/3ML) 0.083% nebulizer solution Take 3 mLs (2.5 mg total) by nebulization every 6 (six) hours as needed for wheezing or shortness of breath. 09/30/18  Yes Lassen, Arlo C, PA-C  aspirin 325 MG tablet Take 1 tablet (325 mg total) by mouth daily. 09/30/18  Yes Lassen, Arlo C, PA-C  atorvastatin (LIPITOR) 40 MG tablet Take 1 tablet (40 mg total) by mouth daily at 6 PM. 04/03/19  Yes Lanae Boast, FNP  budesonide-formoterol [facility]) 160-4.5 MCG/ACT inhaler Inhale 2 puffs into the lungs 2 (two) times daily. 07/19/19  Yes Dorena Dew, FNP  calcitRIOL (ROCALTROL) 0.25 MCG capsule Take 1  capsule (0.25 mcg total) by mouth daily. 09/30/18  Yes Lassen, Arlo C, PA-C  calcium carbonate (TUMS - DOSED IN MG ELEMENTAL CALCIUM) 500 MG chewable tablet Chew 1-2 tablets by mouth daily as needed for indigestion (gas).    Yes [provider]  docusate sodium (COLACE) 100 MG capsule Take 1 capsule (100 mg total) by mouth 2 (two) times daily. 09/30/18  Yes Lassen, Arlo C, PA-C  febuxostat (ULORIC) 40 MG tablet Take 1 tablet (40 mg total) by mouth daily. 03/20/19  Yes Lanae Boast, FNP  furosemide (LASIX) 40 MG tablet Take 1 tablet (40 mg total) by mouth 2 (two) times daily. For CHF 10/13/18  Yes Lanae Boast, FNP  metolazone (ZAROXOLYN) 2.5 MG tablet Take 1 tablet (2.5 mg total) by mouth 2 (two) times a week. Take on Mondays and Thursdays 07/20/19  Yes Dorena Dew, FNP  montelukast (SINGULAIR) 10 MG tablet Take 1 tablet (10 mg total) by mouth at bedtime. 07/19/19  Yes Dorena Dew, FNP  potassium chloride SA (K-DUR,KLOR-CON) 20 MEQ tablet Take 1 tablet (20 mEq total) by mouth daily. Patient not taking: Reported on 07/18/2019 10/13/18   Lanae Boast, FNP  sulfamethoxazole-trimethoprim (BACTRIM DS) 800-160 MG tablet Take 1 tablet by mouth 2 (two) times daily. 07/18/19   Azzie Glatter, FNP    Allergies    Sulfur  Review of Systems   Review of Systems  Constitutional: Negative for chills and fever.  HENT: Negative for congestion and rhinorrhea.   Eyes: Negative for redness and visual disturbance.  Respiratory: Negative for shortness of breath and wheezing.   Cardiovascular: Positive for leg swelling. Negative for chest pain and palpitations.  Gastrointestinal: Negative for nausea and vomiting.  Genitourinary: Negative for dysuria and urgency.  Musculoskeletal: Negative for arthralgias and myalgias.  Skin: Negative for pallor and wound.  Neurological: Negative for dizziness and headaches.    Physical Exam Updated Vital Signs BP (!) 136/102 (BP Location: Right Arm)    Pulse 80   Temp 99 F (37.2 C) (Oral)   Resp 18   Ht '5\' 6"'  (1.676 m)   Wt (!) 158.8 kg   SpO2 99%   BMI 56.49 kg/m   Physical Exam Vitals and nursing note reviewed.  Constitutional:      General: She is not in acute distress.    Appearance: She is well-developed. She is obese. She is not diaphoretic.  HENT:     Head: Normocephalic and atraumatic.  Eyes:     Pupils: Pupils are equal, round, and reactive to light.  Cardiovascular:     Rate and Rhythm: Normal rate and regular rhythm.     Heart sounds: No murmur. No friction rub. No gallop.   Pulmonary:     Effort: Pulmonary effort is normal.     Breath sounds: No wheezing or rales.  Abdominal:     General: There is no distension.     Palpations: Abdomen is soft.     Tenderness: There is no abdominal tenderness.  Musculoskeletal:        General: No tenderness.     Cervical back: Normal range of motion and neck supple.     Right lower leg: Edema present.     Left lower leg: Edema present.     Comments: 4+ to BLE, some chronic skin changes.  Skin:    General: Skin is warm and dry.  Neurological:     Mental Status: She is alert and oriented to person, place, and time.  Psychiatric:        Behavior: Behavior normal.     ED Results / Procedures / Treatments   Labs (all labs ordered are listed, but only abnormal results are displayed) Labs Reviewed  CBC - Abnormal; Notable for the following components:      Result Value   RBC 5.18 (*)    Hemoglobin 11.2 (*)    HCT 35.7 (*)    MCV 68.9 (*)    MCH 21.6 (*)    All other components  within normal limits  BASIC METABOLIC PANEL - Abnormal; Notable for the following components:   Potassium 3.2 (*)    Chloride 93 (*)    Glucose, Bld 121 (*)    BUN 76 (*)    Creatinine, Ser 3.56 (*)    Calcium 8.6 (*)    GFR calc non Af Amer 13 (*)    GFR calc Af Amer 15 (*)    All other components within normal limits  SARS CORONAVIRUS 2 (TAT 6-24 HRS)  URINALYSIS, ROUTINE W REFLEX  MICROSCOPIC  BRAIN NATRIURETIC PEPTIDE    EKG EKG Interpretation  Date/Time:  Thursday July 27 2019 21:22:25 EST Ventricular Rate:  75 PR Interval:    QRS Duration: 107 QT Interval:  416 QTC Calculation: 465 R Axis:   -60 Text Interpretation: Sinus rhythm Probable left atrial enlargement Left anterior fascicular block Abnormal R-wave progression, late transition Left ventricular hypertrophy No significant change since last tracing Confirmed by Deno Etienne [phone]) on 07/27/2019 9:36:26 PM   Radiology DG Chest Port 1 View  Result Date: 07/27/2019 CLINICAL DATA:  Foot pain and swelling with shortness of breath EXAM: PORTABLE CHEST 1 VIEW COMPARISON:  01/02/2019 FINDINGS: Cardiac shadow is stable. Mild vascular congestion is seen although improved from the prior exam. Loop recorder is again noted. No focal infiltrate is seen. IMPRESSION: Mild central vascular congestion but improved from the prior study. Electronically Signed   By: Inez Catalina M.D.   On: 07/27/2019 21:08    Procedures Procedures (including critical care time)  Medications Ordered in ED Medications  furosemide (LASIX) injection 40 mg (40 mg Intravenous Given 07/27/19 2115)    ED Course  I have reviewed the triage vital signs and the nursing notes.  Pertinent labs & imaging results that were available during my care of the patient were reviewed by me and considered in my medical decision making (see chart for details).    MDM Rules/Calculators/A&P                      69 yo F with a chief complaints of worsening bilateral lower extremity edema.  Going on for about a week.  Coinciding with a course of Bactrim.  She feels that it was likely an allergic reaction.  Patient has a significant renal injury, GFR was essentially halved  from about 1.5 mos ago.  Patient tells me she has had significant disability with his leg edema took her 5 hours to get out of the house to go to her doctor's office today.  With her renal  dysfunction and she will likely be difficult to diurese will discuss with hospitalist.  The patients results and plan were reviewed and discussed.   Any x-rays performed were independently reviewed by myself.   Differential diagnosis were considered with the presenting HPI.  Medications  furosemide (LASIX) injection 40 mg (40 mg Intravenous Given 07/27/19 2115)    Vitals:   07/27/19 1558 07/27/19 1603 07/27/19 1915 07/27/19 2136  BP: 131/81  128/72 (!) 136/102  Pulse: 75  75 80  Resp: '18  20 18  ' Temp: 99 F (37.2 C)     TempSrc: Oral     SpO2: 96%  100% 99%  Weight:  (!) 158.8 kg    Height:  '5\' 6"'  (1.676 m)      Final diagnoses:  Acute on chronic diastolic congestive heart failure (HCC)  AKI (acute kidney injury) ([place])    Admission/ observation were discussed with the admitting  physician, patient and/or family and they are comfortable with the plan.    Final Clinical Impression(s) / ED Diagnoses Final diagnoses:  Acute on chronic diastolic congestive heart failure ([place])  AKI (acute kidney injury) [facility])    Rx / DC Orders ED Discharge Orders    None       Deno Etienne, DO 07/27/19 2213

## 2019-07-27 NOTE — ED Triage Notes (Signed)
Patient c/o bilateral foot pain and swelling R/L x 3 days. Patient states she had an allergic reaction to SMZ/TMP DS 800-160 on 07/20/19 . Patient states she had taken for 3 days. Patient states she had N/V, headache, and hives.

## 2019-07-28 LAB — BASIC METABOLIC PANEL
Anion gap: 14 (ref 5–15)
BUN: 76 mg/dL — ABNORMAL HIGH (ref 8–23)
CO2: 30 mmol/L (ref 22–32)
Calcium: 8.2 mg/dL — ABNORMAL LOW (ref 8.9–10.3)
Chloride: 92 mmol/L — ABNORMAL LOW (ref 98–111)
Creatinine, Ser: 3.06 mg/dL — ABNORMAL HIGH (ref 0.44–1.00)
GFR calc Af Amer: 18 mL/min — ABNORMAL LOW (ref >60)
GFR calc non Af Amer: 15 mL/min — ABNORMAL LOW (ref >60)
Glucose, Bld: 142 mg/dL — ABNORMAL HIGH (ref 70–99)
Potassium: 3.1 mmol/L — ABNORMAL LOW (ref 3.5–5.1)
Sodium: 136 mmol/L (ref 135–145)

## 2019-07-28 LAB — CBC
HCT: 31.6 % — ABNORMAL LOW (ref 36.0–46.0)
Hemoglobin: 10.2 g/dL — ABNORMAL LOW (ref 12.0–15.0)
MCH: 22.2 pg — ABNORMAL LOW (ref 26.0–34.0)
MCHC: 32.3 g/dL (ref 30.0–36.0)
MCV: 68.7 fL — ABNORMAL LOW (ref 80.0–100.0)
Platelets: 187 10*3/uL (ref 150–400)
RBC: 4.6 MIL/uL (ref 3.87–5.11)
RDW: 14.5 % (ref 11.5–15.5)
WBC: 8.4 10*3/uL (ref 4.0–10.5)
nRBC: 0 % (ref 0.0–0.2)

## 2019-07-28 LAB — SARS CORONAVIRUS 2 (TAT 6-24 HRS): SARS Coronavirus 2: NEGATIVE

## 2019-07-28 LAB — POTASSIUM: Potassium: 3.8 mmol/L (ref 3.5–5.1)

## 2019-07-28 MED ORDER — SODIUM CHLORIDE 0.9 % IV SOLN
1.0000 g | INTRAVENOUS | Status: DC
  Administered 2019-07-28 – 2019-07-31 (×4): 1 g via INTRAVENOUS
  Filled 2019-07-28 (×4): qty 1

## 2019-07-28 MED ORDER — POTASSIUM CHLORIDE 20 MEQ PO PACK
40.0000 meq | PACK | Freq: Once | ORAL | Status: AC
  Administered 2019-07-28: 11:00:00 40 meq via ORAL
  Filled 2019-07-28: qty 2

## 2019-07-28 MED ORDER — POTASSIUM CHLORIDE 20 MEQ PO PACK
40.0000 meq | PACK | Freq: Once | ORAL | Status: AC
  Administered 2019-07-28: 16:00:00 40 meq via ORAL
  Filled 2019-07-28: qty 2

## 2019-07-28 NOTE — Progress Notes (Signed)
PROGRESS NOTE    Yarielys Beed  FUX:[id number] DOB: 05-Oct-1950 DOA: 07/27/2019 PCP: Dorena Dew, FNP   Brief Narrative: Arieon Scalzo is a 69 y.o. female with medical history significant of chronic kidney disease with a baseline creatinine of 2, diastolic congestive heart failure, chronic edema lower extremities, hypertension, obstructive sleep apnea, obesity was diagnosed with a UTI last week and placed on Bactrim.  She is allergic to sulfa but did not know Bactrim was sulfa drug.  She took the Bactrim for 3 days and then broke out in hives during that time.  Her legs started to swell more.  She denies any fevers.  She denies any shortness of breath.  She denies any nausea vomiting or diarrhea.  She has been urinating.  Patient found to have acute kidney injury with a creatinine bump up to 3.56 from her baseline of 2.  Patient be referred for admission for acute kidney injury and lower extremity edema.  Her creatinine is improving.  Nephrology consulted given history of CHF and acute kidney injury.   Assessment & Plan:   Principal Problem:   Acute renal failure superimposed on stage 3 chronic kidney disease (HCC) Active Problems:   Morbid (severe) obesity due to excess calories (HCC)   Anemia of chronic disease   Asthma, chronic   Chronic diastolic CHF (congestive heart failure) (HCC)   Hypokalemia    # Acute renal failure superimposed on stage 3 chronic kidney disease ([place]) -ongoing to hold her Bactrim this is all likely due to ATN from Bactrim use.   She has been given Lasix 40 mg IV in the emergency department.  we are going to Ace wrap her legs if she can tolerate that and given her some Norco for the pain.  Would not increase her Lasix dosing any further secondary to acute kidney injury.  Hopefully elevating her legs through the night and wrapping them will help and will give her kidney some time to recover from the Bactrim use before increasing her diuretics.  Reports adequate  urinary output.  Monitor closely.  Also hold off on IV fluids at this time secondary to her edema. Nephrology consulted, will follow up recommendation.   # Hypokalemia- replete potassium check mag level.  Repeat potassium level in the morning    # Morbid (severe) obesity due to excess calories ([place])- stable    # Anemia of chronic disease-  stable at baseline    # Asthma, chronic-   stable    # Chronic diastolic CHF (congestive heart failure) (HCC)-stable as above  #  UTI : Start ceftriaxone , follow-up urine cultures   DVT prophylaxis: SCDS Code Status: Full Family Communication:  Discussed with patient in detail. Disposition Plan: remains inpatient.  Consultants:    None  Procedures: None Antimicrobials:  Anti-infectives (From admission, onward)   Start     Dose/Rate Route Frequency Ordered Stop   07/28/19 1000  cefTRIAXone (ROCEPHIN) 1 g in sodium chloride 0.9 % 100 mL IVPB     1 g 200 mL/hr over 30 Minutes Intravenous Every 24 hours 07/28/19 0925        Subjective: Patient was seen and examined at bedside, she is lying comfortably, denies any complaints.  Objective: Vitals:   07/28/19 0000 07/28/19 0136 07/28/19 0626 07/28/19 1427  BP: (!) 102/56 101/60 (!) 106/52 (!) 119/52  Pulse: 79 79 81 78  Resp: 19 17 17 20   Temp:  98.7 F (37.1 C) 98.2 F (36.8 C) 98.8 F (37.1  C)  TempSrc:  Oral Oral Oral  SpO2: 97% 100% 93% 97%  Weight:  (!) 155.7 kg    Height:        Intake/Output Summary (Last 24 hours) at 07/28/2019 1504 Last data filed at 07/28/2019 0900 Gross per 24 hour  Intake 483 ml  Output 1050 ml  Net -567 ml   Filed Weights   07/27/19 1603 07/28/19 0136  Weight: (!) 158.8 kg (!) 155.7 kg    Examination:  General exam: Appears calm and comfortable  Respiratory system: Clear to auscultation. Respiratory effort normal. Cardiovascular system: S1 & S2 heard, RRR. No JVD, murmurs, rubs, gallops or clicks. No pedal edema. Gastrointestinal system:  Abdomen is nondistended, soft and nontender. No organomegaly or masses felt. Normal bowel sounds heard. Central nervous system: Alert and oriented. No focal neurological deficits. Extremities: Significant bilateral leg edema. Skin: No rashes, lesions or ulcers Psychiatry: Judgement and insight appear normal. Mood & affect appropriate.     Data Reviewed: I have personally reviewed following labs and imaging studies  CBC: Recent Labs  Lab 07/27/19 1613 07/28/19 0447  WBC 9.7 8.4  HGB 11.2* 10.2*  HCT 35.7* 31.6*  MCV 68.9* 68.7*  PLT 206 606   Basic Metabolic Panel: Recent Labs  Lab 07/27/19 1613 07/28/19 0447  NA 136 136  K 3.2* 3.1*  CL 93* 92*  CO2 28 30  GLUCOSE 121* 142*  BUN 76* 76*  CREATININE 3.56* 3.06*  CALCIUM 8.6* 8.2*   GFR: Estimated Creatinine Clearance: 27.9 mL/min (A) (by C-G formula based on SCr of 3.06 mg/dL (H)). Liver Function Tests: No results for input(s): AST, ALT, ALKPHOS, BILITOT, PROT, ALBUMIN in the last 168 hours. No results for input(s): LIPASE, AMYLASE in the last 168 hours. No results for input(s): AMMONIA in the last 168 hours. Coagulation Profile: No results for input(s): INR, PROTIME in the last 168 hours. Cardiac Enzymes: No results for input(s): CKTOTAL, CKMB, CKMBINDEX, TROPONINI in the last 168 hours. BNP (last 3 results) No results for input(s): PROBNP in the last 8760 hours. HbA1C: No results for input(s): HGBA1C in the last 72 hours. CBG: No results for input(s): GLUCAP in the last 168 hours. Lipid Profile: No results for input(s): CHOL, HDL, LDLCALC, TRIG, CHOLHDL, LDLDIRECT in the last 72 hours. Thyroid Function Tests: No results for input(s): TSH, T4TOTAL, FREET4, T3FREE, THYROIDAB in the last 72 hours. Anemia Panel: No results for input(s): VITAMINB12, FOLATE, FERRITIN, TIBC, IRON, RETICCTPCT in the last 72 hours. Sepsis Labs: No results for input(s): PROCALCITON, LATICACIDVEN in the last 168 hours.  Recent  Results (from the past 240 hour(s))  SARS CORONAVIRUS 2 (TAT 6-24 HRS) Nasopharyngeal Nasopharyngeal Swab     Status: None   Collection Time: 07/27/19  9:37 PM   Specimen: Nasopharyngeal Swab  Result Value Ref Range Status   SARS Coronavirus 2 NEGATIVE NEGATIVE Final    Comment: (NOTE) SARS-CoV-2 target nucleic acids are NOT DETECTED. The SARS-CoV-2 RNA is generally detectable in upper and lower respiratory specimens during the acute phase of infection. Negative results do not preclude SARS-CoV-2 infection, do not rule out co-infections with other pathogens, and should not be used as the sole basis for treatment or other patient management decisions. Negative results must be combined with clinical observations, patient history, and epidemiological information. The expected result is Negative. Fact Sheet for Patients: SugarRoll.be Fact Sheet for Healthcare Providers: https://www.woods-mathews.com/ This test is not yet approved or cleared by the [place] and  has been authorized  for detection and/or diagnosis of SARS-CoV-2 by FDA under an Emergency Use Authorization (EUA). This EUA will remain  in effect (meaning this test can be used) for the duration of the COVID-19 declaration under Section 56 4(b)(1) of the Act, 21 U.S.C. section 360bbb-3(b)(1), unless the authorization is terminated or revoked sooner. Performed at [facility], [place] [address] [place], [place] [address]      Radiology Studies: DG Chest Port 1 View  Result Date: 07/27/2019 CLINICAL DATA:  Foot pain and swelling with shortness of breath EXAM: PORTABLE CHEST 1 VIEW COMPARISON:  01/02/2019 FINDINGS: Cardiac shadow is stable. Mild vascular congestion is seen although improved from the prior exam. Loop recorder is again noted. No focal infiltrate is seen. IMPRESSION: Mild central vascular congestion but improved from the prior study. Electronically Signed   By:  Inez Catalina M.D.   On: 07/27/2019 21:08   Scheduled Meds: . aspirin  325 mg Oral Daily  . atorvastatin  40 mg Oral q1800  . montelukast  10 mg Oral QHS  . sodium chloride flush  3 mL Intravenous Q12H   Continuous Infusions: . sodium chloride    . cefTRIAXone (ROCEPHIN)  IV 1 g (07/28/19 1439)     LOS: 1 day    Time spent: 25 mins    Gary Gabrielsen, MD [facility]   If 7PM-7AM, please contact night-coverage

## 2019-07-28 NOTE — Consult Note (Signed)
[facility] Renal Consultation Note  Requesting MD: Shawna Clamp, MD Indication for Consultation:  AKI on CKD  Chief complaint: leg swelling  HPI:  Bostyn Kunkler is a 69 y.o. female with a history of CKD, chronic CHF with preserved EF, hypertension, OSA who presented to the hospital with leg swelling.  She states that she was given Bactrim for "an infection".  She thought this was an infection in her chest but per charting may have been a UTI.  She had issues with hives after taking the Bactrim and noted leg swelling.  She then had a lot of n/v. She has not had any shortness of breath.  She states that her swelling is actually better than when she was admitted.  Her renal failure worsened from a baseline creatinine of 1.6-2.0 in [facility] labs up to 3.56 as noted below.  Improving today.  Nephrology is consulted for assistance with management of AKI.  She previously followed with Dr. Jimmy Footman and has just established care with Dr. Marval Regal.  She is on a regimen of 40 lasix twice daily for her CHF and also takes metolazone which is charted as 2.5 mg twice per week.  She has family members with kidney disease including a brother who required dialysis for ESRD and is thankful that her kidney function is improving  Creatinine  Date/Time Value Ref Range Status  09/20/2018 12:00 AM 1.4 (A) 0.5 - 1.1 Final  06/23/2018 01:00 PM 1.66 (H) 0.44 - 1.00 mg/dL Final  05/31/2017 10:50 AM 1.6 (H) 0.6 - 1.1 mg/dL Final  11/26/2016 08:08 AM 1.7 (H) 0.6 - 1.1 mg/dL Final  05/26/2016 08:22 AM 1.4 (H) 0.6 - 1.1 mg/dL Final  11/28/2015 09:58 AM 1.7 (H) 0.6 - 1.1 mg/dL Final  05/09/2015 11:27 AM 1.5 (H) 0.6 - 1.1 mg/dL Final   Creat  Date/Time Value Ref Range Status  05/19/2017 12:10 PM 1.53 (H) 0.50 - 0.99 mg/dL Final    Comment:    For patients >63 years of age, the reference limit for Creatinine is approximately 13% higher for people identified as African-American. [name] Kitchen   02/16/2017 12:00 PM  1.40 (H) 0.50 - 0.99 mg/dL Final    Comment:      For patients > or = 67 years of age: The upper reference limit for Creatinine is approximately 13% higher for people identified as African-American.     11/03/2016 12:02 PM 1.67 (H) 0.50 - 0.99 mg/dL Final    Comment:      For patients > or = 67 years of age: The upper reference limit for Creatinine is approximately 13% higher for people identified as African-American.     08/04/2016 11:02 AM 1.59 (H) 0.50 - 0.99 mg/dL Final    Comment:      For patients > or = 67 years of age: The upper reference limit for Creatinine is approximately 13% higher for people identified as African-American.     02/21/2016 09:49 AM 1.58 (H) 0.50 - 0.99 mg/dL Final    Comment:      For patients > or = 67 years of age: The upper reference limit for Creatinine is approximately 13% higher for people identified as African-American.     01/02/2016 09:13 AM 1.50 (H) 0.50 - 0.99 mg/dL Final    Comment:      For patients > or = 67 years of age: The upper reference limit for Creatinine is approximately 13% higher for people identified as African-American.     10/08/2015 08:48 AM  1.58 (H) 0.50 - 0.99 mg/dL Final  07/16/2015 10:37 AM 1.44 (H) 0.50 - 0.99 mg/dL Final  04/16/2015 12:22 PM 1.32 (H) 0.50 - 0.99 mg/dL Final  01/02/2015 11:42 AM 1.23 (H) 0.50 - 1.10 mg/dL Final  10/03/2014 04:28 PM 1.37 (H) 0.50 - 1.10 mg/dL Final  08/23/2014 02:38 PM 1.70 (H) 0.50 - 1.10 mg/dL Final  07/05/2014 04:43 PM 1.60 (H) 0.50 - 1.10 mg/dL Final  05/21/2014 05:16 PM CANCELED 0.50 - 1.10 mg/dL     Comment:    Result canceled by the ancillary  04/13/2014 12:34 PM 1.88 (H) 0.50 - 1.10 mg/dL Final   Creatinine, Ser  Date/Time Value Ref Range Status  07/28/2019 04:47 AM 3.06 (H) 0.44 - 1.00 mg/dL Final  07/27/2019 04:13 PM 3.56 (H) 0.44 - 1.00 mg/dL Final  05/30/2019 12:04 PM 2.07 (H) 0.57 - 1.00 mg/dL Final  01/05/2019 04:00 AM 1.60 (H) 0.44 - 1.00 mg/dL Final   01/04/2019 03:00 AM 1.66 (H) 0.44 - 1.00 mg/dL Final  01/03/2019 02:33 AM 1.73 (H) 0.44 - 1.00 mg/dL Final  01/02/2019 10:45 AM 2.31 (H) 0.44 - 1.00 mg/dL Final  09/13/2018 12:03 AM 1.64 (H) 0.44 - 1.00 mg/dL Final  09/07/2018 11:40 AM 1.60 (H) 0.44 - 1.00 mg/dL Final  09/06/2018 05:56 AM 1.97 (H) 0.44 - 1.00 mg/dL Final  09/01/2018 12:50 PM 1.61 (H) 0.44 - 1.00 mg/dL Final  03/14/2018 12:07 PM 1.67 (H) 0.57 - 1.00 mg/dL Final  12/09/2017 08:56 AM 1.61 (H) 0.60 - 1.10 mg/dL Final  09/24/2017 12:02 PM 1.44 (H) 0.57 - 1.00 mg/dL Final  04/08/2016 08:50 AM 1.70 (H) 0.44 - 1.00 mg/dL Final  06/15/2014 04:48 AM 1.41 (H) 0.50 - 1.10 mg/dL Final  06/14/2014 05:25 AM 1.35 (H) 0.50 - 1.10 mg/dL Final  06/13/2014 05:25 AM 1.54 (H) 0.50 - 1.10 mg/dL Final  06/12/2014 04:11 AM 1.57 (H) 0.50 - 1.10 mg/dL Final  06/11/2014 07:29 PM 1.51 (H) 0.50 - 1.10 mg/dL Final  05/28/2014 04:45 AM 1.88 (H) 0.50 - 1.10 mg/dL Final  05/27/2014 05:45 AM 1.79 (H) 0.50 - 1.10 mg/dL Final  05/26/2014 05:24 AM 2.10 (H) 0.50 - 1.10 mg/dL Final  05/25/2014 08:18 AM 2.07 (H) 0.50 - 1.10 mg/dL Final  05/24/2014 07:00 AM 2.68 (H) 0.50 - 1.10 mg/dL Final  05/24/2014 12:30 AM 2.96 (H) 0.50 - 1.10 mg/dL Final     PMHx:   Past Medical History:  Diagnosis Date  . Arthritis   . Asthma   . Cancer ([place])    lung, adenocarcinoma right lung 2012  . CHF (congestive heart failure) (HCC)    Preserved EF  . Congenital single kidney    With chronic kidney disease  . COPD (chronic obstructive pulmonary disease) ([place])   . Gout   . Hypertension   . Lung cancer [facility]) 2012   Right upper lobe lung adenocarcinoma diagnosed with needle biopsy treated by SBRT finished treatment April 2013 has been monitored since  . Mass of chest wall, right    Right chest wall mass 7.3 cm biopsy on 12/13/2013. Patient notes it was consistent with sarcoidosis but actual pathology results not available.  . Oxygen deficiency   . Sarcoidosis   . Sickle  cell trait ([place])   . Sleep apnea     Past Surgical History:  Procedure Laterality Date  . LOOP RECORDER INSERTION N/A 09/08/2018   Procedure: LOOP RECORDER INSERTION;  Surgeon: Evans Lance, MD;  Location: [employer] CV LAB;  Service: Cardiovascular;  Laterality: N/A;  .  LUNG BIOPSY    . TEE WITHOUT CARDIOVERSION N/A 09/08/2018   Procedure: TRANSESOPHAGEAL ECHOCARDIOGRAM (TEE);  Surgeon: Josue Hector, MD;  Location: [facility] ENDOSCOPY;  Service: Cardiovascular;  Laterality: N/A;  with loop  . TUBAL LIGATION    . VEIN LIGATION AND STRIPPING      Family Hx:  Family History  Problem Relation Age of Onset  . Hypertension Mother   . Renal Disease Mother   . Cancer Father        stomach  . Heart disease Father        No details  . Cancer Brother   . Diabetes Brother   . Cervical cancer Sister   . Diabetes Sister   . Multiple myeloma Sister    Extensive kidney disease on her mother's side per the patient, her mother and maternal grandmother.  Note that her brother was actually ESRD on dialysis   Social History:  reports that she has never smoked. She has never used smokeless tobacco. She reports that she does not drink alcohol or use drugs.  Allergies:  Allergies  Allergen Reactions  . Sulfur Rash    Medications: Prior to Admission medications   Medication Sig Start Date End Date Taking? Authorizing Provider  acetaminophen (TYLENOL) 500 MG tablet Take 1,000 mg by mouth every 6 (six) hours as needed for mild pain.    Yes [provider]  albuterol (PROAIR HFA) 108 (90 Base) MCG/ACT inhaler Inhale 2 puffs into the lungs every 6 (six) hours as needed for wheezing or shortness of breath. 10/13/18  Yes Lanae Boast, FNP  albuterol (PROVENTIL) (2.5 MG/3ML) 0.083% nebulizer solution Take 3 mLs (2.5 mg total) by nebulization every 6 (six) hours as needed for wheezing or shortness of breath. 09/30/18  Yes Lassen, Arlo C, PA-C  aspirin 325 MG tablet Take 1 tablet (325 mg total)  by mouth daily. 09/30/18  Yes Lassen, Arlo C, PA-C  atorvastatin (LIPITOR) 40 MG tablet Take 1 tablet (40 mg total) by mouth daily at 6 PM. 04/03/19  Yes Lanae Boast, FNP  budesonide-formoterol [facility]) 160-4.5 MCG/ACT inhaler Inhale 2 puffs into the lungs 2 (two) times daily. 07/19/19  Yes Dorena Dew, FNP  calcitRIOL (ROCALTROL) 0.25 MCG capsule Take 1 capsule (0.25 mcg total) by mouth daily. 09/30/18  Yes Lassen, Arlo C, PA-C  calcium carbonate (TUMS - DOSED IN MG ELEMENTAL CALCIUM) 500 MG chewable tablet Chew 1-2 tablets by mouth daily as needed for indigestion (gas).    Yes [provider]  docusate sodium (COLACE) 100 MG capsule Take 1 capsule (100 mg total) by mouth 2 (two) times daily. 09/30/18  Yes Lassen, Arlo C, PA-C  febuxostat (ULORIC) 40 MG tablet Take 1 tablet (40 mg total) by mouth daily. 03/20/19  Yes Lanae Boast, FNP  furosemide (LASIX) 40 MG tablet Take 1 tablet (40 mg total) by mouth 2 (two) times daily. For CHF 10/13/18  Yes Lanae Boast, FNP  metolazone (ZAROXOLYN) 2.5 MG tablet Take 1 tablet (2.5 mg total) by mouth 2 (two) times a week. Take on Mondays and Thursdays 07/20/19  Yes Dorena Dew, FNP  montelukast (SINGULAIR) 10 MG tablet Take 1 tablet (10 mg total) by mouth at bedtime. 07/19/19  Yes Dorena Dew, FNP  potassium chloride SA (K-DUR,KLOR-CON) 20 MEQ tablet Take 1 tablet (20 mEq total) by mouth daily. Patient not taking: Reported on 07/18/2019 10/13/18   Lanae Boast, FNP  sulfamethoxazole-trimethoprim (BACTRIM DS) 800-160 MG tablet Take 1 tablet by mouth 2 (  two) times daily. 07/18/19   Azzie Glatter, FNP    I have reviewed the patient's current medications.  Labs:  BMP Latest Ref Rng & Units 07/28/2019 07/27/2019 05/30/2019  Glucose 70 - 99 mg/dL 142(H) 121(H) 99  BUN 8 - 23 mg/dL 76(H) 76(H) 59(H)  Creatinine 0.44 - 1.00 mg/dL 3.06(H) 3.56(H) 2.07(H)  BUN/Creat Ratio 12 - 28 - - 29(H)  Sodium 135 - 145 mmol/L 136 136 142   Potassium 3.5 - 5.1 mmol/L 3.1(L) 3.2(L) 3.0(L)  Chloride 98 - 111 mmol/L 92(L) 93(L) 96  CO2 22 - 32 mmol/L '30 28 29  ' Calcium 8.9 - 10.3 mg/dL 8.2(L) 8.6(L) 8.6(L)    Urinalysis    Component Value Date/Time   COLORURINE YELLOW 01/02/2019 1430   APPEARANCEUR CLEAR 01/02/2019 1430   LABSPEC 1.013 01/02/2019 1430   PHURINE 6.0 01/02/2019 1430   GLUCOSEU NEGATIVE 01/02/2019 1430   HGBUR NEGATIVE 01/02/2019 1430   BILIRUBINUR Negative 07/18/2019 1126   KETONESUR NEGATIVE 01/02/2019 1430   PROTEINUR Negative 07/18/2019 1126   PROTEINUR NEGATIVE 01/02/2019 1430   UROBILINOGEN 0.2 07/18/2019 1126   UROBILINOGEN 0.2 09/24/2017 1143   NITRITE Negative 07/18/2019 1126   NITRITE NEGATIVE 01/02/2019 1430   LEUKOCYTESUR Small (1+) (A) 07/18/2019 1126   LEUKOCYTESUR NEGATIVE 01/02/2019 1430     ROS:  Pertinent items noted in HPI and remainder of comprehensive ROS otherwise negative.  Physical Exam: Vitals:   07/28/19 0626 07/28/19 1427  BP: (!) 106/52 (!) 119/52  Pulse: 81 78  Resp: 17 20  Temp: 98.2 F (36.8 C) 98.8 F (37.1 C)  SpO2: 93% 97%     General: Adult female in bed in no acute distress at rest HEENT: Normocephalic atraumatic Eyes: Extraocular movements intact sclera anicteric Neck: Supple trachea midline Heart: S1-S2 no rub appreciated Lungs: Lungs clear but reduced on auscultation normal work of breathing at rest, on room air Abdomen: Softly distended/obese habitus nontender Extremities: Trace to 1+ edema with obese habitus Neuro: Alert and oriented x3 provides a history and follows commands Psych normal mood and affect  Assessment/Plan:  # AKI  - Felt secondary to bactrim as well as pre-renal insults  - update UA and up/cr ratio  - Hold diuretics for now  - would defer additional bactrim   # CKD stage III - baseline Cr 1.6 - 2.0 - borderline III/IV - 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

## 2019-07-29 ENCOUNTER — Inpatient Hospital Stay (HOSPITAL_COMMUNITY): Payer: Medicare Other

## 2019-07-29 LAB — URINALYSIS, COMPLETE (UACMP) WITH MICROSCOPIC
Bilirubin Urine: NEGATIVE
Glucose, UA: NEGATIVE mg/dL
Hgb urine dipstick: NEGATIVE
Ketones, ur: NEGATIVE mg/dL
Leukocytes,Ua: NEGATIVE
Nitrite: NEGATIVE
Protein, ur: NEGATIVE mg/dL
Specific Gravity, Urine: 1.011 (ref 1.005–1.030)
pH: 6 (ref 5.0–8.0)

## 2019-07-29 LAB — COMPREHENSIVE METABOLIC PANEL
ALT: 34 U/L (ref 0–44)
AST: 27 U/L (ref 15–41)
Albumin: 2.4 g/dL — ABNORMAL LOW (ref 3.5–5.0)
Alkaline Phosphatase: 217 U/L — ABNORMAL HIGH (ref 38–126)
Anion gap: 11 (ref 5–15)
BUN: 65 mg/dL — ABNORMAL HIGH (ref 8–23)
CO2: 30 mmol/L (ref 22–32)
Calcium: 8.1 mg/dL — ABNORMAL LOW (ref 8.9–10.3)
Chloride: 95 mmol/L — ABNORMAL LOW (ref 98–111)
Creatinine, Ser: 2.25 mg/dL — ABNORMAL HIGH (ref 0.44–1.00)
GFR calc Af Amer: 26 mL/min — ABNORMAL LOW (ref >60)
GFR calc non Af Amer: 22 mL/min — ABNORMAL LOW (ref >60)
Glucose, Bld: 112 mg/dL — ABNORMAL HIGH (ref 70–99)
Potassium: 3.8 mmol/L (ref 3.5–5.1)
Sodium: 136 mmol/L (ref 135–145)
Total Bilirubin: 1.1 mg/dL (ref 0.3–1.2)
Total Protein: 6.3 g/dL — ABNORMAL LOW (ref 6.5–8.1)

## 2019-07-29 LAB — CBC
HCT: 31.1 % — ABNORMAL LOW (ref 36.0–46.0)
Hemoglobin: 10 g/dL — ABNORMAL LOW (ref 12.0–15.0)
MCH: 22.4 pg — ABNORMAL LOW (ref 26.0–34.0)
MCHC: 32.2 g/dL (ref 30.0–36.0)
MCV: 69.6 fL — ABNORMAL LOW (ref 80.0–100.0)
Platelets: 218 10*3/uL (ref 150–400)
RBC: 4.47 MIL/uL (ref 3.87–5.11)
RDW: 14.7 % (ref 11.5–15.5)
WBC: 7.1 10*3/uL (ref 4.0–10.5)
nRBC: 0 % (ref 0.0–0.2)

## 2019-07-29 LAB — PROTEIN / CREATININE RATIO, URINE
Creatinine, Urine: 75.97 mg/dL
Protein Creatinine Ratio: 0.12 mg/mg{Cre} (ref 0.00–0.15)
Total Protein, Urine: 9 mg/dL

## 2019-07-29 LAB — MAGNESIUM: Magnesium: 1.8 mg/dL (ref 1.7–2.4)

## 2019-07-29 LAB — PHOSPHORUS: Phosphorus: 3.4 mg/dL (ref 2.5–4.6)

## 2019-07-29 MED ORDER — POTASSIUM CHLORIDE CRYS ER 20 MEQ PO TBCR
20.0000 meq | EXTENDED_RELEASE_TABLET | Freq: Once | ORAL | Status: AC
  Administered 2019-07-29: 12:00:00 20 meq via ORAL
  Filled 2019-07-29: qty 1

## 2019-07-29 MED ORDER — FUROSEMIDE 40 MG PO TABS
40.0000 mg | ORAL_TABLET | Freq: Two times a day (BID) | ORAL | Status: DC
  Administered 2019-07-29 – 2019-08-02 (×8): 40 mg via ORAL
  Filled 2019-07-29 (×8): qty 1

## 2019-07-29 NOTE — Progress Notes (Signed)
PROGRESS NOTE    Pamela [name]  ZOX:[id number] DOB: 10-Dec-1950 DOA: 07/27/2019 PCP: Dorena Dew, FNP   Brief Narrative: Pamela [name] is a 69 y.o. female with medical history significant of chronic kidney disease with a baseline creatinine of 2, diastolic congestive heart failure, chronic edema lower extremities, hypertension, obstructive sleep apnea, obesity was diagnosed with a UTI last week and placed on Bactrim.  She is allergic to sulfa but did not know Bactrim was sulfa drug.  She took the Bactrim for 3 days and then broke out in hives during that time.  Her legs started to swell more.  She denies any fevers.  She denies any shortness of breath.  She denies any nausea vomiting or diarrhea.  She has been urinating.  Patient found to have acute kidney injury with a creatinine bump up to 3.56 from her baseline of 2.  Patient be referred for admission for acute kidney injury and lower extremity edema.  Her creatinine is improving.  Nephrology consulted given history of CHF and acute kidney injury.  Assessment & Plan:   Principal Problem:   Acute renal failure superimposed on stage 3 chronic kidney disease (HCC) Active Problems:   Morbid (severe) obesity due to excess calories (HCC)   Anemia of chronic disease   Asthma, chronic   Chronic diastolic CHF (congestive heart failure) (HCC)   Hypokalemia   # Acute renal failure superimposed on stage 3 chronic kidney disease ([place]) Improving and backing to normal. -ongoing to hold her Bactrim this is all likely due to ATN from Bactrim use.   She has been given Lasix 40 mg IV in the emergency department.  we are going to Ace wrap her legs if she can tolerate that and given her some Norco for the pain.  Would not increase her Lasix dosing any further secondary to acute kidney injury.  Hopefully elevating her legs through the night and wrapping them will help and will give her kidney some time to recover from the Bactrim use before increasing her  diuretics.  Reports adequate urinary output.  Monitor closely.  Also hold off on IV fluids at this time secondary to her edema. Nephrology consulted, Zaroxolyn discontinued.   # Hypokalemia- resolved . replete potassium check mag level.  Repeat potassium level in the morning    # Morbid (severe) obesity due to excess calories ([place])- stable    # Anemia of chronic disease-  stable at baseline    # Asthma, chronic-   stable    # Chronic diastolic CHF (congestive heart failure) (HCC)-stable as above  #  UTI : Start ceftriaxone , follow-up urine cultures   DVT prophylaxis: SCDS Code Status: Full Family Communication:  Discussed with patient in detail. Disposition Plan: Anticipated discharge in 1 to 2 days  Consultants:    None  Procedures: None Antimicrobials:  Anti-infectives (From admission, onward)   Start     Dose/Rate Route Frequency Ordered Stop   07/28/19 1000  cefTRIAXone (ROCEPHIN) 1 g in sodium chloride 0.9 % 100 mL IVPB     1 g 200 mL/hr over 30 Minutes Intravenous Every 24 hours 07/28/19 0925        Subjective: Patient was seen and examined at bedside, she is lying comfortably, complains of right shoulder and right wrist pain, denies any trauma, asks for x-ray.  Objective: Vitals:   07/28/19 0626 07/28/19 1427 07/28/19 2104 07/29/19 0505  BP: (!) 106/52 (!) 119/52 (!) 119/57 115/72  Pulse: 81 78 70 71  Resp: 17 20 16 16   Temp: 98.2 F (36.8 C) 98.8 F (37.1 C) 98.6 F (37 C) 98.7 F (37.1 C)  TempSrc: Oral Oral Oral Oral  SpO2: 93% 97% 95% 98%  Weight:      Height:        Intake/Output Summary (Last 24 hours) at 07/29/2019 1330 Last data filed at 07/29/2019 0513 Gross per 24 hour  Intake 460.26 ml  Output 600 ml  Net -139.74 ml   Filed Weights   07/27/19 1603 07/28/19 0136  Weight: (!) 158.8 kg (!) 155.7 kg    Examination:  General exam: Appears calm and comfortable  Respiratory system: Clear to auscultation. Respiratory effort  normal. Cardiovascular system: S1 & S2 heard, RRR. No JVD, murmurs, rubs, gallops or clicks. No pedal edema. Gastrointestinal system: Abdomen is nondistended, soft and nontender. No organomegaly or masses felt. Normal bowel sounds heard. Central nervous system: Alert and oriented. No focal neurological deficits. Extremities: Significant bilateral leg edema.  Right shoulder tenderness. Skin: No rashes, lesions or ulcers Psychiatry: Judgement and insight appear normal. Mood & affect appropriate.    Data Reviewed: I have personally reviewed following labs and imaging studies  CBC: Recent Labs  Lab 07/27/19 1613 07/28/19 0447 07/29/19 0534  WBC 9.7 8.4 7.1  HGB 11.2* 10.2* 10.0*  HCT 35.7* 31.6* 31.1*  MCV 68.9* 68.7* 69.6*  PLT 206 187 119   Basic Metabolic Panel: Recent Labs  Lab 07/27/19 1613 07/28/19 0447 07/28/19 2038 07/29/19 0534  NA 136 136  --  136  K 3.2* 3.1* 3.8 3.8  CL 93* 92*  --  95*  CO2 28 30  --  30  GLUCOSE 121* 142*  --  112*  BUN 76* 76*  --  65*  CREATININE 3.56* 3.06*  --  2.25*  CALCIUM 8.6* 8.2*  --  8.1*  MG  --   --   --  1.8  PHOS  --   --   --  3.4   GFR: Estimated Creatinine Clearance: 38 mL/min (A) (by C-G formula based on SCr of 2.25 mg/dL (H)). Liver Function Tests: Recent Labs  Lab 07/29/19 0534  AST 27  ALT 34  ALKPHOS 217*  BILITOT 1.1  PROT 6.3*  ALBUMIN 2.4*   No results for input(s): LIPASE, AMYLASE in the last 168 hours. No results for input(s): AMMONIA in the last 168 hours. Coagulation Profile: No results for input(s): INR, PROTIME in the last 168 hours. Cardiac Enzymes: No results for input(s): CKTOTAL, CKMB, CKMBINDEX, TROPONINI in the last 168 hours. BNP (last 3 results) No results for input(s): PROBNP in the last 8760 hours. HbA1C: No results for input(s): HGBA1C in the last 72 hours. CBG: No results for input(s): GLUCAP in the last 168 hours. Lipid Profile: No results for input(s): CHOL, HDL, LDLCALC, TRIG,  CHOLHDL, LDLDIRECT in the last 72 hours. Thyroid Function Tests: No results for input(s): TSH, T4TOTAL, FREET4, T3FREE, THYROIDAB in the last 72 hours. Anemia Panel: No results for input(s): VITAMINB12, FOLATE, FERRITIN, TIBC, IRON, RETICCTPCT in the last 72 hours. Sepsis Labs: No results for input(s): PROCALCITON, LATICACIDVEN in the last 168 hours.  Recent Results (from the past 240 hour(s))  SARS CORONAVIRUS 2 (TAT 6-24 HRS) Nasopharyngeal Nasopharyngeal Swab     Status: None   Collection Time: 07/27/19  9:37 PM   Specimen: Nasopharyngeal Swab  Result Value Ref Range Status   SARS Coronavirus 2 NEGATIVE NEGATIVE Final    Comment: (NOTE) SARS-CoV-2 target nucleic acids  are NOT DETECTED. The SARS-CoV-2 RNA is generally detectable in upper and lower respiratory specimens during the acute phase of infection. Negative results do not preclude SARS-CoV-2 infection, do not rule out co-infections with other pathogens, and should not be used as the sole basis for treatment or other patient management decisions. Negative results must be combined with clinical observations, patient history, and epidemiological information. The expected result is Negative. Fact Sheet for Patients: SugarRoll.be Fact Sheet for Healthcare Providers: https://www.woods-mathews.com/ This test is not yet approved or cleared by the [place] FDA and  has been authorized for detection and/or diagnosis of SARS-CoV-2 by FDA under an Emergency Use Authorization (EUA). This EUA will remain  in effect (meaning this test can be used) for the duration of the COVID-19 declaration under Section 56 4(b)(1) of the Act, 21 U.S.C. section 360bbb-3(b)(1), unless the authorization is terminated or revoked sooner. Performed at [facility], [place] [address] [place], [place] [address]      Radiology Studies: DG Chest Port 1 View  Result Date: 07/27/2019 CLINICAL DATA:  Foot pain  and swelling with shortness of breath EXAM: PORTABLE CHEST 1 VIEW COMPARISON:  01/02/2019 FINDINGS: Cardiac shadow is stable. Mild vascular congestion is seen although improved from the prior exam. Loop recorder is again noted. No focal infiltrate is seen. IMPRESSION: Mild central vascular congestion but improved from the prior study. Electronically Signed   By: Inez Catalina M.D.   On: 07/27/2019 21:08   Scheduled Meds: . aspirin  325 mg Oral Daily  . atorvastatin  40 mg Oral q1800  . furosemide  40 mg Oral BID  . montelukast  10 mg Oral QHS  . sodium chloride flush  3 mL Intravenous Q12H   Continuous Infusions: . sodium chloride    . cefTRIAXone (ROCEPHIN)  IV 1 g (07/29/19 1011)     LOS: 2 days    Time spent: 25 mins    Jasmyn Picha, MD [facility]   If 7PM-7AM, please contact night-coverage

## 2019-07-29 NOTE — Progress Notes (Signed)
Pamela [name] Progress Note   69 y/o woman CKD3 (BL Cr 1.6-2)  followed by Dr Marval Regal with [facility], CHF w/ preserved EF, HTN, OSA p/w leg swelling and AKI having been on Bactrim for a UTI. On Lasix 40 BID and metolazone 2.5mg  BIW.  Assessment/ Plan:   1) AKI  - Felt secondary to bactrim as well as pre-renal insults  - update UA and up/cr ratio -> 0.12 UPC - would defer additional bactrim   - Will restart oral Lasix and gently replete the K -> restart home metolazone regimen upon d/c.  - Will sign off at this time -> reconsult as needed. Will need f/u with [facility] Dr. Marval Regal in the next 1-2 months.  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 Main complain is pain in the extremities.   Objective:   BP 115/72 (BP Location: Right Arm)   Pulse 71   Temp 98.7 F (37.1 C) (Oral)   Resp 16   Ht 5\' 6"  (1.676 m)   Wt (!) 155.7 kg   SpO2 98%   BMI 55.40 kg/m   Intake/Output Summary (Last 24 hours) at 07/29/2019 1111 Last data filed at 07/29/2019 1749 Gross per 24 hour  Intake 460.26 ml  Output 600 ml  Net -139.74 ml   Weight change:   Physical Exam: General: Adult female NAD Heart: S1-S2 no rub appreciated Lungs: CTA B/L no rales Abdomen: Softly distended/obese habitus nontender Extremities: Trace to 1+ edema with obese habitus, right leg is wrapped, tender Neuro: Alert and oriented x3 provides a history and follows commands Psych normal mood and affect  Imaging: DG Chest Port 1 View  Result Date: 07/27/2019 CLINICAL DATA:  Foot pain and swelling with shortness of breath EXAM: PORTABLE CHEST 1 VIEW COMPARISON:  01/02/2019 FINDINGS:  Cardiac shadow is stable. Mild vascular congestion is seen although improved from the prior exam. Loop recorder is again noted. No focal infiltrate is seen. IMPRESSION: Mild central vascular congestion but improved from the prior study. Electronically Signed   By: Inez Catalina M.D.   On: 07/27/2019 21:08    Labs: BMET Recent Labs  Lab 07/27/19 1613 07/28/19 0447 07/28/19 2038 07/29/19 0534  NA 136 136  --  136  K 3.2* 3.1* 3.8 3.8  CL 93* 92*  --  95*  CO2 28 30  --  30  GLUCOSE 121* 142*  --  112*  BUN 76* 76*  --  65*  CREATININE 3.56* 3.06*  --  2.25*  CALCIUM 8.6* 8.2*  --  8.1*  PHOS  --   --   --  3.4   CBC Recent Labs  Lab 07/27/19 1613 07/28/19 0447 07/29/19 0534  WBC 9.7 8.4 7.1  HGB 11.2* 10.2* 10.0*  HCT 35.7* 31.6* 31.1*  MCV 68.9* 68.7* 69.6*  PLT 206 187 218    Medications:    . aspirin  325 mg Oral Daily  . atorvastatin  40 mg Oral q1800  . montelukast  10 mg Oral QHS  . sodium chloride flush  3 mL Intravenous Q12H      Otelia Santee, MD 07/29/2019,  11:11 AM

## 2019-07-30 LAB — BASIC METABOLIC PANEL
Anion gap: 11 (ref 5–15)
BUN: 57 mg/dL — ABNORMAL HIGH (ref 8–23)
CO2: 30 mmol/L (ref 22–32)
Calcium: 8.2 mg/dL — ABNORMAL LOW (ref 8.9–10.3)
Chloride: 93 mmol/L — ABNORMAL LOW (ref 98–111)
Creatinine, Ser: 1.8 mg/dL — ABNORMAL HIGH (ref 0.44–1.00)
GFR calc Af Amer: 33 mL/min — ABNORMAL LOW (ref >60)
GFR calc non Af Amer: 29 mL/min — ABNORMAL LOW (ref >60)
Glucose, Bld: 108 mg/dL — ABNORMAL HIGH (ref 70–99)
Potassium: 3.8 mmol/L (ref 3.5–5.1)
Sodium: 134 mmol/L — ABNORMAL LOW (ref 135–145)

## 2019-07-30 LAB — CBC
HCT: 31.8 % — ABNORMAL LOW (ref 36.0–46.0)
Hemoglobin: 10 g/dL — ABNORMAL LOW (ref 12.0–15.0)
MCH: 21.9 pg — ABNORMAL LOW (ref 26.0–34.0)
MCHC: 31.4 g/dL (ref 30.0–36.0)
MCV: 69.7 fL — ABNORMAL LOW (ref 80.0–100.0)
Platelets: 254 10*3/uL (ref 150–400)
RBC: 4.56 MIL/uL (ref 3.87–5.11)
RDW: 14.9 % (ref 11.5–15.5)
WBC: 6.8 10*3/uL (ref 4.0–10.5)
nRBC: 0 % (ref 0.0–0.2)

## 2019-07-30 LAB — MAGNESIUM: Magnesium: 1.9 mg/dL (ref 1.7–2.4)

## 2019-07-30 NOTE — Progress Notes (Addendum)
PROGRESS NOTE    Pamela [name]  CVE:[id number] DOB: Aug 12, 1950 DOA: 07/27/2019 PCP: Dorena Dew, FNP   Brief Narrative: Pamela [name] is a 69 y.o. female with medical history significant of chronic kidney disease with a baseline creatinine of 2, diastolic congestive heart failure, chronic edema lower extremities, hypertension, obstructive sleep apnea, obesity was diagnosed with a UTI last week and placed on Bactrim.  She is allergic to sulfa but did not know Bactrim was sulfa drug.  She took the Bactrim for 3 days and then broke out in hives during that time.  Her legs started to swell more.  She denies any fevers.  She denies any shortness of breath.  She denies any nausea vomiting or diarrhea.  She has been urinating.  Patient found to have acute kidney injury with a creatinine bump up to 3.56 from her baseline of 2.  Patient be referred for admission for acute kidney injury and lower extremity edema.  Her creatinine is improving.  Nephrology consulted given history of CHF and acute kidney injury.  Assessment & Plan:   Principal Problem:   Acute renal failure superimposed on stage 3 chronic kidney disease (HCC) Active Problems:   Morbid (severe) obesity due to excess calories (HCC)   Anemia of chronic disease   Asthma, chronic   Chronic diastolic CHF (congestive heart failure) (HCC)   Hypokalemia   # Acute renal failure superimposed on stage 3 chronic kidney disease ([place]) Improving and backing to normal. -ongoing to hold her Bactrim this is all likely due to ATN from Bactrim use.   She has been given Lasix 40 mg IV in the emergency department.  we are going to Ace wrap her legs if she can tolerate that and given her some Norco for the pain.  Would not increase her Lasix dosing any further secondary to acute kidney injury.  Hopefully elevating her legs through the night and wrapping them will help and will give her kidney some time to recover from the Bactrim use before increasing her  diuretics.  Reports adequate urinary output.  Monitor closely.  Also hold off on IV fluids at this time secondary to her edema. Nephrology consulted, Zaroxolyn discontinued. Lasix restarted, renal functions improving.   # Hypokalemia- resolved . replete potassium,  check mag level.  Repeat potassium level in the morning    # Morbid (severe) obesity due to excess calories ([place])- stable    # Anemia of chronic disease-  stable at baseline    # Asthma, chronic-   stable    # Chronic diastolic CHF (congestive heart failure) (HCC)-stable as above  #  UTI : Continue ceftriaxone ,  Urine culture grew Klebsiella sensitive to augmentin.   DVT prophylaxis: SCDS Code Status: Full Family Communication:  Discussed with patient in detail. Disposition Plan: Anticipated discharge in 1 to 2 days  Consultants:    None  Procedures: None Antimicrobials:  Anti-infectives (From admission, onward)   Start     Dose/Rate Route Frequency Ordered Stop   07/28/19 1000  cefTRIAXone (ROCEPHIN) 1 g in sodium chloride 0.9 % 100 mL IVPB     1 g 200 mL/hr over 30 Minutes Intravenous Every 24 hours 07/28/19 0925        Subjective: Patient was seen and examined at bedside, She is lying comfortably, complains of right shoulder and right wrist pain, denies any trauma, x-ray right shoulder reports could be degenerative changes versus rotator cuff tear.  Objective: Vitals:   07/29/19 1339 07/29/19 2115  07/30/19 0529 07/30/19 1338  BP: (!) 103/52 (!) 122/52 124/66 111/66  Pulse: 72 73 67 67  Resp: 20 17 18 18   Temp: 98.9 F (37.2 C) 100 F (37.8 C) (!) 97.3 F (36.3 C) 98.2 F (36.8 C)  TempSrc: Oral Oral Oral Oral  SpO2: 95% 93% 94% 97%  Weight:      Height:        Intake/Output Summary (Last 24 hours) at 07/30/2019 1453 Last data filed at 07/30/2019 1000 Gross per 24 hour  Intake 580 ml  Output 1700 ml  Net -1120 ml   Filed Weights   07/27/19 1603 07/28/19 0136  Weight: (!) 158.8 kg (!)  155.7 kg    Examination:  General exam: Appears calm and comfortable  Respiratory system: Clear to auscultation. Respiratory effort normal. Cardiovascular system: S1 & S2 heard, RRR. No JVD, murmurs, rubs, gallops or clicks. No pedal edema. Gastrointestinal system: Abdomen is nondistended, soft and nontender. No organomegaly or masses felt. Normal bowel sounds heard. Central nervous system: Alert and oriented. No focal neurological deficits. Extremities: Significant bilateral leg edema.  Right shoulder tenderness. Skin: No rashes, lesions or ulcers Psychiatry: Judgement and insight appear normal. Mood & affect appropriate.    Data Reviewed: I have personally reviewed following labs and imaging studies  CBC: Recent Labs  Lab 07/27/19 1613 07/28/19 0447 07/29/19 0534 07/30/19 0543  WBC 9.7 8.4 7.1 6.8  HGB 11.2* 10.2* 10.0* 10.0*  HCT 35.7* 31.6* 31.1* 31.8*  MCV 68.9* 68.7* 69.6* 69.7*  PLT 206 187 218 202   Basic Metabolic Panel: Recent Labs  Lab 07/27/19 1613 07/28/19 0447 07/28/19 2038 07/29/19 0534 07/30/19 0543  NA 136 136  --  136 134*  K 3.2* 3.1* 3.8 3.8 3.8  CL 93* 92*  --  95* 93*  CO2 28 30  --  30 30  GLUCOSE 121* 142*  --  112* 108*  BUN 76* 76*  --  65* 57*  CREATININE 3.56* 3.06*  --  2.25* 1.80*  CALCIUM 8.6* 8.2*  --  8.1* 8.2*  MG  --   --   --  1.8 1.9  PHOS  --   --   --  3.4  --    GFR: Estimated Creatinine Clearance: 47.5 mL/min (A) (by C-G formula based on SCr of 1.8 mg/dL (H)). Liver Function Tests: Recent Labs  Lab 07/29/19 0534  AST 27  ALT 34  ALKPHOS 217*  BILITOT 1.1  PROT 6.3*  ALBUMIN 2.4*   No results for input(s): LIPASE, AMYLASE in the last 168 hours. No results for input(s): AMMONIA in the last 168 hours. Coagulation Profile: No results for input(s): INR, PROTIME in the last 168 hours. Cardiac Enzymes: No results for input(s): CKTOTAL, CKMB, CKMBINDEX, TROPONINI in the last 168 hours. BNP (last 3 results) No  results for input(s): PROBNP in the last 8760 hours. HbA1C: No results for input(s): HGBA1C in the last 72 hours. CBG: No results for input(s): GLUCAP in the last 168 hours. Lipid Profile: No results for input(s): CHOL, HDL, LDLCALC, TRIG, CHOLHDL, LDLDIRECT in the last 72 hours. Thyroid Function Tests: No results for input(s): TSH, T4TOTAL, FREET4, T3FREE, THYROIDAB in the last 72 hours. Anemia Panel: No results for input(s): VITAMINB12, FOLATE, FERRITIN, TIBC, IRON, RETICCTPCT in the last 72 hours. Sepsis Labs: No results for input(s): PROCALCITON, LATICACIDVEN in the last 168 hours.  Recent Results (from the past 240 hour(s))  SARS CORONAVIRUS 2 (TAT 6-24 HRS) Nasopharyngeal Nasopharyngeal Swab  Status: None   Collection Time: 07/27/19  9:37 PM   Specimen: Nasopharyngeal Swab  Result Value Ref Range Status   SARS Coronavirus 2 NEGATIVE NEGATIVE Final    Comment: (NOTE) SARS-CoV-2 target nucleic acids are NOT DETECTED. The SARS-CoV-2 RNA is generally detectable in upper and lower respiratory specimens during the acute phase of infection. Negative results do not preclude SARS-CoV-2 infection, do not rule out co-infections with other pathogens, and should not be used as the sole basis for treatment or other patient management decisions. Negative results must be combined with clinical observations, patient history, and epidemiological information. The expected result is Negative. Fact Sheet for Patients: SugarRoll.be Fact Sheet for Healthcare Providers: https://www.woods-mathews.com/ This test is not yet approved or cleared by the [place] FDA and  has been authorized for detection and/or diagnosis of SARS-CoV-2 by FDA under an Emergency Use Authorization (EUA). This EUA will remain  in effect (meaning this test can be used) for the duration of the COVID-19 declaration under Section 56 4(b)(1) of the Act, 21 U.S.C. section  360bbb-3(b)(1), unless the authorization is terminated or revoked sooner. Performed at [facility], [place] [address], [place], [place] [address]      Radiology Studies: DG Wrist 2 Views Right  Result Date: 07/29/2019 CLINICAL DATA:  Right wrist pain for 4 days EXAM: RIGHT WRIST - 2 VIEW COMPARISON:  Right hand radiographs dated 05/24/2014 FINDINGS: There is no evidence of fracture or dislocation. There is no evidence of arthropathy or other focal bone abnormality. Soft tissues are unremarkable. IMPRESSION: Negative. Electronically Signed   By: Zerita Boers M.D.   On: 07/29/2019 14:31   DG Shoulder Right Port  Result Date: 07/29/2019 CLINICAL DATA:  Chronic right shoulder pain EXAM: PORTABLE RIGHT SHOULDER COMPARISON:  None. FINDINGS: There is no evidence of fracture or dislocation. The humeral head is high riding within the glenohumeral joint, likely reflecting a rotator cuff tear and/or degenerative change. IMPRESSION: 1. No acute fracture or dislocation. 2. High riding humeral head, likely reflecting a rotator cuff tear and/or degenerative change. Electronically Signed   By: Zerita Boers M.D.   On: 07/29/2019 14:29   Scheduled Meds: . aspirin  325 mg Oral Daily  . atorvastatin  40 mg Oral q1800  . furosemide  40 mg Oral BID  . montelukast  10 mg Oral QHS  . sodium chloride flush  3 mL Intravenous Q12H   Continuous Infusions: . sodium chloride    . cefTRIAXone (ROCEPHIN)  IV 1 g (07/30/19 1030)     LOS: 3 days    Time spent: 25 mins    Jamaiyah Pyle, MD [facility]   If 7PM-7AM, please contact night-coverage

## 2019-07-31 NOTE — Progress Notes (Signed)
PROGRESS NOTE    Pamela [name]  FVC:[id number] DOB: 03/07/1951 DOA: 07/27/2019 PCP: Dorena Dew, FNP   Brief Narrative: Pamela [name] is a 69 y.o. female with medical history significant of chronic kidney disease with a baseline creatinine of 2, diastolic congestive heart failure, chronic edema lower extremities, hypertension, obstructive sleep apnea, obesity was diagnosed with a UTI last week and placed on Bactrim.  She is allergic to sulfa but did not know Bactrim was sulfa drug.  She took the Bactrim for 3 days and then broke out in hives during that time.  Her legs started to swell more.  She denies any fevers.  She denies any shortness of breath.  She denies any nausea vomiting or diarrhea.  She has been urinating.  Patient found to have acute kidney injury with a creatinine bump up to 3.56 from her baseline of 2.  Patient be referred for admission for acute kidney injury and lower extremity edema.  Her creatinine is improving.  Nephrology consulted given history of CHF and acute kidney injury.  Assessment & Plan:   Principal Problem:   Acute renal failure superimposed on stage 3 chronic kidney disease (HCC) Active Problems:   Morbid (severe) obesity due to excess calories (HCC)   Anemia of chronic disease   Asthma, chronic   Chronic diastolic CHF (congestive heart failure) (HCC)   Hypokalemia   # Acute renal failure superimposed on stage 3 chronic kidney disease ([place]) Improving and backing to normal. -ongoing to hold her Bactrim this is all likely due to ATN from Bactrim use.   She has been given Lasix 40 mg IV in the emergency department.  we are going to Ace wrap her legs if she can tolerate that and given her some Norco for the pain.  Would not increase her Lasix dosing any further secondary to acute kidney injury.  Hopefully elevating her legs through the night and wrapping them will help and will give her kidney some time to recover from the Bactrim use before increasing her  diuretics.  Reports adequate urinary output.  Monitor closely.  Also hold off on IV fluids at this time secondary to her edema. Nephrology consulted, Zaroxolyn discontinued. Lasix restarted, renal functions improving.   # Hypokalemia- resolved . replete potassium,  check mag level.  Repeat potassium level in the morning    # Morbid (severe) obesity due to excess calories ([place])- stable    # Anemia of chronic disease-  stable at baseline    # Asthma, chronic-   stable    # Chronic diastolic CHF (congestive heart failure) (HCC)-stable as above  #  UTI : Treated with ceftriaxone ,  Urine culture grew Klebsiella sensitive to augmentin.   DVT prophylaxis: SCDS Code Status: Full Family Communication:  Discussed with patient in detail. Disposition Plan: SNF.  Social worker consulted.  Consultants:    None  Procedures: None Antimicrobials:  Anti-infectives (From admission, onward)   Start     Dose/Rate Route Frequency Ordered Stop   07/28/19 1000  cefTRIAXone (ROCEPHIN) 1 g in sodium chloride 0.9 % 100 mL IVPB  Status:  Discontinued     1 g 200 mL/hr over 30 Minutes Intravenous Every 24 hours 07/28/19 0925 07/31/19 0959      Subjective: No nausea no vomiting.  No chest pain abdominal pain.  Continues to have generalized body ache.  No fever no chills.  Objective: Vitals:   07/30/19 1338 07/30/19 2120 07/31/19 0534 07/31/19 1331  BP: 111/66 (!) 126/57  131/72 117/64  Pulse: 67 72 60 67  Resp: 18 20 18 18   Temp: 98.2 F (36.8 C) 99.6 F (37.6 C) 97.6 F (36.4 C) 98.6 F (37 C)  TempSrc: Oral Oral Oral Oral  SpO2: 97% 95% 97% 97%  Weight:      Height:        Intake/Output Summary (Last 24 hours) at 07/31/2019 1721 Last data filed at 07/31/2019 1510 Gross per 24 hour  Intake 600 ml  Output 2650 ml  Net -2050 ml   Filed Weights   07/27/19 1603 07/28/19 0136  Weight: (!) 158.8 kg (!) 155.7 kg    Examination:  General exam: Appears calm and comfortable    Respiratory system: Clear to auscultation. Respiratory effort normal. Cardiovascular system: S1 & S2 heard, RRR. No JVD, murmurs, rubs, gallops or clicks. No pedal edema. Gastrointestinal system: Abdomen is nondistended, soft and nontender. No organomegaly or masses felt. Normal bowel sounds heard. Central nervous system: Alert and oriented. No focal neurological deficits. Extremities: Significant bilateral leg edema.  Right shoulder tenderness. Skin: No rashes, lesions or ulcers Psychiatry: Judgement and insight appear normal. Mood & affect appropriate.    Data Reviewed: I have personally reviewed following labs and imaging studies  CBC: Recent Labs  Lab 07/27/19 1613 07/28/19 0447 07/29/19 0534 07/30/19 0543  WBC 9.7 8.4 7.1 6.8  HGB 11.2* 10.2* 10.0* 10.0*  HCT 35.7* 31.6* 31.1* 31.8*  MCV 68.9* 68.7* 69.6* 69.7*  PLT 206 187 218 657   Basic Metabolic Panel: Recent Labs  Lab 07/27/19 1613 07/28/19 0447 07/28/19 2038 07/29/19 0534 07/30/19 0543  NA 136 136  --  136 134*  K 3.2* 3.1* 3.8 3.8 3.8  CL 93* 92*  --  95* 93*  CO2 28 30  --  30 30  GLUCOSE 121* 142*  --  112* 108*  BUN 76* 76*  --  65* 57*  CREATININE 3.56* 3.06*  --  2.25* 1.80*  CALCIUM 8.6* 8.2*  --  8.1* 8.2*  MG  --   --   --  1.8 1.9  PHOS  --   --   --  3.4  --    GFR: Estimated Creatinine Clearance: 47.5 mL/min (A) (by C-G formula based on SCr of 1.8 mg/dL (H)). Liver Function Tests: Recent Labs  Lab 07/29/19 0534  AST 27  ALT 34  ALKPHOS 217*  BILITOT 1.1  PROT 6.3*  ALBUMIN 2.4*   No results for input(s): LIPASE, AMYLASE in the last 168 hours. No results for input(s): AMMONIA in the last 168 hours. Coagulation Profile: No results for input(s): INR, PROTIME in the last 168 hours. Cardiac Enzymes: No results for input(s): CKTOTAL, CKMB, CKMBINDEX, TROPONINI in the last 168 hours. BNP (last 3 results) No results for input(s): PROBNP in the last 8760 hours. HbA1C: No results for  input(s): HGBA1C in the last 72 hours. CBG: No results for input(s): GLUCAP in the last 168 hours. Lipid Profile: No results for input(s): CHOL, HDL, LDLCALC, TRIG, CHOLHDL, LDLDIRECT in the last 72 hours. Thyroid Function Tests: No results for input(s): TSH, T4TOTAL, FREET4, T3FREE, THYROIDAB in the last 72 hours. Anemia Panel: No results for input(s): VITAMINB12, FOLATE, FERRITIN, TIBC, IRON, RETICCTPCT in the last 72 hours. Sepsis Labs: No results for input(s): PROCALCITON, LATICACIDVEN in the last 168 hours.  Recent Results (from the past 240 hour(s))  SARS CORONAVIRUS 2 (TAT 6-24 HRS) Nasopharyngeal Nasopharyngeal Swab     Status: None   Collection Time: 07/27/19  9:37 PM   Specimen: Nasopharyngeal Swab  Result Value Ref Range Status   SARS Coronavirus 2 NEGATIVE NEGATIVE Final    Comment: (NOTE) SARS-CoV-2 target nucleic acids are NOT DETECTED. The SARS-CoV-2 RNA is generally detectable in upper and lower respiratory specimens during the acute phase of infection. Negative results do not preclude SARS-CoV-2 infection, do not rule out co-infections with other pathogens, and should not be used as the sole basis for treatment or other patient management decisions. Negative results must be combined with clinical observations, patient history, and epidemiological information. The expected result is Negative. Fact Sheet for Patients: SugarRoll.be Fact Sheet for Healthcare Providers: https://www.woods-mathews.com/ This test is not yet approved or cleared by the [place] FDA and  has been authorized for detection and/or diagnosis of SARS-CoV-2 by FDA under an Emergency Use Authorization (EUA). This EUA will remain  in effect (meaning this test can be used) for the duration of the COVID-19 declaration under Section 56 4(b)(1) of the Act, 21 U.S.C. section 360bbb-3(b)(1), unless the authorization is terminated or revoked sooner. Performed  at [facility], [place] [address] [place],  [address]      Radiology Studies: No results found. Scheduled Meds: . aspirin  325 mg Oral Daily  . atorvastatin  40 mg Oral q1800  . furosemide  40 mg Oral BID  . montelukast  10 mg Oral QHS  . sodium chloride flush  3 mL Intravenous Q12H   Continuous Infusions: . sodium chloride       LOS: 4 days    Time spent: 25 mins    Berle Mull, MD [facility]   If 7PM-7AM, please contact night-coverage

## 2019-07-31 NOTE — Evaluation (Signed)
Physical Therapy Evaluation Patient Details Name: Pamela [name] MRN: [id number] DOB: 1951-05-11 Today's Date: 07/31/2019   History of Present Illness  69 yo female admitted with ARF, LE edema. Hx of sickle cell trait, sarcoidosis, COPD, CHF, CVA, COVID, morbid obesity, lung ca, gout  Clinical Impression  On eval, pt required Mod assist to stand and Min assist to take several side steps along the side of the bed using a RW. Deferred xfer to chair on today due to pt's fear she would not be able to get out of it. Pt presents with general weakness, decreased activity tolerance, and impaired gait and balance. Discussed d/c plan-pt [facility] to regain PLOF.     Follow Up Recommendations SNF    Equipment Recommendations  None recommended by PT    Recommendations for Other Services       Precautions / Restrictions Precautions Precautions: Fall Restrictions Weight Bearing Restrictions: No      Mobility  Bed Mobility Overal bed mobility: Needs Assistance Bed Mobility: Supine to Sit     Supine to sit: Min guard;HOB elevated     General bed mobility comments: Increased time and reliance on bedrail.  Transfers Overall transfer level: Needs assistance Equipment used: Rolling walker (2 wheeled) Transfers: Sit to/from Stand Sit to Stand: Mod assist;From elevated surface         General transfer comment: 4 attempts to get to standing. Pt used momentum to help power up.  Ambulation/Gait Ambulation/Gait assistance: Min assist   Assistive device: Rolling walker (2 wheeled)       General Gait Details: Side steps along side of bed with use of RW. Assist to steady.  Stairs            Wheelchair Mobility    Modified Rankin (Stroke Patients Only)       Balance Overall balance assessment: Needs assistance         Standing balance support: Bilateral upper extremity supported Standing balance-Leahy Scale: Poor                                Pertinent Vitals/Pain Pain Assessment: Faces Faces Pain Scale: Hurts even more Pain Location: R ankle, L foot Pain Descriptors / Indicators: Grimacing;Guarding Pain Intervention(s): Limited activity within patient's tolerance    Home Living Family/patient expects to be discharged to:: Unsure Living Arrangements: Children;Other relatives Available Help at Discharge: Family;Available PRN/intermittently Type of Home: House Home Access: Stairs to enter   Entrance Stairs-Number of Steps: 1 Home Layout: Able to live on main level with bedroom/bathroom;Other (Comment) Home Equipment: Cane - single point;Walker - 2 wheels Additional Comments: son and family live on second level    Prior Function Level of Independence: Independent with assistive device(s)         Comments: ambulates with a cane vs RW (RW mostly as of late), bathes and dresses herself, uses [employer] for transportation     Hand Dominance        Extremity/Trunk Assessment   Upper Extremity Assessment Upper Extremity Assessment: Generalized weakness    Lower Extremity Assessment Lower Extremity Assessment: Generalized weakness    Cervical / Trunk Assessment Cervical / Trunk Assessment: Normal  Communication   Communication: No difficulties  Cognition Arousal/Alertness: Awake/alert Behavior During Therapy: WFL for tasks assessed/performed Overall Cognitive Status: Within Functional Limits for tasks assessed  General Comments      Exercises Total Joint Exercises Long Arc Quad: AROM;15 reps;Both;Seated   Assessment/Plan    PT Assessment Patient needs continued PT services  PT Problem List Decreased strength;Decreased mobility;Decreased balance;Decreased activity tolerance;Obesity       PT Treatment Interventions DME instruction;Gait training;Therapeutic exercise;Therapeutic activities;Patient/family education;Functional mobility  training;Balance training    PT Goals (Current goals can be found in the Care Plan section)  Acute Rehab PT Goals Patient Stated Goal: to get back to PLOF PT Goal Formulation: With patient Time For Goal Achievement: 08/14/19 Potential to Achieve Goals: Fair    Frequency Min 2X/week   Barriers to discharge        Co-evaluation               AM-PAC PT "6 Clicks" Mobility  Outcome Measure Help needed turning from your back to your side while in a flat bed without using bedrails?: A Little Help needed moving from lying on your back to sitting on the side of a flat bed without using bedrails?: A Little Help needed moving to and from a bed to a chair (including a wheelchair)?: A Lot Help needed standing up from a chair using your arms (e.g., wheelchair or bedside chair)?: A Lot Help needed to walk in hospital room?: A Lot Help needed climbing 3-5 steps with a railing? : Total 6 Click Score: 13    End of Session   Activity Tolerance: Patient tolerated treatment well Patient left: in bed;with call bell/phone within reach(sitting at EOB) Nurse Communication: (made NT aware that pt was sitting EOB) PT Visit Diagnosis: Muscle weakness (generalized) (M62.81);Difficulty in walking, not elsewhere classified (R26.2)    Time: 9024-0973 PT Time Calculation (min) (ACUTE ONLY): 23 min   Charges:   PT Evaluation $PT Eval Moderate Complexity: 1 Mod PT Treatments $Therapeutic Activity: 8-22 mins           Doreatha Massed, PT Acute Rehabilitation

## 2019-07-31 NOTE — Care Management Important Message (Signed)
Important Message  Patient Details IM Letter given to Marney Doctor RN Case Manager to present to the Patient Name: Britlee Skolnik MRN: [id number] Date of Birth: 03-09-1951   Medicare Important Message Given:  Yes     Kerin Salen 07/31/2019, 10:59 AM

## 2019-08-01 ENCOUNTER — Telehealth: Payer: Self-pay | Admitting: Family Medicine

## 2019-08-01 LAB — COMPREHENSIVE METABOLIC PANEL
ALT: 21 U/L (ref 0–44)
AST: 20 U/L (ref 15–41)
Albumin: 2.4 g/dL — ABNORMAL LOW (ref 3.5–5.0)
Alkaline Phosphatase: 206 U/L — ABNORMAL HIGH (ref 38–126)
Anion gap: 13 (ref 5–15)
BUN: 51 mg/dL — ABNORMAL HIGH (ref 8–23)
CO2: 33 mmol/L — ABNORMAL HIGH (ref 22–32)
Calcium: 8.8 mg/dL — ABNORMAL LOW (ref 8.9–10.3)
Chloride: 91 mmol/L — ABNORMAL LOW (ref 98–111)
Creatinine, Ser: 1.82 mg/dL — ABNORMAL HIGH (ref 0.44–1.00)
GFR calc Af Amer: 33 mL/min — ABNORMAL LOW (ref >60)
GFR calc non Af Amer: 28 mL/min — ABNORMAL LOW (ref >60)
Glucose, Bld: 112 mg/dL — ABNORMAL HIGH (ref 70–99)
Potassium: 3.4 mmol/L — ABNORMAL LOW (ref 3.5–5.1)
Sodium: 137 mmol/L (ref 135–145)
Total Bilirubin: 0.6 mg/dL (ref 0.3–1.2)
Total Protein: 6.8 g/dL (ref 6.5–8.1)

## 2019-08-01 LAB — CBC WITH DIFFERENTIAL/PLATELET
Abs Immature Granulocytes: 0.23 10*3/uL — ABNORMAL HIGH (ref 0.00–0.07)
Basophils Absolute: 0 10*3/uL (ref 0.0–0.1)
Basophils Relative: 1 %
Eosinophils Absolute: 0.7 10*3/uL — ABNORMAL HIGH (ref 0.0–0.5)
Eosinophils Relative: 12 %
HCT: 33.2 % — ABNORMAL LOW (ref 36.0–46.0)
Hemoglobin: 10.4 g/dL — ABNORMAL LOW (ref 12.0–15.0)
Immature Granulocytes: 4 %
Lymphocytes Relative: 19 %
Lymphs Abs: 1.1 10*3/uL (ref 0.7–4.0)
MCH: 22.1 pg — ABNORMAL LOW (ref 26.0–34.0)
MCHC: 31.3 g/dL (ref 30.0–36.0)
MCV: 70.6 fL — ABNORMAL LOW (ref 80.0–100.0)
Monocytes Absolute: 0.4 10*3/uL (ref 0.1–1.0)
Monocytes Relative: 7 %
Neutro Abs: 3.2 10*3/uL (ref 1.7–7.7)
Neutrophils Relative %: 57 %
Platelets: 281 10*3/uL (ref 150–400)
RBC: 4.7 MIL/uL (ref 3.87–5.11)
RDW: 15 % (ref 11.5–15.5)
WBC: 5.7 10*3/uL (ref 4.0–10.5)
nRBC: 0 % (ref 0.0–0.2)

## 2019-08-01 LAB — MAGNESIUM: Magnesium: 1.7 mg/dL (ref 1.7–2.4)

## 2019-08-01 LAB — SARS CORONAVIRUS 2 (TAT 6-24 HRS): SARS Coronavirus 2: NEGATIVE

## 2019-08-01 MED ORDER — POTASSIUM CHLORIDE CRYS ER 20 MEQ PO TBCR
40.0000 meq | EXTENDED_RELEASE_TABLET | Freq: Once | ORAL | Status: AC
  Administered 2019-08-01: 08:00:00 40 meq via ORAL
  Filled 2019-08-01: qty 2

## 2019-08-01 NOTE — Progress Notes (Signed)
PROGRESS NOTE    Pamela [name]  JOA:[id number] DOB: 1950-10-02 DOA: 07/27/2019 PCP: Dorena Dew, FNP   Brief Narrative:  Patient is a 69 year old female with history of chronic kidney disease with baseline creatinine of 2, diastolic CHF, chronic bilateral lower extremity lymphedema, hypertension, OSA, obesity who was recently diagnosed with UTI and was placed on Bactrim presented to the emergency department with complaints of hives, swelling of legs.  Patient was found to have acute kidney injury on CKD.  Nephrology was also consulted .  Kidney function improving.  PT/OT recommend discussing facility.  Plan for discharge tomorrow.    Assessment & Plan:   Principal Problem:   Acute renal failure superimposed on stage 3 chronic kidney disease (HCC) Active Problems:   Morbid (severe) obesity due to excess calories (HCC)   Anemia of chronic disease   Asthma, chronic   Chronic diastolic CHF (congestive heart failure) (HCC)   Hypokalemia   AKI on CKD stage IIIa: Likely secondary to Bactrim that she was taking for UTI.  Kidney function improving.  Currently at baseline.  Nephrology was also following currently.  Lasix restarted.  Hypokalemia: Supplemented with potassium  Anemia of chronic disease: Currently H&H stable.  History of asthma: Chronic.  Stable.  Chronic diastolic CHF: Currently appears euvolemic.  Restarted home Lasix.  She has chronic bilateral lower extremity edema.  UTI: Completed antibiotics course.  Denies any dysuria  Bilateral lower extremity lymphedema: Continue supportive care, compression stockings  Debility/deconditioning: Seen by PT and recommended skilled nursing facility.  Plan for discharge tomorrow.         DVT prophylaxis:SCD Code Status: Full code Family Communication: None present at the bedside Disposition Plan: Skilled nursing facility tomorrow   Consultants: Nephrology  Procedures: None  Antimicrobials:  Anti-infectives (From  admission, onward)   Start     Dose/Rate Route Frequency Ordered Stop   07/28/19 1000  cefTRIAXone (ROCEPHIN) 1 g in sodium chloride 0.9 % 100 mL IVPB  Status:  Discontinued     1 g 200 mL/hr over 30 Minutes Intravenous Every 24 hours 07/28/19 0925 07/31/19 0959      Subjective:  Patient seen and examined at the bedside this afternoon.  Hemodynamically stable.  Comfortable.  No new complaints.  Objective: Vitals:   07/31/19 1331 07/31/19 2110 08/01/19 0703 08/01/19 1423  BP: 117/64 114/67 128/72 (!) 115/59  Pulse: 67 66 62 67  Resp: 18 16 14 18   Temp: 98.6 F (37 C) 98.9 F (37.2 C) 98.7 F (37.1 C) 98.4 F (36.9 C)  TempSrc: Oral Oral Oral Oral  SpO2: 97% 98% 95% 95%  Weight:      Height:        Intake/Output Summary (Last 24 hours) at 08/01/2019 1511 Last data filed at 07/31/2019 1802 Gross per 24 hour  Intake 240 ml  Output --  Net 240 ml   Filed Weights   07/27/19 1603 07/28/19 0136  Weight: (!) 158.8 kg (!) 155.7 kg    Examination:  General exam: Appears calm and comfortable ,Not in distress, morbidly obese HEENT:PERRL,Oral mucosa moist, Ear/Nose normal on gross exam Respiratory system: Bilateral equal air entry, normal vesicular breath sounds, no wheezes or crackles  Cardiovascular system: S1 & S2 heard, RRR. No JVD, murmurs, rubs, gallops or clicks. No pedal edema. Gastrointestinal system: Abdomen is nondistended, soft and nontender. No organomegaly or masses felt. Normal bowel sounds heard. Central nervous system: Alert and oriented. No focal neurological deficits. Extremities: Trace bilateral lower extremity edema,no clubbing ,  no cyanosis Skin: No rashes, lesions or ulcers,no icterus ,no pallor , chronic venous insufficiency with venous stasis changes  Data Reviewed: I have personally reviewed following labs and imaging studies  CBC: Recent Labs  Lab 07/27/19 1613 07/28/19 0447 07/29/19 0534 07/30/19 0543 08/01/19 0515  WBC 9.7 8.4 7.1 6.8 5.7   NEUTROABS  --   --   --   --  3.2  HGB 11.2* 10.2* 10.0* 10.0* 10.4*  HCT 35.7* 31.6* 31.1* 31.8* 33.2*  MCV 68.9* 68.7* 69.6* 69.7* 70.6*  PLT 206 187 218 254 517   Basic Metabolic Panel: Recent Labs  Lab 07/27/19 1613 07/28/19 0447 07/28/19 2038 07/29/19 0534 07/30/19 0543 08/01/19 0515  NA 136 136  --  136 134* 137  K 3.2* 3.1* 3.8 3.8 3.8 3.4*  CL 93* 92*  --  95* 93* 91*  CO2 28 30  --  30 30 33*  GLUCOSE 121* 142*  --  112* 108* 112*  BUN 76* 76*  --  65* 57* 51*  CREATININE 3.56* 3.06*  --  2.25* 1.80* 1.82*  CALCIUM 8.6* 8.2*  --  8.1* 8.2* 8.8*  MG  --   --   --  1.8 1.9 1.7  PHOS  --   --   --  3.4  --   --    GFR: Estimated Creatinine Clearance: 47 mL/min (A) (by C-G formula based on SCr of 1.82 mg/dL (H)). Liver Function Tests: Recent Labs  Lab 07/29/19 0534 08/01/19 0515  AST 27 20  ALT 34 21  ALKPHOS 217* 206*  BILITOT 1.1 0.6  PROT 6.3* 6.8  ALBUMIN 2.4* 2.4*   No results for input(s): LIPASE, AMYLASE in the last 168 hours. No results for input(s): AMMONIA in the last 168 hours. Coagulation Profile: No results for input(s): INR, PROTIME in the last 168 hours. Cardiac Enzymes: No results for input(s): CKTOTAL, CKMB, CKMBINDEX, TROPONINI in the last 168 hours. BNP (last 3 results) No results for input(s): PROBNP in the last 8760 hours. HbA1C: No results for input(s): HGBA1C in the last 72 hours. CBG: No results for input(s): GLUCAP in the last 168 hours. Lipid Profile: No results for input(s): CHOL, HDL, LDLCALC, TRIG, CHOLHDL, LDLDIRECT in the last 72 hours. Thyroid Function Tests: No results for input(s): TSH, T4TOTAL, FREET4, T3FREE, THYROIDAB in the last 72 hours. Anemia Panel: No results for input(s): VITAMINB12, FOLATE, FERRITIN, TIBC, IRON, RETICCTPCT in the last 72 hours. Sepsis Labs: No results for input(s): PROCALCITON, LATICACIDVEN in the last 168 hours.  Recent Results (from the past 240 hour(s))  SARS CORONAVIRUS 2 (TAT 6-24  HRS) Nasopharyngeal Nasopharyngeal Swab     Status: None   Collection Time: 07/27/19  9:37 PM   Specimen: Nasopharyngeal Swab  Result Value Ref Range Status   SARS Coronavirus 2 NEGATIVE NEGATIVE Final    Comment: (NOTE) SARS-CoV-2 target nucleic acids are NOT DETECTED. The SARS-CoV-2 RNA is generally detectable in upper and lower respiratory specimens during the acute phase of infection. Negative results do not preclude SARS-CoV-2 infection, do not rule out co-infections with other pathogens, and should not be used as the sole basis for treatment or other patient management decisions. Negative results must be combined with clinical observations, patient history, and epidemiological information. The expected result is Negative. Fact Sheet for Patients: SugarRoll.be Fact Sheet for Healthcare Providers: https://www.woods-mathews.com/ This test is not yet approved or cleared by the [place] FDA and  has been authorized for detection and/or diagnosis of SARS-CoV-2  by FDA under an Emergency Use Authorization (EUA). This EUA will remain  in effect (meaning this test can be used) for the duration of the COVID-19 declaration under Section 56 4(b)(1) of the Act, 21 U.S.C. section 360bbb-3(b)(1), unless the authorization is terminated or revoked sooner. Performed at [facility], [place] [address] [place], [place] [address]          Radiology Studies: No results found.      Scheduled Meds: . aspirin  325 mg Oral Daily  . atorvastatin  40 mg Oral q1800  . furosemide  40 mg Oral BID  . montelukast  10 mg Oral QHS  . sodium chloride flush  3 mL Intravenous Q12H   Continuous Infusions: . sodium chloride       LOS: 5 days    Time spent: 35 mins.More than 50% of that time was spent in counseling and/or coordination of care.      Shelly Coss, MD [facility] Pager [phone]  If 7PM-7AM, please contact  night-coverage [email or web address] Password [facility] 08/01/2019, 3:11 PM

## 2019-08-01 NOTE — TOC Initial Note (Signed)
Transition of Care [facility]) - Initial/Assessment Note    Patient Details  Name: Pamela [name] MRN: [id number] Date of Birth: [birth year]  Transition of Care [facility]) CM/SW Contact:    Lynnell Catalan, RN Phone Number: 08/01/2019, 10:13 AM  Clinical Narrative:                 PT recommendations for SNF gone over with pt. Patient would like to go to short term SNF. Pt FL2 completed and faxed out to area facilities.  Expected Discharge Plan: Skilled Nursing Facility Barriers to Discharge: No Barriers Identified   Expected Discharge Plan and Services Expected Discharge Plan: [place]   Discharge Planning Services: CM Consult    Prior Living Arrangements/Services   Lives with:: Relatives, Adult Children          Need for Family Participation in Patient Care: Yes (Comment) Care giver support system in place?: Yes (comment)      Activities of Daily Living Home Assistive Devices/Equipment: Cane (specify quad or straight), Walker (specify type) ADL Screening (condition at time of admission) Patient's cognitive ability adequate to safely complete daily activities?: Yes Is the patient deaf or have difficulty hearing?: No Does the patient have difficulty seeing, even when wearing glasses/contacts?: No Does the patient have difficulty concentrating, remembering, or making decisions?: No Patient able to express need for assistance with ADLs?: Yes Does the patient have difficulty dressing or bathing?: No Independently performs ADLs?: Yes (appropriate for developmental age) Does the patient have difficulty walking or climbing stairs?: Yes Weakness of Legs: Both Weakness of Arms/Hands: None  Permission Sought/Granted                  Emotional Assessment Appearance:: Appears stated age            Admission diagnosis:  Acute on chronic diastolic congestive heart failure ([place]) [I50.33] AKI (acute kidney injury) ([place]) [N17.9] Patient Active Problem List   Diagnosis Date Noted  . AKI (acute kidney injury) ([place]) 07/27/2019  . COVID-19 virus infection 01/02/2019  . Right knee pain 09/15/2018  . Closed head injury with concussion 09/06/2018  . Acute CVA (cerebrovascular accident) ([place]) 09/06/2018  . Hypokalemia 09/06/2018  . Hypomagnesemia 09/06/2018  . Asthma, chronic obstructive, with acute exacerbation ([place]) 08/02/2018  . Plantar fasciitis 04/14/2017  . Microcytosis 11/28/2015  . 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 Onychomycosis 01/09/2015  . Ingrown nail 01/09/2015  . Pain in lower limb 01/09/2015  . Chronic respiratory failure with hypoxia and hypercapnia ([place]) 07/19/2014  . CHF (congestive heart failure) ([place]) 06/11/2014  . Anemia, iron deficiency 05/27/2014  . Acute gouty arthritis 05/25/2014  . Acute renal failure superimposed on stage 3 chronic kidney disease ([place]) 05/25/2014  . Obstructive sleep apnea 05/25/2014  . Joint pain 05/24/2014  . Chronic diastolic CHF (congestive heart failure) ([place])   . Sarcoidosis   . Metabolic syndrome 42/70/6237  . Asthma, chronic 04/27/2014  . Anemia of chronic disease 04/19/2014  . Morbid (severe) obesity due to excess calories ([place]) 04/18/2014  . Immunization due 04/18/2014  . Need for prophylactic vaccination and inoculation against influenza 04/18/2014  . Prediabetes 04/13/2014  . Essential hypertension 04/13/2014  . Lower extremity edema 04/13/2014  . History of lung cancer 04/13/2014   PCP:  Dorena Dew, FNP Pharmacy:   [facility] [place] [address] [place] ([place]) [place] [address] Phone: [phone] Fax: [phone]     Social Determinants of Health (SDOH) Interventions  Readmission Risk Interventions Readmission Risk Prevention Plan 01/05/2019  Transportation Screening Complete  Medication Review Press photographer) Complete  PCP or Specialist appointment within 3-5 days of discharge Complete  HRI or Home Care Consult  Patient refused  SW Recovery Care/Counseling Consult Complete  Palliative Care Screening Not [place] Not Applicable  Some recent data might be hidden

## 2019-08-01 NOTE — Progress Notes (Signed)
ILR remote 

## 2019-08-01 NOTE — Telephone Encounter (Signed)
Pamela [name] is currently in the Hospital. She has requested a call back from you [phone]. Per patient she was allergic to the antibiotic prescribed.  Patient aware that you will be informed and to follow up after discharge.    Please advise.

## 2019-08-01 NOTE — NC FL2 (Signed)
[place] LEVEL OF CARE SCREENING TOOL     IDENTIFICATION  Patient Name: Pamela [name] Birthdate: 1950/09/29 Sex: female Admission Date (Current Location): 07/27/2019  [place] and [place] Number:  Herbalist and Address:  [facility],  [place] [address], [place]      Provider Number: [id number]  Attending Physician Name and Address:  Shelly Coss, MD  Relative Name and Phone Number:       Current Level of Care: Hospital Recommended Level of Care: [place] Prior Approval Number:    Date Approved/Denied:   PASRR Number: [id number]  Discharge Plan: SNF    Current Diagnoses: Patient Active Problem List   Diagnosis Date Noted  . AKI (acute kidney injury) ([place]) 07/27/2019  . COVID-19 virus infection 01/02/2019  . Right knee pain 09/15/2018  . Closed head injury with concussion 09/06/2018  . Acute CVA (cerebrovascular accident) ([place]) 09/06/2018  . Hypokalemia 09/06/2018  . Hypomagnesemia 09/06/2018  . Asthma, chronic obstructive, with acute exacerbation ([place]) 08/02/2018  . Plantar fasciitis 04/14/2017  . Microcytosis 11/28/2015  . 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 Onychomycosis 01/09/2015  . Ingrown nail 01/09/2015  . Pain in lower limb 01/09/2015  . Chronic respiratory failure with hypoxia and hypercapnia ([place]) 07/19/2014  . CHF (congestive heart failure) ([place]) 06/11/2014  . Anemia, iron deficiency 05/27/2014  . Acute gouty arthritis 05/25/2014  . Acute renal failure superimposed on stage 3 chronic kidney disease ([place]) 05/25/2014  . Obstructive sleep apnea 05/25/2014  . Joint pain 05/24/2014  . Chronic diastolic CHF (congestive heart failure) ([place])   . Sarcoidosis   . Metabolic syndrome 32/44/0102  . Asthma, chronic 04/27/2014  . Anemia of chronic disease 04/19/2014  . Morbid (severe) obesity due to excess calories ([place]) 04/18/2014  . Immunization due 04/18/2014  . Need for prophylactic vaccination and  inoculation against influenza 04/18/2014  . Prediabetes 04/13/2014  . Essential hypertension 04/13/2014  . Lower extremity edema 04/13/2014  . History of lung cancer 04/13/2014    Orientation RESPIRATION BLADDER Height & Weight     Self, Time, Situation, Place  Normal Incontinent Weight: (!) 155.7 kg Height:  5\' 6"  (167.6 cm)  BEHAVIORAL SYMPTOMS/MOOD NEUROLOGICAL BOWEL NUTRITION STATUS      Continent Diet  AMBULATORY STATUS COMMUNICATION OF NEEDS Skin   Extensive Assist Verbally Normal                       Personal Care Assistance Level of Assistance  Bathing, Dressing Bathing Assistance: Limited assistance   Dressing Assistance: Limited assistance     Functional Limitations Info             SPECIAL CARE FACTORS FREQUENCY  PT (By licensed PT), OT (By licensed OT)     PT Frequency: 5 x weekly OT Frequency: 5 x weekly            Contractures Contractures Info: Not present    Additional Factors Info  Code Status, Allergies Code Status Info: Full Allergies Info: Sulfur           Current Medications (08/01/2019):  This is the current hospital active medication list Current Facility-Administered Medications  Medication Dose Route Frequency Provider Last Rate Last Admin  . 0.9 %  sodium chloride infusion  250 mL Intravenous PRN Derrill Kay A, MD      . albuterol (PROVENTIL) (2.5 MG/3ML) 0.083% nebulizer solution 2.5 mg  2.5 mg Nebulization Q6H PRN Phillips Grout, MD      .  aspirin tablet 325 mg  325 mg Oral Daily Phillips Grout, MD   325 mg at 07/31/19 0946  . atorvastatin (LIPITOR) tablet 40 mg  40 mg Oral q1800 Derrill Kay A, MD   40 mg at 07/31/19 1728  . furosemide (LASIX) tablet 40 mg  40 mg Oral BID Dwana Melena, MD   40 mg at 08/01/19 0757  . HYDROcodone-acetaminophen (NORCO/VICODIN) 5-325 MG per tablet 1-2 tablet  1-2 tablet Oral Q4H PRN Phillips Grout, MD   1 tablet at 07/31/19 2158  . montelukast (SINGULAIR) tablet 10 mg  10 mg Oral QHS  Derrill Kay A, MD   10 mg at 07/31/19 2112  . sodium chloride flush (NS) 0.9 % injection 3 mL  3 mL Intravenous Q12H Derrill Kay A, MD   3 mL at 07/31/19 2113  . sodium chloride flush (NS) 0.9 % injection 3 mL  3 mL Intravenous PRN Phillips Grout, MD         Discharge Medications: Please see discharge summary for a list of discharge medications.  Relevant Imaging Results:  Relevant Lab Results:   Additional Information SSN: [id number]  Vitaliy Eisenhour, Marjie Skiff, RN

## 2019-08-02 DIAGNOSIS — I110 Hypertensive heart disease with heart failure: Secondary | ICD-10-CM | POA: Diagnosis not present

## 2019-08-02 DIAGNOSIS — N179 Acute kidney failure, unspecified: Secondary | ICD-10-CM | POA: Diagnosis not present

## 2019-08-02 DIAGNOSIS — M6281 Muscle weakness (generalized): Secondary | ICD-10-CM | POA: Diagnosis not present

## 2019-08-02 DIAGNOSIS — Z20828 Contact with and (suspected) exposure to other viral communicable diseases: Secondary | ICD-10-CM | POA: Diagnosis not present

## 2019-08-02 DIAGNOSIS — N1831 Chronic kidney disease, stage 3a: Secondary | ICD-10-CM | POA: Diagnosis not present

## 2019-08-02 DIAGNOSIS — U071 COVID-19: Secondary | ICD-10-CM | POA: Diagnosis not present

## 2019-08-02 DIAGNOSIS — I5032 Chronic diastolic (congestive) heart failure: Secondary | ICD-10-CM | POA: Diagnosis not present

## 2019-08-02 DIAGNOSIS — J45909 Unspecified asthma, uncomplicated: Secondary | ICD-10-CM | POA: Diagnosis not present

## 2019-08-02 DIAGNOSIS — R2689 Other abnormalities of gait and mobility: Secondary | ICD-10-CM | POA: Diagnosis not present

## 2019-08-02 DIAGNOSIS — N390 Urinary tract infection, site not specified: Secondary | ICD-10-CM | POA: Diagnosis not present

## 2019-08-02 DIAGNOSIS — E785 Hyperlipidemia, unspecified: Secondary | ICD-10-CM | POA: Diagnosis not present

## 2019-08-02 DIAGNOSIS — I10 Essential (primary) hypertension: Secondary | ICD-10-CM | POA: Diagnosis not present

## 2019-08-02 DIAGNOSIS — Z6841 Body Mass Index (BMI) 40.0 and over, adult: Secondary | ICD-10-CM | POA: Diagnosis not present

## 2019-08-02 DIAGNOSIS — I129 Hypertensive chronic kidney disease with stage 1 through stage 4 chronic kidney disease, or unspecified chronic kidney disease: Secondary | ICD-10-CM | POA: Diagnosis not present

## 2019-08-02 DIAGNOSIS — M2550 Pain in unspecified joint: Secondary | ICD-10-CM | POA: Diagnosis not present

## 2019-08-02 DIAGNOSIS — R531 Weakness: Secondary | ICD-10-CM | POA: Diagnosis not present

## 2019-08-02 DIAGNOSIS — I639 Cerebral infarction, unspecified: Secondary | ICD-10-CM | POA: Diagnosis not present

## 2019-08-02 DIAGNOSIS — Z7401 Bed confinement status: Secondary | ICD-10-CM | POA: Diagnosis not present

## 2019-08-02 DIAGNOSIS — N183 Chronic kidney disease, stage 3 unspecified: Secondary | ICD-10-CM | POA: Diagnosis not present

## 2019-08-02 LAB — BASIC METABOLIC PANEL
Anion gap: 12 (ref 5–15)
BUN: 52 mg/dL — ABNORMAL HIGH (ref 8–23)
CO2: 31 mmol/L (ref 22–32)
Calcium: 8.8 mg/dL — ABNORMAL LOW (ref 8.9–10.3)
Chloride: 93 mmol/L — ABNORMAL LOW (ref 98–111)
Creatinine, Ser: 1.55 mg/dL — ABNORMAL HIGH (ref 0.44–1.00)
GFR calc Af Amer: 40 mL/min — ABNORMAL LOW (ref >60)
GFR calc non Af Amer: 35 mL/min — ABNORMAL LOW (ref >60)
Glucose, Bld: 119 mg/dL — ABNORMAL HIGH (ref 70–99)
Potassium: 3.5 mmol/L (ref 3.5–5.1)
Sodium: 136 mmol/L (ref 135–145)

## 2019-08-02 NOTE — Progress Notes (Signed)
Physical Therapy Treatment Patient Details Name: Pamela [name] MRN: [id number] DOB: 07-25-1950 Today's Date: 08/02/2019    History of Present Illness 69 yo female admitted with ARF, LE edema. Hx of sickle cell trait, sarcoidosis, COPD, CHF, CVA, COVID, morbid obesity    PT Comments    Pt with improvement in mobility and gait distance. Needs assist of 2 for safety, rest breaks, and cues for transfer techniques.  Cont POC.    Follow Up Recommendations  SNF     Equipment Recommendations       Recommendations for Other Services       Precautions / Restrictions Precautions Precautions: Fall    Mobility  Bed Mobility Overal bed mobility: Needs Assistance Bed Mobility: Supine to Sit     Supine to sit: Min guard;HOB elevated     General bed mobility comments: Increased time and reliance on bedrail.  Transfers Overall transfer level: Needs assistance Equipment used: Rolling walker (2 wheeled) Transfers: Sit to/from Stand Sit to Stand: From elevated surface;+2 physical assistance;Min assist         General transfer comment: Performed x 2; significant use of momentum to power up  Ambulation/Gait Ambulation/Gait assistance: Min assist Gait Distance (Feet): 4 Feet Assistive device: Rolling walker (2 wheeled) Gait Pattern/deviations: Wide base of support;Trunk flexed;Decreased stride length Gait velocity: decreased   General Gait Details: 4'x2; seated rest break; cues for RW and slow deep breaths   Stairs             Wheelchair Mobility    Modified Rankin (Stroke Patients Only)       Balance Overall balance assessment: Needs assistance Sitting-balance support: Bilateral upper extremity supported;Feet supported Sitting balance-Leahy Scale: Good     Standing balance support: Bilateral upper extremity supported;During functional activity Standing balance-Leahy Scale: Fair                              Cognition Arousal/Alertness:  Awake/alert Behavior During Therapy: WFL for tasks assessed/performed Overall Cognitive Status: Within Functional Limits for tasks assessed                                        Exercises General Exercises - Lower Extremity Ankle Circles/Pumps: AROM;20 reps;Supine Short Arc Quad: AROM;Both;15 reps;Supine Long Arc Quad: AROM;Both;10 reps;Seated Heel Slides: AROM;Both;10 reps;Supine    General Comments        Pertinent Vitals/Pain Pain Assessment: 0-10 Pain Score: 4  Pain Location: R ankle, L foot Pain Descriptors / Indicators: Grimacing;Guarding Pain Intervention(s): Limited activity within patient's tolerance    Home Living                      Prior Function            PT Goals (current goals can now be found in the care plan section) Progress towards PT goals: Progressing toward goals    Frequency    Min 2X/week      PT Plan Current plan remains appropriate    Co-evaluation              AM-PAC PT "6 Clicks" Mobility   Outcome Measure  Help needed turning from your back to your side while in a flat bed without using bedrails?: A Little Help needed moving from lying on your back to sitting on the side of a flat  bed without using bedrails?: A Little Help needed moving to and from a bed to a chair (including a wheelchair)?: A Little Help needed standing up from a chair using your arms (e.g., wheelchair or bedside chair)?: A Lot Help needed to walk in hospital room?: A Lot Help needed climbing 3-5 steps with a railing? : Total 6 Click Score: 14    End of Session Equipment Utilized During Treatment: Gait belt Activity Tolerance: Patient tolerated treatment well Patient left: in bed;with call bell/phone within reach;with bed alarm set Nurse Communication: Mobility status PT Visit Diagnosis: Muscle weakness (generalized) (M62.81);Difficulty in walking, not elsewhere classified (R26.2)     Time: 1130-1200 PT Time Calculation  (min) (ACUTE ONLY): 30 min  Charges:  $Gait Training: 8-22 mins $Therapeutic Exercise: 8-22 mins                     Maggie Font, PT Acute Rehab Services Pager [phone] [facility] [phone]    Karlton Lemon 08/02/2019, 2:20 PM

## 2019-08-02 NOTE — Discharge Summary (Signed)
Physician Discharge Summary  Pamela [name] PYP:[id number] DOB: 02/07/1951 DOA: 07/27/2019  PCP: Dorena Dew, FNP  Admit date: 07/27/2019 Discharge date: 08/02/2019  Admitted From: Home Disposition:  Home  Discharge Condition:Stable CODE STATUS:FULL Diet recommendation: Heart Healthy   Brief/Interim Summary:  Patient is a 69 year old female with history of chronic kidney disease with baseline creatinine of 2, diastolic CHF, chronic bilateral lower extremity lymphedema, hypertension, OSA, obesity who was recently diagnosed with UTI and was placed on Bactrim presented to the emergency department with complaints of hives, swelling of legs.  Patient was found to have acute kidney injury on CKD.  Nephrology was also consulted .  Kidney function gradullay improved and currently is at baseline.  PT/OT recommended SN facility on discharge.    She is hemodynamically stable for discharge today.  Following problems were addressed during hospitalization:  AKI on CKD stage IIIa: Likely secondary to Bactrim that she was taking for UTI.  Kidney function improved.  Currently at baseline.  Nephrology was also following .  Lasix restarted. Check BMP in a week.  Follow-up with her nephrologist in 2 weeks  Hypokalemia: Supplemented with potassium  Anemia of chronic disease: Currently H&H stable.  History of asthma: Chronic.  Stable.  Chronic diastolic CHF: Currently appears euvolemic.  Restarted home Lasix.  She has chronic bilateral lower extremity edema.  UTI: Completed antibiotics course.  Denies any dysuria  Bilateral lower extremity lymphedema: Continue supportive care, compression stockings  Debility/deconditioning: Seen by PT and recommended skilled nursing facility.  .   Discharge Diagnoses:  Principal Problem:   Acute renal failure superimposed on stage 3 chronic kidney disease ([place]) Active Problems:   Morbid (severe) obesity due to excess calories (HCC)   Anemia of chronic  disease   Asthma, chronic   Chronic diastolic CHF (congestive heart failure) (HCC)   Hypokalemia    Discharge Instructions  Discharge Instructions    Diet - low sodium heart healthy   Complete by: As directed    Discharge instructions   Complete by: As directed    1)Do a BMP test in a week. 2)Follow up with your nephrologist in 2 weeks.   Increase activity slowly   Complete by: As directed      Allergies as of 08/02/2019      Reactions   Sulfur Rash      Medication List    STOP taking these medications   metolazone 2.5 MG tablet Commonly known as: ZAROXOLYN   potassium chloride SA 20 MEQ tablet Commonly known as: KLOR-CON   sulfamethoxazole-trimethoprim 800-160 MG tablet Commonly known as: BACTRIM DS     TAKE these medications   acetaminophen 500 MG tablet Commonly known as: TYLENOL Take 1,000 mg by mouth every 6 (six) hours as needed for mild pain.   albuterol (2.5 MG/3ML) 0.083% nebulizer solution Commonly known as: PROVENTIL Take 3 mLs (2.5 mg total) by nebulization every 6 (six) hours as needed for wheezing or shortness of breath.   albuterol 108 (90 Base) MCG/ACT inhaler Commonly known as: ProAir HFA Inhale 2 puffs into the lungs every 6 (six) hours as needed for wheezing or shortness of breath.   aspirin 325 MG tablet Take 1 tablet (325 mg total) by mouth daily.   atorvastatin 40 MG tablet Commonly known as: LIPITOR Take 1 tablet (40 mg total) by mouth daily at 6 PM.   budesonide-formoterol 160-4.5 MCG/ACT inhaler Commonly known as: Symbicort Inhale 2 puffs into the lungs 2 (two) times daily.   calcitRIOL 0.25  MCG capsule Commonly known as: ROCALTROL Take 1 capsule (0.25 mcg total) by mouth daily.   calcium carbonate 500 MG chewable tablet Commonly known as: TUMS - dosed in mg elemental calcium Chew 1-2 tablets by mouth daily as needed for indigestion (gas).   docusate sodium 100 MG capsule Commonly known as: COLACE Take 1 capsule (100 mg  total) by mouth 2 (two) times daily.   febuxostat 40 MG tablet Commonly known as: Uloric Take 1 tablet (40 mg total) by mouth daily.   furosemide 40 MG tablet Commonly known as: LASIX Take 1 tablet (40 mg total) by mouth 2 (two) times daily. For CHF   montelukast 10 MG tablet Commonly known as: SINGULAIR Take 1 tablet (10 mg total) by mouth at bedtime.      Follow-up Information    Dorena Dew, FNP. Schedule an appointment as soon as possible for a visit in 1 week(s).   Specialty: Family Medicine Contact information: [address]. [place] [address] [phone]          Allergies  Allergen Reactions  . Sulfur Rash    Consultations:  Nephrology   Procedures/Studies: DG Wrist 2 Views Right  Result Date: 07/29/2019 CLINICAL DATA:  Right wrist pain for 4 days EXAM: RIGHT WRIST - 2 VIEW COMPARISON:  Right hand radiographs dated 05/24/2014 FINDINGS: There is no evidence of fracture or dislocation. There is no evidence of arthropathy or other focal bone abnormality. Soft tissues are unremarkable. IMPRESSION: Negative. Electronically Signed   By: Zerita Boers M.D.   On: 07/29/2019 14:31   DG Chest Port 1 View  Result Date: 07/27/2019 CLINICAL DATA:  Foot pain and swelling with shortness of breath EXAM: PORTABLE CHEST 1 VIEW COMPARISON:  01/02/2019 FINDINGS: Cardiac shadow is stable. Mild vascular congestion is seen although improved from the prior exam. Loop recorder is again noted. No focal infiltrate is seen. IMPRESSION: Mild central vascular congestion but improved from the prior study. Electronically Signed   By: Inez Catalina M.D.   On: 07/27/2019 21:08   DG Shoulder Right Port  Result Date: 07/29/2019 CLINICAL DATA:  Chronic right shoulder pain EXAM: PORTABLE RIGHT SHOULDER COMPARISON:  None. FINDINGS: There is no evidence of fracture or dislocation. The humeral head is high riding within the glenohumeral joint, likely reflecting a rotator cuff tear  and/or degenerative change. IMPRESSION: 1. No acute fracture or dislocation. 2. High riding humeral head, likely reflecting a rotator cuff tear and/or degenerative change. Electronically Signed   By: Zerita Boers M.D.   On: 07/29/2019 14:29       Subjective: Patient seen and examined at the bedside this morning.  Hemodynamically  stable for discharge.  Discharge Exam: Vitals:   08/01/19 2045 08/02/19 0526  BP: 119/69 112/71  Pulse: 68 65  Resp: 18 18  Temp: 98.2 F (36.8 C) 98.2 F (36.8 C)  SpO2: 95% 93%   Vitals:   08/01/19 0703 08/01/19 1423 08/01/19 2045 08/02/19 0526  BP: 128/72 (!) 115/59 119/69 112/71  Pulse: 62 67 68 65  Resp: 14 18 18 18   Temp: 98.7 F (37.1 C) 98.4 F (36.9 C) 98.2 F (36.8 C) 98.2 F (36.8 C)  TempSrc: Oral Oral Oral Oral  SpO2: 95% 95% 95% 93%  Weight:      Height:        General: Pt is alert, awake, not in acute distress Cardiovascular: RRR, S1/S2 +, no rubs, no gallops Respiratory: CTA bilaterally, no wheezing, no rhonchi  Abdominal: Soft, NT, ND, bowel sounds + Extremities: trace bilateral lower extremity edema, no cyanosis    The results of significant diagnostics from this hospitalization (including imaging, microbiology, ancillary and laboratory) are listed below for reference.     Microbiology: Recent Results (from the past 240 hour(s))  SARS CORONAVIRUS 2 (TAT 6-24 HRS) Nasopharyngeal Nasopharyngeal Swab     Status: None   Collection Time: 07/27/19  9:37 PM   Specimen: Nasopharyngeal Swab  Result Value Ref Range Status   SARS Coronavirus 2 NEGATIVE NEGATIVE Final    Comment: (NOTE) SARS-CoV-2 target nucleic acids are NOT DETECTED. The SARS-CoV-2 RNA is generally detectable in upper and lower respiratory specimens during the acute phase of infection. Negative results do not preclude SARS-CoV-2 infection, do not rule out co-infections with other pathogens, and should not be used as the sole basis for treatment or other  patient management decisions. Negative results must be combined with clinical observations, patient history, and epidemiological information. The expected result is Negative. Fact Sheet for Patients: SugarRoll.be Fact Sheet for Healthcare Providers: https://www.woods-mathews.com/ This test is not yet approved or cleared by the [place] FDA and  has been authorized for detection and/or diagnosis of SARS-CoV-2 by FDA under an Emergency Use Authorization (EUA). This EUA will remain  in effect (meaning this test can be used) for the duration of the COVID-19 declaration under Section 56 4(b)(1) of the Act, 21 U.S.C. section 360bbb-3(b)(1), unless the authorization is terminated or revoked sooner. Performed at [facility], [place] [address] [place], [place] [address]   SARS CORONAVIRUS 2 (TAT 6-24 HRS) Nasopharyngeal Nasopharyngeal Swab     Status: None   Collection Time: 08/01/19 10:40 AM   Specimen: Nasopharyngeal Swab  Result Value Ref Range Status   SARS Coronavirus 2 NEGATIVE NEGATIVE Final    Comment: (NOTE) SARS-CoV-2 target nucleic acids are NOT DETECTED. The SARS-CoV-2 RNA is generally detectable in upper and lower respiratory specimens during the acute phase of infection. Negative results do not preclude SARS-CoV-2 infection, do not rule out co-infections with other pathogens, and should not be used as the sole basis for treatment or other patient management decisions. Negative results must be combined with clinical observations, patient history, and epidemiological information. The expected result is Negative. Fact Sheet for Patients: SugarRoll.be Fact Sheet for Healthcare Providers: https://www.woods-mathews.com/ This test is not yet approved or cleared by the [place] FDA and  has been authorized for detection and/or diagnosis of SARS-CoV-2 by FDA under an Emergency Use  Authorization (EUA). This EUA will remain  in effect (meaning this test can be used) for the duration of the COVID-19 declaration under Section 56 4(b)(1) of the Act, 21 U.S.C. section 360bbb-3(b)(1), unless the authorization is terminated or revoked sooner. Performed at [facility], [place] [address], [place], [place] [address]      Labs: BNP (last 3 results) Recent Labs    09/13/18 0407 01/02/19 1045 07/27/19 2049  BNP 21.6 22.3 67.2   Basic Metabolic Panel: Recent Labs  Lab 07/28/19 0447 07/28/19 2038 07/29/19 0534 07/30/19 0543 08/01/19 0515 08/02/19 0541  NA 136  --  136 134* 137 136  K 3.1* 3.8 3.8 3.8 3.4* 3.5  CL 92*  --  95* 93* 91* 93*  CO2 30  --  30 30 33* 31  GLUCOSE 142*  --  112* 108* 112* 119*  BUN 76*  --  65* 57* 51* 52*  CREATININE 3.06*  --  2.25* 1.80* 1.82* 1.55*  CALCIUM 8.2*  --  8.1* 8.2* 8.8* 8.8*  MG  --   --  1.8 1.9 1.7  --   PHOS  --   --  3.4  --   --   --    Liver Function Tests: Recent Labs  Lab 07/29/19 0534 08/01/19 0515  AST 27 20  ALT 34 21  ALKPHOS 217* 206*  BILITOT 1.1 0.6  PROT 6.3* 6.8  ALBUMIN 2.4* 2.4*   No results for input(s): LIPASE, AMYLASE in the last 168 hours. No results for input(s): AMMONIA in the last 168 hours. CBC: Recent Labs  Lab 07/27/19 1613 07/28/19 0447 07/29/19 0534 07/30/19 0543 08/01/19 0515  WBC 9.7 8.4 7.1 6.8 5.7  NEUTROABS  --   --   --   --  3.2  HGB 11.2* 10.2* 10.0* 10.0* 10.4*  HCT 35.7* 31.6* 31.1* 31.8* 33.2*  MCV 68.9* 68.7* 69.6* 69.7* 70.6*  PLT 206 187 218 254 281   Cardiac Enzymes: No results for input(s): CKTOTAL, CKMB, CKMBINDEX, TROPONINI in the last 168 hours. BNP: Invalid input(s): POCBNP CBG: No results for input(s): GLUCAP in the last 168 hours. D-Dimer No results for input(s): DDIMER in the last 72 hours. Hgb A1c No results for input(s): HGBA1C in the last 72 hours. Lipid Profile No results for input(s): CHOL, HDL, LDLCALC, TRIG, CHOLHDL, LDLDIRECT  in the last 72 hours. Thyroid function studies No results for input(s): TSH, T4TOTAL, T3FREE, THYROIDAB in the last 72 hours.  Invalid input(s): FREET3 Anemia work up No results for input(s): VITAMINB12, FOLATE, FERRITIN, TIBC, IRON, RETICCTPCT in the last 72 hours. Urinalysis    Component Value Date/Time   COLORURINE YELLOW 07/28/2019 2033   APPEARANCEUR CLEAR 07/28/2019 2033   LABSPEC 1.011 07/28/2019 2033   PHURINE 6.0 07/28/2019 2033   GLUCOSEU NEGATIVE 07/28/2019 2033   HGBUR NEGATIVE 07/28/2019 2033   BILIRUBINUR NEGATIVE 07/28/2019 2033   BILIRUBINUR Negative 07/18/2019 1126   KETONESUR NEGATIVE 07/28/2019 2033   PROTEINUR NEGATIVE 07/28/2019 2033   UROBILINOGEN 0.2 07/18/2019 1126   UROBILINOGEN 0.2 09/24/2017 1143   NITRITE NEGATIVE 07/28/2019 2033   LEUKOCYTESUR NEGATIVE 07/28/2019 2033   Sepsis Labs Invalid input(s): PROCALCITONIN,  WBC,  LACTICIDVEN Microbiology Recent Results (from the past 240 hour(s))  SARS CORONAVIRUS 2 (TAT 6-24 HRS) Nasopharyngeal Nasopharyngeal Swab     Status: None   Collection Time: 07/27/19  9:37 PM   Specimen: Nasopharyngeal Swab  Result Value Ref Range Status   SARS Coronavirus 2 NEGATIVE NEGATIVE Final    Comment: (NOTE) SARS-CoV-2 target nucleic acids are NOT DETECTED. The SARS-CoV-2 RNA is generally detectable in upper and lower respiratory specimens during the acute phase of infection. Negative results do not preclude SARS-CoV-2 infection, do not rule out co-infections with other pathogens, and should not be used as the sole basis for treatment or other patient management decisions. Negative results must be combined with clinical observations, patient history, and epidemiological information. The expected result is Negative. Fact Sheet for Patients: SugarRoll.be Fact Sheet for Healthcare Providers: https://www.woods-mathews.com/ This test is not yet approved or cleared by the [place] FDA and  has been authorized for detection and/or diagnosis of SARS-CoV-2 by FDA under an Emergency Use Authorization (EUA). This EUA will remain  in effect (meaning this test can be used) for the duration of the COVID-19 declaration under Section 56 4(b)(1) of the Act, 21 U.S.C. section 360bbb-3(b)(1), unless the authorization is terminated or revoked sooner. Performed at [facility], [place]  [address] [place], [place] [address]   SARS CORONAVIRUS 2 (TAT 6-24 HRS) Nasopharyngeal Nasopharyngeal Swab     Status: None   Collection Time: 08/01/19 10:40 AM   Specimen: Nasopharyngeal Swab  Result Value Ref Range Status   SARS Coronavirus 2 NEGATIVE NEGATIVE Final    Comment: (NOTE) SARS-CoV-2 target nucleic acids are NOT DETECTED. The SARS-CoV-2 RNA is generally detectable in upper and lower respiratory specimens during the acute phase of infection. Negative results do not preclude SARS-CoV-2 infection, do not rule out co-infections with other pathogens, and should not be used as the sole basis for treatment or other patient management decisions. Negative results must be combined with clinical observations, patient history, and epidemiological information. The expected result is Negative. Fact Sheet for Patients: SugarRoll.be Fact Sheet for Healthcare Providers: https://www.woods-mathews.com/ This test is not yet approved or cleared by the [place] FDA and  has been authorized for detection and/or diagnosis of SARS-CoV-2 by FDA under an Emergency Use Authorization (EUA). This EUA will remain  in effect (meaning this test can be used) for the duration of the COVID-19 declaration under Section 56 4(b)(1) of the Act, 21 U.S.C. section 360bbb-3(b)(1), unless the authorization is terminated or revoked sooner. Performed at [facility], [place] [address], [place], [place] [address]     Please note: You were cared for by a  hospitalist during your hospital stay. Once you are discharged, your primary care physician will handle any further medical issues. Please note that NO REFILLS for any discharge medications will be authorized once you are discharged, as it is imperative that you return to your primary care physician (or establish a relationship with a primary care physician if you do not have one) for your post hospital discharge needs so that they can reassess your need for medications and monitor your lab values.    Time coordinating discharge: 40 minutes  SIGNED:   Shelly Coss, MD  [facility] 08/02/2019, 10:53 AM Pager [phone]  If 7PM-7AM, please contact night-coverage [email or web address] Password [email or web address]

## 2019-08-02 NOTE — Progress Notes (Signed)
Pt discharged to [facility] via [facility] in stable condition. Discharge instructions given to Kenney Houseman, RN at facility. No immediate questions or concerns.

## 2019-08-02 NOTE — Consult Note (Signed)
   [facility] CM Inpatient Consult   08/02/2019  Daphne Karrer [birth year] [phone]   Patient screened for potential [facility] Care Management services. Patient had been engaged with a [facility] community RN in the past for chronic disease management services.   Per chart review, current disposition plan is for SNF. No [facility] CM needs at this time.  Netta Cedars, MSN, [facility] Liaison Nurse Mobile Phone [phone]  Toll free office [phone]

## 2019-08-02 NOTE — Progress Notes (Signed)
Report called to Olivia Mackie, RN at [facility]. Awaiting [facility] for pt transport

## 2019-08-02 NOTE — TOC Transition Note (Addendum)
Transition of Care [facility]) - CM/SW Discharge Note   Patient Details  Name: Pamela [name] MRN: [id number] Date of Birth: March 11, 1951  Transition [facility] CM/SW Contact:  Lynnell Catalan, RN Phone Number: 08/02/2019, 11:38 AM   Clinical Narrative:     Pt to go to [facility] room 104a. RN to call report to [phone]. Pt to transfer to [facility] via [place].  Final next level of care: Skilled Nursing Facility Barriers to Discharge: No Barriers Identified   Patient Goals and CMS Choice Patient states their goals for this hospitalization and ongoing recovery are:: To move better CMS Medicare.gov Compare Post Acute Care list provided to:: Patient Choice offered to / list presented to : Patient  Discharge Placement              Patient chooses bed at: [facility] Patient to be transferred to facility by: PT[facility]      Discharge Plan and Services   Discharge Planning Services: CM Consult                                 Social Determinants of Health (SDOH) Interventions     Readmission Risk Interventions Readmission Risk Prevention Plan 01/05/2019  Transportation Screening Complete  Medication Review ([place]) Complete  PCP or Specialist appointment within 3-5 days of discharge Complete  HRI or Home Care Consult Patient refused  SW Recovery Care/Counseling Consult Complete  [place] Not Applicable  Some recent data might be hidden

## 2019-08-04 DIAGNOSIS — I5032 Chronic diastolic (congestive) heart failure: Secondary | ICD-10-CM | POA: Diagnosis not present

## 2019-08-04 DIAGNOSIS — I10 Essential (primary) hypertension: Secondary | ICD-10-CM | POA: Diagnosis not present

## 2019-08-04 DIAGNOSIS — N1831 Chronic kidney disease, stage 3a: Secondary | ICD-10-CM | POA: Diagnosis not present

## 2019-08-06 LAB — CUP PACEART REMOTE DEVICE CHECK
Date Time Interrogation Session: 20210116165550
Implantable Pulse Generator Implant Date: 20200220

## 2019-08-07 ENCOUNTER — Ambulatory Visit (INDEPENDENT_AMBULATORY_CARE_PROVIDER_SITE_OTHER): Payer: Medicare Other | Admitting: *Deleted

## 2019-08-07 DIAGNOSIS — I639 Cerebral infarction, unspecified: Secondary | ICD-10-CM

## 2019-08-08 NOTE — Progress Notes (Signed)
ILR remote 

## 2019-08-10 ENCOUNTER — Other Ambulatory Visit: Payer: Self-pay | Admitting: *Deleted

## 2019-08-10 NOTE — Patient Outreach (Signed)
Screened for potential [facility] Care Management needs as a benefit of  [employer] Medicare.  Member is currently receiving skilled therapy at [facility].   Writer attended telephonic interdisciplinary team meeting to assess for disposition needs and transition plan for resident.   Facility staff report disposition plan is to return home with son and family. Likely transition home in a week or two. [facility] received referral on member prior to admission. Not engaged yet.   Writer to plan outreach to [employer] regarding [employer] engagement.  Will continue to collaborate with facility, [facility], and [facility] while Ms. Carraway resides in SNF.    Marthenia Rolling, MSN-Ed, RN,BSN [place] Acute Care Coordinator [phone] [facility]) [phone]  (Toll free office)

## 2019-08-17 ENCOUNTER — Other Ambulatory Visit: Payer: Self-pay | Admitting: *Deleted

## 2019-08-17 NOTE — Patient Outreach (Signed)
Screened for potential [facility] Care Management needs as a benefit of  [employer] Medicare.  Member is currently receiving skilled therapy at [facility].   Earlier Editor, commissioning attended telephonic interdisciplinary team meeting to assess for disposition needs and transition plan for resident.   Facility reports member is slated for dc on next Tuesday 08/22/19 to home with family. States member is alone during the day.   Writer will plan outreach to member/son to discuss [facility] follow up and potential [facility] Care Management needs.  Marthenia Rolling, MSN-Ed, RN,BSN [place] Acute Care Coordinator [phone] [facility]) [phone]  (Toll free office)

## 2019-08-18 ENCOUNTER — Other Ambulatory Visit: Payer: Self-pay | Admitting: *Deleted

## 2019-08-18 DIAGNOSIS — I129 Hypertensive chronic kidney disease with stage 1 through stage 4 chronic kidney disease, or unspecified chronic kidney disease: Secondary | ICD-10-CM | POA: Diagnosis not present

## 2019-08-18 DIAGNOSIS — M6281 Muscle weakness (generalized): Secondary | ICD-10-CM | POA: Diagnosis not present

## 2019-08-18 DIAGNOSIS — R2689 Other abnormalities of gait and mobility: Secondary | ICD-10-CM | POA: Diagnosis not present

## 2019-08-18 DIAGNOSIS — I5032 Chronic diastolic (congestive) heart failure: Secondary | ICD-10-CM | POA: Diagnosis not present

## 2019-08-18 DIAGNOSIS — N183 Chronic kidney disease, stage 3 unspecified: Secondary | ICD-10-CM | POA: Diagnosis not present

## 2019-08-18 DIAGNOSIS — I110 Hypertensive heart disease with heart failure: Secondary | ICD-10-CM | POA: Diagnosis not present

## 2019-08-18 NOTE — Patient Outreach (Signed)
Member screened for potential [facility] Care Management needs as a benefit of [place] Medicare.  Received returned call from Cletis [place] (son). Patient identifiers confirmed.  Mr. Pontarelli indicates member will transition to home next week and that she is progressing well with therapy.   Gave a brief overview of [facility] program. Mr. Hullum is agreeable to [facility] services. Made him aware that [facility] will contact him to discuss their program further.   Mr. Brisbon endorses that member is home alone during the day. Suggested that Mr. Dolecki consider a medical alert system for member. [place] states "we have thought about it."  Discussed the need for meals since member is home alone during the day. [place] thinks meals will be a good idea. He reports member uses [employer] for transportation but would be interested if there was any other transportation assistance available. Agreeble to [facility] Social Worker referral. Explained that Probation officer was not sure about transportation assistance since member has [employer] services. However, he can discuss further with [facility] Education officer, museum.   Explained the above services will not interfere or replace home health. [place] expressed understanding.   Will send [facility] website and contact information to son via email.  Notification sent to [facility] for pending dc on 08/22/19. Will make referral to [place] Social Worker for meals assistance.   Confirmed with son that Laci Niccoli's cell number is [phone] and [place] cell is [phone].    Marthenia Rolling, MSN-Ed, RN,BSN [place] Acute Care Coordinator [phone] [facility]) [phone]  (Toll free office)

## 2019-08-18 NOTE — Patient Outreach (Signed)
Member screened for potential [facility] Care Management needs as a benefit of [place] Medicare.  Pamela [name] is currently receiving skilled therapy at [facility].   Facility previously reported Pamela [name] will transition to home next week 08/22/19 with family. Facility states member will be home alone during the day. Facility  reports member has progressed well with therapy.  Telephone call made to all of the numbers on file to speak with son about [facility] services and [facility] engagement. No answer. Left HIPAA compliant voicemail messages for Pamela [name] (son) requesting return call. Unable to leave message for listed home phone.   If no return call, will plan outreach again.   Marthenia Rolling, MSN-Ed, RN,BSN [place] Acute Care Coordinator [phone] [facility]) [phone]  (Toll free office)

## 2019-08-21 ENCOUNTER — Other Ambulatory Visit: Payer: Self-pay | Admitting: *Deleted

## 2019-08-21 DIAGNOSIS — I10 Essential (primary) hypertension: Secondary | ICD-10-CM

## 2019-08-21 NOTE — Patient Outreach (Signed)
[facility] RN Post Acute Care Coordinator follow up.  Ms. Williamson is currently receiving skilled therapy at [facility]. Spoke with member's son Arvella Nigh last week.   Telephone call to [facility] daughter in law thinking it was member's mobile. Daughter in law provided member's mobile as [phone].   Telephone call made to Ms. Frimpong at [phone]. Patient identifiers confirmed. Explained [facility] follow up. Ms. Trott is agreeable. She is also agreeable for [facility] Care Management services for meals. Confirmed best contact number is home number [phone]. Ms. Aguado confirms transition to home slated for tomorrow 08/22/19.   Facility [employer] planner indicates Ms. Skorupski will have [facility] home health at dc. Updated [facility]. Will make referral to [place] for meal assistance.   Marthenia Rolling, MSN-Ed, RN,BSN [place] Acute Care Coordinator [phone] [facility]) [phone]  (Toll free office)

## 2019-08-22 ENCOUNTER — Other Ambulatory Visit: Payer: Self-pay

## 2019-08-22 NOTE — Patient Outreach (Signed)
[facility]) Care Management  08/22/2019  Colin Norment 07/24/1950 [id number]   High priority Social Work referral received today from [place], Marthenia Rolling.  "Please assign to [place] for meal assistance. Will dc from [facility] on 08/22/19. Will have Remote Health follow up at transition. Will have [facility]. Hence, Probation officer not making [facility] RNCM referral at this time" Attempted to contact patient today via mobile number but no answer or option to leave message Will attempt to reach again tomorrow.  Ronn Melena, BSW Social Worker [phone]

## 2019-08-23 ENCOUNTER — Other Ambulatory Visit: Payer: Self-pay

## 2019-08-23 DIAGNOSIS — Z6841 Body Mass Index (BMI) 40.0 and over, adult: Secondary | ICD-10-CM | POA: Diagnosis not present

## 2019-08-23 DIAGNOSIS — R5381 Other malaise: Secondary | ICD-10-CM | POA: Diagnosis not present

## 2019-08-23 DIAGNOSIS — I130 Hypertensive heart and chronic kidney disease with heart failure and stage 1 through stage 4 chronic kidney disease, or unspecified chronic kidney disease: Secondary | ICD-10-CM | POA: Diagnosis not present

## 2019-08-23 DIAGNOSIS — Z8616 Personal history of COVID-19: Secondary | ICD-10-CM | POA: Diagnosis not present

## 2019-08-23 DIAGNOSIS — Z7951 Long term (current) use of inhaled steroids: Secondary | ICD-10-CM | POA: Diagnosis not present

## 2019-08-23 DIAGNOSIS — Z8673 Personal history of transient ischemic attack (TIA), and cerebral infarction without residual deficits: Secondary | ICD-10-CM | POA: Diagnosis not present

## 2019-08-23 DIAGNOSIS — Z9181 History of falling: Secondary | ICD-10-CM | POA: Diagnosis not present

## 2019-08-23 DIAGNOSIS — Z7982 Long term (current) use of aspirin: Secondary | ICD-10-CM | POA: Diagnosis not present

## 2019-08-23 DIAGNOSIS — E876 Hypokalemia: Secondary | ICD-10-CM | POA: Diagnosis not present

## 2019-08-23 DIAGNOSIS — J9611 Chronic respiratory failure with hypoxia: Secondary | ICD-10-CM | POA: Diagnosis not present

## 2019-08-23 DIAGNOSIS — Z87440 Personal history of urinary (tract) infections: Secondary | ICD-10-CM | POA: Diagnosis not present

## 2019-08-23 DIAGNOSIS — J45909 Unspecified asthma, uncomplicated: Secondary | ICD-10-CM | POA: Diagnosis not present

## 2019-08-23 DIAGNOSIS — N1831 Chronic kidney disease, stage 3a: Secondary | ICD-10-CM | POA: Diagnosis not present

## 2019-08-23 DIAGNOSIS — I5032 Chronic diastolic (congestive) heart failure: Secondary | ICD-10-CM | POA: Diagnosis not present

## 2019-08-23 DIAGNOSIS — N179 Acute kidney failure, unspecified: Secondary | ICD-10-CM | POA: Diagnosis not present

## 2019-08-23 DIAGNOSIS — D631 Anemia in chronic kidney disease: Secondary | ICD-10-CM | POA: Diagnosis not present

## 2019-08-23 DIAGNOSIS — J9612 Chronic respiratory failure with hypercapnia: Secondary | ICD-10-CM | POA: Diagnosis not present

## 2019-08-23 DIAGNOSIS — I890 Lymphedema, not elsewhere classified: Secondary | ICD-10-CM | POA: Diagnosis not present

## 2019-08-23 NOTE — Patient Outreach (Signed)
[facility] [facility]) Care Management  08/23/2019  Pamela [name] 1950-08-07 [id number]   Successful outreach to patient regarding referral to Social Work for meal assistance post SNF discharge.  Informed patient of temporary meal delivery through [employer] and she consented to referral being submitted. Patient will receive a total of 28 prepared meals over the next four weeks.    Ronn Melena, BSW Social Worker [phone]

## 2019-08-24 ENCOUNTER — Telehealth: Payer: Self-pay | Admitting: Family Medicine

## 2019-08-24 NOTE — Telephone Encounter (Signed)
[facility] called for orders for this patient. Phone [phone].

## 2019-08-25 DIAGNOSIS — I130 Hypertensive heart and chronic kidney disease with heart failure and stage 1 through stage 4 chronic kidney disease, or unspecified chronic kidney disease: Secondary | ICD-10-CM | POA: Diagnosis not present

## 2019-08-25 DIAGNOSIS — N1831 Chronic kidney disease, stage 3a: Secondary | ICD-10-CM | POA: Diagnosis not present

## 2019-08-25 DIAGNOSIS — I5032 Chronic diastolic (congestive) heart failure: Secondary | ICD-10-CM | POA: Diagnosis not present

## 2019-08-25 DIAGNOSIS — R5381 Other malaise: Secondary | ICD-10-CM | POA: Diagnosis not present

## 2019-08-25 DIAGNOSIS — N179 Acute kidney failure, unspecified: Secondary | ICD-10-CM | POA: Diagnosis not present

## 2019-08-25 DIAGNOSIS — D631 Anemia in chronic kidney disease: Secondary | ICD-10-CM | POA: Diagnosis not present

## 2019-08-25 NOTE — Telephone Encounter (Signed)
Called and spoke with Shanon Brow, He was requesting continuing of physical therapy for patient. I gave verbal orders for this to continue. Thanks!

## 2019-08-28 DIAGNOSIS — R5381 Other malaise: Secondary | ICD-10-CM | POA: Diagnosis not present

## 2019-08-28 DIAGNOSIS — N1831 Chronic kidney disease, stage 3a: Secondary | ICD-10-CM | POA: Diagnosis not present

## 2019-08-28 DIAGNOSIS — N179 Acute kidney failure, unspecified: Secondary | ICD-10-CM | POA: Diagnosis not present

## 2019-08-28 DIAGNOSIS — I5032 Chronic diastolic (congestive) heart failure: Secondary | ICD-10-CM | POA: Diagnosis not present

## 2019-08-28 DIAGNOSIS — D631 Anemia in chronic kidney disease: Secondary | ICD-10-CM | POA: Diagnosis not present

## 2019-08-28 DIAGNOSIS — I130 Hypertensive heart and chronic kidney disease with heart failure and stage 1 through stage 4 chronic kidney disease, or unspecified chronic kidney disease: Secondary | ICD-10-CM | POA: Diagnosis not present

## 2019-08-29 ENCOUNTER — Other Ambulatory Visit: Payer: Self-pay

## 2019-08-29 ENCOUNTER — Ambulatory Visit (INDEPENDENT_AMBULATORY_CARE_PROVIDER_SITE_OTHER): Payer: Medicare Other | Admitting: Family Medicine

## 2019-08-29 ENCOUNTER — Encounter: Payer: Self-pay | Admitting: Family Medicine

## 2019-08-29 VITALS — BP 118/72 | HR 71 | Temp 98.1°F | Resp 22 | Ht 66.0 in | Wt 346.0 lb

## 2019-08-29 DIAGNOSIS — J4520 Mild intermittent asthma, uncomplicated: Secondary | ICD-10-CM

## 2019-08-29 DIAGNOSIS — D638 Anemia in other chronic diseases classified elsewhere: Secondary | ICD-10-CM

## 2019-08-29 DIAGNOSIS — N183 Chronic kidney disease, stage 3 unspecified: Secondary | ICD-10-CM

## 2019-08-29 DIAGNOSIS — D869 Sarcoidosis, unspecified: Secondary | ICD-10-CM

## 2019-08-29 DIAGNOSIS — I639 Cerebral infarction, unspecified: Secondary | ICD-10-CM | POA: Diagnosis not present

## 2019-08-29 DIAGNOSIS — I10 Essential (primary) hypertension: Secondary | ICD-10-CM | POA: Diagnosis not present

## 2019-08-29 DIAGNOSIS — G4733 Obstructive sleep apnea (adult) (pediatric): Secondary | ICD-10-CM

## 2019-08-29 DIAGNOSIS — I5032 Chronic diastolic (congestive) heart failure: Secondary | ICD-10-CM

## 2019-08-29 NOTE — Progress Notes (Signed)
Patient [name]   Subjective:  Patient ID: Pamela [name], female    DOB: 1951/07/04  Age: 69 y.o. MRN: [id number]  CC:  Chief Complaint  Patient presents with  . Hospitalization Follow-up    for allergic reaction   . Cough    HPI Pamela [name] is a very pleasant, a 69 year old female with a medical history significant for right upper lobe adenocarcinoma, history of mild intermittent asthma, history of gouty arthritis, type II DM, hypertension, COPD, congenital single kidney, and history of congestive heart failure presents for a posthospitalization follow-up.  [name] was admitted to inpatient services on 07/27/2019 following treatment of urinary tract infection with a sulfa drug.  Patient is highly allergic to sulfa.  She has a significant history of chronic kidney disease with a baseline creatinine of 2.  Patient was prescribed Bactrim, however but she did realize the Bactrim was a sulfa drug.  She took the drug for 3 days and then experienced highs and bilateral lower extremity swelling.  Patient was admitted to inpatient services and found to have acute kidney injury with an elevated creatinine of 3.56.  Patient was treated for total of 6 days with gentle hydration.  Since hospitalization she has avoided all nephrotoxic medications.  Lower extremity swelling has improved some.  Patient has not been able to get a follow-up appointment with nephrology for this problem. She denies fever, chills, chest pain, fatigue, urinary symptoms, low back pain, nausea, vomiting, or diarrhea.  She endorses some wheezing and some shortness of breath with exertion.  Past Medical History:  Diagnosis Date  . Arthritis   . Asthma   . Cancer ([place])    lung, adenocarcinoma right lung 2012  . CHF (congestive heart failure) (H[facility])    Preserved EF  . Congenital single kidney    With chronic kidney disease  . COPD (chronic obstructive pulmonary disease) ([place])     . Gout   . Hypertension   . Lung cancer [facility]) 2012   Right upper lobe lung adenocarcinoma diagnosed with needle biopsy treated by SBRT finished treatment April 2013 has been monitored since  . Mass of chest wall, right    Right chest wall mass 7.3 cm biopsy on 12/13/2013. Patient notes it was consistent with sarcoidosis but actual pathology results not available.  . Oxygen deficiency   . Sarcoidosis   . Sickle cell trait ([name])   . Sleep apnea     Past Surgical History:  Procedure Laterality Date  . LOOP RECORDER INSERTION N/A 09/08/2018   Procedure: LOOP RECORDER INSERTION;  Surgeon: Evans Lance, MD;  Location: [place] CV LAB;  Service: Cardiovascular;  Laterality: N/A;  . LUNG BIOPSY    . TEE WITHOUT CARDIOVERSION N/A 09/08/2018   Procedure: TRANSESOPHAGEAL ECHOCARDIOGRAM (TEE);  Surgeon: Josue Hector, MD;  Location: [facility] ENDOSCOPY;  Service: Cardiovascular;  Laterality: N/A;  with loop  . TUBAL LIGATION    . VEIN LIGATION AND STRIPPING      Family History  Problem Relation Age of Onset  . Hypertension Mother   . Renal Disease Mother   . Cancer Father        stomach  . Heart disease Father        No details  . Cancer Brother   . Diabetes Brother   . Cervical cancer Sister   . Diabetes Sister   . Multiple myeloma Sister     Social History   Socioeconomic  History  . Marital status: Divorced    Spouse name: Not on file  . Number of children: 2  . Years of education: Not on file  . Highest education level: Not on file  Occupational History  . Not on file  Tobacco Use  . Smoking status: Never Smoker  . Smokeless tobacco: Never Used  Substance and Sexual Activity  . Alcohol use: No    Alcohol/week: 0.0 standard drinks  . Drug use: No  . Sexual activity: Not Currently  Other Topics Concern  . Not on file  Social History Narrative   Lives with son.  One son is deceased.     Social Determinants of Health   Financial Resource Strain:   . Difficulty of  Paying Living Expenses: Not on file  Food Insecurity:   . Worried About Charity fundraiser in the Last Year: Not on file  . Ran Out of Food in the Last Year: Not on file  Transportation Needs:   . Lack of Transportation (Medical): Not on file  . Lack of Transportation (Non-Medical): Not on file  Physical Activity:   . Days of Exercise per Week: Not on file  . Minutes of Exercise per Session: Not on file  Stress:   . Feeling of Stress : Not on file  Social Connections:   . Frequency of Communication with Friends and Family: Not on file  . Frequency of Social Gatherings with Friends and Family: Not on file  . Attends Religious Services: Not on file  . Active Member of Clubs or Organizations: Not on file  . Attends Archivist Meetings: Not on file  . Marital Status: Not on file  Intimate Partner Violence:   . Fear of Current or Ex-Partner: Not on file  . Emotionally Abused: Not on file  . Physically Abused: Not on file  . Sexually Abused: Not on file    Outpatient Medications Prior to Visit  Medication Sig Dispense Refill  . acetaminophen (TYLENOL) 500 MG tablet Take 1,000 mg by mouth every 6 (six) hours as needed for mild pain.     [name] Kitchen albuterol (PROAIR HFA) 108 (90 Base) MCG/ACT inhaler Inhale 2 puffs into the lungs every 6 (six) hours as needed for wheezing or shortness of breath. 3 Inhaler 1  . albuterol (PROVENTIL) (2.5 MG/3ML) 0.083% nebulizer solution Take 3 mLs (2.5 mg total) by nebulization every 6 (six) hours as needed for wheezing or shortness of breath. 100 mL 0  . aspirin 325 MG tablet Take 1 tablet (325 mg total) by mouth daily. 30 tablet 0  . atorvastatin (LIPITOR) 40 MG tablet Take 1 tablet (40 mg total) by mouth daily at 6 PM. 90 tablet 1  . budesonide-formoterol (SYMBICORT) 160-4.5 MCG/ACT inhaler Inhale 2 puffs into the lungs 2 (two) times daily. 18 g 5  . calcitRIOL (ROCALTROL) 0.25 MCG capsule Take 1 capsule (0.25 mcg total) by mouth daily. 30 capsule 0    . calcium carbonate (TUMS - DOSED IN MG ELEMENTAL CALCIUM) 500 MG chewable tablet Chew 1-2 tablets by mouth daily as needed for indigestion (gas).     [name] Kitchen dextromethorphan (DELSYM) 30 MG/5ML liquid Take by mouth as needed for cough.    . docusate sodium (COLACE) 100 MG capsule Take 1 capsule (100 mg total) by mouth 2 (two) times daily. 60 capsule 0  . febuxostat (ULORIC) 40 MG tablet Take 1 tablet (40 mg total) by mouth daily. 90 tablet 1  . furosemide (LASIX) 40 MG  tablet Take 1 tablet (40 mg total) by mouth 2 (two) times daily. For CHF 180 tablet 1  . montelukast (SINGULAIR) 10 MG tablet Take 1 tablet (10 mg total) by mouth at bedtime. 90 tablet 1   No facility-administered medications prior to visit.    Allergies  Allergen Reactions  . Sulfa Antibiotics Hives and Rash    ROS Review of Systems  Constitutional: Negative.  Negative for activity change, appetite change and fever.  HENT: Negative.   Respiratory: Positive for shortness of breath and wheezing (occasional wheezing). Negative for cough and chest tightness.   Cardiovascular: Negative.  Negative for chest pain and leg swelling.  Endocrine: Negative.   Genitourinary: Negative.   Musculoskeletal: Negative.   Skin: Negative.   Neurological: Negative.   Hematological: Negative.   Psychiatric/Behavioral: Negative.       Objective:    Physical Exam  Constitutional: She is oriented to person, place, and time. She appears well-developed and well-nourished.  Morbid obesity  HENT:  Head: Normocephalic and atraumatic.  Eyes: Pupils are equal, round, and reactive to light.  Cardiovascular: Normal rate, regular rhythm and S1 normal.  2+ bilateral lower extremity edema  Pulmonary/Chest: Effort normal. She has wheezes.  Abdominal: Bowel sounds are normal. She exhibits distension.  Musculoskeletal:        General: Normal range of motion.     Cervical back: Normal range of motion and neck supple.  Neurological: She is alert and  oriented to person, place, and time. No cranial nerve deficit. Coordination normal.  Skin: Skin is warm and dry.  Psychiatric: She has a normal mood and affect. Her behavior is normal. Judgment and thought content normal.    BP 118/72 (BP Location: Left Wrist, Patient Position: Sitting, Cuff Size: Normal)   Pulse 71   Temp 98.1 F (36.7 C) (Oral)   Resp (!) 22   Ht _0  (1.676 m)   Wt (!) 346 lb (156.9 kg)   BMI 55.85 kg/m  Wt Readings from Last 3 Encounters:  08/29/19 (!) 346 lb (156.9 kg)  07/28/19 (!) 343 lb 4.1 oz (155.7 kg)  07/18/19 (!) 351 lb 12.8 oz (159.6 kg)     Health Maintenance Due  Topic Date Due  . PNA vac Low Risk Adult (2 of 2 - PPSV23) 04/14/2019    There are no preventive care reminders to display for this patient.  Lab Results  Component Value Date   TSH 3.934 05/21/2014   Lab Results  Component Value Date   WBC 5.7 08/01/2019   HGB 10.4 (L) 08/01/2019   HCT 33.2 (L) 08/01/2019   MCV 70.6 (L) 08/01/2019   PLT 281 08/01/2019   Lab Results  Component Value Date   NA 136 08/02/2019   K 3.5 08/02/2019   CHLORIDE 103 05/31/2017   CO2 31 08/02/2019   GLUCOSE 119 (H) 08/02/2019   BUN 52 (H) 08/02/2019   CREATININE 1.55 (H) 08/02/2019   BILITOT 0.6 08/01/2019   ALKPHOS 206 (H) 08/01/2019   AST 20 08/01/2019   ALT 21 08/01/2019   PROT 6.8 08/01/2019   ALBUMIN 2.4 (L) 08/01/2019   CALCIUM 8.8 (L) 08/02/2019   ANIONGAP 12 08/02/2019   EGFR 38 (L) 05/31/2017   Lab Results  Component Value Date   CHOL 130 03/01/2019   Lab Results  Component Value Date   HDL 48 03/01/2019   Lab Results  Component Value Date   LDLCALC 61 03/01/2019   Lab Results  Component Value Date  TRIG 105 03/01/2019   Lab Results  Component Value Date   CHOLHDL 2.7 03/01/2019   Lab Results  Component Value Date   HGBA1C 6.1 (H) 03/01/2019      Assessment & Plan:   Problem List Items Addressed This Visit      Cardiovascular and Mediastinum    Essential hypertension   Relevant Orders   Comprehensive metabolic panel (Completed)     Respiratory   Asthma, chronic     Other   Sarcoidosis   Anemia of chronic disease   Relevant Orders   CBC with Differential (Completed)    Other Visit Diagnoses    Stage 3 chronic kidney disease, unspecified whether stage 3a or 3b CKD    -  Primary   Relevant Orders   Uric Acid (Completed)   Microalbumin/Creatinine Ratio, Urine (Completed)       Essential hypertension BP 118/72 (BP Location: Left Wrist, Patient Position: Sitting, Cuff Size: Normal)   Pulse 71   Temp 98.1 F (36.7 C) (Oral)   Resp (!) 22   Ht _0  (1.676 m)   Wt (!) 346 lb (156.9 kg)   BMI 55.85 kg/m   Continue medication, monitor blood pressure at home. Continue DASH diet. Reminder to go to the ER if any CP, SOB, nausea, dizziness, severe HA, changes vision/speech, left arm numbness and tingling and jaw pain.   - Comprehensive metabolic panel   Stage 3 chronic kidney disease, unspecified whether stage 3a or 3b CKD Will arrange first available appointment to nephrologist.  Follow creatinine and GFR closely, repeat lab in 1 month.  - Uric Acid - Microalbumin/Creatinine Ratio, Urine   Anemia of chronic disease  - CBC with Differential   Mild intermittent chronic asthma without complication Stable. Greater than 1 year since last exacerbation.   Sarcoidosis Follow up with pulmonologist as scheduled.   Follow-up: Return in about 1 month (around 09/26/2019).    [name]  APRN, MSN, FNP-C [facility] [address] [place], [place] [address] [phone]

## 2019-08-29 NOTE — Patient Instructions (Signed)
Food Basics for Chronic Kidney Disease When your kidneys are not working well, they cannot remove waste and excess substances from your blood as effectively as they did before. This can lead to a buildup and imbalance of these substances, which can worsen kidney damage and affect how your body functions. Certain foods lead to a buildup of these substances in the body. By changing your diet as recommended by your diet and nutrition specialist (dietitian) or health care provider, you could help prevent further kidney damage and delay or prevent the need for dialysis. What are tips for following this plan? General instructions   Work with your health care provider and dietitian to develop a meal plan that is right for you. Foods you can eat, limit, or avoid will be different for each person depending on the stage of kidney disease and any other existing health conditions.  Talk with your health care provider about whether you should take a vitamin and mineral supplement.  Use standard measuring cups and spoons to measure servings of foods. Use a kitchen scale to measure portions of protein foods.  If directed by your health care provider, avoid drinking too much fluid. Measure and count all liquids, including water, ice, soups, flavored gelatin, and frozen desserts such as popsicles or ice cream. Reading food labels  Check the amount of sodium in foods. Choose foods that have less than 300 milligrams (mg) per serving.  Check the ingredient list for phosphorus or potassium-based additives or preservatives.  Check the amount of saturated and trans fat. Limit or avoid these fats as told by your dietitian. Shopping  Avoid buying foods that are: ? Processed, frozen, or prepackaged. ? Calcium-enriched or fortified.  Do not buy foods that have salt or sodium listed among the first five ingredients.  Do not buy canned vegetables. Cooking  Replace animal proteins, such as meat, fish, eggs, or  dairy, with plant proteins from beans, nuts, and soy. ? Use soy milk instead of cow's milk. ? Add beans or tofu to soups, casseroles, or pasta dishes instead of meat.  Soak vegetables, such as potatoes, before cooking to reduce potassium. To do this: ? Peel and cut into small pieces. ? Soak in warm water for at least 2 hours. For every 1 cup of vegetables, use 10 cups of water. ? Drain and rinse with warm water. ? Boil for at least 5 minutes. Meal planning  Limit the amount of protein from plant and animal sources you eat each day.  Do not add salt to food when cooking or before eating.  Eat meals and snacks at around the same time each day. If you have diabetes:  If you have diabetes (diabetes mellitus) and chronic kidney disease, it is important to keep your blood glucose in the target range recommended by your health care provider. Follow your diabetes management plan. This may include: ? Checking your blood glucose regularly. ? Taking oral medicines, insulin, or both. ? Exercising for at least 30 minutes on 5 or more days each week, or as told by your health care provider. ? Tracking how many servings of carbohydrates you eat at each meal.  You may be given specific guidelines on how much of certain foods and nutrients you may eat, depending on your stage of kidney disease and whether you have high blood pressure (hypertension). Follow your meal plan as told by your dietitian. What nutrients should be limited? The items listed are not a complete list. Talk with your dietitian  about what dietary choices are best for you. Potassium Potassium affects how steadily your heart beats. If too much potassium builds up in your blood, it can cause an irregular heartbeat or even a heart attack. You may need to eat less potassium, depending on your blood potassium levels and the stage of kidney disease. Talk to your dietitian about how much potassium you may have each day. You may need to limit  or avoid foods that are high in potassium, such as:  Milk and soy milk.  Fruits, such as bananas, papaya, apricots, nectarines, melon, prunes, raisins, kiwi, and oranges.  Vegetables, such as potatoes, sweet potatoes, yams, tomatoes, leafy greens, beets, okra, avocado, pumpkin, and winter squash.  White and lima beans. Phosphorus Phosphorus is a mineral found in your bones. A balance between calcium and phosphorous is needed to build and maintain healthy bones. Too much phosphorus pulls calcium from your bones. This can make your bones weak and more likely to break. Too much phosphorus can also make your skin itch. You may need to eat less phosphorus depending on your blood phosphorus levels and the stage of kidney disease. Talk to your dietitian about how much potassium you may have each day. You may need to take medicine to lower your blood phosphorus levels if diet changes do not help. You may need to limit or avoid foods that are high in phosphorus, such as:  Milk and dairy products.  Dried beans and peas.  Tofu, soy milk, and other soy-based meat replacements.  Colas.  Nuts and peanut butter.  Meat, poultry, and fish.  Bran cereals and oatmeals. Protein Protein helps you to make and keep muscle. It also helps in the repair of your body's cells and tissues. One of the natural breakdown products of protein is a waste product called urea. When your kidneys are not working properly, they cannot remove wastes, such as urea, like they did before you developed chronic kidney disease. Reducing how much protein you eat can help prevent a buildup of urea in your blood. Depending on your stage of kidney disease, you may need to limit foods that are high in protein. Sources of animal protein include:  Meat (all types).  Fish and seafood.  Poultry.  Eggs.  Dairy. Other protein foods include:  Beans and legumes.  Nuts and nut butter.  Soy and tofu. Sodium Sodium, which is found  in salt, helps maintain a healthy balance of fluids in your body. Too much sodium can increase your blood pressure and have a negative effect on the function of your heart and lungs. Too much sodium can also cause your body to retain too much fluid, making your kidneys work harder. Most people should have less than 2,300 milligrams (mg) of sodium each day. If you have hypertension, you may need to limit your sodium to 1,500 mg each day. Talk to your dietitian about how much sodium you may have each day. You may need to limit or avoid foods that are high in sodium, such as:  Salt seasonings.  Soy sauce.  Cured and processed meats.  Salted crackers and snack foods.  Fast food.  Canned soups and most canned foods.  Pickled foods.  Vegetable juice.  Boxed mixes or ready-to-eat boxed meals and side dishes.  Bottled dressings, sauces, and marinades. Summary  Chronic kidney disease can lead to a buildup and imbalance of waste and excess substances in the body. Certain foods lead to a buildup of these substances. By adjusting  your intake of these foods, you could help prevent more kidney damage and delay or prevent the need for dialysis.  Food adjustments are different for each person with chronic kidney disease. Work with a dietitian to set up nutrient goals and a meal plan that is right for you.  If you have diabetes and chronic kidney disease, it is important to keep your blood glucose in the target range recommended by your health care provider. This information is not intended to replace advice given to you by your health care provider. Make sure you discuss any questions you have with your health care provider. Document Revised: 10/27/2018 Document Reviewed: 07/01/2016 Elsevier Patient Education  [place].   Chronic Kidney Disease, Adult Chronic kidney disease (CKD) happens when the kidneys are damaged over a long period of time. The kidneys are two organs that help  with:  Getting rid of waste and extra fluid from the blood.  Making hormones that maintain the amount of fluid in your tissues and blood vessels.  Making sure that the body has the right amount of fluids and chemicals. Most of the time, CKD does not go away, but it can usually be controlled. Steps must be taken to slow down the kidney damage or to stop it from getting worse. If this is not done, the kidneys may stop working. Follow these instructions at home: Medicines  Take over-the-counter and prescription medicines only as told by your doctor. You may need to change the amount of medicines you take.  Do not take any new medicines unless your doctor says it is okay. Many medicines can make your kidney damage worse.  Do not take any vitamin and supplements unless your doctor says it is okay. Many vitamins and supplements can make your kidney damage worse. General instructions  Follow a diet as told by your doctor. You may need to stay away from: ? Alcohol. ? Salty foods. ? Foods that are high in:  Potassium.  Calcium.  Protein.  Do not use any products that contain nicotine or tobacco, such as cigarettes and e-cigarettes. If you need help quitting, ask your doctor.  Keep track of your blood pressure at home. Tell your doctor about any changes.  If you have diabetes, keep track of your blood sugar as told by your doctor.  Try to stay at a healthy weight. If you need help, ask your doctor.  Exercise at least 30 minutes a day, 5 days a week.  Stay up-to-date with your shots (immunizations) as told by your doctor.  Keep all follow-up visits as told by your doctor. This is important. Contact a doctor if:  Your symptoms get worse.  You have new symptoms. Get help right away if:  You have symptoms of end-stage kidney disease. These may include: ? Headaches. ? Numbness in your hands or feet. ? Easy bruising. ? Having hiccups often. ? Chest pain. ? Shortness of  breath. ? Stopping of menstrual periods in women.  You have a fever.  You have very little pee (urine).  You have pain or bleeding when you pee. Summary  Chronic kidney disease (CKD) happens when the kidneys are damaged over a long period of time.  Most of the time, this condition does not go away, but it can usually be controlled. Steps must be taken to slow down the kidney damage or to stop it from getting worse.  Treatment may include a combination of medicines and lifestyle changes. This information is not intended  to replace advice given to you by your health care provider. Make sure you discuss any questions you have with your health care provider. Document Revised: 06/18/2017 Document Reviewed: 08/10/2016 Elsevier Patient Education  2020 [employer].

## 2019-08-30 LAB — COMPREHENSIVE METABOLIC PANEL
ALT: 13 IU/L (ref 0–32)
AST: 21 IU/L (ref 0–40)
Albumin/Globulin Ratio: 1.1 — ABNORMAL LOW (ref 1.2–2.2)
Albumin: 3.5 g/dL — ABNORMAL LOW (ref 3.8–4.8)
Alkaline Phosphatase: 155 IU/L — ABNORMAL HIGH (ref 39–117)
BUN/Creatinine Ratio: 15 (ref 12–28)
BUN: 27 mg/dL (ref 8–27)
Bilirubin Total: 0.4 mg/dL (ref 0.0–1.2)
CO2: 25 mmol/L (ref 20–29)
Calcium: 8.5 mg/dL — ABNORMAL LOW (ref 8.7–10.3)
Chloride: 101 mmol/L (ref 96–106)
Creatinine, Ser: 1.82 mg/dL — ABNORMAL HIGH (ref 0.57–1.00)
GFR calc Af Amer: 33 mL/min/{1.73_m2} — ABNORMAL LOW (ref >59)
GFR calc non Af Amer: 29 mL/min/{1.73_m2} — ABNORMAL LOW (ref >59)
Globulin, Total: 3.2 g/dL (ref 1.5–4.5)
Glucose: 92 mg/dL (ref 65–99)
Potassium: 3.6 mmol/L (ref 3.5–5.2)
Sodium: 144 mmol/L (ref 134–144)
Total Protein: 6.7 g/dL (ref 6.0–8.5)

## 2019-08-30 LAB — CBC WITH DIFFERENTIAL/PLATELET
Basophils Absolute: 0 10*3/uL (ref 0.0–0.2)
Basos: 1 %
EOS (ABSOLUTE): 1 10*3/uL — ABNORMAL HIGH (ref 0.0–0.4)
Eos: 19 %
Hematocrit: 35.4 % (ref 34.0–46.6)
Hemoglobin: 11 g/dL — ABNORMAL LOW (ref 11.1–15.9)
Immature Grans (Abs): 0 10*3/uL (ref 0.0–0.1)
Immature Granulocytes: 0 %
Lymphocytes Absolute: 1 10*3/uL (ref 0.7–3.1)
Lymphs: 19 %
MCH: 22.4 pg — ABNORMAL LOW (ref 26.6–33.0)
MCHC: 31.1 g/dL — ABNORMAL LOW (ref 31.5–35.7)
MCV: 72 fL — ABNORMAL LOW (ref 79–97)
Monocytes Absolute: 0.4 10*3/uL (ref 0.1–0.9)
Monocytes: 8 %
Neutrophils Absolute: 2.7 10*3/uL (ref 1.4–7.0)
Neutrophils: 53 %
Platelets: 195 10*3/uL (ref 150–450)
RBC: 4.91 x10E6/uL (ref 3.77–5.28)
RDW: 16.2 % — ABNORMAL HIGH (ref 11.7–15.4)
WBC: 5.1 10*3/uL (ref 3.4–10.8)

## 2019-08-30 LAB — MICROALBUMIN / CREATININE URINE RATIO
Creatinine, Urine: 41.8 mg/dL
Microalb/Creat Ratio: <7 mg/g creat (ref 0–29)
Microalbumin, Urine: <3 ug/mL

## 2019-08-30 LAB — URIC ACID: Uric Acid: 7.1 mg/dL (ref 3.0–7.2)

## 2019-08-31 DIAGNOSIS — I5032 Chronic diastolic (congestive) heart failure: Secondary | ICD-10-CM | POA: Diagnosis not present

## 2019-08-31 DIAGNOSIS — I130 Hypertensive heart and chronic kidney disease with heart failure and stage 1 through stage 4 chronic kidney disease, or unspecified chronic kidney disease: Secondary | ICD-10-CM | POA: Diagnosis not present

## 2019-08-31 DIAGNOSIS — R5381 Other malaise: Secondary | ICD-10-CM | POA: Diagnosis not present

## 2019-08-31 DIAGNOSIS — N1831 Chronic kidney disease, stage 3a: Secondary | ICD-10-CM | POA: Diagnosis not present

## 2019-08-31 DIAGNOSIS — N179 Acute kidney failure, unspecified: Secondary | ICD-10-CM | POA: Diagnosis not present

## 2019-08-31 DIAGNOSIS — D631 Anemia in chronic kidney disease: Secondary | ICD-10-CM | POA: Diagnosis not present

## 2019-09-01 ENCOUNTER — Telehealth: Payer: Self-pay

## 2019-09-01 NOTE — Telephone Encounter (Signed)
-----   Message from Dorena Dew, [place] sent at 09/01/2019  2:14 PM EST ----- Regarding: lab results Please inform patient that creatinine continues to be elevated and is slightly above baseline.  We will continue to monitor closely, return in 1 month for lab appointment.  Given information on renal diet, highly recommended.Avoid all nephrotoxic medications such as ibuprofen, naproxen, also over-the-counter supplements.Mickel Baas, please call nephrology to schedule first available appointment.   [name]  APRN, MSN, FNP-C [facility] [address] [place], [place] [address] [phone]

## 2019-09-01 NOTE — Telephone Encounter (Signed)
Called, no answer. Voicemail did not pick up. Will try later.

## 2019-09-03 NOTE — Progress Notes (Signed)
opened in error

## 2019-09-04 ENCOUNTER — Other Ambulatory Visit: Payer: Self-pay

## 2019-09-04 NOTE — Patient Outreach (Signed)
[place] [facility]) Care Management  09/04/2019  Pamela [name] 05-Apr-1951 [id number]   [facility] case closure.    Ronn Melena, BSW Social Worker [phone]

## 2019-09-04 NOTE — Telephone Encounter (Signed)
Called and spoke with patient, advsied that creatine is elevated above baseline, advised that we will need to recheck in 1 month. Asked that she avoid Nephrotoxic medications such as ibuprofen, naproxen, and over the counter supplements. I advised that she needs to follow up with [facility] and I would call to make an appointment and let her know.   [facility] to schedule spoke with Jeani Hawking, She said Dr. Azzie Roup would need to see notes and Labs before appointment could be made. She asked me to fax over results and assured me that their office will reach out and schedule pt once reviewed.   I have faxed Labs and last office note today 09/04/2019, received a confirmation for 10:04am. I have called and made Ms. Holleran aware that [name] will reach out to schedule appointment once they review notes and labs. Patient verbalized understanding.

## 2019-09-05 ENCOUNTER — Ambulatory Visit: Payer: Medicare Other | Admitting: Family Medicine

## 2019-09-05 DIAGNOSIS — I5032 Chronic diastolic (congestive) heart failure: Secondary | ICD-10-CM | POA: Diagnosis not present

## 2019-09-05 DIAGNOSIS — R5381 Other malaise: Secondary | ICD-10-CM | POA: Diagnosis not present

## 2019-09-05 DIAGNOSIS — N179 Acute kidney failure, unspecified: Secondary | ICD-10-CM | POA: Diagnosis not present

## 2019-09-05 DIAGNOSIS — N1831 Chronic kidney disease, stage 3a: Secondary | ICD-10-CM | POA: Diagnosis not present

## 2019-09-05 DIAGNOSIS — D631 Anemia in chronic kidney disease: Secondary | ICD-10-CM | POA: Diagnosis not present

## 2019-09-05 DIAGNOSIS — I130 Hypertensive heart and chronic kidney disease with heart failure and stage 1 through stage 4 chronic kidney disease, or unspecified chronic kidney disease: Secondary | ICD-10-CM | POA: Diagnosis not present

## 2019-09-06 DIAGNOSIS — D631 Anemia in chronic kidney disease: Secondary | ICD-10-CM | POA: Diagnosis not present

## 2019-09-06 DIAGNOSIS — R5381 Other malaise: Secondary | ICD-10-CM | POA: Diagnosis not present

## 2019-09-06 DIAGNOSIS — I5032 Chronic diastolic (congestive) heart failure: Secondary | ICD-10-CM | POA: Diagnosis not present

## 2019-09-06 DIAGNOSIS — I130 Hypertensive heart and chronic kidney disease with heart failure and stage 1 through stage 4 chronic kidney disease, or unspecified chronic kidney disease: Secondary | ICD-10-CM | POA: Diagnosis not present

## 2019-09-06 DIAGNOSIS — N1831 Chronic kidney disease, stage 3a: Secondary | ICD-10-CM | POA: Diagnosis not present

## 2019-09-06 DIAGNOSIS — N179 Acute kidney failure, unspecified: Secondary | ICD-10-CM | POA: Diagnosis not present

## 2019-09-07 ENCOUNTER — Ambulatory Visit (INDEPENDENT_AMBULATORY_CARE_PROVIDER_SITE_OTHER): Payer: Medicare Other | Admitting: *Deleted

## 2019-09-07 DIAGNOSIS — I639 Cerebral infarction, unspecified: Secondary | ICD-10-CM

## 2019-09-07 LAB — CUP PACEART REMOTE DEVICE CHECK
Date Time Interrogation Session: 20210217235021
Implantable Pulse Generator Implant Date: 20200220

## 2019-09-07 NOTE — Progress Notes (Signed)
ILR Remote 

## 2019-09-09 DIAGNOSIS — I5032 Chronic diastolic (congestive) heart failure: Secondary | ICD-10-CM | POA: Diagnosis not present

## 2019-09-09 DIAGNOSIS — N179 Acute kidney failure, unspecified: Secondary | ICD-10-CM | POA: Diagnosis not present

## 2019-09-09 DIAGNOSIS — I130 Hypertensive heart and chronic kidney disease with heart failure and stage 1 through stage 4 chronic kidney disease, or unspecified chronic kidney disease: Secondary | ICD-10-CM | POA: Diagnosis not present

## 2019-09-09 DIAGNOSIS — D631 Anemia in chronic kidney disease: Secondary | ICD-10-CM | POA: Diagnosis not present

## 2019-09-09 DIAGNOSIS — N1831 Chronic kidney disease, stage 3a: Secondary | ICD-10-CM | POA: Diagnosis not present

## 2019-09-09 DIAGNOSIS — R5381 Other malaise: Secondary | ICD-10-CM | POA: Diagnosis not present

## 2019-09-11 DIAGNOSIS — D631 Anemia in chronic kidney disease: Secondary | ICD-10-CM | POA: Diagnosis not present

## 2019-09-11 DIAGNOSIS — R5381 Other malaise: Secondary | ICD-10-CM | POA: Diagnosis not present

## 2019-09-11 DIAGNOSIS — N1831 Chronic kidney disease, stage 3a: Secondary | ICD-10-CM | POA: Diagnosis not present

## 2019-09-11 DIAGNOSIS — N179 Acute kidney failure, unspecified: Secondary | ICD-10-CM | POA: Diagnosis not present

## 2019-09-11 DIAGNOSIS — I5032 Chronic diastolic (congestive) heart failure: Secondary | ICD-10-CM | POA: Diagnosis not present

## 2019-09-11 DIAGNOSIS — I130 Hypertensive heart and chronic kidney disease with heart failure and stage 1 through stage 4 chronic kidney disease, or unspecified chronic kidney disease: Secondary | ICD-10-CM | POA: Diagnosis not present

## 2019-09-12 ENCOUNTER — Telehealth: Payer: Self-pay | Admitting: Nurse Practitioner

## 2019-09-12 DIAGNOSIS — J4530 Mild persistent asthma, uncomplicated: Secondary | ICD-10-CM

## 2019-09-12 MED ORDER — MONTELUKAST SODIUM 10 MG PO TABS: 10.0000 mg | ORAL_TABLET | Freq: Every day | ORAL | 1 refills | Status: DC

## 2019-09-12 NOTE — Telephone Encounter (Signed)
Refill sent to pharmacy. Thanks.

## 2019-09-13 DIAGNOSIS — R5381 Other malaise: Secondary | ICD-10-CM | POA: Diagnosis not present

## 2019-09-13 DIAGNOSIS — D631 Anemia in chronic kidney disease: Secondary | ICD-10-CM | POA: Diagnosis not present

## 2019-09-13 DIAGNOSIS — N1831 Chronic kidney disease, stage 3a: Secondary | ICD-10-CM | POA: Diagnosis not present

## 2019-09-13 DIAGNOSIS — N179 Acute kidney failure, unspecified: Secondary | ICD-10-CM | POA: Diagnosis not present

## 2019-09-13 DIAGNOSIS — I130 Hypertensive heart and chronic kidney disease with heart failure and stage 1 through stage 4 chronic kidney disease, or unspecified chronic kidney disease: Secondary | ICD-10-CM | POA: Diagnosis not present

## 2019-09-13 DIAGNOSIS — I5032 Chronic diastolic (congestive) heart failure: Secondary | ICD-10-CM | POA: Diagnosis not present

## 2019-09-14 ENCOUNTER — Ambulatory Visit (INDEPENDENT_AMBULATORY_CARE_PROVIDER_SITE_OTHER): Payer: Medicare Other | Admitting: Podiatry

## 2019-09-14 ENCOUNTER — Other Ambulatory Visit: Payer: Self-pay

## 2019-09-14 DIAGNOSIS — B351 Tinea unguium: Secondary | ICD-10-CM | POA: Diagnosis not present

## 2019-09-14 DIAGNOSIS — D631 Anemia in chronic kidney disease: Secondary | ICD-10-CM | POA: Diagnosis not present

## 2019-09-14 DIAGNOSIS — M79674 Pain in right toe(s): Secondary | ICD-10-CM | POA: Diagnosis not present

## 2019-09-14 DIAGNOSIS — I130 Hypertensive heart and chronic kidney disease with heart failure and stage 1 through stage 4 chronic kidney disease, or unspecified chronic kidney disease: Secondary | ICD-10-CM | POA: Diagnosis not present

## 2019-09-14 DIAGNOSIS — R5381 Other malaise: Secondary | ICD-10-CM | POA: Diagnosis not present

## 2019-09-14 DIAGNOSIS — M79675 Pain in left toe(s): Secondary | ICD-10-CM | POA: Diagnosis not present

## 2019-09-14 DIAGNOSIS — I5032 Chronic diastolic (congestive) heart failure: Secondary | ICD-10-CM | POA: Diagnosis not present

## 2019-09-14 DIAGNOSIS — N1831 Chronic kidney disease, stage 3a: Secondary | ICD-10-CM | POA: Diagnosis not present

## 2019-09-14 DIAGNOSIS — N179 Acute kidney failure, unspecified: Secondary | ICD-10-CM | POA: Diagnosis not present

## 2019-09-15 NOTE — Progress Notes (Signed)
Subjective: 69 y.o. returns the office today for painful, elongated, thickened toenails which she cannot trim herself.  She denies any redness or drainage from the toenail sites.  She was having some plantar fasciitis last appointment injections were very helpful.  She occasionally gets some discomfort but there is no pain today.  No recent injury or changes otherwise since I last saw her. Denies any systemic complaints such as fevers, chills, nausea, vomiting.   Objective: AAO 3, NAD DP 2/4, PT 1/4; chronic ankle swelling present bilaterally no evidence of venous insufficiency but there is no ulcerations or drainage. Nails hypertrophic, dystrophic, elongated, brittle, discolored 10. There is tenderness overlying the nails 1-5 bilaterally. There is no surrounding erythema or drainage along the nail sites. No significant tenderness along the course or insertion of plantar fascia.  No other areas of tenderness identified at this time.   No pain with calf compression, warmth, erythema.    Assessment: Patient presents with symptomatic onychomycosis; plantar fasciitis  Plan: -Treatment options including alternatives, risks, complications were discussed -Nails sharply debrided 10 without complication/bleeding. -The heel pain is much improved and she has no other areas of pain today. Continue with supportive shoes and general stretching/rehab exercises. Encouraged elevation and limiting salt intake to help with the swelling.   Return in about 3 months (around 12/12/2019).  Trula Slade DPM Celesta Gentile, DPM

## 2019-09-19 DIAGNOSIS — N179 Acute kidney failure, unspecified: Secondary | ICD-10-CM | POA: Diagnosis not present

## 2019-09-19 DIAGNOSIS — R5381 Other malaise: Secondary | ICD-10-CM | POA: Diagnosis not present

## 2019-09-19 DIAGNOSIS — I130 Hypertensive heart and chronic kidney disease with heart failure and stage 1 through stage 4 chronic kidney disease, or unspecified chronic kidney disease: Secondary | ICD-10-CM | POA: Diagnosis not present

## 2019-09-19 DIAGNOSIS — D631 Anemia in chronic kidney disease: Secondary | ICD-10-CM | POA: Diagnosis not present

## 2019-09-19 DIAGNOSIS — N1831 Chronic kidney disease, stage 3a: Secondary | ICD-10-CM | POA: Diagnosis not present

## 2019-09-19 DIAGNOSIS — I5032 Chronic diastolic (congestive) heart failure: Secondary | ICD-10-CM | POA: Diagnosis not present

## 2019-09-21 DIAGNOSIS — N1831 Chronic kidney disease, stage 3a: Secondary | ICD-10-CM | POA: Diagnosis not present

## 2019-09-21 DIAGNOSIS — I5032 Chronic diastolic (congestive) heart failure: Secondary | ICD-10-CM | POA: Diagnosis not present

## 2019-09-21 DIAGNOSIS — R5381 Other malaise: Secondary | ICD-10-CM | POA: Diagnosis not present

## 2019-09-21 DIAGNOSIS — I130 Hypertensive heart and chronic kidney disease with heart failure and stage 1 through stage 4 chronic kidney disease, or unspecified chronic kidney disease: Secondary | ICD-10-CM | POA: Diagnosis not present

## 2019-09-21 DIAGNOSIS — N179 Acute kidney failure, unspecified: Secondary | ICD-10-CM | POA: Diagnosis not present

## 2019-09-21 DIAGNOSIS — D631 Anemia in chronic kidney disease: Secondary | ICD-10-CM | POA: Diagnosis not present

## 2019-09-22 DIAGNOSIS — E876 Hypokalemia: Secondary | ICD-10-CM | POA: Diagnosis not present

## 2019-09-22 DIAGNOSIS — D631 Anemia in chronic kidney disease: Secondary | ICD-10-CM | POA: Diagnosis not present

## 2019-09-22 DIAGNOSIS — Z9181 History of falling: Secondary | ICD-10-CM | POA: Diagnosis not present

## 2019-09-22 DIAGNOSIS — I130 Hypertensive heart and chronic kidney disease with heart failure and stage 1 through stage 4 chronic kidney disease, or unspecified chronic kidney disease: Secondary | ICD-10-CM | POA: Diagnosis not present

## 2019-09-22 DIAGNOSIS — Z8673 Personal history of transient ischemic attack (TIA), and cerebral infarction without residual deficits: Secondary | ICD-10-CM | POA: Diagnosis not present

## 2019-09-22 DIAGNOSIS — Z6841 Body Mass Index (BMI) 40.0 and over, adult: Secondary | ICD-10-CM | POA: Diagnosis not present

## 2019-09-22 DIAGNOSIS — J9612 Chronic respiratory failure with hypercapnia: Secondary | ICD-10-CM | POA: Diagnosis not present

## 2019-09-22 DIAGNOSIS — R5381 Other malaise: Secondary | ICD-10-CM | POA: Diagnosis not present

## 2019-09-22 DIAGNOSIS — Z8616 Personal history of COVID-19: Secondary | ICD-10-CM | POA: Diagnosis not present

## 2019-09-22 DIAGNOSIS — N1831 Chronic kidney disease, stage 3a: Secondary | ICD-10-CM | POA: Diagnosis not present

## 2019-09-22 DIAGNOSIS — Z7982 Long term (current) use of aspirin: Secondary | ICD-10-CM | POA: Diagnosis not present

## 2019-09-22 DIAGNOSIS — I5032 Chronic diastolic (congestive) heart failure: Secondary | ICD-10-CM | POA: Diagnosis not present

## 2019-09-22 DIAGNOSIS — N179 Acute kidney failure, unspecified: Secondary | ICD-10-CM | POA: Diagnosis not present

## 2019-09-22 DIAGNOSIS — Z7951 Long term (current) use of inhaled steroids: Secondary | ICD-10-CM | POA: Diagnosis not present

## 2019-09-22 DIAGNOSIS — J9611 Chronic respiratory failure with hypoxia: Secondary | ICD-10-CM | POA: Diagnosis not present

## 2019-09-22 DIAGNOSIS — Z87440 Personal history of urinary (tract) infections: Secondary | ICD-10-CM | POA: Diagnosis not present

## 2019-09-22 DIAGNOSIS — I890 Lymphedema, not elsewhere classified: Secondary | ICD-10-CM | POA: Diagnosis not present

## 2019-09-22 DIAGNOSIS — J45909 Unspecified asthma, uncomplicated: Secondary | ICD-10-CM | POA: Diagnosis not present

## 2019-09-26 ENCOUNTER — Other Ambulatory Visit: Payer: Self-pay

## 2019-09-26 ENCOUNTER — Other Ambulatory Visit: Payer: Medicare Other

## 2019-09-26 DIAGNOSIS — N183 Chronic kidney disease, stage 3 unspecified: Secondary | ICD-10-CM | POA: Diagnosis not present

## 2019-09-27 DIAGNOSIS — R5381 Other malaise: Secondary | ICD-10-CM | POA: Diagnosis not present

## 2019-09-27 DIAGNOSIS — N1831 Chronic kidney disease, stage 3a: Secondary | ICD-10-CM | POA: Diagnosis not present

## 2019-09-27 DIAGNOSIS — N179 Acute kidney failure, unspecified: Secondary | ICD-10-CM | POA: Diagnosis not present

## 2019-09-27 DIAGNOSIS — D631 Anemia in chronic kidney disease: Secondary | ICD-10-CM | POA: Diagnosis not present

## 2019-09-27 DIAGNOSIS — I130 Hypertensive heart and chronic kidney disease with heart failure and stage 1 through stage 4 chronic kidney disease, or unspecified chronic kidney disease: Secondary | ICD-10-CM | POA: Diagnosis not present

## 2019-09-27 DIAGNOSIS — I5032 Chronic diastolic (congestive) heart failure: Secondary | ICD-10-CM | POA: Diagnosis not present

## 2019-09-27 LAB — COMPREHENSIVE METABOLIC PANEL
ALT: 15 IU/L (ref 0–32)
AST: 28 IU/L (ref 0–40)
Albumin/Globulin Ratio: 1.1 — ABNORMAL LOW (ref 1.2–2.2)
Albumin: 3.6 g/dL — ABNORMAL LOW (ref 3.8–4.8)
Alkaline Phosphatase: 186 IU/L — ABNORMAL HIGH (ref 39–117)
BUN/Creatinine Ratio: 25 (ref 12–28)
BUN: 47 mg/dL — ABNORMAL HIGH (ref 8–27)
Bilirubin Total: 0.4 mg/dL (ref 0.0–1.2)
CO2: 25 mmol/L (ref 20–29)
Calcium: 9.2 mg/dL (ref 8.7–10.3)
Chloride: 97 mmol/L (ref 96–106)
Creatinine, Ser: 1.86 mg/dL — ABNORMAL HIGH (ref 0.57–1.00)
GFR calc Af Amer: 32 mL/min/{1.73_m2} — ABNORMAL LOW (ref >59)
GFR calc non Af Amer: 28 mL/min/{1.73_m2} — ABNORMAL LOW (ref >59)
Globulin, Total: 3.2 g/dL (ref 1.5–4.5)
Glucose: 115 mg/dL — ABNORMAL HIGH (ref 65–99)
Potassium: 3.5 mmol/L (ref 3.5–5.2)
Sodium: 143 mmol/L (ref 134–144)
Total Protein: 6.8 g/dL (ref 6.0–8.5)

## 2019-09-29 DIAGNOSIS — D631 Anemia in chronic kidney disease: Secondary | ICD-10-CM | POA: Diagnosis not present

## 2019-09-29 DIAGNOSIS — I130 Hypertensive heart and chronic kidney disease with heart failure and stage 1 through stage 4 chronic kidney disease, or unspecified chronic kidney disease: Secondary | ICD-10-CM | POA: Diagnosis not present

## 2019-09-29 DIAGNOSIS — R5381 Other malaise: Secondary | ICD-10-CM | POA: Diagnosis not present

## 2019-09-29 DIAGNOSIS — I5032 Chronic diastolic (congestive) heart failure: Secondary | ICD-10-CM | POA: Diagnosis not present

## 2019-09-29 DIAGNOSIS — N179 Acute kidney failure, unspecified: Secondary | ICD-10-CM | POA: Diagnosis not present

## 2019-09-29 DIAGNOSIS — N1831 Chronic kidney disease, stage 3a: Secondary | ICD-10-CM | POA: Diagnosis not present

## 2019-10-04 DIAGNOSIS — I130 Hypertensive heart and chronic kidney disease with heart failure and stage 1 through stage 4 chronic kidney disease, or unspecified chronic kidney disease: Secondary | ICD-10-CM | POA: Diagnosis not present

## 2019-10-04 DIAGNOSIS — N1831 Chronic kidney disease, stage 3a: Secondary | ICD-10-CM | POA: Diagnosis not present

## 2019-10-04 DIAGNOSIS — N179 Acute kidney failure, unspecified: Secondary | ICD-10-CM | POA: Diagnosis not present

## 2019-10-04 DIAGNOSIS — R5381 Other malaise: Secondary | ICD-10-CM | POA: Diagnosis not present

## 2019-10-04 DIAGNOSIS — D631 Anemia in chronic kidney disease: Secondary | ICD-10-CM | POA: Diagnosis not present

## 2019-10-04 DIAGNOSIS — I5032 Chronic diastolic (congestive) heart failure: Secondary | ICD-10-CM | POA: Diagnosis not present

## 2019-10-06 DIAGNOSIS — N179 Acute kidney failure, unspecified: Secondary | ICD-10-CM | POA: Diagnosis not present

## 2019-10-06 DIAGNOSIS — N189 Chronic kidney disease, unspecified: Secondary | ICD-10-CM | POA: Diagnosis not present

## 2019-10-06 DIAGNOSIS — I129 Hypertensive chronic kidney disease with stage 1 through stage 4 chronic kidney disease, or unspecified chronic kidney disease: Secondary | ICD-10-CM | POA: Diagnosis not present

## 2019-10-06 DIAGNOSIS — C3490 Malignant neoplasm of unspecified part of unspecified bronchus or lung: Secondary | ICD-10-CM | POA: Diagnosis not present

## 2019-10-06 DIAGNOSIS — N2581 Secondary hyperparathyroidism of renal origin: Secondary | ICD-10-CM | POA: Diagnosis not present

## 2019-10-06 DIAGNOSIS — D573 Sickle-cell trait: Secondary | ICD-10-CM | POA: Diagnosis not present

## 2019-10-06 DIAGNOSIS — D631 Anemia in chronic kidney disease: Secondary | ICD-10-CM | POA: Diagnosis not present

## 2019-10-06 DIAGNOSIS — I639 Cerebral infarction, unspecified: Secondary | ICD-10-CM | POA: Diagnosis not present

## 2019-10-06 DIAGNOSIS — N183 Chronic kidney disease, stage 3 unspecified: Secondary | ICD-10-CM | POA: Diagnosis not present

## 2019-10-06 DIAGNOSIS — D869 Sarcoidosis, unspecified: Secondary | ICD-10-CM | POA: Diagnosis not present

## 2019-10-11 DIAGNOSIS — I5032 Chronic diastolic (congestive) heart failure: Secondary | ICD-10-CM | POA: Diagnosis not present

## 2019-10-11 DIAGNOSIS — N179 Acute kidney failure, unspecified: Secondary | ICD-10-CM | POA: Diagnosis not present

## 2019-10-11 DIAGNOSIS — I130 Hypertensive heart and chronic kidney disease with heart failure and stage 1 through stage 4 chronic kidney disease, or unspecified chronic kidney disease: Secondary | ICD-10-CM | POA: Diagnosis not present

## 2019-10-11 DIAGNOSIS — N1831 Chronic kidney disease, stage 3a: Secondary | ICD-10-CM | POA: Diagnosis not present

## 2019-10-11 DIAGNOSIS — R5381 Other malaise: Secondary | ICD-10-CM | POA: Diagnosis not present

## 2019-10-11 DIAGNOSIS — D631 Anemia in chronic kidney disease: Secondary | ICD-10-CM | POA: Diagnosis not present

## 2019-10-14 ENCOUNTER — Ambulatory Visit: Payer: Medicare Other | Attending: Internal Medicine

## 2019-10-14 DIAGNOSIS — Z23 Encounter for immunization: Secondary | ICD-10-CM

## 2019-10-14 NOTE — Progress Notes (Signed)
   Covid-19 Vaccination Clinic  Name:  Pamela [name]    MRN: [id number] DOB: [birth year]  10/14/2019  [name] was observed post Covid-19 immunization for 15 minutes without incident. She was provided with Vaccine Information Sheet and instruction to access the V-Safe system.   [name] was instructed to call 911 with any severe reactions post vaccine: [name] Kitchen Difficulty breathing  . Swelling of face and throat  . A fast heartbeat  . A bad rash all over body  . Dizziness and weakness   Immunizations Administered    Name Date Dose VIS Date Route   Pfizer COVID-19 Vaccine 10/14/2019  3:04 PM 0.3 mL 06/30/2019 Intramuscular   Manufacturer: [place]   Lot: [id number]   [place]: [address]-7

## 2019-10-24 ENCOUNTER — Ambulatory Visit (INDEPENDENT_AMBULATORY_CARE_PROVIDER_SITE_OTHER): Payer: Medicare Other | Admitting: Family Medicine

## 2019-10-24 ENCOUNTER — Encounter: Payer: Self-pay | Admitting: Family Medicine

## 2019-10-24 ENCOUNTER — Other Ambulatory Visit: Payer: Self-pay

## 2019-10-24 VITALS — BP 102/54 | HR 65 | Temp 98.2°F | Resp 16 | Ht 66.0 in | Wt 332.0 lb

## 2019-10-24 DIAGNOSIS — I10 Essential (primary) hypertension: Secondary | ICD-10-CM | POA: Diagnosis not present

## 2019-10-24 DIAGNOSIS — N183 Chronic kidney disease, stage 3 unspecified: Secondary | ICD-10-CM | POA: Diagnosis not present

## 2019-10-24 DIAGNOSIS — G8929 Other chronic pain: Secondary | ICD-10-CM

## 2019-10-24 DIAGNOSIS — M25511 Pain in right shoulder: Secondary | ICD-10-CM | POA: Diagnosis not present

## 2019-10-24 DIAGNOSIS — D869 Sarcoidosis, unspecified: Secondary | ICD-10-CM

## 2019-10-24 DIAGNOSIS — R7303 Prediabetes: Secondary | ICD-10-CM | POA: Diagnosis not present

## 2019-10-24 DIAGNOSIS — Z6841 Body Mass Index (BMI) 40.0 and over, adult: Secondary | ICD-10-CM

## 2019-10-24 DIAGNOSIS — J4530 Mild persistent asthma, uncomplicated: Secondary | ICD-10-CM | POA: Diagnosis not present

## 2019-10-24 DIAGNOSIS — I639 Cerebral infarction, unspecified: Secondary | ICD-10-CM

## 2019-10-24 LAB — POCT URINALYSIS DIPSTICK
Bilirubin, UA: NEGATIVE
Blood, UA: NEGATIVE
Glucose, UA: NEGATIVE
Ketones, UA: NEGATIVE
Leukocytes, UA: NEGATIVE
Nitrite, UA: NEGATIVE
Protein, UA: NEGATIVE
Spec Grav, UA: 1.015 (ref 1.010–1.025)
Urobilinogen, UA: 0.2 E.U./dL
pH, UA: 5 (ref 5.0–8.0)

## 2019-10-24 MED ORDER — BUDESONIDE-FORMOTEROL FUMARATE 160-4.5 MCG/ACT IN AERO: 2.0000 | INHALATION_SPRAY | Freq: Two times a day (BID) | RESPIRATORY_TRACT | 5 refills | Status: DC

## 2019-10-24 NOTE — Progress Notes (Signed)
Patient [facility]    Established Patient Office Visit  Subjective:  Patient ID: Pamela [name], female    DOB: [birth year]  Age: 69 y.o. MRN: [id number]  CC:  Chief Complaint  Patient presents with  . Hypertension  . Cough    dry cough x 2 weeks   . Medication Refill    symbicort     HPI Pamela [name], a very pleasant 69 year old female with a medical history significant for right upper lobe adenocarcinoma, history of mild intermittent asthma, gouty arthritis, type 2 diabetes mellitus, hypertension, COPD, congenital single kidney, stage III chronic kidney disease, and congestive heart failure presents for follow-up of chronic condition.  Patient has had worsening renal insufficiency over the past several months.  She has been followed closely by nephrology.  Patient states that last nephrologist appointment was several weeks ago and she was told that things were improving.  She has been taking all of her medications consistently without interruption.  Patient has also been controlling her portions and remaining active. Hypertension This is a chronic problem. The problem is controlled. Associated symptoms include peripheral edema. Pertinent negatives include no malaise/fatigue, orthopnea or palpitations. Risk factors for coronary artery disease include obesity. Past treatments include nothing. The current treatment provides no improvement. Hypertensive end-organ damage includes CVA and heart failure. There is no history of kidney disease or CAD/MI.  Diabetes Pertinent negatives for hypoglycemia include no dizziness. Pertinent negatives for diabetes include no polydipsia, no polyphagia and no polyuria. Diabetic complications include a CVA. When asked about current treatments, none were reported.     Past Medical History:  Diagnosis Date  . Arthritis   . Asthma   . Cancer ([place])    lung, adenocarcinoma right lung 2012  . CHF (congestive heart  failure) (H[facility])    Preserved EF  . Congenital single kidney    With chronic kidney disease  . COPD (chronic obstructive pulmonary disease) ([place])   . Gout   . Hypertension   . Lung cancer [facility]) 2012   Right upper lobe lung adenocarcinoma diagnosed with needle biopsy treated by SBRT finished treatment April 2013 has been monitored since  . Mass of chest wall, right    Right chest wall mass 7.3 cm biopsy on 12/13/2013. Patient notes it was consistent with sarcoidosis but actual pathology results not available.  . Oxygen deficiency   . Sarcoidosis   . Sickle cell trait ([place])   . Sleep apnea     Past Surgical History:  Procedure Laterality Date  . LOOP RECORDER INSERTION N/A 09/08/2018   Procedure: LOOP RECORDER INSERTION;  Surgeon: Evans Lance, MD;  Location: [facility];  Service: Cardiovascular;  Laterality: N/A;  . LUNG BIOPSY    . TEE WITHOUT CARDIOVERSION N/A 09/08/2018   Procedure: TRANSESOPHAGEAL ECHOCARDIOGRAM (TEE);  Surgeon: Josue Hector, MD;  Location: [facility] ENDOSCOPY;  Service: Cardiovascular;  Laterality: N/A;  with loop  . TUBAL LIGATION    . VEIN LIGATION AND STRIPPING      Family History  Problem Relation Age of Onset  . Hypertension Mother   . Renal Disease Mother   . Cancer Father        stomach  . Heart disease Father        No details  . Cancer Brother   . Diabetes Brother   . Cervical cancer Sister   . Diabetes Sister   . Multiple myeloma Sister  Social History   Socioeconomic History  . Marital status: Divorced    Spouse name: Not on file  . Number of children: 2  . Years of education: Not on file  . Highest education level: Not on file  Occupational History  . Not on file  Tobacco Use  . Smoking status: Never Smoker  . Smokeless tobacco: Never Used  Substance and Sexual Activity  . Alcohol use: No    Alcohol/week: 0.0 standard drinks  . Drug use: No  . Sexual activity: Not Currently  Other Topics Concern  . Not on file   Social History Narrative   Lives with son.  One son is deceased.     Social Determinants of Health   Financial Resource Strain:   . Difficulty of Paying Living Expenses:   Food Insecurity:   . Worried About Charity fundraiser in the Last Year:   . Arboriculturist in the Last Year:   Transportation Needs:   . Film/video editor (Medical):   [name] Kitchen Lack of Transportation (Non-Medical):   Physical Activity:   . Days of Exercise per Week:   . Minutes of Exercise per Session:   Stress:   . Feeling of Stress :   Social Connections:   . Frequency of Communication with Friends and Family:   . Frequency of Social Gatherings with Friends and Family:   . Attends Religious Services:   . Active Member of Clubs or Organizations:   . Attends Archivist Meetings:   [name] Kitchen Marital Status:   Intimate Partner Violence:   . Fear of Current or Ex-Partner:   . Emotionally Abused:   [name] Kitchen Physically Abused:   . Sexually Abused:     Outpatient Medications Prior to Visit  Medication Sig Dispense Refill  . acetaminophen (TYLENOL) 500 MG tablet Take 1,000 mg by mouth every 6 (six) hours as needed for mild pain.     [name] Kitchen albuterol (PROAIR HFA) 108 (90 Base) MCG/ACT inhaler Inhale 2 puffs into the lungs every 6 (six) hours as needed for wheezing or shortness of breath. 3 Inhaler 1  . albuterol (PROVENTIL) (2.5 MG/3ML) 0.083% nebulizer solution Take 3 mLs (2.5 mg total) by nebulization every 6 (six) hours as needed for wheezing or shortness of breath. 100 mL 0  . aspirin 325 MG tablet Take 1 tablet (325 mg total) by mouth daily. 30 tablet 0  . atorvastatin (LIPITOR) 40 MG tablet Take 1 tablet (40 mg total) by mouth daily at 6 PM. 90 tablet 1  . budesonide-formoterol (SYMBICORT) 160-4.5 MCG/ACT inhaler Inhale 2 puffs into the lungs 2 (two) times daily. 18 g 5  . calcitRIOL (ROCALTROL) 0.25 MCG capsule Take 1 capsule (0.25 mcg total) by mouth daily. 30 capsule 0  . calcium carbonate (TUMS - DOSED IN MG  ELEMENTAL CALCIUM) 500 MG chewable tablet Chew 1-2 tablets by mouth daily as needed for indigestion (gas).     [name] Kitchen dextromethorphan (DELSYM) 30 MG/5ML liquid Take by mouth as needed for cough.    . docusate sodium (COLACE) 100 MG capsule Take 1 capsule (100 mg total) by mouth 2 (two) times daily. 60 capsule 0  . febuxostat (ULORIC) 40 MG tablet Take 1 tablet (40 mg total) by mouth daily. 90 tablet 1  . furosemide (LASIX) 40 MG tablet Take 1 tablet (40 mg total) by mouth 2 (two) times daily. For CHF 180 tablet 1  . montelukast (SINGULAIR) 10 MG tablet Take 1 tablet (10 mg total) by  mouth at bedtime. 90 tablet 1   No facility-administered medications prior to visit.    Allergies  Allergen Reactions  . Sulfa Antibiotics Hives and Rash    ROS Review of Systems  Constitutional: Negative for malaise/fatigue.  HENT: Negative.   Eyes: Negative.   Cardiovascular: Negative.  Negative for palpitations and orthopnea.  Gastrointestinal: Negative.   Endocrine: Negative for polydipsia, polyphagia and polyuria.  Genitourinary: Negative.   Musculoskeletal: Negative.   Skin: Negative.   Neurological: Negative.  Negative for dizziness and facial asymmetry.  Psychiatric/Behavioral: Negative.      Objective:    Physical Exam  Constitutional: She is oriented to person, place, and time. She appears well-developed and well-nourished.  Cardiovascular: Normal rate and regular rhythm.  Pulmonary/Chest: Effort normal and breath sounds normal.  Abdominal: Soft. Bowel sounds are normal.  Musculoskeletal:        General: Normal range of motion.  Neurological: She is alert and oriented to person, place, and time.  Skin: Skin is warm and dry.  Psychiatric: She has a normal mood and affect. Her behavior is normal. Judgment and thought content normal.    BP (!) 102/54 (BP Location: Right Arm, Patient Position: Sitting, Cuff Size: Large)   Pulse 65   Temp 98.2 F (36.8 C) (Oral)   Resp 16   Ht 5' 6"  (1.676 m)   Wt (!) 332 lb (150.6 kg)   SpO2 97%   BMI 53.59 kg/m  Wt Readings from Last 3 Encounters:  10/24/19 (!) 332 lb (150.6 kg)  08/29/19 (!) 346 lb (156.9 kg)  07/28/19 (!) 343 lb 4.1 oz (155.7 kg)     Health Maintenance Due  Topic Date Due  . PNA vac Low Risk Adult (2 of 2 - PPSV23) 04/14/2019    There are no preventive care reminders to display for this patient.  Lab Results  Component Value Date   TSH 3.934 05/21/2014   Lab Results  Component Value Date   WBC 5.1 08/29/2019   HGB 11.0 (L) 08/29/2019   HCT 35.4 08/29/2019   MCV 72 (L) 08/29/2019   PLT 195 08/29/2019   Lab Results  Component Value Date   NA 143 09/26/2019   K 3.5 09/26/2019   CHLORIDE 103 05/31/2017   CO2 25 09/26/2019   GLUCOSE 115 (H) 09/26/2019   BUN 47 (H) 09/26/2019   CREATININE 1.86 (H) 09/26/2019   BILITOT 0.4 09/26/2019   ALKPHOS 186 (H) 09/26/2019   AST 28 09/26/2019   ALT 15 09/26/2019   PROT 6.8 09/26/2019   ALBUMIN 3.6 (L) 09/26/2019   CALCIUM 9.2 09/26/2019   ANIONGAP 12 08/02/2019   EGFR 38 (L) 05/31/2017   Lab Results  Component Value Date   CHOL 130 03/01/2019   Lab Results  Component Value Date   HDL 48 03/01/2019   Lab Results  Component Value Date   LDLCALC 61 03/01/2019   Lab Results  Component Value Date   TRIG 105 03/01/2019   Lab Results  Component Value Date   CHOLHDL 2.7 03/01/2019   Lab Results  Component Value Date   HGBA1C 6.1 (H) 03/01/2019      Assessment & Plan:   Problem List Items Addressed This Visit      Cardiovascular and Mediastinum   Essential hypertension - Primary   Relevant Orders   Basic Metabolic Panel   Urinalysis Dipstick     Other   Prediabetes   Relevant Orders   HgB A1c  Other Visit Diagnoses    Stage 3 chronic kidney disease, unspecified whether stage 3a or 3b CKD       Relevant Orders   Basic Metabolic Panel     Essential hypertension BP (!) 102/54 (BP Location: Right Arm, Patient Position:  Sitting, Cuff Size: Large)   Pulse 65   Temp 98.2 F (36.8 C) (Oral)   Resp 16   Ht 5' 6" (1.676 m)   Wt (!) 332 lb (150.6 kg)   SpO2 97%   BMI 53.59 kg/m  Continue medication, monitor blood pressure at home. Continue DASH diet. Reminder to go to the ER if any CP, SOB, nausea, dizziness, severe HA, changes vision/speech, left arm numbness and tingling and jaw pain.    - Basic Metabolic Panel - Urinalysis Dipstick  Stage 3 chronic kidney disease, unspecified whether stage 3a or 3b CKD Follow up with nephrology as scheduled.  Review creatinine as results become available - Basic Metabolic Panel  Prediabetes Previous hemoglobin a1C is 6.1, recommend a carbohydrate modified diet.   - HgB A1c   Class 3 severe obesity due to excess calories with serious comorbidity and body mass index (BMI) of 50.0 to 59.9 in adult [facility]) Patient has decreased weight by 14 pounds since previous visit. The patient is asked to make an attempt to improve diet and exercise patterns to aid in medical management of this problem.   Sarcoidosis Follow up with pulmonologist as scheduled.   Mild persistent chronic asthma without complication - budesonide-formoterol (SYMBICORT) 160-4.5 MCG/ACT inhaler; Inhale 2 puffs into the lungs 2 (two) times daily.  Dispense: 18 g; Refill: 5  Follow-up: Return in about 3 months (around 01/23/2020) for hypertension.     [name]  APRN, MSN, FNP-C Patient [place] [address] [place], [place] [address] [phone]

## 2019-10-24 NOTE — Patient Instructions (Signed)
Chronic Kidney Disease, Adult Chronic kidney disease (CKD) happens when the kidneys are damaged over a long period of time. The kidneys are two organs that help with:  Getting rid of waste and extra fluid from the blood.  Making hormones that maintain the amount of fluid in your tissues and blood vessels.  Making sure that the body has the right amount of fluids and chemicals. Most of the time, CKD does not go away, but it can usually be controlled. Steps must be taken to slow down the kidney damage or to stop it from getting worse. If this is not done, the kidneys may stop working. Follow these instructions at home: Medicines  Take over-the-counter and prescription medicines only as told by your doctor. You may need to change the amount of medicines you take.  Do not take any new medicines unless your doctor says it is okay. Many medicines can make your kidney damage worse.  Do not take any vitamin and supplements unless your doctor says it is okay. Many vitamins and supplements can make your kidney damage worse. General instructions  Follow a diet as told by your doctor. You may need to stay away from: ? Alcohol. ? Salty foods. ? Foods that are high in:  Potassium.  Calcium.  Protein.  Do not use any products that contain nicotine or tobacco, such as cigarettes and e-cigarettes. If you need help quitting, ask your doctor.  Keep track of your blood pressure at home. Tell your doctor about any changes.  If you have diabetes, keep track of your blood sugar as told by your doctor.  Try to stay at a healthy weight. If you need help, ask your doctor.  Exercise at least 30 minutes a day, 5 days a week.  Stay up-to-date with your shots (immunizations) as told by your doctor.  Keep all follow-up visits as told by your doctor. This is important. Contact a doctor if:  Your symptoms get worse.  You have new symptoms. Get help right away if:  You have symptoms of end-stage  kidney disease. These may include: ? Headaches. ? Numbness in your hands or feet. ? Easy bruising. ? Having hiccups often. ? Chest pain. ? Shortness of breath. ? Stopping of menstrual periods in women.  You have a fever.  You have very little pee (urine).  You have pain or bleeding when you pee. Summary  Chronic kidney disease (CKD) happens when the kidneys are damaged over a long period of time.  Most of the time, this condition does not go away, but it can usually be controlled. Steps must be taken to slow down the kidney damage or to stop it from getting worse.  Treatment may include a combination of medicines and lifestyle changes. This information is not intended to replace advice given to you by your health care provider. Make sure you discuss any questions you have with your health care provider. Document Revised: 06/18/2017 Document Reviewed: 08/10/2016 Elsevier Patient Education  2020 [employer].

## 2019-10-25 ENCOUNTER — Telehealth: Payer: Self-pay

## 2019-10-25 LAB — BASIC METABOLIC PANEL
BUN/Creatinine Ratio: 25 (ref 12–28)
BUN: 54 mg/dL — ABNORMAL HIGH (ref 8–27)
CO2: 25 mmol/L (ref 20–29)
Calcium: 8.5 mg/dL — ABNORMAL LOW (ref 8.7–10.3)
Chloride: 96 mmol/L (ref 96–106)
Creatinine, Ser: 2.18 mg/dL — ABNORMAL HIGH (ref 0.57–1.00)
GFR calc Af Amer: 26 mL/min/{1.73_m2} — ABNORMAL LOW (ref >59)
GFR calc non Af Amer: 23 mL/min/{1.73_m2} — ABNORMAL LOW (ref >59)
Glucose: 94 mg/dL (ref 65–99)
Potassium: 3.4 mmol/L — ABNORMAL LOW (ref 3.5–5.2)
Sodium: 141 mmol/L (ref 134–144)

## 2019-10-25 NOTE — Telephone Encounter (Signed)
-----   Message from Dorena Dew, [place] sent at 10/25/2019 10:30 AM EDT ----- Regarding: lab results Please inform patient that kidney function has worsened since previous visit. Remind patient of the importance of hydration. At least 2 eight ounce bottles of water. Also, I did a medication review, it appears that all medications are at appropriate doses for current GFR. Please fax results to nephrology and ensure that patient has a follow up appointment scheduled with nephrology. We will continue close surveillance here, schedule a lab appointment in 1 month.   [name]  APRN, MSN, FNP-C Patient [place] [address] [place], [place] [address] [phone]

## 2019-10-25 NOTE — Telephone Encounter (Signed)
Called and spoke with patient. Informed that kidney function has worsened. Advised that she should be drinking at least 2 8oz bottles of water daily and that medications are at appropriate does for her current GFR. I have faxed the labs to Dr. Azzie Roup at [facility]. I called their office and spoke with Felicia. Patient was last seen on 10/06/19 and does not currently have a follow up, they request to review labs and will address when next appointment is appropriate. I have scheduled her a 1 month follow up for repeat bmp and advised her we will continue to monitor.

## 2019-10-26 ENCOUNTER — Telehealth: Payer: Self-pay | Admitting: Nurse Practitioner

## 2019-10-26 ENCOUNTER — Other Ambulatory Visit: Payer: Self-pay

## 2019-10-26 ENCOUNTER — Other Ambulatory Visit: Payer: Self-pay | Admitting: Internal Medicine

## 2019-10-26 ENCOUNTER — Other Ambulatory Visit: Payer: Self-pay | Admitting: Family Medicine

## 2019-10-26 MED ORDER — CALCITRIOL 0.25 MCG PO CAPS: 0.2500 ug | ORAL_CAPSULE | Freq: Every day | ORAL | 2 refills | Status: DC

## 2019-10-26 NOTE — Progress Notes (Signed)
Meds ordered this encounter  Medications  . calcitRIOL (ROCALTROL) 0.25 MCG capsule    Sig: Take 1 capsule (0.25 mcg total) by mouth daily.    Dispense:  30 capsule    Refill:  2    Order Specific Question:   Supervising Provider    Answer:   Tresa Garter [[id number]]    [name]  APRN, MSN, FNP-C Patient [place] [address] [place], [place] [address] [phone]

## 2019-10-27 NOTE — Telephone Encounter (Signed)
Done

## 2019-10-30 ENCOUNTER — Encounter: Payer: Self-pay | Admitting: Orthopedic Surgery

## 2019-10-30 ENCOUNTER — Other Ambulatory Visit: Payer: Self-pay

## 2019-10-30 ENCOUNTER — Ambulatory Visit (INDEPENDENT_AMBULATORY_CARE_PROVIDER_SITE_OTHER): Payer: Medicare Other | Admitting: Orthopedic Surgery

## 2019-10-30 DIAGNOSIS — I639 Cerebral infarction, unspecified: Secondary | ICD-10-CM

## 2019-10-30 DIAGNOSIS — M7541 Impingement syndrome of right shoulder: Secondary | ICD-10-CM | POA: Diagnosis not present

## 2019-10-30 MED ORDER — LIDOCAINE HCL 1 % IJ SOLN
5.0000 mL | INTRAMUSCULAR | Status: AC | PRN
  Administered 2019-10-30: 5 mL

## 2019-10-30 MED ORDER — METHYLPREDNISOLONE ACETATE 40 MG/ML IJ SUSP
40.0000 mg | INTRAMUSCULAR | Status: AC | PRN
  Administered 2019-10-30: 40 mg via INTRA_ARTICULAR

## 2019-10-30 NOTE — Progress Notes (Signed)
Office Visit Note   Patient: Pamela [name]           Date of Birth: [birth year]           MRN: [id number] Visit Date: 10/30/2019              Requested by: Dorena Dew, FNP [address],  [place] [address] PCP: Dorena Dew, FNP  Chief Complaint  Patient presents with  . Right Shoulder - Pain      HPI: Patient is a 69 year old woman who was seen for initial evaluation of right shoulder pain.  Patient states she has had pain for about a year she states she had a stroke last year and fell about a year ago.  She has been using Tylenol for pain.  Patient states the pain comes and goes and hurts when she is using her arm.   Assessment & Plan: Visit Diagnoses:  1. Impingement syndrome of right shoulder     Plan: The right shoulder was injected in the subacromial space she tolerated this well.  Follow-Up Instructions: Return in about 2 weeks (around 11/13/2019).   Ortho Exam  Patient is alert, oriented, no adenopathy, well-dressed, normal affect, normal respiratory effort. Examination of the right shoulder patient has no adhesive capsulitis she has abduction flexion to 90 degrees she has pain reproduced with internal and external rotation of the shoulder.  She has some tenderness palpation of the [facility] joint and tenderness to palpation over the biceps tendon.  Imaging: No results found. No images are attached to the encounter.  Labs: Lab Results  Component Value Date   HGBA1C 6.1 (H) 03/01/2019   HGBA1C 6.0 (H) 09/07/2018   HGBA1C 5.5 09/01/2018   ESRSEDRATE 29 05/26/2016   ESRSEDRATE 38 (H) 05/09/2015   ESRSEDRATE 83 (H) 05/24/2014   CRP 1.2 (H) 01/05/2019   CRP 2.5 (H) 01/04/2019   CRP 4.3 (H) 01/03/2019   LABURIC 7.1 08/29/2019   LABURIC 17.5 (H) 05/24/2014   LABURIC 16.1 (H) 05/21/2014   REPTSTATUS 09/11/2018 FINAL 09/06/2018   CULT NO GROWTH 5 DAYS 09/06/2018     Lab Results  Component Value Date   ALBUMIN 3.6 (L) 09/26/2019   ALBUMIN 3.5 (L) 08/29/2019   ALBUMIN 2.4 (L) 08/01/2019   LABURIC 7.1 08/29/2019   LABURIC 17.5 (H) 05/24/2014   LABURIC 16.1 (H) 05/21/2014    Lab Results  Component Value Date   MG 1.7 08/01/2019   MG 1.9 07/30/2019   MG 1.8 07/29/2019   No results found for: VD25OH  No results found for: PREALBUMIN CBC EXTENDED Latest Ref Rng & Units 08/29/2019 08/01/2019 07/30/2019  WBC 3.4 - 10.8 x10E3/uL 5.1 5.7 6.8  RBC 3.77 - 5.28 x10E6/uL 4.91 4.70 4.56  HGB 11.1 - 15.9 g/dL 11.0(L) 10.4(L) 10.0(L)  HCT 34.0 - 46.6 % 35.4 33.2(L) 31.8(L)  PLT 150 - 450 x10E3/uL 195 281 254  NEUTROABS 1.4 - 7.0 x10E3/uL 2.7 3.2 -  LYMPHSABS 0.7 - 3.1 x10E3/uL 1.0 1.1 -     There is no height or weight on file to calculate BMI.  Orders:  No orders of the defined types were placed in this encounter.  No orders of the defined types were placed in this encounter.    Procedures: Large Joint Inj: R subacromial bursa on 10/30/2019 4:25 PM Indications: diagnostic evaluation and pain Details: 22 G 1.5 in needle, posterior approach  Arthrogram: No  Medications: 5 mL lidocaine 1 %; 40 mg methylPREDNISolone  acetate 40 MG/ML Outcome: tolerated well, no immediate complications Procedure, treatment alternatives, risks and benefits explained, specific risks discussed. Consent was given by the patient. Immediately prior to procedure a time out was called to verify the correct patient, procedure, equipment, support staff and site/side marked as required. Patient was prepped and draped in the usual sterile fashion.      Clinical Data: No additional findings.  ROS:  All other systems negative, except as noted in the HPI. Review of Systems  Objective: Vital Signs: There were no vitals taken for this visit.  Specialty Comments:  No specialty comments available.  PMFS History: Patient Active Problem List   Diagnosis Date Noted  . AKI (acute kidney injury) ([place]) 07/27/2019  . COVID-19 virus infection  01/02/2019  . Right knee pain 09/15/2018  . Closed head injury with concussion 09/06/2018  . Acute CVA (cerebrovascular accident) ([place]) 09/06/2018  . Hypokalemia 09/06/2018  . Hypomagnesemia 09/06/2018  . Asthma, chronic obstructive, with acute exacerbation ([place]) 08/02/2018  . Plantar fasciitis 04/14/2017  . Microcytosis 11/28/2015  . 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 Onychomycosis 01/09/2015  . Ingrown nail 01/09/2015  . Pain in lower limb 01/09/2015  . Chronic respiratory failure with hypoxia and hypercapnia ([place]) 07/19/2014  . CHF (congestive heart failure) ([place]) 06/11/2014  . Anemia, iron deficiency 05/27/2014  . Acute gouty arthritis 05/25/2014  . Acute renal failure superimposed on stage 3 chronic kidney disease ([place]) 05/25/2014  . Obstructive sleep apnea 05/25/2014  . Joint pain 05/24/2014  . Chronic diastolic CHF (congestive heart failure) ([place])   . Sarcoidosis   . Metabolic syndrome 56/43/3295  . Asthma, chronic 04/27/2014  . Anemia of chronic disease 04/19/2014  . Morbid (severe) obesity due to excess calories ([place]) 04/18/2014  . Immunization due 04/18/2014  . Need for prophylactic vaccination and inoculation against influenza 04/18/2014  . Prediabetes 04/13/2014  . Essential hypertension 04/13/2014  . Lower extremity edema 04/13/2014  . History of lung cancer 04/13/2014   Past Medical History:  Diagnosis Date  . Arthritis   . Asthma   . Cancer ([place])    lung, adenocarcinoma right lung 2012  . CHF (congestive heart failure) (HCC)    Preserved EF  . Congenital single kidney    With chronic kidney disease  . COPD (chronic obstructive pulmonary disease) ([place])   . Gout   . Hypertension   . Lung cancer [facility]) 2012   Right upper lobe lung adenocarcinoma diagnosed with needle biopsy treated by SBRT finished treatment April 2013 has been monitored since  . Mass of chest wall, right    Right chest wall mass 7.3 cm biopsy on 12/13/2013. Patient notes it was consistent with  sarcoidosis but actual pathology results not available.  . Oxygen deficiency   . Sarcoidosis   . Sickle cell trait ([place])   . Sleep apnea     Family History  Problem Relation Age of Onset  . Hypertension Mother   . Renal Disease Mother   . Cancer Father        stomach  . Heart disease Father        No details  . Cancer Brother   . Diabetes Brother   . Cervical cancer Sister   . Diabetes Sister   . Multiple myeloma Sister     Past Surgical History:  Procedure Laterality Date  . LOOP RECORDER INSERTION N/A 09/08/2018   Procedure: LOOP RECORDER INSERTION;  Surgeon: Evans Lance, MD;  Location: [facility];  Service:  Cardiovascular;  Laterality: N/A;  . LUNG BIOPSY    . TEE WITHOUT CARDIOVERSION N/A 09/08/2018   Procedure: TRANSESOPHAGEAL ECHOCARDIOGRAM (TEE);  Surgeon: Josue Hector, MD;  Location: [facility];  Service: Cardiovascular;  Laterality: N/A;  with loop  . TUBAL LIGATION    . VEIN LIGATION AND STRIPPING     Social History   Occupational History  . Not on file  Tobacco Use  . Smoking status: Never Smoker  . Smokeless tobacco: Never Used  Substance and Sexual Activity  . Alcohol use: No    Alcohol/week: 0.0 standard drinks  . Drug use: No  . Sexual activity: Not Currently

## 2019-11-03 ENCOUNTER — Other Ambulatory Visit: Payer: Self-pay | Admitting: Nurse Practitioner

## 2019-11-03 ENCOUNTER — Telehealth: Payer: Self-pay | Admitting: Nurse Practitioner

## 2019-11-03 DIAGNOSIS — M109 Gout, unspecified: Secondary | ICD-10-CM

## 2019-11-03 MED ORDER — FEBUXOSTAT 40 MG PO TABS: 40.0000 mg | ORAL_TABLET | Freq: Every day | ORAL | 1 refills | Status: DC

## 2019-11-03 NOTE — Telephone Encounter (Signed)
Sent!

## 2019-11-08 ENCOUNTER — Ambulatory Visit: Payer: Medicare Other | Attending: Internal Medicine

## 2019-11-08 DIAGNOSIS — Z23 Encounter for immunization: Secondary | ICD-10-CM

## 2019-11-08 NOTE — Progress Notes (Signed)
   Covid-19 Vaccination Clinic  Name:  Bijal Siglin    MRN: [id number] DOB: Aug 04, 1950  11/08/2019  Ms. Hochstetler was observed post Covid-19 immunization for 15 minutes without incident. She was provided with Vaccine Information Sheet and instruction to access the V-Safe system.   Ms. Mckinny was instructed to call 911 with any severe reactions post vaccine: [name] Kitchen Difficulty breathing  . Swelling of face and throat  . A fast heartbeat  . A bad rash all over body  . Dizziness and weakness   Immunizations Administered    Name Date Dose VIS Date Route   Pfizer COVID-19 Vaccine 11/08/2019 11:40 AM 0.3 mL 09/13/2018 Intramuscular   Manufacturer: [place]   Lot: [id number]   [place]: [id number]

## 2019-11-09 ENCOUNTER — Other Ambulatory Visit: Payer: Self-pay

## 2019-11-09 DIAGNOSIS — D869 Sarcoidosis, unspecified: Secondary | ICD-10-CM

## 2019-11-09 MED ORDER — ALBUTEROL SULFATE (2.5 MG/3ML) 0.083% IN NEBU: 2.5000 mg | INHALATION_SOLUTION | Freq: Four times a day (QID) | RESPIRATORY_TRACT | 0 refills | Status: DC | PRN

## 2019-11-10 ENCOUNTER — Telehealth: Payer: Self-pay | Admitting: Family Medicine

## 2019-11-10 ENCOUNTER — Other Ambulatory Visit: Payer: Self-pay | Admitting: Family Medicine

## 2019-11-10 ENCOUNTER — Other Ambulatory Visit: Payer: Self-pay

## 2019-11-10 DIAGNOSIS — M109 Gout, unspecified: Secondary | ICD-10-CM

## 2019-11-10 DIAGNOSIS — D869 Sarcoidosis, unspecified: Secondary | ICD-10-CM

## 2019-11-10 DIAGNOSIS — J4530 Mild persistent asthma, uncomplicated: Secondary | ICD-10-CM

## 2019-11-10 MED ORDER — BUDESONIDE-FORMOTEROL FUMARATE 160-4.5 MCG/ACT IN AERO: 2.0000 | INHALATION_SPRAY | Freq: Two times a day (BID) | RESPIRATORY_TRACT | 5 refills | Status: DC

## 2019-11-10 MED ORDER — FEBUXOSTAT 40 MG PO TABS: 40.0000 mg | ORAL_TABLET | Freq: Every day | ORAL | 1 refills | Status: DC

## 2019-11-10 MED ORDER — ALBUTEROL SULFATE (2.5 MG/3ML) 0.083% IN NEBU: 2.5000 mg | INHALATION_SOLUTION | Freq: Four times a day (QID) | RESPIRATORY_TRACT | 0 refills | Status: DC | PRN

## 2019-11-10 MED ORDER — CALCITRIOL 0.25 MCG PO CAPS: 0.2500 ug | ORAL_CAPSULE | Freq: Every day | ORAL | 2 refills | Status: DC

## 2019-11-10 MED ORDER — ALBUTEROL SULFATE HFA 108 (90 BASE) MCG/ACT IN AERS: 2.0000 | INHALATION_SPRAY | Freq: Four times a day (QID) | RESPIRATORY_TRACT | 3 refills | Status: DC | PRN

## 2019-11-10 NOTE — Progress Notes (Signed)
Meds ordered this encounter  Medications  . albuterol (PROAIR HFA) 108 (90 Base) MCG/ACT inhaler    Sig: Inhale 2 puffs into the lungs every 6 (six) hours as needed for wheezing or shortness of breath.    Dispense:  8 g    Refill:  3    Order Specific Question:   Supervising Provider    Answer:   Tresa Garter [id number]  . calcitRIOL (ROCALTROL) 0.25 MCG capsule    Sig: Take 1 capsule (0.25 mcg total) by mouth daily.    Dispense:  30 capsule    Refill:  2    Order Specific Question:   Supervising Provider    Answer:   Tresa Garter [[id number]]     [name]  APRN, MSN, FNP-C Patient [facility] [address] [place], [place] [address] [phone]

## 2019-11-13 NOTE — Telephone Encounter (Signed)
Done

## 2019-11-23 ENCOUNTER — Other Ambulatory Visit: Payer: Medicare Other

## 2019-11-23 ENCOUNTER — Other Ambulatory Visit: Payer: Self-pay

## 2019-11-23 ENCOUNTER — Telehealth: Payer: Self-pay | Admitting: Family Medicine

## 2019-11-23 DIAGNOSIS — I10 Essential (primary) hypertension: Secondary | ICD-10-CM

## 2019-11-23 NOTE — Telephone Encounter (Signed)
Done

## 2019-11-23 NOTE — Telephone Encounter (Signed)
   Patient [facility]   Returned patient's call, no answer, left message.    [name]  APRN, MSN, FNP-C Patient [facility] [address] [place], [place] [address] [phone]

## 2019-11-24 LAB — BASIC METABOLIC PANEL
BUN/Creatinine Ratio: 28 (ref 12–28)
BUN: 66 mg/dL — ABNORMAL HIGH (ref 8–27)
CO2: 30 mmol/L — ABNORMAL HIGH (ref 20–29)
Calcium: 9.1 mg/dL (ref 8.7–10.3)
Chloride: 91 mmol/L — ABNORMAL LOW (ref 96–106)
Creatinine, Ser: 2.35 mg/dL — ABNORMAL HIGH (ref 0.57–1.00)
GFR calc Af Amer: 24 mL/min/{1.73_m2} — ABNORMAL LOW (ref >59)
GFR calc non Af Amer: 21 mL/min/{1.73_m2} — ABNORMAL LOW (ref >59)
Glucose: 116 mg/dL — ABNORMAL HIGH (ref 65–99)
Potassium: 2.9 mmol/L — ABNORMAL LOW (ref 3.5–5.2)
Sodium: 139 mmol/L (ref 134–144)

## 2019-11-27 ENCOUNTER — Other Ambulatory Visit: Payer: Self-pay

## 2019-11-27 ENCOUNTER — Ambulatory Visit (INDEPENDENT_AMBULATORY_CARE_PROVIDER_SITE_OTHER): Payer: Medicare Other | Admitting: Orthopedic Surgery

## 2019-11-27 ENCOUNTER — Encounter: Payer: Self-pay | Admitting: Orthopedic Surgery

## 2019-11-27 VITALS — Ht 66.0 in | Wt 332.0 lb

## 2019-11-27 DIAGNOSIS — M7541 Impingement syndrome of right shoulder: Secondary | ICD-10-CM

## 2019-11-27 DIAGNOSIS — I639 Cerebral infarction, unspecified: Secondary | ICD-10-CM | POA: Diagnosis not present

## 2019-11-28 ENCOUNTER — Encounter: Payer: Self-pay | Admitting: Orthopedic Surgery

## 2019-11-28 NOTE — Progress Notes (Signed)
Office Visit Note   Patient: Pamela [name]           Date of Birth: 1951/02/11           MRN: [id number] Visit Date: 11/27/2019              Requested by: Dorena Dew, FNP [address],  [place] [address] PCP: Dorena Dew, FNP  Chief Complaint  Patient presents with  . Right Shoulder - Follow-up    10/30/19 right shoulder cortisone injection       HPI:  Patient is a 69 year old woman who is seen in follow-up for impingement symptoms right shoulder.  Patient states that the previous subacromial injection did provide good relief.  She states she has much improved range of motion.   Assessment & Plan: Visit Diagnoses:  1. Impingement syndrome of right shoulder     Plan: Continue with activities as tolerated no restrictions if she has recurrent symptoms we could follow-up for repeat injection.  Follow-Up Instructions: Return if symptoms worsen or fail to improve.   Ortho Exam  Patient is alert, oriented, no adenopathy, well-dressed, normal affect, normal respiratory effort. Examination patient has full range of motion of both shoulders at this time she has no pain with Neer or Hawkins impingement test of the right shoulder no adhesive capsulitis.  Imaging: No results found. No images are attached to the encounter.  Labs: Lab Results  Component Value Date   HGBA1C 6.1 (H) 03/01/2019   HGBA1C 6.0 (H) 09/07/2018   HGBA1C 5.5 09/01/2018   ESRSEDRATE 29 05/26/2016   ESRSEDRATE 38 (H) 05/09/2015   ESRSEDRATE 83 (H) 05/24/2014   CRP 1.2 (H) 01/05/2019   CRP 2.5 (H) 01/04/2019   CRP 4.3 (H) 01/03/2019   LABURIC 7.1 08/29/2019   LABURIC 17.5 (H) 05/24/2014   LABURIC 16.1 (H) 05/21/2014   REPTSTATUS 09/11/2018 FINAL 09/06/2018   CULT NO GROWTH 5 DAYS 09/06/2018     Lab Results  Component Value Date   ALBUMIN 3.6 (L) 09/26/2019   ALBUMIN 3.5 (L) 08/29/2019   ALBUMIN 2.4 (L) 08/01/2019   LABURIC 7.1 08/29/2019   LABURIC 17.5 (H)  05/24/2014   LABURIC 16.1 (H) 05/21/2014    Lab Results  Component Value Date   MG 1.7 08/01/2019   MG 1.9 07/30/2019   MG 1.8 07/29/2019   No results found for: VD25OH  No results found for: PREALBUMIN CBC EXTENDED Latest Ref Rng & Units 08/29/2019 08/01/2019 07/30/2019  WBC 3.4 - 10.8 x10E3/uL 5.1 5.7 6.8  RBC 3.77 - 5.28 x10E6/uL 4.91 4.70 4.56  HGB 11.1 - 15.9 g/dL 11.0(L) 10.4(L) 10.0(L)  HCT 34.0 - 46.6 % 35.4 33.2(L) 31.8(L)  PLT 150 - 450 x10E3/uL 195 281 254  NEUTROABS 1.4 - 7.0 x10E3/uL 2.7 3.2 -  LYMPHSABS 0.7 - 3.1 x10E3/uL 1.0 1.1 -     Body mass index is 53.59 kg/m.  Orders:  No orders of the defined types were placed in this encounter.  No orders of the defined types were placed in this encounter.    Procedures: No procedures performed  Clinical Data: No additional findings.  ROS:  All other systems negative, except as noted in the HPI. Review of Systems  Objective: Vital Signs: Ht _0  (1.676 m)   Wt (!) 332 lb (150.6 kg)   BMI 53.59 kg/m   Specialty Comments:  No specialty comments available.  PMFS History: Patient Active Problem List   Diagnosis Date Noted  .  AKI (acute kidney injury) ([place]) 07/27/2019  . COVID-19 virus infection 01/02/2019  . Right knee pain 09/15/2018  . Closed head injury with concussion 09/06/2018  . Acute CVA (cerebrovascular accident) ([place]) 09/06/2018  . Hypokalemia 09/06/2018  . Hypomagnesemia 09/06/2018  . Asthma, chronic obstructive, with acute exacerbation ([place]) 08/02/2018  . Plantar fasciitis 04/14/2017  . Microcytosis 11/28/2015  . 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 Onychomycosis 01/09/2015  . Ingrown nail 01/09/2015  . Pain in lower limb 01/09/2015  . Chronic respiratory failure with hypoxia and hypercapnia ([place]) 07/19/2014  . CHF (congestive heart failure) ([place]) 06/11/2014  . Anemia, iron deficiency 05/27/2014  . Acute gouty arthritis 05/25/2014  . Acute renal failure superimposed on stage 3 chronic  kidney disease ([place]) 05/25/2014  . Obstructive sleep apnea 05/25/2014  . Joint pain 05/24/2014  . Chronic diastolic CHF (congestive heart failure) ([place])   . Sarcoidosis   . Metabolic syndrome 70/62/3762  . Asthma, chronic 04/27/2014  . Anemia of chronic disease 04/19/2014  . Morbid (severe) obesity due to excess calories ([place]) 04/18/2014  . Immunization due 04/18/2014  . Need for prophylactic vaccination and inoculation against influenza 04/18/2014  . Prediabetes 04/13/2014  . Essential hypertension 04/13/2014  . Lower extremity edema 04/13/2014  . History of lung cancer 04/13/2014   Past Medical History:  Diagnosis Date  . Arthritis   . Asthma   . Cancer ([place])    lung, adenocarcinoma right lung 2012  . CHF (congestive heart failure) (H[facility])    Preserved EF  . Congenital single kidney    With chronic kidney disease  . COPD (chronic obstructive pulmonary disease) ([place])   . Gout   . Hypertension   . Lung cancer [facility]) 2012   Right upper lobe lung adenocarcinoma diagnosed with needle biopsy treated by SBRT finished treatment April 2013 has been monitored since  . Mass of chest wall, right    Right chest wall mass 7.3 cm biopsy on 12/13/2013. Patient notes it was consistent with sarcoidosis but actual pathology results not available.  . Oxygen deficiency   . Sarcoidosis   . Sickle cell trait ([place])   . Sleep apnea     Family History  Problem Relation Age of Onset  . Hypertension Mother   . Renal Disease Mother   . Cancer Father        stomach  . Heart disease Father        No details  . Cancer Brother   . Diabetes Brother   . Cervical cancer Sister   . Diabetes Sister   . Multiple myeloma Sister     Past Surgical History:  Procedure Laterality Date  . LOOP RECORDER INSERTION N/A 09/08/2018   Procedure: LOOP RECORDER INSERTION;  Surgeon: Evans Lance, MD;  Location: [facility];  Service: Cardiovascular;  Laterality: N/A;  . LUNG BIOPSY    . TEE WITHOUT  CARDIOVERSION N/A 09/08/2018   Procedure: TRANSESOPHAGEAL ECHOCARDIOGRAM (TEE);  Surgeon: Josue Hector, MD;  Location: [facility] ENDOSCOPY;  Service: Cardiovascular;  Laterality: N/A;  with loop  . TUBAL LIGATION    . VEIN LIGATION AND STRIPPING     Social History   Occupational History  . Not on file  Tobacco Use  . Smoking status: Never Smoker  . Smokeless tobacco: Never Used  Substance and Sexual Activity  . Alcohol use: No    Alcohol/week: 0.0 standard drinks  . Drug use: No  . Sexual activity: Not Currently

## 2019-11-30 ENCOUNTER — Telehealth: Payer: Self-pay

## 2019-11-30 NOTE — Progress Notes (Signed)
Pamela [name]    HEMATOLOGY/ONCOLOGY CLINIC NOTE  Date of Service: 12/09/17   Patient Care Team: Dorena Dew, FNP as PCP - General (Family Medicine)  CHIEF COMPLAINTS/PURPOSE OF CONSULTATION:  Followup of lung cancer  DIAGNOSIS 1. Right upper lobe T1b, N0, M0 primary lung adenocarcinoma diagnosed in December 2012. She was deemed not to be a surgical candidate. Treated by radiation oncology with SBRT at [facility]. Completed treatment in March 2013 and has been monitored since with no evidence of recurrence based on PET/CT scan done on 05/20/2015.  2.  Right anterior chest wall slowly growing masslike lesion in the right pectoralis minor muscle. Unclear etiology. Biopsy was done on 12/14/2013 at the [place] [facility] [name]. FNA showed skeletal muscles, macrophages and spindle cells some with cytologic atypia. Needle core biopsy showed soft tissue and skeletal muscle with extensive necrosis; rare spindle cells with cytologic atypia. Treatment effect causing reactive atypia cannot be ruled out. Clinical correlation was advised to ensure the sample is representative. PET/CT scan done here on 05/20/2015 shows no change in metabolic activity or size of the medial right chest wall mass suggesting benign etiology.  INTERVAL HISTORY  Ms Pamela [name] is here for her scheduled follow-up. The patient's last visit with [place] was on 06/23/2018. The pt reports that she is doing well overall.  The pt reports she is good. Pt has been off oxygen. She had a stroke February of last year. Pt also had COVID19 of June last year and was hospitalized. She has had both doses of COVID19 vaccine. She was hospitalized January of this year due to allergic reaction to bactrim.  Pt is having pain on right chest wall for about a month. The pain is not constant and does not hurt while taking deep breath or straining for bowl movement. Pt attributes it to gas. She has lost weight since last year. Pt has been coughing and bringing  up clear phlegm.   Lab results today (11/23/19) of CMP is as follows: all values are WNL except for Glucose at 116, BUN at 66, Creatinine at 2.35, GFR Calc Non Af Am at 21, GFR, Calc Af Amer at 24, Potassium at 2.9, Chloride at 91, CO2 at 30 08/29/19 of CBC w/Diff is as follows: all values are WNL except for Hemoglobin at 11, MCV at 72, MCH at 22.4, MCHC at 31.1, RDW at 16.2, EOS Abs at 1.0  On review of systems, pt reports cough, right chest wall pain and denies any other symptoms.   MEDICAL HISTORY:  Past Medical History:  Diagnosis Date  . Arthritis   . Asthma   . Cancer ([place])    lung, adenocarcinoma right lung 2012  . CHF (congestive heart failure) (HCC)    Preserved EF  . Congenital single kidney    With chronic kidney disease  . COPD (chronic obstructive pulmonary disease) ([place])   . Gout   . Hypertension   . Lung cancer [facility]) 2012   Right upper lobe lung adenocarcinoma diagnosed with needle biopsy treated by SBRT finished treatment April 2013 has been monitored since  . Mass of chest wall, right    Right chest wall mass 7.3 cm biopsy on 12/13/2013. Patient notes it was consistent with sarcoidosis but actual pathology results not available.  . Oxygen deficiency   . Sarcoidosis   . Sickle cell trait ([place])   . Sleep apnea    CKD ([place] 11/21/2015 showed no urinary tract abnormalities.) Gout on Uloric - tea as a trigger.  SURGICAL HISTORY: Past Surgical History:  Procedure Laterality Date  . LOOP RECORDER INSERTION N/A 09/08/2018   Procedure: LOOP RECORDER INSERTION;  Surgeon: Evans Lance, MD;  Location: [place] [facility];  Service: Cardiovascular;  Laterality: N/A;  . LUNG BIOPSY    . TEE WITHOUT CARDIOVERSION N/A 09/08/2018   Procedure: TRANSESOPHAGEAL ECHOCARDIOGRAM (TEE);  Surgeon: Josue Hector, MD;  Location: [facility] ENDOSCOPY;  Service: Cardiovascular;  Laterality: N/A;  with loop  . TUBAL LIGATION    . VEIN LIGATION AND STRIPPING      SOCIAL HISTORY: Social History     Socioeconomic History  . Marital status: Divorced    Spouse name: Not on file  . Number of children: 2  . Years of education: Not on file  . Highest education level: Not on file  Occupational History  . Not on file  Tobacco Use  . Smoking status: Never Smoker  . Smokeless tobacco: Never Used  Substance and Sexual Activity  . Alcohol use: No    Alcohol/week: 0.0 standard drinks  . Drug use: No  . Sexual activity: Not Currently  Other Topics Concern  . Not on file  Social History Narrative   Lives with son.  One son is deceased.     Social Determinants of Health   Financial Resource Strain:   . Difficulty of Paying Living Expenses:   Food Insecurity:   . Worried About Charity fundraiser in the Last Year:   . Arboriculturist in the Last Year:   Transportation Needs:   . Film/video editor (Medical):   Pamela [name] Lack of Transportation (Non-Medical):   Physical Activity:   . Days of Exercise per Week:   . Minutes of Exercise per Session:   Stress:   . Feeling of Stress :   Social Connections:   . Frequency of Communication with Friends and Family:   . Frequency of Social Gatherings with Friends and Family:   . Attends Religious Services:   . Active Member of Clubs or Organizations:   . Attends Archivist Meetings:   Pamela [name] Marital Status:   Intimate Partner Violence:   . Fear of Current or Ex-Partner:   . Emotionally Abused:   Pamela [name] Physically Abused:   . Sexually Abused:     FAMILY HISTORY: Family History  Problem Relation Age of Onset  . Hypertension Mother   . Renal Disease Mother   . Cancer Father        stomach  . Heart disease Father        No details  . Cancer Brother   . Diabetes Brother   . Cervical cancer Sister   . Diabetes Sister   . Multiple myeloma Sister     ALLERGIES:  is allergic to sulfa antibiotics.  MEDICATIONS:  Current Outpatient Medications  Medication Sig Dispense Refill  . acetaminophen (TYLENOL) 500 MG tablet Take 1,000 mg  by mouth every 6 (six) hours as needed for mild pain.     Pamela [name] albuterol (PROAIR HFA) 108 (90 Base) MCG/ACT inhaler Inhale 2 puffs into the lungs every 6 (six) hours as needed for wheezing or shortness of breath. 8 g 3  . albuterol (PROVENTIL) (2.5 MG/3ML) 0.083% nebulizer solution Take 3 mLs (2.5 mg total) by nebulization every 6 (six) hours as needed for wheezing or shortness of breath. 100 mL 0  . aspirin 325 MG tablet Take 1 tablet (325 mg total) by mouth daily. 30 tablet 0  . atorvastatin (  LIPITOR) 40 MG tablet Take 1 tablet (40 mg total) by mouth daily at 6 PM. 90 tablet 1  . budesonide-formoterol (SYMBICORT) 160-4.5 MCG/ACT inhaler Inhale 2 puffs into the lungs 2 (two) times daily. 18 g 5  . calcitRIOL (ROCALTROL) 0.25 MCG capsule Take 1 capsule (0.25 mcg total) by mouth daily. 30 capsule 2  . calcium carbonate (TUMS - DOSED IN MG ELEMENTAL CALCIUM) 500 MG chewable tablet Chew 1-2 tablets by mouth daily as needed for indigestion (gas).     Pamela [name] dextromethorphan (DELSYM) 30 MG/5ML liquid Take by mouth as needed for cough.    . docusate sodium (COLACE) 100 MG capsule Take 1 capsule (100 mg total) by mouth 2 (two) times daily. 60 capsule 0  . febuxostat (ULORIC) 40 MG tablet Take 1 tablet (40 mg total) by mouth daily. 90 tablet 1  . furosemide (LASIX) 40 MG tablet Take 1 tablet (40 mg total) by mouth 2 (two) times daily. For CHF 180 tablet 1  . montelukast (SINGULAIR) 10 MG tablet Take 1 tablet (10 mg total) by mouth at bedtime. 90 tablet 1   No current facility-administered medications for this visit.    REVIEW OF SYSTEMS:   A 10+ POINT REVIEW OF SYSTEMS WAS OBTAINED including neurology, dermatology, psychiatry, cardiac, respiratory, lymph, extremities, GI, GU, Musculoskeletal, constitutional, breasts, reproductive, HEENT.  All pertinent positives are noted in the HPI.  All others are negative.   PHYSICAL EXAMINATION:  ECOG PERFORMANCE STATUS: 2-3  . Vitals:   12/01/19 1124  BP: (!) 132/54    Pulse: 68  Resp: 17  Temp: 98 F (36.7 C)  SpO2: 96%   Filed Weights   12/01/19 1124  Weight: (!) 329 lb 12.8 oz (149.6 kg)   .Body mass index is 53.23 kg/m.   . Wt Readings from Last 3 Encounters:  12/01/19 (!) 329 lb 12.8 oz (149.6 kg)  11/27/19 (!) 332 lb (150.6 kg)  10/24/19 (!) 332 lb (150.6 kg)   Exam given in chair   GENERAL:alert, in no acute distress and comfortable SKIN: no acute rashes, no significant lesions EYES: conjunctiva are pink and non-injected, sclera anicteric OROPHARYNX: MMM, no exudates, no oropharyngeal erythema or ulceration NECK: supple, no JVD LYMPH:  no palpable lymphadenopathy in the cervical, axillary or inguinal regions LUNGS: clear to auscultation b/l with normal respiratory effort, mild wheezing in both lungs  HEART: regular rate & rhythm ABDOMEN:  normoactive bowel sounds , non tender, not distended. Extremity: grade 1 pedal edema PSYCH: alert & oriented x 3 with fluent speech NEURO: no focal motor/sensory deficits  LABORATORY DATA:  I have reviewed the data as listed  . CBC Latest Ref Rng & Units 08/29/2019 08/01/2019 07/30/2019  WBC 3.4 - 10.8 x10E3/uL 5.1 5.7 6.8  Hemoglobin 11.1 - 15.9 g/dL 11.0(L) 10.4(L) 10.0(L)  Hematocrit 34.0 - 46.6 % 35.4 33.2(L) 31.8(L)  Platelets 150 - 450 x10E3/uL 195 281 254   . CBC    Component Value Date/Time   WBC 5.1 08/29/2019 0941   WBC 5.7 08/01/2019 0515   RBC 4.91 08/29/2019 0941   RBC 4.70 08/01/2019 0515   HGB 11.0 (L) 08/29/2019 0941   HGB 11.8 05/31/2017 1050   HCT 35.4 08/29/2019 0941   HCT 37.5 05/31/2017 1050   PLT 195 08/29/2019 0941   MCV 72 (L) 08/29/2019 0941   MCV 71.2 (L) 05/31/2017 1050   MCH 22.4 (L) 08/29/2019 0941   MCH 22.1 (L) 08/01/2019 0515   MCHC 31.1 (L) 08/29/2019 1694  MCHC 31.3 08/01/2019 0515   RDW 16.2 (H) 08/29/2019 0941   RDW 15.1 (H) 05/31/2017 1050   LYMPHSABS 1.0 08/29/2019 0941   LYMPHSABS 1.2 05/31/2017 1050   MONOABS 0.4 08/01/2019 0515    MONOABS 0.4 05/31/2017 1050   EOSABS 1.0 (H) 08/29/2019 0941   BASOSABS 0.0 08/29/2019 0941   BASOSABS 0.0 05/31/2017 1050     . CMP Latest Ref Rng & Units 11/23/2019 10/24/2019 09/26/2019  Glucose 65 - 99 mg/dL 116(H) 94 115(H)  BUN 8 - 27 mg/dL 66(H) 54(H) 47(H)  Creatinine 0.57 - 1.00 mg/dL 2.35(H) 2.18(H) 1.86(H)  Sodium 134 - 144 mmol/L 139 141 143  Potassium 3.5 - 5.2 mmol/L 2.9(L) 3.4(L) 3.5  Chloride 96 - 106 mmol/L 91(L) 96 97  CO2 20 - 29 mmol/L 30(H) 25 25  Calcium 8.7 - 10.3 mg/dL 9.1 8.5(L) 9.2  Total Protein 6.0 - 8.5 g/dL - - 6.8  Total Bilirubin 0.0 - 1.2 mg/dL - - 0.4  Alkaline Phos 39 - 117 IU/L - - 186(H)  AST 0 - 40 IU/L - - 28  ALT 0 - 32 IU/L - - 15    RADIOGRAPHIC STUDIES: I have personally reviewed the radiological images as listed and agreed with the findings in the report. No results found.   ASSESSMENT & PLAN:   69 y.o. African-American female with   #1 History of right upper lobe T1b, N0, M0 primary lung adenocarcinoma most in December 2012. She was deemed not to be a surgical candidate. Treated by radiation oncology with SBRT at [facility]. Completed treatment in March 2013 and has been monitored since with no evidence of recurrence. no evidence of recurrence based on PET/CT scan done on 05/20/2015. On clinical visit today the patient has no new change in her breathing and no new focal symptoms.  Hemoglobin is stable.   CT chest 05/26/2016 shows no evidence of local recurrence of cancer or metastatic disease. CT Chest, 05/31/2017 shows no evidence of local recurrence of cancer or metastatic disease.   #2 Right anterior chest wall slowly growing masslike lesion in the right pectoralis minor muscle. Unclear etiology. FNA showed skeletal muscles, macrophages and spindle cells some with cytologic atypia. Needle core biopsy showed soft tissue and skeletal muscle with extensive necrosis; rare spindle cells with cytologic atypia. Treatment effect  causing reactive atypia cannot be ruled out.  PET/CT scan done here on 05/20/2015 shows no change in metabolic activity or size of the medial right chest wall mass suggesting benign etiology. Could certainly be related to her sarcoidosis . Previously discussed with the patient the option of biopsying the mass for a more definitive diagnosis versus monitoring it. She chooses to monitor it at this time given that imaging findings suggest a likely benign etiology that hasnt changed over the last 18 months. CT chest 05/26/2016 shows that this lesion is stable compared to previous imaging. -CT Chest, 05/31/2017 shows that this lesion is stable. No locally recurrent or metastatic disease.   PLAN:  -Discussed pt labwork today, 12/01/19; of CMP is as follows: all values are WNL except for Glucose at 116, BUN at 66, Creatinine at 2.35, GFR Calc Non Af Am at 21, GFR, Calc Af Amer at 24, Potassium at 2.9, Chloride at 91, CO2 at 30 -Discussed 08/29/19 of CBC w/Diff is as follows: all values are WNL except for Hemoglobin at 11, MCV at 72, MCH at 22.4, MCHC at 31.1, RDW at 16.2, EOS Abs at 1.0 -Advised on right wall  pain  -Since the pt has not shown any radiographic, clinical, or lab evidence of returned or progressive lung cancer after 5 years from completing her treatment, we will begin once a year follow ups -Continue f/u with PCP, nephrologist, cardiologist etc.  -Will get CT Chest  -Will see back via phone in 2 weeks  #3 history of extensive sarcoidosis involving lymphadenopathy in the chest, abdomen as well as spleen and possibly liver .previously treated with steroids by Dr. Bartholome Bill, pulmonary at [facility] .has recently established care with Dr. Melvyn Novas for her pulmonary and sarcoidosis management.   Plan -Continue followup with Dr. Melvyn Novas For further management   #4 history of sickle cell trait. Hemoglobin electrophoresis is not available. Hgb at 11.4 today.  Microcytosis likely from chronic  disease from her sarcoidosis vs possible alpha thal trait. Ferritin levels WNL  Plan   -No indication for additional iron replacement at this time.  FOLLOW UP: CT chest without contrast in 5-7 days Phone visit with Dr Irene Limbo in 2 weeks   The total time spent in the appt was 20 minutes and more than 50% was on counseling and direct patient cares.  All of the patient's questions were answered with apparent satisfaction. The patient knows to call the clinic with any problems, questions or concerns.  Sullivan Lone MD [place] AAHIVMS [facility] Hematology/Oncology Physician [facility]  (Office):       [phone] (Work cell):  [phone] (Fax):           [phone]  I, Dawayne Cirri am acting as a scribe for Dr. Sullivan Lone.   .I have reviewed the above documentation for accuracy and completeness, and I agree with the above. Brunetta Genera MD

## 2019-11-30 NOTE — Telephone Encounter (Signed)
-----   Message from Dorena Dew, [place] sent at 11/29/2019  2:25 PM EDT ----- Regarding: lab results Please inform patient that on review creatinine, indicator kidney function is elevated over her baseline.  Recommend that she continue to follow closely with nephrology.  Also, follow a low-fat, low carbohydrate diet.  It is also important that patient adheres to low-sodium regimen as well.  We will continue to follow kidney functioning closely.  Please fax results to [facility].   [name]  APRN, MSN, FNP-C [facility] [place] [address] [place], [place] [address] [phone]

## 2019-11-30 NOTE — Telephone Encounter (Signed)
Called notified pt of her lab results , pt understood results, and had no concerns.Faxed results to [facility].

## 2019-12-01 ENCOUNTER — Inpatient Hospital Stay: Payer: Medicare Other | Attending: Hematology | Admitting: Hematology

## 2019-12-01 ENCOUNTER — Other Ambulatory Visit: Payer: Self-pay

## 2019-12-01 VITALS — BP 132/54 | HR 68 | Temp 98.0°F | Resp 17 | Ht 66.0 in | Wt 329.8 lb

## 2019-12-01 DIAGNOSIS — Z7951 Long term (current) use of inhaled steroids: Secondary | ICD-10-CM | POA: Insufficient documentation

## 2019-12-01 DIAGNOSIS — Z7982 Long term (current) use of aspirin: Secondary | ICD-10-CM | POA: Insufficient documentation

## 2019-12-01 DIAGNOSIS — Z923 Personal history of irradiation: Secondary | ICD-10-CM | POA: Diagnosis not present

## 2019-12-01 DIAGNOSIS — D869 Sarcoidosis, unspecified: Secondary | ICD-10-CM | POA: Diagnosis not present

## 2019-12-01 DIAGNOSIS — Z79899 Other long term (current) drug therapy: Secondary | ICD-10-CM | POA: Diagnosis not present

## 2019-12-01 DIAGNOSIS — C3490 Malignant neoplasm of unspecified part of unspecified bronchus or lung: Secondary | ICD-10-CM

## 2019-12-01 DIAGNOSIS — I509 Heart failure, unspecified: Secondary | ICD-10-CM | POA: Diagnosis not present

## 2019-12-01 DIAGNOSIS — I130 Hypertensive heart and chronic kidney disease with heart failure and stage 1 through stage 4 chronic kidney disease, or unspecified chronic kidney disease: Secondary | ICD-10-CM | POA: Insufficient documentation

## 2019-12-01 DIAGNOSIS — Z8673 Personal history of transient ischemic attack (TIA), and cerebral infarction without residual deficits: Secondary | ICD-10-CM | POA: Insufficient documentation

## 2019-12-01 DIAGNOSIS — Z85118 Personal history of other malignant neoplasm of bronchus and lung: Secondary | ICD-10-CM | POA: Diagnosis not present

## 2019-12-01 DIAGNOSIS — N189 Chronic kidney disease, unspecified: Secondary | ICD-10-CM | POA: Diagnosis not present

## 2019-12-01 DIAGNOSIS — I639 Cerebral infarction, unspecified: Secondary | ICD-10-CM

## 2019-12-01 DIAGNOSIS — G35 Multiple sclerosis: Secondary | ICD-10-CM | POA: Diagnosis not present

## 2019-12-01 DIAGNOSIS — J449 Chronic obstructive pulmonary disease, unspecified: Secondary | ICD-10-CM | POA: Diagnosis not present

## 2019-12-01 DIAGNOSIS — D573 Sickle-cell trait: Secondary | ICD-10-CM | POA: Insufficient documentation

## 2019-12-01 DIAGNOSIS — G4730 Sleep apnea, unspecified: Secondary | ICD-10-CM | POA: Diagnosis not present

## 2019-12-08 ENCOUNTER — Other Ambulatory Visit: Payer: Self-pay

## 2019-12-08 ENCOUNTER — Ambulatory Visit (HOSPITAL_COMMUNITY)
Admission: RE | Admit: 2019-12-08 | Discharge: 2019-12-08 | Disposition: A | Discharge status: Home / Self Care | Payer: Medicare Other | Source: Ambulatory Visit | Attending: Hematology | Admitting: Hematology

## 2019-12-08 DIAGNOSIS — C3490 Malignant neoplasm of unspecified part of unspecified bronchus or lung: Secondary | ICD-10-CM | POA: Insufficient documentation

## 2019-12-12 ENCOUNTER — Encounter: Payer: Self-pay | Admitting: Podiatry

## 2019-12-12 ENCOUNTER — Other Ambulatory Visit: Payer: Self-pay

## 2019-12-12 ENCOUNTER — Ambulatory Visit (INDEPENDENT_AMBULATORY_CARE_PROVIDER_SITE_OTHER): Payer: Medicare Other | Admitting: Podiatry

## 2019-12-12 DIAGNOSIS — B351 Tinea unguium: Secondary | ICD-10-CM | POA: Diagnosis not present

## 2019-12-12 DIAGNOSIS — M79674 Pain in right toe(s): Secondary | ICD-10-CM | POA: Diagnosis not present

## 2019-12-12 DIAGNOSIS — M79675 Pain in left toe(s): Secondary | ICD-10-CM

## 2019-12-12 NOTE — Progress Notes (Signed)
Subjective: 69 y.o. returns the office today for painful, elongated, thickened toenails which she cannot trim herself. Denies any redness or drainage around the nails. No significant pain to other areas of the feet today and no recent changes. Denies any acute changes since last appointment and no new complaints today. Denies any systemic complaints such as fevers, chills, nausea, vomiting.   PCP: Dorena Dew, FNP  Objective: AAO 3, NAD DP 2/4, PT 1/4, CRT less than 3 seconds; chronic lower extremity edema present  Nails hypertrophic, dystrophic, elongated, brittle, discolored 10. There is tenderness overlying the nails 1-5 bilaterally. There is no surrounding erythema or drainage along the nail sites. No open lesions or pre-ulcerative lesions are identified. No other areas of tenderness bilateral lower extremities. No overlying edema, erythema, increased warmth. No pain with calf compression, swelling, warmth, erythema.  Assessment: Patient presents with symptomatic onychomycosis  Plan: -Treatment options including alternatives, risks, complications were discussed -Nails sharply debrided 10 without complication/bleeding. -Discussed daily foot inspection. If there are any changes, to call the office immediately.  -Follow-up in 3 months or sooner if any problems are to arise. In the meantime, encouraged to call the office with any questions, concerns, changes symptoms.  Celesta Gentile, DPM

## 2019-12-14 NOTE — Progress Notes (Signed)
   HEMATOLOGY/ONCOLOGY CLINIC NOTE  Date of Service: 12/09/17   Patient Care Team: Pamela [name], Pamela M, FNP as PCP - General (Family Medicine)  CHIEF COMPLAINTS/PURPOSE OF CONSULTATION:  Followup of lung cancer  DIAGNOSIS 1. Right upper lobe T1b, N0, M0 primary lung adenocarcinoma diagnosed in December 2012. She was deemed not to be a surgical candidate. Treated by radiation oncology with SBRT at [facility]. Completed treatment in March 2013 and has been monitored since with no evidence of recurrence based on PET/CT scan done on 05/20/2015.  2.  Right anterior chest wall slowly growing masslike lesion in the right pectoralis minor muscle. Unclear etiology. Biopsy was done on 12/14/2013 at [facility]. FNA showed skeletal muscles, macrophages and spindle cells some with cytologic atypia. Needle core biopsy showed soft tissue and skeletal muscle with extensive necrosis; rare spindle cells with cytologic atypia. Treatment effect causing reactive atypia cannot be ruled out. Clinical correlation was advised to ensure the sample is representative. PET/CT scan done here on 05/20/2015 shows no change in metabolic activity or size of the medial right chest wall mass suggesting benign etiology.  INTERVAL HISTORY  Pamela [name] is here for her scheduled follow-up. The patient's last visit with us was on 12/01/19. The pt reports that she is doing well overall.  The pt reports she is good. She is still wheezing and has been using inhalers.   Of note since the patient's last visit, pt has had CT Chest Wo Contrast ([id number]) completed on 12/08/19 with results revealing "1. Unchanged post treatment appearance of the right lung with bandlike radiation fibrosis and consolidation of the right upper lobe. No evidence of malignant recurrence or metastatic disease in the chest 2. Stable small bilateral pulmonary nodules measuring up to 4 mm, very likely benign. 3. Unchanged soft tissue  mass of the right pectoralis major and minor musculature, measuring approximately 5.1 x 3.0 cm, of uncertain nature. 4.  Aortic Atherosclerosis (ICD10-I70.0)."  On review of systems, pt reports wheezing and denies any other symptoms.    MEDICAL HISTORY:  Past Medical History:  Diagnosis Date  . Arthritis   . Asthma   . Cancer (HCC)    lung, adenocarcinoma right lung 2012  . CHF (congestive heart failure) (HCC)    Preserved EF  . Congenital single kidney    With chronic kidney disease  . COPD (chronic obstructive pulmonary disease) (HCC)   . Gout   . Hypertension   . Lung cancer (HCC) 2012   Right upper lobe lung adenocarcinoma diagnosed with needle biopsy treated by SBRT finished treatment April 2013 has been monitored since  . Mass of chest wall, right    Right chest wall mass 7.3 cm biopsy on 12/13/2013. Patient notes it was consistent with sarcoidosis but actual pathology results not available.  . Oxygen deficiency   . Sarcoidosis   . Sickle cell trait (HCC)   . Sleep apnea    CKD (US 11/21/2015 showed no urinary tract abnormalities.) Gout on Uloric - tea as a trigger.  SURGICAL HISTORY: Past Surgical History:  Procedure Laterality Date  . LOOP RECORDER INSERTION N/A 09/08/2018   Procedure: LOOP RECORDER INSERTION;  Surgeon: Taylor, Gregg W, MD;  Location: [facility] INVASIVE CV LAB;  Service: Cardiovascular;  Laterality: N/A;  . LUNG BIOPSY    . TEE WITHOUT CARDIOVERSION N/A 09/08/2018   Procedure: TRANSESOPHAGEAL ECHOCARDIOGRAM (TEE);  Surgeon: Nishan, Peter C, MD;  Location: [facility] ENDOSCOPY;  Service: Cardiovascular;  Laterality: N/A;  with   loop  . TUBAL LIGATION    . VEIN LIGATION AND STRIPPING      SOCIAL HISTORY: Social History   Socioeconomic History  . Marital status: Divorced    Spouse name: Not on file  . Number of children: 2  . Years of education: Not on file  . Highest education level: Not on file  Occupational History  . Not on file  Tobacco Use  . Smoking  status: Never Smoker  . Smokeless tobacco: Never Used  Substance and Sexual Activity  . Alcohol use: No    Alcohol/week: 0.0 standard drinks  . Drug use: No  . Sexual activity: Not Currently  Other Topics Concern  . Not on file  Social History Narrative   Lives with son.  One son is deceased.     Social Determinants of Health   Financial Resource Strain:   . Difficulty of Paying Living Expenses:   Food Insecurity:   . Worried About Running Out of Food in the Last Year:   . Ran Out of Food in the Last Year:   Transportation Needs:   . Lack of Transportation (Medical):   . Lack of Transportation (Non-Medical):   Physical Activity:   . Days of Exercise per Week:   . Minutes of Exercise per Session:   Stress:   . Feeling of Stress :   Social Connections:   . Frequency of Communication with Friends and Family:   . Frequency of Social Gatherings with Friends and Family:   . Attends Religious Services:   . Active Member of Clubs or Organizations:   . Attends Club or Organization Meetings:   . Marital Status:   Intimate Partner Violence:   . Fear of Current or Ex-Partner:   . Emotionally Abused:   . Physically Abused:   . Sexually Abused:     FAMILY HISTORY: Family History  Problem Relation Age of Onset  . Hypertension Mother   . Renal Disease Mother   . Cancer Father        stomach  . Heart disease Father        No details  . Cancer Brother   . Diabetes Brother   . Cervical cancer Sister   . Diabetes Sister   . Multiple myeloma Sister     ALLERGIES:  is allergic to sulfa antibiotics.  MEDICATIONS:  Current Outpatient Medications  Medication Sig Dispense Refill  . acetaminophen (TYLENOL) 500 MG tablet Take 1,000 mg by mouth every 6 (six) hours as needed for mild pain.     . albuterol (PROAIR HFA) 108 (90 Base) MCG/ACT inhaler Inhale 2 puffs into the lungs every 6 (six) hours as needed for wheezing or shortness of breath. 8 g 3  . albuterol (PROVENTIL) (2.5  MG/3ML) 0.083% nebulizer solution Take 3 mLs (2.5 mg total) by nebulization every 6 (six) hours as needed for wheezing or shortness of breath. 100 mL 0  . aspirin 325 MG tablet Take 1 tablet (325 mg total) by mouth daily. 30 tablet 0  . atorvastatin (LIPITOR) 40 MG tablet Take 1 tablet (40 mg total) by mouth daily at 6 PM. 90 tablet 1  . budesonide-formoterol (SYMBICORT) 160-4.5 MCG/ACT inhaler Inhale 2 puffs into the lungs 2 (two) times daily. 18 g 5  . calcitRIOL (ROCALTROL) 0.25 MCG capsule Take 1 capsule (0.25 mcg total) by mouth daily. 30 capsule 2  . calcium carbonate (TUMS - DOSED IN MG ELEMENTAL CALCIUM) 500 MG chewable tablet Chew 1-2 tablets by mouth   daily as needed for indigestion (gas).     [name] Kitchen dextromethorphan (DELSYM) 30 MG/5ML liquid Take by mouth as needed for cough.    . docusate sodium (COLACE) 100 MG capsule Take 1 capsule (100 mg total) by mouth 2 (two) times daily. 60 capsule 0  . febuxostat (ULORIC) 40 MG tablet Take 1 tablet (40 mg total) by mouth daily. 90 tablet 1  . furosemide (LASIX) 40 MG tablet Take 1 tablet (40 mg total) by mouth 2 (two) times daily. For CHF 180 tablet 1  . metolazone (ZAROXOLYN) 2.5 MG tablet     . montelukast (SINGULAIR) 10 MG tablet Take 1 tablet (10 mg total) by mouth at bedtime. 90 tablet 1   No current facility-administered medications for this visit.    REVIEW OF SYSTEMS:   A 10+ POINT REVIEW OF SYSTEMS WAS OBTAINED including neurology, dermatology, psychiatry, cardiac, respiratory, lymph, extremities, GI, GU, Musculoskeletal, constitutional, breasts, reproductive, HEENT.  All pertinent positives are noted in the HPI.  All others are negative.   PHYSICAL EXAMINATION:  ECOG PERFORMANCE STATUS: 2-3  . Vitals:   12/15/19 1254 12/15/19 1257  BP: 132/70   Pulse: 74 71  Resp: 18   Temp: 97.6 F (36.4 C)   SpO2: 91% 95%   Filed Weights   12/15/19 1254  Weight: (!) 332 lb 6.4 oz (150.8 kg)   .Body mass index is 53.65 kg/[name].   . Wt  Readings from Last 3 Encounters:  12/15/19 (!) 332 lb 6.4 oz (150.8 kg)  12/01/19 (!) 329 lb 12.8 oz (149.6 kg)  11/27/19 (!) 332 lb (150.6 kg)   GENERAL:alert, in no acute distress and comfortable SKIN: no acute rashes, no significant lesions EYES: conjunctiva are pink and non-injected, sclera anicteric OROPHARYNX: MMM, no exudates, no oropharyngeal erythema or ulceration NECK: supple, no JVD LYMPH:  no palpable lymphadenopathy in the cervical, axillary or inguinal regions LUNGS: clear to auscultation b/l with normal respiratory effort HEART: regular rate & rhythm ABDOMEN:  normoactive bowel sounds , non tender, not distended. Extremity: no pedal edema PSYCH: alert & oriented x 3 with fluent speech NEURO: no focal motor/sensory deficits  LABORATORY DATA:  I have reviewed the data as listed  CBC Latest Ref Rng & Units 08/29/2019 08/01/2019 07/30/2019  WBC 3.4 - 10.8 x10E3/uL 5.1 5.7 6.8  Hemoglobin 11.1 - 15.9 g/dL 11.0(L) 10.4(L) 10.0(L)  Hematocrit 34.0 - 46.6 % 35.4 33.2(L) 31.8(L)  Platelets 150 - 450 x10E3/uL 195 281 254   . CBC    Component Value Date/Time   WBC 5.1 08/29/2019 0941   WBC 5.7 08/01/2019 0515   RBC 4.91 08/29/2019 0941   RBC 4.70 08/01/2019 0515   HGB 11.0 (L) 08/29/2019 0941   HGB 11.8 05/31/2017 1050   HCT 35.4 08/29/2019 0941   HCT 37.5 05/31/2017 1050   PLT 195 08/29/2019 0941   MCV 72 (L) 08/29/2019 0941   MCV 71.2 (L) 05/31/2017 1050   MCH 22.4 (L) 08/29/2019 0941   MCH 22.1 (L) 08/01/2019 0515   MCHC 31.1 (L) 08/29/2019 0941   MCHC 31.3 08/01/2019 0515   RDW 16.2 (H) 08/29/2019 0941   RDW 15.1 (H) 05/31/2017 1050   LYMPHSABS 1.0 08/29/2019 0941   LYMPHSABS 1.2 05/31/2017 1050   MONOABS 0.4 08/01/2019 0515   MONOABS 0.4 05/31/2017 1050   EOSABS 1.0 (H) 08/29/2019 0941   BASOSABS 0.0 08/29/2019 0941   BASOSABS 0.0 05/31/2017 1050     . CMP Latest Ref Rng & Units 11/23/2019 10/24/2019  09/26/2019  Glucose 65 - 99 mg/dL 116(H) 94 115(H)  BUN 8  - 27 mg/dL 66(H) 54(H) 47(H)  Creatinine 0.57 - 1.00 mg/dL 2.35(H) 2.18(H) 1.86(H)  Sodium 134 - 144 mmol/L 139 141 143  Potassium 3.5 - 5.2 mmol/L 2.9(L) 3.4(L) 3.5  Chloride 96 - 106 mmol/L 91(L) 96 97  CO2 20 - 29 mmol/L 30(H) 25 25  Calcium 8.7 - 10.3 mg/dL 9.1 8.5(L) 9.2  Total Protein 6.0 - 8.5 g/dL - - 6.8  Total Bilirubin 0.0 - 1.2 mg/dL - - 0.4  Alkaline Phos 39 - 117 IU/L - - 186(H)  AST 0 - 40 IU/L - - 28  ALT 0 - 32 IU/L - - 15    RADIOGRAPHIC STUDIES: I have personally reviewed the radiological images as listed and agreed with the findings in the report. CT Chest Wo Contrast  Result Date: 12/09/2019 CLINICAL DATA:  Non-small cell lung cancer, surveillance, status post XRT EXAM: CT CHEST WITHOUT CONTRAST TECHNIQUE: Multidetector CT imaging of the chest was performed following the standard protocol without IV contrast. COMPARISON:  06/02/2018, 05/31/2017 FINDINGS: Cardiovascular: Aortic atherosclerosis. Normal heart size. No pericardial effusion. Mediastinum/Nodes: No enlarged mediastinal, hilar, or axillary lymph nodes. Thyroid gland, trachea, and esophagus demonstrate no significant findings. Lungs/Pleura: Unchanged post treatment appearance of the right lung with bandlike radiation fibrosis and consolidation of the right upper lobe (series 5, image 54). Stable, benign small pulmonary nodules, for example a 4 pulmonary nodule of the posterior right upper lobe (series 5, image 48). No pleural effusion or pneumothorax. Upper Abdomen: No acute abnormality. Musculoskeletal: No suspicious bone lesions identified. Unchanged soft tissue mass of the right pectoralis major and minor musculature, measuring approximately 5.1 x 3.0 cm (series 2, image 57) IMPRESSION: 1. Unchanged post treatment appearance of the right lung with bandlike radiation fibrosis and consolidation of the right upper lobe. No evidence of malignant recurrence or metastatic disease in the chest 2. Stable small bilateral  pulmonary nodules measuring up to 4 mm, very likely benign. 3. Unchanged soft tissue mass of the right pectoralis major and minor musculature, measuring approximately 5.1 x 3.0 cm, of uncertain nature. 4.  Aortic Atherosclerosis (ICD10-I70.0). Electronically Signed   By: Alex  Bibbey [name].D.   On: 12/09/2019 15:28     ASSESSMENT & PLAN:   69 y.o. African-American female with   #1 History of right upper lobe T1b, N0, M0 primary lung adenocarcinoma most in December 2012. She was deemed not to be a surgical candidate. Treated by radiation oncology with SBRT at [facility]. Completed treatment in March 2013 and has been monitored since with no evidence of recurrence. no evidence of recurrence based on PET/CT scan done on 05/20/2015. On clinical visit today the patient has no new change in her breathing and no new focal symptoms.  Hemoglobin is stable.   CT chest 05/26/2016 shows no evidence of local recurrence of cancer or metastatic disease. CT Chest, 05/31/2017 shows no evidence of local recurrence of cancer or metastatic disease.   #2 Right anterior chest wall slowly growing masslike lesion in the right pectoralis minor muscle. Unclear etiology. FNA showed skeletal muscles, macrophages and spindle cells some with cytologic atypia. Needle core biopsy showed soft tissue and skeletal muscle with extensive necrosis; rare spindle cells with cytologic atypia. Treatment effect causing reactive atypia cannot be ruled out.  PET/CT scan done here on 05/20/2015 shows no change in metabolic activity or size of the medial right chest wall mass suggesting benign etiology. Could   certainly be related to her sarcoidosis . Previously discussed with the patient the option of biopsying the mass for a more definitive diagnosis versus monitoring it. She chooses to monitor it at this time given that imaging findings suggest a likely benign etiology that hasnt changed over the last 18 months. CT chest 05/26/2016 shows  that this lesion is stable compared to previous imaging. -CT Chest, 05/31/2017 shows that this lesion is stable. No locally recurrent or metastatic disease.   PLAN:  -Discussed 12/08/19 of CT Chest Wo Contrast ([id number]) -No new signs of cancer or spread of cancer -Since the pt has not shown any radiographic, clinical, or lab evidence of returned or progressive lung cancer after 5 years from completing her treatment, we will begin once a year follow ups -Continue f/u with PCP, nephrologist, cardiologist etc.  -Will see back in 12 months    #3 history of extensive sarcoidosis involving lymphadenopathy in the chest, abdomen as well as spleen and possibly liver .previously treated with steroids by Dr. Jhulka, pulmonary at [facility] .has recently established care with Dr. [name] for her pulmonary and sarcoidosis management.   Plan -Continue followup with Dr. [name] For further management   #4 history of sickle cell trait. Hemoglobin electrophoresis is not available. Hgb at 11.4 today.  Microcytosis likely from chronic disease from her sarcoidosis vs possible alpha thal trait. Ferritin levels WNL  Plan   -No indication for additional iron replacement at this time.  FOLLOW UP: RTC with Dr Kale with labs in 12 months CT chest in 11 months   The total time spent in the appt was 10 minutes and more than 50% was on counseling and direct patient cares.  All of the patient's questions were answered with apparent satisfaction. The patient knows to call the clinic with any problems, questions or concerns.  Gautam Kale MD Pamela AAHIVMS [facility] Hematology/Oncology Physician [facility]  (Office):       [phone] (Work cell):  [phone] (Fax):           [phone]  I, Elaine Hinkel am acting as a scribe for Dr. Gautam Kale.   .I have reviewed the above documentation for accuracy and completeness, and I agree with the above. .Gautam Kishore Kale MD        

## 2019-12-15 ENCOUNTER — Other Ambulatory Visit: Payer: Self-pay

## 2019-12-15 ENCOUNTER — Inpatient Hospital Stay (HOSPITAL_BASED_OUTPATIENT_CLINIC_OR_DEPARTMENT_OTHER): Payer: Medicare Other | Admitting: Hematology

## 2019-12-15 VITALS — BP 132/70 | HR 71 | Temp 97.6°F | Resp 18 | Ht 66.0 in | Wt 332.4 lb

## 2019-12-15 DIAGNOSIS — I639 Cerebral infarction, unspecified: Secondary | ICD-10-CM | POA: Diagnosis not present

## 2019-12-15 DIAGNOSIS — J441 Chronic obstructive pulmonary disease with (acute) exacerbation: Secondary | ICD-10-CM | POA: Diagnosis not present

## 2019-12-15 DIAGNOSIS — Q600 Renal agenesis, unilateral: Secondary | ICD-10-CM | POA: Diagnosis not present

## 2019-12-15 DIAGNOSIS — J9621 Acute and chronic respiratory failure with hypoxia: Secondary | ICD-10-CM | POA: Diagnosis not present

## 2019-12-15 DIAGNOSIS — I5032 Chronic diastolic (congestive) heart failure: Secondary | ICD-10-CM | POA: Diagnosis not present

## 2019-12-15 DIAGNOSIS — I130 Hypertensive heart and chronic kidney disease with heart failure and stage 1 through stage 4 chronic kidney disease, or unspecified chronic kidney disease: Secondary | ICD-10-CM | POA: Diagnosis not present

## 2019-12-15 DIAGNOSIS — C3490 Malignant neoplasm of unspecified part of unspecified bronchus or lung: Secondary | ICD-10-CM

## 2019-12-15 DIAGNOSIS — R0602 Shortness of breath: Secondary | ICD-10-CM | POA: Diagnosis not present

## 2019-12-15 DIAGNOSIS — Z20822 Contact with and (suspected) exposure to covid-19: Secondary | ICD-10-CM | POA: Diagnosis not present

## 2019-12-17 ENCOUNTER — Encounter (HOSPITAL_COMMUNITY): Payer: Self-pay | Admitting: Internal Medicine

## 2019-12-17 ENCOUNTER — Inpatient Hospital Stay (HOSPITAL_COMMUNITY)
Admission: EM | Admit: 2019-12-17 | Discharge: 2019-12-20 | DRG: 190 | Disposition: A | Discharge status: Home / Self Care | Payer: Medicare Other | Attending: Internal Medicine | Admitting: Internal Medicine

## 2019-12-17 ENCOUNTER — Other Ambulatory Visit: Payer: Self-pay

## 2019-12-17 ENCOUNTER — Emergency Department (HOSPITAL_COMMUNITY): Payer: Medicare Other

## 2019-12-17 DIAGNOSIS — Z85118 Personal history of other malignant neoplasm of bronchus and lung: Secondary | ICD-10-CM | POA: Diagnosis not present

## 2019-12-17 DIAGNOSIS — Z20822 Contact with and (suspected) exposure to covid-19: Secondary | ICD-10-CM | POA: Diagnosis present

## 2019-12-17 DIAGNOSIS — R0902 Hypoxemia: Secondary | ICD-10-CM | POA: Diagnosis not present

## 2019-12-17 DIAGNOSIS — Z841 Family history of disorders of kidney and ureter: Secondary | ICD-10-CM

## 2019-12-17 DIAGNOSIS — I130 Hypertensive heart and chronic kidney disease with heart failure and stage 1 through stage 4 chronic kidney disease, or unspecified chronic kidney disease: Secondary | ICD-10-CM | POA: Diagnosis present

## 2019-12-17 DIAGNOSIS — Z923 Personal history of irradiation: Secondary | ICD-10-CM | POA: Diagnosis not present

## 2019-12-17 DIAGNOSIS — J9621 Acute and chronic respiratory failure with hypoxia: Secondary | ICD-10-CM | POA: Diagnosis present

## 2019-12-17 DIAGNOSIS — M1990 Unspecified osteoarthritis, unspecified site: Secondary | ICD-10-CM | POA: Diagnosis present

## 2019-12-17 DIAGNOSIS — Z6841 Body Mass Index (BMI) 40.0 and over, adult: Secondary | ICD-10-CM

## 2019-12-17 DIAGNOSIS — R4702 Dysphasia: Secondary | ICD-10-CM | POA: Diagnosis not present

## 2019-12-17 DIAGNOSIS — D869 Sarcoidosis, unspecified: Secondary | ICD-10-CM | POA: Diagnosis not present

## 2019-12-17 DIAGNOSIS — R0689 Other abnormalities of breathing: Secondary | ICD-10-CM | POA: Diagnosis not present

## 2019-12-17 DIAGNOSIS — I10 Essential (primary) hypertension: Secondary | ICD-10-CM | POA: Diagnosis present

## 2019-12-17 DIAGNOSIS — Z79899 Other long term (current) drug therapy: Secondary | ICD-10-CM

## 2019-12-17 DIAGNOSIS — R7302 Impaired glucose tolerance (oral): Secondary | ICD-10-CM | POA: Diagnosis present

## 2019-12-17 DIAGNOSIS — M109 Gout, unspecified: Secondary | ICD-10-CM | POA: Diagnosis present

## 2019-12-17 DIAGNOSIS — Z833 Family history of diabetes mellitus: Secondary | ICD-10-CM

## 2019-12-17 DIAGNOSIS — Z8249 Family history of ischemic heart disease and other diseases of the circulatory system: Secondary | ICD-10-CM

## 2019-12-17 DIAGNOSIS — D573 Sickle-cell trait: Secondary | ICD-10-CM | POA: Diagnosis present

## 2019-12-17 DIAGNOSIS — I5032 Chronic diastolic (congestive) heart failure: Secondary | ICD-10-CM | POA: Diagnosis present

## 2019-12-17 DIAGNOSIS — G4730 Sleep apnea, unspecified: Secondary | ICD-10-CM | POA: Diagnosis present

## 2019-12-17 DIAGNOSIS — Q600 Renal agenesis, unilateral: Secondary | ICD-10-CM | POA: Diagnosis not present

## 2019-12-17 DIAGNOSIS — R7303 Prediabetes: Secondary | ICD-10-CM | POA: Diagnosis present

## 2019-12-17 DIAGNOSIS — N183 Chronic kidney disease, stage 3 unspecified: Secondary | ICD-10-CM | POA: Diagnosis not present

## 2019-12-17 DIAGNOSIS — R0602 Shortness of breath: Secondary | ICD-10-CM | POA: Diagnosis present

## 2019-12-17 DIAGNOSIS — D860 Sarcoidosis of lung: Secondary | ICD-10-CM | POA: Diagnosis present

## 2019-12-17 DIAGNOSIS — N179 Acute kidney failure, unspecified: Secondary | ICD-10-CM | POA: Diagnosis present

## 2019-12-17 DIAGNOSIS — R062 Wheezing: Secondary | ICD-10-CM | POA: Diagnosis not present

## 2019-12-17 DIAGNOSIS — Z7982 Long term (current) use of aspirin: Secondary | ICD-10-CM

## 2019-12-17 DIAGNOSIS — Z8616 Personal history of COVID-19: Secondary | ICD-10-CM

## 2019-12-17 DIAGNOSIS — N1831 Chronic kidney disease, stage 3a: Secondary | ICD-10-CM | POA: Diagnosis present

## 2019-12-17 DIAGNOSIS — E876 Hypokalemia: Secondary | ICD-10-CM | POA: Diagnosis present

## 2019-12-17 DIAGNOSIS — R591 Generalized enlarged lymph nodes: Secondary | ICD-10-CM | POA: Diagnosis present

## 2019-12-17 DIAGNOSIS — Z9851 Tubal ligation status: Secondary | ICD-10-CM | POA: Diagnosis not present

## 2019-12-17 DIAGNOSIS — J441 Chronic obstructive pulmonary disease with (acute) exacerbation: Principal | ICD-10-CM

## 2019-12-17 DIAGNOSIS — Z7951 Long term (current) use of inhaled steroids: Secondary | ICD-10-CM

## 2019-12-17 DIAGNOSIS — Z882 Allergy status to sulfonamides status: Secondary | ICD-10-CM

## 2019-12-17 DIAGNOSIS — J9601 Acute respiratory failure with hypoxia: Secondary | ICD-10-CM | POA: Diagnosis present

## 2019-12-17 LAB — COMPREHENSIVE METABOLIC PANEL
ALT: 15 U/L (ref 0–44)
AST: 24 U/L (ref 15–41)
Albumin: 3.4 g/dL — ABNORMAL LOW (ref 3.5–5.0)
Alkaline Phosphatase: 144 U/L — ABNORMAL HIGH (ref 38–126)
Anion gap: 14 (ref 5–15)
BUN: 59 mg/dL — ABNORMAL HIGH (ref 8–23)
CO2: 31 mmol/L (ref 22–32)
Calcium: 8.9 mg/dL (ref 8.9–10.3)
Chloride: 96 mmol/L — ABNORMAL LOW (ref 98–111)
Creatinine, Ser: 2.14 mg/dL — ABNORMAL HIGH (ref 0.44–1.00)
GFR calc Af Amer: 27 mL/min — ABNORMAL LOW (ref >60)
GFR calc non Af Amer: 23 mL/min — ABNORMAL LOW (ref >60)
Glucose, Bld: 120 mg/dL — ABNORMAL HIGH (ref 70–99)
Potassium: 3.1 mmol/L — ABNORMAL LOW (ref 3.5–5.1)
Sodium: 141 mmol/L (ref 135–145)
Total Bilirubin: 0.7 mg/dL (ref 0.3–1.2)
Total Protein: 7.5 g/dL (ref 6.5–8.1)

## 2019-12-17 LAB — CBC WITH DIFFERENTIAL/PLATELET
Abs Immature Granulocytes: 0.03 10*3/uL (ref 0.00–0.07)
Basophils Absolute: 0 10*3/uL (ref 0.0–0.1)
Basophils Relative: 0 %
Eosinophils Absolute: 1.1 10*3/uL — ABNORMAL HIGH (ref 0.0–0.5)
Eosinophils Relative: 13 %
HCT: 40.6 % (ref 36.0–46.0)
Hemoglobin: 12.4 g/dL (ref 12.0–15.0)
Immature Granulocytes: 0 %
Lymphocytes Relative: 14 %
Lymphs Abs: 1.3 10*3/uL (ref 0.7–4.0)
MCH: 22.2 pg — ABNORMAL LOW (ref 26.0–34.0)
MCHC: 30.5 g/dL (ref 30.0–36.0)
MCV: 72.8 fL — ABNORMAL LOW (ref 80.0–100.0)
Monocytes Absolute: 0.3 10*3/uL (ref 0.1–1.0)
Monocytes Relative: 4 %
Neutro Abs: 6.1 10*3/uL (ref 1.7–7.7)
Neutrophils Relative %: 69 %
Platelets: 172 10*3/uL (ref 150–400)
RBC: 5.58 MIL/uL — ABNORMAL HIGH (ref 3.87–5.11)
RDW: 15.6 % — ABNORMAL HIGH (ref 11.5–15.5)
WBC: 8.9 10*3/uL (ref 4.0–10.5)
nRBC: 0 % (ref 0.0–0.2)

## 2019-12-17 LAB — SARS CORONAVIRUS 2 BY RT PCR (HOSPITAL ORDER, PERFORMED IN ~~LOC~~ HOSPITAL LAB): SARS Coronavirus 2: NEGATIVE

## 2019-12-17 LAB — HEMOGLOBIN A1C
Hgb A1c MFr Bld: 6.2 % — ABNORMAL HIGH (ref 4.8–5.6)
Mean Plasma Glucose: 131.24 mg/dL

## 2019-12-17 LAB — HIV ANTIBODY (ROUTINE TESTING W REFLEX): HIV Screen 4th Generation wRfx: NONREACTIVE

## 2019-12-17 LAB — BRAIN NATRIURETIC PEPTIDE: B Natriuretic Peptide: 27.5 pg/mL (ref 0.0–100.0)

## 2019-12-17 LAB — MAGNESIUM: Magnesium: 1.9 mg/dL (ref 1.7–2.4)

## 2019-12-17 MED ORDER — FUROSEMIDE 40 MG PO TABS
40.0000 mg | ORAL_TABLET | Freq: Two times a day (BID) | ORAL | Status: DC
  Administered 2019-12-17 – 2019-12-20 (×7): 40 mg via ORAL
  Filled 2019-12-17 (×7): qty 1

## 2019-12-17 MED ORDER — ALBUTEROL SULFATE (2.5 MG/3ML) 0.083% IN NEBU
2.5000 mg | INHALATION_SOLUTION | RESPIRATORY_TRACT | Status: DC | PRN
  Administered 2019-12-17 – 2019-12-18 (×2): 2.5 mg via RESPIRATORY_TRACT
  Filled 2019-12-17 (×2): qty 3

## 2019-12-17 MED ORDER — MOMETASONE FURO-FORMOTEROL FUM 200-5 MCG/ACT IN AERO
2.0000 | INHALATION_SPRAY | Freq: Two times a day (BID) | RESPIRATORY_TRACT | Status: DC
  Administered 2019-12-17 – 2019-12-20 (×7): 2 via RESPIRATORY_TRACT
  Filled 2019-12-17: qty 8.8

## 2019-12-17 MED ORDER — ACETAMINOPHEN 650 MG RE SUPP: 650.0000 mg | Freq: Four times a day (QID) | RECTAL | Status: DC | PRN

## 2019-12-17 MED ORDER — METHYLPREDNISOLONE SODIUM SUCC 125 MG IJ SOLR
80.0000 mg | Freq: Three times a day (TID) | INTRAMUSCULAR | Status: DC
  Administered 2019-12-17 – 2019-12-18 (×3): 80 mg via INTRAVENOUS
  Filled 2019-12-17 (×3): qty 2

## 2019-12-17 MED ORDER — MONTELUKAST SODIUM 10 MG PO TABS
10.0000 mg | ORAL_TABLET | Freq: Every day | ORAL | Status: DC
  Administered 2019-12-17 – 2019-12-20 (×4): 10 mg via ORAL
  Filled 2019-12-17 (×4): qty 1

## 2019-12-17 MED ORDER — ENOXAPARIN SODIUM 80 MG/0.8ML ~~LOC~~ SOLN
70.0000 mg | SUBCUTANEOUS | Status: DC
  Administered 2019-12-17 – 2019-12-19 (×3): 70 mg via SUBCUTANEOUS
  Filled 2019-12-17 (×3): qty 0.8

## 2019-12-17 MED ORDER — SODIUM CHLORIDE 0.9% FLUSH: 3.0000 mL | INTRAVENOUS | Status: DC | PRN

## 2019-12-17 MED ORDER — AZITHROMYCIN 250 MG PO TABS
500.0000 mg | ORAL_TABLET | Freq: Every day | ORAL | Status: DC
  Administered 2019-12-17 – 2019-12-20 (×4): 500 mg via ORAL
  Filled 2019-12-17 (×4): qty 2

## 2019-12-17 MED ORDER — CALCITRIOL 0.25 MCG PO CAPS
0.2500 ug | ORAL_CAPSULE | Freq: Every day | ORAL | Status: DC
  Administered 2019-12-17 – 2019-12-20 (×4): 0.25 ug via ORAL
  Filled 2019-12-17 (×4): qty 1

## 2019-12-17 MED ORDER — IPRATROPIUM-ALBUTEROL 0.5-2.5 (3) MG/3ML IN SOLN: 3.0000 mL | Freq: Once | RESPIRATORY_TRACT | Status: DC

## 2019-12-17 MED ORDER — POTASSIUM CHLORIDE CRYS ER 20 MEQ PO TBCR
40.0000 meq | EXTENDED_RELEASE_TABLET | ORAL | Status: AC
  Administered 2019-12-17 (×2): 40 meq via ORAL
  Filled 2019-12-17 (×2): qty 2

## 2019-12-17 MED ORDER — SENNOSIDES-DOCUSATE SODIUM 8.6-50 MG PO TABS
1.0000 | ORAL_TABLET | Freq: Every evening | ORAL | Status: DC | PRN
  Administered 2019-12-18: 1 via ORAL
  Filled 2019-12-17: qty 1

## 2019-12-17 MED ORDER — SODIUM CHLORIDE 0.9% FLUSH
3.0000 mL | Freq: Two times a day (BID) | INTRAVENOUS | Status: DC
  Administered 2019-12-17 – 2019-12-20 (×4): 3 mL via INTRAVENOUS

## 2019-12-17 MED ORDER — FEBUXOSTAT 40 MG PO TABS
40.0000 mg | ORAL_TABLET | Freq: Every day | ORAL | Status: DC
  Administered 2019-12-17 – 2019-12-20 (×4): 40 mg via ORAL
  Filled 2019-12-17 (×4): qty 1

## 2019-12-17 MED ORDER — POTASSIUM CHLORIDE 10 MEQ/100ML IV SOLN
10.0000 meq | INTRAVENOUS | Status: AC
  Administered 2019-12-17 (×2): 10 meq via INTRAVENOUS
  Filled 2019-12-17 (×2): qty 100

## 2019-12-17 MED ORDER — METOLAZONE 2.5 MG PO TABS
2.5000 mg | ORAL_TABLET | ORAL | Status: DC
  Administered 2019-12-18: 2.5 mg via ORAL
  Filled 2019-12-17 (×2): qty 1

## 2019-12-17 MED ORDER — SODIUM CHLORIDE 0.9 % IV SOLN: 250.0000 mL | INTRAVENOUS | Status: DC | PRN

## 2019-12-17 MED ORDER — ONDANSETRON HCL 4 MG PO TABS: 4.0000 mg | ORAL_TABLET | Freq: Four times a day (QID) | ORAL | Status: DC | PRN

## 2019-12-17 MED ORDER — ATORVASTATIN CALCIUM 40 MG PO TABS
40.0000 mg | ORAL_TABLET | Freq: Every day | ORAL | Status: DC
  Administered 2019-12-17 – 2019-12-20 (×4): 40 mg via ORAL
  Filled 2019-12-17 (×4): qty 1

## 2019-12-17 MED ORDER — BENZONATATE 100 MG PO CAPS
200.0000 mg | ORAL_CAPSULE | Freq: Two times a day (BID) | ORAL | Status: DC | PRN
  Administered 2019-12-18 – 2019-12-20 (×4): 200 mg via ORAL
  Filled 2019-12-17 (×5): qty 2

## 2019-12-17 MED ORDER — ASPIRIN 325 MG PO TABS
325.0000 mg | ORAL_TABLET | Freq: Every day | ORAL | Status: DC
  Administered 2019-12-17 – 2019-12-20 (×4): 325 mg via ORAL
  Filled 2019-12-17 (×4): qty 1

## 2019-12-17 MED ORDER — ONDANSETRON HCL 4 MG/2ML IJ SOLN: 4.0000 mg | Freq: Four times a day (QID) | INTRAMUSCULAR | Status: DC | PRN

## 2019-12-17 MED ORDER — ACETAMINOPHEN 325 MG PO TABS
650.0000 mg | ORAL_TABLET | Freq: Four times a day (QID) | ORAL | Status: DC | PRN
  Administered 2019-12-18 (×2): 650 mg via ORAL
  Filled 2019-12-17 (×2): qty 2

## 2019-12-17 MED ORDER — ALBUTEROL SULFATE HFA 108 (90 BASE) MCG/ACT IN AERS
4.0000 | INHALATION_SPRAY | Freq: Once | RESPIRATORY_TRACT | Status: AC
  Administered 2019-12-17: 4 via RESPIRATORY_TRACT
  Filled 2019-12-17: qty 6.7

## 2019-12-17 NOTE — ED Triage Notes (Signed)
Pt BIBA from home.    Per EMS- Pt reports SHOB worsening over one week.  Tried home nebs, albuterol at home, no help.   EMS gave- 15 mg albuterol, 1 mg atrovent, 125 solumedrol, 2 g Mg.   88% on RA.  Wheezing throughough lung fields.   Denies recent weight gain, no increased leg swelling.

## 2019-12-17 NOTE — ED Provider Notes (Signed)
[facility] DEPT Provider Note   CSN: [id number] Arrival date & time: 12/17/19  6283     History Chief Complaint  Patient presents with  . Shortness of Breath    Pamela [name] is a 69 y.o. female with pertinent past medical history of CHF, COPD, chronic kidney disease, asthma, right upper lobe lung adenocarcinoma in 2012 with radiation in 2013, hypertension, sarcoidosis that presents to the emergency department today for shortness of breath via EMS.  Patient states for the past week she has been feeling short of breath, has been keeping an eye on her oxygen which has been around 91%.  Patient states that she lives a 91%.  Is not on oxygen at home.  States that she is been having a cough for the past 2 weeks, nonproductive.  Denies any fevers, chills, chest pain, hemoptysis, leg swelling that is increased, nausea, vomiting, abdominal pain, back pain.  Patient denies any recent weight gain.  States that her legs are normally swollen, denies any calf tenderness, recent long travel, recent surgery.  Patient had Covid last year in June 2020.  Patient has gone both of her Covid vaccines.  Patient denies any sick contacts.  Denies any tobacco use.  States that she has been using her albuterol and rescue inhaler more frequently this week, states that she had to call EMS today because her shortness of breath was getting worse.  EMS gave her 15 mg of albuterol, 1 mg Atrovent, 125 Solu-Medrol and 2 g of mag.  When she arrived she was 82% on room air.  Was put on 5 L and is now satting at 95%.  Per chart review patient had CT chest without contrast on 5/21 which resulted unchanged posttreatment appearance of the right lung with bandlike radiation fibrosis and consolidation of the right upper lobe.  No evidence of malignant recurrence or metastatic disease in the chest.  There were small bilateral pulmonary nodules which they assume to be benign.  There is also unchanged soft  tissue mass of the right pectoralis major and minor.  Patient does have extensive sarcoidosis involving lymphadenopathy in the chest, and abdomen.  Recently established care with Dr. Shyrl Numbers for her pulmonology and sarcoidosis management.  HPI     Past Medical History:  Diagnosis Date  . Arthritis   . Asthma   . Cancer ([place])    lung, adenocarcinoma right lung 2012  . CHF (congestive heart failure) (HCC)    Preserved EF  . Congenital single kidney    With chronic kidney disease  . COPD (chronic obstructive pulmonary disease) ([place])   . Gout   . Hypertension   . Lung cancer [facility]) 2012   Right upper lobe lung adenocarcinoma diagnosed with needle biopsy treated by SBRT finished treatment April 2013 has been monitored since  . Mass of chest wall, right    Right chest wall mass 7.3 cm biopsy on 12/13/2013. Patient notes it was consistent with sarcoidosis but actual pathology results not available.  . Oxygen deficiency   . Sarcoidosis   . Sickle cell trait ([place])   . Sleep apnea     Patient Active Problem List   Diagnosis Date Noted  . AKI (acute kidney injury) ([place]) 07/27/2019  . COVID-19 virus infection 01/02/2019  . Right knee pain 09/15/2018  . Closed head injury with concussion 09/06/2018  . Acute CVA (cerebrovascular accident) ([place]) 09/06/2018  . Hypokalemia 09/06/2018  . Hypomagnesemia 09/06/2018  . Asthma, chronic obstructive, with acute exacerbation (  [place]) 08/02/2018  . Plantar fasciitis 04/14/2017  . Microcytosis 11/28/2015  . 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 Onychomycosis 01/09/2015  . Ingrown nail 01/09/2015  . Pain in lower limb 01/09/2015  . Chronic respiratory failure with hypoxia and hypercapnia ([place]) 07/19/2014  . CHF (congestive heart failure) ([place]) 06/11/2014  . Anemia, iron deficiency 05/27/2014  . Acute gouty arthritis 05/25/2014  . Acute renal failure superimposed on stage 3 chronic kidney disease ([place]) 05/25/2014  . Obstructive sleep apnea 05/25/2014  . Joint  pain 05/24/2014  . Chronic diastolic CHF (congestive heart failure) ([place])   . Sarcoidosis   . Metabolic syndrome 23/55/7322  . Asthma, chronic 04/27/2014  . Anemia of chronic disease 04/19/2014  . Morbid (severe) obesity due to excess calories ([place]) 04/18/2014  . Immunization due 04/18/2014  . Need for prophylactic vaccination and inoculation against influenza 04/18/2014  . Prediabetes 04/13/2014  . Essential hypertension 04/13/2014  . Lower extremity edema 04/13/2014  . History of lung cancer 04/13/2014    Past Surgical History:  Procedure Laterality Date  . LOOP RECORDER INSERTION N/A 09/08/2018   Procedure: LOOP RECORDER INSERTION;  Surgeon: Evans Lance, MD;  Location: [facility];  Service: Cardiovascular;  Laterality: N/A;  . LUNG BIOPSY    . TEE WITHOUT CARDIOVERSION N/A 09/08/2018   Procedure: TRANSESOPHAGEAL ECHOCARDIOGRAM (TEE);  Surgeon: Josue Hector, MD;  Location: [facility] ENDOSCOPY;  Service: Cardiovascular;  Laterality: N/A;  with loop  . TUBAL LIGATION    . VEIN LIGATION AND STRIPPING       OB History    Gravida  2   Para      Term      Preterm      AB      Living  1     SAB      TAB      Ectopic      Multiple      Live Births              Family History  Problem Relation Age of Onset  . Hypertension Mother   . Renal Disease Mother   . Cancer Father        stomach  . Heart disease Father        No details  . Cancer Brother   . Diabetes Brother   . Cervical cancer Sister   . Diabetes Sister   . Multiple myeloma Sister     Social History   Tobacco Use  . Smoking status: Never Smoker  . Smokeless tobacco: Never Used  Substance Use Topics  . Alcohol use: No    Alcohol/week: 0.0 standard drinks  . Drug use: No    Home Medications Prior to Admission medications   Medication Sig Start Date End Date Taking? Authorizing Provider  acetaminophen (TYLENOL) 500 MG tablet Take 1,000 mg by mouth every 6 (six) hours as needed  for mild pain.    Yes [provider]  albuterol (PROAIR HFA) 108 (90 Base) MCG/ACT inhaler Inhale 2 puffs into the lungs every 6 (six) hours as needed for wheezing or shortness of breath. 11/10/19  Yes Dorena Dew, FNP  albuterol (PROVENTIL) (2.5 MG/3ML) 0.083% nebulizer solution Take 3 mLs (2.5 mg total) by nebulization every 6 (six) hours as needed for wheezing or shortness of breath. 11/10/19  Yes Dorena Dew, FNP  aspirin 325 MG tablet Take 1 tablet (325 mg total) by mouth daily. 09/30/18  Yes Lassen, Arlo C, PA-C  atorvastatin (  LIPITOR) 40 MG tablet Take 1 tablet (40 mg total) by mouth daily at 6 PM. 04/03/19  Yes Lanae Boast, FNP  budesonide-formoterol [facility]) 160-4.5 MCG/ACT inhaler Inhale 2 puffs into the lungs 2 (two) times daily. 11/10/19  Yes Dorena Dew, FNP  calcitRIOL (ROCALTROL) 0.25 MCG capsule Take 1 capsule (0.25 mcg total) by mouth daily. 11/10/19  Yes Dorena Dew, FNP  calcium carbonate (TUMS - DOSED IN MG ELEMENTAL CALCIUM) 500 MG chewable tablet Chew 1-2 tablets by mouth daily as needed for indigestion (gas).    Yes [provider]  dextromethorphan (DELSYM) 30 MG/5ML liquid Take by mouth as needed for cough.   Yes [provider]  docusate sodium (COLACE) 100 MG capsule Take 1 capsule (100 mg total) by mouth 2 (two) times daily. 09/30/18  Yes Lassen, Arlo C, PA-C  febuxostat (ULORIC) 40 MG tablet Take 1 tablet (40 mg total) by mouth daily. 11/10/19  Yes Dorena Dew, FNP  furosemide (LASIX) 40 MG tablet Take 1 tablet (40 mg total) by mouth 2 (two) times daily. For CHF 10/13/18  Yes Lanae Boast, FNP  metolazone (ZAROXOLYN) 2.5 MG tablet Take 2.5 mg by mouth 2 (two) times a week.  11/30/19  Yes [provider]  montelukast (SINGULAIR) 10 MG tablet Take 1 tablet (10 mg total) by mouth at bedtime. 09/12/19  Yes Dorena Dew, FNP    Allergies    Sulfa antibiotics  Review of Systems   Review of Systems    Constitutional: Negative for chills, diaphoresis, fatigue and fever.  HENT: Negative for congestion, sore throat and trouble swallowing.   Eyes: Negative for pain and visual disturbance.  Respiratory: Positive for cough, shortness of breath and wheezing.   Cardiovascular: Negative for chest pain, palpitations and leg swelling.  Gastrointestinal: Negative for abdominal distention, abdominal pain, diarrhea, nausea and vomiting.  Genitourinary: Negative for difficulty urinating, dysuria, flank pain, frequency and hematuria.  Musculoskeletal: Negative for back pain, neck pain and neck stiffness.  Skin: Negative for pallor.  Neurological: Negative for dizziness, speech difficulty, weakness and headaches.  Psychiatric/Behavioral: Negative for confusion.    Physical Exam Updated Vital Signs BP (!) 114/56   Pulse 84   Temp (!) 97.5 F (36.4 C) (Oral)   Resp (!) 23   SpO2 96%   Physical Exam Constitutional:      General: She is not in acute distress.    Appearance: Normal appearance. She is not ill-appearing, toxic-appearing or diaphoretic.     Comments: Patient is sitting comfortably in bed, satting at 95% on 5 L.  Patient is not in respiratory distress, speaking in full sentences and maintaining secretions  HENT:     Head: Normocephalic and atraumatic.     Jaw: No trismus.     Nose: Nose normal. No congestion.     Mouth/Throat:     Mouth: Mucous membranes are moist. No angioedema.     Pharynx: Oropharynx is clear. No pharyngeal swelling, oropharyngeal exudate, posterior oropharyngeal erythema or uvula swelling.     Tonsils: No tonsillar exudate or tonsillar abscesses. 1+ on the right. 1+ on the left.  Eyes:     General: No scleral icterus.    Extraocular Movements: Extraocular movements intact.     Pupils: Pupils are equal, round, and reactive to light.  Cardiovascular:     Rate and Rhythm: Normal rate and regular rhythm.     Pulses: Normal pulses.     Heart sounds: Normal heart  sounds.  Pulmonary:  Effort: Pulmonary effort is normal. No respiratory distress.     Breath sounds: No stridor. Wheezing (Heard throughout all lung fields.) present. No rhonchi or rales.  Chest:     Chest wall: No tenderness.  Abdominal:     General: Abdomen is flat. There is no distension.     Palpations: Abdomen is soft.     Tenderness: There is no abdominal tenderness. There is no guarding or rebound.  Musculoskeletal:        General: No swelling or tenderness. Normal range of motion.     Cervical back: Normal range of motion and neck supple. No rigidity.     Right lower leg: Edema (Edema with no overlying signs of infection. Pitting 2+., this is her normal) present.     Left lower leg: Edema (Same as left ) present.  Skin:    General: Skin is warm and dry.     Capillary Refill: Capillary refill takes less than 2 seconds.     Coloration: Skin is not pale.     Findings: No erythema.  Neurological:     General: No focal deficit present.     Mental Status: She is alert and oriented to person, place, and time. Mental status is at baseline.     Cranial Nerves: No cranial nerve deficit.     Sensory: No sensory deficit.     Motor: No weakness.     Gait: Gait normal.  Psychiatric:        Mood and Affect: Mood normal.        Behavior: Behavior normal.        Thought Content: Thought content normal.        Judgment: Judgment normal.     ED Results / Procedures / Treatments   Labs (all labs ordered are listed, but only abnormal results are displayed) Labs Reviewed  CBC WITH DIFFERENTIAL/PLATELET - Abnormal; Notable for the following components:      Result Value   RBC 5.58 (*)    MCV 72.8 (*)    MCH 22.2 (*)    RDW 15.6 (*)    Eosinophils Absolute 1.1 (*)    All other components within normal limits  COMPREHENSIVE METABOLIC PANEL - Abnormal; Notable for the following components:   Potassium 3.1 (*)    Chloride 96 (*)    Glucose, Bld 120 (*)    BUN 59 (*)    Creatinine,  Ser 2.14 (*)    Albumin 3.4 (*)    Alkaline Phosphatase 144 (*)    GFR calc non Af Amer 23 (*)    GFR calc Af Amer 27 (*)    All other components within normal limits  SARS CORONAVIRUS 2 BY RT PCR (HOSPITAL ORDER, [facility])  BRAIN NATRIURETIC PEPTIDE    EKG EKG Interpretation  Date/Time:  Sunday Dec 17 2019 09:34:44 EDT Ventricular Rate:  70 PR Interval:    QRS Duration: 116 QT Interval:  456 QTC Calculation: 493 R Axis:   -69 Text Interpretation: SR. baseline Artifact no ischemic changes. no sig change from previous Confirmed by Charlesetta Shanks [phone]) on 12/17/2019 9:47:51 AM   Radiology DG Chest Port 1 View  Result Date: 12/17/2019 CLINICAL DATA:  Shortness of breath.  History of lung carcinoma EXAM: PORTABLE CHEST 1 VIEW COMPARISON:  Chest CT Dec 08, 2019; chest radiograph July 27, 2019 FINDINGS: Soft tissue prominence in the right perihilar region appears consistent with known radiation therapy change in this area. There  is no edema or airspace opacity. Heart is upper normal in size with pulmonary vascularity normal. No adenopathy appreciable by portable radiographic examination. Loop recorder noted on the left. No bone lesions. IMPRESSION: Soft apparent radiation therapy change in the right perihilar region. No edema or airspace opacity. Stable cardiac silhouette. Loop recorder on the left. Electronically Signed   By: Lowella Grip III M.D.   On: 12/17/2019 10:06    Procedures Procedures (including critical care time)  Medications Ordered in ED Medications  ipratropium-albuterol (DUONEB) 0.5-2.5 (3) MG/3ML nebulizer solution 3 mL (0 mLs Nebulization Hold 12/17/19 1023)  potassium chloride 10 mEq in 100 mL IVPB (10 mEq Intravenous New Bag/Given 12/17/19 1210)  albuterol (VENTOLIN HFA) 108 (90 Base) MCG/ACT inhaler 4 puff (4 puffs Inhalation Given 12/17/19 1209)    ED Course  I have reviewed the triage vital signs and the nursing  notes.  Pertinent labs & imaging results that were available during my care of the patient were reviewed by me and considered in my medical decision making (see chart for details).    MDM Rules/Calculators/A&P                     Pamela [name] is a 69 y.o. female with pertinent past medical history of CHF, COPD, chronic kidney disease, asthma, right upper lobe lung adenocarcinoma in 2012 with radiation in 2013, hypertension, sarcoidosis that presents to the emergency department today for shortness of breath via EMS.   Differential diagnoses considered include .The causes for shortness of breath include but are not limited to Cardiac (AHF, pericardial effusion and tamponade, arrhythmias, ischemia, etc) Respiratory (COPD, asthma, pneumonia, pneumothorax, primary pulmonary hypertension, PE/VQ mismatch) Hematological (anemia) Neuromuscular (ALS, Guillain-Barr, etc)   Initial interventions duoneb.  Nursing informed me that they will not start DuoNeb until Covid test has resulted, patient has gotten Covid vaccines.  Will give albuterol at this time.  Patient has already received 15 mg of albuterol, 1 mg Atrovent, 125 Solu-Medrol and 2 g of mag.   ECG interpreted by me demonstrated sinus, no change from last time.  CXR interpreted by me demonstrated no focal consolidations suggesting pneumonia.  Labs demonstrated stable CBC.  Chemistries with potassium of 3.1, will replete this. Creatinine at baseline. Alk phos at baseline. Covid swab pending.  Lowered oxygen to 2 L to see how patient does.  After 30 minutes patient was satting at 88% with 2 L.  Increased oxygen to 5 L and patient improved to 92%.  Will admit patient for new oxygen requirement.  Most likely secondary to COPD exacerbation.  . 1:01 PM discussed case with hospitalist, Dr. Jerilee Hoh who agrees to accept care of patient.  The patient appears reasonably stabilized for admission considering the current resources, flow, and  capabilities available in the ED at this time, and I doubt any other [facility] requiring further screening and/or treatment in the ED prior to admission.  I discussed this case with my attending physician who cosigned this note including patient's presenting symptoms, physical exam, and planned diagnostics and interventions. Attending physician stated agreement with plan or made changes to plan which were implemented.   Attending physician assessed patient at bedside.  Final Clinical Impression(s) / ED Diagnoses Final diagnoses:  COPD exacerbation ([place])  SOB (shortness of breath)    Rx / DC Orders ED Discharge Orders    None       Alfredia Client, PA-C 12/17/19 [place], MD 12/27/19 1406

## 2019-12-17 NOTE — H&P (Addendum)
History and Physical    Pamela [name] DJM:[id number] DOB: [birth year] DOA: 12/17/2019  Referring MD/NP/PA: Alfredia Client, ED PA PCP: Dorena Dew, FNP  Patient coming from: Home  Chief Complaint: Shortness of breath, weakness  HPI: Pamela [name] is a 69 y.o. female with multiple medical comorbidities including chronic respiratory failure no longer on baseline oxygen therapy thought secondary to a combination of asthma/COPD and sarcoidosis.  She has a history of lung cancer that was diagnosed in 2012 and completed radiation therapy.  This appears to be in remission and is followed closely by oncology.  In fact she just had a CAT scan on 5/21 that showed unchanged posttreatment appearance of the right lung with radiation fibrosis and consolidation of the right upper lobe that is chronic.  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  She states that over the past 3 days she has noticed progressive increase in shortness of breath and weakness.  The weakness was reminiscent of when she previously had a stroke so she decided to come into the hospital today for evaluation.  She denies any URI type symptoms although her voice is hoarse and this is new from yesterday, she completed her Covid vaccination series in April, she has had no sick contacts or recent travel.  She denies chest pain.  In the emergency department she was found to be hypoxemic with sats of 82% on room air, chest x-ray that again showed fibrosis that is chronic but no acute changes, lab work was unremarkable with the exception of mild hypokalemia, Covid PCR has already resulted negative.  She is afebrile.  Admission has been requested for further evaluation and management.    Past Medical/Surgical History: Past Medical History:  Diagnosis Date  . Arthritis   . Asthma   .  Cancer ([place])    lung, adenocarcinoma right lung 2012  . CHF (congestive heart failure) (HCC)    Preserved EF  . Congenital single kidney    With chronic kidney disease  . COPD (chronic obstructive pulmonary disease) ([place])   . Gout   . Hypertension   . Lung cancer [facility]) 2012   Right upper lobe lung adenocarcinoma diagnosed with needle biopsy treated by SBRT finished treatment April 2013 has been monitored since  . Mass of chest wall, right    Right chest wall mass 7.3 cm biopsy on 12/13/2013. Patient notes it was consistent with sarcoidosis but actual pathology results not available.  . Oxygen deficiency   . Sarcoidosis   . Sickle cell trait ([place])   . Sleep apnea     Past Surgical History:  Procedure Laterality Date  . LOOP RECORDER INSERTION N/A 09/08/2018   Procedure: LOOP RECORDER INSERTION;  Surgeon: Evans Lance, MD;  Location: [facility];  Service: Cardiovascular;  Laterality: N/A;  . LUNG BIOPSY    . TEE WITHOUT CARDIOVERSION N/A 09/08/2018   Procedure: TRANSESOPHAGEAL ECHOCARDIOGRAM (TEE);  Surgeon: Josue Hector, MD;  Location: [facility] ENDOSCOPY;  Service: Cardiovascular;  Laterality: N/A;  with loop  . TUBAL LIGATION    . VEIN LIGATION AND STRIPPING      Social History:  reports that she has never smoked. She has never used smokeless tobacco. She reports that she does not drink alcohol or use drugs.  Allergies: Allergies  Allergen Reactions  . Sulfa Antibiotics Hives and Rash    Family History:  Family History  Problem Relation Age of Onset  . Hypertension Mother   . Renal Disease Mother   . Cancer Father        stomach  . Heart disease Father        No details  . Cancer Brother   . Diabetes Brother   . Cervical cancer Sister   . Diabetes Sister   . Multiple myeloma Sister     Prior to Admission medications   Medication Sig Start Date End Date Taking? Authorizing Provider  acetaminophen (TYLENOL) 500 MG tablet Take 1,000 mg by mouth every 6 (six)  hours as needed for mild pain.    Yes [provider]  albuterol (PROAIR HFA) 108 (90 Base) MCG/ACT inhaler Inhale 2 puffs into the lungs every 6 (six) hours as needed for wheezing or shortness of breath. 11/10/19  Yes Dorena Dew, FNP  albuterol (PROVENTIL) (2.5 MG/3ML) 0.083% nebulizer solution Take 3 mLs (2.5 mg total) by nebulization every 6 (six) hours as needed for wheezing or shortness of breath. 11/10/19  Yes Dorena Dew, FNP  aspirin 325 MG tablet Take 1 tablet (325 mg total) by mouth daily. 09/30/18  Yes Lassen, Arlo C, PA-C  atorvastatin (LIPITOR) 40 MG tablet Take 1 tablet (40 mg total) by mouth daily at 6 PM. 04/03/19  Yes Lanae Boast, FNP  budesonide-formoterol [facility]) 160-4.5 MCG/ACT inhaler Inhale 2 puffs into the lungs 2 (two) times daily. 11/10/19  Yes Dorena Dew, FNP  calcitRIOL (ROCALTROL) 0.25 MCG capsule Take 1 capsule (0.25 mcg total) by mouth daily. 11/10/19  Yes Dorena Dew, FNP  calcium carbonate (TUMS - DOSED IN MG ELEMENTAL CALCIUM) 500 MG chewable tablet Chew 1-2 tablets by mouth daily as needed for indigestion (gas).    Yes [provider]  dextromethorphan (DELSYM) 30 MG/5ML liquid Take by mouth as needed for cough.   Yes [provider]  docusate sodium (COLACE) 100 MG capsule Take 1 capsule (100 mg total) by mouth 2 (two) times daily. 09/30/18  Yes Lassen, Arlo C, PA-C  febuxostat (ULORIC) 40 MG tablet Take 1 tablet (40 mg total) by mouth daily. 11/10/19  Yes Dorena Dew, FNP  furosemide (LASIX) 40 MG tablet Take 1 tablet (40 mg total) by mouth 2 (two) times daily. For CHF 10/13/18  Yes Lanae Boast, FNP  metolazone (ZAROXOLYN) 2.5 MG tablet Take 2.5 mg by mouth 2 (two) times a week.  11/30/19  Yes [provider]  montelukast (SINGULAIR) 10 MG tablet Take 1 tablet (10 mg total) by mouth at bedtime. 09/12/19  Yes Dorena Dew, FNP    Review of Systems:  Constitutional: Denies fever, chills,  diaphoresis, appetite change. HEENT: Denies photophobia, eye pain, redness, hearing loss, ear pain, congestion, sore throat, rhinorrhea, sneezing, mouth sores, trouble swallowing, neck pain, neck stiffness and tinnitus.   Respiratory: Denies  chest tightness,  and wheezing.   Cardiovascular: Denies chest pain, palpitations and leg swelling.  Gastrointestinal: Denies nausea, vomiting, abdominal pain, diarrhea, constipation, blood in stool and abdominal distention.  Genitourinary: Denies dysuria, urgency, frequency, hematuria, flank pain and difficulty urinating.  Endocrine: Denies: hot or cold intolerance, sweats, changes in hair or nails, polyuria, polydipsia. Musculoskeletal: Denies myalgias, back pain, joint swelling, arthralgias and gait problem.  Skin: Denies pallor, rash and wound.  Neurological: Denies dizziness, seizures, syncope, light-headedness, numbness and headaches.  Hematological: Denies adenopathy. Easy bruising, personal or family bleeding history  Psychiatric/Behavioral: Denies suicidal ideation, mood changes, confusion, nervousness, sleep disturbance  and agitation    Physical Exam: Vitals:   12/17/19 0930 12/17/19 1030 12/17/19 1130 12/17/19 1330  BP: (!) 111/58 (!) 110/57 (!) 114/56 (!) 114/58  Pulse: 74 79 84 83  Resp: 13 (!) 21 (!) 23 (!) 21  Temp:      TempSrc:      SpO2: 99% 92% 96% 98%     Constitutional: NAD, calm, comfortable, morbidly obese Eyes: PERRL, lids and conjunctivae normal, wears corrective lenses ENMT: Mucous membranes are moist. Neck: normal, supple, no masses, no thyromegaly Respiratory: Significant bilateral expiratory wheezes, fair air movement, no crackles. Normal respiratory effort. No accessory muscle use.  Cardiovascular: Regular rate and rhythm, no murmurs / rubs / gallops. No extremity edema. 2+ pedal pulses. No carotid bruits.  Abdomen: no tenderness, no masses palpated. No hepatosplenomegaly. Bowel sounds positive.  Musculoskeletal:  no clubbing / cyanosis. No joint deformity upper and lower extremities. Good ROM, no contractures. Normal muscle tone.  Skin: no rashes, lesions, ulcers. No induration Neurologic: CN 2-12 grossly intact. Sensation intact, DTR normal. Strength 5/5 in all 4.  Psychiatric: Normal judgment and insight. Alert and oriented x 3. Normal mood.    Labs on Admission: I have personally reviewed the following labs and imaging studies  CBC: Recent Labs  Lab 12/17/19 1009  WBC 8.9  NEUTROABS 6.1  HGB 12.4  HCT 40.6  MCV 72.8*  PLT 119   Basic Metabolic Panel: Recent Labs  Lab 12/17/19 1009  NA 141  K 3.1*  CL 96*  CO2 31  GLUCOSE 120*  BUN 59*  CREATININE 2.14*  CALCIUM 8.9   GFR: Estimated Creatinine Clearance: 38.6 mL/min (A) (by C-G formula based on SCr of 2.14 mg/dL (H)). Liver Function Tests: Recent Labs  Lab 12/17/19 1009  AST 24  ALT 15  ALKPHOS 144*  BILITOT 0.7  PROT 7.5  ALBUMIN 3.4*   No results for input(s): LIPASE, AMYLASE in the last 168 hours. No results for input(s): AMMONIA in the last 168 hours. Coagulation Profile: No results for input(s): INR, PROTIME in the last 168 hours. Cardiac Enzymes: No results for input(s): CKTOTAL, CKMB, CKMBINDEX, TROPONINI in the last 168 hours. BNP (last 3 results) No results for input(s): PROBNP in the last 8760 hours. HbA1C: No results for input(s): HGBA1C in the last 72 hours. CBG: No results for input(s): GLUCAP in the last 168 hours. Lipid Profile: No results for input(s): CHOL, HDL, LDLCALC, TRIG, CHOLHDL, LDLDIRECT in the last 72 hours. Thyroid Function Tests: No results for input(s): TSH, T4TOTAL, FREET4, T3FREE, THYROIDAB in the last 72 hours. Anemia Panel: No results for input(s): VITAMINB12, FOLATE, FERRITIN, TIBC, IRON, RETICCTPCT in the last 72 hours. Urine analysis:    Component Value Date/Time   COLORURINE YELLOW 07/28/2019 2033   APPEARANCEUR CLEAR 07/28/2019 2033   LABSPEC 1.011 07/28/2019 2033    PHURINE 6.0 07/28/2019 2033   GLUCOSEU NEGATIVE 07/28/2019 2033   HGBUR NEGATIVE 07/28/2019 2033   BILIRUBINUR neg 10/24/2019 1027   KETONESUR NEGATIVE 07/28/2019 2033   PROTEINUR Negative 10/24/2019 1027   PROTEINUR NEGATIVE 07/28/2019 2033   UROBILINOGEN 0.2 10/24/2019 1027   UROBILINOGEN 0.2 09/24/2017 1143   NITRITE neg 10/24/2019 1027   NITRITE NEGATIVE 07/28/2019 2033   LEUKOCYTESUR Negative 10/24/2019 1027   LEUKOCYTESUR NEGATIVE 07/28/2019 2033   Sepsis Labs: '@LABRCNTIP' (procalcitonin:4,lacticidven:4) ) Recent Results (from the past 240 hour(s))  SARS Coronavirus 2 by RT PCR (hospital order, performed in [place] hospital lab) Nasopharyngeal Nasopharyngeal Swab     Status:  None   Collection Time: 12/17/19 10:10 AM   Specimen: Nasopharyngeal Swab  Result Value Ref Range Status   SARS Coronavirus 2 NEGATIVE NEGATIVE Final    Comment: (NOTE) SARS-CoV-2 target nucleic acids are NOT DETECTED. The SARS-CoV-2 RNA is generally detectable in upper and lower respiratory specimens during the acute phase of infection. The lowest concentration of SARS-CoV-2 viral copies this assay can detect is 250 copies / mL. A negative result does not preclude SARS-CoV-2 infection and should not be used as the sole basis for treatment or other patient management decisions.  A negative result may occur with improper specimen collection / handling, submission of specimen other than nasopharyngeal swab, presence of viral mutation(s) within the areas targeted by this assay, and inadequate number of viral copies (<250 copies / mL). A negative result must be combined with clinical observations, patient history, and epidemiological information. Fact Sheet for Patients:   StrictlyIdeas.no Fact Sheet for Healthcare Providers: BankingDealers.co.za This test is not yet approved or cleared  by the [place] FDA and has been authorized for detection and/or  diagnosis of SARS-CoV-2 by FDA under an Emergency Use Authorization (EUA).  This EUA will remain in effect (meaning this test can be used) for the duration of the COVID-19 declaration under Section 564(b)(1) of the Act, 21 U.S.C. section 360bbb-3(b)(1), unless the authorization is terminated or revoked sooner. Performed at [facility], [place] [address], [place], [place] [address]      Radiological Exams on Admission: DG Chest Port 1 View  Result Date: 12/17/2019 CLINICAL DATA:  Shortness of breath.  History of lung carcinoma EXAM: PORTABLE CHEST 1 VIEW COMPARISON:  Chest CT Dec 08, 2019; chest radiograph July 27, 2019 FINDINGS: Soft tissue prominence in the right perihilar region appears consistent with known radiation therapy change in this area. There is no edema or airspace opacity. Heart is upper normal in size with pulmonary vascularity normal. No adenopathy appreciable by portable radiographic examination. Loop recorder noted on the left. No bone lesions. IMPRESSION: Soft apparent radiation therapy change in the right perihilar region. No edema or airspace opacity. Stable cardiac silhouette. Loop recorder on the left. Electronically Signed   By: Lowella Grip III M.D.   On: 12/17/2019 10:06    EKG: Independently reviewed.  Normal sinus rhythm at a rate of 70, there is a baseline artifact that makes interpretation of ST segments and T waves difficult.  Assessment/Plan Principal Problem:   Acute on chronic respiratory failure with hypoxia (HCC) Active Problems:   COPD with acute exacerbation (HCC)   Prediabetes   Essential hypertension   History of lung cancer   Morbid (severe) obesity due to excess calories (HCC)   Chronic diastolic CHF (congestive heart failure) (HCC)   Sarcoidosis   Acute renal failure superimposed on stage 3 chronic kidney disease (HCC)   Hypokalemia    Acute on chronic hypoxemic respiratory failure -Likely this is multifactorial due  to: Asthma/COPD exacerbation, pulmonary sarcoidosis, morbid obesity leading to obesity hypoventilation syndrome/obstructive sleep apnea, probable fibrotic effects in the lung due to prior radiation therapy for lung cancer. -Suspect main etiology at this time given her copious wheezing on lung auscultation is asthma/COPD exacerbation. -For now will place on IV steroids, antibiotics, Dulera, frequent albuterol nebs, continue Singulair. -There is no indication of acute or chronic diastolic heart failure at this time.  Nonetheless, will be judicious with IV fluid administration. -She had CT scan, albeit without contrast, earlier this week for follow-up of her lung  cancer which showed no acute changes.  Given her degree of wheezing, I do not believe we need to proceed with CT angiogram at this time to rule out PE, although this remains in the differential diagnosis especially if she does not improve rapidly with steroids and breathing treatments.  Hypertension -Well-controlled.  Hypokalemia -Replete orally, check magnesium levels.  Impaired glucose tolerance -Check A1c, hold off on sliding scale for now.  Stage III-IV chronic kidney disease -Baseline creatinine appears to be between 1.8 and 2.3. -Creatinine on admission is 2.14. -She follows with nephrology outpatient.  Morbid obesity -Discussed healthy lifestyle, including increased physical activity and better food choices to promote weight loss.  Chronic diastolic CHF -2D echo from 2/20 shows ejection fraction of 60 to 65% with normal cavity size. -Is currently compensated.    DVT prophylaxis: Subcutaneous Lovenox Code Status: Full code Family Communication: Patient only Disposition Plan: Admit inpatient MedSurg for treatment of COPD exacerbation, anticipate discharge home in 2 to 3 days. Consults called: None Admission status: Admit - It is my clinical opinion that admission to INPATIENT is reasonable and necessary because of the  expectation that this patient will require hospital care that crosses at least 2 midnights to treat this condition based on the medical complexity of the problems presented.  Given the aforementioned information, the predictability of an adverse outcome is felt to be significant.     Time Spent: 75 minutes.  Lelon Frohlich MD [facility] Pager [phone]  If 7PM-7AM, please contact night-coverage [email or web address] Password [facility]  12/17/2019, 2:39 PM

## 2019-12-17 NOTE — ED Provider Notes (Signed)
Medical screening examination/treatment/procedure(s) were conducted as a shared visit with non-physician practitioner(s) and myself.  I personally evaluated the patient during the encounter.  EKG Interpretation  Date/Time:  Sunday Dec 17 2019 09:34:44 EDT Ventricular Rate:  70 PR Interval:    QRS Duration: 116 QT Interval:  456 QTC Calculation: 493 R Axis:   -69 Text Interpretation: SR. baseline Artifact no ischemic changes. no sig change from previous Confirmed by Charlesetta Shanks [phone]) on 12/17/2019 9:47:51 AM  Patient has history of CHF and COPD. She reports gradual onset of increasing shortness of breath. This is especially worse with exertion over the past day. At baseline patient reported 91% oxygen saturation without history of using O2. EMS treated patient with albuterol and Solu-Medrol. On arrival nursing reports that patient was 82% on room air. She was placed on 5 L with return to high 90s.  Patient is alert and appropriate. No confusion. No respiratory distress at rest at this time. She does have extensive and coarse expiratory wheeze. Heart regular. Patient has lymphedema of the lower extremities. She reports this is chronic in appearance and not worse than usual.  At this time high suspicion for COPD exacerbation. Anticipate likely admission with COPD and hypoxia. I have titrated the patient down from 5 L to 2 L to observe response.   Charlesetta Shanks, MD 12/17/19 1052

## 2019-12-17 NOTE — Progress Notes (Signed)
Called to room by patient. Noted coughing and B/L upper Lobe Respiratory wheezing. Pt requested a Neb treatment. Pt remains on [place] O2 2L pox sat at 90%  Called RT for prn Neb treatment.

## 2019-12-18 ENCOUNTER — Encounter (HOSPITAL_COMMUNITY): Payer: Self-pay | Admitting: Internal Medicine

## 2019-12-18 DIAGNOSIS — I10 Essential (primary) hypertension: Secondary | ICD-10-CM

## 2019-12-18 DIAGNOSIS — E876 Hypokalemia: Secondary | ICD-10-CM

## 2019-12-18 DIAGNOSIS — D869 Sarcoidosis, unspecified: Secondary | ICD-10-CM

## 2019-12-18 DIAGNOSIS — I5032 Chronic diastolic (congestive) heart failure: Secondary | ICD-10-CM

## 2019-12-18 DIAGNOSIS — Z85118 Personal history of other malignant neoplasm of bronchus and lung: Secondary | ICD-10-CM

## 2019-12-18 DIAGNOSIS — J441 Chronic obstructive pulmonary disease with (acute) exacerbation: Principal | ICD-10-CM

## 2019-12-18 DIAGNOSIS — R7303 Prediabetes: Secondary | ICD-10-CM

## 2019-12-18 DIAGNOSIS — J9621 Acute and chronic respiratory failure with hypoxia: Secondary | ICD-10-CM

## 2019-12-18 LAB — CBC
HCT: 38.4 % (ref 36.0–46.0)
Hemoglobin: 11.7 g/dL — ABNORMAL LOW (ref 12.0–15.0)
MCH: 22 pg — ABNORMAL LOW (ref 26.0–34.0)
MCHC: 30.5 g/dL (ref 30.0–36.0)
MCV: 72.3 fL — ABNORMAL LOW (ref 80.0–100.0)
Platelets: 166 10*3/uL (ref 150–400)
RBC: 5.31 MIL/uL — ABNORMAL HIGH (ref 3.87–5.11)
RDW: 15.5 % (ref 11.5–15.5)
WBC: 6.8 10*3/uL (ref 4.0–10.5)
nRBC: 0 % (ref 0.0–0.2)

## 2019-12-18 LAB — BASIC METABOLIC PANEL
Anion gap: 12 (ref 5–15)
BUN: 63 mg/dL — ABNORMAL HIGH (ref 8–23)
CO2: 30 mmol/L (ref 22–32)
Calcium: 8.7 mg/dL — ABNORMAL LOW (ref 8.9–10.3)
Chloride: 100 mmol/L (ref 98–111)
Creatinine, Ser: 2.25 mg/dL — ABNORMAL HIGH (ref 0.44–1.00)
GFR calc Af Amer: 25 mL/min — ABNORMAL LOW (ref >60)
GFR calc non Af Amer: 22 mL/min — ABNORMAL LOW (ref >60)
Glucose, Bld: 159 mg/dL — ABNORMAL HIGH (ref 70–99)
Potassium: 4 mmol/L (ref 3.5–5.1)
Sodium: 142 mmol/L (ref 135–145)

## 2019-12-18 MED ORDER — METHYLPREDNISOLONE SODIUM SUCC 125 MG IJ SOLR
80.0000 mg | Freq: Two times a day (BID) | INTRAMUSCULAR | Status: DC
  Administered 2019-12-18 – 2019-12-20 (×4): 80 mg via INTRAVENOUS
  Filled 2019-12-18 (×4): qty 2

## 2019-12-18 NOTE — Progress Notes (Signed)
PROGRESS NOTE  Pamela [name] KVQ:[id number] DOB: March 27, 1951 DOA: 12/17/2019 PCP: Dorena Dew, FNP   LOS: 1 day   Brief narrative: As per HPI and the previous provider,  Pamela [name] is a 69 y.o. female with multiple medical comorbidities including chronic respiratory failure no longer on baseline oxygen therapy thought secondary to a combination of asthma/COPD and sarcoidosis with history of lung cancer that was diagnosed in 2012 and completed radiation therapy.  This appears to be in remission and is followed closely by oncology.  In fact, she just had a CAT scan on 5/21 that showed unchanged posttreatment appearance of the right lung with radiation fibrosis and consolidation of the right upper lobe that is chronic.  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 Patient stated that over the past 3 days she had noticed progressive increase in shortness of breath and weakness.  The weakness was reminiscent of when she previously had a stroke so she decided to come into the hospital today for evaluation.  Patient has completed her Covid vaccination series in April, she has had no sick contacts or recent travel.   In the emergency department, patient was found to be hypoxemic with sats of 82% on room air. Chest x-ray that again showed fibrosis that was chronic but no acute changes, lab work was unremarkable with the exception of mild hypokalemia, Covid PCR was negative.  Patient was then admitted to the hospital for further evaluation and treatment.  Assessment/Plan:  Principal Problem:   Acute on chronic respiratory failure with hypoxia (HCC) Active Problems:   Prediabetes   Essential hypertension   History of lung cancer   Morbid (severe) obesity due to excess calories (HCC)   Chronic diastolic CHF (congestive heart failure)  (HCC)   Sarcoidosis   Acute renal failure superimposed on stage 3 chronic kidney disease (HCC)   Hypokalemia   COPD with acute exacerbation (HCC)  Acute on chronic hypoxemic respiratory failure Likely multifactorial from underlying pulmonary sarcoidosis morbid obesity obesity hypoventilation syndrome obstructive sleep apnea, COPD exacerbation with possible fibrosis from prior radiation treatment for lung cancer.  At this time she has more wheezing and coughing so likely COPD exacerbation.  Patient has not been on oxygen for the last 3 years.  Continue IV steroids antibiotics frequent nebs and Singulair.  No evidence of acute heart failure decompensation.  CT scan was done this week without contrast without acute changes.  Currently on high-dose Solu-Medrol 80 every 8hrly will change to every 12H at this time.  Continue Lasix and Zaroxolyn.  Essential hypertension Stable at this time.  Close monitor  Hypokalemia Improved with replacement.  Magnesium 1.9.  Impaired glucose tolerance -Check A1c, hemoglobin of 6.2.  Stage III-IV chronic kidney disease -Baseline creatinine appears to be between 1.8 and 2.3. Creatinine on admission was 2.14.  Follows up with nephrology as outpatient.  Creatinine of 2.2 today.  We'll closely monitor  Morbid obesity Promoting weight loss with benefit the patient  Chronic diastolic CHF -2D echo from 2/20 showed ejection fraction of 60 to 65% with normal cavity size.   VTE Prophylaxis: Lovenox subcu  Code Status: Full code  Family Communication: None  Status is: Inpatient  Remains inpatient appropriate because:IV treatments appropriate due to intensity of illness or inability to take PO and Inpatient level of care appropriate due to severity of illness   Dispo: The patient  is from: Home              Anticipated d/c is to: Home              Anticipated d/c date is: 2 days              Patient currently is not medically stable to  d/c.   Consultants:  None  Procedures:  None  Antibiotics:  . Azithromycin  Anti-infectives (From admission, onward)   Start     Dose/Rate Route Frequency Ordered Stop   12/17/19 1800  azithromycin (ZITHROMAX) tablet 500 mg     500 mg Oral Daily 12/17/19 1525 12/22/19 0959       Subjective: Today, patient was seen and examined at bedside.  Complains of feeling little better with breathing but still with cough and wheezing.  Denies any chest pain fever or chills.  Objective: Vitals:   12/18/19 0800 12/18/19 0846  BP: 122/70   Pulse:    Resp:    Temp:    SpO2:  93%    Intake/Output Summary (Last 24 hours) at 12/18/2019 1233 Last data filed at 12/18/2019 0800 Gross per 24 hour  Intake 840 ml  Output 1400 ml  Net -560 ml   Filed Weights   12/17/19 1548  Weight: (!) 145.8 kg   Body mass index is 51.88 kg/m.   Physical Exam:  GENERAL: Patient is alert awake and oriented. Not in obvious distress.  Morbidly obese, on nasal cannula oxygen HENT: No scleral pallor or icterus. Pupils equally reactive to light. Oral mucosa is moist NECK: is supple, no gross swelling noted. CHEST: Mild bilateral expiratory wheezes, diminished breath sounds bilaterally. CVS: S1 and S2 heard, no murmur. Regular rate and rhythm.  ABDOMEN: Soft, non-tender, bowel sounds are present. EXTREMITIES: No edema. CNS: Cranial nerves are intact. No focal motor deficits. SKIN: warm and dry without rashes.  Discoloration of the lower extremity  Data Review: I have personally reviewed the following laboratory data and studies,  CBC: Recent Labs  Lab 12/17/19 1009 12/18/19 0441  WBC 8.9 6.8  NEUTROABS 6.1  --   HGB 12.4 11.7*  HCT 40.6 38.4  MCV 72.8* 72.3*  PLT 172 852   Basic Metabolic Panel: Recent Labs  Lab 12/17/19 1009 12/17/19 1604 12/18/19 0441  NA 141  --  142  K 3.1*  --  4.0  CL 96*  --  100  CO2 31  --  30  GLUCOSE 120*  --  159*  BUN 59*  --  63*  CREATININE 2.14*   --  2.25*  CALCIUM 8.9  --  8.7*  MG  --  1.9  --    Liver Function Tests: Recent Labs  Lab 12/17/19 1009  AST 24  ALT 15  ALKPHOS 144*  BILITOT 0.7  PROT 7.5  ALBUMIN 3.4*   No results for input(s): LIPASE, AMYLASE in the last 168 hours. No results for input(s): AMMONIA in the last 168 hours. Cardiac Enzymes: No results for input(s): CKTOTAL, CKMB, CKMBINDEX, TROPONINI in the last 168 hours. BNP (last 3 results) Recent Labs    01/02/19 1045 07/27/19 2049 12/17/19 1009  BNP 22.3 64.2 27.5    ProBNP (last 3 results) No results for input(s): PROBNP in the last 8760 hours.  CBG: No results for input(s): GLUCAP in the last 168 hours. Recent Results (from the past 240 hour(s))  SARS Coronavirus 2 by RT PCR (hospital order, performed in [facility] hospital lab)  Nasopharyngeal Nasopharyngeal Swab     Status: None   Collection Time: 12/17/19 10:10 AM   Specimen: Nasopharyngeal Swab  Result Value Ref Range Status   SARS Coronavirus 2 NEGATIVE NEGATIVE Final    Comment: (NOTE) SARS-CoV-2 target nucleic acids are NOT DETECTED. The SARS-CoV-2 RNA is generally detectable in upper and lower respiratory specimens during the acute phase of infection. The lowest concentration of SARS-CoV-2 viral copies this assay can detect is 250 copies / mL. A negative result does not preclude SARS-CoV-2 infection and should not be used as the sole basis for treatment or other patient management decisions.  A negative result may occur with improper specimen collection / handling, submission of specimen other than nasopharyngeal swab, presence of viral mutation(s) within the areas targeted by this assay, and inadequate number of viral copies (<250 copies / mL). A negative result must be combined with clinical observations, patient history, and epidemiological information. Fact Sheet for Patients:   StrictlyIdeas.no Fact Sheet for Healthcare  Providers: BankingDealers.co.za This test is not yet approved or cleared  by the [place] FDA and has been authorized for detection and/or diagnosis of SARS-CoV-2 by FDA under an Emergency Use Authorization (EUA).  This EUA will remain in effect (meaning this test can be used) for the duration of the COVID-19 declaration under Section 564(b)(1) of the Act, 21 U.S.C. section 360bbb-3(b)(1), unless the authorization is terminated or revoked sooner. Performed at [facility], [place] [address], [place], [place] [address]      Studies: DG Chest Port 1 View  Result Date: 12/17/2019 CLINICAL DATA:  Shortness of breath.  History of lung carcinoma EXAM: PORTABLE CHEST 1 VIEW COMPARISON:  Chest CT Dec 08, 2019; chest radiograph July 27, 2019 FINDINGS: Soft tissue prominence in the right perihilar region appears consistent with known radiation therapy change in this area. There is no edema or airspace opacity. Heart is upper normal in size with pulmonary vascularity normal. No adenopathy appreciable by portable radiographic examination. Loop recorder noted on the left. No bone lesions. IMPRESSION: Soft apparent radiation therapy change in the right perihilar region. No edema or airspace opacity. Stable cardiac silhouette. Loop recorder on the left. Electronically Signed   By: Lowella Grip III M.D.   On: 12/17/2019 10:06      Flora Lipps, MD  [facility] 12/18/2019

## 2019-12-18 NOTE — Progress Notes (Addendum)
Chaplains attempted to notarize AD - unable to find volunteers to witness signature.    AD still in need of notarization.

## 2019-12-18 NOTE — Progress Notes (Signed)
AD had been requested. Provided material and patient needs time to fill it out and will ask for chaplain when ready to complete it. Patient was glad to have chaplain to pray with her.   12/18/19 1100  Clinical Encounter Type  Visited With Patient  Visit Type  (provided AD packet and went over with patient.  )  Referral From Physician  Consult/Referral To Chaplain  Spiritual Encounters  Spiritual Needs Prayer  Stress Factors  Patient Stress Factors None identified  Family Stress Factors None identified  Conard Novak, Chaplain

## 2019-12-18 NOTE — Plan of Care (Signed)

## 2019-12-19 LAB — CBC
HCT: 38.4 % (ref 36.0–46.0)
Hemoglobin: 11.8 g/dL — ABNORMAL LOW (ref 12.0–15.0)
MCH: 22.2 pg — ABNORMAL LOW (ref 26.0–34.0)
MCHC: 30.7 g/dL (ref 30.0–36.0)
MCV: 72.3 fL — ABNORMAL LOW (ref 80.0–100.0)
Platelets: 182 10*3/uL (ref 150–400)
RBC: 5.31 MIL/uL — ABNORMAL HIGH (ref 3.87–5.11)
RDW: 15.8 % — ABNORMAL HIGH (ref 11.5–15.5)
WBC: 9.5 10*3/uL (ref 4.0–10.5)
nRBC: 0 % (ref 0.0–0.2)

## 2019-12-19 LAB — COMPREHENSIVE METABOLIC PANEL
ALT: 16 U/L (ref 0–44)
AST: 24 U/L (ref 15–41)
Albumin: 3.1 g/dL — ABNORMAL LOW (ref 3.5–5.0)
Alkaline Phosphatase: 112 U/L (ref 38–126)
Anion gap: 14 (ref 5–15)
BUN: 73 mg/dL — ABNORMAL HIGH (ref 8–23)
CO2: 27 mmol/L (ref 22–32)
Calcium: 8.6 mg/dL — ABNORMAL LOW (ref 8.9–10.3)
Chloride: 97 mmol/L — ABNORMAL LOW (ref 98–111)
Creatinine, Ser: 2.4 mg/dL — ABNORMAL HIGH (ref 0.44–1.00)
GFR calc Af Amer: 23 mL/min — ABNORMAL LOW (ref >60)
GFR calc non Af Amer: 20 mL/min — ABNORMAL LOW (ref >60)
Glucose, Bld: 135 mg/dL — ABNORMAL HIGH (ref 70–99)
Potassium: 3.7 mmol/L (ref 3.5–5.1)
Sodium: 138 mmol/L (ref 135–145)
Total Bilirubin: 0.5 mg/dL (ref 0.3–1.2)
Total Protein: 6.6 g/dL (ref 6.5–8.1)

## 2019-12-19 LAB — MAGNESIUM: Magnesium: 1.8 mg/dL (ref 1.7–2.4)

## 2019-12-19 LAB — PHOSPHORUS: Phosphorus: 4.2 mg/dL (ref 2.5–4.6)

## 2019-12-19 MED ORDER — GUAIFENESIN 100 MG/5ML PO SOLN
5.0000 mL | ORAL | Status: DC | PRN
  Administered 2019-12-19 – 2019-12-20 (×5): 100 mg via ORAL
  Filled 2019-12-19 (×5): qty 10

## 2019-12-19 MED ORDER — SIMETHICONE 80 MG PO CHEW
80.0000 mg | CHEWABLE_TABLET | Freq: Four times a day (QID) | ORAL | Status: DC | PRN
  Administered 2019-12-19: 80 mg via ORAL
  Filled 2019-12-19: qty 1

## 2019-12-19 NOTE — Progress Notes (Signed)
Patient ambulated to the restroom on room air. Oxygen saturation was 90-91% during activity. Will continue to monitor.

## 2019-12-19 NOTE — Progress Notes (Addendum)
Chaplain received call from RN regarding Adv. Directive.  Due to acuity / need in hospital, unable to staff this need at this time.  Will follow up with pt and RN for continued support around Adv. Directive.

## 2019-12-19 NOTE — Progress Notes (Signed)
PROGRESS NOTE  Pamela [name] NGE:[id number] DOB: 04/15/1951 DOA: 12/17/2019 PCP: Dorena Dew, FNP   LOS: 2 days   Brief narrative: As per HPI and the previous provider,  Pamela [name] is a 69 y.o. female with multiple medical comorbidities including chronic respiratory failure no longer on baseline oxygen therapy thought secondary to a combination of asthma/COPD and sarcoidosis with history of lung cancer that was diagnosed in 2012 and completed radiation therapy.  This appears to be in remission and is followed closely by oncology.  In fact, she just had a CAT scan on 5/21 that showed unchanged posttreatment appearance of the right lung with radiation fibrosis and consolidation of the right upper lobe that is chronic.  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 Patient stated that over the past 3 days she had noticed progressive increase in shortness of breath and weakness.  The weakness was reminiscent of when she previously had a stroke so she decided to come into the hospital today for evaluation.  Patient has completed her Covid vaccination series in April, she has had no sick contacts or recent travel.   In the emergency department, patient was found to be hypoxemic with sats of 82% on room air. Chest x-ray that again showed fibrosis that was chronic but no acute changes, lab work was unremarkable with the exception of mild hypokalemia, Covid PCR was negative.  Patient was then admitted to the hospital for further evaluation and treatment.  Assessment/Plan:  Principal Problem:   Acute on chronic respiratory failure with hypoxia (HCC) Active Problems:   Prediabetes   Essential hypertension   History of lung cancer   Morbid (severe) obesity due to excess calories (HCC)   Chronic diastolic CHF (congestive heart failure)  (HCC)   Sarcoidosis   Acute renal failure superimposed on stage 3 chronic kidney disease (HCC)   Hypokalemia   COPD with acute exacerbation (HCC)  Acute on chronic hypoxemic respiratory failure Likely multifactorial from underlying pulmonary sarcoidosis, morbid obesity, obesity hypoventilation syndrome, obstructive sleep apnea, COPD exacerbation with possible fibrosis from prior radiation treatment for lung cancer.  Feels little better today but not at baseline.  Still with wheezing and coughing so likely COPD exacerbation is dominant at this time..  Patient used to be on oxygen in the past has not been on oxygen for the last 3 years.  Continue IV steroids, antibiotics frequent nebs and Singulair.  No evidence of acute heart failure decompensation.  CT scan was done this week without contrast without acute changes.  Currently on soluMedrol 99 every 12H.  Will change to 40mg  every 12H.  Continue Lasix and Zaroxolyn.  Currently on 2 L of oxygen will continue to wean as able.  Essential hypertension Blood pressure seems to be stable at this time.  Close monitor  Hypokalemia Improved with replacement.  Potassium of 3.7..  Magnesium 1.8.  Impaired glucose tolerance Hemoglobin A1c of 6.2.  Stage IIIa chronic kidney disease -Baseline creatinine appears to be between 1.8 and 2.3. Creatinine on admission was 2.14.  Follows up with nephrology as outpatient.  Creatinine of 2.4 today.  We'll closely monitor  Morbid obesity Will benefit from weight loss outpatient.  Chronic diastolic CHF -2D echo from 2/20 showed ejection fraction of 60 to 65% with normal cavity size. On  Lasix oral 40 twice daily-Home dose.   VTE Prophylaxis: Lovenox subcu  Code  Status: Full code  Family Communication: None  Status is: Inpatient  Remains inpatient appropriate because:IV treatments appropriate due to intensity of illness or inability to take PO and Inpatient level of care appropriate due to severity of  illness, IV steroids for COPD exacerbation, on supplemental oxygen.   Dispo: The patient is from: Home              Anticipated d/c is to: Home              Anticipated d/c date is: 1 to 2 days.  Continue to wean oxygen as able.              Patient currently is not medically stable to d/c.   Consultants:  None  Procedures:  None  Antibiotics:  . Azithromycin  Anti-infectives (From admission, onward)   Start     Dose/Rate Route Frequency Ordered Stop   12/17/19 1800  azithromycin (ZITHROMAX) tablet 500 mg     500 mg Oral Daily 12/17/19 1525 12/22/19 0959       Subjective: Today, patient was seen and examined at bedside.  Complains of feeling little better with breathing but still with cough and wheezing.  Denies any chest pain fever or chills.  Objective: Vitals:   12/19/19 0801 12/19/19 1251  BP:  125/66  Pulse:  (!) 58  Resp:  19  Temp:  98 F (36.7 C)  SpO2: 96% 95%    Intake/Output Summary (Last 24 hours) at 12/19/2019 1523 Last data filed at 12/19/2019 0600 Gross per 24 hour  Intake 263 ml  Output 1350 ml  Net -1087 ml   Filed Weights   12/17/19 1548  Weight: (!) 145.8 kg   Body mass index is 51.88 kg/m.   Physical Exam:  GENERAL: Patient is alert awake and oriented. Not in obvious distress.  Morbidly obese, on nasal cannula oxygen HENT: No scleral pallor or icterus. Pupils equally reactive to light. Oral mucosa is moist NECK: is supple, no gross swelling noted. CHEST: Mild bilateral expiratory wheezes, diminished breath sounds bilaterally. CVS: S1 and S2 heard, no murmur. Regular rate and rhythm.  ABDOMEN: Soft, non-tender, bowel sounds are present. EXTREMITIES: No edema. CNS: Cranial nerves are intact. No focal motor deficits. SKIN: warm and dry without rashes.  Discoloration of the lower extremity  Data Review: I have personally reviewed the following laboratory data and studies,  CBC: Recent Labs  Lab 12/17/19 1009 12/18/19 0441  12/19/19 0428  WBC 8.9 6.8 9.5  NEUTROABS 6.1  --   --   HGB 12.4 11.7* 11.8*  HCT 40.6 38.4 38.4  MCV 72.8* 72.3* 72.3*  PLT 172 166 782   Basic Metabolic Panel: Recent Labs  Lab 12/17/19 1009 12/17/19 1604 12/18/19 0441 12/19/19 0428  NA 141  --  142 138  K 3.1*  --  4.0 3.7  CL 96*  --  100 97*  CO2 31  --  30 27  GLUCOSE 120*  --  159* 135*  BUN 59*  --  63* 73*  CREATININE 2.14*  --  2.25* 2.40*  CALCIUM 8.9  --  8.7* 8.6*  MG  --  1.9  --  1.8  PHOS  --   --   --  4.2   Liver Function Tests: Recent Labs  Lab 12/17/19 1009 12/19/19 0428  AST 24 24  ALT 15 16  ALKPHOS 144* 112  BILITOT 0.7 0.5  PROT 7.5 6.6  ALBUMIN 3.4* 3.1*   No  results for input(s): LIPASE, AMYLASE in the last 168 hours. No results for input(s): AMMONIA in the last 168 hours. Cardiac Enzymes: No results for input(s): CKTOTAL, CKMB, CKMBINDEX, TROPONINI in the last 168 hours. BNP (last 3 results) Recent Labs    01/02/19 1045 07/27/19 2049 12/17/19 1009  BNP 22.3 64.2 27.5    ProBNP (last 3 results) No results for input(s): PROBNP in the last 8760 hours.  CBG: No results for input(s): GLUCAP in the last 168 hours. Recent Results (from the past 240 hour(s))  SARS Coronavirus 2 by RT PCR (hospital order, performed in [facility] hospital lab) Nasopharyngeal Nasopharyngeal Swab     Status: None   Collection Time: 12/17/19 10:10 AM   Specimen: Nasopharyngeal Swab  Result Value Ref Range Status   SARS Coronavirus 2 NEGATIVE NEGATIVE Final    Comment: (NOTE) SARS-CoV-2 target nucleic acids are NOT DETECTED. The SARS-CoV-2 RNA is generally detectable in upper and lower respiratory specimens during the acute phase of infection. The lowest concentration of SARS-CoV-2 viral copies this assay can detect is 250 copies / mL. A negative result does not preclude SARS-CoV-2 infection and should not be used as the sole basis for treatment or other patient management decisions.  A negative  result may occur with improper specimen collection / handling, submission of specimen other than nasopharyngeal swab, presence of viral mutation(s) within the areas targeted by this assay, and inadequate number of viral copies (<250 copies / mL). A negative result must be combined with clinical observations, patient history, and epidemiological information. Fact Sheet for Patients:   StrictlyIdeas.no Fact Sheet for Healthcare Providers: BankingDealers.co.za This test is not yet approved or cleared  by the [place] FDA and has been authorized for detection and/or diagnosis of SARS-CoV-2 by FDA under an Emergency Use Authorization (EUA).  This EUA will remain in effect (meaning this test can be used) for the duration of the COVID-19 declaration under Section 564(b)(1) of the Act, 21 U.S.C. section 360bbb-3(b)(1), unless the authorization is terminated or revoked sooner. Performed at [facility], [place] [address], [place], [place] [address]      Studies: No results found.    Flora Lipps, MD  [facility] 12/19/2019

## 2019-12-20 ENCOUNTER — Other Ambulatory Visit: Payer: Self-pay

## 2019-12-20 DIAGNOSIS — N183 Chronic kidney disease, stage 3 unspecified: Secondary | ICD-10-CM

## 2019-12-20 DIAGNOSIS — D869 Sarcoidosis, unspecified: Secondary | ICD-10-CM

## 2019-12-20 DIAGNOSIS — N179 Acute kidney failure, unspecified: Secondary | ICD-10-CM

## 2019-12-20 LAB — BASIC METABOLIC PANEL
Anion gap: 15 (ref 5–15)
BUN: 91 mg/dL — ABNORMAL HIGH (ref 8–23)
CO2: 31 mmol/L (ref 22–32)
Calcium: 8.5 mg/dL — ABNORMAL LOW (ref 8.9–10.3)
Chloride: 91 mmol/L — ABNORMAL LOW (ref 98–111)
Creatinine, Ser: 2.39 mg/dL — ABNORMAL HIGH (ref 0.44–1.00)
GFR calc Af Amer: 24 mL/min — ABNORMAL LOW (ref >60)
GFR calc non Af Amer: 20 mL/min — ABNORMAL LOW (ref >60)
Glucose, Bld: 159 mg/dL — ABNORMAL HIGH (ref 70–99)
Potassium: 3.6 mmol/L (ref 3.5–5.1)
Sodium: 137 mmol/L (ref 135–145)

## 2019-12-20 LAB — CBC
HCT: 38.3 % (ref 36.0–46.0)
Hemoglobin: 12 g/dL (ref 12.0–15.0)
MCH: 21.9 pg — ABNORMAL LOW (ref 26.0–34.0)
MCHC: 31.3 g/dL (ref 30.0–36.0)
MCV: 70 fL — ABNORMAL LOW (ref 80.0–100.0)
Platelets: 171 10*3/uL (ref 150–400)
RBC: 5.47 MIL/uL — ABNORMAL HIGH (ref 3.87–5.11)
RDW: 15.4 % (ref 11.5–15.5)
WBC: 6.4 10*3/uL (ref 4.0–10.5)
nRBC: 0 % (ref 0.0–0.2)

## 2019-12-20 LAB — MAGNESIUM: Magnesium: 1.6 mg/dL — ABNORMAL LOW (ref 1.7–2.4)

## 2019-12-20 MED ORDER — MAGNESIUM OXIDE 400 (241.3 MG) MG PO TABS
400.0000 mg | ORAL_TABLET | Freq: Two times a day (BID) | ORAL | Status: DC
  Administered 2019-12-20 (×2): 400 mg via ORAL
  Filled 2019-12-20 (×2): qty 1

## 2019-12-20 MED ORDER — ALBUTEROL SULFATE (2.5 MG/3ML) 0.083% IN NEBU: 2.5000 mg | INHALATION_SOLUTION | Freq: Four times a day (QID) | RESPIRATORY_TRACT | 2 refills | Status: DC | PRN

## 2019-12-20 MED ORDER — PREDNISONE 10 MG (21) PO TBPK
ORAL_TABLET | ORAL | 0 refills | Status: DC
Start: 2019-12-20 — End: 2020-01-10

## 2019-12-20 MED ORDER — SIMETHICONE 80 MG PO CHEW: 80.0000 mg | CHEWABLE_TABLET | Freq: Four times a day (QID) | ORAL | 0 refills | Status: DC | PRN

## 2019-12-20 MED ORDER — MAGNESIUM SULFATE 2 GM/50ML IV SOLN
2.0000 g | Freq: Once | INTRAVENOUS | Status: AC
  Administered 2019-12-20: 2 g via INTRAVENOUS
  Filled 2019-12-20: qty 50

## 2019-12-20 MED ORDER — PANTOPRAZOLE SODIUM 40 MG PO TBEC
40.0000 mg | DELAYED_RELEASE_TABLET | Freq: Every day | ORAL | 0 refills | Status: DC
Start: 2019-12-20 — End: 2020-04-01

## 2019-12-20 MED ORDER — MAGNESIUM OXIDE 400 (241.3 MG) MG PO TABS: 400.0000 mg | ORAL_TABLET | Freq: Every day | ORAL | 0 refills | Status: DC

## 2019-12-20 NOTE — Progress Notes (Signed)
No change from am assessment. Pt A&Ox4, ambulatory. Discharge instructions reviewed, questions, concerns denied at this time. SW contacted to assist with transport.

## 2019-12-20 NOTE — Consult Note (Addendum)
   [facility] CM Inpatient Consult   12/20/2019  Pamela [name] [birth year] [id number]  Patient screened for potential [facility] Care Management services due to unplanned readmission risk score of, 28%. Pamela [name] had been contacted by [facility] CM social worker earlier this year to assess need for meal assistance.  Spoke with patient by telephone. HIPAA verified. Reviewed [facility] CM services with her and explained that the call was to make an assessment for possible community needs. Pamela [name] states that she uses [employer] for transportation services and denies having problems affording medications at this time. Patient agrees to receiving a post hospital follow-up call from [place] RN community care manager to assist with chronic disease management. Referral to [place] community RN will be placed for community follow up.  Of note, [facility] Care Management services does not replace or interfere with any services that are arranged by inpatient case management or social work.  Netta Cedars, MSN, [facility] Liaison Nurse Mobile Phone [phone]  Toll free office [phone]

## 2019-12-20 NOTE — Care Management Important Message (Signed)
Important Message  Patient Details  Name: Pamela [name] MRN: [id number] Date of Birth: 1951/01/18   Medicare Important Message Given:     Given to [name] for the patient to sign    Crista Luria 12/20/2019, 10:32 AM

## 2019-12-20 NOTE — Discharge Instructions (Signed)
COPD and Physical Activity Chronic obstructive pulmonary disease (COPD) is a long-term (chronic) condition that affects the lungs. COPD is a general term that can be used to describe many different lung problems that cause lung swelling (inflammation) and limit airflow, including chronic bronchitis and emphysema. The main symptom of COPD is shortness of breath, which makes it harder to do even simple tasks. This can also make it harder to exercise and be active. Talk with your health care provider about treatments to help you breathe better and actions you can take to prevent breathing problems during physical activity. What are the benefits of exercising with COPD? Exercising regularly is an important part of a healthy lifestyle. You can still exercise and do physical activities even though you have COPD. Exercise and physical activity improve your shortness of breath by increasing blood flow (circulation). This causes your heart to pump more oxygen through your body. Moderate exercise can improve your:  Oxygen use.  Energy level.  Shortness of breath.  Strength in your breathing muscles.  Heart health.  Sleep.  Self-esteem and feelings of self-worth.  Depression, stress, and anxiety levels. Exercise can benefit everyone with COPD. The severity of your disease may affect how hard you can exercise, especially at first, but everyone can benefit. Talk with your health care provider about how much exercise is safe for you, and which activities and exercises are safe for you. What actions can I take to prevent breathing problems during physical activity?  Sign up for a pulmonary rehabilitation program. This type of program may include: ? Education about lung diseases. ? Exercise classes that teach you how to exercise and be more active while improving your breathing. This usually involves:  Exercise using your lower extremities, such as a stationary bicycle.  About 30 minutes of exercise, 2  to 5 times per week, for 6 to 12 weeks  Strength training, such as push ups or leg lifts. ? Nutrition education. ? Group classes in which you can talk with others who also have COPD and learn ways to manage stress.  If you use an oxygen tank, you should use it while you exercise. Work with your health care provider to adjust your oxygen for your physical activity. Your resting flow rate is different from your flow rate during physical activity.  While you are exercising: ? Take slow breaths. ? Pace yourself and do not try to go too fast. ? Purse your lips while breathing out. Pursing your lips is similar to a kissing or whistling position. ? If doing exercise that uses a quick burst of effort, such as weight lifting:  Breathe in before starting the exercise.  Breathe out during the hardest part of the exercise (such as raising the weights). Where to find support You can find support for exercising with COPD from:  Your health care provider.  A pulmonary rehabilitation program.  Your local health department or community health programs.  Support groups, online or in-person. Your health care provider may be able to recommend support groups. Where to find more information You can find more information about exercising with COPD from:  American Lung Association: ClassInsider.se.  COPD Foundation: https://www.rivera.net/. Contact a health care provider if:  Your symptoms get worse.  You have chest pain.  You have nausea.  You have a fever.  You have trouble talking or catching your breath.  You want to start a new exercise program or a new activity. Summary  COPD is a general term that can  be used to describe many different lung problems that cause lung swelling (inflammation) and limit airflow. This includes chronic bronchitis and emphysema.  Exercise and physical activity improve your shortness of breath by increasing blood flow (circulation). This causes your heart to provide more  oxygen to your body.  Contact your health care provider before starting any exercise program or new activity. Ask your health care provider what exercises and activities are safe for you. This information is not intended to replace advice given to you by your health care provider. Make sure you discuss any questions you have with your health care provider. Document Revised: 10/26/2018 Document Reviewed: 07/29/2017 Elsevier Patient Education  2020 [place] Warning Signs of a Stroke  A stroke is a medical emergency and should be treated right away--every second counts. A stroke is caused by a decrease or block in blood flow to the brain. When this occurs, certain areas of the brain do not get enough oxygen, and brain cells begin to die. A stroke can lead to brain damage and can sometimes be life-threatening. However, if someone having a stroke gets medical treatment right away, he or she has better chances of surviving and recovering from the stroke. Being able to recognize the symptoms of a stroke is very important. Types of strokes There are two main types of strokes:  Ischemic strokes. This is the most common type of stroke. These strokes happen when a blood vessel that supplies blood to the brain is being blocked.  Hemorrhagic strokes. These strokes result from bleeding in the brain due to a blood vessel leaking or bursting (rupturing). A transient ischemic attack (TIA) is a "warning stroke" that causes stroke-like symptoms that go away quickly. Unlike a stroke, a TIA does not cause permanent damage to the brain. However, the symptoms of a TIA are the same as a stroke, and they also require medical treatment right away. Having a TIA is a sign that you are at higher risk for a permanent stroke. Warning signs of a stroke The symptoms of stroke may vary and will reflect the part of the brain that is involved. Symptoms usually happen suddenly. "BE FAST" is an easy way to remember the main  warning signs of a stroke. B - Balance Signs are dizziness, sudden trouble walking, or loss of balance. E - Eyes Signs are trouble seeing or a sudden change in vision. F - Face Signs are sudden weakness or numbness of the face, or the face or eyelid drooping on one side. A - Arms Signs are weakness or numbness in an arm. This happens suddenly and usually on one side of the body. S - Speech Signs are sudden trouble speaking, slurred speech, or trouble understanding what people say. T - Time Time to call emergency services. Write down what time symptoms started. Other signs of a stroke Some less common signs of a stroke include:  A sudden, severe headache with no known cause.  Nausea or vomiting.  Seizure. A stroke may be happening even if only one "BE FAST" symptoms is present. These symptoms may represent a serious problem that is an emergency. Do not wait to see if the symptoms will go away. Get medical help right away. Call your local emergency services (911 in the [place] Do not drive yourself to the hospital. Summary  A stroke is a medical emergency and should be treated right away--every second counts.  "BE FAST" is an easy way to remember the main warning signs of a  stroke.  Call local emergency services right away if you or someone else has any stroke symptoms, even if the symptoms go away.  Make note of what time the first symptoms appeared. Emergency responders or emergency room staff will need to know this information.  Do not wait to see if symptoms will go away. Call 911 even if only one of the "BE FAST" symptoms appears. This information is not intended to replace advice given to you by your health care provider. Make sure you discuss any questions you have with your health care provider. Document Revised: 06/18/2017 Document Reviewed: 10/23/2016 Elsevier Patient Education  Pamela [name].

## 2019-12-20 NOTE — Clinical Social Work Note (Signed)
CSW was informed that patient needs EMS transport home, CSW completed Med Necessity form and scheduled EMS.  Patient is planning to discharge back home.  Patient did not have any other needs or concerns.  Pamela [name]. Marieclaire Bettenhausen, MSW, [place]  12/20/2019 3:37 PM

## 2019-12-20 NOTE — Discharge Summary (Signed)
Physician Discharge Summary  [name] EYC:[id number] DOB: 04-11-1951 DOA: 12/17/2019  PCP: Dorena Dew, FNP  Admit date: 12/17/2019 Discharge date: 12/20/2019  Admitted From: Home  Discharge disposition: Home   Recommendations for Outpatient Follow-Up:   Follow up with your primary care provider in one week.  Check CBC, BMP, magnesium in the next visit Follow-up with Dr. Melvyn Novas [facility] in 1 to 2 weeks.  Discharge Diagnosis:   Principal Problem:   Acute on chronic respiratory failure with hypoxia (HCC) Active Problems:   Prediabetes   Essential hypertension   History of lung cancer   Morbid (severe) obesity due to excess calories (HCC)   Chronic diastolic CHF (congestive heart failure) (HCC)   Sarcoidosis   Acute renal failure superimposed on stage 3 chronic kidney disease (HCC)   Hypokalemia   COPD with acute exacerbation ([place])    Discharge Condition: Improved.  Diet recommendation: Low sodium, heart healthy.    Wound care: None.  Code status: Full.   History of Present Illness:   Pamela [name] is a 69 y.o. female with multiple medical comorbidities including chronic respiratory failure no longer on baseline oxygen therapy thought secondary to a combination of asthma/COPD and sarcoidosis with history of lung cancer that was diagnosed in 2012 and completed radiation therapy.  This appears to be in remission and is followed closely by oncology.  In fact, she just had a CAT scan on 5/21 that showed unchanged posttreatment appearance of the right lung with radiation fibrosis and consolidation of the right upper lobe that is chronic.  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 Patient stated that over 3 days  she had congestion with increase in shortness of breath and weakness.  The weakness was  reminiscent of when she previously had a stroke so she decided to come into the hospital today for evaluation.  Patient has completed her Covid vaccination series in April, she has had no sick contacts or recent travel.   In the emergency department, patient was found to be hypoxemic with sats of 82% on room air. Chest x-ray that again showed fibrosis that was chronic but no acute changes, lab work was unremarkable with the exception of mild hypokalemia, Covid PCR was negative.  Patient was then admitted to the hospital for further evaluation and treatment.   Hospital Course:   Following conditions were addressed during hospitalization as listed below,  Acute on history of chronic hypoxemic respiratory failure Likely multifactorial from underlying pulmonary sarcoidosis, morbid obesity, obesity hypoventilation syndrome, obstructive sleep apnea, COPD exacerbation with possible fibrosis from prior radiation treatment for lung cancer.  Patient was treated for COPD exacerbation with frequent nebulizer and IV steroids with significant improvement in her symptoms.  Patient was gradually weaned off the oxygen during hospitalization.   No evidence of acute heart failure decompensation.  CT scan was done this week without contrast was without acute changes.  Continue Lasix and Zaroxolyn on discharge.   Essential hypertension Patient will resume home medications on discharge.   Hypokalemia Improved with replacement.  Potassium of 3.7 prior to discharge..  Magnesium 1.6.  Will be given magnesium supplements on discharge   Impaired glucose tolerance Hemoglobin A1c of 6.2.  Lifestyle modification to be emphasized.   Stage IIIa chronic kidney disease -Baseline creatinine appears to be between 1.8 and 2.3. Creatinine on admission was 2.14.  Follows up with  nephrology as outpatient.  Creatinine of 2.3 today.  Will need outpatient monitoring with BMP   Morbid obesity Will benefit from weight loss discussion as  outpatient.   Chronic diastolic CHF -2D echo from 2/20 showed ejection fraction of 60 to 65% with normal cavity size. On  Lasix oral 40 twice daily and metolazone-Home dose.  This will be continued on discharge  Disposition.  At this time, patient is stable for disposition home.  Patient will need to follow-up with her primary care provider and pulmonary clinic on discharge.  Medical Consultants:   None.  Procedures:    None Subjective:   Today, patient was seen and examined at bedside.  Feels much better today.  No wheezing or shortness of breath.  Patient has been off oxygen since yesterday.  Discharge Exam:   Vitals:   12/20/19 0712 12/20/19 0914  BP: 108/75   Pulse: 62   Resp:    Temp: 98.2 F (36.8 C)   SpO2: 100% 94%   Vitals:   12/19/19 1930 12/19/19 2130 12/20/19 0712 12/20/19 0914  BP:  (!) 117/59 108/75   Pulse:  62 62   Resp:  20    Temp:  98 F (36.7 C) 98.2 F (36.8 C)   TempSrc:  Oral Oral   SpO2: 94% 96% 100% 94%  Weight:      Height:       General: Alert awake, not in obvious distress, morbidly obese.   HENT: pupils equally reacting to light,  No scleral pallor or icterus noted. Oral mucosa is moist.  Chest:  Diminished breath sounds bilaterally.  No wheezes today..  CVS: S1 &S2 heard. No murmur.  Regular rate and rhythm. Abdomen: Soft, nontender, nondistended.  Bowel sounds are heard.   Extremities: No cyanosis, clubbing or edema.  Peripheral pulses are palpable. Psych: Alert, awake and oriented, normal mood CNS:  No cranial nerve deficits.  Power equal in all extremities.   Skin: Warm and dry, chronic venous insufficiency in the lower extremity with hyperpigmentation.  The results of significant diagnostics from this hospitalization (including imaging, microbiology, ancillary and laboratory) are listed below for reference.     Diagnostic Studies:   DG Chest Port 1 View  Result Date: 12/17/2019 CLINICAL DATA:  Shortness of breath.  History  of lung carcinoma EXAM: PORTABLE CHEST 1 VIEW COMPARISON:  Chest CT Dec 08, 2019; chest radiograph July 27, 2019 FINDINGS: Soft tissue prominence in the right perihilar region appears consistent with known radiation therapy change in this area. There is no edema or airspace opacity. Heart is upper normal in size with pulmonary vascularity normal. No adenopathy appreciable by portable radiographic examination. Loop recorder noted on the left. No bone lesions. IMPRESSION: Soft apparent radiation therapy change in the right perihilar region. No edema or airspace opacity. Stable cardiac silhouette. Loop recorder on the left. Electronically Signed   By: Lowella Grip III M.D.   On: 12/17/2019 10:06     Labs:   Basic Metabolic Panel: Recent Labs  Lab 12/17/19 1009 12/17/19 1009 12/17/19 1604 12/18/19 0441 12/18/19 0441 12/19/19 0428 12/20/19 0526  NA 141  --   --  142  --  138 137  K 3.1*   < >  --  4.0   < > 3.7 3.6  CL 96*  --   --  100  --  97* 91*  CO2 31  --   --  30  --  27 31  GLUCOSE 120*  --   --  159*  --  135* 159*  BUN 59*  --   --  63*  --  73* 91*  CREATININE 2.14*  --   --  2.25*  --  2.40* 2.39*  CALCIUM 8.9  --   --  8.7*  --  8.6* 8.5*  MG  --   --  1.9  --   --  1.8 1.6*  PHOS  --   --   --   --   --  4.2  --    < > = values in this interval not displayed.   GFR Estimated Creatinine Clearance: 33.9 mL/min (A) (by C-G formula based on SCr of 2.39 mg/dL (H)). Liver Function Tests: Recent Labs  Lab 12/17/19 1009 12/19/19 0428  AST 24 24  ALT 15 16  ALKPHOS 144* 112  BILITOT 0.7 0.5  PROT 7.5 6.6  ALBUMIN 3.4* 3.1*   No results for input(s): LIPASE, AMYLASE in the last 168 hours. No results for input(s): AMMONIA in the last 168 hours. Coagulation profile No results for input(s): INR, PROTIME in the last 168 hours.  CBC: Recent Labs  Lab 12/17/19 1009 12/18/19 0441 12/19/19 0428 12/20/19 0526  WBC 8.9 6.8 9.5 6.4  NEUTROABS 6.1  --   --   --   HGB  12.4 11.7* 11.8* 12.0  HCT 40.6 38.4 38.4 38.3  MCV 72.8* 72.3* 72.3* 70.0*  PLT 172 166 182 171   Cardiac Enzymes: No results for input(s): CKTOTAL, CKMB, CKMBINDEX, TROPONINI in the last 168 hours. BNP: Invalid input(s): POCBNP CBG: No results for input(s): GLUCAP in the last 168 hours. D-Dimer No results for input(s): DDIMER in the last 72 hours. Hgb A1c Recent Labs    12/17/19 1604  HGBA1C 6.2*   Lipid Profile No results for input(s): CHOL, HDL, LDLCALC, TRIG, CHOLHDL, LDLDIRECT in the last 72 hours. Thyroid function studies No results for input(s): TSH, T4TOTAL, T3FREE, THYROIDAB in the last 72 hours.  Invalid input(s): FREET3 Anemia work up No results for input(s): VITAMINB12, FOLATE, FERRITIN, TIBC, IRON, RETICCTPCT in the last 72 hours. Microbiology Recent Results (from the past 240 hour(s))  SARS Coronavirus 2 by RT PCR (hospital order, performed in [facility] hospital lab) Nasopharyngeal Nasopharyngeal Swab     Status: None   Collection Time: 12/17/19 10:10 AM   Specimen: Nasopharyngeal Swab  Result Value Ref Range Status   SARS Coronavirus 2 NEGATIVE NEGATIVE Final    Comment: (NOTE) SARS-CoV-2 target nucleic acids are NOT DETECTED. The SARS-CoV-2 RNA is generally detectable in upper and lower respiratory specimens during the acute phase of infection. The lowest concentration of SARS-CoV-2 viral copies this assay can detect is 250 copies / mL. A negative result does not preclude SARS-CoV-2 infection and should not be used as the sole basis for treatment or other patient management decisions.  A negative result may occur with improper specimen collection / handling, submission of specimen other than nasopharyngeal swab, presence of viral mutation(s) within the areas targeted by this assay, and inadequate number of viral copies (<250 copies / mL). A negative result must be combined with clinical observations, patient history, and epidemiological  information. Fact Sheet for Patients:   StrictlyIdeas.no Fact Sheet for Healthcare Providers: BankingDealers.co.za This test is not yet approved or cleared  by the [place] FDA and has been authorized for detection and/or diagnosis of SARS-CoV-2 by FDA under an Emergency Use Authorization (EUA).  This EUA will remain in effect (meaning this test can be used)  for the duration of the COVID-19 declaration under Section 564(b)(1) of the Act, 21 U.S.C. section 360bbb-3(b)(1), unless the authorization is terminated or revoked sooner. Performed at [facility], [place] [address], [place], [place] [address]      Discharge Instructions:   Discharge Instructions     Diet - low sodium heart healthy   Complete by: As directed    Discharge instructions   Complete by: As directed    Follow-up with your primary care physician in 1 week.  Follow-up with Dr. Melvyn Novas pulmonary physician in 1 to 2 weeks.  Take Prednisone as prescribed.  Continue to nebulizer at home.  No overexertion.   Increase activity slowly   Complete by: As directed       Allergies as of 12/20/2019       Reactions   Sulfa Antibiotics Hives, Rash        Medication List     TAKE these medications    acetaminophen 500 MG tablet Commonly known as: TYLENOL Take 1,000 mg by mouth every 6 (six) hours as needed for mild pain.   albuterol (2.5 MG/3ML) 0.083% nebulizer solution Commonly known as: PROVENTIL Take 3 mLs (2.5 mg total) by nebulization every 6 (six) hours as needed for wheezing or shortness of breath.   albuterol 108 (90 Base) MCG/ACT inhaler Commonly known as: ProAir HFA Inhale 2 puffs into the lungs every 6 (six) hours as needed for wheezing or shortness of breath.   aspirin 325 MG tablet Take 1 tablet (325 mg total) by mouth daily.   atorvastatin 40 MG tablet Commonly known as: LIPITOR Take 1 tablet (40 mg total) by mouth daily at 6  PM.   budesonide-formoterol 160-4.5 MCG/ACT inhaler Commonly known as: Symbicort Inhale 2 puffs into the lungs 2 (two) times daily.   calcitRIOL 0.25 MCG capsule Commonly known as: ROCALTROL Take 1 capsule (0.25 mcg total) by mouth daily.   calcium carbonate 500 MG chewable tablet Commonly known as: TUMS - dosed in mg elemental calcium Chew 1-2 tablets by mouth daily as needed for indigestion (gas).   dextromethorphan 30 MG/5ML liquid Commonly known as: DELSYM Take by mouth as needed for cough.   docusate sodium 100 MG capsule Commonly known as: COLACE Take 1 capsule (100 mg total) by mouth 2 (two) times daily.   febuxostat 40 MG tablet Commonly known as: Uloric Take 1 tablet (40 mg total) by mouth daily.   furosemide 40 MG tablet Commonly known as: LASIX Take 1 tablet (40 mg total) by mouth 2 (two) times daily. For CHF   magnesium oxide 400 (241.3 Mg) MG tablet Commonly known as: MAG-OX Take 1 tablet (400 mg total) by mouth daily.   metolazone 2.5 MG tablet Commonly known as: ZAROXOLYN Take 2.5 mg by mouth 2 (two) times a week.   montelukast 10 MG tablet Commonly known as: SINGULAIR Take 1 tablet (10 mg total) by mouth at bedtime.   pantoprazole 40 MG tablet Commonly known as: Protonix Take 1 tablet (40 mg total) by mouth daily.   predniSONE 10 MG (21) Tbpk tablet Commonly known as: STERAPRED UNI-PAK 21 TAB 3 tab orally daily x 2 days then 2 tab po daily x 2 days then 1 tab po daily x 2 days   simethicone 80 MG chewable tablet Commonly known as: MYLICON Chew 1 tablet (80 mg total) by mouth 4 (four) times daily as needed for flatulence.           Time coordinating discharge:  39 minutes  Signed:  Breaunna Gottlieb  [facility] 12/20/2019, 9:47 AM

## 2019-12-20 NOTE — Progress Notes (Signed)
Pt was scheduled for discharge via PTAR during day shift. Pt called son around 105 and son called [facility] for pt. This RN verified with [facility] this was appropriate and then cancelled PTAR service. Pt is A&Ox4 and ambulatory. Pt was taken down in wheelchair to [facility] vehicle with all of her belongings.

## 2019-12-20 NOTE — Progress Notes (Signed)
Oxygen saturation assessed at rest on room air 93%. Patient out of bed self with use of cane. Oxygen saturation on ra while ambulating sustained 92%. Pt has some dyspnea with exertion but reports feeling better over all. Will continue to encourage deep breathing exercises.

## 2019-12-20 NOTE — Progress Notes (Signed)
This Chaplain and [place] assisting pt in completing Advance Directive.

## 2019-12-22 ENCOUNTER — Other Ambulatory Visit: Payer: Self-pay | Admitting: *Deleted

## 2019-12-22 ENCOUNTER — Encounter: Payer: Self-pay | Admitting: *Deleted

## 2019-12-22 NOTE — Patient Outreach (Signed)
[facility]) Care Management [facility] CM Telephone Outreach PCP office completes Transition of Care follow up post-hospital discharge Post-hospital discharge day # 2 12/22/2019  Maleni Seyer 1950-12-24 [id number]  Successful telephone outreach to Rolland Bimler, 69 y/o female referred to [facility] RN CM 12/20/19 by [facility] liaison after recent hospitalization May 30-December 20, 2019 for shortness of breath, COPD exacerbation, and acute on chronic respiratory failure.  Patient was discharged home to self care.  Patient has history including, but not limited to, morbid obesity; HTN; CHF; COPD- on home O2; asthma; previous CVA; CKD- III; lung cancer; and pre-diabetes.  HIPAA/ identity verified with patient and [facility] CM services discussed with patient; she reports today that she was visited by [facility] team yesterday after after her hospital discharge and confirms that [place] team is active in her care and will be visiting her at home regularly; she reports she "does not need both" services; patient assures me she is "doing fine," and has a scheduled post-hospital discharge PCP appointment next week and plans to use established transportation services.  Patient states she has another call occurring on her home phone at the time of our call, and she politely declines [facility] CM services stating she feels like "everything is in place" with [facility] team involvement.  Explained to patient that I would place [facility] CM letter in mail to her, should she wish to participate in [facility] CM program in the future- she is agreeable to this.  Plan:  Will update patient's referred [facility] RN CM on outcome of today's call with patient  Will make patient inactive with [facility] CM as she is currently active with [facility] team and declines additional CM services, and will make patient's PCP aware of same  Oneta Rack, RN, BSN, [place] Coordinator [facility] Care Management  [phone]

## 2019-12-26 ENCOUNTER — Encounter: Payer: Self-pay | Admitting: Family Medicine

## 2019-12-26 ENCOUNTER — Ambulatory Visit (INDEPENDENT_AMBULATORY_CARE_PROVIDER_SITE_OTHER): Payer: Medicare Other | Admitting: Family Medicine

## 2019-12-26 ENCOUNTER — Other Ambulatory Visit: Payer: Self-pay

## 2019-12-26 VITALS — BP 103/59 | HR 67 | Temp 97.0°F | Ht 66.0 in | Wt 320.0 lb

## 2019-12-26 DIAGNOSIS — R82998 Other abnormal findings in urine: Secondary | ICD-10-CM | POA: Diagnosis not present

## 2019-12-26 DIAGNOSIS — Z8709 Personal history of other diseases of the respiratory system: Secondary | ICD-10-CM | POA: Diagnosis not present

## 2019-12-26 DIAGNOSIS — I639 Cerebral infarction, unspecified: Secondary | ICD-10-CM | POA: Diagnosis not present

## 2019-12-26 DIAGNOSIS — I10 Essential (primary) hypertension: Secondary | ICD-10-CM | POA: Diagnosis not present

## 2019-12-26 DIAGNOSIS — N183 Chronic kidney disease, stage 3 unspecified: Secondary | ICD-10-CM

## 2019-12-26 DIAGNOSIS — D869 Sarcoidosis, unspecified: Secondary | ICD-10-CM

## 2019-12-26 DIAGNOSIS — Z23 Encounter for immunization: Secondary | ICD-10-CM | POA: Diagnosis not present

## 2019-12-26 DIAGNOSIS — G4733 Obstructive sleep apnea (adult) (pediatric): Secondary | ICD-10-CM

## 2019-12-26 DIAGNOSIS — Z6841 Body Mass Index (BMI) 40.0 and over, adult: Secondary | ICD-10-CM

## 2019-12-26 LAB — POCT URINALYSIS DIPSTICK
Bilirubin, UA: NEGATIVE
Blood, UA: NEGATIVE
Glucose, UA: NEGATIVE
Ketones, UA: NEGATIVE
Nitrite, UA: NEGATIVE
Protein, UA: NEGATIVE
Spec Grav, UA: 1.015 (ref 1.010–1.025)
Urobilinogen, UA: 0.2 E.U./dL
pH, UA: 5.5 (ref 5.0–8.0)

## 2019-12-26 NOTE — Progress Notes (Signed)
[facility]   Established Patient Office Visit  Subjective:  Patient ID: Pamela [name], female    DOB: July 15, 1951  Age: 69 y.o. MRN: [id number]  CC:  Chief Complaint  Patient presents with  . Hospitalization Follow-up    hospital follow up from 12/21/2019     HPI Kadedra Vanaken is a 69 year old female with a medical history significant for essential hypertension, history of lung adenocarcinomas, history of CHF with preserved EF, chronic kidney disease, COPD, gout, sarcoidosis, and OSA presents for a post hospital follow-up.  Patient was admitted on 12/17/2018 with shortness of breath. Patient has a history of lung cancer that was diagnosed in 2012 and completed radiation therapy.  It appears to be a remission and is followed closely by oncology.  She last had a CT scan on 521 that showed unchanged posttreatment appearance of the right lung with radiation fibrosis and consolidation of right upper lobe that appears to be chronic.  Past medical history also significant for morbid obesity, hypertension, and impaired glucose tolerance.  Patient also has stage III chronic kidney disease and is followed by nephrology.  Prior to admission, patient had noticed 3 days of progressively worsening shortness of breath and weakness.  She is she says that the weakness was reminiscent of when she previously had a stroke, so she decided to report to the emergency department for further evaluation.  At that time, patient had no upper respiratory symptoms.  She is negative for COVID-19.  Patient's oxygen saturation was 82% on RA in the emergency department.  Her chest x-ray showed fibrosis that is chronic, no acute changes.  Patient remained afebrile throughout admission.  Patient was managed with still notes and is currently completing a Medrol Dosepak.  Patient was continued on scheduled inhalers.  She states that all symptoms have improved.  She has not required home  oxygen.  Oxygen saturation has remained above 90% since discharge. She denies shortness of breath, headache, chest pain, blurred vision, urinary symptoms, nausea, vomiting, or diarrhea.  Past Medical History:  Diagnosis Date  . Arthritis   . Asthma   . Cancer ([place])    lung, adenocarcinoma right lung 2012  . CHF (congestive heart failure) (HCC)    Preserved EF  . Congenital single kidney    With chronic kidney disease  . COPD (chronic obstructive pulmonary disease) ([place])   . Gout   . Hypertension   . Lung cancer [facility]) 2012   Right upper lobe lung adenocarcinoma diagnosed with needle biopsy treated by SBRT finished treatment April 2013 has been monitored since  . Mass of chest wall, right    Right chest wall mass 7.3 cm biopsy on 12/13/2013. Patient notes it was consistent with sarcoidosis but actual pathology results not available.  . Oxygen deficiency   . Sarcoidosis   . Sickle cell trait ([place])   . Sleep apnea     Past Surgical History:  Procedure Laterality Date  . LOOP RECORDER INSERTION N/A 09/08/2018   Procedure: LOOP RECORDER INSERTION;  Surgeon: Evans Lance, MD;  Location: [facility];  Service: Cardiovascular;  Laterality: N/A;  . LUNG BIOPSY    . TEE WITHOUT CARDIOVERSION N/A 09/08/2018   Procedure: TRANSESOPHAGEAL ECHOCARDIOGRAM (TEE);  Surgeon: Josue Hector, MD;  Location: [facility] ENDOSCOPY;  Service: Cardiovascular;  Laterality: N/A;  with loop  . TUBAL LIGATION    . VEIN LIGATION AND STRIPPING      Family History  Problem  Relation Age of Onset  . Hypertension Mother   . Renal Disease Mother   . Cancer Father        stomach  . Heart disease Father        No details  . Cancer Brother   . Diabetes Brother   . Cervical cancer Sister   . Diabetes Sister   . Multiple myeloma Sister     Social History   Socioeconomic History  . Marital status: Divorced    Spouse name: Not on file  . Number of children: 2  . Years of education: Not on file  .  Highest education level: Not on file  Occupational History  . Not on file  Tobacco Use  . Smoking status: Never Smoker  . Smokeless tobacco: Never Used  Substance and Sexual Activity  . Alcohol use: No    Alcohol/week: 0.0 standard drinks  . Drug use: No  . Sexual activity: Not Currently  Other Topics Concern  . Not on file  Social History Narrative   Lives with son.  One son is deceased.     Social Determinants of Health   Financial Resource Strain:   . Difficulty of Paying Living Expenses:   Food Insecurity:   . Worried About Charity fundraiser in the Last Year:   . Arboriculturist in the Last Year:   Transportation Needs:   . Film/video editor (Medical):   [name] Kitchen Lack of Transportation (Non-Medical):   Physical Activity:   . Days of Exercise per Week:   . Minutes of Exercise per Session:   Stress:   . Feeling of Stress :   Social Connections:   . Frequency of Communication with Friends and Family:   . Frequency of Social Gatherings with Friends and Family:   . Attends Religious Services:   . Active Member of Clubs or Organizations:   . Attends Archivist Meetings:   [name] Kitchen Marital Status:   Intimate Partner Violence:   . Fear of Current or Ex-Partner:   . Emotionally Abused:   [name] Kitchen Physically Abused:   . Sexually Abused:     Outpatient Medications Prior to Visit  Medication Sig Dispense Refill  . acetaminophen (TYLENOL) 500 MG tablet Take 1,000 mg by mouth every 6 (six) hours as needed for mild pain.     [name] Kitchen albuterol (PROAIR HFA) 108 (90 Base) MCG/ACT inhaler Inhale 2 puffs into the lungs every 6 (six) hours as needed for wheezing or shortness of breath. 8 g 3  . albuterol (PROVENTIL) (2.5 MG/3ML) 0.083% nebulizer solution Take 3 mLs (2.5 mg total) by nebulization every 6 (six) hours as needed for wheezing or shortness of breath. 100 mL 2  . aspirin 325 MG tablet Take 1 tablet (325 mg total) by mouth daily. 30 tablet 0  . atorvastatin (LIPITOR) 40 MG tablet  Take 1 tablet (40 mg total) by mouth daily at 6 PM. 90 tablet 1  . budesonide-formoterol (SYMBICORT) 160-4.5 MCG/ACT inhaler Inhale 2 puffs into the lungs 2 (two) times daily. 18 g 5  . calcitRIOL (ROCALTROL) 0.25 MCG capsule Take 1 capsule (0.25 mcg total) by mouth daily. 30 capsule 2  . calcium carbonate (TUMS - DOSED IN MG ELEMENTAL CALCIUM) 500 MG chewable tablet Chew 1-2 tablets by mouth daily as needed for indigestion (gas).     [name] Kitchen dextromethorphan (DELSYM) 30 MG/5ML liquid Take by mouth as needed for cough.    . docusate sodium (COLACE) 100 MG capsule Take  1 capsule (100 mg total) by mouth 2 (two) times daily. 60 capsule 0  . febuxostat (ULORIC) 40 MG tablet Take 1 tablet (40 mg total) by mouth daily. 90 tablet 1  . furosemide (LASIX) 40 MG tablet Take 1 tablet (40 mg total) by mouth 2 (two) times daily. For CHF 180 tablet 1  . magnesium oxide (MAG-OX) 400 (241.3 Mg) MG tablet Take 1 tablet (400 mg total) by mouth daily. 30 tablet 0  . metolazone (ZAROXOLYN) 2.5 MG tablet Take 2.5 mg by mouth 2 (two) times a week.     . montelukast (SINGULAIR) 10 MG tablet Take 1 tablet (10 mg total) by mouth at bedtime. 90 tablet 1  . pantoprazole (PROTONIX) 40 MG tablet Take 1 tablet (40 mg total) by mouth daily. 30 tablet 0  . predniSONE (STERAPRED UNI-PAK 21 TAB) 10 MG (21) TBPK tablet 3 tab orally daily x 2 days then 2 tab po daily x 2 days then 1 tab po daily x 2 days 12 tablet 0  . simethicone (MYLICON) 80 MG chewable tablet Chew 1 tablet (80 mg total) by mouth 4 (four) times daily as needed for flatulence. 30 tablet 0   No facility-administered medications prior to visit.    Allergies  Allergen Reactions  . Sulfa Antibiotics Hives and Rash    ROS Review of Systems  Constitutional: Negative for activity change and appetite change.  HENT: Negative.   Eyes: Negative.   Respiratory: Negative.   Cardiovascular: Negative.   Gastrointestinal: Negative.   Genitourinary: Negative.   Skin:  Negative.   Neurological: Negative.   Hematological: Negative.   Psychiatric/Behavioral: Negative.       Objective:    Physical Exam  Constitutional: She is oriented to person, place, and time. She appears well-developed and well-nourished.  HENT:  Head: Normocephalic and atraumatic.  Cardiovascular: Normal rate and regular rhythm.  Pulmonary/Chest: Effort normal and breath sounds normal.  Abdominal: Soft. Bowel sounds are normal.  Musculoskeletal:        General: Normal range of motion.  Neurological: She is alert and oriented to person, place, and time.  Skin: Skin is warm and dry.  Psychiatric: She has a normal mood and affect. Her behavior is normal. Judgment and thought content normal.    BP (!) 103/59 (BP Location: Left Arm, Patient Position: Sitting, Cuff Size: Large)   Pulse 67   Temp (!) 97 F (36.1 C) (Temporal)   Ht _0  (1.676 m)   Wt (!) 320 lb (145.2 kg)   SpO2 97%   BMI 51.65 kg/m  Wt Readings from Last 3 Encounters:  12/26/19 (!) 320 lb (145.2 kg)  12/17/19 (!) 321 lb 6.9 oz (145.8 kg)  12/15/19 (!) 332 lb 6.4 oz (150.8 kg)     Health Maintenance Due  Topic Date Due  . PNA vac Low Risk Adult (2 of 2 - PPSV23) 04/14/2019    There are no preventive care reminders to display for this patient.  Lab Results  Component Value Date   TSH 3.934 05/21/2014   Lab Results  Component Value Date   WBC 6.4 12/20/2019   HGB 12.0 12/20/2019   HCT 38.3 12/20/2019   MCV 70.0 (L) 12/20/2019   PLT 171 12/20/2019   Lab Results  Component Value Date   NA 137 12/20/2019   K 3.6 12/20/2019   CHLORIDE 103 05/31/2017   CO2 31 12/20/2019   GLUCOSE 159 (H) 12/20/2019   BUN 91 (H) 12/20/2019   CREATININE  2.39 (H) 12/20/2019   BILITOT 0.5 12/19/2019   ALKPHOS 112 12/19/2019   AST 24 12/19/2019   ALT 16 12/19/2019   PROT 6.6 12/19/2019   ALBUMIN 3.1 (L) 12/19/2019   CALCIUM 8.5 (L) 12/20/2019   ANIONGAP 15 12/20/2019   EGFR 38 (L) 05/31/2017   Lab Results   Component Value Date   CHOL 130 03/01/2019   Lab Results  Component Value Date   HDL 48 03/01/2019   Lab Results  Component Value Date   LDLCALC 61 03/01/2019   Lab Results  Component Value Date   TRIG 105 03/01/2019   Lab Results  Component Value Date   CHOLHDL 2.7 03/01/2019   Lab Results  Component Value Date   HGBA1C 6.2 (H) 12/17/2019      Assessment & Plan:   Problem List Items Addressed This Visit      Cardiovascular and Mediastinum   Essential hypertension   Relevant Orders   POCT Urinalysis Dipstick   Basic Metabolic Panel     Other   Hypomagnesemia   Relevant Orders   Magnesium    Other Visit Diagnoses    Need for vaccination    -  Primary   Relevant Orders   Pneumococcal polysaccharide vaccine 23-valent greater than or equal to 2yo subcutaneous/IM      History of COPD Patient was admitted to hospital for 3 days of worsening shortness of breath.  Patient was thought to have an exacerbation of COPD.  She was discharged on Medrol Dosepak.  Patient is doing well and is without complaint.  Recommend that patient follows up with pulmonology within the next several weeks for pulmonary function test.  Patient also has a history of sarcoidosis.  Essential hypertension Blood pressures within normal limits, no medication changes warranted on today. - POCT Urinalysis Dipstick - Basic Metabolic Panel  Hypomagnesemia During inpatient admission, magnesium was found below.  Repleted with magnesium supplement.  We will recheck on today.  We will also follow potassium level. - Magnesium  Stage 3 chronic kidney disease, unspecified whether stage 3a or 3b CKD Patient is followed by nephrology for this problem.  Creatinine baseline is 2.2-2.3 and has been stable over the past several months.  We will follow BMP.   Need for vaccination - Pneumococcal polysaccharide vaccine 23-valent greater than or equal to 2yo subcutaneous/IM  Urine WBC increased - Urine  Culture  Sarcoidosis Follow-up with pulmonologist as scheduled  Obstructive sleep apnea Continue nocturnal oxygen  Class 3 severe obesity due to excess calories with serious comorbidity and body mass index (BMI) of 50.0 to 59.9 in adult [facility]) The patient is asked to make an attempt to improve diet and exercise patterns to aid in medical management of this problem.  Follow-up: Return in about 3 months (around 03/27/2020).   [name]  APRN, MSN, FNP-C Patient [facility] [address] [place], [place] [address] [phone]

## 2019-12-26 NOTE — Patient Instructions (Signed)
Will follow up by phone with your lab results.  I will fax your creatinine results to [facility]  Follow up with pulmonologist as scheduled  Chronic Kidney Disease, Adult Chronic kidney disease (CKD) happens when the kidneys are damaged over a long period of time. The kidneys are two organs that help with:  Getting rid of waste and extra fluid from the blood.  Making hormones that maintain the amount of fluid in your tissues and blood vessels.  Making sure that the body has the right amount of fluids and chemicals. Most of the time, CKD does not go away, but it can usually be controlled. Steps must be taken to slow down the kidney damage or to stop it from getting worse. If this is not done, the kidneys may stop working. Follow these instructions at home: Medicines  Take over-the-counter and prescription medicines only as told by your doctor. You may need to change the amount of medicines you take.  Do not take any new medicines unless your doctor says it is okay. Many medicines can make your kidney damage worse.  Do not take any vitamin and supplements unless your doctor says it is okay. Many vitamins and supplements can make your kidney damage worse. General instructions  Follow a diet as told by your doctor. You may need to stay away from: ? Alcohol. ? Salty foods. ? Foods that are high in:  Potassium.  Calcium.  Protein.  Do not use any products that contain nicotine or tobacco, such as cigarettes and e-cigarettes. If you need help quitting, ask your doctor.  Keep track of your blood pressure at home. Tell your doctor about any changes.  If you have diabetes, keep track of your blood sugar as told by your doctor.  Try to stay at a healthy weight. If you need help, ask your doctor.  Exercise at least 30 minutes a day, 5 days a week.  Stay up-to-date with your shots (immunizations) as told by your doctor.  Keep all follow-up visits as told by your doctor.  This is important. Contact a doctor if:  Your symptoms get worse.  You have new symptoms. Get help right away if:  You have symptoms of end-stage kidney disease. These may include: ? Headaches. ? Numbness in your hands or feet. ? Easy bruising. ? Having hiccups often. ? Chest pain. ? Shortness of breath. ? Stopping of menstrual periods in women.  You have a fever.  You have very little pee (urine).  You have pain or bleeding when you pee. Summary  Chronic kidney disease (CKD) happens when the kidneys are damaged over a long period of time.  Most of the time, this condition does not go away, but it can usually be controlled. Steps must be taken to slow down the kidney damage or to stop it from getting worse.  Treatment may include a combination of medicines and lifestyle changes. This information is not intended to replace advice given to you by your health care provider. Make sure you discuss any questions you have with your health care provider. Document Revised: 06/18/2017 Document Reviewed: 08/10/2016 Elsevier Patient Education  2020 [employer].

## 2019-12-27 ENCOUNTER — Other Ambulatory Visit: Payer: Self-pay | Admitting: Family Medicine

## 2019-12-27 ENCOUNTER — Telehealth: Payer: Self-pay

## 2019-12-27 DIAGNOSIS — E876 Hypokalemia: Secondary | ICD-10-CM

## 2019-12-27 LAB — BASIC METABOLIC PANEL
BUN/Creatinine Ratio: 38 — ABNORMAL HIGH (ref 12–28)
BUN: 89 mg/dL (ref 8–27)
CO2: 30 mmol/L — ABNORMAL HIGH (ref 20–29)
Calcium: 9.4 mg/dL (ref 8.7–10.3)
Chloride: 91 mmol/L — ABNORMAL LOW (ref 96–106)
Creatinine, Ser: 2.37 mg/dL — ABNORMAL HIGH (ref 0.57–1.00)
GFR calc Af Amer: 24 mL/min/{1.73_m2} — ABNORMAL LOW (ref >59)
GFR calc non Af Amer: 21 mL/min/{1.73_m2} — ABNORMAL LOW (ref >59)
Glucose: 90 mg/dL (ref 65–99)
Potassium: 3 mmol/L — ABNORMAL LOW (ref 3.5–5.2)
Sodium: 138 mmol/L (ref 134–144)

## 2019-12-27 LAB — MAGNESIUM: Magnesium: 1.8 mg/dL (ref 1.6–2.3)

## 2019-12-27 MED ORDER — POTASSIUM CHLORIDE ER 20 MEQ PO TBCR: 10.0000 meq | EXTENDED_RELEASE_TABLET | Freq: Every day | ORAL | 0 refills | Status: DC

## 2019-12-27 NOTE — Progress Notes (Signed)
Meds ordered this encounter  Medications   potassium chloride 20 MEQ TBCR    Sig: Take 10 mEq by mouth daily for 15 days.    Dispense:  15 tablet    Refill:  0    Order Specific Question:   Supervising Provider    Answer:   Tresa Garter [[id number]]     [name]  APRN, MSN, FNP-C Patient [facility] [address] [place], [place] [address] [phone]

## 2019-12-27 NOTE — Telephone Encounter (Signed)
-----   Message from Dorena Dew, [place] sent at 12/27/2019 12:18 PM EDT ----- Regarding: lab results Please inform patient that creatinine continues to be increased, however it is consistent with previous.  We will fax your lab results to [facility].  Your potassium level is low, we will supplement with K. Dur 20 mEq daily for total of 15 days.  After completing potassium, patient will need to return to clinic for a BMP.Continue all other medications as prescribed.  [name]  APRN, MSN, FNP-C Patient [facility] [address] [place], [place] [address] [phone]

## 2019-12-27 NOTE — Telephone Encounter (Signed)
Called and notified patient of her lab results, also told patient about starting K+ , made her a lab appt for 15 day to come in have her potassium recheck.pt understood results w/o any concerns.

## 2019-12-28 LAB — URINE CULTURE

## 2019-12-29 ENCOUNTER — Other Ambulatory Visit: Payer: Self-pay | Admitting: Family Medicine

## 2019-12-29 DIAGNOSIS — N390 Urinary tract infection, site not specified: Secondary | ICD-10-CM

## 2019-12-29 MED ORDER — AMOXICILLIN 500 MG PO CAPS: 500.0000 mg | ORAL_CAPSULE | Freq: Two times a day (BID) | ORAL | 0 refills | Status: AC

## 2019-12-29 NOTE — Progress Notes (Signed)
° °  Meds ordered this encounter  Medications   amoxicillin (AMOXIL) 500 MG capsule    Sig: Take 1 capsule (500 mg total) by mouth 2 (two) times daily for 7 days.    Dispense:  14 capsule    Refill:  0    Order Specific Question:   Supervising Provider    Answer:   Tresa Garter [[id number]]     [name]  APRN, MSN, FNP-C Patient [name] [address] [place], [place] [address] [phone]

## 2020-01-01 ENCOUNTER — Telehealth: Payer: Self-pay

## 2020-01-01 NOTE — Telephone Encounter (Signed)
Called spoke with patient inform her of lab results, start meds for infection 7 days 500mg  every 12 hours

## 2020-01-01 NOTE — Telephone Encounter (Signed)
-----   Message from Dorena Dew, [place] sent at 12/29/2019 11:35 AM EDT ----- Regarding: lab results Please inform patient that urine culture yielded Group B strep. Amoxicillin 500 mg every 12 hours was sent to pharmacy for a total of 7 days.   Increase water intake, wipe from front to back, and practice good perineal hygiene.    [name]  APRN, MSN, FNP-C Patient [facility] [address] [place], [place] [address] [phone]

## 2020-01-10 ENCOUNTER — Encounter: Payer: Self-pay | Admitting: Primary Care

## 2020-01-10 ENCOUNTER — Ambulatory Visit (INDEPENDENT_AMBULATORY_CARE_PROVIDER_SITE_OTHER): Payer: Medicare Other | Admitting: Primary Care

## 2020-01-10 ENCOUNTER — Other Ambulatory Visit: Payer: Self-pay

## 2020-01-10 VITALS — BP 110/60 | HR 64 | Temp 97.2°F | Ht 66.0 in | Wt 326.6 lb

## 2020-01-10 DIAGNOSIS — J4520 Mild intermittent asthma, uncomplicated: Secondary | ICD-10-CM | POA: Diagnosis not present

## 2020-01-10 DIAGNOSIS — J9611 Chronic respiratory failure with hypoxia: Secondary | ICD-10-CM

## 2020-01-10 DIAGNOSIS — J441 Chronic obstructive pulmonary disease with (acute) exacerbation: Secondary | ICD-10-CM | POA: Diagnosis not present

## 2020-01-10 DIAGNOSIS — I5030 Unspecified diastolic (congestive) heart failure: Secondary | ICD-10-CM | POA: Diagnosis not present

## 2020-01-10 DIAGNOSIS — J9612 Chronic respiratory failure with hypercapnia: Secondary | ICD-10-CM

## 2020-01-10 DIAGNOSIS — D869 Sarcoidosis, unspecified: Secondary | ICD-10-CM

## 2020-01-10 NOTE — Patient Instructions (Addendum)
Pleasure meeting you Ms. Gravely  Recommendations: - Continue Symbicort 160 2 puffs twice daily (mouth after use) - Continue Singulair 10 mg at bedtime - Continue Lasix 80 mg twice daily - Continue Zaroxolyn 2.5 mg 2 times a week -If you notice increased shortness of breath, wheezing or cough please notify our office -If you notice increased weight gain or lower extremity edema please contact your cardiologist or PCP  Orders: -Spirometry with DLCO and FENO in 3 months  Follow-up: - Please reschedule follow-up visit with Dr. Work scheduled for August (recommend 60-months) - Continue to follow-up with primary care provider regularly as recommended

## 2020-01-10 NOTE — Progress Notes (Signed)
@Patient  ID: Pamela [name], female    DOB: 11-30-1950, 69 y.o.   MRN: [id number]  Chief Complaint  Patient presents with  . Hospitalization Follow-up    admitted 12/17/19-12/20/19 for COPD exacerbation    Referring provider: Dorena Dew, FNP  HPI: 69 year old female, never smoked.  Significant for moderate persistent asthma, sarcoidosis, CHF, hypertension, chronic kidney disease stage III, obesity.  Patient of Dr. Melvyn Novas, last seen August 2020.  Maintained on Symbicort and Singulair.   Hospital admission: Patient recently hospitalized from 12/17/2019-12/20/2019 for acute on chronic hypoxemic respiratory failure felt to be due to multiple underlying conditions including pulmonary sarcoidosis, morbid obesity, OHS/OSA, COPD exacerbation with possible fibrosis from radiation treatment for lung cancer.  She was treated for COPD exacerbation with nebulizers and IV steroids with significant improvement in her symptoms.  She was weaned off oxygen. CT chest done the week prior without acute changes.  Patient's baseline creatinine is between 1.8-2.3.  While in hospital her Cr was 2.14-2.3.  She follows with nephrology outpatient.  She will continue Lasix and Zaroxolyn on discharge for CHF.  01/10/2020 Patient presents today for hospital follow-up visit.  She is doing well with no acute complaints. She is compliant with Symbicort 160 2 puffs twice daily and Singulair. She has not needed to use her rescue inhaler. She saw her PCP two weeks ago. She had labs drawn and urine culture was positive for hemolytic strep group B. She was treated with amoxicillin 500 mg twice daily for 7 days. Denies weight gain, LE edema, chest pain, shortness of breath, wheezing or cough.   Imaging:  12/08/19 CT chest wo contrast: Unchanged post treatment appearance of the right lung with bandlike radiation fibrosis and consolidation of the right upper lobe. No evidence of malignant recurrence or metastatic disease in the chest.  Stable small bilateral pulmonary nodules measuring up to 4 mm, very likely benign. Unchanged soft tissue mass of the right pectoralis major and minor musculature, measuring approximately 5.1 x 3.0 cm, of uncertain nature.  Cardiac testing: Echocardiogram 09/06/18 - EF 60 to 65% with normal cavity size.  Left ventricular diastolic parameters were normal.  Allergies  Allergen Reactions  . Sulfa Antibiotics Hives and Rash    Immunization History  Administered Date(s) Administered  . Influenza,inj,Quad PF,6+ Mos 04/13/2014, 04/16/2015, 04/03/2016, 05/19/2017, 06/15/2018, 05/14/2019  . PFIZER SARS-COV-2 Vaccination 10/14/2019, 11/08/2019  . Pneumococcal Conjugate-13 04/03/2016  . Pneumococcal Polysaccharide-23 04/13/2014, 12/26/2019  . Tdap 01/02/2015  . Zoster 05/21/2014    Past Medical History:  Diagnosis Date  . Arthritis   . Asthma   . Cancer ([place])    lung, adenocarcinoma right lung 2012  . CHF (congestive heart failure) ([facility])    Preserved EF  . Congenital single kidney    With chronic kidney disease  . COPD (chronic obstructive pulmonary disease) ([place])   . Gout   . Hypertension   . Lung cancer [facility]) 2012   Right upper lobe lung adenocarcinoma diagnosed with needle biopsy treated by SBRT finished treatment April 2013 has been monitored since  . Mass of chest wall, right    Right chest wall mass 7.3 cm biopsy on 12/13/2013. Patient notes it was consistent with sarcoidosis but actual pathology results not available.  . Oxygen deficiency   . Sarcoidosis   . Sickle cell trait ([place])   . Sleep apnea     Tobacco History: Social History   Tobacco Use  Smoking Status Never Smoker  Smokeless Tobacco Never Used   Counseling  given: Not Answered   Outpatient Medications Prior to Visit  Medication Sig Dispense Refill  . acetaminophen (TYLENOL) 500 MG tablet Take 1,000 mg by mouth every 6 (six) hours as needed for mild pain.     [name] Kitchen albuterol (PROAIR HFA) 108 (90 Base) MCG/ACT  inhaler Inhale 2 puffs into the lungs every 6 (six) hours as needed for wheezing or shortness of breath. 8 g 3  . albuterol (PROVENTIL) (2.5 MG/3ML) 0.083% nebulizer solution Take 3 mLs (2.5 mg total) by nebulization every 6 (six) hours as needed for wheezing or shortness of breath. 100 mL 2  . aspirin 325 MG tablet Take 1 tablet (325 mg total) by mouth daily. 30 tablet 0  . atorvastatin (LIPITOR) 40 MG tablet Take 1 tablet (40 mg total) by mouth daily at 6 PM. 90 tablet 1  . budesonide-formoterol (SYMBICORT) 160-4.5 MCG/ACT inhaler Inhale 2 puffs into the lungs 2 (two) times daily. 18 g 5  . calcitRIOL (ROCALTROL) 0.25 MCG capsule Take 1 capsule (0.25 mcg total) by mouth daily. 30 capsule 2  . calcium carbonate (TUMS - DOSED IN MG ELEMENTAL CALCIUM) 500 MG chewable tablet Chew 1-2 tablets by mouth daily as needed for indigestion (gas).     [name] Kitchen dextromethorphan (DELSYM) 30 MG/5ML liquid Take by mouth as needed for cough.    . docusate sodium (COLACE) 100 MG capsule Take 1 capsule (100 mg total) by mouth 2 (two) times daily. 60 capsule 0  . febuxostat (ULORIC) 40 MG tablet Take 1 tablet (40 mg total) by mouth daily. 90 tablet 1  . furosemide (LASIX) 40 MG tablet Take 1 tablet (40 mg total) by mouth 2 (two) times daily. For CHF 180 tablet 1  . magnesium oxide (MAG-OX) 400 (241.3 Mg) MG tablet Take 1 tablet (400 mg total) by mouth daily. 30 tablet 0  . metolazone (ZAROXOLYN) 2.5 MG tablet Take 2.5 mg by mouth 2 (two) times a week.     . montelukast (SINGULAIR) 10 MG tablet Take 1 tablet (10 mg total) by mouth at bedtime. 90 tablet 1  . pantoprazole (PROTONIX) 40 MG tablet Take 1 tablet (40 mg total) by mouth daily. 30 tablet 0  . potassium chloride 20 MEQ TBCR Take 10 mEq by mouth daily for 15 days. 15 tablet 0  . potassium chloride SA (KLOR-CON) 20 MEQ tablet Take 20 mEq by mouth daily.    . simethicone (MYLICON) 80 MG chewable tablet Chew 1 tablet (80 mg total) by mouth 4 (four) times daily as needed  for flatulence. 30 tablet 0  . predniSONE (STERAPRED UNI-PAK 21 TAB) 10 MG (21) TBPK tablet 3 tab orally daily x 2 days then 2 tab po daily x 2 days then 1 tab po daily x 2 days 12 tablet 0   No facility-administered medications prior to visit.   Review of Systems  Review of Systems  Constitutional: Negative for unexpected weight change.  Respiratory: Negative for cough, chest tightness, shortness of breath and wheezing.   Cardiovascular: Negative for chest pain and leg swelling.  Psychiatric/Behavioral: Negative for sleep disturbance.   Physical Exam  BP 110/60 (BP Location: Left Arm, Cuff Size: Large)   Pulse 64   Temp (!) 97.2 F (36.2 C) (Oral)   Ht 5\' 6"  (1.676 m)   Wt (!) 326 lb 9.6 oz (148.1 kg)   SpO2 100%   BMI 52.71 kg/m  Physical Exam Constitutional:      General: She is not in acute distress.  Appearance: Normal appearance. She is obese. She is not ill-appearing.  HENT:     Head: Normocephalic and atraumatic.     Mouth/Throat:     Mouth: Mucous membranes are moist.     Pharynx: Oropharynx is clear.  Cardiovascular:     Rate and Rhythm: Normal rate and regular rhythm.  Pulmonary:     Effort: Pulmonary effort is normal.     Breath sounds: Wheezing present.     Comments: Scant wheezing to right mid-lower lung base Skin:    General: Skin is warm and dry.  Neurological:     General: No focal deficit present.     Mental Status: She is alert and oriented to person, place, and time. Mental status is at baseline.  Psychiatric:        Mood and Affect: Mood normal.        Behavior: Behavior normal.        Thought Content: Thought content normal.        Judgment: Judgment normal.      Lab Results:  CBC    Component Value Date/Time   WBC 6.4 12/20/2019 0526   RBC 5.47 (H) 12/20/2019 0526   HGB 12.0 12/20/2019 0526   HGB 11.0 (L) 08/29/2019 0941   HGB 11.8 05/31/2017 1050   HCT 38.3 12/20/2019 0526   HCT 35.4 08/29/2019 0941   HCT 37.5 05/31/2017 1050     PLT 171 12/20/2019 0526   PLT 195 08/29/2019 0941   MCV 70.0 (L) 12/20/2019 0526   MCV 72 (L) 08/29/2019 0941   MCV 71.2 (L) 05/31/2017 1050   MCH 21.9 (L) 12/20/2019 0526   MCHC 31.3 12/20/2019 0526   RDW 15.4 12/20/2019 0526   RDW 16.2 (H) 08/29/2019 0941   RDW 15.1 (H) 05/31/2017 1050   LYMPHSABS 1.3 12/17/2019 1009   LYMPHSABS 1.0 08/29/2019 0941   LYMPHSABS 1.2 05/31/2017 1050   MONOABS 0.3 12/17/2019 1009   MONOABS 0.4 05/31/2017 1050   EOSABS 1.1 (H) 12/17/2019 1009   EOSABS 1.0 (H) 08/29/2019 0941   BASOSABS 0.0 12/17/2019 1009   BASOSABS 0.0 08/29/2019 0941   BASOSABS 0.0 05/31/2017 1050    BMET    Component Value Date/Time   NA 138 12/26/2019 1116   NA 142 05/31/2017 1050   K 3.0 (L) 12/26/2019 1116   K 4.2 05/31/2017 1050   CL 91 (L) 12/26/2019 1116   CL 101 09/20/2018 0000   CO2 30 (H) 12/26/2019 1116   CO2 27 09/20/2018 0000   CO2 30 (H) 05/31/2017 1050   GLUCOSE 90 12/26/2019 1116   GLUCOSE 159 (H) 12/20/2019 0526   GLUCOSE 94 05/31/2017 1050   BUN 89 (HH) 12/26/2019 1116   BUN 26.0 05/31/2017 1050   CREATININE 2.37 (H) 12/26/2019 1116   CREATININE 1.66 (H) 06/23/2018 1300   CREATININE 1.6 (H) 05/31/2017 1050   CALCIUM 9.4 12/26/2019 1116   CALCIUM 8.2 09/20/2018 0000   CALCIUM 9.0 05/31/2017 1050   GFRNONAA 21 (L) 12/26/2019 1116   GFRNONAA 32 (L) 06/23/2018 1300   GFRNONAA 36 (L) 05/19/2017 1210   GFRAA 24 (L) 12/26/2019 1116   GFRAA 37 (L) 06/23/2018 1300   GFRAA 41 (L) 05/19/2017 1210    BNP    Component Value Date/Time   BNP 27.5 12/17/2019 1009   BNP 65.7 08/31/2014 0937    ProBNP    Component Value Date/Time   PROBNP 1,193.0 (H) 06/11/2014 1929    Imaging: DG Chest Port 1 [address]  Result Date: 12/17/2019 CLINICAL DATA:  Shortness of breath.  History of lung carcinoma EXAM: PORTABLE CHEST 1 VIEW COMPARISON:  Chest CT Dec 08, 2019; chest radiograph July 27, 2019 FINDINGS: Soft tissue prominence in the right perihilar region  appears consistent with known radiation therapy change in this area. There is no edema or airspace opacity. Heart is upper normal in size with pulmonary vascularity normal. No adenopathy appreciable by portable radiographic examination. Loop recorder noted on the left. No bone lesions. IMPRESSION: Soft apparent radiation therapy change in the right perihilar region. No edema or airspace opacity. Stable cardiac silhouette. Loop recorder on the left. Electronically Signed   By: Lowella Grip III M.D.   On: 12/17/2019 10:06     Assessment & Plan:   Asthma, chronic - Hx obstructive asthma, recently hospitalized end of May 2020 for acute exacerbation treated with nebulizer and IV steriods. She is back to her baseline with not acute complaints. Scant wheezing right lung.  - Continue Symbicort 160 2 puffs twice daily (mouth after use) - Continue Singulair 10 mg at bedtime - Check spirometry with DLCO and FENO prior to next visit  - FU in 3 months with Dr. Melvyn Novas (she needs to reschedule her current apt with him in August d/t being out of town)  Chronic respiratory failure with hypoxia and hypercapnia ([place]) - Not currently on oxygen - O2 100% today on room air   CHF (congestive heart failure) - No evidence of fluid overload on exam today - Echocardiogram in February 2020 showed normal EF. LV diastolic parameters were normal  - She continues Lasix 80 mg twice daily and Zaroxolyn 2.5 mg twice a week  Sarcoidosis - Appears stable, continue to monitor - Denies chest pain, cough or shortness of breath    Martyn Ehrich, NP 01/10/2020

## 2020-01-10 NOTE — Assessment & Plan Note (Addendum)
-   Hx obstructive asthma, recently hospitalized end of May 2020 for acute exacerbation treated with nebulizer and IV steriods. She is back to her baseline with not acute complaints. Scant wheezing right lung.  - Continue Symbicort 160 2 puffs twice daily (mouth after use) - Continue Singulair 10 mg at bedtime - Check spirometry with DLCO and FENO prior to next visit  - FU in 3 months with Dr. Melvyn Novas (she needs to reschedule her current apt with him in August d/t being out of town)

## 2020-01-10 NOTE — Assessment & Plan Note (Addendum)
-   No evidence of fluid overload on exam today - Echocardiogram in February 2020 showed normal EF. LV diastolic parameters were normal  - She continues Lasix 80 mg twice daily and Zaroxolyn 2.5 mg twice a week

## 2020-01-10 NOTE — Assessment & Plan Note (Signed)
-   Not currently on oxygen - O2 100% today on room air

## 2020-01-10 NOTE — Assessment & Plan Note (Signed)
-   Appears stable, continue to monitor - Denies chest pain, cough or shortness of breath

## 2020-01-12 ENCOUNTER — Other Ambulatory Visit: Payer: Self-pay

## 2020-01-12 ENCOUNTER — Other Ambulatory Visit: Payer: Medicare Other

## 2020-01-12 DIAGNOSIS — E876 Hypokalemia: Secondary | ICD-10-CM

## 2020-01-13 LAB — POTASSIUM: Potassium: 3.2 mmol/L — ABNORMAL LOW (ref 3.5–5.2)

## 2020-01-16 ENCOUNTER — Encounter: Payer: Self-pay | Admitting: Family Medicine

## 2020-01-16 ENCOUNTER — Ambulatory Visit (INDEPENDENT_AMBULATORY_CARE_PROVIDER_SITE_OTHER): Payer: Medicare Other | Admitting: Family Medicine

## 2020-01-16 ENCOUNTER — Other Ambulatory Visit: Payer: Self-pay

## 2020-01-16 VITALS — BP 108/94 | HR 64 | Wt 326.0 lb

## 2020-01-16 DIAGNOSIS — I10 Essential (primary) hypertension: Secondary | ICD-10-CM

## 2020-01-19 ENCOUNTER — Telehealth: Payer: Self-pay

## 2020-01-19 NOTE — Telephone Encounter (Signed)
Recall cologuard letter mailed to patients home address on file.

## 2020-01-19 NOTE — Progress Notes (Signed)
Encounter opened in error. Appointment rescheduled.    [name]  APRN, MSN, FNP-C Patient [place] [address] [place], [place] [address] [phone]

## 2020-01-23 ENCOUNTER — Ambulatory Visit: Payer: Medicare Other | Admitting: Family Medicine

## 2020-01-26 ENCOUNTER — Telehealth: Payer: Self-pay | Admitting: Family Medicine

## 2020-01-26 ENCOUNTER — Other Ambulatory Visit: Payer: Self-pay | Admitting: Family Medicine

## 2020-01-26 MED ORDER — CALCITRIOL 0.25 MCG PO CAPS: 0.2500 ug | ORAL_CAPSULE | Freq: Every day | ORAL | 2 refills | Status: DC

## 2020-01-26 MED ORDER — METOLAZONE 2.5 MG PO TABS: 2.5000 mg | ORAL_TABLET | ORAL | 4 refills | Status: DC

## 2020-01-26 NOTE — Progress Notes (Signed)
Meds ordered this encounter  Medications  . calcitRIOL (ROCALTROL) 0.25 MCG capsule    Sig: Take 1 capsule (0.25 mcg total) by mouth daily.    Dispense:  90 capsule    Refill:  2    Order Specific Question:   Supervising Provider    Answer:   Tresa Garter [id number]  . metolazone (ZAROXOLYN) 2.5 MG tablet    Sig: Take 1 tablet (2.5 mg total) by mouth 2 (two) times a week.    Dispense:  12 tablet    Refill:  4    Order Specific Question:   Supervising Provider    Answer:   Tresa Garter [[id number]]    [name]  APRN, MSN, FNP-C Patient [facility] [address] [place], [place] [address] [phone]

## 2020-01-26 NOTE — Telephone Encounter (Signed)
Pamela [name] is a 69 year old female with numerous chronic conditions that cause requesting medication refills for 90 days.  Patient will and [facility] for the next 3 months and is afraid of running out of her chronic medications.  Medications have been sent to pharmacy for 90 days.  Spoke with patient concerning her medications at length.  She expressed understanding.  Also, if there are any further questions or concerns patient can contact me.   [name]  APRN, MSN, FNP-C Patient [name] [address] [place], [place] [address] [phone]

## 2020-01-29 NOTE — Telephone Encounter (Signed)
Done

## 2020-02-12 ENCOUNTER — Other Ambulatory Visit: Payer: Self-pay | Admitting: Family Medicine

## 2020-02-12 DIAGNOSIS — J4530 Mild persistent asthma, uncomplicated: Secondary | ICD-10-CM

## 2020-02-12 MED ORDER — ALBUTEROL SULFATE HFA 108 (90 BASE) MCG/ACT IN AERS: 2.0000 | INHALATION_SPRAY | Freq: Four times a day (QID) | RESPIRATORY_TRACT | 3 refills | Status: DC | PRN

## 2020-02-12 MED ORDER — CALCITRIOL 0.25 MCG PO CAPS: 0.2500 ug | ORAL_CAPSULE | Freq: Every day | ORAL | 2 refills | Status: DC

## 2020-02-12 MED ORDER — BUDESONIDE-FORMOTEROL FUMARATE 160-4.5 MCG/ACT IN AERO: 2.0000 | INHALATION_SPRAY | Freq: Two times a day (BID) | RESPIRATORY_TRACT | 5 refills | Status: DC

## 2020-02-12 NOTE — Telephone Encounter (Signed)
Sent to pharmacy. Thanks

## 2020-02-23 ENCOUNTER — Ambulatory Visit: Payer: Medicare Other | Admitting: Internal Medicine

## 2020-02-23 DIAGNOSIS — I444 Left anterior fascicular block: Secondary | ICD-10-CM | POA: Diagnosis present

## 2020-02-23 DIAGNOSIS — I519 Heart disease, unspecified: Secondary | ICD-10-CM | POA: Diagnosis present

## 2020-02-23 DIAGNOSIS — E785 Hyperlipidemia, unspecified: Secondary | ICD-10-CM | POA: Diagnosis present

## 2020-02-23 DIAGNOSIS — Z6841 Body Mass Index (BMI) 40.0 and over, adult: Secondary | ICD-10-CM | POA: Diagnosis not present

## 2020-02-23 DIAGNOSIS — Z8673 Personal history of transient ischemic attack (TIA), and cerebral infarction without residual deficits: Secondary | ICD-10-CM | POA: Diagnosis not present

## 2020-02-23 DIAGNOSIS — Z923 Personal history of irradiation: Secondary | ICD-10-CM | POA: Diagnosis not present

## 2020-02-23 DIAGNOSIS — E559 Vitamin D deficiency, unspecified: Secondary | ICD-10-CM | POA: Diagnosis present

## 2020-02-23 DIAGNOSIS — R0989 Other specified symptoms and signs involving the circulatory and respiratory systems: Secondary | ICD-10-CM | POA: Diagnosis not present

## 2020-02-23 DIAGNOSIS — Z20822 Contact with and (suspected) exposure to covid-19: Secondary | ICD-10-CM | POA: Diagnosis not present

## 2020-02-23 DIAGNOSIS — Z79899 Other long term (current) drug therapy: Secondary | ICD-10-CM | POA: Diagnosis not present

## 2020-02-23 DIAGNOSIS — N2581 Secondary hyperparathyroidism of renal origin: Secondary | ICD-10-CM | POA: Diagnosis present

## 2020-02-23 DIAGNOSIS — M109 Gout, unspecified: Secondary | ICD-10-CM | POA: Diagnosis present

## 2020-02-23 DIAGNOSIS — Z86718 Personal history of other venous thrombosis and embolism: Secondary | ICD-10-CM | POA: Diagnosis not present

## 2020-02-23 DIAGNOSIS — I509 Heart failure, unspecified: Secondary | ICD-10-CM | POA: Diagnosis not present

## 2020-02-23 DIAGNOSIS — Z743 Need for continuous supervision: Secondary | ICD-10-CM | POA: Diagnosis not present

## 2020-02-23 DIAGNOSIS — K5900 Constipation, unspecified: Secondary | ICD-10-CM | POA: Diagnosis not present

## 2020-02-23 DIAGNOSIS — J441 Chronic obstructive pulmonary disease with (acute) exacerbation: Secondary | ICD-10-CM | POA: Diagnosis not present

## 2020-02-23 DIAGNOSIS — R0602 Shortness of breath: Secondary | ICD-10-CM | POA: Diagnosis not present

## 2020-02-23 DIAGNOSIS — E876 Hypokalemia: Secondary | ICD-10-CM | POA: Diagnosis not present

## 2020-02-23 DIAGNOSIS — R918 Other nonspecific abnormal finding of lung field: Secondary | ICD-10-CM | POA: Diagnosis not present

## 2020-02-23 DIAGNOSIS — Z85118 Personal history of other malignant neoplasm of bronchus and lung: Secondary | ICD-10-CM | POA: Diagnosis not present

## 2020-02-23 DIAGNOSIS — J9811 Atelectasis: Secondary | ICD-10-CM | POA: Diagnosis present

## 2020-02-23 DIAGNOSIS — I890 Lymphedema, not elsewhere classified: Secondary | ICD-10-CM | POA: Diagnosis present

## 2020-02-23 DIAGNOSIS — N189 Chronic kidney disease, unspecified: Secondary | ICD-10-CM | POA: Diagnosis present

## 2020-02-23 DIAGNOSIS — Z8616 Personal history of COVID-19: Secondary | ICD-10-CM | POA: Diagnosis not present

## 2020-02-23 DIAGNOSIS — Q211 Atrial septal defect: Secondary | ICD-10-CM | POA: Diagnosis not present

## 2020-03-07 ENCOUNTER — Telehealth: Payer: Self-pay | Admitting: Family Medicine

## 2020-03-07 ENCOUNTER — Other Ambulatory Visit: Payer: Self-pay | Admitting: Internal Medicine

## 2020-03-07 NOTE — Telephone Encounter (Signed)
Which Med needs refill?

## 2020-03-08 ENCOUNTER — Telehealth: Payer: Self-pay | Admitting: Family Medicine

## 2020-03-11 ENCOUNTER — Other Ambulatory Visit: Payer: Self-pay | Admitting: Internal Medicine

## 2020-03-11 DIAGNOSIS — I5030 Unspecified diastolic (congestive) heart failure: Secondary | ICD-10-CM

## 2020-03-11 MED ORDER — FUROSEMIDE 40 MG PO TABS: 40.0000 mg | ORAL_TABLET | Freq: Two times a day (BID) | ORAL | 1 refills | Status: DC

## 2020-03-11 MED ORDER — METOLAZONE 2.5 MG PO TABS: 2.5000 mg | ORAL_TABLET | ORAL | 4 refills | Status: DC

## 2020-03-11 NOTE — Telephone Encounter (Signed)
Meds sent to [facility] MD.

## 2020-03-12 NOTE — Telephone Encounter (Signed)
Done

## 2020-04-01 ENCOUNTER — Encounter: Payer: Self-pay | Admitting: Internal Medicine

## 2020-04-01 ENCOUNTER — Ambulatory Visit (INDEPENDENT_AMBULATORY_CARE_PROVIDER_SITE_OTHER): Payer: Medicare Other | Admitting: Internal Medicine

## 2020-04-01 ENCOUNTER — Other Ambulatory Visit: Payer: Self-pay

## 2020-04-01 DIAGNOSIS — J9612 Chronic respiratory failure with hypercapnia: Secondary | ICD-10-CM | POA: Diagnosis not present

## 2020-04-01 DIAGNOSIS — J441 Chronic obstructive pulmonary disease with (acute) exacerbation: Secondary | ICD-10-CM | POA: Diagnosis not present

## 2020-04-01 DIAGNOSIS — J9611 Chronic respiratory failure with hypoxia: Secondary | ICD-10-CM

## 2020-04-01 DIAGNOSIS — J45901 Unspecified asthma with (acute) exacerbation: Secondary | ICD-10-CM

## 2020-04-01 DIAGNOSIS — I639 Cerebral infarction, unspecified: Secondary | ICD-10-CM | POA: Diagnosis not present

## 2020-04-01 MED ORDER — PANTOPRAZOLE SODIUM 40 MG PO TBEC: DELAYED_RELEASE_TABLET | ORAL | 0 refills | Status: DC

## 2020-04-01 MED ORDER — PREDNISONE 10 MG PO TABS: ORAL_TABLET | ORAL | 11 refills | Status: DC

## 2020-04-01 MED ORDER — FAMOTIDINE 20 MG PO TABS
ORAL_TABLET | ORAL | 11 refills | Status: DC
Start: 2020-04-01 — End: 2021-01-07

## 2020-04-01 NOTE — Progress Notes (Signed)
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     History of Present Illness  04/27/2014 1st [facility] office visit/ Pamela [name]   Chief Complaint  Patient presents with  . Pulmonary Consult    Referred by Dr. [name]. [name]  Pt recently moved to [place] from [name] and states needs to establish with new pulmonary MD.  She c/o DOE and cough, and states that she feels these symptoms are currently under control.    on prednisone 10 mg daily  since late July 2015 and still on freq saba and 2lpm  Already used   2puffs proair am of ov  rec Stop spiriva and advair Start dulera 100 Take 2 puffs first thing in am and then another 2 puffs about 12 hours later.  Work on inhaler technique:  relax and gently blow all the way out then take a nice smooth deep breath back in, triggering the inhaler at same time you start breathing in.  Hold for up to 5 seconds if you can.  Rinse and gargle with water when done Only use your albuterol (proair) as a rescue medication  If proair not helping, then use the neb and if needing the neb more than occastional, then take prednisone 10 mg daily  Please schedule a follow up office visit in 4 weeks, sooner if needed with all inhalers in hand for pfts on return   11/30/2014 f/u ov/Pamela [name] re: mild intermittent asthma ? Related to weather / still on acei plus on prednisone 10 mg daily for gout  Chief Complaint  Patient presents with  . Acute Visit    Pt c/o increased SOB for the past month. She is  also coughing and wheezing. Cough is prod with minimal yellow to clear sputum. She uses proair 2 x daily on average.   Also due for CT chest to f/u dx of lung ca dx March 2013  never surgery/ RT only  Onset of dry cough / wheeze was insidious/ pattern is progressively worse day >> noct with increasing  need for saba but very poor hfa  rec Stop lisinopril and start valsartan 80 mg one daily instead  Only use your albuterol as a rescue medication   Restart dulera 100 Take 2 puffs first thing in am and then another 2 puffs about 12 hours later until you only need  your rescue inhaler no more than twice weekly for cough/wheeze/ short of breath  Follow up with Dr [name] as planned and only see me if needed    06/06/2015  f/u ov/Pamela [name] re: worse since finished pred ? One week prior to [place]  / was taking for gout  Chief Complaint  Patient presents with  . Acute Visit    Increased SOB with or without exertion for the past 4 days. She also c/o cough and wheezing. Cough is prod with clear sputum.     Was doing much better  while on prednisone and gradually worse since off  Last used proventil about 7 am prior to OV  At 1130  On symbicort but hfa quite poor > see a/p Has neb but no meds for it  rec Prednisone Take 4 for three days 3 for three days 2 for three days 1 for three days and stop Work on inhaler technique:  relax and gently blow all the way out then take a nice smooth deep breath back in, triggering the inhaler at same time you start breathing in.  Hold for up to 5 seconds if you can. Blow out thru nose. Rinse and gargle with water when done Plan A = Automatic = symbicort 80 Take 2 puffs first thing in am and then another 2 puffs about 12 hours later.  Plan B = backup  Only use your albuterol(proair)   Plan C = crisis - only use nebulizer albuterol if you try the proair and it doesn't work         02/22/2018  f/u ov/Pamela [name] re: asthma / off 02 / symb/flonase / does not use med cal as rec    Chief Complaint  Patient presents with  . Follow-up    Breathing has been doing well. She states she has had cough with clear sputum for the past wk. She rarely uses albuterol inhaler or neb.   Dyspnea:  MMRC3 = can't walk 100 yards even at a slow pace at a flat grade s stopping due to sob or back Cough: not much Sleeping: < 30 degrees multiple pillows  SABA use: as above  rec Work on inhaler technique:  - late add: consider adding singulair if not well controlled      08/02/2018 acute extended ov/Pamela [name] re: sym 160 2bid / no longer flonase / was on pred 2.5 mg qod  Chief Complaint  Patient presents with  . Acute Visit    Pt c/o cough with yellow to green sputum for the past 5 days. She had fever yesterday.  She has been having to use her rescue inhaler and neb daily since her symptoms started.    states was doing fine on just symb 160  2 bid when abruptly ill x 5 d prior to OV  With st, fever, wheeze/ cough prod puruelnt sputum and fever one day prior to OV  But not on day of ov, feels better p saba  Previously not needing ventolin or neb but up to bid now -did not realize she could go to every 4 hours as needed as per the action plan she was already provided.  rec zpak  Prednisone 10 mg take  4 each am x 2 days,   2 each am x 2 days,  1 each am x 2 days and stop   Please schedule a follow up office visit in 4 weeks, sooner if needed pfts on return - add also needs feno on return     08/30/2018  f/u ov/Pamela [name] re: moderate chronic asthma maint on symb 160 2bid no prednisone listed  Chief Complaint  Patient presents with  . Follow-up    PFT's done today. Breathing has improved since the last visit. She uses her albuterol inhaler rarely.   Dyspnea:  Walks at [employer] with cane  Cough: none Sleeping:  Bed flat, 4 pillows  SABA use: none 02: no  rec - add singulair 10 mg daily due to high feno and repeat feno next ov    02/23/2019  f/u ov/Pamela [name] re:  Asthma / MO maint symb  160/singulair  Chief Complaint  Patient presents with  . Follow-up    Patient reports that her sob and cough have  been doing well.    Dyspnea:  [employer] with cane  Cough: no Sleeping: flat bed with 4 head pillows SABA use: not recently 02: none  rec No change rx  02/23/20 admit [place] and d/c 0 2  - had 3 days warning >  D/c on spiriva    04/01/2020  f/u ov/Pamela [name] re:  Asthma / MO  Chief Complaint  Patient presents with  . Follow-up    Reports productive cough x4 days.  Dyspnea:  [employer] with cane with or without 02  Cough: since 9/9 clear mucus Sleeping: flat bed with with 3 pillows  SABA use: neither right now  02: prn    No obvious day to day or daytime variability or assoc excess/ purulent sputum or mucus plugs or hemoptysis or cp or chest tightness, subjective wheeze or overt sinus or hb symptoms.   Sleeping without nocturnal  or early am exacerbation  of respiratory  c/o's or need for noct saba. Also denies any obvious fluctuation of symptoms with weather or environmental changes or other aggravating or alleviating factors except as outlined above   No unusual exposure hx or h/o childhood pna/ asthma or knowledge of premature birth.  Current Allergies, Complete Past Medical History, Past Surgical History, Family History, and Social History were reviewed in [employer] record.  ROS  The following are not active complaints unless bolded Hoarseness, sore throat, dysphagia, dental problems, itching, sneezing,  nasal congestion or discharge of excess mucus or purulent secretions, ear ache,   fever, chills, sweats, unintended wt loss or wt gain, classically pleuritic or exertional cp,  orthopnea pnd or arm/hand swelling  or leg swelling, presyncope, palpitations, abdominal pain, anorexia, nausea, vomiting, diarrhea  or change in bowel habits or change in bladder habits, change in stools or change in urine, dysuria, hematuria,  rash, arthralgias, visual  complaints, headache, numbness, weakness or ataxia or problems with walking or coordination,  change in mood or  memory.        Current Meds  Medication Sig  . acetaminophen (TYLENOL) 500 MG tablet Take 1,000 mg by mouth every 6 (six) hours as needed for mild pain.   [name] Kitchen albuterol (PROAIR HFA) 108 (90 Base) MCG/ACT inhaler Inhale 2 puffs into the lungs every 6 (six) hours as needed for wheezing or shortness of breath.  [name] Kitchen albuterol (PROVENTIL) (2.5 MG/3ML) 0.083% nebulizer solution Take 3 mLs (2.5 mg total) by nebulization every 6 (six) hours as needed for wheezing or shortness of breath.  [name] Kitchen aspirin 325 MG tablet Take 1 tablet (325 mg total) by mouth daily.  . budesonide-formoterol (SYMBICORT) 160-4.5 MCG/ACT inhaler Inhale 2 puffs into the lungs 2 (two) times daily.  . calcitRIOL (ROCALTROL) 0.25 MCG capsule Take 1 capsule (0.25 mcg total) by mouth daily.  . calcium carbonate (TUMS - DOSED IN MG ELEMENTAL CALCIUM) 500 MG chewable tablet Chew 1-2 tablets by mouth daily as needed for indigestion (gas).   [name] Kitchen dextromethorphan (DELSYM) 30 MG/5ML liquid Take by mouth as needed for cough.   . docusate sodium (COLACE) 100 MG capsule Take 1 capsule (100 mg total) by mouth 2 (two) times daily.  . febuxostat (ULORIC) 40 MG tablet Take 1 tablet (40 mg total) by mouth daily.  . furosemide (LASIX) 40 MG tablet Take 1 tablet (40 mg total) by mouth 2 (  two) times daily. For CHF  . magnesium oxide (MAG-OX) 400 (241.3 Mg) MG tablet Take 1 tablet (400 mg total) by mouth daily.  . metolazone (ZAROXOLYN) 2.5 MG tablet Take 1 tablet (2.5 mg total) by mouth 2 (two) times a week.  . montelukast (SINGULAIR) 10 MG tablet Take 1 tablet (10 mg total) by mouth at bedtime.                 Objective:   Physical Exam   04/01/2020  330   02/23/2019    354   08/30/2018  366  11/30/2014  343 > 06/06/2015  355 > 07/03/2015  360 > 06/01/2016  358 > 10/15/2016  366 > 02/25/2017   364 > 02/22/2018  365 > 08/02/2018  360 > >    Vital  signs reviewed  04/01/2020  - Note at rest 02 sats  91% on RA   HEENT : pt wearing mask not removed for exam due to covid -19 concerns.    NECK :  without JVD/Nodes/TM/ nl carotid upstrokes bilaterally   LUNGS: no acc muscle use,  Nl contour chest which is clear to A and P bilaterally without cough on insp or exp maneuvers   CV:  RRR  no s3 or murmur or increase in P2, and no edema   ABD:  Obese soft and nontender with nl inspiratory excursion in the supine position. No bruits or organomegaly appreciated, bowel sounds nl  MS:  Nl gait/ ext warm without deformities, calf tenderness, cyanosis or clubbing No obvious joint restrictions   SKIN: warm and dry with elephantiasis changes both LEs  NEURO:  alert, approp, nl sensorium with  no motor or cerebellar deficits apparent.

## 2020-04-01 NOTE — Patient Instructions (Addendum)
Plan A = Automatic = Always=    Symbicort 160 Take 2 puffs first thing in am and then another 2 puffs about 12 hours later.  And singulair at night and stop spiriva   Work on inhaler technique:  relax and gently blow all the way out then take a nice smooth deep breath back in, triggering the inhaler at same time you start breathing in.  Hold for up to 5 seconds if you can. Blow out thru nose. Rinse and gargle with water when done     Plan B = Backup (to supplement plan A, not to replace it) Only use your albuterol inhaler (puffer) as a rescue medication to be used if you can't catch your breath by resting or doing a relaxed purse lip breathing pattern.  - The less you use it, the better it will work when you need it. - Ok to use the inhaler up to 2 puffs  every 4 hours if you must but call for appointment if use goes up over your usual need - Don't leave home without it !!  (think of it like the spare tire for your car)    Plan C = Crisis (instead of Plan B but only if Plan B stops working) - only use your albuterol nebulizer if you first try Plan B and it fails to help > ok to use the nebulizer up to every 4 hours but if start needing it regularly call for immediate appointment   Plan D = Deltasone  - call me if B and C not adequate > add prednisone Prednisone 10 mg take  4 each am x 2 days,   2 each am x 2 days,  1 each am x 2 days and stop (refillable)   Plan E = ER - go to ER or call 911 if all else fails    Make sure you check your oxygen saturations at highest level of activity to be sure it stays over 90% and adjust upward to maintain this level if needed but remember to turn it back to previous settings when you stop (to conserve your supply).   Pantoprazole (protonix) 40 mg   Take  30-60 min before first meal of the day and Pepcid (famotidine)  20 mg one @  bedtime until return to office - this is the best way to tell whether stomach acid is contributing to your problem.    GERD  (REFLUX)  is an extremely common cause of respiratory symptoms just like yours , many times with no obvious heartburn at all.    It can be treated with medication, but also with lifestyle changes including elevation of the head of your bed (ideally with 6 -8inch blocks under the headboard of your bed),  Smoking cessation, avoidance of late meals, excessive alcohol, and avoid fatty foods, chocolate, peppermint, colas, red wine, and acidic juices such as orange juice.  NO MINT OR MENTHOL PRODUCTS SO NO COUGH DROPS  USE SUGARLESS CANDY INSTEAD (Jolley ranchers or Stover's or Life Savers) or even ice chips will also do - the key is to swallow to prevent all throat clearing. NO OIL BASED VITAMINS - use powdered substitutes.  Avoid fish oil when coughing.   Please schedule a follow up office visit in 6 weeks, call sooner if needed

## 2020-04-02 ENCOUNTER — Encounter: Payer: Self-pay | Admitting: Internal Medicine

## 2020-04-02 NOTE — Assessment & Plan Note (Signed)
Started 2lpm 24/7 in [place] around 2013  See pfts 07/19/2014 with completely reversible airflow obst (so this is not copd) and a dlco of 71% so this is not ILD - HC03   05/26/16  = 34 - HC03    02/06/17  = 27   As of 04/01/2020  02 is prn   Advised: Make sure you check your oxygen saturations at highest level of activity to be sure it stays over 90% and adjust upward to maintain this level if needed but remember to turn it back to previous settings when you stop (to conserve your supply).          Each maintenance medication was reviewed in detail including emphasizing most importantly the difference between maintenance and prns and under what circumstances the prns are to be triggered using an action plan format where appropriate.  Total time for H and P, chart review, counseling, teaching device and generating customized AVS unique to this office visit / charting = 21 min

## 2020-04-02 NOTE — Assessment & Plan Note (Signed)
Onset asthma 1990's while living in [name]  pfts 07/31/13  FEV1  1.53 (58%) with ratio 66 > p saba ratio 74 FEV1 1.68 (64%)  - -04/27/2014  try dulera 100 2 bid  - PFT's 07/19/2014 FEV1 2.00 (93%) and ratio 85 after 21% improvement from saba with no inhalers x one week  - trial off acei 12/01/2014 due to pseudoasthma component > resolved  - Spirometry 06/01/2016  FEV1 1.06 (48%)  Ratio 61  - FENO 06/01/2016  =   85 on symbicort 80 x2 > increase to 160  - Prednisone 10 mg floor x years, rec increase  to 20 mg if needing neb  - 10/15/2016 reduced pred to 5 mg daily (gout flares with taper)  - Spirometry 02/22/2018  FEV1 1.23 (57%)  Ratio 71 with min curvature  - FENO 02/22/2018  = 61 08/02/2018  After extensive coaching inhaler device,  effectiveness =    90%  FENO 08/30/2018  =  57 p symb 160 2bid > rec add singulair 10 mg daily  PFT's  08/30/2018  FEV1 1.69 (78 % ) ratio 0.79  p 54 % improvement from saba p symb 160 x2  prior to study with DLCO  112 % corrects to 154  % for alv volume   - 04/01/2020  Added pred x 6 days as Plan d   DDX of  difficult airways management almost all start with A and  include Adherence, Ace Inhibitors, Acid Reflux, Active Sinus Disease, Alpha 1 Antitripsin deficiency, Anxiety masquerading as Airways dz,  ABPA,  Allergy(esp in young), Aspiration (esp in elderly), Adverse effects of meds,  Active smoking or vaping, A bunch of PE's (a small clot burden can't cause this syndrome unless there is already severe underlying pulm or vascular dz with poor reserve) plus two Bs  = Bronchiectasis and Beta blocker use..and one C= CHF   Adherence is always the initial "prime suspect" and is a multilayered concern that requires a "trust but verify" approach in every patient - starting with knowing how to use medications, especially inhalers, correctly, keeping up with refills and understanding the fundamental difference between maintenance and prns vs those medications only taken for a very  short course and then stopped and not refilled.  - return with all meds in hand using a trust but verify approach to confirm accurate Medication  Reconciliation The principal here is that until we are certain that the  patients are doing what we've asked, it makes no sense to ask them to do more.   ? Acid (or non-acid) GERD > always difficult to exclude as up to 75% of pts in some series report no assoc GI/ Heartburn symptoms> rec max (24h)  acid suppression and diet restrictions/ reviewed and instructions given in writing.   ? Allergy > added pred as plan D

## 2020-04-04 ENCOUNTER — Ambulatory Visit (INDEPENDENT_AMBULATORY_CARE_PROVIDER_SITE_OTHER): Payer: Medicare Other | Admitting: Podiatry

## 2020-04-04 ENCOUNTER — Other Ambulatory Visit: Payer: Self-pay

## 2020-04-04 DIAGNOSIS — M79675 Pain in left toe(s): Secondary | ICD-10-CM | POA: Diagnosis not present

## 2020-04-04 DIAGNOSIS — B351 Tinea unguium: Secondary | ICD-10-CM

## 2020-04-04 DIAGNOSIS — M722 Plantar fascial fibromatosis: Secondary | ICD-10-CM

## 2020-04-04 DIAGNOSIS — M79674 Pain in right toe(s): Secondary | ICD-10-CM | POA: Diagnosis not present

## 2020-04-04 NOTE — Patient Instructions (Signed)

## 2020-04-07 NOTE — Progress Notes (Signed)
Subjective: 69 y.o. returns the office today for painful, elongated, thickened toenails which she cannot trim herself. Denies any redness or drainage around the nails.  Also also not stable injections both of her heels of the pain is very combative on the heels.  She denies any recent injury or trauma no swelling.  She describes discomfort most of the bottom of the heel.  Denies any acute changes since last appointment and no new complaints today. Denies any systemic complaints such as fevers, chills, nausea, vomiting.   PCP: Dorena Dew, FNP  Objective: AAO 3, NAD DP 2/4, PT 1/4, CRT less than 3 seconds; chronic lower extremity edema present  Nails hypertrophic, dystrophic, elongated, brittle, discolored 10. There is tenderness overlying the nails 1-5 bilaterally. There is no surrounding erythema or drainage along the nail sites. No open lesions or pre-ulcerative lesions are identified. There is tenderness palpation on the plantar medial tubercle of the calcaneus with insertion of plantar fascial.  Plantar fascia appears intact.  There is no pain along the course of the calcaneus.  Achilles tendon appears to be intact.  MMT 5/5. No pain with calf compression, swelling, warmth, erythema.  Assessment: Patient presents with symptomatic onychomycosis; plan fasciitis  Plan: -Treatment options including alternatives, risks, complications were discussed -Nails sharply debrided 10 without complication/bleeding. -Bilateral steroid injections performed.  See procedure note below.  Discussed traction, icing daily as well as wearing supportive shoes. -Discussed daily foot inspection. If there are any changes, to call the office immediately.  -Follow-up in 3 months or sooner if any problems are to arise. In the meantime, encouraged to call the office with any questions, concerns, changes symptoms.  Procedure: Injection Tendon/Ligament Discussed alternatives, risks, complications and verbal  consent was obtained.  Location: Bilateral plantar fascia at the glabrous junction; medial approach. Skin Prep: Alcohol  Injectate: 0.5cc 0.5% marcaine plain, 0.5 cc 2% lidocaine plain and, 1 cc kenalog 10. Disposition: Patient tolerated procedure well. Injection site dressed with a band-aid.  Post-injection care was discussed and return precautions discussed.   Celesta Gentile, DPM

## 2020-04-09 ENCOUNTER — Other Ambulatory Visit: Payer: Self-pay

## 2020-04-09 ENCOUNTER — Encounter: Payer: Self-pay | Admitting: Family Medicine

## 2020-04-09 ENCOUNTER — Ambulatory Visit (INDEPENDENT_AMBULATORY_CARE_PROVIDER_SITE_OTHER): Payer: Medicare Other | Admitting: Family Medicine

## 2020-04-09 VITALS — BP 98/48 | HR 66 | Temp 97.0°F | Ht 66.0 in | Wt 321.0 lb

## 2020-04-09 DIAGNOSIS — N183 Chronic kidney disease, stage 3 unspecified: Secondary | ICD-10-CM | POA: Diagnosis not present

## 2020-04-09 DIAGNOSIS — I639 Cerebral infarction, unspecified: Secondary | ICD-10-CM | POA: Diagnosis not present

## 2020-04-09 DIAGNOSIS — I10 Essential (primary) hypertension: Secondary | ICD-10-CM

## 2020-04-09 DIAGNOSIS — I5032 Chronic diastolic (congestive) heart failure: Secondary | ICD-10-CM

## 2020-04-09 DIAGNOSIS — R82998 Other abnormal findings in urine: Secondary | ICD-10-CM

## 2020-04-09 LAB — POCT URINALYSIS DIPSTICK
Bilirubin, UA: NEGATIVE
Blood, UA: NEGATIVE
Glucose, UA: NEGATIVE
Ketones, UA: NEGATIVE
Nitrite, UA: NEGATIVE
Protein, UA: NEGATIVE
Spec Grav, UA: 1.02 (ref 1.010–1.025)
Urobilinogen, UA: 0.2 E.U./dL
pH, UA: 5.5 (ref 5.0–8.0)

## 2020-04-09 NOTE — Progress Notes (Signed)
Patient [facility]  Established Patient Office Visit  Subjective:  Patient ID: Pamela [name], female    DOB: [birth year]  Age: 69 y.o. MRN: [id number]  CC:  Chief Complaint  Patient presents with  . Follow-up    patient was in hospital in August of 2021. Patient is feeling better    HPI Pamela [name] is a 69 year old female with a medical history significant for essential hypertension, history of lung adenocarcinoma, history of CHF with preserved EF, chronic kidney disease, morbid obesity, sarcoidosis, COPD, gout, and OSA presents for follow-up of chronic condition. Also, patient has been in [place], [name] for greater than 1 month.  While in [name], patient was hospitalized for COPD exacerbation.  Prior to hospital admission, patient noticed several days of progressively worsening shortness of breath and weakness.  Patient was hospitalized and treated with oral steroids.  She has been discharged and has not been using home oxygen.  Patient has followed up with pulmonology since returning.  Patient has a long history of essential hypertension.  She has been taking medications consistently and without interruption.  Serum creatinine has been increasing over the past several months and patient has been followed closely by nephrology.  Patient does not exercise but generally follows low-fat diet.     Past Medical History:  Diagnosis Date  . Arthritis   . Asthma   . Cancer ([place])    lung, adenocarcinoma right lung 2012  . CHF (congestive heart failure) (HCC)    Preserved EF  . Congenital single kidney    With chronic kidney disease  . COPD (chronic obstructive pulmonary disease) ([place])   . Gout   . Hypertension   . Lung cancer [facility]) 2012   Right upper lobe lung adenocarcinoma diagnosed with needle biopsy treated by SBRT finished treatment April 2013 has been monitored since  . Mass of chest wall, right    Right chest wall mass 7.3  cm biopsy on 12/13/2013. Patient notes it was consistent with sarcoidosis but actual pathology results not available.  . Oxygen deficiency   . Sarcoidosis   . Sickle cell trait ([place])   . Sleep apnea     Past Surgical History:  Procedure Laterality Date  . LOOP RECORDER INSERTION N/A 09/08/2018   Procedure: LOOP RECORDER INSERTION;  Surgeon: Evans Lance, MD;  Location: [facility];  Service: Cardiovascular;  Laterality: N/A;  . LUNG BIOPSY    . TEE WITHOUT CARDIOVERSION N/A 09/08/2018   Procedure: TRANSESOPHAGEAL ECHOCARDIOGRAM (TEE);  Surgeon: Josue Hector, MD;  Location: [facility] END[facility];  Service: Cardiovascular;  Laterality: N/A;  with loop  . TUBAL LIGATION    . VEIN LIGATION AND STRIPPING      Family History  Problem Relation Age of Onset  . Hypertension Mother   . Renal Disease Mother   . Cancer Father        stomach  . Heart disease Father        No details  . Cancer Brother   . Diabetes Brother   . Cervical cancer Sister   . Diabetes Sister   . Multiple myeloma Sister     Social History   Socioeconomic History  . Marital status: Divorced    Spouse name: Not on file  . Number of children: 2  . Years of education: Not on file  . Highest education level: Not on file  Occupational History  . Not on file  Tobacco  Use  . Smoking status: Never Smoker  . Smokeless tobacco: Never Used  Vaping Use  . Vaping Use: Never used  Substance and Sexual Activity  . Alcohol use: No    Alcohol/week: 0.0 standard drinks  . Drug use: No  . Sexual activity: Not Currently  Other Topics Concern  . Not on file  Social History Narrative   Lives with son.  One son is deceased.     Social Determinants of Health   Financial Resource Strain:   . Difficulty of Paying Living Expenses: Not on file  Food Insecurity:   . Worried About Charity fundraiser in the Last Year: Not on file  . Ran Out of Food in the Last Year: Not on file  Transportation Needs:   . Lack of  Transportation (Medical): Not on file  . Lack of Transportation (Non-Medical): Not on file  Physical Activity:   . Days of Exercise per Week: Not on file  . Minutes of Exercise per Session: Not on file  Stress:   . Feeling of Stress : Not on file  Social Connections:   . Frequency of Communication with Friends and Family: Not on file  . Frequency of Social Gatherings with Friends and Family: Not on file  . Attends Religious Services: Not on file  . Active Member of Clubs or Organizations: Not on file  . Attends Archivist Meetings: Not on file  . Marital Status: Not on file  Intimate Partner Violence:   . Fear of Current or Ex-Partner: Not on file  . Emotionally Abused: Not on file  . Physically Abused: Not on file  . Sexually Abused: Not on file    Outpatient Medications Prior to Visit  Medication Sig Dispense Refill  . acetaminophen (TYLENOL) 500 MG tablet Take 1,000 mg by mouth every 6 (six) hours as needed for mild pain.     [name] Kitchen albuterol (PROAIR HFA) 108 (90 Base) MCG/ACT inhaler Inhale 2 puffs into the lungs every 6 (six) hours as needed for wheezing or shortness of breath. 8 g 3  . albuterol (PROVENTIL) (2.5 MG/3ML) 0.083% nebulizer solution Take 3 mLs (2.5 mg total) by nebulization every 6 (six) hours as needed for wheezing or shortness of breath. 100 mL 2  . aspirin 325 MG tablet Take 1 tablet (325 mg total) by mouth daily. 30 tablet 0  . budesonide-formoterol (SYMBICORT) 160-4.5 MCG/ACT inhaler Inhale 2 puffs into the lungs 2 (two) times daily. 18 g 5  . calcitRIOL (ROCALTROL) 0.25 MCG capsule Take 1 capsule (0.25 mcg total) by mouth daily. 90 capsule 2  . calcium carbonate (TUMS - DOSED IN MG ELEMENTAL CALCIUM) 500 MG chewable tablet Chew 1-2 tablets by mouth daily as needed for indigestion (gas).     [name] Kitchen docusate sodium (COLACE) 100 MG capsule Take 1 capsule (100 mg total) by mouth 2 (two) times daily. 60 capsule 0  . famotidine (PEPCID) 20 MG tablet One after supper  30 tablet 11  . febuxostat (ULORIC) 40 MG tablet Take 1 tablet (40 mg total) by mouth daily. 90 tablet 1  . furosemide (LASIX) 40 MG tablet Take 1 tablet (40 mg total) by mouth 2 (two) times daily. For CHF 180 tablet 1  . magnesium oxide (MAG-OX) 400 (241.3 Mg) MG tablet Take 1 tablet (400 mg total) by mouth daily. 30 tablet 0  . metolazone (ZAROXOLYN) 2.5 MG tablet Take 1 tablet (2.5 mg total) by mouth 2 (two) times a week. 12 tablet 4  .  montelukast (SINGULAIR) 10 MG tablet Take 1 tablet (10 mg total) by mouth at bedtime. 90 tablet 1  . pantoprazole (PROTONIX) 40 MG tablet Take 30-60 min before first meal of the day 30 tablet 0  . predniSONE (DELTASONE) 10 MG tablet Take  4 each am x 2 days,   2 each am x 2 days,  1 each am x 2 days and stop 14 tablet 11  . simethicone (MYLICON) 80 MG chewable tablet Chew 1 tablet (80 mg total) by mouth 4 (four) times daily as needed for flatulence. 30 tablet 0   No facility-administered medications prior to visit.    Allergies  Allergen Reactions  . Sulfa Antibiotics Hives and Rash    ROS Review of Systems  Constitutional: Negative.   HENT: Negative.   Eyes: Negative.   Respiratory: Negative.   Cardiovascular: Negative.   Gastrointestinal: Negative.   Genitourinary: Negative.   Musculoskeletal: Negative.   Skin: Negative.   Neurological: Negative.   Hematological: Negative.   Psychiatric/Behavioral: Negative.       Objective:    Physical Exam Constitutional:      Appearance: She is obese.  Eyes:     Pupils: Pupils are equal, round, and reactive to light.  Cardiovascular:     Rate and Rhythm: Normal rate and regular rhythm.     Pulses: Normal pulses.  Pulmonary:     Effort: Pulmonary effort is normal.  Abdominal:     General: Abdomen is flat. Bowel sounds are normal.  Neurological:     General: No focal deficit present.     Mental Status: She is alert. Mental status is at baseline.  Psychiatric:        Mood and Affect: Mood  normal.        Behavior: Behavior normal.        Thought Content: Thought content normal.        Judgment: Judgment normal.     BP (!) 98/48   Pulse 66   Temp (!) 97 F (36.1 C) (Temporal)   Ht $R'5\' 6"'Gq$  (1.676 m)   Wt (!) 321 lb (145.6 kg)   SpO2 99%   BMI 51.81 kg/m  Wt Readings from Last 3 Encounters:  04/09/20 (!) 321 lb (145.6 kg)  04/01/20 (!) 330 lb (149.7 kg)  01/16/20 (!) 326 lb (147.9 kg)     There are no preventive care reminders to display for this patient.  There are no preventive care reminders to display for this patient.  Lab Results  Component Value Date   TSH 3.934 05/21/2014   Lab Results  Component Value Date   WBC 6.4 12/20/2019   HGB 12.0 12/20/2019   HCT 38.3 12/20/2019   MCV 70.0 (L) 12/20/2019   PLT 171 12/20/2019   Lab Results  Component Value Date   NA 138 12/26/2019   K 3.2 (L) 01/12/2020   CHLORIDE 103 05/31/2017   CO2 30 (H) 12/26/2019   GLUCOSE 90 12/26/2019   BUN 89 (HH) 12/26/2019   CREATININE 2.37 (H) 12/26/2019   BILITOT 0.5 12/19/2019   ALKPHOS 112 12/19/2019   AST 24 12/19/2019   ALT 16 12/19/2019   PROT 6.6 12/19/2019   ALBUMIN 3.1 (L) 12/19/2019   CALCIUM 9.4 12/26/2019   ANIONGAP 15 12/20/2019   EGFR 38 (L) 05/31/2017   Lab Results  Component Value Date   CHOL 130 03/01/2019   Lab Results  Component Value Date   HDL 48 03/01/2019   Lab Results  Component Value Date   LDLCALC 61 03/01/2019   Lab Results  Component Value Date   TRIG 105 03/01/2019   Lab Results  Component Value Date   CHOLHDL 2.7 03/01/2019   Lab Results  Component Value Date   HGBA1C 6.2 (H) 12/17/2019      Assessment & Plan:   Problem List Items Addressed This Visit      Cardiovascular and Mediastinum   Essential hypertension     Other   Morbid (severe) obesity due to excess calories ([place])    Other Visit Diagnoses    Stage 3 chronic kidney disease, unspecified whether stage 3a or 3b CKD    -  Primary   Relevant Orders     Basic Metabolic Panel      1. Stage 3 chronic kidney disease, unspecified whether stage 3a or 3b CKD  - Basic Metabolic Panel - POCT urinalysis dipstick  2. Essential hypertension BP (!) 98/48   Pulse 66   Temp (!) 97 F (36.1 C) (Temporal)   Ht _0  (1.676 m)   Wt (!) 321 lb (145.6 kg)   SpO2 99%   BMI 51.81 kg/m  Blood pressure well controlled, no medications at this time.  3. Morbid (severe) obesity due to excess calories [facility]) The patient is asked to make an attempt to improve diet and exercise patterns to aid in medical management of this problem.  4. Chronic diastolic CHF (congestive heart failure) [facility]) Patient states that while hospitalized, it was recommended that she be followed by cardiology.  Patient previously had a cardiologist, but has been lost to follow-up.  Will send a new referral to cardiology. - Ambulatory referral to Cardiology  5. Urine leukocytes - Urine Culture  Follow-up: Return in about 3 months (around 07/09/2020).    [name]  APRN, MSN, FNP-C Patient [facility] [address] [place], [place] [address] [phone]

## 2020-04-10 LAB — BASIC METABOLIC PANEL
BUN/Creatinine Ratio: 33 — ABNORMAL HIGH (ref 12–28)
BUN: 64 mg/dL — ABNORMAL HIGH (ref 8–27)
CO2: 29 mmol/L (ref 20–29)
Calcium: 8.2 mg/dL — ABNORMAL LOW (ref 8.7–10.3)
Chloride: 93 mmol/L — ABNORMAL LOW (ref 96–106)
Creatinine, Ser: 1.96 mg/dL — ABNORMAL HIGH (ref 0.57–1.00)
GFR calc Af Amer: 30 mL/min/{1.73_m2} — ABNORMAL LOW (ref >59)
GFR calc non Af Amer: 26 mL/min/{1.73_m2} — ABNORMAL LOW (ref >59)
Glucose: 95 mg/dL (ref 65–99)
Potassium: 3.3 mmol/L — ABNORMAL LOW (ref 3.5–5.2)
Sodium: 139 mmol/L (ref 134–144)

## 2020-04-11 LAB — URINE CULTURE

## 2020-04-12 ENCOUNTER — Telehealth: Payer: Self-pay

## 2020-04-12 NOTE — Telephone Encounter (Signed)
Called patient discussed results will fax results to [facility] before appointment in October

## 2020-04-12 NOTE — Telephone Encounter (Signed)
-----   Message from Dorena Dew, [place] sent at 04/10/2020 12:52 PM EDT ----- Regarding: lab results Please inform patient that creatinine continues to be elevated. Follow up with nephrology as scheduled. Please fax results to [facility].   [name]  APRN, MSN, FNP-C Patient [facility] [address] [place], [place] [address] [phone]

## 2020-04-15 ENCOUNTER — Other Ambulatory Visit: Payer: Self-pay | Admitting: Family Medicine

## 2020-04-15 DIAGNOSIS — E876 Hypokalemia: Secondary | ICD-10-CM

## 2020-04-16 ENCOUNTER — Other Ambulatory Visit: Payer: Self-pay | Admitting: Family Medicine

## 2020-04-16 DIAGNOSIS — J4530 Mild persistent asthma, uncomplicated: Secondary | ICD-10-CM

## 2020-04-17 NOTE — Telephone Encounter (Signed)
Please see RX request.   

## 2020-04-18 DIAGNOSIS — H35033 Hypertensive retinopathy, bilateral: Secondary | ICD-10-CM | POA: Diagnosis not present

## 2020-04-18 DIAGNOSIS — H2513 Age-related nuclear cataract, bilateral: Secondary | ICD-10-CM | POA: Diagnosis not present

## 2020-04-18 DIAGNOSIS — R7309 Other abnormal glucose: Secondary | ICD-10-CM | POA: Diagnosis not present

## 2020-04-18 DIAGNOSIS — H25013 Cortical age-related cataract, bilateral: Secondary | ICD-10-CM | POA: Diagnosis not present

## 2020-04-26 ENCOUNTER — Ambulatory Visit (INDEPENDENT_AMBULATORY_CARE_PROVIDER_SITE_OTHER): Payer: Medicare Other | Admitting: Internal Medicine

## 2020-04-26 ENCOUNTER — Other Ambulatory Visit: Payer: Self-pay

## 2020-04-26 ENCOUNTER — Encounter: Payer: Self-pay | Admitting: Internal Medicine

## 2020-04-26 VITALS — BP 110/60 | HR 67 | Ht 66.0 in | Wt 318.0 lb

## 2020-04-26 DIAGNOSIS — I10 Essential (primary) hypertension: Secondary | ICD-10-CM | POA: Diagnosis not present

## 2020-04-26 DIAGNOSIS — I5030 Unspecified diastolic (congestive) heart failure: Secondary | ICD-10-CM

## 2020-04-26 DIAGNOSIS — D869 Sarcoidosis, unspecified: Secondary | ICD-10-CM | POA: Diagnosis not present

## 2020-04-26 MED ORDER — ASPIRIN EC 81 MG PO TBEC: 81.0000 mg | DELAYED_RELEASE_TABLET | Freq: Every day | ORAL | 11 refills | Status: AC

## 2020-04-26 NOTE — Progress Notes (Signed)
Cardiology Office Note:    Date:  04/26/2020   ID:  Pamela [name], DOB 1951/03/03, MRN [id number]  PCP:  Pamela Dew, FNP   Referring MD: Pamela Dew, FNP   [id number] follow up Consulted for the evaluation of  at the behest of Pamela Dew, FNP Prior seen br Dr. Percival [name] 2016.  History of Present Illness:    Pamela [name] is a 69 y.o. female with a hx of PFO seen on a 2/20/220 bubble study, prior stroke, lung adenocaricnoma post treatment s/p radiation, Chart Review hx of Sarcoidosis (vs subclinical), OSA not on CPAP, HTN, single kidney, and HFpEF. On Oxygen intermittently since before 2016, Morbid Obesity.  Patient has shortness of breath for a combination of issues.  Patient discharged on 40 mg PO Lasix BID and Metolazone.No chest pain, syncope.  Patient notes weight loss and improvement with her leg swelling (has prior vein stripping of R leg in 1999).  No prior biopsy diagnosis of Sarcoidosis.  No diagnosis of HB in the past.  Past Medical History:  Diagnosis Date  . Arthritis   . Asthma   . Cancer ([place])    lung, adenocarcinoma right lung 2012  . CHF (congestive heart failure) (HCC)    Preserved EF  . Congenital single kidney    With chronic kidney disease  . COPD (chronic obstructive pulmonary disease) ([place])   . Gout   . Hypertension   . Lung cancer [facility]) 2012   Right upper lobe lung adenocarcinoma diagnosed with needle biopsy treated by SBRT finished treatment April 2013 has been monitored since  . Mass of chest wall, right    Right chest wall mass 7.3 cm biopsy on 12/13/2013. Patient notes it was consistent with sarcoidosis but actual pathology results not available.  . Oxygen deficiency   . Sarcoidosis   . Sickle cell trait ([place])   . Sleep apnea     Past Surgical History:  Procedure Laterality Date  . LOOP RECORDER INSERTION N/A 09/08/2018   Procedure: LOOP RECORDER INSERTION;  Surgeon: Evans Lance, MD;  Location: [facility];   Service: Cardiovascular;  Laterality: N/A;  . LUNG BIOPSY    . TEE WITHOUT CARDIOVERSION N/A 09/08/2018   Procedure: TRANSESOPHAGEAL ECHOCARDIOGRAM (TEE);  Surgeon: Josue Hector, MD;  Location: [facility] ENDOSCOPY;  Service: Cardiovascular;  Laterality: N/A;  with loop  . TUBAL LIGATION    . VEIN LIGATION AND STRIPPING      Current Medications: Current Meds  Medication Sig  . acetaminophen (TYLENOL) 500 MG tablet Take 1,000 mg by mouth every 6 (six) hours as needed for mild pain.   [name] Kitchen albuterol (PROAIR HFA) 108 (90 Base) MCG/ACT inhaler Inhale 2 puffs into the lungs every 6 (six) hours as needed for wheezing or shortness of breath.  [name] Kitchen albuterol (PROVENTIL) (2.5 MG/3ML) 0.083% nebulizer solution Take 3 mLs (2.5 mg total) by nebulization every 6 (six) hours as needed for wheezing or shortness of breath.  . budesonide-formoterol (SYMBICORT) 160-4.5 MCG/ACT inhaler Inhale 2 puffs into the lungs 2 (two) times daily.  . calcitRIOL (ROCALTROL) 0.25 MCG capsule Take 1 capsule (0.25 mcg total) by mouth daily.  . calcium carbonate (TUMS - DOSED IN MG ELEMENTAL CALCIUM) 500 MG chewable tablet Chew 1-2 tablets by mouth daily as needed for indigestion (gas).   [name] Kitchen docusate sodium (COLACE) 100 MG capsule Take 1 capsule (100 mg total) by mouth 2 (two) times daily.  . famotidine (PEPCID) 20 MG tablet One after supper  .  febuxostat (ULORIC) 40 MG tablet Take 1 tablet (40 mg total) by mouth daily.  . furosemide (LASIX) 40 MG tablet Take 1 tablet (40 mg total) by mouth 2 (two) times daily. For CHF  . magnesium oxide (MAG-OX) 400 (241.3 Mg) MG tablet Take 1 tablet (400 mg total) by mouth daily.  . metolazone (ZAROXOLYN) 2.5 MG tablet Take 1 tablet (2.5 mg total) by mouth 2 (two) times a week.  . montelukast (SINGULAIR) 10 MG tablet TAKE 1 TABLET BY MOUTH AT BEDTIME  . pantoprazole (PROTONIX) 40 MG tablet Take 30-60 min before first meal of the day  . potassium chloride SA (KLOR-CON) 20 MEQ tablet Take 1 tablet by mouth  once daily  . simethicone (MYLICON) 80 MG chewable tablet Chew 1 tablet (80 mg total) by mouth 4 (four) times daily as needed for flatulence.  . [DISCONTINUED] aspirin 325 MG tablet Take 1 tablet (325 mg total) by mouth daily.     Allergies:   Sulfa antibiotics   Social History   Socioeconomic History  . Marital status: Divorced    Spouse name: Not on file  . Number of children: 2  . Years of education: Not on file  . Highest education level: Not on file  Occupational History  . Not on file  Tobacco Use  . Smoking status: Never Smoker  . Smokeless tobacco: Never Used  Vaping Use  . Vaping Use: Never used  Substance and Sexual Activity  . Alcohol use: No    Alcohol/week: 0.0 standard drinks  . Drug use: No  . Sexual activity: Not Currently  Other Topics Concern  . Not on file  Social History Narrative   Lives with son.  One son is deceased.     Social Determinants of Health   Financial Resource Strain:   . Difficulty of Paying Living Expenses: Not on file  Food Insecurity:   . Worried About Charity fundraiser in the Last Year: Not on file  . Ran Out of Food in the Last Year: Not on file  Transportation Needs:   . Lack of Transportation (Medical): Not on file  . Lack of Transportation (Non-Medical): Not on file  Physical Activity:   . Days of Exercise per Week: Not on file  . Minutes of Exercise per Session: Not on file  Stress:   . Feeling of Stress : Not on file  Social Connections:   . Frequency of Communication with Friends and Family: Not on file  . Frequency of Social Gatherings with Friends and Family: Not on file  . Attends Religious Services: Not on file  . Active Member of Clubs or Organizations: Not on file  . Attends Archivist Meetings: Not on file  . Marital Status: Not on file    Family History: The patient's family history includes Cancer in her brother and father; Cervical cancer in her sister; Diabetes in her brother and sister; Heart  disease in her father; Hypertension in her mother; Multiple myeloma in her sister; Renal Disease in her mother.  ROS:   Please see the history of present illness.    All other systems reviewed and are negative.  EKGs/Labs/Other Studies Reviewed:    The following studies were reviewed today:  EKG:  12/19/19 Sinus rhythm 70 LAFB.  Recent Labs: 12/17/2019: B Natriuretic Peptide 27.5 12/19/2019: ALT 16 12/20/2019: Hemoglobin 12.0; Platelets 171 12/26/2019: Magnesium 1.8 04/26/2020: BUN 61; Creatinine, Ser 2.70; NT-Pro BNP WILL FOLLOW; Potassium 3.6; Sodium 143  Recent Lipid  Panel    Component Value Date/Time   CHOL 130 03/01/2019 1136   TRIG 105 03/01/2019 1136   HDL 48 03/01/2019 1136   CHOLHDL 2.7 03/01/2019 1136   CHOLHDL 3.8 09/07/2018 0416   VLDL 21 09/07/2018 0416   LDLCALC 61 03/01/2019 1136   09/07/19 Echo Left Ventricle: The left ventricle has hyperdynamic systolic function,  with an ejection fraction of >65%. The cavity size was normal. There is no  increase in left ventricular wall thickness. Left ventricular diastolic  parameters were normal  Right Ventricle: The right ventricle has normal systolic function. The  cavity was normal. There is no increase in right ventricular wall  thickness.    Left Atrium: left atrial size was normal in size  Right Atrium: right atrial size was normal in size. Right atrial pressure  is estimated at 3 mmHg.    Interatrial Septum: No atrial level shunt detected by color flow Doppler.  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  Pericardium: There is no evidence of pericardial effusion.  Mitral Valve: The mitral valve is normal in structure. Mitral valve  regurgitation is not visualized by color flow Doppler.  Tricuspid Valve: The tricuspid valve is normal in structure. Tricuspid  valve regurgitation is trivial by color flow Doppler.  Aortic Valve: Aortic valve is probably tricuspid Aortic valve  regurgitation was not visualized  by color flow Doppler.  Pulmonic Valve: The pulmonic valve was grossly normal. Pulmonic valve  regurgitation is not visualized by color flow Doppler.  Pulmonary Artery: The pulmonary artery is not well seen.  Venous: The inferior vena cava is normal in size with greater than 50%  respiratory variability.   09/06/18  MRA FINDINGS: Both internal carotid arteries are widely patent into the brain. No siphon stenosis. The anterior and middle cerebral vessels are patent without proximal stenosis, aneurysm or vascular malformation.  Both vertebral arteries are widely patent to the basilar. No basilar stenosis. Posterior circulation branch vessels appear normal.  IMPRESSION: Normal examination. Specifically, no posterior circulation or basilar tip abnormality seen.  Reveal Linq implanted- no evidence of atrial fibrillation   Physical Exam:    VS:  BP 110/60   Pulse 67   Ht $R'5\' 6"'aE$  (1.676 m)   Wt (!) 318 lb (144.2 kg)   SpO2 99%   BMI 51.33 kg/m     Wt Readings from Last 3 Encounters:  04/26/20 (!) 318 lb (144.2 kg)  04/09/20 (!) 321 lb (145.6 kg)  04/01/20 (!) 330 lb (149.7 kg)     GEN: Morbidly obese female in  in no acute distress HEENT: Normal NECK: No JVD; No carotid bruits LYMPHATICS: No lymphadenopathy CARDIAC: RRR, no murmurs, rubs, gallops RESPIRATORY:  Clear to auscultation without rales, wheezing or rhonchi, good air movement ABDOMEN: Soft, non-tender, non-distended MUSCULOSKELETAL:  2+ edema R> L leg; No deformity  SKIN: chronic edematous skin changes NEUROLOGIC:  Alert and oriented x 3 PSYCHIATRIC:  Normal affect   ASSESSMENT:    1. Diastolic congestive heart failure, unspecified HF chronicity ([place])   2. Essential hypertension   3. Morbid obesity ([place])   4. Sarcoidosis    PLAN:    In order of problems listed above:  Heart Failure with preserved EF - NYHA class B, Stage C, improving volume status  - Will keep lasix and metolazone at current doses -  Recommend daily weights and fluid restriction of  2 L  - BP at goal; will consider aldactone at next visit. - checking BNP  and BMP  Cardiac Sarcoidosis Eval - Without extracardiac sarcoidosis - Without biopsy - Without palpitations , presyncope, or syncope. - Without  complete left or right bundle branch block; presence of unexplained pathologic Q waves in two or more leads; sustained first-, second-, or third-degree AV block; or sustained or nonsustained VT - without  regional wall motion abnormality, ventricular aneurysm, basal septal thinning, or depressed LVEF on prior echo - will defer cardiac imaging at this time  PFO and secondary stroke prevention - no hx of uncontrolled HTN, DM, HLD, will decrease ASA to 81 mg - will get loop monitor read - will discussed PFO eval on next exam  Medication Adjustments/Labs and Tests Ordered: Current medicines are reviewed at length with the patient today.  Concerns regarding medicines are outlined above.  Orders Placed This Encounter  Procedures  . Basic metabolic panel  . Pro b natriuretic peptide (BNP)   Meds ordered this encounter  Medications  . aspirin EC 81 MG tablet    Sig: Take 1 tablet (81 mg total) by mouth daily. Swallow whole.    Dispense:  30 tablet    Refill:  11    Patient Instructions  Medication Instructions:  Your physician has recommended you make the following change in your medication:   1) Decrease Aspirin to 81 mg, 1 tablet by mouth once a day  *If you need a refill on your cardiac medications before your next appointment, please call your pharmacy*  Lab Work: You will have labs drawn today: BNP/BMET  Testing/Procedures: None ordered today  Follow-Up: At [facility], you and your health needs are our priority.  As part of our continuing mission to provide you with exceptional heart care, we have created designated Provider Care Teams.  These Care Teams include your primary Cardiologist (physician) and  Advanced Practice Providers (APPs -  Physician Assistants and Nurse Practitioners) who all work together to provide you with the care you need, when you need it.  Your next appointment:   5 month(s)  The format for your next appointment:   In Person  Provider:   Rudean Haskell, MD      Signed, Werner Lean, MD  04/26/2020 4:58 PM    [place]

## 2020-04-26 NOTE — Progress Notes (Deleted)
Cardiology Office Note:   Date: 04/26/2020  ID: Pamela [name], DOB 1951-05-06, MRN [id number]  PCP: Pamela Dew, FNP  Referring MD: Pamela Dew, FNP  [id number] follow up  Consulted for the evaluation of at the behest of Pamela Dew, FNP  Prior seen br Dr. Percival Spanish 2016.   History of Present Illness:   Pamela [name] is a 69 y.o. female with a hx of PFO seen on a 2/20/220 bubble study, prior stroke, lung adenocaricnoma post treatment s/p radiation, Chart Review hx of Sarcoidosis (vs subclinical), OSA not on CPAP, HTN, single kidney, and HFpEF. On Oxygen intermittently since before 2016, Morbid Obesity.  Patient has shortness of breath for a combination of issues. Patient discharged on 40 mg PO Lasix BID and Metolazone.       Past Medical History:  Diagnosis Date  . Arthritis   . Asthma   . Cancer ([place])    lung, adenocarcinoma right lung 2012  . CHF (congestive heart failure) (H[facility])    Preserved EF  . Congenital single kidney    With chronic kidney disease  . COPD (chronic obstructive pulmonary disease) ([place])   . Gout   . Hypertension   . Lung cancer [facility]) 2012   Right upper lobe lung adenocarcinoma diagnosed with needle biopsy treated by SBRT finished treatment April 2013 has been monitored since  . Mass of chest wall, right    Right chest wall mass 7.3 cm biopsy on 12/13/2013. Patient notes it was consistent with sarcoidosis but actual pathology results not available.  . Oxygen deficiency   . Sarcoidosis   . Sickle cell trait ([place])   . Sleep apnea         Past Surgical History:  Procedure Laterality Date  . LOOP RECORDER INSERTION N/A 09/08/2018   Procedure: LOOP RECORDER INSERTION; Surgeon: Evans Lance, MD; Location: [place] [facility]; Service: Cardiovascular; Laterality: N/A;  . LUNG BIOPSY    . TEE WITHOUT CARDIOVERSION N/A 09/08/2018   Procedure: TRANSESOPHAGEAL ECHOCARDIOGRAM (TEE); Surgeon: Josue Hector, MD; Location: [facility] ENDOSCOPY; Service:  Cardiovascular; Laterality: N/A; with loop  . TUBAL LIGATION    . VEIN LIGATION AND STRIPPING     Current Medications:  Allergies as of 04/26/2020      Reactions   Sulfa Antibiotics Hives, Rash      Medication List       Accurate as of April 26, 2020  9:56 AM. If you have any questions, ask your nurse or doctor.        STOP taking these medications   aspirin 325 MG tablet Stopped by: Werner Lean, MD   predniSONE 10 MG tablet Commonly known as: DELTASONE Stopped by: Werner Lean, MD     TAKE these medications   acetaminophen 500 MG tablet Commonly known as: TYLENOL Take 1,000 mg by mouth every 6 (six) hours as needed for mild pain.   albuterol (2.5 MG/3ML) 0.083% nebulizer solution Commonly known as: PROVENTIL Take 3 mLs (2.5 mg total) by nebulization every 6 (six) hours as needed for wheezing or shortness of breath.   albuterol 108 (90 Base) MCG/ACT inhaler Commonly known as: ProAir HFA Inhale 2 puffs into the lungs every 6 (six) hours as needed for wheezing or shortness of breath.   budesonide-formoterol 160-4.5 MCG/ACT inhaler Commonly known as: Symbicort Inhale 2 puffs into the lungs 2 (two) times daily.   calcitRIOL 0.25 MCG capsule Commonly known as: ROCALTROL Take 1 capsule (0.25 mcg total) by mouth  daily.   calcium carbonate 500 MG chewable tablet Commonly known as: TUMS - dosed in mg elemental calcium Chew 1-2 tablets by mouth daily as needed for indigestion (gas).   docusate sodium 100 MG capsule Commonly known as: COLACE Take 1 capsule (100 mg total) by mouth 2 (two) times daily.   famotidine 20 MG tablet Commonly known as: Pepcid One after supper   febuxostat 40 MG tablet Commonly known as: Uloric Take 1 tablet (40 mg total) by mouth daily.   furosemide 40 MG tablet Commonly known as: LASIX Take 1 tablet (40 mg total) by mouth 2 (two) times daily. For CHF   magnesium oxide 400 (241.3 Mg) MG tablet Commonly known as:  MAG-OX Take 1 tablet (400 mg total) by mouth daily.   metolazone 2.5 MG tablet Commonly known as: ZAROXOLYN Take 1 tablet (2.5 mg total) by mouth 2 (two) times a week.   montelukast 10 MG tablet Commonly known as: SINGULAIR TAKE 1 TABLET BY MOUTH AT BEDTIME   pantoprazole 40 MG tablet Commonly known as: Protonix Take 30-60 min before first meal of the day   potassium chloride SA 20 MEQ tablet Commonly known as: KLOR-CON Take 1 tablet by mouth once daily   simethicone 80 MG chewable tablet Commonly known as: MYLICON Chew 1 tablet (80 mg total) by mouth 4 (four) times daily as needed for flatulence.        Allergies: Sulfa antibiotics  Social History        Socioeconomic History  . Marital status: Divorced    Spouse name: Not on file  . Number of children: 2  . Years of education: Not on file  . Highest education level: Not on file  Occupational History  . Not on file  Tobacco Use  . Smoking status: Never Smoker  . Smokeless tobacco: Never Used  Vaping Use  . Vaping Use: Never used  Substance and Sexual Activity  . Alcohol use: No    Alcohol/week: 0.0 standard drinks  . Drug use: No  . Sexual activity: Not Currently  Other Topics Concern  . Not on file  Social History Narrative   Lives with son. One son is deceased.    Social Determinants of Health      Financial Resource Strain:   . Difficulty of Paying Living Expenses: Not on file  Food Insecurity:   . Worried About Charity fundraiser in the Last Year: Not on file  . Ran Out of Food in the Last Year: Not on file  Transportation Needs:   . Lack of Transportation (Medical): Not on file  . Lack of Transportation (Non-Medical): Not on file  Physical Activity:   . Days of Exercise per Week: Not on file  . Minutes of Exercise per Session: Not on file  Stress:   . Feeling of Stress : Not on file  Social Connections:   . Frequency of Communication with Friends and Family: Not on file  . Frequency of  Social Gatherings with Friends and Family: Not on file  . Attends Religious Services: Not on file  . Active Member of Clubs or Organizations: Not on file  . Attends Archivist Meetings: Not on file  . Marital Status: Not on file   Family History:  The patient's family history includes Cancer in her brother and father; Cervical cancer in her sister; Diabetes in her brother and sister; Heart disease in her father; Hypertension in her mother; Multiple myeloma in her sister; Renal  Disease in her mother.   ROS:  Please see the history of present illness.  All other systems reviewed and are negative.   EKGs/Labs/Other Studies Reviewed:   The following studies were reviewed today:   12/19/19 Sinus rhythm 70 LAFB.  Recent Labs:  12/17/2019: B Natriuretic Peptide 27.5  12/19/2019: ALT 16  12/20/2019: Hemoglobin 12.0; Platelets 171  12/26/2019: Magnesium 1.8  04/09/2020: BUN 64; Creatinine, Ser 1.96; Potassium 3.3; Sodium 139   09/07/19 Echo  Left Ventricle: The left ventricle has hyperdynamic systolic function,  with an ejection fraction of >65%. The cavity size was normal. There is no  increase in left ventricular wall thickness. Left ventricular diastolic  parameters were normal  Right Ventricle: The right ventricle has normal systolic function. The  cavity was normal. There is no increase in right ventricular wall  thickness.   Left Atrium: left atrial size was normal in size  Right Atrium: right atrial size was normal in size. Right atrial pressure  is estimated at 3 mmHg.   Interatrial Septum: No atrial level shunt detected by color flow Doppler.  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  Pericardium: There is no evidence of pericardial effusion.  Mitral Valve: The mitral valve is normal in structure. Mitral valve  regurgitation is not visualized by color flow Doppler.  Tricuspid Valve: The tricuspid valve is normal in structure. Tricuspid  valve regurgitation is trivial  by color flow Doppler.  Aortic Valve: Aortic valve is probably tricuspid Aortic valve  regurgitation was not visualized by color flow Doppler.  Pulmonic Valve: The pulmonic valve was grossly normal. Pulmonic valve  regurgitation is not visualized by color flow Doppler.  Pulmonary Artery: The pulmonary artery is not well seen.  Venous: The inferior vena cava is normal in size with greater than 50%  respiratory variability.  09/06/18  MRA  FINDINGS:  Both internal carotid arteries are widely patent into the brain. No  siphon stenosis. The anterior and middle cerebral vessels are patent  without proximal stenosis, aneurysm or vascular malformation.  Both vertebral arteries are widely patent to the basilar. No basilar  stenosis. Posterior circulation branch vessels appear normal.  IMPRESSION:  Normal examination. Specifically, no posterior circulation or  basilar tip abnormality seen.  Reveal Linq implanted- no evidence of atrial fibrillation  Physical Exam:   VS: There were no vitals taken for this visit.     Wt Readings from Last 3 Encounters:  04/09/20 (!) 321 lb (145.6 kg)  04/01/20 (!) 330 lb (149.7 kg)  01/16/20 (!) 326 lb (147.9 kg)   GEN: Morbidly Obese well developed in no acute distress  HEENT: Normal  NECK: No JVD; No carotid bruits  LYMPHATICS: No lymphadenopathy  CARDIAC: RRR, no murmurs, rubs, gallops  RESPIRATORY: Clear to auscultation without rales, wheezing or rhonchi  ABDOMEN: Soft, non-tender, non-distended  MUSCULOSKELETAL: Chronic edema bilateraly, +2; No deformity  SKIN: Warm and dry  NEUROLOGIC: Alert and oriented x 3  PSYCHIATRIC: Normal affect   ASSESSMENT:     PLAN:   In order of problems listed above:  1. Heart Failure with preserved EF - NYHA class II, Stage II, euvolemic  - continue lasix 40 mg BIG and metolazone 2.5 mg - Recommend daily weights and fluid restriction of 2 L  - BP well controlled and does not meet indications for  - does not  meed CMR/PET indications for Cardiac Sarcoid eval at this time   PFO and secondary stroke prevention  - no hx of  uncontrolled HTN, DM, HLD,  - will get loop monitor read  - will discussed PFO eval on next exam   3-4 month follow up unless new symptoms or abnormal test results warranting change in plan  Would be reasonable for Virtual Follow up   Medication Adjustments/Labs and Tests Ordered:  Current medicines are reviewed at length with the patient today. Concerns regarding medicines are outlined above.  No orders of the defined types were placed in this encounter.   No orders of the defined types were placed in this encounter.   There are no Patient Instructions on file for this visit.  Signed,  Werner Lean, MD  04/26/2020 8:16 AM  [facility]

## 2020-04-26 NOTE — Patient Instructions (Signed)
Medication Instructions:  Your physician has recommended you make the following change in your medication:   1) Decrease Aspirin to 81 mg, 1 tablet by mouth once a day  *If you need a refill on your cardiac medications before your next appointment, please call your pharmacy*  Lab Work: You will have labs drawn today: BNP/BMET  Testing/Procedures: None ordered today  Follow-Up: At [facility] you and your health needs are our priority.  As part of our continuing mission to provide you with exceptional heart care, we have created designated Provider Care Teams.  These Care Teams include your primary Cardiologist (physician) and Advanced Practice Providers (APPs -  Physician Assistants and Nurse Practitioners) who all work together to provide you with the care you need, when you need it.  Your next appointment:   5 month(s)  The format for your next appointment:   In Person  Provider:   Rudean Haskell, MD

## 2020-04-27 LAB — BASIC METABOLIC PANEL
BUN/Creatinine Ratio: 23 (ref 12–28)
BUN: 61 mg/dL — ABNORMAL HIGH (ref 8–27)
CO2: 29 mmol/L (ref 20–29)
Calcium: 9.3 mg/dL (ref 8.7–10.3)
Chloride: 95 mmol/L — ABNORMAL LOW (ref 96–106)
Creatinine, Ser: 2.7 mg/dL — ABNORMAL HIGH (ref 0.57–1.00)
GFR calc Af Amer: 20 mL/min/{1.73_m2} — ABNORMAL LOW (ref >59)
GFR calc non Af Amer: 18 mL/min/{1.73_m2} — ABNORMAL LOW (ref >59)
Glucose: 92 mg/dL (ref 65–99)
Potassium: 3.6 mmol/L (ref 3.5–5.2)
Sodium: 143 mmol/L (ref 134–144)

## 2020-04-27 LAB — PRO B NATRIURETIC PEPTIDE: NT-Pro BNP: 494 pg/mL — ABNORMAL HIGH (ref 0–301)

## 2020-05-01 ENCOUNTER — Ambulatory Visit: Payer: Medicare Other | Admitting: Internal Medicine

## 2020-05-03 ENCOUNTER — Telehealth: Payer: Self-pay | Admitting: Family Medicine

## 2020-05-03 ENCOUNTER — Other Ambulatory Visit: Payer: Self-pay | Admitting: Family Medicine

## 2020-05-03 DIAGNOSIS — I639 Cerebral infarction, unspecified: Secondary | ICD-10-CM

## 2020-05-03 MED ORDER — ATORVASTATIN CALCIUM 40 MG PO TABS: 40.0000 mg | ORAL_TABLET | Freq: Every day | ORAL | 4 refills | Status: DC

## 2020-05-03 NOTE — Progress Notes (Signed)
Meds ordered this encounter  Medications  . atorvastatin (LIPITOR) 40 MG tablet    Sig: Take 1 tablet (40 mg total) by mouth daily.    Dispense:  90 tablet    Refill:  4    Order Specific Question:   Supervising Provider    Answer:   Tresa Garter [id number]

## 2020-05-06 NOTE — Telephone Encounter (Signed)
Sent to provider 

## 2020-05-07 ENCOUNTER — Other Ambulatory Visit: Payer: Self-pay | Admitting: Internal Medicine

## 2020-05-07 ENCOUNTER — Other Ambulatory Visit: Payer: Self-pay | Admitting: Family Medicine

## 2020-05-07 ENCOUNTER — Telehealth: Payer: Self-pay | Admitting: Nurse Practitioner

## 2020-05-07 ENCOUNTER — Other Ambulatory Visit: Payer: Self-pay | Admitting: Nurse Practitioner

## 2020-05-07 DIAGNOSIS — I10 Essential (primary) hypertension: Secondary | ICD-10-CM

## 2020-05-07 DIAGNOSIS — Z1231 Encounter for screening mammogram for malignant neoplasm of breast: Secondary | ICD-10-CM

## 2020-05-07 DIAGNOSIS — I5030 Unspecified diastolic (congestive) heart failure: Secondary | ICD-10-CM

## 2020-05-07 NOTE — Telephone Encounter (Signed)
-----   Message from Werner Lean, MD sent at 04/27/2020 11:33 AM EDT ----- Results: Worsened kidney function with improvement in volume status Plan: metolazone and recheck BMP in one week. Will stop   Werner Lean, MD

## 2020-05-07 NOTE — Telephone Encounter (Signed)
Reviewed advice from Dr. Gasper Sells with patient. She verbalized understanding and agreement to d/c metolazone. She is scheduled for repeat bmet on 10/26. She thanked me for the call.

## 2020-05-07 NOTE — Addendum Note (Signed)
Addended by: Emmaline Life on: 05/07/2020 03:51 PM   Modules accepted: Orders

## 2020-05-09 DIAGNOSIS — E8881 Metabolic syndrome: Secondary | ICD-10-CM | POA: Diagnosis not present

## 2020-05-09 DIAGNOSIS — Z23 Encounter for immunization: Secondary | ICD-10-CM | POA: Diagnosis not present

## 2020-05-09 DIAGNOSIS — N179 Acute kidney failure, unspecified: Secondary | ICD-10-CM | POA: Diagnosis not present

## 2020-05-09 DIAGNOSIS — I129 Hypertensive chronic kidney disease with stage 1 through stage 4 chronic kidney disease, or unspecified chronic kidney disease: Secondary | ICD-10-CM | POA: Diagnosis not present

## 2020-05-09 DIAGNOSIS — I639 Cerebral infarction, unspecified: Secondary | ICD-10-CM | POA: Diagnosis not present

## 2020-05-09 DIAGNOSIS — I5033 Acute on chronic diastolic (congestive) heart failure: Secondary | ICD-10-CM | POA: Diagnosis not present

## 2020-05-09 DIAGNOSIS — N2581 Secondary hyperparathyroidism of renal origin: Secondary | ICD-10-CM | POA: Diagnosis not present

## 2020-05-09 DIAGNOSIS — M109 Gout, unspecified: Secondary | ICD-10-CM | POA: Diagnosis not present

## 2020-05-09 DIAGNOSIS — N189 Chronic kidney disease, unspecified: Secondary | ICD-10-CM | POA: Diagnosis not present

## 2020-05-09 DIAGNOSIS — N184 Chronic kidney disease, stage 4 (severe): Secondary | ICD-10-CM | POA: Diagnosis not present

## 2020-05-09 DIAGNOSIS — J449 Chronic obstructive pulmonary disease, unspecified: Secondary | ICD-10-CM | POA: Diagnosis not present

## 2020-05-09 DIAGNOSIS — D631 Anemia in chronic kidney disease: Secondary | ICD-10-CM | POA: Diagnosis not present

## 2020-05-13 ENCOUNTER — Other Ambulatory Visit: Payer: Self-pay

## 2020-05-13 ENCOUNTER — Encounter: Payer: Self-pay | Admitting: Internal Medicine

## 2020-05-13 ENCOUNTER — Ambulatory Visit (INDEPENDENT_AMBULATORY_CARE_PROVIDER_SITE_OTHER): Payer: Medicare Other | Admitting: Internal Medicine

## 2020-05-13 DIAGNOSIS — I639 Cerebral infarction, unspecified: Secondary | ICD-10-CM | POA: Diagnosis not present

## 2020-05-13 DIAGNOSIS — J9612 Chronic respiratory failure with hypercapnia: Secondary | ICD-10-CM | POA: Diagnosis not present

## 2020-05-13 DIAGNOSIS — J9611 Chronic respiratory failure with hypoxia: Secondary | ICD-10-CM

## 2020-05-13 DIAGNOSIS — J441 Chronic obstructive pulmonary disease with (acute) exacerbation: Secondary | ICD-10-CM | POA: Diagnosis not present

## 2020-05-13 DIAGNOSIS — J45901 Unspecified asthma with (acute) exacerbation: Secondary | ICD-10-CM | POA: Diagnosis not present

## 2020-05-13 NOTE — Progress Notes (Signed)
Subjective:    Patient ID: Pamela [name], female    DOB: [birth year]  MRN: [id number]  Brief patient profile:  62  yobf never smoker with 1st asthma attack in 1990s and on maint rx since late 90's  and freq courses of prednisone=  3-4 x per year despite advair and spiriva and freq saba referred to pulmonary clinic 04/27/2014 by Dr [name] s/p CT Bx 06/25/11 > no path report epic  but per oncology = [id number] adenoca of lung >  RT only per pt RUL completed 09/2011.     History of Present Illness  04/27/2014 1st [facility] office visit/ Aarsh Fristoe   Chief Complaint  Patient presents with  . Pulmonary Consult    Referred by Dr. [name]. [name]  Pt recently moved to [place] from [name] and states needs to establish with new pulmonary MD.  She c/o DOE and cough, and states that she feels these symptoms are currently under control.    on prednisone 10 mg daily  since late July 2015 and still on freq saba and 2lpm  Already used   2puffs proair am of ov  rec Stop spiriva and advair Start dulera 100 Take 2 puffs first thing in am and then another 2 puffs about 12 hours later.  Work on inhaler technique:  relax and gently blow all the way out then take a nice smooth deep breath back in, triggering the inhaler at same time you start breathing in.  Hold for up to 5 seconds if you can.  Rinse and gargle with water when done Only use your albuterol (proair) as a rescue medication  If proair not helping, then use the neb and if needing the neb more than occastional, then take prednisone 10 mg daily  Please schedule a follow up office visit in 4 weeks, sooner if needed with all inhalers in hand for pfts on return   11/30/2014 f/u ov/Amadi Frady re: mild intermittent asthma ? Related to weather / still on acei plus on prednisone 10 mg daily for gout  Chief Complaint  Patient presents with  . Acute Visit    Pt c/o increased SOB for the past month. She is  also coughing and wheezing. Cough is prod with minimal yellow to clear sputum. She uses proair 2 x daily on average.   Also due for CT chest to f/u dx of lung ca dx March 2013  never surgery/ RT only  Onset of dry cough / wheeze was insidious/ pattern is progressively worse day >> noct with increasing  need for saba but very poor hfa  rec Stop lisinopril and start valsartan 80 mg one daily instead  Only use your albuterol as a rescue medication   Restart dulera 100 Take 2 puffs first thing in am and then another 2 puffs about 12 hours later until you only need  your rescue inhaler no more than twice weekly for cough/wheeze/ short of breath  Follow up with Dr [name] as planned and only see me if needed    06/06/2015  f/u ov/Dashana Guizar re: worse since finished pred ? One week prior to [place]  / was taking for gout  Chief Complaint  Patient presents with  . Acute Visit    Increased SOB with or without exertion for the past 4 days. She also c/o cough and wheezing. Cough is prod with clear sputum.     Was doing much better  while on prednisone and gradually worse since off  Last used proventil about 7 am prior to OV  At 1130  On symbicort but hfa quite poor > see a/p Has neb but no meds for it  rec Prednisone Take 4 for three days 3 for three days 2 for three days 1 for three days and stop Work on inhaler technique:  relax and gently blow all the way out then take a nice smooth deep breath back in, triggering the inhaler at same time you start breathing in.  Hold for up to 5 seconds if you can. Blow out thru nose. Rinse and gargle with water when done Plan A = Automatic = symbicort 80 Take 2 puffs first thing in am and then another 2 puffs about 12 hours later.  Plan B = backup  Only use your albuterol(proair)   Plan C = crisis - only use nebulizer albuterol if you try the proair and it doesn't work         02/22/2018  f/u ov/Blu Lori re: asthma / off 02 / symb/flonase / does not use med cal as rec   Chief Complaint  Patient presents with  . Follow-up    Breathing has been doing well. She states she has had cough with clear sputum for the past wk. She rarely uses albuterol inhaler or neb.   Dyspnea:  MMRC3 = can't walk 100 yards even at a slow pace at a flat grade s stopping due to sob or back Cough: not much Sleeping: < 30 degrees multiple pillows  SABA use: as above  rec Work on inhaler technique:  - late add: consider adding singulair if not well controlled      08/02/2018 acute extended ov/Dhana Totton re: sym 160 2bid / no longer flonase / was on pred 2.5 mg qod  Chief Complaint  Patient presents with  . Acute Visit    Pt c/o cough with yellow to green sputum for the past 5 days. She had fever yesterday.  She has been having to use her rescue inhaler and neb daily since her symptoms started.    states was doing fine on just symb 160  2 bid when abruptly ill x 5 d prior to OV  With st, fever, wheeze/ cough prod puruelnt sputum and fever one day prior to OV  But not on day of ov, feels better p saba  Previously not needing ventolin or neb but up to bid now -did not realize she could go to every 4 hours as needed as per the action plan she was already provided.  rec zpak  Prednisone 10 mg take  4 each am x 2 days,   2 each am x 2 days,  1 each am x 2 days and stop   Please schedule a follow up office visit in 4 weeks, sooner if needed pfts on return - add also needs feno on return     08/30/2018  f/u ov/Johnmatthew Solorio re: moderate chronic asthma maint on symb 160 2bid no prednisone listed  Chief Complaint  Patient presents with  . Follow-up    PFT's done today. Breathing has improved since the last visit. She uses her albuterol inhaler rarely.   Dyspnea:  Walks at [employer] with cane  Cough: none Sleeping:  Bed flat, 4 pillows  SABA use: none 02: no  rec - add singulair 10 mg daily due to high feno and repeat feno next ov    02/23/2019  f/u ov/Jermika Olden re:  Asthma / MO maint symb  160/singulair  Chief Complaint  Patient presents with  . Follow-up    Patient reports that her sob and cough have  been doing well.    Dyspnea:  [employer] with cane  Cough: no Sleeping: flat bed with 4 head pillows SABA use: not recently 02: none  rec No change rx  02/23/20 admit [place] and d/c 0 2  - had 3 days warning >  D/c on spiriva    04/01/2020  f/u ov/Meliss Fleek re:  Asthma / MO  Chief Complaint  Patient presents with  . Follow-up    Reports productive cough x4 days.  Dyspnea:  [employer] with cane with or without 02  Cough: since 9/9 clear mucus Sleeping: flat bed with with 3 pillows  SABA use: neither right now  02: prn  rec Plan A = Automatic = Always=    Symbicort 160 Take 2 puffs first thing in am and then another 2 puffs about 12 hours later.  And singulair at night and stop spiriva  Work on inhaler technique:   Plan B = Backup (to supplement plan A, not to replace it) Only use your albuterol inhaler (puffer) as a rescue medication  Plan C = Crisis (instead of Plan B but only if Plan B stops working) - only use your albuterol nebulizer if you first try Plan B and it fails to help > ok to use the nebulizer up to every 4 hours but if start needing it regularly call for immediate appointment Plan D = Deltasone  - call me if B and C not adequate > add prednisone Prednisone 10 mg take  4 each am x 2 days,   2 each am x 2 days,  1 each am x 2 days and stop (refillable)  Plan E = ER - go to ER or call 911 if all else fails   Make sure you check your oxygen saturations at highest level  Pantoprazole (protonix) 40 mg   Take  30-60 min before first meal of the day and Pepcid (famotidine)  20 mg one @  bedtime until return to office - this is the best way to tell whether stomach acid is contributing to your problem.   GERD diet   05/13/2020  f/u ov/Louella Medaglia re:  Asthma / MO/ maint on symbicort 160/ singulair though hfa poor  Chief Complaint  Patient presents with  . Follow-up     Denies any problems with breathing   Dyspnea:  [employer] with cane  - walks with sats always above 90% Cough: none  Sleeping: flat bed, 3 pillows "like a baby" SABA use: symbicort 80 2bid / not needing saba at all/ no need for prednisone 02: none at all since [name] trip    No obvious day to day or daytime variability or assoc excess/ purulent sputum or mucus plugs or hemoptysis or cp or chest tightness, subjective wheeze or overt sinus or hb symptoms.   Sleeping as above without nocturnal  or early am exacerbation  of respiratory  c/o's or need for noct saba. Also denies any obvious fluctuation of symptoms with weather or environmental changes or other aggravating or alleviating factors except as outlined above   No unusual exposure hx or h/o childhood pna/ asthma or knowledge of premature birth.  Current Allergies, Complete Past Medical History, Past Surgical History, Family History, and Social History were reviewed in [employer] record.  ROS  The following are not active complaints unless  bolded Hoarseness, sore throat, dysphagia, dental problems, itching, sneezing,  nasal congestion or discharge of excess mucus or purulent secretions, ear ache,   fever, chills, sweats, unintended wt loss or wt gain, classically pleuritic or exertional cp,  orthopnea pnd or arm/hand swelling  or leg swelling, presyncope, palpitations, abdominal pain, anorexia, nausea, vomiting, diarrhea  or change in bowel habits or change in bladder habits, change in stools or change in urine, dysuria, hematuria,  rash, arthralgias, visual complaints, headache, numbness, weakness or ataxia or problems with walking or coordination,  change in mood or  memory.        Current Meds  Medication Sig  . acetaminophen (TYLENOL) 500 MG tablet Take 1,000 mg by mouth every 6 (six) hours as needed for mild pain.   [name] Kitchen albuterol (PROAIR HFA) 108 (90 Base) MCG/ACT inhaler Inhale 2 puffs into the lungs every 6 (six)  hours as needed for wheezing or shortness of breath.  [name] Kitchen albuterol (PROVENTIL) (2.5 MG/3ML) 0.083% nebulizer solution Take 3 mLs (2.5 mg total) by nebulization every 6 (six) hours as needed for wheezing or shortness of breath.  [name] Kitchen aspirin EC 81 MG tablet Take 1 tablet (81 mg total) by mouth daily. Swallow whole.  [name] Kitchen atorvastatin (LIPITOR) 40 MG tablet Take 1 tablet (40 mg total) by mouth daily.  . budesonide-formoterol (SYMBICORT) 160-4.5 MCG/ACT inhaler Inhale 2 puffs into the lungs 2 (two) times daily.  . calcitRIOL (ROCALTROL) 0.25 MCG capsule Take 1 capsule (0.25 mcg total) by mouth daily.  . calcium carbonate (TUMS - DOSED IN MG ELEMENTAL CALCIUM) 500 MG chewable tablet Chew 1-2 tablets by mouth daily as needed for indigestion (gas).   [name] Kitchen docusate sodium (COLACE) 100 MG capsule Take 1 capsule (100 mg total) by mouth 2 (two) times daily.  . famotidine (PEPCID) 20 MG tablet One after supper  . febuxostat (ULORIC) 40 MG tablet Take 1 tablet (40 mg total) by mouth daily.  . furosemide (LASIX) 40 MG tablet Take 1 tablet (40 mg total) by mouth 2 (two) times daily. For CHF  . magnesium oxide (MAG-OX) 400 (241.3 Mg) MG tablet Take 1 tablet (400 mg total) by mouth daily.  . montelukast (SINGULAIR) 10 MG tablet TAKE 1 TABLET BY MOUTH AT BEDTIME  . pantoprazole (PROTONIX) 40 MG tablet TAKE 1 TABLET BY MOUTH 30-60 MINUTES ONCE DAILY(FIRST MEAL OF THE DAY)  . potassium chloride SA (KLOR-CON) 20 MEQ tablet Take 1 tablet by mouth once daily                     Objective:   Physical Exam   05/13/2020 325  04/01/2020  330   02/23/2019    354   08/30/2018  366   08/02/2018  360   11/30/2014   343        Pleasant amb obese bf nad    HEENT : pt wearing mask not removed for exam due to covid -19 concerns.    NECK :  without JVD/Nodes/TM/ nl carotid upstrokes bilaterally   LUNGS: no acc muscle use,  Nl contour chest which is clear to A and P bilaterally without cough on insp or exp  maneuvers   CV:  RRR  no s3 or murmur or increase in P2, and pitting edema both LE's ("x > 10 years"  ABD:  Obese soft and nontender with nl inspiratory excursion in the supine position. No bruits or organomegaly appreciated, bowel sounds nl  MS:  Walks with cane/ ext warm without  deformities, calf tenderness, cyanosis or clubbing No obvious joint restrictions   SKIN: warm and dry with elephantiasis changes both LE's  NEURO:  alert, approp, nl sensorium with  no motor or cerebellar deficits apparent.

## 2020-05-13 NOTE — Assessment & Plan Note (Addendum)
Complicated by hbp/ pre-dm  Body mass index is 52.49 kg/m.  -  trending down slightly, encouraged Lab Results  Component Value Date   TSH 3.934 05/21/2014     Contributing to gerd risk/ doe/reviewed the need and the process to achieve and maintain neg calorie balance > defer f/u primary care including intermittently monitoring thyroid status           Each maintenance medication was reviewed in detail including emphasizing most importantly the difference between maintenance and prns and under what circumstances the prns are to be triggered using an action plan format where appropriate.  Total time for H and P, chart review, counseling, teaching device and generating customized AVS unique to this office visit / charting = 56min

## 2020-05-13 NOTE — Assessment & Plan Note (Addendum)
Started 2lpm 24/7 in [place] around 2013  See pfts 07/19/2014 with completely reversible airflow obst (so this is not copd) and a dlco of 71% so this is not ILD - HC03   05/26/16  = 34 - HC03    02/06/17  = 27   As of 05/13/2020  0k to stop 02 effective today

## 2020-05-13 NOTE — Assessment & Plan Note (Addendum)
Onset asthma 1990's while living in [name]  pfts 07/31/13  FEV1  1.53 (58%) with ratio 66 > p saba ratio 74 FEV1 1.68 (64%)  - -04/27/2014  try dulera 100 2 bid  - PFT's 07/19/2014 FEV1 2.00 (93%) and ratio 85 after 21% improvement from saba with no inhalers x one week  - trial off acei 12/01/2014 due to pseudoasthma component > resolved  - Spirometry 06/01/2016  FEV1 1.06 (48%)  Ratio 61  - FENO 06/01/2016  =   85 on symbicort 80 x2 > increase to 160  - Prednisone 10 mg floor x years, rec increase  to 20 mg if needing neb  - 10/15/2016 reduced pred to 5 mg daily (gout flares with taper)  - Spirometry 02/22/2018  FEV1 1.23 (57%)  Ratio 71 with min curvature  - FENO 02/22/2018  = 61 08/02/2018  After extensive coaching inhaler device,  effectiveness =    90%  FENO 08/30/2018  =  57 p symb 160 2bid > rec add singulair 10 mg daily  PFT's  08/30/2018  FEV1 1.69 (78 % ) ratio 0.79  p 54 % improvement from saba p symb 160 x2  prior to study with DLCO  112 % corrects to 154  % for alv volume   - 04/01/2020  Added pred x 6 days as Plan d  - 05/13/2020  After extensive coaching inhaler device,  effectiveness =    25%   Despite very poor hfa technique, All goals of chronic asthma control met including optimal function and elimination of symptoms with minimal need for rescue therapy.  Contingencies discussed in full including contacting this office immediately if not controlling the symptoms using the rule of two's.     Rec: Ok to try to wean off ppi in favor of pepcid prn > f/u pcp/gi prn  Work on hfa using empty symbicort device as trainer ABCD plan reviewed F/u @ 6 m, sooner prn

## 2020-05-13 NOTE — Patient Instructions (Signed)
Stop pantoprazole and change famotidine twice daily after bfast and after supper for full week then just the dose after supper for a week then stop - if you have any obvious problems start back on previous   GERD (REFLUX)  is an extremely common cause of respiratory symptoms just like yours , many times with no obvious heartburn at all.    It can be treated with medication, but also with lifestyle changes including elevation of the head of your bed (ideally with 6 -8inch blocks under the headboard of your bed),  Smoking cessation, avoidance of late meals, excessive alcohol, and avoid fatty foods, chocolate, peppermint, colas, red wine, and acidic juices such as orange juice.  NO MINT OR MENTHOL PRODUCTS SO NO COUGH DROPS  USE SUGARLESS CANDY INSTEAD (Jolley ranchers or Stover's or Life Savers) or even ice chips will also do - the key is to swallow to prevent all throat clearing. NO OIL BASED VITAMINS - use powdered substitutes.  Avoid fish oil when coughing.    Ok to stop the oxygen  Please schedule a follow up visit in 6 months but call sooner if needed

## 2020-05-14 ENCOUNTER — Other Ambulatory Visit: Payer: Medicare Other

## 2020-05-20 ENCOUNTER — Telehealth: Payer: Self-pay | Admitting: Family Medicine

## 2020-05-20 NOTE — Telephone Encounter (Signed)
Made a Virtual appointment for Pt

## 2020-05-21 ENCOUNTER — Telehealth (INDEPENDENT_AMBULATORY_CARE_PROVIDER_SITE_OTHER): Payer: Medicare Other | Admitting: Family Medicine

## 2020-05-21 DIAGNOSIS — I639 Cerebral infarction, unspecified: Secondary | ICD-10-CM | POA: Diagnosis not present

## 2020-05-21 DIAGNOSIS — G8929 Other chronic pain: Secondary | ICD-10-CM

## 2020-05-21 DIAGNOSIS — M25512 Pain in left shoulder: Secondary | ICD-10-CM

## 2020-05-21 NOTE — Progress Notes (Signed)
Virtual Visit via Telephone Note  I connected with Pamela [name] on 05/21/20 at  9:40 AM EDT by telephone and verified that I am speaking with the correct person using two identifiers.  Location: Patient: Home Provider: [facility]    I discussed the limitations, risks, security and privacy concerns of performing an evaluation and management service by telephone and the availability of in person appointments. I also discussed with the patient that there may be a patient responsible charge related to this service. The patient expressed understanding and agreed to proceed.  Chief Complaint  Patient presents with  . Shoulder Pain    History of Present Illness: Pamela [name] is a very pleasant 69 year old female with a medical history significant for essential hypertension, stage III chronic kidney disease, history of adenocarcinoma of right lung, history of CHF, prediabetes, and history of COPD presents with complaints of left shoulder pain.  Patient has been experiencing this pain for well over 3 months.  She denies any injury or falls.  Patient characterizes pain as intermittent and aching.  Patient says that pain is worsened when lifting left arm or when lifting heavy objects.  Pain is somewhat relieved with Tylenol and Voltaren gel.  She currently denies any headache, chest pain, urinary symptoms, nausea, vomiting, or diarrhea.   Past Medical History:  Diagnosis Date  . Arthritis   . Asthma   . Cancer ([facility])    lung, adenocarcinoma right lung 2012  . CHF (congestive heart failure) ([facility])    Preserved EF  . Congenital single kidney    With chronic kidney disease  . COPD (chronic obstructive pulmonary disease) ([facility])   . Gout   . Hypertension   . Lung cancer [facility]) 2012   Right upper lobe lung adenocarcinoma diagnosed with needle biopsy treated by SBRT finished treatment April 2013 has been monitored since  . Mass of chest wall, right    Right chest wall mass 7.3  cm biopsy on 12/13/2013. Patient notes it was consistent with sarcoidosis but actual pathology results not available.  . Oxygen deficiency   . Sarcoidosis   . Sickle cell trait ([facility])   . Sleep apnea    Social History   Socioeconomic History  . Marital status: Divorced    Spouse name: Not on file  . Number of children: 2  . Years of education: Not on file  . Highest education level: Not on file  Occupational History  . Not on file  Tobacco Use  . Smoking status: Never Smoker  . Smokeless tobacco: Never Used  Vaping Use  . Vaping Use: Never used  Substance and Sexual Activity  . Alcohol use: No    Alcohol/week: 0.0 standard drinks  . Drug use: No  . Sexual activity: Not Currently  Other Topics Concern  . Not on file  Social History Narrative   Lives with son.  One son is deceased.     Social Determinants of Health   Financial Resource Strain:   . Difficulty of Paying Living Expenses: Not on file  Food Insecurity:   . Worried About Charity fundraiser in the Last Year: Not on file  . Ran Out of Food in the Last Year: Not on file  Transportation Needs:   . Lack of Transportation (Medical): Not on file  . Lack of Transportation (Non-Medical): Not on file  Physical Activity:   . Days of Exercise per Week: Not on file  . Minutes of Exercise per Session:  Not on file  Stress:   . Feeling of Stress : Not on file  Social Connections:   . Frequency of Communication with Friends and Family: Not on file  . Frequency of Social Gatherings with Friends and Family: Not on file  . Attends Religious Services: Not on file  . Active Member of Clubs or Organizations: Not on file  . Attends Archivist Meetings: Not on file  . Marital Status: Not on file  Intimate Partner Violence:   . Fear of Current or Ex-Partner: Not on file  . Emotionally Abused: Not on file  . Physically Abused: Not on file  . Sexually Abused: Not on file   Allergies  Allergen Reactions  . Sulfa  Antibiotics Hives and Rash   Immunization History  Administered Date(s) Administered  . Influenza,inj,Quad PF,6+ Mos 04/13/2014, 04/16/2015, 04/03/2016, 05/19/2017, 06/15/2018, 05/14/2019  . PFIZER SARS-COV-2 Vaccination 10/14/2019, 11/08/2019  . Pneumococcal Conjugate-13 04/03/2016  . Pneumococcal Polysaccharide-23 04/13/2014, 12/26/2019  . Tdap 01/02/2015  . Zoster 05/21/2014      Assessment and Plan:  Chronic left shoulder pain Patient advised to continue Tylenol 500 mg every 6 hours as needed for mild to moderate pain. May warrant physical therapy going forward.  We will start with left shoulder x-ray.  Review results as they become available.  Also, continue Voltaren gel as directed. - DG Shoulder Left; Future  Follow Up Instructions:    I discussed the assessment and treatment plan with the patient. The patient was provided an opportunity to ask questions and all were answered. The patient agreed with the plan and demonstrated an understanding of the instructions.   The patient was advised to call back or seek an in-person evaluation if the symptoms worsen or if the condition fails to improve as anticipated.  I provided 10 minutes of non-face-to-face time during this encounter.   [name]  APRN, MSN, FNP-C Patient [facility] [address] [place], [place] [address] [phone]

## 2020-05-28 ENCOUNTER — Other Ambulatory Visit: Payer: Self-pay

## 2020-05-28 ENCOUNTER — Ambulatory Visit (HOSPITAL_COMMUNITY)
Admission: RE | Admit: 2020-05-28 | Discharge: 2020-05-28 | Disposition: A | Discharge status: Home / Self Care | Payer: Medicare Other | Source: Ambulatory Visit | Attending: Family Medicine | Admitting: Family Medicine

## 2020-05-28 DIAGNOSIS — M25512 Pain in left shoulder: Secondary | ICD-10-CM | POA: Diagnosis not present

## 2020-05-28 DIAGNOSIS — G8929 Other chronic pain: Secondary | ICD-10-CM

## 2020-05-28 DIAGNOSIS — M19012 Primary osteoarthritis, left shoulder: Secondary | ICD-10-CM | POA: Diagnosis not present

## 2020-05-31 ENCOUNTER — Telehealth (HOSPITAL_COMMUNITY): Payer: Self-pay | Admitting: Family Medicine

## 2020-05-31 NOTE — Telephone Encounter (Signed)
Pamela [name] is a 69 year old female with ongoing complaints of left shoulder pain.  Patient underwent left shoulder x-ray, which is consistent with osteoarthritis.  Patient advised to schedule appointment with orthopedic services.  Also, recommended Tylenol 500 mg every 4 hours as needed for mild to moderate pain.  Patient advised to continue to avoid all nephrotoxins due to chronic kidney disease.  Patient to follow-up in office as scheduled.  [name]  APRN, MSN, FNP-C Patient [facility] [address] [place], [place] [address] [phone]

## 2020-06-05 ENCOUNTER — Ambulatory Visit
Admission: RE | Admit: 2020-06-05 | Discharge: 2020-06-05 | Disposition: A | Discharge status: Home / Self Care | Payer: Medicare Other | Source: Ambulatory Visit | Attending: Family Medicine | Admitting: Family Medicine

## 2020-06-05 ENCOUNTER — Other Ambulatory Visit: Payer: Self-pay

## 2020-06-05 DIAGNOSIS — Z1231 Encounter for screening mammogram for malignant neoplasm of breast: Secondary | ICD-10-CM

## 2020-06-17 ENCOUNTER — Telehealth: Payer: Self-pay | Admitting: Internal Medicine

## 2020-06-17 ENCOUNTER — Ambulatory Visit (INDEPENDENT_AMBULATORY_CARE_PROVIDER_SITE_OTHER): Payer: Medicare Other

## 2020-06-17 DIAGNOSIS — I639 Cerebral infarction, unspecified: Secondary | ICD-10-CM | POA: Diagnosis not present

## 2020-06-17 NOTE — Telephone Encounter (Signed)
Notes received from [facility]:  Creatinine down to 2.45 no Continued on PO Iron.  Gained 3 lbs attributed to dietary indiscretion; no change in diuretics per our nephrology teammates.  Will re-assess volume status at next visit.  Werner Lean, MD

## 2020-06-19 ENCOUNTER — Telehealth: Payer: Self-pay | Admitting: Internal Medicine

## 2020-06-19 NOTE — Telephone Encounter (Signed)
  1. Has your device fired?  no  2. Is you device beeping? no  3. Are you experiencing draining or swelling at device site?  no 4. Are you calling to see if we received your device transmission? Yes, patient received a letter in the mail stating we didn't get her transmission   5. Have you passed out?  no  Please route to [place]

## 2020-06-24 ENCOUNTER — Other Ambulatory Visit: Payer: Self-pay | Admitting: Family Medicine

## 2020-06-24 ENCOUNTER — Telehealth: Payer: Self-pay | Admitting: Family Medicine

## 2020-06-24 DIAGNOSIS — I5030 Unspecified diastolic (congestive) heart failure: Secondary | ICD-10-CM

## 2020-06-24 LAB — CUP PACEART REMOTE DEVICE CHECK
Date Time Interrogation Session: 20211204225855
Implantable Pulse Generator Implant Date: 20200220

## 2020-06-24 MED ORDER — FUROSEMIDE 40 MG PO TABS: 40.0000 mg | ORAL_TABLET | Freq: Two times a day (BID) | ORAL | 1 refills | Status: DC

## 2020-06-24 NOTE — Progress Notes (Signed)
Meds ordered this encounter  Medications  . furosemide (LASIX) 40 MG tablet    Sig: Take 1 tablet (40 mg total) by mouth 2 (two) times daily. For CHF    Dispense:  180 tablet    Refill:  1    Order Specific Question:   Supervising Provider    Answer:   Tresa Garter [[id number]]   [name]  APRN, MSN, FNP-C Patient [facility] [address] [place], [place] [address] [phone]

## 2020-06-25 NOTE — Telephone Encounter (Signed)
Done

## 2020-06-26 NOTE — Telephone Encounter (Signed)
I let the patient know that her monitor is up to date.

## 2020-06-27 DIAGNOSIS — I10 Essential (primary) hypertension: Secondary | ICD-10-CM | POA: Diagnosis not present

## 2020-06-27 DIAGNOSIS — J45901 Unspecified asthma with (acute) exacerbation: Secondary | ICD-10-CM | POA: Diagnosis not present

## 2020-06-27 DIAGNOSIS — M069 Rheumatoid arthritis, unspecified: Secondary | ICD-10-CM | POA: Diagnosis not present

## 2020-06-27 DIAGNOSIS — H579 Unspecified disorder of eye and adnexa: Secondary | ICD-10-CM | POA: Diagnosis not present

## 2020-06-27 DIAGNOSIS — J449 Chronic obstructive pulmonary disease, unspecified: Secondary | ICD-10-CM | POA: Diagnosis not present

## 2020-06-27 DIAGNOSIS — M1380 Other specified arthritis, unspecified site: Secondary | ICD-10-CM | POA: Diagnosis not present

## 2020-06-27 DIAGNOSIS — Z0000 Encounter for general adult medical examination without abnormal findings: Secondary | ICD-10-CM | POA: Diagnosis not present

## 2020-06-27 DIAGNOSIS — E669 Obesity, unspecified: Secondary | ICD-10-CM | POA: Diagnosis not present

## 2020-06-27 DIAGNOSIS — Z79899 Other long term (current) drug therapy: Secondary | ICD-10-CM | POA: Diagnosis not present

## 2020-06-27 DIAGNOSIS — I639 Cerebral infarction, unspecified: Secondary | ICD-10-CM | POA: Diagnosis not present

## 2020-06-27 DIAGNOSIS — Z6841 Body Mass Index (BMI) 40.0 and over, adult: Secondary | ICD-10-CM | POA: Diagnosis not present

## 2020-06-27 DIAGNOSIS — I6300 Cerebral infarction due to thrombosis of unspecified precerebral artery: Secondary | ICD-10-CM | POA: Diagnosis not present

## 2020-07-01 NOTE — Progress Notes (Signed)
[facility] Summary Report / Loop Recorder 

## 2020-07-04 ENCOUNTER — Encounter: Payer: Self-pay | Admitting: Podiatry

## 2020-07-04 ENCOUNTER — Ambulatory Visit (INDEPENDENT_AMBULATORY_CARE_PROVIDER_SITE_OTHER): Payer: Medicare Other | Admitting: Podiatry

## 2020-07-04 ENCOUNTER — Other Ambulatory Visit: Payer: Self-pay

## 2020-07-04 DIAGNOSIS — M79674 Pain in right toe(s): Secondary | ICD-10-CM | POA: Diagnosis not present

## 2020-07-04 DIAGNOSIS — B351 Tinea unguium: Secondary | ICD-10-CM | POA: Diagnosis not present

## 2020-07-04 DIAGNOSIS — M79675 Pain in left toe(s): Secondary | ICD-10-CM | POA: Diagnosis not present

## 2020-07-04 NOTE — Progress Notes (Signed)
Subjective: 69 y.o. returns the office today for painful, elongated, thickened toenails which she cannot trim herself. Denies any redness or drainage around the nails. D her heels been doing well since the injections.  Enies any acute changes since last appointment and no new complaints today. Denies any systemic complaints such as fevers, chills, nausea, vomiting.   Objective: AAO 3, NAD DP/PT pulses palpable, CRT less than 3 seconds Nails hypertrophic, dystrophic, elongated, brittle, discolored 10. There is tenderness overlying the nails 1-5 bilaterally. There is no surrounding erythema or drainage along the nail sites. No open lesions or pre-ulcerative lesions are identified. No other areas of tenderness bilateral lower extremities. No overlying edema, erythema, increased warmth. No pain with calf compression, swelling, warmth, erythema.  Assessment: Patient presents with symptomatic onychomycosis  Plan: -Treatment options including alternatives, risks, complications were discussed -Nails sharply debrided 10 without complication/bleeding. -Discussed daily foot inspection. If there are any changes, to call the office immediately.  -Follow-up in 3 months or sooner if any problems are to arise. In the meantime, encouraged to call the office with any questions, concerns, changes symptoms.  Celesta Gentile, DPM

## 2020-07-08 ENCOUNTER — Telehealth: Payer: Self-pay | Admitting: Family Medicine

## 2020-07-08 DIAGNOSIS — Z23 Encounter for immunization: Secondary | ICD-10-CM | POA: Diagnosis not present

## 2020-07-08 NOTE — Telephone Encounter (Signed)
Sent to provider 

## 2020-07-09 ENCOUNTER — Ambulatory Visit: Payer: Medicare Other | Admitting: Family Medicine

## 2020-07-22 ENCOUNTER — Ambulatory Visit (INDEPENDENT_AMBULATORY_CARE_PROVIDER_SITE_OTHER): Payer: Medicare Other

## 2020-07-22 DIAGNOSIS — I639 Cerebral infarction, unspecified: Secondary | ICD-10-CM | POA: Diagnosis not present

## 2020-07-23 LAB — CUP PACEART REMOTE DEVICE CHECK
Date Time Interrogation Session: 20220102232453
Implantable Pulse Generator Implant Date: 20200220

## 2020-07-31 DIAGNOSIS — N184 Chronic kidney disease, stage 4 (severe): Secondary | ICD-10-CM | POA: Diagnosis not present

## 2020-07-31 DIAGNOSIS — N2581 Secondary hyperparathyroidism of renal origin: Secondary | ICD-10-CM | POA: Diagnosis not present

## 2020-07-31 DIAGNOSIS — N189 Chronic kidney disease, unspecified: Secondary | ICD-10-CM | POA: Diagnosis not present

## 2020-07-31 DIAGNOSIS — D869 Sarcoidosis, unspecified: Secondary | ICD-10-CM | POA: Diagnosis not present

## 2020-07-31 DIAGNOSIS — D631 Anemia in chronic kidney disease: Secondary | ICD-10-CM | POA: Diagnosis not present

## 2020-07-31 DIAGNOSIS — I5033 Acute on chronic diastolic (congestive) heart failure: Secondary | ICD-10-CM | POA: Diagnosis not present

## 2020-07-31 DIAGNOSIS — I129 Hypertensive chronic kidney disease with stage 1 through stage 4 chronic kidney disease, or unspecified chronic kidney disease: Secondary | ICD-10-CM | POA: Diagnosis not present

## 2020-08-06 NOTE — Progress Notes (Signed)
[facility] Summary Report / Loop Recorder 

## 2020-08-15 ENCOUNTER — Other Ambulatory Visit: Payer: Self-pay | Admitting: Family Medicine

## 2020-08-15 ENCOUNTER — Telehealth: Payer: Self-pay | Admitting: Family Medicine

## 2020-08-15 DIAGNOSIS — M109 Gout, unspecified: Secondary | ICD-10-CM

## 2020-08-15 DIAGNOSIS — J4530 Mild persistent asthma, uncomplicated: Secondary | ICD-10-CM

## 2020-08-15 MED ORDER — FEBUXOSTAT 40 MG PO TABS: 40.0000 mg | ORAL_TABLET | Freq: Every day | ORAL | 1 refills | Status: DC

## 2020-08-15 NOTE — Telephone Encounter (Signed)
Please see refill request.

## 2020-08-15 NOTE — Progress Notes (Signed)
Meds ordered this encounter  Medications  . febuxostat (ULORIC) 40 MG tablet    Sig: Take 1 tablet (40 mg total) by mouth daily.    Dispense:  90 tablet    Refill:  1    Order Specific Question:   Supervising Provider    Answer:   Tresa Garter [[id number]]     [name]  APRN, MSN, FNP-C [facility] [address] [place], [place] [address] [phone]

## 2020-08-16 NOTE — Telephone Encounter (Signed)
done

## 2020-08-23 ENCOUNTER — Other Ambulatory Visit: Payer: Self-pay | Admitting: Family Medicine

## 2020-08-23 ENCOUNTER — Telehealth: Payer: Self-pay | Admitting: Family Medicine

## 2020-08-23 DIAGNOSIS — M109 Gout, unspecified: Secondary | ICD-10-CM

## 2020-08-23 MED ORDER — FEBUXOSTAT 40 MG PO TABS: 40.0000 mg | ORAL_TABLET | Freq: Every day | ORAL | 1 refills | Status: DC

## 2020-08-23 NOTE — Telephone Encounter (Signed)
done

## 2020-08-23 NOTE — Progress Notes (Signed)
No orders of the defined types were placed in this encounter.  Meds ordered this encounter  Medications   febuxostat (ULORIC) 40 MG tablet    Sig: Take 1 tablet (40 mg total) by mouth daily.    Dispense:  90 tablet    Refill:  1    Order Specific Question:   Supervising Provider    Answer:   Tresa Garter [id number]

## 2020-08-23 NOTE — Telephone Encounter (Signed)
error 

## 2020-08-26 ENCOUNTER — Ambulatory Visit (INDEPENDENT_AMBULATORY_CARE_PROVIDER_SITE_OTHER): Payer: Medicare Other

## 2020-08-26 DIAGNOSIS — I639 Cerebral infarction, unspecified: Secondary | ICD-10-CM

## 2020-08-27 LAB — CUP PACEART REMOTE DEVICE CHECK
Date Time Interrogation Session: 20220204232025
Implantable Pulse Generator Implant Date: 20200220

## 2020-09-02 NOTE — Progress Notes (Signed)
[facility] Summary Report / Loop Recorder 

## 2020-09-10 ENCOUNTER — Ambulatory Visit: Payer: Medicare Other | Admitting: Family Medicine

## 2020-09-13 ENCOUNTER — Other Ambulatory Visit: Payer: Self-pay | Admitting: Internal Medicine

## 2020-09-24 ENCOUNTER — Other Ambulatory Visit: Payer: Self-pay

## 2020-09-24 ENCOUNTER — Encounter: Payer: Self-pay | Admitting: Internal Medicine

## 2020-09-24 ENCOUNTER — Ambulatory Visit: Payer: Medicare Other | Admitting: Internal Medicine

## 2020-09-24 ENCOUNTER — Ambulatory Visit (INDEPENDENT_AMBULATORY_CARE_PROVIDER_SITE_OTHER): Payer: Medicare Other | Admitting: Internal Medicine

## 2020-09-24 VITALS — BP 120/68 | HR 68 | Ht 67.0 in | Wt 341.0 lb

## 2020-09-24 DIAGNOSIS — Q2112 Patent foramen ovale: Secondary | ICD-10-CM

## 2020-09-24 DIAGNOSIS — D869 Sarcoidosis, unspecified: Secondary | ICD-10-CM

## 2020-09-24 DIAGNOSIS — N1832 Chronic kidney disease, stage 3b: Secondary | ICD-10-CM | POA: Diagnosis not present

## 2020-09-24 DIAGNOSIS — Q211 Atrial septal defect: Secondary | ICD-10-CM

## 2020-09-24 DIAGNOSIS — Z8673 Personal history of transient ischemic attack (TIA), and cerebral infarction without residual deficits: Secondary | ICD-10-CM | POA: Diagnosis not present

## 2020-09-24 DIAGNOSIS — C3490 Malignant neoplasm of unspecified part of unspecified bronchus or lung: Secondary | ICD-10-CM

## 2020-09-24 DIAGNOSIS — I5032 Chronic diastolic (congestive) heart failure: Secondary | ICD-10-CM

## 2020-09-24 DIAGNOSIS — Z85118 Personal history of other malignant neoplasm of bronchus and lung: Secondary | ICD-10-CM

## 2020-09-24 DIAGNOSIS — I10 Essential (primary) hypertension: Secondary | ICD-10-CM | POA: Diagnosis not present

## 2020-09-24 DIAGNOSIS — I700 Atherosclerosis of aorta: Secondary | ICD-10-CM | POA: Insufficient documentation

## 2020-09-24 NOTE — Patient Instructions (Signed)
Medication Instructions:  Your physician recommends that you continue on your current medications as directed. Please refer to the Current Medication list given to you today.  *If you need a refill on your cardiac medications before your next appointment, please call your pharmacy*   Lab Work: NONE If you have labs (blood work) drawn today and your tests are completely normal, you will receive your results only by: [name] Kitchen MyChart Message (if you have MyChart) OR . A paper copy in the mail If you have any lab test that is abnormal or we need to change your treatment, we will call you to review the results.   Testing/Procedures: NONE   Follow-Up: At [facility] you and your health needs are our priority.  As part of our continuing mission to provide you with exceptional heart care, we have created designated Provider Care Teams.  These Care Teams include your primary Cardiologist (physician) and Advanced Practice Providers (APPs -  Physician Assistants and Nurse Practitioners) who all work together to provide you with the care you need, when you need it.  We recommend signing up for the patient portal called "MyChart".  Sign up information is provided on this After Visit Summary.  MyChart is used to connect with patients for Virtual Visits (Telemedicine).  Patients are able to view lab/test results, encounter notes, upcoming appointments, etc.  Non-urgent messages can be sent to your provider as well.   To learn more about what you can do with MyChart, go to [email or web address].    Your next appointment:   6 month(s)  The format for your next appointment:   In Person  Provider:   You may see Werner Lean, MD or one of the following Advanced Practice Providers on your designated Care Team:    Melina Copa, PA-C  Ermalinda Barrios, PA-C

## 2020-09-24 NOTE — Progress Notes (Signed)
Cardiology Office Note:    Date:  09/24/2020   ID:  Pamela [name], DOB September 21, 1950, MRN [id number]  PCP:  Dorena Dew, FNP   Referring MD: Dorena Dew, FNP   IR:[id number] follow up Consulted for the evaluation of  at the behest of Dorena Dew, FNP Prior seen br Dr. Percival Spanish 2016.  History of Present Illness:    Pamela [name] is a 70 y.o. female with a hx of PFO seen on a 2/20/220 bubble study, prior stroke, chart Review hx of Sarcoidosis (vs subclinical), OSA not on CPAP, HTN, single kidney, and HFpE, and aortic atherosclerosis. On Oxygen intermittently since before 2016, Morbid Obesity . Originally seen 04/2020.  Seen 09/24/20.  Oncological History notable for: Malignancies: T1b Lung andeocarcinoma 2012 Surgery: NA Chemotherapy: NA Cessations for Toxicity: NA Radiation: SBRT [facility] care spearheaded by: Dr. Irene Limbo [facility]  Patient notes that she is doing great.  Since last visit notes  That she was in the hospital in [name] for COPD flare (related to her pollution.  Saw kidney doctor- creatinine has improved.  Is now off oxygen unless her COPD flares up.  Stopped her metolazone.    No chest pain or pressure .  No SOB/DOE.  Patient doesn't think she has gained weight; we discussed our readings of her weights here.  No palpitations or syncope.  Past Medical History:  Diagnosis Date  . Arthritis   . Asthma   . Cancer ([place])    lung, adenocarcinoma right lung 2012  . CHF (congestive heart failure) ([facility])    Preserved EF  . Congenital single kidney    With chronic kidney disease  . COPD (chronic obstructive pulmonary disease) ([place])   . Gout   . Hypertension   . Lung cancer [facility]) 2012   Right upper lobe lung adenocarcinoma diagnosed with needle biopsy treated by SBRT finished treatment April 2013 has been monitored since  . Mass of chest wall, right    Right chest wall mass 7.3 cm biopsy on 12/13/2013. Patient notes it was consistent with sarcoidosis but  actual pathology results not available.  . Oxygen deficiency   . Sarcoidosis   . Sickle cell trait ([place])   . Sleep apnea     Past Surgical History:  Procedure Laterality Date  . LOOP RECORDER INSERTION N/A 09/08/2018   Procedure: LOOP RECORDER INSERTION;  Surgeon: Evans Lance, MD;  Location: [place] CV LAB;  Service: Cardiovascular;  Laterality: N/A;  . LUNG BIOPSY    . TEE WITHOUT CARDIOVERSION N/A 09/08/2018   Procedure: TRANSESOPHAGEAL ECHOCARDIOGRAM (TEE);  Surgeon: Josue Hector, MD;  Location: [facility] ENDOSCOPY;  Service: Cardiovascular;  Laterality: N/A;  with loop  . TUBAL LIGATION    . VEIN LIGATION AND STRIPPING      Current Medications: Current Meds  Medication Sig  . acetaminophen (TYLENOL) 500 MG tablet Take 1,000 mg by mouth every 6 (six) hours as needed for mild pain.   [name] Kitchen albuterol (PROAIR HFA) 108 (90 Base) MCG/ACT inhaler Inhale 2 puffs into the lungs every 6 (six) hours as needed for wheezing or shortness of breath.  [name] Kitchen albuterol (PROVENTIL) (2.5 MG/3ML) 0.083% nebulizer solution Take 3 mLs (2.5 mg total) by nebulization every 6 (six) hours as needed for wheezing or shortness of breath.  [name] Kitchen aspirin EC 81 MG tablet Take 1 tablet (81 mg total) by mouth daily. Swallow whole.  [name] Kitchen atorvastatin (LIPITOR) 40 MG tablet Take 1 tablet (40 mg total) by mouth daily.  [name] Kitchen  budesonide-formoterol (SYMBICORT) 160-4.5 MCG/ACT inhaler Inhale 2 puffs into the lungs 2 (two) times daily.  . calcitRIOL (ROCALTROL) 0.25 MCG capsule Take 1 capsule (0.25 mcg total) by mouth daily.  [name] Kitchen docusate sodium (COLACE) 100 MG capsule Take 1 capsule (100 mg total) by mouth 2 (two) times daily.  . famotidine (PEPCID) 20 MG tablet One after supper  . febuxostat (ULORIC) 40 MG tablet Take 1 tablet (40 mg total) by mouth daily.  . furosemide (LASIX) 40 MG tablet Take 1 tablet (40 mg total) by mouth 2 (two) times daily. For CHF  . magnesium oxide (MAG-OX) 400 (241.3 Mg) MG tablet Take 1 tablet (400 mg total) by  mouth daily.  . montelukast (SINGULAIR) 10 MG tablet TAKE 1 TABLET BY MOUTH AT BEDTIME  . pantoprazole (PROTONIX) 40 MG tablet TAKE 1 TABLET BY MOUTH 30-60 MINUTES ONCE DAILY(FIRST MEAL OF THE DAY)  . potassium chloride SA (KLOR-CON) 20 MEQ tablet Take 1 tablet by mouth once daily     Allergies:   Sulfa antibiotics   Social History   Socioeconomic History  . Marital status: Divorced    Spouse name: Not on file  . Number of children: 2  . Years of education: Not on file  . Highest education level: Not on file  Occupational History  . Not on file  Tobacco Use  . Smoking status: Never Smoker  . Smokeless tobacco: Never Used  Vaping Use  . Vaping Use: Never used  Substance and Sexual Activity  . Alcohol use: No    Alcohol/week: 0.0 standard drinks  . Drug use: No  . Sexual activity: Not Currently  Other Topics Concern  . Not on file  Social History Narrative   Lives with son.  One son is deceased.     Social Determinants of Health   Financial Resource Strain: Not on file  Food Insecurity: Not on file  Transportation Needs: Not on file  Physical Activity: Not on file  Stress: Not on file  Social Connections: Not on file    Social:  From [name]  Family History: The patient's family history includes Cancer in her brother and father; Cervical cancer in her sister; Diabetes in her brother and sister; Heart disease in her father; Hypertension in her mother; Multiple myeloma in her sister; Renal Disease in her mother.  ROS:   Please see the history of present illness.    All other systems reviewed and are negative.  EKGs/Labs/Other Studies Reviewed:    The following studies were reviewed today:  EKG:  12/19/19 Sinus rhythm 70 LAFB.  Cardiac Event Monitoring: Results: Reveal Linq implanted- no evidence of atrial fibrillation    Transthoracic Echocardiogram: Date: 09/08/18 Results: Left Ventricle: The left ventricle has hyperdynamic systolic function,  with an  ejection fraction of >65%. The cavity size was normal. There is no  increase in left ventricular wall thickness. Left ventricular diastolic  parameters were normal  Right Ventricle: The right ventricle has normal systolic function. The  cavity was normal. There is no increase in right ventricular wall  thickness.    Left Atrium: left atrial size was normal in size  Right Atrium: right atrial size was normal in size. Right atrial pressure  is estimated at 3 mmHg.    Interatrial Septum: No atrial level shunt detected by color flow Doppler.  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  Pericardium: There is no evidence of pericardial effusion.  Mitral Valve: The mitral valve is normal in structure. Mitral  valve  regurgitation is not visualized by color flow Doppler.  Tricuspid Valve: The tricuspid valve is normal in structure. Tricuspid  valve regurgitation is trivial by color flow Doppler.  Aortic Valve: Aortic valve is probably tricuspid Aortic valve  regurgitation was not visualized by color flow Doppler.  Pulmonic Valve: The pulmonic valve was grossly normal. Pulmonic valve  regurgitation is not visualized by color flow Doppler.  Pulmonary Artery: The pulmonary artery is not well seen.  Venous: The inferior vena cava is normal in size with greater than 50%  respiratory variability.   NonCardiac CT : Date: 12/08/2019 Results: IMPRESSION: 1. Unchanged post treatment appearance of the right lung with bandlike radiation fibrosis and consolidation of the right upper lobe. No evidence of malignant recurrence or metastatic disease in the chest 2. Stable small bilateral pulmonary nodules measuring up to 4 mm, very likely benign. 3. Unchanged soft tissue mass of the right pectoralis major and minor musculature, measuring approximately 5.1 x 3.0 cm, of uncertain nature. 4.  Aortic Atherosclerosis (ICD10-I70.0).   Recent Labs: 12/17/2019: B Natriuretic Peptide 27.5 12/19/2019:  ALT 16 12/20/2019: Hemoglobin 12.0; Platelets 171 12/26/2019: Magnesium 1.8 04/26/2020: BUN 61; Creatinine, Ser 2.70; NT-Pro BNP 494; Potassium 3.6; Sodium 143  Recent Lipid Panel    Component Value Date/Time   CHOL 130 03/01/2019 1136   TRIG 105 03/01/2019 1136   HDL 48 03/01/2019 1136   CHOLHDL 2.7 03/01/2019 1136   CHOLHDL 3.8 09/07/2018 0416   VLDL 21 09/07/2018 0416   LDLCALC 61 03/01/2019 1136   09/07/19 Echo  09/06/18  MRA FINDINGS: Both internal carotid arteries are widely patent into the brain. No siphon stenosis. The anterior and middle cerebral vessels are patent without proximal stenosis, aneurysm or vascular malformation.  Both vertebral arteries are widely patent to the basilar. No basilar stenosis. Posterior circulation branch vessels appear normal.  IMPRESSION: Normal examination. Specifically, no posterior circulation or basilar tip abnormality seen.   Physical Exam:    VS:  BP 120/68   Pulse 68   Ht '5\' 7"'  (1.702 m)   Wt (!) 341 lb (154.7 kg)   SpO2 96%   BMI 53.41 kg/m     Wt Readings from Last 3 Encounters:  09/24/20 (!) 341 lb (154.7 kg)  05/13/20 (!) 325 lb 3.2 oz (147.5 kg)  04/26/20 (!) 318 lb (144.2 kg)     GEN: Morbidly obese female in  in no acute distress HEENT: Normal NECK: No JVD; No carotid bruits LYMPHATICS: No lymphadenopathy CARDIAC: RRR, no murmurs, rubs, gallops RESPIRATORY:  Inspiratory and expiratory wheezes ABDOMEN: Soft, non-tender, non-distended MUSCULOSKELETAL:  2+ edema R> L leg thought improved from prior; No deformity  SKIN: chronic edematous skin changes NEUROLOGIC:  Alert and oriented x 3 PSYCHIATRIC:  Normal affect   ASSESSMENT:    1. Chronic diastolic CHF (congestive heart failure) ([place])   2. Morbid obesity ([place])   3. Stage 3b chronic kidney disease ([place])   4. Essential hypertension   5. Aortic atherosclerosis ([place])   6. Malignant neoplasm of lung, unspecified laterality, unspecified part of lung ([place])   7.  Sarcoidosis   8. PFO (patent foramen ovale)   9. History of stroke    PLAN:    In order of problems listed above:  Heart Failure with preserved EF Morbid Obesity CKD Stage IIIb seeing nephrology HTN - NYHA class A, Stage B, hypervolemic   - Recommend daily weights and fluid restriction of  2 L  - BP at goal -  SHARED DECISION MAKING:  Discussed that give her increase weight gain and persistent leg swelling, that thought her breathing issues are multifactorial; she may benefit from increase in lasix.  Patient notes that she never wants to end up on HD.  We discussed that if further SOB or weight gain, for patient to let us know and we will increase  lasix  Aortic atherosclerosis -LDL goal less than 70 (at goal in 2020) -continue current statin - gave education on dietary changes  Prior Malignancy with Cardiotoxic Radiation - with Age ?6 y - unknown Gy where the treatment field was the heart - cholesterol goal as above  Cardiac Sarcoidosis Eval - Without extracardiac sarcoidosis - Without biopsy - Without palpitations , presyncope, or syncope. - Without  complete left or right bundle branch block; presence of unexplained pathologic Q waves in two or more leads; sustained first-, second-, or third-degree AV block; or sustained or nonsustained VT - without  regional wall motion abnormality, ventricular aneurysm, basal septal thinning, or depressed LVEF on prior echo - will defer cardiac imaging at this time  PFO and secondary stroke prevention - ASA - no further strokes with multiple risk factors; patient would prefer conservative therapy which is reasonable.  Six months follow up unless new symptoms or abnormal test results warranting change in plan  Would be reasonable for  APP Follow up   Medication Adjustments/Labs and Tests Ordered: Current medicines are reviewed at length with the patient today.  Concerns regarding medicines are outlined above.  No orders of the  defined types were placed in this encounter.  No orders of the defined types were placed in this encounter.   Patient Instructions  Medication Instructions:  Your physician recommends that you continue on your current medications as directed. Please refer to the Current Medication list given to you today.  *If you need a refill on your cardiac medications before your next appointment, please call your pharmacy*   Lab Work: NONE If you have labs (blood work) drawn today and your tests are completely normal, you will receive your results only by: [name] Kitchen MyChart Message (if you have MyChart) OR . A paper copy in the mail If you have any lab test that is abnormal or we need to change your treatment, we will call you to review the results.   Testing/Procedures: NONE   Follow-Up: At [facility], you and your health needs are our priority.  As part of our continuing mission to provide you with exceptional heart care, we have created designated Provider Care Teams.  These Care Teams include your primary Cardiologist (physician) and Advanced Practice Providers (APPs -  Physician Assistants and Nurse Practitioners) who all work together to provide you with the care you need, when you need it.  We recommend signing up for the patient portal called "MyChart".  Sign up information is provided on this After Visit Summary.  MyChart is used to connect with patients for Virtual Visits (Telemedicine).  Patients are able to view lab/test results, encounter notes, upcoming appointments, etc.  Non-urgent messages can be sent to your provider as well.   To learn more about what you can do with MyChart, go to [email or web address].    Your next appointment:   6 month(s)  The format for your next appointment:   In Person  Provider:   You may see Werner Lean, MD or one of the following Advanced Practice Providers on your designated Care Team:    Melina Copa, [place]  Ermalinda Barrios,  PA-C          Signed, Werner Lean, MD  09/24/2020 10:33 AM    [place]

## 2020-09-30 ENCOUNTER — Ambulatory Visit (INDEPENDENT_AMBULATORY_CARE_PROVIDER_SITE_OTHER): Payer: Medicare Other

## 2020-09-30 DIAGNOSIS — I639 Cerebral infarction, unspecified: Secondary | ICD-10-CM | POA: Diagnosis not present

## 2020-10-01 ENCOUNTER — Encounter: Payer: Self-pay | Admitting: Family Medicine

## 2020-10-01 ENCOUNTER — Ambulatory Visit (INDEPENDENT_AMBULATORY_CARE_PROVIDER_SITE_OTHER): Payer: Medicare Other | Admitting: Family Medicine

## 2020-10-01 ENCOUNTER — Other Ambulatory Visit: Payer: Self-pay

## 2020-10-01 VITALS — BP 137/98 | HR 73 | Ht 67.0 in | Wt 340.0 lb

## 2020-10-01 DIAGNOSIS — R7303 Prediabetes: Secondary | ICD-10-CM

## 2020-10-01 DIAGNOSIS — I639 Cerebral infarction, unspecified: Secondary | ICD-10-CM

## 2020-10-01 DIAGNOSIS — I10 Essential (primary) hypertension: Secondary | ICD-10-CM

## 2020-10-01 DIAGNOSIS — N183 Chronic kidney disease, stage 3 unspecified: Secondary | ICD-10-CM

## 2020-10-01 DIAGNOSIS — Z6841 Body Mass Index (BMI) 40.0 and over, adult: Secondary | ICD-10-CM

## 2020-10-01 DIAGNOSIS — J4530 Mild persistent asthma, uncomplicated: Secondary | ICD-10-CM

## 2020-10-01 LAB — POCT URINALYSIS DIPSTICK
Bilirubin, UA: NEGATIVE
Blood, UA: NEGATIVE
Glucose, UA: NEGATIVE
Ketones, UA: NEGATIVE
Leukocytes, UA: NEGATIVE
Nitrite, UA: NEGATIVE
Protein, UA: NEGATIVE
Spec Grav, UA: 1.01 (ref 1.010–1.025)
Urobilinogen, UA: 0.2 E.U./dL
pH, UA: 5.5 (ref 5.0–8.0)

## 2020-10-01 NOTE — Progress Notes (Signed)
[facility]   Established Patient Office Visit  Subjective:  Patient ID: Pamela [name], female    DOB: 16-Dec-1950  Age: 70 y.o. MRN: [id number]  CC:  Chief Complaint  Patient presents with  . Hypertension  . Chronic Kidney Disease    HPI Pamela [name] is a very pleasant 70 year old female with a medical history significant for essential hypertension, history of lung adenocarcinoma, history of CHF with preserved EF, chronic kidney disease stage III, morbid obesity, sarcoidosis, COPD, gout, and OSA presents for follow-up of chronic conditions. [name] states that she is doing very well and is without complaint on today.  Patient has remained active she continues to be very involved in her local church.  Patient resides here locally with her son. Patient has been taking all prescribed medications consistently and following up with specialists as scheduled. Patient has a long history of stage III chronic kidney disease and solitary kidney.  She is followed by nephrology.  Over the past 6 months patient has had worsening creatinine and GFR.  Her blood pressures been very well controlled and hemoglobin A1c has been stable.  Patient continues to follow a low-fat, low carbohydrate diet divided over small meals.  Patient has a history of morbid obesity.  Reviewed BMI, has been progressively increasing over the past several months.  Patient has some degree of lower extremity fluid retention.  She has not been checking daily weights.  Patient follows up with cardiology consistently.  Patient has continued furosemide 40 mg twice daily without interruption.  She is also monitoring her sodium intake daily her meals are typically prepared at home.  Ms. Boot denies any headache, chest pain, urinary symptoms, nausea, vomiting, or diarrhea.  No current shortness of breath or dizziness.   Past Medical History:  Diagnosis Date  . Arthritis   . Asthma    . Cancer ([place])    lung, adenocarcinoma right lung 2012  . CHF (congestive heart failure) (HCC)    Preserved EF  . Congenital single kidney    With chronic kidney disease  . COPD (chronic obstructive pulmonary disease) ([place])   . Gout   . Hypertension   . Lung cancer [facility]) 2012   Right upper lobe lung adenocarcinoma diagnosed with needle biopsy treated by SBRT finished treatment April 2013 has been monitored since  . Mass of chest wall, right    Right chest wall mass 7.3 cm biopsy on 12/13/2013. Patient notes it was consistent with sarcoidosis but actual pathology results not available.  . Oxygen deficiency   . Sarcoidosis   . Sickle cell trait ([place])   . Sleep apnea     Past Surgical History:  Procedure Laterality Date  . LOOP RECORDER INSERTION N/A 09/08/2018   Procedure: LOOP RECORDER INSERTION;  Surgeon: Evans Lance, MD;  Location: [facility];  Service: Cardiovascular;  Laterality: N/A;  . LUNG BIOPSY    . TEE WITHOUT CARDIOVERSION N/A 09/08/2018   Procedure: TRANSESOPHAGEAL ECHOCARDIOGRAM (TEE);  Surgeon: Josue Hector, MD;  Location: [facility] ENDOSCOPY;  Service: Cardiovascular;  Laterality: N/A;  with loop  . TUBAL LIGATION    . VEIN LIGATION AND STRIPPING      Family History  Problem Relation Age of Onset  . Hypertension Mother   . Renal Disease Mother   . Cancer Father        stomach  . Heart disease Father        No details  .  Cancer Brother   . Diabetes Brother   . Cervical cancer Sister   . Diabetes Sister   . Multiple myeloma Sister     Social History   Socioeconomic History  . Marital status: Divorced    Spouse name: Not on file  . Number of children: 2  . Years of education: Not on file  . Highest education level: Not on file  Occupational History  . Not on file  Tobacco Use  . Smoking status: Never Smoker  . Smokeless tobacco: Never Used  Vaping Use  . Vaping Use: Never used  Substance and Sexual Activity  . Alcohol use: No     Alcohol/week: 0.0 standard drinks  . Drug use: No  . Sexual activity: Not Currently  Other Topics Concern  . Not on file  Social History Narrative   Lives with son.  One son is deceased.     Social Determinants of Health   Financial Resource Strain: Not on file  Food Insecurity: Not on file  Transportation Needs: Not on file  Physical Activity: Not on file  Stress: Not on file  Social Connections: Not on file  Intimate Partner Violence: Not on file    Outpatient Medications Prior to Visit  Medication Sig Dispense Refill  . acetaminophen (TYLENOL) 500 MG tablet Take 1,000 mg by mouth every 6 (six) hours as needed for mild pain.     [name] Kitchen albuterol (PROAIR HFA) 108 (90 Base) MCG/ACT inhaler Inhale 2 puffs into the lungs every 6 (six) hours as needed for wheezing or shortness of breath. 8 g 3  . albuterol (PROVENTIL) (2.5 MG/3ML) 0.083% nebulizer solution Take 3 mLs (2.5 mg total) by nebulization every 6 (six) hours as needed for wheezing or shortness of breath. 100 mL 2  . aspirin EC 81 MG tablet Take 1 tablet (81 mg total) by mouth daily. Swallow whole. 30 tablet 11  . atorvastatin (LIPITOR) 40 MG tablet Take 1 tablet (40 mg total) by mouth daily. 90 tablet 4  . budesonide-formoterol (SYMBICORT) 160-4.5 MCG/ACT inhaler Inhale 2 puffs into the lungs 2 (two) times daily. 18 g 5  . calcitRIOL (ROCALTROL) 0.25 MCG capsule Take 1 capsule (0.25 mcg total) by mouth daily. 90 capsule 2  . docusate sodium (COLACE) 100 MG capsule Take 1 capsule (100 mg total) by mouth 2 (two) times daily. 60 capsule 0  . famotidine (PEPCID) 20 MG tablet One after supper 30 tablet 11  . febuxostat (ULORIC) 40 MG tablet Take 1 tablet (40 mg total) by mouth daily. 90 tablet 1  . furosemide (LASIX) 40 MG tablet Take 1 tablet (40 mg total) by mouth 2 (two) times daily. For CHF 180 tablet 1  . magnesium oxide (MAG-OX) 400 (241.3 Mg) MG tablet Take 1 tablet (400 mg total) by mouth daily. 30 tablet 0  . montelukast  (SINGULAIR) 10 MG tablet TAKE 1 TABLET BY MOUTH AT BEDTIME 90 tablet 0  . pantoprazole (PROTONIX) 40 MG tablet TAKE 1 TABLET BY MOUTH 30-60 MINUTES ONCE DAILY(FIRST MEAL OF THE DAY) 30 tablet 2  . potassium chloride SA (KLOR-CON) 20 MEQ tablet Take 1 tablet by mouth once daily 15 tablet 0   No facility-administered medications prior to visit.    Allergies  Allergen Reactions  . Sulfa Antibiotics Hives and Rash    ROS Review of Systems  Constitutional: Positive for unexpected weight change. Negative for activity change, appetite change and fatigue.  HENT: Negative.   Eyes: Negative.   Respiratory: Negative.  Cardiovascular: Negative for chest pain.  Gastrointestinal: Negative for abdominal distention and abdominal pain.  Endocrine: Negative for polydipsia, polyphagia and polyuria.  Genitourinary: Negative.   Musculoskeletal: Positive for arthralgias.  Skin: Negative.   Allergic/Immunologic: Negative.   Neurological: Negative.  Negative for dizziness and facial asymmetry.  Hematological: Negative.   Psychiatric/Behavioral: Negative.       Objective:    Physical Exam Constitutional:      Appearance: She is obese.  Eyes:     Pupils: Pupils are equal, round, and reactive to light.  Cardiovascular:     Rate and Rhythm: Normal rate and regular rhythm.     Pulses: Normal pulses.  Pulmonary:     Breath sounds: Wheezing present.  Abdominal:     General: Bowel sounds are normal.  Musculoskeletal:        General: Normal range of motion.  Skin:    General: Skin is warm.     Comments: Lower extremity lichenification and hyperpigmentation.  2+ pitting edema.  Neurological:     General: No focal deficit present.     Mental Status: She is alert. Mental status is at baseline.  Psychiatric:        Mood and Affect: Mood normal.        Thought Content: Thought content normal.        Judgment: Judgment normal.     BP (!) 137/98   Pulse 73   Ht _0  (1.702 m)   Wt (!) 340 lb  (154.2 kg)   BMI 53.25 kg/m  Wt Readings from Last 3 Encounters:  10/01/20 (!) 340 lb (154.2 kg)  09/24/20 (!) 341 lb (154.7 kg)  05/13/20 (!) 325 lb 3.2 oz (147.5 kg)     There are no preventive care reminders to display for this patient.  There are no preventive care reminders to display for this patient.  Lab Results  Component Value Date   TSH 3.934 05/21/2014   Lab Results  Component Value Date   WBC 6.4 12/20/2019   HGB 12.0 12/20/2019   HCT 38.3 12/20/2019   MCV 70.0 (L) 12/20/2019   PLT 171 12/20/2019   Lab Results  Component Value Date   NA 143 04/26/2020   K 3.6 04/26/2020   CHLORIDE 103 05/31/2017   CO2 29 04/26/2020   GLUCOSE 92 04/26/2020   BUN 61 (H) 04/26/2020   CREATININE 2.70 (H) 04/26/2020   BILITOT 0.5 12/19/2019   ALKPHOS 112 12/19/2019   AST 24 12/19/2019   ALT 16 12/19/2019   PROT 6.6 12/19/2019   ALBUMIN 3.1 (L) 12/19/2019   CALCIUM 9.3 04/26/2020   ANIONGAP 15 12/20/2019   EGFR 38 (L) 05/31/2017   Lab Results  Component Value Date   CHOL 130 03/01/2019   Lab Results  Component Value Date   HDL 48 03/01/2019   Lab Results  Component Value Date   LDLCALC 61 03/01/2019   Lab Results  Component Value Date   TRIG 105 03/01/2019   Lab Results  Component Value Date   CHOLHDL 2.7 03/01/2019   Lab Results  Component Value Date   HGBA1C 6.2 (H) 12/17/2019      Assessment & Plan:   Problem List Items Addressed This Visit   None   Visit Diagnoses    Stage 3 chronic kidney disease, unspecified whether stage 3a or 3b CKD ([place])    -  Primary   Relevant Orders   Comprehensive metabolic panel      1. Stage  3 chronic kidney disease, unspecified whether stage 3a or 3b CKD (HCC) - Comprehensive metabolic panel - POCT urinalysis dipstick  2. Class 3 severe obesity due to excess calories with serious comorbidity and body mass index (BMI) of 50.0 to 59.9 in adult [facility]). The patient is asked to make an attempt to improve diet and  exercise patterns to aid in medical management of this problem.   3. Essential hypertension BP (!) 137/98   Pulse 73   Ht _0  (1.702 m)   Wt (!) 340 lb (154.2 kg)   BMI 53.25 kg/m  No medication changes warranted at this time. - Continue medication, monitor blood pressure at home. Continue DASH diet. Reminder to go to the ER if any CP, SOB, nausea, dizziness, severe HA, changes vision/speech, left arm numbness and tingling and jaw pain.  4. Prediabetes Hemglobin a1C has remained at goal.   5. Mild persistent chronic asthma without complication Follow up with pulmonology as scheduled   Follow-up: Return in about 3 months (around 01/01/2021).   [name]  APRN, MSN, FNP-C [facility] [address] [place], [place] [address] [phone]

## 2020-10-01 NOTE — Patient Instructions (Addendum)
BP (!) 137/98   Pulse 73   Ht 5\' 7"  (1.702 m)   Wt (!) 340 lb (154.2 kg)   BMI 53.25 kg/m   Goal is to reduce weight by 20 pounds over the next 3 months.     Chronic Kidney Disease, Adult Chronic kidney disease is when lasting damage happens to the kidneys slowly over a long time. The kidneys help to:  Make pee (urine).  Make hormones.  Keep the right amount of fluids and chemicals in the body. Most often, this disease does not go away. You must take steps to help keep the kidney damage from getting worse. If steps are not taken, the kidneys might stop working forever. What are the causes?  Diabetes.  High blood pressure.  Diseases that affect the heart and blood vessels.  Other kidney diseases.  Diseases of the body's disease-fighting system.  A problem with the flow of pee.  Infections of the organs that make pee, store it, and take it out of the body.  Swelling or irritation of your blood vessels. What increases the risk?  Getting older.  Having someone in your family who has kidney disease or kidney failure.  Having a disease caused by genes.  Taking medicines often that harm the kidneys.  Being near or having contact with harmful substances.  Being very overweight.  Using tobacco now or in the past. What are the signs or symptoms?  Feeling very tired.  Having a swollen face, legs, ankles, or feet.  Feeling like you may vomit or vomiting.  Not feeling hungry.  Being confused or not able to focus.  Twitches and cramps in the leg muscles or other muscles.  Dry, itchy skin.  A taste of metal in your mouth.  Making less pee, or making more pee.  Shortness of breath.  Trouble sleeping. You may also become anemic or get weak bones. Anemic means there is not enough red blood cells or hemoglobin in your blood. You may get symptoms slowly. You may not notice them until the kidney damage gets very bad. How is this treated? Often, there is no cure  for this disease. Treatment can help with symptoms and help keep the disease from getting worse. You may need to:  Avoid alcohol.  Avoid foods that are high in salt, potassium, phosphorous, and protein.  Take medicines for symptoms and to help control other conditions.  Have dialysis. This treatment gets harmful waste out of your body.  Treat other problems that cause your kidney disease or make it worse. Follow these instructions at home: Medicines  Take over-the-counter and prescription medicines only as told by your doctor.  Do not take any new medicines, vitamins, or supplements unless your doctor says it is okay. Lifestyle  Do not smoke or use any products that contain nicotine or tobacco. If you need help quitting, ask your doctor.  If you drink alcohol: ? Limit how much you use to:  0-1 drink a day for women who are not pregnant.  0-2 drinks a day for men. ? Know how much alcohol is in your drink. In the [place] one drink equals one 12 oz bottle of beer (355 mL), one 5 oz glass of wine (148 mL), or one 1 oz glass of hard liquor (44 mL).  Stay at a healthy weight. If you need help losing weight, ask your doctor.   General instructions  Follow instructions from your doctor about what you cannot eat or drink.  Track  your blood pressure at home. Tell your doctor about any changes.  If you have diabetes, track your blood sugar.  Exercise at least 30 minutes a day, 5 days a week.  Keep your shots (vaccinations) up to date.  Keep all follow-up visits.   Where to find more information  American Association of Kidney Patients: BombTimer.gl  National Kidney Foundation: www.kidney.[place]: https://mathis.com/  Life Options: www.lifeoptions.org  Kidney School: www.kidneyschool.org Contact a doctor if:  Your symptoms get worse.  You get new symptoms. Get help right away if:  You get symptoms of end-stage kidney disease. These  include: ? Headaches. ? Losing feeling in your hands or feet. ? Easy bruising. ? Having hiccups often. ? Chest pain. ? Shortness of breath. ? Lack of menstrual periods, in women.  You have a fever.  You make less pee than normal.  You have pain or you bleed when you pee or poop. These symptoms may be an emergency. Get help right away. Call your local emergency services (911 in the [place]  Do not wait to see if the symptoms will go away.  Do not drive yourself to the hospital. Summary  Chronic kidney disease is when lasting damage happens to the kidneys slowly over a long time.  Causes of this disease include diabetes and high blood pressure.  Often, there is no cure for this disease. Treatment can help symptoms and help keep the disease from getting worse.  Treatment may involve lifestyle changes, medicines, and dialysis. This information is not intended to replace advice given to you by your health care provider. Make sure you discuss any questions you have with your health care provider. Document Revised: 10/11/2019 Document Reviewed: 10/11/2019 Elsevier Patient Education  [place].

## 2020-10-02 LAB — COMPREHENSIVE METABOLIC PANEL
ALT: 14 IU/L (ref 0–32)
AST: 23 IU/L (ref 0–40)
Albumin/Globulin Ratio: 1.4 (ref 1.2–2.2)
Albumin: 3.8 g/dL (ref 3.8–4.8)
Alkaline Phosphatase: 176 IU/L — ABNORMAL HIGH (ref 44–121)
BUN/Creatinine Ratio: 16 (ref 12–28)
BUN: 27 mg/dL (ref 8–27)
Bilirubin Total: 0.4 mg/dL (ref 0.0–1.2)
CO2: 23 mmol/L (ref 20–29)
Calcium: 8.8 mg/dL (ref 8.7–10.3)
Chloride: 103 mmol/L (ref 96–106)
Creatinine, Ser: 1.72 mg/dL — ABNORMAL HIGH (ref 0.57–1.00)
Globulin, Total: 2.7 g/dL (ref 1.5–4.5)
Glucose: 86 mg/dL (ref 65–99)
Potassium: 4.3 mmol/L (ref 3.5–5.2)
Sodium: 143 mmol/L (ref 134–144)
Total Protein: 6.5 g/dL (ref 6.0–8.5)
eGFR: 32 mL/min/{1.73_m2} — ABNORMAL LOW (ref >59)

## 2020-10-02 LAB — CUP PACEART REMOTE DEVICE CHECK
Date Time Interrogation Session: 20220309232525
Implantable Pulse Generator Implant Date: 20200220

## 2020-10-03 ENCOUNTER — Ambulatory Visit (INDEPENDENT_AMBULATORY_CARE_PROVIDER_SITE_OTHER): Payer: Medicare Other

## 2020-10-03 ENCOUNTER — Other Ambulatory Visit: Payer: Self-pay

## 2020-10-03 ENCOUNTER — Ambulatory Visit (INDEPENDENT_AMBULATORY_CARE_PROVIDER_SITE_OTHER): Payer: Medicare Other | Admitting: Podiatry

## 2020-10-03 DIAGNOSIS — B351 Tinea unguium: Secondary | ICD-10-CM

## 2020-10-03 DIAGNOSIS — M79675 Pain in left toe(s): Secondary | ICD-10-CM

## 2020-10-03 DIAGNOSIS — M722 Plantar fascial fibromatosis: Secondary | ICD-10-CM

## 2020-10-03 DIAGNOSIS — M79674 Pain in right toe(s): Secondary | ICD-10-CM

## 2020-10-03 MED ORDER — TRIAMCINOLONE ACETONIDE 10 MG/ML IJ SUSP
10.0000 mg | Freq: Once | INTRAMUSCULAR | Status: AC
Start: 2020-10-03 — End: 2020-10-03
  Administered 2020-10-03: 10 mg

## 2020-10-05 ENCOUNTER — Telehealth: Payer: Self-pay | Admitting: Family Medicine

## 2020-10-05 NOTE — Telephone Encounter (Signed)
Pamela [name] is a very pleasant 70 year old female with a medical history significant for prediabetes, morbid obesity, chronic kidney disease, history of sarcoidosis, history of lung carcinoma, and history of gouty arthritis that was evaluated in clinic on 10/01/2020 for chronic conditions. At that time, basic metabolic panel was obtained.  Creatinine (an indicator of kidney functioning) was markedly improved from previous.  Continue following a low-fat, low carbohydrate diet divided over small meals.  Will continue to follow creatinine closely, follow-up with nephrology as scheduled.  Will repeat hemoglobin A1c at follow-up visit in 3 months.   [name]  APRN, MSN, FNP-C [facility] [address] [place], [place] [address] [phone]

## 2020-10-06 NOTE — Progress Notes (Signed)
Subjective: 70 y.o. returns the office today for painful, elongated, thickened toenails which she cannot trim herself. Denies any redness or drainage around the nails.  She states that her right heel is been hurting again.  She is interested in another steroid injection.  She thinks that because of the right heel hurting she is walking differently putting pressure on the left foot and starting to become uncomfortable in the left side.  No recent injury or falls or changes otherwise. Denies any systemic complaints such as fevers, chills, nausea, vomiting.   Objective: AAO 3, NAD DP/PT pulses palpable, CRT less than 3 seconds Chronic lower extremity edema. Nails hypertrophic, dystrophic, elongated, brittle, discolored 10. There is tenderness overlying the nails 1-5 bilaterally. There is no surrounding erythema or drainage along the nail sites. Tenderness to palpation along the plantar medial tubercle of the calcaneus at the insertion of plantar fascia on the right side.  Plantar fascia appears to be intact.  No pain to the Achilles tendon.  No areas of pinpoint tenderness.  MMT 5/5 bilaterally.  No significant pain on exam to the left foot today otherwise. No pain with calf compression, swelling, warmth, erythema.  Assessment: Patient presents with symptomatic onychomycosis; right heel plantar fasciitis  Plan: -Treatment options including alternatives, risks, complications were discussed -X-rays obtained reviewed.  Spurring present of the inferior calcaneus but no evidence of acute fracture. -Steroid injection performed.  See procedure note below.  Continue stretching, icing daily and we also discussed shoe modifications. -Nails sharply debrided 10 without complication/bleeding. -Discussed daily foot inspection. If there are any changes, to call the office immediately.  -Follow-up in 3 months or sooner if any problems are to arise. In the meantime, encouraged to call the office with any  questions, concerns, changes symptoms.  Procedure: Injection Tendon/Ligament Discussed alternatives, risks, complications and verbal consent was obtained.  Location: RIGHT plantar fascia at the glabrous junction; medial approach. Skin Prep: Alcohol  Injectate: 0.5cc 0.5% marcaine plain, 0.5 cc 2% lidocaine plain and, 1 cc kenalog 10. Disposition: Patient tolerated procedure well. Injection site dressed with a band-aid.  Post-injection care was discussed and return precautions discussed.    Celesta Gentile, DPM

## 2020-10-09 NOTE — Progress Notes (Signed)
[facility] Summary Report / Loop Recorder 

## 2020-11-04 ENCOUNTER — Telehealth: Payer: Self-pay | Admitting: Emergency Medicine

## 2020-11-04 ENCOUNTER — Other Ambulatory Visit: Payer: Self-pay

## 2020-11-04 ENCOUNTER — Ambulatory Visit (INDEPENDENT_AMBULATORY_CARE_PROVIDER_SITE_OTHER): Payer: Medicare Other | Admitting: Podiatry

## 2020-11-04 DIAGNOSIS — I639 Cerebral infarction, unspecified: Secondary | ICD-10-CM | POA: Diagnosis not present

## 2020-11-04 DIAGNOSIS — M722 Plantar fascial fibromatosis: Secondary | ICD-10-CM | POA: Diagnosis not present

## 2020-11-04 DIAGNOSIS — Q828 Other specified congenital malformations of skin: Secondary | ICD-10-CM

## 2020-11-04 NOTE — Telephone Encounter (Signed)
Incomplete data from [id number] received on 11/02/20. Next scheduled remote 11/04/20.

## 2020-11-07 NOTE — Progress Notes (Signed)
Subjective: 70 year old female presents today for follow evaluation of right heel pain.  She said the heel still hurting.  The injection did not help much.  She thinks that she has a callus on the bottom of the heel.  Denies any open lesions.  No drainage.  No other recent injury or falls that she reports no other concerns. Denies any systemic complaints such as fevers, chills, nausea, vomiting. No acute changes since last appointment, and no other complaints at this time.   Objective: AAO x3, NAD DP/PT pulses palpable bilaterally, CRT less than 3 seconds Chronic lower extremity edema is present.  The plantar aspect of the calcaneus is a hyperkeratotic lesion.  Upon debridement there is no underlying ulceration, drainage or any signs of infection.  No foreign body.  Upon debridement there is resolution of discomfort.  No significant discomfort on the plantar medial tubercle of the calcaneus at insertion of plantar fascia.  No pain with lateral compression of calcaneus.  No pain Achilles tendon.  No pain with calf compression, swelling, warmth, erythema  Assessment: Hyperkeratotic lesion right heel  Plan: -All treatment options discussed with the patient including all alternatives, risks, complications.  -Sharply debrided the hyperkeratotic lesion x1 without any complications or bleeding.  Dispensed offloading pads discussed moisturizer daily. -I do think she still had plantar fasciitis symptoms last appointment.  Continue with the stretching, supportive shoes as well. -Patient encouraged to call the office with any questions, concerns, change in symptoms.   Trula Slade DPM

## 2020-11-11 ENCOUNTER — Ambulatory Visit: Payer: Medicare Other | Admitting: Internal Medicine

## 2020-11-12 ENCOUNTER — Ambulatory Visit (INDEPENDENT_AMBULATORY_CARE_PROVIDER_SITE_OTHER): Payer: Medicare Other | Admitting: Internal Medicine

## 2020-11-12 ENCOUNTER — Other Ambulatory Visit: Payer: Self-pay

## 2020-11-12 ENCOUNTER — Encounter: Payer: Self-pay | Admitting: Internal Medicine

## 2020-11-12 DIAGNOSIS — I639 Cerebral infarction, unspecified: Secondary | ICD-10-CM | POA: Diagnosis not present

## 2020-11-12 DIAGNOSIS — J4520 Mild intermittent asthma, uncomplicated: Secondary | ICD-10-CM | POA: Diagnosis not present

## 2020-11-12 NOTE — Assessment & Plan Note (Signed)
Complicated by hbp/ pre-dm  Body mass index is 55.46 kg/m.  -  trending up again  Lab Results  Component Value Date   TSH 3.934 05/21/2014     Contributing to gerd risk/ doe/reviewed the need and the process to achieve and maintain neg calorie balance > defer f/u primary care including intermittently monitoring thyroid status            Each maintenance medication was reviewed in detail including emphasizing most importantly the difference between maintenance and prns and under what circumstances the prns are to be triggered using an action plan format where appropriate.  Total time for H and P, chart review, counseling, reviewing hfa device(s) and generating customized AVS unique to this office visit / same day charting =  21 min

## 2020-11-12 NOTE — Progress Notes (Signed)
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     History of Present Illness  04/27/2014 1st [facility] office visit/ Pamela [name]   Chief Complaint  Patient presents with  . Pulmonary Consult    Referred by Dr. [name]. [name]  Pt recently moved to [place] from [name] and states needs to establish with new pulmonary MD.  She c/o DOE and cough, and states that she feels these symptoms are currently under control.    on prednisone 10 mg daily  since late July 2015 and still on freq saba and 2lpm  Already used   2puffs proair am of ov  rec Stop spiriva and advair Start dulera 100 Take 2 puffs first thing in am and then another 2 puffs about 12 hours later.  Work on inhaler technique:  relax and gently blow all the way out then take a nice smooth deep breath back in, triggering the inhaler at same time you start breathing in.  Hold for up to 5 seconds if you can.  Rinse and gargle with water when done Only use your albuterol (proair) as a rescue medication  If proair not helping, then use the neb and if needing the neb more than occastional, then take prednisone 10 mg daily  Please schedule a follow up office visit in 4 weeks, sooner if needed with all inhalers in hand for pfts on return   11/30/2014 f/u ov/Pamela [name] re: mild intermittent asthma ? Related to weather / still on acei plus on prednisone 10 mg daily for gout  Chief Complaint  Patient presents with  . Acute Visit    Pt c/o increased SOB for the past month. She is  also coughing and wheezing. Cough is prod with minimal yellow to clear sputum. She uses proair 2 x daily on average.   Also due for CT chest to f/u dx of lung ca dx March 2013  never surgery/ RT only  Onset of dry cough / wheeze was insidious/ pattern is progressively worse day >> noct with increasing  need for saba but very poor hfa  rec Stop lisinopril and start valsartan 80 mg one daily instead  Only use your albuterol as a rescue medication   Restart dulera 100 Take 2 puffs first thing in am and then another 2 puffs about 12 hours later until you only need  your rescue inhaler no more than twice weekly for cough/wheeze/ short of breath  Follow up with Dr [name] as planned and only see me if needed    06/06/2015  f/u ov/Pamela [name] re: worse since finished pred ? One week prior to [place]  / was taking for gout  Chief Complaint  Patient presents with  . Acute Visit    Increased SOB with or without exertion for the past 4 days. She also c/o cough and wheezing. Cough is prod with clear sputum.     Was doing much better while  on prednisone and gradually worse since off  Last used proventil about 7 am prior to OV  At 1130  On symbicort but hfa quite poor > see a/p Has neb but no meds for it  rec Prednisone Take 4 for three days 3 for three days 2 for three days 1 for three days and stop Work on inhaler technique:  relax and gently blow all the way out then take a nice smooth deep breath back in, triggering the inhaler at same time you start breathing in.  Hold for up to 5 seconds if you can. Blow out thru nose. Rinse and gargle with water when done Plan A = Automatic = symbicort 80 Take 2 puffs first thing in am and then another 2 puffs about 12 hours later.  Plan B = backup  Only use your albuterol(proair)   Plan C = crisis - only use nebulizer albuterol if you try the proair and it doesn't work         02/22/2018  f/u ov/Pamela [name] re: asthma / off 02 / symb/flonase / does not use med cal as rec   Chief Complaint  Patient presents with  . Follow-up    Breathing has been doing well. She states she has had cough with clear sputum for the past wk. She rarely uses albuterol inhaler or neb.   Dyspnea:  MMRC3 = can't walk 100 yards even at a slow pace at a flat grade s stopping due to sob or back Cough: not much Sleeping: < 30 degrees multiple pillows  SABA use: as above  rec Work on inhaler technique:  - late add: consider adding singulair if not well controlled      08/02/2018 acute extended ov/Pamela [name] re: sym 160 2bid / no longer flonase / was on pred 2.5 mg qod  Chief Complaint  Patient presents with  . Acute Visit    Pt c/o cough with yellow to green sputum for the past 5 days. She had fever yesterday.  She has been having to use her rescue inhaler and neb daily since her symptoms started.    states was doing fine on just symb 160  2 bid when abruptly ill x 5 d prior to OV  With st, fever, wheeze/ cough prod puruelnt sputum and fever one day prior to OV  But not on day of ov, feels better p saba  Previously not needing ventolin or neb but up to bid now -did not realize she could go to every 4 hours as needed as per the action plan she was already provided.  rec zpak  Prednisone 10 mg take  4 each am x 2 days,   2 each am x 2 days,  1 each am x 2 days and stop   Please schedule a follow up office visit in 4 weeks, sooner if needed pfts on return - add also needs feno on return     08/30/2018  f/u ov/Pamela [name] re: moderate chronic asthma maint on symb 160 2bid no prednisone listed  Chief Complaint  Patient presents with  . Follow-up    PFT's done today. Breathing has improved since the last visit. She uses her albuterol inhaler rarely.   Dyspnea:  Walks at [employer] with cane  Cough: none Sleeping:  Bed flat, 4 pillows  SABA use: none 02: no  rec - add singulair 10 mg daily due to high feno and repeat feno next ov    02/23/2019  f/u ov/Pamela [name] re:  Asthma /  MO maint symb  160/singulair  Chief Complaint  Patient presents with  . Follow-up    Patient reports that her sob and cough have  been doing well.    Dyspnea:  [employer] with cane  Cough: no Sleeping: flat bed with 4 head pillows SABA use: not recently 02: none  rec No change rx  02/23/20 admit [place] and d/c 0 2  - had 3 days warning >  D/c on spiriva    04/01/2020  f/u ov/Alberto Pina re:  Asthma / MO  Chief Complaint  Patient presents with  . Follow-up    Reports productive cough x4 days.  Dyspnea:  [employer] with cane with or without 02  Cough: since 9/9 clear mucus Sleeping: flat bed with with 3 pillows  SABA use: neither right now  02: prn  rec Plan A = Automatic = Always=    Symbicort 160 Take 2 puffs first thing in am and then another 2 puffs about 12 hours later.  And singulair at night and stop spiriva  Work on inhaler technique:   Plan B = Backup (to supplement plan A, not to replace it) Only use your albuterol inhaler (puffer) as a rescue medication  Plan C = Crisis (instead of Plan B but only if Plan B stops working) - only use your albuterol nebulizer if you first try Plan B and it fails to help > ok to use the nebulizer up to every 4 hours but if start needing it regularly call for immediate appointment Plan D = Deltasone  - call me if B and C not adequate > add prednisone Prednisone 10 mg take  4 each am x 2 days,   2 each am x 2 days,  1 each am x 2 days and stop (refillable)  Plan E = ER - go to ER or call 911 if all else fails   Make sure you check your oxygen saturations at highest level  Pantoprazole (protonix) 40 mg   Take  30-60 min before first meal of the day and Pepcid (famotidine)  20 mg one @  bedtime until return to office - this is the best way to tell whether stomach acid is contributing to your problem.   GERD diet   05/13/2020  f/u ov/Har[place] Thresher re:  Asthma / MO/ maint on symbicort 160/ singulair though hfa poor  Chief Complaint  Patient presents with  . Follow-up     Denies any problems with breathing   Dyspnea:  [employer] with cane  - walks with sats always above 90% Cough: none  Sleeping: flat bed, 3 pillows "like a baby" SABA use: symbicort 80 2bid / not needing saba at all/ no need for prednisone 02: none at all since [name] trip  rec Stop pantoprazole and change famotidine twice daily after bfast and after supper for full week then just the dose after supper for a week then stop - if you have any obvious problems start back on previous  GERD diet Ok to stop the oxygen   11/12/2020  f/u ov/Ishitha Roper re: asthma/ MO - no longer on ppi or singulair x months Chief Complaint  Patient presents with  . Follow-up    Sob-same, no cough   Dyspnea:  Does [employer] shopping pushing the cart, walks with cane  Cough: none  Sleeping: flat bed/ 3 pillows  SABA use: none / symbicort 160 2bid  02: none  Covid status:  vax x 3/ pfizer    No obvious day to day  or daytime variability or assoc excess/ purulent sputum or mucus plugs or hemoptysis or cp or chest tightness, subjective wheeze or overt sinus or hb symptoms.   98 without nocturnal  or early am exacerbation  of respiratory  c/o's or need for noct saba. Also denies any obvious fluctuation of symptoms with weather or environmental changes or other aggravating or alleviating factors except as outlined above   No unusual exposure hx or h/o childhood pna/ asthma or knowledge of premature birth.  Current Allergies, Complete Past Medical History, Past Surgical History, Family History, and Social History were reviewed in [employer] record.  ROS  The following are not active complaints unless bolded Hoarseness, sore throat, dysphagia, dental problems, itching, sneezing,  nasal congestion or discharge of excess mucus or purulent secretions, ear ache,   fever, chills, sweats, unintended wt loss or wt gain, classically pleuritic or exertional cp,  orthopnea pnd or arm/hand swelling  or leg swelling,  presyncope, palpitations, abdominal pain, anorexia, nausea, vomiting, diarrhea  or change in bowel habits or change in bladder habits, change in stools or change in urine, dysuria, hematuria,  rash, arthralgias, visual complaints, headache, numbness, weakness or ataxia or problems with walking or coordination,  change in mood or  memory.        Current Meds  Medication Sig  . acetaminophen (TYLENOL) 500 MG tablet Take 1,000 mg by mouth every 6 (six) hours as needed for mild pain.   [name] Kitchen albuterol (PROVENTIL) (2.5 MG/3ML) 0.083% nebulizer solution Take 3 mLs (2.5 mg total) by nebulization every 6 (six) hours as needed for wheezing or shortness of breath.  [name] Kitchen aspirin EC 81 MG tablet Take 1 tablet (81 mg total) by mouth daily. Swallow whole.  [name] Kitchen atorvastatin (LIPITOR) 40 MG tablet Take 1 tablet (40 mg total) by mouth daily.  . budesonide-formoterol (SYMBICORT) 160-4.5 MCG/ACT inhaler Inhale 2 puffs into the lungs 2 (two) times daily.  . calcitRIOL (ROCALTROL) 0.25 MCG capsule Take 1 capsule (0.25 mcg total) by mouth daily.  [name] Kitchen docusate sodium (COLACE) 100 MG capsule Take 1 capsule (100 mg total) by mouth 2 (two) times daily.  . famotidine (PEPCID) 20 MG tablet One after supper  . febuxostat (ULORIC) 40 MG tablet Take 1 tablet (40 mg total) by mouth daily.  . furosemide (LASIX) 40 MG tablet Take 1 tablet (40 mg total) by mouth 2 (two) times daily. For CHF  . magnesium oxide (MAG-OX) 400 (241.3 Mg) MG tablet Take 1 tablet (400 mg total) by mouth daily.  . montelukast (SINGULAIR) 10 MG tablet TAKE 1 TABLET BY MOUTH AT BEDTIME  . potassium chloride SA (KLOR-CON) 20 MEQ tablet Take 1 tablet by mouth once daily                        Objective:   Physical Exam  11/12/2020   342 05/13/2020 325  04/01/2020  330   02/23/2019    354   08/30/2018  366   08/02/2018  360   11/30/2014   343     Vital signs reviewed  11/12/2020  - Note at rest 02 sats  98% on RA   General appearance:    Massively obese  bf nad    HEENT : pt wearing mask not removed for exam due to covid -19 concerns.    NECK :  without JVD/Nodes/TM/ nl carotid upstrokes bilaterally   LUNGS: no acc muscle use,  Nl contour chest which is clear  to A and P bilaterally without cough on insp or exp maneuvers   CV:  RRR  no s3 or murmur or increase in P2, and  2+ pitting both LEs  ABD:  soft and nontender with nl inspiratory excursion in the supine position. No bruits or organomegaly appreciated, bowel sounds nl  MS:  Nl gait/ ext warm without deformities, calf tenderness, cyanosis or clubbing No obvious joint restrictions   SKIN: warm and dry with elephantiasis changes both LE's   NEURO:  alert, approp, nl sensorium with  no motor or cerebellar deficits apparent.

## 2020-11-12 NOTE — Assessment & Plan Note (Signed)
Onset 1990's while living in [name]  pfts 07/31/13  FEV1  1.53 (58%) with ratio 66 > p saba ratio 74 FEV1 1.68 (64%)  - -04/27/2014  try dulera 100 2 bid  - PFT's 07/19/2014 FEV1 2.00 (93%) and ratio 85 after 21% improvement from saba with no inhalers x one week  - trial off acei 12/01/2014 due to pseudoasthma component > resolved  - Spirometry 06/01/2016  FEV1 1.06 (48%)  Ratio 61  - FENO 06/01/2016  =   85 on symbicort 80 x2 > increase to 160  - Prednisone 10 mg floor x years, rec increase  to 20 mg if needing neb  - 10/15/2016 reduced pred to 5 mg daily (gout flares with taper)  - Spirometry 02/22/2018  FEV1 1.23 (57%)  Ratio 71 with min curvature  - FENO 02/22/2018  = 61 - 02/22/2018  After extensive coaching inhaler device  effectiveness =    90% from floor of 75%    All goals of chronic asthma control met including optimal function and elimination of symptoms with minimal need for rescue therapy on just symbicort 160 2bid and off singulair and ppi   Contingencies discussed in full including contacting this office immediately if not controlling the symptoms using the rule of two's.     F/u yearly fine with me

## 2020-11-12 NOTE — Patient Instructions (Signed)
Plan A = Automatic = Always=    symbicort 160 Take 2 puffs first thing in am and then another 2 puffs about 12 hours later.      Plan B = Backup (to supplement plan A, not to replace it) Only use your albuterol inhaler as a rescue medication to be used if you can't catch your breath by resting or doing a relaxed purse lip breathing pattern.  - The less you use it, the better it will work when you need it. - Ok to use the inhaler up to 2 puffs  every 4 hours if you must but call for appointment if use goes up over your usual need - Don't leave home without it !!  (think of it like the spare tire for your car)   Plan C = Crisis (instead of Plan B but only if Plan B stops working) - only use your albuterol nebulizer if you first try Plan B and it fails to help > ok to use the nebulizer up to every 4 hours but if start needing it regularly call for immediate appointment    Please schedule a follow up visit in 12 months but call sooner if needed

## 2020-11-14 ENCOUNTER — Telehealth: Payer: Self-pay

## 2020-11-14 ENCOUNTER — Other Ambulatory Visit: Payer: Self-pay

## 2020-11-14 DIAGNOSIS — E876 Hypokalemia: Secondary | ICD-10-CM

## 2020-11-14 MED ORDER — POTASSIUM CHLORIDE CRYS ER 20 MEQ PO TBCR: 20.0000 meq | EXTENDED_RELEASE_TABLET | Freq: Every day | ORAL | 1 refills | Status: DC

## 2020-11-14 NOTE — Telephone Encounter (Signed)
Sent meds to pharmacy  

## 2020-11-14 NOTE — Telephone Encounter (Signed)
Med refill   Potassium

## 2020-11-20 ENCOUNTER — Ambulatory Visit (HOSPITAL_COMMUNITY)
Admission: RE | Admit: 2020-11-20 | Discharge: 2020-11-20 | Disposition: A | Discharge status: Home / Self Care | Payer: Medicare Other | Source: Ambulatory Visit | Attending: Hematology | Admitting: Hematology

## 2020-11-20 ENCOUNTER — Other Ambulatory Visit: Payer: Self-pay

## 2020-11-20 DIAGNOSIS — J948 Other specified pleural conditions: Secondary | ICD-10-CM | POA: Diagnosis not present

## 2020-11-20 DIAGNOSIS — C3490 Malignant neoplasm of unspecified part of unspecified bronchus or lung: Secondary | ICD-10-CM | POA: Insufficient documentation

## 2020-11-20 DIAGNOSIS — J8410 Pulmonary fibrosis, unspecified: Secondary | ICD-10-CM | POA: Diagnosis not present

## 2020-11-20 DIAGNOSIS — M7989 Other specified soft tissue disorders: Secondary | ICD-10-CM | POA: Diagnosis not present

## 2020-11-26 ENCOUNTER — Telehealth: Payer: Self-pay | Admitting: Family Medicine

## 2020-11-26 NOTE — Telephone Encounter (Signed)
Error. Disregard

## 2020-11-27 ENCOUNTER — Other Ambulatory Visit: Payer: Self-pay | Admitting: *Deleted

## 2020-11-27 NOTE — Patient Outreach (Signed)
[facility]) Care Management  11/27/2020  Giovanni Bath 11/20/1950 [id number]   Member previously active with [facility] [employer].  Call placed to member for initial assessment/screening.  Member initially identified herself however after this care manager introduced self she stated that "the patient" was not available and call abruptly ended.  Will send outreach letter and follow up within the next 3-4 business days.  Valente David, [place], MSN [place] [phone]

## 2020-12-03 ENCOUNTER — Other Ambulatory Visit: Payer: Self-pay | Admitting: *Deleted

## 2020-12-03 ENCOUNTER — Encounter: Payer: Self-pay | Admitting: *Deleted

## 2020-12-03 NOTE — Patient Outreach (Signed)
[facility]) Care Management  [place]  12/03/2020   Lenise Jr [birth year] [id number]  Referral Date: 5/10 Referral Source: Insurance Referral Reason: Chronic case management Insurance: [place] attempt #2, successful.  Identity verified.  This care manager introduced self and stated purpose of call.  [facility] care management services explained.    Social: Report she is independent, uses cane or walker for ambulation.  Lives with son and daughter in law.  Conditions: Per chart, has history of HTN, CHF, COPD, lung cancer, GERD, Stroke, Gout, obesity, and HLD.    Medications: Reviewed with member, state she is taking all as instructed using a weekly pill box.  Appointments: Last seen by PCP on 3/15, follow up on 6/21.  Uses [facility] for transportation.   Encounter Medications:  Outpatient Encounter Medications as of 12/03/2020  Medication Sig  . acetaminophen (TYLENOL) 500 MG tablet Take 1,000 mg by mouth every 6 (six) hours as needed for mild pain.   [name] Kitchen albuterol (PROAIR HFA) 108 (90 Base) MCG/ACT inhaler Inhale 2 puffs into the lungs every 6 (six) hours as needed for wheezing or shortness of breath.  [name] Kitchen albuterol (PROVENTIL) (2.5 MG/3ML) 0.083% nebulizer solution Take 3 mLs (2.5 mg total) by nebulization every 6 (six) hours as needed for wheezing or shortness of breath.  [name] Kitchen aspirin EC 81 MG tablet Take 1 tablet (81 mg total) by mouth daily. Swallow whole.  [name] Kitchen atorvastatin (LIPITOR) 40 MG tablet Take 1 tablet (40 mg total) by mouth daily.  . budesonide-formoterol (SYMBICORT) 160-4.5 MCG/ACT inhaler Inhale 2 puffs into the lungs 2 (two) times daily.  . calcitRIOL (ROCALTROL) 0.25 MCG capsule Take 1 capsule (0.25 mcg total) by mouth daily.  [name] Kitchen docusate sodium (COLACE) 100 MG capsule Take 1 capsule (100 mg total) by mouth 2 (two) times daily.  . famotidine (PEPCID) 20 MG tablet One after supper  . febuxostat (ULORIC) 40 MG tablet Take 1 tablet (40 mg total) by  mouth daily.  . furosemide (LASIX) 40 MG tablet Take 1 tablet (40 mg total) by mouth 2 (two) times daily. For CHF  . magnesium oxide (MAG-OX) 400 (241.3 Mg) MG tablet Take 1 tablet (400 mg total) by mouth daily.  . montelukast (SINGULAIR) 10 MG tablet TAKE 1 TABLET BY MOUTH AT BEDTIME  . potassium chloride SA (KLOR-CON) 20 MEQ tablet Take 1 tablet (20 mEq total) by mouth daily.  . pantoprazole (PROTONIX) 40 MG tablet TAKE 1 TABLET BY MOUTH 30-60 MINUTES ONCE DAILY(FIRST MEAL OF THE DAY) (Patient not taking: No sig reported)   No facility-administered encounter medications on file as of 12/03/2020.    Functional Status:  In your present state of health, do you have any difficulty performing the following activities: 12/17/2019  Hearing? N  Vision? N  Difficulty concentrating or making decisions? N  Walking or climbing stairs? Y  Dressing or bathing? N  Doing errands, shopping? Y  Some recent data might be hidden    Fall/Depression Screening: Fall Risk  04/09/2020 01/16/2020 12/26/2019  Falls in the past year? 0 0 0  Number falls in past yr: 0 - -  Injury with Fall? 0 - -  Risk for fall due to : - - -  Follow up - Falls evaluation completed -   PHQ 2/9 Scores 12/03/2020 01/16/2020 12/26/2019 10/24/2019 08/29/2019 05/30/2019 02/15/2019  PHQ - 2 Score 0 0 0 0 0 0 0    Assessment:  Goals Addressed  This Visit's Progress   . [facility] - Set My Weight Loss Goal       Follow Up Date 6/13  Timeframe:  Long-Range Goal Priority:  Medium Start Date:           5/17                  Expected End Date:  8/17                     Barriers: Health Behaviors    - set weight loss goal - lose 20 pounds   Why is this important?   Losing only 5 to 15 percent of your weight makes a big difference in your health.    Notes:   5/17 - Will provide member with education regarding weight loss.    [facility] - Track and Manage Fluids and Swelling-Heart Failure       Timeframe:  Short-Term  Goal Priority:  High Start Date:         5/17                    Expected End Date:       6/17                Follow Up Date 6/13  Barriers: Health Behaviors Knowledge    - call office if I gain more than 2 pounds in one day or 5 pounds in one week - keep legs up while sitting - track weight in diary - use salt in moderation    Why is this important?    It is important to check your weight daily and watch how much salt and liquids you have.   It will help you to manage your heart failure.    Notes:   5/17 - Will send member education regarding low salt and HF zones       Plan:  Follow-up:  Patient agrees to Care Plan and Follow-up.  Will send education regarding HF management as well as diet/weight loss.  Will notify PCP of [facility] involvement.  Will follow up within the next month.  Valente David, [place], MSN [place] [phone]

## 2020-12-03 NOTE — Patient Instructions (Signed)
Calorie Counting for Weight Loss Calories are units of energy. Your body needs a certain number of calories from food to keep going throughout the day. When you eat or drink more calories than your body needs, your body stores the extra calories mostly as fat. When you eat or drink fewer calories than your body needs, your body burns fat to get the energy it needs. Calorie counting means keeping track of how many calories you eat and drink each day. Calorie counting can be helpful if you need to lose weight. If you eat fewer calories than your body needs, you should lose weight. Ask your health care provider what a healthy weight is for you. For calorie counting to work, you will need to eat the right number of calories each day to lose a healthy amount of weight per week. A dietitian can help you figure out how many calories you need in a day and will suggest ways to reach your calorie goal.  A healthy amount of weight to lose each week is usually 1-2 lb (0.5-0.9 kg). This usually means that your daily calorie intake should be reduced by 500-750 calories.  Eating 1,200-1,500 calories a day can help most women lose weight.  Eating 1,500-1,800 calories a day can help most men lose weight. What do I need to know about calorie counting? Work with your health care provider or dietitian to determine how many calories you should get each day. To meet your daily calorie goal, you will need to:  Find out how many calories are in each food that you would like to eat. Try to do this before you eat.  Decide how much of the food you plan to eat.  Keep a food log. Do this by writing down what you ate and how many calories it had. To successfully lose weight, it is important to balance calorie counting with a healthy lifestyle that includes regular activity. Where do I find calorie information? The number of calories in a food can be found on a Nutrition Facts label. If a food does not have a Nutrition Facts  label, try to look up the calories online or ask your dietitian for help. Remember that calories are listed per serving. If you choose to have more than one serving of a food, you will have to multiply the calories per serving by the number of servings you plan to eat. For example, the label on a package of bread might say that a serving size is 1 slice and that there are 90 calories in a serving. If you eat 1 slice, you will have eaten 90 calories. If you eat 2 slices, you will have eaten 180 calories.   How do I keep a food log? After each time that you eat, record the following in your food log as soon as possible:  What you ate. Be sure to include toppings, sauces, and other extras on the food.  How much you ate. This can be measured in cups, ounces, or number of items.  How many calories were in each food and drink.  The total number of calories in the food you ate. Keep your food log near you, such as in a pocket-sized notebook or on an app or website on your mobile phone. Some programs will calculate calories for you and show you how many calories you have left to meet your daily goal. What are some portion-control tips?  Know how many calories are in a serving. This will   help you know how many servings you can have of a certain food.  Use a measuring cup to measure serving sizes. You could also try weighing out portions on a kitchen scale. With time, you will be able to estimate serving sizes for some foods.  Take time to put servings of different foods on your favorite plates or in your favorite bowls and cups so you know what a serving looks like.  Try not to eat straight from a food's packaging, such as from a bag or box. Eating straight from the package makes it hard to see how much you are eating and can lead to overeating. Put the amount you would like to eat in a cup or on a plate to make sure you are eating the right portion.  Use smaller plates, glasses, and bowls for smaller  portions and to prevent overeating.  Try not to multitask. For example, avoid watching TV or using your computer while eating. If it is time to eat, sit down at a table and enjoy your food. This will help you recognize when you are full. It will also help you be more mindful of what and how much you are eating. What are tips for following this plan? Reading food labels  Check the calorie count compared with the serving size. The serving size may be smaller than what you are used to eating.  Check the source of the calories. Try to choose foods that are high in protein, fiber, and vitamins, and low in saturated fat, trans fat, and sodium. Shopping  Read nutrition labels while you shop. This will help you make healthy decisions about which foods to buy.  Pay attention to nutrition labels for low-fat or fat-free foods. These foods sometimes have the same number of calories or more calories than the full-fat versions. They also often have added sugar, starch, or salt to make up for flavor that was removed with the fat.  Make a grocery list of lower-calorie foods and stick to it. Cooking  Try to cook your favorite foods in a healthier way. For example, try baking instead of frying.  Use low-fat dairy products. Meal planning  Use more fruits and vegetables. One-half of your plate should be fruits and vegetables.  Include lean proteins, such as chicken, turkey, and fish. Lifestyle Each week, aim to do one of the following:  150 minutes of moderate exercise, such as walking.  75 minutes of vigorous exercise, such as running. General information  Know how many calories are in the foods you eat most often. This will help you calculate calorie counts faster.  Find a way of tracking calories that works for you. Get creative. Try different apps or programs if writing down calories does not work for you. What foods should I eat?  Eat nutritious foods. It is better to have a nutritious,  high-calorie food, such as an avocado, than a food with few nutrients, such as a bag of potato chips.  Use your calories on foods and drinks that will fill you up and will not leave you hungry soon after eating. ? Examples of foods that fill you up are nuts and nut butters, vegetables, lean proteins, and high-fiber foods such as whole grains. High-fiber foods are foods with more than 5 g of fiber per serving.  Pay attention to calories in drinks. Low-calorie drinks include water and unsweetened drinks. The items listed above may not be a complete list of foods and beverages you can eat.   Contact a dietitian for more information.   What foods should I limit? Limit foods or drinks that are not good sources of vitamins, minerals, or protein or that are high in unhealthy fats. These include:  Candy.  Other sweets.  Sodas, specialty coffee drinks, alcohol, and juice. The items listed above may not be a complete list of foods and beverages you should avoid. Contact a dietitian for more information. How do I count calories when eating out?  Pay attention to portions. Often, portions are much larger when eating out. Try these tips to keep portions smaller: ? Consider sharing a meal instead of getting your own. ? If you get your own meal, eat only half of it. Before you start eating, ask for a container and put half of your meal into it. ? When available, consider ordering smaller portions from the menu instead of full portions.  Pay attention to your food and drink choices. Knowing the way food is cooked and what is included with the meal can help you eat fewer calories. ? If calories are listed on the menu, choose the lower-calorie options. ? Choose dishes that include vegetables, fruits, whole grains, low-fat dairy products, and lean proteins. ? Choose items that are boiled, broiled, grilled, or steamed. Avoid items that are buttered, battered, fried, or served with cream sauce. Items labeled as  crispy are usually fried, unless stated otherwise. ? Choose water, low-fat milk, unsweetened iced tea, or other drinks without added sugar. If you want an alcoholic beverage, choose a lower-calorie option, such as a glass of wine or light beer. ? Ask for dressings, sauces, and syrups on the side. These are usually high in calories, so you should limit the amount you eat. ? If you want a salad, choose a garden salad and ask for grilled meats. Avoid extra toppings such as bacon, cheese, or fried items. Ask for the dressing on the side, or ask for olive oil and vinegar or lemon to use as dressing.  Estimate how many servings of a food you are given. Knowing serving sizes will help you be aware of how much food you are eating at restaurants. Where to find more information  Centers for Disease Control and Prevention: www.cdc.gov  [place] Department [employer] Agriculture: myplate.gov Summary  Calorie counting means keeping track of how many calories you eat and drink each day. If you eat fewer calories than your body needs, you should lose weight.  A healthy amount of weight to lose per week is usually 1-2 lb (0.5-0.9 kg). This usually means reducing your daily calorie intake by 500-750 calories.  The number of calories in a food can be found on a Nutrition Facts label. If a food does not have a Nutrition Facts label, try to look up the calories online or ask your dietitian for help.  Use smaller plates, glasses, and bowls for smaller portions and to prevent overeating.  Use your calories on foods and drinks that will fill you up and not leave you hungry shortly after a meal. This information is not intended to replace advice given to you by your health care provider. Make sure you discuss any questions you have with your health care provider. Document Revised: 08/17/2019 Document Reviewed: 08/17/2019 [employer] Patient Education  2021 [employer]  

## 2020-12-05 LAB — CUP PACEART REMOTE DEVICE CHECK
Date Time Interrogation Session: 20220515004706
Implantable Pulse Generator Implant Date: 20200220

## 2020-12-09 ENCOUNTER — Ambulatory Visit (INDEPENDENT_AMBULATORY_CARE_PROVIDER_SITE_OTHER): Payer: Medicare Other

## 2020-12-09 DIAGNOSIS — I639 Cerebral infarction, unspecified: Secondary | ICD-10-CM

## 2020-12-12 NOTE — Progress Notes (Signed)
HEMATOLOGY/ONCOLOGY CLINIC NOTE  Date of Service: .12/13/2020    Patient Care Team: Dorena Dew, FNP as PCP - General (Family Medicine) Werner Lean, MD as PCP - Cardiology (Cardiology) Valente David, RN as [facility]  CHIEF COMPLAINTS/PURPOSE OF CONSULTATION:  Followup of lung cancer  DIAGNOSIS 1. Right upper lobe T1b, N0, M0 primary lung adenocarcinoma diagnosed in December 2012. She was deemed not to be a surgical candidate. Treated by radiation oncology with SBRT at [facility]. Completed treatment in March 2013 and has been monitored since with no evidence of recurrence based on PET/CT scan done on 05/20/2015.  2.  Right anterior chest wall slowly growing masslike lesion in the right pectoralis minor muscle. Unclear etiology. Biopsy was done on 12/14/2013 at the [place] [name]. FNA showed skeletal muscles, macrophages and spindle cells some with cytologic atypia. Needle core biopsy showed soft tissue and skeletal muscle with extensive necrosis; rare spindle cells with cytologic atypia. Treatment effect causing reactive atypia cannot be ruled out. Clinical correlation was advised to ensure the sample is representative. PET/CT scan done here on 05/20/2015 shows no change in metabolic activity or size of the medial right chest wall mass suggesting benign etiology.  INTERVAL HISTORY  Pamela [name] is here for her scheduled follow-up. The patient's last visit with [place] was on 12/15/2019. The pt reports that she is doing well overall.  The pt reports that she is no longer on oxygen and her pulmonology follow ups are now annually. She has been doing well and notes no new issues or concerns.  The pt has had CT Chest ([id number]) on 11/20/2020, which revealed "1. Unchanged post treatment fibrosis, consolidation, and volume loss of the perihilar right upper lobe. 2. Stable small pulmonary nodules measuring 4 mm or smaller,  almost certainly benign and incidental. 3. Unchanged soft tissue mass of the right pectoralis major and minor musculature, measuring approximately 5.0 x 3.1 cm. This remains of uncertain nature."  Lab results today 12/13/2020 of CBC w/diff and CMP is as follows: all values are WNL except for Hgb of 11.3, MCV of 71.5, MCH of 22.3, Eosinophils Abs of 0.6K, BUN of 32, Creatinine of 1.81, Albumin of 3.4, Alkaline Phosphatase of 171, GFR est of 30.  On review of systems, pt denies fatigue, SOB, abdominal pain, back pain, and any other symptoms.   MEDICAL HISTORY:  Past Medical History:  Diagnosis Date  . Arthritis   . Asthma   . Cancer ([place])    lung, adenocarcinoma right lung 2012  . CHF (congestive heart failure) (HCC)    Preserved EF  . Congenital single kidney    With chronic kidney disease  . COPD (chronic obstructive pulmonary disease) ([place])   . Gout   . Hypertension   . Lung cancer [facility]) 2012   Right upper lobe lung adenocarcinoma diagnosed with needle biopsy treated by SBRT finished treatment April 2013 has been monitored since  . Mass of chest wall, right    Right chest wall mass 7.3 cm biopsy on 12/13/2013. Patient notes it was consistent with sarcoidosis but actual pathology results not available.  . Oxygen deficiency   . Sarcoidosis   . Sickle cell trait ([place])   . Sleep apnea    CKD ([place] 11/21/2015 showed no urinary tract abnormalities.) Gout on Uloric - tea as a trigger.  SURGICAL HISTORY: Past Surgical History:  Procedure Laterality Date  . LOOP RECORDER INSERTION N/A 09/08/2018   Procedure: LOOP  RECORDER INSERTION;  Surgeon: Evans Lance, MD;  Location: [place];  Service: Cardiovascular;  Laterality: N/A;  . LUNG BIOPSY    . TEE WITHOUT CARDIOVERSION N/A 09/08/2018   Procedure: TRANSESOPHAGEAL ECHOCARDIOGRAM (TEE);  Surgeon: Josue Hector, MD;  Location: [facility] ENDOSCOPY;  Service: Cardiovascular;  Laterality: N/A;  with loop  . TUBAL LIGATION    . VEIN  LIGATION AND STRIPPING      SOCIAL HISTORY: Social History   Socioeconomic History  . Marital status: Divorced    Spouse name: Not on file  . Number of children: 2  . Years of education: Not on file  . Highest education level: Not on file  Occupational History  . Not on file  Tobacco Use  . Smoking status: Never Smoker  . Smokeless tobacco: Never Used  Vaping Use  . Vaping Use: Never used  Substance and Sexual Activity  . Alcohol use: No    Alcohol/week: 0.0 standard drinks  . Drug use: No  . Sexual activity: Not Currently  Other Topics Concern  . Not on file  Social History Narrative   Lives with son.  One son is deceased.     Social Determinants of Health   Financial Resource Strain: Not on file  Food Insecurity: Food Insecurity Present  . Worried About Charity fundraiser in the Last Year: Sometimes true  . Ran Out of Food in the Last Year: Sometimes true  Transportation Needs: No Transportation Needs  . Lack of Transportation (Medical): No  . Lack of Transportation (Non-Medical): No  Physical Activity: Not on file  Stress: Not on file  Social Connections: Not on file  Intimate Partner Violence: Not on file    FAMILY HISTORY: Family History  Problem Relation Age of Onset  . Hypertension Mother   . Renal Disease Mother   . Cancer Father        stomach  . Heart disease Father        No details  . Cancer Brother   . Diabetes Brother   . Cervical cancer Sister   . Diabetes Sister   . Multiple myeloma Sister     ALLERGIES:  is allergic to sulfa antibiotics.  MEDICATIONS:  Current Outpatient Medications  Medication Sig Dispense Refill  . acetaminophen (TYLENOL) 500 MG tablet Take 1,000 mg by mouth every 6 (six) hours as needed for mild pain.     [name] Kitchen albuterol (PROAIR HFA) 108 (90 Base) MCG/ACT inhaler Inhale 2 puffs into the lungs every 6 (six) hours as needed for wheezing or shortness of breath. 8 g 3  . albuterol (PROVENTIL) (2.5 MG/3ML) 0.083%  nebulizer solution Take 3 mLs (2.5 mg total) by nebulization every 6 (six) hours as needed for wheezing or shortness of breath. 100 mL 2  . aspirin EC 81 MG tablet Take 1 tablet (81 mg total) by mouth daily. Swallow whole. 30 tablet 11  . atorvastatin (LIPITOR) 40 MG tablet Take 1 tablet (40 mg total) by mouth daily. 90 tablet 4  . budesonide-formoterol (SYMBICORT) 160-4.5 MCG/ACT inhaler Inhale 2 puffs into the lungs 2 (two) times daily. 18 g 5  . calcitRIOL (ROCALTROL) 0.25 MCG capsule Take 1 capsule (0.25 mcg total) by mouth daily. 90 capsule 2  . docusate sodium (COLACE) 100 MG capsule Take 1 capsule (100 mg total) by mouth 2 (two) times daily. 60 capsule 0  . famotidine (PEPCID) 20 MG tablet One after supper 30 tablet 11  . febuxostat (ULORIC) 40  MG tablet Take 1 tablet (40 mg total) by mouth daily. 90 tablet 1  . furosemide (LASIX) 40 MG tablet Take 1 tablet (40 mg total) by mouth 2 (two) times daily. For CHF 180 tablet 1  . magnesium oxide (MAG-OX) 400 (241.3 Mg) MG tablet Take 1 tablet (400 mg total) by mouth daily. (Patient not taking: Reported on 12/13/2020) 30 tablet 0  . montelukast (SINGULAIR) 10 MG tablet TAKE 1 TABLET BY MOUTH AT BEDTIME 90 tablet 0  . pantoprazole (PROTONIX) 40 MG tablet TAKE 1 TABLET BY MOUTH 30-60 MINUTES ONCE DAILY(FIRST MEAL OF THE DAY) (Patient not taking: No sig reported) 30 tablet 2  . potassium chloride SA (KLOR-CON) 20 MEQ tablet Take 1 tablet (20 mEq total) by mouth daily. 30 tablet 1   No current facility-administered medications for this visit.    REVIEW OF SYSTEMS:   10 Point review of Systems was done is negative except as noted above.  PHYSICAL EXAMINATION:  ECOG PERFORMANCE STATUS: 2-3  . Vitals:   12/13/20 1126  BP: 136/68  Pulse: 62  Resp: 18  Temp: 97.8 F (36.6 C)  SpO2: 98%   There were no vitals filed for this visit. .Body mass index is 55.46 kg/m.   . Wt Readings from Last 3 Encounters:  11/12/20 (!) 343 lb 9.6 oz (155.9  kg)  10/01/20 (!) 340 lb (154.2 kg)  09/24/20 (!) 341 lb (154.7 kg)   Exam was given in a chair.   GENERAL:alert, in no acute distress and comfortable SKIN: no acute rashes, no significant lesions EYES: conjunctiva are pink and non-injected, sclera anicteric OROPHARYNX: MMM, no exudates, no oropharyngeal erythema or ulceration NECK: supple, no JVD LYMPH:  no palpable lymphadenopathy in the cervical, axillary or inguinal regions LUNGS: clear to auscultation b/l with normal respiratory effort HEART: regular rate & rhythm ABDOMEN:  normoactive bowel sounds , non tender, not distended. Extremity: no pedal edema PSYCH: alert & oriented x 3 with fluent speech NEURO: no focal motor/sensory deficits  LABORATORY DATA:  I have reviewed the data as listed  CBC Latest Ref Rng & Units 12/13/2020 12/20/2019 12/19/2019  WBC 4.0 - 10.5 K/uL 4.7 6.4 9.5  Hemoglobin 12.0 - 15.0 g/dL 11.3(L) 12.0 11.8(L)  Hematocrit 36.0 - 46.0 % 36.2 38.3 38.4  Platelets 150 - 400 K/uL 157 171 182   . CBC    Component Value Date/Time   WBC 4.7 12/13/2020 0954   RBC 5.06 12/13/2020 0954   HGB 11.3 (L) 12/13/2020 0954   HGB 11.0 (L) 08/29/2019 0941   HGB 11.8 05/31/2017 1050   HCT 36.2 12/13/2020 0954   HCT 35.4 08/29/2019 0941   HCT 37.5 05/31/2017 1050   PLT 157 12/13/2020 0954   PLT 195 08/29/2019 0941   MCV 71.5 (L) 12/13/2020 0954   MCV 72 (L) 08/29/2019 0941   MCV 71.2 (L) 05/31/2017 1050   MCH 22.3 (L) 12/13/2020 0954   MCHC 31.2 12/13/2020 0954   RDW 14.9 12/13/2020 0954   RDW 16.2 (H) 08/29/2019 0941   RDW 15.1 (H) 05/31/2017 1050   LYMPHSABS 1.1 12/13/2020 0954   LYMPHSABS 1.0 08/29/2019 0941   LYMPHSABS 1.2 05/31/2017 1050   MONOABS 0.4 12/13/2020 0954   MONOABS 0.4 05/31/2017 1050   EOSABS 0.6 (H) 12/13/2020 0954   EOSABS 1.0 (H) 08/29/2019 0941   BASOSABS 0.0 12/13/2020 0954   BASOSABS 0.0 08/29/2019 0941   BASOSABS 0.0 05/31/2017 1050     . CMP Latest Ref Rng & Units  12/13/2020  10/01/2020 04/26/2020  Glucose 70 - 99 mg/dL 84 86 92  BUN 8 - 23 mg/dL 32(H) 27 61(H)  Creatinine 0.44 - 1.00 mg/dL 1.81(H) 1.72(H) 2.70(H)  Sodium 135 - 145 mmol/L 142 143 143  Potassium 3.5 - 5.1 mmol/L 3.7 4.3 3.6  Chloride 98 - 111 mmol/L 101 103 95(L)  CO2 22 - 32 mmol/L '28 23 29  ' Calcium 8.9 - 10.3 mg/dL 8.6(L) 8.8 9.3  Total Protein 6.5 - 8.1 g/dL 6.9 6.5 -  Total Bilirubin 0.3 - 1.2 mg/dL 0.4 0.4 -  Alkaline Phos 38 - 126 U/L 171(H) 176(H) -  AST 15 - 41 U/L 26 23 -  ALT 0 - 44 U/L 14 14 -    RADIOGRAPHIC STUDIES: I have personally reviewed the radiological images as listed and agreed with the findings in the report. CT Chest Wo Contrast  Result Date: 11/21/2020 CLINICAL DATA:  Non-small cell lung cancer EXAM: CT CHEST WITHOUT CONTRAST TECHNIQUE: Multidetector CT imaging of the chest was performed following the standard protocol without IV contrast. COMPARISON:  12/08/2019 FINDINGS: Cardiovascular: Scattered aortic atherosclerosis. Mild cardiomegaly. Implantable loop recorder. No pericardial effusion. Mediastinum/Nodes: No enlarged mediastinal, hilar, or axillary lymph nodes. Thyroid gland, trachea, and esophagus demonstrate no significant findings. Lungs/Pleura: Unchanged post treatment fibrosis, consolidation, and volume loss of the perihilar right upper lobe (series 5, image 55). Stable 4 mm nodule of the posterior right upper lobe (series 5, image 52). Stable 3 mm nodule of the left lung base (series 5, image 99). No pleural effusion or pneumothorax. Upper Abdomen: No acute abnormality. Musculoskeletal: No suspicious bone lesions identified. Unchanged soft tissue mass of the right pectoralis major and minor musculature, measuring approximately 5.0 x 3.1 cm (series 2, image 56). IMPRESSION: 1. Unchanged post treatment fibrosis, consolidation, and volume loss of the perihilar right upper lobe. 2. Stable small pulmonary nodules measuring 4 mm or smaller, almost certainly benign and  incidental. 3. Unchanged soft tissue mass of the right pectoralis major and minor musculature, measuring approximately 5.0 x 3.1 cm. This remains of uncertain nature. Aortic Atherosclerosis (ICD10-I70.0). Electronically Signed   By: Eddie Candle M.D.   On: 11/21/2020 10:59   CUP PACEART REMOTE DEVICE CHECK  Result Date: 12/05/2020 ILR summary report received. Battery status OK. Normal device function. No new symptom, tachy, brady, or pause episodes. No new AF episodes. Monthly summary reports and ROV/PRN. RP    ASSESSMENT & PLAN:   70 y.o. African-American female with   #1 History of right upper lobe T1b, N0, M0 primary lung adenocarcinoma most in December 2012. She was deemed not to be a surgical candidate. Treated by radiation oncology with SBRT at [facility]. Completed treatment in March 2013 and has been monitored since with no evidence of recurrence. no evidence of recurrence based on PET/CT scan done on 05/20/2015. On clinical visit today the patient has no new change in her breathing and no new focal symptoms.  Hemoglobin is stable.   CT chest 05/26/2016 shows no evidence of local recurrence of cancer or metastatic disease. CT Chest, 05/31/2017 shows no evidence of local recurrence of cancer or metastatic disease.   #2 Right anterior chest wall slowly growing masslike lesion in the right pectoralis minor muscle. Unclear etiology. FNA showed skeletal muscles, macrophages and spindle cells some with cytologic atypia. Needle core biopsy showed soft tissue and skeletal muscle with extensive necrosis; rare spindle cells with cytologic atypia. Treatment effect causing reactive atypia cannot be ruled out.  PET/CT  scan done here on 05/20/2015 shows no change in metabolic activity or size of the medial right chest wall mass suggesting benign etiology. Could certainly be related to her sarcoidosis . Previously discussed with the patient the option of biopsying the mass for a more definitive  diagnosis versus monitoring it. She chooses to monitor it at this time given that imaging findings suggest a likely benign etiology that hasnt changed over the last 18 months. CT chest 05/26/2016 shows that this lesion is stable compared to previous imaging. -CT Chest, 05/31/2017 shows that this lesion is stable. No locally recurrent or metastatic disease.   PLAN:  -Discussed pt labwork today, 12/13/2020; microcytic but other counts normal. Chemistries stable. -Discussed pt CT Chest ([id number]) on 11/20/2020; no new lung nodules or fluids. All unchanged. Some scarring from radiation. No new findings of cancer progression. -Recommended pt receive the second COVID booster shot as recently approved. Advised pt to wait 4-6 months following first booster shot before getting this. -No new signs of cancer or spread of cancer. -Since the pt has not shown any radiographic, clinical, or lab evidence of returned or progressive lung cancer after 5 years from completing her treatment, we will begin once a year follow ups. -Continue f/u with PCP, nephrologist, cardiologist, pulmonologist. -Will see back in 12 months with labs.    #3 history of extensive sarcoidosis involving lymphadenopathy in the chest, abdomen as well as spleen and possibly liver .previously treated with steroids by Dr. Bartholome Bill, pulmonary at [facility] .has recently established care with Dr. Melvyn Novas for her pulmonary and sarcoidosis management.   Plan -Continue followup with Dr. Melvyn Novas For further management   #4 history of sickle cell trait. Hemoglobin electrophoresis is not available. Hgb at 11.4 today.  Microcytosis likely from chronic disease from her sarcoidosis vs possible alpha thal trait. Ferritin levels WNL  Plan   -No indication for additional iron replacement at this time.  FOLLOW UP: RTC with Dr Irene Limbo with labs in 12 months CT chest in 11 months    The total time spent in the appt was 20 minutes and more than 50%  was on counseling and direct patient cares.  All of the patient's questions were answered with apparent satisfaction. The patient knows to call the clinic with any problems, questions or concerns.  Sullivan Lone MD [place] AAHIVMS [facility] Hematology/Oncology Physician [facility]  (Office):       [phone] (Work cell):  [phone] (Fax):           [phone]  I, Reinaldo Raddle, am acting as scribe for Dr. Sullivan Lone, MD.     .I have reviewed the above documentation for accuracy and completeness, and I agree with the above. Brunetta Genera MD

## 2020-12-13 ENCOUNTER — Other Ambulatory Visit: Payer: Self-pay

## 2020-12-13 ENCOUNTER — Inpatient Hospital Stay: Payer: Medicare Other | Attending: Hematology

## 2020-12-13 ENCOUNTER — Inpatient Hospital Stay (HOSPITAL_BASED_OUTPATIENT_CLINIC_OR_DEPARTMENT_OTHER): Payer: Medicare Other | Admitting: Hematology

## 2020-12-13 VITALS — BP 136/68 | HR 62 | Temp 97.8°F | Resp 18 | Ht 66.0 in

## 2020-12-13 DIAGNOSIS — C3490 Malignant neoplasm of unspecified part of unspecified bronchus or lung: Secondary | ICD-10-CM

## 2020-12-13 DIAGNOSIS — Z85118 Personal history of other malignant neoplasm of bronchus and lung: Secondary | ICD-10-CM | POA: Diagnosis not present

## 2020-12-13 DIAGNOSIS — Z7982 Long term (current) use of aspirin: Secondary | ICD-10-CM | POA: Insufficient documentation

## 2020-12-13 DIAGNOSIS — J449 Chronic obstructive pulmonary disease, unspecified: Secondary | ICD-10-CM | POA: Insufficient documentation

## 2020-12-13 DIAGNOSIS — Z9221 Personal history of antineoplastic chemotherapy: Secondary | ICD-10-CM | POA: Diagnosis not present

## 2020-12-13 DIAGNOSIS — Z79899 Other long term (current) drug therapy: Secondary | ICD-10-CM | POA: Diagnosis not present

## 2020-12-13 DIAGNOSIS — D573 Sickle-cell trait: Secondary | ICD-10-CM | POA: Diagnosis not present

## 2020-12-13 DIAGNOSIS — I110 Hypertensive heart disease with heart failure: Secondary | ICD-10-CM | POA: Diagnosis not present

## 2020-12-13 DIAGNOSIS — I509 Heart failure, unspecified: Secondary | ICD-10-CM | POA: Diagnosis not present

## 2020-12-13 DIAGNOSIS — G4730 Sleep apnea, unspecified: Secondary | ICD-10-CM | POA: Insufficient documentation

## 2020-12-13 DIAGNOSIS — Z7951 Long term (current) use of inhaled steroids: Secondary | ICD-10-CM | POA: Diagnosis not present

## 2020-12-13 DIAGNOSIS — D869 Sarcoidosis, unspecified: Secondary | ICD-10-CM | POA: Diagnosis not present

## 2020-12-13 LAB — CBC WITH DIFFERENTIAL/PLATELET
Abs Immature Granulocytes: 0.01 10*3/uL (ref 0.00–0.07)
Basophils Absolute: 0 10*3/uL (ref 0.0–0.1)
Basophils Relative: 1 %
Eosinophils Absolute: 0.6 10*3/uL — ABNORMAL HIGH (ref 0.0–0.5)
Eosinophils Relative: 14 %
HCT: 36.2 % (ref 36.0–46.0)
Hemoglobin: 11.3 g/dL — ABNORMAL LOW (ref 12.0–15.0)
Immature Granulocytes: 0 %
Lymphocytes Relative: 23 %
Lymphs Abs: 1.1 10*3/uL (ref 0.7–4.0)
MCH: 22.3 pg — ABNORMAL LOW (ref 26.0–34.0)
MCHC: 31.2 g/dL (ref 30.0–36.0)
MCV: 71.5 fL — ABNORMAL LOW (ref 80.0–100.0)
Monocytes Absolute: 0.4 10*3/uL (ref 0.1–1.0)
Monocytes Relative: 9 %
Neutro Abs: 2.5 10*3/uL (ref 1.7–7.7)
Neutrophils Relative %: 53 %
Platelets: 157 10*3/uL (ref 150–400)
RBC: 5.06 MIL/uL (ref 3.87–5.11)
RDW: 14.9 % (ref 11.5–15.5)
WBC: 4.7 10*3/uL (ref 4.0–10.5)
nRBC: 0 % (ref 0.0–0.2)

## 2020-12-13 LAB — CMP (CANCER CENTER ONLY)
ALT: 14 U/L (ref 0–44)
AST: 26 U/L (ref 15–41)
Albumin: 3.4 g/dL — ABNORMAL LOW (ref 3.5–5.0)
Alkaline Phosphatase: 171 U/L — ABNORMAL HIGH (ref 38–126)
Anion gap: 13 (ref 5–15)
BUN: 32 mg/dL — ABNORMAL HIGH (ref 8–23)
CO2: 28 mmol/L (ref 22–32)
Calcium: 8.6 mg/dL — ABNORMAL LOW (ref 8.9–10.3)
Chloride: 101 mmol/L (ref 98–111)
Creatinine: 1.81 mg/dL — ABNORMAL HIGH (ref 0.44–1.00)
GFR, Estimated: 30 mL/min — ABNORMAL LOW (ref >60)
Glucose, Bld: 84 mg/dL (ref 70–99)
Potassium: 3.7 mmol/L (ref 3.5–5.1)
Sodium: 142 mmol/L (ref 135–145)
Total Bilirubin: 0.4 mg/dL (ref 0.3–1.2)
Total Protein: 6.9 g/dL (ref 6.5–8.1)

## 2020-12-17 ENCOUNTER — Telehealth: Payer: Self-pay | Admitting: Hematology

## 2020-12-17 NOTE — Telephone Encounter (Signed)
Scheduled follow-up appointment per 5/27 los. Patient is aware.

## 2020-12-19 ENCOUNTER — Telehealth: Payer: Self-pay

## 2020-12-19 NOTE — Telephone Encounter (Signed)
Concentrate  [facility] on [address]

## 2020-12-20 ENCOUNTER — Other Ambulatory Visit: Payer: Self-pay | Admitting: Family Medicine

## 2020-12-20 MED ORDER — CALCITRIOL 0.25 MCG PO CAPS: 0.2500 ug | ORAL_CAPSULE | Freq: Every day | ORAL | 2 refills | Status: DC

## 2020-12-20 NOTE — Telephone Encounter (Signed)
Called patient to verify medication needed. Calcitriol refilled.

## 2020-12-23 ENCOUNTER — Other Ambulatory Visit: Payer: Self-pay | Admitting: Family Medicine

## 2020-12-23 ENCOUNTER — Telehealth: Payer: Self-pay

## 2020-12-23 DIAGNOSIS — J4530 Mild persistent asthma, uncomplicated: Secondary | ICD-10-CM

## 2020-12-23 NOTE — Telephone Encounter (Signed)
Rx refilled to [facility] on [address].

## 2020-12-23 NOTE — Telephone Encounter (Signed)
Symbicort 

## 2020-12-30 ENCOUNTER — Other Ambulatory Visit: Payer: Self-pay | Admitting: *Deleted

## 2020-12-30 ENCOUNTER — Other Ambulatory Visit: Payer: Self-pay | Admitting: Family Medicine

## 2020-12-30 ENCOUNTER — Telehealth: Payer: Self-pay

## 2020-12-30 DIAGNOSIS — I5030 Unspecified diastolic (congestive) heart failure: Secondary | ICD-10-CM

## 2020-12-30 DIAGNOSIS — M109 Gout, unspecified: Secondary | ICD-10-CM

## 2020-12-30 MED ORDER — FUROSEMIDE 40 MG PO TABS: ORAL_TABLET | ORAL | 1 refills | Status: DC

## 2020-12-30 MED ORDER — FEBUXOSTAT 40 MG PO TABS: 40.0000 mg | ORAL_TABLET | Freq: Every day | ORAL | 1 refills | Status: DC

## 2020-12-30 NOTE — Patient Outreach (Signed)
Pamela [name]) Care Management  12/30/2020  Pamela [name] April 06, 1951 [id number]   Outgoing call placed to member, no answer, unable to leave voice message.  Will follow up within the next 3-4 business days.  Valente David, [place], MSN [place] [phone]

## 2020-12-30 NOTE — Progress Notes (Signed)
Meds ordered this encounter  Medications   furosemide (LASIX) 40 MG tablet    Sig: TAKE 1 TABLET BY MOUTH TWICE DAILY FOR CHF    Dispense:  180 tablet    Refill:  1    Order Specific Question:   Supervising Provider    Answer:   Tresa Garter [[id number]]   febuxostat (ULORIC) 40 MG tablet    Sig: Take 1 tablet (40 mg total) by mouth daily.    Dispense:  90 tablet    Refill:  1    Order Specific Question:   Supervising Provider    Answer:   Tresa Garter [[id number]]     [name]  APRN, MSN, FNP-C Patient [place] [address] [place], [place] [address] [phone]

## 2020-12-30 NOTE — Telephone Encounter (Signed)
Gout medicine  furosemide

## 2020-12-31 NOTE — Progress Notes (Signed)
[facility] Summary Report / Loop Recorder 

## 2021-01-02 ENCOUNTER — Other Ambulatory Visit: Payer: Self-pay | Admitting: *Deleted

## 2021-01-02 NOTE — Patient Outreach (Signed)
[facility]) Care Management  01/02/2021  Pamela [name] 07-27-1950 [id number]   Outreach attempt #2, successful, state she is doing well.  Has normal swelling in legs/feet but report it is no worse than usual.  Denies any urgent concerns, encouraged to contact this care manager with questions.  Agrees to follow up within the next 3 months.   Goals Addressed             This Visit's Progress    [facility] - Set My Weight Loss Goal   On track    Follow Up Date 6/13  Timeframe:  Long-Range Goal Priority:  Medium Start Date:           5/17                  Expected End Date:  8/17                     Barriers: Health Behaviors    - set weight loss goal - lose 20 pounds   Why is this important?   Losing only 5 to 15 percent of your weight makes a big difference in your health.    Notes:   5/17 - Will provide member with education regarding weight loss.  6/16 - Verbalizes understanding regarding importance of daily weights.  Today was 341 pounds.  Monitoring but not recording.  Advised to record trends      [facility] - Track and Manage Fluids and Swelling-Heart Failure   On track    Timeframe:  Short-Term Goal Priority:  High Start Date:         5/17                    Expected End Date:       7/17  Barriers: Health Behaviors Knowledge    - call office if I gain more than 2 pounds in one day or 5 pounds in one week - keep legs up while sitting - track weight in diary - use salt in moderation    Why is this important?   It is important to check your weight daily and watch how much salt and liquids you have.  It will help you to manage your heart failure.    Notes:   5/17 - Will send member education regarding low salt and HF zones  6/16 - Confirms that she received information in mail.  Educated on management of daily fluid status and assessment of swelling in legs.  Has appointment with PCP on 6/21, will use [facility]        Valente David, RN, MSN [place] Manager [phone]

## 2021-01-06 ENCOUNTER — Ambulatory Visit (INDEPENDENT_AMBULATORY_CARE_PROVIDER_SITE_OTHER): Payer: Medicare Other

## 2021-01-06 DIAGNOSIS — I639 Cerebral infarction, unspecified: Secondary | ICD-10-CM | POA: Diagnosis not present

## 2021-01-06 LAB — CUP PACEART REMOTE DEVICE CHECK
Date Time Interrogation Session: 20220617005159
Implantable Pulse Generator Implant Date: 20200220

## 2021-01-07 ENCOUNTER — Ambulatory Visit (INDEPENDENT_AMBULATORY_CARE_PROVIDER_SITE_OTHER): Payer: Medicare Other | Admitting: Family Medicine

## 2021-01-07 ENCOUNTER — Encounter: Payer: Self-pay | Admitting: Family Medicine

## 2021-01-07 ENCOUNTER — Other Ambulatory Visit: Payer: Self-pay

## 2021-01-07 VITALS — BP 107/59 | HR 67 | Temp 97.2°F | Ht 67.0 in | Wt 339.1 lb

## 2021-01-07 DIAGNOSIS — I10 Essential (primary) hypertension: Secondary | ICD-10-CM | POA: Diagnosis not present

## 2021-01-07 DIAGNOSIS — R7303 Prediabetes: Secondary | ICD-10-CM

## 2021-01-07 DIAGNOSIS — N183 Chronic kidney disease, stage 3 unspecified: Secondary | ICD-10-CM | POA: Diagnosis not present

## 2021-01-07 DIAGNOSIS — I639 Cerebral infarction, unspecified: Secondary | ICD-10-CM

## 2021-01-07 DIAGNOSIS — Z6841 Body Mass Index (BMI) 40.0 and over, adult: Secondary | ICD-10-CM | POA: Diagnosis not present

## 2021-01-07 NOTE — Progress Notes (Signed)
Patient [facility]  Established Patient Office Visit  Subjective:  Patient ID: Pamela [name], female    DOB: 10-18-1950  Age: 70 y.o. MRN: [id number]  CC:  Chief Complaint  Patient presents with   Follow-up    Ringing in right ear    HPI Pamela [name] is a very pleasant 70 year old female with a medical history significant for essential hypertension, hypokalemia, history of lung adenocarcinoma history of CHF with preserved EF, chronic kidney disease stage III, sarcoidosis, COPD, and OSA presents for follow-up of chronic conditions.  Patient states that she is doing very well and has minimal complaints on today.  Patient states that her right ear has been ringing intermittently over the past several weeks.  Patient has not identified any provoking factors pertaining to ear ringing.  It is not ringing at present.  She does not to loud music, wearing headphones and has not had any issues with hearing in the past.  Patient does not use Q-tips.  Patient is also following up for chronic conditions.  She has been taking all prescribed medications without interruption.  Patient also has a number of specialist, she has been following up consistently. She has a history of sarcoidosis and is followed by pulmonology.  Sarcoidosis has been stable over the past several years.  He also has a history of lung cancer is followed by oncology every 6 months for surveillance  Patient has very well-controlled hypertension she generally does not follow a low-fat, low carbohydrate diet.  Severely obese and does not exercise.  She is very active.  She is very active, especially in her church. Patient has bilateral lower extremity edema with lichenification. She has been consistently taking furosemide with some improvement. Patient has periodic leg cramps primarily with laying down.     Past Medical History:  Diagnosis Date   Arthritis    Asthma    Cancer ([place])     lung, adenocarcinoma right lung 2012   CHF (congestive heart failure) (HCC)    Preserved EF   Congenital single kidney    With chronic kidney disease   COPD (chronic obstructive pulmonary disease) ([place])    Gout    Hypertension    Lung cancer ([place]) 2012   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 Patient notes it was consistent with sarcoidosis but actual pathology results not available.   Oxygen deficiency    Sarcoidosis    Sickle cell trait ([place])    Sleep apnea     Past Surgical History:  Procedure Laterality Date   LOOP RECORDER INSERTION N/A 09/08/2018   Procedure: LOOP RECORDER INSERTION;  Surgeon: Evans Lance, MD;  Location: [facility];  Service: Cardiovascular;  Laterality: N/A;   LUNG BIOPSY     TEE WITHOUT CARDIOVERSION N/A 09/08/2018   Procedure: TRANSESOPHAGEAL ECHOCARDIOGRAM (TEE);  Surgeon: Josue Hector, MD;  Location[facility] END[facility];  Service: Cardiovascular;  Laterality: N/A;  with loop   TUBAL LIGATION     VEIN LIGATION AND STRIPPING      Family History  Problem Relation Age of Onset   Hypertension Mother    Renal Disease Mother    Cancer Father        stomach   Heart disease Father  No details   Cancer Brother    Diabetes Brother    Cervical cancer Sister    Diabetes Sister    Multiple myeloma Sister     Social History   Socioeconomic History   Marital status: Divorced    Spouse name: Not on file   Number of children: 2   Years of education: Not on file   Highest education level: Not on file  Occupational History   Not on file  Tobacco Use   Smoking status: Never   Smokeless tobacco: Never  Vaping Use   Vaping Use: Never used  Substance and Sexual Activity   Alcohol use: No    Alcohol/week: 0.0 standard drinks   Drug use: No   Sexual activity: Not  Currently  Other Topics Concern   Not on file  Social History Narrative   Lives with son.  One son is deceased.     Social Determinants of Health   Financial Resource Strain: Not on file  Food Insecurity: Food Insecurity Present   Worried About [place] in the Last Year: Sometimes true   Ran Out of Food in the Last Year: Sometimes true  Transportation Needs: No Transportation Needs   Lack of Transportation (Medical): No   Lack of Transportation (Non-Medical): No  Physical Activity: Not on file  Stress: Not on file  Social Connections: Not on file  Intimate Partner Violence: Not on file    Outpatient Medications Prior to Visit  Medication Sig Dispense Refill   acetaminophen (TYLENOL) 500 MG tablet Take 1,000 mg by mouth every 6 (six) hours as needed for mild pain.      albuterol (PROAIR HFA) 108 (90 Base) MCG/ACT inhaler Inhale 2 puffs into the lungs every 6 (six) hours as needed for wheezing or shortness of breath. 8 g 3   albuterol (PROVENTIL) (2.5 MG/3ML) 0.083% nebulizer solution Take 3 mLs (2.5 mg total) by nebulization every 6 (six) hours as needed for wheezing or shortness of breath. 100 mL 2   aspirin EC 81 MG tablet Take 1 tablet (81 mg total) by mouth daily. Swallow whole. 30 tablet 11   atorvastatin (LIPITOR) 40 MG tablet Take 1 tablet (40 mg total) by mouth daily. 90 tablet 4   calcitRIOL (ROCALTROL) 0.25 MCG capsule Take 1 capsule (0.25 mcg total) by mouth daily. 90 capsule 2   docusate sodium (COLACE) 100 MG capsule Take 1 capsule (100 mg total) by mouth 2 (two) times daily. 60 capsule 0   febuxostat (ULORIC) 40 MG tablet Take 1 tablet (40 mg total) by mouth daily. 90 tablet 1   furosemide (LASIX) 40 MG tablet TAKE 1 TABLET BY MOUTH TWICE DAILY FOR CHF 180 tablet 1   magnesium oxide (MAG-OX) 400 (241.3 Mg) MG tablet Take 1 tablet (400 mg total) by mouth daily. 30 tablet 0   montelukast (SINGULAIR) 10 MG tablet TAKE 1 TABLET BY MOUTH AT BEDTIME 90 tablet 0    potassium chloride SA (KLOR-CON) 20 MEQ tablet Take 1 tablet (20 mEq total) by mouth daily. 30 tablet 1   SYMBICORT 160-4.5 MCG/ACT inhaler INHALE 2 PUFFS INTO THE LUNGS  TWICE DAILY 11 g 0   famotidine (PEPCID) 20 MG tablet One after supper 30 tablet 11   pantoprazole (PROTONIX) 40 MG tablet TAKE 1 TABLET BY MOUTH 30-60 MINUTES ONCE DAILY(FIRST MEAL OF THE DAY) (Patient not taking: No sig reported) 30 tablet 2   No facility-administered medications prior to visit.    Allergies  Allergen Reactions   Sulfa Antibiotics Hives and Rash    ROS Review of Systems  Constitutional: Negative.   HENT: Negative.    Respiratory: Negative.    Cardiovascular:  Positive for leg swelling.  Endocrine: Negative.   Genitourinary: Negative.   Musculoskeletal:  Positive for joint swelling.       Muscle cramps  Skin: Negative.   Hematological: Negative.      Objective:    Physical Exam Constitutional:      Appearance: She is obese.  HENT:     Mouth/Throat:     Mouth: Mucous membranes are moist.  Eyes:     Pupils: Pupils are equal, round, and reactive to light.  Cardiovascular:     Rate and Rhythm: Normal rate and regular rhythm.  Pulmonary:     Effort: Pulmonary effort is normal.  Skin:    General: Skin is warm.  Neurological:     General: No focal deficit present.  Psychiatric:        Mood and Affect: Mood normal.        Thought Content: Thought content normal.        Judgment: Judgment normal.    BP (!) 107/59 (BP Location: Right Arm, Patient Position: Sitting)   Pulse 67   Temp (!) 97.2 F (36.2 C)   Ht '5\' 7"'  (1.702 m)   Wt (!) 339 lb 0.8 oz (153.8 kg)   SpO2 96%   BMI 53.10 kg/m  Wt Readings from Last 3 Encounters:  01/07/21 (!) 339 lb 0.8 oz (153.8 kg)  11/12/20 (!) 343 lb 9.6 oz (155.9 kg)  10/01/20 (!) 340 lb (154.2 kg)     Health Maintenance Due  Topic Date Due   Zoster Vaccines- Shingrix (1 of 2) Never done   COVID-19 Vaccine (4 - Booster for Pfizer series)  10/06/2020    There are no preventive care reminders to display for this patient.  Lab Results  Component Value Date   TSH 3.934 05/21/2014   Lab Results  Component Value Date   WBC 4.7 12/13/2020   HGB 11.3 (L) 12/13/2020   HCT 36.2 12/13/2020   MCV 71.5 (L) 12/13/2020   PLT 157 12/13/2020   Lab Results  Component Value Date   NA 142 12/13/2020   K 3.7 12/13/2020   CHLORIDE 103 05/31/2017   CO2 28 12/13/2020   GLUCOSE 84 12/13/2020   BUN 32 (H) 12/13/2020   CREATININE 1.81 (H) 12/13/2020   BILITOT 0.4 12/13/2020   ALKPHOS 171 (H) 12/13/2020   AST 26 12/13/2020   ALT 14 12/13/2020   PROT 6.9 12/13/2020   ALBUMIN 3.4 (L) 12/13/2020   CALCIUM 8.6 (L) 12/13/2020   ANIONGAP 13 12/13/2020   EGFR 32 (L) 10/01/2020   Lab Results  Component Value Date   CHOL 130 03/01/2019   Lab Results  Component Value Date   HDL 48 03/01/2019   Lab Results  Component Value Date   LDLCALC 61 03/01/2019   Lab Results  Component Value Date   TRIG 105 03/01/2019   Lab Results  Component Value Date   CHOLHDL 2.7 03/01/2019   Lab Results  Component Value Date   HGBA1C 6.2 (H) 12/17/2019      Assessment & Plan:   Problem List Items Addressed This Visit   None Visit Diagnoses     Stage 3 chronic kidney disease, unspecified whether stage 3a or 3b CKD ([place])    -  Primary   Relevant Orders  Basic Metabolic Panel      1. Stage 3 chronic kidney disease, unspecified whether stage 3a or 3b CKD ([place]) Will review creatinine, BUN and GFR as results become available. Also, Patient is followed by nephrology. Advised to follow up as scheduled.   - Basic Metabolic Panel  2. Prediabetes Most recent hemoglobin a1c is 6.2. Patient will return for labs due to transportation constraints. She has to leave before labs are drawn.   3. Essential hypertension BP (!) 107/59 (BP Location: Right Arm, Patient Position: Sitting)   Pulse 67   Temp (!) 97.2 F (36.2 C)   Ht '5\' 7"'  (1.702 m)    Wt (!) 339 lb 0.8 oz (153.8 kg)   SpO2 96%   BMI 53.10 kg/m  - Continue medication, monitor blood pressure at home. Continue DASH diet.  Reminder to go to the ER if any CP, SOB, nausea, dizziness, severe HA, changes vision/speech, left arm numbness and tingling and jaw pain.    4. Class 3 severe obesity due to excess calories with serious comorbidity and body mass index (BMI) of 50.0 to 59.9 in adult [facility]) The patient is asked to make an attempt to improve diet and exercise patterns to aid in medical management of this problem.  Patient requesting completion of SCAT forms, completed during visit.    Follow-up: Return in about 3 months (around 04/09/2021) for diabetes, hypertension.    [name]  APRN, MSN, FNP-C Patient [facility] [address] [place], [place] [address] [phone]

## 2021-01-09 ENCOUNTER — Ambulatory Visit (INDEPENDENT_AMBULATORY_CARE_PROVIDER_SITE_OTHER): Payer: Medicare Other | Admitting: Podiatry

## 2021-01-09 ENCOUNTER — Telehealth: Payer: Self-pay | Admitting: Family Medicine

## 2021-01-09 ENCOUNTER — Other Ambulatory Visit: Payer: Self-pay

## 2021-01-09 DIAGNOSIS — B351 Tinea unguium: Secondary | ICD-10-CM | POA: Diagnosis not present

## 2021-01-09 DIAGNOSIS — Q828 Other specified congenital malformations of skin: Secondary | ICD-10-CM

## 2021-01-09 DIAGNOSIS — M79675 Pain in left toe(s): Secondary | ICD-10-CM | POA: Diagnosis not present

## 2021-01-09 DIAGNOSIS — M79674 Pain in right toe(s): Secondary | ICD-10-CM | POA: Diagnosis not present

## 2021-01-09 NOTE — Telephone Encounter (Signed)
Notify patient.  Advised to return to clinic in 1 week for basic metabolic panel.  Patient expressed understanding.  Pamela Pounds  APRN, MSN, FNP-C Patient [name] [address] [place], [place] [address] [phone]

## 2021-01-12 NOTE — Progress Notes (Signed)
Subjective: 70 y.o. returns the office today for painful, elongated, thickened toenails which she cannot trim herself and for a callus on the right heel. She states that the plantar fasciitis has been doing well and not having any significant issues with that at this time.   Objective: AAO 3, NAD DP/PT pulses palpable, CRT less than 3 seconds Chronic lower extremity edema. Nails hypertrophic, dystrophic, elongated, brittle, discolored 10. There is tenderness overlying the nails 1-5 bilaterally. There is no surrounding erythema or drainage along the nail sites. Minimal hyperkeratotic lesion right plantar heel without any underlying ulceration, drainage, or signs of infection.  No pain with calf compression, swelling, warmth, erythema.  Assessment: Patient presents with symptomatic onychomycosis, hyperkeratotic lesion.   Plan: -Treatment options including alternatives, risks, complications were discussed -Nails sharply debrided 10 without complication/bleeding. -Sharply debrided the minimal hyperkeratotic lesion x 1 without any complications or bleeding as a courtesy.  -Discussed daily foot inspection. If there are any changes, to call the office immediately.  -Follow-up in 3 months or sooner if any problems are to arise. In the meantime, encouraged to call the office with any questions, concerns, changes symptoms.  Trula Slade DPM

## 2021-01-13 ENCOUNTER — Telehealth: Payer: Self-pay

## 2021-01-13 NOTE — Telephone Encounter (Signed)
[facility] called about req form about cardio pulmonary testing   [phone]

## 2021-01-14 ENCOUNTER — Telehealth: Payer: Self-pay

## 2021-01-14 ENCOUNTER — Other Ambulatory Visit: Payer: Self-pay | Admitting: Family Medicine

## 2021-01-14 DIAGNOSIS — E876 Hypokalemia: Secondary | ICD-10-CM

## 2021-01-14 NOTE — Telephone Encounter (Signed)
Refills sent

## 2021-01-14 NOTE — Telephone Encounter (Signed)
Potassium 90 day supply

## 2021-01-15 ENCOUNTER — Other Ambulatory Visit: Payer: Medicare Other

## 2021-01-15 ENCOUNTER — Other Ambulatory Visit: Payer: Self-pay

## 2021-01-15 DIAGNOSIS — I10 Essential (primary) hypertension: Secondary | ICD-10-CM | POA: Diagnosis not present

## 2021-01-15 DIAGNOSIS — I5030 Unspecified diastolic (congestive) heart failure: Secondary | ICD-10-CM

## 2021-01-16 LAB — BASIC METABOLIC PANEL
BUN/Creatinine Ratio: 17 (ref 12–28)
BUN: 29 mg/dL — ABNORMAL HIGH (ref 8–27)
CO2: 26 mmol/L (ref 20–29)
Calcium: 8.6 mg/dL — ABNORMAL LOW (ref 8.7–10.3)
Chloride: 101 mmol/L (ref 96–106)
Creatinine, Ser: 1.72 mg/dL — ABNORMAL HIGH (ref 0.57–1.00)
Glucose: 85 mg/dL (ref 65–99)
Potassium: 3.9 mmol/L (ref 3.5–5.2)
Sodium: 143 mmol/L (ref 134–144)
eGFR: 32 mL/min/{1.73_m2} — ABNORMAL LOW (ref >59)

## 2021-01-17 ENCOUNTER — Telehealth: Payer: Self-pay

## 2021-01-23 NOTE — Telephone Encounter (Signed)
error 

## 2021-01-27 NOTE — Progress Notes (Signed)
[facility] Summary Report / Loop Recorder 

## 2021-02-03 ENCOUNTER — Other Ambulatory Visit: Payer: Self-pay | Admitting: Family Medicine

## 2021-02-03 ENCOUNTER — Telehealth: Payer: Self-pay | Admitting: Family Medicine

## 2021-02-03 DIAGNOSIS — J4530 Mild persistent asthma, uncomplicated: Secondary | ICD-10-CM

## 2021-02-03 MED ORDER — SYMBICORT 160-4.5 MCG/ACT IN AERO: 2.0000 | INHALATION_SPRAY | Freq: Two times a day (BID) | RESPIRATORY_TRACT | 6 refills | Status: DC

## 2021-02-03 NOTE — Telephone Encounter (Signed)
error 

## 2021-02-03 NOTE — Progress Notes (Signed)
Meds ordered this encounter  Medications   SYMBICORT 160-4.5 MCG/ACT inhaler    Sig: Inhale 2 puffs into the lungs 2 (two) times daily.    Dispense:  11 g    Refill:  6    Order Specific Question:   Supervising Provider    Answer:   Tresa Garter [[id number]]     [name]  APRN, MSN, FNP-C Patient [facility] [address] [place], [place] [address] [phone]

## 2021-02-06 LAB — CUP PACEART REMOTE DEVICE CHECK
Date Time Interrogation Session: 20220720005305
Implantable Pulse Generator Implant Date: 20200220

## 2021-02-10 ENCOUNTER — Ambulatory Visit (INDEPENDENT_AMBULATORY_CARE_PROVIDER_SITE_OTHER): Payer: Medicare Other

## 2021-02-10 DIAGNOSIS — I639 Cerebral infarction, unspecified: Secondary | ICD-10-CM | POA: Diagnosis not present

## 2021-02-11 ENCOUNTER — Other Ambulatory Visit: Payer: Self-pay | Admitting: Family Medicine

## 2021-02-11 ENCOUNTER — Telehealth: Payer: Self-pay

## 2021-02-11 DIAGNOSIS — E876 Hypokalemia: Secondary | ICD-10-CM

## 2021-02-11 NOTE — Telephone Encounter (Signed)
Patient requesting potassium, it has been sent into [facility]. Patient notified.

## 2021-02-11 NOTE — Telephone Encounter (Signed)
Med refill   Potzlmicroer  She spelled it this way and I didn't see it on med list

## 2021-02-19 DIAGNOSIS — D631 Anemia in chronic kidney disease: Secondary | ICD-10-CM | POA: Diagnosis not present

## 2021-02-19 DIAGNOSIS — N184 Chronic kidney disease, stage 4 (severe): Secondary | ICD-10-CM | POA: Diagnosis not present

## 2021-02-19 DIAGNOSIS — A0839 Other viral enteritis: Secondary | ICD-10-CM | POA: Diagnosis not present

## 2021-02-19 DIAGNOSIS — J449 Chronic obstructive pulmonary disease, unspecified: Secondary | ICD-10-CM | POA: Diagnosis not present

## 2021-02-19 DIAGNOSIS — N189 Chronic kidney disease, unspecified: Secondary | ICD-10-CM | POA: Diagnosis not present

## 2021-02-19 DIAGNOSIS — I5033 Acute on chronic diastolic (congestive) heart failure: Secondary | ICD-10-CM | POA: Diagnosis not present

## 2021-02-19 DIAGNOSIS — E8881 Metabolic syndrome: Secondary | ICD-10-CM | POA: Diagnosis not present

## 2021-02-19 DIAGNOSIS — N2581 Secondary hyperparathyroidism of renal origin: Secondary | ICD-10-CM | POA: Diagnosis not present

## 2021-02-19 DIAGNOSIS — N179 Acute kidney failure, unspecified: Secondary | ICD-10-CM | POA: Diagnosis not present

## 2021-02-19 DIAGNOSIS — M109 Gout, unspecified: Secondary | ICD-10-CM | POA: Diagnosis not present

## 2021-02-19 DIAGNOSIS — I639 Cerebral infarction, unspecified: Secondary | ICD-10-CM | POA: Diagnosis not present

## 2021-02-19 DIAGNOSIS — I129 Hypertensive chronic kidney disease with stage 1 through stage 4 chronic kidney disease, or unspecified chronic kidney disease: Secondary | ICD-10-CM | POA: Diagnosis not present

## 2021-03-03 ENCOUNTER — Telehealth: Payer: Self-pay | Admitting: Internal Medicine

## 2021-03-03 DIAGNOSIS — I5030 Unspecified diastolic (congestive) heart failure: Secondary | ICD-10-CM

## 2021-03-03 MED ORDER — FUROSEMIDE 40 MG PO TABS: 40.0000 mg | ORAL_TABLET | Freq: Three times a day (TID) | ORAL | 1 refills | Status: DC

## 2021-03-03 NOTE — Telephone Encounter (Signed)
Called [facility] pharmacy and informed them that Dr. Gasper Sells did not make changes to pt furosemide.  Per MD note changes were made by nephrology.  Pharmacy staff will follow up with pt and or provider.

## 2021-03-03 NOTE — Telephone Encounter (Signed)
Pt c/o medication issue:  1. Name of Medication:  furosemide (LASIX) 40 MG tablet  2. How are you currently taking this medication (dosage and times per day)?    3. Are you having a reaction (difficulty breathing--STAT)?    4. What is your medication issue?   Representative with [facility] is following up regarding the Rx received for this patient. She's requesting clarification on the instructions prior to distributing.

## 2021-03-03 NOTE — Telephone Encounter (Signed)
Nephrology increase lasix to 40 mg PO TID; reasonable based on our previous discussions.  Updating out documentation.

## 2021-03-06 DIAGNOSIS — Z23 Encounter for immunization: Secondary | ICD-10-CM | POA: Diagnosis not present

## 2021-03-07 NOTE — Progress Notes (Signed)
[facility] Summary Report / Loop Recorder 

## 2021-03-27 ENCOUNTER — Other Ambulatory Visit: Payer: Self-pay | Admitting: *Deleted

## 2021-03-27 NOTE — Patient Outreach (Signed)
[facility]) Care Management  [place]  03/27/2021   Pamela [name] 02-24-1951 [id number]   Outgoing call to member, state she is doing well.  Discussed transition to health coach for disease management, agrees.   Encounter Medications:  Outpatient Encounter Medications as of 03/27/2021  Medication Sig   acetaminophen (TYLENOL) 500 MG tablet Take 1,000 mg by mouth every 6 (six) hours as needed for mild pain.    albuterol (PROAIR HFA) 108 (90 Base) MCG/ACT inhaler Inhale 2 puffs into the lungs every 6 (six) hours as needed for wheezing or shortness of breath.   albuterol (PROVENTIL) (2.5 MG/3ML) 0.083% nebulizer solution Take 3 mLs (2.5 mg total) by nebulization every 6 (six) hours as needed for wheezing or shortness of breath.   aspirin EC 81 MG tablet Take 1 tablet (81 mg total) by mouth daily. Swallow whole.   atorvastatin (LIPITOR) 40 MG tablet Take 1 tablet (40 mg total) by mouth daily.   calcitRIOL (ROCALTROL) 0.25 MCG capsule Take 1 capsule (0.25 mcg total) by mouth daily.   docusate sodium (COLACE) 100 MG capsule Take 1 capsule (100 mg total) by mouth 2 (two) times daily.   febuxostat (ULORIC) 40 MG tablet Take 1 tablet (40 mg total) by mouth daily.   furosemide (LASIX) 40 MG tablet Take 1 tablet (40 mg total) by mouth 3 (three) times daily. TAKE 1 TABLET BY MOUTH TWICE DAILY FOR CHF   magnesium oxide (MAG-OX) 400 (241.3 Mg) MG tablet Take 1 tablet (400 mg total) by mouth daily.   montelukast (SINGULAIR) 10 MG tablet TAKE 1 TABLET BY MOUTH AT BEDTIME   potassium chloride SA (KLOR-CON) 20 MEQ tablet Take 1 tablet by mouth once daily   SYMBICORT 160-4.5 MCG/ACT inhaler Inhale 2 puffs into the lungs 2 (two) times daily.   No facility-administered encounter medications on file as of 03/27/2021.    Functional Status:  No flowsheet data found.  Fall/Depression Screening: Fall Risk  12/03/2020 04/09/2020 01/16/2020  Falls in the past year? 0 0 0  Number falls  in past yr: 0 0 -  Injury with Fall? 0 0 -  Risk for fall due to : - - -  Follow up - - Falls evaluation completed   PHQ 2/9 Scores 12/03/2020 01/16/2020 12/26/2019 10/24/2019 08/29/2019 05/30/2019 02/15/2019  PHQ - 2 Score 0 0 0 0 0 0 0    Assessment:   Care Plan Care Plan : Heart Failure (Adult)  Updates made by Valente David, RN since 03/27/2021 12:00 AM     Problem: Symptom Exacerbation (Heart Failure)      Goal: Symptom Exacerbation Prevented or Minimized Completed 03/27/2021  Start Date: 12/03/2020  Expected End Date: 01/02/2021  Recent Progress: On track  Priority: High  Note:   Evidence-based guidance:  Perform or review cognitive and/or health literacy screening.  Assess understanding of adherence and barriers to treatment plan, as well as lifestyle changes; develop strategies to address barriers.  Establish a mutually-agreed-upon early intervention process to communicate with primary care provider when signs/symptoms worsen.  Facilitate timely posthospital discharge or emergency department treatment that includes intensive follow-up via telephone calls, home visit, telehealth monitoring and care at multidisciplinary heart failure clinic.  Adjust frequency and intensity of follow-up based on presentation, number of emergency department visits, hospital admissions and frequency and severity of symptom exacerbation.  Facilitate timely visit, usually within 1 week, with primary care provider following hospital discharge.  Collaborate with clinical pharmacist to address adverse drug reactions,  drug interactions, subtherapeutic dosage, patient and family education.  Regularly screen for presence of depressive symptoms using a validated tool; consider pharmacologic therapy and/or referral for cognitive behavioral therapy when present.  Refer to community-based services, such as a heart failure support group, community Economist or peer support program.  Review immunization status; arrange  receipt of needed vaccinations.  Prepare patient for home oxygen use based on signs/symptoms.   Notes:     Task: Identify and Minimize Risk of Heart Failure Exacerbation Completed 03/27/2021  Due Date: 01/02/2021  Note:   Care Management Activities:    - barriers to lifestyle changes reviewed and addressed - cognitive screening completed and reviewed - depression screen reviewed - healthy lifestyle promoted    Notes:     Problem: Disease Progression (Heart Failure)      Long-Range Goal: Health Optimized   Start Date: 12/03/2020  Expected End Date: 06/05/2021  This Visit's Progress: On track  Recent Progress: On track  Priority: Medium  Note:   Evidence-based guidance:  Use brief intervention, such as 5 A's (Ask, Advise, Assess, Assist, Arrange) to encourage smoking cessation; refer to smoking cessation program, if ready for more intensive intervention.  Perform or refer to a registered dietitian for a nutrition assessment and nutrition-focused physical exam.   Identify potential micronutrient deficiencies, such as iron, vitamin D and thiamin.  Assess need for potential diet and fluid modification, such as reduced sodium or fluid intake.  Minimize unnecessary dietary restrictions to increase oral intake. Note: Sodium restriction should be individualized to the patient and clinical status.  Facilitate home monitoring of weight.   Notes:     Task: Optimize Health   Due Date: 06/05/2021  Note:   Care Management Activities:    - fluid modification encouraged - home monitoring of blood pressure encouraged - optimal nutrition intake promoted    Notes:       Goals Addressed             This Visit's Progress    [facility] - Set My Weight Loss Goal   On track      Timeframe:  Long-Range Goal Priority:  Medium Start Date:           5/17                  Expected End Date:  11/17                Barriers: Health Behaviors    - set weight loss goal - lose 20 pounds    Why is this important?   Losing only 5 to 15 percent of your weight makes a big difference in your health.    Notes:   5/17 - Will provide member with education regarding weight loss.  6/16 - Verbalizes understanding regarding importance of daily weights.  Today was 341 pounds.  Monitoring but not recording.  Advised to record trends  9/8 - Report weight today was 344 pounds.  State she has been as low as 335, but has fluctuated back up, not going over 347.  She is watching her diet/salt intake.  Will increase exercise/walking to help achieve goal     [facility] - Track and Manage Fluids and Swelling-Heart Failure   On track    Timeframe:  Short-Term Goal Priority:  High Start Date:         5/17  Expected End Date:       10/17  Barriers: Health Behaviors Knowledge    - call office if I gain more than 2 pounds in one day or 5 pounds in one week - keep legs up while sitting - track weight in diary - use salt in moderation    Why is this important?   It is important to check your weight daily and watch how much salt and liquids you have.  It will help you to manage your heart failure.    Notes:   5/17 - Will send member education regarding low salt and HF zones  6/16 - Confirms that she received information in mail.  Educated on management of daily fluid status and assessment of swelling in legs.  Has appointment with PCP on 6/21, will use SCAT  9/7 - Denies swelling, Lasix increased to 3 times a day.  Follow up with PCP on 9/23        Plan:  Follow-up: Patient agrees to Care Plan and Follow-up..  Will place referral to health coach.  Valente David, [place], MSN [facility] [phone]

## 2021-04-01 ENCOUNTER — Other Ambulatory Visit: Payer: Self-pay | Admitting: Family Medicine

## 2021-04-01 ENCOUNTER — Telehealth: Payer: Self-pay

## 2021-04-01 DIAGNOSIS — E876 Hypokalemia: Secondary | ICD-10-CM

## 2021-04-01 NOTE — Telephone Encounter (Signed)
LVM to have pt call back to schedule AWV.   RE: confirm insurance and schedule AWV on my schedule if times are convenient for patient or other AWV schedule as template permits.   

## 2021-04-02 ENCOUNTER — Telehealth: Payer: Self-pay

## 2021-04-02 ENCOUNTER — Other Ambulatory Visit: Payer: Self-pay | Admitting: *Deleted

## 2021-04-02 NOTE — Telephone Encounter (Signed)
Potassium 20 mg

## 2021-04-02 NOTE — Telephone Encounter (Signed)
Rx refilled.

## 2021-04-08 ENCOUNTER — Ambulatory Visit: Payer: Medicare Other | Admitting: Family Medicine

## 2021-04-10 ENCOUNTER — Ambulatory Visit (INDEPENDENT_AMBULATORY_CARE_PROVIDER_SITE_OTHER): Payer: Medicare Other | Admitting: Family Medicine

## 2021-04-10 ENCOUNTER — Encounter: Payer: Self-pay | Admitting: Family Medicine

## 2021-04-10 ENCOUNTER — Other Ambulatory Visit: Payer: Self-pay

## 2021-04-10 VITALS — BP 123/57 | HR 63 | Temp 97.2°F | Ht 66.0 in | Wt 349.2 lb

## 2021-04-10 DIAGNOSIS — M549 Dorsalgia, unspecified: Secondary | ICD-10-CM | POA: Diagnosis not present

## 2021-04-10 DIAGNOSIS — M25511 Pain in right shoulder: Secondary | ICD-10-CM

## 2021-04-10 DIAGNOSIS — I10 Essential (primary) hypertension: Secondary | ICD-10-CM

## 2021-04-10 DIAGNOSIS — N183 Chronic kidney disease, stage 3 unspecified: Secondary | ICD-10-CM | POA: Diagnosis not present

## 2021-04-10 DIAGNOSIS — G8929 Other chronic pain: Secondary | ICD-10-CM

## 2021-04-10 DIAGNOSIS — M25512 Pain in left shoulder: Secondary | ICD-10-CM

## 2021-04-10 DIAGNOSIS — N6489 Other specified disorders of breast: Secondary | ICD-10-CM

## 2021-04-10 DIAGNOSIS — R7303 Prediabetes: Secondary | ICD-10-CM | POA: Diagnosis not present

## 2021-04-10 DIAGNOSIS — I639 Cerebral infarction, unspecified: Secondary | ICD-10-CM | POA: Diagnosis not present

## 2021-04-10 DIAGNOSIS — R922 Inconclusive mammogram: Secondary | ICD-10-CM

## 2021-04-10 LAB — POCT URINALYSIS DIP (CLINITEK)
Bilirubin, UA: NEGATIVE
Blood, UA: NEGATIVE
Glucose, UA: NEGATIVE mg/dL
Ketones, POC UA: NEGATIVE mg/dL
Leukocytes, UA: NEGATIVE
Nitrite, UA: NEGATIVE
POC PROTEIN,UA: NEGATIVE
Spec Grav, UA: 1.015 (ref 1.010–1.025)
Urobilinogen, UA: 0.2 E.U./dL
pH, UA: 5.5 (ref 5.0–8.0)

## 2021-04-10 LAB — POCT GLYCOSYLATED HEMOGLOBIN (HGB A1C)
HbA1c POC (<> result, manual entry): 5.5 % (ref 4.0–5.6)
HbA1c, POC (controlled diabetic range): 5.5 % (ref 0.0–7.0)
HbA1c, POC (prediabetic range): 5.5 % — AB (ref 5.7–6.4)
Hemoglobin A1C: 5.5 % (ref 4.0–5.6)

## 2021-04-10 NOTE — Patient Instructions (Addendum)
Referral to plastic surgery for possible breast reduction

## 2021-04-10 NOTE — Progress Notes (Signed)
Patient [facility]   Established Patient Office Visit  Subjective:  Patient ID: Zeynab Klett, female    DOB: [birth year]  Age: 70 y.o. MRN: [id number]  CC:  Chief Complaint  Patient presents with   Follow-up    HPI Eiman Maret is a 70 year old female with a medical history significant for essential hypertension, stage III chronic kidney disease, chronic diastolic CHF, chronic respiratory failure with hypoxia, history of COPD, history of stroke, history of lung cancer, and history of anemia of chronic disease presents for follow-up of chronic conditions.  Patient also has a complaint of "heavy breasts".  She states that her breasts feel heavy and she has a difficult time finding brassieres to fit and is noticing increased divots to shoulders bilaterally.  Patient is also complaining of bilateral shoulder pain and upper back pain.  She believes that shoulder pain and back pain are provoked by her heavy dense breast tissue.  Patient is requesting a consult for possible breast reduction.  Patient has had ongoing shoulder pain over the past year.  She has been referred to orthopedic services.  Patient also underwent physical therapy for persistent left shoulder pain. Patient also has a history of essential hypertension.  She has been taking all prescribed medications consistently.  She is not following a low-fat, low carbohydrate diet over small meals as discussed previously.  Patient does not exercise due to history of COPD and chronic shortness of breath.  Patient is also on supplemental home oxygen.  She denies headache, blurred vision, chest pain, dizziness, nausea, vomiting, or diarrhea.  Patient has a history of stage III chronic kidney disease, she is followed by nephrology.   Past Medical History:  Diagnosis Date   Arthritis    Asthma    Cancer ([place])    lung, adenocarcinoma right lung 2012   CHF (congestive heart failure) (HCC)    Preserved EF    Congenital single kidney    With chronic kidney disease   COPD (chronic obstructive pulmonary disease) ([place])    Gout    Hypertension    Lung cancer ([place]) 2012   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 Patient notes it was consistent with sarcoidosis but actual pathology results not available.   Oxygen deficiency    Sarcoidosis    Sickle cell trait ([place])    Sleep apnea     Past Surgical History:  Procedure Laterality Date   LOOP RECORDER INSERTION N/A 09/08/2018   Procedure: LOOP RECORDER INSERTION;  Surgeon: Evans Lance, MD;  Location: [facility];  Service: Cardiovascular;  Laterality: N/A;   LUNG BIOPSY     TEE WITHOUT CARDIOVERSION N/A 09/08/2018   Procedure: TRANSESOPHAGEAL ECHOCARDIOGRAM (TEE);  Surgeon: Josue Hector, MD;  Location: [facility] ENDOSCOPY;  Service: Cardiovascular;  Laterality: N/A;  with loop   TUBAL LIGATION     VEIN LIGATION AND STRIPPING      Family History  Problem Relation Age of Onset   Hypertension Mother    Renal Disease Mother    Cancer Father        stomach   Heart disease Father        No details   Cancer Brother    Diabetes Brother    Cervical cancer Sister  Diabetes Sister    Multiple myeloma Sister     Social History   Socioeconomic History   Marital status: Divorced    Spouse name: Not on file   Number of children: 2   Years of education: Not on file   Highest education level: Not on file  Occupational History   Not on file  Tobacco Use   Smoking status: Never   Smokeless tobacco: Never  Vaping Use   Vaping Use: Never used  Substance and Sexual Activity   Alcohol use: No    Alcohol/week: 0.0 standard drinks   Drug use: No   Sexual activity: Not Currently  Other Topics Concern   Not on file  Social History Narrative   Lives with son.   One son is deceased.     Social Determinants of Health   Financial Resource Strain: Not on file  Food Insecurity: Food Insecurity Present   Worried About [place] in the Last Year: Sometimes true   Ran Out of Food in the Last Year: Sometimes true  Transportation Needs: No Transportation Needs   Lack of Transportation (Medical): No   Lack of Transportation (Non-Medical): No  Physical Activity: Not on file  Stress: Not on file  Social Connections: Not on file  Intimate Partner Violence: Not on file    Outpatient Medications Prior to Visit  Medication Sig Dispense Refill   acetaminophen (TYLENOL) 500 MG tablet Take 1,000 mg by mouth every 6 (six) hours as needed for mild pain.      albuterol (PROAIR HFA) 108 (90 Base) MCG/ACT inhaler Inhale 2 puffs into the lungs every 6 (six) hours as needed for wheezing or shortness of breath. 8 g 3   albuterol (PROVENTIL) (2.5 MG/3ML) 0.083% nebulizer solution Take 3 mLs (2.5 mg total) by nebulization every 6 (six) hours as needed for wheezing or shortness of breath. 100 mL 2   aspirin EC 81 MG tablet Take 1 tablet (81 mg total) by mouth daily. Swallow whole. 30 tablet 11   atorvastatin (LIPITOR) 40 MG tablet Take 1 tablet (40 mg total) by mouth daily. 90 tablet 4   calcitRIOL (ROCALTROL) 0.25 MCG capsule Take 1 capsule (0.25 mcg total) by mouth daily. 90 capsule 2   docusate sodium (COLACE) 100 MG capsule Take 1 capsule (100 mg total) by mouth 2 (two) times daily. 60 capsule 0   famotidine (PEPCID) 20 MG tablet SMARTSIG:1 Tablet(s) By Mouth Every Evening     febuxostat (ULORIC) 40 MG tablet Take 1 tablet (40 mg total) by mouth daily. 90 tablet 1   furosemide (LASIX) 40 MG tablet Take 1 tablet (40 mg total) by mouth 3 (three) times daily. TAKE 1 TABLET BY MOUTH TWICE DAILY FOR CHF 180 tablet 1   magnesium oxide (MAG-OX) 400 (241.3 Mg) MG tablet Take 1 tablet (400 mg total) by mouth daily. 30 tablet 0   montelukast (SINGULAIR) 10 MG tablet  TAKE 1 TABLET BY MOUTH AT BEDTIME 90 tablet 0   potassium chloride SA (KLOR-CON) 20 MEQ tablet TAKE 1  BY MOUTH ONCE DAILY 30 tablet 0   SYMBICORT 160-4.5 MCG/ACT inhaler Inhale 2 puffs into the lungs 2 (two) times daily. 11 g 6   No facility-administered medications prior to visit.    Allergies  Allergen Reactions   Sulfa Antibiotics Hives and Rash    ROS Review of Systems  Constitutional: Negative.  Negative for fatigue and fever.  HENT: Negative.    Eyes: Negative.  Respiratory: Negative.    Cardiovascular: Negative.   Gastrointestinal: Negative.   Endocrine: Negative.  Negative for polydipsia, polyphagia and polyuria.  Genitourinary: Negative.   Musculoskeletal:  Positive for back pain.  Neurological: Negative.   Psychiatric/Behavioral: Negative.       Objective:    Physical Exam Constitutional:      Appearance: She is obese.  Eyes:     Pupils: Pupils are equal, round, and reactive to light.  Cardiovascular:     Pulses: Normal pulses.  Pulmonary:     Effort: Pulmonary effort is normal.  Abdominal:     General: Bowel sounds are normal.  Skin:    General: Skin is warm.  Neurological:     General: No focal deficit present.     Mental Status: Mental status is at baseline.  Psychiatric:        Mood and Affect: Mood normal.        Behavior: Behavior normal.        Thought Content: Thought content normal.        Judgment: Judgment normal.    Wt (!) 349 lb 3.2 oz (158.4 kg)   BMI 54.69 kg/m  Wt Readings from Last 3 Encounters:  04/10/21 (!) 349 lb 3.2 oz (158.4 kg)  01/07/21 (!) 339 lb 0.8 oz (153.8 kg)  11/12/20 (!) 343 lb 9.6 oz (155.9 kg)     Health Maintenance Due  Topic Date Due   Zoster Vaccines- Shingrix (1 of 2) Never done   COVID-19 Vaccine (4 - Booster for Pfizer series) 09/30/2020    There are no preventive care reminders to display for this patient.  Lab Results  Component Value Date   TSH 3.934 05/21/2014   Lab Results  Component  Value Date   WBC 4.7 12/13/2020   HGB 11.3 (L) 12/13/2020   HCT 36.2 12/13/2020   MCV 71.5 (L) 12/13/2020   PLT 157 12/13/2020   Lab Results  Component Value Date   NA 143 01/15/2021   K 3.9 01/15/2021   CHLORIDE 103 05/31/2017   CO2 26 01/15/2021   GLUCOSE 85 01/15/2021   BUN 29 (H) 01/15/2021   CREATININE 1.72 (H) 01/15/2021   BILITOT 0.4 12/13/2020   ALKPHOS 171 (H) 12/13/2020   AST 26 12/13/2020   ALT 14 12/13/2020   PROT 6.9 12/13/2020   ALBUMIN 3.4 (L) 12/13/2020   CALCIUM 8.6 (L) 01/15/2021   ANIONGAP 13 12/13/2020   EGFR 32 (L) 01/15/2021   Lab Results  Component Value Date   CHOL 130 03/01/2019   Lab Results  Component Value Date   HDL 48 03/01/2019   Lab Results  Component Value Date   LDLCALC 61 03/01/2019   Lab Results  Component Value Date   TRIG 105 03/01/2019   Lab Results  Component Value Date   CHOLHDL 2.7 03/01/2019   Lab Results  Component Value Date   HGBA1C 6.2 (H) 12/17/2019      Assessment & Plan:   Problem List Items Addressed This Visit       Cardiovascular and Mediastinum   Essential hypertension   Relevant Orders   POCT URINALYSIS DIP (CLINITEK)     Other   Prediabetes - Primary   Relevant Orders   POCT URINALYSIS DIP (CLINITEK)   HgB A1c   1. Prediabetes Patient's hemoglobin A1c is 5.5, which is much improved.  Patient encouraged to continue carbohydrate modified diet divided over small meals throughout the day.  Also, make small attempts to increase  daily physical activity. - POCT URINALYSIS DIP (CLINITEK) - HgB A7H - Basic Metabolic Panel  2. Essential hypertension BP (!) 123/57   Pulse 63   Temp (!) 97.2 F (36.2 C)   Ht _0  (1.676 m)   Wt (!) 349 lb 3.2 oz (158.4 kg)   SpO2 98%   BMI 56.36 kg/m  - Continue medication, monitor blood pressure at home. Continue DASH diet.  Reminder to go to the ER if any CP, SOB, nausea, dizziness, severe HA, changes vision/speech, left arm numbness and tingling and jaw  pain.   - POCT URINALYSIS DIP (CLINITEK) - Basic Metabolic Panel  3. Stage 3 chronic kidney disease, unspecified whether stage 3a or 3b CKD ([place]) Reviewed previous serum creatinine, was consistent with patient's baseline.  Patient is followed by nephrology.  Will fax results to [facility] as they become available. - Basic Metabolic Panel  4. Chronic pain of both shoulders Patient is advised to continue follow-up with orthopedic specialist.  She recently received steroid injection to right shoulder and may warrant in the left shoulder.  She continues to have ongoing left shoulder pain.  5. Bilateral pendulous breasts Patient has heavy dense breast tissue and is requesting a breast reduction.  We will send a referral to plastic surgery for further work-up and evaluation. - Ambulatory referral to Plastic Surgery  6. Upper back pain, chronic  - Ambulatory referral to Plastic Surgery  7. Dense breast tissue  - Ambulatory referral to Plastic Surgery   Follow-up: Return in about 3 months (around 07/10/2021) for hypertension.    [name]  APRN, MSN, FNP-C Patient [facility] [address] [place],  [address] [phone]

## 2021-04-11 LAB — BASIC METABOLIC PANEL
BUN/Creatinine Ratio: 22 (ref 12–28)
BUN: 46 mg/dL — ABNORMAL HIGH (ref 8–27)
CO2: 24 mmol/L (ref 20–29)
Calcium: 9.1 mg/dL (ref 8.7–10.3)
Chloride: 99 mmol/L (ref 96–106)
Creatinine, Ser: 2.08 mg/dL — ABNORMAL HIGH (ref 0.57–1.00)
Glucose: 84 mg/dL (ref 65–99)
Potassium: 4.1 mmol/L (ref 3.5–5.2)
Sodium: 142 mmol/L (ref 134–144)
eGFR: 25 mL/min/{1.73_m2} — ABNORMAL LOW (ref >59)

## 2021-04-16 ENCOUNTER — Other Ambulatory Visit: Payer: Self-pay | Admitting: Internal Medicine

## 2021-04-21 ENCOUNTER — Ambulatory Visit: Payer: Medicare Other | Admitting: Podiatry

## 2021-04-21 ENCOUNTER — Ambulatory Visit (INDEPENDENT_AMBULATORY_CARE_PROVIDER_SITE_OTHER): Payer: Medicare Other

## 2021-04-21 DIAGNOSIS — R7309 Other abnormal glucose: Secondary | ICD-10-CM | POA: Diagnosis not present

## 2021-04-21 DIAGNOSIS — H25013 Cortical age-related cataract, bilateral: Secondary | ICD-10-CM | POA: Diagnosis not present

## 2021-04-21 DIAGNOSIS — I639 Cerebral infarction, unspecified: Secondary | ICD-10-CM

## 2021-04-21 DIAGNOSIS — H2513 Age-related nuclear cataract, bilateral: Secondary | ICD-10-CM | POA: Diagnosis not present

## 2021-04-21 DIAGNOSIS — H35033 Hypertensive retinopathy, bilateral: Secondary | ICD-10-CM | POA: Diagnosis not present

## 2021-04-21 LAB — CUP PACEART REMOTE DEVICE CHECK
Date Time Interrogation Session: 20220924032756
Implantable Pulse Generator Implant Date: 20200220

## 2021-04-22 ENCOUNTER — Ambulatory Visit (INDEPENDENT_AMBULATORY_CARE_PROVIDER_SITE_OTHER): Payer: Medicare Other | Admitting: Podiatry

## 2021-04-22 ENCOUNTER — Other Ambulatory Visit: Payer: Self-pay

## 2021-04-22 ENCOUNTER — Encounter: Payer: Self-pay | Admitting: Podiatry

## 2021-04-22 DIAGNOSIS — M79675 Pain in left toe(s): Secondary | ICD-10-CM

## 2021-04-22 DIAGNOSIS — Q828 Other specified congenital malformations of skin: Secondary | ICD-10-CM | POA: Diagnosis not present

## 2021-04-22 DIAGNOSIS — B351 Tinea unguium: Secondary | ICD-10-CM

## 2021-04-22 DIAGNOSIS — M79674 Pain in right toe(s): Secondary | ICD-10-CM

## 2021-04-23 NOTE — Progress Notes (Signed)
Subjective: 70 y.o. returns the office today for painful, elongated, thickened toenails which she cannot trim herself and for a callus on the right heel.  Otherwise has been doing well for any new issues.    Objective: AAO 3, NAD DP/PT pulses palpable, CRT less than 3 seconds Chronic lower extremity edema. Nails hypertrophic, dystrophic, elongated, brittle, discolored 10. There is tenderness overlying the nails 1-5 bilaterally. There is no surrounding erythema or drainage along the nail sites. Hyperkeratotic tissue present bilateral plantar heel without any underlying ulceration, drainage, or signs of infection.  There are no open lesions or any other areas of discomfort identified today. No pain with calf compression, swelling, warmth, erythema.  Assessment: Patient presents with symptomatic onychomycosis, hyperkeratotic lesion.   Plan: -Treatment options including alternatives, risks, complications were discussed -Nails sharply debrided 10 without complication/bleeding. -Sharply debrided the minimal hyperkeratotic lesion x 2 without any complications or bleeding as a courtesy.  -Discussed daily foot inspection. If there are any changes, to call the office immediately.  -Follow-up in 3 months or sooner if any problems are to arise. In the meantime, encouraged to call the office with any questions, concerns, changes symptoms.  Trula Slade DPM

## 2021-04-24 ENCOUNTER — Other Ambulatory Visit: Payer: Self-pay | Admitting: *Deleted

## 2021-04-24 NOTE — Patient Outreach (Signed)
[facility]) Care Management  04/24/2021  Pamela [name] [birth year] [id number]  Outreach attempt to patient. No answer and unable to leave voicemail message due to Home Phone: VM states that the phone is no longer in service. Nurse dialed 3 times. Cell Phone: VM states number cannot be completed as dialed. Please check the number and dial again. Nurse dialed 3 times.  Plan: RN Health Coach will call patient within the month of November.  Pamela Loron RN, BSN [place] [phone] [name]

## 2021-04-29 ENCOUNTER — Other Ambulatory Visit: Payer: Self-pay | Admitting: Family Medicine

## 2021-04-29 DIAGNOSIS — Z1231 Encounter for screening mammogram for malignant neoplasm of breast: Secondary | ICD-10-CM

## 2021-04-29 NOTE — Progress Notes (Signed)
[facility] Summary Report / Loop Recorder 

## 2021-05-05 ENCOUNTER — Other Ambulatory Visit: Payer: Self-pay | Admitting: Family Medicine

## 2021-05-05 DIAGNOSIS — E876 Hypokalemia: Secondary | ICD-10-CM

## 2021-05-09 ENCOUNTER — Telehealth: Payer: Self-pay

## 2021-05-09 DIAGNOSIS — E876 Hypokalemia: Secondary | ICD-10-CM

## 2021-05-09 MED ORDER — POTASSIUM CHLORIDE CRYS ER 20 MEQ PO TBCR: EXTENDED_RELEASE_TABLET | ORAL | 0 refills | Status: DC

## 2021-05-09 NOTE — Telephone Encounter (Signed)
Pt asking for refill Potassium chloride  90 day supply (preferred)

## 2021-05-09 NOTE — Telephone Encounter (Signed)
Rx refilled.

## 2021-05-15 LAB — CUP PACEART REMOTE DEVICE CHECK
Date Time Interrogation Session: 20221027033304
Implantable Pulse Generator Implant Date: 20200220

## 2021-05-20 DIAGNOSIS — J449 Chronic obstructive pulmonary disease, unspecified: Secondary | ICD-10-CM | POA: Diagnosis not present

## 2021-05-20 DIAGNOSIS — D631 Anemia in chronic kidney disease: Secondary | ICD-10-CM | POA: Diagnosis not present

## 2021-05-20 DIAGNOSIS — I639 Cerebral infarction, unspecified: Secondary | ICD-10-CM | POA: Diagnosis not present

## 2021-05-20 DIAGNOSIS — Z23 Encounter for immunization: Secondary | ICD-10-CM | POA: Diagnosis not present

## 2021-05-20 DIAGNOSIS — I129 Hypertensive chronic kidney disease with stage 1 through stage 4 chronic kidney disease, or unspecified chronic kidney disease: Secondary | ICD-10-CM | POA: Diagnosis not present

## 2021-05-20 DIAGNOSIS — N2581 Secondary hyperparathyroidism of renal origin: Secondary | ICD-10-CM | POA: Diagnosis not present

## 2021-05-20 DIAGNOSIS — I5033 Acute on chronic diastolic (congestive) heart failure: Secondary | ICD-10-CM | POA: Diagnosis not present

## 2021-05-20 DIAGNOSIS — N184 Chronic kidney disease, stage 4 (severe): Secondary | ICD-10-CM | POA: Diagnosis not present

## 2021-05-20 DIAGNOSIS — D869 Sarcoidosis, unspecified: Secondary | ICD-10-CM | POA: Diagnosis not present

## 2021-05-26 ENCOUNTER — Ambulatory Visit (INDEPENDENT_AMBULATORY_CARE_PROVIDER_SITE_OTHER): Payer: Medicare Other

## 2021-05-26 DIAGNOSIS — I639 Cerebral infarction, unspecified: Secondary | ICD-10-CM | POA: Diagnosis not present

## 2021-05-29 DIAGNOSIS — Z20822 Contact with and (suspected) exposure to covid-19: Secondary | ICD-10-CM | POA: Diagnosis not present

## 2021-06-03 NOTE — Progress Notes (Signed)
[facility] Summary Report / Loop Recorder 

## 2021-06-05 ENCOUNTER — Other Ambulatory Visit: Payer: Self-pay | Admitting: *Deleted

## 2021-06-05 NOTE — Patient Outreach (Signed)
[facility]) Care Management  06/05/2021  Pamela [name] [birth year] [id number]  Outreach attempt to patient. No answer and unable to leave voicemail message due to Home Phone: VM states that the phone is no longer in service. Cell Phone: VM states number cannot be completed as dialed. Please check the number and dial again.  Plan: RN Health Coach will call patient within the month of December.  Emelia Loron RN, BSN [place] [phone] [email or web address]

## 2021-06-06 ENCOUNTER — Ambulatory Visit
Admission: RE | Admit: 2021-06-06 | Discharge: 2021-06-06 | Disposition: A | Discharge status: Home / Self Care | Payer: Medicare Other | Source: Ambulatory Visit | Attending: Family Medicine | Admitting: Family Medicine

## 2021-06-06 DIAGNOSIS — Z1231 Encounter for screening mammogram for malignant neoplasm of breast: Secondary | ICD-10-CM | POA: Diagnosis not present

## 2021-06-27 ENCOUNTER — Other Ambulatory Visit: Payer: Self-pay

## 2021-06-27 ENCOUNTER — Ambulatory Visit (INDEPENDENT_AMBULATORY_CARE_PROVIDER_SITE_OTHER): Payer: Medicare Other | Admitting: Plastic Surgery

## 2021-06-27 VITALS — BP 155/78 | HR 78 | Ht 66.0 in | Wt 355.4 lb

## 2021-06-27 DIAGNOSIS — M546 Pain in thoracic spine: Secondary | ICD-10-CM

## 2021-06-27 DIAGNOSIS — N62 Hypertrophy of breast: Secondary | ICD-10-CM

## 2021-06-27 DIAGNOSIS — G8929 Other chronic pain: Secondary | ICD-10-CM

## 2021-06-27 NOTE — Progress Notes (Signed)
 Referring Provider Hollis, Lachina M, FNP [address] [place],  [place] [address]   CC:  Breast hypertrophy and back pain   Pamela [name] is an 70 y.o. female.  HPI: The patient is a 70-year-old who currently has a 52 DDD breast.  She is interested in being significantly smaller.  She mentions she is like to be as small as possible.  She complains of back and neck and shoulder pain, shoulder grooving, and rashes under her breast.  She uses Vaseline to treat her rashes under her breast.  Her last mammogram was last month but she did not provide the result today.  Allergies  Allergen Reactions   Sulfa Antibiotics Hives and Rash    Outpatient Encounter Medications as of 06/27/2021  Medication Sig Note   acetaminophen (TYLENOL) 500 MG tablet Take 1,000 mg by mouth every 6 (six) hours as needed for mild pain.     albuterol (PROAIR HFA) 108 (90 Base) MCG/ACT inhaler Inhale 2 puffs into the lungs every 6 (six) hours as needed for wheezing or shortness of breath.    albuterol (PROVENTIL) (2.5 MG/3ML) 0.083% nebulizer solution Take 3 mLs (2.5 mg total) by nebulization every 6 (six) hours as needed for wheezing or shortness of breath.    aspirin EC 81 MG tablet Take 1 tablet (81 mg total) by mouth daily. Swallow whole.    atorvastatin (LIPITOR) 40 MG tablet Take 1 tablet (40 mg total) by mouth daily.    calcitRIOL (ROCALTROL) 0.25 MCG capsule Take 1 capsule (0.25 mcg total) by mouth daily.    docusate sodium (COLACE) 100 MG capsule Take 1 capsule (100 mg total) by mouth 2 (two) times daily.    famotidine (PEPCID) 20 MG tablet TAKE 1 TABLET BY MOUTH ONCE DAILY AFTER SUPPER    febuxostat (ULORIC) 40 MG tablet Take 1 tablet (40 mg total) by mouth daily.    furosemide (LASIX) 40 MG tablet Take 1 tablet (40 mg total) by mouth 3 (three) times daily. TAKE 1 TABLET BY MOUTH TWICE DAILY FOR CHF    magnesium oxide (MAG-OX) 400 (241.3 Mg) MG tablet Take 1 tablet (400 mg total) by mouth daily.     montelukast (SINGULAIR) 10 MG tablet TAKE 1 TABLET BY MOUTH AT BEDTIME 04/10/2021: As needed   potassium chloride SA (KLOR-CON) 20 MEQ tablet TAKE 1  BY MOUTH ONCE DAILY    SYMBICORT 160-4.5 MCG/ACT inhaler Inhale 2 puffs into the lungs 2 (two) times daily.    No facility-administered encounter medications on file as of 06/27/2021.     Past Medical History:  Diagnosis Date   Arthritis    Asthma    Cancer (HCC)    lung, adenocarcinoma right lung 2012   CHF (congestive heart failure) (HCC)    Preserved EF   Congenital single kidney    With chronic kidney disease   COPD (chronic obstructive pulmonary disease) (HCC)    Gout    Hypertension    Lung cancer (HCC) 2012   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 Patient notes it was consistent with sarcoidosis but actual pathology results not available.   Oxygen deficiency    Sarcoidosis    Sickle cell trait (HCC)    Sleep apnea     Past Surgical History:  Procedure Laterality   Date   LOOP RECORDER INSERTION N/A 09/08/2018   Procedure: LOOP RECORDER INSERTION;  Surgeon: Taylor, Gregg W, MD;  Location: [facility];  Service: Cardiovascular;  Laterality: N/A;   LUNG BIOPSY     TEE WITHOUT CARDIOVERSION N/A 09/08/2018   Procedure: TRANSESOPHAGEAL ECHOCARDIOGRAM (TEE);  Surgeon: Nishan, Peter C, MD;  Location: [facility] ENDOSCOPY;  Service: Cardiovascular;  Laterality: N/A;  with loop   TUBAL LIGATION     VEIN LIGATION AND STRIPPING      Family History  Problem Relation Age of Onset   Hypertension Mother    Renal Disease Mother    Cancer Father        stomach   Heart disease Father        No details   Cancer Brother    Diabetes Brother    Cervical cancer Sister    Diabetes Sister    Multiple myeloma Sister     Social History   Social History  Narrative   Lives with son.  One son is deceased.       Review of Systems General: Denies fevers, chills, weight loss CV: Denies chest pain, shortness of breath, palpitations   Physical Exam Vitals with BMI 06/27/2021 04/10/2021 01/07/2021  Height 5' 6" 5' 6" 5' 7"  Weight 355 lbs 6 oz 349 lbs 3 oz 339 lbs 1 oz  BMI 57.39 56.39 53.09  Systolic 155 123 107  Diastolic 78 57 59  Pulse 78 63 67    General:  No acute distress,  Alert and oriented, Non-Toxic, Normal speech and affect Breast:  Macromastia, no masses palpable on physical exam SN to nipple 59 cm bilaterally, nipple to fold 29 on the right and 28 on the left.  Base width 22 cm bilaterally   Assessment/Plan Patient would be a candidate for bilateral breast reduction but I gave her a goal weight of below BMI 50 for increased surgical safety and decreased complications.  Time Based Coding:  46 minutes were spent with the patient.  Time was spent reviewing records, discussing surgical procedures with the patient and reviewing risks and benefits.  We discussed surgical approaches to breast reduction and also weight loss.  Daniel Luppens 06/27/2021, 1:20 PM       

## 2021-07-04 ENCOUNTER — Telehealth: Payer: Self-pay

## 2021-07-04 ENCOUNTER — Other Ambulatory Visit: Payer: Self-pay | Admitting: Family Medicine

## 2021-07-04 DIAGNOSIS — E876 Hypokalemia: Secondary | ICD-10-CM

## 2021-07-04 MED ORDER — POTASSIUM CHLORIDE CRYS ER 20 MEQ PO TBCR: EXTENDED_RELEASE_TABLET | ORAL | 1 refills | Status: DC

## 2021-07-04 NOTE — Telephone Encounter (Signed)
Potassium

## 2021-07-04 NOTE — Telephone Encounter (Signed)
Rx refilled.

## 2021-07-04 NOTE — Progress Notes (Signed)
Meds ordered this encounter  Medications   potassium chloride SA (KLOR-CON M) 20 MEQ tablet    Sig: TAKE 1  BY MOUTH ONCE DAILY    Dispense:  90 tablet    Refill:  1    Order Specific Question:   Supervising Provider    Answer:   Tresa Garter [[id number]]     [name]  APRN, MSN, FNP-C Patient Pamela [name] [address] [place], [place] [address] [phone]

## 2021-07-10 ENCOUNTER — Other Ambulatory Visit: Payer: Self-pay | Admitting: *Deleted

## 2021-07-10 NOTE — Patient Outreach (Signed)
[facility]) Care Management  07/10/2021  Pamela [name] 04/08/1951 [id number]  Outreach attempt to patient. No answer and unable to leave voicemail message due to Home Phone: VM states that the phone is no longer in service. Cell Phone: VM states number cannot be completed as dialed. Please check the number and dial again.   Plan: RN Health Coach will call patient within the month of January.   Pamela Loron RN, BSN [place] [phone] [email or web address]

## 2021-07-15 ENCOUNTER — Encounter: Payer: Self-pay | Admitting: Family Medicine

## 2021-07-15 ENCOUNTER — Other Ambulatory Visit: Payer: Self-pay

## 2021-07-15 ENCOUNTER — Ambulatory Visit (INDEPENDENT_AMBULATORY_CARE_PROVIDER_SITE_OTHER): Payer: Medicare Other | Admitting: Family Medicine

## 2021-07-15 VITALS — BP 125/50 | HR 67 | Temp 97.2°F | Ht 66.0 in | Wt 361.2 lb

## 2021-07-15 DIAGNOSIS — R82998 Other abnormal findings in urine: Secondary | ICD-10-CM | POA: Diagnosis not present

## 2021-07-15 DIAGNOSIS — Z6841 Body Mass Index (BMI) 40.0 and over, adult: Secondary | ICD-10-CM

## 2021-07-15 DIAGNOSIS — N183 Chronic kidney disease, stage 3 unspecified: Secondary | ICD-10-CM | POA: Diagnosis not present

## 2021-07-15 DIAGNOSIS — J4530 Mild persistent asthma, uncomplicated: Secondary | ICD-10-CM

## 2021-07-15 DIAGNOSIS — I10 Essential (primary) hypertension: Secondary | ICD-10-CM | POA: Diagnosis not present

## 2021-07-15 DIAGNOSIS — Z8709 Personal history of other diseases of the respiratory system: Secondary | ICD-10-CM

## 2021-07-15 DIAGNOSIS — R7303 Prediabetes: Secondary | ICD-10-CM

## 2021-07-15 DIAGNOSIS — E66813 Obesity, class 3: Secondary | ICD-10-CM

## 2021-07-15 DIAGNOSIS — I639 Cerebral infarction, unspecified: Secondary | ICD-10-CM

## 2021-07-15 LAB — POCT URINALYSIS DIP (CLINITEK)
Bilirubin, UA: NEGATIVE
Blood, UA: NEGATIVE
Glucose, UA: NEGATIVE mg/dL
Ketones, POC UA: NEGATIVE mg/dL
Nitrite, UA: NEGATIVE
POC PROTEIN,UA: NEGATIVE
Spec Grav, UA: 1.015 (ref 1.010–1.025)
Urobilinogen, UA: 0.2 E.U./dL
pH, UA: 5 (ref 5.0–8.0)

## 2021-07-15 LAB — POCT GLYCOSYLATED HEMOGLOBIN (HGB A1C)
HbA1c POC (<> result, manual entry): 5.8 % (ref 4.0–5.6)
HbA1c, POC (controlled diabetic range): 5.8 % (ref 0.0–7.0)
HbA1c, POC (prediabetic range): 5.8 % (ref 5.7–6.4)
Hemoglobin A1C: 5.8 % — AB (ref 4.0–5.6)

## 2021-07-15 MED ORDER — SYMBICORT 160-4.5 MCG/ACT IN AERO: 2.0000 | INHALATION_SPRAY | Freq: Two times a day (BID) | RESPIRATORY_TRACT | 6 refills | Status: DC

## 2021-07-15 NOTE — Progress Notes (Signed)
Patient [facility]   Established Patient Office Visit  Subjective:  Patient ID: Pamela [name], female    DOB: 05-Feb-1951  Age: 70 y.o. MRN: [id number]  CC:  Chief Complaint  Patient presents with   Follow-up    Pt is here today for her 3 month follow up.  No concerns or issues    HPI Pamela [name] is a very pleasant 70 year old female with a medical history significant for essential hypertension, stage III chronic kidney disease, chronic diastolic CHF, chronic respiratory failure with hypoxia, history of COPD, history of stroke, history of lung cancer, and history of anemia of chronic disease presents for follow-up of chronic conditions.  Patient states that she has been doing very well and is without complaint on today.  She is taking all prescribed medications consistently.  Pamela [name] has a history of morbid obesity.  She is not following a low-fat, low carbohydrate diet.  Also, patient is mostly sedentary.  She recently had an evaluation for breast reduction and was asked to reduce weight by 15%.  Patient has not started diet as of yet. Patient has a history of hypertension.  She does not check blood pressure at home.  She has been taking all prescribed medications consistently.  She denies any headache, chest pain, urinary symptoms, nausea, vomiting, or diarrhea.  Past Medical History:  Diagnosis Date   Arthritis    Asthma    Cancer ([place])    lung, adenocarcinoma right lung 2012   CHF (congestive heart failure) (H[facility])    Preserved EF   Congenital single kidney    With chronic kidney disease   COPD (chronic obstructive pulmonary disease) ([place])    Gout    Hypertension    Lung cancer ([place]) 2012   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 Patient notes it was  consistent with sarcoidosis but actual pathology results not available.   Oxygen deficiency    Sarcoidosis    Sickle cell trait ([place])    Sleep apnea     Past Surgical History:  Procedure Laterality Date   LOOP RECORDER INSERTION N/A 09/08/2018   Procedure: LOOP RECORDER INSERTION;  Surgeon: Evans Lance, MD;  Location: [facility];  Service: Cardiovascular;  Laterality: N/A;   LUNG BIOPSY     TEE WITHOUT CARDIOVERSION N/A 09/08/2018   Procedure: TRANSESOPHAGEAL ECHOCARDIOGRAM (TEE);  Surgeon: Josue Hector, MD;  Location: [facility];  Service: Cardiovascular;  Laterality: N/A;  with loop   TUBAL LIGATION     VEIN LIGATION AND STRIPPING      Family History  Problem Relation Age of Onset   Hypertension Mother    Renal Disease Mother    Cancer Father        stomach   Heart disease Father        No details   Cancer Brother    Diabetes Brother    Cervical cancer Sister    Diabetes Sister    Multiple myeloma Sister     Social History   Socioeconomic History   Marital status: Divorced    Spouse name: Not on file   Number of children: 2   Years of education: Not on file   Highest education level: Not on file  Occupational History   Not  on file  Tobacco Use   Smoking status: Never   Smokeless tobacco: Never  Vaping Use   Vaping Use: Never used  Substance and Sexual Activity   Alcohol use: No    Alcohol/week: 0.0 standard drinks   Drug use: No   Sexual activity: Not Currently  Other Topics Concern   Not on file  Social History Narrative   Lives with son.  One son is deceased.     Social Determinants of Health   Financial Resource Strain: Not on file  Food Insecurity: Food Insecurity Present   Worried About [place] in the Last Year: Sometimes true   Ran Out of Food in the Last Year: Sometimes true  Transportation Needs: No Transportation Needs   Lack of Transportation (Medical): No   Lack of Transportation (Non-Medical): No  Physical  Activity: Not on file  Stress: Not on file  Social Connections: Not on file  Intimate Partner Violence: Not on file    Outpatient Medications Prior to Visit  Medication Sig Dispense Refill   acetaminophen (TYLENOL) 500 MG tablet Take 1,000 mg by mouth every 6 (six) hours as needed for mild pain.      albuterol (PROAIR HFA) 108 (90 Base) MCG/ACT inhaler Inhale 2 puffs into the lungs every 6 (six) hours as needed for wheezing or shortness of breath. 8 g 3   albuterol (PROVENTIL) (2.5 MG/3ML) 0.083% nebulizer solution Take 3 mLs (2.5 mg total) by nebulization every 6 (six) hours as needed for wheezing or shortness of breath. 100 mL 2   aspirin EC 81 MG tablet Take 1 tablet (81 mg total) by mouth daily. Swallow whole. 30 tablet 11   atorvastatin (LIPITOR) 40 MG tablet Take 1 tablet (40 mg total) by mouth daily. 90 tablet 4   calcitRIOL (ROCALTROL) 0.25 MCG capsule Take 1 capsule (0.25 mcg total) by mouth daily. 90 capsule 2   docusate sodium (COLACE) 100 MG capsule Take 1 capsule (100 mg total) by mouth 2 (two) times daily. 60 capsule 0   famotidine (PEPCID) 20 MG tablet TAKE 1 TABLET BY MOUTH ONCE DAILY AFTER SUPPER 30 tablet 6   febuxostat (ULORIC) 40 MG tablet Take 1 tablet (40 mg total) by mouth daily. 90 tablet 1   furosemide (LASIX) 40 MG tablet Take 1 tablet (40 mg total) by mouth 3 (three) times daily. TAKE 1 TABLET BY MOUTH TWICE DAILY FOR CHF 180 tablet 1   magnesium oxide (MAG-OX) 400 (241.3 Mg) MG tablet Take 1 tablet (400 mg total) by mouth daily. 30 tablet 0   montelukast (SINGULAIR) 10 MG tablet TAKE 1 TABLET BY MOUTH AT BEDTIME 90 tablet 0   potassium chloride SA (KLOR-CON M) 20 MEQ tablet TAKE 1  BY MOUTH ONCE DAILY 90 tablet 1   SYMBICORT 160-4.5 MCG/ACT inhaler Inhale 2 puffs into the lungs 2 (two) times daily. 11 g 6   No facility-administered medications prior to visit.    Allergies  Allergen Reactions   Sulfa Antibiotics Hives and Rash    ROS Review of Systems   Constitutional: Negative.   HENT: Negative.    Respiratory: Negative.    Gastrointestinal: Negative.   Endocrine: Negative.   Genitourinary: Negative.   Musculoskeletal: Negative.   Neurological: Negative.   Psychiatric/Behavioral: Negative.       Objective:    Physical Exam Constitutional:      Appearance: She is obese.  Eyes:     Pupils: Pupils are equal, round, and reactive to  light.  Cardiovascular:     Rate and Rhythm: Normal rate and regular rhythm.     Pulses: Normal pulses.  Pulmonary:     Effort: Pulmonary effort is normal.  Abdominal:     General: Bowel sounds are normal.  Musculoskeletal:        General: Normal range of motion.  Skin:    General: Skin is warm.  Neurological:     General: No focal deficit present.     Mental Status: Mental status is at baseline.  Psychiatric:        Mood and Affect: Mood normal.        Thought Content: Thought content normal.        Judgment: Judgment normal.    BP (!) 125/50    Pulse 67    Temp (!) 97.2 F (36.2 C)    Ht '5\' 6"'  (1.676 m)    Wt (!) 361 lb 3.2 oz (163.8 kg)    SpO2 100%    BMI 58.30 kg/m  Wt Readings from Last 3 Encounters:  07/15/21 (!) 361 lb 3.2 oz (163.8 kg)  06/27/21 (!) 355 lb 6.4 oz (161.2 kg)  04/10/21 (!) 349 lb 3.2 oz (158.4 kg)    Health Maintenance Due  Topic Date Due   Zoster Vaccines- Shingrix (1 of 2) Never done   COVID-19 Vaccine (5 - Booster for Pfizer series) 05/01/2021    There are no preventive care reminders to display for this patient.  Lab Results  Component Value Date   TSH 3.934 05/21/2014   Lab Results  Component Value Date   WBC 4.7 12/13/2020   HGB 11.3 (L) 12/13/2020   HCT 36.2 12/13/2020   MCV 71.5 (L) 12/13/2020   PLT 157 12/13/2020   Lab Results  Component Value Date   NA 142 04/10/2021   K 4.1 04/10/2021   CHLORIDE 103 05/31/2017   CO2 24 04/10/2021   GLUCOSE 84 04/10/2021   BUN 46 (H) 04/10/2021   CREATININE 2.08 (H) 04/10/2021   BILITOT 0.4  12/13/2020   ALKPHOS 171 (H) 12/13/2020   AST 26 12/13/2020   ALT 14 12/13/2020   PROT 6.9 12/13/2020   ALBUMIN 3.4 (L) 12/13/2020   CALCIUM 9.1 04/10/2021   ANIONGAP 13 12/13/2020   EGFR 25 (L) 04/10/2021   Lab Results  Component Value Date   CHOL 130 03/01/2019   Lab Results  Component Value Date   HDL 48 03/01/2019   Lab Results  Component Value Date   LDLCALC 61 03/01/2019   Lab Results  Component Value Date   TRIG 105 03/01/2019   Lab Results  Component Value Date   CHOLHDL 2.7 03/01/2019   Lab Results  Component Value Date   HGBA1C 5.8 (A) 07/15/2021   HGBA1C 5.8 07/15/2021   HGBA1C 5.8 07/15/2021   HGBA1C 5.8 07/15/2021      Assessment & Plan:   Problem List Items Addressed This Visit       Respiratory   Asthma, chronic   Relevant Medications   SYMBICORT 160-4.5 MCG/ACT inhaler     Other   Prediabetes - Primary   Relevant Orders   HgB A1c (Completed)    Meds ordered this encounter  Medications   SYMBICORT 160-4.5 MCG/ACT inhaler    Sig: Inhale 2 puffs into the lungs 2 (two) times daily.    Dispense:  11 g    Refill:  6   1. Prediabetes Hemoglobin A1c has increased to 5.8.  Discussed at  length.  Patient to follow carbohydrate modified diet. - HgB A1c - Comprehensive metabolic panel - POCT URINALYSIS DIP (CLINITEK)  2. Mild persistent chronic asthma without complication  - SYMBICORT 160-4.5 MCG/ACT inhaler; Inhale 2 puffs into the lungs 2 (two) times daily.  Dispense: 11 g; Refill: 6  3. Stage 3 chronic kidney disease, unspecified whether stage 3a or 3b CKD ([place]) Continue to follow-up with nephrology - Comprehensive metabolic panel  4. Class 3 severe obesity due to excess calories with serious comorbidity and body mass index (BMI) of 50.0 to 59.9 in adult [facility]) The patient is asked to make an attempt to improve diet and exercise patterns to aid in medical management of this problem.   5. History of COPD Continue to follow-up with  pulmonology as scheduled  6. Essential hypertension BP (!) 125/50    Pulse 67    Temp (!) 97.2 F (36.2 C)    Ht '5\' 6"'  (1.676 m)    Wt (!) 361 lb 3.2 oz (163.8 kg)    SpO2 100%    BMI 58.30 kg/m  - Continue medication, monitor blood pressure at home. Continue DASH diet.  Reminder to go to the ER if any CP, SOB, nausea, dizziness, severe HA, changes vision/speech, left arm numbness and tingling and jaw pain.   - POCT URINALYSIS DIP (CLINITEK)  7. Leukocytes in urine  - Urine Culture  Follow-up: Return in about 3 months (around 10/13/2021).    [name]  APRN, MSN, FNP-C [facility] [address] [place], [place] [address] [phone]

## 2021-07-15 NOTE — Patient Instructions (Signed)

## 2021-07-16 LAB — COMPREHENSIVE METABOLIC PANEL
ALT: 32 IU/L (ref 0–32)
AST: 40 IU/L (ref 0–40)
Albumin/Globulin Ratio: 1.4 (ref 1.2–2.2)
Albumin: 3.9 g/dL (ref 3.8–4.8)
Alkaline Phosphatase: 206 IU/L — ABNORMAL HIGH (ref 44–121)
BUN/Creatinine Ratio: 22 (ref 12–28)
BUN: 40 mg/dL — ABNORMAL HIGH (ref 8–27)
Bilirubin Total: 0.3 mg/dL (ref 0.0–1.2)
CO2: 26 mmol/L (ref 20–29)
Calcium: 8.6 mg/dL — ABNORMAL LOW (ref 8.7–10.3)
Chloride: 99 mmol/L (ref 96–106)
Creatinine, Ser: 1.83 mg/dL — ABNORMAL HIGH (ref 0.57–1.00)
Globulin, Total: 2.7 g/dL (ref 1.5–4.5)
Glucose: 83 mg/dL (ref 70–99)
Potassium: 3.9 mmol/L (ref 3.5–5.2)
Sodium: 143 mmol/L (ref 134–144)
Total Protein: 6.6 g/dL (ref 6.0–8.5)
eGFR: 30 mL/min/{1.73_m2} — ABNORMAL LOW (ref >59)

## 2021-07-17 LAB — URINE CULTURE

## 2021-07-23 ENCOUNTER — Ambulatory Visit: Payer: Medicare Other | Admitting: Podiatry

## 2021-07-24 ENCOUNTER — Ambulatory Visit (INDEPENDENT_AMBULATORY_CARE_PROVIDER_SITE_OTHER): Payer: Medicare Other

## 2021-07-24 ENCOUNTER — Other Ambulatory Visit: Payer: Self-pay

## 2021-07-24 ENCOUNTER — Ambulatory Visit (INDEPENDENT_AMBULATORY_CARE_PROVIDER_SITE_OTHER): Payer: Medicare Other | Admitting: Podiatry

## 2021-07-24 DIAGNOSIS — M79675 Pain in left toe(s): Secondary | ICD-10-CM | POA: Diagnosis not present

## 2021-07-24 DIAGNOSIS — M79674 Pain in right toe(s): Secondary | ICD-10-CM

## 2021-07-24 DIAGNOSIS — B351 Tinea unguium: Secondary | ICD-10-CM

## 2021-07-24 DIAGNOSIS — M7752 Other enthesopathy of left foot: Secondary | ICD-10-CM | POA: Diagnosis not present

## 2021-07-24 DIAGNOSIS — M25572 Pain in left ankle and joints of left foot: Secondary | ICD-10-CM

## 2021-07-27 NOTE — Progress Notes (Signed)
Subjective: 71 y.o. returns the office today for painful, elongated, thickened toenails which she cannot trim herself and for a callus on the right heel.  Also she has been having some left ankle pain over the last 2 months on the lateral aspect.  No recent injury or trauma.  No increase in swelling but she does have chronic edema.  No recent treatment.  She has no new concerns today.  Objective: AAO 3, NAD DP/PT pulses palpable, CRT less than 3 seconds Chronic lower extremity edema. Nails hypertrophic, dystrophic, elongated, brittle, discolored 10. There is tenderness overlying the nails 1-5 bilaterally. There is no surrounding erythema or drainage along the nail sites. Slight hyperkeratotic tissue present bilateral plantar heel without any underlying ulceration, drainage, or signs of infection.  There are no open lesions bilaterally. Tenderness to palpation seen along the lateral aspect of the left ankle.  Normal distal portion of fibula normal course the lateral ankle complex.  No pain with ankle or subtalar joint range of motion.  No gross ankle instability is noted. No pain with calf compression, swelling, warmth, erythema.  Assessment: Patient presents with symptomatic onychomycosis, hyperkeratotic lesion; left ankle pain, capsulitis  Plan: -Treatment options including alternatives, risks, complications were discussed -X-rays obtained reviewed left ankle.  No evidence of acute fracture.  Ankle joint maintained.  Arthritic changes present in the foot. -Regards to ankle pain we discussed range of motion exercises as well as shoes and good arch support.  Discussed steroid injection but she is to hold off on this today and if symptoms continue she will consider this. -Nails sharply debrided 10 without complication/bleeding. -Sharply debrided the minimal hyperkeratotic lesion x 2 without any complications or bleeding as a courtesy.  -Discussed daily foot inspection. If there are any  changes, to call the office immediately.  -Follow-up in 3 months or sooner if any problems are to arise. In the meantime, encouraged to call the office with any questions, concerns, changes symptoms.  Trula Slade DPM

## 2021-08-01 ENCOUNTER — Other Ambulatory Visit: Payer: Self-pay | Admitting: Family Medicine

## 2021-08-01 DIAGNOSIS — I639 Cerebral infarction, unspecified: Secondary | ICD-10-CM

## 2021-08-01 DIAGNOSIS — I5030 Unspecified diastolic (congestive) heart failure: Secondary | ICD-10-CM

## 2021-08-04 ENCOUNTER — Ambulatory Visit (INDEPENDENT_AMBULATORY_CARE_PROVIDER_SITE_OTHER): Payer: Medicare Other

## 2021-08-04 DIAGNOSIS — I639 Cerebral infarction, unspecified: Secondary | ICD-10-CM | POA: Diagnosis not present

## 2021-08-04 LAB — CUP PACEART REMOTE DEVICE CHECK
Date Time Interrogation Session: 20230115234050
Implantable Pulse Generator Implant Date: 20200220

## 2021-08-05 ENCOUNTER — Other Ambulatory Visit: Payer: Self-pay | Admitting: Family Medicine

## 2021-08-05 DIAGNOSIS — D869 Sarcoidosis, unspecified: Secondary | ICD-10-CM

## 2021-08-06 ENCOUNTER — Other Ambulatory Visit: Payer: Self-pay | Admitting: Family Medicine

## 2021-08-06 DIAGNOSIS — D869 Sarcoidosis, unspecified: Secondary | ICD-10-CM

## 2021-08-07 ENCOUNTER — Telehealth: Payer: Self-pay

## 2021-08-07 NOTE — Telephone Encounter (Signed)
Albuterol

## 2021-08-08 NOTE — Telephone Encounter (Signed)
Done 08/06/2021

## 2021-08-15 ENCOUNTER — Other Ambulatory Visit: Payer: Self-pay | Admitting: *Deleted

## 2021-08-15 ENCOUNTER — Other Ambulatory Visit: Payer: Self-pay | Admitting: Family Medicine

## 2021-08-15 DIAGNOSIS — M109 Gout, unspecified: Secondary | ICD-10-CM

## 2021-08-15 NOTE — Patient Outreach (Signed)
[facility]) Care Management  08/15/2021  Larken Urias 1950-09-27 [id number]  Outreach attempt to patient. No answer and unable to leave voicemail message due to Home Phone: VM states that the phone is no longer in service. Cell Phone: VM states number cannot be completed as dialed. Please check the number and dial again.   Plan: RN Health Coach will call patient within the month of February.   Emelia Loron RN, BSN [place] [phone] [email or web address]

## 2021-08-15 NOTE — Progress Notes (Signed)
[facility] Summary Report / Loop Recorder 

## 2021-08-19 ENCOUNTER — Other Ambulatory Visit: Payer: Self-pay | Admitting: Podiatry

## 2021-08-19 DIAGNOSIS — M7752 Other enthesopathy of left foot: Secondary | ICD-10-CM

## 2021-08-23 ENCOUNTER — Other Ambulatory Visit: Payer: Self-pay | Admitting: Family Medicine

## 2021-08-23 DIAGNOSIS — M109 Gout, unspecified: Secondary | ICD-10-CM

## 2021-08-26 ENCOUNTER — Telehealth: Payer: Self-pay

## 2021-08-26 DIAGNOSIS — J449 Chronic obstructive pulmonary disease, unspecified: Secondary | ICD-10-CM | POA: Diagnosis not present

## 2021-08-26 DIAGNOSIS — I639 Cerebral infarction, unspecified: Secondary | ICD-10-CM | POA: Diagnosis not present

## 2021-08-26 DIAGNOSIS — E8881 Metabolic syndrome: Secondary | ICD-10-CM | POA: Diagnosis not present

## 2021-08-26 DIAGNOSIS — D631 Anemia in chronic kidney disease: Secondary | ICD-10-CM | POA: Diagnosis not present

## 2021-08-26 DIAGNOSIS — N184 Chronic kidney disease, stage 4 (severe): Secondary | ICD-10-CM | POA: Diagnosis not present

## 2021-08-26 DIAGNOSIS — N2581 Secondary hyperparathyroidism of renal origin: Secondary | ICD-10-CM | POA: Diagnosis not present

## 2021-08-26 DIAGNOSIS — N179 Acute kidney failure, unspecified: Secondary | ICD-10-CM | POA: Diagnosis not present

## 2021-08-26 DIAGNOSIS — D869 Sarcoidosis, unspecified: Secondary | ICD-10-CM | POA: Diagnosis not present

## 2021-08-26 DIAGNOSIS — N189 Chronic kidney disease, unspecified: Secondary | ICD-10-CM | POA: Diagnosis not present

## 2021-08-26 DIAGNOSIS — I129 Hypertensive chronic kidney disease with stage 1 through stage 4 chronic kidney disease, or unspecified chronic kidney disease: Secondary | ICD-10-CM | POA: Diagnosis not present

## 2021-08-26 DIAGNOSIS — I5033 Acute on chronic diastolic (congestive) heart failure: Secondary | ICD-10-CM | POA: Diagnosis not present

## 2021-08-26 DIAGNOSIS — M109 Gout, unspecified: Secondary | ICD-10-CM | POA: Diagnosis not present

## 2021-08-26 NOTE — Telephone Encounter (Signed)
Pt called asking for GOUT med [place]) 40mg 

## 2021-08-27 ENCOUNTER — Other Ambulatory Visit: Payer: Self-pay | Admitting: Family Medicine

## 2021-08-27 DIAGNOSIS — M109 Gout, unspecified: Secondary | ICD-10-CM

## 2021-08-27 MED ORDER — FEBUXOSTAT 40 MG PO TABS: 40.0000 mg | ORAL_TABLET | Freq: Every day | ORAL | 1 refills | Status: DC

## 2021-08-27 NOTE — Progress Notes (Signed)
Meds ordered this encounter  Medications   febuxostat (ULORIC) 40 MG tablet    Sig: Take 1 tablet (40 mg total) by mouth daily.    Dispense:  90 tablet    Refill:  1    Order Specific Question:   Supervising Provider    Answer:   Tresa Garter [[id number]]   [name]  APRN, MSN, FNP-C [facility] [address] [place], [place] [address] [phone]

## 2021-09-01 NOTE — Telephone Encounter (Signed)
Further information from Nephrology.  No changes in meds, SGLT@I  if GFR over 30

## 2021-09-05 ENCOUNTER — Ambulatory Visit (INDEPENDENT_AMBULATORY_CARE_PROVIDER_SITE_OTHER): Payer: Medicare Other

## 2021-09-05 DIAGNOSIS — I639 Cerebral infarction, unspecified: Secondary | ICD-10-CM | POA: Diagnosis not present

## 2021-09-06 DIAGNOSIS — Z20822 Contact with and (suspected) exposure to covid-19: Secondary | ICD-10-CM | POA: Diagnosis not present

## 2021-09-07 LAB — CUP PACEART REMOTE DEVICE CHECK
Date Time Interrogation Session: 20230217234304
Implantable Pulse Generator Implant Date: 20200220

## 2021-09-11 ENCOUNTER — Ambulatory Visit (INDEPENDENT_AMBULATORY_CARE_PROVIDER_SITE_OTHER): Payer: Medicare Other | Admitting: Family Medicine

## 2021-09-11 ENCOUNTER — Other Ambulatory Visit: Payer: Self-pay

## 2021-09-11 ENCOUNTER — Encounter: Payer: Self-pay | Admitting: Family Medicine

## 2021-09-11 VITALS — BP 131/52 | HR 65 | Temp 97.2°F | Ht 66.0 in | Wt 354.2 lb

## 2021-09-11 DIAGNOSIS — D869 Sarcoidosis, unspecified: Secondary | ICD-10-CM

## 2021-09-11 DIAGNOSIS — J4530 Mild persistent asthma, uncomplicated: Secondary | ICD-10-CM | POA: Diagnosis not present

## 2021-09-11 DIAGNOSIS — N183 Chronic kidney disease, stage 3 unspecified: Secondary | ICD-10-CM | POA: Diagnosis not present

## 2021-09-11 DIAGNOSIS — R7303 Prediabetes: Secondary | ICD-10-CM

## 2021-09-11 DIAGNOSIS — I639 Cerebral infarction, unspecified: Secondary | ICD-10-CM

## 2021-09-11 DIAGNOSIS — I10 Essential (primary) hypertension: Secondary | ICD-10-CM

## 2021-09-11 DIAGNOSIS — Z6841 Body Mass Index (BMI) 40.0 and over, adult: Secondary | ICD-10-CM | POA: Diagnosis not present

## 2021-09-11 DIAGNOSIS — L0231 Cutaneous abscess of buttock: Secondary | ICD-10-CM

## 2021-09-11 LAB — POCT URINALYSIS DIP (CLINITEK)
Bilirubin, UA: NEGATIVE
Blood, UA: NEGATIVE
Glucose, UA: NEGATIVE mg/dL
Ketones, POC UA: NEGATIVE mg/dL
Leukocytes, UA: NEGATIVE
Nitrite, UA: NEGATIVE
POC PROTEIN,UA: NEGATIVE
Spec Grav, UA: 1.01 (ref 1.010–1.025)
Urobilinogen, UA: 0.2 E.U./dL
pH, UA: 6 (ref 5.0–8.0)

## 2021-09-11 LAB — POCT GLYCOSYLATED HEMOGLOBIN (HGB A1C)
HbA1c POC (<> result, manual entry): 5.8 % (ref 4.0–5.6)
HbA1c, POC (controlled diabetic range): 5.8 % (ref 0.0–7.0)
HbA1c, POC (prediabetic range): 5.8 % (ref 5.7–6.4)
Hemoglobin A1C: 5.8 % — AB (ref 4.0–5.6)

## 2021-09-11 MED ORDER — FLUTICASONE-SALMETEROL 55-14 MCG/ACT IN AEPB: 2.0000 | INHALATION_SPRAY | Freq: Two times a day (BID) | RESPIRATORY_TRACT | 12 refills | Status: DC

## 2021-09-11 MED ORDER — SEMAGLUTIDE(0.25 OR 0.5MG/DOS) 2 MG/1.5ML ~~LOC~~ SOPN: 0.2500 mg | PEN_INJECTOR | SUBCUTANEOUS | 11 refills | Status: DC

## 2021-09-11 MED ORDER — DOXYCYCLINE HYCLATE 100 MG PO TABS: 100.0000 mg | ORAL_TABLET | Freq: Two times a day (BID) | ORAL | 0 refills | Status: DC

## 2021-09-11 MED ORDER — ALBUTEROL SULFATE (2.5 MG/3ML) 0.083% IN NEBU: INHALATION_SOLUTION | RESPIRATORY_TRACT | 2 refills | Status: DC

## 2021-09-11 NOTE — Patient Instructions (Signed)
Semaglutide Injection What is this medication? SEMAGLUTIDE (SEM a GLOO tide) treats type 2 diabetes. It works by increasing insulin levels in your body, which decreases your blood sugar (glucose). It also reduces the amount of sugar released into the blood and slows down your digestion. It can also be used to lower the risk of heart attack and stroke in people with type 2 diabetes. Changes to diet and exercise are often combined with this medication. This medicine may be used for other purposes; ask your health care provider or pharmacist if you have questions. COMMON BRAND NAME(S): OZEMPIC What should I tell my care team before I take this medication? They need to know if you have any of these conditions: Endocrine tumors (MEN 2) or if someone in your family had these tumors Eye disease, vision problems History of pancreatitis Kidney disease Stomach problems Thyroid cancer or if someone in your family had thyroid cancer An unusual or allergic reaction to semaglutide, other medications, foods, dyes, or preservatives Pregnant or trying to get pregnant Breast-feeding How should I use this medication? This medication is for injection under the skin of your upper leg (thigh), stomach area, or upper arm. It is given once every week (every 7 days). You will be taught how to prepare and give this medication. Use exactly as directed. Take your medication at regular intervals. Do not take it more often than directed. If you use this medication with insulin, you should inject this medication and the insulin separately. Do not mix them together. Do not give the injections right next to each other. Change (rotate) injection sites with each injection. It is important that you put your used needles and syringes in a special sharps container. Do not put them in a trash can. If you do not have a sharps container, call your pharmacist or care team to get one. A special MedGuide will be given to you by the  pharmacist with each prescription and refill. Be sure to read this information carefully each time. This medication comes with INSTRUCTIONS FOR USE. Ask your pharmacist for directions on how to use this medication. Read the information carefully. Talk to your pharmacist or care team if you have questions. Talk to your care team about the use of this medication in children. Special care may be needed. Overdosage: If you think you have taken too much of this medicine contact a poison control center or emergency room at once. NOTE: This medicine is only for you. Do not share this medicine with others. What if I miss a dose? If you miss a dose, take it as soon as you can within 5 days after the missed dose. Then take your next dose at your regular weekly time. If it has been longer than 5 days after the missed dose, do not take the missed dose. Take the next dose at your regular time. Do not take double or extra doses. If you have questions about a missed dose, contact your care team for advice. What may interact with this medication? Other medications for diabetes Many medications may cause changes in blood sugar, these include: Alcohol containing beverages Antiviral medications for HIV or AIDS Aspirin and aspirin-like medications Certain medications for blood pressure, heart disease, irregular heart beat Chromium Diuretics Female hormones, such as estrogens or progestins, birth control pills Fenofibrate Gemfibrozil Isoniazid Lanreotide Female hormones or anabolic steroids MAOIs like Carbex, Eldepryl, Marplan, Nardil, and Parnate Medications for weight loss Medications for allergies, asthma, cold, or cough Medications for depression,  anxiety, or psychotic disturbances Niacin Nicotine NSAIDs, medications for pain and inflammation, like ibuprofen or naproxen Octreotide Pasireotide Pentamidine Phenytoin Probenecid Quinolone antibiotics such as ciprofloxacin, levofloxacin, ofloxacin Some  herbal dietary supplements Steroid medications such as prednisone or cortisone Sulfamethoxazole; trimethoprim Thyroid hormones Some medications can hide the warning symptoms of low blood sugar (hypoglycemia). You may need to monitor your blood sugar more closely if you are taking one of these medications. These include: Beta-blockers, often used for high blood pressure or heart problems (examples include atenolol, metoprolol, propranolol) Clonidine Guanethidine Reserpine This list may not describe all possible interactions. Give your health care provider a list of all the medicines, herbs, non-prescription drugs, or dietary supplements you use. Also tell them if you smoke, drink alcohol, or use illegal drugs. Some items may interact with your medicine. What should I watch for while using this medication? Visit your care team for regular checks on your progress. Drink plenty of fluids while taking this medication. Check with your care team if you get an attack of severe diarrhea, nausea, and vomiting. The loss of too much body fluid can make it dangerous for you to take this medication. A test called the HbA1C (A1C) will be monitored. This is a simple blood test. It measures your blood sugar control over the last 2 to 3 months. You will receive this test every 3 to 6 months. Learn how to check your blood sugar. Learn the symptoms of low and high blood sugar and how to manage them. Always carry a quick-source of sugar with you in case you have symptoms of low blood sugar. Examples include hard sugar candy or glucose tablets. Make sure others know that you can choke if you eat or drink when you develop serious symptoms of low blood sugar, such as seizures or unconsciousness. They must get medical help at once. Tell your care team if you have high blood sugar. You might need to change the dose of your medication. If you are sick or exercising more than usual, you might need to change the dose of your  medication. Do not skip meals. Ask your care team if you should avoid alcohol. Many nonprescription cough and cold products contain sugar or alcohol. These can affect blood sugar. Pens should never be shared. Even if the needle is changed, sharing may result in passing of viruses like hepatitis or HIV. Wear a medical ID bracelet or chain, and carry a card that describes your disease and details of your medication and dosage times. Do not become pregnant while taking this medication. Women should inform their care team if they wish to become pregnant or think they might be pregnant. There is a potential for serious side effects to an unborn child. Talk to your care team for more information. What side effects may I notice from receiving this medication? Side effects that you should report to your care team as soon as possible: Allergic reactions--skin rash, itching, hives, swelling of the face, lips, tongue, or throat Change in vision Dehydration--increased thirst, dry mouth, feeling faint or lightheaded, headache, dark yellow or brown urine Gallbladder problems--severe stomach pain, nausea, vomiting, fever Heart palpitations--rapid, pounding, or irregular heartbeat Kidney injury--decrease in the amount of urine, swelling of the ankles, hands, or feet Pancreatitis--severe stomach pain that spreads to your back or gets worse after eating or when touched, fever, nausea, vomiting Thyroid cancer--new mass or lump in the neck, pain or trouble swallowing, trouble breathing, hoarseness Side effects that usually do not require medical  attention (report to your care team if they continue or are bothersome): Diarrhea Loss of appetite Nausea Stomach pain Vomiting This list may not describe all possible side effects. Call your doctor for medical advice about side effects. You may report side effects to FDA at 1-800-FDA-1088. Where should I keep my medication? Keep out of the reach of children. Store  unopened pens in a refrigerator between 2 and 8 degrees C (36 and 46 degrees F). Do not freeze. Protect from light and heat. After you first use the pen, it can be stored for 56 days at room temperature between 15 and 30 degrees C (59 and 86 degrees F) or in a refrigerator. Throw away your used pen after 56 days or after the expiration date, whichever comes first. Do not store your pen with the needle attached. If the needle is left on, medication may leak from the pen. NOTE: This sheet is a summary. It may not cover all possible information. If you have questions about this medicine, talk to your doctor, pharmacist, or health care provider.  2022 Elsevier/Gold Standard (2020-10-10 00:00:00)

## 2021-09-11 NOTE — Progress Notes (Signed)
[facility] Summary Report / Loop Recorder 

## 2021-09-11 NOTE — Progress Notes (Signed)
Patient [facility]   Established Patient Office Visit  Subjective:  Patient ID: Pamela [name], female    DOB: [birth year]  Age: 71 y.o. MRN: [id number]  CC:  Chief Complaint  Patient presents with   Follow-up    Pt is here today for her follow up visit today.  Pt states that she has a sore on her right breast and left buttocks. Pt states that she has a cough associated with wheezing that she has had a couple of days now. Pt also states that her insurance want cover the Symbicort inhaler.    HPI Pamela [name] is a very pleasant 71 year old female with a medical history significant for morbid obesity, essential hypertension, stage III chronic kidney disease, chronic diastolic CHF, chronic respiratory failure with hypoxia, history of COPD, history of sarcoidosis, history of stroke, history of lung cancer, and history of anemia of chronic disease presents for follow-up of her chronic conditions. Patient is also complaining of a boil to her right buttocks that has been present over the last 5 days.  Patient has not identified any provocative factors concerning right buttocks boil.  She characterizes it as tender, hard, and nondraining.  Patient has not been able to visualize pleural due to its location.  She has not attempted any over-the-counter interventions to alleviate this issue. Patient has a history of prediabetes and morbid obesity.  She has been following a low-carb, low-fat diet over the past several weeks.  Patient is not able to exercise due to COPD and chronic respiratory failure with hypoxia.  She denies any polyuria, polydipsia, or polyphagia.  Patient's weight has fluctuated over the last several months Patient endorses periodic wheezing and shortness of breath.  She states that she has been out of Symbicort over the past several weeks due to changes with her insurance.  She mostly notices lying flat.  She currently denies persistent coughing  or upper respiratory symptoms.  Patient has not experienced COPD exacerbation in greater than a year.  Past Medical History:  Diagnosis Date   Arthritis    Asthma    Cancer ([place])    lung, adenocarcinoma right lung 2012   CHF (congestive heart failure) (HCC)    Preserved EF   Congenital single kidney    With chronic kidney disease   COPD (chronic obstructive pulmonary disease) ([place])    Gout    Hypertension    Lung cancer ([place]) 2012   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 Patient notes it was consistent with sarcoidosis but actual pathology results not available.   Oxygen deficiency    Sarcoidosis    Sickle cell trait ([place])    Sleep apnea     Past Surgical History:  Procedure Laterality Date   LOOP RECORDER INSERTION N/A 09/08/2018   Procedure: LOOP RECORDER INSERTION;  Surgeon: Evans Lance, MD;  Location: [facility];  Service: Cardiovascular;  Laterality: N/A;   LUNG BIOPSY     TEE WITHOUT CARDIOVERSION N/A 09/08/2018   Procedure: TRANSESOPHAGEAL ECHOCARDIOGRAM (TEE);  Surgeon: Josue Hector, MD;  Location: [facility] ENDOSCOPY;  Service: Cardiovascular;  Laterality: N/A;  with loop   TUBAL LIGATION     VEIN LIGATION AND STRIPPING      Family History  Problem Relation Age of Onset  Hypertension Mother    Renal Disease Mother    Cancer Father        stomach   Heart disease Father        No details   Cancer Brother    Diabetes Brother    Cervical cancer Sister    Diabetes Sister    Multiple myeloma Sister     Social History   Socioeconomic History   Marital status: Divorced    Spouse name: Not on file   Number of children: 2   Years of education: Not on file   Highest education level: Not on file  Occupational History   Not on file  Tobacco Use   Smoking status: Never    Smokeless tobacco: Never  Vaping Use   Vaping Use: Never used  Substance and Sexual Activity   Alcohol use: No    Alcohol/week: 0.0 standard drinks   Drug use: No   Sexual activity: Not Currently  Other Topics Concern   Not on file  Social History Narrative   Lives with son.  One son is deceased.     Social Determinants of Health   Financial Resource Strain: Not on file  Food Insecurity: Food Insecurity Present   Worried About [place] in the Last Year: Sometimes true   Ran Out of Food in the Last Year: Sometimes true  Transportation Needs: No Transportation Needs   Lack of Transportation (Medical): No   Lack of Transportation (Non-Medical): No  Physical Activity: Not on file  Stress: Not on file  Social Connections: Not on file  Intimate Partner Violence: Not on file    Outpatient Medications Prior to Visit  Medication Sig Dispense Refill   acetaminophen (TYLENOL) 500 MG tablet Take 1,000 mg by mouth every 6 (six) hours as needed for mild pain.      albuterol (PROAIR HFA) 108 (90 Base) MCG/ACT inhaler Inhale 2 puffs into the lungs every 6 (six) hours as needed for wheezing or shortness of breath. 8 g 3   aspirin EC 81 MG tablet Take 1 tablet (81 mg total) by mouth daily. Swallow whole. 30 tablet 11   atorvastatin (LIPITOR) 40 MG tablet Take 1 tablet by mouth once daily 90 tablet 0   calcitRIOL (ROCALTROL) 0.25 MCG capsule Take 1 capsule (0.25 mcg total) by mouth daily. 90 capsule 2   docusate sodium (COLACE) 100 MG capsule Take 1 capsule (100 mg total) by mouth 2 (two) times daily. 60 capsule 0   famotidine (PEPCID) 20 MG tablet TAKE 1 TABLET BY MOUTH ONCE DAILY AFTER SUPPER 30 tablet 6   febuxostat (ULORIC) 40 MG tablet Take 1 tablet (40 mg total) by mouth daily. 90 tablet 1   furosemide (LASIX) 40 MG tablet TAKE 1 TABLET BY MOUTH TWICE DAILY FOR CHF 180 tablet 0   magnesium oxide (MAG-OX) 400 (241.3 Mg) MG tablet Take 1 tablet (400 mg total) by mouth daily. 30  tablet 0   montelukast (SINGULAIR) 10 MG tablet TAKE 1 TABLET BY MOUTH AT BEDTIME 90 tablet 0   potassium chloride SA (KLOR-CON M) 20 MEQ tablet TAKE 1  BY MOUTH ONCE DAILY 90 tablet 1   albuterol (PROVENTIL) (2.5 MG/3ML) 0.083% nebulizer solution USE 1 VIAL IN NEBULIZER EVERY 6 HOURS AS NEEDED FOR WHEEZING FOR SHORTNESS OF BREATH 300 mL 2   SYMBICORT 160-4.5 MCG/ACT inhaler Inhale 2 puffs into the lungs 2 (two) times daily. 11 g 6   No facility-administered medications prior to visit.  Allergies  Allergen Reactions   Sulfa Antibiotics Hives and Rash    ROS Review of Systems  Constitutional:  Negative for fatigue.  HENT: Negative.    Eyes: Negative.   Respiratory: Negative.    Cardiovascular: Negative.   Endocrine: Negative for polydipsia, polyphagia and polyuria.  Genitourinary: Negative.   Musculoskeletal:  Positive for arthralgias.  Skin:  Positive for wound.       Right buttocks wound  Neurological: Negative.   Psychiatric/Behavioral: Negative.       Objective:    Physical Exam Constitutional:      Appearance: She is obese.  Eyes:     Pupils: Pupils are equal, round, and reactive to light.  Cardiovascular:     Rate and Rhythm: Normal rate and regular rhythm.     Pulses: Normal pulses.  Pulmonary:     Effort: Pulmonary effort is normal.  Abdominal:     General: Bowel sounds are normal.  Skin:    General: Skin is warm.  Neurological:     General: No focal deficit present.     Mental Status: She is alert. Mental status is at baseline.  Psychiatric:        Mood and Affect: Mood normal.        Thought Content: Thought content normal.        Judgment: Judgment normal.    BP (!) 131/52    Pulse 65    Temp (!) 97.2 F (36.2 C)    Ht '5\' 6"'  (1.676 m)    Wt (!) 354 lb 3.2 oz (160.7 kg)    SpO2 98%    BMI 57.17 kg/m  Wt Readings from Last 3 Encounters:  09/11/21 (!) 354 lb 3.2 oz (160.7 kg)  07/15/21 (!) 361 lb 3.2 oz (163.8 kg)  06/27/21 (!) 355 lb 6.4 oz  (161.2 kg)     Health Maintenance Due  Topic Date Due   Zoster Vaccines- Shingrix (1 of 2) Never done   Fecal DNA (Cologuard)  12/10/2019   COVID-19 Vaccine (5 - Booster for Pfizer series) 05/01/2021    There are no preventive care reminders to display for this patient.  Lab Results  Component Value Date   TSH 3.934 05/21/2014   Lab Results  Component Value Date   WBC 4.7 12/13/2020   HGB 11.3 (L) 12/13/2020   HCT 36.2 12/13/2020   MCV 71.5 (L) 12/13/2020   PLT 157 12/13/2020   Lab Results  Component Value Date   NA 143 07/15/2021   K 3.9 07/15/2021   CHLORIDE 103 05/31/2017   CO2 26 07/15/2021   GLUCOSE 83 07/15/2021   BUN 40 (H) 07/15/2021   CREATININE 1.83 (H) 07/15/2021   BILITOT 0.3 07/15/2021   ALKPHOS 206 (H) 07/15/2021   AST 40 07/15/2021   ALT 32 07/15/2021   PROT 6.6 07/15/2021   ALBUMIN 3.9 07/15/2021   CALCIUM 8.6 (L) 07/15/2021   ANIONGAP 13 12/13/2020   EGFR 30 (L) 07/15/2021   Lab Results  Component Value Date   CHOL 130 03/01/2019   Lab Results  Component Value Date   HDL 48 03/01/2019   Lab Results  Component Value Date   LDLCALC 61 03/01/2019   Lab Results  Component Value Date   TRIG 105 03/01/2019   Lab Results  Component Value Date   CHOLHDL 2.7 03/01/2019   Lab Results  Component Value Date   HGBA1C 5.8 (A) 09/11/2021   HGBA1C 5.8 09/11/2021   HGBA1C 5.8 09/11/2021  HGBA1C 5.8 09/11/2021      Assessment & Plan:   Problem List Items Addressed This Visit       Respiratory   Asthma, chronic   Relevant Medications   albuterol (PROVENTIL) (2.5 MG/3ML) 0.083% nebulizer solution   Fluticasone-Salmeterol 55-14 MCG/ACT AEPB     Other   Sarcoidosis   Relevant Medications   albuterol (PROVENTIL) (2.5 MG/3ML) 0.083% nebulizer solution   Prediabetes - Primary   Relevant Orders   HgB A1c (Completed)   POCT URINALYSIS DIP (CLINITEK)   Other Visit Diagnoses     Cutaneous abscess of buttock       Relevant  Medications   doxycycline (VIBRA-TABS) 100 MG tablet       Meds ordered this encounter  Medications   albuterol (PROVENTIL) (2.5 MG/3ML) 0.083% nebulizer solution    Sig: USE 1 VIAL IN NEBULIZER EVERY 6 HOURS AS NEEDED FOR WHEEZING FOR SHORTNESS OF BREATH    Dispense:  300 mL    Refill:  2   doxycycline (VIBRA-TABS) 100 MG tablet    Sig: Take 1 tablet (100 mg total) by mouth 2 (two) times daily.    Dispense:  14 tablet    Refill:  0    Order Specific Question:   Supervising Provider    Answer:   Tresa Garter [id number]   Fluticasone-Salmeterol 55-14 MCG/ACT AEPB    Sig: Inhale 2 puffs into the lungs 2 (two) times daily.    Dispense:  1 each    Refill:  12    Order Specific Question:   Supervising Provider    Answer:   Tresa Garter [[id number]]  3. Prediabetes We will start a trial of semaglutide once weekly for prediabetes. - HgB A1c - POCT URINALYSIS DIP (CLINITEK) - Semaglutide,0.25 or 0.5MG/DOS, 2 MG/1.5ML SOPN; Inject 0.25 mg into the skin once a week.  Dispense: 1.5 mL; Refill: 11 - Basic Metabolic Panel  2. Sarcoidosis 3 - albuterol (PROVENTIL) (2.5 MG/3ML) 0.083% nebulizer solution; USE 1 VIAL IN NEBULIZER EVERY 6 HOURS AS NEEDED FOR WHEEZING FOR SHORTNESS OF BREATH  Dispense: 300 mL; Refill: 2  3. Cutaneous abscess of buttock  - doxycycline (VIBRA-TABS) 100 MG tablet; Take 1 tablet (100 mg total) by mouth 2 (two) times daily.  Dispense: 14 tablet; Refill: 0  4. Mild persistent chronic asthma without complication  - Fluticasone-Salmeterol 55-14 MCG/ACT AEPB; Inhale 2 puffs into the lungs 2 (two) times daily.  Dispense: 1 each; Refill: 12  5. BMI 50.0-59.9, adult (HCC)  - Semaglutide,0.25 or 0.5MG/DOS, 2 MG/1.5ML SOPN; Inject 0.25 mg into the skin once a week.  Dispense: 1.5 mL; Refill: 11  6. Stage 3 chronic kidney disease, unspecified whether stage 3a or 3b CKD ([place])  - Basic Metabolic Panel  7. Essential hypertension BP (!) 131/52    Pulse 65     Temp (!) 97.2 F (36.2 C)    Ht '5\' 6"'  (1.676 m)    Wt (!) 354 lb 3.2 oz (160.7 kg)    SpO2 98%    BMI 57.17 kg/m  - Continue medication, monitor blood pressure at home. Continue DASH diet.  Reminder to go to the ER if any CP, SOB, nausea, dizziness, severe HA, changes vision/speech, left arm numbness and tingling and jaw pain.   - Basic Metabolic Panel     Follow-up: Return in about 3 months (around 12/09/2021) for hypertension.     [name]  APRN, MSN, FNP-C [facility] [place], [place] [address] [phone]

## 2021-09-12 LAB — BASIC METABOLIC PANEL
BUN/Creatinine Ratio: 19 (ref 12–28)
BUN: 31 mg/dL — ABNORMAL HIGH (ref 8–27)
CO2: 27 mmol/L (ref 20–29)
Calcium: 8.6 mg/dL — ABNORMAL LOW (ref 8.7–10.3)
Chloride: 101 mmol/L (ref 96–106)
Creatinine, Ser: 1.67 mg/dL — ABNORMAL HIGH (ref 0.57–1.00)
Glucose: 82 mg/dL (ref 70–99)
Potassium: 4.3 mmol/L (ref 3.5–5.2)
Sodium: 143 mmol/L (ref 134–144)
eGFR: 33 mL/min/{1.73_m2} — ABNORMAL LOW (ref >59)

## 2021-09-15 ENCOUNTER — Other Ambulatory Visit: Payer: Self-pay | Admitting: *Deleted

## 2021-09-15 NOTE — Patient Outreach (Signed)
[facility]) Care Management  09/15/2021  Gia Lusher September 01, 1950 [id number]  Outreach attempt to patient. No answer and unable to leave voicemail message due to Home Phone: VM states that the phone is no longer in service. Cell Phone: VM states number cannot be completed as dialed. Please check the number and dial again.   Plan: RN Health Coach will call patient within the month of April.   Emelia Loron RN, BSN [place] [phone] [email or web address]

## 2021-09-18 ENCOUNTER — Other Ambulatory Visit: Payer: Self-pay | Admitting: Family Medicine

## 2021-09-19 ENCOUNTER — Encounter: Payer: Self-pay | Admitting: Family Medicine

## 2021-09-19 ENCOUNTER — Telehealth: Payer: Self-pay

## 2021-10-08 ENCOUNTER — Ambulatory Visit (INDEPENDENT_AMBULATORY_CARE_PROVIDER_SITE_OTHER): Payer: Medicare Other

## 2021-10-08 DIAGNOSIS — I639 Cerebral infarction, unspecified: Secondary | ICD-10-CM | POA: Diagnosis not present

## 2021-10-08 DIAGNOSIS — Z1211 Encounter for screening for malignant neoplasm of colon: Secondary | ICD-10-CM | POA: Diagnosis not present

## 2021-10-09 DIAGNOSIS — Z20822 Contact with and (suspected) exposure to covid-19: Secondary | ICD-10-CM | POA: Diagnosis not present

## 2021-10-09 LAB — CUP PACEART REMOTE DEVICE CHECK
Date Time Interrogation Session: 20230323004811
Implantable Pulse Generator Implant Date: 20200220

## 2021-10-14 ENCOUNTER — Ambulatory Visit: Payer: Medicare Other | Admitting: Family Medicine

## 2021-10-16 LAB — COLOGUARD: COLOGUARD: NEGATIVE

## 2021-10-18 DIAGNOSIS — Z20822 Contact with and (suspected) exposure to covid-19: Secondary | ICD-10-CM | POA: Diagnosis not present

## 2021-10-19 ENCOUNTER — Other Ambulatory Visit: Payer: Self-pay | Admitting: Internal Medicine

## 2021-10-19 DIAGNOSIS — I5030 Unspecified diastolic (congestive) heart failure: Secondary | ICD-10-CM

## 2021-10-21 ENCOUNTER — Other Ambulatory Visit: Payer: Self-pay | Admitting: *Deleted

## 2021-10-21 ENCOUNTER — Encounter: Payer: Self-pay | Admitting: Family Medicine

## 2021-10-21 ENCOUNTER — Ambulatory Visit (INDEPENDENT_AMBULATORY_CARE_PROVIDER_SITE_OTHER): Payer: Medicare Other | Admitting: Family Medicine

## 2021-10-21 VITALS — BP 136/70 | HR 69 | Temp 97.0°F | Ht 66.0 in | Wt 351.1 lb

## 2021-10-21 DIAGNOSIS — Z8709 Personal history of other diseases of the respiratory system: Secondary | ICD-10-CM

## 2021-10-21 DIAGNOSIS — J441 Chronic obstructive pulmonary disease with (acute) exacerbation: Secondary | ICD-10-CM

## 2021-10-21 DIAGNOSIS — R7303 Prediabetes: Secondary | ICD-10-CM

## 2021-10-21 DIAGNOSIS — I639 Cerebral infarction, unspecified: Secondary | ICD-10-CM

## 2021-10-21 LAB — POCT GLYCOSYLATED HEMOGLOBIN (HGB A1C)
HbA1c POC (<> result, manual entry): 5.5 % (ref 4.0–5.6)
HbA1c, POC (controlled diabetic range): 5.5 % (ref 0.0–7.0)
HbA1c, POC (prediabetic range): 5.5 % — AB (ref 5.7–6.4)
Hemoglobin A1C: 5.5 % (ref 4.0–5.6)

## 2021-10-21 MED ORDER — PREDNISONE 10 MG (21) PO TBPK: ORAL_TABLET | ORAL | 0 refills | Status: DC

## 2021-10-21 MED ORDER — BUDESONIDE-FORMOTEROL FUMARATE 160-4.5 MCG/ACT IN AERO: 2.0000 | INHALATION_SPRAY | Freq: Two times a day (BID) | RESPIRATORY_TRACT | 3 refills | Status: DC

## 2021-10-21 NOTE — Progress Notes (Signed)
[facility] Summary Report / Loop Recorder 

## 2021-10-21 NOTE — Patient Outreach (Signed)
[facility]) Care Management ? ?10/21/2021 ? ?Pamela [name] ?04-Oct-1950 ?[id number] ? ?Outreach attempt to patient. No answer and unable to leave voicemail message due to Home Phone: VM states that the phone is no longer in service. Cell Phone: VM states number cannot be completed as dialed. Please check the number and dial again. ?  ?Plan: RN Health Coach will call patient within the month of May and will send an unsuccessful letter to patient. ?  ?Emelia Loron RN, BSN ?[facility] Care Management  ?RN Health Coach ?[phone] ?[email or web address] ?  ? ?

## 2021-10-21 NOTE — Progress Notes (Signed)
? ?Patient [facility]  ?Established Patient Office Visit ? ?Subjective:  ?Patient ID: Pamela [name], female    DOB: 26-Jan-1951  Age: 71 y.o. MRN: [id number] ? ?CC:  ?Chief Complaint  ?Patient presents with  ? Follow-up  ?  Pt is here for 3 month follow up.  ? ? ?HPI ? ?Pamela [name] is a 71 year old female with a medical history significant for hypertension, morbid obesity, COPD, asthma, sarcoidosis, history of lung cancer, chronic diastolic CHF, prediabetes, hypokalemia and chronic asthma presents with a primary complaint of increased wheezing and shortness of breath over the past several days.  Patient states that she recently traveled to [facility] and upon return developed increased wheezing and shortness of breath.  She has not identified any provocative factors concerning current COPD/asthma exacerbation.  She states that she has been utilizing prescribed inhalers without very much relief.  She denies any dizziness, headache, blurry vision, chest pain, urinary symptoms, nausea, vomiting, or diarrhea. ? ?Wheezing  ?This is a new problem. The current episode started 1 to 4 weeks ago. The problem occurs constantly. The problem has been unchanged. Associated symptoms include coughing, a rash and shortness of breath. Pertinent negatives include no ear pain, fever, headaches, hemoptysis, neck pain, rhinorrhea, sputum production, swollen glands or vomiting. The symptoms are aggravated by pollens, any activity and weather changes. She has tried beta agonist inhalers, prescription cough suppressant and steroid inhaler for the symptoms. The treatment provided mild relief. Her past medical history is significant for asthma and COPD. There is no history of chronic lung disease.  ? ?Past Medical History:  ?Diagnosis Date  ? Arthritis   ? Asthma   ? Cancer [facility])   ? lung, adenocarcinoma right lung 2012  ? CHF (congestive heart failure) ([place])   ? Preserved EF  ? Congenital  single kidney   ? With chronic kidney disease  ? COPD (chronic obstructive pulmonary disease) ([place])   ? Gout   ? Hypertension   ? Lung cancer ([place]) 2012  ? Right upper lobe lung adenocarcinoma diagnosed with needle biopsy treated by SBRT finished treatment April 2013 has been monitored since  ? Mass of chest wall, right   ? Right chest wall mass 7.3 cm biopsy on 12/13/2013. Patient notes it was consistent with sarcoidosis but actual pathology results not available.  ? Oxygen deficiency   ? Sarcoidosis   ? Sickle cell trait ([place])   ? Sleep apnea   ? ? ?Past Surgical History:  ?Procedure Laterality Date  ? LOOP RECORDER INSERTION N/A 09/08/2018  ? Procedure: LOOP RECORDER INSERTION;  Surgeon: Evans Lance, MD;  Location: [place];  Service: Cardiovascular;  Laterality: N/A;  ? LUNG BIOPSY    ? TEE WITHOUT CARDIOVERSION N/A 09/08/2018  ? Procedure: TRANSESOPHAGEAL ECHOCARDIOGRAM (TEE);  Surgeon: Josue Hector, MD;  Location: [facility] ENDOSCOPY;  Service: Cardiovascular;  Laterality: N/A;  with loop  ? TUBAL LIGATION    ? VEIN LIGATION AND STRIPPING    ? ? ?Family History  ?Problem Relation Age of Onset  ? Hypertension Mother   ? Renal Disease Mother   ? Cancer Father   ?     stomach  ? Heart disease Father   ?     No details  ? Cancer Brother   ? Diabetes Brother   ? Cervical cancer Sister   ? Diabetes Sister   ? Multiple myeloma Sister   ? ? ?Social History  ? ?  Socioeconomic History  ? Marital status: Divorced  ?  Spouse name: Not on file  ? Number of children: 2  ? Years of education: Not on file  ? Highest education level: Not on file  ?Occupational History  ? Not on file  ?Tobacco Use  ? Smoking status: Never  ? Smokeless tobacco: Never  ?Vaping Use  ? Vaping Use: Never used  ?Substance and Sexual Activity  ? Alcohol use: No  ?  Alcohol/week: 0.0 standard drinks  ? Drug use: No  ? Sexual activity: Not Currently  ?Other Topics Concern  ? Not on file  ?Social History Narrative  ? Lives with son.  One son is  deceased.    ? ?Social Determinants of Health  ? ?Financial Resource Strain: Not on file  ?Food Insecurity: Food Insecurity Present  ? Worried About [employer] fundraiser in the Last Year: Sometimes true  ? Ran Out of Food in the Last Year: Sometimes true  ?Transportation Needs: No Transportation Needs  ? Lack of Transportation (Medical): No  ? Lack of Transportation (Non-Medical): No  ?Physical Activity: Not on file  ?Stress: Not on file  ?Social Connections: Not on file  ?Intimate Partner Violence: Not on file  ? ? ?Outpatient Medications Prior to Visit  ?Medication Sig Dispense Refill  ? acetaminophen (TYLENOL) 500 MG tablet Take 1,000 mg by mouth every 6 (six) hours as needed for mild pain.     ? albuterol (PROAIR HFA) 108 (90 Base) MCG/ACT inhaler Inhale 2 puffs into the lungs every 6 (six) hours as needed for wheezing or shortness of breath. 8 g 3  ? albuterol (PROVENTIL) (2.5 MG/3ML) 0.083% nebulizer solution USE 1 VIAL IN NEBULIZER EVERY 6 HOURS AS NEEDED FOR WHEEZING FOR SHORTNESS OF BREATH 300 mL 2  ? aspirin EC 81 MG tablet Take 1 tablet (81 mg total) by mouth daily. Swallow whole. 30 tablet 11  ? atorvastatin (LIPITOR) 40 MG tablet Take 1 tablet by mouth once daily 90 tablet 0  ? calcitRIOL (ROCALTROL) 0.25 MCG capsule Take 1 capsule by mouth once daily 90 capsule 0  ? docusate sodium (COLACE) 100 MG capsule Take 1 capsule (100 mg total) by mouth 2 (two) times daily. 60 capsule 0  ? famotidine (PEPCID) 20 MG tablet TAKE 1 TABLET BY MOUTH ONCE DAILY AFTER SUPPER 30 tablet 6  ? febuxostat (ULORIC) 40 MG tablet Take 1 tablet (40 mg total) by mouth daily. 90 tablet 1  ? furosemide (LASIX) 40 MG tablet Take 1 tablet by mouth twice daily 60 tablet 0  ? magnesium oxide (MAG-OX) 400 (241.3 Mg) MG tablet Take 1 tablet (400 mg total) by mouth daily. 30 tablet 0  ? montelukast (SINGULAIR) 10 MG tablet TAKE 1 TABLET BY MOUTH AT BEDTIME 90 tablet 0  ? potassium chloride SA (KLOR-CON M) 20 MEQ tablet TAKE 1  BY MOUTH  ONCE DAILY 90 tablet 1  ? Semaglutide,0.25 or 0.5MG/DOS, 2 MG/1.5ML SOPN Inject 0.25 mg into the skin once a week. 1.5 mL 11  ? doxycycline (VIBRA-TABS) 100 MG tablet Take 1 tablet (100 mg total) by mouth 2 (two) times daily. 14 tablet 0  ? Fluticasone-Salmeterol 55-14 MCG/ACT AEPB Inhale 2 puffs into the lungs 2 (two) times daily. 1 each 12  ? ?No facility-administered medications prior to visit.  ? ? ?Allergies  ?Allergen Reactions  ? Sulfa Antibiotics Hives and Rash  ? ? ?ROS ?Review of Systems  ?Constitutional:  Negative for fever.  ?HENT:  Negative  for ear pain and rhinorrhea.   ?Respiratory:  Positive for cough, shortness of breath and wheezing. Negative for hemoptysis and sputum production.   ?Gastrointestinal:  Negative for vomiting.  ?Musculoskeletal:  Negative for neck pain.  ?Skin:  Positive for rash.  ?Neurological:  Negative for headaches.  ? ?  ?Objective:  ?  ?Physical Exam ?Constitutional:   ?   Appearance: Normal appearance.  ?Eyes:  ?   Pupils: Pupils are equal, round, and reactive to light.  ?Cardiovascular:  ?   Rate and Rhythm: Normal rate and regular rhythm.  ?   Pulses: Normal pulses.  ?Pulmonary:  ?   Effort: Pulmonary effort is normal.  ?   Breath sounds: Normal breath sounds.  ?Abdominal:  ?   General: Bowel sounds are normal.  ?Neurological:  ?   General: No focal deficit present.  ?   Mental Status: She is alert. Mental status is at baseline.  ?Psychiatric:     ?   Mood and Affect: Mood normal.     ?   Behavior: Behavior normal.     ?   Thought Content: Thought content normal.     ?   Judgment: Judgment normal.  ? ? ?BP 136/70 (BP Location: Right Arm, Patient Position: Sitting, Cuff Size: Large)   Pulse 69   Temp (!) 97 ?F (36.1 ?C)   Ht _0  (1.676 m)   Wt (!) 351 lb 2 oz (159.3 kg)   SpO2 96%   BMI 56.67 kg/m?  ?Wt Readings from Last 3 Encounters:  ?10/21/21 (!) 351 lb 2 oz (159.3 kg)  ?09/11/21 (!) 354 lb 3.2 oz (160.7 kg)  ?07/15/21 (!) 361 lb 3.2 oz (163.8 kg)  ? ? ? ?Health  Maintenance Due  ?Topic Date Due  ? Zoster Vaccines- Shingrix (1 of 2) Never done  ? COVID-19 Vaccine (5 - Booster for [place] series) 05/01/2021  ? ? ?There are no preventive care reminders to display for this pati

## 2021-10-21 NOTE — Patient Instructions (Signed)
We will start a steroid taper for wheezing, shortness of breath, and/or coughing. ?Recommend following up at the nearest emergency department if current symptoms worsen.Chronic Obstructive Pulmonary Disease ?Chronic obstructive pulmonary disease (COPD) is a long-term (chronic) lung problem. When you have COPD, it is hard for air to get in and out of your lungs. ?Usually the condition gets worse over time, and your lungs will never return to normal. There are things you can do to keep yourself as healthy as possible. ?What are the causes? ?Smoking. This is the most common cause. ?Certain genes passed from parent to child (inherited). ?What increases the risk? ?Being exposed to secondhand smoke from cigarettes, pipes, or cigars. ?Being exposed to chemicals and other irritants, such as fumes and dust in the work environment. ?Having chronic lung conditions or infections. ?What are the signs or symptoms? ?Shortness of breath, especially during physical activity. ?A long-term cough with a large amount of thick mucus. Sometimes, the cough may not have any mucus (dry cough). ?Wheezing. ?Breathing quickly. ?Skin that looks gray or blue, especially in the fingers, toes, or lips. ?Feeling tired (fatigue). ?Weight loss. ?Chest tightness. ?Having infections often. ?Episodes when breathing symptoms become much worse (exacerbations). ?At the later stages of this disease, you may have swelling in the ankles, feet, or legs. ?How is this treated? ?Taking medicines. ?Quitting smoking, if you smoke. ?Rehabilitation. This includes steps to make your body work better. It may involve a team of specialists. ?Doing exercises. ?Making changes to your diet. ?Using oxygen. ?Lung surgery. ?Lung transplant. ?Comfort measures (palliative care). ?Follow these instructions at home: ?Medicines ?Take over-the-counter and prescription medicines only as told by your doctor. ?Talk to your doctor before taking any cough or allergy medicines. You may  need to avoid medicines that cause your lungs to be dry. ?Lifestyle ?If you smoke, stop smoking. Smoking makes the problem worse. ?Do not smoke or use any products that contain nicotine or tobacco. If you need help quitting, ask your doctor. ?Avoid being around things that make your breathing worse. This may include smoke, chemicals, and fumes. ?Stay active, but remember to rest as well. ?Learn and use tips on how to manage stress and control your breathing. ?Make sure you get enough sleep. Most adults need at least 7 hours of sleep every night. ?Eat healthy foods. Eat smaller meals more often. Rest before meals. ?Controlled breathing ?Learn and use tips on how to control your breathing as told by your doctor. Try: ?Breathing in (inhaling) through your nose for 1 second. Then, pucker your lips and breath out (exhale) through your lips for 2 seconds. ?Putting one hand on your belly (abdomen). Breathe in slowly through your nose for 1 second. Your hand on your belly should move out. Pucker your lips and breathe out slowly through your lips. Your hand on your belly should move in as you breathe out. ? ?Controlled coughing ?Learn and use controlled coughing to clear mucus from your lungs. Follow these steps: ?Lean your head a little forward. ?Breathe in deeply. ?Try to hold your breath for 3 seconds. ?Keep your mouth slightly open while coughing 2 times. ?Spit any mucus out into a tissue. ?Rest and do the steps again 1 or 2 times as needed. ?General instructions ?Make sure you get all the shots (vaccines) that your doctor recommends. Ask your doctor about a flu shot and a pneumonia shot. ?Use oxygen therapy and pulmonary rehabilitation if told by your doctor. If you need home oxygen therapy, ask your doctor  if you should buy a tool to measure your oxygen level (oximeter). ?Make a COPD action plan with your doctor. This helps you to know what to do if you feel worse than usual. ?Manage any other conditions you have as  told by your doctor. ?Avoid going outside when it is very hot, cold, or humid. ?Avoid people who have a sickness you can catch (contagious). ?Keep all follow-up visits. ?Contact a doctor if: ?You cough up more mucus than usual. ?There is a change in the color or thickness of the mucus. ?It is harder to breathe than usual. ?Your breathing is faster than usual. ?You have trouble sleeping. ?You need to use your medicines more often than usual. ?You have trouble doing your normal activities such as getting dressed or walking around the house. ?Get help right away if: ?You have shortness of breath while resting. ?You have shortness of breath that stops you from: ?Being able to talk. ?Doing normal activities. ?Your chest hurts for longer than 5 minutes. ?Your skin color is more blue than usual. ?Your pulse oximeter shows that you have low oxygen for longer than 5 minutes. ?You have a fever. ?You feel too tired to breathe normally. ?These symptoms may represent a serious problem that is an emergency. Do not wait to see if the symptoms will go away. Get medical help right away. Call your local emergency services (911 in the [place] Do not drive yourself to the hospital. ?Summary ?Chronic obstructive pulmonary disease (COPD) is a long-term lung problem. ?The way your lungs work will never return to normal. Usually the condition gets worse over time. There are things you can do to keep yourself as healthy as possible. ?Take over-the-counter and prescription medicines only as told by your doctor. ?If you smoke, stop. Smoking makes the problem worse. ?This information is not intended to replace advice given to you by your health care provider. Make sure you discuss any questions you have with your health care provider. ?Document Revised: 05/14/2020 Document Reviewed: 05/14/2020 ?Elsevier Patient Education ? [employer]. ? ?

## 2021-10-23 ENCOUNTER — Ambulatory Visit: Payer: Medicare Other | Admitting: Podiatry

## 2021-10-27 ENCOUNTER — Other Ambulatory Visit: Payer: Self-pay

## 2021-10-27 ENCOUNTER — Telehealth: Payer: Self-pay

## 2021-10-27 DIAGNOSIS — I5030 Unspecified diastolic (congestive) heart failure: Secondary | ICD-10-CM

## 2021-10-27 MED ORDER — FUROSEMIDE 40 MG PO TABS: 40.0000 mg | ORAL_TABLET | Freq: Two times a day (BID) | ORAL | 2 refills | Status: DC

## 2021-10-27 NOTE — Telephone Encounter (Signed)
Lasix was refilled and sent to to the patients pharmacy. I am not sure what the other medication is and its not on her medication list. ?

## 2021-10-27 NOTE — Telephone Encounter (Signed)
Lasix  ?Calintrate ? ?

## 2021-11-02 DIAGNOSIS — Z20822 Contact with and (suspected) exposure to covid-19: Secondary | ICD-10-CM | POA: Diagnosis not present

## 2021-11-05 ENCOUNTER — Telehealth: Payer: Self-pay

## 2021-11-05 NOTE — Telephone Encounter (Signed)
Calcitriol

## 2021-11-06 ENCOUNTER — Telehealth: Payer: Self-pay | Admitting: Hematology

## 2021-11-06 NOTE — Telephone Encounter (Signed)
Patients refills were sent to the [facility] on 09/22/21 with a 90 day supply (3 months worth). Patient will need to call her pharmacy. ?

## 2021-11-06 NOTE — Telephone Encounter (Signed)
Per provider reschedule called and spoke to pt about appointment changes pt confirmed appointment  ?

## 2021-11-10 ENCOUNTER — Ambulatory Visit (INDEPENDENT_AMBULATORY_CARE_PROVIDER_SITE_OTHER): Payer: Medicare Other

## 2021-11-10 DIAGNOSIS — I639 Cerebral infarction, unspecified: Secondary | ICD-10-CM

## 2021-11-10 LAB — CUP PACEART REMOTE DEVICE CHECK
Date Time Interrogation Session: 20230423232446
Implantable Pulse Generator Implant Date: 20200220

## 2021-11-11 DIAGNOSIS — Z20822 Contact with and (suspected) exposure to covid-19: Secondary | ICD-10-CM | POA: Diagnosis not present

## 2021-11-12 ENCOUNTER — Encounter: Payer: Self-pay | Admitting: Internal Medicine

## 2021-11-12 ENCOUNTER — Ambulatory Visit (INDEPENDENT_AMBULATORY_CARE_PROVIDER_SITE_OTHER): Payer: Medicare Other | Admitting: Internal Medicine

## 2021-11-12 DIAGNOSIS — J4520 Mild intermittent asthma, uncomplicated: Secondary | ICD-10-CM | POA: Diagnosis not present

## 2021-11-12 DIAGNOSIS — I639 Cerebral infarction, unspecified: Secondary | ICD-10-CM

## 2021-11-12 DIAGNOSIS — Z20822 Contact with and (suspected) exposure to covid-19: Secondary | ICD-10-CM | POA: Diagnosis not present

## 2021-11-12 MED ORDER — FLUTICASONE-SALMETEROL 55-14 MCG/ACT IN AEPB: INHALATION_SPRAY | RESPIRATORY_TRACT | Status: DC

## 2021-11-12 NOTE — Patient Instructions (Addendum)
Plan A = Automatic = Always=   Fluticasone is Take 2 puffs (2 separate clicks) first thing in am and then another 2 puffs about 12 hours later.  ?  ? ? ?Plan B = Backup (to supplement plan A, not to replace it) ?Only use your albuterol inhaler as a rescue medication to be used if you can't catch your breath by resting or doing a relaxed purse lip breathing pattern.  ?- The less you use it, the better it will work when you need it. ?- Ok to use the inhaler up to 2 puffs  every 4 hours if you must but call for appointment if use goes up over your usual need ?- Don't leave home without it !!  (think of it like starter or a spare tire for your car)  ? ?Plan C = Crisis (instead of Plan B but only if Plan B stops working) ?- only use your albuterol nebulizer if you first try Plan B and it fails to help > ok to use the nebulizer up to every 4 hours but if start needing it regularly call for immediate appointment ? ? ?Please schedule a follow up visit in 12  months but call sooner if needed  ?

## 2021-11-12 NOTE — Assessment & Plan Note (Addendum)
Onset 1990's while living in [name]  ?pfts 07/31/13  FEV1  1.53 (58%) with ratio 66 > p saba ratio 74 FEV1 1.68 (64%)  ?- -04/27/2014  try dulera 100 2 bid  ?- PFT's 07/19/2014 FEV1 2.00 (93%) and ratio 85 after 21% improvement from saba with no inhalers x one week  ?- trial off acei 12/01/2014 due to pseudoasthma component > resolved  ?- Spirometry 06/01/2016  FEV1 1.06 (48%)  Ratio 61  ?- FENO 06/01/2016  =   85 on symbicort 80 x2 > increase to 160  ?- Prednisone 10 mg floor x years, rec increase  to 20 mg if needing neb  ?- 10/15/2016 reduced pred to 5 mg daily (gout flares with taper)  ?- Spirometry 02/22/2018  FEV1 1.23 (57%)  Ratio 71 with min curvature  ?- FENO 02/22/2018  = 61 ?- 02/22/2018  After extensive coaching inhaler device  effectiveness =    90% from floor of 75%   ? ?Over using saba in am likely because missing pm doses of laba/ics  ? ?See ABC action plan on AVS  ? ?- The proper method of use, as well as anticipated side effects, of a metered-dose inhaler were discussed and demonstrated to the patient using teach back method.  ? ?Advised: ?Re SABA :  I spent extra time with pt today reviewing appropriate use of albuterol for prn use on exertion with the following points: ?1) saba is for relief of sob that does not improve by walking a slower pace or resting but rather if the pt does not improve after trying this first. ?2) If the pt is convinced, as many are, that saba helps recover from activity faster then it's easy to tell if this is the case by re-challenging : ie stop, take the inhaler, then p 5 minutes try the exact same activity (intensity of workload) that just caused the symptoms and see if they are substantially diminished or not after saba ?3) if there is an activity that reproducibly causes the symptoms, try the saba 15 min before the activity on alternate days  ? ?If in fact the saba really does help, then fine to continue to use it prn but advised may need to look closer at the maintenance  regimen being used to achieve better control of airways disease with exertion.  ? ?F/u can be yearly , sooner if needed ? ? ?

## 2021-11-12 NOTE — Assessment & Plan Note (Addendum)
Complicated by hbp/ pre-dm ? ?Body mass index is 57.23 kg/m?.  -  Trending up ?Lab Results  ?Component Value Date  ? TSH 3.934 05/21/2014  ?  ? ? ?Contributing to doe and risk of GERD ?>>>   reviewed the need and the process to achieve and maintain neg calorie balance > defer f/u primary care including intermittently monitoring thyroid status    ? ? ?    ?  ? ?Each maintenance medication was reviewed in detail including emphasizing most importantly the difference between maintenance and prns and under what circumstances the prns are to be triggered using an action plan format where appropriate. ? ?Total time for H and P, chart review, counseling, reviewing dpi/hfa/neb device(s) and generating customized AVS unique to this office visit / same day charting for this annual asthma eval  = 32 min  ?     ?

## 2021-11-12 NOTE — Progress Notes (Addendum)
? ? ? ? ? ? ? ? ? ?Subjective:  ? ? Patient ID: Pamela [name], female    DOB: 06-10-1951  MRN: [id number] ? 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 ?  ? ? ?History of Present Illness  ?04/27/2014 1st [facility] office visit/ Pamela [name]   ?Chief Complaint  ?Patient presents with  ? Pulmonary Consult  ?  Referred by Dr. [name]. [name]  Pt recently moved to [place] from [name] and states needs to establish with new pulmonary MD.  She c/o DOE and cough, and states that she feels these symptoms are currently under control.    ?on prednisone 10 mg daily  since late July 2015 and still on freq saba and 2lpm  ?Already used   2puffs proair am of ov  ?rec ?Stop spiriva and advair ?Start dulera 100 Take 2 puffs first thing in am and then another 2 puffs about 12 hours later.  ?Work on inhaler technique:  relax and gently blow all the way out then take a nice smooth deep breath back in, triggering the inhaler at same time you start breathing in.  Hold for up to 5 seconds if you can.  Rinse and gargle with water when done ?Only use your albuterol (proair) as a rescue medication  ?If proair not helping, then use the neb and if needing the neb more than occastional, then take prednisone 10 mg daily  ?Please schedule a follow up office visit in 4 weeks, sooner if needed with all inhalers in hand for pfts on return ? ? ? ? ?04/01/2020  f/u ov/Pamela [name] re:  Asthma / MO  ?Chief Complaint  ?Patient presents with  ? Follow-up  ?  Reports productive cough x4 days.  ?Dyspnea:  [employer] with cane with or without 02  ?Cough: since 9/9 clear  mucus ?Sleeping: flat bed with with 3 pillows  ?SABA use: neither right now  ?02: prn  ?rec ?Plan A = Automatic = Always=    Symbicort 160 Take 2 puffs first thing in am and then another 2 puffs about 12 hours later.  ?And singulair at night and stop spiriva  ?Work on inhaler technique:   ?Plan B = Backup (to supplement plan A, not to replace it) ?Only use your albuterol inhaler (puffer) as a rescue medication  ?Plan C = Crisis (instead of Plan B but only if Plan B stops working) ?- only use your albuterol nebulizer if you first try Plan B and it fails to help > ok to use the nebulizer up to every 4 hours but if start needing it regularly call for immediate appointment ?Plan D = Deltasone  ?- call me if B and C not adequate > add prednisone Prednisone 10 mg take  4 each am x 2 days,   2 each am x 2 days,  1 each am x 2 days and stop (refillable)  ?Plan E = ER ?- go to ER or call 911 if all else fails   ?Make sure you check your oxygen  saturations at highest level  ?Pantoprazole (protonix) 40 mg   Take  30-60 min before first meal of the day and Pepcid (famotidine)  20 mg one @  bedtime until return to office - this is the best way to tell whether stomach acid is contributing to your problem.   ?GERD diet ? ? ?05/13/2020  f/u ov/Pamela [name] re:  Asthma / MO/ maint on symbicort 160/ singulair though hfa poor  ?Chief Complaint  ?Patient presents with  ? Follow-up  ?  Denies any problems with breathing  ? Dyspnea:  [employer] with cane  - walks with sats always above 90% ?Cough: none  ?Sleeping: flat bed, 3 pillows "like a baby" ?SABA use: symbicort 80 2bid / not needing saba at all/ no need for prednisone ?02: none at all since [name] trip  ?rec ?Stop pantoprazole and change famotidine twice daily after bfast and after supper for full week then just the dose after supper for a week then stop - if you have any obvious problems start back on previous  ?GERD diet ?Ok to stop the oxygen ? ? ?11/12/2020  f/u ov/Pamela [name] re: asthma/ MO -  no longer on ppi or singulair x months ?Chief Complaint  ?Patient presents with  ? Follow-up  ?  Sob-same, no cough  ? Dyspnea:  Does [employer] shopping pushing the cart, walks with cane  ?Cough: none  ?Sleeping: flat bed/ 3 pillows  ?SABA use: none / symbicort 160 2bid  ?02: none  ?Covid status:  vax x 3/ pfizer  ?Rec ?Plan A = Automatic = Always=    symbicort 160 Take 2 puffs first thing in am and then another 2 puffs about 12 hours later.  ?Plan B = Backup (to supplement plan A, not to replace it) ?Only use your albuterol inhaler as a rescue medication  ?Plan C = Crisis (instead of Plan B but only if Plan B stops working) ?- only use your albuterol nebulizer if you first try Plan B  ? ? ?11/12/2021  Annual f/u ov/Pamela [name] re: asthma / MO  maint on generic advair 55 2 each am  (supposed to be two bid  ?Chief Complaint  ?Patient presents with  ? Follow-up  ?  Patient feels like she is doing good with her breathing, no concerns  ?Dyspnea:  [employer] shopping with a cane  ?Cough: none  ?Sleeping: flat bed/ 3 pillows  ?SABA use: nebulizer daily  ?02: none  ?Covid status:   vax x 4  ? ? ?No obvious day to day or daytime variability or assoc excess/ purulent sputum or mucus plugs or hemoptysis or cp or chest tightness, subjective wheeze or overt sinus or hb symptoms.  ? ?Sleeping as above  without nocturnal  or early am exacerbation  of respiratory  c/o's or need for noct saba. Also denies any obvious fluctuation of symptoms with weather or environmental changes or other aggravating or alleviating factors except as outlined above  ? ?No unusual exposure hx or h/o childhood pna/ asthma or knowledge of premature birth. ? ?Current Allergies, Complete Past Medical History, Past Surgical History, Family History, and Social History were reviewed in [employer] record. ? ?ROS  The following are not active complaints unless bolded ?Hoarseness, sore throat, dysphagia, dental problems, itching, sneezing,  nasal  congestion or discharge of excess mucus or purulent secretions, ear ache,   fever, chills, sweats, unintended wt loss or wt gain, classically pleuritic or exertional cp,  orthopnea pnd or arm/hand swelling  or leg swelling, presyncope, palpitations, abdominal pain, anorexia, nausea, vomiting, diarrhea  or change in bowel habits or change in bladder habits, change in stools or change in urine, dysuria, hematuria,  rash, arthralgias, visual complaints, headache, numbness, weakness or ataxia or problems with walking or coordination,  change in mood or  memory. ?      ? ?Current Meds  ?Medication Sig  ? acetaminophen (TYLENOL) 500 MG tablet Take 1,000 mg by mouth every 6 (six) hours as needed for mild pain.   ? albuterol (PROAIR HFA) 108 (90 Base) MCG/ACT inhaler Inhale 2 puffs into the lungs every 6 (six) hours as needed for wheezing or shortness of breath.  ? albuterol (PROVENTIL) (2.5 MG/3ML) 0.083% nebulizer solution USE 1 VIAL IN NEBULIZER EVERY 6 HOURS AS NEEDED FOR WHEEZING FOR SHORTNESS OF BREATH  ? aspirin EC 81 MG tablet Take 1 tablet (81 mg total) by mouth daily. Swallow whole.  ? atorvastatin (LIPITOR) 40 MG tablet Take 1 tablet by mouth once daily  ? calcitRIOL (ROCALTROL) 0.25 MCG capsule Take 1 capsule by mouth once daily  ? docusate sodium (COLACE) 100 MG capsule Take 1 capsule (100 mg total) by mouth 2 (two) times daily.  ? famotidine (PEPCID) 20 MG tablet TAKE 1 TABLET BY MOUTH ONCE DAILY AFTER SUPPER  ? febuxostat (ULORIC) 40 MG tablet Take 1 tablet (40 mg total) by mouth daily.  ? Fluticasone-Salmeterol 55-14 MCG/ACT AEPB Inhale 2 puffs into the lungs 2 (two) times daily.  ? furosemide (LASIX) 40 MG tablet Take 1 tablet (40 mg total) by mouth 2 (two) times daily.  ? magnesium oxide (MAG-OX) 400 (241.3 Mg) MG tablet Take 1 tablet (400 mg total) by mouth daily.  ? montelukast (SINGULAIR) 10 MG tablet TAKE 1 TABLET BY MOUTH AT BEDTIME  ? potassium chloride SA (KLOR-CON M) 20 MEQ tablet TAKE 1  BY MOUTH  ONCE DAILY  ? [DISCONTINUED] budesonide-formoterol (SYMBICORT) 160-4.5 MCG/ACT inhaler Inhale 2 puffs into the lungs 2 (two) times daily.  ?    ? ? ?  ?Objective:  ? Physical Exam ? ?Wts ? ?11/12/2021   354  ?11/12/2020

## 2021-11-15 DIAGNOSIS — Z20822 Contact with and (suspected) exposure to covid-19: Secondary | ICD-10-CM | POA: Diagnosis not present

## 2021-11-17 DIAGNOSIS — Z20822 Contact with and (suspected) exposure to covid-19: Secondary | ICD-10-CM | POA: Diagnosis not present

## 2021-11-18 ENCOUNTER — Telehealth: Payer: Self-pay

## 2021-11-18 DIAGNOSIS — N179 Acute kidney failure, unspecified: Secondary | ICD-10-CM | POA: Diagnosis not present

## 2021-11-18 DIAGNOSIS — I129 Hypertensive chronic kidney disease with stage 1 through stage 4 chronic kidney disease, or unspecified chronic kidney disease: Secondary | ICD-10-CM | POA: Diagnosis not present

## 2021-11-18 DIAGNOSIS — C3490 Malignant neoplasm of unspecified part of unspecified bronchus or lung: Secondary | ICD-10-CM | POA: Diagnosis not present

## 2021-11-18 DIAGNOSIS — I5033 Acute on chronic diastolic (congestive) heart failure: Secondary | ICD-10-CM | POA: Diagnosis not present

## 2021-11-18 DIAGNOSIS — N184 Chronic kidney disease, stage 4 (severe): Secondary | ICD-10-CM | POA: Diagnosis not present

## 2021-11-18 DIAGNOSIS — N2581 Secondary hyperparathyroidism of renal origin: Secondary | ICD-10-CM | POA: Diagnosis not present

## 2021-11-18 DIAGNOSIS — M109 Gout, unspecified: Secondary | ICD-10-CM | POA: Diagnosis not present

## 2021-11-18 DIAGNOSIS — N390 Urinary tract infection, site not specified: Secondary | ICD-10-CM | POA: Diagnosis not present

## 2021-11-18 DIAGNOSIS — D631 Anemia in chronic kidney disease: Secondary | ICD-10-CM | POA: Diagnosis not present

## 2021-11-18 NOTE — Telephone Encounter (Signed)
Calcitriol ?Furosemide ? ?

## 2021-11-19 ENCOUNTER — Other Ambulatory Visit: Payer: Self-pay

## 2021-11-19 MED ORDER — CALCITRIOL 0.25 MCG PO CAPS
0.2500 ug | ORAL_CAPSULE | Freq: Every day | ORAL | 0 refills | Status: DC
Start: 2021-11-19 — End: 2022-02-19

## 2021-11-19 NOTE — Telephone Encounter (Signed)
Rx refills have been sent to the patients pharmacy on file. Patient will need to call the pharmacy for her refills. ?

## 2021-11-20 ENCOUNTER — Other Ambulatory Visit: Payer: Self-pay | Admitting: *Deleted

## 2021-11-20 ENCOUNTER — Ambulatory Visit (HOSPITAL_COMMUNITY)
Admission: RE | Admit: 2021-11-20 | Discharge: 2021-11-20 | Disposition: A | Discharge status: Home / Self Care | Payer: Medicare Other | Source: Ambulatory Visit | Attending: Hematology | Admitting: Hematology

## 2021-11-20 DIAGNOSIS — J8410 Pulmonary fibrosis, unspecified: Secondary | ICD-10-CM | POA: Diagnosis not present

## 2021-11-20 DIAGNOSIS — Z85118 Personal history of other malignant neoplasm of bronchus and lung: Secondary | ICD-10-CM | POA: Diagnosis not present

## 2021-11-20 DIAGNOSIS — C3490 Malignant neoplasm of unspecified part of unspecified bronchus or lung: Secondary | ICD-10-CM | POA: Diagnosis not present

## 2021-11-20 DIAGNOSIS — J941 Fibrothorax: Secondary | ICD-10-CM | POA: Diagnosis not present

## 2021-11-20 DIAGNOSIS — K449 Diaphragmatic hernia without obstruction or gangrene: Secondary | ICD-10-CM | POA: Diagnosis not present

## 2021-11-20 NOTE — Patient Outreach (Signed)
[facility]) Care Management ? ?11/20/2021 ? ?Pamela [name] ?May 06, 1951 ?[id number] ? ?Outreach attempt #7 to patient. No answer and unable to leave voicemail message due to Home Phone: VM states that the phone is no longer in service. Cell Phone: VM states number cannot be completed as dialed. Please check the number and dial again. Nurse previously sent an unsuccessful letter on 10/21/21. ? ?Plan: RN Health Coach will call patient within the month of June and will plan to close patient's case if unable to reach at that time. ? ?Emelia Loron RN, BSN ?[facility] Care Management  ?RN Health Coach ?[phone] ?[email or web address] ? ?

## 2021-11-24 DIAGNOSIS — Z20822 Contact with and (suspected) exposure to covid-19: Secondary | ICD-10-CM | POA: Diagnosis not present

## 2021-11-26 NOTE — Progress Notes (Signed)
[facility] Summary Report / Loop Recorder 

## 2021-12-05 DIAGNOSIS — Z0289 Encounter for other administrative examinations: Secondary | ICD-10-CM

## 2021-12-11 ENCOUNTER — Other Ambulatory Visit: Payer: Self-pay

## 2021-12-11 DIAGNOSIS — C3490 Malignant neoplasm of unspecified part of unspecified bronchus or lung: Secondary | ICD-10-CM

## 2021-12-12 ENCOUNTER — Other Ambulatory Visit: Payer: Self-pay

## 2021-12-12 ENCOUNTER — Ambulatory Visit: Payer: Medicare Other | Admitting: Hematology

## 2021-12-12 ENCOUNTER — Inpatient Hospital Stay (HOSPITAL_BASED_OUTPATIENT_CLINIC_OR_DEPARTMENT_OTHER): Payer: Medicare Other | Admitting: Hematology

## 2021-12-12 ENCOUNTER — Other Ambulatory Visit: Payer: Medicare Other

## 2021-12-12 ENCOUNTER — Inpatient Hospital Stay: Payer: Medicare Other | Attending: Hematology

## 2021-12-12 VITALS — BP 136/58 | HR 72 | Temp 97.8°F | Resp 20 | Ht 66.0 in | Wt 359.5 lb

## 2021-12-12 DIAGNOSIS — I130 Hypertensive heart and chronic kidney disease with heart failure and stage 1 through stage 4 chronic kidney disease, or unspecified chronic kidney disease: Secondary | ICD-10-CM | POA: Insufficient documentation

## 2021-12-12 DIAGNOSIS — C3490 Malignant neoplasm of unspecified part of unspecified bronchus or lung: Secondary | ICD-10-CM

## 2021-12-12 DIAGNOSIS — I509 Heart failure, unspecified: Secondary | ICD-10-CM | POA: Insufficient documentation

## 2021-12-12 DIAGNOSIS — Z800 Family history of malignant neoplasm of digestive organs: Secondary | ICD-10-CM | POA: Diagnosis not present

## 2021-12-12 DIAGNOSIS — D573 Sickle-cell trait: Secondary | ICD-10-CM | POA: Diagnosis not present

## 2021-12-12 DIAGNOSIS — D869 Sarcoidosis, unspecified: Secondary | ICD-10-CM | POA: Insufficient documentation

## 2021-12-12 DIAGNOSIS — Z807 Family history of other malignant neoplasms of lymphoid, hematopoietic and related tissues: Secondary | ICD-10-CM | POA: Insufficient documentation

## 2021-12-12 DIAGNOSIS — Z85118 Personal history of other malignant neoplasm of bronchus and lung: Secondary | ICD-10-CM | POA: Insufficient documentation

## 2021-12-12 DIAGNOSIS — N189 Chronic kidney disease, unspecified: Secondary | ICD-10-CM | POA: Insufficient documentation

## 2021-12-12 LAB — CBC WITH DIFFERENTIAL (CANCER CENTER ONLY)
Abs Immature Granulocytes: 0.02 10*3/uL (ref 0.00–0.07)
Basophils Absolute: 0 10*3/uL (ref 0.0–0.1)
Basophils Relative: 0 %
Eosinophils Absolute: 0.7 10*3/uL — ABNORMAL HIGH (ref 0.0–0.5)
Eosinophils Relative: 13 %
HCT: 35.5 % — ABNORMAL LOW (ref 36.0–46.0)
Hemoglobin: 11.2 g/dL — ABNORMAL LOW (ref 12.0–15.0)
Immature Granulocytes: 0 %
Lymphocytes Relative: 23 %
Lymphs Abs: 1.3 10*3/uL (ref 0.7–4.0)
MCH: 22.1 pg — ABNORMAL LOW (ref 26.0–34.0)
MCHC: 31.5 g/dL (ref 30.0–36.0)
MCV: 70.2 fL — ABNORMAL LOW (ref 80.0–100.0)
Monocytes Absolute: 0.5 10*3/uL (ref 0.1–1.0)
Monocytes Relative: 9 %
Neutro Abs: 3 10*3/uL (ref 1.7–7.7)
Neutrophils Relative %: 55 %
Platelet Count: 150 10*3/uL (ref 150–400)
RBC: 5.06 MIL/uL (ref 3.87–5.11)
RDW: 15.3 % (ref 11.5–15.5)
WBC Count: 5.6 10*3/uL (ref 4.0–10.5)
nRBC: 0 % (ref 0.0–0.2)

## 2021-12-12 LAB — CMP (CANCER CENTER ONLY)
ALT: 9 U/L (ref 0–44)
AST: 16 U/L (ref 15–41)
Albumin: 3.8 g/dL (ref 3.5–5.0)
Alkaline Phosphatase: 166 U/L — ABNORMAL HIGH (ref 38–126)
Anion gap: 8 (ref 5–15)
BUN: 41 mg/dL — ABNORMAL HIGH (ref 8–23)
CO2: 32 mmol/L (ref 22–32)
Calcium: 9.2 mg/dL (ref 8.9–10.3)
Chloride: 101 mmol/L (ref 98–111)
Creatinine: 1.89 mg/dL — ABNORMAL HIGH (ref 0.44–1.00)
GFR, Estimated: 28 mL/min — ABNORMAL LOW (ref >60)
Glucose, Bld: 103 mg/dL — ABNORMAL HIGH (ref 70–99)
Potassium: 4 mmol/L (ref 3.5–5.1)
Sodium: 141 mmol/L (ref 135–145)
Total Bilirubin: 0.5 mg/dL (ref 0.3–1.2)
Total Protein: 7.1 g/dL (ref 6.5–8.1)

## 2021-12-16 ENCOUNTER — Ambulatory Visit (INDEPENDENT_AMBULATORY_CARE_PROVIDER_SITE_OTHER): Payer: Medicare Other

## 2021-12-16 DIAGNOSIS — I639 Cerebral infarction, unspecified: Secondary | ICD-10-CM | POA: Diagnosis not present

## 2021-12-17 LAB — CUP PACEART REMOTE DEVICE CHECK
Date Time Interrogation Session: 20230526232037
Implantable Pulse Generator Implant Date: 20200220

## 2021-12-18 ENCOUNTER — Telehealth: Payer: Self-pay | Admitting: Hematology

## 2021-12-18 NOTE — Telephone Encounter (Signed)
Scheduled follow-up appointment per 5/26 los. Patient is aware. Mailed calendar.

## 2021-12-18 NOTE — Progress Notes (Signed)
HEMATOLOGY/ONCOLOGY CLINIC NOTE  Date of Service: .12/12/2021  Patient Care Team: Dorena Dew, FNP as PCP - General (Family Medicine) Werner Lean, MD as PCP - Cardiology (Cardiology) Laretta Alstrom Argie Ramming, RN as [facility] Management  CHIEF COMPLAINTS/PURPOSE OF CONSULTATION:  Interval follow-up for surveillance of lung cancer   DIAGNOSIS 1. Right upper lobe T1b, N0, M0 primary lung adenocarcinoma diagnosed in December 2012. She was deemed not to be a surgical candidate. Treated by radiation oncology with SBRT at [facility]. Completed treatment in March 2013 and has been monitored since with no evidence of recurrence based on PET/CT scan done on 05/20/2015.  2.  Right anterior chest wall slowly growing masslike lesion in the right pectoralis minor muscle. Unclear etiology. Biopsy was done on 12/14/2013 at the [name]. FNA showed skeletal muscles, macrophages and spindle cells some with cytologic atypia. Needle core biopsy showed soft tissue and skeletal muscle with extensive necrosis; rare spindle cells with cytologic atypia. Treatment effect causing reactive atypia cannot be ruled out. Clinical correlation was advised to ensure the sample is representative. PET/CT scan done here on 05/20/2015 shows no change in metabolic activity or size of the medial right chest wall mass suggesting benign etiology.  INTERVAL HISTORY  Pamela [name] is here for her 1 year follow-up for continued surveillance of lung cancer. She notes she has been doing well.  Breathing has been stable.  No new shortness of breath. No new lumps or bumps. She has continued to gain weight and has gained about 8 pounds in the last 2 months. Still remains off oxygen.  CT chest without contrast on 11/20/2021 Shows no evidence of local recurrence of lung cancer or other new suspicious pulmonary nodules or masses. Labs done today were discussed in detail with the  patient  MEDICAL HISTORY:  Past Medical History:  Diagnosis Date   Arthritis    Asthma    Cancer ([place])    lung, adenocarcinoma right lung 2012   CHF (congestive heart failure) (HCC)    Preserved EF   Congenital single kidney    With chronic kidney disease   COPD (chronic obstructive pulmonary disease) ([place])    Gout    Hypertension    Lung cancer ([place]) 2012   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 Patient notes it was consistent with sarcoidosis but actual pathology results not available.   Oxygen deficiency    Sarcoidosis    Sickle cell trait ([place])    Sleep apnea    CKD ([place] 11/21/2015 showed no urinary tract abnormalities.) Gout on Ulor[place] - tea as a trigger.  SURGICAL HISTORY: Past Surgical History:  Procedure Laterality Date   LOOP RECORDER INSERTION N/A 09/08/2018   Procedure: LOOP RECORDER INSERTION;  Surgeon: Evans Lance, MD;  Location: [facility];  Service: Cardiovascular;  Laterality: N/A;   LUNG BIOPSY     TEE WITHOUT CARDIOVERSION N/A 09/08/2018   Procedure: TRANSESOPHAGEAL ECHOCARDIOGRAM (TEE);  Surgeon: Josue Hector, MD;  Location: [facility] ENDOSCOPY;  Service: Cardiovascular;  Laterality: N/A;  with loop   TUBAL LIGATION     VEIN LIGATION AND STRIPPING      SOCIAL HISTORY: Social History   Socioeconomic History   Marital status: Divorced    Spouse name: Not  on file   Number of children: 2   Years of education: Not on file   Highest education level: Not on file  Occupational History   Not on file  Tobacco Use   Smoking status: Never   Smokeless tobacco: Never  Vaping Use   Vaping Use: Never used  Substance and Sexual Activity   Alcohol use: No    Alcohol/week: 0.0 standard drinks   Drug use: No   Sexual activity: Not Currently  Other Topics Concern   Not on file   Social History Narrative   Lives with son.  One son is deceased.     Social Determinants of Health   Financial Resource Strain: Not on file  Food Insecurity: Not on file  Transportation Needs: Not on file  Physical Activity: Not on file  Stress: Not on file  Social Connections: Not on file  Intimate Partner Violence: Not on file    FAMILY HISTORY: Family History  Problem Relation Age of Onset   Hypertension Mother    Renal Disease Mother    Cancer Father        stomach   Heart disease Father        No details   Cancer Brother    Diabetes Brother    Cervical cancer Sister    Diabetes Sister    Multiple myeloma Sister     ALLERGIES:  is allergic to sulfa antibiotics.  MEDICATIONS:  Current Outpatient Medications  Medication Sig Dispense Refill   acetaminophen (TYLENOL) 500 MG tablet Take 1,000 mg by mouth every 6 (six) hours as needed for mild pain.      albuterol (PROAIR HFA) 108 (90 Base) MCG/ACT inhaler Inhale 2 puffs into the lungs every 6 (six) hours as needed for wheezing or shortness of breath. 8 g 3   albuterol (PROVENTIL) (2.5 MG/3ML) 0.083% nebulizer solution USE 1 VIAL IN NEBULIZER EVERY 6 HOURS AS NEEDED FOR WHEEZING FOR SHORTNESS OF BREATH 300 mL 2   aspirin EC 81 MG tablet Take 1 tablet (81 mg total) by mouth daily. Swallow whole. 30 tablet 11   atorvastatin (LIPITOR) 40 MG tablet Take 1 tablet by mouth once daily 90 tablet 0   calcitRIOL (ROCALTROL) 0.25 MCG capsule Take 1 capsule (0.25 mcg total) by mouth daily. 90 capsule 0   docusate sodium (COLACE) 100 MG capsule Take 1 capsule (100 mg total) by mouth 2 (two) times daily. 60 capsule 0   febuxostat (ULORIC) 40 MG tablet Take 1 tablet (40 mg total) by mouth daily. 90 tablet 1   Fluticasone-Salmeterol 55-14 MCG/ACT AEPB Take 2 puffs first thing in am and then another 2 puffs about 12 hours later.     furosemide (LASIX) 40 MG tablet Take 1 tablet (40 mg total) by mouth 2 (two) times daily. (Patient taking  differently: Take 40 mg by mouth 3 (three) times daily as needed.) 60 tablet 2   magnesium oxide (MAG-OX) 400 (241.3 Mg) MG tablet Take 1 tablet (400 mg total) by mouth daily. 30 tablet 0   montelukast (SINGULAIR) 10 MG tablet TAKE 1 TABLET BY MOUTH AT BEDTIME 90 tablet 0   potassium chloride SA (KLOR-CON M) 20 MEQ tablet TAKE 1  BY MOUTH ONCE DAILY 90 tablet 1   famotidine (PEPCID) 20 MG tablet TAKE 1 TABLET BY MOUTH ONCE DAILY AFTER SUPPER (Patient not taking: Reported on 12/12/2021) 30 tablet 6   No current facility-administered medications for this visit.    REVIEW OF SYSTEMS:  10 Point review of Systems was done is negative except as noted above.  PHYSICAL EXAMINATION:  ECOG PERFORMANCE STATUS: 2-3  . Vitals:   12/12/21 0918  BP: (!) 136/58  Pulse: 72  Resp: 20  Temp: 97.8 F (36.6 C)  SpO2: 97%   Filed Weights   12/12/21 0918  Weight: (!) 359 lb 8 oz (163.1 kg)   .Body mass index is 58.02 kg/m.   . Wt Readings from Last 3 Encounters:  12/12/21 (!) 359 lb 8 oz (163.1 kg)  11/12/21 (!) 354 lb 9.6 oz (160.8 kg)  10/21/21 (!) 351 lb 2 oz (159.3 kg)  . GENERAL:alert, in no acute distress and comfortable SKIN: no acute rashes, no significant lesions EYES: conjunctiva are pink and non-injected, sclera anicteric OROPHARYNX: MMM, no exudates, no oropharyngeal erythema or ulceration NECK: supple, no JVD LYMPH:  no palpable lymphadenopathy in the cervical, axillary or inguinal regions LUNGS: clear to auscultation b/l with normal respiratory effort HEART: regular rate & rhythm ABDOMEN:  normoactive bowel sounds , non tender Extremity: no pedal edema PSYCH: alert & oriented x 3 with fluent speech NEURO: no focal motor/sensory deficits   LABORATORY DATA:  I have reviewed the data as listed     Latest Ref Rng & Units 12/12/2021    8:48 AM 12/13/2020    9:54 AM 12/20/2019    5:26 AM  CBC  WBC 4.0 - 10.5 K/uL 5.6   4.7   6.4    Hemoglobin 12.0 - 15.0 g/dL 11.2   11.3    12.0    Hematocrit 36.0 - 46.0 % 35.5   36.2   38.3    Platelets 150 - 400 K/uL 150   157   171     . CBC    Component Value Date/Time   WBC 5.6 12/12/2021 0848   WBC 4.7 12/13/2020 0954   RBC 5.06 12/12/2021 0848   HGB 11.2 (L) 12/12/2021 0848   HGB 11.0 (L) 08/29/2019 0941   HGB 11.8 05/31/2017 1050   HCT 35.5 (L) 12/12/2021 0848   HCT 35.4 08/29/2019 0941   HCT 37.5 05/31/2017 1050   PLT 150 12/12/2021 0848   PLT 195 08/29/2019 0941   MCV 70.2 (L) 12/12/2021 0848   MCV 72 (L) 08/29/2019 0941   MCV 71.2 (L) 05/31/2017 1050   MCH 22.1 (L) 12/12/2021 0848   MCHC 31.5 12/12/2021 0848   RDW 15.3 12/12/2021 0848   RDW 16.2 (H) 08/29/2019 0941   RDW 15.1 (H) 05/31/2017 1050   LYMPHSABS 1.3 12/12/2021 0848   LYMPHSABS 1.0 08/29/2019 0941   LYMPHSABS 1.2 05/31/2017 1050   MONOABS 0.5 12/12/2021 0848   MONOABS 0.4 05/31/2017 1050   EOSABS 0.7 (H) 12/12/2021 0848   EOSABS 1.0 (H) 08/29/2019 0941   BASOSABS 0.0 12/12/2021 0848   BASOSABS 0.0 08/29/2019 0941   BASOSABS 0.0 05/31/2017 1050     .    Latest Ref Rng & Units 12/12/2021    8:48 AM 09/11/2021   10:51 AM 07/15/2021   10:55 AM  CMP  Glucose 70 - 99 mg/dL 103   82   83    BUN 8 - 23 mg/dL 41   31   40    Creatinine 0.44 - 1.00 mg/dL 1.89   1.67   1.83    Sodium 135 - 145 mmol/L 141   143   143    Potassium 3.5 - 5.1 mmol/L 4.0   4.3   3.9  Chloride 98 - 111 mmol/L 101   101   99    CO2 22 - 32 mmol/L 32   27   26    Calcium 8.9 - 10.3 mg/dL 9.2   8.6   8.6    Total Protein 6.5 - 8.1 g/dL 7.1    6.6    Total Bilirubin 0.3 - 1.2 mg/dL 0.5    0.3    Alkaline Phos 38 - 126 U/L 166    206    AST 15 - 41 U/L 16    40    ALT 0 - 44 U/L 9    32      RADIOGRAPHIC STUDIES: I have personally reviewed the radiological images as listed and agreed with the findings in the report. CT Chest Wo Contrast  Result Date: 11/20/2021 CLINICAL DATA:  History of non-small cell lung cancer, surveillance. * Tracking Code: BO *  EXAM: CT CHEST WITHOUT CONTRAST TECHNIQUE: Multidetector CT imaging of the chest was performed following the standard protocol without IV contrast. RADIATION DOSE REDUCTION: This exam was performed according to the departmental dose-optimization program which includes automated exposure control, adjustment of the mA and/or kV according to patient size and/or use of iterative reconstruction technique. COMPARISON:  Multiple priors including most recent CT Nov 20, 2020 FINDINGS: Cardiovascular: Aortic atherosclerosis without aneurysmal dilation. Mild cardiomegaly. Implanted cardiac loop recording device. No significant pericardial effusion/thickening. Mediastinum/Nodes: No discrete thyroid nodule. No pathologically enlarged mediastinal, hilar or axillary lymph nodes, noting limited sensitivity for the detection of hilar adenopathy on this noncontrast study. Small hiatal hernia. Lungs/Pleura: Similar post treatment fibrosis, consolidation and volume loss of the perihilar right upper lobe for instance on image 60/5. Stable 3-4 mm pulmonary nodule in the posterior right upper lobe on image 54/5. Previously identified 3 mm pulmonary nodule in the left lung base is not well seen on today's examination. No new suspicious pulmonary nodules or masses. No pleural effusion. No pneumothorax. Upper Abdomen: No acute abnormality in the upper abdomen. Colonic diverticulosis without findings of acute diverticulitis. Musculoskeletal: Soft tissue mass of the right pectoralis major and minor musculature appears similar to prior measuring 5.5 x 2.1 cm on image 67/5 previously 5.0 x 3.1 cm. Multilevel degenerative changes spine with bridging anterior vertebral osteophytes. No aggressive lytic or blastic lesion of bone. IMPRESSION: 1. Similar post treatment fibrosis, consolidation and volume loss of the perihilar right upper lobe no evidence of local recurrence. 2. Stable tiny right upper lobe pulmonary nodule almost certainly reflecting a  benign/incidental finding. No new suspicious pulmonary nodules or masses. 3. Similar 5.5 x 2.1 cm soft tissue mass of the right pectoralis major and minor musculature, again of uncertain clinical significance. 4.  Aortic Atherosclerosis (ICD10-I70.0). Electronically Signed   By: Dahlia Bailiff M.D.   On: 11/20/2021 15:57   CUP PACEART REMOTE DEVICE CHECK  Result Date: 12/17/2021 ILR summary report received. Battery status OK. Normal device function. No new symptom, tachy, brady, or pause episodes. No new AF episodes. Monthly summary reports and ROV/PRN LA     ASSESSMENT & PLAN:   71 y.o. African-American female with   #1 History of right upper lobe T1b, N0, M0 primary lung adenocarcinoma most in December 2012. She was deemed not to be a surgical candidate. Treated by radiation oncology with [facility] at [facility]. Completed treatment in March 2013 and has been monitored since with no evidence of recurrence. no evidence of recurrence based on PET/CT scan done on 05/20/2015. On clinical  visit today the patient has no new change in her breathing and no new focal symptoms.  Hemoglobin is stable.   CT chest 05/26/2016 shows no evidence of local recurrence of cancer or metastatic disease. CT Chest, 05/31/2017 shows no evidence of local recurrence of cancer or metastatic disease.   #2 Right anterior chest wall slowly growing masslike lesion in the right pectoralis minor muscle. Unclear etiology. FNA showed skeletal muscles, macrophages and spindle cells some with cytologic atypia. Needle core biopsy showed soft tissue and skeletal muscle with extensive necrosis; rare spindle cells with cytologic atypia. Treatment effect causing reactive atypia cannot be ruled out.  PET/CT scan done here on 05/20/2015 shows no change in metabolic activity or size of the medial right chest wall mass suggesting benign etiology. Could certainly be related to her sarcoidosis . Previously discussed with the patient the  option of biopsying the mass for a more definitive diagnosis versus monitoring it. She chooses to monitor it at this time given that imaging findings suggest a likely benign etiology that hasnt changed over the last 18 months. CT chest 05/26/2016 shows that this lesion is stable compared to previous imaging. -CT Chest, 05/31/2017 shows that this lesion is stable. No locally recurrent or metastatic disease.   PLAN:  -Discussed lab results from today which show stable CBC and CMP. -Discussed CT chest without contrast done on 11/20/2021.  No evidence of local recurrence of lung cancer.  No evidence of new suspicious pulmonary nodules or masses.  Stable soft tissue mass of the right pectoralis major and minor muscle. -Continue follow-up with pulmonology for treatment of sarcoidosis. -No indication clinically or radiographically for progression/recurrence of lung cancer. -Patient recommended continued focus on lifestyle modification to address her weight with her primary care physician. -Continue f/u with PCP, nephrologist, cardiologist, pulmonologist. -Will see back in 12 months with labs.    #3 history of extensive sarcoidosis involving lymphadenopathy in the chest, abdomen as well as spleen and possibly liver .previously treated with steroids by Dr. Bartholome Bill, pulmonary at [facility] .has recently established care with Dr. Melvyn Novas for her pulmonary and sarcoidosis management.   Plan -Continue followup with Dr. Melvyn Novas For further management   #4 history of sickle cell trait. Hemoglobin electrophoresis is not available. Hgb at 11.4 today.  Microcytosis likely from chronic disease from her sarcoidosis vs possible alpha thal trait. Ferritin levels WNL  Plan   -No indication for additional iron replacement at this time.  FOLLOW UP: RTC with Dr Irene Limbo with labs in 12 months CT chest in 11 months    The total time spent in the appointment was 20 minutes*.  All of the patient's questions were  answered with apparent satisfaction. The patient knows to call the clinic with any problems, questions or concerns.   Sullivan Lone MD MS AAHIVMS [facility] Hematology/Oncology Physician [facility]  .*Total Encounter Time as defined by the Centers for Medicare and Medicaid Services includes, in addition to the face-to-face time of a patient visit (documented in the note above) non-face-to-face time: obtaining and reviewing outside history, ordering and reviewing medications, tests or procedures, care coordination (communications with other health care professionals or caregivers) and documentation in the medical record.

## 2021-12-25 ENCOUNTER — Telehealth: Payer: Self-pay

## 2021-12-26 ENCOUNTER — Telehealth: Payer: Self-pay | Admitting: Family Medicine

## 2021-12-26 NOTE — Telephone Encounter (Signed)
Called patient and she prefers a Monday appt. She will see Tewana on Monday afternoon.

## 2021-12-29 ENCOUNTER — Ambulatory Visit (INDEPENDENT_AMBULATORY_CARE_PROVIDER_SITE_OTHER): Payer: Medicare Other | Admitting: Nurse Practitioner

## 2021-12-29 ENCOUNTER — Encounter: Payer: Self-pay | Admitting: Nurse Practitioner

## 2021-12-29 VITALS — BP 132/45 | HR 67 | Temp 97.3°F | Ht 66.0 in | Wt 354.6 lb

## 2021-12-29 DIAGNOSIS — J040 Acute laryngitis: Secondary | ICD-10-CM

## 2021-12-29 DIAGNOSIS — J029 Acute pharyngitis, unspecified: Secondary | ICD-10-CM

## 2021-12-29 LAB — POCT RAPID STREP A (OFFICE): Rapid Strep A Screen: NEGATIVE

## 2021-12-29 MED ORDER — METHYLPREDNISOLONE SODIUM SUCC 40 MG IJ SOLR
40.0000 mg | Freq: Once | INTRAMUSCULAR | Status: AC
  Administered 2021-12-29: 40 mg via INTRAMUSCULAR

## 2021-12-29 NOTE — Patient Instructions (Signed)
You were seen today in the [facility] for sore throat. Labs were collected, results will be available via MyChart or, if abnormal, you will be contacted by clinic staff.  Please follow up as needed

## 2021-12-29 NOTE — Progress Notes (Unsigned)
[place], [place]  [address] Phone:  [phone]   Fax:  [phone] Subjective:   Patient ID: Pamela [name], female    DOB: 1950-10-23, 71 y.o.   MRN: [id number]  Chief Complaint  Patient presents with   Sore Throat    Patient has been experiencing sore throat for a week (since last Saturday) and has lost her voice since last Monday. Patient says that the pain is more so itchy/scratchy and has tried a home remedy of salt water.    Sore Throat    Pamela [name] 71 y.o. female  has a past medical history of Arthritis, Asthma, Cancer ([place]), CHF (congestive heart failure) ([place]), Congenital single kidney, COPD (chronic obstructive pulmonary disease) ([place]), Gout, Hypertension, Lung cancer ([place]) (2012), Mass of chest wall, right, Oxygen deficiency, Sarcoidosis, Sickle cell trait ([place]), and Sleep apnea. To the [facility] for throat pain.  States that she has had throat pain x 7-10 days, rates pain 3/10 and describes as soreness. States that she has been progressively losing her voice. Has been doing salt water rinses, drinking tea with honey and using cough drops with moderate improvement in symptoms.   Past Medical History:  Diagnosis Date   Arthritis    Asthma    Cancer ([place])    lung, adenocarcinoma right lung 2012   CHF (congestive heart failure) (HCC)    Preserved EF   Congenital single kidney    With chronic kidney disease   COPD (chronic obstructive pulmonary disease) ([place])    Gout    Hypertension    Lung cancer ([place]) 2012   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 Patient notes it was consistent with sarcoidosis but actual pathology results not available.   Oxygen deficiency    Sarcoidosis    Sickle cell trait ([place])    Sleep apnea     Past Surgical  History:  Procedure Laterality Date   LOOP RECORDER INSERTION N/A 09/08/2018   Procedure: LOOP RECORDER INSERTION;  Surgeon: Evans Lance, MD;  Location: [facility];  Service: Cardiovascular;  Laterality: N/A;   LUNG BIOPSY     TEE WITHOUT CARDIOVERSION N/A 09/08/2018   Procedure: TRANSESOPHAGEAL ECHOCARDIOGRAM (TEE);  Surgeon: Josue Hector, MD;  Location: [facility];  Service: Cardiovascular;  Laterality: N/A;  with loop   TUBAL LIGATION     VEIN LIGATION AND STRIPPING      Family History  Problem Relation Age of Onset   Hypertension Mother    Renal Disease Mother    Cancer Father        stomach   Heart disease Father        No details   Cancer Brother    Diabetes Brother    Cervical cancer Sister    Diabetes Sister    Multiple myeloma Sister     Social History   Socioeconomic History   Marital status: Divorced    Spouse name: Not on file   Number of children: 2   Years of education: Not on file   Highest education level: Not on file  Occupational History   Not on file  Tobacco Use   Smoking status: Never   Smokeless tobacco: Never  Vaping Use   Vaping Use: Never used  Substance  and Sexual Activity   Alcohol use: No    Alcohol/week: 0.0 standard drinks of alcohol   Drug use: No   Sexual activity: Not Currently  Other Topics Concern   Not on file  Social History Narrative   Lives with son.  One son is deceased.     Social Determinants of Health   Financial Resource Strain: Not on file  Food Insecurity: Food Insecurity Present (12/03/2020)   Hunger Vital Sign    Worried About Running Out of Food in the Last Year: Sometimes true    Ran Out of Food in the Last Year: Sometimes true  Transportation Needs: No Transportation Needs (12/03/2020)   PRAPARE - Hydrologist (Medical): No    Lack of Transportation (Non-Medical): No  Physical Activity: Not on file  Stress: Not on file  Social Connections: Not on file  Intimate  Partner Violence: Not on file    Outpatient Medications Prior to Visit  Medication Sig Dispense Refill   acetaminophen (TYLENOL) 500 MG tablet Take 1,000 mg by mouth every 6 (six) hours as needed for mild pain.      albuterol (PROAIR HFA) 108 (90 Base) MCG/ACT inhaler Inhale 2 puffs into the lungs every 6 (six) hours as needed for wheezing or shortness of breath. 8 g 3   albuterol (PROVENTIL) (2.5 MG/3ML) 0.083% nebulizer solution USE 1 VIAL IN NEBULIZER EVERY 6 HOURS AS NEEDED FOR WHEEZING FOR SHORTNESS OF BREATH 300 mL 2   aspirin EC 81 MG tablet Take 1 tablet (81 mg total) by mouth daily. Swallow whole. 30 tablet 11   atorvastatin (LIPITOR) 40 MG tablet Take 1 tablet by mouth once daily 90 tablet 0   calcitRIOL (ROCALTROL) 0.25 MCG capsule Take 1 capsule (0.25 mcg total) by mouth daily. 90 capsule 0   docusate sodium (COLACE) 100 MG capsule Take 1 capsule (100 mg total) by mouth 2 (two) times daily. 60 capsule 0   febuxostat (ULORIC) 40 MG tablet Take 1 tablet (40 mg total) by mouth daily. 90 tablet 1   Fluticasone-Salmeterol 55-14 MCG/ACT AEPB Take 2 puffs first thing in am and then another 2 puffs about 12 hours later.     furosemide (LASIX) 40 MG tablet Take 1 tablet (40 mg total) by mouth 2 (two) times daily. (Patient taking differently: Take 40 mg by mouth 3 (three) times daily as needed.) 60 tablet 2   magnesium oxide (MAG-OX) 400 (241.3 Mg) MG tablet Take 1 tablet (400 mg total) by mouth daily. 30 tablet 0   montelukast (SINGULAIR) 10 MG tablet TAKE 1 TABLET BY MOUTH AT BEDTIME 90 tablet 0   potassium chloride SA (KLOR-CON M) 20 MEQ tablet TAKE 1  BY MOUTH ONCE DAILY 90 tablet 1   famotidine (PEPCID) 20 MG tablet TAKE 1 TABLET BY MOUTH ONCE DAILY AFTER SUPPER (Patient not taking: Reported on 12/12/2021) 30 tablet 6   No facility-administered medications prior to visit.    Allergies  Allergen Reactions   Sulfa Antibiotics Hives and Rash    ROS     Objective:    Physical  Exam HENT:     Mouth/Throat:     Mouth: Mucous membranes are moist. No oral lesions.     Pharynx: Posterior oropharyngeal erythema present. No pharyngeal swelling, oropharyngeal exudate or uvula swelling.     Tonsils: No tonsillar exudate or tonsillar abscesses. 0 on the right. 0 on the left.     BP (!) 132/45 (BP Location: Left  Arm, Patient Position: Sitting, Cuff Size: Normal)   Pulse 67   Temp (!) 97.3 F (36.3 C)   Ht '5\' 6"'  (1.676 m)   Wt (!) 354 lb 9.6 oz (160.8 kg)   SpO2 97%   BMI 57.23 kg/m  Wt Readings from Last 3 Encounters:  12/29/21 (!) 354 lb 9.6 oz (160.8 kg)  12/12/21 (!) 359 lb 8 oz (163.1 kg)  11/12/21 (!) 354 lb 9.6 oz (160.8 kg)    Immunization History  Administered Date(s) Administered   Influenza,inj,Quad PF,6+ Mos 04/13/2014, 04/16/2015, 04/03/2016, 05/19/2017, 06/15/2018, 05/14/2019   PFIZER(Purple Top)SARS-COV-2 Vaccination 10/14/2019, 11/08/2019, 07/08/2020, 03/06/2021   Pneumococcal Conjugate-13 04/03/2016   Pneumococcal Polysaccharide-23 04/13/2014, 12/26/2019   Tdap 01/02/2015   Zoster, Live 05/21/2014    Diabetic Foot Exam - Simple   No data filed     Lab Results  Component Value Date   TSH 3.934 05/21/2014   Lab Results  Component Value Date   WBC 5.6 12/12/2021   HGB 11.2 (L) 12/12/2021   HCT 35.5 (L) 12/12/2021   MCV 70.2 (L) 12/12/2021   PLT 150 12/12/2021   Lab Results  Component Value Date   NA 141 12/12/2021   K 4.0 12/12/2021   CHLORIDE 103 05/31/2017   CO2 32 12/12/2021   GLUCOSE 103 (H) 12/12/2021   BUN 41 (H) 12/12/2021   CREATININE 1.89 (H) 12/12/2021   BILITOT 0.5 12/12/2021   ALKPHOS 166 (H) 12/12/2021   AST 16 12/12/2021   ALT 9 12/12/2021   PROT 7.1 12/12/2021   ALBUMIN 3.8 12/12/2021   CALCIUM 9.2 12/12/2021   ANIONGAP 8 12/12/2021   EGFR 33 (L) 09/11/2021   Lab Results  Component Value Date   CHOL 130 03/01/2019   CHOL 157 09/07/2018   CHOL 171 04/13/2014   Lab Results  Component Value Date    HDL 48 03/01/2019   HDL 41 09/07/2018   HDL 59 04/13/2014   Lab Results  Component Value Date   LDLCALC 61 03/01/2019   LDLCALC 95 09/07/2018   LDLCALC 89 04/13/2014   Lab Results  Component Value Date   TRIG 105 03/01/2019   TRIG 104 09/07/2018   TRIG 116 04/13/2014   Lab Results  Component Value Date   CHOLHDL 2.7 03/01/2019   CHOLHDL 3.8 09/07/2018   CHOLHDL 2.9 04/13/2014   Lab Results  Component Value Date   HGBA1C 5.5 10/21/2021   HGBA1C 5.5 10/21/2021   HGBA1C 5.5 (A) 10/21/2021   HGBA1C 5.5 10/21/2021       Assessment & Plan:   Problem List Items Addressed This Visit   None   I have discontinued Pamela [name]'s famotidine. I am also having her maintain her acetaminophen, docusate sodium, magnesium oxide, albuterol, aspirin EC, montelukast, potassium chloride SA, atorvastatin, febuxostat, albuterol, furosemide, Fluticasone-Salmeterol, and calcitRIOL.  No orders of the defined types were placed in this encounter.    Teena Dunk, NP

## 2021-12-30 NOTE — Telephone Encounter (Signed)
No additional notes needed  

## 2022-01-01 ENCOUNTER — Other Ambulatory Visit: Payer: Self-pay | Admitting: *Deleted

## 2022-01-01 NOTE — Progress Notes (Addendum)
Carelink Summary Report / Loop Recorder 

## 2022-01-01 NOTE — Patient Outreach (Addendum)
[name]) Care Management  01/01/2022  Pamela [name] 1951/06/14 [id number]  Multiple attempts to establish contact with patient without success. No response from letter mailed to patient. Case is being closed at this time.   Plan: RN Health Coach will close case and will send PCP and patient a case closed letter.   Emelia Loron RN, BSN [place] [phone] [name]

## 2022-01-16 ENCOUNTER — Telehealth: Payer: Self-pay

## 2022-01-16 ENCOUNTER — Ambulatory Visit: Payer: Self-pay | Admitting: *Deleted

## 2022-01-19 ENCOUNTER — Ambulatory Visit (INDEPENDENT_AMBULATORY_CARE_PROVIDER_SITE_OTHER): Payer: Medicare Other

## 2022-01-19 DIAGNOSIS — I639 Cerebral infarction, unspecified: Secondary | ICD-10-CM | POA: Diagnosis not present

## 2022-01-20 LAB — CUP PACEART REMOTE DEVICE CHECK
Date Time Interrogation Session: 20230702230855
Implantable Pulse Generator Implant Date: 20200220

## 2022-01-21 ENCOUNTER — Telehealth: Payer: Self-pay | Admitting: Family Medicine

## 2022-01-21 NOTE — Telephone Encounter (Signed)
Antibiotics refill request

## 2022-01-22 ENCOUNTER — Other Ambulatory Visit: Payer: Self-pay | Admitting: Family Medicine

## 2022-01-23 ENCOUNTER — Other Ambulatory Visit: Payer: Self-pay | Admitting: Family Medicine

## 2022-01-23 ENCOUNTER — Telehealth: Payer: Self-pay | Admitting: Family Medicine

## 2022-01-23 DIAGNOSIS — R0989 Other specified symptoms and signs involving the circulatory and respiratory systems: Secondary | ICD-10-CM

## 2022-01-23 MED ORDER — AZITHROMYCIN 250 MG PO TABS: ORAL_TABLET | ORAL | 0 refills | Status: AC

## 2022-01-23 NOTE — Telephone Encounter (Signed)
Pamela [name] is a 71 year old female with a medical history significant for COPD, chronic respiratory failure with hypoxia, asthma, essential hypertension, CHF, aortic atherosclerosis, sarcoidosis, and obesity, with complaints of upper respiratory symptoms for the past several weeks.  Patient was evaluated in office on 12/29/2021 and was administered steroid.  She says that symptoms improved for a few days but came back.  She endorses persistent productive cough, wheezing, and shortness of breath with exertion.  We will add azithromycin 500 mg day 1, 250 mg on days 2 through 5.  Patient will follow-up in clinic on 02/03/2022.  [name]  APRN, MSN, FNP-C Patient [facility] [address] [place], [place] [address] [phone]

## 2022-01-23 NOTE — Progress Notes (Signed)
Meds ordered this encounter  Medications   azithromycin (ZITHROMAX) 250 MG tablet    Sig: Take 2 tablets on day 1, then 1 tablet daily on days 2 through 5    Dispense:  6 tablet    Refill:  0    Order Specific Question:   Supervising Provider    Answer:   Tresa Garter [[id number]]   [place], MSN, FNP-C Patient [facility] [address] [place], [place] [address] [phone]

## 2022-01-27 NOTE — Telephone Encounter (Signed)
error 

## 2022-01-29 ENCOUNTER — Ambulatory Visit (INDEPENDENT_AMBULATORY_CARE_PROVIDER_SITE_OTHER): Payer: Medicare Other | Admitting: Bariatrics

## 2022-01-29 ENCOUNTER — Encounter (INDEPENDENT_AMBULATORY_CARE_PROVIDER_SITE_OTHER): Payer: Self-pay | Admitting: Bariatrics

## 2022-01-29 VITALS — BP 139/83 | HR 78 | Temp 97.9°F | Ht 63.0 in | Wt 344.0 lb

## 2022-01-29 DIAGNOSIS — R7303 Prediabetes: Secondary | ICD-10-CM

## 2022-01-29 DIAGNOSIS — Z1331 Encounter for screening for depression: Secondary | ICD-10-CM

## 2022-01-29 DIAGNOSIS — E7849 Other hyperlipidemia: Secondary | ICD-10-CM | POA: Diagnosis not present

## 2022-01-29 DIAGNOSIS — Z6841 Body Mass Index (BMI) 40.0 and over, adult: Secondary | ICD-10-CM

## 2022-01-29 DIAGNOSIS — I10 Essential (primary) hypertension: Secondary | ICD-10-CM

## 2022-01-29 DIAGNOSIS — G4733 Obstructive sleep apnea (adult) (pediatric): Secondary | ICD-10-CM

## 2022-01-29 DIAGNOSIS — D638 Anemia in other chronic diseases classified elsewhere: Secondary | ICD-10-CM

## 2022-01-29 DIAGNOSIS — J9610 Chronic respiratory failure, unspecified whether with hypoxia or hypercapnia: Secondary | ICD-10-CM | POA: Diagnosis not present

## 2022-01-29 DIAGNOSIS — R5383 Other fatigue: Secondary | ICD-10-CM | POA: Diagnosis not present

## 2022-01-29 DIAGNOSIS — E559 Vitamin D deficiency, unspecified: Secondary | ICD-10-CM

## 2022-01-29 DIAGNOSIS — E538 Deficiency of other specified B group vitamins: Secondary | ICD-10-CM

## 2022-01-29 DIAGNOSIS — R0602 Shortness of breath: Secondary | ICD-10-CM | POA: Diagnosis not present

## 2022-01-29 DIAGNOSIS — Z0000 Encounter for general adult medical examination without abnormal findings: Secondary | ICD-10-CM

## 2022-01-30 LAB — COMPREHENSIVE METABOLIC PANEL
ALT: 9 IU/L (ref 0–32)
AST: 17 IU/L (ref 0–40)
Albumin/Globulin Ratio: 1.4 (ref 1.2–2.2)
Albumin: 4 g/dL (ref 3.9–4.9)
Alkaline Phosphatase: 160 IU/L — ABNORMAL HIGH (ref 44–121)
BUN/Creatinine Ratio: 14 (ref 12–28)
BUN: 23 mg/dL (ref 8–27)
Bilirubin Total: 0.4 mg/dL (ref 0.0–1.2)
CO2: 26 mmol/L (ref 20–29)
Calcium: 9.4 mg/dL (ref 8.7–10.3)
Chloride: 99 mmol/L (ref 96–106)
Creatinine, Ser: 1.62 mg/dL — ABNORMAL HIGH (ref 0.57–1.00)
Globulin, Total: 2.9 g/dL (ref 1.5–4.5)
Glucose: 79 mg/dL (ref 70–99)
Potassium: 4.1 mmol/L (ref 3.5–5.2)
Sodium: 142 mmol/L (ref 134–144)
Total Protein: 6.9 g/dL (ref 6.0–8.5)
eGFR: 34 mL/min/{1.73_m2} — ABNORMAL LOW (ref >59)

## 2022-01-30 LAB — HEMOGLOBIN A1C
Est. average glucose Bld gHb Est-mCnc: 126 mg/dL
Hgb A1c MFr Bld: 6 % — ABNORMAL HIGH (ref 4.8–5.6)

## 2022-01-30 LAB — ANEMIA PANEL
Ferritin: 298 ng/mL — ABNORMAL HIGH (ref 15–150)
Folate, Hemolysate: 344 ng/mL
Folate, RBC: 927 ng/mL (ref >498)
Hematocrit: 37.1 % (ref 34.0–46.6)
Iron Saturation: 20 % (ref 15–55)
Iron: 51 ug/dL (ref 27–139)
Retic Ct Pct: 1.5 % (ref 0.6–2.6)
Total Iron Binding Capacity: 255 ug/dL (ref 250–450)
UIBC: 204 ug/dL (ref 118–369)
Vitamin B-12: 830 pg/mL (ref 232–1245)

## 2022-01-30 LAB — INSULIN, RANDOM: INSULIN: 5.8 u[IU]/mL (ref 2.6–24.9)

## 2022-01-30 LAB — LIPID PANEL WITH LDL/HDL RATIO
Cholesterol, Total: 166 mg/dL (ref 100–199)
HDL: 55 mg/dL (ref >39)
LDL Chol Calc (NIH): 96 mg/dL (ref 0–99)
LDL/HDL Ratio: 1.7 ratio (ref 0.0–3.2)
Triglycerides: 78 mg/dL (ref 0–149)
VLDL Cholesterol Cal: 15 mg/dL (ref 5–40)

## 2022-01-30 LAB — TSH+T4F+T3FREE
Free T4: 1.12 ng/dL (ref 0.82–1.77)
T3, Free: 2.6 pg/mL (ref 2.0–4.4)
TSH: 1.87 u[IU]/mL (ref 0.450–4.500)

## 2022-01-30 LAB — VITAMIN D 25 HYDROXY (VIT D DEFICIENCY, FRACTURES): Vit D, 25-Hydroxy: 8.3 ng/mL — ABNORMAL LOW (ref 30.0–100.0)

## 2022-02-02 ENCOUNTER — Encounter (INDEPENDENT_AMBULATORY_CARE_PROVIDER_SITE_OTHER): Payer: Self-pay | Admitting: Bariatrics

## 2022-02-02 DIAGNOSIS — E559 Vitamin D deficiency, unspecified: Secondary | ICD-10-CM | POA: Insufficient documentation

## 2022-02-03 ENCOUNTER — Encounter (INDEPENDENT_AMBULATORY_CARE_PROVIDER_SITE_OTHER): Payer: Self-pay | Admitting: Bariatrics

## 2022-02-03 ENCOUNTER — Encounter: Payer: Self-pay | Admitting: Family Medicine

## 2022-02-03 ENCOUNTER — Ambulatory Visit (INDEPENDENT_AMBULATORY_CARE_PROVIDER_SITE_OTHER): Payer: Medicare Other | Admitting: Family Medicine

## 2022-02-03 VITALS — BP 130/66 | HR 70 | Temp 98.0°F | Resp 21 | Ht 63.0 in | Wt 343.8 lb

## 2022-02-03 DIAGNOSIS — J4530 Mild persistent asthma, uncomplicated: Secondary | ICD-10-CM | POA: Diagnosis not present

## 2022-02-03 DIAGNOSIS — J441 Chronic obstructive pulmonary disease with (acute) exacerbation: Secondary | ICD-10-CM

## 2022-02-03 DIAGNOSIS — I639 Cerebral infarction, unspecified: Secondary | ICD-10-CM | POA: Diagnosis not present

## 2022-02-03 DIAGNOSIS — Z8709 Personal history of other diseases of the respiratory system: Secondary | ICD-10-CM | POA: Diagnosis not present

## 2022-02-03 DIAGNOSIS — I10 Essential (primary) hypertension: Secondary | ICD-10-CM

## 2022-02-03 DIAGNOSIS — Z6841 Body Mass Index (BMI) 40.0 and over, adult: Secondary | ICD-10-CM | POA: Diagnosis not present

## 2022-02-03 DIAGNOSIS — R062 Wheezing: Secondary | ICD-10-CM

## 2022-02-03 LAB — POCT URINALYSIS DIP (CLINITEK)
Bilirubin, UA: NEGATIVE
Blood, UA: NEGATIVE
Glucose, UA: NEGATIVE mg/dL
Ketones, POC UA: NEGATIVE mg/dL
Leukocytes, UA: NEGATIVE
Nitrite, UA: NEGATIVE
POC PROTEIN,UA: NEGATIVE
Spec Grav, UA: 1.01 (ref 1.010–1.025)
Urobilinogen, UA: 0.2 E.U./dL
pH, UA: 5.5 (ref 5.0–8.0)

## 2022-02-03 MED ORDER — METHYLPREDNISOLONE SODIUM SUCC 125 MG IJ SOLR
80.0000 mg | Freq: Once | INTRAMUSCULAR | Status: AC
  Administered 2022-02-03: 80 mg via INTRAMUSCULAR

## 2022-02-03 MED ORDER — FLUTICASONE-SALMETEROL 55-14 MCG/ACT IN AEPB: INHALATION_SPRAY | RESPIRATORY_TRACT | 11 refills | Status: DC

## 2022-02-03 MED ORDER — PREDNISONE 10 MG (21) PO TBPK: ORAL_TABLET | ORAL | 0 refills | Status: DC

## 2022-02-03 MED ORDER — ALBUTEROL SULFATE HFA 108 (90 BASE) MCG/ACT IN AERS: 2.0000 | INHALATION_SPRAY | RESPIRATORY_TRACT | 3 refills | Status: DC | PRN

## 2022-02-03 MED ORDER — METHYLPREDNISOLONE SODIUM SUCC 125 MG IJ SOLR: 125.0000 mg | Freq: Once | INTRAMUSCULAR | Status: DC

## 2022-02-03 NOTE — Patient Instructions (Signed)
Asthma Action Plan, Adult An asthma action plan helps you understand how to manage your asthma and what to do when you have an asthma attack. The action plan is a color-coded plan that lists the symptoms that indicate whether or not your condition is under control and what actions to take. If you have symptoms in the green zone, you are doing well. If you have symptoms in the yellow zone, you are having problems. If you have symptoms in the red zone, you need medical care right away. Follow the plan that you and your health care provider develop. Review your plan with your health care provider at each visit. What triggers your asthma? Knowing the things that can trigger an asthma attack or make your asthma symptoms worse is very important. Talk to your health care provider about your asthma triggers and how to avoid them. Record your known asthma triggers here: _______________ What is your personal best peak flow reading? If you use a peak flow meter, determine your personal best reading. Record it here: _______________ Nyoka Cowden zone This zone means that your asthma is under control. You may not have any symptoms while you are in the green zone. This means that you: Have no coughing or wheezing, even while you are working or playing. Sleep through the night. Are breathing well. Have a peak flow reading that is above __________ (80% of your personal best or greater). If you are in the green zone, continue to manage your asthma as directed. Take these medicines every day: Controller medicine and dosage: _______________ Controller medicine and dosage: _______________ Controller medicine and dosage: _______________ Controller medicine and dosage: _______________ Before exercise, use this reliever or rescue medicine: _______________ Call your health care provider if you are using a reliever or rescue medicine more than 2-3 times a week. Yellow zone Symptoms in this zone mean that your condition may  be getting worse. You may have symptoms that interfere with exercise, are noticeably worse after exposure to triggers, or are worse at the first sign of a cold (upper respiratory infection). These may include: Waking from sleep. Coughing, especially at night or first thing in the morning. Mild wheezing. Chest tightness. A peak flow reading that is __________ to __________ (50-79% of your personal best). If you have any of these symptoms: Add the following medicine to the ones that you use daily: Reliever or rescue medicine and dosage: _______________ Additional medicine and dosage: _______________ Call your health care provider if: You remain in the yellow zone for __________ hours. You are using a reliever or rescue medicine more than 2-3 times a week. Red zone Symptoms in this zone mean that you should get medical help right away. You will likely feel distressed and have symptoms at rest that restrict your activity. You are in the red zone if: You are breathing hard and quickly. Your nose opens wide, your ribs show, and your neck muscles become visible when you breathe in. Your lips, fingers, or toes are a bluish color. You have trouble speaking in full sentences. Your peak flow reading is less than __________ (less than 50% of your personal best). Your symptoms do not improve within 15-20 minutes after you use your reliever or rescue medicine (bronchodilator). If you have any of these symptoms: These symptoms represent a serious problem that is an emergency. Do not wait to see if the symptoms will go away. Get medical help right away. Call your local emergency services (911 in the [place] Do not drive yourself  to the hospital. Use your reliever or rescue medicine. Start a nebulizer treatment or take 2-4 puffs from a metered-dose inhaler with a spacer. Repeat this action every 15-20 minutes until help arrives. Where to find more information You can find more information about asthma  from: Centers for Disease Control and Prevention: http://www.wolf.info/ American Lung Association: www.lung.org This information is not intended to replace advice given to you by your health care provider. Make sure you discuss any questions you have with your health care provider. Document Revised: 08/29/2020 Document Reviewed: 08/29/2020 Elsevier Patient Education  [place].

## 2022-02-03 NOTE — Progress Notes (Signed)
Chief Complaint:   OBESITY Pamela [name] (MR# [id number]) is a 71 y.o. female who presents for evaluation and treatment of obesity and related comorbidities. Current BMI is Body mass index is 60.94 kg/m. Pamela [name] has been struggling with her weight for many years and has been unsuccessful in either losing weight, maintaining weight loss, or reaching her healthy weight goal.  Pamela [name] likes to cook at times.  She states that she eats when she feels like it.  Pamela [name] is currently in the action stage of change and ready to dedicate time achieving and maintaining a healthier weight. Pamela [name] is interested in becoming our patient and working on intensive lifestyle modifications including (but not limited to) diet and exercise for weight loss.  Pamela [name]'s habits were reviewed today and are as follows: her desired weight loss is 44 lbs, she has been heavy most of her life, her heaviest weight ever was 374 pounds, she has significant food cravings issues, she snacks frequently in the evenings, she skips meals frequently, she is frequently drinking liquids with calories, and she struggles with emotional eating.  Depression Screen Story's Food and Mood (modified PHQ-9) score was 8.     01/29/2022    7:52 AM  Depression screen PHQ 2/9  Decreased Interest 0  Down, Depressed, Hopeless 0  PHQ - 2 Score 0  Altered sleeping 0  Tired, decreased energy 1  Change in appetite 1  Feeling bad or failure about yourself  0  Trouble concentrating 3  Moving slowly or fidgety/restless 0  Suicidal thoughts 0  PHQ-9 Score 5   Subjective:   1. Other fatigue Pamela [name] admits to daytime somnolence and denies waking up still tired. Patient has a history of symptoms of daytime fatigue. Pamela [name] generally gets 6 or 7 hours of sleep per night, and states that she has generally restful sleep. Snoring is not present. Apneic episodes are present. Epworth Sleepiness Score is 1.   2. SOB (shortness of breath) on  exertion Pamela [name] notes increasing shortness of breath with exercising and seems to be worsening over time with weight gain. She notes getting out of breath sooner with activity than she used to. This has not gotten worse recently. Pamela [name] denies shortness of breath at rest or orthopnea.  3. Essential hypertension Pamela [name] is currently on Lasix.  4. Chronic respiratory failure, unspecified whether with hypoxia or hypercapnia (HCC) Pamela [name] is taking albuterol, Z-Pak, and breathing treatments.  5. Obstructive sleep apnea Karigan is not on CPAP.  6. Prediabetes Pamela [name] is not on medications currently.  7. Other hyperlipidemia Pamela [name] is currently taking atorvastatin.  8. Health care maintenance Pamela [name] is due for labs.  She has chronic illness and comorbidities associated with obesity.  9. Vitamin D deficiency Pamela [name] is not on vitamin D currently.  10. Anemia of chronic disease Pamela [name] takes iron pills.  11. Vitamin B 12 deficiency Maryiah is not on B12 supplement.  Assessment/Plan:   1. Other fatigue Pamela [name] does feel that her weight is causing her energy to be lower than it should be. Fatigue may be related to obesity, depression or many other causes. Labs will be ordered, and in the meanwhile, Pamela [name] will focus on self care including making healthy food choices, increasing physical activity and focusing on stress reduction.  - EKG 12-Lead - TSH+T4F+T3Free  2. SOB (shortness of breath) on exertion Pamela [name] does feel that she gets out of breath more easily that she used to when she exercises. Pamela [name]'s shortness of breath appears to be  obesity related and exercise induced. She has agreed to work on weight loss and gradually increase exercise to treat her exercise induced shortness of breath. Will continue to monitor closely.  3. Essential hypertension We will check labs today, and we will follow-up at [name]'s next visit.  - Comprehensive metabolic panel  4. Chronic  respiratory failure, unspecified whether with hypoxia or hypercapnia (HCC) [name] will continue her medications.  She is to call her PCP, if she is not able to see her today then she will go to the ER if worsening symptoms.  5. Obstructive sleep apnea I discussed restful sleep and sleep hygiene with the patient today.  6. Prediabetes We will check labs today, and we will follow-up at [name]'s next visit.  - Comprehensive metabolic panel - Hemoglobin A1c - Insulin, random  7. Other hyperlipidemia Pamela [name] will continue her medications, and we will check labs today.  We will follow-up at her next visit.  - Lipid Panel With LDL/HDL Ratio  8. Health care maintenance We will check labs today.  Pamela [name] will continue to use her cane as needed.  9. Vitamin D deficiency We will check labs today, and we will follow-up at Pamela [name]'s next visit.  - VITAMIN D 25 Hydroxy (Vit-D Deficiency, Fractures)  10. Anemia of chronic disease We will check labs today, and we will follow-up at Pamela [name]'s next visit.  - Anemia panel - Ferritin  11. Vitamin B 12 deficiency We will check labs today, [name] will work on a B12 diet.  We will follow-up at her next visit.  - Vitamin B12  12. Depression screening Pamela [name] had a positive depression screening. Depression is commonly associated with obesity and often results in emotional eating behaviors. We will monitor this closely and work on CBT to help improve the non-hunger eating patterns. Referral to Psychology may be required if no improvement is seen as she continues in our clinic.  13. Class 3 severe obesity with serious comorbidity and body mass index (BMI) of 60.0 to 69.9 in adult, unspecified obesity type (HCC) Pamela [name] is currently in the action stage of change and her goal is to continue with weight loss efforts. I recommend Pamela [name] begin the structured treatment plan as follows:  She has agreed to the Category 2 Plan.  Reviewed labs with the  patient from 12/09/2021, CMP, CBC, and glucose.  Meal planning was discussed.  Exercise goals: No exercise has been prescribed at this time.   Behavioral modification strategies: increasing lean protein intake, decreasing simple carbohydrates, increasing vegetables, increasing water intake, decreasing eating out, no skipping meals, meal planning and cooking strategies, keeping healthy foods in the home, and planning for success.  She was informed of the importance of frequent follow-up visits to maximize her success with intensive lifestyle modifications for her multiple health conditions. She was informed we would discuss her lab results at her next visit unless there is a critical issue that needs to be addressed sooner. Lyndzee agreed to keep her next visit at the agreed upon time to discuss these results.  Objective:   Blood pressure 139/83, pulse 78, temperature 97.9 F (36.6 C), height 5\' 3"  (1.6 m), weight (!) 344 lb (156 kg), SpO2 (!) 89 %. Body mass index is 60.94 kg/m.  EKG: Normal sinus rhythm, rate 76 BPM.  Indirect Calorimeter completed today shows a VO2 of 211 and a REE of 1454.  Her calculated basal metabolic rate is 9629 thus her basal metabolic rate is worse than expected.  General: Cooperative, alert,  well developed, in no acute distress. HEENT: Conjunctivae and lids unremarkable. Cardiovascular: Regular rhythm.  Lungs: Normal work of breathing. Neurologic: No focal deficits.   Lab Results  Component Value Date   CREATININE 1.62 (H) 01/29/2022   BUN 23 01/29/2022   NA 142 01/29/2022   K 4.1 01/29/2022   CL 99 01/29/2022   CO2 26 01/29/2022   Lab Results  Component Value Date   ALT 9 01/29/2022   AST 17 01/29/2022   ALKPHOS 160 (H) 01/29/2022   BILITOT 0.4 01/29/2022   Lab Results  Component Value Date   HGBA1C 6.0 (H) 01/29/2022   HGBA1C 5.5 10/21/2021   HGBA1C 5.5 10/21/2021   HGBA1C 5.5 (A) 10/21/2021   HGBA1C 5.5 10/21/2021   Lab Results  Component  Value Date   INSULIN 5.8 01/29/2022   Lab Results  Component Value Date   TSH 1.870 01/29/2022   Lab Results  Component Value Date   CHOL 166 01/29/2022   HDL 55 01/29/2022   LDLCALC 96 01/29/2022   TRIG 78 01/29/2022   CHOLHDL 2.7 03/01/2019   Lab Results  Component Value Date   WBC 5.6 12/12/2021   HGB 11.2 (L) 12/12/2021   HCT 37.1 01/29/2022   MCV 70.2 (L) 12/12/2021   PLT 150 12/12/2021   Lab Results  Component Value Date   IRON 51 01/29/2022   TIBC 255 01/29/2022   FERRITIN 298 (H) 01/29/2022   Attestation Statements:   Reviewed by clinician on day of visit: allergies, medications, problem list, medical history, surgical history, family history, social history, and previous encounter notes.   Wilhemena Durie, am acting as Location manager for [employer], DO.  I have reviewed the above documentation for accuracy and completeness, and I agree with the above. Jearld Lesch, DO

## 2022-02-03 NOTE — Progress Notes (Signed)
Patient [facility]  Established Patient Office Visit  Subjective   Patient ID: Pamela [name], female    DOB: 1951/06/13  Age: 71 y.o. MRN: [id number]  Chief Complaint  Patient presents with   Wheezing    Pt is here for 3 month follow up. Pt is having wheezing for about two weeks.    Pamela [name] is a 71 year old female with a medical history significant for hypertension, morbid obesity, COPD, asthma, sarcoidosis, history of lung cancer, chronic diastolic CHF, prediabetes, hypokalemia and chronic asthma presents with a primary complaint of increased wheezing.  Patient reports that symptoms started with a sore throat several weeks ago and she was treated with steroids at that time.  Symptoms improved some, but shortly returned.  Patient now endorses shortness of breath on exertion and increased wheezing.  She was previously on home oxygen at 2 L/min, patient has not restarted her oxygen.  The patient is followed closely by her pulmonologist for COPD and sarcoidosis.  Of note, patient has a history of lung cancer.  She is followed by the cancer center every 6 months for surveillance. Patient also has a history of essential hypertension.  Patient's hypertension has been very well controlled on current medication regimen.  She denies any chest pain, heart palpitations, or orthopnea.    Patient Active Problem List   Diagnosis Date Noted   Vitamin D deficiency 02/02/2022   Stage 3b chronic kidney disease ([place]) 09/24/2020   Malignant neoplasm of lung ([place]) 09/24/2020   Aortic atherosclerosis ([place]) 09/24/2020   PFO (patent foramen ovale) 09/24/2020   History of stroke 09/24/2020   Acute on chronic respiratory failure with hypoxia ([place]) 12/17/2019   COPD with acute exacerbation ([place]) 12/17/2019   COVID-19 virus infection 01/02/2019   Right knee pain 09/15/2018   Closed head injury with concussion 09/06/2018   Hypokalemia 09/06/2018   Hypomagnesemia  09/06/2018   Asthma, chronic obstructive, with acute exacerbation ([place]) 08/02/2018   Plantar fasciitis 04/14/2017   Microcytosis 11/28/2015   Solitary kidney 07/18/2015   Onychomycosis 01/09/2015   Ingrown nail 01/09/2015   Pain in lower limb 01/09/2015   Chronic respiratory failure with hypoxia and hypercapnia (HCC) 07/19/2014   Anemia, iron deficiency 05/27/2014   Acute gouty arthritis 05/25/2014   Obstructive sleep apnea 05/25/2014   Joint pain 05/24/2014   Chronic diastolic CHF (congestive heart failure) ([place])    Sarcoidosis    Metabolic syndrome 30/01/6225   Asthma, chronic 04/27/2014   Anemia of chronic disease 04/19/2014   Morbid obesity ([place]) 04/18/2014   Immunization due 04/18/2014   Need for prophylactic vaccination and inoculation against influenza 04/18/2014   Prediabetes 04/13/2014   Essential hypertension 04/13/2014   Lower extremity edema 04/13/2014   History of lung cancer 04/13/2014   Past Medical History:  Diagnosis Date   Arthritis    Asthma    Cancer ([place])    lung, adenocarcinoma right lung 2012   CHF (congestive heart failure) (HCC)    Preserved EF   Congenital single kidney    With chronic kidney disease   COPD (chronic obstructive pulmonary disease) ([place])    Gout    Hypertension    Lung cancer ([place]) 2012   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 Patient notes it was consistent  with sarcoidosis but actual pathology results not available.   Oxygen deficiency    Sarcoidosis    Sickle cell trait ([place])    Sleep apnea    Past Surgical History:  Procedure Laterality Date   LOOP RECORDER INSERTION N/A 09/08/2018   Procedure: LOOP RECORDER INSERTION;  Surgeon: Evans Lance, MD;  Location: [facility];  Service: Cardiovascular;  Laterality: N/A;   LUNG BIOPSY     TEE  WITHOUT CARDIOVERSION N/A 09/08/2018   Procedure: TRANSESOPHAGEAL ECHOCARDIOGRAM (TEE);  Surgeon: Josue Hector, MD;  Location: [facility] ENDOSCOPY;  Service: Cardiovascular;  Laterality: N/A;  with loop   TUBAL LIGATION     VEIN LIGATION AND STRIPPING     Social History   Tobacco Use   Smoking status: Never   Smokeless tobacco: Never  Vaping Use   Vaping Use: Never used  Substance Use Topics   Alcohol use: No    Alcohol/week: 0.0 standard drinks of alcohol   Drug use: No   Social History   Socioeconomic History   Marital status: Divorced    Spouse name: Not on file   Number of children: 2   Years of education: Not on file   Highest education level: Not on file  Occupational History   Not on file  Tobacco Use   Smoking status: Never   Smokeless tobacco: Never  Vaping Use   Vaping Use: Never used  Substance and Sexual Activity   Alcohol use: No    Alcohol/week: 0.0 standard drinks of alcohol   Drug use: No   Sexual activity: Not Currently  Other Topics Concern   Not on file  Social History Narrative   Lives with son.  One son is deceased.     Social Determinants of Health   Financial Resource Strain: Not on file  Food Insecurity: Food Insecurity Present (12/03/2020)   Hunger Vital Sign    Worried About Running Out of Food in the Last Year: Sometimes true    Ran Out of Food in the Last Year: Sometimes true  Transportation Needs: No Transportation Needs (12/03/2020)   PRAPARE - Hydrologist (Medical): No    Lack of Transportation (Non-Medical): No  Physical Activity: Not on file  Stress: Not on file  Social Connections: Not on file  Intimate Partner Violence: Not on file   Family Status  Relation Name Status   Mother  Deceased   Father  Deceased   Sister  (Not Specified)   Sister  (Not Specified)   Brother  (Not Specified)   Family History  Problem Relation Age of [place]   Hypertension Mother    Renal Disease Mother    High blood  pressure Mother    Heart disease Mother    Cancer Father        stomach   Heart disease Father        No details   Cervical cancer Sister    Diabetes Sister    Multiple myeloma Sister    Cancer Brother    Diabetes Brother    Allergies  Allergen Reactions   Sulfa Antibiotics Hives and Rash     Review of Systems  Constitutional: Negative.   HENT: Negative.    Eyes: Negative.   Respiratory:  Positive for cough, shortness of breath and wheezing.   Cardiovascular: Negative.  Negative for chest pain, orthopnea and leg swelling.  Gastrointestinal: Negative.   Genitourinary: Negative.   Musculoskeletal: Negative.  Skin: Negative.   Neurological: Negative.   Endo/Heme/Allergies:  Negative for polydipsia.  Psychiatric/Behavioral: Negative.        Objective:     BP 130/66 (BP Location: Right Arm, Patient Position: Sitting, Cuff Size: Large)   Pulse 70   Temp 98 F (36.7 C)   Resp (!) 21   Ht '5\' 3"'  (1.6 m)   Wt (!) 343 lb 12.8 oz (155.9 kg)   SpO2 92%   BMI 60.90 kg/m  BP Readings from Last 3 Encounters:  02/03/22 130/66  01/29/22 139/83  12/29/21 (!) 132/45   Wt Readings from Last 3 Encounters:  02/03/22 (!) 343 lb 12.8 oz (155.9 kg)  01/29/22 (!) 344 lb (156 kg)  12/29/21 (!) 354 lb 9.6 oz (160.8 kg)   Physical Exam Constitutional:      Appearance: Normal appearance. She is obese.  Eyes:     Pupils: Pupils are equal, round, and reactive to light.  Cardiovascular:     Rate and Rhythm: Normal rate.  Pulmonary:     Effort: Pulmonary effort is normal.     Breath sounds: Examination of the right-upper field reveals wheezing. Examination of the left-upper field reveals wheezing. Examination of the right-lower field reveals wheezing. Examination of the left-lower field reveals wheezing. Wheezing present.  Abdominal:     General: Bowel sounds are normal.  Musculoskeletal:        General: Normal range of motion.  Skin:    General: Skin is warm.  Neurological:      General: No focal deficit present.     Mental Status: She is alert.  Psychiatric:        Mood and Affect: Mood normal.        Behavior: Behavior normal.        Thought Content: Thought content normal.        Judgment: Judgment normal.     No results found for any visits on 02/03/22.  Last CBC Lab Results  Component Value Date   WBC 5.6 12/12/2021   HGB 11.2 (L) 12/12/2021   HCT 37.1 01/29/2022   MCV 70.2 (L) 12/12/2021   MCH 22.1 (L) 12/12/2021   RDW 15.3 12/12/2021   PLT 150 01/56/1537   Last metabolic panel Lab Results  Component Value Date   GLUCOSE 79 01/29/2022   NA 142 01/29/2022   K 4.1 01/29/2022   CL 99 01/29/2022   CO2 26 01/29/2022   BUN 23 01/29/2022   CREATININE 1.62 (H) 01/29/2022   EGFR 34 (L) 01/29/2022   CALCIUM 9.4 01/29/2022   PHOS 4.2 12/19/2019   PROT 6.9 01/29/2022   ALBUMIN 4.0 01/29/2022   LABGLOB 2.9 01/29/2022   AGRATIO 1.4 01/29/2022   BILITOT 0.4 01/29/2022   ALKPHOS 160 (H) 01/29/2022   AST 17 01/29/2022   ALT 9 01/29/2022   ANIONGAP 8 12/12/2021   Last lipids Lab Results  Component Value Date   CHOL 166 01/29/2022   HDL 55 01/29/2022   LDLCALC 96 01/29/2022   TRIG 78 01/29/2022   CHOLHDL 2.7 03/01/2019   Last hemoglobin A1c Lab Results  Component Value Date   HGBA1C 6.0 (H) 01/29/2022   Last thyroid functions Lab Results  Component Value Date   TSH 1.870 01/29/2022   Last vitamin D Lab Results  Component Value Date   VD25OH 8.3 (L) 01/29/2022   Last vitamin B12 and Folate Lab Results  Component Value Date   VITAMINB12 830 01/29/2022   FOLATE 13.8 05/25/2014  The ASCVD Risk score (Arnett DK, et al., 2019) failed to calculate for the following reasons:   The patient has a prior MI or stroke diagnosis    Assessment & Plan:   Problem List Items Addressed This Visit       Respiratory   Asthma, chronic   Relevant Medications   albuterol (PROAIR HFA) 108 (90 Base) MCG/ACT inhaler    Fluticasone-Salmeterol 55-14 MCG/ACT AEPB   Other Visit Diagnoses     COPD exacerbation ([place])    -  Primary   Relevant Medications   albuterol (PROAIR HFA) 108 (90 Base) MCG/ACT inhaler   Fluticasone-Salmeterol 55-14 MCG/ACT AEPB   Class 3 severe obesity due to excess calories with serious comorbidity and body mass index (BMI) of 50.0 to 59.9 in adult [facility])       History of COPD       Wheezing on inspiration          1. Mild persistent chronic asthma without complication  - albuterol (PROAIR HFA) 108 (90 Base) MCG/ACT inhaler; Inhale 2 puffs into the lungs every 4 (four) hours as needed for wheezing or shortness of breath.  Dispense: 8 g; Refill: 3 - Fluticasone-Salmeterol 55-14 MCG/ACT AEPB; Take 2 puffs first thing in am and then another 2 puffs about 12 hours later.  Dispense: 1 each; Refill: 11 - predniSONE (STERAPRED UNI-PAK 21 TAB) 10 MG (21) TBPK tablet; Tapered steroid pack  Dispense: 21 tablet; Refill: 0  2. COPD exacerbation (HCC)  - predniSONE (STERAPRED UNI-PAK 21 TAB) 10 MG (21) TBPK tablet; Tapered steroid pack  Dispense: 21 tablet; Refill: 0  3. Class 3 severe obesity due to excess calories with serious comorbidity and body mass index (BMI) of 50.0 to 59.9 in adult [facility]) The patient is asked to make an attempt to improve diet and exercise patterns to aid in medical management of this problem.  Patient recently establish care with weight loss clinic and has been following a restricted calorie diet.  - POCT URINALYSIS DIP (CLINITEK)  4. History of COPD Follow-up with pulmonologist as scheduled  5. Wheezing on inspiration  - methylPREDNISolone sodium succinate (SOLU-MEDROL) 125 mg/2 mL injection 80 mg  6. Essential hypertension BP 130/66 (BP Location: Right Arm, Patient Position: Sitting, Cuff Size: Large)   Pulse 70   Temp 98 F (36.7 C)   Resp (!) 21   Ht '5\' 3"'  (1.6 m)   Wt (!) 343 lb 12.8 oz (155.9 kg)   SpO2 92%   BMI 60.90 kg/m   - Continue medication,  monitor blood pressure at home. Continue DASH diet.  Reminder to go to the ER if any CP, SOB, nausea, dizziness, severe HA, changes vision/speech, left arm numbness and tingling and jaw pain.    Return in about 3 months (around 05/06/2022) for hyperlipidemia, hypertension.   [name]  APRN, MSN, FNP-C [facility] [address] [place], [place] [address] [phone]

## 2022-02-10 ENCOUNTER — Ambulatory Visit: Payer: Medicare Other | Admitting: Family Medicine

## 2022-02-12 ENCOUNTER — Encounter (INDEPENDENT_AMBULATORY_CARE_PROVIDER_SITE_OTHER): Payer: Self-pay | Admitting: Bariatrics

## 2022-02-12 ENCOUNTER — Ambulatory Visit (INDEPENDENT_AMBULATORY_CARE_PROVIDER_SITE_OTHER): Payer: Medicare Other | Admitting: Bariatrics

## 2022-02-12 VITALS — BP 117/74 | HR 72 | Temp 97.8°F | Ht 63.0 in | Wt 335.0 lb

## 2022-02-12 DIAGNOSIS — R7303 Prediabetes: Secondary | ICD-10-CM

## 2022-02-12 DIAGNOSIS — E559 Vitamin D deficiency, unspecified: Secondary | ICD-10-CM | POA: Diagnosis not present

## 2022-02-12 DIAGNOSIS — E669 Obesity, unspecified: Secondary | ICD-10-CM

## 2022-02-12 DIAGNOSIS — Z6841 Body Mass Index (BMI) 40.0 and over, adult: Secondary | ICD-10-CM

## 2022-02-12 DIAGNOSIS — N189 Chronic kidney disease, unspecified: Secondary | ICD-10-CM | POA: Diagnosis not present

## 2022-02-12 MED ORDER — VITAMIN D (ERGOCALCIFEROL) 1.25 MG (50000 UNIT) PO CAPS: 50000.0000 [IU] | ORAL_CAPSULE | ORAL | 0 refills | Status: DC

## 2022-02-12 NOTE — Progress Notes (Signed)
[facility] Summary Report / Loop Recorder 

## 2022-02-18 ENCOUNTER — Telehealth: Payer: Self-pay

## 2022-02-18 NOTE — Telephone Encounter (Signed)
Febuxostat Calcitriol Potassium chloride

## 2022-02-19 ENCOUNTER — Other Ambulatory Visit: Payer: Self-pay

## 2022-02-19 DIAGNOSIS — M109 Gout, unspecified: Secondary | ICD-10-CM

## 2022-02-19 DIAGNOSIS — E876 Hypokalemia: Secondary | ICD-10-CM

## 2022-02-19 MED ORDER — FEBUXOSTAT 40 MG PO TABS: 40.0000 mg | ORAL_TABLET | Freq: Every day | ORAL | 1 refills | Status: DC

## 2022-02-19 MED ORDER — POTASSIUM CHLORIDE CRYS ER 20 MEQ PO TBCR: EXTENDED_RELEASE_TABLET | ORAL | 1 refills | Status: DC

## 2022-02-19 MED ORDER — CALCITRIOL 0.25 MCG PO CAPS: 0.2500 ug | ORAL_CAPSULE | Freq: Every day | ORAL | 0 refills | Status: DC

## 2022-02-23 ENCOUNTER — Ambulatory Visit (INDEPENDENT_AMBULATORY_CARE_PROVIDER_SITE_OTHER): Payer: Medicare Other

## 2022-02-23 DIAGNOSIS — I639 Cerebral infarction, unspecified: Secondary | ICD-10-CM | POA: Diagnosis not present

## 2022-02-23 LAB — CUP PACEART REMOTE DEVICE CHECK
Date Time Interrogation Session: 20230804231541
Implantable Pulse Generator Implant Date: 20200220

## 2022-02-24 ENCOUNTER — Encounter (INDEPENDENT_AMBULATORY_CARE_PROVIDER_SITE_OTHER): Payer: Self-pay | Admitting: Bariatrics

## 2022-02-24 NOTE — Progress Notes (Signed)
Chief Complaint:   OBESITY Pamela [name] is here to discuss her progress with her obesity treatment plan along with follow-up of her obesity related diagnoses. Pamela [name] is on the Category 2 Plan and states she is following her eating plan approximately 75% of the time. Pamela [name] states she is doing 0 minutes 0 times per week.  Today's visit was #: 2 Starting weight: 344 lbs Starting date: 01/29/2022 Today's weight: 335 lbs Today's date: 02/12/2022 Total lbs lost to date: 9 Total lbs lost since last in-office visit: 9  Interim History: Pamela [name] is down 9 pounds since her first visit.  She has been on vacation and she is drinking adequate water, and not skipping meals.  Subjective:   1. Vitamin D deficiency Pamela [name]'s current vitamin D level was 8.3.  2. Pre-diabetes Pamela [name]'s current A1c level is 6.0, and she is not on medications.  3. Chronic kidney disease, unspecified CKD stage Pamela [name] is seeing nephrology.  Assessment/Plan:   1. Vitamin D deficiency Daria agreed to start prescription vitamin D 50,000 units once weekly, with no refills.  - Vitamin D, Ergocalciferol, (DRISDOL) 1.25 MG (50000 UNIT) CAPS capsule; Take 1 capsule (50,000 Units total) by mouth every 7 (seven) days.  Dispense: 5 capsule; Refill: 0  2. Pre-diabetes Handout for insulin resistance and prediabetes were given to the patient today.  3. Chronic kidney disease, unspecified CKD stage Pamela [name] will continue to follow-up with her nephrologist every 6 months.  She is to limit her protein intake to 70 grams per day.  4. Obesity, Current BMI 59.4 Pamela [name] is currently in the action stage of change. As such, her goal is to continue with weight loss efforts. She has agreed to the Category 2 Plan.   Meal planning was discussed.  Review labs with the patient from 01/29/2022, CMP, lipids, iron, anemia panel, vitamin D, A1c, insulin, and thyroid panel.  Exercise goals: No exercise has been prescribed at this  time.  Behavioral modification strategies: increasing lean protein intake, decreasing simple carbohydrates, increasing vegetables, increasing water intake, decreasing eating out, no skipping meals, meal planning and cooking strategies, keeping healthy foods in the home, and planning for success.  Pamela [name] has agreed to follow-up with our clinic in 2 to 3 weeks. She was informed of the importance of frequent follow-up visits to maximize her success with intensive lifestyle modifications for her multiple health conditions.   Objective:   Blood pressure 117/74, pulse 72, temperature 97.8 F (36.6 C), height 5\' 3"  (1.6 m), weight (!) 335 lb (152 kg), SpO2 92 %. Body mass index is 59.34 kg/m.  General: Cooperative, alert, well developed, in no acute distress. HEENT: Conjunctivae and lids unremarkable. Cardiovascular: Regular rhythm.  Lungs: Normal work of breathing. Neurologic: No focal deficits.   Lab Results  Component Value Date   CREATININE 1.62 (H) 01/29/2022   BUN 23 01/29/2022   NA 142 01/29/2022   K 4.1 01/29/2022   CL 99 01/29/2022   CO2 26 01/29/2022   Lab Results  Component Value Date   ALT 9 01/29/2022   AST 17 01/29/2022   ALKPHOS 160 (H) 01/29/2022   BILITOT 0.4 01/29/2022   Lab Results  Component Value Date   HGBA1C 6.0 (H) 01/29/2022   HGBA1C 5.5 10/21/2021   HGBA1C 5.5 10/21/2021   HGBA1C 5.5 (A) 10/21/2021   HGBA1C 5.5 10/21/2021   Lab Results  Component Value Date   INSULIN 5.8 01/29/2022   Lab Results  Component Value Date   TSH 1.870 01/29/2022  Lab Results  Component Value Date   CHOL 166 01/29/2022   HDL 55 01/29/2022   LDLCALC 96 01/29/2022   TRIG 78 01/29/2022   CHOLHDL 2.7 03/01/2019   Lab Results  Component Value Date   VD25OH 8.3 (L) 01/29/2022   Lab Results  Component Value Date   WBC 5.6 12/12/2021   HGB 11.2 (L) 12/12/2021   HCT 37.1 01/29/2022   MCV 70.2 (L) 12/12/2021   PLT 150 12/12/2021   Lab Results  Component  Value Date   IRON 51 01/29/2022   TIBC 255 01/29/2022   FERRITIN 298 (H) 01/29/2022   Attestation Statements:   Reviewed by clinician on day of visit: allergies, medications, problem list, medical history, surgical history, family history, social history, and previous encounter notes.  Wilhemena Durie, am acting as Location manager for [employer], DO.  I have reviewed the above documentation for accuracy and completeness, and I agree with the above. Jearld Lesch, DO

## 2022-02-25 ENCOUNTER — Telehealth: Payer: Self-pay

## 2022-02-25 ENCOUNTER — Encounter (INDEPENDENT_AMBULATORY_CARE_PROVIDER_SITE_OTHER): Payer: Self-pay

## 2022-02-25 DIAGNOSIS — I5030 Unspecified diastolic (congestive) heart failure: Secondary | ICD-10-CM

## 2022-02-25 MED ORDER — FUROSEMIDE 40 MG PO TABS: 40.0000 mg | ORAL_TABLET | Freq: Three times a day (TID) | ORAL | 2 refills | Status: DC

## 2022-02-25 NOTE — Telephone Encounter (Signed)
Refill sent.

## 2022-02-25 NOTE — Addendum Note (Signed)
Addended by: Wadie Lessen on: 02/25/2022 04:41 PM   Modules accepted: Orders

## 2022-02-25 NOTE — Telephone Encounter (Signed)
Fursemide 40mg 

## 2022-03-10 ENCOUNTER — Encounter (INDEPENDENT_AMBULATORY_CARE_PROVIDER_SITE_OTHER): Payer: Self-pay | Admitting: Nurse Practitioner

## 2022-03-10 ENCOUNTER — Ambulatory Visit (INDEPENDENT_AMBULATORY_CARE_PROVIDER_SITE_OTHER): Payer: Medicare Other | Admitting: Nurse Practitioner

## 2022-03-10 VITALS — BP 127/78 | HR 77 | Temp 98.0°F | Ht 63.0 in | Wt 336.0 lb

## 2022-03-10 DIAGNOSIS — N189 Chronic kidney disease, unspecified: Secondary | ICD-10-CM | POA: Diagnosis not present

## 2022-03-10 DIAGNOSIS — E669 Obesity, unspecified: Secondary | ICD-10-CM

## 2022-03-10 DIAGNOSIS — R7303 Prediabetes: Secondary | ICD-10-CM

## 2022-03-10 DIAGNOSIS — Z6841 Body Mass Index (BMI) 40.0 and over, adult: Secondary | ICD-10-CM

## 2022-03-10 DIAGNOSIS — E559 Vitamin D deficiency, unspecified: Secondary | ICD-10-CM | POA: Diagnosis not present

## 2022-03-10 MED ORDER — VITAMIN D (ERGOCALCIFEROL) 1.25 MG (50000 UNIT) PO CAPS: 50000.0000 [IU] | ORAL_CAPSULE | ORAL | 0 refills | Status: DC

## 2022-03-13 ENCOUNTER — Encounter: Payer: Self-pay | Admitting: Adult Health

## 2022-03-13 ENCOUNTER — Ambulatory Visit (INDEPENDENT_AMBULATORY_CARE_PROVIDER_SITE_OTHER): Payer: Medicare Other | Admitting: Adult Health

## 2022-03-13 DIAGNOSIS — J45901 Unspecified asthma with (acute) exacerbation: Secondary | ICD-10-CM | POA: Diagnosis not present

## 2022-03-13 DIAGNOSIS — I639 Cerebral infarction, unspecified: Secondary | ICD-10-CM

## 2022-03-13 DIAGNOSIS — J441 Chronic obstructive pulmonary disease with (acute) exacerbation: Secondary | ICD-10-CM | POA: Diagnosis not present

## 2022-03-13 MED ORDER — AMOXICILLIN-POT CLAVULANATE 875-125 MG PO TABS: 1.0000 | ORAL_TABLET | Freq: Two times a day (BID) | ORAL | 0 refills | Status: DC

## 2022-03-13 NOTE — Patient Instructions (Addendum)
Return for Chest xray when able .  Augmentin 875mg  Twice daily  for 7 days Liquid Mucinex DM Twice daily As needed  cough/congestion  Fluid and rest .  Continue on Advair Twice daily   Albuterol inhaler As needed   Follow up with Dr. Melvyn Novas as planned and As needed   Please contact office for sooner follow up if symptoms do not improve or worsen or seek emergency care

## 2022-03-13 NOTE — Progress Notes (Signed)
@Patient  ID: Pamela [name], female    DOB: 07-08-1951, 71 y.o.   MRN: [id number]  Chief Complaint  Patient presents with   Follow-up    Referring provider: Dorena Dew, FNP  HPI: 71 year old female never smoker followed for asthma, sarcoid , history of lung cancer diagnosed in 2012 status post SBRT Medical history significant for right anterior chest wall mass/right pectoralis minor muscle mass.( Bx done 2015 nondiagnostic. PET/CT chest 04/2015 shows no change in metabolic act or size suggestive of benign etiology . )  TEST/EVENTS :  CT chest Nov 20, 2021 similar posttreatment fibrosis/consolidation with volume loss in the right upper lobe.  No evidence of recurrence, tiny right upper lobe pulmonary nodule no suspicious pulmonary nodules.  A 5.5 x 2.1 cm soft tissue mass in the right pectoralis major and minor musculature.   pfts 07/31/13  FEV1  1.53 (58%) with ratio 66 > p saba ratio 74 FEV1 1.68 (64%)   FENO 06/01/2016  =   85   Spirometry 02/22/2018  FEV1 1.23 (57%)  Ratio 71 with min curvature  - FENO 02/22/2018  = 61  Split night sleep study in 07/2014 showed mild OSA only with AHI 6.9. O2 sats nml on 2l/m .   She also carries dx of Sarcoid from previous bx. (followed previously by Dr. Bartholome Bill at [facility], with lymphadenopathy in chest , abd w/ possible spleen and liver involvement.    03/13/2022 Acute OV  Patient presents for an acute office visit.  Patient complains of 4 weeks of ongoing cough, congestion, thick mucus.  She denies any hemoptysis, chest pain, orthopnea.  She remains on Advair twice daily.  She says mucus is very thick.  Has had increased albuterol use.  Was seen a month ago by primary care and given a prednisone taper.  States that symptoms have persisted with ongoing cough and congestion.  Now mucus is starting to turn yellow.  Patient denies any nausea vomiting diarrhea.  Appetite is fair.      Allergies  Allergen Reactions   Sulfa Antibiotics  Hives and Rash    Immunization History  Administered Date(s) Administered   Influenza,inj,Quad PF,6+ Mos 04/13/2014, 04/16/2015, 04/03/2016, 05/19/2017, 06/15/2018, 05/14/2019   PFIZER(Purple Top)SARS-COV-2 Vaccination 10/14/2019, 11/08/2019, 07/08/2020, 03/06/2021   Pneumococcal Conjugate-13 04/03/2016   Pneumococcal Polysaccharide-23 04/13/2014, 12/26/2019   Tdap 01/02/2015   Zoster, Live 05/21/2014    Past Medical History:  Diagnosis Date   Arthritis    Asthma    Cancer ([place])    lung, adenocarcinoma right lung 2012   CHF (congestive heart failure) (HCC)    Preserved EF   Congenital single kidney    With chronic kidney disease   COPD (chronic obstructive pulmonary disease) ([place])    Gout    Hypertension    Lung cancer ([place]) 2012   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 Patient notes it was consistent with sarcoidosis but actual pathology results not available.   Oxygen deficiency    Sarcoidosis    Sickle cell trait ([place])    Sleep apnea     Tobacco History: Social History   Tobacco Use  Smoking Status Never  Smokeless Tobacco Never   Counseling given: Not Answered   Outpatient Medications Prior to Visit  Medication Sig Dispense Refill   acetaminophen (  TYLENOL) 500 MG tablet Take 1,000 mg by mouth every 6 (six) hours as needed for mild pain.      albuterol (PROAIR HFA) 108 (90 Base) MCG/ACT inhaler Inhale 2 puffs into the lungs every 4 (four) hours as needed for wheezing or shortness of breath. 8 g 3   albuterol (PROVENTIL) (2.5 MG/3ML) 0.083% nebulizer solution USE 1 VIAL IN NEBULIZER EVERY 6 HOURS AS NEEDED FOR WHEEZING FOR SHORTNESS OF BREATH 300 mL 2   aspirin EC 81 MG tablet Take 1 tablet (81 mg total) by mouth daily. Swallow whole. 30 tablet 11   calcitRIOL (ROCALTROL) 0.25 MCG  capsule Take 1 capsule (0.25 mcg total) by mouth daily. 90 capsule 0   docusate sodium (COLACE) 100 MG capsule Take 1 capsule (100 mg total) by mouth 2 (two) times daily. 60 capsule 0   febuxostat (ULORIC) 40 MG tablet Take 1 tablet (40 mg total) by mouth daily. 90 tablet 1   Fluticasone-Salmeterol 55-14 MCG/ACT AEPB Take 2 puffs first thing in am and then another 2 puffs about 12 hours later. 1 each 11   furosemide (LASIX) 40 MG tablet Take 1 tablet (40 mg total) by mouth 3 (three) times daily. 90 tablet 2   potassium chloride SA (KLOR-CON M) 20 MEQ tablet TAKE 1  BY MOUTH ONCE DAILY 90 tablet 1   atorvastatin (LIPITOR) 40 MG tablet Take 1 tablet by mouth once daily (Patient not taking: Reported on 03/13/2022) 90 tablet 0   magnesium oxide (MAG-OX) 400 (241.3 Mg) MG tablet Take 1 tablet (400 mg total) by mouth daily. (Patient not taking: Reported on 02/12/2022) 30 tablet 0   montelukast (SINGULAIR) 10 MG tablet TAKE 1 TABLET BY MOUTH AT BEDTIME (Patient not taking: Reported on 02/12/2022) 90 tablet 0   predniSONE (STERAPRED UNI-PAK 21 TAB) 10 MG (21) TBPK tablet Tapered steroid pack (Patient not taking: Reported on 03/10/2022) 21 tablet 0   Vitamin D, Ergocalciferol, (DRISDOL) 1.25 MG (50000 UNIT) CAPS capsule Take 1 capsule (50,000 Units total) by mouth every 7 (seven) days. (Patient not taking: Reported on 03/13/2022) 5 capsule 0   No facility-administered medications prior to visit.     Review of Systems:   Constitutional:   No  weight loss, night sweats,  Fevers, chills,  +fatigue, or  lassitude.  HEENT:   No headaches,  Difficulty swallowing,  Tooth/dental problems, or  Sore throat,                No sneezing, itching, ear ache,  +nasal congestion, post nasal drip,   CV:  No chest pain,  Orthopnea, PND, swelling in lower extremities, anasarca, dizziness, palpitations, syncope.   GI  No heartburn, indigestion, abdominal pain, nausea, vomiting, diarrhea, change in bowel habits, loss of  appetite, bloody stools.   Resp: .  No chest wall deformity  Skin: no rash or lesions.  GU: no dysuria, change in color of urine, no urgency or frequency.  No flank pain, no hematuria   MS:  No joint pain or swelling.  No decreased range of motion.  No back pain.    Physical Exam  BP 124/86 (BP Location: Left Wrist, Cuff Size: Normal)   Pulse 76   Temp 98 F (36.7 C)   Ht 5\' 6"  (1.676 m)   Wt (!) 340 lb 12.8 oz (154.6 kg)   SpO2 95%   BMI 55.01 kg/m   GEN: A/Ox3; pleasant , NAD, well nourished    HEENT:  [place]/AT,  NOSE-clear, THROAT-clear,  no lesions, no postnasal drip or exudate noted.   NECK:  Supple w/ fair ROM; no JVD; normal carotid impulses w/o bruits; no thyromegaly or nodules palpated; no lymphadenopathy.    RESP  Clear  P & A; w/o, wheezes/ rales/ or rhonchi. no accessory muscle use, no dullness to percussion  CARD:  RRR, no m/r/g, no peripheral edema, pulses intact, no cyanosis or clubbing.  GI:   Soft & nt; nml bowel sounds; no organomegaly or masses detected.   Musco: Warm bil, no deformities or joint swelling noted.   Neuro: alert, no focal deficits noted.    Skin: Warm, no lesions or rashes    Lab Results:  CBC   BMET     Imaging: CUP PACEART REMOTE DEVICE CHECK  Result Date: 02/23/2022 ILR summary report received. Battery status OK. Normal device function. No new symptom, tachy, brady, or pause episodes. No new AF episodes. Monthly summary reports and ROV/PRN LA   methylPREDNISolone sodium succinate (SOLU-MEDROL) 125 mg/2 mL injection 80 mg     Date Action Dose Route User   02/03/2022 1026 Given 80 mg Intramuscular (Left Deltoid) Schuyler Amor, CMA          Latest Ref Rng & Units 08/30/2018   10:03 AM 07/19/2014   11:01 AM  PFT Results  FVC-Pre L 1.92  2.32   FVC-Predicted Pre % 69  85   FVC-Post L 2.13  2.36   FVC-Predicted Post % 77  86   Pre FEV1/FVC % % 83  71   Post FEV1/FCV % % 79  85   FEV1-Pre L 1.59  1.64    FEV1-Predicted Pre % 74  76   FEV1-Post L 1.69  2.00   DLCO uncorrected ml/min/mmHg 23.90  18.43   DLCO UNC% % 112  71   DLVA Predicted % 154  101     Lab Results  Component Value Date   NITRICOXIDE 56 08/30/2018        Assessment & Plan:   Asthma, chronic obstructive, with acute exacerbation (HCC) Acute asthmatic bronchitic exacerbation with slow to resolve symptoms. Check chest x-ray today.  Patient says she will have to return for this is not able to do it right now We will treat with empiric antibiotics.  Plan Patient Instructions  Return for Chest xray when able .  Augmentin 875mg  Twice daily  for 7 days Liquid Mucinex DM Twice daily As needed  cough/congestion  Fluid and rest .  Continue on Advair Twice daily   Albuterol inhaler As needed   Follow up with Dr. Melvyn Novas as planned and As needed   Please contact office for sooner follow up if symptoms do not improve or worsen or seek emergency care         I spent   30 minutes dedicated to the care of this patient on the date of this encounter to include pre-visit review of records, face-to-face time with the patient discussing conditions above, post visit ordering of testing, clinical documentation with the electronic health record, making appropriate referrals as documented, and communicating necessary findings to members of the patients care team.    Rexene Edison, NP 03/13/2022

## 2022-03-13 NOTE — Assessment & Plan Note (Signed)
Acute asthmatic bronchitic exacerbation with slow to resolve symptoms. Check chest x-ray today.  Patient says she will have to return for this is not able to do it right now We will treat with empiric antibiotics.  Plan Patient Instructions  Return for Chest xray when able .  Augmentin 875mg  Twice daily  for 7 days Liquid Mucinex DM Twice daily As needed  cough/congestion  Fluid and rest .  Continue on Advair Twice daily   Albuterol inhaler As needed   Follow up with Dr. Melvyn Novas as planned and As needed   Please contact office for sooner follow up if symptoms do not improve or worsen or seek emergency care

## 2022-03-18 NOTE — Progress Notes (Signed)
Chief Complaint:   OBESITY Pamela [name] is here to discuss her progress with her obesity treatment plan along with follow-up of her obesity related diagnoses. Pamela [name] is on the Category 2 Plan and states she is following her eating plan approximately 50% of the time. Pamela [name] states she is exercises 0 minutes 0 times per week.  Today's visit was #: 3 Starting weight: 344 lbs Starting date: 01/29/2022 Today's weight: 336 lbs Today's date: 03/10/2022 Total lbs lost to date: 8 lbs Total lbs lost since last in-office visit: 1  Interim History: Pamela [name] has overall done well with weight loss. Requesting to discuss other options for breakfast. Does not like to eat eggs all the time. Wants to eat bacon. Most of the time eats 3 meals per day. Does not snack. Drinks water and tea with lemonade. "Eats what I want to eat".  Subjective:   1. Vitamin D deficiency Pamela [name] is not taking prescribed Vit D, as she did not get it from the pharmacy. Her last Vit D was 8.3.  2. Pre-diabetes Pamela [name] has never been on medications. Last A1C was 6.0  3. Chronic kidney disease, unspecified CKD stage Seeing nephrology on a regular basis. She is to limit her protein intake to 70 grams per day.  Assessment/Plan:   1. Vitamin D deficiency We will refill Vit D 50,000 IU once a week for 1 month with 0 refills.  Low Vitamin D level contributes to fatigue and are associated with obesity, breast, and colon cancer. She agrees to continue to take prescription Vitamin D @50 ,000 IU every week and will follow-up for routine testing of Vitamin D, at least 2-3 times per year to avoid over-replacement.   -Refill Vitamin D, Ergocalciferol, (DRISDOL) 1.25 MG (50000 UNIT) CAPS capsule; Take 1 capsule (50,000 Units total) by mouth every 7 (seven) days. (Patient not taking: Reported on 03/13/2022)  Dispense: 5 capsule; Refill: 0  2. Pre-diabetes Information given on Metformin to discuss with Nephrology prior to starting due to  CKD.  3. Chronic kidney disease, unspecified CKD stage Pamela [name] will continue to follow up with  Nephrology.  4. Obesity, Current BMI 59.6 Pamela [name] is currently in the action stage of change. As such, her goal is to continue with weight loss efforts. She has agreed to the Category 2 Plan.   Handouts given: Additional breakfast options given.  Exercise goals: All adults should avoid inactivity. Some physical activity is better than none, and adults who participate in any amount of physical activity gain some health benefits.  Behavioral modification strategies: increasing lean protein intake, increasing water intake, and no skipping meals.  Pamela [name] has agreed to follow-up with our clinic in 2 weeks. She was informed of the importance of frequent follow-up visits to maximize her success with intensive lifestyle modifications for her multiple health conditions.   Objective:   Blood pressure 127/78, pulse 77, temperature 98 F (36.7 C), height 5\' 3"  (1.6 m), weight (!) 336 lb (152.4 kg), SpO2 92 %. Body mass index is 59.52 kg/m.  General: Cooperative, alert, well developed, in no acute distress. HEENT: Conjunctivae and lids unremarkable. Cardiovascular: Regular rhythm.  Lungs: Normal work of breathing. Neurologic: No focal deficits.   Lab Results  Component Value Date   CREATININE 1.62 (H) 01/29/2022   BUN 23 01/29/2022   NA 142 01/29/2022   K 4.1 01/29/2022   CL 99 01/29/2022   CO2 26 01/29/2022   Lab Results  Component Value Date   ALT 9 01/29/2022  AST 17 01/29/2022   ALKPHOS 160 (H) 01/29/2022   BILITOT 0.4 01/29/2022   Lab Results  Component Value Date   HGBA1C 6.0 (H) 01/29/2022   HGBA1C 5.5 10/21/2021   HGBA1C 5.5 10/21/2021   HGBA1C 5.5 (A) 10/21/2021   HGBA1C 5.5 10/21/2021   Lab Results  Component Value Date   INSULIN 5.8 01/29/2022   Lab Results  Component Value Date   TSH 1.870 01/29/2022   Lab Results  Component Value Date   CHOL 166 01/29/2022    HDL 55 01/29/2022   LDLCALC 96 01/29/2022   TRIG 78 01/29/2022   CHOLHDL 2.7 03/01/2019   Lab Results  Component Value Date   VD25OH 8.3 (L) 01/29/2022   Lab Results  Component Value Date   WBC 5.6 12/12/2021   HGB 11.2 (L) 12/12/2021   HCT 37.1 01/29/2022   MCV 70.2 (L) 12/12/2021   PLT 150 12/12/2021   Lab Results  Component Value Date   IRON 51 01/29/2022   TIBC 255 01/29/2022   FERRITIN 298 (H) 01/29/2022   Attestation Statements:   Reviewed by clinician on day of visit: allergies, medications, problem list, medical history, surgical history, family history, social history, and previous encounter notes.  Time spent on visit including pre-visit chart review and post-visit care and charting was 30 minutes.   I, Brendell Tyus, RMA, am acting as transcriptionist for Everardo Pacific, FNP.  I have reviewed the above documentation for accuracy and completeness, and I agree with the above. Everardo Pacific, FNP

## 2022-03-24 ENCOUNTER — Encounter (INDEPENDENT_AMBULATORY_CARE_PROVIDER_SITE_OTHER): Payer: Self-pay | Admitting: Nurse Practitioner

## 2022-03-24 ENCOUNTER — Ambulatory Visit (INDEPENDENT_AMBULATORY_CARE_PROVIDER_SITE_OTHER): Payer: Medicare Other | Admitting: Nurse Practitioner

## 2022-03-24 VITALS — BP 125/71 | HR 74 | Temp 97.9°F | Ht 66.0 in | Wt 332.0 lb

## 2022-03-24 DIAGNOSIS — E669 Obesity, unspecified: Secondary | ICD-10-CM

## 2022-03-24 DIAGNOSIS — E559 Vitamin D deficiency, unspecified: Secondary | ICD-10-CM

## 2022-03-24 DIAGNOSIS — Z6841 Body Mass Index (BMI) 40.0 and over, adult: Secondary | ICD-10-CM | POA: Diagnosis not present

## 2022-03-26 NOTE — Progress Notes (Signed)
Chief Complaint:   OBESITY Pamela [name] is here to discuss her progress with her obesity treatment plan along with follow-up of her obesity related diagnoses. Pamela [name] is on the Category 2 Plan and states she is following her eating plan approximately 50% of the time. Pamela [name] states she is exercising 0 minutes 0 times per week.  Today's visit was #: 4 Starting weight: 344 lbs Starting date: 01/29/2022 Today's weight: 332 lbs Today's date: 03/24/2022 Total lbs lost to date: 12 lbs Total lbs lost since last in-office visit: 4  Interim History: Pamela [name] has done well with weight loss since her last visit. Has not been feeling well over the past couple of weeks. Saw pulmonary and was treated with antibiotics, Mucinex and Steroid taper pack. Started walking over the past 2 weeks. Eats 2 meals and 1 snack per day. Drinking water and tea with lemonade. Feels she is doing well.  Subjective:   1. Vitamin D deficiency Pamela [name] is currently taking prescription Vit D 50,000 IU once a week. She is doing well. Denies any nausea, vomiting or muscle weakness.  Assessment/Plan:   1. Vitamin D deficiency Continue as directed.  2. Obesity, Current BMI 58.8 Pamela [name] is currently in the action stage of change. As such, her goal is to continue with weight loss efforts. She has agreed to the Category 2 Plan.   Exercise goals: All adults should avoid inactivity. Some physical activity is better than none, and adults who participate in any amount of physical activity gain some health benefits.  Behavioral modification strategies: no skipping meals, meal planning and cooking strategies, and planning for success.  Pamela [name] has agreed to follow-up with our clinic in 2 weeks. She was informed of the importance of frequent follow-up visits to maximize her success with intensive lifestyle modifications for her multiple health conditions.   Objective:   Blood pressure 125/71, pulse 74, temperature 97.9 F (36.6 C),  height 5\' 6"  (1.676 m), weight (!) 332 lb (150.6 kg), SpO2 92 %. Body mass index is 53.59 kg/m.  General: Cooperative, alert, well developed, in no acute distress. HEENT: Conjunctivae and lids unremarkable. Cardiovascular: Regular rhythm.  Lungs: Normal work of breathing. Neurologic: No focal deficits.   Lab Results  Component Value Date   CREATININE 1.62 (H) 01/29/2022   BUN 23 01/29/2022   NA 142 01/29/2022   K 4.1 01/29/2022   CL 99 01/29/2022   CO2 26 01/29/2022   Lab Results  Component Value Date   ALT 9 01/29/2022   AST 17 01/29/2022   ALKPHOS 160 (H) 01/29/2022   BILITOT 0.4 01/29/2022   Lab Results  Component Value Date   HGBA1C 6.0 (H) 01/29/2022   HGBA1C 5.5 10/21/2021   HGBA1C 5.5 10/21/2021   HGBA1C 5.5 (A) 10/21/2021   HGBA1C 5.5 10/21/2021   Lab Results  Component Value Date   INSULIN 5.8 01/29/2022   Lab Results  Component Value Date   TSH 1.870 01/29/2022   Lab Results  Component Value Date   CHOL 166 01/29/2022   HDL 55 01/29/2022   LDLCALC 96 01/29/2022   TRIG 78 01/29/2022   CHOLHDL 2.7 03/01/2019   Lab Results  Component Value Date   VD25OH 8.3 (L) 01/29/2022   Lab Results  Component Value Date   WBC 5.6 12/12/2021   HGB 11.2 (L) 12/12/2021   HCT 37.1 01/29/2022   MCV 70.2 (L) 12/12/2021   PLT 150 12/12/2021   Lab Results  Component Value Date   IRON  51 01/29/2022   TIBC 255 01/29/2022   FERRITIN 298 (H) 01/29/2022   Attestation Statements:   Reviewed by clinician on day of visit: allergies, medications, problem list, medical history, surgical history, family history, social history, and previous encounter notes.  I spent 30 minutes with the patient and reviewing her chart before and after her visit.   I, Brendell Tyus, RMA, am acting as transcriptionist for Everardo Pacific, FNP.  I have reviewed the above documentation for accuracy and completeness, and I agree with the above. Everardo Pacific, FNP

## 2022-03-26 NOTE — Progress Notes (Signed)
[facility] Summary Report / Loop Recorder 

## 2022-03-30 ENCOUNTER — Ambulatory Visit (INDEPENDENT_AMBULATORY_CARE_PROVIDER_SITE_OTHER): Payer: Medicare Other

## 2022-03-30 DIAGNOSIS — I639 Cerebral infarction, unspecified: Secondary | ICD-10-CM | POA: Diagnosis not present

## 2022-03-31 LAB — CUP PACEART REMOTE DEVICE CHECK
Date Time Interrogation Session: 20230906232246
Implantable Pulse Generator Implant Date: 20200220

## 2022-04-03 ENCOUNTER — Encounter (HOSPITAL_COMMUNITY): Payer: Self-pay

## 2022-04-03 ENCOUNTER — Ambulatory Visit (INDEPENDENT_AMBULATORY_CARE_PROVIDER_SITE_OTHER): Payer: Medicare Other | Admitting: Adult Health

## 2022-04-03 ENCOUNTER — Emergency Department (HOSPITAL_COMMUNITY): Payer: Medicare Other

## 2022-04-03 ENCOUNTER — Other Ambulatory Visit: Payer: Self-pay

## 2022-04-03 ENCOUNTER — Inpatient Hospital Stay (HOSPITAL_COMMUNITY)
Admission: EM | Admit: 2022-04-03 | Discharge: 2022-04-08 | DRG: 190 | Disposition: A | Discharge status: Home / Self Care | Payer: Medicare Other | Attending: Internal Medicine | Admitting: Internal Medicine

## 2022-04-03 ENCOUNTER — Encounter: Payer: Self-pay | Admitting: Adult Health

## 2022-04-03 DIAGNOSIS — J441 Chronic obstructive pulmonary disease with (acute) exacerbation: Principal | ICD-10-CM | POA: Diagnosis present

## 2022-04-03 DIAGNOSIS — Z6841 Body Mass Index (BMI) 40.0 and over, adult: Secondary | ICD-10-CM

## 2022-04-03 DIAGNOSIS — Z85118 Personal history of other malignant neoplasm of bronchus and lung: Secondary | ICD-10-CM

## 2022-04-03 DIAGNOSIS — I890 Lymphedema, not elsewhere classified: Secondary | ICD-10-CM | POA: Diagnosis present

## 2022-04-03 DIAGNOSIS — J45901 Unspecified asthma with (acute) exacerbation: Secondary | ICD-10-CM

## 2022-04-03 DIAGNOSIS — G4733 Obstructive sleep apnea (adult) (pediatric): Secondary | ICD-10-CM | POA: Diagnosis present

## 2022-04-03 DIAGNOSIS — R0602 Shortness of breath: Secondary | ICD-10-CM | POA: Diagnosis not present

## 2022-04-03 DIAGNOSIS — D573 Sickle-cell trait: Secondary | ICD-10-CM | POA: Diagnosis present

## 2022-04-03 DIAGNOSIS — E875 Hyperkalemia: Secondary | ICD-10-CM | POA: Diagnosis present

## 2022-04-03 DIAGNOSIS — R059 Cough, unspecified: Secondary | ICD-10-CM | POA: Diagnosis not present

## 2022-04-03 DIAGNOSIS — I5032 Chronic diastolic (congestive) heart failure: Secondary | ICD-10-CM | POA: Diagnosis not present

## 2022-04-03 DIAGNOSIS — M109 Gout, unspecified: Secondary | ICD-10-CM | POA: Diagnosis present

## 2022-04-03 DIAGNOSIS — Z7982 Long term (current) use of aspirin: Secondary | ICD-10-CM

## 2022-04-03 DIAGNOSIS — D509 Iron deficiency anemia, unspecified: Secondary | ICD-10-CM | POA: Diagnosis present

## 2022-04-03 DIAGNOSIS — D860 Sarcoidosis of lung: Secondary | ICD-10-CM | POA: Diagnosis present

## 2022-04-03 DIAGNOSIS — Z8249 Family history of ischemic heart disease and other diseases of the circulatory system: Secondary | ICD-10-CM | POA: Diagnosis not present

## 2022-04-03 DIAGNOSIS — Z882 Allergy status to sulfonamides status: Secondary | ICD-10-CM | POA: Diagnosis not present

## 2022-04-03 DIAGNOSIS — N1832 Chronic kidney disease, stage 3b: Secondary | ICD-10-CM | POA: Diagnosis present

## 2022-04-03 DIAGNOSIS — R062 Wheezing: Secondary | ICD-10-CM | POA: Diagnosis not present

## 2022-04-03 DIAGNOSIS — N179 Acute kidney failure, unspecified: Secondary | ICD-10-CM | POA: Diagnosis present

## 2022-04-03 DIAGNOSIS — Z79899 Other long term (current) drug therapy: Secondary | ICD-10-CM

## 2022-04-03 DIAGNOSIS — Z7722 Contact with and (suspected) exposure to environmental tobacco smoke (acute) (chronic): Secondary | ICD-10-CM | POA: Diagnosis present

## 2022-04-03 DIAGNOSIS — J9601 Acute respiratory failure with hypoxia: Secondary | ICD-10-CM | POA: Diagnosis not present

## 2022-04-03 DIAGNOSIS — I130 Hypertensive heart and chronic kidney disease with heart failure and stage 1 through stage 4 chronic kidney disease, or unspecified chronic kidney disease: Secondary | ICD-10-CM | POA: Diagnosis present

## 2022-04-03 DIAGNOSIS — I639 Cerebral infarction, unspecified: Secondary | ICD-10-CM

## 2022-04-03 DIAGNOSIS — Z95818 Presence of other cardiac implants and grafts: Secondary | ICD-10-CM | POA: Diagnosis not present

## 2022-04-03 DIAGNOSIS — Z841 Family history of disorders of kidney and ureter: Secondary | ICD-10-CM

## 2022-04-03 DIAGNOSIS — Z20822 Contact with and (suspected) exposure to covid-19: Secondary | ICD-10-CM | POA: Diagnosis present

## 2022-04-03 DIAGNOSIS — I10 Essential (primary) hypertension: Secondary | ICD-10-CM | POA: Diagnosis not present

## 2022-04-03 DIAGNOSIS — R0902 Hypoxemia: Secondary | ICD-10-CM | POA: Diagnosis not present

## 2022-04-03 DIAGNOSIS — I5030 Unspecified diastolic (congestive) heart failure: Secondary | ICD-10-CM

## 2022-04-03 LAB — CBC WITH DIFFERENTIAL/PLATELET
Abs Immature Granulocytes: 0.02 10*3/uL (ref 0.00–0.07)
Basophils Absolute: 0 10*3/uL (ref 0.0–0.1)
Basophils Relative: 1 %
Eosinophils Absolute: 1.4 10*3/uL — ABNORMAL HIGH (ref 0.0–0.5)
Eosinophils Relative: 21 %
HCT: 42.5 % (ref 36.0–46.0)
Hemoglobin: 13 g/dL (ref 12.0–15.0)
Immature Granulocytes: 0 %
Lymphocytes Relative: 21 %
Lymphs Abs: 1.4 10*3/uL (ref 0.7–4.0)
MCH: 22.5 pg — ABNORMAL LOW (ref 26.0–34.0)
MCHC: 30.6 g/dL (ref 30.0–36.0)
MCV: 73.5 fL — ABNORMAL LOW (ref 80.0–100.0)
Monocytes Absolute: 0.3 10*3/uL (ref 0.1–1.0)
Monocytes Relative: 4 %
Neutro Abs: 3.4 10*3/uL (ref 1.7–7.7)
Neutrophils Relative %: 53 %
Platelets: 186 10*3/uL (ref 150–400)
RBC: 5.78 MIL/uL — ABNORMAL HIGH (ref 3.87–5.11)
RDW: 15.7 % — ABNORMAL HIGH (ref 11.5–15.5)
WBC: 6.4 10*3/uL (ref 4.0–10.5)
nRBC: 0 % (ref 0.0–0.2)

## 2022-04-03 LAB — TROPONIN I (HIGH SENSITIVITY)
Troponin I (High Sensitivity): 6 ng/L (ref <18)
Troponin I (High Sensitivity): 6 ng/L (ref <18)

## 2022-04-03 LAB — BASIC METABOLIC PANEL
Anion gap: 11 (ref 5–15)
BUN: 34 mg/dL — ABNORMAL HIGH (ref 8–23)
CO2: 29 mmol/L (ref 22–32)
Calcium: 9.1 mg/dL (ref 8.9–10.3)
Chloride: 102 mmol/L (ref 98–111)
Creatinine, Ser: 1.67 mg/dL — ABNORMAL HIGH (ref 0.44–1.00)
GFR, Estimated: 33 mL/min — ABNORMAL LOW (ref >60)
Glucose, Bld: 124 mg/dL — ABNORMAL HIGH (ref 70–99)
Potassium: 4.2 mmol/L (ref 3.5–5.1)
Sodium: 142 mmol/L (ref 135–145)

## 2022-04-03 LAB — RESP PANEL BY RT-PCR (FLU A&B, COVID) ARPGX2
Influenza A by PCR: NEGATIVE
Influenza B by PCR: NEGATIVE
SARS Coronavirus 2 by RT PCR: NEGATIVE

## 2022-04-03 LAB — BRAIN NATRIURETIC PEPTIDE: B Natriuretic Peptide: 22.4 pg/mL (ref 0.0–100.0)

## 2022-04-03 LAB — D-DIMER, QUANTITATIVE: D-Dimer, Quant: 2.18 ug/mL-FEU — ABNORMAL HIGH (ref 0.00–0.50)

## 2022-04-03 MED ORDER — IPRATROPIUM BROMIDE 0.02 % IN SOLN
0.5000 mg | Freq: Once | RESPIRATORY_TRACT | Status: AC
  Administered 2022-04-03: 0.5 mg via RESPIRATORY_TRACT
  Filled 2022-04-03: qty 2.5

## 2022-04-03 MED ORDER — IPRATROPIUM-ALBUTEROL 0.5-2.5 (3) MG/3ML IN SOLN
3.0000 mL | Freq: Once | RESPIRATORY_TRACT | Status: AC
  Administered 2022-04-03: 3 mL via RESPIRATORY_TRACT
  Filled 2022-04-03: qty 3

## 2022-04-03 MED ORDER — ACETAMINOPHEN 325 MG PO TABS
650.0000 mg | ORAL_TABLET | Freq: Once | ORAL | Status: AC
  Administered 2022-04-03: 650 mg via ORAL
  Filled 2022-04-03: qty 2

## 2022-04-03 MED ORDER — MAGNESIUM SULFATE 2 GM/50ML IV SOLN
2.0000 g | Freq: Once | INTRAVENOUS | Status: AC
  Administered 2022-04-03: 2 g via INTRAVENOUS
  Filled 2022-04-03: qty 50

## 2022-04-03 MED ORDER — ALBUTEROL SULFATE (2.5 MG/3ML) 0.083% IN NEBU
10.0000 mg/h | INHALATION_SOLUTION | Freq: Once | RESPIRATORY_TRACT | Status: AC
  Administered 2022-04-03: 10 mg/h via RESPIRATORY_TRACT
  Filled 2022-04-03: qty 12

## 2022-04-03 NOTE — ED Notes (Signed)
RT en route to give patient breathing treatment.

## 2022-04-03 NOTE — ED Provider Triage Note (Signed)
Emergency Medicine Provider Triage Evaluation Note  Jaylise Peek , a 71 y.o. female  was evaluated in triage.  Pt complains of asthma exacerbation.  Ongoing since July worse in the past week.  She was seen by her pulmonology clinic last week started on antibiotics, steroids, Mucinex without improvement.  She was hypoxic at her pulmonology clinic today and was sent to the emergency room after she did not have improvement with albuterol.  Audible wheezing on exam.  I did take patient off supplemental O2 and she maintained sats 100% on room air.  Review of Systems  Positive: As above Negative: As above  Physical Exam  BP 126/75 (BP Location: Right Arm)   Pulse 68   Temp 97.7 F (36.5 C) (Oral)   Resp 18   Ht 5\' 6"  (1.676 m)   Wt (!) 149.9 kg   SpO2 100% Comment: with duoneb going.  BMI 53.33 kg/m  Gen:   Awake, no distress   Resp:  Normal effort  MSK:   Moves extremities without difficulty  Other:    Medical Decision Making  Medically screening exam initiated at 4:59 PM.  Appropriate orders placed.  Davy Faught was informed that the remainder of the evaluation will be completed by another provider, this initial triage assessment does not replace that evaluation, and the importance of remaining in the ED until their evaluation is complete.  Labs ordered.  Chest x-ray ordered.  Patient received DuoNeb, Solu-Medrol prior to arrival.   Evlyn Courier, [place] 04/03/22 1713

## 2022-04-03 NOTE — ED Triage Notes (Signed)
Per EMS- Patient c/o SOB x 3 days. Patient c/o SOB and wheezing due to asthma. Sats were 88% at her PCP today Patient was placed on O2 2L/min via [place]. Patient was given solumedrol 125 mg IV and a duoneb prior to arrival to the ED.

## 2022-04-03 NOTE — ED Provider Notes (Signed)
[facility] DEPT Provider Note   CSN: [id number] Arrival date & time: 04/03/22  1628     History  Chief Complaint  Patient presents with   Shortness of Breath    Pamela [name] is a 71 y.o. female.  Patient with a history of CHF with preserved ejection fraction, COPD, sarcoidosis, lung cancer, sickle cell trait presenting from her pulmonology clinic with shortness of breath and hypoxia.  States she has had difficulty breathing since July and been treated multiple times with antibiotics and steroids.  She completed her last course about a week ago.  She was hypoxic at her pulmonary clinic and sent to the ER.  Still having cough, congestion, shortness of breath for the past 4 to 5 days.  Cough productive of green and yellow mucus.  Denies chest pain.  Denies change in her chronic leg swelling.  Denies abdominal pain, nausea or vomiting.  States compliance with her Lasix and nebulizers at home.  The history is provided by the patient.  Shortness of Breath Associated symptoms: cough   Associated symptoms: no chest pain, no fever, no headaches, no neck pain and no vomiting        Home Medications Prior to Admission medications   Medication Sig Start Date End Date Taking? Authorizing Provider  acetaminophen (TYLENOL) 500 MG tablet Take 1,000 mg by mouth every 6 (six) hours as needed for mild pain.     [provider]  albuterol (PROAIR HFA) 108 (90 Base) MCG/ACT inhaler Inhale 2 puffs into the lungs every 4 (four) hours as needed for wheezing or shortness of breath. 02/03/22   Dorena Dew, FNP  albuterol (PROVENTIL) (2.5 MG/3ML) 0.083% nebulizer solution USE 1 VIAL IN NEBULIZER EVERY 6 HOURS AS NEEDED FOR WHEEZING FOR SHORTNESS OF BREATH 09/11/21   Dorena Dew, FNP  amoxicillin-clavulanate (AUGMENTIN) 875-125 MG tablet Take 1 tablet by mouth 2 (two) times daily. 03/13/22   Parrett, Fonnie Mu, NP  aspirin EC 81 MG tablet Take 1 tablet (81 mg  total) by mouth daily. Swallow whole. 04/26/20   Werner Lean, MD  atorvastatin (LIPITOR) 40 MG tablet Take 1 tablet by mouth once daily Patient not taking: Reported on 03/13/2022 08/01/21   Dorena Dew, FNP  calcitRIOL (ROCALTROL) 0.25 MCG capsule Take 1 capsule (0.25 mcg total) by mouth daily. 02/19/22   Dorena Dew, FNP  docusate sodium (COLACE) 100 MG capsule Take 1 capsule (100 mg total) by mouth 2 (two) times daily. 09/30/18   Granville Lewis C, PA-C  febuxostat (ULORIC) 40 MG tablet Take 1 tablet (40 mg total) by mouth daily. 02/19/22   Dorena Dew, FNP  Fluticasone-Salmeterol [phone] MCG/ACT AEPB Take 2 puffs first thing in am and then another 2 puffs about 12 hours later. 02/03/22   Dorena Dew, FNP  furosemide (LASIX) 40 MG tablet Take 1 tablet (40 mg total) by mouth 3 (three) times daily. 02/25/22   Dorena Dew, FNP  potassium chloride SA (KLOR-CON M) 20 MEQ tablet TAKE 1  BY MOUTH ONCE DAILY 02/19/22   Dorena Dew, FNP  Vitamin D, Ergocalciferol, (DRISDOL) 1.25 MG (50000 UNIT) CAPS capsule Take 1 capsule (50,000 Units total) by mouth every 7 (seven) days. Patient not taking: Reported on 03/13/2022 03/10/22   Everardo Pacific, FNP      Allergies    Sulfa antibiotics    Review of Systems   Review of Systems  Constitutional:  Negative for activity change, appetite change  and fever.  HENT:  Positive for congestion and rhinorrhea.   Respiratory:  Positive for cough and shortness of breath.   Cardiovascular:  Negative for chest pain.  Gastrointestinal:  Negative for nausea and vomiting.  Genitourinary:  Negative for dysuria, hematuria, vaginal bleeding and vaginal discharge.  Musculoskeletal:  Negative for arthralgias and neck pain.  Neurological:  Negative for dizziness, weakness and headaches.   all other systems are negative except as noted in the HPI and PMH.    Physical Exam Updated Vital Signs BP 127/68   Pulse 86   Temp 97.7 F (36.5 C)  (Oral)   Resp 14   Ht 5\' 6"  (1.676 m)   Wt (!) 149.9 kg   SpO2 96%   BMI 53.33 kg/m  Physical Exam Vitals and nursing note reviewed.  Constitutional:      General: She is in acute distress.     Appearance: She is well-developed. She is obese. She is ill-appearing.     Comments: Dyspneic with conversation, speaking in short phrases  HENT:     Head: Normocephalic and atraumatic.     Mouth/Throat:     Pharynx: No oropharyngeal exudate.  Eyes:     Conjunctiva/sclera: Conjunctivae normal.     Pupils: Pupils are equal, round, and reactive to light.  Neck:     Comments: No meningismus. Cardiovascular:     Rate and Rhythm: Normal rate and regular rhythm.     Heart sounds: Normal heart sounds. No murmur heard. Pulmonary:     Effort: Pulmonary effort is normal. No respiratory distress.     Breath sounds: Wheezing present.     Comments: Expiratory wheezing bilaterally. Abdominal:     Palpations: Abdomen is soft.     Tenderness: There is no abdominal tenderness. There is no guarding or rebound.  Musculoskeletal:        General: No tenderness. Normal range of motion.     Cervical back: Normal range of motion and neck supple.     Right lower leg: Edema present.     Left lower leg: Edema present.     Comments: Pitting edema to thighs bilaterally.  Skin:    General: Skin is warm.  Neurological:     Mental Status: She is alert and oriented to person, place, and time.     Cranial Nerves: No cranial nerve deficit.     Motor: No abnormal muscle tone.     Coordination: Coordination normal.     Comments:  5/5 strength throughout. CN 2-12 intact.Equal grip strength.   Psychiatric:        Behavior: Behavior normal.     ED Results / Procedures / Treatments   Labs (all labs ordered are listed, but only abnormal results are displayed) Labs Reviewed  CBC WITH DIFFERENTIAL/PLATELET - Abnormal; Notable for the following components:      Result Value   RBC 5.78 (*)    MCV 73.5 (*)    MCH  22.5 (*)    RDW 15.7 (*)    Eosinophils Absolute 1.4 (*)    All other components within normal limits  BASIC METABOLIC PANEL - Abnormal; Notable for the following components:   Glucose, Bld 124 (*)    BUN 34 (*)    Creatinine, Ser 1.67 (*)    GFR, Estimated 33 (*)    All other components within normal limits  RESP PANEL BY RT-PCR (FLU A&B, COVID) ARPGX2  BRAIN NATRIURETIC PEPTIDE  D-DIMER, QUANTITATIVE  TROPONIN I (HIGH SENSITIVITY)  EKG EKG Interpretation  Date/Time:  Friday April 03 2022 16:43:59 EDT Ventricular Rate:  66 PR Interval:  160 QRS Duration: 120 QT Interval:  441 QTC Calculation: 463 R Axis:   -75 Text Interpretation: Sinus rhythm Left anterior fascicular block Lateral infarct, age indeterminate No significant change was found Confirmed by Ezequiel Essex [phone]) on 04/03/2022 7:53:06 PM  Radiology DG Chest Portable 1 View  Result Date: 04/03/2022 CLINICAL DATA:  Short of breath for 2-3 weeks, worsening over the last 3 days, history of non-small cell lung cancer EXAM: PORTABLE CHEST 1 VIEW COMPARISON:  11/20/2021, 12/17/2019 FINDINGS: Single frontal view of the chest demonstrates a stable cardiac silhouette. Stable right upper lobe scarring. No acute airspace disease, effusion, or pneumothorax. No acute bony abnormalities. Loop recorder left anterior chest unchanged. IMPRESSION: 1. Stable right upper lobe scarring.  No acute airspace disease. Electronically Signed   By: Randa Ngo M.D.   On: 04/03/2022 17:16    Procedures .Critical Care  Performed by: Ezequiel Essex, MD Authorized by: Ezequiel Essex, MD   Critical care provider statement:    Critical care time (minutes):  45   Critical care time was exclusive of:  Separately billable procedures and treating other patients   Critical care was necessary to treat or prevent imminent or life-threatening deterioration of the following conditions:  Respiratory failure   Critical care was time spent  personally by me on the following activities:  Development of treatment plan with patient or surrogate, discussions with consultants, evaluation of patient's response to treatment, examination of patient, ordering and review of laboratory studies, ordering and review of radiographic studies, ordering and performing treatments and interventions, pulse oximetry, re-evaluation of patient's condition, review of old charts and obtaining history from patient or surrogate   I assumed direction of critical care for this patient from another provider in my specialty: no       Medications Ordered in ED Medications  magnesium sulfate IVPB 2 g 50 mL (has no administration in time range)  albuterol (PROVENTIL) (2.5 MG/3ML) 0.083% nebulizer solution (has no administration in time range)  ipratropium (ATROVENT) nebulizer solution 0.5 mg (has no administration in time range)    ED Course/ Medical Decision Making/ A&P                           Medical Decision Making Amount and/or Complexity of Data Reviewed Labs: ordered. Decision-making details documented in ED Course. Radiology: ordered and independent interpretation performed. Decision-making details documented in ED Course. ECG/medicine tests: ordered and independent interpretation performed. Decision-making details documented in ED Course.  Risk OTC drugs. Prescription drug management.  4 to 5 days of difficulty breathing with increased cough, congestion and shortness of breath. History of COPD.  We will give bronchodilators, steroids, magnesium.  Chest x-ray is negative for infiltrate.  Results reviewed interpreted by me  Chest x-ray shows no infiltrate.  Patient was given bronchodilators, steroids, magnesium and multiple rounds of nebulizers.  Still remains wheezing.  New oxygen requirements with O2 saturations 83% on room air.  Will require admission for COPD exacerbation with new oxygen requirement.  No evidence of pneumonia.  D-dimer  elevated. CTPE pending.   Admission d/w Dr. Alcario Drought.        Final Clinical Impression(s) / ED Diagnoses Final diagnoses:  COPD exacerbation ([place])    Rx / DC Orders ED Discharge Orders     None         Lamontae Ricardo, Annie Main,  MD 04/03/22 2255

## 2022-04-03 NOTE — Assessment & Plan Note (Addendum)
Acute asthmatic exacerbation-patient with significant symptoms on arrival with diffuse wheezing and hypoxemia.  Patient was given albuterol treatment in the office without significant improvement.  Patient require hospitalization.  This is her third exacerbation in the last 2 months. EMS was contacted for transport to emergency room for further evaluation and consideration of hospitalization On return follow-up will need a more aggressive maintenance regimen-recommend triple therapy along with trigger prevention. EMS report given , patient in stable condition prior to transport   Plan  Patient Instructions  Transport to ER via EMS  '

## 2022-04-03 NOTE — ED Notes (Signed)
Urine sample collected and sent to the lab with patient's ID sticker

## 2022-04-03 NOTE — Progress Notes (Signed)
@Patient  ID: Pamela [name], female    DOB: 30-Jun-1951, 71 y.o.   MRN: [id number]  Chief Complaint  Patient presents with   Follow-up    Wheezing    Referring provider: Dorena Dew, FNP  HPI: 71 year old female never smoker followed for asthma with an allergic phenotype, sarcoidosis, history of lung cancer diagnosed in 2012 status post SBRT Medical history significant for right anterior chest wall mass/right pectoralis minor muscle mass (biopsy done 2015 nondiagnostic, PET scan October 2016 shows no change in metabolic activity or size suggestive of a benign etiology)  TEST/EVENTS :  CT chest Nov 20, 2021 similar posttreatment fibrosis/consolidation with volume loss in the right upper lobe.  No evidence of recurrence, tiny right upper lobe pulmonary nodule no suspicious pulmonary nodules.  A 5.5 x 2.1 cm soft tissue mass in the right pectoralis major and minor musculature.    pfts 07/31/13  FEV1  1.53 (58%) with ratio 66 > p saba ratio 74 FEV1 1.68 (64%)    FENO 06/01/2016  =   85    Spirometry 02/22/2018  FEV1 1.23 (57%)  Ratio 71 with min curvature  - FENO 02/22/2018  = 61   Split night sleep study in 07/2014 showed mild OSA only with AHI 6.9. O2 sats nml on 2l/m .    She also carries dx of Sarcoid from previous bx. (followed previously by Dr. Bartholome Bill at [facility], with lymphadenopathy in chest , abd w/ possible spleen and liver involvement.   04/03/2022 Acute OV : Asthma  Presents for an acute office visit.  Patient complains over the last 4 days that she has had increased dry cough and wheezing.  She has had significant shortness of breath and low oxygen levels at home.  Patient says she has had recurrent asthma exacerbations over the last 2 months.  She was initially seen 2 months ago by her primary care provider and given a prednisone taper.  She was seen here in the office 3 ago and given Augmentin x7 days for acute bronchitis.  Patient says her symptoms did improve  however 4 days ago she had severe wheezing.  She remains on Advair twice daily.  Today in the office she is very short of breath with significant amount of wheezing.  Oxygen level on arrival was 86% on room air.  Patient is not on oxygen at home. Patient denies any fever, body aches, chills, increased edema, nausea vomiting diarrhea or hemoptysis.   Allergies  Allergen Reactions   Sulfa Antibiotics Hives and Rash    Immunization History  Administered Date(s) Administered   Influenza,inj,Quad PF,6+ Mos 04/13/2014, 04/16/2015, 04/03/2016, 05/19/2017, 06/15/2018, 05/14/2019   PFIZER(Purple Top)SARS-COV-2 Vaccination 10/14/2019, 11/08/2019, 07/08/2020, 03/06/2021   Pneumococcal Conjugate-13 04/03/2016   Pneumococcal Polysaccharide-23 04/13/2014, 12/26/2019   Tdap 01/02/2015   Zoster, Live 05/21/2014    Past Medical History:  Diagnosis Date   Arthritis    Asthma    Cancer ([place])    lung, adenocarcinoma right lung 2012   CHF (congestive heart failure) (HCC)    Preserved EF   Congenital single kidney    With chronic kidney disease   COPD (chronic obstructive pulmonary disease) ([place])    Gout    Hypertension    Lung cancer ([place]) 2012   Right upper lobe lung adenocarcinoma diagnosed with needle biopsy treated by SBRT finished treatment April 2013 has been monitored since   Mass of chest wall, right    Right chest wall mass 7.3 cm biopsy  on 12/13/2013. Patient notes it was consistent with sarcoidosis but actual pathology results not available.   Oxygen deficiency    Sarcoidosis    Sickle cell trait (HCC)    Sleep apnea     Tobacco History: Social History   Tobacco Use  Smoking Status Never  Smokeless Tobacco Never   Counseling given: Not Answered   Outpatient Medications Prior to Visit  Medication Sig Dispense Refill   albuterol (PROAIR HFA) 108 (90 Base) MCG/ACT inhaler Inhale 2 puffs into the lungs every 4 (four) hours as needed for wheezing or shortness of breath. 8 g 3    albuterol (PROVENTIL) (2.5 MG/3ML) 0.083% nebulizer solution USE 1 VIAL IN NEBULIZER EVERY 6 HOURS AS NEEDED FOR WHEEZING FOR SHORTNESS OF BREATH 300 mL 2   acetaminophen (TYLENOL) 500 MG tablet Take 1,000 mg by mouth every 6 (six) hours as needed for mild pain.      amoxicillin-clavulanate (AUGMENTIN) 875-125 MG tablet Take 1 tablet by mouth 2 (two) times daily. 14 tablet 0   aspirin EC 81 MG tablet Take 1 tablet (81 mg total) by mouth daily. Swallow whole. 30 tablet 11   atorvastatin (LIPITOR) 40 MG tablet Take 1 tablet by mouth once daily (Patient not taking: Reported on 03/13/2022) 90 tablet 0   calcitRIOL (ROCALTROL) 0.25 MCG capsule Take 1 capsule (0.25 mcg total) by mouth daily. 90 capsule 0   docusate sodium (COLACE) 100 MG capsule Take 1 capsule (100 mg total) by mouth 2 (two) times daily. 60 capsule 0   febuxostat (ULORIC) 40 MG tablet Take 1 tablet (40 mg total) by mouth daily. 90 tablet 1   Fluticasone-Salmeterol 55-14 MCG/ACT AEPB Take 2 puffs first thing in am and then another 2 puffs about 12 hours later. 1 each 11   furosemide (LASIX) 40 MG tablet Take 1 tablet (40 mg total) by mouth 3 (three) times daily. 90 tablet 2   potassium chloride SA (KLOR-CON M) 20 MEQ tablet TAKE 1  BY MOUTH ONCE DAILY 90 tablet 1   Vitamin D, Ergocalciferol, (DRISDOL) 1.25 MG (50000 UNIT) CAPS capsule Take 1 capsule (50,000 Units total) by mouth every 7 (seven) days. (Patient not taking: Reported on 03/13/2022) 5 capsule 0   No facility-administered medications prior to visit.     Review of Systems:   Constitutional:   No  weight loss, night sweats,  Fevers, chills, fatigue, or  lassitude.  HEENT:   No headaches,  Difficulty swallowing,  Tooth/dental problems, or  Sore throat,                No sneezing, itching, ear ache, + nasal congestion, post nasal drip,   CV:  No chest pain,  Orthopnea, PND, swelling in lower extremities, anasarca, dizziness, palpitations, syncope.   GI  No heartburn,  indigestion, abdominal pain, nausea, vomiting, diarrhea, change in bowel habits, loss of appetite, bloody stools.   Resp:   No chest wall deformity  Skin: no rash or lesions.  GU: no dysuria, change in color of urine, no urgency or frequency.  No flank pain, no hematuria   MS:  No joint pain or swelling.  No decreased range of motion.  No back pain.    Physical Exam  BP 120/80 (BP Location: Left Arm)   Pulse 75   Ht 5\' 6"  (1.676 m)   Wt (!) 330 lb 6.4 oz (149.9 kg)   SpO2 (!) 86%   BMI 53.33 kg/m   GEN: A/Ox3; pleasant , NAD, BMI  53   HEENT:  [place]/AT,  EACs-clear, TMs-wnl, NOSE-clear, THROAT-clear, no lesions, no postnasal drip or exudate noted.   NECK:  Supple w/ fair ROM; no JVD; normal carotid impulses w/o bruits; no thyromegaly or nodules palpated; no lymphadenopathy.    RESP expiratory wheezes bilaterally positive accessory muscle use  no dullness to percussion  CARD:  RRR, no m/r/g, tr peripheral edema, pulses intact, no cyanosis or clubbing.  GI:   Soft & nt; nml bowel sounds; no organomegaly or masses detected.   Musco: Warm bil, no deformities or joint swelling noted.   Neuro: alert, no focal deficits noted.    Skin: Warm, no lesions or rashes    Lab Results:  CBC    Component Value Date/Time   WBC 5.6 12/12/2021 0848   WBC 4.7 12/13/2020 0954   RBC 5.06 12/12/2021 0848   HGB 11.2 (L) 12/12/2021 0848   HGB 11.0 (L) 08/29/2019 0941   HGB 11.8 05/31/2017 1050   HCT 37.1 01/29/2022 0940   HCT 37.5 05/31/2017 1050   PLT 150 12/12/2021 0848   PLT 195 08/29/2019 0941   MCV 70.2 (L) 12/12/2021 0848   MCV 72 (L) 08/29/2019 0941   MCV 71.2 (L) 05/31/2017 1050   MCH 22.1 (L) 12/12/2021 0848   MCHC 31.5 12/12/2021 0848   RDW 15.3 12/12/2021 0848   RDW 16.2 (H) 08/29/2019 0941   RDW 15.1 (H) 05/31/2017 1050   LYMPHSABS 1.3 12/12/2021 0848   LYMPHSABS 1.0 08/29/2019 0941   LYMPHSABS 1.2 05/31/2017 1050   MONOABS 0.5 12/12/2021 0848   MONOABS 0.4  05/31/2017 1050   EOSABS 0.7 (H) 12/12/2021 0848   EOSABS 1.0 (H) 08/29/2019 0941   BASOSABS 0.0 12/12/2021 0848   BASOSABS 0.0 08/29/2019 0941   BASOSABS 0.0 05/31/2017 1050    BMET    Component Value Date/Time   NA 142 01/29/2022 0940   NA 142 05/31/2017 1050   K 4.1 01/29/2022 0940   K 4.2 05/31/2017 1050   CL 99 01/29/2022 0940   CL 101 09/20/2018 0000   CO2 26 01/29/2022 0940   CO2 27 09/20/2018 0000   CO2 30 (H) 05/31/2017 1050   GLUCOSE 79 01/29/2022 0940   GLUCOSE 103 (H) 12/12/2021 0848   GLUCOSE 94 05/31/2017 1050   BUN 23 01/29/2022 0940   BUN 26.0 05/31/2017 1050   CREATININE 1.62 (H) 01/29/2022 0940   CREATININE 1.89 (H) 12/12/2021 0848   CREATININE 1.6 (H) 05/31/2017 1050   CALCIUM 9.4 01/29/2022 0940   CALCIUM 8.2 09/20/2018 0000   CALCIUM 9.0 05/31/2017 1050   GFRNONAA 28 (L) 12/12/2021 0848   GFRNONAA 36 (L) 05/19/2017 1210   GFRAA 20 (L) 04/26/2020 1011   GFRAA 37 (L) 06/23/2018 1300   GFRAA 41 (L) 05/19/2017 1210    BNP    Component Value Date/Time   BNP 27.5 12/17/2019 1009   BNP 65.7 08/31/2014 0937    ProBNP    Component Value Date/Time   PROBNP 494 (H) 04/26/2020 1011   PROBNP 1,193.0 (H) 06/11/2014 1929    Imaging: CUP PACEART REMOTE DEVICE CHECK  Result Date: 03/31/2022 ILR summary report received. Battery status OK. Normal device function. No new symptom, tachy, brady, or pause episodes. One previously viewed AF episode. AF burden is 0% of the time.  Monthly summary reports and ROV/PRN Kathy Breach, RN, CCDS, [employer]   methylPREDNISolone sodium succinate (SOLU-MEDROL) 125 mg/2 mL injection 80 mg     Date Action Dose Route  User   02/03/2022 1026 Given 80 mg Intramuscular (Left Deltoid) Schuyler Amor, CMA          Latest Ref Rng & Units 08/30/2018   10:03 AM 07/19/2014   11:01 AM  PFT Results  FVC-Pre L 1.92  2.32   FVC-Predicted Pre % 69  85   FVC-Post L 2.13  2.36   FVC-Predicted Post % 77  86   Pre  FEV1/FVC % % 83  71   Post FEV1/FCV % % 79  85   FEV1-Pre L 1.59  1.64   FEV1-Predicted Pre % 74  76   FEV1-Post L 1.69  2.00   DLCO uncorrected ml/min/mmHg 23.90  18.43   DLCO UNC% % 112  71   DLVA Predicted % 154  101     Lab Results  Component Value Date   NITRICOXIDE 56 08/30/2018        Assessment & Plan:   Asthma, chronic obstructive, with acute exacerbation (HCC) Acute asthmatic exacerbation-patient with significant symptoms on arrival with diffuse wheezing and hypoxemia.  Patient was given albuterol treatment in the office without significant improvement.  Patient require hospitalization.  This is her third exacerbation in the last 2 months. EMS was contacted for transport to emergency room for further evaluation and consideration of hospitalization On return follow-up will need a more aggressive maintenance regimen-recommend triple therapy along with trigger prevention. EMS report given , patient in stable condition prior to transport   Plan  Patient Instructions  Transport to ER via EMS  '     Rexene Edison, NP 04/03/2022

## 2022-04-03 NOTE — Patient Instructions (Signed)
Transport to ER via EMS

## 2022-04-04 ENCOUNTER — Emergency Department (HOSPITAL_COMMUNITY): Payer: Medicare Other

## 2022-04-04 ENCOUNTER — Encounter (HOSPITAL_COMMUNITY): Payer: Self-pay

## 2022-04-04 DIAGNOSIS — I5032 Chronic diastolic (congestive) heart failure: Secondary | ICD-10-CM | POA: Diagnosis not present

## 2022-04-04 DIAGNOSIS — I130 Hypertensive heart and chronic kidney disease with heart failure and stage 1 through stage 4 chronic kidney disease, or unspecified chronic kidney disease: Secondary | ICD-10-CM | POA: Diagnosis present

## 2022-04-04 DIAGNOSIS — J9601 Acute respiratory failure with hypoxia: Secondary | ICD-10-CM | POA: Diagnosis not present

## 2022-04-04 DIAGNOSIS — Z79899 Other long term (current) drug therapy: Secondary | ICD-10-CM | POA: Diagnosis not present

## 2022-04-04 DIAGNOSIS — M109 Gout, unspecified: Secondary | ICD-10-CM | POA: Diagnosis present

## 2022-04-04 DIAGNOSIS — Z20822 Contact with and (suspected) exposure to covid-19: Secondary | ICD-10-CM | POA: Diagnosis present

## 2022-04-04 DIAGNOSIS — I890 Lymphedema, not elsewhere classified: Secondary | ICD-10-CM | POA: Diagnosis present

## 2022-04-04 DIAGNOSIS — Z841 Family history of disorders of kidney and ureter: Secondary | ICD-10-CM | POA: Diagnosis not present

## 2022-04-04 DIAGNOSIS — Z95818 Presence of other cardiac implants and grafts: Secondary | ICD-10-CM | POA: Diagnosis not present

## 2022-04-04 DIAGNOSIS — G4733 Obstructive sleep apnea (adult) (pediatric): Secondary | ICD-10-CM | POA: Diagnosis present

## 2022-04-04 DIAGNOSIS — Z6841 Body Mass Index (BMI) 40.0 and over, adult: Secondary | ICD-10-CM | POA: Diagnosis not present

## 2022-04-04 DIAGNOSIS — R0902 Hypoxemia: Secondary | ICD-10-CM | POA: Diagnosis not present

## 2022-04-04 DIAGNOSIS — R059 Cough, unspecified: Secondary | ICD-10-CM | POA: Diagnosis not present

## 2022-04-04 DIAGNOSIS — Z882 Allergy status to sulfonamides status: Secondary | ICD-10-CM | POA: Diagnosis not present

## 2022-04-04 DIAGNOSIS — D509 Iron deficiency anemia, unspecified: Secondary | ICD-10-CM | POA: Diagnosis present

## 2022-04-04 DIAGNOSIS — D573 Sickle-cell trait: Secondary | ICD-10-CM | POA: Diagnosis present

## 2022-04-04 DIAGNOSIS — N1832 Chronic kidney disease, stage 3b: Secondary | ICD-10-CM

## 2022-04-04 DIAGNOSIS — Z85118 Personal history of other malignant neoplasm of bronchus and lung: Secondary | ICD-10-CM | POA: Diagnosis not present

## 2022-04-04 DIAGNOSIS — Z7722 Contact with and (suspected) exposure to environmental tobacco smoke (acute) (chronic): Secondary | ICD-10-CM | POA: Diagnosis present

## 2022-04-04 DIAGNOSIS — R0602 Shortness of breath: Secondary | ICD-10-CM | POA: Diagnosis not present

## 2022-04-04 DIAGNOSIS — J441 Chronic obstructive pulmonary disease with (acute) exacerbation: Secondary | ICD-10-CM | POA: Diagnosis not present

## 2022-04-04 DIAGNOSIS — N179 Acute kidney failure, unspecified: Secondary | ICD-10-CM | POA: Diagnosis present

## 2022-04-04 DIAGNOSIS — D860 Sarcoidosis of lung: Secondary | ICD-10-CM | POA: Diagnosis present

## 2022-04-04 DIAGNOSIS — Z8249 Family history of ischemic heart disease and other diseases of the circulatory system: Secondary | ICD-10-CM | POA: Diagnosis not present

## 2022-04-04 DIAGNOSIS — E875 Hyperkalemia: Secondary | ICD-10-CM | POA: Diagnosis present

## 2022-04-04 DIAGNOSIS — J45901 Unspecified asthma with (acute) exacerbation: Secondary | ICD-10-CM | POA: Diagnosis present

## 2022-04-04 LAB — CBC
HCT: 39.7 % (ref 36.0–46.0)
Hemoglobin: 12.2 g/dL (ref 12.0–15.0)
MCH: 22.5 pg — ABNORMAL LOW (ref 26.0–34.0)
MCHC: 30.7 g/dL (ref 30.0–36.0)
MCV: 73.1 fL — ABNORMAL LOW (ref 80.0–100.0)
Platelets: 162 10*3/uL (ref 150–400)
RBC: 5.43 MIL/uL — ABNORMAL HIGH (ref 3.87–5.11)
RDW: 15.4 % (ref 11.5–15.5)
WBC: 4.2 10*3/uL (ref 4.0–10.5)
nRBC: 0 % (ref 0.0–0.2)

## 2022-04-04 LAB — COMPREHENSIVE METABOLIC PANEL
ALT: 14 U/L (ref 0–44)
AST: 21 U/L (ref 15–41)
Albumin: 3.6 g/dL (ref 3.5–5.0)
Alkaline Phosphatase: 151 U/L — ABNORMAL HIGH (ref 38–126)
Anion gap: 10 (ref 5–15)
BUN: 35 mg/dL — ABNORMAL HIGH (ref 8–23)
CO2: 30 mmol/L (ref 22–32)
Calcium: 9.2 mg/dL (ref 8.9–10.3)
Chloride: 103 mmol/L (ref 98–111)
Creatinine, Ser: 1.75 mg/dL — ABNORMAL HIGH (ref 0.44–1.00)
GFR, Estimated: 31 mL/min — ABNORMAL LOW (ref >60)
Glucose, Bld: 199 mg/dL — ABNORMAL HIGH (ref 70–99)
Potassium: 4.7 mmol/L (ref 3.5–5.1)
Sodium: 143 mmol/L (ref 135–145)
Total Bilirubin: 0.4 mg/dL (ref 0.3–1.2)
Total Protein: 7 g/dL (ref 6.5–8.1)

## 2022-04-04 LAB — HIV ANTIBODY (ROUTINE TESTING W REFLEX): HIV Screen 4th Generation wRfx: NONREACTIVE

## 2022-04-04 MED ORDER — METHYLPREDNISOLONE SODIUM SUCC 40 MG IJ SOLR
40.0000 mg | Freq: Two times a day (BID) | INTRAMUSCULAR | Status: DC
  Administered 2022-04-04 – 2022-04-08 (×9): 40 mg via INTRAVENOUS
  Filled 2022-04-04 (×9): qty 1

## 2022-04-04 MED ORDER — DOCUSATE SODIUM 100 MG PO CAPS
100.0000 mg | ORAL_CAPSULE | Freq: Two times a day (BID) | ORAL | Status: DC
  Administered 2022-04-04 – 2022-04-08 (×10): 100 mg via ORAL
  Filled 2022-04-04 (×10): qty 1

## 2022-04-04 MED ORDER — MOMETASONE FURO-FORMOTEROL FUM 200-5 MCG/ACT IN AERO
2.0000 | INHALATION_SPRAY | Freq: Two times a day (BID) | RESPIRATORY_TRACT | Status: DC
  Administered 2022-04-04: 2 via RESPIRATORY_TRACT
  Filled 2022-04-04: qty 8.8

## 2022-04-04 MED ORDER — FEBUXOSTAT 40 MG PO TABS
40.0000 mg | ORAL_TABLET | Freq: Every day | ORAL | Status: DC
  Administered 2022-04-04 – 2022-04-08 (×5): 40 mg via ORAL
  Filled 2022-04-04 (×5): qty 1

## 2022-04-04 MED ORDER — UMECLIDINIUM BROMIDE 62.5 MCG/ACT IN AEPB
1.0000 | INHALATION_SPRAY | Freq: Every day | RESPIRATORY_TRACT | Status: DC
  Administered 2022-04-04: 1 via RESPIRATORY_TRACT
  Filled 2022-04-04: qty 7

## 2022-04-04 MED ORDER — BUDESONIDE 0.25 MG/2ML IN SUSP
0.2500 mg | Freq: Two times a day (BID) | RESPIRATORY_TRACT | Status: DC
  Administered 2022-04-04 – 2022-04-08 (×9): 0.25 mg via RESPIRATORY_TRACT
  Filled 2022-04-04 (×9): qty 2

## 2022-04-04 MED ORDER — FUROSEMIDE 40 MG PO TABS
40.0000 mg | ORAL_TABLET | Freq: Three times a day (TID) | ORAL | Status: DC
  Administered 2022-04-04 – 2022-04-05 (×6): 40 mg via ORAL
  Filled 2022-04-04 (×6): qty 1

## 2022-04-04 MED ORDER — IPRATROPIUM-ALBUTEROL 0.5-2.5 (3) MG/3ML IN SOLN
3.0000 mL | Freq: Four times a day (QID) | RESPIRATORY_TRACT | Status: DC
  Administered 2022-04-04 – 2022-04-07 (×13): 3 mL via RESPIRATORY_TRACT
  Filled 2022-04-04 (×13): qty 3

## 2022-04-04 MED ORDER — ALBUTEROL SULFATE (2.5 MG/3ML) 0.083% IN NEBU: 2.5000 mg | INHALATION_SOLUTION | RESPIRATORY_TRACT | Status: DC | PRN

## 2022-04-04 MED ORDER — ENOXAPARIN SODIUM 40 MG/0.4ML IJ SOSY
40.0000 mg | PREFILLED_SYRINGE | INTRAMUSCULAR | Status: DC
  Administered 2022-04-04 – 2022-04-08 (×5): 40 mg via SUBCUTANEOUS
  Filled 2022-04-04 (×5): qty 0.4

## 2022-04-04 MED ORDER — CALCITRIOL 0.25 MCG PO CAPS
0.2500 ug | ORAL_CAPSULE | Freq: Every day | ORAL | Status: DC
  Administered 2022-04-04 – 2022-04-08 (×5): 0.25 ug via ORAL
  Filled 2022-04-04 (×5): qty 1

## 2022-04-04 MED ORDER — SODIUM CHLORIDE 0.9 % IV SOLN
1.0000 g | INTRAVENOUS | Status: AC
  Administered 2022-04-04 – 2022-04-08 (×5): 1 g via INTRAVENOUS
  Filled 2022-04-04 (×5): qty 10

## 2022-04-04 MED ORDER — IOHEXOL 350 MG/ML SOLN
75.0000 mL | Freq: Once | INTRAVENOUS | Status: AC | PRN
  Administered 2022-04-04: 75 mL via INTRAVENOUS

## 2022-04-04 MED ORDER — ASPIRIN 81 MG PO TBEC
81.0000 mg | DELAYED_RELEASE_TABLET | Freq: Every day | ORAL | Status: DC
  Administered 2022-04-04 – 2022-04-08 (×5): 81 mg via ORAL
  Filled 2022-04-04 (×5): qty 1

## 2022-04-04 MED ORDER — BENZONATATE 100 MG PO CAPS
200.0000 mg | ORAL_CAPSULE | Freq: Three times a day (TID) | ORAL | Status: DC
  Administered 2022-04-04 – 2022-04-08 (×14): 200 mg via ORAL
  Filled 2022-04-04 (×14): qty 2

## 2022-04-04 MED ORDER — ARFORMOTEROL TARTRATE 15 MCG/2ML IN NEBU
15.0000 ug | INHALATION_SOLUTION | Freq: Two times a day (BID) | RESPIRATORY_TRACT | Status: DC
  Administered 2022-04-04 – 2022-04-08 (×9): 15 ug via RESPIRATORY_TRACT
  Filled 2022-04-04 (×9): qty 2

## 2022-04-04 MED ORDER — POTASSIUM CHLORIDE CRYS ER 20 MEQ PO TBCR
20.0000 meq | EXTENDED_RELEASE_TABLET | Freq: Every day | ORAL | Status: DC
  Administered 2022-04-04 – 2022-04-06 (×3): 20 meq via ORAL
  Filled 2022-04-04 (×3): qty 1

## 2022-04-04 MED ORDER — BENZONATATE 100 MG PO CAPS
200.0000 mg | ORAL_CAPSULE | Freq: Three times a day (TID) | ORAL | Status: DC | PRN
  Administered 2022-04-04: 200 mg via ORAL
  Filled 2022-04-04: qty 2

## 2022-04-04 NOTE — Assessment & Plan Note (Addendum)
Long term remission following SBRT in 2012. Follows once a year with heme/onc. Neg CTA chest today.

## 2022-04-04 NOTE — Progress Notes (Signed)
Pt has arrived to IP room 1518. Pt alert and oriented x4 ambulated from bed to bed 2L O2 administered.

## 2022-04-04 NOTE — Assessment & Plan Note (Addendum)
Pt pretty clear that current degree of leg swelling is her baseline. BNP nl today And no pulm edema. Dont think this is significantly contributing to presentation / respiratory symptoms today. 1. Cont home lasix

## 2022-04-04 NOTE — Assessment & Plan Note (Addendum)
1. COPD pathway 2. IV solumedrol BID 3. Scheduled LABA/LAMA and INH steroid 4. PRN SABA 5. Adult wheeze protocol 6. Cough suppression with tessalon

## 2022-04-04 NOTE — Assessment & Plan Note (Signed)
Secondary to #1 above Not chronically on O2 since 2021. Improved on O2 via [place] today. 1. O2 via [place] 2. Continuous pulse ox

## 2022-04-04 NOTE — Evaluation (Signed)
Occupational Therapy Evaluation Patient Details Name: Pamela [name] MRN: [id number] DOB: 11-03-1950 Today's Date: 04/04/2022   History of Present Illness Pamela [name] is a 71 y.o. female with medical history significant of HFpEF, COPD, sarcoidosis, NSCLC s/p SBRT 2012 -> long term remission, HTN, second hand smoke exposure in past (no longer), OSA.     Pt sent in to ED 04/03/22  from pulm clinic with SOB, hypoxia   Clinical Impression   Patient is a 71 year old female who was admitted for above. Patient was living at home with family and independence in [place] prior level. Patient was noted to have increased need for O2 with SOB noted with minimal activity. Patient was 87% on 2L after activity with increased SOB noted. Patient was noted to have decreased functional activity tolerance, decreased endurance, decreased standing balance, decreased safety awareness, and decreased knowledge of AD/AE impacting participation in ADLs.anticipate that patient will be able to progress during acute stay to not need OT follow up in next level of care.  Patient would continue to benefit from skilled OT services at this time while admitted to address noted deficits in order to improve overall safety and independence in ADLs.        Recommendations for follow up therapy are one component of a multi-disciplinary discharge planning process, led by the attending physician.  Recommendations may be updated based on patient status, additional functional criteria and insurance authorization.   Follow Up Recommendations  No OT follow up    Assistance Recommended at Discharge Intermittent Supervision/Assistance  Patient can return home with the following A little help with bathing/dressing/bathroom;Assistance with cooking/housework;Assist for transportation;Help with stairs or ramp for entrance;Direct supervision/assist for medications management    Functional Status Assessment  Patient has had a recent decline in  their functional status and demonstrates the ability to make significant improvements in function in a reasonable and predictable amount of time.  Equipment Recommendations  None recommended by OT    Recommendations for Other Services       Precautions / Restrictions Precautions Precautions: Fall Precaution Comments: monitor sats Restrictions Weight Bearing Restrictions: No      Mobility Bed Mobility Overal bed mobility: Needs Assistance Bed Mobility: Supine to Sit     Supine to sit: Min assist     General bed mobility comments: gentle hand support to lift trunk    Transfers Overall transfer level: Needs assistance Equipment used: Straight cane Transfers: Sit to/from Stand Sit to Stand: Min guard, From elevated surface                  Balance Overall balance assessment: Mild deficits observed, not formally tested                                         ADL either performed or assessed with clinical judgement   ADL Overall ADL's : Needs assistance/impaired Eating/Feeding: Set up;Sitting   Grooming: Set up;Sitting   Upper Body Bathing: Set up;Sitting   Lower Body Bathing: Minimal assistance;Sit to/from stand;Sitting/lateral leans   Upper Body Dressing : Set up;Sitting   Lower Body Dressing: Sit to/from stand;Min guard;Moderate assistance Lower Body Dressing Details (indicate cue type and reason): patient was unable to keep LLE on lap to doff sock with multiple attempts and lifts onto lap to complete task. patient was educated on reacher and sock aid for LB Dressing tasks. patient reported she  had been to rehab before and she had them at home. patient verbally educated this therapist on how to use items at this time. Toilet Transfer: Minimal Print production planner Details (indicate cue type and reason): with cane with increased time and safety for lines. Toileting- Clothing Manipulation and Hygiene: Minimal assistance;Sit to/from  stand Toileting - Clothing Manipulation Details (indicate cue type and reason): patient is able to reach perineal area posteiroly in standing with no UE support without LOB. declined to actually use bathroom at this time stating she did not need to go             Vision Patient Visual Report: No change from baseline       Perception     Praxis      Pertinent Vitals/Pain Pain Assessment Pain Assessment: No/denies pain     Hand Dominance Left   Extremity/Trunk Assessment Upper Extremity Assessment Upper Extremity Assessment: Overall WFL for tasks assessed (with some soft tissue limitations)   Lower Extremity Assessment Lower Extremity Assessment: Defer to PT evaluation   Cervical / Trunk Assessment Cervical / Trunk Assessment: Normal;Other exceptions Cervical / Trunk Exceptions: body habitus,   Communication Communication Communication: No difficulties   Cognition Arousal/Alertness: Awake/alert Behavior During Therapy: WFL for tasks assessed/performed Overall Cognitive Status: Within Functional Limits for tasks assessed                                       General Comments       Exercises     Shoulder Instructions      Home Living Family/patient expects to be discharged to:: Private residence Living Arrangements: Children Available Help at Discharge: Available 24 hours/day Type of Home: House Home Access: Stairs to enter Technical brewer of Steps: 1 Entrance Stairs-Rails: Right;Left Home Layout: Able to live on main level with bedroom/bathroom     Bathroom Shower/Tub: Occupational psychologist: Handicapped height     Home Equipment: [place] - single Barista (2 wheels)   Additional Comments: son and familuy live on second floor, goes up  16 steps to  do laundry 1 x week,  grand kids take  it up and down steps for  her      Prior Functioning/Environment Prior Level of Function : Independent/Modified  Independent             Mobility Comments: does not drive, has a lift chair, sleeps in a bed, no trouble getting in and out ADLs Comments: IADL's        OT Problem List: Obesity;Cardiopulmonary status limiting activity;Decreased knowledge of use of DME or AE;Decreased safety awareness      OT Treatment/Interventions: Self-care/ADL training;DME and/or AE instruction;Therapeutic activities;Patient/family education;Balance training    OT Goals(Current goals can be found in the care plan section) Acute Rehab OT Goals Patient Stated Goal: to get home soon OT Goal Formulation: With patient Time For Goal Achievement: 04/18/22 Potential to Achieve Goals: Fair  OT Frequency: Min 2X/week    Co-evaluation              AM-PAC OT "6 Clicks" Daily Activity     Outcome Measure Help from another person eating meals?: None Help from another person taking care of personal grooming?: None Help from another person toileting, which includes using toliet, bedpan, or urinal?: A Little Help from another person bathing (including washing, rinsing, drying)?: A Lot Help from another  person to put on and taking off regular upper body clothing?: A Little Help from another person to put on and taking off regular lower body clothing?: A Lot 6 Click Score: 18   End of Session Equipment Utilized During Treatment: Other (comment) (personal cane) Nurse Communication: Other (comment) (ok to participate in session)  Activity Tolerance: Patient tolerated treatment well Patient left: in bed;with call bell/phone within reach  OT Visit Diagnosis: Unsteadiness on feet (R26.81);Other abnormalities of gait and mobility (R26.89);Muscle weakness (generalized) (M62.81)                Time: 7062-3762 OT Time Calculation (min): 14 min Charges:  OT General Charges $OT Visit: 1 Visit OT Evaluation $OT Eval Low Complexity: 1 Low  Nalaysia Manganiello OTR/L, MS Acute Rehabilitation Department Office# [phone]   Marcellina Millin 04/04/2022, 4:14 PM

## 2022-04-04 NOTE — H&P (Signed)
History and Physical    Patient: Pamela [name] PIR:[id number] DOB: 14-May-1951 DOA: 04/03/2022 DOS: the patient was seen and examined on 04/04/2022 PCP: Dorena Dew, FNP  Patient coming from: Home  Chief Complaint:  Chief Complaint  Patient presents with   Shortness of Breath   HPI: Pamela [name] is a 71 y.o. female with medical history significant of HFpEF, COPD, sarcoidosis, NSCLC s/p SBRT 2012 -> long term remission, HTN, second hand smoke exposure in past (no longer), OSA.  Pt sent in to ED from pulm clinic with SOB, hypoxia.  States she has had difficulty breathing since July and been treated multiple times with antibiotics and steroids.  She completed her last course about a week ago.  She was hypoxic at her pulmonary clinic and sent to the ER.  Still having cough, congestion, shortness of breath for the past 4 to 5 days.  Cough productive of green and yellow mucus.  Denies chest pain.  Denies change in her chronic leg swelling.  Denies abdominal pain, nausea or vomiting.  States compliance with her Lasix and nebulizers at home.  Review of Systems: As mentioned in the history of present illness. All other systems reviewed and are negative. Past Medical History:  Diagnosis Date   Arthritis    Asthma    Cancer ([place])    lung, adenocarcinoma right lung 2012   CHF (congestive heart failure) (HCC)    Preserved EF   Congenital single kidney    With chronic kidney disease   COPD (chronic obstructive pulmonary disease) ([place])    Gout    Hypertension    Lung cancer ([place]) 2012   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 Patient notes it was consistent with sarcoidosis but actual pathology results not available.   Oxygen deficiency    Sarcoidosis    Sickle cell trait ([place])    Sleep apnea    Past  Surgical History:  Procedure Laterality Date   LOOP RECORDER INSERTION N/A 09/08/2018   Procedure: LOOP RECORDER INSERTION;  Surgeon: Evans Lance, MD;  Location: [facility];  Service: Cardiovascular;  Laterality: N/A;   LUNG BIOPSY     TEE WITHOUT CARDIOVERSION N/A 09/08/2018   Procedure: TRANSESOPHAGEAL ECHOCARDIOGRAM (TEE);  Surgeon: Josue Hector, MD;  Location: [facility] ENDOSCOPY;  Service: Cardiovascular;  Laterality: N/A;  with loop   TUBAL LIGATION     VEIN LIGATION AND STRIPPING     Social History:  reports that she has never smoked. She has never used smokeless tobacco. She reports that she does not drink alcohol and does not use drugs.  Allergies  Allergen Reactions   Sulfa Antibiotics Hives and Rash    Family History  Problem Relation Age of Onset   Hypertension Mother    Renal Disease Mother    High blood pressure Mother    Heart disease Mother    Cancer Father        stomach   Heart disease Father        No details   Cervical cancer Sister    Diabetes Sister    Multiple myeloma Sister    Cancer Brother    Diabetes Brother     Prior to Admission medications   Medication Sig Start Date End Date Taking? Authorizing Provider  acetaminophen (TYLENOL)  500 MG tablet Take 1,000 mg by mouth every 6 (six) hours as needed for mild pain.    Yes [provider]  albuterol (PROAIR HFA) 108 (90 Base) MCG/ACT inhaler Inhale 2 puffs into the lungs every 4 (four) hours as needed for wheezing or shortness of breath. 02/03/22  Yes Dorena Dew, FNP  albuterol (PROVENTIL) (2.5 MG/3ML) 0.083% nebulizer solution USE 1 VIAL IN NEBULIZER EVERY 6 HOURS AS NEEDED FOR WHEEZING FOR SHORTNESS OF BREATH 09/11/21  Yes Dorena Dew, FNP  aspirin EC 81 MG tablet Take 1 tablet (81 mg total) by mouth daily. Swallow whole. 04/26/20  Yes Chandrasekhar, Mahesh A, MD  calcitRIOL (ROCALTROL) 0.25 MCG capsule Take 1 capsule (0.25 mcg total) by mouth daily. 02/19/22  Yes Dorena Dew, FNP  docusate sodium (STOOL SOFTENER) 100 MG capsule Take 100 mg by mouth 2 (two) times daily.   Yes [provider]  febuxostat (ULORIC) 40 MG tablet Take 1 tablet (40 mg total) by mouth daily. 02/19/22  Yes Dorena Dew, FNP  Ferrous Sulfate (IRON PO) Take 1 tablet by mouth daily.   Yes [provider]  Fluticasone-Salmeterol 55-14 MCG/ACT AEPB Take 2 puffs first thing in am and then another 2 puffs about 12 hours later. 02/03/22  Yes Dorena Dew, FNP  furosemide (LASIX) 40 MG tablet Take 1 tablet (40 mg total) by mouth 3 (three) times daily. 02/25/22  Yes Dorena Dew, FNP  potassium chloride SA (KLOR-CON M) 20 MEQ tablet TAKE 1  BY MOUTH ONCE DAILY 02/19/22  Yes Dorena Dew, FNP  Vitamin D, Ergocalciferol, (DRISDOL) 1.25 MG (50000 UNIT) CAPS capsule Take 1 capsule (50,000 Units total) by mouth every 7 (seven) days. 03/10/22  Yes Everardo Pacific, FNP    Physical Exam: Vitals:   04/04/22 0200 04/04/22 0215 04/04/22 0222 04/04/22 0230  BP: 122/70 131/67  124/75  Pulse: 96 95  91  Resp: '15 20  18  ' Temp:   (!) 97.5 F (36.4 C)   TempSrc:   Oral   SpO2: 94% 94%  97%  Weight:      Height:       Constitutional: NAD, calm, comfortable Eyes: PERRL, lids and conjunctivae normal ENMT: Mucous membranes are moist. Posterior pharynx clear of any exudate or lesions.Normal dentition.  Neck: normal, supple, no masses, no thyromegaly Respiratory: Diffuse wheezing still, and very loud bronchitic sounding cough. Cardiovascular: Regular rate and rhythm, no murmurs / rubs / gallops. No extremity edema. 2+ pedal pulses. No carotid bruits.  Abdomen: no tenderness, no masses palpated. No hepatosplenomegaly. Bowel sounds positive.  Musculoskeletal: no clubbing / cyanosis. No joint deformity upper and lower extremities. Good ROM, no contractures. Normal muscle tone.  Skin: no rashes, lesions, ulcers. No induration Neurologic: CN 2-12 grossly intact. Sensation intact,  DTR normal. Strength 5/5 in all 4.  Psychiatric: Normal judgment and insight. Alert and oriented x 3. Normal mood.   Data Reviewed:       Latest Ref Rng & Units 04/04/2022    4:25 AM 04/03/2022    6:00 PM 01/29/2022    9:40 AM  CMP  Glucose 70 - 99 mg/dL 199  124  79   BUN 8 - 23 mg/dL 35  34  23   Creatinine 0.44 - 1.00 mg/dL 1.75  1.67  1.62   Sodium 135 - 145 mmol/L 143  142  142   Potassium 3.5 - 5.1 mmol/L 4.7  4.2  4.1   Chloride  98 - 111 mmol/L 103  102  99   CO2 22 - 32 mmol/L '30  29  26   ' Calcium 8.9 - 10.3 mg/dL 9.2  9.1  9.4   Total Protein 6.5 - 8.1 g/dL 7.0   6.9   Total Bilirubin 0.3 - 1.2 mg/dL 0.4   0.4   Alkaline Phos 38 - 126 U/L 151   160   AST 15 - 41 U/L 21   17   ALT 0 - 44 U/L 14   9       Latest Ref Rng & Units 04/04/2022    4:25 AM 04/03/2022    6:00 PM 01/29/2022    9:40 AM  CBC  WBC 4.0 - 10.5 K/uL 4.2  6.4    Hemoglobin 12.0 - 15.0 g/dL 12.2  13.0    Hematocrit 36.0 - 46.0 % 39.7  42.5  37.1   Platelets 150 - 400 K/uL 162  186       Assessment and Plan: * COPD exacerbation (HCC) COPD pathway IV solumedrol BID Scheduled LABA/LAMA and INH steroid PRN SABA Adult wheeze protocol Cough suppression with tessalon  Acute respiratory failure with hypoxia ([place]) Secondary to #1 above Not chronically on O2 since 2021. Improved on O2 via [place] today. O2 via [place] Continuous pulse ox  Stage 3b chronic kidney disease (HCC) Creat of 1.67 today, appears to be baseline.  Chronic diastolic CHF (congestive heart failure) (HCC) Pt pretty clear that current degree of leg swelling is her baseline. BNP nl today And no pulm edema. Dont think this is significantly contributing to presentation / respiratory symptoms today. Cont home lasix  History of lung cancer Long term remission following SBRT in 2012. Follows once a year with heme/onc. Neg CTA chest today.      Advance Care Planning:   Code Status: Full Code  Consults: None  Family Communication:  No family in room  Severity of Illness: The appropriate patient status for this patient is OBSERVATION. Observation status is judged to be reasonable and necessary in order to provide the required intensity of service to ensure the patient's safety. The patient's presenting symptoms, physical exam findings, and initial radiographic and laboratory data in the context of their medical condition is felt to place them at decreased risk for further clinical deterioration. Furthermore, it is anticipated that the patient will be medically stable for discharge from the hospital within 2 midnights of admission.   Author: Etta Quill., DO 04/04/2022 5:25 AM  For on call review [email or web address].

## 2022-04-04 NOTE — Evaluation (Signed)
Physical Therapy Evaluation Patient Details Name: Pamela [name] MRN: [id number] DOB: 08-03-1950 Today's Date: 04/04/2022  History of Present Illness  Monna Crean is a 71 y.o. female with medical history significant of HFpEF, COPD, sarcoidosis, NSCLC s/p SBRT 2012 -> long term remission, HTN, second hand smoke exposure in past (no longer), OSA.     Pt sent in to ED 04/03/22  from pulm clinic with SOB, hypoxia  Clinical Impression   Patient admitted for above problems. Patient  resting in bed on 2 L , SPO2 93%, HR 94 .  Patient mobilized  to sitting  with min Assistance,and then transferred to recliner  using SPC, wide base, unable to progress ambulation at this time with Dyspnea 3/4 and  frequent coughing.[name] Kitchen Spo2 88% after transfer.  Pt admitted with above diagnosis.  Pt currently with functional limitations due to the deficits listed below (see PT Problem List). Pt will benefit from skilled PT to increase their independence and safety with mobility to allow discharge to the venue listed below.         Recommendations for follow up therapy are one component of a multi-disciplinary discharge planning process, led by the attending physician.  Recommendations may be updated based on patient status, additional functional criteria and insurance authorization.  Follow Up Recommendations Home health PT      Assistance Recommended at Discharge Intermittent Supervision/Assistance  Patient can return home with the following  A little help with walking and/or transfers;Assistance with cooking/housework;Assist for transportation;Help with stairs or ramp for entrance    Equipment Recommendations None recommended by PT  Recommendations for Other Services       Functional Status Assessment Patient has had a recent decline in their functional status and demonstrates the ability to make significant improvements in function in a reasonable and predictable amount of time.     Precautions / Restrictions  Precautions Precautions: Fall Precaution Comments: monitor sats Restrictions Weight Bearing Restrictions: No      Mobility  Bed Mobility Overal bed mobility: Needs Assistance Bed Mobility: Supine to Sit     Supine to sit: Min assist     General bed mobility comments: gentle hand support to lift trunk    Transfers Overall transfer level: Needs assistance Equipment used: Straight cane Transfers: Sit to/from Stand, Bed to chair/wheelchair/BSC Sit to Stand: Min guard, From elevated surface   Step pivot transfers: Min assist       General transfer comment: patient slowly rises, wide base/body habitus. able to step to recliner., moves slowly using [facility]    Ambulation/Gait                  Stairs            Wheelchair Mobility    Modified Rankin (Stroke Patients Only)       Balance                                             Pertinent Vitals/Pain Pain Assessment Pain Assessment: No/denies pain    Home Living Family/patient expects to be discharged to:: Private residence Living Arrangements: Children Available Help at Discharge: Available 24 hours/day Type of Home: House Home Access: Stairs to enter Entrance Stairs-Rails: Psychiatric nurse of Steps: 1   Home Layout: Able to live on main level with bedroom/bathroom Home Equipment: Cane - single Barista (2 wheels) Additional Comments: son  and family live on second floor, goes up  16 steps to  do laundry 1 x week,  grand kids take  laundry up and down steps for  her    Prior Function Prior Level of Function : Independent/Modified Independent             Mobility Comments: does not drive, has a lift chair, sleeps in a bed, no trouble getting in and out ADLs Comments: IADL's     Hand Dominance   Dominant Hand: Left    Extremity/Trunk Assessment        Lower Extremity Assessment Lower Extremity Assessment: Generalized weakness     Cervical / Trunk Assessment Cervical / Trunk Assessment: Normal;Other exceptions Cervical / Trunk Exceptions: body habitus,  Communication   Communication: No difficulties  Cognition Arousal/Alertness: Awake/alert Behavior During Therapy: WFL for tasks assessed/performed Overall Cognitive Status: Within Functional Limits for tasks assessed                                          General Comments      Exercises     Assessment/Plan    PT Assessment Patient needs continued PT services  PT Problem List Decreased strength;Decreased mobility;Cardiopulmonary status limiting activity;Decreased activity tolerance       PT Treatment Interventions DME instruction;Therapeutic activities;Gait training;Therapeutic exercise;Patient/family education;Functional mobility training    PT Goals (Current goals can be found in the Care Plan section)  Acute Rehab PT Goals Patient Stated Goal: get back home PT Goal Formulation: With patient Time For Goal Achievement: 04/18/22 Potential to Achieve Goals: Good    Frequency Min 3X/week     Co-evaluation               AM-PAC PT "6 Clicks" Mobility  Outcome Measure Help needed turning from your back to your side while in a flat bed without using bedrails?: A Little Help needed moving from lying on your back to sitting on the side of a flat bed without using bedrails?: A Little Help needed moving to and from a bed to a chair (including a wheelchair)?: A Little Help needed standing up from a chair using your arms (e.g., wheelchair or bedside chair)?: A Little Help needed to walk in hospital room?: Total Help needed climbing 3-5 steps with a railing? : Total 6 Click Score: 14    End of Session Equipment Utilized During Treatment: Oxygen Activity Tolerance: Treatment limited secondary to medical complications (Comment) (dyspnea and coughing) Patient left: in chair;with call bell/phone within reach;with chair alarm  set Nurse Communication: Mobility status PT Visit Diagnosis: Unsteadiness on feet (R26.81);Difficulty in walking, not elsewhere classified (R26.2)    Time: 3149-7026 PT Time Calculation (min) (ACUTE ONLY): 19 min   Charges:   PT Evaluation $PT Eval Low Complexity: 1 Low          [place] Office [phone] Weekend [phone]   Claretha Cooper 04/04/2022, 9:07 AM

## 2022-04-04 NOTE — Assessment & Plan Note (Signed)
Creat of 1.67 today, appears to be baseline.

## 2022-04-04 NOTE — ED Notes (Signed)
Pt requesting something to eat. ER MD stated patient can have crackers and water at this time but nothing more until admitting provider sees patient and places dietary order.

## 2022-04-04 NOTE — Progress Notes (Signed)
[facility]                                                                              Pamela [name], is a 71 y.o. female, DOB - 10/26/1950, ATF:[id number] Admit date - 04/03/2022    Outpatient Primary MD for the patient is Dorena Dew, FNP  LOS - 0  days  Chief Complaint  Patient presents with   Shortness of Breath       Brief summary   Patient is a 71 year old female with HFpEF, COPD, sarcoidosis, NSCLC s/p SBRT 2012 -> long term remission, HTN, second hand smoke exposure in past (no longer), OSA.  Was sent from pulmonary clinic with shortness of breath, hypoxia. States she has had difficulty breathing since July and been treated multiple times with antibiotics and steroids.  She completed her last course about a week ago.  She was hypoxic at her pulmonary clinic and sent to the ER.  Still having cough, congestion, shortness of breath for the past 4 to 5 days.  Cough productive of green and yellow mucus.  Denies chest pain.  Denies change in her chronic leg swelling.  Denies abdominal pain, nausea or vomiting.  States compliance with her Lasix and nebulizers at home.  Assessment & Plan    Principal Problem: Acute respiratory failure with hypoxia secondary to COPD exacerbation ([place]) -Not chronically on O2 since 2021, currently on 2 L O2 via [place], diffusely wheezing -Placed on scheduled nebs every 6 hours, added Brovana, Pulmicort, flutter valve -Continue IV Solu-Medrol 40 mg every 12 hours, IV Rocephin -BNP 22.4 -COVID-negative, flu negative   Active Problems:   History of lung cancer - NSCLC s/p SBRT 2012 with long term remission follows outpatient with oncology yearly -CTA on admission with no red flags    Chronic diastolic CHF (congestive heart failure) (HCC) -BNP 22.4, no pulmonary edema -Has chronic leg swelling possibly due to lymphedema, patient reports that this is her baseline -Continue home Lasix 40 mg 3 times daily, monitor I's and O's      Stage 3b chronic kidney disease (HCC) -Creatinine baseline 1.6-1.8, currently at baseline  Obesity Estimated body mass index is 53.33 kg/m as calculated from the following:   Height as of this encounter: 5\' 6"  (1.676 m).   Weight as of this encounter: 149.9 kg.  Code Status: Full code DVT Prophylaxis:  enoxaparin (LOVENOX) injection 40 mg Start: 04/04/22 1000   Level of Care: Level of care: Med-Surg Family Communication: Updated patient  Disposition Plan:      Remains inpatient appropriate: Diffuse wheezing bilaterally   Procedures:  None  Consultants:   None  Antimicrobials:   Anti-infectives (From admission, onward)    Start     Dose/Rate Route Frequency Ordered Stop   04/04/22 0500  cefTRIAXone (ROCEPHIN) 1 g in sodium chloride 0.9 % 100 mL IVPB        1 g 200 mL/hr over 30 Minutes Intravenous Every 24 hours 04/04/22 0350 04/09/22 0459          Medications  arformoterol  15 mcg Nebulization BID   aspirin EC  81 mg Oral Daily  benzonatate  200 mg Oral TID   budesonide (PULMICORT) nebulizer solution  0.25 mg Nebulization BID   calcitRIOL  0.25 mcg Oral Daily   docusate sodium  100 mg Oral BID   enoxaparin (LOVENOX) injection  40 mg Subcutaneous Q24H   febuxostat  40 mg Oral Daily   furosemide  40 mg Oral TID   ipratropium-albuterol  3 mL Nebulization Q6H   methylPREDNISolone (SOLU-MEDROL) injection  40 mg Intravenous Q12H   potassium chloride SA  20 mEq Oral Daily      Subjective:   Pamela [name] was seen and examined today.  Still has coughing, shortness of breath with diffuse wheezing bilaterally.  Patient denies dizziness, chest pain, abdominal pain, N/V/D/C, new weakness. + Cough  Objective:   Vitals:   04/04/22 0515 04/04/22 0530 04/04/22 0605 04/04/22 0959  BP: 117/74 126/70 (!) 154/89 134/72  Pulse: 91 96 90 83  Resp: 20 (!) 22 20 20   Temp:   98 F (36.7 C) 97.8 F (36.6 C)  TempSrc:   Oral Oral  SpO2: 100% 97% 95% 95%  Weight:       Height:        Intake/Output Summary (Last 24 hours) at 04/04/2022 1320 Last data filed at 04/03/2022 2252 Gross per 24 hour  Intake 50 ml  Output --  Net 50 ml     Wt Readings from Last 3 Encounters:  04/03/22 (!) 149.9 kg  04/03/22 (!) 149.9 kg  03/24/22 (!) 150.6 kg     Exam General: Alert and oriented x 3, NAD Cardiovascular: S1 S2 auscultated,  RRR Respiratory: bilateral expiratory wheezing Gastrointestinal: Soft, nontender, nondistended, + bowel sounds Ext: + pedal edema bilaterally, + lymphedema Neuro: no new deficits Psych: Normal affect and demeanor, alert and oriented x3     Data Reviewed:  I have personally reviewed following labs    CBC Lab Results  Component Value Date   WBC 4.2 04/04/2022   RBC 5.43 (H) 04/04/2022   HGB 12.2 04/04/2022   HCT 39.7 04/04/2022   MCV 73.1 (L) 04/04/2022   MCH 22.5 (L) 04/04/2022   PLT 162 04/04/2022   MCHC 30.7 04/04/2022   RDW 15.4 04/04/2022   LYMPHSABS 1.4 04/03/2022   MONOABS 0.3 04/03/2022   EOSABS 1.4 (H) 04/03/2022   BASOSABS 0.0 47/82/9562     Last metabolic panel Lab Results  Component Value Date   NA 143 04/04/2022   K 4.7 04/04/2022   CL 103 04/04/2022   CO2 30 04/04/2022   BUN 35 (H) 04/04/2022   CREATININE 1.75 (H) 04/04/2022   GLUCOSE 199 (H) 04/04/2022   GFRNONAA 31 (L) 04/04/2022   GFRAA 20 (L) 04/26/2020   CALCIUM 9.2 04/04/2022   PHOS 4.2 12/19/2019   PROT 7.0 04/04/2022   ALBUMIN 3.6 04/04/2022   LABGLOB 2.9 01/29/2022   AGRATIO 1.4 01/29/2022   BILITOT 0.4 04/04/2022   ALKPHOS 151 (H) 04/04/2022   AST 21 04/04/2022   ALT 14 04/04/2022   ANIONGAP 10 04/04/2022      Radiology Studies: I have personally reviewed the imaging studies  CT Angio Chest PE W and/or Wo Contrast  Result Date: 04/04/2022 CLINICAL DATA:  Shortness of breath, cough, hypoxia, elevated D-dimer  IMPRESSION: No evidence of central pulmonary embolism. Radiation changes in the right upper lobe. Additional  stable ancillary findings as above. Aortic Atherosclerosis (ICD10-I70.0). Electronically Signed   By: Julian Hy M.D.   On: 04/04/2022 00:45   DG Chest Portable 1 View  Result Date: 04/03/2022 CLINICAL DATA:  Short of breath for 2-3 weeks, worsening over the last 3 days, history of non-small cell lung cancer EXAM: PORTABLE CHEST 1 VIEW COMPARISON:  11/20/2021, 12/17/2019 FINDINGS: Single frontal view of the chest demonstrates a stable cardiac silhouette. Stable right upper lobe scarring. No acute airspace disease, effusion, or pneumothorax. No acute bony abnormalities. Loop recorder left anterior chest unchanged. IMPRESSION: 1. Stable right upper lobe scarring.  No acute airspace disease. Electronically Signed   By: Randa Ngo M.D.   On: 04/03/2022 17:16       Pablo Stauffer M.D. [facility] 04/04/2022, 1:20 PM  Available via Epic secure chat 7am-7pm After 7 pm, please refer to night coverage provider listed on amion.

## 2022-04-05 DIAGNOSIS — J9601 Acute respiratory failure with hypoxia: Secondary | ICD-10-CM | POA: Diagnosis not present

## 2022-04-05 DIAGNOSIS — I5032 Chronic diastolic (congestive) heart failure: Secondary | ICD-10-CM | POA: Diagnosis not present

## 2022-04-05 DIAGNOSIS — Z85118 Personal history of other malignant neoplasm of bronchus and lung: Secondary | ICD-10-CM | POA: Diagnosis not present

## 2022-04-05 DIAGNOSIS — J441 Chronic obstructive pulmonary disease with (acute) exacerbation: Secondary | ICD-10-CM | POA: Diagnosis not present

## 2022-04-05 MED ORDER — GUAIFENESIN-CODEINE 100-10 MG/5ML PO SOLN
5.0000 mL | Freq: Four times a day (QID) | ORAL | Status: DC | PRN
  Administered 2022-04-05 – 2022-04-08 (×5): 5 mL via ORAL
  Filled 2022-04-05 (×5): qty 5

## 2022-04-05 MED ORDER — PROSOURCE PLUS PO LIQD
30.0000 mL | Freq: Two times a day (BID) | ORAL | Status: DC
  Administered 2022-04-05 – 2022-04-08 (×6): 30 mL via ORAL
  Filled 2022-04-05 (×6): qty 30

## 2022-04-05 NOTE — Progress Notes (Signed)
[facility]                                                                              Pamela [name], is a 71 y.o. female, DOB - 1951/02/08, EXN:[id number] Admit date - 04/03/2022    Outpatient Primary MD for the patient is Dorena Dew, FNP  LOS - 1  days  Chief Complaint  Patient presents with   Shortness of Breath       Brief summary   Patient is a 71 year old female with HFpEF, COPD, sarcoidosis, NSCLC s/p SBRT 2012 -> long term remission, HTN, second hand smoke exposure in past (no longer), OSA.  Was sent from pulmonary clinic with shortness of breath, hypoxia. States she has had difficulty breathing since July and been treated multiple times with antibiotics and steroids.  She completed her last course about a week ago.  She was hypoxic at her pulmonary clinic and sent to the ER.  Still having cough, congestion, shortness of breath for the past 4 to 5 days.  Cough productive of green and yellow mucus.  Denies chest pain.  Denies change in her chronic leg swelling.  Denies abdominal pain, nausea or vomiting.  States compliance with her Lasix and nebulizers at home.  Assessment & Plan    Principal Problem: Acute respiratory failure with hypoxia secondary to COPD exacerbation ([place]) -Not chronically on O2 since 2021, -On 2 L O2 via , sats 97%, diffusely wheezing and intractable coughing -Continue scheduled nebs, Brovana, Pulmicort, flutter valve -Continue IV Solu-Medrol 40 mg every 12 hours, IV Rocephin -BNP 22.4 -COVID-negative, flu negative -Added Tessalon Perles, Robitussin with codeine   Active Problems:   History of lung cancer - NSCLC s/p SBRT 2012 with long term remission follows outpatient with oncology yearly -CTA on admission with no red flags    Chronic diastolic CHF (congestive heart failure) (HCC) -BNP 22.4, no pulmonary edema -Has chronic leg swelling possibly due to lymphedema, patient reports that this is her baseline -Continue  home Lasix 40 mg 3 times daily (home dose), monitor I's and O's -Creatinine slightly up, but still within baseline, follow bmet in a.m.     Stage 3b chronic kidney disease (HCC) -Creatinine baseline 1.6-1.8,  -Creatinine 1.7, still within baseline  Obesity Estimated body mass index is 53.33 kg/m as calculated from the following:   Height as of this encounter: 5\' 6"  (1.676 m).   Weight as of this encounter: 149.9 kg.  Code Status: Full code DVT Prophylaxis:  enoxaparin (LOVENOX) injection 40 mg Start: 04/04/22 1000   Level of Care: Level of care: Med-Surg Family Communication: Updated patient  Disposition Plan:      Remains inpatient appropriate: Diffuse wheezing bilaterally   Procedures:  None  Consultants:   None  Antimicrobials:   Anti-infectives (From admission, onward)    Start     Dose/Rate Route Frequency Ordered Stop   04/04/22 0500  cefTRIAXone (ROCEPHIN) 1 g in sodium chloride 0.9 % 100 mL IVPB        1 g 200 mL/hr over 30 Minutes Intravenous Every 24 hours 04/04/22 0350 04/09/22 0459  Medications  (feeding supplement) PROSource Plus  30 mL Oral BID BM   arformoterol  15 mcg Nebulization BID   aspirin EC  81 mg Oral Daily   benzonatate  200 mg Oral TID   budesonide (PULMICORT) nebulizer solution  0.25 mg Nebulization BID   calcitRIOL  0.25 mcg Oral Daily   docusate sodium  100 mg Oral BID   enoxaparin (LOVENOX) injection  40 mg Subcutaneous Q24H   febuxostat  40 mg Oral Daily   furosemide  40 mg Oral TID   ipratropium-albuterol  3 mL Nebulization Q6H   methylPREDNISolone (SOLU-MEDROL) injection  40 mg Intravenous Q12H   potassium chloride SA  20 mEq Oral Daily      Subjective:   Suriah Peragine was seen and examined today.  Coughing spells, still diffusely wheezing.  No fevers, chest pain, nausea or vomiting.  Objective:   Vitals:   04/05/22 0042 04/05/22 0454 04/05/22 0810 04/05/22 0812  BP:  133/80    Pulse:  64    Resp:  18     Temp:  98.5 F (36.9 C)    TempSrc:      SpO2: 95% 100% 100% 97%  Weight:      Height:        Intake/Output Summary (Last 24 hours) at 04/05/2022 1253 Last data filed at 04/05/2022 0400 Gross per 24 hour  Intake 2.81 ml  Output --  Net 2.81 ml     Wt Readings from Last 3 Encounters:  04/03/22 (!) 149.9 kg  04/03/22 (!) 149.9 kg  03/24/22 (!) 150.6 kg   Physical Exam General: Alert and oriented x 3, NAD, coughing spells Cardiovascular: S1 S2 clear, RRR.  Respiratory: Diffuse expiratory wheezing bilaterally Gastrointestinal: Soft, nontender, nondistended, NBS Ext: chronic pedal edema bilaterally (per patient, this is at baseline) Neuro: no new deficits Psych: Normal affect and demeanor, alert and oriented x3     Data Reviewed:  I have personally reviewed following labs    CBC Lab Results  Component Value Date   WBC 4.2 04/04/2022   RBC 5.43 (H) 04/04/2022   HGB 12.2 04/04/2022   HCT 39.7 04/04/2022   MCV 73.1 (L) 04/04/2022   MCH 22.5 (L) 04/04/2022   PLT 162 04/04/2022   MCHC 30.7 04/04/2022   RDW 15.4 04/04/2022   LYMPHSABS 1.4 04/03/2022   MONOABS 0.3 04/03/2022   EOSABS 1.4 (H) 04/03/2022   BASOSABS 0.0 16/04/9603     Last metabolic panel Lab Results  Component Value Date   NA 143 04/04/2022   K 4.7 04/04/2022   CL 103 04/04/2022   CO2 30 04/04/2022   BUN 35 (H) 04/04/2022   CREATININE 1.75 (H) 04/04/2022   GLUCOSE 199 (H) 04/04/2022   GFRNONAA 31 (L) 04/04/2022   GFRAA 20 (L) 04/26/2020   CALCIUM 9.2 04/04/2022   PHOS 4.2 12/19/2019   PROT 7.0 04/04/2022   ALBUMIN 3.6 04/04/2022   LABGLOB 2.9 01/29/2022   AGRATIO 1.4 01/29/2022   BILITOT 0.4 04/04/2022   ALKPHOS 151 (H) 04/04/2022   AST 21 04/04/2022   ALT 14 04/04/2022   ANIONGAP 10 04/04/2022      Radiology Studies: I have personally reviewed the imaging studies  CT Angio Chest PE W and/or Wo Contrast  Result Date: 04/04/2022 CLINICAL DATA:  Shortness of breath, cough, hypoxia,  elevated D-dimer  IMPRESSION: No evidence of central pulmonary embolism. Radiation changes in the right upper lobe. Additional stable ancillary findings as above. Aortic Atherosclerosis (ICD10-I70.0). Electronically Signed  By: Julian Hy M.D.   On: 04/04/2022 00:45   DG Chest Portable 1 View  Result Date: 04/03/2022 CLINICAL DATA:  Short of breath for 2-3 weeks, worsening over the last 3 days, history of non-small cell lung cancer EXAM: PORTABLE CHEST 1 VIEW COMPARISON:  11/20/2021, 12/17/2019 FINDINGS: Single frontal view of the chest demonstrates a stable cardiac silhouette. Stable right upper lobe scarring. No acute airspace disease, effusion, or pneumothorax. No acute bony abnormalities. Loop recorder left anterior chest unchanged. IMPRESSION: 1. Stable right upper lobe scarring.  No acute airspace disease. Electronically Signed   By: Randa Ngo M.D.   On: 04/03/2022 17:16       Amadi Yoshino M.D. [facility] 04/05/2022, 12:53 PM  Available via Epic secure chat 7am-7pm After 7 pm, please refer to night coverage provider listed on amion.

## 2022-04-05 NOTE — Discharge Instructions (Signed)
Heart-Healthy Nutrition Therapy  A heart-healthy diet is recommended to reduce your unhealthy blood cholesterol levels, manage high blood pressure, and lower your risk for heart disease. To follow a heart-healthy diet, Eat a balanced diet with whole grains, fruits and vegetables, and lean protein sources. Achieve and maintain a healthy weight. Choose heart-healthy unsaturated fats. Limit saturated fats, trans fats, and cholesterol intake. Eat more plant-based or vegetarian meals using beans and soy foods for protein. Eat whole, unprocessed foods to limit the amount of sodium (salt) you eat. Limit refined carbohydrates especially sugar, sweets and sugar-sweetened beverages. If you drink alcohol, do so in moderation: one serving per day (women) and two servings per day (men). One serving is equivalent to 12 ounces beer, 5 ounces wine, or 1.5 ounces distilled spirits  Tips Tips for Choosing Heart-Healthy Fats Choose lean protein and low-fat dairy foods to reduce saturated fat intake. Saturated fat is usually found in animal-based protein and is associated with certain health risks. Saturated fat is the biggest contributor to raised low-density lipoprotein (LDL) cholesterol levels in the diet. Research shows that limiting saturated fat lowers unhealthy cholesterol levels. Eat no more than 5-6% of your total calories each day from saturated fat. Ask your registered dietitian nutritionist (RDN) to help you determine how much saturated fat is right for you. There are many foods that do not contain large amounts of saturated fats. Swapping these foods to replace foods high in saturated fats will help you limit the saturated fat you eat and improve your cholesterol levels. You can also try eating more plant-based or vegetarian meals. Instead of. Try:  Whole milk, cheese, yogurt, and ice cream 1%, %, or skim milk, low-fat cheese, non-fat yogurt, and low-fat ice cream  Fatty, marbled beef and pork Lean  beef, pork, or venison  Poultry with skin Poultry without skin  Butter, stick margarine Reduced-fat, whipped, or liquid spreads  Coconut oil, palm oil Liquid vegetable oils: corn, canola, olive, soybean and safflower oils   Avoid trans fats. Trans fats increase levels of LDL-cholesterol. Hydrogenated fat in processed foods is the main source of trans fats in foods.  Trans fats can be found in stick margarine, shortening, processed sweets, baked goods, some fried foods, and packaged foods made with hydrogenated oils. Avoid foods with "partially hydrogenated oil" on the ingredient list such as: cookies, pastries, baked goods, biscuits, crackers, microwave popcorn, and frozen dinners. Choose foods with heart healthy fats. Polyunsaturated and monounsaturated fat are unsaturated fats that may help lower your blood cholesterol level when used in place of saturated fat in your diet. Ask your RDN about taking a dietary supplement with plant sterols and stanols to help lower your cholesterol level. Research shows that substituting saturated fats with unsaturated fats is beneficial to cholesterol levels. Try these easy swaps:   Instead of. Try:  Butter, stick margarine, or solid shortening Reduced-fat, whipped, or liquid spreads  Beef, pork, or poultry with skin   Fish and seafood  Chips, crackers, snack foods Raw or unsalted nuts and seeds or nut butters Hummus with vegetables Avocado on toast  Coconut oil, palm oil Liquid vegetable oils: corn, canola, olive, soybean and safflower oils    Limit the amount of cholesterol you eat to less than 200 milligrams per day. Cholesterol is a substance carried through the bloodstream via lipoproteins, which are known as "transporters" of fat. Some body functions need cholesterol to work properly, but too much cholesterol in the bloodstream can damage arteries and build up blood  vessel linings (which can lead to heart attack and stroke). You should eat less than  200 milligrams cholesterol per day. People respond differently to eating cholesterol. There is no test available right now that can figure out which people will respond more to dietary cholesterol and which will respond less. For individuals with high intake of dietary cholesterol, different types of increase (none, small, moderate, large) in LDL-cholesterol levels are all possible.   Food sources of cholesterol include egg yolks and organ meats such as liver, gizzards.  Limit egg yolks to two to four per week and avoid organ meats like liver and gizzards to control cholesterol intake.  Tips for Choosing Heart-Healthy Carbohydrates Consume foods rich in viscous (soluble) fiber Viscous, or soluble, fiber is found in the walls of plant cells. Viscous fiber is found only in plant-based foods--animal-based foods like meat or dairy products do not contain fiber. In the stomach, viscous fibers absorb water and swell to form a thick, jelly-like mass. This helps to lower your unhealthy cholesterol Rich sources of viscous fiber include asparagus, Brussels sprouts, sweet potatoes, turnips, apricots, mangoes, oranges, legumes, barley, oats, and oat bran. Eat at least 5 to 10 grams of viscous fiber each day. As you increase your fiber intake gradually, also increase the amount of water you drink. This will help prevent constipation. If you have difficulty achieving this goal, ask your RDN about fiber laxatives. Choose fiber supplements made with viscous fibers such as psyllium seed husks or methylcellulose to help lower unhealthy cholesterol. Limit refined carbohydrates There are three types of carbohydrates: starches, sugar, and fiber. Some carbohydrates occur naturally in food, like the starches in rice or corn or the sugars in fruits and milk. Refined carbohydrates--foods with high amounts of simple sugars--can raise triglyceride levels. High triglyceride levels are associated with coronary heart disease. Some  examples of refined carbohydrate foods are table sugar, sweets, and beverages sweetened with added sugar. Tips for Reducing Sodium (Salt) Although sodium is important for your body to function, too much sodium can be harmful for people with high blood pressure.  As sodium and fluid buildup in your tissues and bloodstream, your blood pressure increases. High blood pressure may cause damage to other organs and increase your risk for a stroke. Keep your salt intake to 2300 milligrams or less per day. Even if you take a pill for blood pressure or a water pill (diuretic) to remove fluid, it is still important to have less salt in your diet. Ask your RDN what amount of sodium is right for you. Avoid processed foods.  Eat more fresh foods. Fresh fruits and vegetables are naturally low in sodium, as well as frozen vegetables and fruits that have no added juices or sauces. Fresh meats are lower in sodium than processed meats, such as bacon, sausage, and hotdogs.  Read the nutrition label or ask your butcher to help you find a fresh meat that is low in sodium. Eat less salt--at the table and when cooking. A single teaspoon of table salt has 2,300 mg of sodium. Leave the salt out of recipes for pasta, casseroles, and soups. Ask your RDN how to cook your favorite recipes without sodium Be a smart shopper. Look for food packages that say "salt-free" or "sodium-free." These items contain less than 5 milligrams of sodium per serving. "Very low-sodium" products contain less than 35 milligrams of sodium per serving. "Low-sodium" products contain less than 140 milligrams of sodium per serving.  Beware of "reduced salt" or "reduced  sodium" products.  These items may still be high in sodium. Check the nutrition label.  Add flavors to your food without adding sodium. Try lemon juice, lime juice, fruit juice or vinegar.   Dry or fresh herbs add flavor. Try basil, bay leaf, dill, rosemary, parsley, sage, dry mustard,  nutmeg, thyme, and paprika. Pepper, red pepper flakes, and cayenne pepper can add spice to your meals without adding sodium. Hot sauce contains sodium, but if you use just a drop or two, it will not add up to much. Buy a sodium-free seasoning blend or make your own at home.    Additional Lifestyle Tips Achieve and maintain a healthy weight. Talk with your RDN or your doctor about what is a healthy weight for you. Set goals to reach and maintain that weight.  To lose weight, reduce your calorie intake along with increasing your physical activity. A weight loss of 10 to 15 pounds could reduce LDL-cholesterol by 5 milligrams per deciliter. Participate in physical activity. Talk with your health care team to find out what types of physical activity are best for you. Set a plan to get about 30 minutes of exercise on most days.   Foods to Choose or to Limit Food Group Foods to Choose Foods to Limit  Grains Whole grain breads and cereals, including whole wheat, barley, rye, buckwheat, corn, teff, quinoa, millet, amaranth, brown or wild rice, sorghum, and oats Pasta, especially whole wheat or other whole grain types [employer], quinoa or wild rice Whole grain crackers, bread, rolls, pitas Home-made bread with reduced-sodium baking soda Breads or crackers topped with salt Cereals (hot or cold) with more than 300 mg sodium per serving Biscuits, cornbread, and other "quick" breads prepared with baking soda Bread crumbs or stuffing mix from a store High-fat bakery products, such as doughnuts, biscuits, croissants, danish pastries, pies, cookies Instant cooking foods to which you add hot water and stir--potatoes, noodles, rice, etc. Packaged starchy foods--seasoned noodle or rice dishes, stuffing mix, macaroni and cheese dinner Snacks made with partially hydrogenated oils, including chips, cheese puffs, snack mixes, regular crackers, butter-flavored popcorn  Protein Foods Lean cuts of beef and pork  (loin, leg, round, extra lean hamburger) Skinless Press photographer and other wild game Dried beans and peas Nuts and nut butters (unsalted) Seeds and seed butters (unsalted) Meat alternatives made with soy or textured vegetable protein Egg whites or egg substitute Cold cuts made with lean meat or soy protein Higher-fat cuts of meats (ribs, t-bone steak, regular hamburger) Bacon, sausage, or hot dogs Cold cuts, such as salami or bologna, deli meats, cured meats, corned beef Organ meats (liver, brains, gizzards, sweetbreads) Poultry with skin Fried or smoked meat, poultry, and fish Whole eggs and egg yolks (more than 2-4 per week) Salted legumes, nuts, seeds, or nut/seed butters Meat alternatives with high levels of sodium (>300 mg per serving) or saturated fat (>5 g per serving)  Civil engineer, contracting Alternatives Nonfat (skim), low-fat, or 1%-fat milk Nonfat or low-fat yogurt or cottage cheese Fat-free and low-fat cheese Fortified non-dairy milk: almond, cashew, pea, and soy Whole milk, 2% fat milk, buttermilk Whole milk yogurt or ice cream Cream, half-&-half Cream cheese Sour cream Cheese  Vegetables Fresh, frozen, or canned vegetables without added fat or salt Avocados Canned or frozen vegetables with salt, fresh vegetables prepared with salt, butter, cheese, or cream sauce Fried vegetables Pickled vegetables such as olives, pickles, or sauerkraut  Fruits Fresh, frozen, canned, or dried fruit Fried fruits Fruits  served with butter or cream  Oils Unsaturated oils (corn, olive, peanut, soy, sunflower, canola) Soft or liquid margarines and vegetable oil spreads Salad dressings made from saturated fats Butter, stick margarine, shortening Partially hydrogenated oils or trans fats Tropical oils (coconut, palm, palm kernel oils)  Other Prepared or homemade foods, including soups, casseroles, and salads made from recommended ingredients and contain <600 mg sodium. Low-sodium seasonings  (ketchup, barbeque sauce) Spices, herbs, Salt-free seasoning mixes and marinades Vinegar Lemon or lime juice Prepared foods, including soups, casseroles, and salads made from recommended ingredients and contain >600 mg sodium. Frozen meals and prepared sides that are >600 mg of sodium Sugary and/or fatty desserts, candy, and other sweets Salts:  sea salt, kosher salt, onion salt, and garlic salt, seasoning mixes containing salt Flavorings: bouillon cubes, catsup or ketchup, barbeque sauce, Worcestershire sauce, soy sauce, salsa, relish, teriyaki sauce   Heart-Healthy Sample 1-Day Menu View Nutrient Info Breakfast 1 cup oatmeal 1 cup fat-free milk 1 cup blueberries 1 ounce almonds, unsalted 1 cup brewed coffee  Lunch 2 slices whole-wheat bread 2 ounces lean [place] breast 1 ounce low-fat Swiss cheese 2 slices tomato 2 lettuce leaves 1 pear 1 cup skim milk  Afternoon Snack 1 ounce trail mix with unsalted nuts, seeds, and raisins  Evening Meal 3 ounces broiled salmon 2/3 cup brown rice 1 teaspoon margarine, soft tub  cup broccoli, cooked  cup carrots, cooked 1 cup tossed salad 1 teaspoon olive oil and vinegar dressing 1 small whole-wheat roll 1 teaspoon margarine, soft tub 1 cup tea  Evening Snack 1 small banana  Daily Sum Nutrient Unit Value  Macronutrients  Energy kcal 2069  Energy kJ 8655  Protein g 146  Total lipid (fat) g 69  Carbohydrate, by difference g 228  Fiber, total dietary g 33  Sugars, total g 85  Minerals  Calcium, Ca mg 1292  Iron, Fe mg 14  Sodium, Na mg 1751  Vitamins  Vitamin C, total ascorbic acid mg 85  Vitamin A, IU IU 17756  Vitamin D IU 231  Lipids  Fatty acids, total saturated g 12  Fatty acids, total monounsaturated g 27  Fatty acids, total polyunsaturated g 24  Cholesterol mg 270     Heart-Healthy Vegan 1-Day Sample Menu View Nutrient Info Breakfast 1 slice whole wheat toast 2 tablespoons peanut butter without salt Tofu scramble  made with:  cup calcium-set tofu  cup green pepper  cup spinach  cup tomatoes  cup white mushrooms 1 teaspoon canola oil 1 cup soymilk fortified with calcium, vitamin B12, and vitamin D  Lunch 1 cup reduced sodium split pea soup 1 whole wheat dinner roll 1 medium apple  Dinner Salad made with: 1 cup lentils  cup cooked broccoli  cup cooked carrots 2 tablespoons hummus 1 cup sliced strawberries 1 cup soymilk fortified with calcium, vitamin B12, and vitamin D  Evening Snack 1 cup soy yogurt  cup mixed nuts  Daily Sum Nutrient Unit Value  Macronutrients  Energy kcal 1672  Energy kJ 7000  Protein g 86  Total lipid (fat) g 65  Carbohydrate, by difference g 205  Fiber, total dietary g 47  Sugars, total g 73  Minerals  Calcium, Ca mg 1443  Iron, Fe mg 19  Sodium, Na mg 1148  Vitamins  Vitamin C, total ascorbic acid mg 253  Vitamin A, IU IU 15451  Vitamin D IU 361  Lipids  Fatty acids, total saturated g 11  Fatty acids, total monounsaturated g  29  Fatty acids, total polyunsaturated g 19  Cholesterol mg 5     Heart-Healthy Vegetarian (Lacto-Ovo) Sample 1-Day Menu View Nutrient Info Breakfast 1 cup cooked oatmeal 1 tablespoon ground flaxseed 1 cup blueberries 2 scrambled egg whites with 1 teaspoon canola oil 2 tablespoons salsa 1 cup fat-free milk 1 cup coffee Oil, canola  Lunch 2 slices whole wheat bread 2 tablespoons peanut butter without salt 1 small banana 6 ounces fat-free plain yogurt 1 cup sliced red pepper 2 tablespoons hummus 1 cup fat-free milk  Evening Meal Stir fry made with:  cup tofu 1 cup brown rice  cup cooked broccoli  cup cooked carrots  cup cooked green beans 1 teaspoon peanut oil  Evening Snack 1 slice low-fat mozzarella cheese 1 medium apple  Daily Sum Nutrient Unit Value  Macronutrients  Energy kcal 1764  Energy kJ 7375  Protein g 87  Total lipid (fat) g 53  Carbohydrate, by difference g 254  Fiber, total dietary g 35   Sugars, total g 98  Minerals  Calcium, Ca mg 1609  Iron, Fe mg 12  Sodium, Na mg 1434  Vitamins  Vitamin C, total ascorbic acid mg 210  Vitamin A, IU IU 16317  Vitamin D IU 234  Lipids  Fatty acids, total saturated g 12  Fatty acids, total monounsaturated g 21  Fatty acids, total polyunsaturated g 15  Cholesterol mg 31    Copyright 2020  Academy of [employer]. All rights reserved   Additional Discharge Instructions   Please get your medications reviewed and adjusted by your Primary MD.  Please request your Primary MD to go over all Hospital Tests and Procedure/Radiological results at the follow up, please get all Hospital records sent to your Prim MD by signing hospital release before you go home.  If you had Pneumonia of Lung problems at the Hospital: Please get a 2 view Chest X ray done in approximately 4 weeks after hospital discharge or sooner if instructed by your Primary MD.  If you have Congestive Heart Failure: Please call your Cardiologist or Primary MD anytime you have any of the following symptoms:  1) 3 pound weight gain in 24 hours or 5 pounds in 1 week  2) shortness of breath, with or without a dry hacking cough  3) swelling in the hands, feet or stomach  4) if you have to sleep on extra pillows at night in order to breathe  Follow cardiac low salt diet and 1.5 lit/day fluid restriction.  If you have diabetes Accuchecks 4 times/day, Once in AM empty stomach and then before each meal. Log in all results and show them to your primary doctor at your next visit. If any glucose reading is under 80 or above 300 call your primary MD immediately.  If you have Seizure/Convulsions/Epilepsy: Please do not drive, operate heavy machinery, participate in activities at heights or participate in high speed sports until you have seen by Primary MD or a Neurologist and advised to do so again. Per [facility] statutes, patients with seizures are not  allowed to drive until they have been seizure-free for six months.  Use caution when using heavy equipment or power tools. Avoid working on ladders or at heights. Take showers instead of baths. Ensure the water temperature is not too high on the home water heater. Do not go swimming alone. Do not lock yourself in a room alone (i.e. bathroom). When caring for infants or small children, sit down when  holding, feeding, or changing them to minimize risk of injury to the child in the event you have a seizure. Maintain good sleep hygiene. Avoid alcohol.   If you had Gastrointestinal Bleeding: Please ask your Primary MD to check a complete blood count within one week of discharge or at your next visit. Your endoscopic/colonoscopic biopsies that are pending at the time of discharge, will also need to followed by your Primary MD.  Get Medicines reviewed and adjusted. Please take all your medications with you for your next visit with your Primary MD  Please request your Primary MD to go over all hospital tests and procedure/radiological results at the follow up, please ask your Primary MD to get all Hospital records sent to his/her office.  If you experience worsening of your admission symptoms, develop shortness of breath, life threatening emergency, suicidal or homicidal thoughts you must seek medical attention immediately by calling 911 or calling your MD immediately  if symptoms less severe.  You must read complete instructions/literature along with all the possible adverse reactions/side effects for all the Medicines you take and that have been prescribed to you. Take any new Medicines after you have completely understood and accpet all the possible adverse reactions/side effects.   Do not drive or operate heavy machinery when taking Pain medications.   Do not take more than prescribed Pain, Sleep and Anxiety Medications  Special Instructions: If you have smoked or chewed Tobacco  in the last 2 yrs  please stop smoking, stop any regular Alcohol  and or any Recreational drug use.  Wear Seat belts while driving.  Please note You were cared for by a hospitalist during your hospital stay. If you have any questions about your discharge medications or the care you received while you were in the hospital after you are discharged, you can call the unit and asked to speak with the hospitalist on call if the hospitalist that took care of you is not available. Once you are discharged, your primary care physician will handle any further medical issues. Please note that NO REFILLS for any discharge medications will be authorized once you are discharged, as it is imperative that you return to your primary care physician (or establish a relationship with a primary care physician if you do not have one) for your aftercare needs so that they can reassess your need for medications and monitor your lab values.  You can reach the hospitalist office at phone [phone] or fax [phone]   If you do not have a primary care physician, you can call [phone] for a physician referral.

## 2022-04-05 NOTE — Progress Notes (Signed)
Patient does not want to wear CPAP tonight.Patient stated that she had a sleep study done and that they said she did not need a cpap machine

## 2022-04-05 NOTE — Progress Notes (Signed)
Initial Nutrition Assessment RD working remotely.  DOCUMENTATION CODES:   Morbid obesity  INTERVENTION:  - will order 30 ml Prosource Plus BID, each supplement provides 100 kcal and 15 grams protein.   - added Heart Healthy Nutrition Therapy handout from the Academy of [employer]etics to AVS.   NUTRITION DIAGNOSIS:   Increased nutrient needs related to acute illness as evidenced by estimated needs.  GOAL:   Patient will meet greater than or equal to 90% of their needs  MONITOR:   PO intake, Supplement acceptance, Labs, Weight trends, I & O's  REASON FOR ASSESSMENT:   Consult Assessment of nutrition requirement/status  ASSESSMENT:   71 year old female with medical history of HFpEF, COPD, stage 3 CKD, sarcoidosis, NSCLC s/p SBRT 2012 -> long term remission, HTN, second-hand smoke exposure in past (no longer), and OSA. She was sent to the ED from Pulmonary clinic due to shortness of breath and hypoxia. Patient reported difficulty breathing since July and that she was treated with several rounds of steroids and abx; last course completed ~1 week ago. She has experienced cough productive of green and yellow sputum, congestion, and shortness of breath x4-5 days. She was admitted for COPD exacerbation.  Unable to reach patient on via call into room phone. She was last assessed by a Hatch RD on 09/07/2018.  MST score of 0. No meal intakes documented this admission.  Weight on 9/15 was 330 lb and weight on 7/18 was 342 lb. This indicates 12 lb weight loss (3.5% body weight) in the past 2 months; not significant for time frame. Weight on 8/22 was 335 lb which indicates 5 lb weight loss (1.5% body weight) in the past ~1 month; not significant for time frame.  Noted moderate pitting edema to BLE documented in the edema section of flow sheet and patient ordered oral lasix TID.   Labs reviewed; BUN: 35 mg/dl, creatinine: 1.75 mg/dl, GFR: 31 ml/min.  Medications reviewed;  100 mg colace BID, 40 mg oral lasix TID, 40 mg solu-medrol BID, 20 mEq Klor-Con/day.    NUTRITION - FOCUSED PHYSICAL EXAM:  RD working remotely.  Diet Order:   Diet Order             Diet Heart Room service appropriate? Yes; Fluid consistency: Thin  Diet effective now                   EDUCATION NEEDS:   Education needs have been addressed  Skin:  Skin Assessment: Reviewed RN Assessment  Last BM:  PTA/unknown  Height:   Ht Readings from Last 1 Encounters:  04/03/22 5\' 6"  (1.676 m)    Weight:   Wt Readings from Last 1 Encounters:  04/03/22 (!) 149.9 kg     BMI:  Body mass index is 53.33 kg/m.  Estimated Nutritional Needs:  Kcal:  2000-2200 kcal Protein:  100-110 grams Fluid:  >/= 1.5 L/day      Jarome Matin, MS, RD, LDN, CNSC Registered Dietitian II Inpatient Clinical Nutrition RD pager # and on-call/weekend pager # available in [facility]

## 2022-04-06 DIAGNOSIS — J441 Chronic obstructive pulmonary disease with (acute) exacerbation: Secondary | ICD-10-CM | POA: Diagnosis not present

## 2022-04-06 MED ORDER — FUROSEMIDE 40 MG PO TABS
40.0000 mg | ORAL_TABLET | Freq: Two times a day (BID) | ORAL | Status: DC
  Administered 2022-04-06: 40 mg via ORAL
  Filled 2022-04-06: qty 1

## 2022-04-06 NOTE — Progress Notes (Signed)
SDOH Interventions Today    Flowsheet Row Most Recent Value  SDOH Interventions   Food Insecurity Interventions Assist with SNAP Application, Other (Comment) (P)   [Home Health Social Work arranged]

## 2022-04-06 NOTE — Progress Notes (Signed)
Physical Therapy Treatment Patient Details Name: Pamela [name] MRN: [id number] DOB: 04-30-1951 Today's Date: 04/06/2022   History of Present Illness Pamela [name] is a 71 y.o. female with medical history significant of HFpEF, COPD, sarcoidosis, NSCLC s/p SBRT 2012 -> long term remission, HTN, second hand smoke exposure in past (no longer), OSA.     Pt sent in to ED 04/03/22  from pulm clinic with SOB, hypoxia    PT Comments    Pt tolerated increased ambulation distance of 66' with her cane. SpO2 at rest 95% on room air, 88-90% on room air while walking, then 85% on room air once seated in recliner after walking. Pt had a long standing rest break while walking for a bout of coughing. No loss of balance with ambulation.     Recommendations for follow up therapy are one component of a multi-disciplinary discharge planning process, led by the attending physician.  Recommendations may be updated based on patient status, additional functional criteria and insurance authorization.  Follow Up Recommendations  Home health PT     Assistance Recommended at Discharge Intermittent Supervision/Assistance  Patient can return home with the following Assistance with cooking/housework;Assist for transportation;Help with stairs or ramp for entrance   Equipment Recommendations  None recommended by PT    Recommendations for Other Services       Precautions / Restrictions Precautions Precautions: Fall Precaution Comments: monitor sats Restrictions Weight Bearing Restrictions: No     Mobility  Bed Mobility               General bed mobility comments: up in recliner    Transfers Overall transfer level: Modified independent Equipment used: Straight cane Transfers: Sit to/from Stand Sit to Stand: Modified independent (Device/Increase time)           General transfer comment: used armrests to power up    Ambulation/Gait Ambulation/Gait assistance: Supervision Gait Distance (Feet):  80 Feet Assistive device: Straight cane Gait Pattern/deviations: Step-through pattern, Decreased stride length Gait velocity: decr     General Gait Details: steady, no loss of balance, SaO2 88-90% on room air walking; 85% on room air once seated after walking, 95% room air at rest prior to activity; long bout of coughing while walking, pt took a standing rest break for this   Stairs             Wheelchair Mobility    Modified Rankin (Stroke Patients Only)       Balance Overall balance assessment: Mild deficits observed, not formally tested                                          Cognition Arousal/Alertness: Awake/alert Behavior During Therapy: WFL for tasks assessed/performed Overall Cognitive Status: Within Functional Limits for tasks assessed                                          Exercises      General Comments        Pertinent Vitals/Pain Pain Assessment Pain Assessment: No/denies pain    Home Living                          Prior Function            PT  Goals (current goals can now be found in the care plan section) Acute Rehab PT Goals Patient Stated Goal: get back home PT Goal Formulation: With patient Time For Goal Achievement: 04/18/22 Potential to Achieve Goals: Good Progress towards PT goals: Progressing toward goals    Frequency    Min 3X/week      PT Plan Current plan remains appropriate    Co-evaluation              AM-PAC PT "6 Clicks" Mobility   Outcome Measure  Help needed turning from your back to your side while in a flat bed without using bedrails?: A Little Help needed moving from lying on your back to sitting on the side of a flat bed without using bedrails?: A Little Help needed moving to and from a bed to a chair (including a wheelchair)?: None Help needed standing up from a chair using your arms (e.g., wheelchair or bedside chair)?: None Help needed to walk in  hospital room?: None Help needed climbing 3-5 steps with a railing? : A Little 6 Click Score: 21    End of Session   Activity Tolerance: Patient tolerated treatment well Patient left: in chair;with chair alarm set;with call bell/phone within reach Nurse Communication: Mobility status PT Visit Diagnosis: Unsteadiness on feet (R26.81);Difficulty in walking, not elsewhere classified (R26.2)     Time: 7322-0254 PT Time Calculation (min) (ACUTE ONLY): 11 min  Charges:  $Gait Training: 8-22 mins                     Blondell Reveal Kistler PT 04/06/2022  Acute Rehabilitation Services  Office [phone]

## 2022-04-06 NOTE — TOC Initial Note (Signed)
Transition of Care [facility]) - Initial/Assessment Note    Patient Details  Name: Pamela [name] MRN: [id number] Date of Birth: 10-21-1950  [facility]) CM/SW Contact:    Vassie Moselle, LCSW Phone Number: 04/06/2022, 12:26 PM  Clinical Narrative:                 Met with pt and confirmed plan for home health. Pt states she has had home health in the past but is unsure who she used. CSW discussed SDOH food insecurity with pt who shares that she does struggle to afford food at times. Pt is interested in applying for SNAP benefits. CSW explained that the hospital is unable to assist with food stamp applications however, home health SW could be arranged to assist with applying for food stamps and medicaid once she is home. Pt is agreeable to this plan. Home health PT and SW has been arranged through [place]. Orders for HHPT/SW will need to be placed prior to discharge.   Expected Discharge Plan: [place] Barriers to Discharge: No Barriers Identified   Patient Goals and CMS Choice Patient states their goals for this hospitalization and ongoing recovery are:: To return home   Choice offered to / list presented to : Patient  Expected Discharge Plan and Services Expected Discharge Plan: [place] In-house Referral: NA Discharge Planning Services: NA Post Acute Care Choice: [place] arrangements for the past 2 months: Single Family Home                 DME Arranged: N/A DME Agency: NA       HH Arranged: PT, Social Work [employer] Agency: [place] Date [place]: 04/06/22 Time HH Agency Contacted: 55 Representative spoke with at [place]: [place] Arrangements/Services Living arrangements for the past 2 months: [place] with:: Self Patient language and need for interpreter reviewed:: Yes Do you feel safe going back to the place where you live?: Yes      Need for Family  Participation in Patient Care: No (Comment) Care giver support system in place?: No (comment) Current home services: DME Criminal Activity/Legal Involvement Pertinent to Current Situation/Hospitalization: No - Comment as needed  Activities of Daily Living Home Assistive Devices/Equipment: Cane (specify quad or straight) ADL Screening (condition at time of admission) Patient's cognitive ability adequate to safely complete daily activities?: Yes Is the patient deaf or have difficulty hearing?: No Does the patient have difficulty seeing, even when wearing glasses/contacts?: No Does the patient have difficulty concentrating, remembering, or making decisions?: No Patient able to express need for assistance with ADLs?: Yes Does the patient have difficulty dressing or bathing?: No Independently performs ADLs?: Yes (appropriate for developmental age) Does the patient have difficulty walking or climbing stairs?: Yes Weakness of Legs: Both Weakness of Arms/Hands: None  Permission Sought/Granted Permission sought to share information with : Case Manager Permission granted to share information with : No              Emotional Assessment Appearance:: Appears stated age Attitude/Demeanor/Rapport: Engaged Affect (typically observed): Accepting Orientation: : Oriented to Self, Oriented to Place, Oriented to  Time, Oriented to Situation Alcohol / Substance Use: Not Applicable Psych Involvement: No (comment)  Admission diagnosis:  COPD exacerbation ([place]) [J44.1] Patient Active Problem List   Diagnosis Date Noted   COPD exacerbation ([place]) 04/04/2022   Vitamin D deficiency 02/02/2022   Stage 3b chronic kidney disease (  [place]) 09/24/2020   Malignant neoplasm of lung ([place]) 09/24/2020   Aortic atherosclerosis ([place]) 09/24/2020   PFO (patent foramen ovale) 09/24/2020   History of stroke 09/24/2020   Acute respiratory failure with hypoxia ([place]) 12/17/2019   COPD with acute exacerbation ([place])  12/17/2019   COVID-19 virus infection 01/02/2019   Right knee pain 09/15/2018   Closed head injury with concussion 09/06/2018   Hypokalemia 09/06/2018   Hypomagnesemia 09/06/2018   Asthma, chronic obstructive, with acute exacerbation ([place]) 08/02/2018   Plantar fasciitis 04/14/2017   Microcytosis 11/28/2015   Solitary kidney 07/18/2015   Onychomycosis 01/09/2015   Ingrown nail 01/09/2015   Pain in lower limb 01/09/2015   Chronic respiratory failure with hypoxia and hypercapnia (HCC) 07/19/2014   Anemia, iron deficiency 05/27/2014   Acute gouty arthritis 05/25/2014   Obstructive sleep apnea 05/25/2014   Joint pain 05/24/2014   Chronic diastolic CHF (congestive heart failure) ([place])    Sarcoidosis    Metabolic syndrome 88/75/7972   Asthma, chronic 04/27/2014   Anemia of chronic disease 04/19/2014   Morbid obesity ([place]) 04/18/2014   Immunization due 04/18/2014   Need for prophylactic vaccination and inoculation against influenza 04/18/2014   Prediabetes 04/13/2014   Essential hypertension 04/13/2014   Lower extremity edema 04/13/2014   History of lung cancer 04/13/2014   PCP:  Dorena Dew, FNP Pharmacy:   [facility] ([facility] [place] - [address] [place] [address] Phone: [phone] Fax: [phone]  [place], [place] - [address] [place] [address] Phone: [phone] Fax: [phone]     Social Determinants of Health (SDOH) Interventions Food Insecurity Interventions: Assist with SNAP Application, Other (Comment) ([place] Work arranged)  Readmission Risk Interventions    04/06/2022   12:12 PM  Readmission Risk Prevention Plan  Transportation Screening Complete  PCP or Specialist Appt within 5-7 Days Complete  Home Care Screening Complete  Medication Review (RN CM) Complete

## 2022-04-06 NOTE — Progress Notes (Signed)
PROGRESS NOTE  Pamela [name] WCB:[id number] DOB: 31-Oct-1950 DOA: 04/03/2022 PCP: Dorena Dew, FNP   LOS: 2 days   Brief Narrative / Interim history: Patient is a 71 year old female with HFpEF, COPD, sarcoidosis, NSCLC s/p SBRT 2012 -> long term remission, HTN, second hand smoke exposure in past (no longer), OSA.  Was sent from pulmonary clinic with shortness of breath, hypoxia. States she has had difficulty breathing since July and been treated multiple times with antibiotics and steroids.  She completed her last course about a week ago.  She was hypoxic at her pulmonary clinic and sent to the ER.  Still having cough, congestion, shortness of breath for the past 4 to 5 days.  Cough productive of green and yellow mucus.  Denies chest pain.  Denies change in her chronic leg swelling.  Denies abdominal pain, nausea or vomiting.  States compliance with her Lasix and nebulizers at home.  Significant events: 9/16-admit to the hospital  Significant imaging / results / micro data: CT angio chest 9/16-no PE, radiation changes in the right upper lobe, stable 4 mm nodule in the posterior right upper lobe  Subjective / 24h Interval events: She is feeling better but still gets pretty short of breath with just walking to the bathroom and back.  Still has wheezing  Assesement and Plan: Principal Problem:   COPD exacerbation ([place]) Active Problems:   Acute respiratory failure with hypoxia (HCC)   History of lung cancer   Chronic diastolic CHF (congestive heart failure) (HCC)   Stage 3b chronic kidney disease (HCC)  Principal Problem: Acute respiratory failure with hypoxia secondary to COPD exacerbation ([place]) -Not chronically on O2 since 2021, currently on 2 L O2 via [place], remains diffusely wheezing this morning.  Continue nebulizers, Brovana, Pulmicort, flutter valve as well as IV steroids.  There is no evidence of pulmonary edema and BNP was unremarkable.  COVID, flu all negative.  Active  Problems: History of lung cancer - NSCLC s/p SBRT 2012 with long term remission follows outpatient with oncology yearly. CTA on admission with no red flags   Chronic diastolic CHF (congestive heart failure) (HCC) -BNP 22.4, no pulmonary edema. Has chronic leg swelling possibly due to lymphedema, patient reports that this is her baseline.  Creatinine slightly up, change Lasix to twice daily  Stage 3b chronic kidney disease (HCC) -Creatinine baseline 1.6-1.8, currently close to baseline at 1.75   Obesity -Estimated body mass index is 53.33 kg/m, she would benefit from weight loss  Scheduled Meds:  (feeding supplement) PROSource Plus  30 mL Oral BID BM   arformoterol  15 mcg Nebulization BID   aspirin EC  81 mg Oral Daily   benzonatate  200 mg Oral TID   budesonide (PULMICORT) nebulizer solution  0.25 mg Nebulization BID   calcitRIOL  0.25 mcg Oral Daily   docusate sodium  100 mg Oral BID   enoxaparin (LOVENOX) injection  40 mg Subcutaneous Q24H   febuxostat  40 mg Oral Daily   furosemide  40 mg Oral BID   ipratropium-albuterol  3 mL Nebulization Q6H   methylPREDNISolone (SOLU-MEDROL) injection  40 mg Intravenous Q12H   potassium chloride SA  20 mEq Oral Daily   Continuous Infusions:  cefTRIAXone (ROCEPHIN)  IV 1 g (04/06/22 0458)   PRN Meds:.albuterol, guaiFENesin-codeine  Current Outpatient Medications  Medication Instructions   acetaminophen (TYLENOL) 1,000 mg, Oral, Every 6 hours PRN   albuterol (PROAIR HFA) 108 (90 Base) MCG/ACT inhaler 2 puffs, Inhalation, Every 4 hours PRN  albuterol (PROVENTIL) (2.5 MG/3ML) 0.083% nebulizer solution USE 1 VIAL IN NEBULIZER EVERY 6 HOURS AS NEEDED FOR WHEEZING FOR SHORTNESS OF BREATH   aspirin EC 81 mg, Oral, Daily, Swallow whole.   calcitRIOL (ROCALTROL) 0.25 mcg, Oral, Daily   docusate sodium (STOOL SOFTENER) 100 mg, Oral, 2 times daily   febuxostat (ULORIC) 40 mg, Oral, Daily   Ferrous Sulfate (IRON PO) 1 tablet, Oral, Daily    Fluticasone-Salmeterol 55-14 MCG/ACT AEPB Take 2 puffs first thing in am and then another 2 puffs about 12 hours later.   furosemide (LASIX) 40 mg, Oral, 3 times daily   potassium chloride SA (KLOR-CON M) 20 MEQ tablet TAKE 1  BY MOUTH ONCE DAILY   Vitamin D (Ergocalciferol) (DRISDOL) 50,000 Units, Oral, Every 7 days    Diet Orders (From admission, onward)     Start     Ordered   04/04/22 0349  Diet Heart Room service appropriate? Yes; Fluid consistency: Thin  Diet effective now       Question Answer Comment  Room service appropriate? Yes   Fluid consistency: Thin      04/04/22 0350            DVT prophylaxis: enoxaparin (LOVENOX) injection 40 mg Start: 04/04/22 1000   Lab Results  Component Value Date   PLT 162 04/04/2022      Code Status: Full Code  Family Communication: No family at bedside  Status is: Inpatient Remains inpatient appropriate because: Persistent wheezing and shortness of breath   Level of care: Med-Surg  Consultants:  None  Objective: Vitals:   04/05/22 2019 04/06/22 0132 04/06/22 0453 04/06/22 0750  BP:   130/76   Pulse:   64   Resp:   (!) 21   Temp:   98 F (36.7 C)   TempSrc:      SpO2: 91% (!) 89% 97% 98%  Weight:      Height:        Intake/Output Summary (Last 24 hours) at 04/06/2022 1024 Last data filed at 04/05/2022 1524 Gross per 24 hour  Intake 100 ml  Output --  Net 100 ml   Wt Readings from Last 3 Encounters:  04/03/22 (!) 149.9 kg  04/03/22 (!) 149.9 kg  03/24/22 (!) 150.6 kg    Examination:  Constitutional: NAD Eyes: no scleral icterus ENMT: Mucous membranes are moist.  Neck: normal, supple Respiratory: Diffuse end expiratory wheezing and faint rhonchi at the bases.  Tachypneic at times Cardiovascular: Regular rate and rhythm, no murmurs / rubs / gallops.  1+ pitting lower extremity edema Abdomen: non distended, no tenderness. Bowel sounds positive.  Musculoskeletal: no clubbing / cyanosis.  Skin: no  rashes Neurologic: non focal   Data Reviewed: I have independently reviewed following labs and imaging studies   CBC Recent Labs  Lab 04/03/22 1800 04/04/22 0425  WBC 6.4 4.2  HGB 13.0 12.2  HCT 42.5 39.7  PLT 186 162  MCV 73.5* 73.1*  MCH 22.5* 22.5*  MCHC 30.6 30.7  RDW 15.7* 15.4  LYMPHSABS 1.4  --   MONOABS 0.3  --   EOSABS 1.4*  --   BASOSABS 0.0  --     Recent Labs  Lab 04/03/22 1800 04/03/22 2058 04/04/22 0425  NA 142  --  143  K 4.2  --  4.7  CL 102  --  103  CO2 29  --  30  GLUCOSE 124*  --  199*  BUN 34*  --  35*  CREATININE 1.67*  --  1.75*  CALCIUM 9.1  --  9.2  AST  --   --  21  ALT  --   --  14  ALKPHOS  --   --  151*  BILITOT  --   --  0.4  ALBUMIN  --   --  3.6  DDIMER  --  2.18*  --   BNP  --  22.4  --     ------------------------------------------------------------------------------------------------------------------ No results for input(s): "CHOL", "HDL", "LDLCALC", "TRIG", "CHOLHDL", "LDLDIRECT" in the last 72 hours.  Lab Results  Component Value Date   HGBA1C 6.0 (H) 01/29/2022   ------------------------------------------------------------------------------------------------------------------ No results for input(s): "TSH", "T4TOTAL", "T3FREE", "THYROIDAB" in the last 72 hours.  Invalid input(s): "FREET3"  Cardiac Enzymes No results for input(s): "CKMB", "TROPONINI", "MYOGLOBIN" in the last 168 hours.  Invalid input(s): "CK" ------------------------------------------------------------------------------------------------------------------    Component Value Date/Time   BNP 22.4 04/03/2022 2058   BNP 65.7 08/31/2014 0937    CBG: No results for input(s): "GLUCAP" in the last 168 hours.  Recent Results (from the past 240 hour(s))  Resp Panel by RT-PCR (Flu A&B, Covid) Anterior Nasal Swab     Status: None   Collection Time: 04/03/22  8:58 PM   Specimen: Anterior Nasal Swab  Result Value Ref Range Status   SARS Coronavirus  2 by RT PCR NEGATIVE NEGATIVE Final    Comment: (NOTE) SARS-CoV-2 target nucleic acids are NOT DETECTED.  The SARS-CoV-2 RNA is generally detectable in upper respiratory specimens during the acute phase of infection. The lowest concentration of SARS-CoV-2 viral copies this assay can detect is 138 copies/mL. A negative result does not preclude SARS-Cov-2 infection and should not be used as the sole basis for treatment or other patient management decisions. A negative result may occur with  improper specimen collection/handling, submission of specimen other than nasopharyngeal swab, presence of viral mutation(s) within the areas targeted by this assay, and inadequate number of viral copies(<138 copies/mL). A negative result must be combined with clinical observations, patient history, and epidemiological information. The expected result is Negative.  Fact Sheet for Patients:  EntrepreneurPulse.com.au  Fact Sheet for Healthcare Providers:  IncredibleEmployment.be  This test is no t yet approved or cleared by the [place] FDA and  has been authorized for detection and/or diagnosis of SARS-CoV-2 by FDA under an Emergency Use Authorization (EUA). This EUA will remain  in effect (meaning this test can be used) for the duration of the COVID-19 declaration under Section 564(b)(1) of the Act, 21 U.S.C.section 360bbb-3(b)(1), unless the authorization is terminated  or revoked sooner.       Influenza A by PCR NEGATIVE NEGATIVE Final   Influenza B by PCR NEGATIVE NEGATIVE Final    Comment: (NOTE) The Xpert Xpress SARS-CoV-2/FLU/RSV plus assay is intended as an aid in the diagnosis of influenza from Nasopharyngeal swab specimens and should not be used as a sole basis for treatment. Nasal washings and aspirates are unacceptable for Xpert Xpress SARS-CoV-2/FLU/RSV testing.  Fact Sheet for Patients: EntrepreneurPulse.com.au  Fact  Sheet for Healthcare Providers: IncredibleEmployment.be  This test is not yet approved or cleared by the [place] FDA and has been authorized for detection and/or diagnosis of SARS-CoV-2 by FDA under an Emergency Use Authorization (EUA). This EUA will remain in effect (meaning this test can be used) for the duration of the COVID-19 declaration under Section 564(b)(1) of the Act, 21 U.S.C. section 360bbb-3(b)(1), unless the authorization is terminated or revoked.  Performed at [facility]  [place] [address] [place], [place] [address]      Radiology Studies: No results found.   Marzetta Board, MD, PhD [facility]  Between 7 am - 7 pm I am available, please contact me via Amion (for emergencies) or Securechat (non urgent messages)  Between 7 pm - 7 am I am not available, please contact night coverage MD/APP via Amion

## 2022-04-07 ENCOUNTER — Ambulatory Visit (INDEPENDENT_AMBULATORY_CARE_PROVIDER_SITE_OTHER): Payer: Medicare Other | Admitting: Nurse Practitioner

## 2022-04-07 DIAGNOSIS — J441 Chronic obstructive pulmonary disease with (acute) exacerbation: Secondary | ICD-10-CM | POA: Diagnosis not present

## 2022-04-07 LAB — BASIC METABOLIC PANEL
Anion gap: 10 (ref 5–15)
BUN: 76 mg/dL — ABNORMAL HIGH (ref 8–23)
CO2: 30 mmol/L (ref 22–32)
Calcium: 8.1 mg/dL — ABNORMAL LOW (ref 8.9–10.3)
Chloride: 100 mmol/L (ref 98–111)
Creatinine, Ser: 2.12 mg/dL — ABNORMAL HIGH (ref 0.44–1.00)
GFR, Estimated: 25 mL/min — ABNORMAL LOW (ref >60)
Glucose, Bld: 147 mg/dL — ABNORMAL HIGH (ref 70–99)
Potassium: 5.1 mmol/L (ref 3.5–5.1)
Sodium: 140 mmol/L (ref 135–145)

## 2022-04-07 LAB — CBC
HCT: 37 % (ref 36.0–46.0)
Hemoglobin: 11.3 g/dL — ABNORMAL LOW (ref 12.0–15.0)
MCH: 22.1 pg — ABNORMAL LOW (ref 26.0–34.0)
MCHC: 30.5 g/dL (ref 30.0–36.0)
MCV: 72.3 fL — ABNORMAL LOW (ref 80.0–100.0)
Platelets: 163 10*3/uL (ref 150–400)
RBC: 5.12 MIL/uL — ABNORMAL HIGH (ref 3.87–5.11)
RDW: 15.2 % (ref 11.5–15.5)
WBC: 4.2 10*3/uL (ref 4.0–10.5)
nRBC: 0 % (ref 0.0–0.2)

## 2022-04-07 LAB — MAGNESIUM: Magnesium: 2.1 mg/dL (ref 1.7–2.4)

## 2022-04-07 MED ORDER — ACETAMINOPHEN 325 MG PO TABS
650.0000 mg | ORAL_TABLET | Freq: Four times a day (QID) | ORAL | Status: DC | PRN
  Administered 2022-04-07 – 2022-04-08 (×2): 650 mg via ORAL
  Filled 2022-04-07: qty 2

## 2022-04-07 MED ORDER — IPRATROPIUM-ALBUTEROL 0.5-2.5 (3) MG/3ML IN SOLN
3.0000 mL | Freq: Three times a day (TID) | RESPIRATORY_TRACT | Status: DC
  Administered 2022-04-08 (×3): 3 mL via RESPIRATORY_TRACT
  Filled 2022-04-07 (×3): qty 3

## 2022-04-07 MED ORDER — ACETAMINOPHEN 325 MG PO TABS
ORAL_TABLET | ORAL | Status: AC
  Administered 2022-04-07: 650 mg via ORAL
  Filled 2022-04-07: qty 2

## 2022-04-07 MED ORDER — SODIUM CHLORIDE 0.9 % IV BOLUS
500.0000 mL | Freq: Once | INTRAVENOUS | Status: AC
  Administered 2022-04-07: 500 mL via INTRAVENOUS

## 2022-04-07 NOTE — Progress Notes (Signed)
PROGRESS NOTE  Megen Madewell NUU:[id number] DOB: 15-Mar-1951 DOA: 04/03/2022 PCP: Dorena Dew, FNP   LOS: 3 days   Brief Narrative / Interim history: Patient is a 71 year old female with HFpEF, COPD, sarcoidosis, NSCLC s/p SBRT 2012 -> long term remission, HTN, second hand smoke exposure in past (no longer), OSA.  Was sent from pulmonary clinic with shortness of breath, hypoxia. States she has had difficulty breathing since July and been treated multiple times with antibiotics and steroids.  She completed her last course about a week ago.  She was hypoxic at her pulmonary clinic and sent to the ER.  Still having cough, congestion, shortness of breath for the past 4 to 5 days.  Cough productive of green and yellow mucus.  Denies chest pain.  Denies change in her chronic leg swelling.  Denies abdominal pain, nausea or vomiting.  States compliance with her Lasix and nebulizers at home.  Significant events: 9/16-admit to the hospital  Significant imaging / results / micro data: CT angio chest 9/16-no PE, radiation changes in the right upper lobe, stable 4 mm nodule in the posterior right upper lobe  Subjective / 24h Interval events: Still gets short of breath at times, overall much improved.  Appreciates occasional wheezing  Assesement and Plan: Principal Problem:   COPD exacerbation ([place]) Active Problems:   Acute respiratory failure with hypoxia (HCC)   History of lung cancer   Chronic diastolic CHF (congestive heart failure) (HCC)   Stage 3b chronic kidney disease (HCC)  Principal Problem: Acute respiratory failure with hypoxia secondary to COPD exacerbation ([place]) -Not chronically on O2 since 2021, currently on 2 L O2 via [place], she has persistent wheezing this morning.  Continue nebulizers, Brovana, Pulmicort, flutter valve as well as IV steroids.  There is no evidence of pulmonary edema and BNP was unremarkable.  COVID, flu all negative.  Active Problems: History of lung cancer -  NSCLC s/p SBRT 2012 with long term remission follows outpatient with oncology yearly. CTA on admission with no red flags   Chronic diastolic CHF (congestive heart failure) (HCC) -BNP 22.4, no pulmonary edema. Has chronic leg swelling possibly due to lymphedema, patient reports that this is her baseline.  Creatinine slightly up, change Lasix to twice daily  Stage 3b chronic kidney disease ([place]) -Creatinine baseline 1.6-1.89 in 2023, creatinine jumped today to 2.1.  Hold her Lasix for today.  She did receive IV contrast 3 days ago on admission for her CT angiogram so we will gently hydrate with 500 cc.  Repeat renal function in the morning   Obesity -Estimated body mass index is 53.33 kg/m, she would benefit from weight loss  Scheduled Meds:  (feeding supplement) PROSource Plus  30 mL Oral BID BM   arformoterol  15 mcg Nebulization BID   aspirin EC  81 mg Oral Daily   benzonatate  200 mg Oral TID   budesonide (PULMICORT) nebulizer solution  0.25 mg Nebulization BID   calcitRIOL  0.25 mcg Oral Daily   docusate sodium  100 mg Oral BID   enoxaparin (LOVENOX) injection  40 mg Subcutaneous Q24H   febuxostat  40 mg Oral Daily   ipratropium-albuterol  3 mL Nebulization Q6H   methylPREDNISolone (SOLU-MEDROL) injection  40 mg Intravenous Q12H   Continuous Infusions:  cefTRIAXone (ROCEPHIN)  IV 1 g (04/07/22 0524)   PRN Meds:.albuterol, guaiFENesin-codeine  Current Outpatient Medications  Medication Instructions   acetaminophen (TYLENOL) 1,000 mg, Oral, Every 6 hours PRN   albuterol (PROAIR HFA) 108 (  90 Base) MCG/ACT inhaler 2 puffs, Inhalation, Every 4 hours PRN   albuterol (PROVENTIL) (2.5 MG/3ML) 0.083% nebulizer solution USE 1 VIAL IN NEBULIZER EVERY 6 HOURS AS NEEDED FOR WHEEZING FOR SHORTNESS OF BREATH   aspirin EC 81 mg, Oral, Daily, Swallow whole.   calcitRIOL (ROCALTROL) 0.25 mcg, Oral, Daily   docusate sodium (STOOL SOFTENER) 100 mg, Oral, 2 times daily   febuxostat (ULORIC) 40 mg,  Oral, Daily   Ferrous Sulfate (IRON PO) 1 tablet, Oral, Daily   Fluticasone-Salmeterol 55-14 MCG/ACT AEPB Take 2 puffs first thing in am and then another 2 puffs about 12 hours later.   furosemide (LASIX) 40 mg, Oral, 3 times daily   potassium chloride SA (KLOR-CON M) 20 MEQ tablet TAKE 1  BY MOUTH ONCE DAILY   Vitamin D (Ergocalciferol) (DRISDOL) 50,000 Units, Oral, Every 7 days    Diet Orders (From admission, onward)     Start     Ordered   04/04/22 0349  Diet Heart Room service appropriate? Yes; Fluid consistency: Thin  Diet effective now       Question Answer Comment  Room service appropriate? Yes   Fluid consistency: Thin      04/04/22 0350            DVT prophylaxis: enoxaparin (LOVENOX) injection 40 mg Start: 04/04/22 1000   Lab Results  Component Value Date   PLT 163 04/07/2022      Code Status: Full Code  Family Communication: No family at bedside  Status is: Inpatient Remains inpatient appropriate because: Persistent wheezing and shortness of breath   Level of care: Med-Surg  Consultants:  None  Objective: Vitals:   04/06/22 1428 04/06/22 2131 04/07/22 0452 04/07/22 0721  BP: 130/67 122/67 133/73   Pulse: 62 77 63   Resp: 20 20 18    Temp: 97.6 F (36.4 C) 98.1 F (36.7 C) 97.6 F (36.4 C)   TempSrc: Oral Oral Oral   SpO2: 98% 93% 98% 96%  Weight:      Height:       No intake or output data in the 24 hours ending 04/07/22 1113  Wt Readings from Last 3 Encounters:  04/03/22 (!) 149.9 kg  04/03/22 (!) 149.9 kg  03/24/22 (!) 150.6 kg    Examination:  Constitutional: NAD Eyes: lids and conjunctivae normal, no scleral icterus ENMT: mmm Neck: normal, supple Respiratory: clear to auscultation bilaterally, no wheezing, no crackles. Normal respiratory effort.  Cardiovascular: Regular rate and rhythm, no murmurs / rubs / gallops.  1+ pitting edema Abdomen: soft, no distention, no tenderness. Bowel sounds positive.  Skin: no  rashes Neurologic: no focal deficits, equal strength   Data Reviewed: I have independently reviewed following labs and imaging studies   CBC Recent Labs  Lab 04/03/22 1800 04/04/22 0425 04/07/22 0522  WBC 6.4 4.2 4.2  HGB 13.0 12.2 11.3*  HCT 42.5 39.7 37.0  PLT 186 162 163  MCV 73.5* 73.1* 72.3*  MCH 22.5* 22.5* 22.1*  MCHC 30.6 30.7 30.5  RDW 15.7* 15.4 15.2  LYMPHSABS 1.4  --   --   MONOABS 0.3  --   --   EOSABS 1.4*  --   --   BASOSABS 0.0  --   --      Recent Labs  Lab 04/03/22 1800 04/03/22 2058 04/04/22 0425 04/07/22 0522  NA 142  --  143 140  K 4.2  --  4.7 5.1  CL 102  --  103 100  CO2  29  --  30 30  GLUCOSE 124*  --  199* 147*  BUN 34*  --  35* 76*  CREATININE 1.67*  --  1.75* 2.12*  CALCIUM 9.1  --  9.2 8.1*  AST  --   --  21  --   ALT  --   --  14  --   ALKPHOS  --   --  151*  --   BILITOT  --   --  0.4  --   ALBUMIN  --   --  3.6  --   MG  --   --   --  2.1  DDIMER  --  2.18*  --   --   BNP  --  22.4  --   --      ------------------------------------------------------------------------------------------------------------------ No results for input(s): "CHOL", "HDL", "LDLCALC", "TRIG", "CHOLHDL", "LDLDIRECT" in the last 72 hours.  Lab Results  Component Value Date   HGBA1C 6.0 (H) 01/29/2022   ------------------------------------------------------------------------------------------------------------------ No results for input(s): "TSH", "T4TOTAL", "T3FREE", "THYROIDAB" in the last 72 hours.  Invalid input(s): "FREET3"  Cardiac Enzymes No results for input(s): "CKMB", "TROPONINI", "MYOGLOBIN" in the last 168 hours.  Invalid input(s): "CK" ------------------------------------------------------------------------------------------------------------------    Component Value Date/Time   BNP 22.4 04/03/2022 2058   BNP 65.7 08/31/2014 0937    CBG: No results for input(s): "GLUCAP" in the last 168 hours.  Recent Results (from the past  240 hour(s))  Resp Panel by RT-PCR (Flu A&B, Covid) Anterior Nasal Swab     Status: None   Collection Time: 04/03/22  8:58 PM   Specimen: Anterior Nasal Swab  Result Value Ref Range Status   SARS Coronavirus 2 by RT PCR NEGATIVE NEGATIVE Final    Comment: (NOTE) SARS-CoV-2 target nucleic acids are NOT DETECTED.  The SARS-CoV-2 RNA is generally detectable in upper respiratory specimens during the acute phase of infection. The lowest concentration of SARS-CoV-2 viral copies this assay can detect is 138 copies/mL. A negative result does not preclude SARS-Cov-2 infection and should not be used as the sole basis for treatment or other patient management decisions. A negative result may occur with  improper specimen collection/handling, submission of specimen other than nasopharyngeal swab, presence of viral mutation(s) within the areas targeted by this assay, and inadequate number of viral copies(<138 copies/mL). A negative result must be combined with clinical observations, patient history, and epidemiological information. The expected result is Negative.  Fact Sheet for Patients:  EntrepreneurPulse.com.au  Fact Sheet for Healthcare Providers:  IncredibleEmployment.be  This test is no t yet approved or cleared by the [place] FDA and  has been authorized for detection and/or diagnosis of SARS-CoV-2 by FDA under an Emergency Use Authorization (EUA). This EUA will remain  in effect (meaning this test can be used) for the duration of the COVID-19 declaration under Section 564(b)(1) of the Act, 21 U.S.C.section 360bbb-3(b)(1), unless the authorization is terminated  or revoked sooner.       Influenza A by PCR NEGATIVE NEGATIVE Final   Influenza B by PCR NEGATIVE NEGATIVE Final    Comment: (NOTE) The Xpert Xpress SARS-CoV-2/FLU/RSV plus assay is intended as an aid in the diagnosis of influenza from Nasopharyngeal swab specimens and should not  be used as a sole basis for treatment. Nasal washings and aspirates are unacceptable for Xpert Xpress SARS-CoV-2/FLU/RSV testing.  Fact Sheet for Patients: EntrepreneurPulse.com.au  Fact Sheet for Healthcare Providers: IncredibleEmployment.be  This test is not yet approved or cleared by the [place]  FDA and has been authorized for detection and/or diagnosis of SARS-CoV-2 by FDA under an Emergency Use Authorization (EUA). This EUA will remain in effect (meaning this test can be used) for the duration of the COVID-19 declaration under Section 564(b)(1) of the Act, 21 U.S.C. section 360bbb-3(b)(1), unless the authorization is terminated or revoked.  Performed at [facility], [place] [address] [place], [place] [address]      Radiology Studies: No results found.   Marzetta Board, MD, PhD [facility]  Between 7 am - 7 pm I am available, please contact me via Amion (for emergencies) or Securechat (non urgent messages)  Between 7 pm - 7 am I am not available, please contact night coverage MD/APP via Amion

## 2022-04-07 NOTE — Progress Notes (Signed)
Mobility Specialist - Progress Note   04/07/22 1039  Mobility  Activity Ambulated with assistance in hallway  Level of Assistance Standby assist, set-up cues, supervision of patient - no hands on  Assistive Device Cane  Distance Ambulated (ft) 450 ft  Activity Response Tolerated well  $Mobility charge 1 Mobility   Pt received in chair and agreed for mobility, no c/o pain nor discomfort during ambulation. Pt returned to chair with all needs met and on 2L of O2.  Roderick Pee Mobility Specialist

## 2022-04-08 ENCOUNTER — Ambulatory Visit: Payer: Medicare Other | Admitting: Adult Health

## 2022-04-08 DIAGNOSIS — N179 Acute kidney failure, unspecified: Secondary | ICD-10-CM

## 2022-04-08 DIAGNOSIS — J441 Chronic obstructive pulmonary disease with (acute) exacerbation: Secondary | ICD-10-CM | POA: Diagnosis not present

## 2022-04-08 DIAGNOSIS — J9601 Acute respiratory failure with hypoxia: Secondary | ICD-10-CM | POA: Diagnosis not present

## 2022-04-08 LAB — COMPREHENSIVE METABOLIC PANEL
ALT: 12 U/L (ref 0–44)
AST: 20 U/L (ref 15–41)
Albumin: 3.3 g/dL — ABNORMAL LOW (ref 3.5–5.0)
Alkaline Phosphatase: 107 U/L (ref 38–126)
Anion gap: 11 (ref 5–15)
BUN: 77 mg/dL — ABNORMAL HIGH (ref 8–23)
CO2: 29 mmol/L (ref 22–32)
Calcium: 8.1 mg/dL — ABNORMAL LOW (ref 8.9–10.3)
Chloride: 99 mmol/L (ref 98–111)
Creatinine, Ser: 1.77 mg/dL — ABNORMAL HIGH (ref 0.44–1.00)
GFR, Estimated: 31 mL/min — ABNORMAL LOW (ref >60)
Glucose, Bld: 147 mg/dL — ABNORMAL HIGH (ref 70–99)
Potassium: 5.5 mmol/L — ABNORMAL HIGH (ref 3.5–5.1)
Sodium: 139 mmol/L (ref 135–145)
Total Bilirubin: 0.3 mg/dL (ref 0.3–1.2)
Total Protein: 6.5 g/dL (ref 6.5–8.1)

## 2022-04-08 LAB — BASIC METABOLIC PANEL
Anion gap: 11 (ref 5–15)
BUN: 80 mg/dL — ABNORMAL HIGH (ref 8–23)
CO2: 29 mmol/L (ref 22–32)
Calcium: 8.3 mg/dL — ABNORMAL LOW (ref 8.9–10.3)
Chloride: 97 mmol/L — ABNORMAL LOW (ref 98–111)
Creatinine, Ser: 1.74 mg/dL — ABNORMAL HIGH (ref 0.44–1.00)
GFR, Estimated: 31 mL/min — ABNORMAL LOW (ref >60)
Glucose, Bld: 125 mg/dL — ABNORMAL HIGH (ref 70–99)
Potassium: 5.3 mmol/L — ABNORMAL HIGH (ref 3.5–5.1)
Sodium: 137 mmol/L (ref 135–145)

## 2022-04-08 LAB — CBC
HCT: 39.5 % (ref 36.0–46.0)
Hemoglobin: 11.9 g/dL — ABNORMAL LOW (ref 12.0–15.0)
MCH: 22.1 pg — ABNORMAL LOW (ref 26.0–34.0)
MCHC: 30.1 g/dL (ref 30.0–36.0)
MCV: 73.4 fL — ABNORMAL LOW (ref 80.0–100.0)
Platelets: 171 10*3/uL (ref 150–400)
RBC: 5.38 MIL/uL — ABNORMAL HIGH (ref 3.87–5.11)
RDW: 15.6 % — ABNORMAL HIGH (ref 11.5–15.5)
WBC: 4.4 10*3/uL (ref 4.0–10.5)
nRBC: 0 % (ref 0.0–0.2)

## 2022-04-08 LAB — MAGNESIUM: Magnesium: 2.1 mg/dL (ref 1.7–2.4)

## 2022-04-08 MED ORDER — FUROSEMIDE 40 MG PO TABS: 40.0000 mg | ORAL_TABLET | Freq: Every day | ORAL | Status: DC

## 2022-04-08 MED ORDER — BENZONATATE 200 MG PO CAPS: 200.0000 mg | ORAL_CAPSULE | Freq: Three times a day (TID) | ORAL | 0 refills | Status: AC

## 2022-04-08 MED ORDER — PREDNISONE 10 MG PO TABS: ORAL_TABLET | ORAL | 0 refills | Status: DC

## 2022-04-08 MED ORDER — SODIUM ZIRCONIUM CYCLOSILICATE 10 G PO PACK
10.0000 g | PACK | Freq: Once | ORAL | Status: AC
  Administered 2022-04-08: 10 g via ORAL
  Filled 2022-04-08: qty 1

## 2022-04-08 NOTE — Discharge Summary (Addendum)
Physician Discharge Summary  Pamela [name] EVO:[id number] DOB: 02-26-1951  PCP: Dorena Dew, FNP  Admitted from: Home  Discharged to: Home  Admit date: 04/03/2022 Discharge date: 04/08/2022  Recommendations for Outpatient Follow-up:    Follow-up Information     Dorena Dew, FNP. Schedule an appointment as soon as possible for a visit in 5 day(s).   Specialty: Family Medicine Why: To be seen in 5 to 7 days with repeat labs (CBC & BMP).  Patient/family advised to call PCPs office for an early appointment. Contact information: [address] [place] [address] [phone]         Werner Lean, MD .   Specialty: Cardiology Contact information: [place] [address] [phone]         Tanda Rockers, MD. Schedule an appointment as soon as possible for a visit in 1 week(s).   Specialty: Pulmonary Disease Contact information: [place] [address] [phone]                  Home Health: Home Health Orders (From admission, onward)     Start     Ordered   04/08/22 [place]  At discharge       Question:  To provide the following care/treatments  Answer:  PT   04/08/22 1550             Equipment/Devices:     Durable Medical Equipment  (From admission, onward)           Start     Ordered   04/08/22 1549  For home use only DME oxygen  Once       Question Answer Comment  Length of Need 6 Months   Mode or (Route) Nasal cannula   Liters per Minute 3   Frequency Continuous (stationary and portable oxygen unit needed)   Oxygen conserving device Yes   Oxygen delivery system Gas      04/08/22 1550             Discharge Condition: Improved and stable.   Code Status: Full Code Diet recommendation:  Discharge Diet Orders (From admission, onward)     Start     Ordered   04/08/22 0000  Diet - low sodium heart healthy        04/08/22 1606              Discharge Diagnoses:  Principal Problem:   COPD exacerbation ([place]) Active Problems:   Acute respiratory failure with hypoxia (HCC)   History of lung cancer   Chronic diastolic CHF (congestive heart failure) (HCC)   Stage 3b chronic kidney disease ([place])   Brief Summary: Patient is a 71 year old female, lives with her son and his family, ambulates with cane at baseline but otherwise independent, with HFpEF, COPD not on home oxygen PTA-follows with Dr. Christinia Gully, Pulmonology, sarcoidosis, NSCLC s/p SBRT 2012 -> long term remission, HTN, second hand smoke exposure in past (no longer), OSA but not on CPAP was sent from pulmonary clinic with shortness of breath, hypoxia. States she has had difficulty breathing since July and been treated multiple times with antibiotics and steroids.  She completed her last course about a week PTA.  She was hypoxic at her pulmonary clinic and sent to the ER.  Still having cough, congestion, shortness of breath for the past 4 to 5 days PTA.  Cough productive of green  and yellow mucus.  Denies chest pain.  Denies change in her chronic leg swelling.  Denies abdominal pain, nausea or vomiting.  States compliance with her Lasix and nebulizers at home.  CTA chest 9/16: No PE, radiation changes in the RUL, stable 4 mm nodule in the posterior RUL.  Admitted for acute respiratory failure with hypoxia due to COPD exacerbation.  Assesement and Plan:  Principal Problem: Acute respiratory failure with hypoxia secondary to COPD/asthma exacerbation (HCC) -Not chronically on O2 since 2021.  Treated with IV Solu-Medrol 40 mg twice daily, DuoNebs scheduled, Brovana and Pulmicort nebulizations, flutter valve and oxygen support.  Continue nebulizers, Brovana, Pulmicort, flutter valve as well as IV steroids.  There is no evidence of pulmonary edema and BNP was unremarkable.  COVID, flu all negative.  Completed almost 5 days of IV steroids, transitioned to quick oral  prednisone taper at discharge.  Completed 4 days of IV ceftriaxone, no further antibiotics at discharge.  Qualified for home oxygen at time of discharge.  Close outpatient follow-up with PCP/pulmonology.  As per OP pulmonology note from 9/15, this is her third exacerbation in the last 2 months and on return follow-up we will consider more aggressive maintenance regimen-recommending triple therapy along with trigger prevention.  At discharge continue prior home as needed albuterol inhaler/nebs, fluticasone-salmeterol inhaler.  Has chronic leg edema which is unchanged and no overt findings of CHF.   Active Problems: History of lung cancer - NSCLC s/p SBRT 2012 with long term remission follows outpatient with oncology yearly. CTA on admission with no red flags   Chronic diastolic CHF (congestive heart failure) (HCC) -BNP 22.4, no pulmonary edema. Has chronic leg swelling possibly due to lymphedema, patient reports that this is her baseline.  Patient had been getting prior home dose of Lasix 40 mg 3 times daily for a couple days, noting rising creatinine, this was decreased to twice daily, got a single dose of this and creatinine bumped up to 2.12 following which all Lasix was discontinued.  Creatinine has improved and is back within her recent baseline.  We will hold Lasix for 48 hours and then resume at lower dose with close outpatient follow-up with her PCP/pulmonology with repeat BMP.   Acute kidney injury on stage 3b chronic kidney disease ([place]) -Creatinine baseline 1.6-1.89 in 2023, creatinine jumped on 9/19 to 2.1. She did receive IV contrast 4 days ago on admission for her CT angiogram.  Lasix was discontinued.  Briefly hydrated with IV fluids x500 mL.  Creatinine has improved to 1.7.  Continue to hold Lasix for an additional 48 hours and then resume at lower dose of 40 mg once daily (was on Lasix 40 mg 3 times daily PTA) with close outpatient follow-up with BMP with PCP for further dose adjustments as  deemed necessary.  Hyperkalemia: Potassium up to 5.5 on morning of day of discharge.  No comment regarding hemolysis.  Lasix was discontinued after yesterday morning's dose.  Not on any other culprit medications.  Given a dose of Lokelma 10 g x 1.  Repeat BMP shows potassium down to 5.3 but is possible that the [facility] has not had enough time to act (labs drawn after ~4 hours).  We will discontinue oral potassium supplements at discharge until outpatient follow-up with repeat BMP.  Bilateral chronic leg swelling/possible lymphedema: As per patient, these are at her baseline.  Diuretic management as noted above.  Ambulated 350 feet with PT.  Microcytic anemia: Stable.  Outpatient follow-up.  Body mass index  is 53.33 kg/m./Morbid obesity: Noted and for outpatient management.   Consultations: None  Procedures: None   Discharge Instructions  Discharge Instructions     (HEART FAILURE PATIENTS) Call MD:  Anytime you have any of the following symptoms: 1) 3 pound weight gain in 24 hours or 5 pounds in 1 week 2) shortness of breath, with or without a dry hacking cough 3) swelling in the hands, feet or stomach 4) if you have to sleep on extra pillows at night in order to breathe.   Complete by: As directed    Call MD for:  difficulty breathing, headache or visual disturbances   Complete by: As directed    Call MD for:  extreme fatigue   Complete by: As directed    Call MD for:  persistant dizziness or light-headedness   Complete by: As directed    Call MD for:  temperature >100.4   Complete by: As directed    Diet - low sodium heart healthy   Complete by: As directed    Increase activity slowly   Complete by: As directed         Medication List     STOP taking these medications    potassium chloride SA 20 MEQ tablet Commonly known as: KLOR-CON M       TAKE these medications    acetaminophen 500 MG tablet Commonly known as: TYLENOL Take 1,000 mg by mouth every 6 (six)  hours as needed for mild pain.   albuterol (2.5 MG/3ML) 0.083% nebulizer solution Commonly known as: PROVENTIL USE 1 VIAL IN NEBULIZER EVERY 6 HOURS AS NEEDED FOR WHEEZING FOR SHORTNESS OF BREATH   albuterol 108 (90 Base) MCG/ACT inhaler Commonly known as: ProAir HFA Inhale 2 puffs into the lungs every 4 (four) hours as needed for wheezing or shortness of breath.   aspirin EC 81 MG tablet Take 1 tablet (81 mg total) by mouth daily. Swallow whole.   benzonatate 200 MG capsule Commonly known as: TESSALON Take 1 capsule (200 mg total) by mouth 3 (three) times daily for 5 days.   calcitRIOL 0.25 MCG capsule Commonly known as: ROCALTROL Take 1 capsule (0.25 mcg total) by mouth daily.   febuxostat 40 MG tablet Commonly known as: ULORIC Take 1 tablet (40 mg total) by mouth daily.   Fluticasone-Salmeterol 55-14 MCG/ACT Aepb Take 2 puffs first thing in am and then another 2 puffs about 12 hours later.   furosemide 40 MG tablet Commonly known as: LASIX Take 1 tablet (40 mg total) by mouth daily. Start taking on: April 11, 2022 What changed:  when to take this These instructions start on April 11, 2022. If you are unsure what to do until then, ask your doctor or other care provider.   IRON PO Take 1 tablet by mouth daily.   predniSONE 10 MG tablet Commonly known as: DELTASONE Take 4 tabs (40 mg total) daily x2 days, then 3 tabs (30 mg total) daily x2 days, then 2 tabs (20 mg total) daily x2 days, then 1 tab (10 mg total) daily x2 days, then stop. Start taking on: April 09, 2022   Stool Softener 100 MG capsule Generic drug: docusate sodium Take 100 mg by mouth 2 (two) times daily.   Vitamin D (Ergocalciferol) 1.25 MG (50000 UNIT) Caps capsule Commonly known as: DRISDOL Take 1 capsule (50,000 Units total) by mouth every 7 (seven) days.       Allergies  Allergen Reactions   Sulfa Antibiotics Hives and Rash  Procedures/Studies: CT Angio Chest PE W  and/or Wo Contrast  Result Date: 04/04/2022 CLINICAL DATA:  Shortness of breath, cough, hypoxia, elevated D-dimer EXAM: CT ANGIOGRAPHY CHEST WITH CONTRAST TECHNIQUE: Multidetector CT imaging of the chest was performed using the standard protocol during bolus administration of intravenous contrast. Multiplanar CT image reconstructions and MIPs were obtained to evaluate the vascular anatomy. RADIATION DOSE REDUCTION: This exam was performed according to the departmental dose-optimization program which includes automated exposure control, adjustment of the mA and/or kV according to patient size and/or use of iterative reconstruction technique. CONTRAST:  67mL OMNIPAQUE IOHEXOL 350 MG/ML SOLN COMPARISON:  CT chest dated 11/20/2021 FINDINGS: Cardiovascular: Satisfactory opacification the bilateral pulmonary arteries to the lobar level. Evaluation is constrained by respiratory motion and suboptimal bolus timing, with preferential opacification of the thoracic aorta. Within that constraint, there is no evidence central pulmonary embolism. No evidence thoracic aortic aneurysm or dissection. Atherosclerotic calcifications of the aortic arch. The heart is normal in size.  No pericardial effusion. Mediastinum/Nodes: No suspicious mediastinal lymphadenopathy. Visualized thyroid is unremarkable. Lungs/Pleura: Evaluation lung parenchyma is constrained by respiratory motion. Within that constraint, there is a stable 4 mm nodule in the posterior right upper lobe (series 11/image 44), likely benign given stability. Ground-glass opacity in the right upper lobe with platelike scarring (series 11/image 84), unchanged, favoring radiation changes. No focal consolidation. No pleural effusion or pneumothorax. Upper Abdomen: Visualized upper abdomen is grossly unremarkable. Musculoskeletal: Stable masslike appearance of the right pectoralis musculature (series 5/image 45), chronic, possibly related to prior injury with overlying fatty  muscular atrophy. Degenerative changes of the thoracic spine. Review of the MIP images confirms the above findings. IMPRESSION: No evidence of central pulmonary embolism. Radiation changes in the right upper lobe. Additional stable ancillary findings as above. Aortic Atherosclerosis (ICD10-I70.0). Electronically Signed   By: Julian Hy M.D.   On: 04/04/2022 00:45   DG Chest Portable 1 View  Result Date: 04/03/2022 CLINICAL DATA:  Short of breath for 2-3 weeks, worsening over the last 3 days, history of non-small cell lung cancer EXAM: PORTABLE CHEST 1 VIEW COMPARISON:  11/20/2021, 12/17/2019 FINDINGS: Single frontal view of the chest demonstrates a stable cardiac silhouette. Stable right upper lobe scarring. No acute airspace disease, effusion, or pneumothorax. No acute bony abnormalities. Loop recorder left anterior chest unchanged. IMPRESSION: 1. Stable right upper lobe scarring.  No acute airspace disease. Electronically Signed   By: Randa Ngo M.D.   On: 04/03/2022 17:16     Subjective: Overall feels much better compared to admission.  Intermittent mild and dry cough.  No chest pain or dyspnea.  Feels comfortable going home.  No other complaints reported.  Discharge Exam:  Vitals:   04/08/22 0802 04/08/22 0804 04/08/22 1257 04/08/22 1401  BP:   129/74   Pulse:   77   Resp: 19  19 (!) 21  Temp:   98.5 F (36.9 C)   TempSrc:   Oral   SpO2: 97% 98% 96% 97%  Weight:      Height:        General: Middle-age female, moderately built and morbidly obese, seen sitting comfortably in her reclining chair this morning and then subsequently seen ambulating comfortably and steadily with nursing team in the hallway. Cardiovascular: S1 & S2 heard, RRR, S1/S2 +. No murmurs, rubs, gallops or clicks. No JVD.  Chronic bilateral legs nonpitting edema up to the knees. Respiratory: Occasional bilateral rhonchi but otherwise good air entry and no crackles.  No increased work  of breathing even with  activity. Abdominal: Obese, non tender & soft. No organomegaly or masses appreciated. Normal bowel sounds heard. CNS: Alert and oriented. No focal deficits. Extremities: Symmetric normal power in all extremities.    The results of significant diagnostics from this hospitalization (including imaging, microbiology, ancillary and laboratory) are listed below for reference.     Microbiology: Recent Results (from the past 240 hour(s))  Resp Panel by RT-PCR (Flu A&B, Covid) Anterior Nasal Swab     Status: None   Collection Time: 04/03/22  8:58 PM   Specimen: Anterior Nasal Swab  Result Value Ref Range Status   SARS Coronavirus 2 by RT PCR NEGATIVE NEGATIVE Final    Comment: (NOTE) SARS-CoV-2 target nucleic acids are NOT DETECTED.  The SARS-CoV-2 RNA is generally detectable in upper respiratory specimens during the acute phase of infection. The lowest concentration of SARS-CoV-2 viral copies this assay can detect is 138 copies/mL. A negative result does not preclude SARS-Cov-2 infection and should not be used as the sole basis for treatment or other patient management decisions. A negative result may occur with  improper specimen collection/handling, submission of specimen other than nasopharyngeal swab, presence of viral mutation(s) within the areas targeted by this assay, and inadequate number of viral copies(<138 copies/mL). A negative result must be combined with clinical observations, patient history, and epidemiological information. The expected result is Negative.  Fact Sheet for Patients:  EntrepreneurPulse.com.au  Fact Sheet for Healthcare Providers:  IncredibleEmployment.be  This test is no t yet approved or cleared by the [place] FDA and  has been authorized for detection and/or diagnosis of SARS-CoV-2 by FDA under an Emergency Use Authorization (EUA). This EUA will remain  in effect (meaning this test can be used) for the  duration of the COVID-19 declaration under Section 564(b)(1) of the Act, 21 U.S.C.section 360bbb-3(b)(1), unless the authorization is terminated  or revoked sooner.       Influenza A by PCR NEGATIVE NEGATIVE Final   Influenza B by PCR NEGATIVE NEGATIVE Final    Comment: (NOTE) The Xpert Xpress SARS-CoV-2/FLU/RSV plus assay is intended as an aid in the diagnosis of influenza from Nasopharyngeal swab specimens and should not be used as a sole basis for treatment. Nasal washings and aspirates are unacceptable for Xpert Xpress SARS-CoV-2/FLU/RSV testing.  Fact Sheet for Patients: EntrepreneurPulse.com.au  Fact Sheet for Healthcare Providers: IncredibleEmployment.be  This test is not yet approved or cleared by the [place] FDA and has been authorized for detection and/or diagnosis of SARS-CoV-2 by FDA under an Emergency Use Authorization (EUA). This EUA will remain in effect (meaning this test can be used) for the duration of the COVID-19 declaration under Section 564(b)(1) of the Act, 21 U.S.C. section 360bbb-3(b)(1), unless the authorization is terminated or revoked.  Performed at [facility], [place] [address], [place], [place] [address]      Labs: CBC: Recent Labs  Lab 04/03/22 1800 04/04/22 0425 04/07/22 0522 04/08/22 0553  WBC 6.4 4.2 4.2 4.4  NEUTROABS 3.4  --   --   --   HGB 13.0 12.2 11.3* 11.9*  HCT 42.5 39.7 37.0 39.5  MCV 73.5* 73.1* 72.3* 73.4*  PLT 186 162 163 315    Basic Metabolic Panel: Recent Labs  Lab 04/03/22 1800 04/04/22 0425 04/07/22 0522 04/08/22 0553 04/08/22 1627  NA 142 143 140 139 137  K 4.2 4.7 5.1 5.5* 5.3*  CL 102 103 100 99 97*  CO2 29 30 30 29  29  GLUCOSE 124* 199* 147* 147* 125*  BUN 34* 35* 76* 77* 80*  CREATININE 1.67* 1.75* 2.12* 1.77* 1.74*  CALCIUM 9.1 9.2 8.1* 8.1* 8.3*  MG  --   --  2.1 2.1  --     Liver Function Tests: Recent Labs  Lab 04/04/22 0425  04/08/22 0553  AST 21 20  ALT 14 12  ALKPHOS 151* 107  BILITOT 0.4 0.3  PROT 7.0 6.5  ALBUMIN 3.6 3.3*   I discussed in detail with patient's son via phone, updated care and answered all questions.  Advised him regarding calling PCPs office for an early follow-up appointment with repeat labs.  Time coordinating discharge: 35 minutes  SIGNED:  Vernell Leep, MD,  FACP, [facility], [facility], [facility] (Care Management Physician Certified). [facility]  To contact the attending provider between 7A-7P or the covering provider during after hours 7P-7A, please log into the web site [email or web address] and access using universal [place] password for that web site. If you do not have the password, please call the hospital operator.

## 2022-04-08 NOTE — Care Management (Signed)
RNCM called to check  ETA status of portable  oxygen tank delivery.  Was informed  all of the oxygen was delivered to patients home. Asked patient if someone could bring a tank for discharge home. Patient insisted on leaving without the oxygen, stated she will get the oxygen when she get home.  Updated Nurse.

## 2022-04-08 NOTE — Care Management (Signed)
ED RNCM contacted by [facility] ED SW concerning late discharge needing home oxygen set for discharge.  Reviewed the orders and oxygen qualifying note in  chart.  Contacted Andree Coss with referral for Home oxygen, awaiting processing.  ED RNCM will follow up and update patient and nurse  of  oxygen delivery status.

## 2022-04-08 NOTE — Care Management (Signed)
Informed patient of the home oxygen set up process with [facility]. Patient is agreeable with waiting on  a portable tank for discharge. RNCM will update patient once notified by [facility].  Melissa Nurse also updated.

## 2022-04-08 NOTE — Progress Notes (Signed)
SATURATION QUALIFICATIONS: (This note is used to comply with regulatory documentation for home oxygen)  Patient Saturations on Room Air at Rest = 96%  Patient Saturations on Room Air while Ambulating = 88%  Patient Saturations on 3 Liters of oxygen while Ambulating = 97%  Please briefly explain why patient needs home oxygen: Pt was on 2L while ambulating and not moving out of the 88 range had to move to 3 liters to get to 97

## 2022-04-08 NOTE — Progress Notes (Addendum)
Physical Therapy Treatment Patient Details Name: Pamela [name] MRN: [id number] DOB: 28-Apr-1951 Today's Date: 04/08/2022   History of Present Illness Pamela [name] is a 71 y.o. female with medical history significant of HFpEF, COPD, sarcoidosis, NSCLC s/p SBRT 2012 -> long term remission, HTN, second hand smoke exposure in past (no longer), OSA.     Pt sent in to ED 04/03/22  from pulm clinic with SOB, hypoxia    PT Comments    Patient is eager to ambulate again today. Patient ambulated x 350' using her cane on 2 L [place]. SPO2 96%.  Patient hopes to Dc home tomorrow.   PT recommends patient have a bariatric rollator.   Recommendations for follow up therapy are one component of a multi-disciplinary discharge planning process, led by the attending physician.  Recommendations may be updated based on patient status, additional functional criteria and insurance authorization.  Follow Up Recommendations  Home health PT     Assistance Recommended at Discharge Intermittent Supervision/Assistance  Patient can return home with the following Assistance with cooking/housework;Assist for transportation;Help with stairs or ramp for entrance   Equipment Recommendations  Bariatric rollator.   Recommendations for Other Services       Precautions / Restrictions Precautions Precaution Comments: monitor sats     Mobility  Bed Mobility               General bed mobility comments: up in recliner    Transfers Overall transfer level: Modified independent Equipment used: Straight cane               General transfer comment: used armrests to power up    Ambulation/Gait Ambulation/Gait assistance: Supervision Gait Distance (Feet): 350 Feet Assistive device: Straight cane Gait Pattern/deviations: Step-through pattern, Decreased stride length Gait velocity: decr Gait velocity interpretation: <1.31 ft/sec, indicative of household ambulator   General Gait Details: steady, no loss of  balance,   Stairs             Wheelchair Mobility    Modified Rankin (Stroke Patients Only)       Balance Overall balance assessment: Mild deficits observed, not formally tested                                          Cognition Arousal/Alertness: Awake/alert                                              Exercises      General Comments        Pertinent Vitals/Pain Pain Assessment Pain Assessment: No/denies pain    Home Living                          Prior Function            PT Goals (current goals can now be found in the care plan section) Progress towards PT goals: Progressing toward goals    Frequency    Min 3X/week      PT Plan Current plan remains appropriate    Co-evaluation              AM-PAC PT "6 Clicks" Mobility   Outcome Measure  Help needed turning from your back to your side while in a  flat bed without using bedrails?: A Little Help needed moving from lying on your back to sitting on the side of a flat bed without using bedrails?: A Little Help needed moving to and from a bed to a chair (including a wheelchair)?: None Help needed standing up from a chair using your arms (e.g., wheelchair or bedside chair)?: None Help needed to walk in hospital room?: None Help needed climbing 3-5 steps with a railing? : A Little 6 Click Score: 21    End of Session Equipment Utilized During Treatment: Oxygen Activity Tolerance: Patient tolerated treatment well Patient left: in chair;with call bell/phone within reach Nurse Communication: Mobility status PT Visit Diagnosis: Unsteadiness on feet (R26.81);Difficulty in walking, not elsewhere classified (R26.2)     Time: 9326-7124 PT Time Calculation (min) (ACUTE ONLY): 16 min  Charges:  $Gait Training: 8-22 mins                     [place] Office [phone] Weekend pager-[phone]    Pamela [name] 04/08/2022, 2:56 PM

## 2022-04-08 NOTE — Plan of Care (Signed)

## 2022-04-09 ENCOUNTER — Encounter: Payer: Self-pay | Admitting: *Deleted

## 2022-04-09 ENCOUNTER — Telehealth: Payer: Self-pay | Admitting: *Deleted

## 2022-04-09 ENCOUNTER — Other Ambulatory Visit: Payer: Self-pay

## 2022-04-09 DIAGNOSIS — Z85118 Personal history of other malignant neoplasm of bronchus and lung: Secondary | ICD-10-CM

## 2022-04-09 DIAGNOSIS — J441 Chronic obstructive pulmonary disease with (acute) exacerbation: Secondary | ICD-10-CM

## 2022-04-09 DIAGNOSIS — I5032 Chronic diastolic (congestive) heart failure: Secondary | ICD-10-CM

## 2022-04-09 DIAGNOSIS — I10 Essential (primary) hypertension: Secondary | ICD-10-CM

## 2022-04-09 NOTE — Patient Outreach (Addendum)
Care Coordination TOC Note Transition Care Management Follow-up Telephone Call Date of discharge and from where: Wednesday, 04/08/22 [facility], COPD exacerbation How have you been since you were released from the hospital? "I am doing real good; I feel much better and it was so good to sleep in my own bed last night; I am up and about and I just got my medicine delivered from the pharmacy.  My family is looking after me, but I am able to do most things on my own, but they are here for me if I need anything.  I have not yet heard from [facility], but I have their number from when they were visiting me at home, so I am going to call them if I haven't heard from them by tomorrow- I know it takes a couple of days before they get the order to come backout and see me.  They arranged for a social worker to come to my house and help me find out if I qualify for/ apply for food stamps and maybe get on Medicaid.  I am going to call my pulmonary doctor and my PCP after we hang up so I can schedule my hospital follow up visits like they told me to.  I am using the home oxygen- it was waiting for me at home when I got released last night" Any questions or concerns? No  Items Reviewed: Did the pt receive and understand the discharge instructions provided? Yes  reviewed hospital discharge instructions with patient and she verbalizes an excellent understanding of same Medications obtained and verified? Yes  confirms medications from hospital discharge were "just delivered to me" Other? No  Any new allergies since your discharge? No  Dietary orders reviewed? Yes-- patient follows LOW SALT diet and endorses adherence to this diet Do you have support at home? Yes  family assisting with care needs as needed/ indicated  Home Care and Equipment/Supplies: Were home health services ordered? yes If so, what is the name of the agency? [place]  Has the agency set up a time to come to the patient's home? No-  patient confirms she has phone number for [facility] and verbalizes plans to contact them tomorrow if they have not called by then Were any new equipment or medical supplies ordered?  Yes: home O2 What is the name of the medical supply agency? [employer] Were you able to get the supplies/equipment? yes Do you have any questions related to the use of the equipment or supplies? No  Functional Questionnaire: (I = Independent and D = Dependent) ADLs: I  family assisting with care needs as needed/ indicated  Bathing/Dressing- I  Meal Prep- I  family assisting with care needs as needed/ indicated; patient would like resources for food acquisition- states she had "Mom[employer] Meals" delivered in the past and "did not like the food;" [employer] Care guide referral placed accordingly  Eating- I  Maintaining continence- I  Transferring/Ambulation- I  Managing Meds- I  Follow up appointments reviewed:  PCP Hospital f/u appt confirmed? No  Scheduled to see - on - @ - patient reports she will independently schedule; placed request to facilitate scheduling as agreed during [facility] call [facility] f/u appt confirmed? Yes  Scheduled to see Pulmonary provider on Tuesday 04/10/22 @ 11:00 am- verified patient independently scheduled appointment post-TOC call Are transportation arrangements needed? No  uses [facility] If their condition worsens, is the pt aware to call PCP or go to the Emergency Dept.? Yes Was  the patient provided with contact information for the PCP's office or ED? Yes Was to pt encouraged to call back with questions or concerns? Yes  SDOH assessments and interventions completed:   Yes  Care Coordination Interventions Activated:  Yes   Care Coordination Interventions:  Placed request for facilitation of post-hospital PCP office visit; placed referral for [facility] for food resources; confirmed that [facility] Liaison placed referral for RN [place]  with scheduled visit- encouraged patient to engage with RN CM Care Coordinator accordingly    Encounter Outcome:  Pt. Visit Completed    Oneta Rack, RN, BSN, CCRN Alumnus RN CM Care Coordination/ Transition of [facility] [phone]: direct office

## 2022-04-09 NOTE — Patient Outreach (Signed)
Armando Lauman Aug 14, 1950 [id number]  [facility] [employer] [ACO] Patient: Medicare ACO REACH  Primary Care Provider: Dorena Dew, FNP, [facility] Patient [facility] Liaison coverage for [facility]  Patient evaluated for community based chronic complex disease management services with [facility] as a benefit of patient's [employer]. Spoke with patient, HIPAA verified with patient's name, date of birth and house address verified to explain [facility] services.   Patient confirms she needs post hospital follow up.  She states she is awaiting home health to follow up. Patient states, "I discharge late last night but I did get my oxygen and I need to get my new medicines as I am not able to pick it up until someone gets off of work today.   Patient will receive post hospital transition call and will be evaluated care coordination needs.     Of note, [facility] Care Management services does not replace or interfere with any services that are arranged by inpatient case management or social work.  For additional questions or referrals please contact:    Natividad Brood, RN BSN [place]  [phone] business mobile phone Toll free office [phone]  *[place]  [phone] Fax number: [phone] [email or web address]

## 2022-04-09 NOTE — Chronic Care Management (AMB) (Signed)
  Care Coordination   Note   04/09/2022 Name: Vinita Prentiss MRN: [id number] DOB: 08/13/1950  [place] Landgrebe is a 71 y.o. year old female who sees Dorena Dew, FNP for primary care. I reached out to Shellye Zandi by phone today to offer care coordination services.  Ms. Sweatt was given information about Care Coordination services today including:   The Care Coordination services include support from the care team which includes your Nurse Coordinator, Clinical Social Worker, or Pharmacist.  The Care Coordination team is here to help remove barriers to the health concerns and goals most important to you. Care Coordination services are voluntary, and the patient may decline or stop services at any time by request to their care team member.   Care Coordination Consent Status: Patient agreed to services and verbal consent obtained.   Follow up plan:  Telephone appointment with care coordination team member scheduled for:  04/10/22  Encounter Outcome:  Pt. Scheduled  [place]  Direct Dial: [phone]

## 2022-04-10 ENCOUNTER — Ambulatory Visit: Payer: Self-pay

## 2022-04-10 ENCOUNTER — Telehealth: Payer: Self-pay

## 2022-04-10 DIAGNOSIS — Z7982 Long term (current) use of aspirin: Secondary | ICD-10-CM | POA: Diagnosis not present

## 2022-04-10 DIAGNOSIS — I700 Atherosclerosis of aorta: Secondary | ICD-10-CM | POA: Diagnosis not present

## 2022-04-10 DIAGNOSIS — M1030 Gout due to renal impairment, unspecified site: Secondary | ICD-10-CM | POA: Diagnosis not present

## 2022-04-10 DIAGNOSIS — Z6841 Body Mass Index (BMI) 40.0 and over, adult: Secondary | ICD-10-CM | POA: Diagnosis not present

## 2022-04-10 DIAGNOSIS — D509 Iron deficiency anemia, unspecified: Secondary | ICD-10-CM | POA: Diagnosis not present

## 2022-04-10 DIAGNOSIS — J9621 Acute and chronic respiratory failure with hypoxia: Secondary | ICD-10-CM | POA: Diagnosis not present

## 2022-04-10 DIAGNOSIS — N179 Acute kidney failure, unspecified: Secondary | ICD-10-CM | POA: Diagnosis not present

## 2022-04-10 DIAGNOSIS — D573 Sickle-cell trait: Secondary | ICD-10-CM | POA: Diagnosis not present

## 2022-04-10 DIAGNOSIS — I5032 Chronic diastolic (congestive) heart failure: Secondary | ICD-10-CM | POA: Diagnosis not present

## 2022-04-10 DIAGNOSIS — Z9181 History of falling: Secondary | ICD-10-CM | POA: Diagnosis not present

## 2022-04-10 DIAGNOSIS — E875 Hyperkalemia: Secondary | ICD-10-CM | POA: Diagnosis not present

## 2022-04-10 DIAGNOSIS — J441 Chronic obstructive pulmonary disease with (acute) exacerbation: Secondary | ICD-10-CM | POA: Diagnosis not present

## 2022-04-10 DIAGNOSIS — R7303 Prediabetes: Secondary | ICD-10-CM | POA: Diagnosis not present

## 2022-04-10 DIAGNOSIS — N1832 Chronic kidney disease, stage 3b: Secondary | ICD-10-CM | POA: Diagnosis not present

## 2022-04-10 DIAGNOSIS — Z8616 Personal history of COVID-19: Secondary | ICD-10-CM | POA: Diagnosis not present

## 2022-04-10 DIAGNOSIS — Z7722 Contact with and (suspected) exposure to environmental tobacco smoke (acute) (chronic): Secondary | ICD-10-CM | POA: Diagnosis not present

## 2022-04-10 DIAGNOSIS — Z85118 Personal history of other malignant neoplasm of bronchus and lung: Secondary | ICD-10-CM | POA: Diagnosis not present

## 2022-04-10 DIAGNOSIS — Z9981 Dependence on supplemental oxygen: Secondary | ICD-10-CM | POA: Diagnosis not present

## 2022-04-10 DIAGNOSIS — J9612 Chronic respiratory failure with hypercapnia: Secondary | ICD-10-CM | POA: Diagnosis not present

## 2022-04-10 DIAGNOSIS — I130 Hypertensive heart and chronic kidney disease with heart failure and stage 1 through stage 4 chronic kidney disease, or unspecified chronic kidney disease: Secondary | ICD-10-CM | POA: Diagnosis not present

## 2022-04-10 NOTE — Telephone Encounter (Signed)
   Telephone encounter was:  Unsuccessful.  04/10/2022 Name: Pamela [name] MRN: [id number] DOB: 11/04/1950  Unsuccessful outbound call made today to assist with:  Food Insecurity and Financial Difficulties related to financial strain  Outreach Attempt:  1st Attempt  A HIPAA compliant voice message was left requesting a return call.  Instructed patient to call back    [place], [place] [facility]  [phone] [address], [place], [place] [address] Phone: [phone] Email: [email or web address]

## 2022-04-10 NOTE — Patient Outreach (Signed)
  Care Coordination   Initial Visit Note   04/10/2022 Name: Pamela [name] MRN: [id number] DOB: [birth year]  Pamela [name] is a 71 y.o. year old female who sees Dorena Dew, FNP for primary care. I spoke with  Pamela [name] by phone today.  What matters to the patients health and wellness today?  Recent discharged from hospital 04/03/22-04/08/22 with COPD exacerbation. Patient reports she is taking it one day at a time. Reports home health starting care today. She states she is using her oxygen. She has a follow up appointment with pulmonologist. And called to schedule an appointment with primary care provider. She reports she is awaiting a return call. She is without any questions or concerns at this time.    Goals Addressed             This Visit's Progress    Maintain and/or improve health post hospitalization       Care Coordination Interventions: Confirmed patient has been in contact with home health agency, [place] with getting a hospital follow up appointment with primary care provider-04/21/22 at 11:20 am. Patient notified. Advised patient to call provider if any questions or concerns prior to appointment Confirmed patient has appointment with pulmonologist for follow up. Confirmed patient has transportation to appointment (uses [facility]) and has portable oxygen tank for travel.         SDOH assessments and interventions completed:  Yes  SDOH Interventions Today    Flowsheet Row Most Recent Value  SDOH Interventions   Housing Interventions Intervention Not Indicated  Utilities Interventions Intervention Not Indicated        Care Coordination Interventions Activated:  Yes  Care Coordination Interventions:  Yes, provided   Follow up plan: No further intervention required.   Encounter Outcome:  Pt. Visit Completed   Thea Silversmith, RN, MSN, BSN, [place] Coordinator [phone]

## 2022-04-14 ENCOUNTER — Encounter: Payer: Self-pay | Admitting: Adult Health

## 2022-04-14 ENCOUNTER — Ambulatory Visit (INDEPENDENT_AMBULATORY_CARE_PROVIDER_SITE_OTHER): Payer: Medicare Other | Admitting: Adult Health

## 2022-04-14 DIAGNOSIS — J45901 Unspecified asthma with (acute) exacerbation: Secondary | ICD-10-CM

## 2022-04-14 DIAGNOSIS — D869 Sarcoidosis, unspecified: Secondary | ICD-10-CM

## 2022-04-14 DIAGNOSIS — J9612 Chronic respiratory failure with hypercapnia: Secondary | ICD-10-CM | POA: Diagnosis not present

## 2022-04-14 DIAGNOSIS — J441 Chronic obstructive pulmonary disease with (acute) exacerbation: Secondary | ICD-10-CM

## 2022-04-14 DIAGNOSIS — J9611 Chronic respiratory failure with hypoxia: Secondary | ICD-10-CM

## 2022-04-14 DIAGNOSIS — I639 Cerebral infarction, unspecified: Secondary | ICD-10-CM

## 2022-04-14 DIAGNOSIS — N1832 Chronic kidney disease, stage 3b: Secondary | ICD-10-CM

## 2022-04-14 MED ORDER — MONTELUKAST SODIUM 10 MG PO TABS: 10.0000 mg | ORAL_TABLET | Freq: Every day | ORAL | 11 refills | Status: DC

## 2022-04-14 MED ORDER — TRELEGY ELLIPTA 200-62.5-25 MCG/ACT IN AEPB: 1.0000 | INHALATION_SPRAY | Freq: Every day | RESPIRATORY_TRACT | 5 refills | Status: DC

## 2022-04-14 NOTE — Progress Notes (Signed)
@Patient  ID: Pamela [name], female    DOB: 05-07-1951, 71 y.o.   MRN: [id number]  Chief Complaint  Patient presents with   [facility] follow up.     Referring provider: Dorena Dew, FNP  HPI: 71 year old female never smoker followed for asthma with an allergic phenotype, sarcoidosis, history of lung cancer diagnosed in 2012 status post SBRT Previously on Chronic Oxygen - was on from 2013-2018 , restarted 03/2022.  Medical history significant for right anterior chest wall mass/right pectoralis minor muscle mass (biopsy done in 2015 nondiagnostic, PET scan October 2016 shows no change in metabolic activity or size suggestive of benign etiology), chronic kidney disease, congestive heart failure  TEST/EVENTS :  CT chest Nov 20, 2021 similar posttreatment fibrosis/consolidation with volume loss in the right upper lobe.  No evidence of recurrence, tiny right upper lobe pulmonary nodule no suspicious pulmonary nodules.  A 5.5 x 2.1 cm soft tissue mass in the right pectoralis major and minor musculature.    pfts 07/31/13  FEV1  1.53 (58%) with ratio 66 > p saba ratio 74 FEV1 1.68 (64%)    FENO 06/01/2016  =   85    Spirometry 02/22/2018  FEV1 1.23 (57%)  Ratio 71 with min curvature  - FENO 02/22/2018  = 61   Split night sleep study in 07/2014 showed mild OSA only with AHI 6.9. O2 sats nml on 2l/m .    She also carries dx of Sarcoid from previous bx. (followed previously by Dr. Bartholome Bill [facility], with lymphadenopathy in chest , abd w/ possible spleen and liver involvement.   04/14/2022 Follow up ; Asthma , Post hospital follow up  Patient presents for a 2-week follow-up.  Patient was seen last visit for an acute office visit.  She had a severe asthma exacerbation and associated hypoxemia.  She was recommended to go to the emergency room.  She was transported from our office via EMS.She was admitted and treated for a asthmatic bronchitic exacerbation with IV steroids and an  biotics, nebulized bronchodilators.  Viral panel was negative.  She was discharged on steroid taper , has few days left.  Discharged on Oxygen on 2l/m   CT chest neg for PE, stable RUL scarring likely radiation changes.  Has not started back on advair . We discussed the importance of medication compliance and maintenance regimen .  Since discharge she is feeling better with decreased cough and wheezing.  She says oxygen is helping.  Allergies  Allergen Reactions   Sulfa Antibiotics Hives and Rash    Immunization History  Administered Date(s) Administered   Influenza,inj,Quad PF,6+ Mos 04/13/2014, 04/16/2015, 04/03/2016, 05/19/2017, 06/15/2018, 05/14/2019   PFIZER(Purple Top)SARS-COV-2 Vaccination 10/14/2019, 11/08/2019, 07/08/2020, 03/06/2021   Pneumococcal Conjugate-13 04/03/2016   Pneumococcal Polysaccharide-23 04/13/2014, 12/26/2019   Tdap 01/02/2015   Zoster, Live 05/21/2014    Past Medical History:  Diagnosis Date   Arthritis    Asthma    Cancer ([place])    lung, adenocarcinoma right lung 2012   CHF (congestive heart failure) (HCC)    Preserved EF   Congenital single kidney    With chronic kidney disease   COPD (chronic obstructive pulmonary disease) ([place])    Gout    Hypertension    Lung cancer ([place]) 2012   Right upper lobe lung adenocarcinoma diagnosed with needle biopsy treated by SBRT finished treatment April 2013 has been monitored since   Mass of chest wall, right    Right chest  wall mass 7.3 cm biopsy on 12/13/2013. Patient notes it was consistent with sarcoidosis but actual pathology results not available.   Oxygen deficiency    Sarcoidosis    Sickle cell trait (HCC)    Sleep apnea     Tobacco History: Social History   Tobacco Use  Smoking Status Never  Smokeless Tobacco Never   Counseling given: Not Answered   Outpatient Medications Prior to Visit  Medication Sig Dispense Refill   predniSONE (DELTASONE) 10 MG tablet Take 4 tabs (40 mg total) daily x2  days, then 3 tabs (30 mg total) daily x2 days, then 2 tabs (20 mg total) daily x2 days, then 1 tab (10 mg total) daily x2 days, then stop. 20 tablet 0   acetaminophen (TYLENOL) 500 MG tablet Take 1,000 mg by mouth every 6 (six) hours as needed for mild pain.  (Patient not taking: Reported on 04/14/2022)     albuterol (PROAIR HFA) 108 (90 Base) MCG/ACT inhaler Inhale 2 puffs into the lungs every 4 (four) hours as needed for wheezing or shortness of breath. (Patient not taking: Reported on 04/14/2022) 8 g 3   albuterol (PROVENTIL) (2.5 MG/3ML) 0.083% nebulizer solution USE 1 VIAL IN NEBULIZER EVERY 6 HOURS AS NEEDED FOR WHEEZING FOR SHORTNESS OF BREATH (Patient not taking: Reported on 04/14/2022) 300 mL 2   aspirin EC 81 MG tablet Take 1 tablet (81 mg total) by mouth daily. Swallow whole. (Patient not taking: Reported on 04/14/2022) 30 tablet 11   calcitRIOL (ROCALTROL) 0.25 MCG capsule Take 1 capsule (0.25 mcg total) by mouth daily. (Patient not taking: Reported on 04/14/2022) 90 capsule 0   docusate sodium (STOOL SOFTENER) 100 MG capsule Take 100 mg by mouth 2 (two) times daily. (Patient not taking: Reported on 04/14/2022)     febuxostat (ULORIC) 40 MG tablet Take 1 tablet (40 mg total) by mouth daily. (Patient not taking: Reported on 04/14/2022) 90 tablet 1   Ferrous Sulfate (IRON PO) Take 1 tablet by mouth daily. (Patient not taking: Reported on 04/14/2022)     furosemide (LASIX) 40 MG tablet Take 1 tablet (40 mg total) by mouth daily. (Patient not taking: Reported on 04/14/2022)     Vitamin D, Ergocalciferol, (DRISDOL) 1.25 MG (50000 UNIT) CAPS capsule Take 1 capsule (50,000 Units total) by mouth every 7 (seven) days. (Patient not taking: Reported on 04/14/2022) 5 capsule 0   Fluticasone-Salmeterol 55-14 MCG/ACT AEPB Take 2 puffs first thing in am and then another 2 puffs about 12 hours later. (Patient not taking: Reported on 04/14/2022) 1 each 11   No facility-administered medications prior to visit.      Review of Systems:   Constitutional:   No  weight loss, night sweats,  Fevers, chills,  +fatigue, or  lassitude.  HEENT:   No headaches,  Difficulty swallowing,  Tooth/dental problems, or  Sore throat,                No sneezing, itching, ear ache, nasal congestion, post nasal drip,   CV:  No chest pain,  Orthopnea, PND, anasarca, dizziness, palpitations, syncope.   GI  No heartburn, indigestion, abdominal pain, nausea, vomiting, diarrhea, change in bowel habits, loss of appetite, bloody stools.   Resp:  No excess mucus, no productive cough,  No non-productive cough,  No coughing up of blood.  No change in color of mucus.  No wheezing.  No chest wall deformity  Skin: no rash or lesions.  GU: no dysuria, change in color of urine,  no urgency or frequency.  No flank pain, no hematuria   MS:  No joint pain or swelling.  No decreased range of motion.  No back pain.    Physical Exam  BP 124/64 (BP Location: Left Arm, Patient Position: Sitting, Cuff Size: Large)   Pulse 69   Temp 97.6 F (36.4 C) (Oral)   Ht 5\' 6"  (1.676 m)   Wt (!) 340 lb (154.2 kg)   SpO2 95%   BMI 54.88 kg/m   GEN: A/Ox3; pleasant , NAD, well nourished , On O2 , walker , BMI 54.    HEENT:  [place]/AT,   NOSE-clear, THROAT-clear, no lesions, no postnasal drip or exudate noted.   NECK:  Supple w/ fair ROM; no JVD; normal carotid impulses w/o bruits; no thyromegaly or nodules palpated; no lymphadenopathy.    RESP  Clear  P & A; w/o, wheezes/ rales/ or rhonchi. no accessory muscle use, no dullness to percussion  CARD:  RRR, no m/r/g, no peripheral edema, pulses intact, no cyanosis or clubbing. Lymphedema bilateral legs   GI:   Soft & nt; nml bowel sounds; no organomegaly or masses detected.   Musco: Warm bil, no deformities or joint swelling noted.   Neuro: alert, no focal deficits noted.    Skin: Warm, no lesions or rashes    Lab Results:  CBC    Component Value Date/Time   WBC 4.4 04/08/2022  0553   RBC 5.38 (H) 04/08/2022 0553   HGB 11.9 (L) 04/08/2022 0553   HGB 11.2 (L) 12/12/2021 0848   HGB 11.0 (L) 08/29/2019 0941   HGB 11.8 05/31/2017 1050   HCT 39.5 04/08/2022 0553   HCT 37.1 01/29/2022 0940   HCT 37.5 05/31/2017 1050   PLT 171 04/08/2022 0553   PLT 150 12/12/2021 0848   PLT 195 08/29/2019 0941   MCV 73.4 (L) 04/08/2022 0553   MCV 72 (L) 08/29/2019 0941   MCV 71.2 (L) 05/31/2017 1050   MCH 22.1 (L) 04/08/2022 0553   MCHC 30.1 04/08/2022 0553   RDW 15.6 (H) 04/08/2022 0553   RDW 16.2 (H) 08/29/2019 0941   RDW 15.1 (H) 05/31/2017 1050   LYMPHSABS 1.4 04/03/2022 1800   LYMPHSABS 1.0 08/29/2019 0941   LYMPHSABS 1.2 05/31/2017 1050   MONOABS 0.3 04/03/2022 1800   MONOABS 0.4 05/31/2017 1050   EOSABS 1.4 (H) 04/03/2022 1800   EOSABS 1.0 (H) 08/29/2019 0941   BASOSABS 0.0 04/03/2022 1800   BASOSABS 0.0 08/29/2019 0941   BASOSABS 0.0 05/31/2017 1050    BMET    Component Value Date/Time   NA 137 04/08/2022 1627   NA 142 01/29/2022 0940   NA 142 05/31/2017 1050   K 5.3 (H) 04/08/2022 1627   K 4.2 05/31/2017 1050   CL 97 (L) 04/08/2022 1627   CL 101 09/20/2018 0000   CO2 29 04/08/2022 1627   CO2 27 09/20/2018 0000   CO2 30 (H) 05/31/2017 1050   GLUCOSE 125 (H) 04/08/2022 1627   GLUCOSE 94 05/31/2017 1050   BUN 80 (H) 04/08/2022 1627   BUN 23 01/29/2022 0940   BUN 26.0 05/31/2017 1050   CREATININE 1.74 (H) 04/08/2022 1627   CREATININE 1.89 (H) 12/12/2021 0848   CREATININE 1.6 (H) 05/31/2017 1050   CALCIUM 8.3 (L) 04/08/2022 1627   CALCIUM 8.2 09/20/2018 0000   CALCIUM 9.0 05/31/2017 1050   GFRNONAA 31 (L) 04/08/2022 1627   GFRNONAA 28 (L) 12/12/2021 0848   GFRNONAA 36 (L) 05/19/2017 1210  GFRAA 20 (L) 04/26/2020 1011   GFRAA 37 (L) 06/23/2018 1300   GFRAA 41 (L) 05/19/2017 1210    BNP    Component Value Date/Time   BNP 22.4 04/03/2022 2058   BNP 65.7 08/31/2014 0937    ProBNP    Component Value Date/Time   PROBNP 494 (H) 04/26/2020  1011   PROBNP 1,193.0 (H) 06/11/2014 1929    Imaging: CT Angio Chest PE W and/or Wo Contrast  Result Date: 04/04/2022 CLINICAL DATA:  Shortness of breath, cough, hypoxia, elevated D-dimer EXAM: CT ANGIOGRAPHY CHEST WITH CONTRAST TECHNIQUE: Multidetector CT imaging of the chest was performed using the standard protocol during bolus administration of intravenous contrast. Multiplanar CT image reconstructions and MIPs were obtained to evaluate the vascular anatomy. RADIATION DOSE REDUCTION: This exam was performed according to the departmental dose-optimization program which includes automated exposure control, adjustment of the mA and/or kV according to patient size and/or use of iterative reconstruction technique. CONTRAST:  44mL OMNIPAQUE IOHEXOL 350 MG/ML SOLN COMPARISON:  CT chest dated 11/20/2021 FINDINGS: Cardiovascular: Satisfactory opacification the bilateral pulmonary arteries to the lobar level. Evaluation is constrained by respiratory motion and suboptimal bolus timing, with preferential opacification of the thoracic aorta. Within that constraint, there is no evidence central pulmonary embolism. No evidence thoracic aortic aneurysm or dissection. Atherosclerotic calcifications of the aortic arch. The heart is normal in size.  No pericardial effusion. Mediastinum/Nodes: No suspicious mediastinal lymphadenopathy. Visualized thyroid is unremarkable. Lungs/Pleura: Evaluation lung parenchyma is constrained by respiratory motion. Within that constraint, there is a stable 4 mm nodule in the posterior right upper lobe (series 11/image 44), likely benign given stability. Ground-glass opacity in the right upper lobe with platelike scarring (series 11/image 84), unchanged, favoring radiation changes. No focal consolidation. No pleural effusion or pneumothorax. Upper Abdomen: Visualized upper abdomen is grossly unremarkable. Musculoskeletal: Stable masslike appearance of the right pectoralis musculature (series  5/image 45), chronic, possibly related to prior injury with overlying fatty muscular atrophy. Degenerative changes of the thoracic spine. Review of the MIP images confirms the above findings. IMPRESSION: No evidence of central pulmonary embolism. Radiation changes in the right upper lobe. Additional stable ancillary findings as above. Aortic Atherosclerosis (ICD10-I70.0). Electronically Signed   By: Julian Hy M.D.   On: 04/04/2022 00:45   DG Chest Portable 1 View  Result Date: 04/03/2022 CLINICAL DATA:  Short of breath for 2-3 weeks, worsening over the last 3 days, history of non-small cell lung cancer EXAM: PORTABLE CHEST 1 VIEW COMPARISON:  11/20/2021, 12/17/2019 FINDINGS: Single frontal view of the chest demonstrates a stable cardiac silhouette. Stable right upper lobe scarring. No acute airspace disease, effusion, or pneumothorax. No acute bony abnormalities. Loop recorder left anterior chest unchanged. IMPRESSION: 1. Stable right upper lobe scarring.  No acute airspace disease. Electronically Signed   By: Randa Ngo M.D.   On: 04/03/2022 17:16   CUP PACEART REMOTE DEVICE CHECK  Result Date: 03/31/2022 ILR summary report received. Battery status OK. Normal device function. No new symptom, tachy, brady, or pause episodes. One previously viewed AF episode. AF burden is 0% of the time.  Monthly summary reports and ROV/PRN Kathy Breach, RN, CCDS, [employer]        Latest Ref Rng & Units 08/30/2018   10:03 AM 07/19/2014   11:01 AM  PFT Results  FVC-Pre L 1.92  2.32   FVC-Predicted Pre % 69  85   FVC-Post L 2.13  2.36   FVC-Predicted Post % 77  86  Pre FEV1/FVC % % 83  71   Post FEV1/FCV % % 79  85   FEV1-Pre L 1.59  1.64   FEV1-Predicted Pre % 74  76   FEV1-Post L 1.69  2.00   DLCO uncorrected ml/min/mmHg 23.90  18.43   DLCO UNC% % 112  71   DLVA Predicted % 154  101     Lab Results  Component Value Date   NITRICOXIDE 56 08/30/2018        Assessment &  Plan:   Asthma, chronic obstructive, with acute exacerbation (HCC) Acute asthmatic bronchitic exacerbation.  Patient has severe persistent asthma with allergic phenotype.  Recent hospitalization.  She has clinically improving.  Encourage patient on medication compliance.  We will change Advair to Trelegy inhaler. Add in Singulair and Zyrtec.  If patient continues to have ongoing flareups would consider biologic therapy such as Dupixent or Fasenra Needs PFTs on return  Plan  Patient Instructions  Finish prednisone as directed.  Stop Advair  Begin Trelegy 1 puff daily  ,rinse after use.  Begin Singulair 10mg  At bedtime  Begin Zyrtec 10mg  daily  Albuterol inhaler or neb As needed   Continue on Oxygen 2l/m .  Follow up with in 4 weeks with PFT with Dr. Melvyn Novas or  Tarika Mckethan NP  Please contact office for sooner follow up if symptoms do not improve or worsen or seek emergency care        Chronic respiratory failure with hypoxia and hypercapnia ([place]) Recent hospitalization requiring oxygen at discharge.  Patient is continue on oxygen to maintain O2 saturations greater than 88 to 90%.     Rexene Edison, NP 04/14/2022

## 2022-04-14 NOTE — Assessment & Plan Note (Signed)
Continue follow-up with primary care 

## 2022-04-14 NOTE — Patient Instructions (Addendum)
Finish prednisone as directed.  Stop Advair  Begin Trelegy 1 puff daily  ,rinse after use.  Begin Singulair 10mg  At bedtime  Begin Zyrtec 10mg  daily  Albuterol inhaler or neb As needed   Continue on Oxygen 2l/m .  Follow up with in 4 weeks with PFT with Dr. Melvyn Novas or  Shelena Castelluccio NP  Please contact office for sooner follow up if symptoms do not improve or worsen or seek emergency care

## 2022-04-14 NOTE — Assessment & Plan Note (Signed)
Appears stable.  Recent CT chest showed no significant adenopathy or parenchymal changes

## 2022-04-14 NOTE — Assessment & Plan Note (Addendum)
Acute asthmatic bronchitic exacerbation.  Patient has severe persistent asthma with allergic phenotype.  Recent hospitalization.  She has clinically improving.  Encourage patient on medication compliance.  We will change Advair to Trelegy inhaler. Add in Singulair and Zyrtec.  If patient continues to have ongoing flareups would consider biologic therapy such as Dupixent or Fasenra Needs PFTs on return  Plan  Patient Instructions  Finish prednisone as directed.  Stop Advair  Begin Trelegy 1 puff daily  ,rinse after use.  Begin Singulair 10mg  At bedtime  Begin Zyrtec 10mg  daily  Albuterol inhaler or neb As needed   Continue on Oxygen 2l/m .  Follow up with in 4 weeks with PFT with Dr. Melvyn Novas or  Niajah Sipos NP  Please contact office for sooner follow up if symptoms do not improve or worsen or seek emergency care

## 2022-04-14 NOTE — Assessment & Plan Note (Signed)
Recent hospitalization requiring oxygen at discharge.  Patient is continue on oxygen to maintain O2 saturations greater than 88 to 90%.

## 2022-04-15 ENCOUNTER — Telehealth: Payer: Self-pay

## 2022-04-15 DIAGNOSIS — J9621 Acute and chronic respiratory failure with hypoxia: Secondary | ICD-10-CM | POA: Diagnosis not present

## 2022-04-15 DIAGNOSIS — J9612 Chronic respiratory failure with hypercapnia: Secondary | ICD-10-CM | POA: Diagnosis not present

## 2022-04-15 DIAGNOSIS — I5032 Chronic diastolic (congestive) heart failure: Secondary | ICD-10-CM | POA: Diagnosis not present

## 2022-04-15 DIAGNOSIS — I130 Hypertensive heart and chronic kidney disease with heart failure and stage 1 through stage 4 chronic kidney disease, or unspecified chronic kidney disease: Secondary | ICD-10-CM | POA: Diagnosis not present

## 2022-04-15 DIAGNOSIS — N1832 Chronic kidney disease, stage 3b: Secondary | ICD-10-CM | POA: Diagnosis not present

## 2022-04-15 DIAGNOSIS — J441 Chronic obstructive pulmonary disease with (acute) exacerbation: Secondary | ICD-10-CM | POA: Diagnosis not present

## 2022-04-15 NOTE — Telephone Encounter (Signed)
   Telephone encounter was:  Unsuccessful.  04/15/2022 Name: Pamela [name] MRN: [id number] DOB: [birth year]  Unsuccessful outbound call made today to assist with:  Food Insecurity and Financial Difficulties related to financial strain  Outreach Attempt:  2nd Attempt  A HIPAA compliant voice message was left requesting a return call.  Instructed patient to call back at earliest convenience  .    [place], Care Management  [phone] [address], [place], [place] [address] Phone: [phone] Email: [email or web address]

## 2022-04-16 ENCOUNTER — Ambulatory Visit: Payer: Self-pay

## 2022-04-16 ENCOUNTER — Telehealth: Payer: Self-pay

## 2022-04-16 NOTE — Patient Outreach (Signed)
  Care Coordination   04/16/2022 Name: Pamela [name] MRN: [id number] DOB: 1951-05-09   Care Coordination Outreach Attempts:  An unsuccessful telephone outreach was attempted today to offer the patient information about available care coordination services as a benefit of their health plan.   Follow Up Plan:  Additional outreach attempts will be made to offer the patient care coordination information and services.   Encounter Outcome:  No Answer  Care Coordination Interventions Activated:  No   Care Coordination Interventions:  No, not indicated    Thea Silversmith, RN, MSN, BSN, [place] Coordinator [phone]

## 2022-04-16 NOTE — Progress Notes (Signed)
[facility] Summary Report / Loop Recorder 

## 2022-04-16 NOTE — Patient Instructions (Signed)
Visit Information  Thank you for taking time to visit with me today. Please don't hesitate to contact me if I can be of assistance to you.   Following are the goals we discussed today:   Goals Addressed             This Visit's Progress    Maintain and/or improve health post hospitalization       Care Coordination Interventions: Advised patient to call provider if any questions or concerns as needed Reviewed instructions provided by pulmonology with patient Upcoming/scheduled appointments reviewed Positive feedback provided regarding self care management        Our next appointment is by telephone on 04/28/22 at 10:30 am  Please call the care guide team at [phone] if you need to cancel or reschedule your appointment.   If you are experiencing a Mental Health or [place] or need someone to talk to, please call the Suicide and Crisis Lifeline: 988  Patient verbalizes understanding of instructions and care plan provided today and agrees to view in [place]. Active MyChart status and patient understanding of how to access instructions and care plan via MyChart confirmed with patient.     Thea Silversmith, RN, MSN, BSN, [place] Coordinator [phone]

## 2022-04-16 NOTE — Patient Outreach (Signed)
  Care Coordination   Follow Up Visit Note   04/16/2022 Name: Vance Belcourt MRN: [id number] DOB: Nov 29, 1950  Mattalyn Anderegg is a 71 y.o. year old female who sees Dorena Dew, FNP for primary care. I spoke with  Rolland Bimler by phone today.  What matters to the patients health and wellness today?  Patient reports she is doing better. She states she is not back to herself 100%, but states she is better than she was when she was in the hospital. She states her portable oxygen concentrator would not work unless it was plugged in, but reports she went to the supplier's office and got it replaced. She denies any questions or concerns at this time.    Goals Addressed             This Visit's Progress    Maintain and/or improve health post hospitalization       Care Coordination Interventions: Advised patient to call provider if any questions or concerns as needed Reviewed instructions provided by pulmonology with patient Upcoming/scheduled appointments reviewed Positive feedback provided regarding self care management      SDOH assessments and interventions completed:  Yes   Care Coordination Interventions Activated:  Yes  Care Coordination Interventions:  Yes, provided   Follow up plan:  04/28/22    Encounter Outcome:  Pt. Visit Completed   Thea Silversmith, RN, MSN, BSN, [place] Coordinator [phone]

## 2022-04-17 ENCOUNTER — Telehealth: Payer: Self-pay

## 2022-04-17 DIAGNOSIS — I5032 Chronic diastolic (congestive) heart failure: Secondary | ICD-10-CM | POA: Diagnosis not present

## 2022-04-17 DIAGNOSIS — J9612 Chronic respiratory failure with hypercapnia: Secondary | ICD-10-CM | POA: Diagnosis not present

## 2022-04-17 DIAGNOSIS — N1832 Chronic kidney disease, stage 3b: Secondary | ICD-10-CM | POA: Diagnosis not present

## 2022-04-17 DIAGNOSIS — I130 Hypertensive heart and chronic kidney disease with heart failure and stage 1 through stage 4 chronic kidney disease, or unspecified chronic kidney disease: Secondary | ICD-10-CM | POA: Diagnosis not present

## 2022-04-17 DIAGNOSIS — J441 Chronic obstructive pulmonary disease with (acute) exacerbation: Secondary | ICD-10-CM | POA: Diagnosis not present

## 2022-04-17 DIAGNOSIS — J9621 Acute and chronic respiratory failure with hypoxia: Secondary | ICD-10-CM | POA: Diagnosis not present

## 2022-04-17 NOTE — Telephone Encounter (Signed)
   Telephone encounter was:  Unsuccessful.  04/17/2022 Name: Pamela [name] MRN: [id number] DOB: [birth year]  Unsuccessful outbound call made today to assist with:  Food Insecurity and Financial Difficulties related to financial strain  Outreach Attempt:  3rd Attempt.  Referral closed unable to contact patient.  A HIPAA compliant voice message was left requesting a return call.  Instructed patient to call back at earliest convenience .    [place], Care Management  [phone] [address], [place], [place] [address] Phone: [phone] Email: [email or web address]

## 2022-04-21 ENCOUNTER — Encounter: Payer: Self-pay | Admitting: Family Medicine

## 2022-04-21 ENCOUNTER — Ambulatory Visit (INDEPENDENT_AMBULATORY_CARE_PROVIDER_SITE_OTHER): Payer: Medicare Other | Admitting: Family Medicine

## 2022-04-21 VITALS — BP 130/66 | HR 67 | Temp 97.1°F | Ht 66.0 in | Wt 340.0 lb

## 2022-04-21 DIAGNOSIS — N183 Chronic kidney disease, stage 3 unspecified: Secondary | ICD-10-CM | POA: Diagnosis not present

## 2022-04-21 DIAGNOSIS — R82998 Other abnormal findings in urine: Secondary | ICD-10-CM | POA: Diagnosis not present

## 2022-04-21 DIAGNOSIS — I639 Cerebral infarction, unspecified: Secondary | ICD-10-CM

## 2022-04-21 DIAGNOSIS — Z23 Encounter for immunization: Secondary | ICD-10-CM

## 2022-04-21 DIAGNOSIS — E875 Hyperkalemia: Secondary | ICD-10-CM

## 2022-04-21 DIAGNOSIS — Z6841 Body Mass Index (BMI) 40.0 and over, adult: Secondary | ICD-10-CM | POA: Diagnosis not present

## 2022-04-21 LAB — POCT URINALYSIS DIPSTICK
Bilirubin, UA: NEGATIVE
Blood, UA: NEGATIVE
Glucose, UA: NEGATIVE
Ketones, UA: NEGATIVE
Nitrite, UA: NEGATIVE
Protein, UA: NEGATIVE
Spec Grav, UA: 1.01 (ref 1.010–1.025)
Urobilinogen, UA: 0.2 E.U./dL
pH, UA: 5 (ref 5.0–8.0)

## 2022-04-21 NOTE — Patient Instructions (Signed)
Rolling walker with seat, will contact physical therapy.

## 2022-04-21 NOTE — Progress Notes (Signed)
Patient [facility]   Established Patient Office Visit  Subjective   Patient ID: Pamela [name], female    DOB: May 15, 1951  Age: 71 y.o. MRN: [id number]  Chief Complaint  Patient presents with   Hospitalization Follow-up    Pt is here for hospital follow up visit. Pt is requesting for flu shot and rollator patient would like to discuss medication what to take and what not to take     Pamela [name] is a 71 year old female with a medical history significant for chronic kidney disease, obesity, prediabetes, history of COPD, chronic respiratory failure with hypoxia on home oxygen, history of moderate persistent asthma, sarcoidosis, chronic diastolic CHF, obstructive sleep apnea, and morbid obesity presents for post hospital follow-up.  Patient was hospitalized from 04/03/2022 - 04/08/2022 for COPD exacerbation.  Patient was treated with IV steroids and transition to oral prior to hospital discharge.  She has completed oral steroids and followed up with pulmonology team at [facility].  Patient has been utilizing home oxygen at 2 L/min.  She is also taking prescribed inhalers consistently.  Patient denies any current shortness of breath, wheezing, or persistent coughing. Patient has a history of hypertension.  She has not been checking blood pressures at home.  During previous hospitalization, patient found to have increased potassium and was advised to hold home medication.  She has not taken this medication since hospital discharge. Patient is also followed by multiple specialists including [facility] nephrology team, pulmonology, podiatry, cardiology, and weight management.    Patient Active Problem List   Diagnosis Date Noted   COPD exacerbation ([place]) 04/04/2022   Vitamin D deficiency 02/02/2022   Stage 3b chronic kidney disease ([place]) 09/24/2020   Malignant neoplasm of lung ([place]) 09/24/2020   Aortic  atherosclerosis ([place]) 09/24/2020   PFO (patent foramen ovale) 09/24/2020   History of stroke 09/24/2020   Acute respiratory failure with hypoxia ([place]) 12/17/2019   COPD with acute exacerbation ([place]) 12/17/2019   COVID-19 virus infection 01/02/2019   Right knee pain 09/15/2018   Closed head injury with concussion 09/06/2018   Hypokalemia 09/06/2018   Hypomagnesemia 09/06/2018   Asthma, chronic obstructive, with acute exacerbation ([place]) 08/02/2018   Plantar fasciitis 04/14/2017   Microcytosis 11/28/2015   Solitary kidney 07/18/2015   Onychomycosis 01/09/2015   Ingrown nail 01/09/2015   Pain in lower limb 01/09/2015   Chronic respiratory failure with hypoxia and hypercapnia (HCC) 07/19/2014   Anemia, iron deficiency 05/27/2014   Acute gouty arthritis 05/25/2014   Obstructive sleep apnea 05/25/2014   Joint pain 05/24/2014   Chronic diastolic CHF (congestive heart failure) ([place])    Sarcoidosis    Metabolic syndrome 49/67/5916   Asthma, chronic 04/27/2014   Anemia of chronic disease 04/19/2014   Morbid obesity ([place]) 04/18/2014   Immunization due 04/18/2014   Need for prophylactic vaccination and inoculation against influenza 04/18/2014   Prediabetes 04/13/2014   Essential hypertension 04/13/2014   Lower extremity edema 04/13/2014   History of lung cancer 04/13/2014   Past Medical History:  Diagnosis Date   Arthritis    Asthma    Cancer ([place])    lung, adenocarcinoma right lung 2012   CHF (congestive heart failure) (HCC)    Preserved EF   Congenital single kidney    With chronic kidney disease   COPD (chronic obstructive pulmonary disease) ([place])    Gout    Hypertension    Lung cancer ([place])  2012   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 Patient notes it was consistent with sarcoidosis but actual  pathology results not available.   Oxygen deficiency    Sarcoidosis    Sickle cell trait ([place])    Sleep apnea    Past Surgical History:  Procedure Laterality Date   LOOP RECORDER INSERTION N/A 09/08/2018   Procedure: LOOP RECORDER INSERTION;  Surgeon: Evans Lance, MD;  Location: [facility];  Service: Cardiovascular;  Laterality: N/A;   LUNG BIOPSY     TEE WITHOUT CARDIOVERSION N/A 09/08/2018   Procedure: TRANSESOPHAGEAL ECHOCARDIOGRAM (TEE);  Surgeon: Josue Hector, MD;  Location: [facility] ENDOSCOPY;  Service: Cardiovascular;  Laterality: N/A;  with loop   TUBAL LIGATION     VEIN LIGATION AND STRIPPING     Social History   Tobacco Use   Smoking status: Never   Smokeless tobacco: Never  Vaping Use   Vaping Use: Never used  Substance Use Topics   Alcohol use: No    Alcohol/week: 0.0 standard drinks of alcohol   Drug use: No   Social History   Socioeconomic History   Marital status: Divorced    Spouse name: Not on file   Number of children: 2   Years of education: Not on file   Highest education level: Not on file  Occupational History   Not on file  Tobacco Use   Smoking status: Never   Smokeless tobacco: Never  Vaping Use   Vaping Use: Never used  Substance and Sexual Activity   Alcohol use: No    Alcohol/week: 0.0 standard drinks of alcohol   Drug use: No   Sexual activity: Not Currently  Other Topics Concern   Not on file  Social History Narrative   Lives with son.  One son is deceased.     Social Determinants of Health   Financial Resource Strain: Not on file  Food Insecurity: Food Insecurity Present (04/09/2022)   Hunger Vital Sign    Worried About Running Out of Food in the Last Year: Sometimes true    Ran Out of Food in the Last Year: Sometimes true  Transportation Needs: No Transportation Needs (04/09/2022)   PRAPARE - Hydrologist (Medical): No    Lack of Transportation (Non-Medical): No  Physical Activity: Not on  file  Stress: Not on file  Social Connections: Not on file  Intimate Partner Violence: Unknown (04/05/2022)   Humiliation, Afraid, Rape, and Kick questionnaire    Fear of Current or Ex-Partner: No    Emotionally Abused: No    Physically Abused: Not on file    Sexually Abused: No   Family Status  Relation Name Status   Mother  Deceased   Father  Deceased   Sister  (Not Specified)   Sister  (Not Specified)   Brother  (Not Specified)   Family History  Problem Relation Age of Onset   Hypertension Mother    Renal Disease Mother    High blood pressure Mother    Heart disease Mother    Cancer Father        stomach   Heart disease Father        No details   Cervical cancer Sister    Diabetes Sister    Multiple  myeloma Sister    Cancer Brother    Diabetes Brother    Allergies  Allergen Reactions   Sulfa Antibiotics Hives and Rash      Review of Systems  Constitutional: Negative.   HENT: Negative.    Respiratory:  Negative for sputum production, shortness of breath and wheezing.   Gastrointestinal: Negative.   Genitourinary: Negative.   Musculoskeletal: Negative.   Skin: Negative.   Neurological: Negative.   Endo/Heme/Allergies: Negative.   Psychiatric/Behavioral: Negative.        Objective:     BP 130/66 (BP Location: Left Arm, Patient Position: Sitting, Cuff Size: Large)   Pulse 67   Temp (!) 97.1 F (36.2 C)   Ht _0  (1.676 m)   Wt (!) 340 lb (154.2 kg)   HC 63" (160 cm)   SpO2 97%   BMI 54.88 kg/m  BP Readings from Last 3 Encounters:  04/21/22 130/66  04/14/22 124/64  04/08/22 (!) 141/70   Wt Readings from Last 3 Encounters:  04/21/22 (!) 340 lb (154.2 kg)  04/14/22 (!) 340 lb (154.2 kg)  04/03/22 (!) 330 lb 6.4 oz (149.9 kg)      Physical Exam Constitutional:      Appearance: Normal appearance.  Eyes:     Pupils: Pupils are equal, round, and reactive to light.  Cardiovascular:     Rate and Rhythm: Normal rate and regular rhythm.      Pulses: Normal pulses.  Abdominal:     General: Bowel sounds are normal.  Musculoskeletal:        General: Normal range of motion.  Skin:    General: Skin is warm.  Neurological:     General: No focal deficit present.     Mental Status: She is alert. Mental status is at baseline.  Psychiatric:        Mood and Affect: Mood normal.        Thought Content: Thought content normal.        Judgment: Judgment normal.      Results for orders placed or performed in visit on 04/21/22  Urinalysis Dipstick  Result Value Ref Range   Color, UA YELLOW    Clarity, UA CLEAR    Glucose, UA Negative Negative   Bilirubin, UA NEGATIVE    Ketones, UA NEGATIVE    Spec Grav, UA 1.010 1.010 - 1.025   Blood, UA NEGATIVE    pH, UA 5.0 5.0 - 8.0   Protein, UA Negative Negative   Urobilinogen, UA 0.2 0.2 or 1.0 E.U./dL   Nitrite, UA NEGATIVE    Leukocytes, UA Trace (A) Negative   Appearance     Odor      Last CBC Lab Results  Component Value Date   WBC 4.4 04/08/2022   HGB 11.9 (L) 04/08/2022   HCT 39.5 04/08/2022   MCV 73.4 (L) 04/08/2022   MCH 22.1 (L) 04/08/2022   RDW 15.6 (H) 04/08/2022   PLT 171 30/03/2329   Last metabolic panel Lab Results  Component Value Date   GLUCOSE 125 (H) 04/08/2022   NA 137 04/08/2022   K 5.3 (H) 04/08/2022   CL 97 (L) 04/08/2022   CO2 29 04/08/2022   BUN 80 (H) 04/08/2022   CREATININE 1.74 (H) 04/08/2022   GFRNONAA 31 (L) 04/08/2022   CALCIUM 8.3 (L) 04/08/2022   PHOS 4.2 12/19/2019   PROT 6.5 04/08/2022   ALBUMIN 3.3 (L) 04/08/2022   LABGLOB 2.9 01/29/2022   AGRATIO 1.4 01/29/2022  BILITOT 0.3 04/08/2022   ALKPHOS 107 04/08/2022   AST 20 04/08/2022   ALT 12 04/08/2022   ANIONGAP 11 04/08/2022   Last lipids Lab Results  Component Value Date   CHOL 166 01/29/2022   HDL 55 01/29/2022   LDLCALC 96 01/29/2022   TRIG 78 01/29/2022   CHOLHDL 2.7 03/01/2019   Last hemoglobin A1c Lab Results  Component Value Date   HGBA1C 6.0 (H)  01/29/2022   Last thyroid functions Lab Results  Component Value Date   TSH 1.870 01/29/2022   Last vitamin D Lab Results  Component Value Date   VD25OH 8.3 (L) 01/29/2022   Last vitamin B12 and Folate Lab Results  Component Value Date   VITAMINB12 830 01/29/2022   FOLATE 13.8 05/25/2014      The ASCVD Risk score (Arnett DK, et al., 2019) failed to calculate for the following reasons:   The patient has a prior MI or stroke diagnosis    Assessment & Plan:   Problem List Items Addressed This Visit   None Visit Diagnoses     Stage 3 chronic kidney disease, unspecified whether stage 3a or 3b CKD ([place])    -  Primary   Relevant Orders   Basic Metabolic Panel   Urinalysis Dipstick (Completed)   Urine Culture   Influenza vaccine needed       Relevant Orders   Flu Vaccine QUAD 36+ mos IM (Fluarix, Fluzone & Afluria Quad PF   Basic Metabolic Panel   Hyperkalemia       Relevant Orders   Basic Metabolic Panel   Urine Culture   Class 3 severe obesity due to excess calories with serious comorbidity and body mass index (BMI) of 50.0 to 59.9 in adult [facility])       Urine leukocytes       Relevant Orders   Urine Culture      1. Influenza vaccine needed  - Flu Vaccine QUAD 36+ mos IM (Fluarix, Fluzone & Afluria Quad PF - Basic Metabolic Panel  2. Hyperkalemia Prior to hospital discharge, potassium was 5.3.  Will recheck potassium level today. - Basic Metabolic Panel   3. Stage 3 chronic kidney disease, unspecified whether stage 3a or 3b CKD (HCC) Serum creatinine was elevated above baseline during hospitalization.  Will recheck creatinine level and GFR today.  Patient will continue to follow-up with nephrology team at [facility]. - Basic Metabolic Panel - Urinalysis Dipstick - Urine Culture  4. Class 3 severe obesity due to excess calories with serious comorbidity and body mass index (BMI) of 50.0 to 59.9 in adult [facility]) Patient has not followed up  with weight management since hospitalization.  Advised to schedule follow-up, first available appointment.  5. Urine leukocytes  - Urine Culture  Return in about 3 months (around 07/22/2022).   Ms. [name] was doing well during her post hospital follow-up.  COPD exacerbation has resolved and patient has returned to her home medications.  Will review laboratory values as results become available.  Patient will follow-up in 3 months as scheduled. Cammie Sickle, FNP

## 2022-04-22 DIAGNOSIS — J9621 Acute and chronic respiratory failure with hypoxia: Secondary | ICD-10-CM | POA: Diagnosis not present

## 2022-04-22 DIAGNOSIS — I5032 Chronic diastolic (congestive) heart failure: Secondary | ICD-10-CM | POA: Diagnosis not present

## 2022-04-22 DIAGNOSIS — J9612 Chronic respiratory failure with hypercapnia: Secondary | ICD-10-CM | POA: Diagnosis not present

## 2022-04-22 DIAGNOSIS — N1832 Chronic kidney disease, stage 3b: Secondary | ICD-10-CM | POA: Diagnosis not present

## 2022-04-22 DIAGNOSIS — I130 Hypertensive heart and chronic kidney disease with heart failure and stage 1 through stage 4 chronic kidney disease, or unspecified chronic kidney disease: Secondary | ICD-10-CM | POA: Diagnosis not present

## 2022-04-22 DIAGNOSIS — J441 Chronic obstructive pulmonary disease with (acute) exacerbation: Secondary | ICD-10-CM | POA: Diagnosis not present

## 2022-04-22 LAB — BASIC METABOLIC PANEL
BUN/Creatinine Ratio: 22 (ref 12–28)
BUN: 30 mg/dL — ABNORMAL HIGH (ref 8–27)
CO2: 23 mmol/L (ref 20–29)
Calcium: 8 mg/dL — ABNORMAL LOW (ref 8.7–10.3)
Chloride: 106 mmol/L (ref 96–106)
Creatinine, Ser: 1.39 mg/dL — ABNORMAL HIGH (ref 0.57–1.00)
Glucose: 84 mg/dL (ref 70–99)
Potassium: 4.1 mmol/L (ref 3.5–5.2)
Sodium: 145 mmol/L — ABNORMAL HIGH (ref 134–144)
eGFR: 41 mL/min/{1.73_m2} — ABNORMAL LOW (ref >59)

## 2022-04-23 LAB — URINE CULTURE

## 2022-04-24 DIAGNOSIS — J9612 Chronic respiratory failure with hypercapnia: Secondary | ICD-10-CM | POA: Diagnosis not present

## 2022-04-24 DIAGNOSIS — I5032 Chronic diastolic (congestive) heart failure: Secondary | ICD-10-CM | POA: Diagnosis not present

## 2022-04-24 DIAGNOSIS — I130 Hypertensive heart and chronic kidney disease with heart failure and stage 1 through stage 4 chronic kidney disease, or unspecified chronic kidney disease: Secondary | ICD-10-CM | POA: Diagnosis not present

## 2022-04-24 DIAGNOSIS — J9621 Acute and chronic respiratory failure with hypoxia: Secondary | ICD-10-CM | POA: Diagnosis not present

## 2022-04-24 DIAGNOSIS — N1832 Chronic kidney disease, stage 3b: Secondary | ICD-10-CM | POA: Diagnosis not present

## 2022-04-24 DIAGNOSIS — J441 Chronic obstructive pulmonary disease with (acute) exacerbation: Secondary | ICD-10-CM | POA: Diagnosis not present

## 2022-04-27 DIAGNOSIS — R7309 Other abnormal glucose: Secondary | ICD-10-CM | POA: Diagnosis not present

## 2022-04-27 DIAGNOSIS — H35033 Hypertensive retinopathy, bilateral: Secondary | ICD-10-CM | POA: Diagnosis not present

## 2022-04-27 DIAGNOSIS — H02821 Cysts of right upper eyelid: Secondary | ICD-10-CM | POA: Diagnosis not present

## 2022-04-27 DIAGNOSIS — H25813 Combined forms of age-related cataract, bilateral: Secondary | ICD-10-CM | POA: Diagnosis not present

## 2022-04-28 ENCOUNTER — Ambulatory Visit: Payer: Self-pay

## 2022-04-28 ENCOUNTER — Telehealth: Payer: Self-pay

## 2022-04-28 DIAGNOSIS — I5032 Chronic diastolic (congestive) heart failure: Secondary | ICD-10-CM | POA: Diagnosis not present

## 2022-04-28 DIAGNOSIS — J9621 Acute and chronic respiratory failure with hypoxia: Secondary | ICD-10-CM | POA: Diagnosis not present

## 2022-04-28 DIAGNOSIS — N1832 Chronic kidney disease, stage 3b: Secondary | ICD-10-CM | POA: Diagnosis not present

## 2022-04-28 DIAGNOSIS — I130 Hypertensive heart and chronic kidney disease with heart failure and stage 1 through stage 4 chronic kidney disease, or unspecified chronic kidney disease: Secondary | ICD-10-CM | POA: Diagnosis not present

## 2022-04-28 DIAGNOSIS — J441 Chronic obstructive pulmonary disease with (acute) exacerbation: Secondary | ICD-10-CM | POA: Diagnosis not present

## 2022-04-28 DIAGNOSIS — J9612 Chronic respiratory failure with hypercapnia: Secondary | ICD-10-CM | POA: Diagnosis not present

## 2022-04-28 NOTE — Patient Outreach (Signed)
  Care Coordination   Follow Up Visit Note   04/28/2022 Name: Pamela [name] MRN: [id number] DOB: [birth year]  Pamela [name] is a 71 y.o. year old female who sees Dorena Dew, FNP for primary care. I spoke with  Rolland Bimler by phone today.  What matters to the patients health and wellness today?  Was seen by primary care provider on 04/21/22. Per review of chart: patient is doing well; COPD exacerbation has resolved. She has an upcoming appointment with pulmonologist on 05/22/22. She reports she continues to have home health physical therapy. And reports she will follow up with weight management clinic when she is released from home health services. Patient denies any signs/symptoms of COPD exacerbation at this time. [name] denies any care coordination, resource or disease management needs at this time and is in agreement for care coordinator to close out of case.   Goals Addressed             This Visit's Progress    COMPLETED: Maintain and/or improve health post hospitalization       Care Coordination Interventions: Reviewed COPD action plan. Patient verbalized understanding Advised patient to call care coordinator if care coordination needs in the future.       SDOH assessments and interventions completed:  No  Care Coordination Interventions Activated:  Yes  Care Coordination Interventions:  Yes, provided   Follow up plan: No further intervention required.   Encounter Outcome:  Pt. Visit Completed   Thea Silversmith, RN, MSN, BSN, [place] Coordinator [phone]

## 2022-04-28 NOTE — Patient Instructions (Signed)
Visit Information  Thank you for taking time to visit with me today. Please don't hesitate to contact me if I can be of assistance to you.   Following are the goals we discussed today:   Goals Addressed             This Visit's Progress    COMPLETED: Maintain and/or improve health post hospitalization       Care Coordination Interventions: Reviewed COPD action plan. Patient verbalized understanding Advised patient to call care coordinator if care coordination needs in the future.       No follow up required.  If you are experiencing a Mental Health or [place] or need someone to talk to, please call the Suicide and Crisis Lifeline: 988  The patient verbalized understanding of instructions, educational materials, and care plan provided today and DECLINED offer to receive copy of patient instructions, educational materials, and care plan.   Thea Silversmith, RN, MSN, BSN, [place] Coordinator [phone]

## 2022-04-28 NOTE — Telephone Encounter (Signed)
Richardson Landry from [facility] PT has asked that we send a RX for Rolator to [facility] for the patient. He is requesting that it is 4 wheel with seat and rack for oxygen. Please advise.

## 2022-04-29 ENCOUNTER — Other Ambulatory Visit: Payer: Self-pay | Admitting: Family Medicine

## 2022-04-29 DIAGNOSIS — R600 Localized edema: Secondary | ICD-10-CM

## 2022-04-29 DIAGNOSIS — R6889 Other general symptoms and signs: Secondary | ICD-10-CM

## 2022-04-29 DIAGNOSIS — J9611 Chronic respiratory failure with hypoxia: Secondary | ICD-10-CM

## 2022-04-29 NOTE — Telephone Encounter (Signed)
Rx sent to [facility]

## 2022-04-29 NOTE — Progress Notes (Unsigned)
pr

## 2022-04-30 DIAGNOSIS — I130 Hypertensive heart and chronic kidney disease with heart failure and stage 1 through stage 4 chronic kidney disease, or unspecified chronic kidney disease: Secondary | ICD-10-CM | POA: Diagnosis not present

## 2022-04-30 DIAGNOSIS — J9621 Acute and chronic respiratory failure with hypoxia: Secondary | ICD-10-CM | POA: Diagnosis not present

## 2022-04-30 DIAGNOSIS — J9612 Chronic respiratory failure with hypercapnia: Secondary | ICD-10-CM | POA: Diagnosis not present

## 2022-04-30 DIAGNOSIS — I5032 Chronic diastolic (congestive) heart failure: Secondary | ICD-10-CM | POA: Diagnosis not present

## 2022-04-30 DIAGNOSIS — N1832 Chronic kidney disease, stage 3b: Secondary | ICD-10-CM | POA: Diagnosis not present

## 2022-04-30 DIAGNOSIS — J441 Chronic obstructive pulmonary disease with (acute) exacerbation: Secondary | ICD-10-CM | POA: Diagnosis not present

## 2022-05-04 ENCOUNTER — Ambulatory Visit (INDEPENDENT_AMBULATORY_CARE_PROVIDER_SITE_OTHER): Payer: Medicare Other

## 2022-05-04 DIAGNOSIS — J9621 Acute and chronic respiratory failure with hypoxia: Secondary | ICD-10-CM | POA: Diagnosis not present

## 2022-05-04 DIAGNOSIS — I639 Cerebral infarction, unspecified: Secondary | ICD-10-CM

## 2022-05-04 DIAGNOSIS — J9612 Chronic respiratory failure with hypercapnia: Secondary | ICD-10-CM | POA: Diagnosis not present

## 2022-05-04 DIAGNOSIS — N1832 Chronic kidney disease, stage 3b: Secondary | ICD-10-CM | POA: Diagnosis not present

## 2022-05-04 DIAGNOSIS — J441 Chronic obstructive pulmonary disease with (acute) exacerbation: Secondary | ICD-10-CM | POA: Diagnosis not present

## 2022-05-04 DIAGNOSIS — I5032 Chronic diastolic (congestive) heart failure: Secondary | ICD-10-CM | POA: Diagnosis not present

## 2022-05-04 DIAGNOSIS — I130 Hypertensive heart and chronic kidney disease with heart failure and stage 1 through stage 4 chronic kidney disease, or unspecified chronic kidney disease: Secondary | ICD-10-CM | POA: Diagnosis not present

## 2022-05-04 LAB — CUP PACEART REMOTE DEVICE CHECK
Date Time Interrogation Session: 20231009232253
Implantable Pulse Generator Implant Date: 20200220

## 2022-05-05 ENCOUNTER — Ambulatory Visit: Payer: Medicare Other | Admitting: Family Medicine

## 2022-05-05 ENCOUNTER — Telehealth: Payer: Self-pay

## 2022-05-05 NOTE — Telephone Encounter (Signed)
Seth Bake The social worker from [facility] called asking if SW eval could be moved to this week, Verbal given that this week is ok.

## 2022-05-07 DIAGNOSIS — N1832 Chronic kidney disease, stage 3b: Secondary | ICD-10-CM | POA: Diagnosis not present

## 2022-05-07 DIAGNOSIS — I130 Hypertensive heart and chronic kidney disease with heart failure and stage 1 through stage 4 chronic kidney disease, or unspecified chronic kidney disease: Secondary | ICD-10-CM | POA: Diagnosis not present

## 2022-05-07 DIAGNOSIS — J441 Chronic obstructive pulmonary disease with (acute) exacerbation: Secondary | ICD-10-CM | POA: Diagnosis not present

## 2022-05-07 DIAGNOSIS — J9621 Acute and chronic respiratory failure with hypoxia: Secondary | ICD-10-CM | POA: Diagnosis not present

## 2022-05-07 DIAGNOSIS — J9612 Chronic respiratory failure with hypercapnia: Secondary | ICD-10-CM | POA: Diagnosis not present

## 2022-05-07 DIAGNOSIS — I5032 Chronic diastolic (congestive) heart failure: Secondary | ICD-10-CM | POA: Diagnosis not present

## 2022-05-10 DIAGNOSIS — Z6841 Body Mass Index (BMI) 40.0 and over, adult: Secondary | ICD-10-CM | POA: Diagnosis not present

## 2022-05-10 DIAGNOSIS — Z7982 Long term (current) use of aspirin: Secondary | ICD-10-CM | POA: Diagnosis not present

## 2022-05-10 DIAGNOSIS — I700 Atherosclerosis of aorta: Secondary | ICD-10-CM | POA: Diagnosis not present

## 2022-05-10 DIAGNOSIS — Z9181 History of falling: Secondary | ICD-10-CM | POA: Diagnosis not present

## 2022-05-10 DIAGNOSIS — M1030 Gout due to renal impairment, unspecified site: Secondary | ICD-10-CM | POA: Diagnosis not present

## 2022-05-10 DIAGNOSIS — Z7722 Contact with and (suspected) exposure to environmental tobacco smoke (acute) (chronic): Secondary | ICD-10-CM | POA: Diagnosis not present

## 2022-05-10 DIAGNOSIS — Z9981 Dependence on supplemental oxygen: Secondary | ICD-10-CM | POA: Diagnosis not present

## 2022-05-10 DIAGNOSIS — D509 Iron deficiency anemia, unspecified: Secondary | ICD-10-CM | POA: Diagnosis not present

## 2022-05-10 DIAGNOSIS — J441 Chronic obstructive pulmonary disease with (acute) exacerbation: Secondary | ICD-10-CM | POA: Diagnosis not present

## 2022-05-10 DIAGNOSIS — Z8616 Personal history of COVID-19: Secondary | ICD-10-CM | POA: Diagnosis not present

## 2022-05-10 DIAGNOSIS — Z85118 Personal history of other malignant neoplasm of bronchus and lung: Secondary | ICD-10-CM | POA: Diagnosis not present

## 2022-05-10 DIAGNOSIS — D573 Sickle-cell trait: Secondary | ICD-10-CM | POA: Diagnosis not present

## 2022-05-10 DIAGNOSIS — N179 Acute kidney failure, unspecified: Secondary | ICD-10-CM | POA: Diagnosis not present

## 2022-05-10 DIAGNOSIS — I130 Hypertensive heart and chronic kidney disease with heart failure and stage 1 through stage 4 chronic kidney disease, or unspecified chronic kidney disease: Secondary | ICD-10-CM | POA: Diagnosis not present

## 2022-05-10 DIAGNOSIS — I5032 Chronic diastolic (congestive) heart failure: Secondary | ICD-10-CM | POA: Diagnosis not present

## 2022-05-10 DIAGNOSIS — J9612 Chronic respiratory failure with hypercapnia: Secondary | ICD-10-CM | POA: Diagnosis not present

## 2022-05-10 DIAGNOSIS — R7303 Prediabetes: Secondary | ICD-10-CM | POA: Diagnosis not present

## 2022-05-10 DIAGNOSIS — N1832 Chronic kidney disease, stage 3b: Secondary | ICD-10-CM | POA: Diagnosis not present

## 2022-05-10 DIAGNOSIS — J9621 Acute and chronic respiratory failure with hypoxia: Secondary | ICD-10-CM | POA: Diagnosis not present

## 2022-05-10 DIAGNOSIS — E875 Hyperkalemia: Secondary | ICD-10-CM | POA: Diagnosis not present

## 2022-05-11 DIAGNOSIS — J9612 Chronic respiratory failure with hypercapnia: Secondary | ICD-10-CM | POA: Diagnosis not present

## 2022-05-11 DIAGNOSIS — J9621 Acute and chronic respiratory failure with hypoxia: Secondary | ICD-10-CM | POA: Diagnosis not present

## 2022-05-11 DIAGNOSIS — I5032 Chronic diastolic (congestive) heart failure: Secondary | ICD-10-CM | POA: Diagnosis not present

## 2022-05-11 DIAGNOSIS — N1832 Chronic kidney disease, stage 3b: Secondary | ICD-10-CM | POA: Diagnosis not present

## 2022-05-11 DIAGNOSIS — J441 Chronic obstructive pulmonary disease with (acute) exacerbation: Secondary | ICD-10-CM | POA: Diagnosis not present

## 2022-05-11 DIAGNOSIS — I130 Hypertensive heart and chronic kidney disease with heart failure and stage 1 through stage 4 chronic kidney disease, or unspecified chronic kidney disease: Secondary | ICD-10-CM | POA: Diagnosis not present

## 2022-05-20 DIAGNOSIS — N1832 Chronic kidney disease, stage 3b: Secondary | ICD-10-CM | POA: Diagnosis not present

## 2022-05-20 DIAGNOSIS — J9612 Chronic respiratory failure with hypercapnia: Secondary | ICD-10-CM | POA: Diagnosis not present

## 2022-05-20 DIAGNOSIS — J9621 Acute and chronic respiratory failure with hypoxia: Secondary | ICD-10-CM | POA: Diagnosis not present

## 2022-05-20 DIAGNOSIS — J441 Chronic obstructive pulmonary disease with (acute) exacerbation: Secondary | ICD-10-CM | POA: Diagnosis not present

## 2022-05-20 DIAGNOSIS — I130 Hypertensive heart and chronic kidney disease with heart failure and stage 1 through stage 4 chronic kidney disease, or unspecified chronic kidney disease: Secondary | ICD-10-CM | POA: Diagnosis not present

## 2022-05-20 DIAGNOSIS — I5032 Chronic diastolic (congestive) heart failure: Secondary | ICD-10-CM | POA: Diagnosis not present

## 2022-05-21 ENCOUNTER — Other Ambulatory Visit: Payer: Self-pay | Admitting: *Deleted

## 2022-05-21 DIAGNOSIS — J441 Chronic obstructive pulmonary disease with (acute) exacerbation: Secondary | ICD-10-CM

## 2022-05-22 ENCOUNTER — Encounter: Payer: Self-pay | Admitting: Adult Health

## 2022-05-22 ENCOUNTER — Ambulatory Visit (INDEPENDENT_AMBULATORY_CARE_PROVIDER_SITE_OTHER): Payer: Medicare Other | Admitting: Adult Health

## 2022-05-22 ENCOUNTER — Ambulatory Visit (INDEPENDENT_AMBULATORY_CARE_PROVIDER_SITE_OTHER): Payer: Medicare Other | Admitting: Internal Medicine

## 2022-05-22 VITALS — BP 112/70 | HR 75 | Temp 97.8°F | Ht 66.0 in | Wt 342.0 lb

## 2022-05-22 DIAGNOSIS — J45901 Unspecified asthma with (acute) exacerbation: Secondary | ICD-10-CM | POA: Diagnosis not present

## 2022-05-22 DIAGNOSIS — J441 Chronic obstructive pulmonary disease with (acute) exacerbation: Secondary | ICD-10-CM | POA: Diagnosis not present

## 2022-05-22 DIAGNOSIS — J9612 Chronic respiratory failure with hypercapnia: Secondary | ICD-10-CM | POA: Diagnosis not present

## 2022-05-22 DIAGNOSIS — I639 Cerebral infarction, unspecified: Secondary | ICD-10-CM

## 2022-05-22 DIAGNOSIS — J4540 Moderate persistent asthma, uncomplicated: Secondary | ICD-10-CM | POA: Diagnosis not present

## 2022-05-22 DIAGNOSIS — J9611 Chronic respiratory failure with hypoxia: Secondary | ICD-10-CM

## 2022-05-22 DIAGNOSIS — I5032 Chronic diastolic (congestive) heart failure: Secondary | ICD-10-CM

## 2022-05-22 LAB — PULMONARY FUNCTION TEST
DL/VA % pred: 111 %
DL/VA: 4.55 ml/min/mmHg/L
DLCO cor % pred: 69 %
DLCO cor: 14.6 ml/min/mmHg
DLCO unc % pred: 69 %
DLCO unc: 14.6 ml/min/mmHg
FEF 25-75 Post: 1.59 L/sec
FEF 25-75 Pre: 1.25 L/sec
FEF2575-%Change-Post: 27 %
FEF2575-%Pred-Post: 77 %
FEF2575-%Pred-Pre: 60 %
FEV1-%Change-Post: 8 %
FEV1-%Pred-Post: 51 %
FEV1-%Pred-Pre: 47 %
FEV1-Post: 1.29 L
FEV1-Pre: 1.18 L
FEV1FVC-%Change-Post: 1 %
FEV1FVC-%Pred-Pre: 110 %
FEV6-%Change-Post: 7 %
FEV6-%Pred-Post: 48 %
FEV6-%Pred-Pre: 44 %
FEV6-Post: 1.51 L
FEV6-Pre: 1.41 L
FEV6FVC-%Pred-Post: 104 %
FEV6FVC-%Pred-Pre: 104 %
FVC-%Change-Post: 7 %
FVC-%Pred-Post: 46 %
FVC-%Pred-Pre: 42 %
FVC-Post: 1.51 L
FVC-Pre: 1.41 L
Post FEV1/FVC ratio: 85 %
Post FEV6/FVC ratio: 100 %
Pre FEV1/FVC ratio: 84 %
Pre FEV6/FVC Ratio: 100 %
RV % pred: 66 %
RV: 1.51 L
TLC % pred: 67 %
TLC: 3.59 L

## 2022-05-22 NOTE — Progress Notes (Signed)
Full PFT performed today. °

## 2022-05-22 NOTE — Progress Notes (Signed)
@Patient  ID: Pamela [name], female    DOB: 03-19-1951, 71 y.o.   MRN: [id number]  Chief Complaint  Patient presents with   Follow-up    Referring provider: Dorena Dew, FNP  HPI: 71 year old female never smoker followed for asthma with an allergic phenotype, sarcoidosis, history of lung cancer diagnosed in 2012 status post SBRT (second hand smoke exposure at home)  Previously on chronic oxygen from 2013-2018 and restarted September 2023 Medical history significant for right anterior chest wall mass/right pectoralis minor muscle mass (biopsy done in 2015 nondiagnostic, PET scan October 2016 shows no change in metabolic activity or size suggestive of benign etiology, chronic kidney disease, congestive heart failure, PFO on Echo -bubble study , Prior CVA   TEST/EVENTS :  CT chest Nov 20, 2021 similar posttreatment fibrosis/consolidation with volume loss in the right upper lobe.  No evidence of recurrence, tiny right upper lobe pulmonary nodule no suspicious pulmonary nodules.  A 5.5 x 2.1 cm soft tissue mass in the right pectoralis major and minor musculature.    pfts 07/31/13  FEV1  1.53 (58%) with ratio 66 > p saba ratio 74 FEV1 1.68 (64%)    FENO 06/01/2016  =   85    Spirometry 02/22/2018  FEV1 1.23 (57%)  Ratio 71 with min curvature  - FENO 02/22/2018  = 61   Split night sleep study in 07/2014 showed mild OSA only with AHI 6.9. O2 sats nml on 2l/m .    She also carries dx of Sarcoid from previous bx. (followed previously by Dr. Bartholome Bill at [facility], with lymphadenopathy in chest , abd w/ possible spleen and liver involvement.   03/2022 CT chest neg for PE, stable RUL scarring likely radiation changes.   05/22/2022 Follow up : Asthma and O2 RF  Patient returns for a 6-week follow-up.  Patient was seen last visit after a recent hospitalization for severe asthma exacerbation with associated hypoxemia.  Last visit patient was not taking Advair .  She was recommended to begin  Trelegy inhaler, Singulair and Zyrtec daily.  She has been discharged on oxygen 2 L.  Since last visit patient is feeling better with decreased cough and congestion.  Feels that the new inhaler and allergy medicines are making a difference.  She is trying to be more active at home.  She is using her rolling walker.  She does feel the oxygen is helping.  She says she is able to take her oxygen off when sitting still.  But needs it when she is moving around.  For the most part oxygen levels stay above 90% at rest. PFTs today show severe restriction with FEV1 at 51%, ratio 85, FVC 46%, no significant bronchodilator response, DLCO 69%.  We reviewed her T results in detail.  She denies any increased albuterol use.  Patient was diagnosed with sleep apnea in 2016 with mild sleep apnea on split-night sleep study.  Patient says that she sleeps well.  Does not think that she snores much.  We discussed a repeat sleep study with her BMI at 55 along with hypoxemia.  She declines at this time.  Patient education was given.  Patient has diastolic heart failure.  Says she is remaining on Lasix 40 mg daily.  She follows with cardiology and nephrology.  She denies any increased leg swelling but has chronic daily swelling.  She denies any orthopnea.   Allergies  Allergen Reactions   Sulfa Antibiotics Hives and Rash    Immunization History  Administered Date(s) Administered   Influenza,inj,Quad PF,6+ Mos 04/13/2014, 04/16/2015, 04/03/2016, 05/19/2017, 06/15/2018, 05/14/2019, 04/21/2022   PFIZER(Purple Top)SARS-COV-2 Vaccination 10/14/2019, 11/08/2019, 07/08/2020, 03/06/2021   Pneumococcal Conjugate-13 04/03/2016   Pneumococcal Polysaccharide-23 04/13/2014, 12/26/2019   Tdap 01/02/2015   Zoster, Live 05/21/2014    Past Medical History:  Diagnosis Date   Arthritis    Asthma    Cancer ([place])    lung, adenocarcinoma right lung 2012   CHF (congestive heart failure) ([facility])    Preserved EF   Congenital single  kidney    With chronic kidney disease   COPD (chronic obstructive pulmonary disease) ([place])    Gout    Hypertension    Lung cancer ([place]) 2012   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 Patient notes it was consistent with sarcoidosis but actual pathology results not available.   Oxygen deficiency    Sarcoidosis    Sickle cell trait ([place])    Sleep apnea     Tobacco History: Social History   Tobacco Use  Smoking Status Never  Smokeless Tobacco Never   Counseling given: Not Answered   Outpatient Medications Prior to Visit  Medication Sig Dispense Refill   acetaminophen (TYLENOL) 500 MG tablet Take 1,000 mg by mouth every 6 (six) hours as needed for mild pain.     albuterol (PROAIR HFA) 108 (90 Base) MCG/ACT inhaler Inhale 2 puffs into the lungs every 4 (four) hours as needed for wheezing or shortness of breath. 8 g 3   albuterol (PROVENTIL) (2.5 MG/3ML) 0.083% nebulizer solution USE 1 VIAL IN NEBULIZER EVERY 6 HOURS AS NEEDED FOR WHEEZING FOR SHORTNESS OF BREATH 300 mL 2   aspirin EC 81 MG tablet Take 1 tablet (81 mg total) by mouth daily. Swallow whole. 30 tablet 11   calcitRIOL (ROCALTROL) 0.25 MCG capsule Take 1 capsule (0.25 mcg total) by mouth daily. 90 capsule 0   docusate sodium (STOOL SOFTENER) 100 MG capsule Take 100 mg by mouth daily.     febuxostat (ULORIC) 40 MG tablet Take 1 tablet (40 mg total) by mouth daily. 90 tablet 1   Ferrous Sulfate (IRON PO) Take 1 tablet by mouth daily.     Fluticasone-Umeclidin-Vilant (TRELEGY ELLIPTA) 200-62.5-25 MCG/ACT AEPB Inhale 1 puff into the lungs daily. 1 each 5   furosemide (LASIX) 40 MG tablet Take 1 tablet (40 mg total) by mouth daily.     montelukast (SINGULAIR) 10 MG tablet Take 1 tablet (10 mg total) by mouth at bedtime. 30 tablet 11   Vitamin D,  Ergocalciferol, (DRISDOL) 1.25 MG (50000 UNIT) CAPS capsule Take 1 capsule (50,000 Units total) by mouth every 7 (seven) days. 5 capsule 0   predniSONE (DELTASONE) 10 MG tablet Take 4 tabs (40 mg total) daily x2 days, then 3 tabs (30 mg total) daily x2 days, then 2 tabs (20 mg total) daily x2 days, then 1 tab (10 mg total) daily x2 days, then stop. (Patient not taking: Reported on 04/21/2022) 20 tablet 0   No facility-administered medications prior to visit.     Review of Systems:   Constitutional:   No  weight loss, night sweats,  Fevers, chills,  +fatigue, or  lassitude.  HEENT:   No headaches,  Difficulty swallowing,  Tooth/dental problems, or  Sore throat,  No sneezing, itching, ear ache, nasal congestion, post nasal drip,   CV:  No chest pain,  Orthopnea, PND, swelling in lower extremities, anasarca, dizziness, palpitations, syncope.   GI  No heartburn, indigestion, abdominal pain, nausea, vomiting, diarrhea, change in bowel habits, loss of appetite, bloody stools.   Resp:  No chest wall deformity  Skin: no rash or lesions.  GU: no dysuria, change in color of urine, no urgency or frequency.  No flank pain, no hematuria   MS:  No joint pain or swelling.  No decreased range of motion.  No back pain.    Physical Exam  BP 112/70 (BP Location: Left Arm, Patient Position: Sitting, Cuff Size: Large)   Pulse 75   Temp 97.8 F (36.6 C) (Oral)   Ht 5\' 6"  (1.676 m)   Wt (!) 342 lb (155.1 kg)   SpO2 97% Comment: 2L [place]  BMI 55.20 kg/m   GEN: A/Ox3; pleasant , NAD, morbidly obese, rolling walker, oxygen   HEENT:  [place]/AT,   NOSE-clear, THROAT-clear, no lesions, no postnasal drip or exudate noted.   NECK:  Supple w/ fair ROM; no JVD; normal carotid impulses w/o bruits; no thyromegaly or nodules palpated; no lymphadenopathy.    RESP  Clear  P & A; w/o, wheezes/ rales/ or rhonchi. no accessory muscle use, no dullness to percussion  CARD:  RRR, no m/r/g, 2+ peripheral  edema, pulses intact, no cyanosis or clubbing.  Venous see with stasis dermatitis  GI:   Soft & nt; nml bowel sounds; no organomegaly or masses detected.   Musco: Warm bil, no deformities or joint swelling noted.   Neuro: alert, no focal deficits noted.    Skin: Warm, no lesions or rashes    Lab Results:  CBC   BMET   BNP   Imaging: CUP PACEART REMOTE DEVICE CHECK  Result Date: 05/04/2022 ILR summary report received. Battery status OK. Normal device function. No new symptom, tachy, brady, or pause episodes. No new AF episodes. Monthly summary reports and ROV/PRN No histogram data, acceptable HR graph LA        Latest Ref Rng & Units 05/22/2022    8:13 AM 08/30/2018   10:03 AM 07/19/2014   11:01 AM  PFT Results  FVC-Pre L 1.41  P 1.92  2.32   FVC-Predicted Pre % 42  P 69  85   FVC-Post L 1.51  P 2.13  2.36   FVC-Predicted Post % 46  P 77  86   Pre FEV1/FVC % % 84  P 83  71   Post FEV1/FCV % % 85  P 79  85   FEV1-Pre L 1.18  P 1.59  1.64   FEV1-Predicted Pre % 47  P 74  76   FEV1-Post L 1.29  P 1.69  2.00   DLCO uncorrected ml/min/mmHg 14.60  P 23.90  18.43   DLCO UNC% % 69  P 112  71   DLCO corrected ml/min/mmHg 14.60  P    DLCO COR %Predicted % 69  P    DLVA Predicted % 111  P 154  101   TLC L 3.59  P    TLC % Predicted % 67  P    RV % Predicted % 66  P      P Preliminary result    Lab Results  Component Value Date   NITRICOXIDE 56 08/30/2018        Assessment & Plan:   No problem-specific Assessment & Plan  notes found for this encounter.     Rexene Edison, NP 05/22/2022

## 2022-05-22 NOTE — Assessment & Plan Note (Signed)
Appears to be euvolemic on exam without evidence of overt volume overload.  Continue on current regimen low-salt diet and follow-up with cardiology and nephrology.

## 2022-05-22 NOTE — Assessment & Plan Note (Signed)
Recent exacerbation now resolved and back to baseline.  Patient is continue on maintenance inhaler.  Long discussion regarding continued usage of your inhaler and trigger prevention with Singulair and Zyrtec.  Plan  Patient Instructions  Continue on Trelegy 1 puff daily  ,rinse after use.  Singulair 10mg  At bedtime  Zyrtec 10mg  daily  Albuterol inhaler or neb As needed   Continue on Oxygen 2l/m .  Call back if you change your mind on sleep study .  Continue on Lasix  Continue with follow up with Cardiology and Nephrology  Follow up with in 3 months with Pamela [name]  and As needed   Please contact office for sooner follow up if symptoms do not improve or worsen or seek emergency care   '

## 2022-05-22 NOTE — Patient Instructions (Addendum)
Continue on Trelegy 1 puff daily  ,rinse after use.  Singulair 10mg  At bedtime  Zyrtec 10mg  daily  Albuterol inhaler or neb As needed   Continue on Oxygen 2l/m .  Call back if you change your mind on sleep study .  Continue on Lasix  Continue with follow up with Cardiology and Nephrology  Follow up with in 3 months with Pamela [name]  and As needed   Please contact office for sooner follow up if symptoms do not improve or worsen or seek emergency care

## 2022-05-22 NOTE — Assessment & Plan Note (Addendum)
Continue on oxygen to maintain O2 saturations greater than 88 to 90%.  Did discuss a repeat sleep study as she has chronic hypercarbia might benefit from BiPAP at bedtime.  Patient declines repeat sleep study.

## 2022-05-22 NOTE — Patient Instructions (Signed)
Full PFT performed today. °

## 2022-05-26 NOTE — Progress Notes (Signed)
[facility] Summary Report / Loop Recorder 

## 2022-06-08 ENCOUNTER — Ambulatory Visit (INDEPENDENT_AMBULATORY_CARE_PROVIDER_SITE_OTHER): Payer: Medicare Other

## 2022-06-08 DIAGNOSIS — I639 Cerebral infarction, unspecified: Secondary | ICD-10-CM | POA: Diagnosis not present

## 2022-06-09 LAB — CUP PACEART REMOTE DEVICE CHECK
Date Time Interrogation Session: 20231119231252
Implantable Pulse Generator Implant Date: 20200220

## 2022-06-19 ENCOUNTER — Telehealth: Payer: Self-pay | Admitting: Family Medicine

## 2022-06-19 ENCOUNTER — Other Ambulatory Visit: Payer: Self-pay

## 2022-06-19 DIAGNOSIS — J4530 Mild persistent asthma, uncomplicated: Secondary | ICD-10-CM

## 2022-06-19 MED ORDER — CALCITRIOL 0.25 MCG PO CAPS: 0.2500 ug | ORAL_CAPSULE | Freq: Every day | ORAL | 0 refills | Status: DC

## 2022-06-19 MED ORDER — ALBUTEROL SULFATE HFA 108 (90 BASE) MCG/ACT IN AERS: 2.0000 | INHALATION_SPRAY | RESPIRATORY_TRACT | 1 refills | Status: DC | PRN

## 2022-06-19 NOTE — Telephone Encounter (Signed)
Done kh

## 2022-06-19 NOTE — Telephone Encounter (Signed)
Caller & Relationship to patient:  MRN #  [id number]   Call Back Number:   Date of Last Office Visit: 05/05/2022     Date of Next Office Visit: 07/28/2022    Medication(s) to be Refilled: Calcium carbonate. Albuterol pump   Preferred Pharmacy:   ** Please notify patient to allow 48-72 hours to process** **Let patient know to contact pharmacy at the end of the day to make sure medication is ready. ** **If patient has not been seen in a year or longer, book an appointment **Advise to use MyChart for refill requests OR to contact their pharmacy

## 2022-06-22 ENCOUNTER — Other Ambulatory Visit: Payer: Self-pay | Admitting: Family Medicine

## 2022-06-22 DIAGNOSIS — Z1231 Encounter for screening mammogram for malignant neoplasm of breast: Secondary | ICD-10-CM

## 2022-06-24 ENCOUNTER — Ambulatory Visit: Payer: Medicare Other

## 2022-06-26 ENCOUNTER — Telehealth: Payer: Self-pay | Admitting: Family Medicine

## 2022-06-26 ENCOUNTER — Other Ambulatory Visit: Payer: Self-pay | Admitting: Family Medicine

## 2022-06-26 MED ORDER — CALCITRIOL 0.25 MCG PO CAPS: 0.2500 ug | ORAL_CAPSULE | Freq: Every day | ORAL | 2 refills | Status: DC

## 2022-06-26 NOTE — Telephone Encounter (Signed)
Calcitrol refill request

## 2022-06-26 NOTE — Progress Notes (Signed)
Meds ordered this encounter  Medications   calcitRIOL (ROCALTROL) 0.25 MCG capsule    Sig: Take 1 capsule (0.25 mcg total) by mouth daily.    Dispense:  90 capsule    Refill:  2    Order Specific Question:   Supervising Provider    Answer:   Tresa Garter [id number]

## 2022-07-06 ENCOUNTER — Telehealth: Payer: Self-pay

## 2022-07-06 NOTE — Telephone Encounter (Signed)
ILR @ RRT   ILR reached RRT: 06/29/2022 Patient called, discussed options to leave device in or explanted.  Patient is not sure at this time if she would like to leave in or explant but will call back if she wants explanted.  Marked "I" in Paceart: Yes Enter note in Paceart:Yes Canceled future remotes:No apts. Were in EPIC. Discontinued from website:Yes   Advised if further questions arise to please call the device clinic at [phone].

## 2022-07-10 ENCOUNTER — Ambulatory Visit
Admission: RE | Admit: 2022-07-10 | Discharge: 2022-07-10 | Disposition: A | Discharge status: Home / Self Care | Payer: Medicare Other | Source: Ambulatory Visit | Attending: Family Medicine | Admitting: Family Medicine

## 2022-07-10 DIAGNOSIS — Z1231 Encounter for screening mammogram for malignant neoplasm of breast: Secondary | ICD-10-CM

## 2022-07-15 ENCOUNTER — Telehealth: Payer: Self-pay | Admitting: Internal Medicine

## 2022-07-15 ENCOUNTER — Ambulatory Visit (INDEPENDENT_AMBULATORY_CARE_PROVIDER_SITE_OTHER): Payer: Medicare Other

## 2022-07-15 VITALS — Ht 66.0 in | Wt 345.0 lb

## 2022-07-15 DIAGNOSIS — Z0000 Encounter for general adult medical examination without abnormal findings: Secondary | ICD-10-CM

## 2022-07-15 MED ORDER — AZITHROMYCIN 250 MG PO TABS: 250.0000 mg | ORAL_TABLET | Freq: Every day | ORAL | 0 refills | Status: DC

## 2022-07-15 NOTE — Telephone Encounter (Signed)
Called and spoke to patient and went over medication with her. Verified pharmacy with her on the phone. Nothing further needed

## 2022-07-15 NOTE — Telephone Encounter (Signed)
Patient called to ask if she can get an antibiotic called in because she believes she has a lung infection.  Please call patient to discuss further.  CB# [phone]

## 2022-07-15 NOTE — Telephone Encounter (Signed)
z-pak 

## 2022-07-15 NOTE — Progress Notes (Addendum)
I connected with  Pamela [name] on 07/22/22 by a audio enabled telemedicine application and verified that I am speaking with the correct person using two identifiers.  Patient Location: Home  Provider Location: Home Office  I discussed the limitations of evaluation and management by telemedicine. The patient expressed understanding and agreed to proceed.  Subjective:   Pamela [name] is a 71 y.o. female who presents for Medicare Annual (Subsequent) preventive examination.  Review of Systems    Defer to pcp Cardiac Risk Factors include: advanced age (>77mn, >>46women);obesity (BMI >30kg/m2);sedentary lifestyle     Objective:    Today's Vitals   07/15/22 0904  Weight: (!) 345 lb (156.5 kg)  Height: _0  (1.676 m)   Body mass index is 55.68 kg/m.     07/15/2022    9:12 AM 04/04/2022    6:00 AM 04/03/2022    4:49 PM 12/03/2020    5:05 PM 12/17/2019    3:50 PM 12/17/2019    9:22 AM 07/27/2019    4:05 PM  Advanced Directives  Does Patient Have a Medical Advance Directive? Yes Yes Yes No No No No  Type of AParamedicof AQuinnipiac UniversityLiving will;Out of facility DNR (pink MOST or yellow form) Healthcare Power of [place] will      Does patient want to make changes to medical advance directive? No - Patient declined No - Patient declined       Copy of HA[place]ustain Chart? Yes - validated most recent copy scanned in chart (See row information) No - copy requested       Would patient like information on creating a medical advance directive?    No - Patient declined Yes (Inpatient - patient requests chaplain consult to create a medical advance directive)  Yes (ED - Information included in AVS)    Current Medications (verified) Outpatient Encounter Medications as of 07/15/2022  Medication Sig   acetaminophen (TYLENOL) 500 MG tablet Take 1,000 mg by mouth every 6 (six) hours as needed for mild pain.   albuterol  (PROAIR HFA) 108 (90 Base) MCG/ACT inhaler Inhale 2 puffs into the lungs every 4 (four) hours as needed for wheezing or shortness of breath.   albuterol (PROVENTIL) (2.5 MG/3ML) 0.083% nebulizer solution USE 1 VIAL IN NEBULIZER EVERY 6 HOURS AS NEEDED FOR WHEEZING FOR SHORTNESS OF BREATH   aspirin EC 81 MG tablet Take 1 tablet (81 mg total) by mouth daily. Swallow whole.   calcitRIOL (ROCALTROL) 0.25 MCG capsule Take 1 capsule (0.25 mcg total) by mouth daily.   cetirizine (ZYRTEC) 10 MG chewable tablet Chew 10 mg by mouth daily.   docusate sodium (STOOL SOFTENER) 100 MG capsule Take 100 mg by mouth daily.   febuxostat (ULORIC) 40 MG tablet Take 1 tablet (40 mg total) by mouth daily.   Ferrous Sulfate (IRON PO) Take 1 tablet by mouth daily.   Fluticasone-Umeclidin-Vilant (TRELEGY ELLIPTA) 200-62.5-25 MCG/ACT AEPB Inhale 1 puff into the lungs daily.   furosemide (LASIX) 40 MG tablet Take 1 tablet (40 mg total) by mouth daily.   montelukast (SINGULAIR) 10 MG tablet Take 1 tablet (10 mg total) by mouth at bedtime.   Vitamin D, Ergocalciferol, (DRISDOL) 1.25 MG (50000 UNIT) CAPS capsule Take 1 capsule (50,000 Units total) by mouth every 7 (seven) days. (Patient not taking: Reported on 07/15/2022)   No facility-administered encounter medications on file as of 07/15/2022.    Allergies (verified) Sulfa antibiotics   History: Past  Medical History:  Diagnosis Date   Arthritis    Asthma    Cancer ([place])    lung, adenocarcinoma right lung 2012   CHF (congestive heart failure) ([facility])    Preserved EF   Congenital single kidney    With chronic kidney disease   COPD (chronic obstructive pulmonary disease) ([place])    Gout    Hypertension    Lung cancer ([place]) 2012   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 Patient notes it was  consistent with sarcoidosis but actual pathology results not available.   Oxygen deficiency    Sarcoidosis    Sickle cell trait ([place])    Sleep apnea    Past Surgical History:  Procedure Laterality Date   LOOP RECORDER INSERTION N/A 09/08/2018   Procedure: LOOP RECORDER INSERTION;  Surgeon: Evans Lance, MD;  Location: [facility];  Service: Cardiovascular;  Laterality: N/A;   LUNG BIOPSY     TEE WITHOUT CARDIOVERSION N/A 09/08/2018   Procedure: TRANSESOPHAGEAL ECHOCARDIOGRAM (TEE);  Surgeon: Josue Hector, MD;  Location: [facility] ENDOSCOPY;  Service: Cardiovascular;  Laterality: N/A;  with loop   TUBAL LIGATION     VEIN LIGATION AND STRIPPING     Family History  Problem Relation Age of Onset   Hypertension Mother    Renal Disease Mother    High blood pressure Mother    Heart disease Mother    Cancer Father        stomach   Heart disease Father        No details   Cervical cancer Sister    Diabetes Sister    Multiple myeloma Sister    Cancer Brother    Diabetes Brother    Social History   Socioeconomic History   Marital status: Divorced    Spouse name: Not on file   Number of children: 2   Years of education: Not on file   Highest education level: Not on file  Occupational History   Not on file  Tobacco Use   Smoking status: Never   Smokeless tobacco: Never  Vaping Use   Vaping Use: Never used  Substance and Sexual Activity   Alcohol use: No    Alcohol/week: 0.0 standard drinks of alcohol   Drug use: No   Sexual activity: Not Currently  Other Topics Concern   Not on file  Social History Narrative   Lives with son.  One son is deceased.     Social Determinants of Health   Financial Resource Strain: Low Risk  (07/15/2022)   Overall Financial Resource Strain (CARDIA)    Difficulty of Paying Living Expenses: Not hard at all  Food Insecurity: No Food Insecurity (07/15/2022)   Hunger Vital Sign    Worried About Running Out of Food in the Last Year: Never  true    Ran Out of Food in the Last Year: Never true  Transportation Needs: No Transportation Needs (07/15/2022)   PRAPARE - Hydrologist (Medical): No    Lack of Transportation (Non-Medical): No  Physical Activity: Inactive (07/15/2022)   Exercise Vital Sign    Days of Exercise per Week: 0 days    Minutes of Exercise per Session: 0 min  Stress: No Stress Concern Present (07/15/2022)   [employer] of  Occupational Health - Occupational Stress Questionnaire    Feeling of Stress : Not at all  Social Connections: Moderately Integrated (07/15/2022)   Social Connection and Isolation Panel [NHANES]    Frequency of Communication with Friends and Family: More than three times a week    Frequency of Social Gatherings with Friends and Family: More than three times a week    Attends Religious Services: More than 4 times per year    Active Member of [employer] or Organizations: Yes    Attends Music therapist: More than 4 times per year    Marital Status: Divorced    Tobacco Counseling Counseling given: Not Answered   Clinical Intake:  Pre-visit preparation completed: Yes  Pain : No/denies pain     Nutritional Status: BMI > 30  Obese Nutritional Risks: None Diabetes: No  How often do you need to have someone help you when you read instructions, pamphlets, or other written materials from your doctor or pharmacy?: 1 - Never What is the last grade level you completed in school?: college  Diabetic?no  Interpreter Needed?: No      Activities of Daily Living    07/15/2022    9:13 AM 04/04/2022    6:00 AM  In your present state of health, do you have any difficulty performing the following activities:  Hearing? 0 0  Vision? 0 0  Difficulty concentrating or making decisions? 0 0  Walking or climbing stairs? 0 1  Dressing or bathing? 0 0  Doing errands, shopping? 0 0  Preparing Food and eating ? N   Using the Toilet? N   In the past  six months, have you accidently leaked urine? N   Do you have problems with loss of bowel control? N   Managing your Medications? N   Managing your Finances? N   Housekeeping or managing your Housekeeping? N     Patient Care Team: Dorena Dew, FNP as PCP - General (Family Medicine) Werner Lean, MD as PCP - Cardiology (Cardiology)  Indicate any recent Medical Services you may have received from other than [facility] providers in the past year (date may be approximate).     Assessment:   This is a routine wellness examination for Pamela [name].  Hearing/Vision screen No results found.  Dietary issues and exercise activities discussed: Current Exercise Habits: The patient does not participate in regular exercise at present, Exercise limited by: None identified   Goals Addressed   None   Depression Screen    07/15/2022    9:11 AM 01/29/2022    7:52 AM 12/29/2021    1:33 PM 10/21/2021   11:37 AM 09/11/2021    9:35 AM 07/15/2021    9:36 AM 04/10/2021    9:17 AM  PHQ 2/9 Scores  PHQ - 2 Score 0 0 0 0 0 0 0  PHQ- 9 Score  5  0       Fall Risk    07/15/2022    9:13 AM 04/21/2022   10:34 AM 12/29/2021    1:32 PM 10/21/2021   11:37 AM 09/11/2021    9:35 AM  Fall Risk   Falls in the past year? 1 0 0 0 0  Number falls in past yr: 0 0 0 0 0  Injury with Fall? 0 0 0 0 0  Risk for fall due to : History of fall(s) No Fall Risks  No Fall Risks   Follow up Falls evaluation completed Falls evaluation completed  Falls  evaluation completed     FALL RISK PREVENTION PERTAINING TO THE HOME:  Any stairs in or around the home? Yes  If so, are there any without handrails? Yes  Home free of loose throw rugs in walkways, pet beds, electrical cords, etc? No  Adequate lighting in your home to reduce risk of falls? Yes   ASSISTIVE DEVICES UTILIZED TO PREVENT FALLS:  Life alert? No  Use of a cane, walker or w/c? Yes  Grab bars in the bathroom? Yes  Shower chair or bench in shower? No   Elevated toilet seat or a handicapped toilet? No   TIMED UP AND GO:  Was the test performed? No .  Length of time to ambulate 10 feet: n/a sec.     Cognitive Function:    06/15/2018   11:56 AM  MMSE - Mini Mental State Exam  Orientation to time 5  Orientation to Place 5  Registration 3  Attention/ Calculation 5  Recall 3  Language- name 2 objects 2  Language- repeat 1  Language- follow 3 step command 3  Language- read & follow direction 1  Write a sentence 1  Copy design 1  Total score 30        07/15/2022    9:14 AM  6CIT Screen  What Year? 0 points  What month? 0 points  What time? 0 points  Count back from 20 0 points  Months in reverse 0 points  Repeat phrase 0 points  Total Score 0 points    Immunizations Immunization History  Administered Date(s) Administered   Influenza,inj,Quad PF,6+ Mos 04/13/2014, 04/16/2015, 04/03/2016, 05/19/2017, 06/15/2018, 05/14/2019, 04/21/2022   PFIZER(Purple Top)SARS-COV-2 Vaccination 10/14/2019, 11/08/2019, 07/08/2020, 03/06/2021   Pneumococcal Conjugate-13 04/03/2016   Pneumococcal Polysaccharide-23 04/13/2014, 12/26/2019   Tdap 01/02/2015   Zoster, Live 05/21/2014    TDAP status: Up to date  Flu Vaccine status: Up to date  Pneumococcal vaccine status: Up to date  Covid-19 vaccine status: Completed vaccines  Qualifies for Shingles Vaccine? Yes   Zostavax completed Yes   Shingrix Completed?: No.    Education has been provided regarding the importance of this vaccine. Patient has been advised to call insurance company to determine out of pocket expense if they have not yet received this vaccine. Advised may also receive vaccine at local pharmacy or Health Dept. Verbalized acceptance and understanding.  Screening Tests Health Maintenance  Topic Date Due   Zoster Vaccines- Shingrix (1 of 2) Never done   COVID-19 Vaccine (5 - 2023-24 season) 03/20/2022   Medicare Annual Wellness (AWV)  07/16/2023   MAMMOGRAM   07/10/2024   Fecal DNA (Cologuard)  10/08/2024   DTaP/Tdap/Td (2 - Td or Tdap) 01/01/2025   Pneumonia Vaccine 15+ Years old  Completed   DEXA SCAN  Completed   Hepatitis C Screening  Completed   HPV VACCINES  Aged Out    Health Maintenance  Health Maintenance Due  Topic Date Due   Zoster Vaccines- Shingrix (1 of 2) Never done   COVID-19 Vaccine (5 - 2023-24 season) 03/20/2022    Colorectal cancer screening: Type of screening: Cologuard. Completed 10/08/21. Repeat every 3 years  Mammogram status: Completed 07/10/22. Repeat every year  Bone Density status: Completed 05/05/2019. Results reflect: Bone density results: NORMAL. Repeat every 2 years.  Lung Cancer Screening: (Low Dose CT Chest recommended if Age 65-80 years, 30 pack-year currently smoking OR have quit w/in 15years.) does not qualify.   Lung Cancer Screening Referral: n/a  Additional Screening:  Hepatitis  C Screening: does not qualify; Completed n/a  Vision Screening: Recommended annual ophthalmology exams for early detection of glaucoma and other disorders of the eye. Is the patient up to date with their annual eye exam?  Yes  Who is the provider or what is the name of the office in which the patient attends annual eye exams? [facility] If pt is not established with a provider, would they like to be referred to a provider to establish care? No .   Dental Screening: Recommended annual dental exams for proper oral hygiene  Community Resource Referral / Chronic Care Management: CRR required this visit?  No   CCM required this visit?  No      Plan:     I have personally reviewed and noted the following in the patient's chart:   Medical and social history Use of alcohol, tobacco or illicit drugs  Current medications and supplements including opioid prescriptions. Patient is not currently taking opioid prescriptions. Functional ability and status Nutritional status Physical activity Advanced  directives List of other physicians Hospitalizations, surgeries, and ER visits in previous 12 months Vitals Screenings to include cognitive, depression, and falls Referrals and appointments  In addition, I have reviewed and discussed with patient certain preventive protocols, quality metrics, and best practice recommendations. A written personalized care plan for preventive services as well as general preventive health recommendations were provided to patient.     Angus Seller, CMA   07/15/2022   Nurse Notes: Non face to face 28 minutes. Patient discussed her desire to continue working on losing weight. She walks around [employer] for exercise as often as she can. She needs a dexa ordered at [facility].  Appt made for next year.

## 2022-07-15 NOTE — Telephone Encounter (Signed)
Called and spoke with patient and she states that she feels like she is at the start of a lung infection. She states she can feel it building up in her chest and she wants to stop it before it gets worse. She is asking for an antibiotic.   She states she is having a mild cough right now but nothing too serious but she wants to stop it from getting too bad.  Please advises sir

## 2022-07-15 NOTE — Patient Instructions (Signed)
  Pamela [name] , Thank you for taking time to come for your Medicare Wellness Visit. I appreciate your ongoing commitment to your health goals. Please review the following plan we discussed and let me know if I can assist you in the future.   These are the goals we discussed:  Goals      LIFESTYLE - DECREASE FALLS RISK     [facility] - Set My Weight Loss Goal       Timeframe:  Long-Range Goal Priority:  Medium Start Date:           5/17                  Expected End Date:  11/17                Barriers: Health Behaviors    - set weight loss goal - lose 20 pounds   Why is this important?   Losing only 5 to 15 percent of your weight makes a big difference in your health.    Notes:   5/17 - Will provide member with education regarding weight loss.  6/16 - Verbalizes understanding regarding importance of daily weights.  Today was 341 pounds.  Monitoring but not recording.  Advised to record trends  9/8 - Report weight today was 344 pounds.  State she has been as low as 335, but has fluctuated back up, not going over 347.  She is watching her diet/salt intake.  Will increase exercise/walking to help achieve goal     [facility] - Track and Manage Fluids and Swelling-Heart Failure     Timeframe:  Short-Term Goal Priority:  High Start Date:         5/17                    Expected End Date:       10/17  Barriers: Health Behaviors Knowledge    - call office if I gain more than 2 pounds in one day or 5 pounds in one week - keep legs up while sitting - track weight in diary - use salt in moderation    Why is this important?   It is important to check your weight daily and watch how much salt and liquids you have.  It will help you to manage your heart failure.    Notes:   5/17 - Will send member education regarding low salt and HF zones  6/16 - Confirms that she received information in mail.  Educated on management of daily fluid status and assessment of swelling in legs.  Has  appointment with PCP on 6/21, will use [facility]  9/7 - Denies swelling, Lasix increased to 3 times a day.  Follow up with PCP on 9/23        This is a list of the screening recommended for you and due dates:  Health Maintenance  Topic Date Due   Zoster (Shingles) Vaccine (1 of 2) Never done   COVID-19 Vaccine (5 - 2023-24 season) 03/20/2022   Medicare Annual Wellness Visit  07/16/2023   Mammogram  07/10/2024   Cologuard (Stool DNA test)  10/08/2024   DTaP/Tdap/Td vaccine (2 - Td or Tdap) 01/01/2025   Pneumonia Vaccine  Completed   DEXA scan (bone density measurement)  Completed   Hepatitis C Screening: USPSTF Recommendation to screen - Ages 68-79 yo.  Completed   HPV Vaccine  Aged Out

## 2022-07-23 DIAGNOSIS — H524 Presbyopia: Secondary | ICD-10-CM | POA: Diagnosis not present

## 2022-07-23 NOTE — Progress Notes (Signed)
[facility] Summary Report / Loop Recorder 

## 2022-07-28 ENCOUNTER — Ambulatory Visit: Payer: PRIVATE HEALTH INSURANCE | Admitting: Family Medicine

## 2022-07-28 ENCOUNTER — Encounter: Payer: Self-pay | Admitting: Family Medicine

## 2022-07-28 VITALS — BP 126/91 | HR 74 | Temp 97.5°F | Ht 66.0 in | Wt 322.4 lb

## 2022-07-28 DIAGNOSIS — Z6841 Body Mass Index (BMI) 40.0 and over, adult: Secondary | ICD-10-CM

## 2022-07-28 DIAGNOSIS — R32 Unspecified urinary incontinence: Secondary | ICD-10-CM

## 2022-07-28 DIAGNOSIS — I10 Essential (primary) hypertension: Secondary | ICD-10-CM | POA: Diagnosis not present

## 2022-07-28 DIAGNOSIS — E876 Hypokalemia: Secondary | ICD-10-CM

## 2022-07-28 DIAGNOSIS — N183 Chronic kidney disease, stage 3 unspecified: Secondary | ICD-10-CM

## 2022-07-28 LAB — POCT URINALYSIS DIP (PROADVANTAGE DEVICE)
Bilirubin, UA: NEGATIVE
Blood, UA: NEGATIVE
Glucose, UA: NEGATIVE mg/dL
Ketones, POC UA: NEGATIVE mg/dL
Leukocytes, UA: NEGATIVE
Nitrite, UA: NEGATIVE
Protein Ur, POC: NEGATIVE mg/dL
Specific Gravity, Urine: 1.02
Urobilinogen, Ur: 0.2
pH, UA: 6 (ref 5.0–8.0)

## 2022-07-28 MED ORDER — DEPEND UNDERWEAR LARGE/XL MISC: 1.0000 | Freq: Two times a day (BID) | 12 refills | Status: AC

## 2022-07-28 NOTE — Progress Notes (Signed)
Established Patient Office Visit  Subjective   Patient ID: Pamela [name], female    DOB: 10/24/1950  Age: 72 y.o. MRN: [id number]  Chief Complaint  Patient presents with   Follow-up    Pamela [name] is a very pleasant 72 year old female with a medical history significant for essential hyperten CHF, sion, stage IIIb chronic kidney disease, COPD, history of CVA, chronic respiratory failure with hypoxia, history of lung cancer in remission, obesity, and metabolic syndrome presents for follow-up of chronic conditions.  Patient has been doing very well.  Since previous visit, patient has decreased her weight by 23 pounds.  She has been a patient at a weight loss clinic over the past 6 months.  Patient states that she has not changed what she eats, but has decreased her portions. Patient has been following up with her specialists as scheduled.  She has not had any COPD exacerbations over the past several months.  Patient has stage IIIb chronic kidney disease.  She is followed by nephrology closely.  Patient has history of solitary kidney.    Patient Active Problem List   Diagnosis Date Noted   COPD exacerbation ([place]) 04/04/2022   Vitamin D deficiency 02/02/2022   Stage 3b chronic kidney disease ([place]) 09/24/2020   Malignant neoplasm of lung ([place]) 09/24/2020   Aortic atherosclerosis ([place]) 09/24/2020   PFO (patent foramen ovale) 09/24/2020   History of stroke 09/24/2020   Acute respiratory failure with hypoxia ([place]) 12/17/2019   COPD with acute exacerbation ([place]) 12/17/2019   COVID-19 virus infection 01/02/2019   Right knee pain 09/15/2018   Closed head injury with concussion 09/06/2018   Hypokalemia 09/06/2018   Hypomagnesemia 09/06/2018   Asthma, chronic obstructive, with acute exacerbation ([place]) 08/02/2018   Plantar fasciitis 04/14/2017   Microcytosis 11/28/2015   Solitary kidney 07/18/2015   Onychomycosis 01/09/2015   Ingrown nail 01/09/2015   Pain in lower limb 01/09/2015    Chronic respiratory failure with hypoxia and hypercapnia (HCC) 07/19/2014   Anemia, iron deficiency 05/27/2014   Acute gouty arthritis 05/25/2014   Obstructive sleep apnea 05/25/2014   Joint pain 05/24/2014   Chronic diastolic CHF (congestive heart failure) ([place])    Sarcoidosis    Metabolic syndrome 19/14/7829   Asthma, chronic 04/27/2014   Anemia of chronic disease 04/19/2014   Morbid obesity ([place]) 04/18/2014   Immunization due 04/18/2014   Need for prophylactic vaccination and inoculation against influenza 04/18/2014   Prediabetes 04/13/2014   Essential hypertension 04/13/2014   Lower extremity edema 04/13/2014   History of lung cancer 04/13/2014   Past Medical History:  Diagnosis Date   Arthritis    Asthma    Cancer ([place])    lung, adenocarcinoma right lung 2012   CHF (congestive heart failure) (HCC)    Preserved EF   Congenital single kidney    With chronic kidney disease   COPD (chronic obstructive pulmonary disease) ([place])    Gout    Hypertension    Lung cancer ([place]) 2012   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 Patient notes it was consistent with sarcoidosis but actual pathology results not available.   Oxygen deficiency    Sarcoidosis    Sickle cell trait (HCC)    Sleep apnea    Past Surgical History:  Procedure Laterality Date   LOOP RECORDER  INSERTION N/A 09/08/2018   Procedure: LOOP RECORDER INSERTION;  Surgeon: Marinus Maw, MD;  Location[facility] INVASIVE CV LAB;  Service: Cardiovascular;  Laterality: N/A;   LUNG BIOPSY     TEE WITHOUT CARDIOVERSION N/A 09/08/2018   Procedure: TRANSESOPHAGEAL ECHOCARDIOGRAM (TEE);  Surgeon: Wendall Stade, MD;  Location: [facility] ENDOSCOPY;  Service: Cardiovascular;  Laterality: N/A;  with loop   TUBAL LIGATION     VEIN LIGATION AND STRIPPING     Social  History   Tobacco Use   Smoking status: Never   Smokeless tobacco: Never  Vaping Use   Vaping Use: Never used  Substance Use Topics   Alcohol use: No    Alcohol/week: 0.0 standard drinks of alcohol   Drug use: No   Social History   Socioeconomic History   Marital status: Divorced    Spouse name: Not on file   Number of children: 2   Years of education: Not on file   Highest education level: Not on file  Occupational History   Not on file  Tobacco Use   Smoking status: Never   Smokeless tobacco: Never  Vaping Use   Vaping Use: Never used  Substance and Sexual Activity   Alcohol use: No    Alcohol/week: 0.0 standard drinks of alcohol   Drug use: No   Sexual activity: Not Currently  Other Topics Concern   Not on file  Social History Narrative   Lives with son.  One son is deceased.     Social Determinants of Health   Financial Resource Strain: Low Risk  (07/15/2022)   Overall Financial Resource Strain (CARDIA)    Difficulty of Paying Living Expenses: Not hard at all  Food Insecurity: No Food Insecurity (07/15/2022)   Hunger Vital Sign    Worried About Running Out of Food in the Last Year: Never true    Ran Out of Food in the Last Year: Never true  Transportation Needs: No Transportation Needs (07/15/2022)   PRAPARE - Administrator, Civil Service (Medical): No    Lack of Transportation (Non-Medical): No  Physical Activity: Inactive (07/15/2022)   Exercise Vital Sign    Days of Exercise per Week: 0 days    Minutes of Exercise per Session: 0 min  Stress: No Stress Concern Present (07/15/2022)   [employer] of Occupational Health - Occupational Stress Questionnaire    Feeling of Stress : Not at all  Social Connections: Moderately Integrated (07/15/2022)   Social Connection and Isolation Panel [NHANES]    Frequency of Communication with Friends and Family: More than three times a week    Frequency of Social Gatherings with Friends and Family: More  than three times a week    Attends Religious Services: More than 4 times per year    Active Member of [employer] or Organizations: Yes    Attends Banker Meetings: More than 4 times per year    Marital Status: Divorced  Intimate Partner Violence: Not At Risk (07/15/2022)   Humiliation, Afraid, Rape, and Kick questionnaire    Fear of Current or Ex-Partner: No    Emotionally Abused: No    Physically Abused: No    Sexually Abused: No   Family Status  Relation Name Status   Mother  Deceased   Father  Deceased   Sister  (Not Specified)   Sister  (Not Specified)   Brother  (Not Specified)   Family History  Problem Relation Age of Onset   Hypertension  Mother    Renal Disease Mother    High blood pressure Mother    Heart disease Mother    Cancer Father        stomach   Heart disease Father        No details   Cervical cancer Sister    Diabetes Sister    Multiple myeloma Sister    Cancer Brother    Diabetes Brother    Allergies  Allergen Reactions   Sulfa Antibiotics Hives and Rash      Review of Systems  Constitutional: Negative.   HENT: Negative.    Respiratory: Negative.    Cardiovascular: Negative.   Gastrointestinal: Negative.   Genitourinary: Negative.   Musculoskeletal:  Positive for back pain and joint pain.  Skin: Negative.   Neurological: Negative.   Psychiatric/Behavioral: Negative.        Objective:     BP (!) 126/91   Pulse 74   Temp (!) 97.5 F (36.4 C)   Ht 5\' 6"  (1.676 m)   Wt (!) 322 lb 6.4 oz (146.2 kg)   SpO2 94%   BMI 52.04 kg/m  BP Readings from Last 3 Encounters:  07/28/22 (!) 126/91  05/22/22 112/70  04/21/22 130/66   Wt Readings from Last 3 Encounters:  07/28/22 (!) 322 lb 6.4 oz (146.2 kg)  07/15/22 (!) 345 lb (156.5 kg)  05/22/22 (!) 342 lb (155.1 kg)      Physical Exam Constitutional:      Appearance: She is obese.  Eyes:     Pupils: Pupils are equal, round, and reactive to light.  Cardiovascular:     Rate  and Rhythm: Normal rate and regular rhythm.     Pulses: Normal pulses.  Pulmonary:     Effort: Pulmonary effort is normal.  Abdominal:     General: Bowel sounds are normal.  Musculoskeletal:        General: Normal range of motion.     Right lower leg: 1+ Pitting Edema present.     Left lower leg: 1+ Pitting Edema present.  Skin:    General: Skin is warm.  Neurological:     General: No focal deficit present.     Mental Status: Mental status is at baseline.  Psychiatric:        Mood and Affect: Mood normal.        Behavior: Behavior normal.        Thought Content: Thought content normal.        Judgment: Judgment normal.      Results for orders placed or performed in visit on 07/28/22  CMP and Liver  Result Value Ref Range   Glucose 86 70 - 99 mg/dL   BUN 17 8 - 27 mg/dL   Creatinine, Ser 09/26/22 (H) 0.57 - 1.00 mg/dL   eGFR 43 (L) 2.58 >02   Sodium 145 (H) 134 - 144 mmol/L   Potassium 3.6 3.5 - 5.2 mmol/L   Chloride 99 96 - 106 mmol/L   CO2 31 (H) 20 - 29 mmol/L   Calcium 8.5 (L) 8.7 - 10.3 mg/dL   Total Protein 6.8 6.0 - 8.5 g/dL   Albumin 4.1 3.9 - 4.9 g/dL   Bilirubin Total 0.3 0.0 - 1.2 mg/dL   Bilirubin, Direct CO/GTW/1.23 0.00 - 0.40 mg/dL   Alkaline Phosphatase 148 (H) 44 - 121 IU/L   AST 18 0 - 40 IU/L   ALT 8 0 - 32 IU/L  POCT Urinalysis DIP (Proadvantage Device)  Result Value Ref Range   Color, UA yellow yellow   Clarity, UA clear clear   Glucose, UA negative negative mg/dL   Bilirubin, UA negative negative   Ketones, POC UA negative negative mg/dL   Specific Gravity, Urine 1.020    Blood, UA negative negative   pH, UA 6.0 5.0 - 8.0   Protein Ur, POC negative negative mg/dL   Urobilinogen, Ur 0.2    Nitrite, UA Negative Negative   Leukocytes, UA Negative Negative    Last CBC Lab Results  Component Value Date   WBC 4.4 04/08/2022   HGB 11.9 (L) 04/08/2022   HCT 39.5 04/08/2022   MCV 73.4 (L) 04/08/2022   MCH 22.1 (L) 04/08/2022   RDW 15.6 (H)  04/08/2022   PLT 171 04/08/2022   Last metabolic panel Lab Results  Component Value Date   GLUCOSE 86 07/28/2022   NA 145 (H) 07/28/2022   K 3.6 07/28/2022   CL 99 07/28/2022   CO2 31 (H) 07/28/2022   BUN 17 07/28/2022   CREATININE 1.34 (H) 07/28/2022   EGFR 43 (L) 07/28/2022   CALCIUM 8.5 (L) 07/28/2022   PHOS 4.2 12/19/2019   PROT 6.8 07/28/2022   ALBUMIN 4.1 07/28/2022   LABGLOB 2.9 01/29/2022   AGRATIO 1.4 01/29/2022   BILITOT 0.3 07/28/2022   ALKPHOS 148 (H) 07/28/2022   AST 18 07/28/2022   ALT 8 07/28/2022   ANIONGAP 11 04/08/2022   Last lipids Lab Results  Component Value Date   CHOL 166 01/29/2022   HDL 55 01/29/2022   LDLCALC 96 01/29/2022   TRIG 78 01/29/2022   CHOLHDL 2.7 03/01/2019   Last hemoglobin A1c Lab Results  Component Value Date   HGBA1C 6.0 (H) 01/29/2022   Last thyroid functions Lab Results  Component Value Date   TSH 1.870 01/29/2022   Last vitamin D Lab Results  Component Value Date   VD25OH 8.3 (L) 01/29/2022   Last vitamin B12 and Folate Lab Results  Component Value Date   VITAMINB12 830 01/29/2022   FOLATE 13.8 05/25/2014      The ASCVD Risk score (Arnett DK, et al., 2019) failed to calculate for the following reasons:   The patient has a prior MI or stroke diagnosis    Assessment & Plan:   Problem List Items Addressed This Visit       Cardiovascular and Mediastinum   Essential hypertension   Relevant Orders   CMP and Liver (Completed)     Other   Hypokalemia   Other Visit Diagnoses     Stage 3 chronic kidney disease, unspecified whether stage 3a or 3b CKD (HCC)    -  Primary   Relevant Orders   CMP and Liver (Completed)   POCT Urinalysis DIP (Proadvantage Device) (Completed)   Class 3 severe obesity due to excess calories with serious comorbidity and body mass index (BMI) of 50.0 to 59.9 in adult [facility])       Urinary incontinence, unspecified type       Relevant Medications   Incontinence Supply Disposable  (DEPEND UNDERWEAR LARGE/XL) MISC   Other Relevant Orders   POCT Urinalysis DIP (Proadvantage Device) (Completed)       Return in about 3 months (around 10/27/2022) for obesity, hypertension, hyperlipidemia.   Nolon Nations  APRN, MSN, FNP-C Patient Care [facility] [address] [place], [place] [address] [phone]

## 2022-07-29 LAB — CMP AND LIVER
ALT: 8 IU/L (ref 0–32)
AST: 18 IU/L (ref 0–40)
Albumin: 4.1 g/dL (ref 3.9–4.9)
Alkaline Phosphatase: 148 IU/L — ABNORMAL HIGH (ref 44–121)
BUN: 17 mg/dL (ref 8–27)
Bilirubin Total: 0.3 mg/dL (ref 0.0–1.2)
Bilirubin, Direct: 0.11 mg/dL (ref 0.00–0.40)
CO2: 31 mmol/L — ABNORMAL HIGH (ref 20–29)
Calcium: 8.5 mg/dL — ABNORMAL LOW (ref 8.7–10.3)
Chloride: 99 mmol/L (ref 96–106)
Creatinine, Ser: 1.34 mg/dL — ABNORMAL HIGH (ref 0.57–1.00)
Glucose: 86 mg/dL (ref 70–99)
Potassium: 3.6 mmol/L (ref 3.5–5.2)
Sodium: 145 mmol/L — ABNORMAL HIGH (ref 134–144)
Total Protein: 6.8 g/dL (ref 6.0–8.5)
eGFR: 43 mL/min/{1.73_m2} — ABNORMAL LOW (ref >59)

## 2022-07-31 ENCOUNTER — Telehealth: Payer: Self-pay | Admitting: Family Medicine

## 2022-07-31 NOTE — Telephone Encounter (Signed)
Not sure where this can be noted However the pt said that from here on out her prescriptions need to be faxed to ::  [facility]) [phone]

## 2022-08-03 NOTE — Telephone Encounter (Signed)
Called pt to find out the name of the mail order pharmacy . Pt will call us back to let us know . KH

## 2022-08-05 NOTE — Progress Notes (Signed)
Pamela [name] is a 72 year old female with multiple comorbidities that presented for a 45-month follow-up of her chronic conditions.  On reviewing laboratory values, serum creatinine was 1.34, which is improved from her baseline.  Please fax CMP results to [facility].  All other laboratory values were unremarkable.  Please congratulate this patient on her 23 pound weight loss and remind her to continue to follow her balanced diet as discussed during appointment.  Also, remind patient of the importance of hydrating throughout the day.  We will continue to follow-up every 3 months. Nolon Nations  APRN, MSN, FNP-C Patient Care [facility] [address] [place], [place] [address] [phone]

## 2022-08-16 ENCOUNTER — Inpatient Hospital Stay (HOSPITAL_COMMUNITY)
Admission: EM | Admit: 2022-08-16 | Discharge: 2022-08-25 | DRG: 190 | Disposition: A | Discharge status: Home Health | Payer: Medicare HMO | Attending: Internal Medicine | Admitting: Internal Medicine

## 2022-08-16 ENCOUNTER — Other Ambulatory Visit: Payer: Self-pay

## 2022-08-16 ENCOUNTER — Emergency Department (HOSPITAL_COMMUNITY): Payer: Medicare HMO

## 2022-08-16 ENCOUNTER — Encounter (HOSPITAL_COMMUNITY): Payer: Self-pay

## 2022-08-16 DIAGNOSIS — E559 Vitamin D deficiency, unspecified: Secondary | ICD-10-CM | POA: Diagnosis present

## 2022-08-16 DIAGNOSIS — Z841 Family history of disorders of kidney and ureter: Secondary | ICD-10-CM | POA: Diagnosis not present

## 2022-08-16 DIAGNOSIS — R918 Other nonspecific abnormal finding of lung field: Secondary | ICD-10-CM | POA: Diagnosis not present

## 2022-08-16 DIAGNOSIS — Z833 Family history of diabetes mellitus: Secondary | ICD-10-CM | POA: Diagnosis not present

## 2022-08-16 DIAGNOSIS — Z7982 Long term (current) use of aspirin: Secondary | ICD-10-CM | POA: Diagnosis not present

## 2022-08-16 DIAGNOSIS — Z8249 Family history of ischemic heart disease and other diseases of the circulatory system: Secondary | ICD-10-CM | POA: Diagnosis not present

## 2022-08-16 DIAGNOSIS — J9622 Acute and chronic respiratory failure with hypercapnia: Secondary | ICD-10-CM | POA: Diagnosis present

## 2022-08-16 DIAGNOSIS — Z882 Allergy status to sulfonamides status: Secondary | ICD-10-CM | POA: Diagnosis not present

## 2022-08-16 DIAGNOSIS — I110 Hypertensive heart disease with heart failure: Secondary | ICD-10-CM | POA: Diagnosis not present

## 2022-08-16 DIAGNOSIS — N1832 Chronic kidney disease, stage 3b: Secondary | ICD-10-CM | POA: Diagnosis present

## 2022-08-16 DIAGNOSIS — Z807 Family history of other malignant neoplasms of lymphoid, hematopoietic and related tissues: Secondary | ICD-10-CM

## 2022-08-16 DIAGNOSIS — Z8049 Family history of malignant neoplasm of other genital organs: Secondary | ICD-10-CM | POA: Diagnosis not present

## 2022-08-16 DIAGNOSIS — Z6841 Body Mass Index (BMI) 40.0 and over, adult: Secondary | ICD-10-CM

## 2022-08-16 DIAGNOSIS — R7303 Prediabetes: Secondary | ICD-10-CM | POA: Diagnosis present

## 2022-08-16 DIAGNOSIS — J9621 Acute and chronic respiratory failure with hypoxia: Secondary | ICD-10-CM | POA: Diagnosis present

## 2022-08-16 DIAGNOSIS — J441 Chronic obstructive pulmonary disease with (acute) exacerbation: Secondary | ICD-10-CM | POA: Diagnosis not present

## 2022-08-16 DIAGNOSIS — G4733 Obstructive sleep apnea (adult) (pediatric): Secondary | ICD-10-CM | POA: Diagnosis present

## 2022-08-16 DIAGNOSIS — M109 Gout, unspecified: Secondary | ICD-10-CM | POA: Diagnosis present

## 2022-08-16 DIAGNOSIS — Z85118 Personal history of other malignant neoplasm of bronchus and lung: Secondary | ICD-10-CM | POA: Diagnosis not present

## 2022-08-16 DIAGNOSIS — I130 Hypertensive heart and chronic kidney disease with heart failure and stage 1 through stage 4 chronic kidney disease, or unspecified chronic kidney disease: Secondary | ICD-10-CM | POA: Diagnosis present

## 2022-08-16 DIAGNOSIS — I509 Heart failure, unspecified: Secondary | ICD-10-CM | POA: Diagnosis not present

## 2022-08-16 DIAGNOSIS — I5032 Chronic diastolic (congestive) heart failure: Secondary | ICD-10-CM | POA: Diagnosis not present

## 2022-08-16 DIAGNOSIS — E875 Hyperkalemia: Secondary | ICD-10-CM | POA: Diagnosis not present

## 2022-08-16 DIAGNOSIS — Z79899 Other long term (current) drug therapy: Secondary | ICD-10-CM

## 2022-08-16 DIAGNOSIS — Z9981 Dependence on supplemental oxygen: Secondary | ICD-10-CM

## 2022-08-16 DIAGNOSIS — J9602 Acute respiratory failure with hypercapnia: Secondary | ICD-10-CM | POA: Diagnosis present

## 2022-08-16 LAB — CBC WITH DIFFERENTIAL/PLATELET
Abs Immature Granulocytes: 0.04 10*3/uL (ref 0.00–0.07)
Basophils Absolute: 0 10*3/uL (ref 0.0–0.1)
Basophils Relative: 1 %
Eosinophils Absolute: 1.6 10*3/uL — ABNORMAL HIGH (ref 0.0–0.5)
Eosinophils Relative: 22 %
HCT: 40.7 % (ref 36.0–46.0)
Hemoglobin: 12.1 g/dL (ref 12.0–15.0)
Immature Granulocytes: 1 %
Lymphocytes Relative: 14 %
Lymphs Abs: 1 10*3/uL (ref 0.7–4.0)
MCH: 22.1 pg — ABNORMAL LOW (ref 26.0–34.0)
MCHC: 29.7 g/dL — ABNORMAL LOW (ref 30.0–36.0)
MCV: 74.4 fL — ABNORMAL LOW (ref 80.0–100.0)
Monocytes Absolute: 0.5 10*3/uL (ref 0.1–1.0)
Monocytes Relative: 6 %
Neutro Abs: 4.1 10*3/uL (ref 1.7–7.7)
Neutrophils Relative %: 56 %
Platelets: 156 10*3/uL (ref 150–400)
RBC: 5.47 MIL/uL — ABNORMAL HIGH (ref 3.87–5.11)
RDW: 15.1 % (ref 11.5–15.5)
WBC: 7.3 10*3/uL (ref 4.0–10.5)
nRBC: 0 % (ref 0.0–0.2)

## 2022-08-16 LAB — COMPREHENSIVE METABOLIC PANEL
ALT: 9 U/L (ref 0–44)
AST: 16 U/L (ref 15–41)
Albumin: 4 g/dL (ref 3.5–5.0)
Alkaline Phosphatase: 114 U/L (ref 38–126)
Anion gap: 12 (ref 5–15)
BUN: 26 mg/dL — ABNORMAL HIGH (ref 8–23)
CO2: 32 mmol/L (ref 22–32)
Calcium: 8.3 mg/dL — ABNORMAL LOW (ref 8.9–10.3)
Chloride: 97 mmol/L — ABNORMAL LOW (ref 98–111)
Creatinine, Ser: 1.45 mg/dL — ABNORMAL HIGH (ref 0.44–1.00)
GFR, Estimated: 39 mL/min — ABNORMAL LOW (ref >60)
Glucose, Bld: 104 mg/dL — ABNORMAL HIGH (ref 70–99)
Potassium: 3.8 mmol/L (ref 3.5–5.1)
Sodium: 141 mmol/L (ref 135–145)
Total Bilirubin: 0.5 mg/dL (ref 0.3–1.2)
Total Protein: 7.2 g/dL (ref 6.5–8.1)

## 2022-08-16 LAB — TROPONIN I (HIGH SENSITIVITY)
Troponin I (High Sensitivity): 15 ng/L (ref <18)
Troponin I (High Sensitivity): 13 ng/L (ref <18)

## 2022-08-16 LAB — BRAIN NATRIURETIC PEPTIDE: B Natriuretic Peptide: 37.9 pg/mL (ref 0.0–100.0)

## 2022-08-16 MED ORDER — ASPIRIN 81 MG PO TBEC
81.0000 mg | DELAYED_RELEASE_TABLET | Freq: Every day | ORAL | Status: DC
  Administered 2022-08-17 – 2022-08-25 (×9): 81 mg via ORAL
  Filled 2022-08-16 (×9): qty 1

## 2022-08-16 MED ORDER — FUROSEMIDE 40 MG PO TABS
40.0000 mg | ORAL_TABLET | Freq: Every day | ORAL | Status: DC
  Administered 2022-08-17 – 2022-08-25 (×9): 40 mg via ORAL
  Filled 2022-08-16 (×9): qty 1

## 2022-08-16 MED ORDER — IPRATROPIUM-ALBUTEROL 0.5-2.5 (3) MG/3ML IN SOLN
3.0000 mL | Freq: Four times a day (QID) | RESPIRATORY_TRACT | Status: DC
  Administered 2022-08-16 – 2022-08-18 (×7): 3 mL via RESPIRATORY_TRACT
  Filled 2022-08-16 (×7): qty 3

## 2022-08-16 MED ORDER — DOCUSATE SODIUM 100 MG PO CAPS: 100.0000 mg | ORAL_CAPSULE | Freq: Two times a day (BID) | ORAL | Status: DC | PRN

## 2022-08-16 MED ORDER — FUROSEMIDE 10 MG/ML IJ SOLN
60.0000 mg | Freq: Once | INTRAMUSCULAR | Status: AC
  Administered 2022-08-16: 60 mg via INTRAVENOUS
  Filled 2022-08-16: qty 8

## 2022-08-16 MED ORDER — ACETAMINOPHEN 650 MG RE SUPP: 650.0000 mg | Freq: Four times a day (QID) | RECTAL | Status: DC | PRN

## 2022-08-16 MED ORDER — METHYLPREDNISOLONE SODIUM SUCC 125 MG IJ SOLR
125.0000 mg | Freq: Once | INTRAMUSCULAR | Status: AC
  Administered 2022-08-16: 125 mg via INTRAVENOUS
  Filled 2022-08-16: qty 2

## 2022-08-16 MED ORDER — ALBUTEROL SULFATE (2.5 MG/3ML) 0.083% IN NEBU: 2.5000 mg | INHALATION_SOLUTION | RESPIRATORY_TRACT | Status: DC | PRN

## 2022-08-16 MED ORDER — FUROSEMIDE 40 MG PO TABS: 40.0000 mg | ORAL_TABLET | Freq: Every day | ORAL | Status: DC

## 2022-08-16 MED ORDER — IPRATROPIUM-ALBUTEROL 0.5-2.5 (3) MG/3ML IN SOLN
3.0000 mL | Freq: Once | RESPIRATORY_TRACT | Status: AC
  Administered 2022-08-16: 3 mL via RESPIRATORY_TRACT
  Filled 2022-08-16: qty 3

## 2022-08-16 MED ORDER — ONDANSETRON HCL 4 MG PO TABS: 4.0000 mg | ORAL_TABLET | Freq: Four times a day (QID) | ORAL | Status: DC | PRN

## 2022-08-16 MED ORDER — ACETAMINOPHEN 325 MG PO TABS
650.0000 mg | ORAL_TABLET | Freq: Four times a day (QID) | ORAL | Status: DC | PRN
  Administered 2022-08-16 – 2022-08-25 (×6): 650 mg via ORAL
  Filled 2022-08-16 (×6): qty 2

## 2022-08-16 MED ORDER — FEBUXOSTAT 40 MG PO TABS
40.0000 mg | ORAL_TABLET | Freq: Every day | ORAL | Status: DC
  Administered 2022-08-17 – 2022-08-25 (×9): 40 mg via ORAL
  Filled 2022-08-16 (×9): qty 1

## 2022-08-16 MED ORDER — MONTELUKAST SODIUM 10 MG PO TABS
10.0000 mg | ORAL_TABLET | Freq: Every day | ORAL | Status: DC
  Administered 2022-08-17 – 2022-08-24 (×8): 10 mg via ORAL
  Filled 2022-08-16 (×8): qty 1

## 2022-08-16 MED ORDER — ENOXAPARIN SODIUM 80 MG/0.8ML IJ SOSY
65.0000 mg | PREFILLED_SYRINGE | Freq: Every day | INTRAMUSCULAR | Status: DC
  Administered 2022-08-16 – 2022-08-22 (×7): 65 mg via SUBCUTANEOUS
  Filled 2022-08-16 (×4): qty 0.8
  Filled 2022-08-16: qty 0.65
  Filled 2022-08-16 (×2): qty 0.8

## 2022-08-16 MED ORDER — LORATADINE 10 MG PO TABS
10.0000 mg | ORAL_TABLET | Freq: Every day | ORAL | Status: DC
  Administered 2022-08-17 – 2022-08-25 (×9): 10 mg via ORAL
  Filled 2022-08-16 (×9): qty 1

## 2022-08-16 MED ORDER — CALCITRIOL 0.25 MCG PO CAPS
0.2500 ug | ORAL_CAPSULE | Freq: Every day | ORAL | Status: DC
  Administered 2022-08-17 – 2022-08-25 (×9): 0.25 ug via ORAL
  Filled 2022-08-16 (×9): qty 1

## 2022-08-16 MED ORDER — PREDNISONE 20 MG PO TABS
40.0000 mg | ORAL_TABLET | Freq: Every day | ORAL | Status: AC
  Administered 2022-08-17 – 2022-08-21 (×5): 40 mg via ORAL
  Filled 2022-08-16 (×5): qty 2

## 2022-08-16 MED ORDER — ONDANSETRON HCL 4 MG/2ML IJ SOLN: 4.0000 mg | Freq: Four times a day (QID) | INTRAMUSCULAR | Status: DC | PRN

## 2022-08-16 NOTE — ED Notes (Signed)
ED TO INPATIENT HANDOFF REPORT  ED Nurse Name and Phone #: Caryl Pina [phone]  S Name/Age/Gender Pamela [name] 72 y.o. female Room/Bed: WA04/WA04  Code Status   Code Status: Full Code  Home/SNF/Other Home Patient oriented to: self, place, time, and situation Is this baseline? Yes   Triage Complete: Triage complete  Chief Complaint COPD with acute exacerbation ([place]) [J44.1]  Triage Note C/o SOB x3 days with unproductive cough, runny nose, and congestion  Denies cp.  Patient on 2lpm [place] baseline at home but increased to 4lpm due to SOB.    Allergies Allergies  Allergen Reactions   Sulfa Antibiotics Hives and Rash    Level of Care/Admitting Diagnosis ED Disposition     ED Disposition  Admit   Condition  --   Comment  Hospital Area: [place] [[id number]]  Level of Care: Progressive [102]  Admit to Progressive based on following criteria: RESPIRATORY PROBLEMS hypoxemic/hypercapnic respiratory failure that is responsive to NIPPV (BiPAP) or High Flow Nasal Cannula (6-80 lpm). Frequent assessment/intervention, no > Q2 hrs < Q4 hrs, to maintain oxygenation and pulmonary hygiene.  May place patient in observation at [facility] or [place] if equivalent level of care is available:: No  Covid Evaluation: Asymptomatic - no recent exposure (last 10 days) testing not required  Diagnosis: COPD with acute exacerbation [facility]) [[id number]]  Admitting Physician: Reubin Milan [[id number]]  Attending Physician: Reubin Milan [[id number]]          B Medical/Surgery History Past Medical History:  Diagnosis Date   Arthritis    Asthma    Cancer ([place])    lung, adenocarcinoma right lung 2012   CHF (congestive heart failure) ([place])    Preserved EF   Congenital single kidney    With chronic kidney disease   COPD (chronic obstructive pulmonary disease) ([place])    Gout    Hypertension    Lung cancer ([place]) 2012   Right upper lobe lung adenocarcinoma diagnosed  with needle biopsy treated by SBRT finished treatment April 2013 has been monitored since   Mass of chest wall, right    Right chest wall mass 7.3 cm biopsy on 12/13/2013. Patient notes it was consistent with sarcoidosis but actual pathology results not available.   Oxygen deficiency    Sarcoidosis    Sickle cell trait ([place])    Sleep apnea    Past Surgical History:  Procedure Laterality Date   LOOP RECORDER INSERTION N/A 09/08/2018   Procedure: LOOP RECORDER INSERTION;  Surgeon: Evans Lance, MD;  Location: [place] CV LAB;  Service: Cardiovascular;  Laterality: N/A;   LUNG BIOPSY     TEE WITHOUT CARDIOVERSION N/A 09/08/2018   Procedure: TRANSESOPHAGEAL ECHOCARDIOGRAM (TEE);  Surgeon: Josue Hector, MD;  Location: [facility] ENDOSCOPY;  Service: Cardiovascular;  Laterality: N/A;  with loop   TUBAL LIGATION     VEIN LIGATION AND STRIPPING       A IV Location/Drains/Wounds Patient Lines/Drains/Airways Status     Active Line/Drains/Airways     Name Placement date Placement time Site Days   Peripheral IV 08/16/22 20 G Right Antecubital 08/16/22  1530  Antecubital  less than 1            Intake/Output Last 24 hours No intake or output data in the 24 hours ending 08/16/22 1901  Labs/Imaging Results for orders placed or performed during the hospital encounter of 08/16/22 (from the past 48 hour(s))  Comprehensive metabolic panel     Status: Abnormal  Collection Time: 08/16/22 12:32 PM  Result Value Ref Range   Sodium 141 135 - 145 mmol/L   Potassium 3.8 3.5 - 5.1 mmol/L   Chloride 97 (L) 98 - 111 mmol/L   CO2 32 22 - 32 mmol/L   Glucose, Bld 104 (H) 70 - 99 mg/dL    Comment: Glucose reference range applies only to samples taken after fasting for at least 8 hours.   BUN 26 (H) 8 - 23 mg/dL   Creatinine, Ser 1.45 (H) 0.44 - 1.00 mg/dL   Calcium 8.3 (L) 8.9 - 10.3 mg/dL   Total Protein 7.2 6.5 - 8.1 g/dL   Albumin 4.0 3.5 - 5.0 g/dL   AST 16 15 - 41 U/L   ALT 9 0 - 44 U/L    Alkaline Phosphatase 114 38 - 126 U/L   Total Bilirubin 0.5 0.3 - 1.2 mg/dL   GFR, Estimated 39 (L) >60 mL/min    Comment: (NOTE) Calculated using the CKD-EPI Creatinine Equation (2021)    Anion gap 12 5 - 15    Comment: Performed at [facility], [place] [address], [place], [place] [address]  CBC with Differential     Status: Abnormal   Collection Time: 08/16/22 12:32 PM  Result Value Ref Range   WBC 7.3 4.0 - 10.5 K/uL   RBC 5.47 (H) 3.87 - 5.11 MIL/uL   Hemoglobin 12.1 12.0 - 15.0 g/dL   HCT 40.7 36.0 - 46.0 %   MCV 74.4 (L) 80.0 - 100.0 fL   MCH 22.1 (L) 26.0 - 34.0 pg   MCHC 29.7 (L) 30.0 - 36.0 g/dL   RDW 15.1 11.5 - 15.5 %   Platelets 156 150 - 400 K/uL   nRBC 0.0 0.0 - 0.2 %   Neutrophils Relative % 56 %   Neutro Abs 4.1 1.7 - 7.7 K/uL   Lymphocytes Relative 14 %   Lymphs Abs 1.0 0.7 - 4.0 K/uL   Monocytes Relative 6 %   Monocytes Absolute 0.5 0.1 - 1.0 K/uL   Eosinophils Relative 22 %   Eosinophils Absolute 1.6 (H) 0.0 - 0.5 K/uL   Basophils Relative 1 %   Basophils Absolute 0.0 0.0 - 0.1 K/uL   Immature Granulocytes 1 %   Abs Immature Granulocytes 0.04 0.00 - 0.07 K/uL    Comment: Performed at [facility], [place] [address], [place], [place] [address]  Brain natriuretic peptide     Status: None   Collection Time: 08/16/22 12:32 PM  Result Value Ref Range   B Natriuretic Peptide 37.9 0.0 - 100.0 pg/mL    Comment: Performed at [facility], [place] [address] [place], [place] [address]  Troponin I (High Sensitivity)     Status: None   Collection Time: 08/16/22  3:30 PM  Result Value Ref Range   Troponin I (High Sensitivity) 15 <18 ng/L    Comment: (NOTE) Elevated high sensitivity troponin I (hsTnI) values and significant  changes across serial measurements may suggest ACS but many other  chronic and acute conditions are known to elevate hsTnI results.  Refer to the "Links" section for chest pain algorithms and  additional  guidance. Performed at [facility], [place] [address] [place], [place] [address]   Troponin I (High Sensitivity)     Status: None   Collection Time: 08/16/22  4:55 PM  Result Value Ref Range   Troponin I (High Sensitivity) 13 <18 ng/L    Comment: (NOTE) Elevated  high sensitivity troponin I (hsTnI) values and significant  changes across serial measurements may suggest ACS but many other  chronic and acute conditions are known to elevate hsTnI results.  Refer to the "Links" section for chest pain algorithms and additional  guidance. Performed at [facility], [place] [address], [place], [place] [address]    DG Chest 2 View  Result Date: 08/16/2022 CLINICAL DATA:  Shortness of breath EXAM: CHEST - 2 VIEW COMPARISON:  Chest CT April 04, 2022 and chest radiograph April 03, 2022. FINDINGS: Left anterior chest cardiac loop recording device. The heart size and mediastinal contours are unchanged. Similar right upper lobe scarring. Hazy opacities in the lung bases are favored artifactual secondary to technique. No visible pleural effusion or pneumothorax. Thoracic spondylosis. IMPRESSION: No convincing evidence of acute cardiopulmonary disease. Electronically Signed   By: Dahlia Bailiff M.D.   On: 08/16/2022 13:08    Pending Labs Unresulted Labs (From admission, onward)     Start     Ordered   08/23/22 0500  Creatinine, serum  (enoxaparin (LOVENOX)    CrCl >/= 30 ml/min)  Weekly,   R     Comments: while on enoxaparin therapy    08/16/22 1556   08/17/22 0500  Comprehensive metabolic panel  Tomorrow morning,   R        08/16/22 1556   08/17/22 0500  CBC  Tomorrow morning,   R        08/16/22 1556            Vitals/Pain Today's Vitals   08/16/22 1616 08/16/22 1625 08/16/22 1715 08/16/22 1832  BP:  (!) 179/97 (!) 141/111   Pulse:  68 72   Resp:  (!) 24 (!) 23   Temp:    98.2 F (36.8 C)  TempSrc:    Axillary  SpO2: 100% 100%  100%   Weight:      PainSc:        Isolation Precautions No active isolations  Medications Medications  enoxaparin (LOVENOX) injection 65 mg (has no administration in time range)  acetaminophen (TYLENOL) tablet 650 mg (has no administration in time range)    Or  acetaminophen (TYLENOL) suppository 650 mg (has no administration in time range)  ondansetron (ZOFRAN) tablet 4 mg (has no administration in time range)    Or  ondansetron (ZOFRAN) injection 4 mg (has no administration in time range)  albuterol (PROVENTIL) (2.5 MG/3ML) 0.083% nebulizer solution 2.5 mg (has no administration in time range)  ipratropium-albuterol (DUONEB) 0.5-2.5 (3) MG/3ML nebulizer solution 3 mL (3 mLs Nebulization Given 08/16/22 1616)  predniSONE (DELTASONE) tablet 40 mg (has no administration in time range)  aspirin EC tablet 81 mg (has no administration in time range)  calcitRIOL (ROCALTROL) capsule 0.25 mcg (has no administration in time range)  loratadine (CLARITIN) tablet 10 mg (has no administration in time range)  montelukast (SINGULAIR) tablet 10 mg (has no administration in time range)  febuxostat (ULORIC) tablet 40 mg (has no administration in time range)  docusate sodium (COLACE) capsule 100 mg (has no administration in time range)  furosemide (LASIX) tablet 40 mg (has no administration in time range)  ipratropium-albuterol (DUONEB) 0.5-2.5 (3) MG/3ML nebulizer solution 3 mL (3 mLs Nebulization Given 08/16/22 1507)  furosemide (LASIX) injection 60 mg (60 mg Intravenous Given 08/16/22 1533)  methylPREDNISolone sodium succinate (SOLU-MEDROL) 125 mg/2 mL injection 125 mg (125 mg Intravenous Given 08/16/22 1531)    Mobility walks with person assist  Focused Assessments Pulmonary Assessment Handoff:  Lung sounds: Bilateral Breath Sounds: Coarse crackles O2 Device: Bi-PAP      R Recommendations: See Admitting Provider Note  Report given to:   Additional Notes:

## 2022-08-16 NOTE — Progress Notes (Signed)
Overnight   NAME: Pamela [name] MRN: [id number] DOB : 12-26-1950    Date of Service   08/16/2022   HPI/Events of Note   Notified by RN for Hypertension.   Bedside visit completed. RN and I discovered BP malpositioned over clothing and sensor misaligned. RN adjusted and found BP to be in acceptable range for admission history.    Interventions/ Plan   Continue monitoring BP per unit protocol  Malpositioned device - corrected       Gershon Cull BSN MSNA MSN ACNPC-AG Acute Care Nurse Practitioner [place]

## 2022-08-16 NOTE — ED Notes (Signed)
Pt. Pure-wick applied.

## 2022-08-16 NOTE — ED Triage Notes (Signed)
C/o SOB x3 days with unproductive cough, runny nose, and congestion  Denies cp.  Patient on 2lpm [place] baseline at home but increased to 4lpm due to SOB.

## 2022-08-16 NOTE — ED Notes (Signed)
Hospitalist at bedside 

## 2022-08-16 NOTE — ED Notes (Signed)
Pamela Plowman, NP bedside for high BP. Pt BP cuff adjusted and changed. Pt BP updated.

## 2022-08-16 NOTE — H&P (Signed)
History and Physical    Patient: Pamela [name] ZOX:[id number] DOB: 1950-11-10 DOA: 08/16/2022 DOS: the patient was seen and examined on 08/16/2022 PCP: Dorena Dew, FNP  Patient coming from: Home  Chief Complaint:  Chief Complaint  Patient presents with   Shortness of Breath   HPI: Pamela [name] is a 72 y.o. female with medical history significant of osteoarthritis, asthma, lung cancer, chronic diastolic heart failure, congenital single kidney, COPD on home [place] oxygen at 2 LPM, gout, sarcoidosis, sickle cell trait, sleep apnea who presented to the emergency department with complaints of dyspnea, occasionally productive cough, rhinorrhea and congestion for the past 3 days.  She has had to increase her oxygen flow from her baseline to 4 LPM.  No fever, chills or night sweats. No sore throat, rhinorrhea or hemoptysis.  No chest pain, palpitations, diaphoresis, PND, orthopnea or pitting edema of the lower extremities.  No appetite changes, abdominal pain, diarrhea, constipation, melena or hematochezia.  No flank pain, dysuria, frequency or hematuria.  No polyuria, polydipsia, polyphagia or blurred vision.  Lab work: Her CBC is her white count 7.3, hemoglobin 12.1 g/dL and platelets 156.  BNP was normal.  CMP showed chloride 97, glucose 104, BUN 26, creatinine 1.45 and calcium 8.3 mg/dL.  The rest of the CMP measurements were normal.  Imaging: Two-view chest radiograph with no convincing evidence of acute cardiopulmonary disease.  ED course: Initial vital signs were temperature 98.3 F, pulse 79, respirations 20, BP 173/96 mmHg O2 sats 100% on nasal cannula oxygen.  The patient was subsequently placed on BiPAP.  She also was treated with a DuoNeb, furosemide 60 mg IVP and methylprednisolone 125 mg IVP.   Review of Systems: As mentioned in the history of present illness. All other systems reviewed and are negative.  Past Medical History:  Diagnosis Date   Arthritis    Asthma    Cancer  ([place])    lung, adenocarcinoma right lung 2012   CHF (congestive heart failure) (HCC)    Preserved EF   Congenital single kidney    With chronic kidney disease   COPD (chronic obstructive pulmonary disease) ([place])    Gout    Hypertension    Lung cancer ([place]) 2012   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 Patient notes it was consistent with sarcoidosis but actual pathology results not available.   Oxygen deficiency    Sarcoidosis    Sickle cell trait ([place])    Sleep apnea    Past Surgical History:  Procedure Laterality Date   LOOP RECORDER INSERTION N/A 09/08/2018   Procedure: LOOP RECORDER INSERTION;  Surgeon: Evans Lance, MD;  Location: [facility];  Service: Cardiovascular;  Laterality: N/A;   LUNG BIOPSY     TEE WITHOUT CARDIOVERSION N/A 09/08/2018   Procedure: TRANSESOPHAGEAL ECHOCARDIOGRAM (TEE);  Surgeon: Josue Hector, MD;  Location: [facility];  Service: Cardiovascular;  Laterality: N/A;  with loop   TUBAL LIGATION     VEIN LIGATION AND STRIPPING     Social History:  reports that she has never smoked. She has never used smokeless tobacco. She reports that she does not drink alcohol and does not use drugs.  Allergies  Allergen Reactions   Sulfa Antibiotics Hives and Rash    Family History  Problem Relation Age of  Onset   Hypertension Mother    Renal Disease Mother    High blood pressure Mother    Heart disease Mother    Cancer Father        stomach   Heart disease Father        No details   Cervical cancer Sister    Diabetes Sister    Multiple myeloma Sister    Cancer Brother    Diabetes Brother     Prior to Admission medications   Medication Sig Start Date End Date Taking? Authorizing Provider  acetaminophen (TYLENOL) 500 MG tablet Take 1,000 mg by mouth every 6 (six)  hours as needed for mild pain.    [provider]  albuterol (PROAIR HFA) 108 (90 Base) MCG/ACT inhaler Inhale 2 puffs into the lungs every 4 (four) hours as needed for wheezing or shortness of breath. 06/19/22   Dorena Dew, FNP  albuterol (PROVENTIL) (2.5 MG/3ML) 0.083% nebulizer solution USE 1 VIAL IN NEBULIZER EVERY 6 HOURS AS NEEDED FOR WHEEZING FOR SHORTNESS OF BREATH 09/11/21   Dorena Dew, FNP  aspirin EC 81 MG tablet Take 1 tablet (81 mg total) by mouth daily. Swallow whole. 04/26/20   Chandrasekhar, Mahesh A, MD  azithromycin (ZITHROMAX) 250 MG tablet Take 1 tablet (250 mg total) by mouth daily. Patient not taking: Reported on 07/28/2022 07/15/22   Tanda Rockers, MD  calcitRIOL (ROCALTROL) 0.25 MCG capsule Take 1 capsule (0.25 mcg total) by mouth daily. 06/26/22   Dorena Dew, FNP  cetirizine (ZYRTEC) 10 MG chewable tablet Chew 10 mg by mouth daily.    [provider]  docusate sodium (STOOL SOFTENER) 100 MG capsule Take 100 mg by mouth daily.    [provider]  febuxostat (ULORIC) 40 MG tablet Take 1 tablet (40 mg total) by mouth daily. 02/19/22   Dorena Dew, FNP  Ferrous Sulfate (IRON PO) Take 1 tablet by mouth daily.    [provider]  Fluticasone-Umeclidin-Vilant (TRELEGY ELLIPTA) 200-62.5-25 MCG/ACT AEPB Inhale 1 puff into the lungs daily. 04/14/22   Parrett, Fonnie Mu, NP  furosemide (LASIX) 40 MG tablet Take 1 tablet (40 mg total) by mouth daily. 04/11/22   Modena Jansky, MD  Incontinence Supply Disposable (DEPEND UNDERWEAR LARGE/XL) MISC 1 each by Does not apply route 2 times daily at 12 noon and 4 pm. 07/28/22   Dorena Dew, FNP  montelukast (SINGULAIR) 10 MG tablet Take 1 tablet (10 mg total) by mouth at bedtime. 04/14/22   Parrett, Fonnie Mu, NP  Vitamin D, Ergocalciferol, (DRISDOL) 1.25 MG (50000 UNIT) CAPS capsule Take 1 capsule (50,000 Units total) by mouth every 7 (seven) days. Patient not taking: Reported on  07/15/2022 03/10/22   Everardo Pacific, FNP    Physical Exam: Vitals:   08/16/22 1229 08/16/22 1507 08/16/22 1508 08/16/22 1537  BP:    (!) 160/98  Pulse:   75 71  Resp:   (!) 26 (!) 24  Temp:      TempSrc:      SpO2:  98% 100% 100%  Weight: 136.1 kg      Physical Exam Vitals and nursing note reviewed.  Constitutional:      General: She is awake. She is not in acute distress.    Appearance: She is well-developed. She is morbidly obese.     Interventions: Face mask in place.     Comments: BiPAP mask on.  HENT:     Head: Normocephalic.  Nose: No rhinorrhea.     Mouth/Throat:     Mouth: Mucous membranes are moist.  Eyes:     Pupils: Pupils are equal, round, and reactive to light.  Neck:     Vascular: No JVD.  Cardiovascular:     Rate and Rhythm: Normal rate and regular rhythm.     Heart sounds: S1 normal and S2 normal.     Comments: Stage III lymphedema.  No pitting edema. Pulmonary:     Effort: Pulmonary effort is normal. Tachypnea present. No accessory muscle usage.     Breath sounds: Decreased breath sounds, wheezing and rhonchi present. No rales.  Abdominal:     General: Bowel sounds are normal.     Palpations: Abdomen is soft.     Tenderness: There is no abdominal tenderness.  Musculoskeletal:     Cervical back: Neck supple.     Right lower leg: 3+ Edema present.     Left lower leg: 3+ Edema present.  Skin:    General: Skin is warm and dry.  Neurological:     General: No focal deficit present.     Mental Status: She is alert and oriented to person, place, and time.  Psychiatric:        Mood and Affect: Mood normal.        Behavior: Behavior normal. Behavior is cooperative.    Data Reviewed:  There are no new results to review at this time.  09/06/2018 echocardiogram IMPRESSIONS:   1. The left ventricle has hyperdynamic systolic function, with an  ejection fraction of >65%. The cavity size was normal. Left ventricular  diastolic parameters were  normal.   2. The right ventricle has normal systolic function. The cavity was  normal. There is no increase in right ventricular wall thickness.   3. The mitral valve is normal in structure.   4. The tricuspid valve is normal in structure.   5. Aortic valve is probably tricuspid.   6. The interatrial septum is aneurysmal.   Assessment and Plan: Principal Problem:   Acute on chronic respiratory failure with hypoxia and hypercapnia (HCC) In the setting of:   COPD with acute exacerbation (HCC) Observation/telemetry Continue supplemental oxygen. Methylprednisolone 125 mg IVP x1. Followed by prednisone 40 mg p.o. daily in a.m. Scheduled and as needed bronchodilators. Continue BiPAP ventilation as needed and at bedtime. Follow-up CBC and chemistry in the morning.   Active Problems:   Obstructive sleep apnea Continue CPAP at bedtime.    Chronic diastolic CHF (congestive heart failure) (HCC) BMP was normal. Check echocardiogram. Continue furosemide 40 mg p.o. twice daily.    Stage 3b chronic kidney disease ([place]) Monitor renal function electrolytes.    Prediabetes Currently on glucocorticoids. Carbohydrate modified diet. Check CBG before meals and bedtime. Check hemoglobin A1c.    Vitamin D deficiency Continue calcitriol 0.25 mcg daily.     Advance Care Planning:   Code Status: Full Code   Consults:   Family Communication:   Severity of Illness: The appropriate patient status for this patient is OBSERVATION. Observation status is judged to be reasonable and necessary in order to provide the required intensity of service to ensure the patient's safety. The patient's presenting symptoms, physical exam findings, and initial radiographic and laboratory data in the context of their medical condition is felt to place them at decreased risk for further clinical deterioration. Furthermore, it is anticipated that the patient will be medically stable for discharge from the hospital  within 2 midnights of admission.  Author: Reubin Milan, MD 08/16/2022 3:44 PM  For on call review [email or web address].   This document was prepared using Dragon voice recognition software and may contain some unintended transcription errors.

## 2022-08-16 NOTE — ED Provider Notes (Signed)
EMERGENCY DEPARTMENT AT [facility] Provider Note   CSN: [id number] Arrival date & time: 08/16/22  1156     History {Add pertinent medical, surgical, social history, OB history to HPI:1} Chief Complaint  Patient presents with   Shortness of Breath    Pamela [name] is a 72 y.o. female.   Shortness of Breath      Home Medications Prior to Admission medications   Medication Sig Start Date End Date Taking? Authorizing Provider  acetaminophen (TYLENOL) 500 MG tablet Take 1,000 mg by mouth every 6 (six) hours as needed for mild pain.    [provider]  albuterol (PROAIR HFA) 108 (90 Base) MCG/ACT inhaler Inhale 2 puffs into the lungs every 4 (four) hours as needed for wheezing or shortness of breath. 06/19/22   Dorena Dew, FNP  albuterol (PROVENTIL) (2.5 MG/3ML) 0.083% nebulizer solution USE 1 VIAL IN NEBULIZER EVERY 6 HOURS AS NEEDED FOR WHEEZING FOR SHORTNESS OF BREATH 09/11/21   Dorena Dew, FNP  aspirin EC 81 MG tablet Take 1 tablet (81 mg total) by mouth daily. Swallow whole. 04/26/20   Chandrasekhar, Mahesh A, MD  azithromycin (ZITHROMAX) 250 MG tablet Take 1 tablet (250 mg total) by mouth daily. Patient not taking: Reported on 07/28/2022 07/15/22   Tanda Rockers, MD  calcitRIOL (ROCALTROL) 0.25 MCG capsule Take 1 capsule (0.25 mcg total) by mouth daily. 06/26/22   Dorena Dew, FNP  cetirizine (ZYRTEC) 10 MG chewable tablet Chew 10 mg by mouth daily.    [provider]  docusate sodium (STOOL SOFTENER) 100 MG capsule Take 100 mg by mouth daily.    [provider]  febuxostat (ULORIC) 40 MG tablet Take 1 tablet (40 mg total) by mouth daily. 02/19/22   Dorena Dew, FNP  Ferrous Sulfate (IRON PO) Take 1 tablet by mouth daily.    [provider]  Fluticasone-Umeclidin-Vilant (TRELEGY ELLIPTA) 200-62.5-25 MCG/ACT AEPB Inhale 1 puff into the lungs daily. 04/14/22   Parrett, Fonnie Mu, NP  furosemide (LASIX) 40  MG tablet Take 1 tablet (40 mg total) by mouth daily. 04/11/22   Modena Jansky, MD  Incontinence Supply Disposable (DEPEND UNDERWEAR LARGE/XL) MISC 1 each by Does not apply route 2 times daily at 12 noon and 4 pm. 07/28/22   Dorena Dew, FNP  montelukast (SINGULAIR) 10 MG tablet Take 1 tablet (10 mg total) by mouth at bedtime. 04/14/22   Parrett, Fonnie Mu, NP  Vitamin D, Ergocalciferol, (DRISDOL) 1.25 MG (50000 UNIT) CAPS capsule Take 1 capsule (50,000 Units total) by mouth every 7 (seven) days. Patient not taking: Reported on 07/15/2022 03/10/22   Everardo Pacific, FNP      Allergies    Sulfa antibiotics    Review of Systems   Review of Systems  Respiratory:  Positive for shortness of breath.     Physical Exam Updated Vital Signs BP (!) 173/96 (BP Location: Left Arm)   Pulse 75   Temp 98.3 F (36.8 C) (Oral)   Resp (!) 26   Wt 136.1 kg   SpO2 100%   BMI 48.42 kg/m  Physical Exam  ED Results / Procedures / Treatments   Labs (all labs ordered are listed, but only abnormal results are displayed) Labs Reviewed  COMPREHENSIVE METABOLIC PANEL - Abnormal; Notable for the following components:      Result Value   Chloride 97 (*)    Glucose, Bld 104 (*)    BUN 26 (*)  Creatinine, Ser 1.45 (*)    Calcium 8.3 (*)    GFR, Estimated 39 (*)    All other components within normal limits  CBC WITH DIFFERENTIAL/PLATELET - Abnormal; Notable for the following components:   RBC 5.47 (*)    MCV 74.4 (*)    MCH 22.1 (*)    MCHC 29.7 (*)    Eosinophils Absolute 1.6 (*)    All other components within normal limits  BRAIN NATRIURETIC PEPTIDE  TROPONIN I (HIGH SENSITIVITY)    EKG EKG Interpretation  Date/Time:  Sunday August 16 2022 12:26:17 EST Ventricular Rate:  81 PR Interval:  158 QRS Duration: 102 QT Interval:  406 QTC Calculation: 472 R Axis:   25 Text Interpretation: Sinus rhythm Abnormal R-wave progression, late transition Confirmed by Dene Gentry [id number]) on  08/16/2022 2:58:36 PM  Radiology DG Chest 2 View  Result Date: 08/16/2022 CLINICAL DATA:  Shortness of breath EXAM: CHEST - 2 VIEW COMPARISON:  Chest CT April 04, 2022 and chest radiograph April 03, 2022. FINDINGS: Left anterior chest cardiac loop recording device. The heart size and mediastinal contours are unchanged. Similar right upper lobe scarring. Hazy opacities in the lung bases are favored artifactual secondary to technique. No visible pleural effusion or pneumothorax. Thoracic spondylosis. IMPRESSION: No convincing evidence of acute cardiopulmonary disease. Electronically Signed   By: Dahlia Bailiff M.D.   On: 08/16/2022 13:08    Procedures Procedures  {Document cardiac monitor, telemetry assessment procedure when appropriate:1}  Medications Ordered in ED Medications  furosemide (LASIX) injection 60 mg (has no administration in time range)  methylPREDNISolone sodium succinate (SOLU-MEDROL) 125 mg/2 mL injection 125 mg (has no administration in time range)  ipratropium-albuterol (DUONEB) 0.5-2.5 (3) MG/3ML nebulizer solution 3 mL (3 mLs Nebulization Given 08/16/22 1507)    ED Course/ Medical Decision Making/ A&P Clinical Course as of 08/16/22 1515  Sun Aug 16, 2022  1450 Walked into patient's room after she was moved from the wheelchair to the bed and became tahcypneic with accessory muscle use. Prolonged expiratory phase. Question rhonchi versus fluid. Called RT immediately who reassessed and recommended BiPap as well [RR]    Clinical Course User Index [RR] Sherrell Puller, PA-C   {   Click here for ABCD2, HEART and other calculatorsREFRESH Note before signing :1}                          Medical Decision Making Risk Prescription drug management.   ***  {Document critical care time when appropriate:1} {Document review of labs and clinical decision tools ie heart score, Chads2Vasc2 etc:1}  {Document your independent review of radiology images, and any outside  records:1} {Document your discussion with family members, caretakers, and with consultants:1} {Document social determinants of health affecting pt's care:1} {Document your decision making why or why not admission, treatments were needed:1} Final Clinical Impression(s) / ED Diagnoses Final diagnoses:  None    Rx / DC Orders ED Discharge Orders     None

## 2022-08-16 NOTE — ED Provider Notes (Incomplete)
[place] EMERGENCY DEPARTMENT AT [facility] Provider Note   CSN: [id number] Arrival date & time: 08/16/22  1156     History {Add pertinent medical, surgical, social history, OB history to HPI:1} Chief Complaint  Patient presents with   Shortness of Breath    Pamela [name] is a 72 y.o. female.   Shortness of Breath      Home Medications Prior to Admission medications   Medication Sig Start Date End Date Taking? Authorizing Provider  acetaminophen (TYLENOL) 500 MG tablet Take 1,000 mg by mouth every 6 (six) hours as needed for mild pain.    [provider]  albuterol (PROAIR HFA) 108 (90 Base) MCG/ACT inhaler Inhale 2 puffs into the lungs every 4 (four) hours as needed for wheezing or shortness of breath. 06/19/22   Dorena Dew, FNP  albuterol (PROVENTIL) (2.5 MG/3ML) 0.083% nebulizer solution USE 1 VIAL IN NEBULIZER EVERY 6 HOURS AS NEEDED FOR WHEEZING FOR SHORTNESS OF BREATH 09/11/21   Dorena Dew, FNP  aspirin EC 81 MG tablet Take 1 tablet (81 mg total) by mouth daily. Swallow whole. 04/26/20   Chandrasekhar, Mahesh A, MD  azithromycin (ZITHROMAX) 250 MG tablet Take 1 tablet (250 mg total) by mouth daily. Patient not taking: Reported on 07/28/2022 07/15/22   Tanda Rockers, MD  calcitRIOL (ROCALTROL) 0.25 MCG capsule Take 1 capsule (0.25 mcg total) by mouth daily. 06/26/22   Dorena Dew, FNP  cetirizine (ZYRTEC) 10 MG chewable tablet Chew 10 mg by mouth daily.    [provider]  docusate sodium (STOOL SOFTENER) 100 MG capsule Take 100 mg by mouth daily.    [provider]  febuxostat (ULORIC) 40 MG tablet Take 1 tablet (40 mg total) by mouth daily. 02/19/22   Dorena Dew, FNP  Ferrous Sulfate (IRON PO) Take 1 tablet by mouth daily.    [provider]  Fluticasone-Umeclidin-Vilant (TRELEGY ELLIPTA) 200-62.5-25 MCG/ACT AEPB Inhale 1 puff into the lungs daily. 04/14/22   Parrett, Fonnie Mu, NP  furosemide (LASIX) 40  MG tablet Take 1 tablet (40 mg total) by mouth daily. 04/11/22   Modena Jansky, MD  Incontinence Supply Disposable (DEPEND UNDERWEAR LARGE/XL) MISC 1 each by Does not apply route 2 times daily at 12 noon and 4 pm. 07/28/22   Dorena Dew, FNP  montelukast (SINGULAIR) 10 MG tablet Take 1 tablet (10 mg total) by mouth at bedtime. 04/14/22   Parrett, Fonnie Mu, NP  Vitamin D, Ergocalciferol, (DRISDOL) 1.25 MG (50000 UNIT) CAPS capsule Take 1 capsule (50,000 Units total) by mouth every 7 (seven) days. Patient not taking: Reported on 07/15/2022 03/10/22   Everardo Pacific, FNP      Allergies    Sulfa antibiotics    Review of Systems   Review of Systems  Respiratory:  Positive for shortness of breath.     Physical Exam Updated Vital Signs BP (!) 173/96 (BP Location: Left Arm)   Pulse 79   Temp 98.3 F (36.8 C) (Oral)   Resp (!) 23   Wt 136.1 kg   SpO2 100%   BMI 48.42 kg/m  Physical Exam  ED Results / Procedures / Treatments   Labs (all labs ordered are listed, but only abnormal results are displayed) Labs Reviewed  COMPREHENSIVE METABOLIC PANEL - Abnormal; Notable for the following components:      Result Value   Chloride 97 (*)    Glucose, Bld 104 (*)    BUN 26 (*)  Creatinine, Ser 1.45 (*)    Calcium 8.3 (*)    GFR, Estimated 39 (*)    All other components within normal limits  CBC WITH DIFFERENTIAL/PLATELET - Abnormal; Notable for the following components:   RBC 5.47 (*)    MCV 74.4 (*)    MCH 22.1 (*)    MCHC 29.7 (*)    Eosinophils Absolute 1.6 (*)    All other components within normal limits  BRAIN NATRIURETIC PEPTIDE    EKG None  Radiology DG Chest 2 View  Result Date: 08/16/2022 CLINICAL DATA:  Shortness of breath EXAM: CHEST - 2 VIEW COMPARISON:  Chest CT April 04, 2022 and chest radiograph April 03, 2022. FINDINGS: Left anterior chest cardiac loop recording device. The heart size and mediastinal contours are unchanged. Similar right upper  lobe scarring. Hazy opacities in the lung bases are favored artifactual secondary to technique. No visible pleural effusion or pneumothorax. Thoracic spondylosis. IMPRESSION: No convincing evidence of acute cardiopulmonary disease. Electronically Signed   By: Dahlia Bailiff M.D.   On: 08/16/2022 13:08    Procedures Procedures  {Document cardiac monitor, telemetry assessment procedure when appropriate:1}  Medications Ordered in ED Medications  ipratropium-albuterol (DUONEB) 0.5-2.5 (3) MG/3ML nebulizer solution 3 mL (has no administration in time range)    ED Course/ Medical Decision Making/ A&P   {   Click here for ABCD2, HEART and other calculatorsREFRESH Note before signing :1}                          Medical Decision Making Risk Prescription drug management.   ***  {Document critical care time when appropriate:1} {Document review of labs and clinical decision tools ie heart score, Chads2Vasc2 etc:1}  {Document your independent review of radiology images, and any outside records:1} {Document your discussion with family members, caretakers, and with consultants:1} {Document social determinants of health affecting pt's care:1} {Document your decision making why or why not admission, treatments were needed:1} Final Clinical Impression(s) / ED Diagnoses Final diagnoses:  None    Rx / DC Orders ED Discharge Orders     None

## 2022-08-16 NOTE — ED Provider Triage Note (Signed)
Emergency Medicine Provider Triage Evaluation Note  Pamela [name] , a 72 y.o. female  was evaluated in triage.  Pt complains of shortness of breath.  Patient has a prior history of asthma, COPD, sarcoidosis and lung cancer.  Patient reports that she began noticing shortness of breath about 3 days ago and has progressively worsening.  Patient was previously on 2 L of oxygen at home has not had up to 4 L of oxygen.  Patient denies any cold-like symptoms prior to symptom onset.  Patient does also have a prior history of congestive heart failure.  Patient is taking her medications as prescribed at this time.  Denies fever, chest pain, nausea, vomiting, diarrhea.  Review of Systems  Positive: As above Negative: As above  Physical Exam  BP (!) 173/96 (BP Location: Left Arm)   Pulse 79   Temp 98.3 F (36.8 C) (Oral)   Resp (!) 23   Wt 136.1 kg   SpO2 100%   BMI 48.42 kg/m  Gen:   Awake, no distress Resp:  Increased work of breathing, diffuse wheezing in all lung fields MSK:   Moves extremities without difficulty  Other:    Medical Decision Making  Medically screening exam initiated at 12:31 PM.  Appropriate orders placed.  Kinslea Frances was informed that the remainder of the evaluation will be completed by another provider, this initial triage assessment does not replace that evaluation, and the importance of remaining in the ED until their evaluation is complete.     Luvenia Heller, PA-C 08/16/22 1233

## 2022-08-16 NOTE — ED Notes (Signed)
RT at bedside to place pt on BIPAP

## 2022-08-17 ENCOUNTER — Inpatient Hospital Stay (HOSPITAL_COMMUNITY): Payer: Medicare HMO

## 2022-08-17 DIAGNOSIS — Z7982 Long term (current) use of aspirin: Secondary | ICD-10-CM | POA: Diagnosis not present

## 2022-08-17 DIAGNOSIS — I5032 Chronic diastolic (congestive) heart failure: Secondary | ICD-10-CM

## 2022-08-17 DIAGNOSIS — Z833 Family history of diabetes mellitus: Secondary | ICD-10-CM | POA: Diagnosis not present

## 2022-08-17 DIAGNOSIS — Z8249 Family history of ischemic heart disease and other diseases of the circulatory system: Secondary | ICD-10-CM | POA: Diagnosis not present

## 2022-08-17 DIAGNOSIS — J9602 Acute respiratory failure with hypercapnia: Secondary | ICD-10-CM | POA: Diagnosis present

## 2022-08-17 DIAGNOSIS — Z6841 Body Mass Index (BMI) 40.0 and over, adult: Secondary | ICD-10-CM | POA: Diagnosis not present

## 2022-08-17 DIAGNOSIS — Z807 Family history of other malignant neoplasms of lymphoid, hematopoietic and related tissues: Secondary | ICD-10-CM | POA: Diagnosis not present

## 2022-08-17 DIAGNOSIS — J9621 Acute and chronic respiratory failure with hypoxia: Secondary | ICD-10-CM | POA: Diagnosis present

## 2022-08-17 DIAGNOSIS — E875 Hyperkalemia: Secondary | ICD-10-CM | POA: Diagnosis not present

## 2022-08-17 DIAGNOSIS — Z85118 Personal history of other malignant neoplasm of bronchus and lung: Secondary | ICD-10-CM | POA: Diagnosis not present

## 2022-08-17 DIAGNOSIS — Z79899 Other long term (current) drug therapy: Secondary | ICD-10-CM | POA: Diagnosis not present

## 2022-08-17 DIAGNOSIS — Z882 Allergy status to sulfonamides status: Secondary | ICD-10-CM | POA: Diagnosis not present

## 2022-08-17 DIAGNOSIS — J9622 Acute and chronic respiratory failure with hypercapnia: Secondary | ICD-10-CM | POA: Diagnosis present

## 2022-08-17 DIAGNOSIS — M109 Gout, unspecified: Secondary | ICD-10-CM | POA: Diagnosis present

## 2022-08-17 DIAGNOSIS — Z841 Family history of disorders of kidney and ureter: Secondary | ICD-10-CM | POA: Diagnosis not present

## 2022-08-17 DIAGNOSIS — R7303 Prediabetes: Secondary | ICD-10-CM | POA: Diagnosis present

## 2022-08-17 DIAGNOSIS — J441 Chronic obstructive pulmonary disease with (acute) exacerbation: Secondary | ICD-10-CM | POA: Diagnosis present

## 2022-08-17 DIAGNOSIS — I130 Hypertensive heart and chronic kidney disease with heart failure and stage 1 through stage 4 chronic kidney disease, or unspecified chronic kidney disease: Secondary | ICD-10-CM | POA: Diagnosis present

## 2022-08-17 DIAGNOSIS — I509 Heart failure, unspecified: Secondary | ICD-10-CM | POA: Diagnosis not present

## 2022-08-17 DIAGNOSIS — G4733 Obstructive sleep apnea (adult) (pediatric): Secondary | ICD-10-CM | POA: Diagnosis present

## 2022-08-17 DIAGNOSIS — N1832 Chronic kidney disease, stage 3b: Secondary | ICD-10-CM

## 2022-08-17 DIAGNOSIS — Z8049 Family history of malignant neoplasm of other genital organs: Secondary | ICD-10-CM | POA: Diagnosis not present

## 2022-08-17 DIAGNOSIS — Z9981 Dependence on supplemental oxygen: Secondary | ICD-10-CM | POA: Diagnosis not present

## 2022-08-17 LAB — CBC
HCT: 40.5 % (ref 36.0–46.0)
Hemoglobin: 11.9 g/dL — ABNORMAL LOW (ref 12.0–15.0)
MCH: 22.1 pg — ABNORMAL LOW (ref 26.0–34.0)
MCHC: 29.4 g/dL — ABNORMAL LOW (ref 30.0–36.0)
MCV: 75.3 fL — ABNORMAL LOW (ref 80.0–100.0)
Platelets: 143 10*3/uL — ABNORMAL LOW (ref 150–400)
RBC: 5.38 MIL/uL — ABNORMAL HIGH (ref 3.87–5.11)
RDW: 15.5 % (ref 11.5–15.5)
WBC: 4.3 10*3/uL (ref 4.0–10.5)
nRBC: 0 % (ref 0.0–0.2)

## 2022-08-17 LAB — COMPREHENSIVE METABOLIC PANEL
ALT: 9 U/L (ref 0–44)
AST: 15 U/L (ref 15–41)
Albumin: 3.6 g/dL (ref 3.5–5.0)
Alkaline Phosphatase: 104 U/L (ref 38–126)
Anion gap: 12 (ref 5–15)
BUN: 23 mg/dL (ref 8–23)
CO2: 33 mmol/L — ABNORMAL HIGH (ref 22–32)
Calcium: 8.1 mg/dL — ABNORMAL LOW (ref 8.9–10.3)
Chloride: 96 mmol/L — ABNORMAL LOW (ref 98–111)
Creatinine, Ser: 1.39 mg/dL — ABNORMAL HIGH (ref 0.44–1.00)
GFR, Estimated: 41 mL/min — ABNORMAL LOW (ref >60)
Glucose, Bld: 130 mg/dL — ABNORMAL HIGH (ref 70–99)
Potassium: 4.4 mmol/L (ref 3.5–5.1)
Sodium: 141 mmol/L (ref 135–145)
Total Bilirubin: 0.3 mg/dL (ref 0.3–1.2)
Total Protein: 6.8 g/dL (ref 6.5–8.1)

## 2022-08-17 LAB — ECHOCARDIOGRAM COMPLETE
AR max vel: 2.52 cm2
AV Area VTI: 3.26 cm2
AV Area mean vel: 2.85 cm2
AV Mean grad: 2 mmHg
AV Peak grad: 4.4 mmHg
Ao pk vel: 1.05 m/s
Area-P 1/2: 3.91 cm2
Height: 66 in
S' Lateral: 3 cm
Weight: 4899.5 oz

## 2022-08-17 LAB — GLUCOSE, CAPILLARY
Glucose-Capillary: 124 mg/dL — ABNORMAL HIGH (ref 70–99)
Glucose-Capillary: 135 mg/dL — ABNORMAL HIGH (ref 70–99)
Glucose-Capillary: 123 mg/dL — ABNORMAL HIGH (ref 70–99)
Glucose-Capillary: 146 mg/dL — ABNORMAL HIGH (ref 70–99)

## 2022-08-17 LAB — HEMOGLOBIN A1C
Hgb A1c MFr Bld: 5.4 % (ref 4.8–5.6)
Mean Plasma Glucose: 108.28 mg/dL

## 2022-08-17 MED ORDER — ALBUTEROL SULFATE (2.5 MG/3ML) 0.083% IN NEBU: 2.5000 mg | INHALATION_SOLUTION | RESPIRATORY_TRACT | Status: DC

## 2022-08-17 MED ORDER — ORAL CARE MOUTH RINSE
15.0000 mL | OROMUCOSAL | Status: DC
  Administered 2022-08-17 – 2022-08-25 (×26): 15 mL via OROMUCOSAL

## 2022-08-17 MED ORDER — ALBUTEROL SULFATE (2.5 MG/3ML) 0.083% IN NEBU: 2.5000 mg | INHALATION_SOLUTION | RESPIRATORY_TRACT | Status: DC | PRN

## 2022-08-17 MED ORDER — ORAL CARE MOUTH RINSE: 15.0000 mL | OROMUCOSAL | Status: DC | PRN

## 2022-08-17 MED ORDER — PERFLUTREN LIPID MICROSPHERE
1.0000 mL | INTRAVENOUS | Status: AC | PRN
  Administered 2022-08-17: 3 mL via INTRAVENOUS

## 2022-08-17 MED ORDER — FLUTICASONE FUROATE-VILANTEROL 200-25 MCG/ACT IN AEPB
1.0000 | INHALATION_SPRAY | Freq: Every day | RESPIRATORY_TRACT | Status: DC
  Administered 2022-08-17 – 2022-08-25 (×9): 1 via RESPIRATORY_TRACT
  Filled 2022-08-17: qty 28

## 2022-08-17 MED ORDER — DOXYCYCLINE HYCLATE 100 MG PO TABS
100.0000 mg | ORAL_TABLET | Freq: Two times a day (BID) | ORAL | Status: DC
  Administered 2022-08-17 – 2022-08-25 (×17): 100 mg via ORAL
  Filled 2022-08-17 (×17): qty 1

## 2022-08-17 MED ORDER — GUAIFENESIN-DM 100-10 MG/5ML PO SYRP
5.0000 mL | ORAL_SOLUTION | ORAL | Status: DC | PRN
  Administered 2022-08-17 – 2022-08-22 (×6): 5 mL via ORAL
  Filled 2022-08-17 (×6): qty 10

## 2022-08-17 MED ORDER — UMECLIDINIUM BROMIDE 62.5 MCG/ACT IN AEPB
1.0000 | INHALATION_SPRAY | Freq: Every day | RESPIRATORY_TRACT | Status: DC
  Administered 2022-08-17 – 2022-08-25 (×9): 1 via RESPIRATORY_TRACT
  Filled 2022-08-17 (×2): qty 7

## 2022-08-17 NOTE — TOC Initial Note (Signed)
Transition of Care [facility]) - Initial/Assessment Note    Patient Details  Name: Pamela [name] MRN: [id number] Date of Birth: May 05, 1951  Transition of Care [facility]) CM/SW Contact:    Dessa Phi, RN Phone Number: 08/17/2022, 3:52 PM  Clinical Narrative: Patient already active w/[facility] for HHPT rep aware;[facility] for home 02;has travel tank.HHPT/OT @ d/c.Has own transport home.                  Expected Discharge Plan: [place] Barriers to Discharge: Continued Medical Work up   Patient Goals and CMS Choice Patient states their goals for this hospitalization and ongoing recovery are::  (Home)   Choice offered to / list presented to : Patient [place] ownership interest in [facility].provided to:: Patient    Expected Discharge Plan and Services     Post Acute Care Choice: [place] arrangements for the past 2 months: [place]                                      Prior Living Arrangements/Services Living arrangements for the past 2 months: Single Family Home Lives with:: Adult Children                   Activities of Daily Living Home Assistive Devices/Equipment: Cane (specify quad or straight), Oxygen ADL Screening (condition at time of admission) Patient's cognitive ability adequate to safely complete daily activities?: Yes Is the patient deaf or have difficulty hearing?: No Does the patient have difficulty seeing, even when wearing glasses/contacts?: No Does the patient have difficulty concentrating, remembering, or making decisions?: No Patient able to express need for assistance with ADLs?: Yes Does the patient have difficulty dressing or bathing?: No Independently performs ADLs?: Yes (appropriate for developmental age) Does the patient have difficulty walking or climbing stairs?: Yes Weakness of Legs: None Weakness of Arms/Hands: Left  Permission Sought/Granted                  Emotional  Assessment              Admission diagnosis:  COPD with acute exacerbation ([place]) [J44.1] Acute respiratory failure with hypercapnia ([place]) [J96.02] Patient Active Problem List   Diagnosis Date Noted   Acute respiratory failure with hypercapnia ([place]) 08/17/2022   COPD exacerbation ([place]) 04/04/2022   Vitamin D deficiency 02/02/2022   Stage 3b chronic kidney disease ([place]) 09/24/2020   Malignant neoplasm of lung ([place]) 09/24/2020   Aortic atherosclerosis ([place]) 09/24/2020   PFO (patent foramen ovale) 09/24/2020   History of stroke 09/24/2020   Acute respiratory failure with hypoxia ([place]) 12/17/2019   COPD with acute exacerbation ([place]) 12/17/2019   COVID-19 virus infection 01/02/2019   Right knee pain 09/15/2018   Closed head injury with concussion 09/06/2018   Hypokalemia 09/06/2018   Hypomagnesemia 09/06/2018   Asthma, chronic obstructive, with acute exacerbation ([place]) 08/02/2018   Plantar fasciitis 04/14/2017   Microcytosis 11/28/2015   Solitary kidney 07/18/2015   Onychomycosis 01/09/2015   Ingrown nail 01/09/2015   Pain in lower limb 01/09/2015   Chronic respiratory failure with hypoxia and hypercapnia ([place]) 07/19/2014   Acute on chronic respiratory failure with hypoxia and hypercapnia ([place]) 06/24/2014   Anemia, iron deficiency 05/27/2014   Acute gouty arthritis 05/25/2014   Obstructive sleep apnea 05/25/2014   Joint pain 05/24/2014   Chronic diastolic CHF (congestive heart failure) ([place])  Sarcoidosis    Metabolic syndrome 28/78/6767   Asthma, chronic 04/27/2014   Anemia of chronic disease 04/19/2014   Morbid obesity ([place]) 04/18/2014   Immunization due 04/18/2014   Need for prophylactic vaccination and inoculation against influenza 04/18/2014   Prediabetes 04/13/2014   Essential hypertension 04/13/2014   Lower extremity edema 04/13/2014   History of lung cancer 04/13/2014   PCP:  Dorena Dew, FNP Pharmacy:   [facility] ([place]), [place] - [address] [place] ([place]) [place] [address] Phone: [phone] Fax: [phone]     Social Determinants of Health (SDOH) Social History: SDOH Screenings   Food Insecurity: No Food Insecurity (08/17/2022)  Housing: Low Risk  (08/17/2022)  Transportation Needs: No Transportation Needs (08/17/2022)  Utilities: Not At Risk (08/17/2022)  Alcohol Screen: Low Risk  (07/15/2022)  Depression (PHQ2-9): Low Risk  (07/28/2022)  Financial Resource Strain: Low Risk  (07/15/2022)  Physical Activity: Inactive (07/15/2022)  Social Connections: Moderately Integrated (07/15/2022)  Stress: No Stress Concern Present (07/15/2022)  Tobacco Use: Low Risk  (08/16/2022)   SDOH Interventions:     Readmission Risk Interventions    04/06/2022   12:12 PM  Readmission Risk Prevention Plan  Transportation Screening Complete  PCP or Specialist Appt within 5-7 Days Complete  Home Care Screening Complete  Medication Review (RN CM) Complete

## 2022-08-17 NOTE — Plan of Care (Signed)

## 2022-08-17 NOTE — Progress Notes (Signed)
  Echocardiogram 2D Echocardiogram has been performed.  Pamela [name] 08/17/2022, 3:46 PM

## 2022-08-17 NOTE — Progress Notes (Signed)
[facility] PROGRESS NOTE    Progress Note  Pamela [name]  ZOX:[id number] DOB: 1950/11/09 DOA: 08/16/2022 PCP: Dorena Dew, FNP     Brief Narrative:   Pamela [name] is an 72 y.o. female past medical history significant for osteoarthritis, lung cancer in remission, chronic diastolic heart failure congenital single kidney, COPD on 2 L of oxygen at home, sarcoid comes in with dyspnea productive cough that started 3 days prior to admission, with an increase need and oxygen requirement, white count of 7.3 hemoglobin of 12 unremarkable BMP chest x-ray showed no acute cardiopulmonary disease    Assessment/Plan:   Acute respiratory failure with hypoxia and hypercarbia likely due to COPD exacerbation: Continue supplemental oxygen agree with IV steroids, inhalers and antibiotics. She has remained afebrile, BNP is unremarkable. Placed on albuterol scheduled resume home dose increase. Good air movement and wheezing bilaterally with rhonchi on physical exam.  Obstructive sleep apnea: Continue CPAP at night.  Chronic diastolic CHF (congestive heart failure) (HCC) BN P unremarkable, 2D echo results are pending. Continue Lasix twice a day.  Chronic kidney disease stage IIIb: Creatinine at baseline continue to monitor.  Prediabetes mellitus: Her blood glucose can become erratic due to steroids. A1c is pending, her blood glucose was stable continue to monitor closely.  History of lung cancer: Non-small cell cancer status post SBRT in 2012 in remission.  DVT prophylaxis: lovenox Family Communication:none Status is: Observation The patient will require care spanning > 2 midnights and should be moved to inpatient because: Acute respiratory failure with hypoxia    Code Status:     Code Status Orders  (From admission, onward)           Start     Ordered   08/16/22 1551  Full code  Continuous       Question:  By:  Answer:  Consent: discussion documented in EHR    08/16/22 1556           Code Status History     Date Active Date Inactive Code Status Order ID Comments User Context   04/04/2022 0350 04/09/2022 0315 Full Code [id number]  Etta Quill, DO ED   12/17/2019 1525 12/21/2019 0503 Full Code [id number]  Isaac Bliss, Rayford Halsted, MD ED   07/27/2019 2226 08/02/2019 1950 Full Code [id number]  Phillips Grout, MD ED   01/02/2019 1531 01/05/2019 1748 Full Code [id number]  Karmen Bongo, MD ED   09/06/2018 1101 09/09/2018 2103 Full Code [id number]  Reubin Milan, MD ED   09/06/2018 1101 09/06/2018 1101 Full Code [id number]  Reubin Milan, MD ED   06/12/2014 0047 06/15/2014 1536 Full Code [id number]  Waldemar Dickens, MD ED   05/24/2014 0546 05/28/2014 1750 Full Code [id number]  Ivor Costa, MD Inpatient         IV Access:   Peripheral IV   Procedures and diagnostic studies:   DG Chest 2 View  Result Date: 08/16/2022 CLINICAL DATA:  Shortness of breath EXAM: CHEST - 2 VIEW COMPARISON:  Chest CT April 04, 2022 and chest radiograph April 03, 2022. FINDINGS: Left anterior chest cardiac loop recording device. The heart size and mediastinal contours are unchanged. Similar right upper lobe scarring. Hazy opacities in the lung bases are favored artifactual secondary to technique. No visible pleural effusion or pneumothorax. Thoracic spondylosis. IMPRESSION: No convincing evidence of acute cardiopulmonary disease. Electronically Signed   By: Dahlia Bailiff M.D.   On: 08/16/2022 13:08     Medical Consultants:   None.  Subjective:    Pamela [name] relates her breathing is about the same  Objective:    Vitals:   08/16/22 2202 08/16/22 2209 08/16/22 2304 08/17/22 0312  BP:  (!) 144/70 135/64 126/82  Pulse:  81 79 82  Resp:  18 19   Temp: 98.2 F (36.8 C)  97.6 F (36.4 C) 97.9 F (36.6 C)  TempSrc: Oral  Oral Oral  SpO2:  100% 100% 100%  Weight:   (!) 138.9 kg   Height:   5\' 6"  (1.676 m)    SpO2: 100 % FiO2 (%): 40  %   Intake/Output Summary (Last 24 hours) at 08/17/2022 0539 Last data filed at 08/17/2022 0300 Gross per 24 hour  Intake --  Output 300 ml  Net -300 ml   Filed Weights   08/16/22 1229 08/16/22 2304  Weight: 136.1 kg (!) 138.9 kg    Exam: General exam: In no acute distress. Respiratory system: Good air movement and rhonchi in with bilaterally. Cardiovascular system: S1 & S2 heard, RRR. No JVD. Gastrointestinal system: Abdomen is nondistended, soft and nontender.  Extremities: No pedal edema. Skin: No rashes, lesions or ulcers Psychiatry: Judgement and insight appear normal. Mood & affect appropriate.    Data Reviewed:    Labs: Basic Metabolic Panel: Recent Labs  Lab 08/16/22 1232 08/17/22 0449  NA 141 141  K 3.8 4.4  CL 97* 96*  CO2 32 33*  GLUCOSE 104* 130*  BUN 26* 23  CREATININE 1.45* 1.39*  CALCIUM 8.3* 8.1*   GFR Estimated Creatinine Clearance: 54.9 mL/min (A) (by C-G formula based on SCr of 1.39 mg/dL (H)). Liver Function Tests: Recent Labs  Lab 08/16/22 1232 08/17/22 0449  AST 16 15  ALT 9 9  ALKPHOS 114 104  BILITOT 0.5 0.3  PROT 7.2 6.8  ALBUMIN 4.0 3.6   No results for input(s): "LIPASE", "AMYLASE" in the last 168 hours. No results for input(s): "AMMONIA" in the last 168 hours. Coagulation profile No results for input(s): "INR", "PROTIME" in the last 168 hours. COVID-19 Labs  No results for input(s): "DDIMER", "FERRITIN", "LDH", "CRP" in the last 72 hours.  Lab Results  Component Value Date   SARSCOV2NAA NEGATIVE 04/03/2022   SARSCOV2NAA NEGATIVE 12/17/2019   [place] NEGATIVE 08/01/2019   [place] NEGATIVE 07/27/2019    CBC: Recent Labs  Lab 08/16/22 1232 08/17/22 0449  WBC 7.3 4.3  NEUTROABS 4.1  --   HGB 12.1 11.9*  HCT 40.7 40.5  MCV 74.4* 75.3*  PLT 156 143*   Cardiac Enzymes: No results for input(s): "CKTOTAL", "CKMB", "CKMBINDEX", "TROPONINI" in the last 168 hours. BNP (last 3 results) No results for  input(s): "PROBNP" in the last 8760 hours. CBG: No results for input(s): "GLUCAP" in the last 168 hours. D-Dimer: No results for input(s): "DDIMER" in the last 72 hours. Hgb A1c: No results for input(s): "HGBA1C" in the last 72 hours. Lipid Profile: No results for input(s): "CHOL", "HDL", "LDLCALC", "TRIG", "CHOLHDL", "LDLDIRECT" in the last 72 hours. Thyroid function studies: No results for input(s): "TSH", "T4TOTAL", "T3FREE", "THYROIDAB" in the last 72 hours.  Invalid input(s): "FREET3" Anemia work up: No results for input(s): "VITAMINB12", "FOLATE", "FERRITIN", "TIBC", "IRON", "RETICCTPCT" in the last 72 hours. Sepsis Labs: Recent Labs  Lab 08/16/22 1232 08/17/22 0449  WBC 7.3 4.3   Microbiology No results found for this or any previous visit (from the past 240 hour(s)).   Medications:    aspirin EC  81 mg Oral Daily   calcitRIOL  0.25 mcg Oral Daily   enoxaparin (LOVENOX) injection  65 mg Subcutaneous QHS   febuxostat  40 mg Oral Daily   furosemide  40 mg Oral Daily   ipratropium-albuterol  3 mL Nebulization QID   loratadine  10 mg Oral Daily   montelukast  10 mg Oral QHS   mouth rinse  15 mL Mouth Rinse 4 times per day   predniSONE  40 mg Oral Q breakfast   Continuous Infusions:    LOS: 0 days   Charlynne Cousins  [facility]  08/17/2022, 7:02 AM

## 2022-08-17 NOTE — Evaluation (Signed)
Occupational Therapy Evaluation Patient Details Name: Pamela [name] MRN: [id number] DOB: 1950/11/22 Today's Date: 08/17/2022   History of Present Illness Pamela [name] is a 72 y.o. female admitted with COPD with acute exacerbation. PMH: osteoarthritis, asthma, lung cancer, CHF, congenital single kidney, COPD on home [place] oxygen at 2 LPM, gout, sarcoidosis, sickle cell trait, sleep apnea   Clinical Impression   Patient evaluated by Occupational Therapy with no further acute OT needs identified. All education has been completed and the patient has no further questions. Patient endorsed being at baseline for ADLs at this time with patient MI for toileting tasks at cane level.  See below for any follow-up Occupational Therapy or equipment needs. OT is signing off. Thank you for this referral.       Recommendations for follow up therapy are one component of a multi-disciplinary discharge planning process, led by the attending physician.  Recommendations may be updated based on patient status, additional functional criteria and insurance authorization.   Follow Up Recommendations  No OT follow up     Assistance Recommended at Discharge PRN  Patient can return home with the following Assistance with cooking/housework    Functional Status Assessment  Patient has not had a recent decline in their functional status        Precautions / Restrictions Precautions Precautions: Fall Precaution Comments: monitor O2 Restrictions Weight Bearing Restrictions: No      Mobility Bed Mobility               General bed mobility comments: patient was up in recliner and returned to the same at end of session    Transfers Overall transfer level: Modified independent        Balance Overall balance assessment: Mild deficits observed, not formally tested           ADL either performed or assessed with clinical judgement   ADL           General ADL Comments: patient is at baseline  for ADL tasks. patient has a reacher and sock aid at home for ADL tasks. patient was able to don L sok with MI seated in reclienr and min A for R sock with inability to bring to lap in figure four positoning.     Vision Baseline Vision/History: 1 Wears glasses Patient Visual Report: No change from baseline              Pertinent Vitals/Pain Pain Assessment Pain Assessment: No/denies pain     Hand Dominance Left   Extremity/Trunk Assessment Upper Extremity Assessment Upper Extremity Assessment: Overall WFL for tasks assessed   Lower Extremity Assessment Lower Extremity Assessment: Defer to PT evaluation   Cervical / Trunk Assessment Cervical / Trunk Assessment: Normal   Communication Communication Communication: No difficulties   Cognition Arousal/Alertness: Awake/alert Behavior During Therapy: WFL for tasks assessed/performed Overall Cognitive Status: Within Functional Limits for tasks assessed                        Home Living Family/patient expects to be discharged to:: Private residence Living Arrangements: Children Available Help at Discharge: Available 24 hours/day Type of Home: House Home Access: Stairs to enter [employer] of Steps: 1 Entrance Stairs-Rails: Right;Left Home Layout: Able to live on main level with bedroom/bathroom     Bathroom Shower/Tub: Hospital doctor Toilet: Handicapped height     Home Equipment: [place] - single Barista (2 wheels);Rollator (4 wheels)   Additional Comments: son  and family live on second floor, goes up 16 steps to do laundry 1 x week,  grand kids take  it up and down steps for  her      Prior Functioning/Environment Prior Level of Function : Independent/Modified Independent             Mobility Comments: pt using SPC in the home or rollator in the home depending on fatigue, ind with in/out of bed and lift chair ADLs Comments: pt reports ind with self care, shares chores  with son        OT Problem List:        OT Treatment/Interventions:      OT Goals(Current goals can be found in the care plan section) Acute Rehab OT Goals OT Goal Formulation: All assessment and education complete, DC therapy  OT Frequency:         AM-PAC OT "6 Clicks" Daily Activity     Outcome Measure Help from another person eating meals?: None Help from another person taking care of personal grooming?: None Help from another person toileting, which includes using toliet, bedpan, or urinal?: None Help from another person bathing (including washing, rinsing, drying)?: A Little Help from another person to put on and taking off regular upper body clothing?: None Help from another person to put on and taking off regular lower body clothing?: A Little 6 Click Score: 22   End of Session Equipment Utilized During Treatment: Other (comment);Oxygen (personal cane) Nurse Communication: Mobility status  Activity Tolerance: Patient tolerated treatment well Patient left: in chair;with call bell/phone within reach  OT Visit Diagnosis: Unsteadiness on feet (R26.81)                Time: 1430-1446 OT Time Calculation (min): 16 min Charges:  OT General Charges $OT Visit: 1 Visit OT Evaluation $OT Eval Low Complexity: 1 Low  Chalene Treu OTR/L, MS Acute Rehabilitation Department Office# [phone]   Willa Rough 08/17/2022, 4:40 PM

## 2022-08-17 NOTE — Progress Notes (Signed)
Nutrition Brief Note  RD consulted for nutritional assessment via COPD protocol.  Per patient at bedside, she has a good appetite and eats 3 meals a day most days. Pt has been trying to lose weight. She knows to not skip meals and include protein foods with every meal and snack. Ate ~75% of breakfast meal.  Helped pt identify heart healthy items on menu.   Has had weight loss since 07/15/22. This was intentional per patient.   Wt Readings from Last 15 Encounters:  08/16/22 (!) 138.9 kg  07/28/22 (!) 146.2 kg  07/15/22 (!) 156.5 kg  05/22/22 (!) 155.1 kg  04/21/22 (!) 154.2 kg  04/14/22 (!) 154.2 kg  04/03/22 (!) 149.9 kg  04/03/22 (!) 149.9 kg  03/24/22 (!) 150.6 kg  03/13/22 (!) 154.6 kg  03/10/22 (!) 152.4 kg  02/12/22 (!) 152 kg  02/03/22 (!) 155.9 kg  01/29/22 (!) 156 kg  12/29/21 (!) 160.8 kg    Body mass index is 49.42 kg/m. Patient meets criteria for morbid obesity based on current BMI.   Current diet order is Heart Healthy/CHO modified , patient is consuming approximately 75% of meals at this time. Labs and medications reviewed.   No nutrition interventions warranted at this time. If nutrition issues arise, please consult RD.   Clayton Bibles, MS, RD, LDN Inpatient Clinical Dietitian Contact information available via Amion

## 2022-08-17 NOTE — Evaluation (Addendum)
Physical Therapy Evaluation Patient Details Name: Pamela [name] MRN: [id number] DOB: 1950/12/11 Today's Date: 08/17/2022  History of Present Illness  Pamela [name] is a 72 y.o. female admitted with COPD with acute exacerbation. PMH: osteoarthritis, asthma, lung cancer, CHF, congenital single kidney, COPD on home [place] oxygen at 2 LPM, gout, sarcoidosis, sickle cell trait, sleep apnea  Clinical Impression  Pt admitted with above diagnosis. Pt lives at home with son, lives on main level using [facility] or rollator in the home depending on fatigue level, able to go to 2nd floor to complete laundry at baseline. Pt currently supv-min guard with mobility using SPC, therapist managing O2 tank and O2 tubing, pt desats on 4L O2 to 86% with ambulation and coughing and able to improve to 91% with standing rest break and pursed lip breathing. Pt on 4L O2 with SpO2 94% at EOS. Pt tolerates remaining up in chair at EOS with all needs in reach. Pt currently with functional limitations due to the deficits listed below (see PT Problem List). Pt will benefit from skilled PT to increase their independence and safety with mobility to allow discharge to the venue listed below.          Recommendations for follow up therapy are one component of a multi-disciplinary discharge planning process, led by the attending physician.  Recommendations may be updated based on patient status, additional functional criteria and insurance authorization.  Follow Up Recommendations Home health PT      Assistance Recommended at Discharge PRN  Patient can return home with the following  Assistance with cooking/housework;Assist for transportation;Help with stairs or ramp for entrance    Equipment Recommendations None recommended by PT  Recommendations for Other Services       Functional Status Assessment Patient has had a recent decline in their functional status and demonstrates the ability to make significant improvements in function in  a reasonable and predictable amount of time.     Precautions / Restrictions Precautions Precautions: Fall Precaution Comments: monitor O2 Restrictions Weight Bearing Restrictions: No      Mobility  Bed Mobility Overal bed mobility: Modified Independent  General bed mobility comments: increased time, use of bedrails as needed, no physical assist or cues    Transfers Overall transfer level: Needs assistance Equipment used: None Transfers: Sit to/from Stand, Bed to chair/wheelchair/BSC Sit to Stand: Supervision  Step pivot transfers: Supervision  General transfer comment: pt powers to stand without AD, takes step pivot to recliner at bedside with hand on recliner, supv for safety with therapist managing O2 line    Ambulation/Gait Ambulation/Gait assistance: Min guard Gait Distance (Feet): 150 Feet Assistive device: Straight cane Gait Pattern/deviations: Step-through pattern, Decreased stride length, Wide base of support Gait velocity: decreased     General Gait Details: pt ambulates 150 ft with 1 standing rest break for coughing, step through gait pattern, wide BOS, dyspnea 3/4 and pt on 4L with SpO2 desat to 86% and recovers to 91% with standing rest break and pursed lip breathing  Stairs            Wheelchair Mobility    Modified Rankin (Stroke Patients Only)       Balance Overall balance assessment: Mild deficits observed, not formally tested        Pertinent Vitals/Pain Pain Assessment Pain Assessment: Faces Faces Pain Scale: Hurts little more Pain Location: with coughing Pain Descriptors / Indicators: Discomfort Pain Intervention(s): Limited activity within patient's tolerance, Monitored during session    Home Living Family/patient expects  to be discharged to:: Private residence Living Arrangements: Children (a son) Available Help at Discharge: Available 24 hours/day Type of Home: House Home Access: Stairs to enter Entrance Stairs-Rails:  Psychiatric nurse of Steps: 1   Home Layout: Able to live on main level with bedroom/bathroom Home Equipment: [place] - single point;Rolling Walker (2 wheels);Rollator (4 wheels) Additional Comments: son and familuy live on second floor, goes up 16 steps to do laundry 1 x week,  grand kids take  it up and down steps for  her    Prior Function Prior Level of Function : Independent/Modified Independent  Mobility Comments: pt using SPC in the home or rollator in the home depending on fatigue, ind with in/out of bed and lift chair ADLs Comments: pt reports ind with self care, shares chores with son     Hand Dominance   Dominant Hand: Left    Extremity/Trunk Assessment   Upper Extremity Assessment Upper Extremity Assessment: Defer to OT evaluation    Lower Extremity Assessment Lower Extremity Assessment: Overall WFL for tasks assessed (AROM WFL, strength grossly 4-/5, denies numbness/tingling)    Cervical / Trunk Assessment Cervical / Trunk Assessment: Normal  Communication   Communication: No difficulties  Cognition Arousal/Alertness: Awake/alert Behavior During Therapy: WFL for tasks assessed/performed Overall Cognitive Status: Within Functional Limits for tasks assessed     General Comments General comments (skin integrity, edema, etc.): pt on 4L O2 upon arrival, SpO2 100%; with ambulation and coughing, pt desats to 86% on 4L, able to recover to 91% with pursed lip breathing and rest break    Exercises     Assessment/Plan    PT Assessment Patient needs continued PT services  PT Problem List Decreased activity tolerance;Cardiopulmonary status limiting activity       PT Treatment Interventions DME instruction;Gait training;Functional mobility training;Therapeutic activities;Therapeutic exercise;Balance training;Neuromuscular re-education;Patient/family education    PT Goals (Current goals can be found in the Care Plan section)  Acute Rehab PT Goals Patient  Stated Goal: "go walking" PT Goal Formulation: With patient Time For Goal Achievement: 08/31/22 Potential to Achieve Goals: Good    Frequency Min 3X/week     Co-evaluation               AM-PAC PT "6 Clicks" Mobility  Outcome Measure Help needed turning from your back to your side while in a flat bed without using bedrails?: None Help needed moving from lying on your back to sitting on the side of a flat bed without using bedrails?: None Help needed moving to and from a bed to a chair (including a wheelchair)?: A Little Help needed standing up from a chair using your arms (e.g., wheelchair or bedside chair)?: A Little Help needed to walk in hospital room?: A Little Help needed climbing 3-5 steps with a railing? : A Little 6 Click Score: 20    End of Session Equipment Utilized During Treatment: Gait belt;Oxygen Activity Tolerance: Patient tolerated treatment well Patient left: in chair;with call bell/phone within reach Nurse Communication: Mobility status;Other (comment) (SpO2) PT Visit Diagnosis: Other abnormalities of gait and mobility (R26.89)    Time: 1025-8527 PT Time Calculation (min) (ACUTE ONLY): 32 min   Charges:   PT Evaluation $PT Eval Low Complexity: 1 Low PT Treatments $Gait Training: 8-22 mins         Tori Maverik Foot PT, DPT 08/17/22, 11:24 AM

## 2022-08-18 DIAGNOSIS — I509 Heart failure, unspecified: Secondary | ICD-10-CM

## 2022-08-18 DIAGNOSIS — J441 Chronic obstructive pulmonary disease with (acute) exacerbation: Secondary | ICD-10-CM | POA: Diagnosis not present

## 2022-08-18 DIAGNOSIS — N1832 Chronic kidney disease, stage 3b: Secondary | ICD-10-CM | POA: Diagnosis not present

## 2022-08-18 LAB — GLUCOSE, CAPILLARY
Glucose-Capillary: 73 mg/dL (ref 70–99)
Glucose-Capillary: 96 mg/dL (ref 70–99)
Glucose-Capillary: 142 mg/dL — ABNORMAL HIGH (ref 70–99)

## 2022-08-18 MED ORDER — ALBUTEROL SULFATE (2.5 MG/3ML) 0.083% IN NEBU
2.5000 mg | INHALATION_SOLUTION | Freq: Three times a day (TID) | RESPIRATORY_TRACT | Status: DC
  Administered 2022-08-18 – 2022-08-25 (×22): 2.5 mg via RESPIRATORY_TRACT
  Filled 2022-08-18 (×22): qty 3

## 2022-08-18 NOTE — Plan of Care (Signed)
  Problem: Elimination: Goal: Will not experience complications related to bowel motility Outcome: Progressing Goal: Will not experience complications related to urinary retention Outcome: Progressing   Problem: Pain Managment: Goal: General experience of comfort will improve Outcome: Progressing   Problem: Safety: Goal: Ability to remain free from injury will improve Outcome: Progressing   

## 2022-08-18 NOTE — Progress Notes (Signed)
PT refused BiPAP

## 2022-08-18 NOTE — Progress Notes (Signed)
[facility] PROGRESS NOTE    Progress Note  Pamela [name]  GGY:[id number] DOB: 1950/12/01 DOA: 08/16/2022 PCP: Dorena Dew, FNP     Brief Narrative:   Pamela [name] is an 72 y.o. female past medical history significant for osteoarthritis, lung cancer in remission, chronic diastolic heart failure congenital single kidney, COPD on 2 L of oxygen at home, sarcoid comes in with dyspnea productive cough that started 3 days prior to admission, with an increase need and oxygen requirement, white count of 7.3 hemoglobin of 12 unremarkable BMP chest x-ray showed no acute cardiopulmonary disease    Assessment/Plan:   Acute respiratory failure with hypoxia and hypercarbia likely due to COPD exacerbation: Transition antibiotics and steroids to oral. Continue inhalers. She still requiring 3 L of oxygen to keep saturations greater 90%, she relates she feels significantly short of breath when she ambulates. Wheezing bilaterally with rhonchi on physical exam.  Obstructive sleep apnea: Continue CPAP at night.  Chronic diastolic CHF (congestive heart failure) (HCC) BN P unremarkable, 2D echo showed an EF of 60% no wall motion abnormality. Continue Lasix twice a day.  Chronic kidney disease stage IIIb: Creatinine at baseline continue to monitor.  Prediabetes mellitus: Hemoglobin A1c of 5.4 glucose this morning is 73 she has required no insulin's.  History of lung cancer: Non-small cell cancer status post SBRT in 2012 in remission.  DVT prophylaxis: lovenox Family Communication:none Status is: Observation The patient will require care spanning > 2 midnights and should be moved to inpatient because: Acute respiratory failure with hypoxia    Code Status:     Code Status Orders  (From admission, onward)           Start     Ordered   08/16/22 1551  Full code  Continuous       Question:  By:  Answer:  Consent: discussion documented in EHR   08/16/22 1556            Code Status History     Date Active Date Inactive Code Status Order ID Comments User Context   04/04/2022 0350 04/09/2022 0315 Full Code [id number]  Etta Quill, DO ED   12/17/2019 1525 12/21/2019 0503 Full Code [id number]  Erline Hau, MD ED   07/27/2019 2226 08/02/2019 1950 Full Code [id number]  Phillips Grout, MD ED   01/02/2019 1531 01/05/2019 1748 Full Code [id number]  Karmen Bongo, MD ED   09/06/2018 1101 09/09/2018 2103 Full Code [id number]  Reubin Milan, MD ED   09/06/2018 1101 09/06/2018 1101 Full Code [id number]  Reubin Milan, MD ED   06/12/2014 0047 06/15/2014 1536 Full Code [id number]  Waldemar Dickens, MD ED   05/24/2014 0546 05/28/2014 1750 Full Code [id number]  Ivor Costa, MD Inpatient         IV Access:   Peripheral IV   Procedures and diagnostic studies:   ECHOCARDIOGRAM COMPLETE  Result Date: 08/17/2022    ECHOCARDIOGRAM REPORT   Patient Name:   [name] Date of Exam: 08/17/2022 Medical Rec #:  [id number]      Height:       66.0 in Accession #:    [id number]     Weight:       306.2 lb Date of Birth:  10-Nov-1950      BSA:          2.397 m Patient Age:    5 years       BP:  126/82 mmHg Patient Gender: F              HR:           73 bpm. Exam Location:  Inpatient Procedure: 2D Echo and Intracardiac Opacification Agent Indications:    CHF  History:        Patient has prior history of Echocardiogram examinations, most                 recent 09/06/2018. CHF, COPD; Risk Factors:Hypertension.  Sonographer:    Harvie Junior Referring Phys: [id number] [place]  Sonographer Comments: Technically difficult study due to poor echo windows, patient is obese and no subcostal window. Image acquisition challenging due to patient body habitus, Image acquisition challenging due to COPD and supine. IMPRESSIONS  1. Left ventricular ejection fraction, by estimation, is 60 to 65%. The left ventricle has normal function. The left ventricle has no regional wall  motion abnormalities. Left ventricular diastolic parameters were normal.  2. Right ventricular systolic function is normal. The right ventricular size is normal. There is normal pulmonary artery systolic pressure.  3. The mitral valve is normal in structure. No evidence of mitral valve regurgitation. No evidence of mitral stenosis.  4. The aortic valve is normal in structure. Aortic valve regurgitation is not visualized. No aortic stenosis is present.  5. The inferior vena cava is normal in size with greater than 50% respiratory variability, suggesting right atrial pressure of 3 mmHg. FINDINGS  Left Ventricle: Left ventricular ejection fraction, by estimation, is 60 to 65%. The left ventricle has normal function. The left ventricle has no regional wall motion abnormalities. Definity contrast agent was given IV to delineate the left ventricular  endocardial borders. The left ventricular internal cavity size was normal in size. There is no left ventricular hypertrophy. Left ventricular diastolic parameters were normal. Right Ventricle: The right ventricular size is normal. No increase in right ventricular wall thickness. Right ventricular systolic function is normal. There is normal pulmonary artery systolic pressure. The tricuspid regurgitant velocity is 2.87 m/s, and  with an assumed right atrial pressure of 3 mmHg, the estimated right ventricular systolic pressure is 61.9 mmHg. Left Atrium: Left atrial size was normal in size. Right Atrium: Right atrial size was normal in size. Pericardium: There is no evidence of pericardial effusion. Mitral Valve: The mitral valve is normal in structure. No evidence of mitral valve regurgitation. No evidence of mitral valve stenosis. Tricuspid Valve: The tricuspid valve is normal in structure. Tricuspid valve regurgitation is not demonstrated. No evidence of tricuspid stenosis. Aortic Valve: The aortic valve is normal in structure. Aortic valve regurgitation is not visualized. No  aortic stenosis is present. Aortic valve mean gradient measures 2.0 mmHg. Aortic valve peak gradient measures 4.4 mmHg. Aortic valve area, by VTI measures 3.26 cm. Pulmonic Valve: The pulmonic valve was normal in structure. Pulmonic valve regurgitation is not visualized. No evidence of pulmonic stenosis. Aorta: The aortic root is normal in size and structure. Venous: The inferior vena cava is normal in size with greater than 50% respiratory variability, suggesting right atrial pressure of 3 mmHg. IAS/Shunts: No atrial level shunt detected by color flow Doppler.  LEFT VENTRICLE PLAX 2D LVIDd:         4.80 cm   Diastology LVIDs:         3.00 cm   LV e' medial:    8.70 cm/s LV PW:         0.80 cm   LV E/e'  medial:  9.0 LV IVS:        0.70 cm   LV e' lateral:   9.90 cm/s LVOT diam:     1.80 cm   LV E/e' lateral: 7.9 LV SV:         68 LV SV Index:   28 LVOT Area:     2.54 cm  RIGHT VENTRICLE RV S prime:     17.00 cm/s TAPSE (M-mode): 2.1 cm LEFT ATRIUM           Index LA diam:      3.40 cm 1.42 cm/m LA Vol (A4C): 41.9 ml 17.48 ml/m  AORTIC VALVE                    PULMONIC VALVE AV Area (Vmax):    2.52 cm     PV Vmax:       1.11 m/s AV Area (Vmean):   2.85 cm     PV Peak grad:  4.9 mmHg AV Area (VTI):     3.26 cm AV Vmax:           105.00 cm/s AV Vmean:          65.500 cm/s AV VTI:            0.209 m AV Peak Grad:      4.4 mmHg AV Mean Grad:      2.0 mmHg LVOT Vmax:         104.00 cm/s LVOT Vmean:        73.400 cm/s LVOT VTI:          0.268 m LVOT/AV VTI ratio: 1.28  AORTA Ao Root diam: 3.40 cm Ao Asc diam:  3.40 cm MITRAL VALVE               TRICUSPID VALVE MV Area (PHT): 3.91 cm    TR Peak grad:   32.9 mmHg MV Decel Time: 194 msec    TR Vmax:        287.00 cm/s MV E velocity: 78.40 cm/s MV A velocity: 87.40 cm/s  SHUNTS MV E/A ratio:  0.90        Systemic VTI:  0.27 m                            Systemic Diam: 1.80 cm Aditya Sabharwal Electronically signed by Hebert Soho Signature Date/Time:  08/17/2022/5:38:14 PM    Final    DG Chest 2 View  Result Date: 08/16/2022 CLINICAL DATA:  Shortness of breath EXAM: CHEST - 2 VIEW COMPARISON:  Chest CT April 04, 2022 and chest radiograph April 03, 2022. FINDINGS: Left anterior chest cardiac loop recording device. The heart size and mediastinal contours are unchanged. Similar right upper lobe scarring. Hazy opacities in the lung bases are favored artifactual secondary to technique. No visible pleural effusion or pneumothorax. Thoracic spondylosis. IMPRESSION: No convincing evidence of acute cardiopulmonary disease. Electronically Signed   By: Dahlia Bailiff M.D.   On: 08/16/2022 13:08     Medical Consultants:   None.   Subjective:    Pamela [name] relates that as long as she is on ambulating she does not get pended.  Objective:    Vitals:   08/18/22 0045 08/18/22 0500 08/18/22 0757 08/18/22 0758  BP:  125/62    Pulse: 76 62    Resp: 18 16    Temp:  98.9 F (37.2 C)    TempSrc:  Oral  SpO2: 97% 97% 93% 93%  Weight:      Height:       SpO2: 93 % O2 Flow Rate (L/min): 3 L/min FiO2 (%): 40 %   Intake/Output Summary (Last 24 hours) at 08/18/2022 0849 Last data filed at 08/18/2022 0500 Gross per 24 hour  Intake --  Output 300 ml  Net -300 ml    Filed Weights   08/16/22 1229 08/16/22 2304  Weight: 136.1 kg (!) 138.9 kg    Exam: General exam: In no acute distress. Respiratory system: Good air movement and rhonchi bilaterally Cardiovascular system: S1 & S2 heard, RRR. No JVD. Gastrointestinal system: Abdomen is nondistended, soft and nontender.  Extremities: No pedal edema. Skin: No rashes, lesions or ulcers Psychiatry: Judgement and insight appear normal. Mood & affect appropriate.   Data Reviewed:    Labs: Basic Metabolic Panel: Recent Labs  Lab 08/16/22 1232 08/17/22 0449  NA 141 141  K 3.8 4.4  CL 97* 96*  CO2 32 33*  GLUCOSE 104* 130*  BUN 26* 23  CREATININE 1.45* 1.39*  CALCIUM 8.3*  8.1*    GFR Estimated Creatinine Clearance: 54.9 mL/min (A) (by C-G formula based on SCr of 1.39 mg/dL (H)). Liver Function Tests: Recent Labs  Lab 08/16/22 1232 08/17/22 0449  AST 16 15  ALT 9 9  ALKPHOS 114 104  BILITOT 0.5 0.3  PROT 7.2 6.8  ALBUMIN 4.0 3.6    No results for input(s): "LIPASE", "AMYLASE" in the last 168 hours. No results for input(s): "AMMONIA" in the last 168 hours. Coagulation profile No results for input(s): "INR", "PROTIME" in the last 168 hours. COVID-19 Labs  No results for input(s): "DDIMER", "FERRITIN", "LDH", "CRP" in the last 72 hours.  Lab Results  Component Value Date   SARSCOV2NAA NEGATIVE 04/03/2022   SARSCOV2NAA NEGATIVE 12/17/2019   [place] NEGATIVE 08/01/2019   [place] NEGATIVE 07/27/2019    CBC: Recent Labs  Lab 08/16/22 1232 08/17/22 0449  WBC 7.3 4.3  NEUTROABS 4.1  --   HGB 12.1 11.9*  HCT 40.7 40.5  MCV 74.4* 75.3*  PLT 156 143*    Cardiac Enzymes: No results for input(s): "CKTOTAL", "CKMB", "CKMBINDEX", "TROPONINI" in the last 168 hours. BNP (last 3 results) No results for input(s): "PROBNP" in the last 8760 hours. CBG: Recent Labs  Lab 08/17/22 0753 08/17/22 1155 08/17/22 1641 08/17/22 2106 08/18/22 0735  GLUCAP 124* 135* 123* 146* 73   D-Dimer: No results for input(s): "DDIMER" in the last 72 hours. Hgb A1c: Recent Labs    08/17/22 0449  HGBA1C 5.4   Lipid Profile: No results for input(s): "CHOL", "HDL", "LDLCALC", "TRIG", "CHOLHDL", "LDLDIRECT" in the last 72 hours. Thyroid function studies: No results for input(s): "TSH", "T4TOTAL", "T3FREE", "THYROIDAB" in the last 72 hours.  Invalid input(s): "FREET3" Anemia work up: No results for input(s): "VITAMINB12", "FOLATE", "FERRITIN", "TIBC", "IRON", "RETICCTPCT" in the last 72 hours. Sepsis Labs: Recent Labs  Lab 08/16/22 1232 08/17/22 0449  WBC 7.3 4.3    Microbiology No results found for this or any previous visit (from the past  240 hour(s)).   Medications:    albuterol  2.5 mg Nebulization TID   aspirin EC  81 mg Oral Daily   calcitRIOL  0.25 mcg Oral Daily   doxycycline  100 mg Oral Q12H   enoxaparin (LOVENOX) injection  65 mg Subcutaneous QHS   febuxostat  40 mg Oral Daily   fluticasone furoate-vilanterol  1 puff Inhalation Daily   And  umeclidinium bromide  1 puff Inhalation Daily   furosemide  40 mg Oral Daily   loratadine  10 mg Oral Daily   montelukast  10 mg Oral QHS   mouth rinse  15 mL Mouth Rinse 4 times per day   predniSONE  40 mg Oral Q breakfast   Continuous Infusions:    LOS: 1 day   Charlynne Cousins  [facility]  08/18/2022, 8:49 AM

## 2022-08-18 NOTE — Progress Notes (Signed)
Ambulated patient on 3l/[place] and sats stayed 96%. During ambulation on 2l/[place] pt did drop to 87% at rest on 2l/[place] pt stays 97%.

## 2022-08-19 DIAGNOSIS — J441 Chronic obstructive pulmonary disease with (acute) exacerbation: Secondary | ICD-10-CM | POA: Diagnosis not present

## 2022-08-19 LAB — CBC
HCT: 37 % (ref 36.0–46.0)
Hemoglobin: 10.9 g/dL — ABNORMAL LOW (ref 12.0–15.0)
MCH: 21.9 pg — ABNORMAL LOW (ref 26.0–34.0)
MCHC: 29.5 g/dL — ABNORMAL LOW (ref 30.0–36.0)
MCV: 74.4 fL — ABNORMAL LOW (ref 80.0–100.0)
Platelets: 162 10*3/uL (ref 150–400)
RBC: 4.97 MIL/uL (ref 3.87–5.11)
RDW: 15 % (ref 11.5–15.5)
WBC: 4.5 10*3/uL (ref 4.0–10.5)
nRBC: 0 % (ref 0.0–0.2)

## 2022-08-19 LAB — GLUCOSE, CAPILLARY
Glucose-Capillary: 79 mg/dL (ref 70–99)
Glucose-Capillary: 97 mg/dL (ref 70–99)
Glucose-Capillary: 179 mg/dL — ABNORMAL HIGH (ref 70–99)
Glucose-Capillary: 121 mg/dL — ABNORMAL HIGH (ref 70–99)

## 2022-08-19 MED ORDER — TRAZODONE HCL 50 MG PO TABS: 50.0000 mg | ORAL_TABLET | Freq: Every evening | ORAL | Status: DC | PRN

## 2022-08-19 MED ORDER — HYDRALAZINE HCL 20 MG/ML IJ SOLN: 10.0000 mg | INTRAMUSCULAR | Status: DC | PRN

## 2022-08-19 MED ORDER — SENNOSIDES-DOCUSATE SODIUM 8.6-50 MG PO TABS: 1.0000 | ORAL_TABLET | Freq: Every evening | ORAL | Status: DC | PRN

## 2022-08-19 MED ORDER — IPRATROPIUM-ALBUTEROL 0.5-2.5 (3) MG/3ML IN SOLN
3.0000 mL | RESPIRATORY_TRACT | Status: DC | PRN
  Administered 2022-08-21: 3 mL via RESPIRATORY_TRACT
  Filled 2022-08-19: qty 3

## 2022-08-19 MED ORDER — METOPROLOL TARTRATE 5 MG/5ML IV SOLN: 5.0000 mg | INTRAVENOUS | Status: DC | PRN

## 2022-08-19 NOTE — Progress Notes (Signed)
PROGRESS NOTE    Pamela [name]  ZOX:[id number] DOB: 12/14/1950 DOA: 08/16/2022 PCP: Dorena Dew, FNP   Brief Narrative:  72 y.o. female past medical history significant for osteoarthritis, lung cancer in remission, chronic diastolic heart failure congenital single kidney, COPD on 2 L of oxygen at home, sarcoid comes in with dyspnea productive cough that started 3 days prior to admission, with an increase need and oxygen requirement, white count of 7.3 hemoglobin of 12 unremarkable BMP chest x-ray showed no acute cardiopulmonary disease     Assessment & Plan:  Principal Problem:   COPD with acute exacerbation ([place]) Active Problems:   Chronic diastolic CHF (congestive heart failure) (HCC)   Stage 3b chronic kidney disease ([place])   Prediabetes   Obstructive sleep apnea   Acute on chronic respiratory failure with hypoxia and hypercapnia (HCC)   Vitamin D deficiency   Acute respiratory failure with hypercapnia (HCC)    Acute respiratory failure with hypoxia and hypercarbia likely due to COPD exacerbation: Patient continues to have expiratory wheezing and hypoxia with ambulation.  Still has quite a bit abnormal breath sounds.  Continue bronchodilators, doxycycline, prednisone.  I-S/flutter valve. BNP 37   Obstructive sleep apnea: Continue CPAP at night.   Chronic diastolic CHF (congestive heart failure) (HCC) BN P unremarkable, 2D echo showed an EF of 60% no wall motion abnormality. Continue Lasix daily   Chronic kidney disease stage IIIb: Creatinine at baseline continue to monitor.  Lab work from this morning is pending   Prediabetes mellitus: Hemoglobin A1c of 5.4 blood glucose is stable   History of lung cancer: Non-small cell cancer status post SBRT in 2012 in remission.   DVT prophylaxis: lovenox Family Communication:none  The patient will require care spanning > 2 midnights and should be moved to inpatient because: Acute respiratory failure with  hypoxia     Subjective: Seen and examined at bedside.  Still has exertional dyspnea.  With ambulation on 2 L nasal cannula she desaturates down to the 85%.  Denies any chest pain   Examination:  General exam: Appears calm and comfortable, 2 L nasal cannula Respiratory system: Bilateral rhonchi with some expiratory wheezing diffusely Cardiovascular system: S1 & S2 heard, RRR. No JVD, murmurs, rubs, gallops or clicks. No pedal edema. Gastrointestinal system: Abdomen is nondistended, soft and nontender. No organomegaly or masses felt. Normal bowel sounds heard. Central nervous system: Alert and oriented. No focal neurological deficits. Extremities: Symmetric 5 x 5 power. Skin: No rashes, lesions or ulcers Psychiatry: Judgement and insight appear normal. Mood & affect appropriate.     Objective: Vitals:   08/18/22 2107 08/19/22 0439 08/19/22 0841 08/19/22 1020  BP: (!) 130/56 116/64  120/69  Pulse: 78 69  70  Resp: 18 18  20   Temp: 98.8 F (37.1 C) (!) 97.5 F (36.4 C)    TempSrc: Oral Oral    SpO2: 95% 99% (!) 89%   Weight:      Height:        Intake/Output Summary (Last 24 hours) at 08/19/2022 1152 Last data filed at 08/19/2022 0900 Gross per 24 hour  Intake 600 ml  Output --  Net 600 ml   Filed Weights   08/16/22 1229 08/16/22 2304  Weight: 136.1 kg (!) 138.9 kg     Data Reviewed:   CBC: Recent Labs  Lab 08/16/22 1232 08/17/22 0449 08/19/22 0916  WBC 7.3 4.3 4.5  NEUTROABS 4.1  --   --   HGB 12.1 11.9* 10.9*  HCT  40.7 40.5 37.0  MCV 74.4* 75.3* 74.4*  PLT 156 143* 161   Basic Metabolic Panel: Recent Labs  Lab 08/16/22 1232 08/17/22 0449  NA 141 141  K 3.8 4.4  CL 97* 96*  CO2 32 33*  GLUCOSE 104* 130*  BUN 26* 23  CREATININE 1.45* 1.39*  CALCIUM 8.3* 8.1*   GFR: Estimated Creatinine Clearance: 54.9 mL/min (A) (by C-G formula based on SCr of 1.39 mg/dL (H)). Liver Function Tests: Recent Labs  Lab 08/16/22 1232 08/17/22 0449  AST 16 15   ALT 9 9  ALKPHOS 114 104  BILITOT 0.5 0.3  PROT 7.2 6.8  ALBUMIN 4.0 3.6   No results for input(s): "LIPASE", "AMYLASE" in the last 168 hours. No results for input(s): "AMMONIA" in the last 168 hours. Coagulation Profile: No results for input(s): "INR", "PROTIME" in the last 168 hours. Cardiac Enzymes: No results for input(s): "CKTOTAL", "CKMB", "CKMBINDEX", "TROPONINI" in the last 168 hours. BNP (last 3 results) No results for input(s): "PROBNP" in the last 8760 hours. HbA1C: Recent Labs    08/17/22 0449  HGBA1C 5.4   CBG: Recent Labs  Lab 08/18/22 0735 08/18/22 1205 08/18/22 2104 08/19/22 0717 08/19/22 1134  GLUCAP 73 96 142* 79 97   Lipid Profile: No results for input(s): "CHOL", "HDL", "LDLCALC", "TRIG", "CHOLHDL", "LDLDIRECT" in the last 72 hours. Thyroid Function Tests: No results for input(s): "TSH", "T4TOTAL", "FREET4", "T3FREE", "THYROIDAB" in the last 72 hours. Anemia Panel: No results for input(s): "VITAMINB12", "FOLATE", "FERRITIN", "TIBC", "IRON", "RETICCTPCT" in the last 72 hours. Sepsis Labs: No results for input(s): "PROCALCITON", "LATICACIDVEN" in the last 168 hours.  No results found for this or any previous visit (from the past 240 hour(s)).       Radiology Studies: ECHOCARDIOGRAM COMPLETE  Result Date: 08/17/2022    ECHOCARDIOGRAM REPORT   Patient Name:   [facility] Date of Exam: 08/17/2022 Medical Rec #:  [id number]      Height:       66.0 in Accession #:    [id number]     Weight:       306.2 lb Date of Birth:  [birth year]      BSA:          2.397 m Patient Age:    52 years       BP:           126/82 mmHg Patient Gender: F              HR:           73 bpm. Exam Location:  Inpatient Procedure: 2D Echo and Intracardiac Opacification Agent Indications:    CHF  History:        Patient has prior history of Echocardiogram examinations, most                 recent 09/06/2018. CHF, COPD; Risk Factors:Hypertension.  Sonographer:    Harvie Junior Referring  Phys: [id number] [place]  Sonographer Comments: Technically difficult study due to poor echo windows, patient is obese and no subcostal window. Image acquisition challenging due to patient body habitus, Image acquisition challenging due to COPD and supine. IMPRESSIONS  1. Left ventricular ejection fraction, by estimation, is 60 to 65%. The left ventricle has normal function. The left ventricle has no regional wall motion abnormalities. Left ventricular diastolic parameters were normal.  2. Right ventricular systolic function is normal. The right ventricular size is normal. There is normal pulmonary artery systolic pressure.  3.  The mitral valve is normal in structure. No evidence of mitral valve regurgitation. No evidence of mitral stenosis.  4. The aortic valve is normal in structure. Aortic valve regurgitation is not visualized. No aortic stenosis is present.  5. The inferior vena cava is normal in size with greater than 50% respiratory variability, suggesting right atrial pressure of 3 mmHg. FINDINGS  Left Ventricle: Left ventricular ejection fraction, by estimation, is 60 to 65%. The left ventricle has normal function. The left ventricle has no regional wall motion abnormalities. Definity contrast agent was given IV to delineate the left ventricular  endocardial borders. The left ventricular internal cavity size was normal in size. There is no left ventricular hypertrophy. Left ventricular diastolic parameters were normal. Right Ventricle: The right ventricular size is normal. No increase in right ventricular wall thickness. Right ventricular systolic function is normal. There is normal pulmonary artery systolic pressure. The tricuspid regurgitant velocity is 2.87 m/s, and  with an assumed right atrial pressure of 3 mmHg, the estimated right ventricular systolic pressure is 28.7 mmHg. Left Atrium: Left atrial size was normal in size. Right Atrium: Right atrial size was normal in size. Pericardium: There  is no evidence of pericardial effusion. Mitral Valve: The mitral valve is normal in structure. No evidence of mitral valve regurgitation. No evidence of mitral valve stenosis. Tricuspid Valve: The tricuspid valve is normal in structure. Tricuspid valve regurgitation is not demonstrated. No evidence of tricuspid stenosis. Aortic Valve: The aortic valve is normal in structure. Aortic valve regurgitation is not visualized. No aortic stenosis is present. Aortic valve mean gradient measures 2.0 mmHg. Aortic valve peak gradient measures 4.4 mmHg. Aortic valve area, by VTI measures 3.26 cm. Pulmonic Valve: The pulmonic valve was normal in structure. Pulmonic valve regurgitation is not visualized. No evidence of pulmonic stenosis. Aorta: The aortic root is normal in size and structure. Venous: The inferior vena cava is normal in size with greater than 50% respiratory variability, suggesting right atrial pressure of 3 mmHg. IAS/Shunts: No atrial level shunt detected by color flow Doppler.  LEFT VENTRICLE PLAX 2D LVIDd:         4.80 cm   Diastology LVIDs:         3.00 cm   LV e' medial:    8.70 cm/s LV PW:         0.80 cm   LV E/e' medial:  9.0 LV IVS:        0.70 cm   LV e' lateral:   9.90 cm/s LVOT diam:     1.80 cm   LV E/e' lateral: 7.9 LV SV:         68 LV SV Index:   28 LVOT Area:     2.54 cm  RIGHT VENTRICLE RV S prime:     17.00 cm/s TAPSE (M-mode): 2.1 cm LEFT ATRIUM           Index LA diam:      3.40 cm 1.42 cm/m LA Vol (A4C): 41.9 ml 17.48 ml/m  AORTIC VALVE                    PULMONIC VALVE AV Area (Vmax):    2.52 cm     PV Vmax:       1.11 m/s AV Area (Vmean):   2.85 cm     PV Peak grad:  4.9 mmHg AV Area (VTI):     3.26 cm AV Vmax:  105.00 cm/s AV Vmean:          65.500 cm/s AV VTI:            0.209 m AV Peak Grad:      4.4 mmHg AV Mean Grad:      2.0 mmHg LVOT Vmax:         104.00 cm/s LVOT Vmean:        73.400 cm/s LVOT VTI:          0.268 m LVOT/AV VTI ratio: 1.28  AORTA Ao Root diam: 3.40 cm  Ao Asc diam:  3.40 cm MITRAL VALVE               TRICUSPID VALVE MV Area (PHT): 3.91 cm    TR Peak grad:   32.9 mmHg MV Decel Time: 194 msec    TR Vmax:        287.00 cm/s MV E velocity: 78.40 cm/s MV A velocity: 87.40 cm/s  SHUNTS MV E/A ratio:  0.90        Systemic VTI:  0.27 m                            Systemic Diam: 1.80 cm Aditya Sabharwal Electronically signed by Hebert Soho Signature Date/Time: 08/17/2022/5:38:14 PM    Final         Scheduled Meds:  albuterol  2.5 mg Nebulization TID   aspirin EC  81 mg Oral Daily   calcitRIOL  0.25 mcg Oral Daily   doxycycline  100 mg Oral Q12H   enoxaparin (LOVENOX) injection  65 mg Subcutaneous QHS   febuxostat  40 mg Oral Daily   fluticasone furoate-vilanterol  1 puff Inhalation Daily   And   umeclidinium bromide  1 puff Inhalation Daily   furosemide  40 mg Oral Daily   loratadine  10 mg Oral Daily   montelukast  10 mg Oral QHS   mouth rinse  15 mL Mouth Rinse 4 times per day   predniSONE  40 mg Oral Q breakfast   Continuous Infusions:   LOS: 2 days   Time spent= 35 mins    Elysabeth Aust Arsenio Loader, MD [facility]  If 7PM-7AM, please contact night-coverage  08/19/2022, 11:52 AM

## 2022-08-19 NOTE — Progress Notes (Signed)
SATURATION QUALIFICATIONS: (This note is used to comply with regulatory documentation for home oxygen)  Patient Saturations on Room Air at Rest = 95% on 2 liters  Patient Saturations on 2 Liters of oxygen while Ambulating = 85%  Recovers at rest on 2 liters to 97%.   Please briefly explain why patient needs home oxygen: Pt on 2 liters oxygen baseline at home. Continues to need oxygen at 2 liters. Oxygen sats while on 2 liters during ambulation.   Shunsuke Granzow, Laurel Dimmer, RN

## 2022-08-19 NOTE — Progress Notes (Signed)
PT continues to utilize Flutter device. PC at this time.

## 2022-08-19 NOTE — Progress Notes (Signed)
Physical Therapy Treatment Patient Details Name: Pamela [name] MRN: [id number] DOB: [birth year] Today's Date: 08/19/2022   History of Present Illness Pamela [name] is a 72 y.o. female admitted with COPD with acute exacerbation. PMH: osteoarthritis, asthma, lung cancer, CHF, congenital single kidney, COPD on home [place] oxygen at 2 LPM, gout, sarcoidosis, sickle cell trait, sleep apnea    PT Comments    The patient  ambulated x 50' with RW. SPO2 93% on 4 LPM. Patient plans  to DC home with HHPT.   Recommendations for follow up therapy are one component of a multi-disciplinary discharge planning process, led by the attending physician.  Recommendations may be updated based on patient status, additional functional criteria and insurance authorization.  Follow Up Recommendations  Home health PT     Assistance Recommended at Discharge  intermittent  Patient can return home with the following Assistance with cooking/housework;Assist for transportation;Help with stairs or ramp for entrance   Equipment Recommendations  None recommended by PT    Recommendations for Other Services       Precautions / Restrictions Precautions Precautions: Fall Precaution Comments: monitor O2     Mobility  Bed Mobility               General bed mobility comments: patient was up in recliner and returned to the same at end of session    Transfers     Transfers: Sit to/from Stand Sit to Stand: Min guard           General transfer comment: pt powers to stand without AD,  rocks to get momentum, used rail in [employer]    Ambulation/Gait Ambulation/Gait assistance: Counsellor (Feet): 15 Feet (then 150) Assistive device: Straight cane, Rolling walker (2 wheels) Gait Pattern/deviations: Step-through pattern, Decreased stride length, Wide base of support Gait velocity: decreased     General Gait Details: very slow and cautious with SPC, improved confidence with RW   Stairs              Wheelchair Mobility    Modified Rankin (Stroke Patients Only)       Balance                                            Cognition Arousal/Alertness: Awake/alert                                              Exercises      General Comments        Pertinent Vitals/Pain Pain Assessment Pain Assessment: No/denies pain    Home Living                          Prior Function            PT Goals (current goals can now be found in the care plan section) Progress towards PT goals: Progressing toward goals    Frequency    Min 3X/week      PT Plan Current plan remains appropriate    Co-evaluation              AM-PAC PT "6 Clicks" Mobility   Outcome Measure  Help needed turning from your back to your side while in a  flat bed without using bedrails?: None Help needed moving from lying on your back to sitting on the side of a flat bed without using bedrails?: None Help needed moving to and from a bed to a chair (including a wheelchair)?: A Little Help needed standing up from a chair using your arms (e.g., wheelchair or bedside chair)?: A Little Help needed to walk in hospital room?: A Little Help needed climbing 3-5 steps with a railing? : A Little 6 Click Score: 20    End of Session Equipment Utilized During Treatment: Gait belt;Oxygen Activity Tolerance: Patient tolerated treatment well Patient left: in chair;with call bell/phone within reach Nurse Communication: Mobility status;Other (comment) PT Visit Diagnosis: Other abnormalities of gait and mobility (R26.89)     Time: 1338-1400 PT Time Calculation (min) (ACUTE ONLY): 22 min  Charges:  $Gait Training: 8-22 mins                     [place] Office [phone] Weekend [phone]    Claretha Cooper 08/19/2022, 3:25 PM

## 2022-08-20 DIAGNOSIS — J441 Chronic obstructive pulmonary disease with (acute) exacerbation: Secondary | ICD-10-CM | POA: Diagnosis not present

## 2022-08-20 LAB — MAGNESIUM: Magnesium: 1.4 mg/dL — ABNORMAL LOW (ref 1.7–2.4)

## 2022-08-20 LAB — BASIC METABOLIC PANEL
Anion gap: 10 (ref 5–15)
BUN: 50 mg/dL — ABNORMAL HIGH (ref 8–23)
CO2: 34 mmol/L — ABNORMAL HIGH (ref 22–32)
Calcium: 7.6 mg/dL — ABNORMAL LOW (ref 8.9–10.3)
Chloride: 94 mmol/L — ABNORMAL LOW (ref 98–111)
Creatinine, Ser: 1.53 mg/dL — ABNORMAL HIGH (ref 0.44–1.00)
GFR, Estimated: 37 mL/min — ABNORMAL LOW (ref >60)
Glucose, Bld: 96 mg/dL (ref 70–99)
Potassium: 4.8 mmol/L (ref 3.5–5.1)
Sodium: 138 mmol/L (ref 135–145)

## 2022-08-20 LAB — GLUCOSE, CAPILLARY
Glucose-Capillary: 73 mg/dL (ref 70–99)
Glucose-Capillary: 103 mg/dL — ABNORMAL HIGH (ref 70–99)
Glucose-Capillary: 119 mg/dL — ABNORMAL HIGH (ref 70–99)
Glucose-Capillary: 191 mg/dL — ABNORMAL HIGH (ref 70–99)

## 2022-08-20 MED ORDER — COVID-19 MRNA 2023-2024 VACCINE (COMIRNATY) 0.3 ML INJECTION
0.3000 mL | Freq: Once | INTRAMUSCULAR | Status: DC
  Filled 2022-08-20: qty 0.3

## 2022-08-20 NOTE — Plan of Care (Signed)
  Problem: Health Behavior/Discharge Planning: Goal: Ability to manage health-related needs will improve Outcome: Progressing   Problem: Clinical Measurements: Goal: Ability to maintain clinical measurements within normal limits will improve Outcome: Progressing Goal: Will remain free from infection Outcome: Progressing Goal: Diagnostic test results will improve Outcome: Progressing Goal: Respiratory complications will improve Outcome: Progressing Goal: Cardiovascular complication will be avoided Outcome: Progressing   Problem: Respiratory: Goal: Ability to maintain a clear airway will improve Outcome: Progressing Goal: Levels of oxygenation will improve Outcome: Progressing Goal: Ability to maintain adequate ventilation will improve Outcome: Progressing   Problem: Education: Goal: Knowledge of General Education information will improve Description: Including pain rating scale, medication(s)/side effects and non-pharmacologic comfort measures Outcome: Adequate for Discharge   Problem: Activity: Goal: Risk for activity intolerance will decrease Outcome: Adequate for Discharge   Problem: Nutrition: Goal: Adequate nutrition will be maintained Outcome: Adequate for Discharge   Problem: Coping: Goal: Level of anxiety will decrease Outcome: Adequate for Discharge   Problem: Elimination: Goal: Will not experience complications related to bowel motility Outcome: Adequate for Discharge Goal: Will not experience complications related to urinary retention Outcome: Adequate for Discharge   Problem: Pain Managment: Goal: General experience of comfort will improve Outcome: Adequate for Discharge   Problem: Safety: Goal: Ability to remain free from injury will improve Outcome: Adequate for Discharge   Problem: Skin Integrity: Goal: Risk for impaired skin integrity will decrease Outcome: Adequate for Discharge   Problem: Education: Goal: Knowledge of disease or condition  will improve Outcome: Adequate for Discharge Goal: Knowledge of the prescribed therapeutic regimen will improve Outcome: Adequate for Discharge   Problem: Activity: Goal: Ability to tolerate increased activity will improve Outcome: Adequate for Discharge Goal: Will verbalize the importance of balancing activity with adequate rest periods Outcome: Adequate for Discharge   Problem: Education: Goal: Individualized Educational Video(s) Outcome: Not Applicable

## 2022-08-20 NOTE — Progress Notes (Signed)
Mobility Specialist - Progress Note   08/20/22 1113  Mobility  Activity Ambulated with assistance in hallway  Level of Assistance Standby assist, set-up cues, supervision of patient - no hands on  Assistive Device Front wheel walker  Distance Ambulated (ft) 250 ft  Activity Response Tolerated well  Mobility Referral Yes  $Mobility charge 1 Mobility   Nurse requested Mobility Specialist to perform oxygen saturation test with pt which includes removing pt from oxygen both at rest and while ambulating.  Below are the results from that testing.     Patient Saturations on Room Air at Rest = spO2 92%  Patient Saturations on Room Air while Ambulating = sp02 86% .  Rested and performed pursed lip breathing for 1 minute with sp02 at 90%.  Patient Saturations on 3 Liters of oxygen while Ambulating = sp02 90%  At end of testing pt left in room on 3  Liters of oxygen.  Reported results to nurse.    Pt received in recliner and agreeable to mobility. C/o feeling weaker today compared to yesterday. Pt to recliner after session with all needs met.    Pre-mobility: 65 HR, 92% SpO2 (RA) During mobility: 85 HR, 86% SpO2 (RA) Post-mobility: 68 HR, 95% SPO2 (3L [place])  Set designer

## 2022-08-20 NOTE — Care Management Important Message (Signed)
Important Message  Patient Details IM Letter given. Name: Pamela [name] MRN: [id number] Date of Birth: 20-Feb-1951   Medicare Important Message Given:  Yes     Kerin Salen 08/20/2022, 2:30 PM

## 2022-08-20 NOTE — Progress Notes (Signed)
PROGRESS NOTE    Pamela [name]  FMB:[id number] DOB: 11/29/1950 DOA: 08/16/2022 PCP: Dorena Dew, FNP   Brief Narrative:  72 y.o. female past medical history significant for osteoarthritis, lung cancer in remission, chronic diastolic heart failure congenital single kidney, COPD on 2 L of oxygen at home, sarcoid comes in with dyspnea productive cough that started 3 days prior to admission, with an increase need and oxygen requirement, white count of 7.3 hemoglobin of 12 unremarkable BMP chest x-ray showed no acute cardiopulmonary disease     Assessment & Plan:  Principal Problem:   COPD with acute exacerbation ([place]) Active Problems:   Chronic diastolic CHF (congestive heart failure) (HCC)   Stage 3b chronic kidney disease ([place])   Prediabetes   Obstructive sleep apnea   Acute on chronic respiratory failure with hypoxia and hypercapnia (HCC)   Vitamin D deficiency   Acute respiratory failure with hypercapnia (HCC)    Acute respiratory failure with hypoxia and hypercarbia likely due to COPD exacerbation:  Still has quite a bit abnormal breath sounds.  Continue bronchodilators, doxycycline, prednisone.  I-S/flutter valve. BNP 37   Obstructive sleep apnea: Continue CPAP at night.   Chronic diastolic CHF (congestive heart failure) (HCC) BN P unremarkable, 2D echo showed an EF of 60% no wall motion abnormality. Continue Lasix daily   Chronic kidney disease stage IIIb: Creatinine at baseline continue to monitor.  Lab work from this morning is pending   Prediabetes mellitus: Hemoglobin A1c of 5.4 blood glucose is stable   History of lung cancer: Non-small cell cancer status post SBRT in 2012 in remission.   DVT prophylaxis: lovenox Family Communication:none  The patient will require care spanning > 2 midnights and should be moved to inpatient because: Still with significant abnormal breath sounds   Subjective: Seen and examined at bedside.  Still has exertional dyspnea  and coughing.  Examination:  Constitutional: Not in acute distress Respiratory: Bilateral rhonchi with some expiratory wheezing Cardiovascular: Normal sinus rhythm, no rubs Abdomen: Nontender nondistended good bowel sounds Musculoskeletal: No edema noted Skin: No rashes seen Neurologic: CN 2-12 grossly intact.  And nonfocal Psychiatric: Normal judgment and insight. Alert and oriented x 3. Normal mood.  Objective: Vitals:   08/19/22 1259 08/19/22 2157 08/20/22 0802 08/20/22 0810  BP: 137/63 138/77 122/72   Pulse: 65 71 62   Resp: 18 20 20    Temp: 98.9 F (37.2 C) 98.3 F (36.8 C) 97.8 F (36.6 C)   TempSrc: Oral Oral Oral   SpO2: 97% 100% 100% 100%  Weight:      Height:        Intake/Output Summary (Last 24 hours) at 08/20/2022 1246 Last data filed at 08/20/2022 1146 Gross per 24 hour  Intake 1080 ml  Output 1400 ml  Net -320 ml   Filed Weights   08/16/22 1229 08/16/22 2304  Weight: 136.1 kg (!) 138.9 kg     Data Reviewed:   CBC: Recent Labs  Lab 08/16/22 1232 08/17/22 0449 08/19/22 0916  WBC 7.3 4.3 4.5  NEUTROABS 4.1  --   --   HGB 12.1 11.9* 10.9*  HCT 40.7 40.5 37.0  MCV 74.4* 75.3* 74.4*  PLT 156 143* 701   Basic Metabolic Panel: Recent Labs  Lab 08/16/22 1232 08/17/22 0449 08/20/22 0510  NA 141 141 138  K 3.8 4.4 4.8  CL 97* 96* 94*  CO2 32 33* 34*  GLUCOSE 104* 130* 96  BUN 26* 23 50*  CREATININE 1.45* 1.39* 1.53*  CALCIUM 8.3* 8.1* 7.6*  MG  --   --  1.4*   GFR: Estimated Creatinine Clearance: 49.9 mL/min (A) (by C-G formula based on SCr of 1.53 mg/dL (H)). Liver Function Tests: Recent Labs  Lab 08/16/22 1232 08/17/22 0449  AST 16 15  ALT 9 9  ALKPHOS 114 104  BILITOT 0.5 0.3  PROT 7.2 6.8  ALBUMIN 4.0 3.6   No results for input(s): "LIPASE", "AMYLASE" in the last 168 hours. No results for input(s): "AMMONIA" in the last 168 hours. Coagulation Profile: No results for input(s): "INR", "PROTIME" in the last 168 hours. Cardiac  Enzymes: No results for input(s): "CKTOTAL", "CKMB", "CKMBINDEX", "TROPONINI" in the last 168 hours. BNP (last 3 results) No results for input(s): "PROBNP" in the last 8760 hours. HbA1C: No results for input(s): "HGBA1C" in the last 72 hours.  CBG: Recent Labs  Lab 08/19/22 1134 08/19/22 1718 08/19/22 2154 08/20/22 0725 08/20/22 1148  GLUCAP 97 179* 121* 73 103*   Lipid Profile: No results for input(s): "CHOL", "HDL", "LDLCALC", "TRIG", "CHOLHDL", "LDLDIRECT" in the last 72 hours. Thyroid Function Tests: No results for input(s): "TSH", "T4TOTAL", "FREET4", "T3FREE", "THYROIDAB" in the last 72 hours. Anemia Panel: No results for input(s): "VITAMINB12", "FOLATE", "FERRITIN", "TIBC", "IRON", "RETICCTPCT" in the last 72 hours. Sepsis Labs: No results for input(s): "PROCALCITON", "LATICACIDVEN" in the last 168 hours.  No results found for this or any previous visit (from the past 240 hour(s)).       Radiology Studies: No results found.      Scheduled Meds:  albuterol  2.5 mg Nebulization TID   aspirin EC  81 mg Oral Daily   calcitRIOL  0.25 mcg Oral Daily   doxycycline  100 mg Oral Q12H   enoxaparin (LOVENOX) injection  65 mg Subcutaneous QHS   febuxostat  40 mg Oral Daily   fluticasone furoate-vilanterol  1 puff Inhalation Daily   And   umeclidinium bromide  1 puff Inhalation Daily   furosemide  40 mg Oral Daily   loratadine  10 mg Oral Daily   montelukast  10 mg Oral QHS   mouth rinse  15 mL Mouth Rinse 4 times per day   predniSONE  40 mg Oral Q breakfast   Continuous Infusions:   LOS: 3 days   Time spent= 35 mins    Lamonta Cypress Arsenio Loader, MD [facility]  If 7PM-7AM, please contact night-coverage  08/20/2022, 12:46 PM

## 2022-08-21 DIAGNOSIS — J441 Chronic obstructive pulmonary disease with (acute) exacerbation: Secondary | ICD-10-CM | POA: Diagnosis not present

## 2022-08-21 LAB — BASIC METABOLIC PANEL
Anion gap: 7 (ref 5–15)
BUN: 52 mg/dL — ABNORMAL HIGH (ref 8–23)
CO2: 39 mmol/L — ABNORMAL HIGH (ref 22–32)
Calcium: 7.9 mg/dL — ABNORMAL LOW (ref 8.9–10.3)
Chloride: 93 mmol/L — ABNORMAL LOW (ref 98–111)
Creatinine, Ser: 1.52 mg/dL — ABNORMAL HIGH (ref 0.44–1.00)
GFR, Estimated: 37 mL/min — ABNORMAL LOW (ref >60)
Glucose, Bld: 90 mg/dL (ref 70–99)
Potassium: 5 mmol/L (ref 3.5–5.1)
Sodium: 139 mmol/L (ref 135–145)

## 2022-08-21 LAB — MAGNESIUM: Magnesium: 1.5 mg/dL — ABNORMAL LOW (ref 1.7–2.4)

## 2022-08-21 LAB — GLUCOSE, CAPILLARY
Glucose-Capillary: 83 mg/dL (ref 70–99)
Glucose-Capillary: 130 mg/dL — ABNORMAL HIGH (ref 70–99)
Glucose-Capillary: 103 mg/dL — ABNORMAL HIGH (ref 70–99)
Glucose-Capillary: 169 mg/dL — ABNORMAL HIGH (ref 70–99)

## 2022-08-21 MED ORDER — MAGNESIUM SULFATE 4 GM/100ML IV SOLN
4.0000 g | Freq: Once | INTRAVENOUS | Status: AC
  Administered 2022-08-21: 4 g via INTRAVENOUS
  Filled 2022-08-21: qty 100

## 2022-08-21 NOTE — Progress Notes (Signed)
Received report from ongoing nurse. Patient is pain free with no concerned at this time. Patient resting in bed. Easily arousible. Will assume care of patient as assigned nurse. Patient does not express to need anything at this time.

## 2022-08-21 NOTE — Progress Notes (Signed)
Physical Therapy Treatment Patient Details Name: Pamela [name] MRN: [id number] DOB: 15-Jan-1951 Today's Date: 08/21/2022   History of Present Illness Pamela [name] is a 72 y.o. female admitted with COPD with acute exacerbation. PMH: osteoarthritis, asthma, lung cancer, CHF, congenital single kidney, COPD on home [place] oxygen at 2 LPM, gout, sarcoidosis, sickle cell trait, sleep apnea    PT Comments    General Comments: AxO x 3 very pleasant and motivated [place] who lives with her Son and Daughter in [place].  Son is a Chief Technology Officer and Daughter in [place] is a NP.  Pt OOB in recliner.  Assisted with amb a great distance in hallway.  General transfer comment: self able and good use of hands to steady self.  General Gait Details: "Let me use the walker" stated pt.  Assisted with amb a great distance in hallway on 3 lts O2 sats avg 92%.  "I use to use 2 lts but it don't do me no more" now requires 3 lts. Pt plans to return home with family support when medically ready.  No equipment needs.  Has several walkers and a cane.    Recommendations for follow up therapy are one component of a multi-disciplinary discharge planning process, led by the attending physician.  Recommendations may be updated based on patient status, additional functional criteria and insurance authorization.  Follow Up Recommendations  Home health PT     Assistance Recommended at Discharge PRN  Patient can return home with the following Assistance with cooking/housework;Assist for transportation;Help with stairs or ramp for entrance   Equipment Recommendations  None recommended by PT    Recommendations for Other Services       Precautions / Restrictions Precautions Precautions: Fall Precaution Comments: Home O2 Restrictions Weight Bearing Restrictions: No     Mobility  Bed Mobility               General bed mobility comments: OOB in recliner    Transfers Overall transfer level: Needs assistance Equipment used:  None Transfers: Sit to/from Stand Sit to Stand: Supervision           General transfer comment: self able and good use of hands to steady self    Ambulation/Gait Ambulation/Gait assistance: Supervision, Min guard Gait Distance (Feet): 125 Feet Assistive device: Rolling walker (2 wheels) Gait Pattern/deviations: Step-through pattern, Decreased stride length, Wide base of support Gait velocity: decreased     General Gait Details: "Let me use the walker" stated pt.  Assisted with amb a great distance in hallway on 3 lts O2 sats avg 92%.  "I use to use 2 lts but it don't do me no more" now requires 3 lts.   Stairs             Wheelchair Mobility    Modified Rankin (Stroke Patients Only)       Balance                                            Cognition Arousal/Alertness: Awake/alert   Overall Cognitive Status: Within Functional Limits for tasks assessed                                 General Comments: AxO x 3 very pleasant and motivated [place] who lives with her Son and Daughter in [place].  Son  is a Chief Technology Officer and Daughter in [place] is a NP.        Exercises      General Comments        Pertinent Vitals/Pain Pain Assessment Pain Assessment: No/denies pain    Home Living                          Prior Function            PT Goals (current goals can now be found in the care plan section) Progress towards PT goals: Progressing toward goals    Frequency    Min 3X/week      PT Plan Current plan remains appropriate    Co-evaluation              AM-PAC PT "6 Clicks" Mobility   Outcome Measure  Help needed turning from your back to your side while in a flat bed without using bedrails?: None Help needed moving from lying on your back to sitting on the side of a flat bed without using bedrails?: None Help needed moving to and from a bed to a chair (including a wheelchair)?: None Help needed standing  up from a chair using your arms (e.g., wheelchair or bedside chair)?: None Help needed to walk in hospital room?: A Little Help needed climbing 3-5 steps with a railing? : A Little 6 Click Score: 22    End of Session Equipment Utilized During Treatment: Gait belt;Oxygen Activity Tolerance: Patient tolerated treatment well Patient left: in chair;with call bell/phone within reach Nurse Communication: Mobility status PT Visit Diagnosis: Other abnormalities of gait and mobility (R26.89)     Time: 1425-1450 PT Time Calculation (min) (ACUTE ONLY): 25 min  Charges:  $Gait Training: 8-22 mins $Therapeutic Activity: 8-22 mins                     {Tiphany Fayson  PTA Acute  [employer] M-F          [phone] Weekend pager [phone]

## 2022-08-21 NOTE — Progress Notes (Signed)
Assisted patient to bathroom. Patient has increased dsypnea on exertion. Return to bed  with assist. Called Respiratory for PRN breathing treatment. Patient received medication. Patient recovered and is now resting in bed with no resp. distress. O2 98% on 3 L [place].

## 2022-08-21 NOTE — Progress Notes (Signed)
PROGRESS NOTE    Pamela [name]  TGG:[id number] DOB: August 28, 1950 DOA: 08/16/2022 PCP: Dorena Dew, FNP   Brief Narrative:  72 y.o. female past medical history significant for osteoarthritis, lung cancer in remission, chronic diastolic heart failure congenital single kidney, COPD on 2 L of oxygen at home, sarcoid comes in with dyspnea productive cough that started 3 days prior to admission, with an increase need and oxygen requirement, white count of 7.3 hemoglobin of 12 unremarkable BMP chest x-ray showed no acute cardiopulmonary disease     Assessment & Plan:  Principal Problem:   COPD with acute exacerbation ([place]) Active Problems:   Chronic diastolic CHF (congestive heart failure) (HCC)   Stage 3b chronic kidney disease ([place])   Prediabetes   Obstructive sleep apnea   Acute on chronic respiratory failure with hypoxia and hypercapnia (HCC)   Vitamin D deficiency   Acute respiratory failure with hypercapnia (HCC)    Acute respiratory failure with hypoxia and hypercarbia likely due to COPD exacerbation: Still has some abnormal breath sounds but slow to improve.  Has exertional dyspnea.  On 3 L nasal cannula.  Continue bronchodilators, doxycycline, prednisone.  I-S/flutter valve. BNP 37   Obstructive sleep apnea: Continue CPAP at night.   Chronic diastolic CHF (congestive heart failure) (HCC) BN P unremarkable, 2D echo showed an EF of 60% no wall motion abnormality. Continue Lasix daily   Chronic kidney disease stage IIIb: Creatinine at baseline continue to monitor.  Lab work from this morning is pending   Prediabetes mellitus: Hemoglobin A1c of 5.4 blood glucose is stable   History of lung cancer: Non-small cell cancer status post SBRT in 2012 in remission.  Hypomagnesemia-repletion   DVT prophylaxis: lovenox Family Communication:none  The patient will require care spanning > 2 midnights and should be moved to inpatient because: Still with significant abnormal  breath sounds, likely discharge in next 24 hours   Subjective: Seen and examined at bedside.  Tells me that she does get exertional dyspnea.  She is coughing quite a bit. Examination:  Constitutional: Not in acute distress, 3 L nasal cannula Respiratory: Diffuse bilateral mild rhonchi with some expiratory wheezing Cardiovascular: Normal sinus rhythm, no rubs Abdomen: Nontender nondistended good bowel sounds Musculoskeletal: No edema noted Skin: No rashes seen Neurologic: CN 2-12 grossly intact.  And nonfocal Psychiatric: Normal judgment and insight. Alert and oriented x 3. Normal mood.  Objective: Vitals:   08/20/22 2105 08/21/22 0456 08/21/22 0613 08/21/22 0809  BP: 109/78  134/71   Pulse: 72  63   Resp: 20  20   Temp: 98.4 F (36.9 C)  98 F (36.7 C)   TempSrc: Oral  Oral   SpO2: 99% 98% 100% 98%  Weight:      Height:        Intake/Output Summary (Last 24 hours) at 08/21/2022 1220 Last data filed at 08/21/2022 0731 Gross per 24 hour  Intake 600 ml  Output 2200 ml  Net -1600 ml   Filed Weights   08/16/22 1229 08/16/22 2304  Weight: 136.1 kg (!) 138.9 kg     Data Reviewed:   CBC: Recent Labs  Lab 08/16/22 1232 08/17/22 0449 08/19/22 0916  WBC 7.3 4.3 4.5  NEUTROABS 4.1  --   --   HGB 12.1 11.9* 10.9*  HCT 40.7 40.5 37.0  MCV 74.4* 75.3* 74.4*  PLT 156 143* 703   Basic Metabolic Panel: Recent Labs  Lab 08/16/22 1232 08/17/22 0449 08/20/22 0510 08/21/22 0431  NA 141 141  138 139  K 3.8 4.4 4.8 5.0  CL 97* 96* 94* 93*  CO2 32 33* 34* 39*  GLUCOSE 104* 130* 96 90  BUN 26* 23 50* 52*  CREATININE 1.45* 1.39* 1.53* 1.52*  CALCIUM 8.3* 8.1* 7.6* 7.9*  MG  --   --  1.4* 1.5*   GFR: Estimated Creatinine Clearance: 50.2 mL/min (A) (by C-G formula based on SCr of 1.52 mg/dL (H)). Liver Function Tests: Recent Labs  Lab 08/16/22 1232 08/17/22 0449  AST 16 15  ALT 9 9  ALKPHOS 114 104  BILITOT 0.5 0.3  PROT 7.2 6.8  ALBUMIN 4.0 3.6   No results  for input(s): "LIPASE", "AMYLASE" in the last 168 hours. No results for input(s): "AMMONIA" in the last 168 hours. Coagulation Profile: No results for input(s): "INR", "PROTIME" in the last 168 hours. Cardiac Enzymes: No results for input(s): "CKTOTAL", "CKMB", "CKMBINDEX", "TROPONINI" in the last 168 hours. BNP (last 3 results) No results for input(s): "PROBNP" in the last 8760 hours. HbA1C: No results for input(s): "HGBA1C" in the last 72 hours.  CBG: Recent Labs  Lab 08/20/22 1148 08/20/22 1641 08/20/22 2105 08/21/22 0723 08/21/22 1158  GLUCAP 103* 119* 191* 83 130*   Lipid Profile: No results for input(s): "CHOL", "HDL", "LDLCALC", "TRIG", "CHOLHDL", "LDLDIRECT" in the last 72 hours. Thyroid Function Tests: No results for input(s): "TSH", "T4TOTAL", "FREET4", "T3FREE", "THYROIDAB" in the last 72 hours. Anemia Panel: No results for input(s): "VITAMINB12", "FOLATE", "FERRITIN", "TIBC", "IRON", "RETICCTPCT" in the last 72 hours. Sepsis Labs: No results for input(s): "PROCALCITON", "LATICACIDVEN" in the last 168 hours.  No results found for this or any previous visit (from the past 240 hour(s)).       Radiology Studies: No results found.      Scheduled Meds:  albuterol  2.5 mg Nebulization TID   aspirin EC  81 mg Oral Daily   calcitRIOL  0.25 mcg Oral Daily   doxycycline  100 mg Oral Q12H   enoxaparin (LOVENOX) injection  65 mg Subcutaneous QHS   febuxostat  40 mg Oral Daily   fluticasone furoate-vilanterol  1 puff Inhalation Daily   And   umeclidinium bromide  1 puff Inhalation Daily   furosemide  40 mg Oral Daily   loratadine  10 mg Oral Daily   montelukast  10 mg Oral QHS   mouth rinse  15 mL Mouth Rinse 4 times per day   Continuous Infusions:  magnesium sulfate bolus IVPB 4 g (08/21/22 1131)     LOS: 4 days   Time spent= 35 mins    Kiamesha Samet Arsenio Loader, MD [facility]  If 7PM-7AM, please contact night-coverage  08/21/2022, 12:20 PM

## 2022-08-21 NOTE — Progress Notes (Deleted)
P 

## 2022-08-22 DIAGNOSIS — J441 Chronic obstructive pulmonary disease with (acute) exacerbation: Secondary | ICD-10-CM | POA: Diagnosis not present

## 2022-08-22 LAB — CBC
HCT: 41 % (ref 36.0–46.0)
Hemoglobin: 12.2 g/dL (ref 12.0–15.0)
MCH: 22 pg — ABNORMAL LOW (ref 26.0–34.0)
MCHC: 29.8 g/dL — ABNORMAL LOW (ref 30.0–36.0)
MCV: 73.9 fL — ABNORMAL LOW (ref 80.0–100.0)
Platelets: 159 10*3/uL (ref 150–400)
RBC: 5.55 MIL/uL — ABNORMAL HIGH (ref 3.87–5.11)
RDW: 15.1 % (ref 11.5–15.5)
WBC: 5.2 10*3/uL (ref 4.0–10.5)
nRBC: 0 % (ref 0.0–0.2)

## 2022-08-22 LAB — BASIC METABOLIC PANEL
Anion gap: 10 (ref 5–15)
BUN: 51 mg/dL — ABNORMAL HIGH (ref 8–23)
CO2: 38 mmol/L — ABNORMAL HIGH (ref 22–32)
Calcium: 8.2 mg/dL — ABNORMAL LOW (ref 8.9–10.3)
Chloride: 91 mmol/L — ABNORMAL LOW (ref 98–111)
Creatinine, Ser: 1.51 mg/dL — ABNORMAL HIGH (ref 0.44–1.00)
GFR, Estimated: 37 mL/min — ABNORMAL LOW (ref >60)
Glucose, Bld: 85 mg/dL (ref 70–99)
Potassium: 4.2 mmol/L (ref 3.5–5.1)
Sodium: 139 mmol/L (ref 135–145)

## 2022-08-22 LAB — GLUCOSE, CAPILLARY
Glucose-Capillary: 68 mg/dL — ABNORMAL LOW (ref 70–99)
Glucose-Capillary: 99 mg/dL (ref 70–99)
Glucose-Capillary: 117 mg/dL — ABNORMAL HIGH (ref 70–99)
Glucose-Capillary: 173 mg/dL — ABNORMAL HIGH (ref 70–99)

## 2022-08-22 LAB — MAGNESIUM: Magnesium: 2.3 mg/dL (ref 1.7–2.4)

## 2022-08-22 MED ORDER — METHYLPREDNISOLONE SODIUM SUCC 40 MG IJ SOLR
40.0000 mg | Freq: Two times a day (BID) | INTRAMUSCULAR | Status: DC
  Administered 2022-08-22 – 2022-08-24 (×5): 40 mg via INTRAVENOUS
  Filled 2022-08-22 (×5): qty 1

## 2022-08-22 NOTE — Progress Notes (Deleted)
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     History of Present Illness  04/27/2014 1st [facility] office visit/ Pamela [name]   Chief Complaint  Patient presents with   Pulmonary Consult    Referred by Dr. [name]. [name]  Pt recently moved to [place] from [name] and states needs to establish with new pulmonary MD.  She c/o DOE and cough, and states that she feels these symptoms are currently under control.    on prednisone 10 mg daily  since late July 2015 and still on freq saba and 2lpm  Already used   2puffs proair am of ov  rec Stop spiriva and advair Start dulera 100 Take 2 puffs first thing in am and then another 2 puffs about 12 hours later.  Work on inhaler technique:  relax and gently blow all the way out then take a nice smooth deep breath back in, triggering the inhaler at same time you start breathing in.  Hold for up to 5 seconds if you can.  Rinse and gargle with water when done Only use your albuterol (proair) as a rescue medication  If proair not helping, then use the neb and if needing the neb more than occastional, then take prednisone 10 mg daily  Please schedule a follow up office visit in 4 weeks, sooner if needed with all inhalers in hand for pfts on return     04/01/2020  f/u ov/Pamela [name] re:  Asthma / MO  Chief Complaint  Patient presents with   Follow-up    Reports productive cough x4 days.  Dyspnea:  [employer] with cane with or without 02  Cough: since 9/9 clear mucus Sleeping: flat bed with  with 3 pillows  SABA use: neither right now  02: prn  rec Plan A = Automatic = Always=    Symbicort 160 Take 2 puffs first thing in am and then another 2 puffs about 12 hours later.  And singulair at night and stop spiriva  Work on inhaler technique:   Plan B = Backup (to supplement plan A, not to replace it) Only use your albuterol inhaler (puffer) as a rescue medication  Plan C = Crisis (instead of Plan B but only if Plan B stops working) - only use your albuterol nebulizer if you first try Plan B and it fails to help > ok to use the nebulizer up to every 4 hours but if start needing it regularly call for immediate appointment Plan D = Deltasone  - call me if B and C not adequate > add prednisone Prednisone 10 mg take  4 each am x 2 days,   2 each am x 2 days,  1 each am x 2 days and stop (refillable)  Plan E = ER - go to ER or call 911 if all else fails   Make sure you check your oxygen saturations at highest level  Pantoprazole (protonix) 40  mg   Take  30-60 min before first meal of the day and Pepcid (famotidine)  20 mg one @  bedtime until return to office - this is the best way to tell whether stomach acid is contributing to your problem.   GERD diet   05/13/2020  f/u ov/Pamela [name] re:  Asthma / MO/ maint on symbicort 160/ singulair though hfa poor  Chief Complaint  Patient presents with   Follow-up    Denies any problems with breathing   Dyspnea:  [employer] with cane  - walks with sats always above 90% Cough: none  Sleeping: flat bed, 3 pillows "like a baby" SABA use: symbicort 80 2bid / not needing saba at all/ no need for prednisone 02: none at all since [name] trip  rec Stop pantoprazole and change famotidine twice daily after bfast and after supper for full week then just the dose after supper for a week then stop - if you have any obvious problems start back on previous  GERD diet Ok to stop the oxygen   11/12/2020  f/u ov/Pamela [name] re: asthma/ MO - no longer on ppi or singulair  x months Chief Complaint  Patient presents with   Follow-up    Sob-same, no cough   Dyspnea:  Does [employer] shopping pushing the cart, walks with cane  Cough: none  Sleeping: flat bed/ 3 pillows  SABA use: none / symbicort 160 2bid  02: none  Covid status:  vax x 3/ pfizer  Rec Plan A = Automatic = Always=    symbicort 160 Take 2 puffs first thing in am and then another 2 puffs about 12 hours later.  Plan B = Backup (to supplement plan A, not to replace it) Only use your albuterol inhaler as a rescue medication  Plan C = Crisis (instead of Plan B but only if Plan B stops working) - only use your albuterol nebulizer if you first try Plan B    11/12/2021  Annual f/u ov/Pamela [name] re: asthma / MO  maint on generic advair 55 2 each am  (supposed to be two bid  Chief Complaint  Patient presents with   Follow-up    Patient feels like she is doing good with her breathing, no concerns  Dyspnea:  [employer] shopping with a cane  Cough: none  Sleeping: flat bed/ 3 pillows  SABA use: nebulizer daily  02: none  Covid status:   vax x 4  Rec Plan A = Automatic = Always=   Fluticasone is Take 2 puffs (2 separate clicks) first thing in am and then another 2 puffs about 12 hours later.   Plan B = Backup (to supplement plan A, not to replace it) Only use your albuterol inhaler as a rescue medication  Plan C = Crisis (instead of Plan B but only if Plan B stops working) - only use your albuterol nebulizer if you first try Plan B   08/24/2022  f/u ov/Pamela [name] re: ***   maint on ***  No chief complaint on file.   Dyspnea:  *** Cough: *** Sleeping: *** SABA use: *** 02: *** Covid status:   *** Lung cancer screening :  ***    No obvious day to day or daytime variability or assoc excess/ purulent sputum or mucus plugs or hemoptysis or cp or chest tightness, subjective wheeze or overt sinus or hb symptoms.   *** without nocturnal  or early am exacerbation  of respiratory  c/o's or need for noct saba. Also  denies any obvious fluctuation of symptoms with weather or environmental changes or other aggravating or alleviating factors except as outlined above   No unusual exposure hx or h/o childhood pna/ asthma or knowledge of premature birth.  Current Allergies, Complete Past Medical History, Past Surgical History, Family History, and Social History were reviewed in [employer] record.  ROS  The following are not active complaints unless bolded Hoarseness, sore throat, dysphagia, dental problems, itching, sneezing,  nasal congestion or discharge of excess mucus or purulent secretions, ear ache,   fever, chills, sweats, unintended wt loss or wt gain, classically pleuritic or exertional cp,  orthopnea pnd or arm/hand swelling  or leg swelling, presyncope, palpitations, abdominal pain, anorexia, nausea, vomiting, diarrhea  or change in bowel habits or change in bladder habits, change in stools or change in urine, dysuria, hematuria,  rash, arthralgias, visual complaints, headache, numbness, weakness or ataxia or problems with walking or coordination,  change in mood or  memory.        No outpatient medications have been marked as taking for the 08/24/22 encounter (Appointment) with Tanda Rockers, MD.             Objective:   Physical Exam  Wts  08/24/2022     ***  11/12/2021   354  11/12/2020   342 05/13/2020 325  04/01/2020  330   02/23/2019    354   08/30/2018  366   08/02/2018  360   11/30/2014   343   Vital signs reviewed  08/24/2022  - Note at rest 02 sats  ***% on ***   General appearance:    ***      2 + pitting both LEs  severe elephantiasis changes both LE's***

## 2022-08-22 NOTE — Progress Notes (Signed)
PROGRESS NOTE    Pamela [name]  YWV:[id number] DOB: 1951/02/27 DOA: 08/16/2022 PCP: Dorena Dew, FNP   Brief Narrative:  72 y.o. female past medical history significant for osteoarthritis, lung cancer in remission, chronic diastolic heart failure congenital single kidney, COPD on 2 L of oxygen at home, sarcoid comes in with dyspnea productive cough that started 3 days prior to admission, with an increase need and oxygen requirement, white count of 7.3 hemoglobin of 12 unremarkable BMP chest x-ray showed no acute cardiopulmonary disease     Assessment & Plan:  Principal Problem:   COPD with acute exacerbation ([place]) Active Problems:   Chronic diastolic CHF (congestive heart failure) (HCC)   Stage 3b chronic kidney disease ([place])   Prediabetes   Obstructive sleep apnea   Acute on chronic respiratory failure with hypoxia and hypercapnia (HCC)   Vitamin D deficiency   Acute respiratory failure with hypercapnia (HCC)    Acute respiratory failure with hypoxia and hypercarbia likely due to COPD exacerbation: Unfortunately she is still having bilateral rhonchi/abnormal breath sounds with dyspnea on exertion.  Continue supplemental oxygen, bronchodilators, I-S/flutter valve.  Finish total 5 days of oral doxycycline.  Will transition prednisone to IV Solu-Medrol   Obstructive sleep apnea: Continue CPAP at night.   Chronic diastolic CHF (congestive heart failure) (HCC) BN P unremarkable, 2D echo showed an EF of 60% no wall motion abnormality. Continue Lasix daily   Chronic kidney disease stage IIIb: Creatinine at baseline continue to monitor.  Lab work from this morning is pending   Prediabetes mellitus: Hemoglobin A1c of 5.4 blood glucose is stable   History of lung cancer: Non-small cell cancer status post SBRT in 2012 in remission.  Hypomagnesemia-repletion   DVT prophylaxis: lovenox Family Communication:none  The patient will require care spanning > 2 midnights and  should be moved to inpatient because: Still with significant abnormal breath sounds, likely discharge in next 24 hours   Subjective: Seen and examined at bedside.  Still having quite a bit of coughing spells and dyspnea on exertion. Examination:  Constitutional: Not in acute distress, 3 L nasal cannula Respiratory: Diffuse rhonchi bilaterally Cardiovascular: Normal sinus rhythm, no rubs Abdomen: Nontender nondistended good bowel sounds Musculoskeletal: No edema noted Skin: No rashes seen Neurologic: CN 2-12 grossly intact.  And nonfocal Psychiatric: Normal judgment and insight. Alert and oriented x 3. Normal mood.  Objective: Vitals:   08/21/22 2055 08/22/22 0516 08/22/22 1324 08/22/22 1327  BP: 108/60 112/64  (!) 121/52  Pulse: 64 70  70  Resp: 19 20  18   Temp: 98.3 F (36.8 C) 97.9 F (36.6 C)  97.9 F (36.6 C)  TempSrc: Oral Oral  Oral  SpO2: 98% 97% 98% 100%  Weight:  (!) 143.7 kg    Height:        Intake/Output Summary (Last 24 hours) at 08/22/2022 1343 Last data filed at 08/22/2022 1312 Gross per 24 hour  Intake 1007.63 ml  Output 1750 ml  Net -742.37 ml   Filed Weights   08/16/22 1229 08/16/22 2304 08/22/22 0516  Weight: 136.1 kg (!) 138.9 kg (!) 143.7 kg     Data Reviewed:   CBC: Recent Labs  Lab 08/16/22 1232 08/17/22 0449 08/19/22 0916 08/22/22 0813  WBC 7.3 4.3 4.5 5.2  NEUTROABS 4.1  --   --   --   HGB 12.1 11.9* 10.9* 12.2  HCT 40.7 40.5 37.0 41.0  MCV 74.4* 75.3* 74.4* 73.9*  PLT 156 143* 162 159   Basic  Metabolic Panel: Recent Labs  Lab 08/16/22 1232 08/17/22 0449 08/20/22 0510 08/21/22 0431 08/22/22 0813  NA 141 141 138 139 139  K 3.8 4.4 4.8 5.0 4.2  CL 97* 96* 94* 93* 91*  CO2 32 33* 34* 39* 38*  GLUCOSE 104* 130* 96 90 85  BUN 26* 23 50* 52* 51*  CREATININE 1.45* 1.39* 1.53* 1.52* 1.51*  CALCIUM 8.3* 8.1* 7.6* 7.9* 8.2*  MG  --   --  1.4* 1.5* 2.3   GFR: Estimated Creatinine Clearance: 51.7 mL/min (A) (by C-G formula based  on SCr of 1.51 mg/dL (H)). Liver Function Tests: Recent Labs  Lab 08/16/22 1232 08/17/22 0449  AST 16 15  ALT 9 9  ALKPHOS 114 104  BILITOT 0.5 0.3  PROT 7.2 6.8  ALBUMIN 4.0 3.6   No results for input(s): "LIPASE", "AMYLASE" in the last 168 hours. No results for input(s): "AMMONIA" in the last 168 hours. Coagulation Profile: No results for input(s): "INR", "PROTIME" in the last 168 hours. Cardiac Enzymes: No results for input(s): "CKTOTAL", "CKMB", "CKMBINDEX", "TROPONINI" in the last 168 hours. BNP (last 3 results) No results for input(s): "PROBNP" in the last 8760 hours. HbA1C: No results for input(s): "HGBA1C" in the last 72 hours.  CBG: Recent Labs  Lab 08/21/22 1158 08/21/22 1637 08/21/22 2054 08/22/22 0731 08/22/22 1155  GLUCAP 130* 103* 169* 68* 99   Lipid Profile: No results for input(s): "CHOL", "HDL", "LDLCALC", "TRIG", "CHOLHDL", "LDLDIRECT" in the last 72 hours. Thyroid Function Tests: No results for input(s): "TSH", "T4TOTAL", "FREET4", "T3FREE", "THYROIDAB" in the last 72 hours. Anemia Panel: No results for input(s): "VITAMINB12", "FOLATE", "FERRITIN", "TIBC", "IRON", "RETICCTPCT" in the last 72 hours. Sepsis Labs: No results for input(s): "PROCALCITON", "LATICACIDVEN" in the last 168 hours.  No results found for this or any previous visit (from the past 240 hour(s)).       Radiology Studies: No results found.      Scheduled Meds:  albuterol  2.5 mg Nebulization TID   aspirin EC  81 mg Oral Daily   calcitRIOL  0.25 mcg Oral Daily   doxycycline  100 mg Oral Q12H   enoxaparin (LOVENOX) injection  65 mg Subcutaneous QHS   febuxostat  40 mg Oral Daily   fluticasone furoate-vilanterol  1 puff Inhalation Daily   And   umeclidinium bromide  1 puff Inhalation Daily   furosemide  40 mg Oral Daily   loratadine  10 mg Oral Daily   methylPREDNISolone (SOLU-MEDROL) injection  40 mg Intravenous Q12H   montelukast  10 mg Oral QHS   mouth rinse  15  mL Mouth Rinse 4 times per day   Continuous Infusions:     LOS: 5 days   Time spent= 35 mins    Shenell Rogalski Arsenio Loader, MD [facility]  If 7PM-7AM, please contact night-coverage  08/22/2022, 1:43 PM

## 2022-08-22 NOTE — Progress Notes (Signed)
Mobility Specialist - Progress Note   08/22/22 0905  Mobility  Activity Ambulated with assistance to bathroom  Level of Assistance Standby assist, set-up cues, supervision of patient - no hands on  Assistive Device Front wheel walker  Distance Ambulated (ft) 12 ft  Range of Motion/Exercises Active  Activity Response Tolerated well  Mobility Referral Yes  $Mobility charge 1 Mobility   Pt was found in bed wanting to ambulate to the bathroom. Once sitting EOB stated feeling a little lightheaded. At EOS returned to recliner chair with all necessities in reach and NT notified.   Ferd Hibbs Mobility Specialist

## 2022-08-23 DIAGNOSIS — J441 Chronic obstructive pulmonary disease with (acute) exacerbation: Secondary | ICD-10-CM | POA: Diagnosis not present

## 2022-08-23 LAB — BASIC METABOLIC PANEL
Anion gap: 9 (ref 5–15)
BUN: 62 mg/dL — ABNORMAL HIGH (ref 8–23)
CO2: 36 mmol/L — ABNORMAL HIGH (ref 22–32)
Calcium: 8.1 mg/dL — ABNORMAL LOW (ref 8.9–10.3)
Chloride: 91 mmol/L — ABNORMAL LOW (ref 98–111)
Creatinine, Ser: 1.38 mg/dL — ABNORMAL HIGH (ref 0.44–1.00)
GFR, Estimated: 41 mL/min — ABNORMAL LOW (ref >60)
Glucose, Bld: 129 mg/dL — ABNORMAL HIGH (ref 70–99)
Potassium: 5.5 mmol/L — ABNORMAL HIGH (ref 3.5–5.1)
Sodium: 136 mmol/L (ref 135–145)

## 2022-08-23 LAB — GLUCOSE, CAPILLARY
Glucose-Capillary: 108 mg/dL — ABNORMAL HIGH (ref 70–99)
Glucose-Capillary: 143 mg/dL — ABNORMAL HIGH (ref 70–99)
Glucose-Capillary: 150 mg/dL — ABNORMAL HIGH (ref 70–99)
Glucose-Capillary: 173 mg/dL — ABNORMAL HIGH (ref 70–99)

## 2022-08-23 LAB — POTASSIUM: Potassium: 4.9 mmol/L (ref 3.5–5.1)

## 2022-08-23 LAB — MAGNESIUM: Magnesium: 2 mg/dL (ref 1.7–2.4)

## 2022-08-23 MED ORDER — ENOXAPARIN SODIUM 80 MG/0.8ML IJ SOSY
70.0000 mg | PREFILLED_SYRINGE | Freq: Every day | INTRAMUSCULAR | Status: DC
  Administered 2022-08-23 – 2022-08-24 (×2): 70 mg via SUBCUTANEOUS
  Filled 2022-08-23 (×2): qty 0.8

## 2022-08-23 NOTE — Progress Notes (Signed)
Physical Therapy Treatment Patient Details Name: Pamela [name] MRN: [id number] DOB: 02-02-1951 Today's Date: 08/23/2022   History of Present Illness Pamela [name] is a 72 y.o. female admitted with COPD with acute exacerbation. PMH: osteoarthritis, asthma, lung cancer, CHF, congenital single kidney, COPD on home [place] oxygen at 2 LPM, gout, sarcoidosis, sickle cell trait, sleep apnea    PT Comments    Pt assisted with ambulating and able to complete her goal today of ambulating entire length/distance of unit.  Pt required cues for rest breaks and breathing.  Pt anticipates d/c home tomorrow.   Recommendations for follow up therapy are one component of a multi-disciplinary discharge planning process, led by the attending physician.  Recommendations may be updated based on patient status, additional functional criteria and insurance authorization.  Follow Up Recommendations  Home health PT     Assistance Recommended at Discharge PRN  Patient can return home with the following Assistance with cooking/housework;Assist for transportation;Help with stairs or ramp for entrance   Equipment Recommendations  None recommended by PT    Recommendations for Other Services       Precautions / Restrictions Precautions Precautions: Fall Precaution Comments: baseline 2L O2     Mobility  Bed Mobility               General bed mobility comments: OOB in recliner    Transfers Overall transfer level: Needs assistance Equipment used: None Transfers: Sit to/from Stand Sit to Stand: Supervision           General transfer comment: performs safely    Ambulation/Gait Ambulation/Gait assistance: Supervision Gait Distance (Feet): 400 Feet Assistive device: Rolling walker (2 wheels) Gait Pattern/deviations: Step-through pattern, Decreased stride length, Wide base of support Gait velocity: decreased     General Gait Details: cues for rest breaks, required 4 standing rest breaks for  breathing; pt requested mobilizing on 3L and SPO2 92% on 3L O2 [place]   Stairs             Wheelchair Mobility    Modified Rankin (Stroke Patients Only)       Balance                                            Cognition Arousal/Alertness: Awake/alert Behavior During Therapy: WFL for tasks assessed/performed Overall Cognitive Status: Within Functional Limits for tasks assessed                                          Exercises      General Comments        Pertinent Vitals/Pain Pain Assessment Pain Assessment: No/denies pain    Home Living                          Prior Function            PT Goals (current goals can now be found in the care plan section) Progress towards PT goals: Progressing toward goals    Frequency    Min 3X/week      PT Plan Current plan remains appropriate    Co-evaluation              AM-PAC PT "6 Clicks" Mobility   Outcome Measure  Help needed  turning from your back to your side while in a flat bed without using bedrails?: None Help needed moving from lying on your back to sitting on the side of a flat bed without using bedrails?: None Help needed moving to and from a bed to a chair (including a wheelchair)?: None Help needed standing up from a chair using your arms (e.g., wheelchair or bedside chair)?: None Help needed to walk in hospital room?: A Little Help needed climbing 3-5 steps with a railing? : A Little 6 Click Score: 22    End of Session Equipment Utilized During Treatment: Gait belt;Oxygen Activity Tolerance: Patient tolerated treatment well Patient left: in chair;with call bell/phone within reach Nurse Communication: Mobility status PT Visit Diagnosis: Other abnormalities of gait and mobility (R26.89)     Time: 7225-7505 PT Time Calculation (min) (ACUTE ONLY): 16 min  Charges:  $Gait Training: 8-22 mins                     Jannette Spanner PT, DPT Physical  Therapist Acute Rehabilitation Services Preferred contact method: Secure Chat Weekend Pager Only: [phone] Office: [place] 08/23/2022, 1:42 PM

## 2022-08-23 NOTE — Progress Notes (Signed)
PROGRESS NOTE    Pamela [name]  XBJ:[id number] DOB: [birth year] DOA: 08/16/2022 PCP: Dorena Dew, FNP   Brief Narrative:  72 y.o. female past medical history significant for osteoarthritis, lung cancer in remission, chronic diastolic heart failure congenital single kidney, COPD on 2 L of oxygen at home, sarcoid comes in with dyspnea productive cough that started 3 days prior to admission, with an increase need and oxygen requirement, white count of 7.3 hemoglobin of 12 unremarkable BMP chest x-ray showed no acute cardiopulmonary disease     Assessment & Plan:  Principal Problem:   COPD with acute exacerbation ([place]) Active Problems:   Chronic diastolic CHF (congestive heart failure) (HCC)   Stage 3b chronic kidney disease ([place])   Prediabetes   Obstructive sleep apnea   Acute on chronic respiratory failure with hypoxia and hypercapnia (HCC)   Vitamin D deficiency   Acute respiratory failure with hypercapnia (HCC)    Acute respiratory failure with hypoxia and hypercarbia likely due to COPD exacerbation: Unfortunately she is still having bilateral rhonchi/abnormal breath sounds with dyspnea on exertion.  Continue supplemental oxygen, bronchodilators, I-S/flutter valve.  Completed course of doxycycline, now on IV Solu-Medrol.   Obstructive sleep apnea: Continue CPAP at night.   Chronic diastolic CHF (congestive heart failure) (HCC) BN P unremarkable, 2D echo showed an EF of 60% no wall motion abnormality. Continue Lasix 40 mg orally daily   Chronic kidney disease stage IIIb: Creatinine around baseline of 1.4.  Hyperkalemia/elevated BUN - Suspect secondary to steroids.  Morning potassium 5.5, repeat shows 4.9.  Will continue to monitor this.   Prediabetes mellitus: Hemoglobin A1c of 5.4 blood glucose is stable   History of lung cancer: Non-small cell cancer status post SBRT in 2012 in remission.  Hypomagnesemia-repletion   DVT prophylaxis: lovenox Family  Communication:none  The patient will require care spanning > 2 midnights and should be moved to inpatient because: Still with significant abnormal breath sounds, likely discharge in next 24 hours   Subjective: Still has cough and congestion.  Examination: Constitutional: Not in acute distress; 3l [place] Respiratory: diffuse exp wheezing.  Cardiovascular: Normal sinus rhythm, no rubs Abdomen: Nontender nondistended good bowel sounds Musculoskeletal: No edema noted Skin: No rashes seen Neurologic: CN 2-12 grossly intact.  And nonfocal Psychiatric: Normal judgment and insight. Alert and oriented x 3. Normal mood.    Objective: Vitals:   08/22/22 2052 08/23/22 0502 08/23/22 0819 08/23/22 0909  BP:  134/67  (!) 135/59  Pulse: 85 61  84  Resp:  20  20  Temp:  98.2 F (36.8 C)    TempSrc:  Oral    SpO2: 94% 95% 92% 93%  Weight:      Height:        Intake/Output Summary (Last 24 hours) at 08/23/2022 1124 Last data filed at 08/23/2022 0815 Gross per 24 hour  Intake 840 ml  Output 2550 ml  Net -1710 ml   Filed Weights   08/16/22 1229 08/16/22 2304 08/22/22 0516  Weight: 136.1 kg (!) 138.9 kg (!) 143.7 kg     Data Reviewed:   CBC: Recent Labs  Lab 08/16/22 1232 08/17/22 0449 08/19/22 0916 08/22/22 0813  WBC 7.3 4.3 4.5 5.2  NEUTROABS 4.1  --   --   --   HGB 12.1 11.9* 10.9* 12.2  HCT 40.7 40.5 37.0 41.0  MCV 74.4* 75.3* 74.4* 73.9*  PLT 156 143* 162 308   Basic Metabolic Panel: Recent Labs  Lab 08/17/22 0449 08/20/22 0510 08/21/22  7322 08/22/22 0813 08/23/22 0442 08/23/22 0903  NA 141 138 139 139 136  --   K 4.4 4.8 5.0 4.2 5.5* 4.9  CL 96* 94* 93* 91* 91*  --   CO2 33* 34* 39* 38* 36*  --   GLUCOSE 130* 96 90 85 129*  --   BUN 23 50* 52* 51* 62*  --   CREATININE 1.39* 1.53* 1.52* 1.51* 1.38*  --   CALCIUM 8.1* 7.6* 7.9* 8.2* 8.1*  --   MG  --  1.4* 1.5* 2.3 2.0  --    GFR: Estimated Creatinine Clearance: 56.5 mL/min (A) (by C-G formula based on SCr of  1.38 mg/dL (H)). Liver Function Tests: Recent Labs  Lab 08/16/22 1232 08/17/22 0449  AST 16 15  ALT 9 9  ALKPHOS 114 104  BILITOT 0.5 0.3  PROT 7.2 6.8  ALBUMIN 4.0 3.6   No results for input(s): "LIPASE", "AMYLASE" in the last 168 hours. No results for input(s): "AMMONIA" in the last 168 hours. Coagulation Profile: No results for input(s): "INR", "PROTIME" in the last 168 hours. Cardiac Enzymes: No results for input(s): "CKTOTAL", "CKMB", "CKMBINDEX", "TROPONINI" in the last 168 hours. BNP (last 3 results) No results for input(s): "PROBNP" in the last 8760 hours. HbA1C: No results for input(s): "HGBA1C" in the last 72 hours.  CBG: Recent Labs  Lab 08/22/22 0731 08/22/22 1155 08/22/22 1637 08/22/22 2047 08/23/22 0726  GLUCAP 68* 99 117* 173* 108*   Lipid Profile: No results for input(s): "CHOL", "HDL", "LDLCALC", "TRIG", "CHOLHDL", "LDLDIRECT" in the last 72 hours. Thyroid Function Tests: No results for input(s): "TSH", "T4TOTAL", "FREET4", "T3FREE", "THYROIDAB" in the last 72 hours. Anemia Panel: No results for input(s): "VITAMINB12", "FOLATE", "FERRITIN", "TIBC", "IRON", "RETICCTPCT" in the last 72 hours. Sepsis Labs: No results for input(s): "PROCALCITON", "LATICACIDVEN" in the last 168 hours.  No results found for this or any previous visit (from the past 240 hour(s)).       Radiology Studies: No results found.      Scheduled Meds:  albuterol  2.5 mg Nebulization TID   aspirin EC  81 mg Oral Daily   calcitRIOL  0.25 mcg Oral Daily   doxycycline  100 mg Oral Q12H   enoxaparin (LOVENOX) injection  70 mg Subcutaneous QHS   febuxostat  40 mg Oral Daily   fluticasone furoate-vilanterol  1 puff Inhalation Daily   And   umeclidinium bromide  1 puff Inhalation Daily   furosemide  40 mg Oral Daily   loratadine  10 mg Oral Daily   methylPREDNISolone (SOLU-MEDROL) injection  40 mg Intravenous Q12H   montelukast  10 mg Oral QHS   mouth rinse  15 mL Mouth  Rinse 4 times per day   Continuous Infusions:     LOS: 6 days   Time spent= 35 mins    Makaylin Carlo Arsenio Loader, MD [facility]  If 7PM-7AM, please contact night-coverage  08/23/2022, 11:24 AM

## 2022-08-24 ENCOUNTER — Ambulatory Visit: Payer: Medicare Other | Admitting: Internal Medicine

## 2022-08-24 DIAGNOSIS — J441 Chronic obstructive pulmonary disease with (acute) exacerbation: Secondary | ICD-10-CM | POA: Diagnosis not present

## 2022-08-24 LAB — BASIC METABOLIC PANEL
Anion gap: 9 (ref 5–15)
BUN: 69 mg/dL — ABNORMAL HIGH (ref 8–23)
CO2: 36 mmol/L — ABNORMAL HIGH (ref 22–32)
Calcium: 8.3 mg/dL — ABNORMAL LOW (ref 8.9–10.3)
Chloride: 90 mmol/L — ABNORMAL LOW (ref 98–111)
Creatinine, Ser: 1.45 mg/dL — ABNORMAL HIGH (ref 0.44–1.00)
GFR, Estimated: 39 mL/min — ABNORMAL LOW (ref >60)
Glucose, Bld: 139 mg/dL — ABNORMAL HIGH (ref 70–99)
Potassium: 5.5 mmol/L — ABNORMAL HIGH (ref 3.5–5.1)
Sodium: 135 mmol/L (ref 135–145)

## 2022-08-24 LAB — GLUCOSE, CAPILLARY
Glucose-Capillary: 124 mg/dL — ABNORMAL HIGH (ref 70–99)
Glucose-Capillary: 139 mg/dL — ABNORMAL HIGH (ref 70–99)
Glucose-Capillary: 114 mg/dL — ABNORMAL HIGH (ref 70–99)
Glucose-Capillary: 143 mg/dL — ABNORMAL HIGH (ref 70–99)

## 2022-08-24 LAB — MAGNESIUM: Magnesium: 2 mg/dL (ref 1.7–2.4)

## 2022-08-24 LAB — BRAIN NATRIURETIC PEPTIDE: B Natriuretic Peptide: 21.1 pg/mL (ref 0.0–100.0)

## 2022-08-24 MED ORDER — PREDNISONE 20 MG PO TABS
40.0000 mg | ORAL_TABLET | Freq: Every day | ORAL | Status: DC
  Administered 2022-08-25: 40 mg via ORAL
  Filled 2022-08-24: qty 2

## 2022-08-24 MED ORDER — SODIUM ZIRCONIUM CYCLOSILICATE 10 G PO PACK
10.0000 g | PACK | Freq: Two times a day (BID) | ORAL | Status: AC
  Administered 2022-08-24 (×2): 10 g via ORAL
  Filled 2022-08-24 (×2): qty 1

## 2022-08-24 NOTE — TOC Progression Note (Addendum)
Transition of Care [facility]) - Progression Note    Patient Details  Name: Pamela [name] MRN: [id number] Date of Birth: [birth year]  Transition of [facility]) CM/SW Contact  Caela Huot, Juliann Pulse, RN Phone Number: 08/24/2022, 12:25 PM  Clinical Narrative:  [place] following for HHPT/OT.Already active w/[facility] home 02.     Expected Discharge Plan: [place] Barriers to Discharge: Continued Medical Work up  Expected Discharge Plan and Services     Post Acute Care Choice: [place] arrangements for the past 2 months: [place]: PT [place]: [place] Date [place]: 08/24/22 Time [place]: 1224 Representative spoke with at [place]: [place] Determinants of Health ([place]) Interventions SDOH Screenings   Food Insecurity: No Food Insecurity (08/17/2022)  Housing: Low Risk  (08/17/2022)  Transportation Needs: No Transportation Needs (08/17/2022)  Utilities: Not At Risk (08/17/2022)  Alcohol Screen: Low Risk  (07/15/2022)  Depression (PHQ2-9): Low Risk  (07/28/2022)  Financial Resource Strain: Low Risk  (07/15/2022)  Physical Activity: Inactive (07/15/2022)  Social Connections: Moderately Integrated (07/15/2022)  Stress: No Stress Concern Present (07/15/2022)  Tobacco Use: Low Risk  (08/16/2022)    Readmission Risk Interventions    04/06/2022   12:12 PM  Readmission Risk Prevention Plan  Transportation Screening Complete  PCP or Specialist Appt within 5-7 Days Complete  Home Care Screening Complete  Medication Review (RN CM) Complete

## 2022-08-24 NOTE — Progress Notes (Signed)
Mobility Specialist - Progress Note  Pre-mobility: 80 bpm HR, 96% SpO2 During mobility: 94 bpm HR, 90% SpO2 Post-mobility: 86 bpm HR, 95% SPO2   08/24/22 1407  Oxygen Therapy  O2 Device Nasal Cannula  O2 Flow Rate (L/min) 2 L/min  Patient Activity (if Appropriate) Ambulating  Mobility  Activity Ambulated with assistance in hallway  Level of Assistance Standby assist, set-up cues, supervision of patient - no hands on  Assistive Device Front wheel walker  Distance Ambulated (ft) 500 ft  Range of Motion/Exercises Active  Activity Response Tolerated well  Mobility Referral Yes  $Mobility charge 1 Mobility   Pt was found on recliner chair and agreeable to ambulate. Had x2 brief standing rest breaks during session. At EOS returned to recliner chair with necessities in reach and RN in room.  Ferd Hibbs Mobility Specialist

## 2022-08-24 NOTE — Progress Notes (Signed)
PROGRESS NOTE    Pamela [name]  TML:[id number] DOB: 11/03/1950 DOA: 08/16/2022 PCP: Dorena Dew, FNP   Brief Narrative:  72 y.o. female past medical history significant for osteoarthritis, lung cancer in remission, chronic diastolic heart failure congenital single kidney, COPD on 2 L of oxygen at home, sarcoid comes in with dyspnea productive cough that started 3 days prior to admission, with an increase need and oxygen requirement, white count of 7.3 hemoglobin of 12 unremarkable BMP chest x-ray showed no acute cardiopulmonary disease     Assessment & Plan:  Principal Problem:   COPD with acute exacerbation ([place]) Active Problems:   Chronic diastolic CHF (congestive heart failure) (HCC)   Stage 3b chronic kidney disease ([place])   Prediabetes   Obstructive sleep apnea   Acute on chronic respiratory failure with hypoxia and hypercapnia (HCC)   Vitamin D deficiency   Acute respiratory failure with hypercapnia (HCC)    Acute respiratory failure with hypoxia and hypercarbia likely due to COPD exacerbation: Unfortunately she is still having bilateral rhonchi/abnormal breath sounds with dyspnea on exertion.  Continue supplemental oxygen, bronchodilators, I-S/flutter valve.  Completed doxycycline.  Transition IV Solu-Medrol to p.o. prednisone   Obstructive sleep apnea: Continue CPAP at night.   Chronic diastolic CHF (congestive heart failure) (HCC) BN P unremarkable, 2D echo showed an EF of 60% no wall motion abnormality. Continue Lasix 40 mg orally daily   Chronic kidney disease stage IIIb: Creatinine around baseline of 1.4.  Hyperkalemia/elevated BUN - Suspect secondary to steroids.  Morning potassium 5.5, repeat shows 4.9.  Will continue to monitor this.   Prediabetes mellitus: Hemoglobin A1c of 5.4 blood glucose is stable   History of lung cancer: Non-small cell cancer status post SBRT in 2012 in remission.  Hypomagnesemia-repletion   DVT prophylaxis: lovenox Family  Communication:none  The patient will require care spanning > 2 midnights and should be moved to inpatient because: Still with significant abnormal breath sounds, likely discharge in next 24 hours   Subjective: Still having cough and congestion with exertional dyspnea Examination: Constitutional: Not in acute distress Respiratory: Bilateral rhonchi with expiratory wheezing Cardiovascular: Normal sinus rhythm, no rubs Abdomen: Nontender nondistended good bowel sounds Musculoskeletal: No edema noted Skin: No rashes seen Neurologic: CN 2-12 grossly intact.  And nonfocal Psychiatric: Normal judgment and insight. Alert and oriented x 3. Normal mood.   Objective: Vitals:   08/23/22 1940 08/23/22 2113 08/24/22 0500 08/24/22 0737  BP:  121/64 (!) 123/58   Pulse:  73 88   Resp:  20 17   Temp:  98.5 F (36.9 C) 97.9 F (36.6 C)   TempSrc:  Oral Oral   SpO2: 90% 97% 94% 94%  Weight:      Height:        Intake/Output Summary (Last 24 hours) at 08/24/2022 1119 Last data filed at 08/24/2022 1000 Gross per 24 hour  Intake 1260 ml  Output 1901 ml  Net -641 ml   Filed Weights   08/16/22 1229 08/16/22 2304 08/22/22 0516  Weight: 136.1 kg (!) 138.9 kg (!) 143.7 kg     Data Reviewed:   CBC: Recent Labs  Lab 08/19/22 0916 08/22/22 0813  WBC 4.5 5.2  HGB 10.9* 12.2  HCT 37.0 41.0  MCV 74.4* 73.9*  PLT 162 681   Basic Metabolic Panel: Recent Labs  Lab 08/20/22 0510 08/21/22 0431 08/22/22 0813 08/23/22 0442 08/23/22 0903 08/24/22 0437  NA 138 139 139 136  --  135  K 4.8 5.0 4.2  5.5* 4.9 5.5*  CL 94* 93* 91* 91*  --  90*  CO2 34* 39* 38* 36*  --  36*  GLUCOSE 96 90 85 129*  --  139*  BUN 50* 52* 51* 62*  --  69*  CREATININE 1.53* 1.52* 1.51* 1.38*  --  1.45*  CALCIUM 7.6* 7.9* 8.2* 8.1*  --  8.3*  MG 1.4* 1.5* 2.3 2.0  --  2.0   GFR: Estimated Creatinine Clearance: 53.8 mL/min (A) (by C-G formula based on SCr of 1.45 mg/dL (H)). Liver Function Tests: No results for  input(s): "AST", "ALT", "ALKPHOS", "BILITOT", "PROT", "ALBUMIN" in the last 168 hours.  No results for input(s): "LIPASE", "AMYLASE" in the last 168 hours. No results for input(s): "AMMONIA" in the last 168 hours. Coagulation Profile: No results for input(s): "INR", "PROTIME" in the last 168 hours. Cardiac Enzymes: No results for input(s): "CKTOTAL", "CKMB", "CKMBINDEX", "TROPONINI" in the last 168 hours. BNP (last 3 results) No results for input(s): "PROBNP" in the last 8760 hours. HbA1C: No results for input(s): "HGBA1C" in the last 72 hours.  CBG: Recent Labs  Lab 08/23/22 0726 08/23/22 1130 08/23/22 1623 08/23/22 2110 08/24/22 0736  GLUCAP 108* 143* 150* 173* 124*   Lipid Profile: No results for input(s): "CHOL", "HDL", "LDLCALC", "TRIG", "CHOLHDL", "LDLDIRECT" in the last 72 hours. Thyroid Function Tests: No results for input(s): "TSH", "T4TOTAL", "FREET4", "T3FREE", "THYROIDAB" in the last 72 hours. Anemia Panel: No results for input(s): "VITAMINB12", "FOLATE", "FERRITIN", "TIBC", "IRON", "RETICCTPCT" in the last 72 hours. Sepsis Labs: No results for input(s): "PROCALCITON", "LATICACIDVEN" in the last 168 hours.  No results found for this or any previous visit (from the past 240 hour(s)).       Radiology Studies: No results found.      Scheduled Meds:  albuterol  2.5 mg Nebulization TID   aspirin EC  81 mg Oral Daily   calcitRIOL  0.25 mcg Oral Daily   doxycycline  100 mg Oral Q12H   enoxaparin (LOVENOX) injection  70 mg Subcutaneous QHS   febuxostat  40 mg Oral Daily   fluticasone furoate-vilanterol  1 puff Inhalation Daily   And   umeclidinium bromide  1 puff Inhalation Daily   furosemide  40 mg Oral Daily   loratadine  10 mg Oral Daily   methylPREDNISolone (SOLU-MEDROL) injection  40 mg Intravenous Q12H   montelukast  10 mg Oral QHS   mouth rinse  15 mL Mouth Rinse 4 times per day   sodium zirconium cyclosilicate  10 g Oral BID   Continuous  Infusions:     LOS: 7 days   Time spent= 35 mins    Ashleynicole Mcclees Arsenio Loader, MD [facility]  If 7PM-7AM, please contact night-coverage  08/24/2022, 11:19 AM

## 2022-08-25 ENCOUNTER — Ambulatory Visit: Payer: PRIVATE HEALTH INSURANCE | Admitting: Podiatry

## 2022-08-25 DIAGNOSIS — J441 Chronic obstructive pulmonary disease with (acute) exacerbation: Secondary | ICD-10-CM | POA: Diagnosis not present

## 2022-08-25 LAB — MAGNESIUM: Magnesium: 2.1 mg/dL (ref 1.7–2.4)

## 2022-08-25 LAB — GLUCOSE, CAPILLARY
Glucose-Capillary: 75 mg/dL (ref 70–99)
Glucose-Capillary: 112 mg/dL — ABNORMAL HIGH (ref 70–99)
Glucose-Capillary: 131 mg/dL — ABNORMAL HIGH (ref 70–99)

## 2022-08-25 LAB — CBC
HCT: 38.8 % (ref 36.0–46.0)
Hemoglobin: 11.8 g/dL — ABNORMAL LOW (ref 12.0–15.0)
MCH: 21.7 pg — ABNORMAL LOW (ref 26.0–34.0)
MCHC: 30.4 g/dL (ref 30.0–36.0)
MCV: 71.5 fL — ABNORMAL LOW (ref 80.0–100.0)
Platelets: 160 10*3/uL (ref 150–400)
RBC: 5.43 MIL/uL — ABNORMAL HIGH (ref 3.87–5.11)
RDW: 15.4 % (ref 11.5–15.5)
WBC: 5.5 10*3/uL (ref 4.0–10.5)
nRBC: 0 % (ref 0.0–0.2)

## 2022-08-25 LAB — BASIC METABOLIC PANEL
Anion gap: 11 (ref 5–15)
BUN: 66 mg/dL — ABNORMAL HIGH (ref 8–23)
CO2: 34 mmol/L — ABNORMAL HIGH (ref 22–32)
Calcium: 8.4 mg/dL — ABNORMAL LOW (ref 8.9–10.3)
Chloride: 91 mmol/L — ABNORMAL LOW (ref 98–111)
Creatinine, Ser: 1.45 mg/dL — ABNORMAL HIGH (ref 0.44–1.00)
GFR, Estimated: 39 mL/min — ABNORMAL LOW (ref >60)
Glucose, Bld: 88 mg/dL (ref 70–99)
Potassium: 5 mmol/L (ref 3.5–5.1)
Sodium: 136 mmol/L (ref 135–145)

## 2022-08-25 MED ORDER — FERROUS SULFATE 325 (65 FE) MG PO TABS
325.0000 mg | ORAL_TABLET | Freq: Every day | ORAL | Status: DC
  Administered 2022-08-25: 325 mg via ORAL
  Filled 2022-08-25: qty 1

## 2022-08-25 MED ORDER — FERROUS SULFATE 325 (65 FE) MG PO TABS: 325.0000 mg | ORAL_TABLET | Freq: Every day | ORAL | 0 refills | Status: DC

## 2022-08-25 MED ORDER — PREDNISONE 20 MG PO TABS: 40.0000 mg | ORAL_TABLET | Freq: Every day | ORAL | 0 refills | Status: AC

## 2022-08-25 MED ORDER — IRON 325 (65 FE) MG PO TABS: ORAL_TABLET | Freq: Every day | ORAL | Status: DC

## 2022-08-25 NOTE — Progress Notes (Incomplete)
Discharge teaching provided to patient.  Belongings returned, including cane, clothing, and cell phone.

## 2022-08-25 NOTE — Discharge Summary (Signed)
Physician Discharge Summary  Pamela [name] ZOX:[id number] DOB: Oct 12, 1950 DOA: 08/16/2022  PCP: Dorena Dew, FNP  Admit date: 08/16/2022 Discharge date: 08/25/2022  Admitted From: Home Disposition: Home with home health  Recommendations for Outpatient Follow-up:  Follow up with PCP in 1-2 weeks Please obtain BMP/CBC in one week your next doctors visit.  Prednisone and iron tablets prescribed.  Advised to follow-up outpatient pulmonary next 5 to 7 days. She tells me she is enough bronchodilators at home. Benefit from outpatient sleep study  Discharge Condition: Stable CODE STATUS: Full code Diet recommendation: Heart healthy  Brief/Interim Summary: 72 y.o. 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  Over the course of several days patient started doing better, she completed course of doxycycline in the hospital.  IV Solu-Medrol was transitioned to p.o. prednisone. PT/OT recommended home health therefore arrangements were made.       Assessment & Plan:  Principal Problem:   COPD with acute exacerbation ([place]) Active Problems:   Chronic diastolic CHF (congestive heart failure) (HCC)   Stage 3b chronic kidney disease (HCC)   Prediabetes   Obstructive sleep apnea   Acute on chronic respiratory failure with hypoxia and hypercapnia (HCC)   Vitamin D deficiency   Acute respiratory failure with hypercapnia (HCC)     Acute respiratory failure with hypoxia and hypercarbia likely due to COPD exacerbation: Overall patient was very slow to improve and today her breath sounds are much clear.  She has completed course of  doxycycline.  Transition to p.o. prednisone for 3 more days.  Continue using home bronchodilators, she tells me she has enough supplies at home.  I have advised she follow-up outpatient with her pulmonary next 5 to 7 days.   Obstructive sleep apnea: Continue CPAP at night.   Chronic diastolic CHF (congestive heart failure) (HCC) BN P unremarkable, 2D echo showed an EF of 60% no wall motion abnormality. Continue Lasix 40 mg orally daily   Chronic kidney disease stage IIIb: Microcytic anemia Creatinine around baseline of 1.4.  On daily iron at home   Hyperkalemia/elevated BUN - Suspect from steroid use but this has improved.   Prediabetes mellitus: Hemoglobin A1c of 5.4 blood glucose is stable   History of lung cancer: Non-small cell cancer status post SBRT in 2012 in remission.   Hypomagnesemia-repletion   Consultations: None  Subjective: Feels great and wishes to go home today.  Discharge Exam: Vitals:   08/25/22 0458 08/25/22 0819  BP: 138/73   Pulse: 66   Resp: 20   Temp: 98.4 F (36.9 C)   SpO2: 100% 98%   Vitals:   08/24/22 2101 08/25/22 0458 08/25/22 0500 08/25/22 0819  BP: 117/63 138/73    Pulse: 67 66    Resp: 18 20    Temp: 98.7 F (37.1 C) 98.4 F (36.9 C)    TempSrc: Oral Oral    SpO2: 98% 100%  98%  Weight:   (!) 141.5 kg   Height:        General: Pt is alert, awake, not in acute distress, morbid obesity Cardiovascular: RRR, S1/S2 +, no rubs, no gallops Respiratory: CTA bilaterally, no wheezing, no rhonchi Abdominal: Soft, NT,  ND, bowel sounds + Extremities: no edema, no cyanosis  Discharge Instructions   Allergies as of 08/25/2022       Reactions   Sulfa Antibiotics Hives, Rash        Medication List     TAKE these medications    acetaminophen 500 MG tablet Commonly known as: TYLENOL Take 1,000 mg by mouth every 6 (six) hours as needed for mild pain.   albuterol (2.5 MG/3ML) 0.083% nebulizer solution Commonly known as:  PROVENTIL USE 1 VIAL IN NEBULIZER EVERY 6 HOURS AS NEEDED FOR WHEEZING FOR SHORTNESS OF BREATH What changed:  how much to take how to take this when to take this reasons to take this additional instructions   albuterol 108 (90 Base) MCG/ACT inhaler Commonly known as: ProAir HFA Inhale 2 puffs into the lungs every 4 (four) hours as needed for wheezing or shortness of breath. What changed: Another medication with the same name was changed. Make sure you understand how and when to take each.   aspirin EC 81 MG tablet Take 1 tablet (81 mg total) by mouth daily. Swallow whole.   calcitRIOL 0.25 MCG capsule Commonly known as: ROCALTROL Take 1 capsule (0.25 mcg total) by mouth daily.   cetirizine 10 MG chewable tablet Commonly known as: ZYRTEC Chew 10 mg by mouth daily.   Depend Underwear Large/XL Misc 1 each by Does not apply route 2 times daily at 12 noon and 4 pm.   febuxostat 40 MG tablet Commonly known as: ULORIC Take 1 tablet (40 mg total) by mouth daily.   ferrous sulfate 325 (65 FE) MG tablet Take 1 tablet (325 mg total) by mouth daily with lunch. What changed:  medication strength how much to take when to take this   furosemide 40 MG tablet Commonly known as: LASIX Take 1 tablet (40 mg total) by mouth daily.   montelukast 10 MG tablet Commonly known as: SINGULAIR Take 1 tablet (10 mg total) by mouth at bedtime.   predniSONE 20 MG tablet Commonly known as: DELTASONE Take 2 tablets (40 mg total) by mouth daily with breakfast for 3 days. Start taking on: August 26, 2022   Stool Softener 100 MG capsule Generic drug: docusate sodium Take 100 mg by mouth daily.   Trelegy Ellipta 200-62.5-25 MCG/ACT Aepb Generic drug: Fluticasone-Umeclidin-Vilant Inhale 1 puff into the lungs daily.        Follow-up Information     [facility] Follow up.   Specialty: [place] Why: [facility] physical therapy/occupational therapy Contact information: [place] [address] [phone]         Dorena Dew, FNP Follow up in 1 week(s).   Specialty: Family Medicine Contact information: [place]. [place] [address] [phone]                Allergies  Allergen Reactions   Sulfa Antibiotics Hives and Rash    You were cared for by a hospitalist during your hospital stay. If you have any questions about your discharge medications or the care you received while you were in the hospital after you are discharged, you can call the unit and asked to speak with the hospitalist on call if the hospitalist that took care of you is not available. Once you are discharged, your primary care physician will handle any further medical issues. Please note that no refills for any discharge medications will be authorized once you are discharged, as  it is imperative that you return to your primary care physician (or establish a relationship with a primary care physician if you do not have one) for your aftercare needs so that they can reassess your need for medications and monitor your lab values.   Procedures/Studies: ECHOCARDIOGRAM COMPLETE  Result Date: 08/17/2022    ECHOCARDIOGRAM REPORT   Patient Name:   [facility] Date of Exam: 08/17/2022 Medical Rec #:  [id number]      Height:       66.0 in Accession #:    [id number]     Weight:       306.2 lb Date of Birth:  1951/06/24      BSA:          2.397 m Patient Age:    72 years       BP:           126/82 mmHg Patient Gender: F              HR:           73 bpm. Exam Location:  Inpatient Procedure: 2D Echo and Intracardiac Opacification Agent Indications:    CHF  History:        Patient has prior history of Echocardiogram examinations, most                 recent 09/06/2018. CHF, COPD; Risk Factors:Hypertension.  Sonographer:    Harvie Junior Referring Phys: [id number]  Sonographer Comments: Technically difficult study due to poor echo windows,  patient is obese and no subcostal window. Image acquisition challenging due to patient body habitus, Image acquisition challenging due to COPD and supine. IMPRESSIONS  1. Left ventricular ejection fraction, by estimation, is 60 to 65%. The left ventricle has normal function. The left ventricle has no regional wall motion abnormalities. Left ventricular diastolic parameters were normal.  2. Right ventricular systolic function is normal. The right ventricular size is normal. There is normal pulmonary artery systolic pressure.  3. The mitral valve is normal in structure. No evidence of mitral valve regurgitation. No evidence of mitral stenosis.  4. The aortic valve is normal in structure. Aortic valve regurgitation is not visualized. No aortic stenosis is present.  5. The inferior vena cava is normal in size with greater than 50% respiratory variability, suggesting right atrial pressure of 3 mmHg. FINDINGS  Left Ventricle: Left ventricular ejection fraction, by estimation, is 60 to 65%. The left ventricle has normal function. The left ventricle has no regional wall motion abnormalities. Definity contrast agent was given IV to delineate the left ventricular  endocardial borders. The left ventricular internal cavity size was normal in size. There is no left ventricular hypertrophy. Left ventricular diastolic parameters were normal. Right Ventricle: The right ventricular size is normal. No increase in right ventricular wall thickness. Right ventricular systolic function is normal. There is normal pulmonary artery systolic pressure. The tricuspid regurgitant velocity is 2.87 m/s, and  with an assumed right atrial pressure of 3 mmHg, the estimated right ventricular systolic pressure is 46.9 mmHg. Left Atrium: Left atrial size was normal in size. Right Atrium: Right atrial size was normal in size. Pericardium: There is no evidence of pericardial effusion. Mitral Valve: The mitral valve is normal in structure. No evidence of  mitral valve regurgitation. No evidence of mitral valve stenosis. Tricuspid Valve: The tricuspid valve is normal in structure. Tricuspid valve regurgitation is not demonstrated. No evidence of tricuspid stenosis. Aortic Valve: The aortic valve is  normal in structure. Aortic valve regurgitation is not visualized. No aortic stenosis is present. Aortic valve mean gradient measures 2.0 mmHg. Aortic valve peak gradient measures 4.4 mmHg. Aortic valve area, by VTI measures 3.26 cm. Pulmonic Valve: The pulmonic valve was normal in structure. Pulmonic valve regurgitation is not visualized. No evidence of pulmonic stenosis. Aorta: The aortic root is normal in size and structure. Venous: The inferior vena cava is normal in size with greater than 50% respiratory variability, suggesting right atrial pressure of 3 mmHg. IAS/Shunts: No atrial level shunt detected by color flow Doppler.  LEFT VENTRICLE PLAX 2D LVIDd:         4.80 cm   Diastology LVIDs:         3.00 cm   LV e' medial:    8.70 cm/s LV PW:         0.80 cm   LV E/e' medial:  9.0 LV IVS:        0.70 cm   LV e' lateral:   9.90 cm/s LVOT diam:     1.80 cm   LV E/e' lateral: 7.9 LV SV:         68 LV SV Index:   28 LVOT Area:     2.54 cm  RIGHT VENTRICLE RV S prime:     17.00 cm/s TAPSE (M-mode): 2.1 cm LEFT ATRIUM           Index LA diam:      3.40 cm 1.42 cm/m LA Vol (A4C): 41.9 ml 17.48 ml/m  AORTIC VALVE                    PULMONIC VALVE AV Area (Vmax):    2.52 cm     PV Vmax:       1.11 m/s AV Area (Vmean):   2.85 cm     PV Peak grad:  4.9 mmHg AV Area (VTI):     3.26 cm AV Vmax:           105.00 cm/s AV Vmean:          65.500 cm/s AV VTI:            0.209 m AV Peak Grad:      4.4 mmHg AV Mean Grad:      2.0 mmHg LVOT Vmax:         104.00 cm/s LVOT Vmean:        73.400 cm/s LVOT VTI:          0.268 m LVOT/AV VTI ratio: 1.28  AORTA Ao Root diam: 3.40 cm Ao Asc diam:  3.40 cm MITRAL VALVE               TRICUSPID VALVE MV Area (PHT): 3.91 cm    TR Peak grad:    32.9 mmHg MV Decel Time: 194 msec    TR Vmax:        287.00 cm/s MV E velocity: 78.40 cm/s MV A velocity: 87.40 cm/s  SHUNTS MV E/A ratio:  0.90        Systemic VTI:  0.27 m                            Systemic Diam: 1.80 cm Aditya Sabharwal Electronically signed by Hebert Soho Signature Date/Time: 08/17/2022/5:38:14 PM    Final    DG Chest 2 View  Result Date: 08/16/2022 CLINICAL DATA:  Shortness of breath EXAM: CHEST - 2 VIEW  COMPARISON:  Chest CT April 04, 2022 and chest radiograph April 03, 2022. FINDINGS: Left anterior chest cardiac loop recording device. The heart size and mediastinal contours are unchanged. Similar right upper lobe scarring. Hazy opacities in the lung bases are favored artifactual secondary to technique. No visible pleural effusion or pneumothorax. Thoracic spondylosis. IMPRESSION: No convincing evidence of acute cardiopulmonary disease. Electronically Signed   By: Dahlia Bailiff M.D.   On: 08/16/2022 13:08     The results of significant diagnostics from this hospitalization (including imaging, microbiology, ancillary and laboratory) are listed below for reference.     Microbiology: No results found for this or any previous visit (from the past 240 hour(s)).   Labs: BNP (last 3 results) Recent Labs    04/03/22 2058 08/16/22 1232 08/24/22 0437  BNP 22.4 37.9 82.5   Basic Metabolic Panel: Recent Labs  Lab 08/21/22 0431 08/22/22 0813 08/23/22 0442 08/23/22 0903 08/24/22 0437 08/25/22 0449  NA 139 139 136  --  135 136  K 5.0 4.2 5.5* 4.9 5.5* 5.0  CL 93* 91* 91*  --  90* 91*  CO2 39* 38* 36*  --  36* 34*  GLUCOSE 90 85 129*  --  139* 88  BUN 52* 51* 62*  --  69* 66*  CREATININE 1.52* 1.51* 1.38*  --  1.45* 1.45*  CALCIUM 7.9* 8.2* 8.1*  --  8.3* 8.4*  MG 1.5* 2.3 2.0  --  2.0 2.1   Liver Function Tests: No results for input(s): "AST", "ALT", "ALKPHOS", "BILITOT", "PROT", "ALBUMIN" in the last 168 hours. No results for input(s): "LIPASE",  "AMYLASE" in the last 168 hours. No results for input(s): "AMMONIA" in the last 168 hours. CBC: Recent Labs  Lab 08/19/22 0916 08/22/22 0813 08/25/22 0449  WBC 4.5 5.2 5.5  HGB 10.9* 12.2 11.8*  HCT 37.0 41.0 38.8  MCV 74.4* 73.9* 71.5*  PLT 162 159 160   Cardiac Enzymes: No results for input(s): "CKTOTAL", "CKMB", "CKMBINDEX", "TROPONINI" in the last 168 hours. BNP: Invalid input(s): "POCBNP" CBG: Recent Labs  Lab 08/24/22 0736 08/24/22 1126 08/24/22 1639 08/24/22 2048 08/25/22 0737  GLUCAP 124* 139* 114* 143* 75   D-Dimer No results for input(s): "DDIMER" in the last 72 hours. Hgb A1c No results for input(s): "HGBA1C" in the last 72 hours. Lipid Profile No results for input(s): "CHOL", "HDL", "LDLCALC", "TRIG", "CHOLHDL", "LDLDIRECT" in the last 72 hours. Thyroid function studies No results for input(s): "TSH", "T4TOTAL", "T3FREE", "THYROIDAB" in the last 72 hours.  Invalid input(s): "FREET3" Anemia work up No results for input(s): "VITAMINB12", "FOLATE", "FERRITIN", "TIBC", "IRON", "RETICCTPCT" in the last 72 hours. Urinalysis    Component Value Date/Time   COLORURINE YELLOW 07/28/2019 2033   APPEARANCEUR CLEAR 07/28/2019 2033   LABSPEC 1.020 07/28/2022 1320   PHURINE 6.0 07/28/2019 2033   GLUCOSEU NEGATIVE 07/28/2019 2033   HGBUR NEGATIVE 07/28/2019 2033   BILIRUBINUR negative 07/28/2022 1320   BILIRUBINUR NEGATIVE 04/21/2022 1224   KETONESUR negative 07/28/2022 [place] 07/28/2019 2033   PROTEINUR negative 07/28/2022 1320   PROTEINUR Negative 04/21/2022 1224   PROTEINUR NEGATIVE 07/28/2019 2033   UROBILINOGEN 0.2 04/21/2022 1224   UROBILINOGEN 0.2 09/24/2017 1143   NITRITE Negative 07/28/2022 1320   NITRITE NEGATIVE 04/21/2022 1224   NITRITE NEGATIVE 07/28/2019 2033   LEUKOCYTESUR Negative 07/28/2022 1320   LEUKOCYTESUR NEGATIVE 07/28/2019 2033   Sepsis Labs Recent Labs  Lab 08/19/22 0916 08/22/22 0813 08/25/22 0449  WBC 4.5  5.2 5.5  Microbiology No results found for this or any previous visit (from the past 240 hour(s)).   Time coordinating discharge:  I have spent 35 minutes face to face with the patient and on the ward discussing the patients care, assessment, plan and disposition with other care givers. >50% of the time was devoted counseling the patient about the risks and benefits of treatment/Discharge disposition and coordinating care.   SIGNED:   Damita Lack, MD  [facility] 08/25/2022, 10:52 AM   If 7PM-7AM, please contact night-coverage

## 2022-08-26 ENCOUNTER — Telehealth: Payer: Self-pay | Admitting: *Deleted

## 2022-08-26 ENCOUNTER — Encounter: Payer: Self-pay | Admitting: *Deleted

## 2022-08-26 NOTE — Progress Notes (Unsigned)
Subjective:    Patient ID: Pamela [name], female    DOB: Feb 12, 1951  MRN: [id number]  Brief patient profile:  31 yobf never smoker with 1st asthma attack in 1990s and on maint rx since late 90's  and freq courses of prednisone=  3-4 x per year despite advair and spiriva and freq saba referred to pulmonary clinic 04/27/2014 by Dr [name] s/p CT Bx 06/25/11 > no path report epic  but per oncology = [id number] adenoca of lung >  RT only per pt RUL completed 09/2011.     History of Present Illness  04/27/2014 1st [facility] office visit/ Pamela [name]   Chief Complaint  Patient presents with   Pulmonary Consult    Referred by Dr. [name]. [name]  Pt recently moved to [place] from [name] and states needs to establish with new pulmonary MD.  She c/o DOE and cough, and states that she feels these symptoms are currently under control.    on prednisone 10 mg daily  since late July 2015 and still on freq saba and 2lpm  Already used   2puffs proair am of ov  rec Stop spiriva and advair Start dulera 100 Take 2 puffs first thing in am and then another 2 puffs about 12 hours later.  Work on inhaler technique:  relax and gently blow all the way out then take a nice smooth deep breath back in, triggering the inhaler at same time you start breathing in.  Hold for up to 5 seconds if you can.  Rinse and gargle with water when done Only use your albuterol (proair) as a rescue medication  If proair not helping, then use the neb and if needing the neb more than occastional, then take prednisone 10 mg daily  Please schedule a follow up office visit in 4 weeks, sooner if needed with all inhalers in hand for pfts on return    Admit date: 08/16/2022 Discharge date: 08/25/2022      Recommendations for Outpatient Follow-up:  Follow up with PCP in 1-2 weeks Please obtain BMP/CBC in one week your next doctors visit.  Prednisone and iron tablets prescribed.   Advised to follow-up outpatient pulmonary next 5 to 7 days. She tells me she is enough bronchodilators at home. 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  Over the course of several days patient started doing better, she completed course of doxycycline in the hospital.  IV Solu-Medrol was transitioned to p.o. prednisone. PT/OT recommended home health therefore arrangements were made.       Assessment & Plan:  Principal Problem:   COPD with acute exacerbation (HCC)   Chronic diastolic CHF (congestive heart failure) (HCC)   Stage 3b chronic kidney disease ([place])   Prediabetes   Obstructive sleep apnea   Acute on chronic respiratory failure with hypoxia and hypercapnia (HCC)   Vitamin D deficiency   Acute respiratory failure with hypercapnia (HCC)     Acute respiratory failure with hypoxia and hypercarbia likely due to COPD exacerbation: Overall patient was very slow to  improve and today her breath sounds are much clear.  She has completed course of doxycycline.  Transition to p.o. prednisone for 3 more days.  Continue using home bronchodilators, she tells me she has enough supplies at home.  I have advised she follow-up outpatient with her pulmonary next 5 to 7 days.     Chronic diastolic CHF (congestive heart failure) (HCC) BN P unremarkable, 2D echo showed an EF of 60% no wall motion abnormality. Continue Lasix 40 mg orally daily   Chronic kidney disease stage IIIb:  Creatinine around baseline of 1.4.  On daily iron at home        08/27/2022  [name]  re: asthma/MO   maint on Trelelgy  200 finished prednisone/ refuses sleep aid other 02   Chief Complaint  Patient presents with   Hospitalization Follow-up    COPD exacerbation.  Patient states she is doing much better.  Improved SOB, Cough and wheeze  Dyspnea:  [employer] walking  baseline with care/ now use rollator due to balance  Cough: none though freq throat clearing  Sleeping: flat bed 4 pillows  SABA use: not using cpap and refuses to consider it again  02: 3lpm 24/7 was 2  Covid status:   vax x for last one,  had the infection 2020      No obvious day to day or daytime variability or assoc excess/ purulent sputum or mucus plugs or hemoptysis or cp or chest tightness, subjective wheeze or overt sinus or hb symptoms.   Sleeping  without nocturnal  or early am exacerbation  of respiratory  c/o's or need for noct saba. Also denies any obvious fluctuation of symptoms with weather or environmental changes or other aggravating or alleviating factors except as outlined above   No unusual exposure hx or h/o childhood pna/ asthma or knowledge of premature birth.  Current Allergies, Complete Past Medical History, Past Surgical History, Family History, and Social History were reviewed in [employer] record.  ROS  The following are not active complaints unless bolded Hoarseness, sore throat, dysphagia, dental problems, itching, sneezing,  nasal congestion or discharge of excess mucus or purulent secretions, ear ache,   fever, chills, sweats, unintended wt loss or wt gain, classically pleuritic or exertional cp,  orthopnea pnd or arm/hand swelling  or leg swelling, presyncope, palpitations, abdominal pain, anorexia, nausea, vomiting, diarrhea  or change in bowel habits or change in bladder habits, change in stools or change in urine, dysuria, hematuria,  rash, arthralgias, visual complaints, headache, numbness, weakness or ataxia or problems with walking or coordination,  change  in mood or  memory.        Current Meds  Medication Sig   acetaminophen (TYLENOL) 500 MG tablet Take 1,000 mg by mouth every 6 (six) hours as needed for mild pain.   albuterol (PROAIR HFA) 108 (90 Base) MCG/ACT inhaler Inhale 2 puffs into the lungs every 4 (four) hours as needed for wheezing or shortness of breath.   albuterol (PROVENTIL) (2.5 MG/3ML) 0.083% nebulizer solution USE 1 VIAL IN NEBULIZER EVERY 6 HOURS AS NEEDED FOR WHEEZING FOR SHORTNESS OF BREATH (Patient taking differently: Take 2.5 mg by nebulization every 6 (six) hours as needed for shortness of breath.)   aspirin EC 81 MG tablet Take 1 tablet (81 mg total) by mouth daily. Swallow whole.   calcitRIOL (ROCALTROL) 0.25 MCG capsule Take 1 capsule (0.25 mcg total) by mouth daily.   cetirizine (ZYRTEC) 10 MG chewable tablet  Chew 10 mg by mouth daily.   docusate sodium (STOOL SOFTENER) 100 MG capsule Take 100 mg by mouth daily.   febuxostat (ULORIC) 40 MG tablet Take 1 tablet (40 mg total) by mouth daily.   ferrous sulfate 325 (65 FE) MG tablet Take 1 tablet (325 mg total) by mouth daily with lunch.   Fluticasone-Umeclidin-Vilant (TRELEGY ELLIPTA) 200-62.5-25 MCG/ACT AEPB Inhale 1 puff into the lungs daily.   furosemide (LASIX) 40 MG tablet Take 1 tablet (40 mg total) by mouth daily.   Incontinence Supply Disposable (DEPEND UNDERWEAR LARGE/XL) MISC 1 each by Does not apply route 2 times daily at 12 noon and 4 pm.   montelukast (SINGULAIR) 10 MG tablet Take 1 tablet (10 mg total) by mouth at bedtime.   predniSONE (DELTASONE) 20 MG tablet Take 2 tablets (40 mg total) by mouth daily with breakfast for 3 days.             Objective:   Physical Exam  Wts  08/27/2022     314  11/12/2021   354  11/12/2020   342 05/13/2020 325  04/01/2020  330   02/23/2019    354   08/30/2018  366   08/02/2018  360   11/30/2014   343   Vital signs reviewed  08/27/2022  - Note at rest 02 sats  97% on 3lpm POC   General appearance:    morbidly obese  (by bmi) bf using rollator/ insp upper airway "wheeze"    HEENT : Oropharynx  clear     Nasal turbinates nl    NECK :  without  apparent JVD/ palpable Nodes/TM    LUNGS: no acc muscle use,  Nl contour chest which is clear to A and P bilaterally without cough on insp or exp maneuvers   CV:  RRR  no s3 or murmur or increase in P2   ABD:  soft and nontender with nl inspiratory excursion in the supine position. No bruits or organomegaly appreciated   MS:  Nl gait/ ext warm without deformities Or obvious joint restrictions  calf tenderness, cyanosis or clubbing    SKIN: warm and dry with severe elephantiasis changes both LE's/ wrapped   NEURO:  alert, approp, nl sensorium with  no motor or cerebellar deficits apparent.       I personally reviewed images and agree with radiology impression as follows:  CXR:   pa and lateral 08/16/22 No convincing evidence of acute cardiopulmonary disease.

## 2022-08-26 NOTE — Progress Notes (Signed)
  Care Coordination  Note  08/26/2022 Name: Jorene Kaylor MRN: [id number] DOB: 1951-05-03  Meaghann Choo is a 72 y.o. year old primary care patient of Dorena Dew, FNP.   Follow up plan: Hospital Follow Up appointment scheduled with [name], [place]) on (09/08/2022) at (1pm).  Julian Hy, [place] Direct Dial: [phone]

## 2022-08-26 NOTE — Patient Outreach (Signed)
Care Coordination [facility] Note Transition Care Management Follow-up Telephone Call Date of discharge and from where: Tuesday, 08/25/22 [facility]; COPD exacerbation; Acute on chronic Respiratory Failure with hypoxia and hypercarbia How have you been since you were released from the hospital? "I am doing great; I am planning on picking up the prednisone and the iron from [facility] today; I will call them to make sure they are ready.  I am going to see Dr. Melvyn Novas tomorrow; I really only want to see my regular PCP, so schedule me for the 09/08/22 appointment you have offered me.  I use public transportation and am used to using it, it works well for me.  I will call [facility] home [facility] if I don't hear from them in the couple of days.  I am able to take care of myself, but I live with my son, he will help me if I need anything.  I have my home oxygen and my nebulizer and am using them like I am supposed to" Any questions or concerns? Yes -- has not yet obtained newly prescribed prednisone/ iron: reports they have been called in to outpatient pharmacy and she will pick up and start taking today -- no hospital follow up visit with PCP scheduled: care coordination outreach in real-time with scheduling care guide team; facilitated patient obtaining appointment on 09/08/22 at 1:00 pm- suggested she discuss with pulmonary specialist recommended lab work being obtained during tomorrow's office visit- she is agreeable -- scheduled with RN CM Care Coordinator for next week on 09/02/22 for ongoing care coordination needs assessment  Items Reviewed: Did the pt receive and understand the discharge instructions provided? Yes  thoroughly reviewed hospital discharge instructions/ AVS with patient who verbalizes good understanding of same today Medications obtained and verified? Yes  full medication review completed. As below/ intervention section Other? No  Any new allergies since your discharge? No  Dietary orders  reviewed? Yes Do you have support at home? Yes  patient reports she is independent in self-care activities; reports she resides with son/ daughter-in-law, who assist as/ if needed/ indicated  Home Care and Equipment/Supplies: Were home health services ordered? yes If so, what is the name of the agency? [facility]- PT/ OT: ensured patient has contact information for home health agency  Has the agency set up a time to come to the patient's home? No "not yet;" patient verbalizes plans to call tomorrow afternoon after scheduled pulmonary provider office visit, if she does not hear from agency by then- this was recommended; provided my direct contact information should she need assistance with initiating home health services Were any new equipment or medical supplies ordered?  No What is the name of the medical supply agency? N/A Were you able to get the supplies/equipment? not applicable Do you have any questions related to the use of the equipment or supplies? No N/A  Functional Questionnaire: (I = Independent and D = Dependent) ADLs: I  Bathing/Dressing- I  Meal Prep- I  Eating- I  Maintaining continence- I  Transferring/Ambulation- I  Managing Meds- I  Follow up appointments reviewed:  PCP Hospital f/u appt confirmed? Yes  Scheduled to see PCP on Tuesday 09/08/22 @ 1:00 pm: scheduled in real time during [facility] call today, facilitated by scheduling care guide team Specialist Hospital f/u appt confirmed? Yes  Scheduled to see pulmonary provider on tomorrow 08/27/22 @ 10:15 am Are transportation arrangements needed? No  If their condition worsens, is the pt aware to call PCP or go to  the Emergency Dept.? Yes Was the patient provided with contact information for the PCP's office or ED? No patient declined- reports already has contact information for care providers Was to pt encouraged to call back with questions or concerns? Yes provided my direct contact information should questions/  concerns/ needs arise prior to scheduled RN CM Care Coordinator telephone visit on 09/02/22  SDOH assessments and interventions completed:   Yes SDOH Interventions Today    Flowsheet Row Most Recent Value  SDOH Interventions   Food Insecurity Interventions Intervention Not Indicated  Transportation Interventions Intervention Not Indicated  [reports is established with public transportation and uses regularly,  denies additional transportation needs]      Interventions Today    Flowsheet Row Most Recent Value  Chronic Disease Discussed/Reviewed   Chronic disease discussed/reviewed during today's visit Chronic Obstructive Pulmonary Disease (COPD)  General Interventions   General Interventions Discussed/Reviewed General Interventions Discussed, Doctor Visits, Referral to Nurse  [facility] Coordination in real-time to facilitate scheduling of [place] PCP appointment: seeing pulmonary sepcialist tomorrow 08/27/22,  scheduled with PCP 09/08/22- first available,  scheduled follow up care cooridnation telephone visit with RN CM for 09/02/22]  Doctor Visits Discussed/Reviewed Doctor Visits Discussed, PCP, Specialist  PCP/Specialist Visits Compliance with follow-up visit  Exercise Interventions   Exercise Discussed/Reviewed Exercise Discussed, Assistive device use and maintanence  [importance of ensuring home health services for PT/OT,  confirmed patient using home O2 as per baseline]  Education Interventions   Education Provided Provided Verbal Education  [need to discuss possibility of lab draw with pulmonary specialists during tomorrow's scheduled appointment]  Provided Verbal Education On When to see the doctor  [importance of attending all provider appointments after recent hospitalization]  Nutrition Interventions   Nutrition Discussed/Reviewed Nutrition Discussed  Pharmacy Interventions   Pharmacy Dicussed/Reviewed Pharmacy Topics Discussed  [Full medication review completed,  patient reports she is to  pick up newly prescribed medications from OP pharmacy today,  no other concerns/ discrepancies identified,  medication list updated with notes,  self-manages medications]      Care Coordination Interventions:  PCP follow up appointment requested Referred for Care Coordination Services:  RN Care Coordinator Full medication review completed; provided education around importance of participating in home health services; provided education around need for prompt lab work after recent hospitalization; care coordination outreach with RN CM as Juluis Rainier    Encounter Outcome:  Pt. Visit Completed    Oneta Rack, RN, BSN, CCRN Alumnus RN CM Care Coordination/ Transition of [place] Management [phone]: direct office

## 2022-08-27 ENCOUNTER — Encounter: Payer: Self-pay | Admitting: Internal Medicine

## 2022-08-27 ENCOUNTER — Ambulatory Visit: Payer: Medicare HMO | Admitting: Internal Medicine

## 2022-08-27 VITALS — BP 122/64 | HR 72 | Temp 98.1°F | Ht 66.0 in | Wt 314.4 lb

## 2022-08-27 DIAGNOSIS — J4540 Moderate persistent asthma, uncomplicated: Secondary | ICD-10-CM

## 2022-08-27 DIAGNOSIS — J9611 Chronic respiratory failure with hypoxia: Secondary | ICD-10-CM

## 2022-08-27 DIAGNOSIS — J9612 Chronic respiratory failure with hypercapnia: Secondary | ICD-10-CM

## 2022-08-27 LAB — BASIC METABOLIC PANEL
BUN: 59 mg/dL — ABNORMAL HIGH (ref 6–23)
CO2: 34 mEq/L — ABNORMAL HIGH (ref 19–32)
Calcium: 8.9 mg/dL (ref 8.4–10.5)
Chloride: 94 mEq/L — ABNORMAL LOW (ref 96–112)
Creatinine, Ser: 1.72 mg/dL — ABNORMAL HIGH (ref 0.40–1.20)
GFR: 29.92 mL/min — ABNORMAL LOW (ref >60.00)
Glucose, Bld: 89 mg/dL (ref 70–99)
Potassium: 4.5 mEq/L (ref 3.5–5.1)
Sodium: 139 mEq/L (ref 135–145)

## 2022-08-27 LAB — CBC WITH DIFFERENTIAL/PLATELET
Basophils Absolute: 0 10*3/uL (ref 0.0–0.1)
Basophils Relative: 0.1 % (ref 0.0–3.0)
Eosinophils Absolute: 0 10*3/uL (ref 0.0–0.7)
Eosinophils Relative: 0.1 % (ref 0.0–5.0)
HCT: 41.4 % (ref 36.0–46.0)
Hemoglobin: 13 g/dL (ref 12.0–15.0)
Lymphocytes Relative: 9.1 % — ABNORMAL LOW (ref 12.0–46.0)
Lymphs Abs: 0.7 10*3/uL (ref 0.7–4.0)
MCHC: 31.4 g/dL (ref 30.0–36.0)
MCV: 70.4 fl — ABNORMAL LOW (ref 78.0–100.0)
Monocytes Absolute: 0.5 10*3/uL (ref 0.1–1.0)
Monocytes Relative: 6.1 % (ref 3.0–12.0)
Neutro Abs: 6.6 10*3/uL (ref 1.4–7.7)
Neutrophils Relative %: 84.6 % — ABNORMAL HIGH (ref 43.0–77.0)
Platelets: 176 10*3/uL (ref 150.0–400.0)
RBC: 5.89 Mil/uL — ABNORMAL HIGH (ref 3.87–5.11)
RDW: 15.2 % (ref 11.5–15.5)
WBC: 7.8 10*3/uL (ref 4.0–10.5)

## 2022-08-27 LAB — POCT EXHALED NITRIC OXIDE: FeNO level (ppb): 8

## 2022-08-27 MED ORDER — TRELEGY ELLIPTA 100-62.5-25 MCG/ACT IN AEPB: 1.0000 | INHALATION_SPRAY | Freq: Every morning | RESPIRATORY_TRACT | 0 refills | Status: DC

## 2022-08-27 MED ORDER — FAMOTIDINE 20 MG PO TABS: ORAL_TABLET | ORAL | 11 refills | Status: DC

## 2022-08-27 MED ORDER — PANTOPRAZOLE SODIUM 40 MG PO TBEC: 40.0000 mg | DELAYED_RELEASE_TABLET | Freq: Every day | ORAL | 2 refills | Status: DC

## 2022-08-27 NOTE — Patient Instructions (Signed)
Pantoprazole (protonix) 40 mg   Take  30-60 min before first meal of the day and Pepcid (famotidine)  20 mg after supper until return to office - this is the best way to tell whether stomach acid is contributing to your problem.    GERD (REFLUX)  is an extremely common cause of respiratory symptoms just like yours , many times with no obvious heartburn at all.    It can be treated with medication, but also with lifestyle changes including elevation of the head of your bed (ideally with 6 -8inch blocks under the headboard of your bed),  Smoking cessation, avoidance of late meals, excessive alcohol, and avoid fatty foods, chocolate, peppermint, colas, red wine, and acidic juices such as orange juice.  NO MINT OR MENTHOL PRODUCTS SO NO COUGH DROPS  USE SUGARLESS CANDY INSTEAD (Jolley ranchers or Stover's or Life Savers) or even ice chips will also do - the key is to swallow to prevent all throat clearing. NO OIL BASED VITAMINS - use powdered substitutes.  Avoid fish oil when coughing.    Please remember to go to the lab department   for your tests - we will call you with the results when they are available.      Please schedule a follow up office visit in 6 weeks, call sooner if needed with all medications /inhalers/ solutions in hand so we can verify exactly what you are taking. This includes all medications from all doctors and over the [place] separate them into two bags:  the ones you take automatically, no matter what, vs the ones you take just when you feel you need them "BAG #2 is UP TO YOU"  - this will really help [place] help you take your medications more effectively.

## 2022-08-27 NOTE — Assessment & Plan Note (Signed)
Started 2lpm 24/7 in [place] around 2013  See pfts 07/19/2014 with completely reversible airflow obst (so this is not copd) and a dlco of 71% so this is not ILD - [facility]   05/26/16  = 34 - [facility]03    02/06/17  = 27  - [facility]   08/25/22    = 34 / refused sleep medicine eval/ any form or pos pressure vent     Advised: Make sure you check your oxygen saturation  AT  your highest level of activity (not after you stop)   to be sure it stays over 90% and adjust  02 flow upward to maintain this level if needed but remember to turn it back to previous settings when you stop (to conserve your supply).   Return in 6 weeks with all meds in hand using a trust but verify approach to confirm accurate Medication  Reconciliation The principal here is that until we are certain that the  patients are doing what we've asked, it makes no sense to ask them to do more.

## 2022-08-27 NOTE — Assessment & Plan Note (Signed)
Body mass index is 50.75 kg/m.  -  trending down/ congratulated  Lab Results  Component Value Date   TSH 1.870 01/29/2022      Contributing to doe and risk of GERD >>>   reviewed the need and the process to achieve and maintain neg calorie balance > defer f/u primary care including intermittently monitoring thyroid status     Each maintenance medication was reviewed in detail including emphasizing most importantly the difference between maintenance and prns and under what circumstances the prns are to be triggered using an action plan format where appropriate.  Total time for H and P, chart review, counseling, reviewing dpi/hfa/neb/-2 device(s) and generating customized AVS unique to this post hosp f/u office visit / same day charting = 33 min    Each maintenance medication was reviewed in detail including emphasizing most importantly the difference between maintenance and prns and under what circumstances the prns are to be triggered using an action plan format where appropriate.  Total time for H and P, chart review, counseling, reviewing dpi/hfa/neb/-2 device(s) and generating customized AVS unique to this post hosp f/u office visit / same day charting = 33 min

## 2022-08-27 NOTE — Assessment & Plan Note (Signed)
Onset 1990's while living in [name]  pfts 07/31/13  FEV1  1.53 (58%) with ratio 66 > p saba ratio 74 FEV1 1.68 (64%)  - -04/27/2014  try dulera 100 2 bid  - PFT's 07/19/2014 FEV1 2.00 (93%) and ratio 85 after 21% improvement from saba with no inhalers x one week  - trial off acei 12/01/2014 due to pseudoasthma component > resolved  - Spirometry 06/01/2016  FEV1 1.06 (48%)  Ratio 61  - FENO 06/01/2016  =   85 on symbicort 80 x2 > increase to 160  - Prednisone 10 mg floor x years, rec increase  to 20 mg if needing neb  - 10/15/2016 reduced pred to 5 mg daily (gout flares with taper)  - Spirometry 02/22/2018  FEV1 1.23 (57%)  Ratio 71 with min curvature  - FENO 02/22/2018  = 61 - 02/22/2018  After extensive coaching inhaler device  effectiveness =    90% from floor of 75%   - FENO 08/27/2022 = 8 on trelegy 200 so rec change to trelegy 100 (raspy voice on 200 with mild pseudowheeze)   She clearly has no copd with mostly upper airway problems now either related to GERD or high dose ICS/ dpi  so rec  Max gerd rx Decrease trelegy to 100 each am with approp saba: Re SABA :  I spent extra time with pt today reviewing appropriate use of albuterol for prn use on exertion with the following points: 1) saba is for relief of sob that does not improve by walking a slower pace or resting but rather if the pt does not improve after trying this first. 2) If the pt is convinced, as many are, that saba helps recover from activity faster then it's easy to tell if this is the case by re-challenging : ie stop, take the inhaler, then p 5 minutes try the exact same activity (intensity of workload) that just caused the symptoms and see if they are substantially diminished or not after saba 3) if there is an activity that reproducibly causes the symptoms, try the saba 15 min before the activity on alternate days   If in fact the saba really does help, then fine to continue to use it prn but advised may need to look closer at the  maintenance regimen being used to achieve better control of airways disease with exertion.

## 2022-08-28 DIAGNOSIS — D509 Iron deficiency anemia, unspecified: Secondary | ICD-10-CM | POA: Diagnosis not present

## 2022-08-28 DIAGNOSIS — D573 Sickle-cell trait: Secondary | ICD-10-CM | POA: Diagnosis not present

## 2022-08-28 DIAGNOSIS — I5032 Chronic diastolic (congestive) heart failure: Secondary | ICD-10-CM | POA: Diagnosis not present

## 2022-08-28 DIAGNOSIS — I130 Hypertensive heart and chronic kidney disease with heart failure and stage 1 through stage 4 chronic kidney disease, or unspecified chronic kidney disease: Secondary | ICD-10-CM | POA: Diagnosis not present

## 2022-08-28 DIAGNOSIS — J9621 Acute and chronic respiratory failure with hypoxia: Secondary | ICD-10-CM | POA: Diagnosis not present

## 2022-08-28 DIAGNOSIS — J441 Chronic obstructive pulmonary disease with (acute) exacerbation: Secondary | ICD-10-CM | POA: Diagnosis not present

## 2022-08-28 DIAGNOSIS — G4733 Obstructive sleep apnea (adult) (pediatric): Secondary | ICD-10-CM | POA: Diagnosis not present

## 2022-08-28 DIAGNOSIS — J9622 Acute and chronic respiratory failure with hypercapnia: Secondary | ICD-10-CM | POA: Diagnosis not present

## 2022-08-28 DIAGNOSIS — R7303 Prediabetes: Secondary | ICD-10-CM | POA: Diagnosis not present

## 2022-09-04 ENCOUNTER — Ambulatory Visit: Payer: Self-pay

## 2022-09-04 NOTE — Patient Outreach (Signed)
  Care Coordination   Initial Visit Note   09/04/2022 Name: Pamela [name] MRN: [id number] DOB: 12-13-1950  Pamela [name] is a 72 y.o. year old female who sees Dorena Dew, FNP for primary care. I spoke with  Rolland Bimler by phone today.  What matters to the patients health and wellness today?  Continue to improve post hospitalization. Hospitalized 08/16/22-08/25/22 for COPD exacerbation    Goals Addressed             This Visit's Progress    Continue to improve post hospitalization       Interventions Today    Flowsheet Row Most Recent Value  Chronic Disease   Chronic disease during today's visit Chronic Obstructive Pulmonary Disease (COPD)  General Interventions   General Interventions Discussed/Reviewed General Interventions Discussed, Durable Medical Equipment (DME), Vaccines  Vaccines Shingles, COVID-19  Doctor Visits Discussed/Reviewed Doctor Visits Discussed, Doctor Visits Reviewed  [reviewed instructions per pulmonology visit completed on 08/27/22]  Durable Medical Equipment (DME) Oxygen, Other  [Oxygen (supplied by [employer]),  pulse oximetry, scales-patient reports she continues to do daily weights]  PCP/Specialist Visits Compliance with follow-up visit  Exercise Interventions   Exercise Discussed/Reviewed Exercise Discussed  [confirmed active with [facility] for [facility] PT/OT]  Education Interventions   Education Provided Provided Education  Provided Verbal Education On When to see the doctor, Other  [COPD action plan]  Pharmacy Interventions   Pharmacy Dicussed/Reviewed Pharmacy Topics Discussed            SDOH assessments and interventions completed:  Yes recently completed. Denies any changes.   SDOH Interventions Today    Flowsheet Row Most Recent Value  SDOH Interventions   Housing Interventions Intervention Not Indicated     Care Coordination Interventions:  Yes, provided   Follow up plan: Follow up call scheduled for 09/17/22    Encounter Outcome:   Pt. Visit Completed  Thea Silversmith, RN, MSN, BSN, [place] Coordinator [phone]

## 2022-09-04 NOTE — Patient Instructions (Addendum)
Visit Information  Thank you for taking time to visit with me today. Please don't hesitate to contact me if I can be of assistance to you.   Following are the goals we discussed today:   Goals Addressed             This Visit's Progress    Continue to improve post hospitalization       Interventions Today    Flowsheet Row Most Recent Value  Chronic Disease   Chronic disease during today's visit Chronic Obstructive Pulmonary Disease (COPD)  General Interventions   General Interventions Discussed/Reviewed General Interventions Discussed, Durable Medical Equipment (DME), Vaccines  Vaccines Shingles, COVID-19  Doctor Visits Discussed/Reviewed Doctor Visits Discussed, Doctor Visits Reviewed  [reviewed instructions per pulmonology visit completed on 08/27/22]  Durable Medical Equipment (DME) Oxygen, Other  [Oxygen (supplied by [employer]),  pulse oximetry, scales-patient reports she continues to do daily weights]  PCP/Specialist Visits Compliance with follow-up visit  Exercise Interventions   Exercise Discussed/Reviewed Exercise Discussed  [confirmed active with [place] [facility] PT[facility]]  Education Interventions   Education Provided Provided Education  Provided Verbal Education On When to see the doctor, Other  [COPD action [place] Topics Discussed            Our next appointment is by telephone on 09/17/22 at 2:00 pm  Please call the care guide team at [phone] if you need to cancel or reschedule your appointment.   If you are experiencing a Mental Health or [place] or need someone to talk to, please call the Suicide and Crisis Lifeline: [place], RN, MSN, BSN, CCM [place] [phone]     COPD Action Plan A COPD action plan is a description of what to do when you have a flare (exacerbation) of chronic obstructive pulmonary disease (COPD). Your action plan is a color-coded plan  that lists the symptoms that indicate whether your condition is under control and what actions to take. If you have symptoms in the green zone, it means you are doing well that day. If you have symptoms in the yellow zone, it means you are having a bad day or an exacerbation. If you have symptoms in the red zone, you need urgent medical care. Follow the plan that you and your health care provider developed. Review your plan with your health care provider at each visit. Red zone Symptoms in this zone mean that you should get medical help right away. They include: Feeling very short of breath, even when you are resting. Not being able to do any activities because of poor breathing. Not being able to sleep because of poor breathing. Fever or shaking chills. Feeling confused or very sleepy. Chest pain. Coughing up blood. If you have any of these symptoms, call emergency services (911 in the [place] or go to the nearest emergency room. Yellow zone Symptoms in this zone mean that your condition may be getting worse. They include: Feeling more short of breath than usual. Having less energy for daily activities than usual. Phlegm or mucus that is thicker than usual. Needing to use your rescue inhaler or nebulizer more often than usual. More ankle swelling than usual. Coughing more than usual. Feeling like you have a chest cold. Trouble sleeping due to COPD symptoms. Decreased appetite. COPD medicines not helping as much as usual. If you experience any "yellow" symptoms: Keep taking your daily medicines as directed. Use your quick-relief inhaler  as told by your health care provider. If you were prescribed steroid medicine to take by mouth (oral medicine), start taking it as told by your health care provider. If you were prescribed an antibiotic medicine, start taking it as told by your health care provider. Do not stop taking the antibiotic even if you start to feel better. Use oxygen as told by  your health care provider. Get more rest. Do your pursed-lip breathing exercises. Do not smoke. Avoid any irritants in the air. If your signs and symptoms do not improve after taking these steps, call your health care provider right away. Green zone Symptoms in this zone mean that you are doing well. They include: Being able to do your usual activities and exercise. Having the usual amount of coughing, including the same amount of phlegm or mucus. Being able to sleep well. Having a good appetite. Where to find more information: You can find more information about COPD from: American Lung Association, My COPD Action Plan: www.lung.org COPD Foundation: www.copdfoundation.[place]: https://wilson-eaton.com/ Follow these instructions at home: Continue taking your daily medicines as told by your health care provider. Make sure you receive all the immunizations that your health care provider recommends, especially the pneumococcal and influenza vaccines. Wash your hands often with soap and water. Have family members wash their hands too. Regular hand washing can help prevent infections. Follow your usual exercise and diet plan. Avoid irritants in the air, such as smoke. Do not use any products that contain nicotine or tobacco. These products include cigarettes, chewing tobacco, and vaping devices, such as e-cigarettes. If you need help quitting, ask your health care provider. Summary A COPD action plan tells you what to do when you have a flare (exacerbation) of chronic obstructive pulmonary disease (COPD). Follow each action plan for your symptoms. If you have any symptoms in the red zone, call emergency services (911 in the [place] or go to the nearest emergency room. This information is not intended to replace advice given to you by your health care provider. Make sure you discuss any questions you have with your health care provider. Document Revised: 05/14/2020  Document Reviewed: 05/14/2020 Elsevier Patient Education  [place].

## 2022-09-08 ENCOUNTER — Encounter: Payer: Self-pay | Admitting: Family Medicine

## 2022-09-08 ENCOUNTER — Ambulatory Visit (INDEPENDENT_AMBULATORY_CARE_PROVIDER_SITE_OTHER): Payer: Medicare HMO | Admitting: Family Medicine

## 2022-09-08 VITALS — BP 109/66 | HR 58 | Temp 97.6°F | Wt 321.8 lb

## 2022-09-08 DIAGNOSIS — I5033 Acute on chronic diastolic (congestive) heart failure: Secondary | ICD-10-CM

## 2022-09-08 DIAGNOSIS — J9611 Chronic respiratory failure with hypoxia: Secondary | ICD-10-CM

## 2022-09-08 DIAGNOSIS — I10 Essential (primary) hypertension: Secondary | ICD-10-CM

## 2022-09-08 DIAGNOSIS — C3490 Malignant neoplasm of unspecified part of unspecified bronchus or lung: Secondary | ICD-10-CM

## 2022-09-08 DIAGNOSIS — J9612 Chronic respiratory failure with hypercapnia: Secondary | ICD-10-CM

## 2022-09-08 DIAGNOSIS — I700 Atherosclerosis of aorta: Secondary | ICD-10-CM

## 2022-09-08 DIAGNOSIS — J4550 Severe persistent asthma, uncomplicated: Secondary | ICD-10-CM | POA: Insufficient documentation

## 2022-09-08 DIAGNOSIS — N183 Chronic kidney disease, stage 3 unspecified: Secondary | ICD-10-CM | POA: Diagnosis not present

## 2022-09-08 DIAGNOSIS — Z8709 Personal history of other diseases of the respiratory system: Secondary | ICD-10-CM | POA: Diagnosis not present

## 2022-09-08 DIAGNOSIS — D573 Sickle-cell trait: Secondary | ICD-10-CM | POA: Insufficient documentation

## 2022-09-08 NOTE — Progress Notes (Signed)
Established Patient Office Visit  Subjective   Patient ID: Pamela [name], female    DOB: [birth year]  Age: 72 y.o. MRN: [id number]  Chief Complaint  Patient presents with   Hospitalization Follow-up    Cythia Clopper is a 72 year old female with a history significant for essential hypertension, morbid obesity, COPD, chronic respiratory failure with hypoxia, history of asthma, CHF, history of lung cancer, history of gout, and prediabetes presents for posthospital follow-up.  Patient was recently admitted to [facility] long hospital from 1/281/28 - 08/25/2022 for COPD exacerbation.  Patient states that she became increasingly short of breath along with wheezing while at church.  Her friends at church increased her supplemental oxygen to 4 L.  Patient's oxygen saturation was decreased upon arrival to the emergency department.  Patient was hospitalized and given bronchodilators along with IV steroids.  Patient has completed oral steroids.  She states that symptoms have improved.  She is maintaining on 2 L supplemental oxygen.  Patient has her oxygen with her today.  She denies any wheezing or persistent coughing.  Patient was also evaluated by her pulmonology team posthospitalization. Patient has a long history of chronic kidney disease.  Creatinine was elevated above baseline during hospitalization.  Patient is followed by nephrology.    Patient Active Problem List   Diagnosis Date Noted   Severe persistent chronic asthma without complication 09/08/2022   Sickle-cell trait (HCC) 09/08/2022   Acute respiratory failure with hypercapnia (HCC) 08/17/2022   COPD exacerbation (HCC) 04/04/2022   Vitamin D deficiency 02/02/2022   Stage 3b chronic kidney disease (HCC) 09/24/2020   Malignant neoplasm of lung (HCC) 09/24/2020   Aortic atherosclerosis (HCC) 09/24/2020   PFO (patent foramen ovale) 09/24/2020   History of stroke 09/24/2020   Acute respiratory failure with hypoxia (HCC) 12/17/2019   COPD with  acute exacerbation (HCC) 12/17/2019   COVID-19 virus infection 01/02/2019   Right knee pain 09/15/2018   Closed head injury with concussion 09/06/2018   Hypokalemia 09/06/2018   Hypomagnesemia 09/06/2018   Asthma, chronic obstructive, with acute exacerbation (HCC) 08/02/2018   Plantar fasciitis 04/14/2017   Microcytosis 11/28/2015   Solitary kidney 07/18/2015   Onychomycosis 01/09/2015   Ingrown nail 01/09/2015   Pain in lower limb 01/09/2015   Chronic respiratory failure with hypoxia and hypercapnia (HCC) 07/19/2014   Acute on chronic respiratory failure with hypoxia and hypercapnia (HCC) 06/24/2014   Anemia, iron deficiency 05/27/2014   Acute gouty arthritis 05/25/2014   Obstructive sleep apnea 05/25/2014   Joint pain 05/24/2014   Chronic diastolic CHF (congestive heart failure) (HCC)    Sarcoidosis    Metabolic syndrome 05/21/2014   Asthma, chronic 04/27/2014   Anemia of chronic disease 04/19/2014   Morbid obesity (HCC) 04/18/2014   Immunization due 04/18/2014   Need for prophylactic vaccination and inoculation against influenza 04/18/2014   Prediabetes 04/13/2014   Essential hypertension 04/13/2014   Lower extremity edema 04/13/2014   History of lung cancer 04/13/2014   Past Medical History:  Diagnosis Date   Arthritis    Asthma    Cancer (HCC)    lung, adenocarcinoma right lung 2012   CHF (congestive heart failure) (HCC)    Preserved EF   Congenital single kidney    With chronic kidney disease   COPD (chronic obstructive pulmonary disease) (HCC)    Gout    Hypertension    Lung cancer (HCC) 2012   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 Patient notes it was consistent with sarcoidosis but actual pathology results not available.   Oxygen deficiency    Sarcoidosis    Sickle cell trait  (HCC)    Sleep apnea    Past Surgical History:  Procedure Laterality Date   LOOP RECORDER INSERTION N/A 09/08/2018   Procedure: LOOP RECORDER INSERTION;  Surgeon: Marinus Maw, MD;  Location: [facility] INVASIVE CV LAB;  Service: Cardiovascular;  Laterality: N/A;   LUNG BIOPSY     TEE WITHOUT CARDIOVERSION N/A 09/08/2018   Procedure: TRANSESOPHAGEAL ECHOCARDIOGRAM (TEE);  Surgeon: Wendall Stade, MD;  Location: [facility] ENDOSCOPY;  Service: Cardiovascular;  Laterality: N/A;  with loop   TUBAL LIGATION     VEIN LIGATION AND STRIPPING     Social History   Tobacco Use   Smoking status: Never   Smokeless tobacco: Never  Vaping Use   Vaping Use: Never used  Substance Use Topics   Alcohol use: No    Alcohol/week: 0.0 standard drinks of alcohol   Drug use: No   Social History   Socioeconomic History   Marital status: Divorced    Spouse name: Not on file   Number of children: 2   Years of education: Not on file   Highest education level: Not on file  Occupational History   Not on file  Tobacco Use   Smoking status: Never   Smokeless tobacco: Never  Vaping Use   Vaping Use: Never used  Substance and Sexual Activity   Alcohol use: No    Alcohol/week: 0.0 standard drinks of alcohol   Drug use: No   Sexual activity: Not Currently  Other Topics Concern   Not on file  Social History Narrative   Lives with son.  One son is deceased.     Social Determinants of Health   Financial Resource Strain: Low Risk  (07/15/2022)   Overall Financial Resource Strain (CARDIA)    Difficulty of Paying Living Expenses: Not hard at all  Food Insecurity: No Food Insecurity (08/26/2022)   Hunger Vital Sign    Worried About Running Out of Food in the Last Year: Never true    Ran Out of Food in the Last Year: Never true  Transportation Needs: No Transportation Needs (08/26/2022)   PRAPARE - Administrator, Civil Service (Medical): No    Lack of Transportation (Non-Medical): No  Physical Activity:  Inactive (07/15/2022)   Exercise Vital Sign    Days of Exercise per Week: 0 days    Minutes of Exercise per Session: 0 min  Stress: No Stress Concern Present (07/15/2022)   [employer] of Occupational Health - Occupational Stress Questionnaire    Feeling of Stress : Not at all  Social Connections: Moderately Integrated (07/15/2022)   Social Connection and Isolation Panel [NHANES]    Frequency of Communication with Friends and Family: More than three times a week    Frequency of Social Gatherings with Friends and Family: More than three times a week    Attends Religious Services: More than 4 times per year    Active Member of [employer] or Organizations: Yes    Attends Banker Meetings: More than 4 times per year    Marital Status: Divorced  Intimate Partner Violence: Not At Risk (08/17/2022)   Humiliation, Afraid, Rape, and Kick questionnaire    Fear  of Current or Ex-Partner: No    Emotionally Abused: No    Physically Abused: No    Sexually Abused: No   Family Status  Relation Name Status   Mother  Deceased   Father  Deceased   Sister  (Not Specified)   Sister  (Not Specified)   Brother  (Not Specified)   Family History  Problem Relation Age of Onset   Hypertension Mother    Renal Disease Mother    High blood pressure Mother    Heart disease Mother    Cancer Father        stomach   Heart disease Father        No details   Cervical cancer Sister    Diabetes Sister    Multiple myeloma Sister    Cancer Brother    Diabetes Brother    Allergies  Allergen Reactions   Sulfa Antibiotics Hives and Rash      Review of Systems  Constitutional: Negative.   HENT: Negative.    Eyes: Negative.   Respiratory:  Positive for shortness of breath. Negative for cough, hemoptysis, sputum production and wheezing.   Cardiovascular: Negative.   Gastrointestinal: Negative.   Genitourinary: Negative.   Musculoskeletal: Negative.   Skin: Negative.   Neurological:  Negative.   Endo/Heme/Allergies: Negative.       Objective:     BP 109/66   Pulse (!) 58   Temp 97.6 F (36.4 C)   Wt (!) 321 lb 12.8 oz (146 kg)   SpO2 100%   BMI 51.94 kg/m  BP Readings from Last 3 Encounters:  09/08/22 109/66  08/27/22 122/64  08/25/22 121/74   Wt Readings from Last 3 Encounters:  09/08/22 (!) 321 lb 12.8 oz (146 kg)  08/27/22 (!) 314 lb 6.4 oz (142.6 kg)  08/25/22 (!) 312 lb (141.5 kg)      Physical Exam Constitutional:      Appearance: Normal appearance. She is obese.  Eyes:     Pupils: Pupils are equal, round, and reactive to light.  Cardiovascular:     Rate and Rhythm: Normal rate and regular rhythm.     Pulses: Normal pulses.  Pulmonary:     Effort: Pulmonary effort is normal.  Abdominal:     General: Bowel sounds are normal.  Skin:    General: Skin is warm.  Neurological:     General: No focal deficit present.     Mental Status: She is alert. Mental status is at baseline.  Psychiatric:        Mood and Affect: Mood normal.      Results for orders placed or performed in visit on 09/08/22  Basic Metabolic Panel  Result Value Ref Range   Glucose 102 (H) 70 - 99 mg/dL   BUN 29 (H) 8 - 27 mg/dL   Creatinine, Ser 1.61 (H) 0.57 - 1.00 mg/dL   eGFR 39 (L) >09 UE/AVW/0.98   BUN/Creatinine Ratio 20 12 - 28   Sodium 143 134 - 144 mmol/L   Potassium 3.7 3.5 - 5.2 mmol/L   Chloride 104 96 - 106 mmol/L   CO2 24 20 - 29 mmol/L   Calcium 8.1 (L) 8.7 - 10.3 mg/dL    Last CBC Lab Results  Component Value Date   WBC 7.8 08/27/2022   HGB 13.0 08/27/2022   HCT 41.4 08/27/2022   MCV 70.4 (L) 08/27/2022   MCH 21.7 (L) 08/25/2022   RDW 15.2 08/27/2022   PLT 176.0 08/27/2022  Last metabolic panel Lab Results  Component Value Date   GLUCOSE 102 (H) 09/08/2022   NA 143 09/08/2022   K 3.7 09/08/2022   CL 104 09/08/2022   CO2 24 09/08/2022   BUN 29 (H) 09/08/2022   CREATININE 1.44 (H) 09/08/2022   GFRNONAA 39 (L) 08/25/2022    CALCIUM 8.1 (L) 09/08/2022   PHOS 4.2 12/19/2019   PROT 6.8 08/17/2022   ALBUMIN 3.6 08/17/2022   LABGLOB 2.9 01/29/2022   AGRATIO 1.4 01/29/2022   BILITOT 0.3 08/17/2022   ALKPHOS 104 08/17/2022   AST 15 08/17/2022   ALT 9 08/17/2022   ANIONGAP 11 08/25/2022   Last lipids Lab Results  Component Value Date   CHOL 166 01/29/2022   HDL 55 01/29/2022   LDLCALC 96 01/29/2022   TRIG 78 01/29/2022   CHOLHDL 2.7 03/01/2019   Last hemoglobin A1c Lab Results  Component Value Date   HGBA1C 5.4 08/17/2022   Last thyroid functions Lab Results  Component Value Date   TSH 1.870 01/29/2022   Last vitamin D Lab Results  Component Value Date   VD25OH 8.3 (L) 01/29/2022   Last vitamin B12 and Folate Lab Results  Component Value Date   VITAMINB12 830 01/29/2022   FOLATE 13.8 05/25/2014      The ASCVD Risk score (Arnett DK, et al., 2019) failed to calculate for the following reasons:   The patient has a prior MI or stroke diagnosis    Assessment & Plan:   Problem List Items Addressed This Visit       Cardiovascular and Mediastinum   Essential hypertension     Respiratory   Severe persistent chronic asthma without complication   Chronic respiratory failure with hypoxia and hypercapnia (HCC) - Primary   Other Visit Diagnoses     Stage 3 chronic kidney disease, unspecified whether stage 3a or 3b CKD (HCC)       Relevant Orders   Basic Metabolic Panel (Completed)   History of COPD         Will review all laboratory values as results become available.  Fax creatinine results to [facility].  Patient will resume inhalers and supplemental oxygen at 2 L. Patient will follow-up in 3 months for chronic conditions.  Return in about 3 months (around 12/07/2022).   Nolon Nations  APRN, MSN, FNP-C Patient Care [facility] [address] [place], [place] [address] [phone]

## 2022-09-09 DIAGNOSIS — J9622 Acute and chronic respiratory failure with hypercapnia: Secondary | ICD-10-CM | POA: Diagnosis not present

## 2022-09-09 DIAGNOSIS — I5032 Chronic diastolic (congestive) heart failure: Secondary | ICD-10-CM | POA: Diagnosis not present

## 2022-09-09 DIAGNOSIS — J9621 Acute and chronic respiratory failure with hypoxia: Secondary | ICD-10-CM | POA: Diagnosis not present

## 2022-09-09 DIAGNOSIS — G4733 Obstructive sleep apnea (adult) (pediatric): Secondary | ICD-10-CM | POA: Diagnosis not present

## 2022-09-09 DIAGNOSIS — D573 Sickle-cell trait: Secondary | ICD-10-CM | POA: Diagnosis not present

## 2022-09-09 DIAGNOSIS — R7303 Prediabetes: Secondary | ICD-10-CM | POA: Diagnosis not present

## 2022-09-09 DIAGNOSIS — I130 Hypertensive heart and chronic kidney disease with heart failure and stage 1 through stage 4 chronic kidney disease, or unspecified chronic kidney disease: Secondary | ICD-10-CM | POA: Diagnosis not present

## 2022-09-09 DIAGNOSIS — D509 Iron deficiency anemia, unspecified: Secondary | ICD-10-CM | POA: Diagnosis not present

## 2022-09-09 DIAGNOSIS — J441 Chronic obstructive pulmonary disease with (acute) exacerbation: Secondary | ICD-10-CM | POA: Diagnosis not present

## 2022-09-09 LAB — BASIC METABOLIC PANEL
BUN/Creatinine Ratio: 20 (ref 12–28)
BUN: 29 mg/dL — ABNORMAL HIGH (ref 8–27)
CO2: 24 mmol/L (ref 20–29)
Calcium: 8.1 mg/dL — ABNORMAL LOW (ref 8.7–10.3)
Chloride: 104 mmol/L (ref 96–106)
Creatinine, Ser: 1.44 mg/dL — ABNORMAL HIGH (ref 0.57–1.00)
Glucose: 102 mg/dL — ABNORMAL HIGH (ref 70–99)
Potassium: 3.7 mmol/L (ref 3.5–5.2)
Sodium: 143 mmol/L (ref 134–144)
eGFR: 39 mL/min/{1.73_m2} — ABNORMAL LOW (ref >59)

## 2022-09-16 DIAGNOSIS — I5032 Chronic diastolic (congestive) heart failure: Secondary | ICD-10-CM | POA: Diagnosis not present

## 2022-09-16 DIAGNOSIS — J9622 Acute and chronic respiratory failure with hypercapnia: Secondary | ICD-10-CM | POA: Diagnosis not present

## 2022-09-16 DIAGNOSIS — J441 Chronic obstructive pulmonary disease with (acute) exacerbation: Secondary | ICD-10-CM | POA: Diagnosis not present

## 2022-09-16 DIAGNOSIS — G4733 Obstructive sleep apnea (adult) (pediatric): Secondary | ICD-10-CM | POA: Diagnosis not present

## 2022-09-16 DIAGNOSIS — D509 Iron deficiency anemia, unspecified: Secondary | ICD-10-CM | POA: Diagnosis not present

## 2022-09-16 DIAGNOSIS — R7303 Prediabetes: Secondary | ICD-10-CM | POA: Diagnosis not present

## 2022-09-16 DIAGNOSIS — D573 Sickle-cell trait: Secondary | ICD-10-CM | POA: Diagnosis not present

## 2022-09-16 DIAGNOSIS — I130 Hypertensive heart and chronic kidney disease with heart failure and stage 1 through stage 4 chronic kidney disease, or unspecified chronic kidney disease: Secondary | ICD-10-CM | POA: Diagnosis not present

## 2022-09-16 DIAGNOSIS — J9621 Acute and chronic respiratory failure with hypoxia: Secondary | ICD-10-CM | POA: Diagnosis not present

## 2022-09-17 ENCOUNTER — Telehealth: Payer: Self-pay

## 2022-09-17 NOTE — Patient Outreach (Signed)
  Care Coordination   09/17/2022 Name: Pamela [name] MRN: [id number] DOB: [birth year]   Care Coordination Outreach Attempts:  An unsuccessful telephone outreach was attempted today to offer the patient information about available care coordination services as a benefit of their health plan.   Follow Up Plan:  Additional outreach attempts will be made to offer the patient care coordination information and services.   Encounter Outcome:  No Answer   Care Coordination Interventions:  No, not indicated    Thea Silversmith, RN, MSN, BSN, [place] Coordinator [phone]

## 2022-09-18 ENCOUNTER — Encounter: Payer: Self-pay | Admitting: Podiatry

## 2022-09-18 ENCOUNTER — Ambulatory Visit: Payer: Medicare HMO | Admitting: Podiatry

## 2022-09-18 DIAGNOSIS — B351 Tinea unguium: Secondary | ICD-10-CM

## 2022-09-18 DIAGNOSIS — D509 Iron deficiency anemia, unspecified: Secondary | ICD-10-CM | POA: Diagnosis not present

## 2022-09-18 DIAGNOSIS — M79675 Pain in left toe(s): Secondary | ICD-10-CM | POA: Diagnosis not present

## 2022-09-18 DIAGNOSIS — J441 Chronic obstructive pulmonary disease with (acute) exacerbation: Secondary | ICD-10-CM | POA: Diagnosis not present

## 2022-09-18 DIAGNOSIS — M79674 Pain in right toe(s): Secondary | ICD-10-CM | POA: Diagnosis not present

## 2022-09-18 DIAGNOSIS — J9621 Acute and chronic respiratory failure with hypoxia: Secondary | ICD-10-CM | POA: Diagnosis not present

## 2022-09-18 DIAGNOSIS — R7303 Prediabetes: Secondary | ICD-10-CM | POA: Diagnosis not present

## 2022-09-18 DIAGNOSIS — I5032 Chronic diastolic (congestive) heart failure: Secondary | ICD-10-CM | POA: Diagnosis not present

## 2022-09-18 DIAGNOSIS — J9622 Acute and chronic respiratory failure with hypercapnia: Secondary | ICD-10-CM | POA: Diagnosis not present

## 2022-09-18 DIAGNOSIS — D573 Sickle-cell trait: Secondary | ICD-10-CM | POA: Diagnosis not present

## 2022-09-18 DIAGNOSIS — G4733 Obstructive sleep apnea (adult) (pediatric): Secondary | ICD-10-CM | POA: Diagnosis not present

## 2022-09-18 DIAGNOSIS — I130 Hypertensive heart and chronic kidney disease with heart failure and stage 1 through stage 4 chronic kidney disease, or unspecified chronic kidney disease: Secondary | ICD-10-CM | POA: Diagnosis not present

## 2022-09-18 NOTE — Progress Notes (Signed)
Subjective: Chief Complaint  Patient presents with   Debridement    Trim toenails    72 y.o. returns the office today for painful, elongated, thickened toenails which she cannot trim herself.  She has pain in the nails but no other areas of discomfort.  No open lesions that she reports.  Objective: AAO 3, NAD DP/PT pulses palpable, CRT less than 3 seconds Chronic lower extremity edema. Nails hypertrophic, dystrophic, elongated, brittle, discolored 10. There is tenderness overlying the nails 1-5 bilaterally. There is no surrounding erythema or drainage along the nail sites. There are no other areas of discomfort noted today. No pain with calf compression, swelling, warmth, erythema.  Assessment: Patient presents with symptomatic onychomycosis  Plan: -Treatment options including alternatives, risks, complications were discussed -Nails sharply debrided 10 without complication/bleeding. -Daily foot inspection discussed. -Follow-up in 3 months or sooner if any problems are to arise. In the meantime, encouraged to call the office with any questions, concerns, changes symptoms.  Trula Slade DPM

## 2022-09-21 ENCOUNTER — Telehealth: Payer: Self-pay

## 2022-09-21 NOTE — Telephone Encounter (Signed)
He also wanted to advise of weight increase of 18 pound in a week . No sob and he will keep following. Please advise [facility]

## 2022-09-21 NOTE — Telephone Encounter (Signed)
Requesting to extend pt OT for 1 week one , 2 week 3 and then 1 week 1. Please advise [facility]

## 2022-09-22 DIAGNOSIS — D573 Sickle-cell trait: Secondary | ICD-10-CM | POA: Diagnosis not present

## 2022-09-22 DIAGNOSIS — J441 Chronic obstructive pulmonary disease with (acute) exacerbation: Secondary | ICD-10-CM | POA: Diagnosis not present

## 2022-09-22 DIAGNOSIS — J9622 Acute and chronic respiratory failure with hypercapnia: Secondary | ICD-10-CM | POA: Diagnosis not present

## 2022-09-22 DIAGNOSIS — G4733 Obstructive sleep apnea (adult) (pediatric): Secondary | ICD-10-CM | POA: Diagnosis not present

## 2022-09-22 DIAGNOSIS — R7303 Prediabetes: Secondary | ICD-10-CM | POA: Diagnosis not present

## 2022-09-22 DIAGNOSIS — I130 Hypertensive heart and chronic kidney disease with heart failure and stage 1 through stage 4 chronic kidney disease, or unspecified chronic kidney disease: Secondary | ICD-10-CM | POA: Diagnosis not present

## 2022-09-22 DIAGNOSIS — D509 Iron deficiency anemia, unspecified: Secondary | ICD-10-CM | POA: Diagnosis not present

## 2022-09-22 DIAGNOSIS — I5032 Chronic diastolic (congestive) heart failure: Secondary | ICD-10-CM | POA: Diagnosis not present

## 2022-09-22 DIAGNOSIS — J9621 Acute and chronic respiratory failure with hypoxia: Secondary | ICD-10-CM | POA: Diagnosis not present

## 2022-09-23 ENCOUNTER — Other Ambulatory Visit: Payer: Self-pay | Admitting: Family Medicine

## 2022-09-23 DIAGNOSIS — R635 Abnormal weight gain: Secondary | ICD-10-CM

## 2022-09-23 DIAGNOSIS — Z6841 Body Mass Index (BMI) 40.0 and over, adult: Secondary | ICD-10-CM

## 2022-09-23 DIAGNOSIS — I5032 Chronic diastolic (congestive) heart failure: Secondary | ICD-10-CM

## 2022-09-23 NOTE — Progress Notes (Signed)
Orders Placed This Encounter  Procedures   B Nat Peptide    Standing Status:   Future    Standing Expiration Date:   09/23/2023   Thyroid Panel With TSH    Standing Status:   Future    Standing Expiration Date:   09/23/2023   Hemoglobin A1c    Standing Status:   Future    Standing Expiration Date:   09/23/2023   Comprehensive metabolic panel    Standing Status:   Future    Standing Expiration Date:   09/23/2023   [name]  APRN, MSN, FNP-C Patient [place] [address] [place], [place] [address] [phone]

## 2022-09-23 NOTE — Telephone Encounter (Signed)
Appointment for labs and weigh in per [place] have been made [facility]

## 2022-09-24 ENCOUNTER — Other Ambulatory Visit: Payer: Medicare HMO

## 2022-09-24 DIAGNOSIS — I5032 Chronic diastolic (congestive) heart failure: Secondary | ICD-10-CM | POA: Diagnosis not present

## 2022-09-24 DIAGNOSIS — Z6841 Body Mass Index (BMI) 40.0 and over, adult: Secondary | ICD-10-CM

## 2022-09-24 DIAGNOSIS — R635 Abnormal weight gain: Secondary | ICD-10-CM

## 2022-09-25 ENCOUNTER — Telehealth: Payer: Self-pay

## 2022-09-25 LAB — COMPREHENSIVE METABOLIC PANEL
ALT: 12 IU/L (ref 0–32)
AST: 18 IU/L (ref 0–40)
Albumin/Globulin Ratio: 1.9 (ref 1.2–2.2)
Albumin: 3.7 g/dL — ABNORMAL LOW (ref 3.9–4.9)
Alkaline Phosphatase: 173 IU/L — ABNORMAL HIGH (ref 44–121)
BUN/Creatinine Ratio: 11 — ABNORMAL LOW (ref 12–28)
BUN: 16 mg/dL (ref 8–27)
Bilirubin Total: <0.2 mg/dL (ref 0.0–1.2)
CO2: 26 mmol/L (ref 20–29)
Calcium: 8.1 mg/dL — ABNORMAL LOW (ref 8.7–10.3)
Chloride: 100 mmol/L (ref 96–106)
Creatinine, Ser: 1.47 mg/dL — ABNORMAL HIGH (ref 0.57–1.00)
Globulin, Total: 2 g/dL (ref 1.5–4.5)
Glucose: 85 mg/dL (ref 70–99)
Potassium: 4.1 mmol/L (ref 3.5–5.2)
Sodium: 141 mmol/L (ref 134–144)
Total Protein: 5.7 g/dL — ABNORMAL LOW (ref 6.0–8.5)
eGFR: 38 mL/min/{1.73_m2} — ABNORMAL LOW (ref >59)

## 2022-09-25 LAB — BRAIN NATRIURETIC PEPTIDE: BNP: 79.5 pg/mL (ref 0.0–100.0)

## 2022-09-25 LAB — THYROID PANEL WITH TSH
Free Thyroxine Index: 1.9 (ref 1.2–4.9)
T3 Uptake Ratio: 28 % (ref 24–39)
T4, Total: 6.8 ug/dL (ref 4.5–12.0)
TSH: 2.81 u[IU]/mL (ref 0.450–4.500)

## 2022-09-25 LAB — HEMOGLOBIN A1C
Est. average glucose Bld gHb Est-mCnc: 114 mg/dL
Hgb A1c MFr Bld: 5.6 % (ref 4.8–5.6)

## 2022-09-25 IMAGING — CT CT CHEST W/O CM
2 of 4 series · 14 of 36 positions shown, 17 images · non-contrast
Comparison: Multiple priors including most recent CT November 20, 2020

CLINICAL DATA: History of non-small cell lung cancer, surveillance.

* Tracking Code: BO *
EXAM:
CT CHEST WITHOUT CONTRAST
TECHNIQUE: Multidetector CT imaging of the chest was performed following the
standard protocol without IV contrast.
RADIATION DOSE REDUCTION: This exam was performed according to the
departmental dose-optimization program which includes automated
exposure control, adjustment of the mA and/or kV according to
patient size and/or use of iterative reconstruction technique.

[Series 2: thorax · axial · 0.84mm/px · z∈[+1241,+1541]mm · 11 of 178 slices shown, 14 images]
[im 14/178  mediastinal]
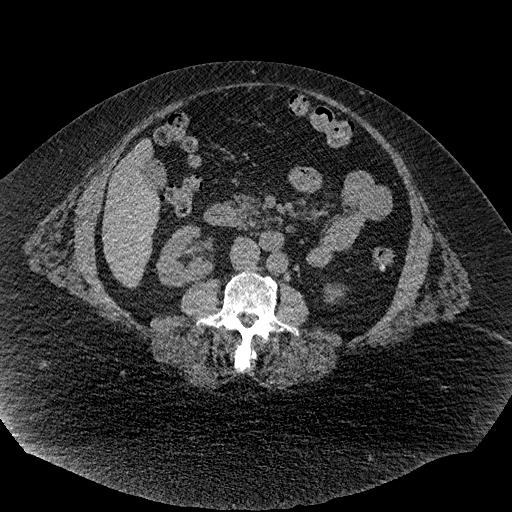
[im 14/178  lung]
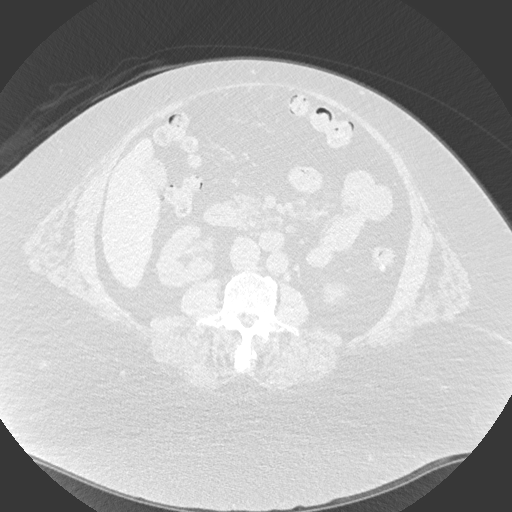
[im 28/178  lung]
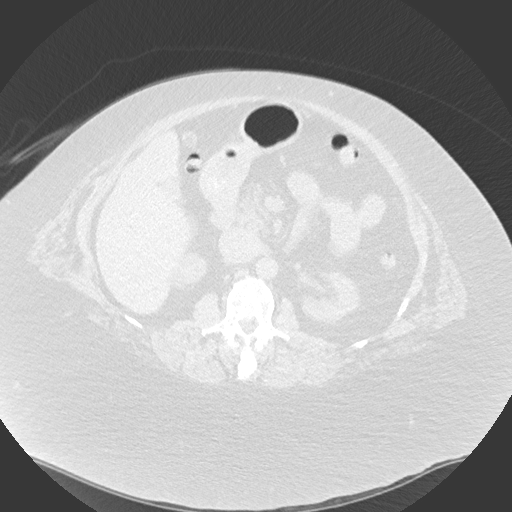
[im 41/178  lung]
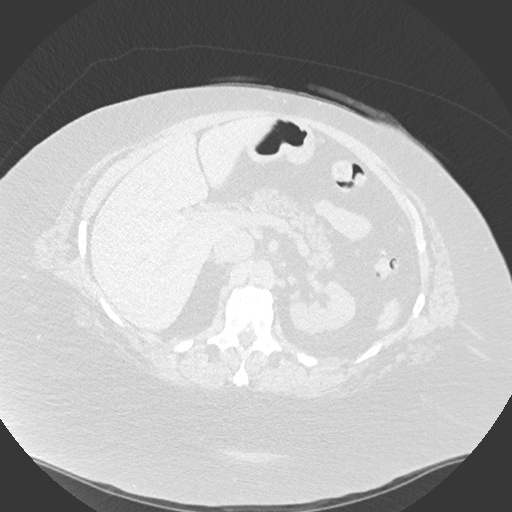
[im 55/178  lung]
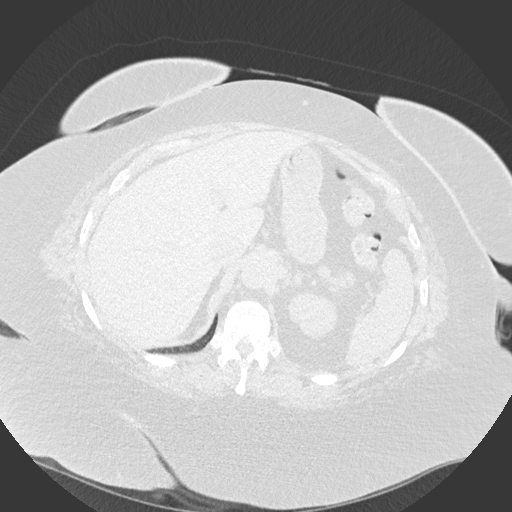
[im 69/178  mediastinal]
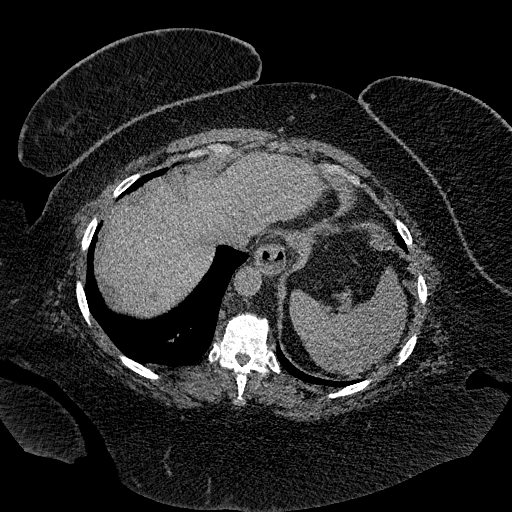
[im 69/178  lung]
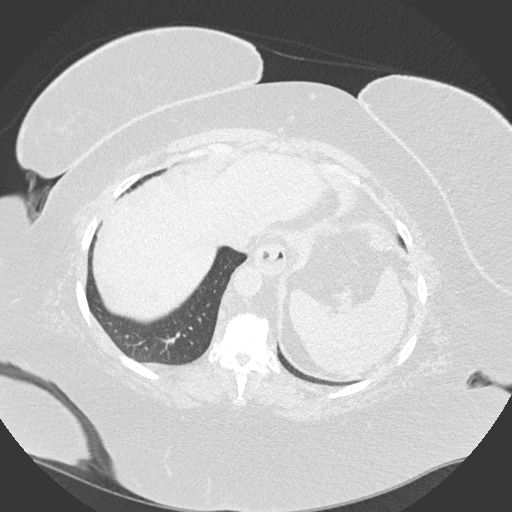
[im 96/178  lung]
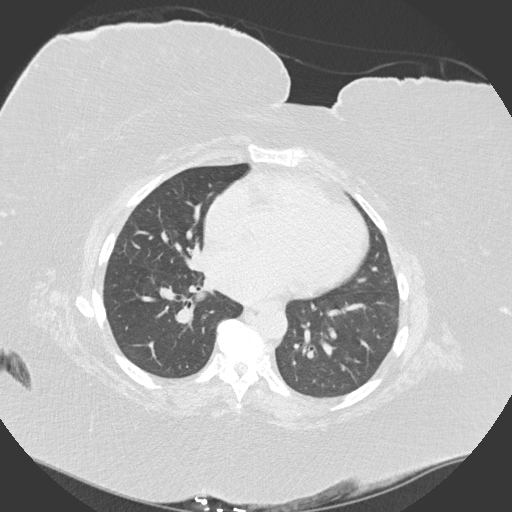
[im 109/178  lung]
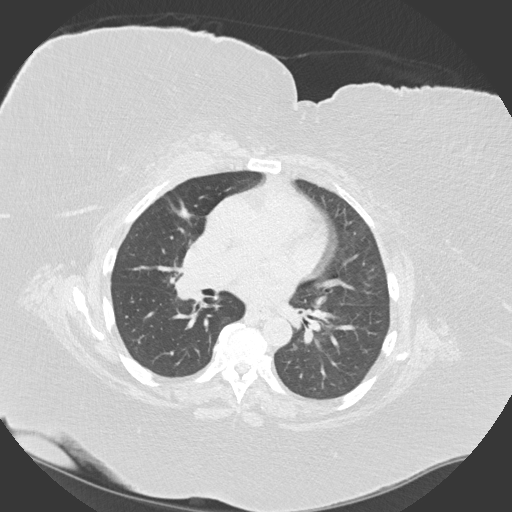
[im 123/178  lung]
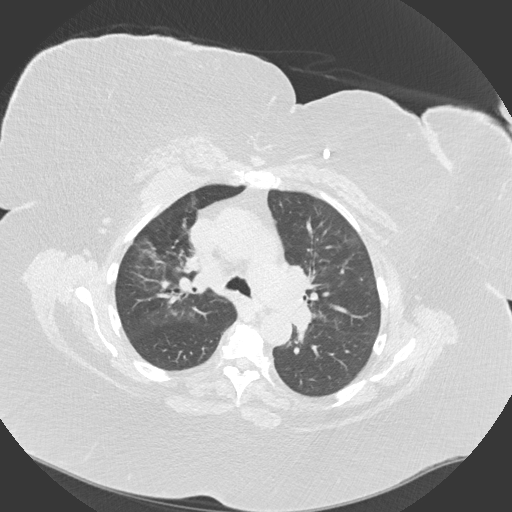
[im 137/178  mediastinal]
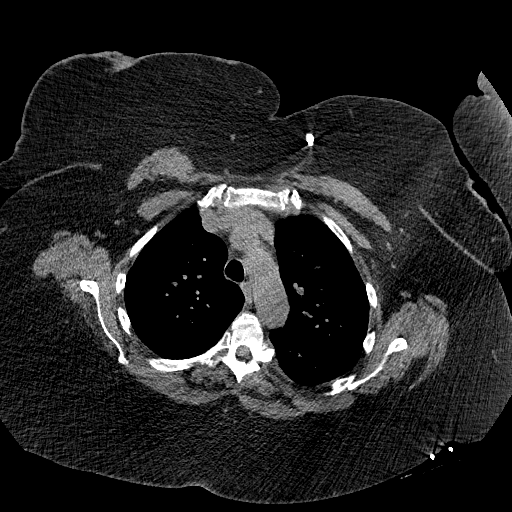
[im 137/178  lung]
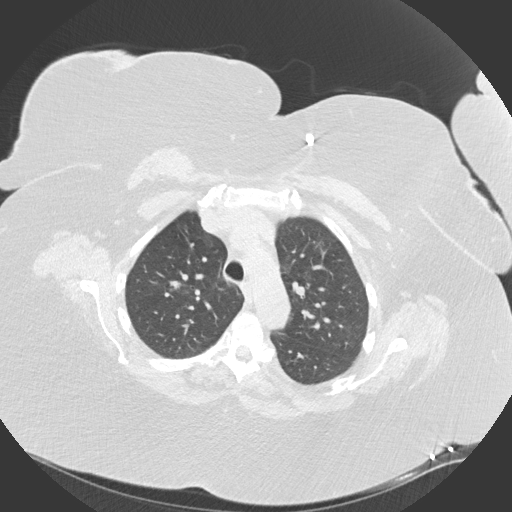
[im 150/178  lung]
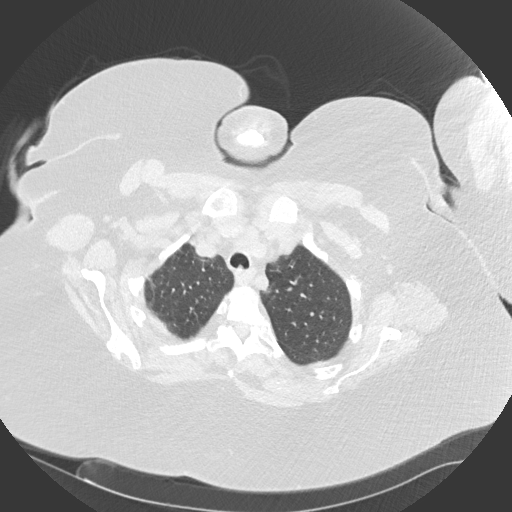
[im 164/178  lung]
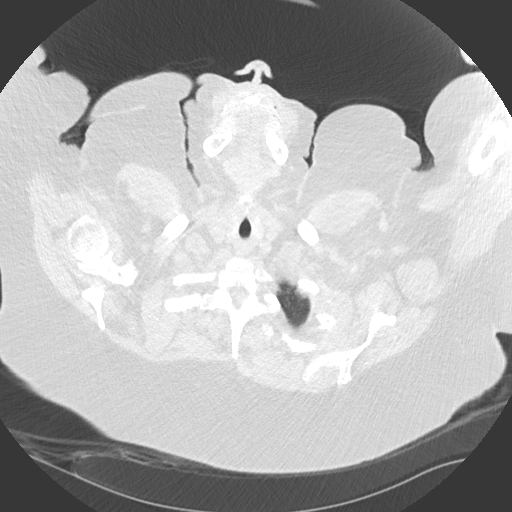

[Series 6: coronal · coronal · 0.74mm/px · 3 of 207 slices shown]
[im 42/207  lung]
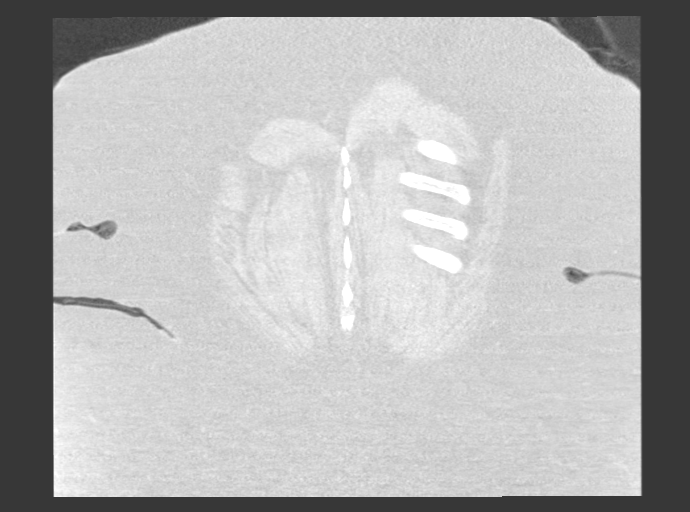
[im 83/207  lung]
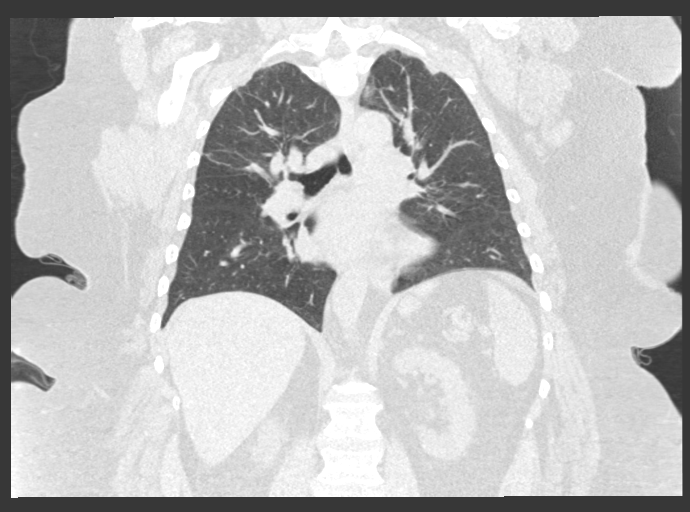
[im 124/207  lung]
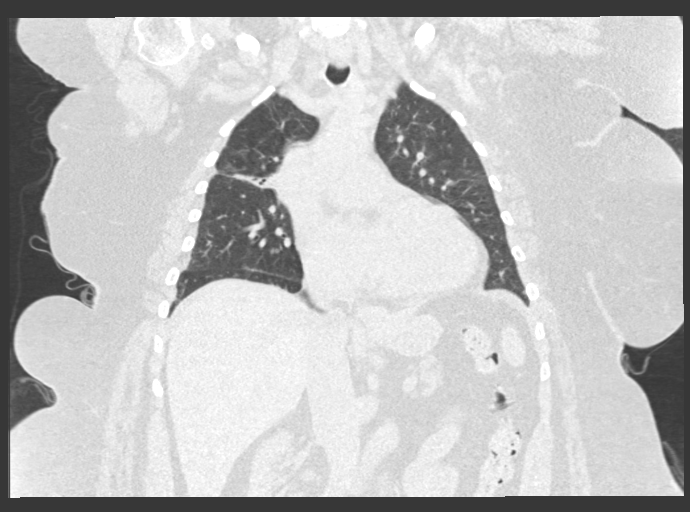

[14 of 36 positions shown; findings below may reference images not displayed]

FINDINGS: Cardiovascular: Aortic atherosclerosis without aneurysmal dilation.
Mild cardiomegaly. Implanted cardiac loop recording device. No
significant pericardial effusion/thickening.

Mediastinum/Nodes: No discrete thyroid nodule. No pathologically
enlarged mediastinal, hilar or axillary lymph nodes, noting limited
sensitivity for the detection of hilar adenopathy on this
noncontrast study. Small hiatal hernia.

Lungs/Pleura: Similar post treatment fibrosis, consolidation and
volume loss of the perihilar right upper lobe for instance on image
60/5. Stable 3-4 mm pulmonary nodule in the posterior right upper
lobe on image 54/5. Previously identified 3 mm pulmonary nodule in
the left lung base is not well seen on today's examination. No new
suspicious pulmonary nodules or masses. No pleural effusion. No
pneumothorax.

Upper Abdomen: No acute abnormality in the upper abdomen. Colonic
diverticulosis without findings of acute diverticulitis.

Musculoskeletal: Soft tissue mass of the right pectoralis major and
minor musculature appears similar to prior measuring 5.5 x 2.1 cm on
image 67/5 previously 5.0 x 3.1 cm. Multilevel degenerative changes
spine with bridging anterior vertebral osteophytes. No aggressive
lytic or blastic lesion of bone.
IMPRESSION: 1. Similar post treatment fibrosis, consolidation and volume loss of
the perihilar right upper lobe no evidence of local recurrence.
2. Stable tiny right upper lobe pulmonary nodule almost certainly
reflecting a benign/incidental finding. No new suspicious pulmonary
nodules or masses.
3. Similar 5.5 x 2.1 cm soft tissue mass of the right pectoralis
major and minor musculature, again of uncertain clinical
significance.
4.  Aortic Atherosclerosis ([id number]).

## 2022-09-25 NOTE — Telephone Encounter (Signed)
P/T nurse called to advise pt had large amount of weight gain. Prior to [place] being out . [place] advise that pt should come in for labs and to weigh her. Pt is currently on furosemide 40 mg QD. Please advise .  [place].

## 2022-09-25 NOTE — Telephone Encounter (Signed)
[facility] is requesting to continue P\T 1 week Please advise [facility]

## 2022-09-27 DIAGNOSIS — R7303 Prediabetes: Secondary | ICD-10-CM | POA: Diagnosis not present

## 2022-09-27 DIAGNOSIS — I5032 Chronic diastolic (congestive) heart failure: Secondary | ICD-10-CM | POA: Diagnosis not present

## 2022-09-27 DIAGNOSIS — D509 Iron deficiency anemia, unspecified: Secondary | ICD-10-CM | POA: Diagnosis not present

## 2022-09-27 DIAGNOSIS — G4733 Obstructive sleep apnea (adult) (pediatric): Secondary | ICD-10-CM | POA: Diagnosis not present

## 2022-09-27 DIAGNOSIS — I130 Hypertensive heart and chronic kidney disease with heart failure and stage 1 through stage 4 chronic kidney disease, or unspecified chronic kidney disease: Secondary | ICD-10-CM | POA: Diagnosis not present

## 2022-09-27 DIAGNOSIS — J441 Chronic obstructive pulmonary disease with (acute) exacerbation: Secondary | ICD-10-CM | POA: Diagnosis not present

## 2022-09-27 DIAGNOSIS — J9622 Acute and chronic respiratory failure with hypercapnia: Secondary | ICD-10-CM | POA: Diagnosis not present

## 2022-09-27 DIAGNOSIS — J9621 Acute and chronic respiratory failure with hypoxia: Secondary | ICD-10-CM | POA: Diagnosis not present

## 2022-09-27 DIAGNOSIS — D573 Sickle-cell trait: Secondary | ICD-10-CM | POA: Diagnosis not present

## 2022-09-29 DIAGNOSIS — J9622 Acute and chronic respiratory failure with hypercapnia: Secondary | ICD-10-CM | POA: Diagnosis not present

## 2022-09-29 DIAGNOSIS — D573 Sickle-cell trait: Secondary | ICD-10-CM | POA: Diagnosis not present

## 2022-09-29 DIAGNOSIS — I5032 Chronic diastolic (congestive) heart failure: Secondary | ICD-10-CM | POA: Diagnosis not present

## 2022-09-29 DIAGNOSIS — I130 Hypertensive heart and chronic kidney disease with heart failure and stage 1 through stage 4 chronic kidney disease, or unspecified chronic kidney disease: Secondary | ICD-10-CM | POA: Diagnosis not present

## 2022-09-29 DIAGNOSIS — J441 Chronic obstructive pulmonary disease with (acute) exacerbation: Secondary | ICD-10-CM | POA: Diagnosis not present

## 2022-09-29 DIAGNOSIS — D509 Iron deficiency anemia, unspecified: Secondary | ICD-10-CM | POA: Diagnosis not present

## 2022-09-29 DIAGNOSIS — G4733 Obstructive sleep apnea (adult) (pediatric): Secondary | ICD-10-CM | POA: Diagnosis not present

## 2022-09-29 DIAGNOSIS — J9621 Acute and chronic respiratory failure with hypoxia: Secondary | ICD-10-CM | POA: Diagnosis not present

## 2022-09-29 DIAGNOSIS — R7303 Prediabetes: Secondary | ICD-10-CM | POA: Diagnosis not present

## 2022-09-30 DIAGNOSIS — J9622 Acute and chronic respiratory failure with hypercapnia: Secondary | ICD-10-CM | POA: Diagnosis not present

## 2022-09-30 DIAGNOSIS — J441 Chronic obstructive pulmonary disease with (acute) exacerbation: Secondary | ICD-10-CM | POA: Diagnosis not present

## 2022-09-30 DIAGNOSIS — G4733 Obstructive sleep apnea (adult) (pediatric): Secondary | ICD-10-CM | POA: Diagnosis not present

## 2022-09-30 DIAGNOSIS — I130 Hypertensive heart and chronic kidney disease with heart failure and stage 1 through stage 4 chronic kidney disease, or unspecified chronic kidney disease: Secondary | ICD-10-CM | POA: Diagnosis not present

## 2022-09-30 DIAGNOSIS — R7303 Prediabetes: Secondary | ICD-10-CM | POA: Diagnosis not present

## 2022-09-30 DIAGNOSIS — J9621 Acute and chronic respiratory failure with hypoxia: Secondary | ICD-10-CM | POA: Diagnosis not present

## 2022-09-30 DIAGNOSIS — D509 Iron deficiency anemia, unspecified: Secondary | ICD-10-CM | POA: Diagnosis not present

## 2022-09-30 DIAGNOSIS — D573 Sickle-cell trait: Secondary | ICD-10-CM | POA: Diagnosis not present

## 2022-09-30 DIAGNOSIS — I5032 Chronic diastolic (congestive) heart failure: Secondary | ICD-10-CM | POA: Diagnosis not present

## 2022-10-01 NOTE — Telephone Encounter (Signed)
LVM advising Pamela [name] for approval . [facility]

## 2022-10-02 ENCOUNTER — Ambulatory Visit: Payer: Self-pay

## 2022-10-02 DIAGNOSIS — D573 Sickle-cell trait: Secondary | ICD-10-CM | POA: Diagnosis not present

## 2022-10-02 DIAGNOSIS — I130 Hypertensive heart and chronic kidney disease with heart failure and stage 1 through stage 4 chronic kidney disease, or unspecified chronic kidney disease: Secondary | ICD-10-CM | POA: Diagnosis not present

## 2022-10-02 DIAGNOSIS — R7303 Prediabetes: Secondary | ICD-10-CM | POA: Diagnosis not present

## 2022-10-02 DIAGNOSIS — G4733 Obstructive sleep apnea (adult) (pediatric): Secondary | ICD-10-CM | POA: Diagnosis not present

## 2022-10-02 DIAGNOSIS — I5032 Chronic diastolic (congestive) heart failure: Secondary | ICD-10-CM | POA: Diagnosis not present

## 2022-10-02 DIAGNOSIS — D509 Iron deficiency anemia, unspecified: Secondary | ICD-10-CM | POA: Diagnosis not present

## 2022-10-02 DIAGNOSIS — J9621 Acute and chronic respiratory failure with hypoxia: Secondary | ICD-10-CM | POA: Diagnosis not present

## 2022-10-02 DIAGNOSIS — J9622 Acute and chronic respiratory failure with hypercapnia: Secondary | ICD-10-CM | POA: Diagnosis not present

## 2022-10-02 DIAGNOSIS — J441 Chronic obstructive pulmonary disease with (acute) exacerbation: Secondary | ICD-10-CM | POA: Diagnosis not present

## 2022-10-02 NOTE — Patient Instructions (Signed)
Visit Information  Thank you for taking time to visit with me today. Please don't hesitate to contact me if I can be of assistance to you.   Following are the goals we discussed today:   Goals Addressed             This Visit's Progress    Continue to improve post hospitalization       Interventions Today    Flowsheet Row Most Recent Value  Chronic Disease   Chronic disease during today's visit Chronic Obstructive Pulmonary Disease (COPD)  General Interventions   General Interventions Discussed/Reviewed General Interventions Reviewed, Doctor Visits  Doctor Visits Discussed/Reviewed Doctor Visits Discussed  [reviewed patient instructions from pulmonology visit and primary care provider visit.  discussed upcoming appointments]  Education Interventions   Education Provided Provided Education  Provided Verbal Education On Other  [discussed COPD action plan and when to call the doctor.]            Our next appointment is by telephone on 11/02/22 at 11:30 am  Please call the care guide team at [phone] if you need to cancel or reschedule your appointment.   If you are experiencing a Mental Health or [place] or need someone to talk to, please call the Suicide and Crisis Lifeline: [place], RN, MSN, BSN, [place] [phone]

## 2022-10-02 NOTE — Patient Outreach (Signed)
  Care Coordination   Follow Up Visit Note   10/02/2022 Name: Pamela [name] MRN: [id number] DOB: [birth year]  Pamela [name] is a 72 y.o. year old female who sees Dorena Dew, FNP for primary care. I spoke with  Pamela [name] by phone today.  What matters to the patients health and wellness today?  Pamela [name] reports she is doing well. She denies any signs/symptoms of COPD exacerbation. Continues to use O2 at 3l/[place].  Goals Addressed             This Visit's Progress    Continue to improve post hospitalization       Interventions Today    Flowsheet Row Most Recent Value  Chronic Disease   Chronic disease during today's visit Chronic Obstructive Pulmonary Disease (COPD)  General Interventions   General Interventions Discussed/Reviewed General Interventions Reviewed, Doctor Visits  Doctor Visits Discussed/Reviewed Doctor Visits Discussed  [reviewed patient instructions from pulmonology visit and primary care provider visit.  discussed upcoming appointments]  Education Interventions   Education Provided Provided Education  Provided Verbal Education On Other  [discussed COPD action plan and when to call the doctor.]            SDOH assessments and interventions completed:  No  Care Coordination Interventions:  Yes, provided   Follow up plan: Follow up call scheduled for 11/02/22    Encounter Outcome:  Pt. Visit Completed   Thea Silversmith, RN, MSN, BSN, [place] Coordinator [phone]

## 2022-10-05 DIAGNOSIS — I5032 Chronic diastolic (congestive) heart failure: Secondary | ICD-10-CM | POA: Diagnosis not present

## 2022-10-05 DIAGNOSIS — D509 Iron deficiency anemia, unspecified: Secondary | ICD-10-CM | POA: Diagnosis not present

## 2022-10-05 DIAGNOSIS — J9621 Acute and chronic respiratory failure with hypoxia: Secondary | ICD-10-CM | POA: Diagnosis not present

## 2022-10-05 DIAGNOSIS — J441 Chronic obstructive pulmonary disease with (acute) exacerbation: Secondary | ICD-10-CM | POA: Diagnosis not present

## 2022-10-05 DIAGNOSIS — J9622 Acute and chronic respiratory failure with hypercapnia: Secondary | ICD-10-CM | POA: Diagnosis not present

## 2022-10-05 DIAGNOSIS — R7303 Prediabetes: Secondary | ICD-10-CM | POA: Diagnosis not present

## 2022-10-05 DIAGNOSIS — I130 Hypertensive heart and chronic kidney disease with heart failure and stage 1 through stage 4 chronic kidney disease, or unspecified chronic kidney disease: Secondary | ICD-10-CM | POA: Diagnosis not present

## 2022-10-05 DIAGNOSIS — D573 Sickle-cell trait: Secondary | ICD-10-CM | POA: Diagnosis not present

## 2022-10-05 DIAGNOSIS — G4733 Obstructive sleep apnea (adult) (pediatric): Secondary | ICD-10-CM | POA: Diagnosis not present

## 2022-10-07 ENCOUNTER — Telehealth: Payer: Self-pay | Admitting: Hematology

## 2022-10-07 DIAGNOSIS — G4733 Obstructive sleep apnea (adult) (pediatric): Secondary | ICD-10-CM | POA: Diagnosis not present

## 2022-10-07 DIAGNOSIS — J441 Chronic obstructive pulmonary disease with (acute) exacerbation: Secondary | ICD-10-CM | POA: Diagnosis not present

## 2022-10-07 DIAGNOSIS — D509 Iron deficiency anemia, unspecified: Secondary | ICD-10-CM | POA: Diagnosis not present

## 2022-10-07 DIAGNOSIS — I5032 Chronic diastolic (congestive) heart failure: Secondary | ICD-10-CM | POA: Diagnosis not present

## 2022-10-07 DIAGNOSIS — J9621 Acute and chronic respiratory failure with hypoxia: Secondary | ICD-10-CM | POA: Diagnosis not present

## 2022-10-07 DIAGNOSIS — R7303 Prediabetes: Secondary | ICD-10-CM | POA: Diagnosis not present

## 2022-10-07 DIAGNOSIS — I130 Hypertensive heart and chronic kidney disease with heart failure and stage 1 through stage 4 chronic kidney disease, or unspecified chronic kidney disease: Secondary | ICD-10-CM | POA: Diagnosis not present

## 2022-10-07 DIAGNOSIS — D573 Sickle-cell trait: Secondary | ICD-10-CM | POA: Diagnosis not present

## 2022-10-07 DIAGNOSIS — J9622 Acute and chronic respiratory failure with hypercapnia: Secondary | ICD-10-CM | POA: Diagnosis not present

## 2022-10-07 NOTE — Telephone Encounter (Signed)
Called patient per provider PAL to reschedule 5/24 appointments. Patient rescheduled and notified.

## 2022-10-08 ENCOUNTER — Ambulatory Visit: Payer: Medicare HMO | Admitting: Internal Medicine

## 2022-10-08 ENCOUNTER — Encounter: Payer: Self-pay | Admitting: Internal Medicine

## 2022-10-08 VITALS — BP 122/74 | HR 70 | Temp 97.8°F | Ht 66.0 in | Wt 327.4 lb

## 2022-10-08 DIAGNOSIS — J9611 Chronic respiratory failure with hypoxia: Secondary | ICD-10-CM

## 2022-10-08 DIAGNOSIS — J441 Chronic obstructive pulmonary disease with (acute) exacerbation: Secondary | ICD-10-CM

## 2022-10-08 DIAGNOSIS — J9612 Chronic respiratory failure with hypercapnia: Secondary | ICD-10-CM | POA: Diagnosis not present

## 2022-10-08 DIAGNOSIS — J45901 Unspecified asthma with (acute) exacerbation: Secondary | ICD-10-CM | POA: Diagnosis not present

## 2022-10-08 MED ORDER — BUDESONIDE 0.25 MG/2ML IN SUSP: RESPIRATORY_TRACT | 12 refills | Status: DC

## 2022-10-08 NOTE — Progress Notes (Signed)
Subjective:    Patient ID: Pamela [name], female    DOB: 01-27-1951  MRN: [id number]  Brief patient profile:  75 yobf never smoker but lots of second hand exp with 1st asthma attack in 1990s and on maint rx since late 90's  and freq courses of prednisone=  3-4 x per year despite advair and spiriva and freq saba referred to pulmonary clinic 04/27/2014 by Dr [name] s/p CT Bx 06/25/11 > no path report epic  but per oncology = [id number] adenoca of lung >  RT only per pt RUL completed 09/2011.     History of Present Illness  04/27/2014 1st [facility] office visit/ Pamela [name]   Chief Complaint  Patient presents with   Pulmonary Consult    Referred by Dr. [name]. [name]  Pt recently moved to [place] from [name] and states needs to establish with new pulmonary MD.  She c/o DOE and cough, and states that she feels these symptoms are currently under control.    on prednisone 10 mg daily  since late July 2015 and still on freq saba and 2lpm  Already used   2puffs proair am of ov  rec Stop spiriva and advair Start dulera 100 Take 2 puffs first thing in am and then another 2 puffs about 12 hours later.  Work on inhaler technique:  relax and gently blow all the way out then take a nice smooth deep breath back in, triggering the inhaler at same time you start breathing in.  Hold for up to 5 seconds if you can.  Rinse and gargle with water when done Only use your albuterol (proair) as a rescue medication  If proair not helping, then use the neb and if needing the neb more than occastional, then take prednisone 10 mg daily  Please schedule a follow up office visit in 4 weeks, sooner if needed with all inhalers in hand for pfts on return    Admit date: 08/16/2022 Discharge date: 08/25/2022      Recommendations for Outpatient Follow-up:  Follow up with PCP in 1-2 weeks Please obtain BMP/CBC in one week your next doctors visit.  Prednisone and  iron tablets prescribed.  Advised to follow-up outpatient pulmonary next 5 to 7 days. She tells me she is enough bronchodilators at home. 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  Over the course of several days patient started doing better, she completed course of doxycycline in the hospital.  IV Solu-Medrol was transitioned to p.o. prednisone. PT/OT recommended home health therefore arrangements were made.       Assessment & Plan:  Principal Problem:   COPD with acute exacerbation (HCC)   Chronic diastolic CHF (congestive heart failure) (HCC)   Stage 3b chronic kidney disease ([place])   Prediabetes   Obstructive sleep apnea   Acute on chronic respiratory failure with hypoxia and hypercapnia (HCC)   Vitamin D deficiency   Acute respiratory failure with hypercapnia (HCC)     Acute respiratory failure with hypoxia and hypercarbia likely due to COPD exacerbation:  Overall patient was very slow to improve and today her breath sounds are much clear.  She has completed course of doxycycline.  Transition to p.o. prednisone for 3 more days.  Continue using home bronchodilators, she tells me she has enough supplies at home.  I have advised she follow-up outpatient with her pulmonary next 5 to 7 days.     Chronic diastolic CHF (congestive heart failure) (HCC) BN P unremarkable, 2D echo showed an EF of 60% no wall motion abnormality. Continue Lasix 40 mg orally daily   Chronic kidney disease stage IIIb:  Creatinine around baseline of 1.4.  On daily iron at home         08/27/2022  [name] re: asthma/MO   maint on Trelelgy  200 finished prednisone/ refuses sleep aid other 02   Chief Complaint  Patient presents with   Hospitalization Follow-up    COPD exacerbation.  Patient states she is doing much better.  Improved SOB, Cough and wheeze  Dyspnea:  [employer] walking  baseline with care/ now use rollator due to balance  Cough: none though freq throat clearing  Sleeping: flat bed 4 pillows  SABA use: not using cpap and refuses to consider it again  02: 3lpm 24/7 was 2  Covid status:   vax x for last one,  had the infection 2020  Rec Pantoprazole (protonix) 40 mg   Take  30-60 min before first meal of the day and Pepcid (famotidine)  20 mg after supper until return to office   GERD diet reviewed, bed blocks rec  Please remember to go to the lab department   for your tests - we will call you with the results when they are available.  Please schedule a follow up office visit in 6 weeks, call sooner if needed with all medications /inhalers/ solutions in hand    10/08/2022  f/u ov/Pamela [name] re: asthma/M0   maint on nothing regularly (no trelegy/ montelukast )  and confused with maint vs  prns  Chief Complaint  Patient presents with   Follow-up    Doing well.  Some cough today.  Dyspnea:  [employer] walking and does  half a block at home with sats ok  on 3lpm  Cough: no producttion  Sleeping: flat bed 4 pillows  SABA use: rarely  02: 3lpm 24/7      No obvious day to day or daytime variability or assoc excess/ purulent sputum or mucus plugs or hemoptysis or cp or chest tightness, subjective wheeze or overt sinus or hb symptoms.   Sleeping  without nocturnal  or early am exacerbation  of respiratory  c/o's or need for noct saba. Also denies any obvious fluctuation of symptoms with weather or environmental changes or other aggravating or alleviating factors except as outlined above   No unusual exposure hx or h/o childhood pna/ asthma or knowledge of  premature birth.  Current Allergies, Complete Past Medical History, Past Surgical History, Family History, and Social History were reviewed in [employer] record.  ROS  The following are not active complaints unless bolded Hoarseness, sore throat, dysphagia, dental problems, itching, sneezing,  nasal congestion or discharge of excess mucus or purulent secretions, ear ache,   fever, chills, sweats, unintended wt loss or wt gain, classically pleuritic or exertional cp,  orthopnea pnd or arm/hand swelling  or leg swelling, presyncope, palpitations, abdominal pain, anorexia, nausea, vomiting, diarrhea  or change in bowel habits or change in bladder habits, change in stools  or change in urine, dysuria, hematuria,  rash, arthralgias, visual complaints, headache, numbness, weakness or ataxia or problems with walking or coordination,  change in mood or  memory.        Current Meds  Medication Sig   acetaminophen (TYLENOL) 500 MG tablet Take 1,000 mg by mouth every 6 (six) hours as needed for mild pain.   albuterol (PROAIR HFA) 108 (90 Base) MCG/ACT inhaler Inhale 2 puffs into the lungs every 4 (four) hours as needed for wheezing or shortness of breath.   albuterol (PROVENTIL) (2.5 MG/3ML) 0.083% nebulizer solution USE 1 VIAL IN NEBULIZER EVERY 6 HOURS AS NEEDED FOR WHEEZING FOR SHORTNESS OF BREATH (Patient taking differently: Take 2.5 mg by nebulization every 6 (six) hours as needed for shortness of breath.)   aspirin EC 81 MG tablet Take 1 tablet (81 mg total) by mouth daily. Swallow whole.   calcitRIOL (ROCALTROL) 0.25 MCG capsule Take 1 capsule (0.25 mcg total) by mouth daily.   cetirizine (ZYRTEC) 10 MG chewable tablet Chew 10 mg by mouth daily.   docusate sodium (STOOL SOFTENER) 100 MG capsule Take 100 mg by mouth daily.   famotidine (PEPCID) 20 MG tablet One after supper (Patient taking differently: Take 20 mg by mouth every evening. One after supper.  Takes PRN.)   febuxostat  (ULORIC) 40 MG tablet Take 1 tablet (40 mg total) by mouth daily.   furosemide (LASIX) 40 MG tablet Take 1 tablet (40 mg total) by mouth daily.   Incontinence Supply Disposable (DEPEND UNDERWEAR LARGE/XL) MISC 1 each by Does not apply route 2 times daily at 12 noon and 4 pm.   montelukast (SINGULAIR) 10 MG tablet Take 1 tablet (10 mg total) by mouth at bedtime.   pantoprazole (PROTONIX) 40 MG tablet Take 1 tablet (40 mg total) by mouth daily. Take 30-60 min before first meal of the day             Objective:   Physical Exam  Wts  10/08/2022   327  08/27/2022     314  11/12/2021   354  11/12/2020   342 05/13/2020 325  04/01/2020  330   02/23/2019    354   08/30/2018  366   08/02/2018  360   11/30/2014   343   Vital signs reviewed  10/08/2022  - Note at rest 02 sats  97% on 3 lpm    General appearance:    obese bf upper and lower airway wheeze    HEENT : Oropharynx  clear          NECK :  without  apparent JVD/ palpable Nodes/TM    LUNGS: no acc muscle use,  Nl contour chest  with isnp/exp rhonchi vs transmitted  noise from upper airway    CV:  RRR  no s3 or murmur or increase in P2, and no edema   ABD:  obese soft and nontender   MS:  Nl gait/ ext warm without deformities or obvious joint restrictions  calf tenderness, cyanosis or clubbing    SKIN: warm and dry without lesions    NEURO:  alert, approp, nl sensorium with  no motor or cerebellar deficits apparent.

## 2022-10-08 NOTE — Assessment & Plan Note (Signed)
Started 2lpm 24/7 in [place] around 2013  See pfts 07/19/2014 with completely reversible airflow obst (so this is not copd) and a dlco of 71% so this is not ILD - [facility]03   05/26/16  = 34 - HC03    02/06/17  = 27  - [facility]03   08/25/22    = 34 / refused sleep medicine eval/ any form or pos pressure vent     Again advised:  Make sure you check your oxygen saturation  AT  your highest level of activity (not after you stop)   to be sure it stays over 90% and adjust  02 flow upward to maintain this level if needed but remember to turn it back to previous settings when you stop (to conserve your supply).

## 2022-10-08 NOTE — Patient Instructions (Addendum)
Leave montelukast off to see what kind of year you have with your sinuses   If cough gets > Pantoprazole (protonix) 40 mg   Take  30-60 min before first meal of the day and Pepcid (famotidine)  20 mg after supper until cough is gone for at  least    Make sure you check your oxygen saturation  AT  your highest level of activity (not after you stop)   to be sure it stays over 90% and adjust  02 flow upward to maintain this level if needed but remember to turn it back to previous settings when you stop (to conserve your supply).   For short ness of breath, worse wheezing > albuterol nebulizer 2.5 mg twice daily with budesonide 0.25   Please schedule a follow up visit in 3 months but call sooner if needed

## 2022-10-08 NOTE — Assessment & Plan Note (Signed)
Onset asthma 1990's while living in [name]  pfts 07/31/13  FEV1  1.53 (58%) with ratio 66 > p saba ratio 74 FEV1 1.68 (64%)  - -04/27/2014  try dulera 100 2 bid  - PFT's 07/19/2014 FEV1 2.00 (93%) and ratio 85 after 21% improvement from saba with no inhalers x one week  - trial off acei 12/01/2014 due to pseudoasthma component > resolved  - Spirometry 06/01/2016  FEV1 1.06 (48%)  Ratio 61  - FENO 06/01/2016  =   85 on symbicort 80 x2 > increase to 160  - Prednisone 10 mg floor x years, rec increase  to 20 mg if needing neb  - 10/15/2016 reduced pred to 5 mg daily (gout flares with taper)  - Spirometry 02/22/2018  FEV1 1.23 (57%)  Ratio 71 with min curvature  - FENO 02/22/2018  = 61 08/02/2018  After extensive coaching inhaler device,  effectiveness =    90%  FENO 08/30/2018  =  57 p symb 160 2bid > rec add singulair 10 mg daily  PFT's  08/30/2018  FEV1 1.69 (78 % ) ratio 0.79  p 54 % improvement from saba p symb 160 x2  prior to study with DLCO  112 % corrects to 154  % for alv volume   - 04/01/2020  Added pred x 6 days as Plan d  - 05/13/2020  After extensive coaching inhaler device,  effectiveness =    25%   DDX of  difficult airways management almost all start with A and  include Adherence, Ace Inhibitors, Acid Reflux, Active Sinus Disease, Alpha 1 Antitripsin deficiency, Anxiety masquerading as Airways dz,  ABPA,  Allergy(esp in young), Aspiration (esp in elderly), Adverse effects of meds,  Active smoking or vaping, A bunch of PE's (a small clot burden can't cause this syndrome unless there is already severe underlying pulm or vascular dz with poor reserve) plus two Bs  = Bronchiectasis and Beta blocker use..and one C= CHF    Adherence is always the initial "prime suspect" and is a multilayered concern that requires a "trust but verify" approach in every patient - starting with knowing how to use medications, especially inhalers, correctly, keeping up with refills and understanding the fundamental  difference between maintenance and prns vs those medications only taken for a very short course and then stopped and not refilled.  - not able to use inhalers correctly   ? Acid (or non-acid) GERD > always difficult to exclude as up to 75% of pts in some series report no assoc GI/ Heartburn symptoms> rec max (24h)  acid suppression and diet restrictions/ reviewed and instructions given in writing.   Allergy / asthma > try adding bud 0.25 mg to neb at a minimum of bid for now but ok to increase up to q4h which would be the equivalent of AirSupra an a fraction of the cost.

## 2022-10-08 NOTE — Assessment & Plan Note (Signed)
Complicated by hbp/ pre-dm  Body mass index is 52.84 kg/m.  -  trending up Lab Results  Component Value Date   TSH 2.810 09/24/2022      Contributing to doe and risk of GERD >>>   reviewed the need and the process to achieve and maintain neg calorie balance > defer f/u primary care including intermittently monitoring thyroid status            Each maintenance medication was reviewed in detail including emphasizing most importantly the difference between maintenance and prns and under what circumstances the prns are to be triggered using an action plan format where appropriate.  Total time for H and P, chart review, counseling, reviewing neb device(s) and generating customized AVS unique to this office visit / same day charting = 30 min

## 2022-10-09 DIAGNOSIS — J9622 Acute and chronic respiratory failure with hypercapnia: Secondary | ICD-10-CM | POA: Diagnosis not present

## 2022-10-09 DIAGNOSIS — I5032 Chronic diastolic (congestive) heart failure: Secondary | ICD-10-CM | POA: Diagnosis not present

## 2022-10-09 DIAGNOSIS — D509 Iron deficiency anemia, unspecified: Secondary | ICD-10-CM | POA: Diagnosis not present

## 2022-10-09 DIAGNOSIS — J441 Chronic obstructive pulmonary disease with (acute) exacerbation: Secondary | ICD-10-CM | POA: Diagnosis not present

## 2022-10-09 DIAGNOSIS — I130 Hypertensive heart and chronic kidney disease with heart failure and stage 1 through stage 4 chronic kidney disease, or unspecified chronic kidney disease: Secondary | ICD-10-CM | POA: Diagnosis not present

## 2022-10-09 DIAGNOSIS — R7303 Prediabetes: Secondary | ICD-10-CM | POA: Diagnosis not present

## 2022-10-09 DIAGNOSIS — D573 Sickle-cell trait: Secondary | ICD-10-CM | POA: Diagnosis not present

## 2022-10-09 DIAGNOSIS — J9621 Acute and chronic respiratory failure with hypoxia: Secondary | ICD-10-CM | POA: Diagnosis not present

## 2022-10-09 DIAGNOSIS — G4733 Obstructive sleep apnea (adult) (pediatric): Secondary | ICD-10-CM | POA: Diagnosis not present

## 2022-10-13 DIAGNOSIS — I5032 Chronic diastolic (congestive) heart failure: Secondary | ICD-10-CM | POA: Diagnosis not present

## 2022-10-13 DIAGNOSIS — I130 Hypertensive heart and chronic kidney disease with heart failure and stage 1 through stage 4 chronic kidney disease, or unspecified chronic kidney disease: Secondary | ICD-10-CM | POA: Diagnosis not present

## 2022-10-13 DIAGNOSIS — R7303 Prediabetes: Secondary | ICD-10-CM | POA: Diagnosis not present

## 2022-10-13 DIAGNOSIS — J9622 Acute and chronic respiratory failure with hypercapnia: Secondary | ICD-10-CM | POA: Diagnosis not present

## 2022-10-13 DIAGNOSIS — D509 Iron deficiency anemia, unspecified: Secondary | ICD-10-CM | POA: Diagnosis not present

## 2022-10-13 DIAGNOSIS — J9621 Acute and chronic respiratory failure with hypoxia: Secondary | ICD-10-CM | POA: Diagnosis not present

## 2022-10-13 DIAGNOSIS — G4733 Obstructive sleep apnea (adult) (pediatric): Secondary | ICD-10-CM | POA: Diagnosis not present

## 2022-10-13 DIAGNOSIS — D573 Sickle-cell trait: Secondary | ICD-10-CM | POA: Diagnosis not present

## 2022-10-13 DIAGNOSIS — J441 Chronic obstructive pulmonary disease with (acute) exacerbation: Secondary | ICD-10-CM | POA: Diagnosis not present

## 2022-10-15 DIAGNOSIS — J9622 Acute and chronic respiratory failure with hypercapnia: Secondary | ICD-10-CM | POA: Diagnosis not present

## 2022-10-15 DIAGNOSIS — G4733 Obstructive sleep apnea (adult) (pediatric): Secondary | ICD-10-CM | POA: Diagnosis not present

## 2022-10-15 DIAGNOSIS — I130 Hypertensive heart and chronic kidney disease with heart failure and stage 1 through stage 4 chronic kidney disease, or unspecified chronic kidney disease: Secondary | ICD-10-CM | POA: Diagnosis not present

## 2022-10-15 DIAGNOSIS — D509 Iron deficiency anemia, unspecified: Secondary | ICD-10-CM | POA: Diagnosis not present

## 2022-10-15 DIAGNOSIS — I5032 Chronic diastolic (congestive) heart failure: Secondary | ICD-10-CM | POA: Diagnosis not present

## 2022-10-15 DIAGNOSIS — D573 Sickle-cell trait: Secondary | ICD-10-CM | POA: Diagnosis not present

## 2022-10-15 DIAGNOSIS — R7303 Prediabetes: Secondary | ICD-10-CM | POA: Diagnosis not present

## 2022-10-15 DIAGNOSIS — J9621 Acute and chronic respiratory failure with hypoxia: Secondary | ICD-10-CM | POA: Diagnosis not present

## 2022-10-15 DIAGNOSIS — J441 Chronic obstructive pulmonary disease with (acute) exacerbation: Secondary | ICD-10-CM | POA: Diagnosis not present

## 2022-10-22 DIAGNOSIS — J441 Chronic obstructive pulmonary disease with (acute) exacerbation: Secondary | ICD-10-CM | POA: Diagnosis not present

## 2022-10-22 DIAGNOSIS — R7303 Prediabetes: Secondary | ICD-10-CM | POA: Diagnosis not present

## 2022-10-22 DIAGNOSIS — I130 Hypertensive heart and chronic kidney disease with heart failure and stage 1 through stage 4 chronic kidney disease, or unspecified chronic kidney disease: Secondary | ICD-10-CM | POA: Diagnosis not present

## 2022-10-22 DIAGNOSIS — I5032 Chronic diastolic (congestive) heart failure: Secondary | ICD-10-CM | POA: Diagnosis not present

## 2022-10-22 DIAGNOSIS — D573 Sickle-cell trait: Secondary | ICD-10-CM | POA: Diagnosis not present

## 2022-10-22 DIAGNOSIS — J9622 Acute and chronic respiratory failure with hypercapnia: Secondary | ICD-10-CM | POA: Diagnosis not present

## 2022-10-22 DIAGNOSIS — G4733 Obstructive sleep apnea (adult) (pediatric): Secondary | ICD-10-CM | POA: Diagnosis not present

## 2022-10-22 DIAGNOSIS — D509 Iron deficiency anemia, unspecified: Secondary | ICD-10-CM | POA: Diagnosis not present

## 2022-10-22 DIAGNOSIS — J9621 Acute and chronic respiratory failure with hypoxia: Secondary | ICD-10-CM | POA: Diagnosis not present

## 2022-10-27 ENCOUNTER — Ambulatory Visit (INDEPENDENT_AMBULATORY_CARE_PROVIDER_SITE_OTHER): Payer: Medicare HMO | Admitting: Family Medicine

## 2022-10-27 ENCOUNTER — Encounter: Payer: Self-pay | Admitting: Family Medicine

## 2022-10-27 VITALS — BP 98/52 | HR 67 | Temp 97.2°F | Ht 66.0 in | Wt 336.6 lb

## 2022-10-27 DIAGNOSIS — Z6841 Body Mass Index (BMI) 40.0 and over, adult: Secondary | ICD-10-CM | POA: Diagnosis not present

## 2022-10-27 DIAGNOSIS — I10 Essential (primary) hypertension: Secondary | ICD-10-CM

## 2022-10-27 DIAGNOSIS — J9612 Chronic respiratory failure with hypercapnia: Secondary | ICD-10-CM | POA: Diagnosis not present

## 2022-10-27 DIAGNOSIS — Z8709 Personal history of other diseases of the respiratory system: Secondary | ICD-10-CM | POA: Diagnosis not present

## 2022-10-27 DIAGNOSIS — J9611 Chronic respiratory failure with hypoxia: Secondary | ICD-10-CM

## 2022-10-27 DIAGNOSIS — N183 Chronic kidney disease, stage 3 unspecified: Secondary | ICD-10-CM

## 2022-10-27 LAB — POCT URINALYSIS DIPSTICK
Bilirubin, UA: NEGATIVE
Blood, UA: NEGATIVE
Glucose, UA: NEGATIVE
Ketones, UA: NEGATIVE
Leukocytes, UA: NEGATIVE
Nitrite, UA: NEGATIVE
Protein, UA: NEGATIVE
Spec Grav, UA: 1.015 (ref 1.010–1.025)
Urobilinogen, UA: 0.2 E.U./dL
pH, UA: 7.5 (ref 5.0–8.0)

## 2022-10-27 NOTE — Progress Notes (Signed)
Established Patient Office Visit  Subjective   Patient ID: Laquandra Vought, female    DOB: Nov 07, 1950  Age: 72 y.o. MRN: [id number]  Chief Complaint  Patient presents with   Hyperlipidemia    Follow up    Bawi Bishara is a 72 year old female with a medical history significant for essential hypertension, COPD, severe persistent asthma, history of lung cancer, morbid obesity, chronic respiratory failure with hypoxia, stage IIIb chronic kidney disease presents for follow-up of her chronic conditions.  Ms. [name] states that she is doing well on today.  She recently had an exacerbation of COPD requiring hospitalization.  Following discharge, her pulmonologist added an additional daily inhaler to improve symptoms.  Patient says that symptoms have dissipated.  She denies any wheezing, shortness of breath, or persistent coughing. Essential hypertension: Blood pressure is stable on current medication regimen.  Patient has chronic lower extremity edema.  No chest pain, shortness of breath, or heart palpitations today.  Patient has history of stage III chronic kidney disease and is followed closely by nephrology. Patient has a history of severe persistent asthma.  She is followed by pulmonology.  Patient has been using prescribed inhalers consistently.  She last had an exacerbation 1 month ago.    Patient Active Problem List   Diagnosis Date Noted   Severe persistent chronic asthma without complication 09/08/2022   Sickle-cell trait 09/08/2022   Acute respiratory failure with hypercapnia 08/17/2022   COPD exacerbation 04/04/2022   Vitamin D deficiency 02/02/2022   Stage 3b chronic kidney disease 09/24/2020   Malignant neoplasm of lung 09/24/2020   Aortic atherosclerosis 09/24/2020   PFO (patent foramen ovale) 09/24/2020   History of stroke 09/24/2020   Acute respiratory failure with hypoxia 12/17/2019   COPD with acute exacerbation 12/17/2019   COVID-19 virus infection 01/02/2019   Right  knee pain 09/15/2018   Closed head injury with concussion 09/06/2018   Hypokalemia 09/06/2018   Hypomagnesemia 09/06/2018   Asthma, chronic obstructive, with acute exacerbation 08/02/2018   Plantar fasciitis 04/14/2017   Microcytosis 11/28/2015   Solitary kidney 07/18/2015   Onychomycosis 01/09/2015   Ingrown nail 01/09/2015   Pain in lower limb 01/09/2015   Chronic respiratory failure with hypoxia and hypercapnia 07/19/2014   Acute on chronic respiratory failure with hypoxia and hypercapnia 06/24/2014   Anemia, iron deficiency 05/27/2014   Acute gouty arthritis 05/25/2014   Obstructive sleep apnea 05/25/2014   Joint pain 05/24/2014   Chronic diastolic CHF (congestive heart failure)    Sarcoidosis    Metabolic syndrome 05/21/2014   Asthma, chronic 04/27/2014   Anemia of chronic disease 04/19/2014   Morbid obesity 04/18/2014   Immunization due 04/18/2014   Need for prophylactic vaccination and inoculation against influenza 04/18/2014   Prediabetes 04/13/2014   Essential hypertension 04/13/2014   Lower extremity edema 04/13/2014   History of lung cancer 04/13/2014   Past Medical History:  Diagnosis Date   Arthritis    Asthma    Cancer    lung, adenocarcinoma right lung 2012   CHF (congestive heart failure)    Preserved EF   Congenital single kidney    With chronic kidney disease   COPD (chronic obstructive pulmonary disease)    Gout    Hypertension    Lung cancer 2012   Right upper lobe lung adenocarcinoma diagnosed with needle biopsy treated by SBRT finished treatment April 2013 has been monitored since   Mass of chest wall, right    Right chest wall mass 7.3 cm biopsy  on 12/13/2013. Patient notes it was consistent with sarcoidosis but actual pathology results not available.   Oxygen deficiency    Sarcoidosis    Sickle cell trait    Sleep apnea    Past Surgical History:  Procedure Laterality Date   LOOP RECORDER INSERTION N/A 09/08/2018   Procedure: LOOP RECORDER  INSERTION;  Surgeon: Marinus Maw, MD;  Location: [facility];  Service: Cardiovascular;  Laterality: N/A;   LUNG BIOPSY     TEE WITHOUT CARDIOVERSION N/A 09/08/2018   Procedure: TRANSESOPHAGEAL ECHOCARDIOGRAM (TEE);  Surgeon: Wendall Stade, MD;  Location: [facility] END[facility];  Service: Cardiovascular;  Laterality: N/A;  with loop   TUBAL LIGATION     VEIN LIGATION AND STRIPPING     Social History   Tobacco Use   Smoking status: Never   Smokeless tobacco: Never  Vaping Use   Vaping Use: Never used  Substance Use Topics   Alcohol use: No    Alcohol/week: 0.0 standard drinks of alcohol   Drug use: No   Social History   Socioeconomic History   Marital status: Divorced    Spouse name: Not on file   Number of children: 2   Years of education: Not on file   Highest education level: Not on file  Occupational History   Not on file  Tobacco Use   Smoking status: Never   Smokeless tobacco: Never  Vaping Use   Vaping Use: Never used  Substance and Sexual Activity   Alcohol use: No    Alcohol/week: 0.0 standard drinks of alcohol   Drug use: No   Sexual activity: Not Currently  Other Topics Concern   Not on file  Social History Narrative   Lives with son.  One son is deceased.     Social Determinants of Health   Financial Resource Strain: Low Risk  (07/15/2022)   Overall Financial Resource Strain (CARDIA)    Difficulty of Paying Living Expenses: Not hard at all  Food Insecurity: No Food Insecurity (08/26/2022)   Hunger Vital Sign    Worried About Running Out of Food in the Last Year: Never true    Ran Out of Food in the Last Year: Never true  Transportation Needs: No Transportation Needs (08/26/2022)   PRAPARE - Administrator, Civil Service (Medical): No    Lack of Transportation (Non-Medical): No  Physical Activity: Inactive (07/15/2022)   Exercise Vital Sign    Days of Exercise per Week: 0 days    Minutes of Exercise per Session: 0 min  Stress: No Stress  Concern Present (07/15/2022)   [employer] of Occupational Health - Occupational Stress Questionnaire    Feeling of Stress : Not at all  Social Connections: Moderately Integrated (07/15/2022)   Social Connection and Isolation Panel [NHANES]    Frequency of Communication with Friends and Family: More than three times a week    Frequency of Social Gatherings with Friends and Family: More than three times a week    Attends Religious Services: More than 4 times per year    Active Member of [employer] or Organizations: Yes    Attends Banker Meetings: More than 4 times per year    Marital Status: Divorced  Intimate Partner Violence: Not At Risk (08/17/2022)   Humiliation, Afraid, Rape, and Kick questionnaire    Fear of Current or Ex-Partner: No    Emotionally Abused: No    Physically Abused: No    Sexually Abused: No  Family Status  Relation Name Status   Mother  Deceased   Father  Deceased   Sister  (Not Specified)   Sister  (Not Specified)   Brother  (Not Specified)   Family History  Problem Relation Age of Onset   Hypertension Mother    Renal Disease Mother    High blood pressure Mother    Heart disease Mother    Cancer Father        stomach   Heart disease Father        No details   Cervical cancer Sister    Diabetes Sister    Multiple myeloma Sister    Cancer Brother    Diabetes Brother    Allergies  Allergen Reactions   Sulfa Antibiotics Hives and Rash      Review of Systems  Constitutional: Negative.   HENT: Negative.    Eyes: Negative.   Respiratory: Negative.    Cardiovascular: Negative.   Gastrointestinal: Negative.   Genitourinary: Negative.   Musculoskeletal: Negative.   Skin: Negative.   Neurological: Negative.   Endo/Heme/Allergies: Negative.   Psychiatric/Behavioral: Negative.        Objective:     BP (!) 98/52   Pulse 67   Temp (!) 97.2 F (36.2 C)   Ht 5\' 6"  (1.676 m)   Wt (!) 336 lb 9.6 oz (152.7 kg)   SpO2 100%    BMI 54.33 kg/m  BP Readings from Last 3 Encounters:  10/27/22 (!) 98/52  10/08/22 122/74  09/08/22 109/66   Wt Readings from Last 3 Encounters:  10/27/22 (!) 336 lb 9.6 oz (152.7 kg)  10/08/22 (!) 327 lb 6.4 oz (148.5 kg)  09/24/22 (!) 329 lb (149.2 kg)      Physical Exam Constitutional:      Appearance: She is obese.  Eyes:     Pupils: Pupils are equal, round, and reactive to light.  Cardiovascular:     Rate and Rhythm: Normal rate and regular rhythm.  Pulmonary:     Effort: Pulmonary effort is normal.  Abdominal:     General: Bowel sounds are normal.  Musculoskeletal:        General: Normal range of motion.  Skin:    General: Skin is warm.  Neurological:     General: No focal deficit present.     Mental Status: Mental status is at baseline.  Psychiatric:        Mood and Affect: Mood normal.        Behavior: Behavior normal.        Thought Content: Thought content normal.        Judgment: Judgment normal.      Results for orders placed or performed in visit on 10/27/22  Urinalysis Dipstick  Result Value Ref Range   Color, UA yellow    Clarity, UA clear    Glucose, UA Negative Negative   Bilirubin, UA negative    Ketones, UA negative    Spec Grav, UA 1.015 1.010 - 1.025   Blood, UA negative    pH, UA 7.5 5.0 - 8.0   Protein, UA Negative Negative   Urobilinogen, UA 0.2 0.2 or 1.0 E.U./dL   Nitrite, UA negative    Leukocytes, UA Negative Negative   Appearance     Odor      Last CBC Lab Results  Component Value Date   WBC 7.8 08/27/2022   HGB 13.0 08/27/2022   HCT 41.4 08/27/2022   MCV 70.4 (L) 08/27/2022  MCH 21.7 (L) 08/25/2022   RDW 15.2 08/27/2022   PLT 176.0 08/27/2022   Last metabolic panel Lab Results  Component Value Date   GLUCOSE 85 09/24/2022   NA 141 09/24/2022   K 4.1 09/24/2022   CL 100 09/24/2022   CO2 26 09/24/2022   BUN 16 09/24/2022   CREATININE 1.47 (H) 09/24/2022   EGFR 38 (L) 09/24/2022   CALCIUM 8.1 (L) 09/24/2022    PHOS 4.2 12/19/2019   PROT 5.7 (L) 09/24/2022   ALBUMIN 3.7 (L) 09/24/2022   LABGLOB 2.0 09/24/2022   AGRATIO 1.9 09/24/2022   BILITOT <0.2 09/24/2022   ALKPHOS 173 (H) 09/24/2022   AST 18 09/24/2022   ALT 12 09/24/2022   ANIONGAP 11 08/25/2022   Last lipids Lab Results  Component Value Date   CHOL 166 01/29/2022   HDL 55 01/29/2022   LDLCALC 96 01/29/2022   TRIG 78 01/29/2022   CHOLHDL 2.7 03/01/2019   Last hemoglobin A1c Lab Results  Component Value Date   HGBA1C 5.6 09/24/2022   Last thyroid functions Lab Results  Component Value Date   TSH 2.810 09/24/2022   T4TOTAL 6.8 09/24/2022   Last vitamin D Lab Results  Component Value Date   VD25OH 8.3 (L) 01/29/2022   Last vitamin B12 and Folate Lab Results  Component Value Date   VITAMINB12 830 01/29/2022   FOLATE 13.8 05/25/2014      The ASCVD Risk score (Arnett DK, et al., 2019) failed to calculate for the following reasons:   The patient has a prior MI or stroke diagnosis    Assessment & Plan:   Problem List Items Addressed This Visit       Cardiovascular and Mediastinum   Essential hypertension - Primary   Relevant Orders   Urinalysis Dipstick (Completed)   Comprehensive metabolic panel   Lipid Panel     Respiratory   Chronic respiratory failure with hypoxia and hypercapnia   Other Visit Diagnoses     Stage 3 chronic kidney disease, unspecified whether stage 3a or 3b CKD       Relevant Orders   Comprehensive metabolic panel   BMI 50.0-59.9, adult       History of COPD          1. Essential hypertension BP (!) 98/52   Pulse 67   Temp (!) 97.2 F (36.2 C)   Ht 5\' 6"  (1.676 m)   Wt (!) 336 lb 9.6 oz (152.7 kg)   SpO2 100%   BMI 54.33 kg/m  - Continue medication, monitor blood pressure at home. Continue DASH diet.  Reminder to go to the ER if any CP, SOB, nausea, dizziness, severe HA, changes vision/speech, left arm numbness and tingling and jaw pain. ,.a - Urinalysis Dipstick -  Comprehensive metabolic panel - Lipid Panel  2. Stage 3 chronic kidney disease, unspecified whether stage 3a or 3b CKD Schedule follow-up with nephrologist - Comprehensive metabolic panel  3. BMI 50.0-59.9, adult Recommend that patient restarts visits with the dietary team.  She has been lost to follow-up since September.The patient is asked to make an attempt to improve diet and exercise patterns to aid in medical management of this problem.   4. History of COPD Continue to follow-up with pulmonology as scheduled  5. Chronic respiratory failure with hypoxia and hypercapnia Continue home oxygen at 2 L/min  Follow-up in 3 months   Arriyanna Mersch Rennis PettyMoore Hattie Aguinaldo  APRN, MSN, FNP-C [facility] [address]  [place], [place] [address] [phone]

## 2022-10-28 LAB — COMPREHENSIVE METABOLIC PANEL
ALT: 10 IU/L (ref 0–32)
AST: 19 IU/L (ref 0–40)
Albumin/Globulin Ratio: 1.7 (ref 1.2–2.2)
Albumin: 3.8 g/dL — ABNORMAL LOW (ref 3.9–4.9)
Alkaline Phosphatase: 131 IU/L — ABNORMAL HIGH (ref 44–121)
BUN/Creatinine Ratio: 15 (ref 12–28)
BUN: 21 mg/dL (ref 8–27)
Bilirubin Total: 0.5 mg/dL (ref 0.0–1.2)
CO2: 26 mmol/L (ref 20–29)
Calcium: 8.6 mg/dL — ABNORMAL LOW (ref 8.7–10.3)
Chloride: 104 mmol/L (ref 96–106)
Creatinine, Ser: 1.4 mg/dL — ABNORMAL HIGH (ref 0.57–1.00)
Globulin, Total: 2.3 g/dL (ref 1.5–4.5)
Glucose: 87 mg/dL (ref 70–99)
Potassium: 4.1 mmol/L (ref 3.5–5.2)
Sodium: 143 mmol/L (ref 134–144)
Total Protein: 6.1 g/dL (ref 6.0–8.5)
eGFR: 41 mL/min/{1.73_m2} — ABNORMAL LOW (ref >59)

## 2022-10-28 LAB — LIPID PANEL
Chol/HDL Ratio: 3.4 ratio (ref 0.0–4.4)
Cholesterol, Total: 183 mg/dL (ref 100–199)
HDL: 54 mg/dL (ref >39)
LDL Chol Calc (NIH): 112 mg/dL — ABNORMAL HIGH (ref 0–99)
Triglycerides: 92 mg/dL (ref 0–149)
VLDL Cholesterol Cal: 17 mg/dL (ref 5–40)

## 2022-10-28 NOTE — Progress Notes (Signed)
Pamela [name] is a 72 year old female with a medical history significant for essential hypertension, obesity, history of sarcoidosis, asthma, and peripheral edema was evaluated in clinic on 10/27/2022. Reviewed cholesterol panel, LDL cholesterol slightly elevated at 110, goal is less than 100.  Recommend a carbohydrate modified diet divided over small meals throughout the day.  Also, increase water intake. No other medication changes warranted today. Will recheck cholesterol in 6 months.  Nolon Nations  APRN, MSN, FNP-C Patient Care [facility] [address] [place], [place] [address] [phone]

## 2022-11-02 ENCOUNTER — Ambulatory Visit: Payer: Self-pay

## 2022-11-02 NOTE — Patient Instructions (Signed)
Visit Information  Thank you for taking time to visit with me today. Please don't hesitate to contact me if I can be of assistance to you.   Following are the goals we discussed today:   Goals Addressed             This Visit's Progress    COMPLETED: Continue to improve post hospitalization       Interventions Today    Flowsheet Row Most Recent Value  Chronic Disease   Chronic disease during today's visit Chronic Obstructive Pulmonary Disease (COPD)  General Interventions   General Interventions Discussed/Reviewed General Interventions Reviewed, Doctor Visits, Durable Medical Equipment (DME)  [advised to contact RNCM if care coordination needs in the future. provided RNCM's contact number.]  Doctor Visits Discussed/Reviewed Doctor Visits Reviewed, Specialist  [reviewed office visit instruction from PCP and pulmonologist. Ms. Hubert voiced understanding.]  Durable Medical Equipment (DME) Oxygen  [reveiwed Pulmonary insturctions: check your (2 sat at highest level of activity (not after you stop) be sure it stays >90%-adjust  02 flow upward to maintain this level if needed.turn it back to previous settings when you stop (to conserve your supply).]  PCP/Specialist Visits Compliance with follow-up visit  Exercise Interventions   Exercise Discussed/Reviewed Weight Managment  Weight Management Weight loss  [reiterated instructions per PCP to contact weight management clinic to schedule follow up. Ms. Jendro states she is planning to call to schedule for next month, adding she is confirming that she has insurance coverage also.]  Education Interventions   Education Provided Provided Education  Provided Verbal Education On When to see the doctor  [advised: eating healthy and monitoring health management during upcoming birthday celebration in order to maintain health, reviewed PCP and pulmonologist recommendations from recent office visits.]  Nutrition Interventions   Nutrition  Discussed/Reviewed Nutrition Discussed  [reviewed PCP office visit instructions.]  Pharmacy Interventions   Pharmacy Dicussed/Reviewed Pharmacy Topics Reviewed            If you are experiencing a Mental Health or Behavioral Health Crisis or need someone to talk to, please call the Suicide and Crisis Lifeline: 56  Kathyrn Sheriff, RN, MSN, BSN, CCM [facility] Care Coordinator [phone]

## 2022-11-02 NOTE — Patient Outreach (Signed)
  Care Coordination   Follow Up Visit Note   11/02/2022 Name: Pamela [name] MRN: [id number] DOB: 14-May-1951  Pamela [name] is a 72 y.o. year old female who sees Pamela Maroon, FNP for primary care. I spoke with  Pamela [name] by phone today.  What matters to the patients health and wellness today?  Pamela [name] states, "I am doing great". She expressed excitement regarding her upcoming birthday. She denies any questions or concerns. Patient to contact RNCM and/or primary care provider if care coordination needs in the future. RNCM contact number provided.  Goals Addressed             This Visit's Progress    COMPLETED: Continue to improve post hospitalization       Interventions Today    Flowsheet Row Most Recent Value  Chronic Disease   Chronic disease during today's visit Chronic Obstructive Pulmonary Disease (COPD)  General Interventions   General Interventions Discussed/Reviewed General Interventions Reviewed, Doctor Visits, Durable Medical Equipment (DME)  [advised to contact RNCM if care coordination needs in the future. provided RNCM's contact number.]  Doctor Visits Discussed/Reviewed Doctor Visits Reviewed, Specialist  [reviewed office visit instruction from PCP and pulmonologist. Pamela [name] voiced understanding.]  Durable Medical Equipment (DME) Oxygen  [reveiwed Pulmonary insturctions: check your (2 sat at highest level of activity (not after you stop) be sure it stays >90%-adjust  02 flow upward to maintain this level if needed.turn it back to previous settings when you stop (to conserve your supply).]  PCP/Specialist Visits Compliance with follow-up visit  Exercise Interventions   Exercise Discussed/Reviewed Weight Managment  Weight Management Weight loss  [reiterated instructions per PCP to contact weight management clinic to schedule follow up. Pamela [name] states she is planning to call to schedule for next month, adding she is confirming that she has insurance coverage  also.]  Education Interventions   Education Provided Provided Education  Provided Verbal Education On When to see the doctor  [advised: eating healthy and monitoring health management during upcoming birthday celebration in order to maintain health, reviewed PCP and pulmonologist recommendations from recent office visits.]  Nutrition Interventions   Nutrition Discussed/Reviewed Nutrition Discussed  [reviewed PCP office visit instructions.]  Pharmacy Interventions   Pharmacy Dicussed/Reviewed Pharmacy Topics Reviewed            SDOH assessments and interventions completed:  No  Care Coordination Interventions:  Yes, provided   Follow up plan: No further intervention required.   Encounter Outcome:  Pt. Visit Completed   Pamela Sheriff, RN, MSN, BSN, CCM [facility] Care Coordinator [phone]

## 2022-11-03 ENCOUNTER — Other Ambulatory Visit: Payer: Self-pay | Admitting: *Deleted

## 2022-11-03 ENCOUNTER — Telehealth: Payer: Self-pay | Admitting: Family Medicine

## 2022-11-03 ENCOUNTER — Other Ambulatory Visit: Payer: Self-pay | Admitting: Family Medicine

## 2022-11-03 DIAGNOSIS — M109 Gout, unspecified: Secondary | ICD-10-CM

## 2022-11-03 MED ORDER — MONTELUKAST SODIUM 10 MG PO TABS: 10.0000 mg | ORAL_TABLET | Freq: Every day | ORAL | 3 refills | Status: DC

## 2022-11-03 MED ORDER — FEBUXOSTAT 40 MG PO TABS: 40.0000 mg | ORAL_TABLET | Freq: Every day | ORAL | 1 refills | Status: DC

## 2022-11-03 NOTE — Telephone Encounter (Signed)
Caller & Relationship to patient:  MRN #  [id number]   Call Back Number:   Date of Last Office Visit: 10/27/2022     Date of Next Office Visit: 01/26/2023    Medication(s) to be Refilled: Gaut med to be refilled  Preferred Pharmacy:   ** Please notify patient to allow 48-72 hours to process** **Let patient know to contact pharmacy at the end of the day to make sure medication is ready. ** **If patient has not been seen in a year or longer, book an appointment **Advise to use MyChart for refill requests OR to contact their pharmacy

## 2022-11-11 ENCOUNTER — Other Ambulatory Visit: Payer: Self-pay | Admitting: Internal Medicine

## 2022-11-12 ENCOUNTER — Other Ambulatory Visit: Payer: Self-pay

## 2022-11-13 ENCOUNTER — Other Ambulatory Visit: Payer: Self-pay | Admitting: Hematology

## 2022-11-13 DIAGNOSIS — C3490 Malignant neoplasm of unspecified part of unspecified bronchus or lung: Secondary | ICD-10-CM

## 2022-11-13 NOTE — Progress Notes (Signed)
CT chest without contrast ordered.

## 2022-11-29 ENCOUNTER — Other Ambulatory Visit: Payer: Self-pay | Admitting: Family Medicine

## 2022-11-29 DIAGNOSIS — I5030 Unspecified diastolic (congestive) heart failure: Secondary | ICD-10-CM

## 2022-11-29 MED ORDER — FUROSEMIDE 40 MG PO TABS: 40.0000 mg | ORAL_TABLET | Freq: Every day | ORAL | 1 refills | Status: DC

## 2022-11-29 MED ORDER — FUROSEMIDE 40 MG PO TABS
40.0000 mg | ORAL_TABLET | Freq: Every day | ORAL | 1 refills | Status: DC
Start: 2022-11-29 — End: 2022-12-29

## 2022-11-29 MED ORDER — CALCITRIOL 0.25 MCG PO CAPS: 0.2500 ug | ORAL_CAPSULE | Freq: Every day | ORAL | 2 refills | Status: DC

## 2022-11-29 NOTE — Progress Notes (Signed)
Meds ordered this encounter  Medications   DISCONTD: furosemide (LASIX) 40 MG tablet    Sig: Take 1 tablet (40 mg total) by mouth daily.    Dispense:  90 tablet    Refill:  1    Order Specific Question:   Supervising Provider    Answer:   Quentin Angst [[id number]]   furosemide (LASIX) 40 MG tablet    Sig: Take 1 tablet (40 mg total) by mouth daily.    Dispense:  90 tablet    Refill:  1    Order Specific Question:   Supervising Provider    Answer:   Quentin Angst [[id number]]   calcitRIOL (ROCALTROL) 0.25 MCG capsule    Sig: Take 1 capsule (0.25 mcg total) by mouth daily.    Dispense:  90 capsule    Refill:  2    Order Specific Question:   Supervising Provider    Answer:   Quentin Angst [[id number]]   Nolon Nations  APRN, MSN, FNP-C Patient Care [facility] [address] [place], [place] [address] [phone]

## 2022-11-30 ENCOUNTER — Ambulatory Visit (HOSPITAL_COMMUNITY)
Admission: RE | Admit: 2022-11-30 | Discharge: 2022-11-30 | Disposition: A | Discharge status: Home / Self Care | Payer: Medicare HMO | Source: Ambulatory Visit | Attending: Hematology | Admitting: Hematology

## 2022-11-30 DIAGNOSIS — C3490 Malignant neoplasm of unspecified part of unspecified bronchus or lung: Secondary | ICD-10-CM | POA: Diagnosis not present

## 2022-11-30 DIAGNOSIS — R918 Other nonspecific abnormal finding of lung field: Secondary | ICD-10-CM | POA: Diagnosis not present

## 2022-12-01 ENCOUNTER — Telehealth: Payer: Self-pay | Admitting: Internal Medicine

## 2022-12-03 DIAGNOSIS — M109 Gout, unspecified: Secondary | ICD-10-CM | POA: Diagnosis not present

## 2022-12-03 DIAGNOSIS — N189 Chronic kidney disease, unspecified: Secondary | ICD-10-CM | POA: Diagnosis not present

## 2022-12-03 DIAGNOSIS — N179 Acute kidney failure, unspecified: Secondary | ICD-10-CM | POA: Diagnosis not present

## 2022-12-03 DIAGNOSIS — N1831 Chronic kidney disease, stage 3a: Secondary | ICD-10-CM | POA: Diagnosis not present

## 2022-12-03 DIAGNOSIS — N2581 Secondary hyperparathyroidism of renal origin: Secondary | ICD-10-CM | POA: Diagnosis not present

## 2022-12-03 DIAGNOSIS — D631 Anemia in chronic kidney disease: Secondary | ICD-10-CM | POA: Diagnosis not present

## 2022-12-03 DIAGNOSIS — I5033 Acute on chronic diastolic (congestive) heart failure: Secondary | ICD-10-CM | POA: Diagnosis not present

## 2022-12-03 DIAGNOSIS — D869 Sarcoidosis, unspecified: Secondary | ICD-10-CM | POA: Diagnosis not present

## 2022-12-03 DIAGNOSIS — I130 Hypertensive heart and chronic kidney disease with heart failure and stage 1 through stage 4 chronic kidney disease, or unspecified chronic kidney disease: Secondary | ICD-10-CM | POA: Diagnosis not present

## 2022-12-03 DIAGNOSIS — I639 Cerebral infarction, unspecified: Secondary | ICD-10-CM | POA: Diagnosis not present

## 2022-12-04 LAB — LAB REPORT - SCANNED
Creatinine, POC: 49 mg/dL
EGFR: 41

## 2022-12-04 NOTE — Telephone Encounter (Signed)
Pt has medication now. Kh

## 2022-12-04 NOTE — Telephone Encounter (Signed)
Caller & Relationship to patient:  MRN #  [id number]   Call Back Number:   Date of Last Office Visit: 10/07/2022     Date of Next Office Visit: 12/18/2022    Medication(s) to be Refilled: Fluid pills to be refilled  Preferred Pharmacy:   ** Please notify patient to allow 48-72 hours to process** **Let patient know to contact pharmacy at the end of the day to make sure medication is ready. ** **If patient has not been seen in a year or longer, book an appointment **Advise to use MyChart for refill requests OR to contact their pharmacy

## 2022-12-08 ENCOUNTER — Ambulatory Visit: Payer: Self-pay | Admitting: Family Medicine

## 2022-12-09 ENCOUNTER — Encounter: Payer: Self-pay | Admitting: Nephrology

## 2022-12-11 ENCOUNTER — Ambulatory Visit: Payer: Medicare Other | Admitting: Hematology

## 2022-12-11 ENCOUNTER — Other Ambulatory Visit: Payer: Medicare Other

## 2022-12-17 ENCOUNTER — Other Ambulatory Visit: Payer: Self-pay

## 2022-12-17 DIAGNOSIS — C3490 Malignant neoplasm of unspecified part of unspecified bronchus or lung: Secondary | ICD-10-CM

## 2022-12-18 ENCOUNTER — Inpatient Hospital Stay: Payer: Medicare HMO | Admitting: Hematology

## 2022-12-18 ENCOUNTER — Inpatient Hospital Stay: Payer: Medicare HMO | Attending: Hematology

## 2022-12-18 VITALS — BP 146/60 | HR 72 | Temp 97.9°F | Resp 19 | Ht 66.0 in | Wt 319.2 lb

## 2022-12-18 DIAGNOSIS — N189 Chronic kidney disease, unspecified: Secondary | ICD-10-CM | POA: Diagnosis not present

## 2022-12-18 DIAGNOSIS — C3490 Malignant neoplasm of unspecified part of unspecified bronchus or lung: Secondary | ICD-10-CM

## 2022-12-18 DIAGNOSIS — D869 Sarcoidosis, unspecified: Secondary | ICD-10-CM | POA: Insufficient documentation

## 2022-12-18 DIAGNOSIS — R590 Localized enlarged lymph nodes: Secondary | ICD-10-CM | POA: Insufficient documentation

## 2022-12-18 DIAGNOSIS — Z8049 Family history of malignant neoplasm of other genital organs: Secondary | ICD-10-CM | POA: Diagnosis not present

## 2022-12-18 DIAGNOSIS — Z807 Family history of other malignant neoplasms of lymphoid, hematopoietic and related tissues: Secondary | ICD-10-CM | POA: Diagnosis not present

## 2022-12-18 DIAGNOSIS — Z85118 Personal history of other malignant neoplasm of bronchus and lung: Secondary | ICD-10-CM | POA: Insufficient documentation

## 2022-12-18 DIAGNOSIS — D573 Sickle-cell trait: Secondary | ICD-10-CM | POA: Insufficient documentation

## 2022-12-18 DIAGNOSIS — Z800 Family history of malignant neoplasm of digestive organs: Secondary | ICD-10-CM | POA: Diagnosis not present

## 2022-12-18 LAB — CBC WITH DIFFERENTIAL (CANCER CENTER ONLY)
Abs Immature Granulocytes: 0.01 10*3/uL (ref 0.00–0.07)
Basophils Absolute: 0.1 10*3/uL (ref 0.0–0.1)
Basophils Relative: 1 %
Eosinophils Absolute: 1.5 10*3/uL — ABNORMAL HIGH (ref 0.0–0.5)
Eosinophils Relative: 27 %
HCT: 34.6 % — ABNORMAL LOW (ref 36.0–46.0)
Hemoglobin: 10.9 g/dL — ABNORMAL LOW (ref 12.0–15.0)
Immature Granulocytes: 0 %
Lymphocytes Relative: 25 %
Lymphs Abs: 1.4 10*3/uL (ref 0.7–4.0)
MCH: 23.1 pg — ABNORMAL LOW (ref 26.0–34.0)
MCHC: 31.5 g/dL (ref 30.0–36.0)
MCV: 73.3 fL — ABNORMAL LOW (ref 80.0–100.0)
Monocytes Absolute: 0.5 10*3/uL (ref 0.1–1.0)
Monocytes Relative: 10 %
Neutro Abs: 2.1 10*3/uL (ref 1.7–7.7)
Neutrophils Relative %: 37 %
Platelet Count: 163 10*3/uL (ref 150–400)
RBC: 4.72 MIL/uL (ref 3.87–5.11)
RDW: 14.3 % (ref 11.5–15.5)
WBC Count: 5.7 10*3/uL (ref 4.0–10.5)
nRBC: 0 % (ref 0.0–0.2)

## 2022-12-18 LAB — CMP (CANCER CENTER ONLY)
ALT: 7 U/L (ref 0–44)
AST: 17 U/L (ref 15–41)
Albumin: 4 g/dL (ref 3.5–5.0)
Alkaline Phosphatase: 142 U/L — ABNORMAL HIGH (ref 38–126)
Anion gap: 6 (ref 5–15)
BUN: 35 mg/dL — ABNORMAL HIGH (ref 8–23)
CO2: 35 mmol/L — ABNORMAL HIGH (ref 22–32)
Calcium: 9.2 mg/dL (ref 8.9–10.3)
Chloride: 102 mmol/L (ref 98–111)
Creatinine: 1.59 mg/dL — ABNORMAL HIGH (ref 0.44–1.00)
GFR, Estimated: 35 mL/min — ABNORMAL LOW (ref >60)
Glucose, Bld: 106 mg/dL — ABNORMAL HIGH (ref 70–99)
Potassium: 4.3 mmol/L (ref 3.5–5.1)
Sodium: 143 mmol/L (ref 135–145)
Total Bilirubin: 0.4 mg/dL (ref 0.3–1.2)
Total Protein: 7.2 g/dL (ref 6.5–8.1)

## 2022-12-18 NOTE — Progress Notes (Signed)
HEMATOLOGY/ONCOLOGY CLINIC NOTE  Date of Service: 12/18/22  Patient Care Team: Massie Maroon, FNP as PCP - General (Family Medicine) Christell Constant, MD as PCP - Cardiology (Cardiology)  CHIEF COMPLAINTS/PURPOSE OF CONSULTATION:  Interval follow-up for surveillance of lung cancer   DIAGNOSIS 1. Right upper lobe T1b, N0, M0 primary lung adenocarcinoma diagnosed in December 2012. She was deemed not to be a surgical candidate. Treated by radiation oncology with SBRT at [facility]. Completed treatment in March 2013 and has been monitored since with no evidence of recurrence based on PET/CT scan done on 05/20/2015.  2.  Right anterior chest wall slowly growing masslike lesion in the right pectoralis minor muscle. Unclear etiology. Biopsy was done on 12/14/2013 at the [place] [facility] [place]. FNA showed skeletal muscles, macrophages and spindle cells some with cytologic atypia. Needle core biopsy showed soft tissue and skeletal muscle with extensive necrosis; rare spindle cells with cytologic atypia. Treatment effect causing reactive atypia cannot be ruled out. Clinical correlation was advised to ensure the sample is representative. PET/CT scan done here on 05/20/2015 shows no change in metabolic activity or size of the medial right chest wall mass suggesting benign etiology.  INTERVAL HISTORY  [name] is a 72 y.o. female here for her 1 year follow-up for continued surveillance of lung cancer. Patient was last seen by me on 12/12/2021 and was doing well overall with no new medical concerns.   Today, she presents in a wheelchair. She reports that she was hospitalized in September 2023 for COPD exacerbation. Patient is unsure of the trigger. She does have nebulizer at home. She was not requiring oxygen during her last visit but has restarted on September 2023.   Patient denies any concern from pulmonologist regarding her sarcoidosis. She denies any bleeding issues or  abnormal bleeding. She continues to follow with pulmonologist regularly.  Patient is continuing to losing weight in preparation for have a breast reduction surgery due to experiencing discomfort. Patient report that she does consume a fairly well-balanced diet.   Patient was seen by a nephrologist last week and does take oral iron. She denies any black stools, blood in stools, or abdominal pain. Her leg swelling has been stable.   MEDICAL HISTORY:  Past Medical History:  Diagnosis Date   Arthritis    Asthma    Cancer (HCC)    lung, adenocarcinoma right lung 2012   CHF (congestive heart failure) (HCC)    Preserved EF   Congenital single kidney    With chronic kidney disease   COPD (chronic obstructive pulmonary disease) (HCC)    Gout    Hypertension    Lung cancer (HCC) 2012   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 Patient notes it was consistent with sarcoidosis but actual pathology results not available.   Oxygen deficiency    Sarcoidosis    Sickle cell trait (HCC)    Sleep apnea    CKD ([place] 11/21/2015 showed no urinary tract abnormalities.) Gout on Uloric - tea as a trigger.  SURGICAL HISTORY: Past Surgical History:  Procedure Laterality Date   LOOP RECORDER INSERTION N/A 09/08/2018   Procedure: LOOP RECORDER INSERTION;  Surgeon: Marinus Maw, MD;  [facility];  Service: Cardiovascular;  Laterality: N/A;   LUNG BIOPSY  TEE WITHOUT CARDIOVERSION N/A 09/08/2018   Procedure: TRANSESOPHAGEAL ECHOCARDIOGRAM (TEE);  Surgeon: Wendall Stade, MD;  Location: [facility] ENDOSCOPY;  Service: Cardiovascular;  Laterality: N/A;  with loop   TUBAL LIGATION     VEIN LIGATION AND STRIPPING      SOCIAL HISTORY: Social History   Socioeconomic History   Marital status: Divorced    Spouse name: Not  on file   Number of children: 2   Years of education: Not on file   Highest education level: Not on file  Occupational History   Not on file  Tobacco Use   Smoking status: Never   Smokeless tobacco: Never  Vaping Use   Vaping Use: Never used  Substance and Sexual Activity   Alcohol use: No    Alcohol/week: 0.0 standard drinks of alcohol   Drug use: No   Sexual activity: Not Currently  Other Topics Concern   Not on file  Social History Narrative   Lives with son.  One son is deceased.     Social Determinants of Health   Financial Resource Strain: Low Risk  (07/15/2022)   Overall Financial Resource Strain (CARDIA)    Difficulty of Paying Living Expenses: Not hard at all  Food Insecurity: No Food Insecurity (08/26/2022)   Hunger Vital Sign    Worried About Running Out of Food in the Last Year: Never true    Ran Out of Food in the Last Year: Never true  Transportation Needs: No Transportation Needs (08/26/2022)   PRAPARE - Administrator, Civil Service (Medical): No    Lack of Transportation (Non-Medical): No  Physical Activity: Inactive (07/15/2022)   Exercise Vital Sign    Days of Exercise per Week: 0 days    Minutes of Exercise per Session: 0 min  Stress: No Stress Concern Present (07/15/2022)   [employer] of Occupational Health - Occupational Stress Questionnaire    Feeling of Stress : Not at all  Social Connections: Moderately Integrated (07/15/2022)   Social Connection and Isolation Panel [NHANES]    Frequency of Communication with Friends and Family: More than three times a week    Frequency of Social Gatherings with Friends and Family: More than three times a week    Attends Religious Services: More than 4 times per year    Active Member of [employer] or Organizations: Yes    Attends Engineer, structural: More than 4 times per year    Marital Status: Divorced  Intimate Partner Violence: Not At Risk (08/17/2022)   Humiliation, Afraid, Rape, and  Kick questionnaire    Fear of Current or Ex-Partner: No    Emotionally Abused: No    Physically Abused: No    Sexually Abused: No    FAMILY HISTORY: Family History  Problem Relation Age of Onset   Hypertension Mother    Renal Disease Mother    High blood pressure Mother    Heart disease Mother    Cancer Father        stomach   Heart disease Father        No details   Cervical cancer Sister    Diabetes Sister    Multiple myeloma Sister    Cancer Brother    Diabetes Brother     ALLERGIES:  is allergic to sulfa antibiotics.  MEDICATIONS:  Current Outpatient Medications  Medication Sig Dispense Refill   acetaminophen (TYLENOL) 500 MG tablet Take 1,000 mg by mouth every 6 (six) hours as needed for mild  pain.     albuterol (PROAIR HFA) 108 (90 Base) MCG/ACT inhaler Inhale 2 puffs into the lungs every 4 (four) hours as needed for wheezing or shortness of breath. 8 g 1   albuterol (PROVENTIL) (2.5 MG/3ML) 0.083% nebulizer solution USE 1 VIAL IN NEBULIZER EVERY 6 HOURS AS NEEDED FOR WHEEZING FOR SHORTNESS OF BREATH (Patient taking differently: Take 2.5 mg by nebulization every 6 (six) hours as needed for shortness of breath.) 300 mL 2   aspirin EC 81 MG tablet Take 1 tablet (81 mg total) by mouth daily. Swallow whole. 30 tablet 11   budesonide (PULMICORT) 0.25 MG/2ML nebulizer solution One twice daily with albuterol 120 mL 12   calcitRIOL (ROCALTROL) 0.25 MCG capsule Take 1 capsule (0.25 mcg total) by mouth daily. 90 capsule 2   cetirizine (ZYRTEC) 10 MG chewable tablet Chew 10 mg by mouth daily.     docusate sodium (STOOL SOFTENER) 100 MG capsule Take 100 mg by mouth daily.     famotidine (PEPCID) 20 MG tablet One after supper (Patient taking differently: Take 20 mg by mouth every evening. One after supper.  Takes PRN.) 30 tablet 11   febuxostat (ULORIC) 40 MG tablet Take 1 tablet (40 mg total) by mouth daily. 90 tablet 1   ferrous sulfate 325 (65 FE) MG tablet Take 1 tablet (325 mg  total) by mouth daily with lunch. 30 tablet 0   furosemide (LASIX) 40 MG tablet Take 1 tablet (40 mg total) by mouth daily. 90 tablet 1   Incontinence Supply Disposable (DEPEND UNDERWEAR LARGE/XL) MISC 1 each by Does not apply route 2 times daily at 12 noon and 4 pm. 17 each 12   montelukast (SINGULAIR) 10 MG tablet Take 1 tablet (10 mg total) by mouth at bedtime. 90 tablet 3   pantoprazole (PROTONIX) 40 MG tablet TAKE 1 TABLET EVERY DAY 30 TO 60 MINUTES BEFORE FIRST MEAL OF THE DAY 90 tablet 3   No current facility-administered medications for this visit.    REVIEW OF SYSTEMS:    10 Point review of Systems was done is negative except as noted above.   PHYSICAL EXAMINATION:  ECOG PERFORMANCE STATUS: 2-3  . Vitals:   12/18/22 1032  BP: (!) 146/60  Pulse: 72  Resp: 19  Temp: 97.9 F (36.6 C)  SpO2: 100%   Filed Weights   12/18/22 1032  Weight: (!) 319 lb 3 oz (144.8 kg)   .Body mass index is 51.52 kg/m.   Wt Readings from Last 3 Encounters:  10/27/22 (!) 336 lb 9.6 oz (152.7 kg)  10/08/22 (!) 327 lb 6.4 oz (148.5 kg)  09/24/22 (!) 329 lb (149.2 kg)    GENERAL:alert, in no acute distress and comfortable SKIN: no acute rashes, no significant lesions EYES: conjunctiva are pink and non-injected, sclera anicteric OROPHARYNX: MMM, no exudates, no oropharyngeal erythema or ulceration NECK: supple, no JVD LYMPH:  no palpable lymphadenopathy in the cervical, axillary or inguinal regions LUNGS: clear to auscultation b/l with normal respiratory effort HEART: regular rate & rhythm ABDOMEN:  normoactive bowel sounds , non tender, not distended. Extremity: no pedal edema PSYCH: alert & oriented x 3 with fluent speech NEURO: no focal motor/sensory deficits   LABORATORY DATA:  I have reviewed the data as listed     Latest Ref Rng & Units 12/18/2022    9:20 AM 08/27/2022   11:00 AM 08/25/2022    4:49 AM  CBC  WBC 4.0 - 10.5 K/uL 5.7  7.8  5.5  Hemoglobin 12.0 - 15.0 g/dL 16.1   09.6  04.5   Hematocrit 36.0 - 46.0 % 34.6  41.4  38.8   Platelets 150 - 400 K/uL 163  176.0  160    . CBC    Component Value Date/Time   WBC 5.7 12/18/2022 0920   WBC 7.8 08/27/2022 1100   RBC 4.72 12/18/2022 0920   HGB 10.9 (L) 12/18/2022 0920   HGB 11.0 (L) 08/29/2019 0941   HGB 11.8 05/31/2017 1050   HCT 34.6 (L) 12/18/2022 0920   HCT 37.1 01/29/2022 0940   HCT 37.5 05/31/2017 1050   PLT 163 12/18/2022 0920   PLT 195 08/29/2019 0941   MCV 73.3 (L) 12/18/2022 0920   MCV 72 (L) 08/29/2019 0941   MCV 71.2 (L) 05/31/2017 1050   MCH 23.1 (L) 12/18/2022 0920   MCHC 31.5 12/18/2022 0920   RDW 14.3 12/18/2022 0920   RDW 16.2 (H) 08/29/2019 0941   RDW 15.1 (H) 05/31/2017 1050   LYMPHSABS 1.4 12/18/2022 0920   LYMPHSABS 1.0 08/29/2019 0941   LYMPHSABS 1.2 05/31/2017 1050   MONOABS 0.5 12/18/2022 0920   MONOABS 0.4 05/31/2017 1050   EOSABS 1.5 (H) 12/18/2022 0920   EOSABS 1.0 (H) 08/29/2019 0941   BASOSABS 0.1 12/18/2022 0920   BASOSABS 0.0 08/29/2019 0941   BASOSABS 0.0 05/31/2017 1050        Latest Ref Rng & Units 12/18/2022    9:20 AM 10/27/2022   11:03 AM 09/24/2022    8:57 AM  CMP  Glucose 70 - 99 mg/dL 409  87  85   BUN 8 - 23 mg/dL 35  21  16   Creatinine 0.44 - 1.00 mg/dL 8.11  9.14  7.82   Sodium 135 - 145 mmol/L 143  143  141   Potassium 3.5 - 5.1 mmol/L 4.3  4.1  4.1   Chloride 98 - 111 mmol/L 102  104  100   CO2 22 - 32 mmol/L 35  26  26   Calcium 8.9 - 10.3 mg/dL 9.2  8.6  8.1   Total Protein 6.5 - 8.1 g/dL 7.2  6.1  5.7   Total Bilirubin 0.3 - 1.2 mg/dL 0.4  0.5  <9.5   Alkaline Phos 38 - 126 U/L 142  131  173   AST 15 - 41 U/L 17  19  18    ALT 0 - 44 U/L 7  10  12      RADIOGRAPHIC STUDIES: I have personally reviewed the radiological images as listed and agreed with the findings in the report. CT Chest Wo Contrast  Result Date: 12/03/2022 CLINICAL DATA:  Non-small cell lung cancer, status post radiation EXAM: CT CHEST WITHOUT CONTRAST TECHNIQUE:  Multidetector CT imaging of the chest was performed following the standard protocol without IV contrast. RADIATION DOSE REDUCTION: This exam was performed according to the departmental dose-optimization program which includes automated exposure control, adjustment of the mA and/or kV according to patient size and/or use of iterative reconstruction technique. COMPARISON:  CTA chest dated 04/04/2022 FINDINGS: Cardiovascular: Heart is top-normal in size. No pericardial effusion. No evidence of thoracic aortic aneurysm. Mediastinum/Nodes: No suspicious mediastinal lymphadenopathy. Visualized thyroid is grossly unremarkable. Lungs/Pleura: Platelike scarring/radiation changes in the posterior right upper lobe. Faint ground-glass opacity in the anterior left upper lobe. Motion degraded images. However, within that constraint, there is a stable 3 mm subpleural nodule in the posterior right upper lobe along the major fissure (series 6/image 50), benign. No new/suspicious pulmonary  nodules. No pleural effusion or pneumothorax. Upper Abdomen: Visualized upper abdomen is grossly unremarkable. Musculoskeletal: Stable masslike appearance of the right pectoralis musculature (series 2/image 59), likely postprocedural or posttraumatic. Degenerative changes of the visualized thoracolumbar spine. No focal osseous lesions. IMPRESSION: Platelike scarring/radiation changes in the posterior right upper lobe. No evidence of recurrent or metastatic disease. Electronically Signed   By: Charline Bills M.D.   On: 12/03/2022 00:20      ASSESSMENT & PLAN:   72 y.o. African-American female with   #1 History of right upper lobe T1b, N0, M0 primary lung adenocarcinoma most in December 2012. She was deemed not to be a surgical candidate. Treated by radiation oncology with SBRT at [facility]. Completed treatment in March 2013 and has been monitored since with no evidence of recurrence. no evidence of recurrence based on PET/CT  scan done on 05/20/2015. On clinical visit today the patient has no new change in her breathing and no new focal symptoms.  Hemoglobin is stable.   CT chest 05/26/2016 shows no evidence of local recurrence of cancer or metastatic disease. CT Chest, 05/31/2017 shows no evidence of local recurrence of cancer or metastatic disease.   #2 Right anterior chest wall slowly growing masslike lesion in the right pectoralis minor muscle. Unclear etiology. FNA showed skeletal muscles, macrophages and spindle cells some with cytologic atypia. Needle core biopsy showed soft tissue and skeletal muscle with extensive necrosis; rare spindle cells with cytologic atypia. Treatment effect causing reactive atypia cannot be ruled out.  PET/CT scan done here on 05/20/2015 shows no change in metabolic activity or size of the medial right chest wall mass suggesting benign etiology. Could certainly be related to her sarcoidosis . Previously discussed with the patient the option of biopsying the mass for a more definitive diagnosis versus monitoring it. She chooses to monitor it at this time given that imaging findings suggest a likely benign etiology that hasnt changed over the last 18 months. CT chest 05/26/2016 shows that this lesion is stable compared to previous imaging. -CT Chest, 05/31/2017 shows that this lesion is stable. No locally recurrent or metastatic disease.   PLAN:  -Discussed lab results on 12/18/2022 in detail with patient. CBC showed WBC of 5.7K, hemoglobin of 10.9, and platelets of 163K. -slight anemia -patient does endorse sickle cell trait -CMP shows chronic kidney disease -CT chest scan on 5/13 showed scarring from radiation change in right upper lung, no other sign of recurrent lung cancer -will order CT chest in 11 months -patient shall continue to follow with pulmonology to optimize lung functions and work towards avoiding hospitalizations -continue to follow with nephrology regularly to optimize  kidney function -Continue follow-up with pulmonology for treatment of sarcoidosis. -No indication clinically or radiographically for progression/recurrence of lung cancer. -Continue f/u with PCP, nephrologist, cardiologist, pulmonologist. -Will see back in 12 months with labs.    #3 history of extensive sarcoidosis involving lymphadenopathy in the chest, abdomen as well as spleen and possibly liver .previously treated with steroids by Dr. Evelena Peat, pulmonary at [facility] .has recently established care with Dr. Sherene Sires for her pulmonary and sarcoidosis management.   Plan -Continue followup with Dr. Sherene Sires For further management   #4 history of sickle cell trait. Hemoglobin electrophoresis is not available. Hgb at 11.4 today.  Microcytosis likely from chronic disease from her sarcoidosis vs possible alpha thal trait. Ferritin levels WNL  Plan   -No indication for additional iron replacement at this time.  FOLLOW-UP: RTC with  Dr Candise Che with labs in 12 months CT chest in 11 months  The total time spent in the appointment was 21 minutes* .  All of the patient's questions were answered with apparent satisfaction. The patient knows to call the clinic with any problems, questions or concerns.   Wyvonnia Lora MD MS AAHIVMS [facility] Hematology/Oncology Physician [facility]  .*Total Encounter Time as defined by the Centers for Medicare and Medicaid Services includes, in addition to the face-to-face time of a patient visit (documented in the note above) non-face-to-face time: obtaining and reviewing outside history, ordering and reviewing medications, tests or procedures, care coordination (communications with other health care professionals or caregivers) and documentation in the medical record.    I,Mitra Faeizi,acting as a Neurosurgeon for Wyvonnia Lora, MD.,have documented all relevant documentation on the behalf of Wyvonnia Lora, MD,as directed by  Wyvonnia Lora, MD while in the  presence of Wyvonnia Lora, MD.  .I have reviewed the above documentation for accuracy and completeness, and I agree with the above. Johney Maine MD

## 2022-12-21 ENCOUNTER — Telehealth: Payer: Self-pay | Admitting: Podiatry

## 2022-12-21 ENCOUNTER — Ambulatory Visit: Payer: PRIVATE HEALTH INSURANCE | Admitting: Podiatry

## 2022-12-21 NOTE — Telephone Encounter (Signed)
Pt left message Friday 5.31 at 239pm canceling appt for 6.3 at 1045.   Appt has been cxled.

## 2022-12-22 ENCOUNTER — Telehealth: Payer: Self-pay | Admitting: Hematology

## 2022-12-29 ENCOUNTER — Other Ambulatory Visit: Payer: Self-pay

## 2022-12-29 DIAGNOSIS — I5030 Unspecified diastolic (congestive) heart failure: Secondary | ICD-10-CM

## 2022-12-29 MED ORDER — CALCITRIOL 0.25 MCG PO CAPS: 0.2500 ug | ORAL_CAPSULE | Freq: Every day | ORAL | 2 refills | Status: DC

## 2022-12-29 MED ORDER — FUROSEMIDE 40 MG PO TABS
40.0000 mg | ORAL_TABLET | Freq: Every day | ORAL | 1 refills | Status: DC
Start: 2022-12-29 — End: 2023-07-06

## 2023-01-01 ENCOUNTER — Encounter: Payer: Self-pay | Admitting: Internal Medicine

## 2023-01-01 ENCOUNTER — Ambulatory Visit: Payer: Medicare HMO | Admitting: Internal Medicine

## 2023-01-01 VITALS — BP 100/70 | HR 81 | Temp 98.2°F | Ht 66.0 in | Wt 316.0 lb

## 2023-01-01 DIAGNOSIS — J4530 Mild persistent asthma, uncomplicated: Secondary | ICD-10-CM | POA: Diagnosis not present

## 2023-01-01 DIAGNOSIS — J9611 Chronic respiratory failure with hypoxia: Secondary | ICD-10-CM | POA: Diagnosis not present

## 2023-01-01 DIAGNOSIS — J398 Other specified diseases of upper respiratory tract: Secondary | ICD-10-CM

## 2023-01-01 DIAGNOSIS — J4541 Moderate persistent asthma with (acute) exacerbation: Secondary | ICD-10-CM

## 2023-01-01 DIAGNOSIS — D869 Sarcoidosis, unspecified: Secondary | ICD-10-CM

## 2023-01-01 MED ORDER — ALBUTEROL SULFATE (2.5 MG/3ML) 0.083% IN NEBU: INHALATION_SOLUTION | RESPIRATORY_TRACT | 5 refills | Status: DC

## 2023-01-01 MED ORDER — ALBUTEROL SULFATE (2.5 MG/3ML) 0.083% IN NEBU
2.5000 mg | INHALATION_SOLUTION | Freq: Once | RESPIRATORY_TRACT | Status: AC
Start: 2023-01-01 — End: 2023-01-01
  Administered 2023-01-01: 2.5 mg via RESPIRATORY_TRACT

## 2023-01-01 MED ORDER — ALBUTEROL SULFATE HFA 108 (90 BASE) MCG/ACT IN AERS
2.0000 | INHALATION_SPRAY | RESPIRATORY_TRACT | 5 refills | Status: DC | PRN
Start: 2023-01-01 — End: 2023-05-04

## 2023-01-01 NOTE — Progress Notes (Signed)
[place]    [id number]    1951-07-12  Primary Care Physician:Hollis, Rosalene Billings, FNP Date of Appointment: 01/01/2023 Established Patient Visit  Chief complaint:   Chief Complaint  Patient presents with   Acute Visit    Has been coughing up clear mucous with some yellow in it for almost 2 weeks.  Wheezing as well.  Denies any fever, chills or body aches.     HPI: Pamela [name] is a 72 y.o. woman with asthma, sarcoidosis, on home oxygen 2L and T1b N0M0 adenocarcinoma of the lung s/p SBRT in 2013. Follows with Dr. Sherene Sires.   Interval Updates: Here for acute visit. Two weeks worsening wheezing - was noted at two separate doctors offices. One week she's been out of albuterol. Also out of albuterol nebulizer. She has not been taking budesonide because she thought she was supposed to taking them together only.   No fevers, chills. She denies worsening dyspnea. No sick contacts  Records reivewed - history of elevated feno but was most recently 8 ppb in Feb 2024.   I have reviewed the patient's family social and past medical history and updated as appropriate.   Past Medical History:  Diagnosis Date   Arthritis    Asthma    Cancer (HCC)    lung, adenocarcinoma right lung 2012   CHF (congestive heart failure) (HCC)    Preserved EF   Congenital single kidney    With chronic kidney disease   COPD (chronic obstructive pulmonary disease) (HCC)    Gout    Hypertension    Lung cancer (HCC) 2012   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 Patient notes it was consistent with sarcoidosis but actual pathology results not available.   Oxygen deficiency    Sarcoidosis    Sickle cell trait (HCC)    Sleep apnea     Past Surgical History:  Procedure Laterality Date   LOOP RECORDER INSERTION N/A  09/08/2018   Procedure: LOOP RECORDER INSERTION;  Surgeon: Marinus Maw, MD;  [facility]: [facility];  Service: Cardiovascular;  Laterality: N/A;   LUNG BIOPSY     TEE WITHOUT CARDIOVERSION N/A 09/08/2018   Procedure: TRANSESOPHAGEAL ECHOCARDIOGRAM (TEE);  Surgeon: Wendall Stade, MD;  Location: [facility] ENDOSCOPY;  Service: Cardiovascular;  Laterality: N/A;  with loop   TUBAL LIGATION     VEIN LIGATION AND STRIPPING      Family History  Problem Relation Age of Onset   Hypertension Mother    Renal Disease Mother    High blood pressure Mother    Heart disease Mother    Cancer Father        stomach   Heart disease Father        No details   Cervical cancer Sister    Diabetes Sister    Multiple myeloma Sister    Cancer Brother    Diabetes Brother     Social History   Occupational History   Not on file  Tobacco Use   Smoking status: Never   Smokeless tobacco: Never  Vaping Use   Vaping Use: Never used  Substance and Sexual Activity   Alcohol use: No    Alcohol/week: 0.0 standard drinks of alcohol   Drug use: No   Sexual  activity: Not Currently     Physical Exam: Blood pressure 100/70, pulse 81, temperature 98.2 F (36.8 C), temperature source Oral, height 5\' 6"  (1.676 m), weight (!) 316 lb (143.3 kg), SpO2 95 %.  Gen:      No acute distress, obese ENT:  wheezing best auscultated anterior neck, no nasal polyps, mucus membranes moist Lungs:    No increased respiratory effort, symmetric chest wall excursion, clear to auscultation bilaterally,transmitted wheezing noted CV:         Regular rate and rhythm; no murmurs, rubs, or gallops.  No pedal edema   Data Reviewed: Imaging: I have personally reviewed the CTPE study Sept 2023 - shows severe narrowing of the trachea. No PE.   PFTs:     Latest Ref Rng & Units 05/22/2022    8:13 AM 08/30/2018   10:03 AM 07/19/2014   11:01 AM  PFT Results  FVC-Pre L 1.41  1.92  2.32   FVC-Predicted Pre % 42  69  85   FVC-Post L  1.51  2.13  2.36   FVC-Predicted Post % 46  77  86   Pre FEV1/FVC % % 84  83  71   Post FEV1/FCV % % 85  79  85   FEV1-Pre L 1.18  1.59  1.64   FEV1-Predicted Pre % 47  74  76   FEV1-Post L 1.29  1.69  2.00   DLCO uncorrected ml/min/mmHg 14.60  23.90  18.43   DLCO UNC% % 69  112  71   DLCO corrected ml/min/mmHg 14.60     DLCO COR %Predicted % 69     DLVA Predicted % 111  154  101   TLC L 3.59     TLC % Predicted % 67     RV % Predicted % 66      I have personally reviewed the patient's PFTs and no airflow limitation.   Labs: Absolute eosinophil count 1500 Immunization status: Immunization History  Administered Date(s) Administered   Influenza,inj,Quad PF,6+ Mos 04/13/2014, 04/16/2015, 04/03/2016, 05/19/2017, 06/15/2018, 05/14/2019, 04/21/2022   PFIZER(Purple Top)SARS-COV-2 Vaccination 10/14/2019, 11/08/2019, 07/08/2020, 03/06/2021   Pneumococcal Conjugate-13 04/03/2016   Pneumococcal Polysaccharide-23 04/13/2014, 12/26/2019   Tdap 01/02/2015   Zoster, Live 05/21/2014    External Records Personally Reviewed: pulmonary  Assessment:  Severe persistent asthma with acute exacerbation Peripheral eosinophilia AEC 1500 in May 2024.  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  Will give her a neb in office. She is wheezing but without signs or symptoms or systemic infection. Prednisone would not be useful here as her wheezing is almost certainly from the tracheomalacia.   Would talk to her about dupixent given her frequent exacerbations. Would consider adding brovana nebulizer treatment to her budesonide.    Return to Care: She already has an appointment with Dr. Sherene Sires next month.    Durel Salts, MD Pulmonary and Critical Care Medicine [facility] Office:[phone]

## 2023-01-01 NOTE — Patient Instructions (Signed)
Follow up with Dr. Sherene Sires next month as scheduled.  I have refilled the albuterol to [facility].   Continue budesonide nebs twice daily. Use albuterol up to 4 times a day as needed.

## 2023-01-06 ENCOUNTER — Ambulatory Visit
Admission: RE | Admit: 2023-01-06 | Discharge: 2023-01-06 | Disposition: A | Discharge status: Home / Self Care | Payer: Medicare HMO | Source: Ambulatory Visit | Attending: Family Medicine | Admitting: Family Medicine

## 2023-01-06 DIAGNOSIS — N959 Unspecified menopausal and perimenopausal disorder: Secondary | ICD-10-CM | POA: Diagnosis not present

## 2023-01-06 DIAGNOSIS — N951 Menopausal and female climacteric states: Secondary | ICD-10-CM

## 2023-01-06 DIAGNOSIS — Z780 Asymptomatic menopausal state: Secondary | ICD-10-CM

## 2023-01-06 DIAGNOSIS — E349 Endocrine disorder, unspecified: Secondary | ICD-10-CM | POA: Diagnosis not present

## 2023-01-11 ENCOUNTER — Ambulatory Visit: Payer: Medicare HMO | Admitting: Internal Medicine

## 2023-01-11 NOTE — Progress Notes (Unsigned)
Cardiology Office Note:  .   Date:  01/12/2023  ID:  Pamela [name], DOB 15-May-1951, MRN [id number] PCP: Massie Maroon, FNP  [facility] Providers Cardiologist:  Christell Constant, MD    History of Present Illness: .   Pamela [name] is a very pleasant 72 y.o. female with PMH PFO seen on 2020 bubble study, prior stroke, sarcoidosis, OSA, HTN, morbid obesity, lung adenocarcinoma 2012, COPD, singular kidney with subsequent CKD, HFpEF, LINQ monitor implant, and aortic atherosclerosis      Last cardiology clinic visit was 09/24/2020 with Dr. Raynelle Jan at which time she was felt to be NYHA class A stage B hypervolemic with discussion of possibly increasing Lasix. She wanted to monitor for further shortness of breath or weight gain. Recommendation to follow-up in 6 months.     ROS: Doing well. Ambulates with rolling walker for appointments, uses a cane at home. On chronic O2 at 3L per [place]. No specific cardiac symptoms. Did not realize she had not been seen in 2 years. Recently seen by pulmonology for wheezing. She has chronic shortness of breath and DOE that she feels is stable. Significant bilateral LE edema that she feels is stable. She denies chest pain, fatigue, palpitations, melena, hematuria, hemoptysis, diaphoresis, weakness, presyncope, syncope, and PND. Continues to sleep on 4 pillows. No change in breathing or activity intolerance that is concerning her. Continues to live alone and do all of her own house work and shopping. She reports 40 lb intentional weight loss. Goal is to be > 300 lb. Lift chair is broken, elevates legs in bed.   Studies Reviewed: .         Risk Assessment/Calculations:             Physical Exam:   VS:  BP 120/66   Pulse 60   Ht 5\' 4"  (1.626 m)   Wt (!) 309 lb 6.4 oz (140.3 kg)   SpO2 97%   BMI 53.11 kg/m    Wt Readings from Last 3 Encounters:  01/12/23 (!) 309 lb 6.4 oz (140.3 kg)  01/01/23 (!) 316 lb (143.3 kg)  12/18/22 (!) 319 lb 3  oz (144.8 kg)    GEN: Obese, well developed in no acute distress NECK: No JVD; No carotid bruits CARDIAC: RRR, no murmurs, rubs, gallops RESPIRATORY:  + wheezing bilaterally. No rales or rhonchi  ABDOMEN: Soft, non-tender, non-distended EXTREMITIES:  No edema; No deformity   ASSESSMENT AND PLAN: .    Chronic HFpEF NYHA Class II/III: Echo 08/17/22 with LVEF 60-65%, normal diastolic parameters, no rwma, normal RV size and function. Chronic shortness of breath and dyspnea on exertion. Chronic LE edema that is significant but unchanged per patient. 4 pillow orthopnea for many years, no recent change. No PND. Body habitus makes it difficult to assess for volume overload. Weight is stable.  Encouraged heart healthy, mostly plant-based, low-sodium diet. Scr 1.59 on lab work completed 12/18/22. No medication changes today. Continue Lasix.  Hypertension: BP is well controlled. Not currently on any anti-hypertensives.   Hyperlipidemia LDL goal < 70/Aortic atherosclerosis: LDL 112 on 10/27/2022. Appears she was on atorvastatin in the past, is unsure why it was stopped. No side effects. With hx of stroke and aortic atherosclerosis, need to get LDL < 70. Will have her resume atorvastatin 40 mg and return in 2-3 months for fasting lipid panel and CMET.   History of stroke: History of stroke 06/2020. LINQ reached RRT 06/29/22, pt elected to leave device and was to  call back for explant. No evidence of atrial fib on monitor. No recent signs of stroke per patient.   Chronic respiratory failure: On chronic oxygen at 3 L [place].  No acute concerns today.  Management per pulmonology.       Dispo: 1 year with Dr. Izora Ribas  Signed, Lyris Hitchman, Zachary George, NP

## 2023-01-12 ENCOUNTER — Ambulatory Visit: Payer: Medicare HMO | Attending: Nurse Practitioner | Admitting: Nurse Practitioner

## 2023-01-12 ENCOUNTER — Encounter: Payer: Self-pay | Admitting: Nurse Practitioner

## 2023-01-12 VITALS — BP 120/66 | HR 60 | Ht 64.0 in | Wt 309.4 lb

## 2023-01-12 DIAGNOSIS — E785 Hyperlipidemia, unspecified: Secondary | ICD-10-CM | POA: Diagnosis not present

## 2023-01-12 DIAGNOSIS — J9611 Chronic respiratory failure with hypoxia: Secondary | ICD-10-CM

## 2023-01-12 DIAGNOSIS — J9612 Chronic respiratory failure with hypercapnia: Secondary | ICD-10-CM

## 2023-01-12 DIAGNOSIS — Z8673 Personal history of transient ischemic attack (TIA), and cerebral infarction without residual deficits: Secondary | ICD-10-CM | POA: Diagnosis not present

## 2023-01-12 DIAGNOSIS — I5032 Chronic diastolic (congestive) heart failure: Secondary | ICD-10-CM

## 2023-01-12 DIAGNOSIS — I700 Atherosclerosis of aorta: Secondary | ICD-10-CM

## 2023-01-12 DIAGNOSIS — I10 Essential (primary) hypertension: Secondary | ICD-10-CM | POA: Diagnosis not present

## 2023-01-12 MED ORDER — ATORVASTATIN CALCIUM 40 MG PO TABS: 40.0000 mg | ORAL_TABLET | Freq: Every day | ORAL | 3 refills | Status: DC

## 2023-01-12 NOTE — Patient Instructions (Signed)
Medication Instructions:  RESUME Atorvastatin 40mg  one (1) tablet by mouth everyday.  *If you need a refill on your cardiac medications before your next appointment, please call your pharmacy*   Lab Work: Your physician recommends that you return for a CMET and a FASTING lipid profile: Wednesday, Sept 4, 2024. Fasting after midnight. You may come anytime between 7:30am and 4:30 pm.   If you have labs (blood work) drawn today and your tests are completely normal, you will receive your results only by: MyChart Message (if you have MyChart) OR A paper copy in the mail If you have any lab test that is abnormal or we need to change your treatment, we will call you to review the results.   Testing/Procedures: None   Follow-Up: At [facility], you and your health needs are our priority.  As part of our continuing mission to provide you with exceptional heart care, we have created designated Provider Care Teams.  These Care Teams include your primary Cardiologist (physician) and Advanced Practice Providers (APPs -  Physician Assistants and Nurse Practitioners) who all work together to provide you with the care you need, when you need it.  We recommend signing up for the patient portal called "MyChart".  Sign up information is provided on this After Visit Summary.  MyChart is used to connect with patients for Virtual Visits (Telemedicine).  Patients are able to view lab/test results, encounter notes, upcoming appointments, etc.  Non-urgent messages can be sent to your provider as well.   To learn more about what you can do with MyChart, go to [email or web address].    Your next appointment:   1 year(s)  Provider:   Christell Constant, MD

## 2023-01-25 ENCOUNTER — Ambulatory Visit: Payer: Medicare HMO | Admitting: Internal Medicine

## 2023-01-26 ENCOUNTER — Ambulatory Visit (INDEPENDENT_AMBULATORY_CARE_PROVIDER_SITE_OTHER): Payer: Medicare HMO | Admitting: Family Medicine

## 2023-01-26 VITALS — BP 138/61 | HR 63 | Temp 97.2°F | Ht 64.0 in | Wt 320.6 lb

## 2023-01-26 DIAGNOSIS — Z6841 Body Mass Index (BMI) 40.0 and over, adult: Secondary | ICD-10-CM | POA: Diagnosis not present

## 2023-01-26 DIAGNOSIS — I10 Essential (primary) hypertension: Secondary | ICD-10-CM

## 2023-01-26 DIAGNOSIS — R600 Localized edema: Secondary | ICD-10-CM

## 2023-01-26 DIAGNOSIS — N183 Chronic kidney disease, stage 3 unspecified: Secondary | ICD-10-CM | POA: Diagnosis not present

## 2023-01-26 NOTE — Progress Notes (Signed)
Established Patient Office Visit  Subjective   Patient ID: Pamela [name], female    DOB: 04/22/1951  Age: 72 y.o. MRN: [id number]  Chief Complaint  Patient presents with   Follow-up    Pamela [name] is a 72 year old female with a medical history significant for essential hypertension, obesity, stage III chronic kidney disease, moderate persistent asthma, COPD, history of lung cancer, and sarcoidosis presents for follow-up of her chronic conditions.  [name] states that she has been doing well and is without complaint on today.  Patient says that she has been wearing her oxygen over the past several weeks.  She feels that shortness of breath occurs with exertion.  Also, patient noticed occasional wheezing.  She has been taking all medications consistently.  Patient is followed by pulmonology and has an upcoming appointment. Patient has very well-controlled blood pressure.  She does not check her blood pressures at home.  Patient endorses lower extremity edema, which is chronic.  She denies any headache, blurry vision, chest pain.  Some occasional shortness of breath secondary to COPD and asthma.  Patient has been following up with her specialist as scheduled.    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   Severe persistent chronic asthma without complication 09/08/2022   Sickle-cell trait (HCC) 09/08/2022   Acute respiratory failure with hypercapnia (HCC) 08/17/2022   COPD exacerbation (HCC) 04/04/2022   Vitamin D deficiency 02/02/2022   Stage 3b chronic kidney disease (HCC) 09/24/2020   Malignant neoplasm of lung (HCC) 09/24/2020   Aortic atherosclerosis (HCC) 09/24/2020   PFO (patent foramen ovale) 09/24/2020   History of stroke 09/24/2020   Acute respiratory failure with hypoxia (HCC) 12/17/2019   COPD with acute exacerbation (HCC) 12/17/2019   COVID-19 virus infection 01/02/2019   Right knee pain 09/15/2018   Closed head  injury with concussion 09/06/2018   Hypokalemia 09/06/2018   Hypomagnesemia 09/06/2018   Asthma, chronic obstructive, with acute exacerbation (HCC) 08/02/2018   Plantar fasciitis 04/14/2017   Microcytosis 11/28/2015   Solitary kidney 07/18/2015   Onychomycosis 01/09/2015   Ingrown nail 01/09/2015   Pain in lower limb 01/09/2015   Chronic respiratory failure with hypoxia and hypercapnia (HCC) 07/19/2014   Acute on chronic respiratory failure with hypoxia and hypercapnia (HCC) 06/24/2014   Anemia, iron deficiency 05/27/2014   Acute gouty arthritis 05/25/2014   Obstructive sleep apnea 05/25/2014   Joint pain 05/24/2014   Chronic diastolic CHF (congestive heart failure) (HCC)    Sarcoidosis    Metabolic syndrome 05/21/2014   Asthma, chronic 04/27/2014   Anemia of chronic disease 04/19/2014   Morbid obesity (HCC) 04/18/2014   Immunization due 04/18/2014   Need for prophylactic vaccination and inoculation against influenza 04/18/2014   Prediabetes 04/13/2014   Essential hypertension 04/13/2014   Lower extremity edema 04/13/2014   History of lung cancer 04/13/2014   Past Medical History:  Diagnosis Date   Arthritis    Asthma    Cancer (HCC)    lung, adenocarcinoma right lung 2012   CHF (congestive heart failure) (HCC)    Preserved EF   Congenital single kidney    With chronic kidney disease   COPD (chronic obstructive pulmonary disease) (HCC)    Gout    Hypertension    Lung cancer (HCC) 2012   Right upper lobe lung adenocarcinoma diagnosed with needle biopsy treated by SBRT finished treatment April 2013 has been monitored since   Mass of chest wall, right  Right chest wall mass 7.3 cm biopsy on 12/13/2013. Patient notes it was consistent with sarcoidosis but actual pathology results not available.   Oxygen deficiency    Sarcoidosis    Sickle cell trait (HCC)    Sleep apnea    Past Surgical History:  Procedure Laterality Date   LOOP RECORDER INSERTION N/A 09/08/2018    Procedure: LOOP RECORDER INSERTION;  Surgeon: Marinus Maw, MD;  Location: [facility] INVASIVE CV LAB;  Service: Cardiovascular;  Laterality: N/A;   LUNG BIOPSY     TEE WITHOUT CARDIOVERSION N/A 09/08/2018   Procedure: TRANSESOPHAGEAL ECHOCARDIOGRAM (TEE);  Surgeon: Wendall Stade, MD;  Location: [facility] ENDOSCOPY;  Service: Cardiovascular;  Laterality: N/A;  with loop   TUBAL LIGATION     VEIN LIGATION AND STRIPPING     Social History   Tobacco Use   Smoking status: Never   Smokeless tobacco: Never  Vaping Use   Vaping Use: Never used  Substance Use Topics   Alcohol use: No    Alcohol/week: 0.0 standard drinks of alcohol   Drug use: No   Social History   Socioeconomic History   Marital status: Divorced    Spouse name: Not on file   Number of children: 2   Years of education: Not on file   Highest education level: Not on file  Occupational History   Not on file  Tobacco Use   Smoking status: Never   Smokeless tobacco: Never  Vaping Use   Vaping Use: Never used  Substance and Sexual Activity   Alcohol use: No    Alcohol/week: 0.0 standard drinks of alcohol   Drug use: No   Sexual activity: Not Currently  Other Topics Concern   Not on file  Social History Narrative   Lives with son.  One son is deceased.     Social Determinants of Health   Financial Resource Strain: Low Risk  (07/15/2022)   Overall Financial Resource Strain (CARDIA)    Difficulty of Paying Living Expenses: Not hard at all  Food Insecurity: No Food Insecurity (08/26/2022)   Hunger Vital Sign    Worried About Running Out of Food in the Last Year: Never true    Ran Out of Food in the Last Year: Never true  Transportation Needs: No Transportation Needs (08/26/2022)   PRAPARE - Administrator, Civil Service (Medical): No    Lack of Transportation (Non-Medical): No  Physical Activity: Inactive (07/15/2022)   Exercise Vital Sign    Days of Exercise per Week: 0 days    Minutes of Exercise per Session: 0  min  Stress: No Stress Concern Present (07/15/2022)   [employer] of Occupational Health - Occupational Stress Questionnaire    Feeling of Stress : Not at all  Social Connections: Moderately Integrated (07/15/2022)   Social Connection and Isolation Panel [NHANES]    Frequency of Communication with Friends and Family: More than three times a week    Frequency of Social Gatherings with Friends and Family: More than three times a week    Attends Religious Services: More than 4 times per year    Active Member of [employer] or Organizations: Yes    Attends Engineer, structural: More than 4 times per year    Marital Status: Divorced  Intimate Partner Violence: Not At Risk (08/17/2022)   Humiliation, Afraid, Rape, and Kick questionnaire    Fear of Current or Ex-Partner: No    Emotionally Abused: No    Physically Abused:  No    Sexually Abused: No   Family Status  Relation Name Status   Mother  Deceased   Father  Deceased   Sister  (Not Specified)   Sister  (Not Specified)   Brother  (Not Specified)   Family History  Problem Relation Age of Onset   Hypertension Mother    Renal Disease Mother    High blood pressure Mother    Heart disease Mother    Cancer Father        stomach   Heart disease Father        No details   Cervical cancer Sister    Diabetes Sister    Multiple myeloma Sister    Cancer Brother    Diabetes Brother    Allergies  Allergen Reactions   Sulfa Antibiotics Hives and Rash      Review of Systems  Constitutional: Negative.   HENT: Negative.    Eyes: Negative.   Respiratory: Negative.    Cardiovascular: Negative.   Genitourinary: Negative.   Musculoskeletal: Negative.   Skin: Negative.   Neurological: Negative.   Psychiatric/Behavioral: Negative.        Objective:     BP 138/61   Pulse 63   Temp (!) 97.2 F (36.2 C)   Ht 5\' 4"  (1.626 m)   Wt (!) 320 lb 9.6 oz (145.4 kg)   SpO2 100%   BMI 55.03 kg/m  BP Readings from Last 3  Encounters:  01/26/23 138/61  01/12/23 120/66  01/01/23 100/70   Wt Readings from Last 3 Encounters:  01/26/23 (!) 320 lb 9.6 oz (145.4 kg)  01/12/23 (!) 309 lb 6.4 oz (140.3 kg)  01/01/23 (!) 316 lb (143.3 kg)      Physical Exam Constitutional:      Appearance: Normal appearance. She is obese.  Eyes:     Pupils: Pupils are equal, round, and reactive to light.  Cardiovascular:     Rate and Rhythm: Normal rate and regular rhythm.  Pulmonary:     Effort: Pulmonary effort is normal.     Breath sounds: Wheezing present.  Abdominal:     General: Bowel sounds are normal.  Musculoskeletal:     Right lower leg: 1+ Pitting Edema present.     Left lower leg: 1+ Pitting Edema present.  Neurological:     General: No focal deficit present.     Mental Status: She is alert and oriented to person, place, and time. Mental status is at baseline.  Psychiatric:        Mood and Affect: Mood normal.        Behavior: Behavior normal.        Thought Content: Thought content normal.        Judgment: Judgment normal.      No results found for any visits on 01/26/23.  Last CBC Lab Results  Component Value Date   WBC 5.7 12/18/2022   HGB 10.9 (L) 12/18/2022   HCT 34.6 (L) 12/18/2022   MCV 73.3 (L) 12/18/2022   MCH 23.1 (L) 12/18/2022   RDW 14.3 12/18/2022   PLT 163 12/18/2022   Last metabolic panel Lab Results  Component Value Date   GLUCOSE 106 (H) 12/18/2022   NA 143 12/18/2022   K 4.3 12/18/2022   CL 102 12/18/2022   CO2 35 (H) 12/18/2022   BUN 35 (H) 12/18/2022   CREATININE 1.59 (H) 12/18/2022   GFRNONAA 35 (L) 12/18/2022   CALCIUM 9.2 12/18/2022   PHOS 4.2  12/19/2019   PROT 7.2 12/18/2022   ALBUMIN 4.0 12/18/2022   LABGLOB 2.3 10/27/2022   AGRATIO 1.7 10/27/2022   BILITOT 0.4 12/18/2022   ALKPHOS 142 (H) 12/18/2022   AST 17 12/18/2022   ALT 7 12/18/2022   ANIONGAP 6 12/18/2022   Last lipids Lab Results  Component Value Date   CHOL 183 10/27/2022   HDL 54  10/27/2022   LDLCALC 112 (H) 10/27/2022   TRIG 92 10/27/2022   CHOLHDL 3.4 10/27/2022   Last hemoglobin A1c Lab Results  Component Value Date   HGBA1C 5.6 09/24/2022   Last thyroid functions Lab Results  Component Value Date   TSH 2.810 09/24/2022   T4TOTAL 6.8 09/24/2022   Last vitamin D Lab Results  Component Value Date   VD25OH 8.3 (L) 01/29/2022   Last vitamin B12 and Folate Lab Results  Component Value Date   VITAMINB12 830 01/29/2022   FOLATE 13.8 05/25/2014      The ASCVD Risk score (Arnett DK, et al., 2019) failed to calculate for the following reasons:   The patient has a prior MI or stroke diagnosis    Assessment & Plan:   Problem List Items Addressed This Visit       Cardiovascular and Mediastinum   Essential hypertension - Primary   Relevant Orders   Basic Metabolic Panel     Other   Lower extremity edema   Relevant Orders   Basic Metabolic Panel   Other Visit Diagnoses     BMI 50.0-59.9, adult (HCC)       Stage 3 chronic kidney disease, unspecified whether stage 3a or 3b CKD (HCC)       Relevant Orders   Basic Metabolic Panel     1. Essential hypertension BP 138/61   Pulse 63   Temp (!) 97.2 F (36.2 C)   Ht 5\' 4"  (1.626 m)   Wt (!) 320 lb 9.6 oz (145.4 kg)   SpO2 100%   BMI 55.03 kg/m   - Basic Metabolic Panel  2. BMI 50.0-59.9, adult [facility]) The patient is asked to make an attempt to improve diet and exercise patterns to aid in medical management of this problem.   3. Stage 3 chronic kidney disease, unspecified whether stage 3a or 3b CKD (HCC)  - Basic Metabolic Panel  4. Lower extremity edema Will continue furosemide - Basic Metabolic Panel   Return in about 6 months (around 07/29/2023) for obesity, prediabetes.   Nolon Nations  APRN, MSN, FNP-C Patient Care [facility] [address] [place], [place] [address] [phone]

## 2023-01-27 LAB — BASIC METABOLIC PANEL
BUN/Creatinine Ratio: 20 (ref 12–28)
BUN: 30 mg/dL — ABNORMAL HIGH (ref 8–27)
CO2: 24 mmol/L (ref 20–29)
Calcium: 9 mg/dL (ref 8.7–10.3)
Chloride: 103 mmol/L (ref 96–106)
Creatinine, Ser: 1.5 mg/dL — ABNORMAL HIGH (ref 0.57–1.00)
Glucose: 80 mg/dL (ref 70–99)
Potassium: 4.3 mmol/L (ref 3.5–5.2)
Sodium: 142 mmol/L (ref 134–144)
eGFR: 37 mL/min/{1.73_m2} — ABNORMAL LOW (ref >59)

## 2023-01-29 ENCOUNTER — Telehealth: Payer: Self-pay | Admitting: Internal Medicine

## 2023-01-29 ENCOUNTER — Ambulatory Visit (INDEPENDENT_AMBULATORY_CARE_PROVIDER_SITE_OTHER): Payer: Medicare PPO | Admitting: Internal Medicine

## 2023-01-29 ENCOUNTER — Encounter: Payer: Self-pay | Admitting: Internal Medicine

## 2023-01-29 VITALS — BP 116/68 | HR 75 | Temp 98.2°F | Ht 64.0 in | Wt 314.8 lb

## 2023-01-29 DIAGNOSIS — J9612 Chronic respiratory failure with hypercapnia: Secondary | ICD-10-CM

## 2023-01-29 DIAGNOSIS — J4540 Moderate persistent asthma, uncomplicated: Secondary | ICD-10-CM | POA: Diagnosis not present

## 2023-01-29 DIAGNOSIS — J9611 Chronic respiratory failure with hypoxia: Secondary | ICD-10-CM | POA: Diagnosis not present

## 2023-01-29 DIAGNOSIS — J383 Other diseases of vocal cords: Secondary | ICD-10-CM | POA: Diagnosis not present

## 2023-01-29 NOTE — Telephone Encounter (Signed)
Patient would like instructions on how to send her heart monitor back to the company.

## 2023-01-29 NOTE — Patient Instructions (Addendum)
My office will be contacting you by phone for referral to ENT specialist   - if you don't hear back from my office within one week please call us back or notify us thru MyChart and we'll address it right away.   For cough > mucinex dm 1200 mg every 12 hours and use flutter valve as much as you can   No change in your medications - if getting worse please return to Dr Celine Mans   Please schedule a follow up office visit in 6-8  weeks, call sooner if needed with all medications /inhalers/ solutions in hand so we can verify exactly what you are taking. This includes all medications from all doctors and over the counters - PLEASE separate them into two bags:  the ones you take automatically, no matter what, vs the ones you take just when you feel you need them "BAG #2 is UP TO YOU"  - this will really help [place] help you take your medications more effectively.

## 2023-01-29 NOTE — Progress Notes (Unsigned)
Subjective:    Patient ID: Pamela [name], female    DOB: 03/27/1951  MRN: [id number]  Brief patient profile:  52 yobf never smoker but lots of second hand exp with 1st asthma attack in 1990s and on maint rx since late 90's  and freq courses of prednisone=  3-4 x per year despite advair and spiriva and freq saba referred to pulmonary clinic 04/27/2014 by Dr Ashley Royalty s/p CT Bx 06/25/11 > no path report epic  but per oncology = [id number] adenoca of lung >  RT only per pt RUL completed 09/2011.     History of Present Illness  04/27/2014 1st [facility] office visit/ Pamela [name]   Chief Complaint  Patient presents with   Pulmonary Consult    Referred by Dr. Hart Rochester. [name]  Pt recently moved to [place] from [place] and states needs to establish with new pulmonary MD.  She c/o DOE and cough, and states that she feels these symptoms are currently under control.    on prednisone 10 mg daily  since late July 2015 and still on freq saba and 2lpm  Already used   2puffs proair am of ov  rec Stop spiriva and advair Start dulera 100 Take 2 puffs first thing in am and then another 2 puffs about 12 hours later.  Work on inhaler technique:  relax and gently blow all the way out then take a nice smooth deep breath back in, triggering the inhaler at same time you start breathing in.  Hold for up to 5 seconds if you can.  Rinse and gargle with water when done Only use your albuterol (proair) as a rescue medication  If proair not helping, then use the neb and if needing the neb more than occastional, then take prednisone 10 mg daily  Please schedule a follow up office visit in 4 weeks, sooner if needed with all inhalers in hand for pfts on return    Admit date: 08/16/2022 Discharge date: 08/25/2022      Recommendations for Outpatient Follow-up:  Follow up with PCP in 1-2 weeks Please obtain BMP/CBC in one week your next doctors visit.  Prednisone and  iron tablets prescribed.  Advised to follow-up outpatient pulmonary next 5 to 7 days. She tells me she is enough bronchodilators at home. 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  Over the course of several days patient started doing better, she completed course of doxycycline in the hospital.  IV Solu-Medrol was transitioned to p.o. prednisone. PT/OT recommended home health therefore arrangements were made.       Assessment & Plan:  Principal Problem:   COPD with acute exacerbation (HCC)   Chronic diastolic CHF (congestive heart failure) (HCC)   Stage 3b chronic kidney disease (HCC)   Prediabetes   Obstructive sleep apnea   Acute on chronic respiratory failure with hypoxia and hypercapnia (HCC)   Vitamin D deficiency   Acute respiratory failure with hypercapnia (HCC)     Acute respiratory failure with hypoxia and hypercarbia likely due to COPD exacerbation:  Overall patient was very slow to improve and today her breath sounds are much clear.  She has completed course of doxycycline.  Transition to p.o. prednisone for 3 more days.  Continue using home bronchodilators, she tells me she has enough supplies at home.  I have advised she follow-up outpatient with her pulmonary next 5 to 7 days.     Chronic diastolic CHF (congestive heart failure) (HCC) BN P unremarkable, 2D echo showed an EF of 60% no wall motion abnormality. Continue Lasix 40 mg orally daily   Chronic kidney disease stage IIIb:  Creatinine around baseline of 1.4.  On daily iron at home         08/27/2022  [name] re: asthma/MO   maint on Trelelgy  200 finished prednisone/ refuses sleep aid other 02   Chief Complaint  Patient presents with   Hospitalization Follow-up    COPD exacerbation.  Patient states she is doing much better.  Improved SOB, Cough and wheeze  Dyspnea:  [employer] walking  baseline with care/ now use rollator due to balance  Cough: none though freq throat clearing  Sleeping: flat bed 4 pillows  SABA use: not using cpap and refuses to consider it again  02: 3lpm 24/7 was 2  Covid status:   vax x for last one,  had the infection 2020  Rec Pantoprazole (protonix) 40 mg   Take  30-60 min before first meal of the day and Pepcid (famotidine)  20 mg after supper until return to office   GERD diet reviewed, bed blocks rec  Please remember to go to the lab department   for your tests - we will call you with the results when they are available.  Please schedule a follow up office visit in 6 weeks, call sooner if needed with all medications /inhalers/ solutions in hand    10/08/2022  f/u ov/Pamela [name] re: asthma/M0   maint on nothing regularly (no trelegy/ montelukast )  and confused with maint vs  prns  Chief Complaint  Patient presents with   Follow-up    Doing well.  Some cough today.  Dyspnea:  [employer] walking and does  half a block at home with sats ok  on 3lpm  Cough: no producttion  Sleeping: flat bed 4 pillows  SABA use: rarely  02: 3lpm 24/7  Rec Leave montelukast off to see what kind of year you have with your sinuses  If cough gets worse > Pantoprazole (protonix) 40 mg   Take  30-60 min before first meal of the day and Pepcid (famotidine)  20 mg after supper until cough is gone for at  least a week  Make sure you check your oxygen saturation  AT  your highest level of activity (not after you stop)   to be sure it stays over 90%  For short ness of breath, worse wheezing > albuterol nebulizer 2.5 mg twice daily with budesonide 0.25     01/29/2023   f/u ov/Pamela [name] re: asthma/MO   maint on montelukast  /  qid alb/bud and gerd rx  though easily confused with details of care  Chief Complaint  Patient presents with   Follow-up    States using O2, having cough  with some clear sputum at time, DOE at times  Dyspnea:  says better  Cough: worse x one month Sleeping: flat 4 pillows no cough or sob  SABA use: not helping at qid alb / bud  02: 3lpm  No obvious day to day or daytime variability or assoc excess/ purulent sputum or mucus plugs or hemoptysis or cp or chest tightness, subjective wheeze or overt sinus or hb symptoms.   *** without nocturnal  or early am exacerbation  of respiratory  c/o's or need for noct saba. Also denies any obvious fluctuation of symptoms with weather or environmental changes or other aggravating or alleviating factors except as outlined above   No unusual exposure hx or h/o childhood pna/ asthma or knowledge of premature birth.  Current Allergies, Complete Past Medical History, Past Surgical History, Family History, and Social History were reviewed in [employer] record.  ROS  The following are not active complaints unless bolded Hoarseness, sore throat, dysphagia, dental problems, itching, sneezing,  nasal congestion or discharge of excess mucus or purulent secretions, ear ache,   fever, chills, sweats, unintended wt loss or wt gain, classically pleuritic or exertional cp,  orthopnea pnd or arm/hand swelling  or leg swelling, presyncope, palpitations, abdominal pain, anorexia, nausea, vomiting, diarrhea  or change in bowel habits or change in bladder habits, change in stools or change in urine, dysuria, hematuria,  rash, arthralgias, visual complaints, headache, numbness, weakness or ataxia or problems with walking or coordination,  change in mood or  memory.        Current Meds  Medication Sig   acetaminophen (TYLENOL) 500 MG tablet Take 1,000 mg by mouth every 6 (six) hours as needed  for mild pain.   albuterol (PROAIR HFA) 108 (90 Base) MCG/ACT inhaler Inhale 2 puffs into the lungs every 4 (four) hours as needed for wheezing or shortness of breath.   albuterol (PROVENTIL) (2.5 MG/3ML) 0.083% nebulizer solution USE 1 VIAL IN NEBULIZER EVERY 6 HOURS AS NEEDED FOR WHEEZING FOR SHORTNESS OF BREATH   aspirin EC 81 MG tablet Take 1 tablet (81 mg total) by mouth daily. Swallow whole.   atorvastatin (LIPITOR) 40 MG tablet Take 1 tablet (40 mg total) by mouth daily.   budesonide (PULMICORT) 0.25 MG/2ML nebulizer solution One twice daily with albuterol   calcitRIOL (ROCALTROL) 0.25 MCG capsule Take 1 capsule (0.25 mcg total) by mouth daily.   cetirizine (ZYRTEC) 10 MG chewable tablet Chew 10 mg by mouth daily.   docusate sodium (STOOL SOFTENER) 100 MG capsule Take 100 mg by mouth daily.   famotidine (PEPCID) 20 MG tablet One after supper (Patient taking differently: Take 20 mg by mouth every evening. One after supper.  Takes PRN.)   febuxostat (ULORIC) 40 MG tablet Take 1 tablet (40 mg total) by mouth daily.   furosemide (LASIX) 40 MG tablet Take 1 tablet (40 mg total) by mouth daily.   Incontinence Supply Disposable (DEPEND UNDERWEAR LARGE/XL) MISC 1 each by Does not apply route 2 times daily at 12 noon and 4 pm.   montelukast (SINGULAIR) 10 MG tablet Take 1 tablet (10 mg total) by mouth at bedtime.   pantoprazole (PROTONIX) 40 MG tablet TAKE 1 TABLET EVERY DAY 30 TO 60 MINUTES BEFORE FIRST MEAL OF THE DAY              Objective:   Physical Exam  Wts  01/29/2023   314   10/08/2022   327  08/27/2022     314  11/12/2021   354  11/12/2020   342 05/13/2020 325  04/01/2020  330   02/23/2019    354   08/30/2018  366   08/02/2018  360   11/30/2014   343  Vital signs reviewed  01/29/2023  - Note at rest 02 sats  95% on 3lpm    General appearance:    massively obese slt hoarse bf nad with classic pw worse on insp

## 2023-01-30 NOTE — Assessment & Plan Note (Signed)
Started 2lpm 24/7 in [place] around 2013  See pfts 07/19/2014 with completely reversible airflow obst (so this is not copd) and a dlco of 71% so this is not ILD - [facility]   05/26/16  = 34 - [facility]03    02/06/17  = 27  - [facility]   08/25/22    = 34 / refused sleep medicine eval/ any form or pos pressure vent   Rec continue 3lpm but daytime titrate to sats > 90%

## 2023-01-30 NOTE — Assessment & Plan Note (Signed)
Complicated by hbp/ pre-dm    Body mass index is 54.04 kg/m.  -  trending down slightly / re-encouraged  Lab Results  Component Value Date   TSH 2.810 09/24/2022      Contributing to doe and risk of GERD >>>   reviewed the need and the process to achieve and maintain neg calorie balance > defer f/u primary care including intermittently monitoring thyroid status            Each maintenance medication was reviewed in detail including emphasizing most importantly the difference between maintenance and prns and under what circumstances the prns are to be triggered using an action plan format where appropriate.  Total time for H and P, chart review, counseling, reviewing neb/02 device(s) and generating customized AVS unique to this office visit / same day charting  > 30 min for multiple  refractory respiratory  symptoms of uncertain etiology

## 2023-01-30 NOTE — Assessment & Plan Note (Signed)
Onset 1990's while living in [place]  pfts 07/31/13  FEV1  1.53 (58%) with ratio 66 > p saba ratio 74 FEV1 1.68 (64%)  - -04/27/2014  try dulera 100 2 bid  - PFT's 07/19/2014 FEV1 2.00 (93%) and ratio 85 after 21% improvement from saba with no inhalers x one week  - trial off acei 12/01/2014 due to pseudoasthma component > resolved  - Spirometry 06/01/2016  FEV1 1.06 (48%)  Ratio 61  - FENO 06/01/2016  =   85 on symbicort 80 x2 > increase to 160  - Prednisone 10 mg floor x years, rec increase  to 20 mg if needing neb  - 10/15/2016 reduced pred to 5 mg daily (gout flares with taper)  - Spirometry 02/22/2018  FEV1 1.23 (57%)  Ratio 71 with min curvature  - FENO 02/22/2018  = 61 - 02/22/2018  After extensive coaching inhaler device  effectiveness =    90% from floor of 75%   - FENO 08/27/2022 = 8 on trelegy 200 so rec change to trelegy 100 (raspy voice on 200 with mild pseudowheeze)  - 01/29/2023 wheezing refractory to qid albuteral neb with predominant insp noise transmitted from upper aiway apparently not present or bothersome at hs strongly suggests VCD or some other upper airway component > ent eval next step asap.  Dr Celine Mans was concerned re tracheomalacia but pfts as recently as 05/22/2022 have not shown obst pattern and wheezing is on inspiration today making this less likely a factor.   In meantime  for cough/ congestion >  mucinex dm  up to maximum of  1200 mg every 12 hours and use the flutter valve as much as you can    F/u in 6 weeks with all meds in hand using a trust but verify approach to confirm accurate Medication  Reconciliation The principal here is that until we are certain that the  patients are doing what we've asked, it makes no sense to ask them to do more.

## 2023-02-01 NOTE — Telephone Encounter (Signed)
LMOVM for patient to give us a call back. 

## 2023-02-02 NOTE — Telephone Encounter (Signed)
Spoke with pt and she will bring her monitor on her next appt so we can send it back for her

## 2023-02-06 DIAGNOSIS — J441 Chronic obstructive pulmonary disease with (acute) exacerbation: Secondary | ICD-10-CM | POA: Diagnosis not present

## 2023-02-06 DIAGNOSIS — I5032 Chronic diastolic (congestive) heart failure: Secondary | ICD-10-CM | POA: Diagnosis not present

## 2023-02-06 DIAGNOSIS — J9601 Acute respiratory failure with hypoxia: Secondary | ICD-10-CM | POA: Diagnosis not present

## 2023-02-10 DIAGNOSIS — J45909 Unspecified asthma, uncomplicated: Secondary | ICD-10-CM | POA: Diagnosis not present

## 2023-02-10 DIAGNOSIS — J9601 Acute respiratory failure with hypoxia: Secondary | ICD-10-CM | POA: Diagnosis not present

## 2023-02-10 DIAGNOSIS — I5032 Chronic diastolic (congestive) heart failure: Secondary | ICD-10-CM | POA: Diagnosis not present

## 2023-02-23 ENCOUNTER — Encounter (HOSPITAL_COMMUNITY): Payer: Self-pay

## 2023-02-23 ENCOUNTER — Other Ambulatory Visit: Payer: Self-pay

## 2023-02-23 ENCOUNTER — Emergency Department (HOSPITAL_COMMUNITY): Payer: Medicare PPO

## 2023-02-23 ENCOUNTER — Inpatient Hospital Stay (HOSPITAL_COMMUNITY)
Admission: EM | Admit: 2023-02-23 | Discharge: 2023-03-03 | DRG: 190 | Disposition: A | Discharge status: Home Health | Payer: Medicare PPO | Attending: Internal Medicine | Admitting: Internal Medicine

## 2023-02-23 DIAGNOSIS — D869 Sarcoidosis, unspecified: Secondary | ICD-10-CM | POA: Diagnosis present

## 2023-02-23 DIAGNOSIS — Z833 Family history of diabetes mellitus: Secondary | ICD-10-CM

## 2023-02-23 DIAGNOSIS — I130 Hypertensive heart and chronic kidney disease with heart failure and stage 1 through stage 4 chronic kidney disease, or unspecified chronic kidney disease: Secondary | ICD-10-CM | POA: Diagnosis not present

## 2023-02-23 DIAGNOSIS — Q600 Renal agenesis, unilateral: Secondary | ICD-10-CM | POA: Diagnosis not present

## 2023-02-23 DIAGNOSIS — I5033 Acute on chronic diastolic (congestive) heart failure: Secondary | ICD-10-CM | POA: Diagnosis present

## 2023-02-23 DIAGNOSIS — R32 Unspecified urinary incontinence: Secondary | ICD-10-CM | POA: Diagnosis present

## 2023-02-23 DIAGNOSIS — M1990 Unspecified osteoarthritis, unspecified site: Secondary | ICD-10-CM | POA: Diagnosis present

## 2023-02-23 DIAGNOSIS — J9621 Acute and chronic respiratory failure with hypoxia: Secondary | ICD-10-CM | POA: Diagnosis not present

## 2023-02-23 DIAGNOSIS — Z841 Family history of disorders of kidney and ureter: Secondary | ICD-10-CM

## 2023-02-23 DIAGNOSIS — G4730 Sleep apnea, unspecified: Secondary | ICD-10-CM | POA: Diagnosis present

## 2023-02-23 DIAGNOSIS — R918 Other nonspecific abnormal finding of lung field: Secondary | ICD-10-CM | POA: Diagnosis not present

## 2023-02-23 DIAGNOSIS — E785 Hyperlipidemia, unspecified: Secondary | ICD-10-CM | POA: Diagnosis present

## 2023-02-23 DIAGNOSIS — D509 Iron deficiency anemia, unspecified: Secondary | ICD-10-CM | POA: Diagnosis present

## 2023-02-23 DIAGNOSIS — Z9981 Dependence on supplemental oxygen: Secondary | ICD-10-CM

## 2023-02-23 DIAGNOSIS — D696 Thrombocytopenia, unspecified: Secondary | ICD-10-CM | POA: Diagnosis not present

## 2023-02-23 DIAGNOSIS — Z8249 Family history of ischemic heart disease and other diseases of the circulatory system: Secondary | ICD-10-CM | POA: Diagnosis not present

## 2023-02-23 DIAGNOSIS — R7303 Prediabetes: Secondary | ICD-10-CM | POA: Diagnosis present

## 2023-02-23 DIAGNOSIS — M25561 Pain in right knee: Secondary | ICD-10-CM | POA: Diagnosis present

## 2023-02-23 DIAGNOSIS — N1832 Chronic kidney disease, stage 3b: Secondary | ICD-10-CM | POA: Diagnosis present

## 2023-02-23 DIAGNOSIS — Z85118 Personal history of other malignant neoplasm of bronchus and lung: Secondary | ICD-10-CM

## 2023-02-23 DIAGNOSIS — R0602 Shortness of breath: Secondary | ICD-10-CM | POA: Diagnosis not present

## 2023-02-23 DIAGNOSIS — Z6841 Body Mass Index (BMI) 40.0 and over, adult: Secondary | ICD-10-CM | POA: Diagnosis not present

## 2023-02-23 DIAGNOSIS — K219 Gastro-esophageal reflux disease without esophagitis: Secondary | ICD-10-CM | POA: Diagnosis present

## 2023-02-23 DIAGNOSIS — J9610 Chronic respiratory failure, unspecified whether with hypoxia or hypercapnia: Secondary | ICD-10-CM | POA: Diagnosis not present

## 2023-02-23 DIAGNOSIS — I5032 Chronic diastolic (congestive) heart failure: Secondary | ICD-10-CM

## 2023-02-23 DIAGNOSIS — J45901 Unspecified asthma with (acute) exacerbation: Secondary | ICD-10-CM

## 2023-02-23 DIAGNOSIS — Z79899 Other long term (current) drug therapy: Secondary | ICD-10-CM

## 2023-02-23 DIAGNOSIS — I517 Cardiomegaly: Secondary | ICD-10-CM | POA: Diagnosis not present

## 2023-02-23 DIAGNOSIS — Z1152 Encounter for screening for COVID-19: Secondary | ICD-10-CM | POA: Diagnosis not present

## 2023-02-23 DIAGNOSIS — Z7951 Long term (current) use of inhaled steroids: Secondary | ICD-10-CM

## 2023-02-23 DIAGNOSIS — M25562 Pain in left knee: Secondary | ICD-10-CM | POA: Diagnosis present

## 2023-02-23 DIAGNOSIS — J441 Chronic obstructive pulmonary disease with (acute) exacerbation: Secondary | ICD-10-CM | POA: Diagnosis not present

## 2023-02-23 DIAGNOSIS — D573 Sickle-cell trait: Secondary | ICD-10-CM | POA: Diagnosis present

## 2023-02-23 DIAGNOSIS — E875 Hyperkalemia: Secondary | ICD-10-CM | POA: Diagnosis present

## 2023-02-23 DIAGNOSIS — J4541 Moderate persistent asthma with (acute) exacerbation: Secondary | ICD-10-CM

## 2023-02-23 DIAGNOSIS — J9612 Chronic respiratory failure with hypercapnia: Secondary | ICD-10-CM | POA: Diagnosis present

## 2023-02-23 DIAGNOSIS — J398 Other specified diseases of upper respiratory tract: Secondary | ICD-10-CM

## 2023-02-23 DIAGNOSIS — J9690 Respiratory failure, unspecified, unspecified whether with hypoxia or hypercapnia: Secondary | ICD-10-CM | POA: Diagnosis not present

## 2023-02-23 DIAGNOSIS — J9602 Acute respiratory failure with hypercapnia: Secondary | ICD-10-CM | POA: Diagnosis not present

## 2023-02-23 DIAGNOSIS — J4551 Severe persistent asthma with (acute) exacerbation: Secondary | ICD-10-CM

## 2023-02-23 DIAGNOSIS — J449 Chronic obstructive pulmonary disease, unspecified: Secondary | ICD-10-CM | POA: Diagnosis not present

## 2023-02-23 DIAGNOSIS — J9809 Other diseases of bronchus, not elsewhere classified: Secondary | ICD-10-CM | POA: Diagnosis present

## 2023-02-23 DIAGNOSIS — Z882 Allergy status to sulfonamides status: Secondary | ICD-10-CM

## 2023-02-23 DIAGNOSIS — R609 Edema, unspecified: Secondary | ICD-10-CM | POA: Diagnosis not present

## 2023-02-23 DIAGNOSIS — J9622 Acute and chronic respiratory failure with hypercapnia: Secondary | ICD-10-CM | POA: Diagnosis not present

## 2023-02-23 DIAGNOSIS — N189 Chronic kidney disease, unspecified: Secondary | ICD-10-CM

## 2023-02-23 LAB — BLOOD GAS, VENOUS
Acid-Base Excess: 14.2 mmol/L — ABNORMAL HIGH (ref 0.0–2.0)
Bicarbonate: 46.1 mmol/L — ABNORMAL HIGH (ref 20.0–28.0)
O2 Saturation: 95.5 %
Patient temperature: 37
pCO2, Ven: 110 mmHg (ref 44–60)
pH, Ven: 7.23 — ABNORMAL LOW (ref 7.25–7.43)
pO2, Ven: 71 mmHg — ABNORMAL HIGH (ref 32–45)

## 2023-02-23 LAB — CBC
HCT: 45 % (ref 36.0–46.0)
Hemoglobin: 12.9 g/dL (ref 12.0–15.0)
MCH: 22.2 pg — ABNORMAL LOW (ref 26.0–34.0)
MCHC: 28.7 g/dL — ABNORMAL LOW (ref 30.0–36.0)
MCV: 77.6 fL — ABNORMAL LOW (ref 80.0–100.0)
Platelets: 154 10*3/uL (ref 150–400)
RBC: 5.8 MIL/uL — ABNORMAL HIGH (ref 3.87–5.11)
RDW: 14.9 % (ref 11.5–15.5)
WBC: 6.8 10*3/uL (ref 4.0–10.5)
nRBC: 0 % (ref 0.0–0.2)

## 2023-02-23 LAB — BASIC METABOLIC PANEL
Anion gap: 11 (ref 5–15)
BUN: 32 mg/dL — ABNORMAL HIGH (ref 8–23)
CO2: 39 mmol/L — ABNORMAL HIGH (ref 22–32)
Calcium: 9.3 mg/dL (ref 8.9–10.3)
Chloride: 93 mmol/L — ABNORMAL LOW (ref 98–111)
Creatinine, Ser: 1.47 mg/dL — ABNORMAL HIGH (ref 0.44–1.00)
GFR, Estimated: 38 mL/min — ABNORMAL LOW (ref >60)
Glucose, Bld: 103 mg/dL — ABNORMAL HIGH (ref 70–99)
Potassium: 4.4 mmol/L (ref 3.5–5.1)
Sodium: 143 mmol/L (ref 135–145)

## 2023-02-23 LAB — BRAIN NATRIURETIC PEPTIDE: B Natriuretic Peptide: 23.4 pg/mL (ref 0.0–100.0)

## 2023-02-23 LAB — MAGNESIUM: Magnesium: 1.6 mg/dL — ABNORMAL LOW (ref 1.7–2.4)

## 2023-02-23 LAB — TROPONIN I (HIGH SENSITIVITY)
Troponin I (High Sensitivity): 8 ng/L (ref <18)
Troponin I (High Sensitivity): 11 ng/L (ref <18)

## 2023-02-23 LAB — MRSA NEXT GEN BY PCR, NASAL: MRSA by PCR Next Gen: NOT DETECTED

## 2023-02-23 LAB — SARS CORONAVIRUS 2 BY RT PCR: SARS Coronavirus 2 by RT PCR: NEGATIVE

## 2023-02-23 MED ORDER — CHLORHEXIDINE GLUCONATE CLOTH 2 % EX PADS
6.0000 | MEDICATED_PAD | Freq: Every day | CUTANEOUS | Status: DC
  Administered 2023-02-23 – 2023-03-01 (×7): 6 via TOPICAL

## 2023-02-23 MED ORDER — ALBUTEROL SULFATE (2.5 MG/3ML) 0.083% IN NEBU
10.0000 mg/h | INHALATION_SOLUTION | Freq: Once | RESPIRATORY_TRACT | Status: AC
  Administered 2023-02-23: 10 mg/h via RESPIRATORY_TRACT
  Filled 2023-02-23: qty 20
  Filled 2023-02-23: qty 12

## 2023-02-23 MED ORDER — CALCITRIOL 0.25 MCG PO CAPS
0.2500 ug | ORAL_CAPSULE | Freq: Every day | ORAL | Status: DC
  Administered 2023-02-24 – 2023-03-03 (×8): 0.25 ug via ORAL
  Filled 2023-02-23 (×8): qty 1

## 2023-02-23 MED ORDER — IPRATROPIUM-ALBUTEROL 0.5-2.5 (3) MG/3ML IN SOLN
RESPIRATORY_TRACT | Status: AC
  Filled 2023-02-23: qty 3

## 2023-02-23 MED ORDER — ENOXAPARIN SODIUM 40 MG/0.4ML IJ SOSY: 40.0000 mg | PREFILLED_SYRINGE | INTRAMUSCULAR | Status: DC

## 2023-02-23 MED ORDER — MAGNESIUM SULFATE 2 GM/50ML IV SOLN
2.0000 g | Freq: Once | INTRAVENOUS | Status: AC
  Administered 2023-02-23: 2 g via INTRAVENOUS
  Filled 2023-02-23: qty 50

## 2023-02-23 MED ORDER — ONDANSETRON HCL 4 MG/2ML IJ SOLN: 4.0000 mg | Freq: Four times a day (QID) | INTRAMUSCULAR | Status: DC | PRN

## 2023-02-23 MED ORDER — METHYLPREDNISOLONE SODIUM SUCC 40 MG IJ SOLR
40.0000 mg | Freq: Two times a day (BID) | INTRAMUSCULAR | Status: DC
  Administered 2023-02-23 – 2023-02-25 (×4): 40 mg via INTRAVENOUS
  Filled 2023-02-23 (×4): qty 1

## 2023-02-23 MED ORDER — PANTOPRAZOLE SODIUM 40 MG PO TBEC
40.0000 mg | DELAYED_RELEASE_TABLET | Freq: Every day | ORAL | Status: DC
  Administered 2023-02-24 – 2023-02-25 (×2): 40 mg via ORAL
  Filled 2023-02-23 (×2): qty 1

## 2023-02-23 MED ORDER — ASPIRIN 81 MG PO TBEC
81.0000 mg | DELAYED_RELEASE_TABLET | Freq: Every day | ORAL | Status: DC
  Administered 2023-02-24 – 2023-03-03 (×8): 81 mg via ORAL
  Filled 2023-02-23 (×8): qty 1

## 2023-02-23 MED ORDER — FUROSEMIDE 40 MG PO TABS
40.0000 mg | ORAL_TABLET | Freq: Every day | ORAL | Status: DC
  Administered 2023-02-24: 40 mg via ORAL
  Filled 2023-02-23: qty 1

## 2023-02-23 MED ORDER — MONTELUKAST SODIUM 10 MG PO TABS
10.0000 mg | ORAL_TABLET | Freq: Every day | ORAL | Status: DC
  Administered 2023-02-23 – 2023-03-02 (×8): 10 mg via ORAL
  Filled 2023-02-23 (×9): qty 1

## 2023-02-23 MED ORDER — ENOXAPARIN SODIUM 80 MG/0.8ML IJ SOSY
70.0000 mg | PREFILLED_SYRINGE | INTRAMUSCULAR | Status: DC
  Administered 2023-02-23 – 2023-03-02 (×8): 70 mg via SUBCUTANEOUS
  Filled 2023-02-23 (×6): qty 0.8
  Filled 2023-02-23: qty 0.7
  Filled 2023-02-23 (×2): qty 0.8

## 2023-02-23 MED ORDER — ATORVASTATIN CALCIUM 40 MG PO TABS
40.0000 mg | ORAL_TABLET | Freq: Every day | ORAL | Status: DC
  Administered 2023-02-24 – 2023-03-03 (×8): 40 mg via ORAL
  Filled 2023-02-23 (×8): qty 1

## 2023-02-23 MED ORDER — ONDANSETRON HCL 4 MG PO TABS: 4.0000 mg | ORAL_TABLET | Freq: Four times a day (QID) | ORAL | Status: DC | PRN

## 2023-02-23 MED ORDER — ACETAMINOPHEN 650 MG RE SUPP: 650.0000 mg | Freq: Four times a day (QID) | RECTAL | Status: DC | PRN

## 2023-02-23 MED ORDER — ACETAMINOPHEN 325 MG PO TABS
650.0000 mg | ORAL_TABLET | Freq: Four times a day (QID) | ORAL | Status: DC | PRN
  Administered 2023-02-25 – 2023-03-03 (×3): 650 mg via ORAL
  Filled 2023-02-23 (×3): qty 2

## 2023-02-23 MED ORDER — ALBUTEROL SULFATE (2.5 MG/3ML) 0.083% IN NEBU
2.5000 mg | INHALATION_SOLUTION | RESPIRATORY_TRACT | Status: DC | PRN
  Administered 2023-02-23: 2.5 mg via RESPIRATORY_TRACT
  Filled 2023-02-23: qty 3

## 2023-02-23 MED ORDER — METHYLPREDNISOLONE SODIUM SUCC 125 MG IJ SOLR
125.0000 mg | Freq: Once | INTRAMUSCULAR | Status: AC
  Administered 2023-02-23: 125 mg via INTRAVENOUS
  Filled 2023-02-23: qty 2

## 2023-02-23 MED ORDER — ORAL CARE MOUTH RINSE: 15.0000 mL | OROMUCOSAL | Status: DC | PRN

## 2023-02-23 MED ORDER — SODIUM CHLORIDE 0.9 % IV SOLN
100.0000 mg | Freq: Two times a day (BID) | INTRAVENOUS | Status: DC
  Administered 2023-02-23 – 2023-02-25 (×4): 100 mg via INTRAVENOUS
  Filled 2023-02-23 (×5): qty 100

## 2023-02-23 MED ORDER — TRAZODONE HCL 50 MG PO TABS
25.0000 mg | ORAL_TABLET | Freq: Every evening | ORAL | Status: DC | PRN
  Administered 2023-02-23 – 2023-02-27 (×3): 25 mg via ORAL
  Filled 2023-02-23 (×3): qty 1

## 2023-02-23 MED ORDER — ORAL CARE MOUTH RINSE
15.0000 mL | OROMUCOSAL | Status: DC
  Administered 2023-02-23 – 2023-03-03 (×24): 15 mL via OROMUCOSAL

## 2023-02-23 MED ORDER — POLYETHYLENE GLYCOL 3350 17 G PO PACK: 17.0000 g | PACK | Freq: Every day | ORAL | Status: DC | PRN

## 2023-02-23 MED ORDER — LORATADINE 10 MG PO TABS
10.0000 mg | ORAL_TABLET | Freq: Every day | ORAL | Status: DC
  Administered 2023-02-24 – 2023-03-03 (×8): 10 mg via ORAL
  Filled 2023-02-23 (×8): qty 1

## 2023-02-23 MED ORDER — IPRATROPIUM-ALBUTEROL 0.5-2.5 (3) MG/3ML IN SOLN
3.0000 mL | Freq: Four times a day (QID) | RESPIRATORY_TRACT | Status: DC
  Administered 2023-02-23 – 2023-02-24 (×5): 3 mL via RESPIRATORY_TRACT
  Filled 2023-02-23 (×4): qty 3

## 2023-02-23 MED ORDER — DOCUSATE SODIUM 100 MG PO CAPS
100.0000 mg | ORAL_CAPSULE | Freq: Every day | ORAL | Status: DC
  Administered 2023-02-24 – 2023-03-03 (×8): 100 mg via ORAL
  Filled 2023-02-23 (×8): qty 1

## 2023-02-23 NOTE — ED Triage Notes (Signed)
Patient BIB Son. Patient reports SOB and low O2 levels x 3 days. Patient on 3L O2 baseline. Called pulmonologist and told her to turn her O2 up to 4L. Patient was 77% on 4L on arrival. Patient is 92% on 5L.

## 2023-02-23 NOTE — Progress Notes (Signed)
   02/23/23 2027  BiPAP/CPAP/SIPAP  BiPAP/CPAP/SIPAP V60 (on standby in room)  Reason BIPAP/CPAP not in use Non-compliant  BiPAP/CPAP /SiPAP Vitals  Pulse Rate 82  Resp (!) 21  SpO2 95 %  Bilateral Breath Sounds Expiratory wheezes   Pt found on 6L nasal cannula.  Pt watching tv, no respiratory distress noted or voiced by patient at this time.  Pt adamantly refused bipap tonight.  RN aware.  Pt stated she wants to wear nasal cannula instead.  Bipap remains in room on standby.

## 2023-02-23 NOTE — ED Provider Notes (Addendum)
Pamela [name] EMERGENCY DEPARTMENT AT [facility] Provider Note   CSN: [id number] Arrival date & time: 02/23/23  1231     History  Chief Complaint  Patient presents with   Shortness of Breath    Pamela [name] is a 72 y.o. female.   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  The patient has had low oxygen levels at home for the last 3 days less than 90% on her home 3 L O2 via baseline.  She endorses increasing dyspnea.  She called her pulmonologist and was advised to increase her home oxygen requirement and arrived to the emergency department 77% on 4 L.  The patient was escalated to 92% on 5 L.  She endorses wheezing, cough, denies any increasing lower extremity swelling, denies any chest pain, fevers or chills.  Home Medications Prior to Admission medications   Medication Sig Start Date End Date Taking? Authorizing Provider  acetaminophen (TYLENOL) 500 MG tablet Take 1,000 mg by mouth every 6 (six) hours as needed for mild pain.    [provider]  albuterol (PROAIR HFA) 108 (90 Base) MCG/ACT inhaler Inhale 2 puffs into the lungs every 4 (four) hours as needed for wheezing or shortness of breath. 01/01/23   Charlott Holler, MD  albuterol (PROVENTIL) (2.5 MG/3ML) 0.083% nebulizer solution USE 1 VIAL IN NEBULIZER EVERY 6 HOURS AS NEEDED FOR WHEEZING FOR SHORTNESS OF BREATH 01/01/23   Charlott Holler, MD  aspirin EC 81 MG tablet Take 1 tablet (81 mg total) by mouth daily. Swallow whole. 04/26/20   Chandrasekhar, Lafayette Dragon A, MD  atorvastatin (LIPITOR) 40 MG tablet Take 1 tablet (40 mg total) by mouth daily. 01/12/23 04/12/23  Swinyer, Zachary George, NP  budesonide (PULMICORT) 0.25 MG/2ML nebulizer solution One twice  daily with albuterol 10/08/22   Nyoka Cowden, MD  calcitRIOL (ROCALTROL) 0.25 MCG capsule Take 1 capsule (0.25 mcg total) by mouth daily. 12/29/22   Massie Maroon, FNP  cetirizine (ZYRTEC) 10 MG chewable tablet Chew 10 mg by mouth daily.    [provider]  docusate sodium (STOOL SOFTENER) 100 MG capsule Take 100 mg by mouth daily.    [provider]  famotidine (PEPCID) 20 MG tablet One after supper Patient taking differently: Take 20 mg by mouth every evening. One after supper.  Takes PRN. 08/27/22   Nyoka Cowden, MD  febuxostat (ULORIC) 40 MG tablet Take 1 tablet (40 mg total) by mouth daily. 11/03/22   Massie Maroon, FNP  ferrous sulfate 325 (65 FE) MG tablet Take 1 tablet (325 mg total) by mouth daily with lunch. 08/25/22 01/26/23  Amin, Loura Halt, MD  furosemide (LASIX) 40 MG tablet Take 1 tablet (40 mg total) by mouth daily. 12/29/22   Massie Maroon, FNP  Incontinence Supply Disposable (DEPEND UNDERWEAR LARGE/XL) MISC 1 each by Does not apply route 2 times daily at 12 noon and 4 pm. 07/28/22   Massie Maroon, FNP  montelukast (SINGULAIR) 10 MG tablet Take 1 tablet (10 mg total) by mouth at bedtime. 11/03/22   Nyoka Cowden, MD  pantoprazole (PROTONIX) 40 MG tablet TAKE 1 TABLET EVERY DAY 30 TO 60 MINUTES BEFORE FIRST MEAL OF THE DAY 11/13/22   Nyoka Cowden,  MD      Allergies    Sulfa antibiotics    Review of Systems   Review of Systems  Respiratory:  Positive for shortness of breath.   All other systems reviewed and are negative.   Physical Exam Updated Vital Signs BP 137/71   Pulse 68   Temp (!) 97.4 F (36.3 C)   Resp 16   Ht 5\' 4"  (1.626 m)   Wt (!) 142.8 kg   SpO2 100%   BMI 54.04 kg/m  Physical Exam Vitals and nursing note reviewed.  Constitutional:      General: She is in acute distress.     Appearance: She is well-developed. She is obese.  HENT:     Head: Normocephalic and atraumatic.  Eyes:     Conjunctiva/sclera: Conjunctivae  normal.  Cardiovascular:     Rate and Rhythm: Normal rate and regular rhythm.     Heart sounds: No murmur heard. Pulmonary:     Effort: Tachypnea and respiratory distress present.     Breath sounds: No stridor. Examination of the right-upper field reveals wheezing. Examination of the left-upper field reveals wheezing. Examination of the right-middle field reveals wheezing. Examination of the left-middle field reveals wheezing. Examination of the right-lower field reveals wheezing. Examination of the left-lower field reveals wheezing. Wheezing present. No rhonchi or rales.  Abdominal:     Palpations: Abdomen is soft.     Tenderness: There is no abdominal tenderness.  Musculoskeletal:        General: No swelling.     Cervical back: Neck supple.     Right lower leg: Edema present.     Left lower leg: Edema present.     Comments: 1+ pitting edema bilaterally  Skin:    General: Skin is warm and dry.     Capillary Refill: Capillary refill takes less than 2 seconds.  Neurological:     Mental Status: She is alert.  Psychiatric:        Mood and Affect: Mood normal.     ED Results / Procedures / Treatments   Labs (all labs ordered are listed, but only abnormal results are displayed) Labs Reviewed  BASIC METABOLIC PANEL - Abnormal; Notable for the following components:      Result Value   Chloride 93 (*)    CO2 39 (*)    Glucose, Bld 103 (*)    BUN 32 (*)    Creatinine, Ser 1.47 (*)    GFR, Estimated 38 (*)    All other components within normal limits  MAGNESIUM - Abnormal; Notable for the following components:   Magnesium 1.6 (*)    All other components within normal limits  CBC - Abnormal; Notable for the following components:   RBC 5.80 (*)    MCV 77.6 (*)    MCH 22.2 (*)    MCHC 28.7 (*)    All other components within normal limits  BLOOD GAS, VENOUS - Abnormal; Notable for the following components:   pH, Ven 7.23 (*)    pCO2, Ven 110 (*)    pO2, Ven 71 (*)    Bicarbonate  46.1 (*)    Acid-Base Excess 14.2 (*)    All other components within normal limits  SARS CORONAVIRUS 2 BY RT PCR  BRAIN NATRIURETIC PEPTIDE  TROPONIN I (HIGH SENSITIVITY)    EKG EKG Interpretation Date/Time:  Tuesday February 23 2023 12:42:28 EDT Ventricular Rate:  82 PR Interval:  163 QRS Duration:  99 QT Interval:  391  QTC Calculation: 457 R Axis:   -84  Text Interpretation: Sinus rhythm Consider right atrial enlargement Left anterior fascicular block Abnormal R-wave progression, late transition Probable left ventricular hypertrophy Confirmed by Ernie Avena ([id number]) on 02/23/2023 2:11:18 PM  Radiology DG Chest Portable 1 View  Result Date: 02/23/2023 CLINICAL DATA:  Respiratory failure EXAM: PORTABLE CHEST 1 VIEW COMPARISON:  Chest radiograph 08/16/2018 FINDINGS: A cardiac loop recorder is again right. The cardiomediastinal silhouette is normal. There is no focal consolidation or pulmonary edema. There is no pleural effusion or pneumothorax There is no acute osseous abnormality. IMPRESSION: Stable chest with no radiographic evidence of acute cardiopulmonary process. Electronically Signed   By: Lesia Hausen M.D.   On: 02/23/2023 13:42    Procedures .Critical Care  Performed by: Ernie Avena, MD Authorized by: Ernie Avena, MD   Critical care provider statement:    Critical care time (minutes):  30   Critical care was necessary to treat or prevent imminent or life-threatening deterioration of the following conditions:  Respiratory failure   Critical care was time spent personally by me on the following activities:  Development of treatment plan with patient or surrogate, discussions with consultants, evaluation of patient's response to treatment, examination of patient, ordering and review of laboratory studies, ordering and review of radiographic studies, ordering and performing treatments and interventions, pulse oximetry, re-evaluation of patient's condition and review of old  charts   Care discussed with: admitting provider       Medications Ordered in ED Medications  albuterol (PROVENTIL) (2.5 MG/3ML) 0.083% nebulizer solution (10 mg/hr Nebulization Given 02/23/23 1314)  magnesium sulfate IVPB 2 g 50 mL (0 g Intravenous Stopped 02/23/23 1405)  methylPREDNISolone sodium succinate (SOLU-MEDROL) 125 mg/2 mL injection 125 mg (125 mg Intravenous Given 02/23/23 1306)    ED Course/ Medical Decision Making/ A&P Clinical Course as of 02/23/23 1450  Tue Feb 23, 2023  1443 pCO2, Ven(!!): 110 [JL]  1443 pH, Ven(!): 7.23 [JL]    Clinical Course User Index [JL] Ernie Avena, MD                                 Medical Decision Making Amount and/or Complexity of Data Reviewed Labs: ordered. Decision-making details documented in ED Course. Radiology: ordered.  Risk Prescription drug management. Decision regarding hospitalization.    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  The patient has had low oxygen levels at home for the last 3 days less than 90% on her home 3 L O2 via baseline.  She endorses increasing dyspnea.  She called her pulmonologist and was advised to increase her home oxygen requirement and arrived to the emergency department 77% on 4 L.  The patient was escalated to 92% on 5 L.  She endorses wheezing, cough, denies any increasing lower extremity swelling, denies any chest pain, fevers or chills.  On arrival, the patient was initially hypoxic, saturating 73% on 4 L O2 via nasal cannula, tachypneic RR 28, not tachycardic pulse 91, afebrile temperature 97.4.  The patient was escalated to 5 L O2 with improvement in her oxygen saturations to 100%.  She was administered continuous albuterol by nebulizer.  She states that  she has been doing DuoNebs at home without  relief.  She was administered IV magnesium and Solu-Medrol after IV access was obtained.  Chest x-ray was performed which revealed no acute cardiac or pulmonary abnormality.  An EKG showed no concerning ST segment changes.  Initial laboratory evaluation revealed a BNP which was normal.  Suspect likely asthma/COPD exacerbation rather than CHF exacerbation.  COVID-19 PCR testing was collected and pending.  Magnesium was hypomagnesemic to 1.6.  BMP revealed no evidence of significant AKI, creatinine appears to be at baseline at 1.47, CBC without leukocytosis or anemia.  Troponin collected and pending in addition to a VBG.  VBG: Hypercarbic respiratory failure with a pCO2 of 110, pH of 10.23.  The patient was started on BiPAP.  On repeat assessment, the patient felt symptomatically improved however given her initial presentation of hypoxic and hypercarbic respiratory failure in the setting of a severe asthma exacerbation, I recommended admission for observation for continued monitoring and continued nebulizer treatments, continued BiPAP, Dr. Erenest Blank accepting.   Final Clinical Impression(s) / ED Diagnoses Final diagnoses:  Severe asthma with exacerbation, unspecified whether persistent  Acute on chronic respiratory failure with hypoxia and hypercapnia [facility])    Rx / DC Orders ED Discharge Orders     None           Ernie Avena, MD 02/23/23 1450

## 2023-02-23 NOTE — ED Notes (Signed)
ED TO INPATIENT HANDOFF REPORT  ED Nurse Name and Phone #: Crist Infante, RN [phone]  S Name/Age/Gender Pamela [name] 72 y.o. female Room/Bed: RESB/RESB  Code Status   Code Status: Full Code  Home/SNF/Other Home Patient oriented to: self, place, time, and situation Is this baseline? Yes   Triage Complete: Triage complete  Chief Complaint Acute on chronic respiratory acidosis (HCC) [J96.02, J96.12]  Triage Note Patient BIB Son. Patient reports SOB and low O2 levels x 3 days. Patient on 3L O2 baseline. Called pulmonologist and told her to turn her O2 up to 4L. Patient was 77% on 4L on arrival. Patient is 92% on 5L.    Allergies Allergies  Allergen Reactions   Sulfa Antibiotics Hives and Rash    Level of Care/Admitting Diagnosis ED Disposition     ED Disposition  Admit   Condition  --   Comment  Hospital Area: [facility] [[id number]]  Level of Care: Stepdown [14]  Admit to SDU based on following criteria: Respiratory Distress:  Frequent assessment and/or intervention to maintain adequate ventilation/respiration, pulmonary toilet, and respiratory treatment.  May admit patient to [facility] or [facility] if equivalent level of care is available:: Yes  Covid Evaluation: Symptomatic Person Under Investigation (PUI) or recent exposure (last 10 days) *Testing Required*  Diagnosis: Acute on chronic respiratory acidosis [facility]) [[id number]]  Admitting Physician: Maryln Gottron [[id number]]  Attending Physician: Kirby Crigler, MIR [email or web address] [[id number]]  Certification:: I certify this patient will need inpatient services for at least 2 midnights  Estimated Length of Stay: 3          B Medical/Surgery History Past Medical History:  Diagnosis Date   Arthritis    Asthma    Cancer (HCC)    lung, adenocarcinoma right lung 2012   CHF (congestive heart failure) (HCC)    Preserved EF   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 Patient notes it was consistent with sarcoidosis but actual pathology results not available.   Oxygen deficiency    Sarcoidosis    Sickle cell trait (HCC)    Sleep apnea    Past Surgical History:  Procedure Laterality Date   LOOP RECORDER INSERTION N/A 09/08/2018   Procedure: LOOP RECORDER INSERTION;  Surgeon: Marinus Maw, MD;  Location: [facility] INVASIVE CV LAB;  Service: Cardiovascular;  Laterality: N/A;   LUNG BIOPSY     TEE WITHOUT CARDIOVERSION N/A 09/08/2018   Procedure: TRANSESOPHAGEAL ECHOCARDIOGRAM (TEE);  Surgeon: Wendall Stade, MD;  Location: [facility] ENDOSCOPY;  Service: Cardiovascular;  Laterality: N/A;  with loop   TUBAL LIGATION     VEIN LIGATION AND STRIPPING       A IV Location/Drains/Wounds Patient Lines/Drains/Airways Status     Active Line/Drains/Airways     Name Placement date Placement time Site Days   Peripheral IV 08/16/22 20 G Right Antecubital 08/16/22  1530  Antecubital  191   Peripheral IV 02/23/23 20 G Right Antecubital 02/23/23  1257  Antecubital  less than 1            Intake/Output Last 24 hours  Intake/Output Summary (Last  24 hours) at 02/23/2023 1528 Last data filed at 02/23/2023 1405 Gross per 24 hour  Intake 41.49 ml  Output --  Net 41.49 ml    Labs/Imaging Results for orders placed or performed during the hospital encounter of 02/23/23 (from the past 48 hour(s))  Basic metabolic panel     Status: Abnormal   Collection Time: 02/23/23 12:56 PM  Result Value Ref Range   Sodium 143 135 - 145 mmol/L   Potassium 4.4 3.5 - 5.1 mmol/L   Chloride 93 (L) 98 - 111 mmol/L   CO2 39 (H) 22 - 32 mmol/L   Glucose, Bld 103 (H) 70 - 99 mg/dL    Comment: Glucose reference  range applies only to samples taken after fasting for at least 8 hours.   BUN 32 (H) 8 - 23 mg/dL   Creatinine, Ser 1.61 (H) 0.44 - 1.00 mg/dL   Calcium 9.3 8.9 - 09.6 mg/dL   GFR, Estimated 38 (L) >60 mL/min    Comment: (NOTE) Calculated using the CKD-EPI Creatinine Equation (2021)    Anion gap 11 5 - 15    Comment: Performed at [facility], [address] [place], [place] [address]  Magnesium     Status: Abnormal   Collection Time: 02/23/23 12:56 PM  Result Value Ref Range   Magnesium 1.6 (L) 1.7 - 2.4 mg/dL    Comment: Performed at [facility], [address] [place], [place] [address]  Brain natriuretic peptide (order ONLY if patient c/o SOB)     Status: None   Collection Time: 02/23/23 12:56 PM  Result Value Ref Range   B Natriuretic Peptide 23.4 0.0 - 100.0 pg/mL    Comment: Performed at [facility], [address], [place], [place] [address]  CBC     Status: Abnormal   Collection Time: 02/23/23 12:56 PM  Result Value Ref Range   WBC 6.8 4.0 - 10.5 K/uL   RBC 5.80 (H) 3.87 - 5.11 MIL/uL   Hemoglobin 12.9 12.0 - 15.0 g/dL   HCT 95.6 21.3 - 08.6 %   MCV 77.6 (L) 80.0 - 100.0 fL   MCH 22.2 (L) 26.0 - 34.0 pg   MCHC 28.7 (L) 30.0 - 36.0 g/dL   RDW 57.8 46.9 - 62.9 %   Platelets 154 150 - 400 K/uL   nRBC 0.0 0.0 - 0.2 %    Comment: Performed at [facility], [address], [place], [place] [address]  Troponin I (High Sensitivity)     Status: None   Collection Time: 02/23/23  2:26 PM  Result Value Ref Range   Troponin I (High Sensitivity) 8 <18 ng/L    Comment: (NOTE) Elevated high sensitivity troponin I (hsTnI) values and significant  changes across serial measurements may suggest ACS but many other  chronic and acute conditions are known to elevate hsTnI results.  Refer to the "Links" section for chest pain algorithms and additional  guidance. Performed at [facility],  [address] [place], [place] [address]   Blood gas, venous (at [facility] and [facility])     Status: Abnormal   Collection Time: 02/23/23  2:26 PM  Result Value Ref Range   pH, Ven 7.23 (L) 7.25 - 7.43   pCO2, Ven 110 (HH) 44 - 60 mmHg    Comment: CRITICAL RESULT CALLED TO, READ BACK BY AND VERIFIED WITH: RN T  AT 1441 02/23/23 CRUICKSHANK A    pO2, Ven 71 (  H) 32 - 45 mmHg   Bicarbonate 46.1 (H) 20.0 - 28.0 mmol/L   Acid-Base Excess 14.2 (H) 0.0 - 2.0 mmol/L   O2 Saturation 95.5 %   Patient temperature 37.0     Comment: Performed at [facility], [address] [place], [place] [address]   DG Chest Portable 1 View  Result Date: 02/23/2023 CLINICAL DATA:  Respiratory failure EXAM: PORTABLE CHEST 1 VIEW COMPARISON:  Chest radiograph 08/16/2018 FINDINGS: A cardiac loop recorder is again right. The cardiomediastinal silhouette is normal. There is no focal consolidation or pulmonary edema. There is no pleural effusion or pneumothorax There is no acute osseous abnormality. IMPRESSION: Stable chest with no radiographic evidence of acute cardiopulmonary process. Electronically Signed   By: Lesia Hausen M.D.   On: 02/23/2023 13:42    Pending Labs Unresulted Labs (From admission, onward)     Start     Ordered   02/24/23 0500  Comprehensive metabolic panel  Tomorrow morning,   R        02/23/23 1508   02/24/23 0500  CBC  Tomorrow morning,   R        02/23/23 1508   02/23/23 1251  SARS Coronavirus 2 by RT PCR (hospital order, performed in [facility] hospital lab) *cepheid single result test* Anterior Nasal Swab  (Tier 2 - SARS Coronavirus 2 by RT PCR (hospital order, performed in [facility] hospital lab) *cepheid single result test*)  Once,   URGENT        02/23/23 1251            Vitals/Pain Today's Vitals   02/23/23 1240 02/23/23 1345 02/23/23 1459 02/23/23 1514  BP:  137/71    Pulse: 83 68 85   Resp:  16 20   Temp:  (!) 97.4 F (36.3 C)    SpO2: 92% 100% 100% 97%   Weight:      Height:      PainSc:        Isolation Precautions Airborne and Contact precautions  Medications Medications  aspirin EC tablet 81 mg (has no administration in time range)  atorvastatin (LIPITOR) tablet 40 mg (has no administration in time range)  furosemide (LASIX) tablet 40 mg (has no administration in time range)  calcitRIOL (ROCALTROL) capsule 0.25 mcg (has no administration in time range)  docusate sodium (COLACE) capsule 100 mg (has no administration in time range)  pantoprazole (PROTONIX) EC tablet 40 mg (has no administration in time range)  montelukast (SINGULAIR) tablet 10 mg (has no administration in time range)  loratadine (CLARITIN) tablet 10 mg (has no administration in time range)  ipratropium-albuterol (DUONEB) 0.5-2.5 (3) MG/3ML nebulizer solution 3 mL (has no administration in time range)  acetaminophen (TYLENOL) tablet 650 mg (has no administration in time range)    Or  acetaminophen (TYLENOL) suppository 650 mg (has no administration in time range)  traZODone (DESYREL) tablet 25 mg (has no administration in time range)  polyethylene glycol (MIRALAX / GLYCOLAX) packet 17 g (has no administration in time range)  ondansetron (ZOFRAN) tablet 4 mg (has no administration in time range)    Or  ondansetron (ZOFRAN) injection 4 mg (has no administration in time range)  albuterol (PROVENTIL) (2.5 MG/3ML) 0.083% nebulizer solution 2.5 mg (has no administration in time range)  methylPREDNISolone sodium succinate (SOLU-MEDROL) 40 mg/mL injection 40 mg (has no administration in time range)  doxycycline (VIBRAMYCIN) 100 mg in sodium chloride 0.9 % 250 mL IVPB (has no administration in time  range)  ipratropium-albuterol (DUONEB) 0.5-2.5 (3) MG/3ML nebulizer solution (  Not Given 02/23/23 1516)  enoxaparin (LOVENOX) injection 60 mg (has no administration in time range)  albuterol (PROVENTIL) (2.5 MG/3ML) 0.083% nebulizer solution (10 mg/hr Nebulization Given 02/23/23  1314)  magnesium sulfate IVPB 2 g 50 mL (0 g Intravenous Stopped 02/23/23 1405)  methylPREDNISolone sodium succinate (SOLU-MEDROL) 125 mg/2 mL injection 125 mg (125 mg Intravenous Given 02/23/23 1306)    Mobility walks with device     Focused Assessments Neuro Assessment Handoff:  Swallow screen pass?  N/A         Neuro Assessment:   Neuro Checks:      Has TPA been given? No If patient is a Neuro Trauma and patient is going to OR before floor call report to 4N Charge nurse: [phone] or [phone]   R Recommendations: See Admitting Provider Note  Report given to:   Additional Notes:

## 2023-02-23 NOTE — H&P (Signed)
History and Physical  Pamela [name] ZOX:[id number] DOB: 06-07-1951 DOA: 02/23/2023  PCP: Massie Maroon, FNP   Chief Complaint: Shortness of breath  HPI: Pamela [name] is a 72 y.o. female with medical history significant for OPD chronically on 2 L nasal cannula oxygen, heart failure with preserved EF, hypertension, morbid obesity, sarcoidosis, lung cancer adenocarcinoma in 2012 in remission being admitted to the hospital with 4 days of hypoxic respiratory failure, and dyspnea due to exacerbation of COPD.  Patient states that for the last 3 to 4 days, she has been getting more short of breath with exertion, has intermittent cough productive of clear sputum, and has had to turn her oxygen from baseline of 2 L, to 4 L continuous.  Is any chest pains, fevers, nausea, vomiting, abdominal pain.  She says that she has bilateral lower extremity edema right greater than left, which is currently stable.  Denies any other complaints.  Contacted her pulmonologist Dr. Sherene Sires, who told her to come to the ER where she was noted initially to saturate 77% on 4 L.  ED Course: In the emergency department, she has been afebrile, blood pressure 157/105, saturating 73% on 4 L.  This improved to 100% with breathing treatments.  She was given IV magnesium, IV Solu-Medrol, breathing treatments and her breathing is now improved.  Venous blood gas 7.23, pCO2 110, pO2 71.  Lab work reveals stable CKD 1.47, magnesium 1.6, unremarkable CBC.  COVID swab is pending.  Review of Systems: Please see HPI for pertinent positives and negatives. A complete 10 system review of systems are otherwise negative.  Past Medical History:  Diagnosis Date   Arthritis    Asthma    Cancer (HCC)    lung, adenocarcinoma right lung 2012   CHF (congestive heart failure) (HCC)    Preserved EF   Congenital single kidney    With chronic kidney disease   COPD (chronic obstructive pulmonary disease) (HCC)    Gout    Hypertension    Lung cancer  (HCC) 2012   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 Patient notes it was consistent with sarcoidosis but actual pathology results not available.   Oxygen deficiency    Sarcoidosis    Sickle cell trait (HCC)    Sleep apnea    Past Surgical History:  Procedure Laterality Date   LOOP RECORDER INSERTION N/A 09/08/2018   Procedure: LOOP RECORDER INSERTION;  Surgeon: Marinus Maw, MD;  Location: [facility] INVASIVE [facility];  Service: Cardiovascular;  Laterality: N/A;   LUNG BIOPSY     TEE WITHOUT CARDIOVERSION N/A 09/08/2018   Procedure: TRANSESOPHAGEAL ECHOCARDIOGRAM (TEE);  Surgeon: Wendall Stade, MD;  Location: [facility] ENDOSCOPY;  Service: Cardiovascular;  Laterality: N/A;  with loop   TUBAL LIGATION     VEIN LIGATION AND STRIPPING     Social History:  reports that she has never smoked. She has never used smokeless tobacco. She reports that she does not drink alcohol and does not use drugs.   Allergies  Allergen Reactions   Sulfa Antibiotics Hives and Rash    Family History  Problem Relation Age of Onset   Hypertension Mother    Renal Disease Mother    High blood pressure Mother    Heart disease Mother    Cancer Father  stomach   Heart disease Father        No details   Cervical cancer Sister    Diabetes Sister    Multiple myeloma Sister    Cancer Brother    Diabetes Brother      Prior to Admission medications   Medication Sig Start Date End Date Taking? Authorizing Provider  acetaminophen (TYLENOL) 500 MG tablet Take 1,000 mg by mouth every 6 (six) hours as needed for mild pain.    [provider]  albuterol (PROAIR HFA) 108 (90 Base) MCG/ACT inhaler Inhale 2 puffs into the lungs every 4 (four) hours as needed for wheezing or shortness of breath. 01/01/23   Charlott Holler, MD  albuterol  (PROVENTIL) (2.5 MG/3ML) 0.083% nebulizer solution USE 1 VIAL IN NEBULIZER EVERY 6 HOURS AS NEEDED FOR WHEEZING FOR SHORTNESS OF BREATH 01/01/23   Charlott Holler, MD  aspirin EC 81 MG tablet Take 1 tablet (81 mg total) by mouth daily. Swallow whole. 04/26/20   Chandrasekhar, [place] Dragon A, MD  atorvastatin (LIPITOR) 40 MG tablet Take 1 tablet (40 mg total) by mouth daily. 01/12/23 04/12/23  Swinyer, Zachary George, NP  budesonide (PULMICORT) 0.25 MG/2ML nebulizer solution One twice daily with albuterol 10/08/22   Nyoka Cowden, MD  calcitRIOL (ROCALTROL) 0.25 MCG capsule Take 1 capsule (0.25 mcg total) by mouth daily. 12/29/22   Massie Maroon, FNP  cetirizine (ZYRTEC) 10 MG chewable tablet Chew 10 mg by mouth daily.    [provider]  docusate sodium (STOOL SOFTENER) 100 MG capsule Take 100 mg by mouth daily.    [provider]  famotidine (PEPCID) 20 MG tablet One after supper Patient taking differently: Take 20 mg by mouth every evening. One after supper.  Takes PRN. 08/27/22   Nyoka Cowden, MD  febuxostat (ULORIC) 40 MG tablet Take 1 tablet (40 mg total) by mouth daily. 11/03/22   Massie Maroon, FNP  ferrous sulfate 325 (65 FE) MG tablet Take 1 tablet (325 mg total) by mouth daily with lunch. 08/25/22 01/26/23  Amin, Loura Halt, MD  furosemide (LASIX) 40 MG tablet Take 1 tablet (40 mg total) by mouth daily. 12/29/22   Massie Maroon, FNP  Incontinence Supply Disposable (DEPEND UNDERWEAR LARGE/XL) MISC 1 each by Does not apply route 2 times daily at 12 noon and 4 pm. 07/28/22   Massie Maroon, FNP  montelukast (SINGULAIR) 10 MG tablet Take 1 tablet (10 mg total) by mouth at bedtime. 11/03/22   Nyoka Cowden, MD  pantoprazole (PROTONIX) 40 MG tablet TAKE 1 TABLET EVERY DAY 30 TO 60 MINUTES BEFORE FIRST MEAL OF THE DAY 11/13/22   Nyoka Cowden, MD    Physical Exam: BP 137/71   Pulse 85   Temp (!) 97.4 F (36.3 C)   Resp 20   Ht 5\' 4"  (1.626 m)   Wt (!) 142.8 kg   SpO2 100%    BMI 54.04 kg/m   General:  Alert, oriented, calm, in no acute distress speaking in full sentences, no cough, no dyspnea currently wearing 4 L nasal cannula oxygen Eyes: EOMI, clear conjuctivae, white sclerea Neck: supple, no masses, trachea mildline  Cardiovascular: RRR, no murmurs or rubs Respiratory: He has been admitted to bilateral breath sounds, but good bilateral and equal air entry, with some mild diffuse rhonchi, mild end expiratory wheezing. Abdomen: soft, nontender, nondistended, normal bowel tones heard, obese Skin: dry, no rashes  Musculoskeletal: no joint effusions, normal range of  motion, there is nonpitting bilateral lower extremity 2-3+ edema, slightly worse on the right.  Patient is confident that this is chronic. Psychiatric: appropriate affect, normal speech  Neurologic: extraocular muscles intact, clear speech, moving all extremities with intact sensorium          Labs on Admission:  Basic Metabolic Panel: Recent Labs  Lab 02/23/23 1256  NA 143  K 4.4  CL 93*  CO2 39*  GLUCOSE 103*  BUN 32*  CREATININE 1.47*  CALCIUM 9.3  MG 1.6*   Liver Function Tests: No results for input(s): "AST", "ALT", "ALKPHOS", "BILITOT", "PROT", "ALBUMIN" in the last 168 hours. No results for input(s): "LIPASE", "AMYLASE" in the last 168 hours. No results for input(s): "AMMONIA" in the last 168 hours. CBC: Recent Labs  Lab 02/23/23 1256  WBC 6.8  HGB 12.9  HCT 45.0  MCV 77.6*  PLT 154   Cardiac Enzymes: No results for input(s): "CKTOTAL", "CKMB", "CKMBINDEX", "TROPONINI" in the last 168 hours.  BNP (last 3 results) Recent Labs    08/24/22 0437 09/24/22 0857 02/23/23 1256  BNP 21.1 79.5 23.4    ProBNP (last 3 results) No results for input(s): "PROBNP" in the last 8760 hours.  CBG: No results for input(s): "GLUCAP" in the last 168 hours.  Radiological Exams on Admission: DG Chest Portable 1 View  Result Date: 02/23/2023 CLINICAL DATA:  Respiratory failure  EXAM: PORTABLE CHEST 1 VIEW COMPARISON:  Chest radiograph 08/16/2018 FINDINGS: A cardiac loop recorder is again right. The cardiomediastinal silhouette is normal. There is no focal consolidation or pulmonary edema. There is no pleural effusion or pneumothorax There is no acute osseous abnormality. IMPRESSION: Stable chest with no radiographic evidence of acute cardiopulmonary process. Electronically Signed   By: Lesia Hausen M.D.   On: 02/23/2023 13:42    Assessment/Plan Kimmerly Mctague is a 72 y.o. female with medical history significant for OPD chronically on 2 L nasal cannula oxygen, heart failure with preserved EF, hypertension, morbid obesity, sarcoidosis, lung cancer adenocarcinoma in 2012 in remission being admitted to the hospital with 4 days of hypoxic respiratory failure, and dyspnea due to acute exacerbation of COPD.    Acute exacerbation of COPD-suspected due to increased cough, dyspnea, hypoxia, and degree of uncompensated respiratory acidosis.  Chest x-ray with no acute abnormality, no fevers or cytosis, so low suspicion for acute bacterial infection. -Inpatient admission to stepdown -Scheduled DuoNebs, as needed albuterol inhaler -Given IV Solu-Medrol 125 mg in the ER, will continue with IV Solu-Medrol 40 mg IV every 12 hours -Incentive spirometry, and flutter valve -Claritin 10 mg p.o. daily along with Singulair -Given severity of her COPD, will treat empirically with IV doxycycline 100 mg IV every 12 hours  Respiratory acidosis due to acute on what is presumed to be chronic hypercarbia -Patient was placed on BiPAP in the ER, she is mentating clearly -Will continue BiPAP for a couple more hours, then wean as tolerated and plan to resume it in case of increased work of breathing, or evidence of worsening hypercarbia  CKD stage III-with history of solitary congenital kidney, renal function appears to be at baseline  GERD-Protonix p.o.  Diastolic congestive heart failure-with chronic  edema, but no evidence of acute exacerbation.  Note normal BNP. -Continue Lasix 40 mg p.o. daily  Hyperlipidemia-Lipitor  Prediabetes-note hemoglobin A1c earlier this year less than 6, simply monitor glucose for now with morning labs especially in the setting of high-dose IV steroids.  Morbid obesity-BMI 55.04, complicating all aspects of care  DVT prophylaxis: Lovenox     Code Status: Full Code  Consults called: None  Admission status: The appropriate patient status for this patient is INPATIENT. Inpatient status is judged to be reasonable and necessary in order to provide the required intensity of service to ensure the patient's safety. The patient's presenting symptoms, physical exam findings, and initial radiographic and laboratory data in the context of their chronic comorbidities is felt to place them at high risk for further clinical deterioration. Furthermore, it is not anticipated that the patient will be medically stable for discharge from the hospital within 2 midnights of admission.    I certify that at the point of admission it is my clinical judgment that the patient will require inpatient hospital care spanning beyond 2 midnights from the point of admission due to high intensity of service, high risk for further deterioration and high frequency of surveillance required  Time spent: 70 minutes   Sharlette Dense MD [facility] Pager [phone]  If 7PM-7AM, please contact night-coverage [email or web address] Password [email or web address]  02/23/2023, 3:10 PM

## 2023-02-23 NOTE — Plan of Care (Signed)

## 2023-02-23 NOTE — Progress Notes (Signed)
SBP reading high on monitor. Manual BP completed and was 142/80.

## 2023-02-24 DIAGNOSIS — J4541 Moderate persistent asthma with (acute) exacerbation: Secondary | ICD-10-CM

## 2023-02-24 DIAGNOSIS — J9602 Acute respiratory failure with hypercapnia: Secondary | ICD-10-CM

## 2023-02-24 DIAGNOSIS — J9621 Acute and chronic respiratory failure with hypoxia: Secondary | ICD-10-CM

## 2023-02-24 DIAGNOSIS — J9612 Chronic respiratory failure with hypercapnia: Secondary | ICD-10-CM | POA: Diagnosis not present

## 2023-02-24 DIAGNOSIS — J398 Other specified diseases of upper respiratory tract: Secondary | ICD-10-CM

## 2023-02-24 LAB — BLOOD GAS, ARTERIAL
Acid-Base Excess: 13.2 mmol/L — ABNORMAL HIGH (ref 0.0–2.0)
Bicarbonate: 45.2 mmol/L — ABNORMAL HIGH (ref 20.0–28.0)
Drawn by: 560031
O2 Content: 6 L/min
O2 Saturation: 99.8 %
Patient temperature: 37
pCO2 arterial: 103 mmHg (ref 32–48)
pH, Arterial: 7.25 — ABNORMAL LOW (ref 7.35–7.45)
pO2, Arterial: 102 mmHg (ref 83–108)

## 2023-02-24 LAB — GLUCOSE, CAPILLARY: Glucose-Capillary: 110 mg/dL — ABNORMAL HIGH (ref 70–99)

## 2023-02-24 LAB — RESPIRATORY PANEL BY PCR

## 2023-02-24 LAB — PHOSPHORUS: Phosphorus: 4.6 mg/dL (ref 2.5–4.6)

## 2023-02-24 LAB — MAGNESIUM: Magnesium: 2.1 mg/dL (ref 1.7–2.4)

## 2023-02-24 MED ORDER — BUDESONIDE 0.5 MG/2ML IN SUSP
0.5000 mg | Freq: Two times a day (BID) | RESPIRATORY_TRACT | Status: DC
  Administered 2023-02-24 – 2023-03-03 (×14): 0.5 mg via RESPIRATORY_TRACT
  Filled 2023-02-24 (×14): qty 2

## 2023-02-24 MED ORDER — FUROSEMIDE 10 MG/ML IJ SOLN: 20.0000 mg | Freq: Two times a day (BID) | INTRAMUSCULAR | Status: DC

## 2023-02-24 MED ORDER — REVEFENACIN 175 MCG/3ML IN SOLN
175.0000 ug | Freq: Every day | RESPIRATORY_TRACT | Status: DC
  Administered 2023-02-25 – 2023-02-26 (×2): 175 ug via RESPIRATORY_TRACT
  Filled 2023-02-24 (×2): qty 3

## 2023-02-24 MED ORDER — FLUTICASONE FUROATE-VILANTEROL 200-25 MCG/ACT IN AEPB
1.0000 | INHALATION_SPRAY | Freq: Every day | RESPIRATORY_TRACT | Status: DC
  Administered 2023-02-24: 1 via RESPIRATORY_TRACT
  Filled 2023-02-24: qty 28

## 2023-02-24 MED ORDER — GUAIFENESIN ER 600 MG PO TB12
600.0000 mg | ORAL_TABLET | Freq: Two times a day (BID) | ORAL | Status: DC
  Administered 2023-02-24 – 2023-03-03 (×15): 600 mg via ORAL
  Filled 2023-02-24 (×15): qty 1

## 2023-02-24 MED ORDER — HYDROCOD POLI-CHLORPHE POLI ER 10-8 MG/5ML PO SUER
5.0000 mL | Freq: Two times a day (BID) | ORAL | Status: DC | PRN
  Administered 2023-02-24 – 2023-03-01 (×4): 5 mL via ORAL
  Filled 2023-02-24 (×3): qty 5

## 2023-02-24 MED ORDER — FUROSEMIDE 10 MG/ML IJ SOLN
40.0000 mg | Freq: Two times a day (BID) | INTRAMUSCULAR | Status: DC
  Administered 2023-02-24 – 2023-02-25 (×2): 40 mg via INTRAVENOUS
  Filled 2023-02-24 (×2): qty 4

## 2023-02-24 MED ORDER — ARFORMOTEROL TARTRATE 15 MCG/2ML IN NEBU
15.0000 ug | INHALATION_SOLUTION | Freq: Two times a day (BID) | RESPIRATORY_TRACT | Status: DC
  Administered 2023-02-24 – 2023-02-26 (×4): 15 ug via RESPIRATORY_TRACT
  Filled 2023-02-24 (×4): qty 2

## 2023-02-24 NOTE — Progress Notes (Signed)
PT Cancellation Note  Patient Details Name: Pamela [name] MRN: [id number] DOB: Dec 22, 1950   Cancelled Treatment:    Reason Eval/Treat Not Completed: Fatigue/lethargy limiting ability to participate (per RN, pt just got back to bed after being up in the chair and needs to rest right now. Will follow.)  [name] Ser PT 02/24/2023  Acute Rehabilitation Services  Office [phone]

## 2023-02-24 NOTE — Plan of Care (Signed)
  Problem: Clinical Measurements: Goal: Will remain free from infection Outcome: Progressing Goal: Diagnostic test results will improve Outcome: Progressing Goal: Respiratory complications will improve Outcome: Progressing Goal: Cardiovascular complication will be avoided Outcome: Progressing   Problem: Activity: Goal: Risk for activity intolerance will decrease Outcome: Progressing   

## 2023-02-24 NOTE — Plan of Care (Signed)
  Problem: Education: Goal: Knowledge of General Education information will improve Description: Including pain rating scale, medication(s)/side effects and non-pharmacologic comfort measures Outcome: Progressing   Problem: Clinical Measurements: Goal: Ability to maintain clinical measurements within normal limits will improve Outcome: Progressing Goal: Will remain free from infection Outcome: Progressing Goal: Cardiovascular complication will be avoided Outcome: Progressing   Problem: Clinical Measurements: Goal: Will remain free from infection Outcome: Progressing   Problem: Nutrition: Goal: Adequate nutrition will be maintained Outcome: Progressing

## 2023-02-24 NOTE — Progress Notes (Signed)
   02/24/23 2236  BiPAP/CPAP/SIPAP  BiPAP/CPAP/SIPAP Pt Type Adult  BiPAP/CPAP/SIPAP V60  Mask Type Full face mask  Mask Size Medium  Set Rate 14 breaths/min  Respiratory Rate 16 breaths/min  IPAP 22 cmH20  EPAP 8 cmH2O  FiO2 (%) 35 %  Minute Ventilation 13.6  Leak 10  Peak Inspiratory Pressure (PIP) 22  Tidal Volume (Vt) 915  Patient Home Equipment No  Auto Titrate No  Press High Alarm 30 cmH2O  Press Low Alarm 5 cmH2O  CPAP/SIPAP surface wiped down Yes  Oxygen Percent 35 %  BiPAP/CPAP /SiPAP Vitals  Bilateral Breath Sounds Diminished;Expiratory wheezes

## 2023-02-24 NOTE — Progress Notes (Signed)
[facility] Progress Note  Patient: Pamela [name]    QVZ:[id number]  DOA: 02/23/2023     Date of Service: the patient was seen and examined on 02/24/2023  Chief Complaint  Patient presents with   Shortness of Breath   Brief hospital course: Jennetta Freshour is a 72 y.o. female with PMH of  COPD on 2L ox via [place], heart failure with preserved EF, HTN, morbid obesity, sarcoidosis, lung cancer adenocarcinoma in 2012 in remission as reviewed from EMR, presented at [facility] long ED due to 4 days of hypoxic respiratory failure, and dyspnea due to exacerbation of COPD.  Patient states that for the last 3 to 4 days, she has been getting more short of breath with exertion, has intermittent cough productive of clear sputum, and has had to turn her oxygen from baseline of 2 L, to 4 L continuous.  Is any chest pains, fevers, nausea, vomiting, abdominal pain.  She says that she has bilateral lower extremity edema right greater than left, which is currently stable.  Denies any other complaints.  Contacted her pulmonologist Dr. Sherene Sires, who told her to come to the ER where she was noted initially to saturate 77% on 4 L.   ED Course: afebrile, blood pressure 157/105, saturating 73% on 4 L.  This improved to 100% with breathing treatments.  She was given IV magnesium, IV Solu-Medrol, breathing treatments and her breathing is now improved.  Venous blood gas 7.23, pCO2 110, pO2 71.  Lab work reveals stable CKD 1.47, magnesium 1.6, unremarkable CBC.  COVID PCR negative    Assessment and Plan:  Acute on chronic hypoxic and hypercapnic respiratory failure due to COPD exacerbation Chest x-ray with no acute abnormality, no fevers or cytosis, so low suspicion for acute bacterial infection.  Continue DuoNeb every 6 hourly Empiric antibiotics doxycycline 100 mg IV every 12 hourly Solu-Medrol 40 mg IV every 12 hours PPI for GI prophylaxis Started Breo Ellipta inhaler Mucinex 600 mg p.o. twice daily, Tussionex as needed for  cough Continue incentive spirometry and flutter valve Continue BiPAP as needed, patient was unable to tolerate.  Encouraged to use the BiPAP Follow pulmonary consult for further recommendation Follow respiratory viral panel  CKD stage III-with history of solitary congenital kidney, renal function appears to be at baseline   GERD-Protonix p.o.   Diastolic congestive heart failure-with chronic edema, but no evidence of acute exacerbation.  Note normal BNP. -on Lasix 40 mg p.o. daily 8/7 changed Lasix to 20 mg IV twice daily  Hyperlipidemia-Lipitor   Prediabetes-note hemoglobin A1c earlier this year less than 6, simply monitor glucose for now with morning labs especially in the setting of high-dose IV steroids.   Morbid obesity-BMI 55.04, complicating all aspects of care    Body mass index is 54.04 kg/m.  Interventions:     Diet: Heart healthy diet, fluid striction 1.52/day DVT Prophylaxis: Subcutaneous Lovenox   Advance goals of care discussion: Full code  Family Communication: family was not  present at bedside, at the time of interview.  The pt provided permission to discuss medical plan with the family. Opportunity was given to ask question and all questions were answered satisfactorily.   Disposition:  Pt is from Home, admitted with acute on chronic respiratory failure requiring BiPAP, still has significant respiratory failure, which precludes a safe discharge. Discharge to home, when stable.  Subjective: No significant overnight events, patient feels improvement in the shortness of breath but is still has significant wheezing and cough with phlegm production which  is also decreasing.  Denied any chest pain or palpitations.  No any other active issues.  Physical Exam: General: NAD, lying comfortably Appear in no distress, affect appropriate Eyes: PERRLA ENT: Oral Mucosa Clear, moist  Neck: no JVD,  Cardiovascular: S1 and S2 Present, no Murmur,  Respiratory: good  respiratory effort, bilateral wheezing, mild basilar crackles. Abdomen: Bowel Sound present, Soft and no tenderness,  Skin: no rashes Extremities: 3-4+ pedal edema, no calf tenderness Neurologic: without any new focal findings Gait not checked due to patient safety concerns  Vitals:   02/24/23 1000 02/24/23 1100 02/24/23 1134 02/24/23 1200  BP: 112/73 (!) 133/51  (!) 141/74  Pulse: 72 77 75 77  Resp: (!) 25 (!) 24 20 (!) 26  Temp:    99.2 F (37.3 C)  TempSrc:    Oral  SpO2: 97% 94% 97% 95%  Weight:      Height:        Intake/Output Summary (Last 24 hours) at 02/24/2023 1337 Last data filed at 02/24/2023 0981 Gross per 24 hour  Intake 738.92 ml  Output --  Net 738.92 ml   Filed Weights   02/23/23 1237  Weight: (!) 142.8 kg    Data Reviewed: I have personally reviewed and interpreted daily labs, tele strips, imagings as discussed above. I reviewed all nursing notes, pharmacy notes, vitals, pertinent old records I have discussed plan of care as described above with RN and patient/family.  CBC: Recent Labs  Lab 02/23/23 1256 02/24/23 0305  WBC 6.8 4.4  HGB 12.9 12.1  HCT 45.0 43.7  MCV 77.6* 78.3*  PLT 154 139*   Basic Metabolic Panel: Recent Labs  Lab 02/23/23 1256 02/24/23 0305 02/24/23 0448  NA 143 141  --   K 4.4 5.0  --   CL 93* 94*  --   CO2 39* 39*  --   GLUCOSE 103* 156*  --   BUN 32* 30*  --   CREATININE 1.47* 1.23*  --   CALCIUM 9.3 8.9  --   MG 1.6* 2.1  --   PHOS  --   --  4.6    Studies: No results found.  Scheduled Meds:  aspirin EC  81 mg Oral Daily   atorvastatin  40 mg Oral Daily   calcitRIOL  0.25 mcg Oral Daily   Chlorhexidine Gluconate Cloth  6 each Topical Daily   docusate sodium  100 mg Oral Daily   enoxaparin (LOVENOX) injection  70 mg Subcutaneous Q24H   fluticasone furoate-vilanterol  1 puff Inhalation Daily   furosemide  40 mg Oral Daily   guaiFENesin  600 mg Oral BID   ipratropium-albuterol  3 mL Nebulization QID    loratadine  10 mg Oral Daily   methylPREDNISolone (SOLU-MEDROL) injection  40 mg Intravenous Q12H   montelukast  10 mg Oral QHS   mouth rinse  15 mL Mouth Rinse 4 times per day   pantoprazole  40 mg Oral Daily   Continuous Infusions:  doxycycline (VIBRAMYCIN) IV Stopped (02/24/23 0543)   PRN Meds: acetaminophen **OR** acetaminophen, albuterol, chlorpheniramine-HYDROcodone, ondansetron **OR** ondansetron (ZOFRAN) IV, mouth rinse, polyethylene glycol, traZODone  Time spent: 55 minutes  Author: Gillis Santa. MD [facility] 02/24/2023 1:37 PM  To reach On-call, see care teams to locate the attending and reach out to them via [email or web address]. If 7PM-7AM, please contact night-coverage If you still have difficulty reaching the attending provider, please page the [facility] (Director on Call) for [facility] on amion for assistance.

## 2023-02-24 NOTE — Consult Note (Signed)
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 Sherene Sires  was admitted to [facility] 8/6 with SOB. Worsened 2-4d prior to admission-- started to notice she was desatting at home and was hard to recover + had worse SOB. Did not feel like her BLE edema was worse than baseline (R>L also chronic). In ED She required a slight incr of her baseline O2, up to 4L and was admitted for AECOPD   8/7 PCCM is consulted in this setting   Has been seen outpt a couple of times for her wheezing -- once as an acute visit w Dr Celine Mans with concern for wheezing r/t her tracheomalacia. Follow up w primary pulmonologist Dr. Sherene Sires in July where concern for VCD driving wheeze was raised and a referral for ENT was placed ( scheduled 8/28)   Pertinent  Medical History   Asthma Tracheomalacia Obesity CKD IIIb solitary kidney   Significant Hospital Events: Including procedures, antibiotic start and stop dates in addition to other pertinent events   8/6 admit to [facility] for AECOPD 8/7 back down to home O2. PCCM consulted   Interim History / Subjective:   Consulted  Objective   Blood pressure (!) 141/74, pulse 77, temperature 99.2 F (37.3 C), temperature source Oral, resp. rate (!) 26, height 5\' 4"  (1.626 m), weight (!) 142.8 kg, SpO2 100%.    FiO2 (%):  [35 %] 35 %   Intake/Output Summary (Last 24 hours) at 02/24/2023 1534 Last data filed at 02/24/2023 0523 Gross per 24 hour  Intake 697.43 ml  Output --  Net 697.43 ml   Filed Weights   02/23/23 1237  Weight: (!) 142.8 kg    Examination: General: chronically ill F  HENT: NCAT pink mmm  Lungs: Diminished basilar sounds. Scattered crackles and a  faint wheeze  Cardiovascular: rrr  Abdomen: Obese soft ndnt Extremities: R>L edema. 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 Inpt is also on duoneb, albuterol -monteleukast, claritin  -short course empiric abx is fine  -IS, mobility  -has ENT referral, 8/28  CKDIIIb HFpEF HLD  HTN -giving lasix as above  -following renal indices, UOP -statin   Best Practice (right click and "Reselect all SmartList Selections" daily)   Per primary   Labs   CBC: Recent Labs  Lab 02/23/23 1256 02/24/23 0305  WBC 6.8 4.4  HGB 12.9 12.1  HCT 45.0 43.7  MCV 77.6* 78.3*  PLT 154 139*    Basic Metabolic Panel: Recent Labs  Lab 02/23/23 1256 02/24/23 0305 02/24/23 0448  NA 143 141  --   K 4.4 5.0  --   CL 93* 94*  --   CO2 39* 39*  --   GLUCOSE 103* 156*  --   BUN 32* 30*  --  CREATININE 1.47* 1.23*  --   CALCIUM 9.3 8.9  --   MG 1.6* 2.1  --   PHOS  --   --  4.6   GFR: Estimated Creatinine Clearance: 60.4 mL/min (A) (by C-G formula based on SCr of 1.23 mg/dL (H)). Recent Labs  Lab 02/23/23 1256 02/24/23 0305  WBC 6.8 4.4    Liver Function Tests: Recent Labs  Lab 02/24/23 0305  AST 19  ALT 13  ALKPHOS 118  BILITOT 0.5  PROT 7.0  ALBUMIN 3.3*   No results for input(s): "LIPASE", "AMYLASE" in the last 168 hours. No results for input(s): "AMMONIA" in the last 168 hours.  ABG    Component Value Date/Time   PHART 7.25 (L) 02/24/2023 0803   PCO2ART 103 (HH) 02/24/2023 0803   PO2ART 102 02/24/2023 0803   HCO3 45.2 (H) 02/24/2023 0803   TCO2 30 09/13/2018 0843   O2SAT 99.8  02/24/2023 0803     Coagulation Profile: No results for input(s): "INR", "PROTIME" in the last 168 hours.  Cardiac Enzymes: No results for input(s): "CKTOTAL", "CKMB", "CKMBINDEX", "TROPONINI" in the last 168 hours.  HbA1C: HbA1c, POC (prediabetic range)  Date/Time Value Ref Range Status  10/21/2021 11:52 AM 5.5 (A) 5.7 - 6.4 % Final  09/11/2021 09:48 AM 5.8 5.7 - 6.4 % Final   HbA1c, POC (controlled diabetic range)  Date/Time Value Ref Range Status  10/21/2021 11:52 AM 5.5 0.0 - 7.0 % Final  09/11/2021 09:48 AM 5.8 0.0 - 7.0 % Final   HbA1c POC (<> result, manual entry)  Date/Time Value Ref Range Status  10/21/2021 11:52 AM 5.5 4.0 - 5.6 % Final  09/11/2021 09:48 AM 5.8 4.0 - 5.6 % Final   Hgb A1c MFr Bld  Date/Time Value Ref Range Status  09/24/2022 08:57 AM 5.6 4.8 - 5.6 % Final    Comment:             Prediabetes: 5.7 - 6.4          Diabetes: >6.4          Glycemic control for adults with diabetes: <7.0   08/17/2022 04:49 AM 5.4 4.8 - 5.6 % Final    Comment:    (NOTE) Pre diabetes:          5.7%-6.4%  Diabetes:              >6.4%  Glycemic control for   <7.0% adults with diabetes     CBG: Recent Labs  Lab 02/24/23 1212  GLUCAP 110*    Review of Systems:   Review of Systems  Constitutional: Negative.   HENT: Negative.    Eyes: Negative.   Respiratory:  Positive for shortness of breath and wheezing. Negative for hemoptysis and sputum production.   Cardiovascular: Negative.   Gastrointestinal: Negative.   Genitourinary: Negative.   Musculoskeletal:  Positive for back pain.  Skin: Negative.   Neurological: Negative.   Psychiatric/Behavioral: Negative.       Past Medical History:  She,  has a past medical history of Arthritis, Asthma, Cancer (HCC), CHF (congestive heart failure) (HCC), Congenital single kidney, COPD (chronic obstructive pulmonary disease) (HCC), Gout, Hypertension, Lung cancer (HCC) (2012), Mass of chest wall, right, Oxygen  deficiency, Sarcoidosis, Sickle cell trait (HCC), and Sleep apnea.   Surgical History:   Past Surgical History:  Procedure Laterality Date   LOOP RECORDER INSERTION N/A 09/08/2018   Procedure: LOOP RECORDER INSERTION;  Surgeon: Marinus Maw, MD;  Location: [facility] INVASIVE CV  LAB;  Service: Cardiovascular;  Laterality: N/A;   LUNG BIOPSY     TEE WITHOUT CARDIOVERSION N/A 09/08/2018   Procedure: TRANSESOPHAGEAL ECHOCARDIOGRAM (TEE);  Surgeon: Wendall Stade, MD;  Location: [facility] ENDOSCOPY;  Service: Cardiovascular;  Laterality: N/A;  with loop   TUBAL LIGATION     VEIN LIGATION AND STRIPPING       Social History:   reports that she has never smoked. She has never used smokeless tobacco. She reports that she does not drink alcohol and does not use drugs.   Family History:  Her family history includes Cancer in her brother and father; Cervical cancer in her sister; Diabetes in her brother and sister; Heart disease in her father and mother; High blood pressure in her mother; Hypertension in her mother; Multiple myeloma in her sister; Renal Disease in her mother.   Allergies Allergies  Allergen Reactions   Sulfa Antibiotics Hives and Rash     Home Medications  Prior to Admission medications   Medication Sig Start Date End Date Taking? Authorizing Provider  acetaminophen (TYLENOL) 500 MG tablet Take 1,000 mg by mouth every 6 (six) hours as needed for mild pain.   Yes [provider]  albuterol (PROAIR HFA) 108 (90 Base) MCG/ACT inhaler Inhale 2 puffs into the lungs every 4 (four) hours as needed for wheezing or shortness of breath. 01/01/23  Yes Charlott Holler, MD  albuterol (PROVENTIL) (2.5 MG/3ML) 0.083% nebulizer solution USE 1 VIAL IN NEBULIZER EVERY 6 HOURS AS NEEDED FOR WHEEZING FOR SHORTNESS OF BREATH 01/01/23  Yes Charlott Holler, MD  aspirin EC 81 MG tablet Take 1 tablet (81 mg total) by mouth daily. Swallow whole. 04/26/20  Yes Chandrasekhar, Mahesh A, MD  atorvastatin  (LIPITOR) 40 MG tablet Take 1 tablet (40 mg total) by mouth daily. 01/12/23 04/12/23 Yes Swinyer, Zachary George, NP  budesonide (PULMICORT) 0.25 MG/2ML nebulizer solution One twice daily with albuterol 10/08/22  Yes Nyoka Cowden, MD  calcitRIOL (ROCALTROL) 0.25 MCG capsule Take 1 capsule (0.25 mcg total) by mouth daily. 12/29/22  Yes Massie Maroon, FNP  cetirizine (ZYRTEC) 10 MG chewable tablet Chew 10 mg by mouth daily.   Yes [provider]  docusate sodium (STOOL SOFTENER) 100 MG capsule Take 100 mg by mouth daily.   Yes [provider]  famotidine (PEPCID) 20 MG tablet One after supper Patient taking differently: Take 20 mg by mouth every evening. One after supper.  Takes PRN. 08/27/22  Yes Nyoka Cowden, MD  febuxostat (ULORIC) 40 MG tablet Take 1 tablet (40 mg total) by mouth daily. 11/03/22  Yes Massie Maroon, FNP  ferrous sulfate 325 (65 FE) MG tablet Take 1 tablet (325 mg total) by mouth daily with lunch. 08/25/22 02/24/23 Yes Amin, Loura Halt, MD  furosemide (LASIX) 40 MG tablet Take 1 tablet (40 mg total) by mouth daily. 12/29/22  Yes Massie Maroon, FNP  montelukast (SINGULAIR) 10 MG tablet Take 1 tablet (10 mg total) by mouth at bedtime. 11/03/22  Yes Nyoka Cowden, MD  pantoprazole (PROTONIX) 40 MG tablet TAKE 1 TABLET EVERY DAY 30 TO 60 MINUTES BEFORE FIRST MEAL OF THE DAY Patient taking differently: Take 40 mg by mouth See admin instructions. TAKE 1 TABLET EVERY DAY 30 TO 60 MINUTES BEFORE FIRST MEAL OF THE DAY 11/13/22  Yes Nyoka Cowden, MD  Incontinence Supply Disposable (DEPEND UNDERWEAR LARGE/XL) MISC 1 each by Does not apply route 2 times daily at 12 noon and  4 pm. 07/28/22   Massie Maroon, FNP     Critical care time: na     Tessie Fass MSN, AGACNP-BC [facility] Pulmonary/Critical Care Medicine Amion for pager 02/24/2023, 3:34 PM

## 2023-02-24 NOTE — Progress Notes (Addendum)
Pt seen, watching tv, no respiratory distress / increased wob noted.  Bipap remains in room on standby.  Pt stated she was willing to try bipap again tonight but not ready at this time.  This Clinical research associate will return later to assist with bipap.  RN aware.

## 2023-02-24 NOTE — Progress Notes (Signed)
RT NOTE:  RT attempted to place pt on BiPAP, pt started coughing up mucus when placed on BiPAP and told RT "take it off I can't do this right now". RT took pt off BiPAP and placed pt on 3L [place]. RN made aware.

## 2023-02-25 ENCOUNTER — Inpatient Hospital Stay (HOSPITAL_COMMUNITY): Payer: Medicare PPO

## 2023-02-25 DIAGNOSIS — J4541 Moderate persistent asthma with (acute) exacerbation: Secondary | ICD-10-CM

## 2023-02-25 DIAGNOSIS — J45901 Unspecified asthma with (acute) exacerbation: Secondary | ICD-10-CM | POA: Diagnosis not present

## 2023-02-25 DIAGNOSIS — J9602 Acute respiratory failure with hypercapnia: Secondary | ICD-10-CM | POA: Diagnosis not present

## 2023-02-25 DIAGNOSIS — J9621 Acute and chronic respiratory failure with hypoxia: Secondary | ICD-10-CM

## 2023-02-25 DIAGNOSIS — J9622 Acute and chronic respiratory failure with hypercapnia: Secondary | ICD-10-CM

## 2023-02-25 DIAGNOSIS — N189 Chronic kidney disease, unspecified: Secondary | ICD-10-CM

## 2023-02-25 DIAGNOSIS — R609 Edema, unspecified: Secondary | ICD-10-CM | POA: Diagnosis not present

## 2023-02-25 DIAGNOSIS — J9612 Chronic respiratory failure with hypercapnia: Secondary | ICD-10-CM | POA: Diagnosis not present

## 2023-02-25 MED ORDER — FAMOTIDINE 20 MG PO TABS
20.0000 mg | ORAL_TABLET | Freq: Every day | ORAL | Status: DC
  Administered 2023-02-25 – 2023-03-02 (×6): 20 mg via ORAL
  Filled 2023-02-25 (×6): qty 1

## 2023-02-25 MED ORDER — FUROSEMIDE 10 MG/ML IJ SOLN: 40.0000 mg | Freq: Two times a day (BID) | INTRAMUSCULAR | Status: DC

## 2023-02-25 MED ORDER — METHYLPREDNISOLONE SODIUM SUCC 40 MG IJ SOLR
40.0000 mg | Freq: Every day | INTRAMUSCULAR | Status: DC
  Administered 2023-02-26 – 2023-02-27 (×2): 40 mg via INTRAVENOUS
  Filled 2023-02-25 (×2): qty 1

## 2023-02-25 MED ORDER — FUROSEMIDE 10 MG/ML IJ SOLN: 40.0000 mg | Freq: Every day | INTRAMUSCULAR | Status: DC

## 2023-02-25 MED ORDER — DOXYCYCLINE HYCLATE 100 MG PO TABS
100.0000 mg | ORAL_TABLET | Freq: Two times a day (BID) | ORAL | Status: AC
  Administered 2023-02-25 – 2023-03-01 (×9): 100 mg via ORAL
  Filled 2023-02-25 (×9): qty 1

## 2023-02-25 MED ORDER — FUROSEMIDE 10 MG/ML IJ SOLN
4.0000 mg/h | INTRAVENOUS | Status: DC
  Administered 2023-02-25: 4 mg/h via INTRAVENOUS
  Filled 2023-02-25: qty 20

## 2023-02-25 MED ORDER — MAGNESIUM SULFATE 4 GM/100ML IV SOLN
4.0000 g | Freq: Once | INTRAVENOUS | Status: AC
  Administered 2023-02-25: 4 g via INTRAVENOUS
  Filled 2023-02-25: qty 100

## 2023-02-25 NOTE — Plan of Care (Signed)

## 2023-02-25 NOTE — Progress Notes (Addendum)
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 Sherene Sires  was admitted to [facility] 8/6 with SOB. Worsened 2-4d prior to admission-- started to notice she was desatting at home and was hard to recover + had worse SOB. Did not feel like her BLE edema was worse than baseline (R>L also chronic). In ED She required a slight incr of her baseline O2, up to 4L and was admitted for AECOPD   8/7 PCCM is consulted in this setting   Has been seen outpt a couple of times for her wheezing -- once as an acute visit w Dr Celine Mans with concern for wheezing r/t her tracheomalacia. Follow up w primary pulmonologist Dr. Sherene Sires in July where concern for VCD driving wheeze was raised and a referral for ENT was placed ( scheduled 8/28)   Pertinent  Medical History   Asthma Tracheomalacia Obesity CKD IIIb solitary kidney   Significant Hospital Events: Including procedures, antibiotic start and stop dates in addition to other pertinent events   8/6 admit to [facility] for AECOPD 8/7 back down to home O2. PCCM consulted  8/8  replacing mag   Interim History / Subjective:  Was on BiPAP overnight  Breathing feels better this morning   Cr up a little to 1.49   UOP is not recorded, but has urine in canister so know she is making urine    Objective   Blood pressure (!) 105/58, pulse 70, temperature 98.6 F (37 C), temperature source Axillary, resp. rate 14, height 5\' 4"  (1.626 m), weight (!) 142.8 kg, SpO2 98%.    FiO2 (%):  [32 %-35 %] 32 %   Intake/Output Summary (Last 24 hours) at 02/25/2023 1037 Last data filed at 02/25/2023 0800 Gross per 24 hour  Intake 623.9 ml  Output --  Net 623.9 ml   Filed  Weights   02/23/23 1237  Weight: (!) 142.8 kg    Examination: General: Chronically ill morbidly obese F NAD in bed on BiPAP  HENT: BiPAP in place, lower dentures are lose and hanging in her mouth  Lungs: Diminished. Faint wheeze  Cardiovascular: regular rate, cap refill < 3 sec  Abdomen: obese soft  Extremities: edema  Neuro: drowsy, awoke to voice, following commands GU: purewick yellow urine   Resolved Hospital Problem list     Assessment & Plan:   Acute on chronic respiratory failure w hypoxia - improving Moderate persistent asthma Tracheomalacia ?VCD  P -will wean to every day steroids. Can probably change to PO tomorrow, will make sure she is ok today.  -cont lasix 40mg  BID for today, tomorrow de-escalate to 40qD  -Strict I/O  -yupelri, budesonide, brovana.  -monteleukast, claritin  -short course empiric abx is fine  -IS, mobility  -has ENT referral 8/28 & follow up w pulm 9/5  CKD IIIb, solitary kidney  HFpEF HTN HLD -giving lasix as above  -following renal indices, UOP -- verified that strict I/O have already been ordered.  -statin    PCCM will sign off, please let us know if we can be of further assistance or if pt clinical status changes   Best Practice (right click and "Reselect all SmartList Selections"  daily)   Per primary   Labs   CBC: Recent Labs  Lab 02/23/23 1256 02/24/23 0305 02/25/23 0301  WBC 6.8 4.4 5.6  HGB 12.9 12.1 10.3*  HCT 45.0 43.7 36.5  MCV 77.6* 78.3* 76.5*  PLT 154 139* 137*    Basic Metabolic Panel: Recent Labs  Lab 02/23/23 1256 02/24/23 0305 02/24/23 0448 02/25/23 0301  NA 143 141  --  137  K 4.4 5.0  --  5.0  CL 93* 94*  --  92*  CO2 39* 39*  --  37*  GLUCOSE 103* 156*  --  114*  BUN 32* 30*  --  45*  CREATININE 1.47* 1.23*  --  1.49*  CALCIUM 9.3 8.9  --  8.4*  MG 1.6* 2.1  --  1.7  PHOS  --   --  4.6 3.1   GFR: Estimated Creatinine Clearance: 49.9 mL/min (A) (by C-G formula based on SCr of 1.49 mg/dL  (H)). Recent Labs  Lab 02/23/23 1256 02/24/23 0305 02/25/23 0301  WBC 6.8 4.4 5.6    Liver Function Tests: Recent Labs  Lab 02/24/23 0305  AST 19  ALT 13  ALKPHOS 118  BILITOT 0.5  PROT 7.0  ALBUMIN 3.3*   No results for input(s): "LIPASE", "AMYLASE" in the last 168 hours. No results for input(s): "AMMONIA" in the last 168 hours.  ABG    Component Value Date/Time   PHART 7.25 (L) 02/24/2023 0803   PCO2ART 103 (HH) 02/24/2023 0803   PO2ART 102 02/24/2023 0803   HCO3 45.2 (H) 02/24/2023 0803   TCO2 30 09/13/2018 0843   O2SAT 99.8 02/24/2023 0803     Coagulation Profile: No results for input(s): "INR", "PROTIME" in the last 168 hours.  Cardiac Enzymes: No results for input(s): "CKTOTAL", "CKMB", "CKMBINDEX", "TROPONINI" in the last 168 hours.  HbA1C: HbA1c, POC (prediabetic range)  Date/Time Value Ref Range Status  10/21/2021 11:52 AM 5.5 (A) 5.7 - 6.4 % Final  09/11/2021 09:48 AM 5.8 5.7 - 6.4 % Final   HbA1c, POC (controlled diabetic range)  Date/Time Value Ref Range Status  10/21/2021 11:52 AM 5.5 0.0 - 7.0 % Final  09/11/2021 09:48 AM 5.8 0.0 - 7.0 % Final   HbA1c POC (<> result, manual entry)  Date/Time Value Ref Range Status  10/21/2021 11:52 AM 5.5 4.0 - 5.6 % Final  09/11/2021 09:48 AM 5.8 4.0 - 5.6 % Final   Hgb A1c MFr Bld  Date/Time Value Ref Range Status  09/24/2022 08:57 AM 5.6 4.8 - 5.6 % Final    Comment:             Prediabetes: 5.7 - 6.4          Diabetes: >6.4          Glycemic control for adults with diabetes: <7.0   08/17/2022 04:49 AM 5.4 4.8 - 5.6 % Final    Comment:    (NOTE) Pre diabetes:          5.7%-6.4%  Diabetes:              >6.4%  Glycemic control for   <7.0% adults with diabetes     CBG: Recent Labs  Lab 02/24/23 1212  GLUCAP 110*   CCT na    Tessie Fass MSN, AGACNP-BC [facility] Pulmonary/Critical Care Medicine Amion for pager  02/25/2023, 10:38 AM

## 2023-02-25 NOTE — Progress Notes (Signed)
eLink Physician-Brief Progress Note Patient Name: Pamela [name] DOB: 29-May-1951 MRN: [id number]   Date of Service  02/25/2023  HPI/Events of Note  Mg = 1.7. Crt 1.49.   eICU Interventions  Magnesium sulfate ordered     Intervention Category Minor Interventions: Electrolytes abnormality - evaluation and management    02/25/2023, 4:34 AM

## 2023-02-25 NOTE — Progress Notes (Signed)
Pt found awake, alert, watching tv, no respiratory distress / increased wob noted.  Bipap remains in room on standby.  Pt ok with plan to wear bipap later tonight.  RN aware.

## 2023-02-25 NOTE — Progress Notes (Signed)
   02/25/23 2307  BiPAP/CPAP/SIPAP  BiPAP/CPAP/SIPAP Pt Type Adult  BiPAP/CPAP/SIPAP V60  Mask Type Full face mask  Mask Size Medium  Set Rate 14 breaths/min  Respiratory Rate 19 breaths/min  IPAP 22 cmH20  EPAP 8 cmH2O  FiO2 (%) 35 %  Minute Ventilation 9.4  Leak 0  Peak Inspiratory Pressure (PIP) 22  Tidal Volume (Vt) 484  Patient Home Equipment No  Auto Titrate No  Press High Alarm 30 cmH2O  Press Low Alarm 5 cmH2O  CPAP/SIPAP surface wiped down Yes  Oxygen Percent 35 %  BiPAP/CPAP /SiPAP Vitals  Pulse Rate 70  Resp 19  BP 134/69  Bilateral Breath Sounds Diminished

## 2023-02-25 NOTE — Progress Notes (Signed)
[facility] Progress Note  Patient: Pamela [name]    VHQ:[id number]  DOA: 02/23/2023     Date of Service: the patient was seen and examined on 02/25/2023  Chief Complaint  Patient presents with   Shortness of Breath   Brief hospital course: Laferne Barzee is a 72 y.o. female with PMH of  COPD on 2L ox via [place], heart failure with preserved EF, HTN, morbid obesity, sarcoidosis, lung cancer adenocarcinoma in 2012 in remission as reviewed from EMR, presented at [facility] long ED due to 4 days of hypoxic respiratory failure, and dyspnea due to exacerbation of COPD.  Patient states that for the last 3 to 4 days, she has been getting more short of breath with exertion, has intermittent cough productive of clear sputum, and has had to turn her oxygen from baseline of 2 L, to 4 L continuous.  Is any chest pains, fevers, nausea, vomiting, abdominal pain.  She says that she has bilateral lower extremity edema right greater than left, which is currently stable.  Denies any other complaints.  Contacted her pulmonologist Dr. Sherene Sires, who told her to come to the ER where she was noted initially to saturate 77% on 4 L.   ED Course: afebrile, blood pressure 157/105, saturating 73% on 4 L.  This improved to 100% with breathing treatments.  She was given IV magnesium, IV Solu-Medrol, breathing treatments and her breathing is now improved.  Venous blood gas 7.23, pCO2 110, pO2 71.  Lab work reveals stable CKD 1.47, magnesium 1.6, unremarkable CBC.  COVID PCR negative    Assessment and Plan:  Acute on chronic hypoxic and hypercapnic respiratory failure due to COPD exacerbation Chest x-ray with no acute abnormality, no fevers or cytosis, so low suspicion for acute bacterial infection.  COVID-negative, RVP negative Continue DuoNeb every 6 hourly Empiric antibiotics doxycycline 100 mg IV every 12 hourly Solu-Medrol 40 mg IV every 12 hours PPI for GI prophylaxis Started Breo Ellipta inhaler Mucinex 600 mg p.o. twice  daily, Tussionex as needed for cough Continue incentive spirometry and flutter valve Continue BiPAP as needed, patient was unable to tolerate.  Encouraged to use the BiPAP Pulmonary consult appreciated.   CKD stage III-with history of solitary congenital kidney, renal function appears to be at baseline Creatinine slightly elevated due to diuresis, continue to monitor renal functions Monitor urine output-Daily  GERD-Protonix p.o.   Diastolic congestive heart failure-with chronic edema, but no evidence of acute exacerbation.  Note normal BNP. -on Lasix 40 mg p.o. daily 8/7  Lasix 40 mg IV twice daily 8/8 start on Lasix IV infusion Follow-up venous duplex to rule out DVT  Hyperlipidemia-Lipitor   Prediabetes-note hemoglobin A1c earlier this year less than 6, simply monitor glucose for now with morning labs especially in the setting of high-dose IV steroids.   Morbid obesity-BMI 55.04, complicating all aspects of care    Body mass index is 54.04 kg/m.  Interventions:     Diet: Heart healthy diet, fluid striction 1.52/day DVT Prophylaxis: Subcutaneous Lovenox   Advance goals of care discussion: Full code  Family Communication: family was not  present at bedside, at the time of interview.  The pt provided permission to discuss medical plan with the family. Opportunity was given to ask question and all questions were answered satisfactorily.   Disposition:  Pt is from Home, admitted with acute on chronic respiratory failure requiring BiPAP, and lower extremity edema, still has significant respiratory failure and volume overload, started Lasix IV infusion, which precludes a  safe discharge. Discharge to home, when stable.  Subjective: No significant overnight events, patient feels improvement in the shortness of breath, denies any chest pain or palpitation, no any other active issues.  Still has significant edema in the lower extremities.   Physical Exam: General: NAD, lying  comfortably Appear in no distress, affect appropriate Eyes: PERRLA ENT: Oral Mucosa Clear, moist  Neck: no JVD,  Cardiovascular: S1 and S2 Present, no Murmur,  Respiratory: good respiratory effort, Mild bilateral wheezing, mild basilar crackles. Abdomen: Bowel Sound present, Soft and no tenderness,  Skin: no rashes Extremities: 4+ pedal edema, no calf tenderness Neurologic: without any new focal findings Gait not checked due to patient safety concerns  Vitals:   02/25/23 0815 02/25/23 0822 02/25/23 1046 02/25/23 1200  BP:      Pulse:  70 88   Resp:  14 16   Temp:    98.7 F (37.1 C)  TempSrc:    Oral  SpO2: 96% 98% (S) (!) 87%   Weight:      Height:        Intake/Output Summary (Last 24 hours) at 02/25/2023 1436 Last data filed at 02/25/2023 1000 Gross per 24 hour  Intake 863.9 ml  Output --  Net 863.9 ml   Filed Weights   02/23/23 1237  Weight: (!) 142.8 kg    Data Reviewed: I have personally reviewed and interpreted daily labs, tele strips, imagings as discussed above. I reviewed all nursing notes, pharmacy notes, vitals, pertinent old records I have discussed plan of care as described above with RN and patient/family.  CBC: Recent Labs  Lab 02/23/23 1256 02/24/23 0305 02/25/23 0301  WBC 6.8 4.4 5.6  HGB 12.9 12.1 10.3*  HCT 45.0 43.7 36.5  MCV 77.6* 78.3* 76.5*  PLT 154 139* 137*   Basic Metabolic Panel: Recent Labs  Lab 02/23/23 1256 02/24/23 0305 02/24/23 0448 02/25/23 0301  NA 143 141  --  137  K 4.4 5.0  --  5.0  CL 93* 94*  --  92*  CO2 39* 39*  --  37*  GLUCOSE 103* 156*  --  114*  BUN 32* 30*  --  45*  CREATININE 1.47* 1.23*  --  1.49*  CALCIUM 9.3 8.9  --  8.4*  MG 1.6* 2.1  --  1.7  PHOS  --   --  4.6 3.1    Studies: No results found.  Scheduled Meds:  arformoterol  15 mcg Nebulization BID   aspirin EC  81 mg Oral Daily   atorvastatin  40 mg Oral Daily   budesonide (PULMICORT) nebulizer solution  0.5 mg Nebulization BID    calcitRIOL  0.25 mcg Oral Daily   Chlorhexidine Gluconate Cloth  6 each Topical Daily   docusate sodium  100 mg Oral Daily   doxycycline  100 mg Oral Q12H   enoxaparin (LOVENOX) injection  70 mg Subcutaneous Q24H   famotidine  20 mg Oral QHS   furosemide  40 mg Intravenous BID   [START ON 02/26/2023] furosemide  40 mg Intravenous Daily   guaiFENesin  600 mg Oral BID   loratadine  10 mg Oral Daily   [START ON 02/26/2023] methylPREDNISolone (SOLU-MEDROL) injection  40 mg Intravenous Daily   montelukast  10 mg Oral QHS   mouth rinse  15 mL Mouth Rinse 4 times per day   revefenacin  175 mcg Nebulization Daily   Continuous Infusions:   PRN Meds: acetaminophen **OR** acetaminophen, albuterol, chlorpheniramine-HYDROcodone, ondansetron **OR** ondansetron (  ZOFRAN) IV, mouth rinse, polyethylene glycol, traZODone  Time spent: 55 minutes  Author: Gillis Santa. MD [facility] 02/25/2023 2:36 PM  To reach On-call, see care teams to locate the attending and reach out to them via [email or web address]. If 7PM-7AM, please contact night-coverage If you still have difficulty reaching the attending provider, please page the [facility] (Director on Call) for [facility] on amion for assistance.

## 2023-02-25 NOTE — Progress Notes (Signed)
Bilateral lower extremity venous duplex has been completed. Preliminary results can be found in CV Proc through chart review.   02/25/23 3:11 PM Olen Cordial RVT

## 2023-02-25 NOTE — Evaluation (Signed)
Physical Therapy Evaluation Patient Details Name: Pamela [name] MRN: [id number] DOB: 24-Mar-1951 Today's Date: 02/25/2023  History of Present Illness  72 y.o. female with PMH of  COPD on 2L ox via [place], heart failure with preserved EF, HTN, morbid obesity, sarcoidosis, lung cancer adenocarcinoma in 2012 in remission as reviewed from EMR, presented at [facility] long ED due to 4 days of hypoxic respiratory failure, and dyspnea due to exacerbation of COPD.  Clinical Impression  Pt admitted with above diagnosis.  Pt currently with functional limitations due to the deficits listed below (see PT Problem List). Pt will benefit from acute skilled PT to increase their independence and safety with mobility to allow discharge.     The patient  reclined  in recliner, began to cough continuously. Assisted Patient to sit up straight in recliner   with coughing continued. Noted SPO2 dropping to low  80' on 3 L, RN in to place  on5 L. Patient assisted back in bed for dopplers.  Continue PT for mobility.      If plan is discharge home, recommend the following: A lot of help with walking and/or transfers;A lot of help with bathing/dressing/bathroom;Assistance with cooking/housework;Assist for transportation;Help with stairs or ramp for entrance   Can travel by private vehicle        Equipment Recommendations None recommended by PT  Recommendations for Other Services       Functional Status Assessment Patient has had a recent decline in their functional status and demonstrates the ability to make significant improvements in function in a reasonable and predictable amount of time.     Precautions / Restrictions Precautions Precaution Comments: on O2, dsats      Mobility  Bed Mobility Overal bed mobility: Needs Assistance Bed Mobility: Sit to Supine       Sit to supine: Max assist   General bed mobility comments: assist legs back onto bed    Transfers Overall transfer level: Needs  assistance Equipment used: Rolling walker (2 wheels) Transfers: Sit to/from Stand, Bed to chair/wheelchair/BSC Sit to Stand: +2 safety/equipment, +2 physical assistance   Step pivot transfers: +2 physical assistance, +2 safety/equipment, Min assist       General transfer comment: mod support to stand from recliner, stepped to bed.    Ambulation/Gait                  Stairs            Wheelchair Mobility     Tilt Bed    Modified Rankin (Stroke Patients Only)       Balance Overall balance assessment: Needs assistance Sitting-balance support: Bilateral upper extremity supported, Feet supported Sitting balance-Leahy Scale: Fair     Standing balance support: Bilateral upper extremity supported, During functional activity, Reliant on assistive device for balance Standing balance-Leahy Scale: Fair                               Pertinent Vitals/Pain Pain Assessment Pain Assessment: No/denies pain    Home Living Family/patient expects to be discharged to:: Private residence Living Arrangements: Children;Other relatives Available Help at Discharge: Available 24 hours/day Type of Home: House Home Access: Stairs to enter Entrance Stairs-Rails: Right;Left Entrance Stairs-Number of Steps: 1     Home Equipment: Cane - single Librarian, academic (2 wheels);Rollator (4 wheels) Additional Comments: son and family live on second floor, ,  grand kids take  it up and down steps for  her    Prior Function Prior Level of Function : Independent/Modified Independent             Mobility Comments: pt using SPC in the home or rollator in the home depending on fatigue, ind with in/out of bed and lift chair ADLs Comments: pt reports ind with self care, shares chores with son     Extremity/Trunk Assessment   Upper Extremity Assessment Upper Extremity Assessment: Overall WFL for tasks assessed    Lower Extremity Assessment Lower Extremity Assessment:  Generalized weakness    Cervical / Trunk Assessment Cervical / Trunk Assessment: Other exceptions Cervical / Trunk Exceptions: body habitus  Communication   Communication Communication: No apparent difficulties (except when SOB)  Cognition Arousal: Alert Behavior During Therapy: WFL for tasks assessed/performed, Restless Overall Cognitive Status: Within Functional Limits for tasks assessed                                 General Comments: continuos coughing        General Comments      Exercises     Assessment/Plan    PT Assessment Patient needs continued PT services  PT Problem List Decreased strength;Decreased knowledge of precautions;Cardiopulmonary status limiting activity;Decreased activity tolerance;Decreased mobility       PT Treatment Interventions DME instruction;Functional mobility training;Gait training;Therapeutic activities;Therapeutic exercise;Patient/family education    PT Goals (Current goals can be found in the Care Plan section)  Acute Rehab PT Goals Patient Stated Goal: go home PT Goal Formulation: With patient Time For Goal Achievement: 03/11/23 Potential to Achieve Goals: Fair    Frequency Min 1X/week     Co-evaluation               AM-PAC PT "6 Clicks" Mobility  Outcome Measure Help needed turning from your back to your side while in a flat bed without using bedrails?: A Lot Help needed moving from lying on your back to sitting on the side of a flat bed without using bedrails?: A Lot Help needed moving to and from a bed to a chair (including a wheelchair)?: A Lot Help needed standing up from a chair using your arms (e.g., wheelchair or bedside chair)?: A Lot Help needed to walk in hospital room?: Total Help needed climbing 3-5 steps with a railing? : Total 6 Click Score: 10    End of Session Equipment Utilized During Treatment: Oxygen Activity Tolerance: Patient limited by fatigue;Treatment limited secondary to  medical complications (Comment) Patient left: in bed;with nursing/sitter in room;with call bell/phone within reach Nurse Communication: Mobility status PT Visit Diagnosis: Unsteadiness on feet (R26.81);Difficulty in walking, not elsewhere classified (R26.2)    Time: 8416-6063 PT Time Calculation (min) (ACUTE ONLY): 21 min   Charges:   PT Evaluation $PT Eval Low Complexity: 1 Low   PT General Charges $$ ACUTE PT VISIT: 1 Visit         Blanchard Kelch PT Acute Rehabilitation Services Office [phone] Weekend pager-[phone]   Rada Hay 02/25/2023, 4:47 PM

## 2023-02-26 ENCOUNTER — Inpatient Hospital Stay (HOSPITAL_COMMUNITY): Payer: Medicare PPO

## 2023-02-26 DIAGNOSIS — J9602 Acute respiratory failure with hypercapnia: Secondary | ICD-10-CM | POA: Diagnosis not present

## 2023-02-26 DIAGNOSIS — J9612 Chronic respiratory failure with hypercapnia: Secondary | ICD-10-CM | POA: Diagnosis not present

## 2023-02-26 MED ORDER — FUROSEMIDE 10 MG/ML IJ SOLN
40.0000 mg | Freq: Every day | INTRAMUSCULAR | Status: DC
  Administered 2023-02-26: 40 mg via INTRAVENOUS
  Filled 2023-02-26 (×2): qty 4

## 2023-02-26 MED ORDER — DEXTROMETHORPHAN POLISTIREX ER 30 MG/5ML PO SUER
30.0000 mg | Freq: Two times a day (BID) | ORAL | Status: DC
  Administered 2023-02-26 – 2023-03-03 (×11): 30 mg via ORAL
  Filled 2023-02-26 (×12): qty 5

## 2023-02-26 MED ORDER — BENZONATATE 100 MG PO CAPS
100.0000 mg | ORAL_CAPSULE | Freq: Three times a day (TID) | ORAL | Status: DC
  Administered 2023-02-26 – 2023-03-03 (×17): 100 mg via ORAL
  Filled 2023-02-26 (×17): qty 1

## 2023-02-26 MED ORDER — IPRATROPIUM-ALBUTEROL 0.5-2.5 (3) MG/3ML IN SOLN
3.0000 mL | Freq: Three times a day (TID) | RESPIRATORY_TRACT | Status: DC
  Administered 2023-02-26 – 2023-03-03 (×15): 3 mL via RESPIRATORY_TRACT
  Filled 2023-02-26 (×15): qty 3

## 2023-02-26 MED ORDER — IPRATROPIUM-ALBUTEROL 0.5-2.5 (3) MG/3ML IN SOLN
3.0000 mL | Freq: Four times a day (QID) | RESPIRATORY_TRACT | Status: DC
  Administered 2023-02-26: 3 mL via RESPIRATORY_TRACT
  Filled 2023-02-26: qty 3

## 2023-02-26 NOTE — Progress Notes (Addendum)
0830 patient alert x4 able to make all needs known on 5l [place] 1200 patient up to BCS had Bowel movement bath completed then transferred to chair 1800 Incentive spirometry given with teaching and patient demonstrated back 1815 flutter valve given  with teaching and patient demonstrated back

## 2023-02-26 NOTE — Progress Notes (Signed)
Orthopedic Tech Progress Note Patient Details:  Pamela [name] 1951-07-14 [id number]  Ortho Devices Type of Ortho Device: Radio broadcast assistant Ortho Device/Splint Location: Bi LE Ortho Device/Splint Interventions: Application   Post Interventions Patient Tolerated: Well   E  02/26/2023, 10:30 AM

## 2023-02-26 NOTE — Progress Notes (Signed)
OT Cancellation Note  Patient Details Name: Pamela [name] MRN: [id number] DOB: 02/06/1951   Cancelled Treatment:    Reason Eval/Treat Not Completed: Other (comment). Pt fatigued from earlier PT session. Will attempt at a later time.   ,HILLARY 02/26/2023, 1:43 PM Luisa Dago, OT/L   Acute OT Clinical Specialist Acute Rehabilitation Services Pager [phone] Office [phone]

## 2023-02-26 NOTE — Hospital Course (Signed)
Brief hospital course: Pamela [name] is a 72 y.o. female with PMH of  COPD on 2L ox via [place], heart failure with preserved EF, HTN, morbid obesity, sarcoidosis, lung cancer adenocarcinoma in 2012 in remission as reviewed from EMR, presented at [facility] long ED due to 4 days of hypoxic respiratory failure, and dyspnea due to exacerbation of COPD.  Patient states that for the last 3 to 4 days, she has been getting more short of breath with exertion, has intermittent cough productive of clear sputum, and has had to turn her oxygen from baseline of 2 L, to 4 L continuous.  Is any chest pains, fevers, nausea, vomiting, abdominal pain.  She says that she has bilateral lower extremity edema right greater than left, which is currently stable.  Denies any other complaints.  Contacted her pulmonologist Dr. Sherene Sires, who told her to come to the ER where she was noted initially to saturate 77% on 4 L.  Currently being treated for COPD exacerbation with antibiotics and steroids.  Also being diuresed.  Assessment and Plan: Acute on chronic hypoxic and hypercapnic respiratory failure due to COPD exacerbation Chest x-ray with no acute abnormality, no fevers or cytosis, so low suspicion for acute bacterial infection.  COVID-negative, RVP negative Patient was on Brovana Pulmicort Yupelri Switching to DuoNebs again. Continue doxycycline. Continue Solu-Medrol. Add Lawyer.  Mucinex 600 mg p.o. twice daily, Tussionex as needed for cough Continue incentive spirometry and flutter valve Continue BiPAP as needed and nightly. Pulmonary consult appreciated. Recommending NIV on discharge.  TOC consulted.  Patient currently agreeable after initial resistance.  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   Appears to be stable. Monitor while being diuresed. BUN trending up.   GERD Protonix p.o.  Acute on chronic diastolic CHF. Initially was on oral Lasix.   Now on IV Lasix infusion. Will switch back to Lasix IV pushes. Echocardiogram in January 2024 shows EF of 6065%. Patient refusing teds, will attempt Unna boots.   HLD. Continue statin.   Prediabetes. Hemoglobin A1c appears to be well-controlled. Monitor.   Class III obesity. Body mass index is 54.04 kg/m.  At risk for obesity hypoventilation syndrome. Recommend NIV on discharge. Placing the patient at high risk for poor outcome.  Mild hyperkalemia. Monitoring for now.  Microcytic anemia. Hemoglobin around 11. Iron level last year was adequate. Will recheck.  Thrombocytopenia. Appears to be acute and worsening. Platelet count 127 today. Will check further workup. Lower extremity Dopplers were negative for any DVT. Making consumption etiology less likely.

## 2023-02-26 NOTE — Progress Notes (Signed)
   02/26/23 2022  BiPAP/CPAP/SIPAP  BiPAP/CPAP/SIPAP Pt Type Adult  BiPAP/CPAP/SIPAP V60  Reason BIPAP/CPAP not in use Other(comment) (Pt is not ready to wear it yet, will try again)  Mask Type Full face mask  Mask Size Medium  Set Rate 14 breaths/min  Respiratory Rate 19 breaths/min  IPAP 22 cmH20  EPAP 8 cmH2O  FiO2 (%) 35 %  BiPAP/CPAP /SiPAP Vitals  Pulse Rate 67  Resp (!) 21  SpO2 100 %  Bilateral Breath Sounds Diminished  MEWS Score/Color  MEWS Score 1  MEWS Score Color Green

## 2023-02-26 NOTE — Progress Notes (Signed)
[facility] Progress Note Patient: Pamela [name] GNF:[id number] DOB: Mar 05, 1951 DOA: 02/23/2023  DOS: the patient was seen and examined on 02/26/2023  Brief hospital course: Pamela [name] is a 72 y.o. female with PMH of  COPD on 2L ox via , heart failure with preserved EF, HTN, morbid obesity, sarcoidosis, lung cancer adenocarcinoma in 2012 in remission as reviewed from EMR, presented at [facility] long ED due to 4 days of hypoxic respiratory failure, and dyspnea due to exacerbation of COPD.  Patient states that for the last 3 to 4 days, she has been getting more short of breath with exertion, has intermittent cough productive of clear sputum, and has had to turn her oxygen from baseline of 2 L, to 4 L continuous.  Is any chest pains, fevers, nausea, vomiting, abdominal pain.  She says that she has bilateral lower extremity edema right greater than left, which is currently stable.  Denies any other complaints.  Contacted her pulmonologist Dr. Sherene Sires, who told her to come to the ER where she was noted initially to saturate 77% on 4 L.  Currently being treated for COPD exacerbation with antibiotics and steroids.  Also being diuresed.  Assessment and Plan: Acute on chronic hypoxic and hypercapnic respiratory failure due to COPD exacerbation Chest x-ray with no acute abnormality, no fevers or cytosis, so low suspicion for acute bacterial infection.  COVID-negative, RVP negative Patient was on Brovana Pulmicort Yupelri Switching to DuoNebs again. Continue doxycycline. Continue Solu-Medrol. Add Lawyer.  Mucinex 600 mg p.o. twice daily, Tussionex as needed for cough Continue incentive spirometry and flutter valve Continue BiPAP as needed and nightly. Pulmonary consult appreciated. Recommending NIV on discharge.  TOC consulted.  Patient currently agreeable after initial resistance.  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    Appears to be stable. Monitor while being diuresed. BUN trending up.   GERD Protonix p.o.  Acute on chronic diastolic CHF. Initially was on oral Lasix.  Now on IV Lasix infusion. Will switch back to Lasix IV pushes. Echocardiogram in January 2024 shows EF of 6065%. Patient refusing teds, will attempt Unna boots.   HLD. Continue statin.   Prediabetes. Hemoglobin A1c appears to be well-controlled. Monitor.   Class III obesity. Body mass index is 54.04 kg/m.  At risk for obesity hypoventilation syndrome. Recommend NIV on discharge. Placing the patient at high risk for poor outcome.  Mild hyperkalemia. Monitoring for now.  Microcytic anemia. Hemoglobin around 11. Iron level last year was adequate. Will recheck.  Thrombocytopenia. Appears to be acute and worsening. Platelet count 127 today. Will check further workup. Lower extremity Dopplers were negative for any DVT. Making consumption etiology less likely.   Subjective: Reports shortness of breath.  No nausea no vomiting but no chest pain.  No significant cough.  Used BiPAP last night but does not like to use it.  Physical Exam: General: in Mild distress, No Rash Cardiovascular: S1 and S2 Present, No Murmur Respiratory: Good respiratory effort, Bilateral Air entry present.  Faint basal crackles, No wheezes Abdomen: Bowel Sound present, No tenderness Extremities: Bilateral edema Neuro: Alert and oriented x3, no new focal deficit  Data Reviewed: I have Reviewed nursing notes, Vitals, and Lab results. Since last encounter, pertinent lab results CBC and BMP   . I have ordered test including CBC BMP magnesium  . I have ordered imaging chest x-ray  .  Discussed with pulmonary.  Disposition: Status is: Inpatient Remains inpatient appropriate because: Requiring  IV diuresis and further respiratory support  Place TED hose Start: 02/25/23 1317 SCDs Start: 02/23/23 1507   Family Communication: No one at  bedside Level of care: Stepdown continue stepdown due to BiPAP needs. Vitals:   02/26/23 1400 02/26/23 1500 02/26/23 1543 02/26/23 1600  BP: (!) 143/68 (!) 140/78  (!) 145/73  Pulse: 73 68  77  Resp: (!) 22 (!) 22  17  Temp:   98.4 F (36.9 C)   TempSrc:   Oral   SpO2: 97% 99%  95%  Weight:      Height:         Author: Lynden Oxford, MD 02/26/2023 6:26 PM  Please look on [email or web address] to find out who is on call.

## 2023-02-26 NOTE — Progress Notes (Signed)
Per chart review patient currently in [facility] SDU for acute on chronic respiratory acidosis. Received TOC consult for HH/DME: bipap or NIV.   TOC will continue to follow for needs.     02/26/23 1828  TOC Brief Assessment  Insurance and Status Reviewed  Patient has primary care physician Yes  Home environment has been reviewed from home with adult children  Prior level of function: independent  Prior/Current Home Services No current home services  Readmission risk has been reviewed Yes  Transition of care needs transition of care needs identified, TOC will continue to follow

## 2023-02-26 NOTE — Plan of Care (Signed)

## 2023-02-26 NOTE — Plan of Care (Signed)
  Problem: Education: Goal: Knowledge of General Education information will improve Description: Including pain rating scale, medication(s)/side effects and non-pharmacologic comfort measures Outcome: Progressing   Problem: Health Behavior/Discharge Planning: Goal: Ability to manage health-related needs will improve Outcome: Progressing   Problem: Clinical Measurements: Goal: Ability to maintain clinical measurements within normal limits will improve Outcome: Progressing Goal: Will remain free from infection Outcome: Progressing Goal: Diagnostic test results will improve Outcome: Progressing Goal: Respiratory complications will improve Outcome: Not Progressing Goal: Cardiovascular complication will be avoided Outcome: Progressing   Problem: Activity: Goal: Risk for activity intolerance will decrease Outcome: Progressing   Problem: Nutrition: Goal: Adequate nutrition will be maintained Outcome: Progressing   Problem: Coping: Goal: Level of anxiety will decrease Outcome: Progressing   Problem: Elimination: Goal: Will not experience complications related to bowel motility Outcome: Progressing Goal: Will not experience complications related to urinary retention Outcome: Progressing   Problem: Pain Managment: Goal: General experience of comfort will improve Outcome: Progressing   Problem: Safety: Goal: Ability to remain free from injury will improve Outcome: Not Progressing Note: Uses walker at baseline    Problem: Skin Integrity: Goal: Risk for impaired skin integrity will decrease Outcome: Progressing

## 2023-02-26 NOTE — Progress Notes (Signed)
   02/26/23 2336  BiPAP/CPAP/SIPAP  BiPAP/CPAP/SIPAP Pt Type Adult (Pt said she is not going to wear the bipap all night but willing to try it.)  BiPAP/CPAP/SIPAP V60  Mask Type Full face mask  Mask Size Medium  Set Rate 14 breaths/min  Respiratory Rate 22 breaths/min  IPAP (S)  18 cmH20  EPAP 8 cmH2O  FiO2 (%) 35 %  Minute Ventilation 17  Leak 5  Peak Inspiratory Pressure (PIP) 19  Tidal Volume (Vt) 804  Patient Home Equipment No  Auto Titrate No  Press High Alarm 35 cmH2O  Press Low Alarm 5 cmH2O  CPAP/SIPAP surface wiped down Yes  Oxygen Percent 35 %  BiPAP/CPAP /SiPAP Vitals  Pulse Rate 80  Resp 20  BP (!) 149/57  SpO2 96 %  Bilateral Breath Sounds Diminished  MEWS Score/Color  MEWS Score 0  MEWS Score Color Green

## 2023-02-27 DIAGNOSIS — J9602 Acute respiratory failure with hypercapnia: Secondary | ICD-10-CM | POA: Diagnosis not present

## 2023-02-27 DIAGNOSIS — J9612 Chronic respiratory failure with hypercapnia: Secondary | ICD-10-CM | POA: Diagnosis not present

## 2023-02-27 MED ORDER — DICLOFENAC SODIUM 1 % EX GEL
2.0000 g | Freq: Four times a day (QID) | CUTANEOUS | Status: DC
  Administered 2023-02-27 – 2023-03-03 (×17): 2 g via TOPICAL
  Filled 2023-02-27: qty 100

## 2023-02-27 MED ORDER — HYDROXYZINE HCL 25 MG PO TABS
25.0000 mg | ORAL_TABLET | Freq: Three times a day (TID) | ORAL | Status: DC | PRN
  Administered 2023-02-27: 25 mg via ORAL
  Filled 2023-02-27: qty 1

## 2023-02-27 MED ORDER — TORSEMIDE 20 MG PO TABS
20.0000 mg | ORAL_TABLET | Freq: Two times a day (BID) | ORAL | Status: DC
  Administered 2023-02-28 – 2023-03-01 (×3): 20 mg via ORAL
  Filled 2023-02-27 (×3): qty 1

## 2023-02-27 MED ORDER — FUROSEMIDE 10 MG/ML IJ SOLN
40.0000 mg | Freq: Once | INTRAMUSCULAR | Status: AC
  Administered 2023-02-27: 40 mg via INTRAVENOUS

## 2023-02-27 NOTE — Plan of Care (Signed)

## 2023-02-27 NOTE — Progress Notes (Signed)
   02/27/23 1945  BiPAP/CPAP/SIPAP  Reason BIPAP/CPAP not in use Other(comment) (pt not ready to wear it yet)  BiPAP/CPAP /SiPAP Vitals  Pulse Rate 90  Resp (!) 24  SpO2 95 %  MEWS Score/Color  MEWS Score 1  MEWS Score Color Green

## 2023-02-27 NOTE — Progress Notes (Signed)
   02/27/23 0309  BiPAP/CPAP/SIPAP  BiPAP/CPAP/SIPAP Pt Type Adult  BiPAP/CPAP/SIPAP V60  Mask Type Full face mask  Mask Size Medium  Set Rate 14 breaths/min  Respiratory Rate 22 breaths/min  IPAP 18 cmH20  EPAP 8 cmH2O  FiO2 (%) 35 %  Minute Ventilation 15  Leak 0  Peak Inspiratory Pressure (PIP) 19  Tidal Volume (Vt) 499  Patient Home Equipment No  Auto Titrate No  Press High Alarm 35 cmH2O  Press Low Alarm 5 cmH2O  Oxygen Percent 35 %  BiPAP/CPAP /SiPAP Vitals  Pulse Rate 74  Resp (!) 23  BP 125/65  SpO2 95 %  Bilateral Breath Sounds Diminished  MEWS Score/Color  MEWS Score 1  MEWS Score Color Green

## 2023-02-27 NOTE — Progress Notes (Signed)
[facility] Progress Note Patient: Pamela [name] ZOX:[id number] DOB: Apr 15, 1951 DOA: 02/23/2023  DOS: the patient was seen and examined on 02/27/2023  Brief hospital course: Pamela [name] is a 72 y.o. female with PMH of  COPD on 2L ox via [place], heart failure with preserved EF, HTN, morbid obesity, sarcoidosis, lung cancer adenocarcinoma in 2012 in remission as reviewed from EMR, presented at [facility] long ED due to 4 days of hypoxic respiratory failure, and dyspnea due to exacerbation of COPD.   Currently being treated for COPD exacerbation with antibiotics and steroids.  Also being diuresed.  Assessment and Plan: Acute on chronic hypoxic and hypercapnic respiratory failure due to COPD exacerbation Chest x-ray with no acute abnormality, no fevers or cytosis, so low suspicion for acute bacterial infection.  COVID-negative, RVP negative Continue nebulizer therapy. Continue doxycycline. Continue Solu-Medrol. Continue suppressive medicines for cough. Continue incentive spirometry and flutter valve Continue BiPAP as needed and nightly. Pulmonary consult appreciated. Recommending NIV on discharge.  TOC consulted.   Patient currently agreeable after initial resistance.  Acute on chronic diastolic CHF. Initially was on oral Lasix.  Continue as needed IV Lasix.  Switch to torsemide. Echocardiogram in January 2024 shows EF of 6065%. Continue no words.  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   Appears to be stable. Monitor while being diuresed.   GERD Protonix p.o.  Bilateral knee pain. Voltaren gel   HLD. Continue statin.   Prediabetes. Hemoglobin A1c appears to be well-controlled. Monitor.   Class III obesity. Body mass index is 54.04 kg/m.  At risk for obesity hypoventilation syndrome. Recommend NIV on discharge. Placing the patient at high risk for poor outcome.  Mild hyperkalemia. Monitoring for  now.  Microcytic anemia. Hemoglobin around 11. Iron level last year was adequate. Will recheck.  Thrombocytopenia. Appears to be acute.  Appears to be stabilizing now. Lower extremity Dopplers were negative for any DVT. Making consumption etiology less likely.   Subjective: No acute complaints.  Reports bilateral knee pain.  No fever no chills.  Physical Exam: General: in Mild distress, No Rash Cardiovascular: S1 and S2 Present, No Murmur Respiratory: Good respiratory effort, Bilateral Air entry present. No Crackles, No wheezes Abdomen: Bowel Sound present, No tenderness Extremities: Bilateral edema Neuro: Alert and oriented x3, no new focal deficit  Data Reviewed: I have Reviewed nursing notes, Vitals, and Lab results. Since last encounter, pertinent lab results CBC and BMP   . I have ordered test including CBC and BMP  .   Disposition: Status is: Inpatient Remains inpatient appropriate because: Needing diuresis  Place TED hose Start: 02/25/23 1317 SCDs Start: 02/23/23 1507   Family Communication: No one at bedside Level of care: Stepdown if remains stable likely transfer to progressive care tomorrow. Vitals:   02/27/23 1200 02/27/23 1215 02/27/23 1452 02/27/23 1555  BP: (!) 156/57     Pulse: 88     Resp: (!) 30     Temp:  98.6 F (37 C)  97.9 F (36.6 C)  TempSrc:    Oral  SpO2: 92%  95%   Weight:      Height:         Author: Lynden Oxford, MD 02/27/2023 5:12 PM  Please look on [email or web address] to find out who is on call.

## 2023-02-28 DIAGNOSIS — J9602 Acute respiratory failure with hypercapnia: Secondary | ICD-10-CM | POA: Diagnosis not present

## 2023-02-28 DIAGNOSIS — J9612 Chronic respiratory failure with hypercapnia: Secondary | ICD-10-CM | POA: Diagnosis not present

## 2023-02-28 LAB — CBC
HCT: 38.3 % (ref 36.0–46.0)
Hemoglobin: 11.2 g/dL — ABNORMAL LOW (ref 12.0–15.0)
MCH: 21.5 pg — ABNORMAL LOW (ref 26.0–34.0)
MCHC: 29.2 g/dL — ABNORMAL LOW (ref 30.0–36.0)
MCV: 73.4 fL — ABNORMAL LOW (ref 80.0–100.0)
Platelets: 148 10*3/uL — ABNORMAL LOW (ref 150–400)
RBC: 5.22 MIL/uL — ABNORMAL HIGH (ref 3.87–5.11)
RDW: 14.4 % (ref 11.5–15.5)
WBC: 5.5 10*3/uL (ref 4.0–10.5)
nRBC: 0 % (ref 0.0–0.2)

## 2023-02-28 LAB — FOLATE: Folate: 5.3 ng/mL — ABNORMAL LOW (ref >5.9)

## 2023-02-28 LAB — BASIC METABOLIC PANEL WITH GFR
Anion gap: 11 (ref 5–15)
BUN: 52 mg/dL — ABNORMAL HIGH (ref 8–23)
CO2: 41 mmol/L — ABNORMAL HIGH (ref 22–32)
Calcium: 8.6 mg/dL — ABNORMAL LOW (ref 8.9–10.3)
Chloride: 86 mmol/L — ABNORMAL LOW (ref 98–111)
Creatinine, Ser: 1.36 mg/dL — ABNORMAL HIGH (ref 0.44–1.00)
GFR, Estimated: 42 mL/min — ABNORMAL LOW (ref >60)
Glucose, Bld: 91 mg/dL (ref 70–99)
Potassium: 4.3 mmol/L (ref 3.5–5.1)
Sodium: 138 mmol/L (ref 135–145)

## 2023-02-28 LAB — IRON AND TIBC
Iron: 60 ug/dL (ref 28–170)
Saturation Ratios: 27 % (ref 10.4–31.8)
TIBC: 221 ug/dL — ABNORMAL LOW (ref 250–450)
UIBC: 161 ug/dL

## 2023-02-28 LAB — VITAMIN B12: Vitamin B-12: 558 pg/mL (ref 180–914)

## 2023-02-28 MED ORDER — FOLIC ACID 1 MG PO TABS
1.0000 mg | ORAL_TABLET | Freq: Every day | ORAL | Status: DC
  Administered 2023-02-28 – 2023-03-03 (×4): 1 mg via ORAL
  Filled 2023-02-28 (×4): qty 1

## 2023-02-28 MED ORDER — B COMPLEX-C PO TABS
1.0000 | ORAL_TABLET | Freq: Every day | ORAL | Status: DC
  Administered 2023-02-28 – 2023-03-03 (×4): 1 via ORAL
  Filled 2023-02-28 (×4): qty 1

## 2023-02-28 MED ORDER — METHYLPREDNISOLONE SODIUM SUCC 40 MG IJ SOLR
40.0000 mg | Freq: Two times a day (BID) | INTRAMUSCULAR | Status: DC
  Administered 2023-02-28 (×2): 40 mg via INTRAVENOUS
  Filled 2023-02-28 (×2): qty 1

## 2023-02-28 NOTE — Progress Notes (Signed)
Physical Therapy Treatment Patient Details Name: Pamela [name] MRN: [id number] DOB: 07-27-1950 Today's Date: 02/28/2023   History of Present Illness 72 y.o. female with PMH of  COPD on 2L oxygen via [place], heart failure with preserved EF, HTN, morbid obesity, sarcoidosis, lung cancer adenocarcinoma in 2012 in remission as reviewed from EMR, presented at [facility] long ED due to 4 days of hypoxic respiratory failure, and dyspnea due to exacerbation of COPD.  Pt admitted 02/23/23 for Acute on chronic hypoxic and hypercapnic respiratory failure due to COPD exacerbation and acute on chronic diastolic congestive heart failure    PT Comments  Pt reports feeling much better today.  Pt also presents with improved mobility and able to ambulate 160 feet on 4L O2  (see mobility section below).  Pt could likely return home with family assist now that mobility is improving, and pt appears motivated to continue progressing.     If plan is discharge home, recommend the following: A little help with walking and/or transfers;A little help with bathing/dressing/bathroom;Assist for transportation;Help with stairs or ramp for entrance;Assistance with cooking/housework   Can travel by private vehicle        Equipment Recommendations  None recommended by PT    Recommendations for Other Services       Precautions / Restrictions Precautions Precautions: Fall Precaution Comments: 2-3L baseline, currently on 4L, monitor SpO2     Mobility  Bed Mobility               General bed mobility comments: pt in recliner    Transfers Overall transfer level: Needs assistance Equipment used: Rolling walker (2 wheels) Transfers: Sit to/from Stand Sit to Stand: +2 safety/equipment, Min assist           General transfer comment: initially min assist however min/guard with second sit to stand    Ambulation/Gait Ambulation/Gait assistance: Contact guard assist Gait Distance (Feet): 160 Feet Assistive device:  Rolling walker (2 wheels) Gait Pattern/deviations: Step-through pattern, Decreased stride length       General Gait Details: pt denies symptoms, denies dyspnea, able to ambulate to her goal distance, recliner following for safety but not needed; ambulated on 4L O2  and SPO2 91-93%   Stairs             Wheelchair Mobility     Tilt Bed    Modified Rankin (Stroke Patients Only)       Balance                                            Cognition Arousal: Alert Behavior During Therapy: WFL for tasks assessed/performed, Restless Overall Cognitive Status: Within Functional Limits for tasks assessed                                          Exercises      General Comments        Pertinent Vitals/Pain Pain Assessment Pain Assessment: No/denies pain    Home Living                          Prior Function            PT Goals (current goals can now be found in the care plan section) Progress towards  PT goals: Progressing toward goals    Frequency    Min 1X/week      PT Plan      Co-evaluation              AM-PAC PT "6 Clicks" Mobility   Outcome Measure  Help needed turning from your back to your side while in a flat bed without using bedrails?: A Lot Help needed moving from lying on your back to sitting on the side of a flat bed without using bedrails?: A Lot Help needed moving to and from a bed to a chair (including a wheelchair)?: A Little Help needed standing up from a chair using your arms (e.g., wheelchair or bedside chair)?: A Little Help needed to walk in hospital room?: A Little Help needed climbing 3-5 steps with a railing? : A Lot 6 Click Score: 15    End of Session Equipment Utilized During Treatment: Gait belt;Oxygen Activity Tolerance: Patient tolerated treatment well Patient left: in chair;with call bell/phone within reach;with chair alarm set Nurse Communication: Mobility  status PT Visit Diagnosis: Difficulty in walking, not elsewhere classified (R26.2)     Time: 0981-1914 PT Time Calculation (min) (ACUTE ONLY): 23 min  Charges:    $Gait Training: 23-37 mins PT General Charges $$ ACUTE PT VISIT: 1 Visit                    Paulino Door, DPT Physical Therapist Acute Rehabilitation Services Office: [phone]    Pamela [name] 02/28/2023, 3:58 PM

## 2023-02-28 NOTE — Progress Notes (Signed)
   02/28/23 1954  BiPAP/CPAP/SIPAP  Reason BIPAP/CPAP not in use Other(comment) (Pt is not ready to wear it yet machine remained bedside)  BiPAP/CPAP /SiPAP Vitals  Pulse Rate 75  Resp (!) 23  SpO2 98 %  MEWS Score/Color  MEWS Score 1  MEWS Score Color Green

## 2023-02-28 NOTE — Progress Notes (Signed)
   02/28/23 0002  BiPAP/CPAP/SIPAP  BiPAP/CPAP/SIPAP Pt Type Adult  BiPAP/CPAP/SIPAP V60  Mask Type Full face mask  Mask Size Medium  Set Rate 14 breaths/min  Respiratory Rate 17 breaths/min  IPAP 18 cmH20  EPAP 8 cmH2O  FiO2 (%) 35 %  Minute Ventilation 13  Leak 0  Peak Inspiratory Pressure (PIP) 18  Tidal Volume (Vt) 920  Patient Home Equipment No  Auto Titrate No  Press High Alarm 35 cmH2O  Press Low Alarm 5 cmH2O  CPAP/SIPAP surface wiped down Yes  Oxygen Percent 35 %  BiPAP/CPAP /SiPAP Vitals  Pulse Rate 71  Resp 16  BP (!) 117/94  SpO2 99 %  Bilateral Breath Sounds Diminished  MEWS Score/Color  MEWS Score 0  MEWS Score Color [place] Si

## 2023-02-28 NOTE — Plan of Care (Signed)

## 2023-02-28 NOTE — Progress Notes (Signed)
[facility] Progress Note Patient: Pamela [name] ZOX:[id number] DOB: 1951-01-27 DOA: 02/23/2023  DOS: the patient was seen and examined on 02/28/2023  Brief hospital course: Nainika Bencivengo is a 72 y.o. female with PMH of  COPD on 2L ox via [place], heart failure with preserved EF, HTN, morbid obesity, sarcoidosis, lung cancer adenocarcinoma in 2012 in remission as reviewed from EMR, presented at [facility] long ED due to 4 days of hypoxic respiratory failure, and dyspnea due to exacerbation of COPD.   Currently being treated for COPD exacerbation with antibiotics and steroids.  Also being diuresed.  Assessment and Plan: Acute on chronic hypoxic and hypercapnic respiratory failure due to COPD exacerbation Chest x-ray with no acute abnormality, no fevers or cytosis, so low suspicion for acute bacterial infection.  COVID-negative, RVP negative Continue nebulizer therapy. Continue doxycycline. Continue Solu-Medrol.  Will attempt to see if a higher dose of Solu-Medrol will help with the wheeze. Continue suppressive medicines for cough. Continue incentive spirometry and flutter valve Continue BiPAP as needed and nightly. Pulmonary consult appreciated. Recommending NIV on discharge.  TOC consulted.   Patient currently agreeable after initial resistance.  Acute on chronic diastolic CHF. Initially was on oral Lasix.  Continue as needed IV Lasix.  Switch to torsemide. Echocardiogram in January 2024 shows EF of 6065%. Continue no words.  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   Appears to be stable. Monitor while being diuresed.   GERD Protonix p.o.  Bilateral knee pain. Voltaren gel   HLD. Continue statin.   Prediabetes. Hemoglobin A1c appears to be well-controlled. Monitor.   Class III obesity. Body mass index is 54.04 kg/m.  At risk for obesity hypoventilation syndrome. Recommend NIV on discharge. Placing the patient  at high risk for poor outcome.  Mild hyperkalemia. Monitoring for now.  Microcytic anemia. Hemoglobin around 11. Iron level last year was adequate. Will recheck.  Thrombocytopenia. Appears to be acute.  Appears to be stabilizing now. Lower extremity Dopplers were negative for any DVT. Making consumption etiology less likely.   Subjective: Continues to have shortness of breath.  Although improving.  No nausea or vomiting.  Physical Exam: General: in Mild distress, No Rash Cardiovascular: S1 and S2 Present, No Murmur Respiratory: Good respiratory effort, Bilateral Air entry present. No Crackles, bilateral expiratory wheezes Abdomen: Bowel Sound present, No tenderness Extremities: Bilateral edema Neuro: Alert and oriented x3, no new focal deficit  Data Reviewed: I have Reviewed nursing notes, Vitals, and Lab results. Since last encounter, pertinent lab results CBC and BMP   . I have ordered test including CBC and BMP  .   Disposition: Status is: Inpatient Remains inpatient appropriate because: Receiving IV therapies.  Place TED hose Start: 02/25/23 1317 SCDs Start: 02/23/23 1507   Family Communication: No one at bedside Level of care: Progressive   Vitals:   02/28/23 1450 02/28/23 1500 02/28/23 1549 02/28/23 1600  BP:  120/69  (!) 107/56  Pulse:  83 80 76  Resp:  (!) 26 (!) 21 (!) 25  Temp:   98.4 F (36.9 C) 98.4 F (36.9 C)  TempSrc:   Oral Oral  SpO2: 92% 94% 97% 100%  Weight:      Height:         Author: Lynden Oxford, MD 02/28/2023 5:16 PM  Please look on [email or web address] to find out who is on call.

## 2023-02-28 NOTE — Progress Notes (Signed)
   02/28/23 0434  BiPAP/CPAP/SIPAP  BiPAP/CPAP/SIPAP Pt Type Adult  BiPAP/CPAP/SIPAP V60  Mask Type Full face mask  Mask Size Medium  Set Rate 14 breaths/min  Respiratory Rate 16 breaths/min  IPAP 18 cmH20  EPAP 8 cmH2O  FiO2 (%) 35 %  Minute Ventilation 5.9  Leak 0  Peak Inspiratory Pressure (PIP) 18  Tidal Volume (Vt) 890  Patient Home Equipment No  Auto Titrate No  Press High Alarm 35 cmH2O  Press Low Alarm 5 cmH2O  Oxygen Percent 35 %  BiPAP/CPAP /SiPAP Vitals  Pulse Rate 71  Resp 16  BP 128/70  SpO2 98 %  Bilateral Breath Sounds Diminished  MEWS Score/Color  MEWS Score 0  MEWS Score Color [place] Si

## 2023-03-01 DIAGNOSIS — J9602 Acute respiratory failure with hypercapnia: Secondary | ICD-10-CM | POA: Diagnosis not present

## 2023-03-01 DIAGNOSIS — J9612 Chronic respiratory failure with hypercapnia: Secondary | ICD-10-CM | POA: Diagnosis not present

## 2023-03-01 MED ORDER — PREDNISONE 50 MG PO TABS
50.0000 mg | ORAL_TABLET | Freq: Every day | ORAL | Status: DC
  Administered 2023-03-01 – 2023-03-03 (×3): 50 mg via ORAL
  Filled 2023-03-01 (×3): qty 1

## 2023-03-01 MED ORDER — TORSEMIDE 20 MG PO TABS
20.0000 mg | ORAL_TABLET | Freq: Every day | ORAL | Status: DC
  Administered 2023-03-02: 20 mg via ORAL
  Filled 2023-03-01: qty 1

## 2023-03-01 NOTE — Progress Notes (Signed)
[facility] Progress Note Patient: Pamela [name] MWN:[id number] DOB: Jul 14, 1951 DOA: 02/23/2023  DOS: the patient was seen and examined on 03/01/2023  Brief hospital course: Pamela [name] is a 72 y.o. female with PMH of  COPD on 2L ox via [place], heart failure with preserved EF, HTN, morbid obesity, sarcoidosis, lung cancer adenocarcinoma in 2012 in remission as reviewed from EMR, presented at [facility] long ED due to 4 days of hypoxic respiratory failure, and dyspnea due to exacerbation of COPD.   Currently being treated for COPD exacerbation with antibiotics and steroids.  Also being diuresed.  Assessment and Plan: Acute on chronic hypoxic and hypercapnic respiratory failure due to COPD exacerbation Chest x-ray with no acute abnormality, no fevers or cytosis, so low suspicion for acute bacterial infection.  COVID-negative, RVP negative Continue nebulizer therapy. Continue doxycycline. Continue Solu-Medrol.  Will attempt to see if a higher dose of Solu-Medrol will help with the wheeze. Continue suppressive medicines for cough. Continue incentive spirometry and flutter valve Continue BiPAP as needed and nightly. Pulmonary consult appreciated. Recommending NIV on discharge.  TOC consulted, and again on 8/12. Patient agreeable to use NIV on discharge.  Acute on chronic diastolic CHF. Initially was on oral Lasix, now to torsemide. Echocardiogram in January 2024 shows EF of 60-65%. Continue no words.  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   Appears to be stable. Monitor while being diuresed.   GERD Protonix p.o.  Bilateral knee pain. Voltaren gel   HLD. Continue statin.   Prediabetes. Hemoglobin A1c appears to be well-controlled. Monitor.   Class III obesity. Body mass index is 54.04 kg/m.  At risk for obesity hypoventilation syndrome. Recommend NIV on discharge. Placing the patient at high risk for poor  outcome.  Mild hyperkalemia. Monitoring for now.  Microcytic anemia. Hemoglobin around 11.  Iron level stable.  Thrombocytopenia. Appears to be acute.  Mild. Appears to be stabilizing now. Lower extremity Dopplers were negative for any DVT. Making consumption etiology less likely.   Subjective: Breathing improving.  Still has wheezing.  Physical Exam: Continues to have wheezing bilaterally. S1-S2 present. Bilateral edema present.  Data Reviewed: I have Reviewed nursing notes, Vitals, and Lab results. Reviewed CBC and BMP.  Reordered CBC and BMP.  Disposition: Status is: Inpatient Remains inpatient appropriate because: Receiving IV therapies.  SCDs Start: 02/23/23 1507   Family Communication: No one at bedside Level of care: Progressive   Vitals:   03/01/23 1300 03/01/23 1500 03/01/23 1600 03/01/23 1700  BP:      Pulse: 76 69 80 80  Resp: (!) 21 19 (!) 22 (!) 22  Temp:   98.2 F (36.8 C)   TempSrc:   Oral   SpO2: 94% 99% 95% 94%  Weight:      Height:         Author: Lynden Oxford, MD 03/01/2023 6:13 PM  Please look on [email or web address] to find out who is on call.

## 2023-03-01 NOTE — Progress Notes (Signed)
   03/01/23 2320  BiPAP/CPAP/SIPAP  BiPAP/CPAP/SIPAP Pt Type Adult  BiPAP/CPAP/SIPAP V60  Mask Type Full face mask  Mask Size Medium  Set Rate 14 breaths/min  Respiratory Rate 24 breaths/min  IPAP 18 cmH20  EPAP 8 cmH2O  FiO2 (%) 35 %  Minute Ventilation 10.8  Leak 0  Peak Inspiratory Pressure (PIP) 18  Tidal Volume (Vt) 584  Patient Home Equipment No  Auto Titrate No  Press High Alarm 35 cmH2O  Press Low Alarm 5 cmH2O  Oxygen Percent 35 %

## 2023-03-01 NOTE — Progress Notes (Signed)
   03/01/23 0002  BiPAP/CPAP/SIPAP  BiPAP/CPAP/SIPAP Pt Type Adult  BiPAP/CPAP/SIPAP V60  Mask Type Full face mask  Mask Size Medium  Set Rate 14 breaths/min  Respiratory Rate 24 breaths/min  IPAP 18 cmH20  EPAP 8 cmH2O  FiO2 (%) 35 %  Minute Ventilation 10  Leak 0  Peak Inspiratory Pressure (PIP) 18  Tidal Volume (Vt) 599  Patient Home Equipment No  Auto Titrate No  Press High Alarm 35 cmH2O  Press Low Alarm 5 cmH2O  CPAP/SIPAP surface wiped down Yes  Oxygen Percent 35 %  BiPAP/CPAP /SiPAP Vitals  Pulse Rate 78  Resp 18  BP 123/77  SpO2 95 %  Bilateral Breath Sounds Diminished  MEWS Score/Color  MEWS Score 0  MEWS Score Color [place] Si

## 2023-03-01 NOTE — Evaluation (Signed)
Occupational Therapy Evaluation Patient Details Name: Pamela [name] MRN: [id number] DOB: [birth year] Today's Date: 03/01/2023   History of Present Illness Patient is a72 y.o. female with PMH of  COPD on 2L oxygen via [place], heart failure with preserved EF, HTN, morbid obesity, sarcoidosis, lung cancer adenocarcinoma in 2012 in remission as reviewed from EMR, presented at [facility] long ED due to 4 days of hypoxic respiratory failure, and dyspnea due to exacerbation of COPD.  Pt admitted 02/23/23 for Acute on chronic hypoxic and hypercapnic respiratory failure due to COPD exacerbation and acute on chronic diastolic congestive heart failure   Clinical Impression   Patient is a 72 year old female who was admitted for above. Patient was living at home independently with family support. Currently, patient was mod A for LB dressing tasks and min guard for functional mobility. Patient plans to transition home with family support and [facility] services. Patient is motivated to transition home.   Patient would continue to benefit from skilled OT services at this time while admitted and after d/c to address noted deficits in order to improve overall safety and independence in ADLs.       If plan is discharge home, recommend the following: A little help with walking and/or transfers;A little help with bathing/dressing/bathroom;Assistance with cooking/housework;Direct supervision/assist for medications management;Assist for transportation;Help with stairs or ramp for entrance;Direct supervision/assist for financial management    Functional Status Assessment  Patient has had a recent decline in their functional status and demonstrates the ability to make significant improvements in function in a reasonable and predictable amount of time.  Equipment Recommendations  None recommended by OT       Precautions / Restrictions Precautions Precautions: Fall Precaution Comments: 2-3L baseline, monitor O2 Restrictions Weight  Bearing Restrictions: No      Mobility Bed Mobility               General bed mobility comments: patient was up in recliner          Balance Overall balance assessment: Needs assistance Sitting-balance support: Bilateral upper extremity supported, Feet supported Sitting balance-Leahy Scale: Fair     Standing balance support: Bilateral upper extremity supported, During functional activity, Reliant on assistive device for balance Standing balance-Leahy Scale: Fair         ADL either performed or assessed with clinical judgement   ADL Overall ADL's : Needs assistance/impaired Eating/Feeding: Modified independent;Sitting   Grooming: Set up;Sitting   Upper Body Bathing: Set up;Sitting   Lower Body Bathing: Moderate assistance;Sitting/lateral leans Lower Body Bathing Details (indicate cue type and reason): assumed body habitus Upper Body Dressing : Set up;Sitting   Lower Body Dressing: Moderate assistance;Sitting/lateral leans;Sit to/from stand   Toilet Transfer: Software engineer Details (indicate cue type and reason): patient was able to participate in mobility from recliner next to bed to end of ICU hallway and back. patient noted to have coughing spell about half way with ability to maintain standing balance and O2 above 93% on 3L/min with walking. patient noted to have a few moments of O2 of 88% and RR up to 28. Toileting- Clothing Manipulation and Hygiene: Minimal assistance;Sit to/from stand               Vision Baseline Vision/History: 1 Wears glasses Vision Assessment?: No apparent visual deficits            Pertinent Vitals/Pain Pain Assessment Pain Assessment: No/denies pain     Extremity/Trunk Assessment Upper Extremity Assessment Upper Extremity Assessment: Overall  WFL for tasks assessed   Lower Extremity Assessment Lower Extremity Assessment: Defer to PT evaluation   Cervical / Trunk Assessment Cervical / Trunk  Assessment: Other exceptions Cervical / Trunk Exceptions: body habitus   Communication Communication Communication: No apparent difficulties   Cognition Arousal: Alert Behavior During Therapy: WFL for tasks assessed/performed, Restless Overall Cognitive Status: Within Functional Limits for tasks assessed       General Comments: continuos coughing                Home Living Family/patient expects to be discharged to:: Private residence Living Arrangements: Children;Other relatives Available Help at Discharge: Available 24 hours/day Type of Home: House Home Access: Stairs to enter [employer] of Steps: 1 Entrance Stairs-Rails: Right;Left Home Layout: Able to live on main level with bedroom/bathroom     Bathroom Shower/Tub: Arts development officer Toilet: Handicapped height     Home Equipment: Cane - single Librarian, academic (2 wheels);Rollator (4 wheels)   Additional Comments: son and family live on second floor, ,  grand kids take  it up and down steps for  her      Prior Functioning/Environment Prior Level of Function : Independent/Modified Independent             Mobility Comments: pt using SPC in the home or rollator in the home depending on fatigue, ind with in/out of bed and lift chair ADLs Comments: pt reports ind with self care, shares chores with son        OT Problem List: Decreased activity tolerance;Impaired balance (sitting and/or standing);Decreased coordination;Decreased safety awareness;Decreased knowledge of precautions;Decreased knowledge of use of DME or AE;Cardiopulmonary status limiting activity      OT Treatment/Interventions: Self-care/ADL training;Energy conservation;Therapeutic exercise;DME and/or AE instruction;Therapeutic activities;Patient/family education;Balance training    OT Goals(Current goals can be found in the care plan section) Acute Rehab OT Goals Patient Stated Goal: to go home OT Goal Formulation: With  patient Time For Goal Achievement: 03/15/23 Potential to Achieve Goals: Fair  OT Frequency: Min 1X/week       AM-PAC OT "6 Clicks" Daily Activity     Outcome Measure Help from another person eating meals?: A Little Help from another person taking care of personal grooming?: A Little Help from another person toileting, which includes using toliet, bedpan, or urinal?: A Lot Help from another person bathing (including washing, rinsing, drying)?: A Lot Help from another person to put on and taking off regular upper body clothing?: A Little Help from another person to put on and taking off regular lower body clothing?: A Lot 6 Click Score: 15   End of Session Equipment Utilized During Treatment: Rolling walker (2 wheels);Oxygen Nurse Communication: Mobility status  Activity Tolerance: Patient tolerated treatment well Patient left: in chair;with call bell/phone within reach;with chair alarm set  OT Visit Diagnosis: Unsteadiness on feet (R26.81);Other abnormalities of gait and mobility (R26.89);Repeated falls (R29.6)                Time: 1610-9604 OT Time Calculation (min): 25 min Charges:  OT General Charges $OT Visit: 1 Visit OT Evaluation $OT Eval Low Complexity: 1 Low OT Treatments $Self Care/Home Management : 8-22 mins  Rosalio Loud, MS Acute Rehabilitation Department Office# [phone]   Selinda Flavin 03/01/2023, 5:14 PM

## 2023-03-01 NOTE — Progress Notes (Signed)
   03/01/23 0417  BiPAP/CPAP/SIPAP  BiPAP/CPAP/SIPAP Pt Type Adult  BiPAP/CPAP/SIPAP V60  Mask Type Full face mask  Mask Size Medium  Set Rate 14 breaths/min  Respiratory Rate 20 breaths/min  IPAP 18 cmH20  EPAP 8 cmH2O  FiO2 (%) 35 %  Minute Ventilation 9  Leak 0  Peak Inspiratory Pressure (PIP) 18  Tidal Volume (Vt) 498  Patient Home Equipment No  Auto Titrate No  Press High Alarm 35 cmH2O  Press Low Alarm 5 cmH2O  Oxygen Percent 35 %  BiPAP/CPAP /SiPAP Vitals  Pulse Rate 71  Resp 18  BP (!) 153/73  SpO2 96 %  Bilateral Breath Sounds Diminished  MEWS Score/Color  MEWS Score 0  MEWS Score Color [place] Si

## 2023-03-02 DIAGNOSIS — J9612 Chronic respiratory failure with hypercapnia: Secondary | ICD-10-CM | POA: Diagnosis not present

## 2023-03-02 DIAGNOSIS — J9602 Acute respiratory failure with hypercapnia: Secondary | ICD-10-CM | POA: Diagnosis not present

## 2023-03-02 LAB — BASIC METABOLIC PANEL
Anion gap: 12 (ref 5–15)
BUN: 71 mg/dL — ABNORMAL HIGH (ref 8–23)
CO2: 36 mmol/L — ABNORMAL HIGH (ref 22–32)
Calcium: 8.2 mg/dL — ABNORMAL LOW (ref 8.9–10.3)
Chloride: 88 mmol/L — ABNORMAL LOW (ref 98–111)
Creatinine, Ser: 1.69 mg/dL — ABNORMAL HIGH (ref 0.44–1.00)
GFR, Estimated: 32 mL/min — ABNORMAL LOW (ref >60)
Glucose, Bld: 116 mg/dL — ABNORMAL HIGH (ref 70–99)
Potassium: 3.7 mmol/L (ref 3.5–5.1)
Sodium: 136 mmol/L (ref 135–145)

## 2023-03-02 NOTE — Progress Notes (Signed)
[facility] Progress Note Patient: Pamela [name] HYQ:[id number] DOB: 08/11/1950 DOA: 02/23/2023  DOS: the patient was seen and examined on 03/02/2023  Brief hospital course: Pamela [name] is a 72 y.o. female with PMH of  COPD on 2L ox via [place], heart failure with preserved EF, HTN, morbid obesity, sarcoidosis, lung cancer adenocarcinoma in 2012 in remission as reviewed from EMR, presented at [facility] long ED due to 4 days of hypoxic respiratory failure, and dyspnea due to exacerbation of COPD.   Currently being treated for COPD exacerbation with antibiotics and steroids.  Also being diuresed.  Assessment and Plan: Acute on chronic hypoxic and hypercapnic respiratory failure due to COPD exacerbation Chest x-ray with no acute abnormality, no fevers or cytosis, so low suspicion for acute bacterial infection.  COVID-negative, RVP negative Continue nebulizer therapy. Continue doxycycline. Continue Solu-Medrol.  Will attempt to see if a higher dose of Solu-Medrol will help with the wheeze. Continue suppressive medicines for cough. Continue incentive spirometry and flutter valve Continue BiPAP as needed and nightly. Pulmonary consult appreciated. Recommending NIV on discharge.  TOC consulted, and again on 8/12. Patient agreeable to use NIV on discharge.  Acute on chronic diastolic CHF. Initially was on oral Lasix, now to torsemide. Echocardiogram in January 2024 shows EF of 60-65%. Continue holding diuresis.  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   Appears to be trending up.  Holding diuresis.   GERD Protonix p.o.  Bilateral knee pain. Voltaren gel   HLD. Continue statin.   Prediabetes. Hemoglobin A1c appears to be well-controlled. Monitor.   Class III obesity. Body mass index is 54.04 kg/m.  At risk for obesity hypoventilation syndrome. Recommend NIV on discharge. Placing the patient at high risk for poor  outcome.  Mild hyperkalemia. Monitoring for now.  Microcytic anemia. Hemoglobin around 11.  Iron level stable.  Thrombocytopenia. Appears to be acute.  Mild. Appears to be stabilizing now. Lower extremity Dopplers were negative for any DVT. Making consumption etiology less likely.   Subjective: Breathing better.  No nausea no vomiting no fever no chills.  No chest pain.  Swelling in the leg improving.  Physical Exam: General: in Mild distress, No Rash Cardiovascular: S1 and S2 Present, No Murmur Respiratory: Good respiratory effort, Bilateral Air entry present. No Crackles, No wheezes Abdomen: Bowel Sound present, No tenderness Extremities: Bilateral edema Neuro: Alert and oriented x3, no new focal deficit  Data Reviewed: I have Reviewed nursing notes, Vitals, and Lab results. Since last encounter, pertinent lab results BMP   . I have ordered test including BMP magnesium  .   Disposition: Status is: Inpatient Remains inpatient appropriate because: Needing diuresis  SCDs Start: 02/23/23 1507   Family Communication: No one at bedside Level of care: Progressive   Vitals:   03/01/23 2242 03/02/23 0911 03/02/23 1405 03/02/23 1458  BP: 120/68   112/66  Pulse: 70   85  Resp: 18   20  Temp: 98.2 F (36.8 C)   98.3 F (36.8 C)  TempSrc: Oral   Oral  SpO2: 100% 95% 98% 94%  Weight:      Height:         Author: Lynden Oxford, MD 03/02/2023 6:45 PM  Please look on [email or web address] to find out who is on call.

## 2023-03-02 NOTE — Progress Notes (Signed)
Physical Therapy Treatment Patient Details Name: Pamela [name] MRN: [id number] DOB: 1950-10-19 Today's Date: 03/02/2023   History of Present Illness Patient is a72 y.o. female with PMH of  COPD on 2L oxygen via [place], heart failure with preserved EF, HTN, morbid obesity, sarcoidosis, lung cancer adenocarcinoma in 2012 in remission as reviewed from EMR, presented at [facility] long ED due to 4 days of hypoxic respiratory failure, and dyspnea due to exacerbation of COPD.  Pt admitted 02/23/23 for Acute on chronic hypoxic and hypercapnic respiratory failure due to COPD exacerbation and acute on chronic diastolic congestive heart failure    PT Comments  Pt agreeable to working with therapy. O2 96% on 3L with ambulation. Pt tolerated activity well.     If plan is discharge home, recommend the following: A little help with walking and/or transfers;A little help with bathing/dressing/bathroom;Assist for transportation;Help with stairs or ramp for entrance;Assistance with cooking/housework   Can travel by private vehicle        Equipment Recommendations  None recommended by PT    Recommendations for Other Services       Precautions / Restrictions Precautions Precautions: Fall Precaution Comments: 2-3L baseline, monitor O2 Restrictions Weight Bearing Restrictions: No     Mobility  Bed Mobility               General bed mobility comments: patient was up in recliner    Transfers Overall transfer level: Needs assistance Equipment used: Rolling walker (2 wheels)   Sit to Stand: Supervision                Ambulation/Gait Ambulation/Gait assistance: Supervision Gait Distance (Feet): 200 Feet Assistive device: Rolling walker (2 wheels) Gait Pattern/deviations: Step-through pattern, Decreased stride length       General Gait Details: Fair gait speed. Dyspnea 2/4. O2 96% on 3L. Pt tolerated distance well. 2 brief standing rest breaks taken   Stairs              Wheelchair Mobility     Tilt Bed    Modified Rankin (Stroke Patients Only)       Balance Overall balance assessment: Mild deficits observed, not formally tested                                          Cognition Arousal: Alert Behavior During Therapy: WFL for tasks assessed/performed Overall Cognitive Status: Within Functional Limits for tasks assessed                                          Exercises      General Comments        Pertinent Vitals/Pain Pain Assessment Pain Assessment: No/denies pain    Home Living                          Prior Function            PT Goals (current goals can now be found in the care plan section) Progress towards PT goals: Progressing toward goals    Frequency    Min 1X/week      PT Plan      Co-evaluation              AM-PAC PT "6 Clicks" Mobility  Outcome Measure  Help needed turning from your back to your side while in a flat bed without using bedrails?: A Little Help needed moving from lying on your back to sitting on the side of a flat bed without using bedrails?: A Little Help needed moving to and from a bed to a chair (including a wheelchair)?: A Little Help needed standing up from a chair using your arms (e.g., wheelchair or bedside chair)?: A Little Help needed to walk in hospital room?: A Little Help needed climbing 3-5 steps with a railing? : A Lot 6 Click Score: 17    End of Session Equipment Utilized During Treatment: Oxygen Activity Tolerance: Patient tolerated treatment well Patient left: in chair;with call bell/phone within reach   PT Visit Diagnosis: Difficulty in walking, not elsewhere classified (R26.2)     Time: 1610-9604 PT Time Calculation (min) (ACUTE ONLY): 19 min  Charges:    $Gait Training: 8-22 mins PT General Charges $$ ACUTE PT VISIT: 1 Visit                         Faye Ramsay, PT Acute Rehabilitation  Office:  [phone]

## 2023-03-02 NOTE — Progress Notes (Signed)
PT demonstrated hands on understanding of IS device. Keeping indicator within 2 blue arrows and achieving approx 700 mLs.

## 2023-03-02 NOTE — Plan of Care (Signed)
  Problem: Education: Goal: Knowledge of General Education information will improve Description: Including pain rating scale, medication(s)/side effects and non-pharmacologic comfort measures Outcome: Progressing   Problem: Clinical Measurements: Goal: Ability to maintain clinical measurements within normal limits will improve Outcome: Progressing   Problem: Activity: Goal: Risk for activity intolerance will decrease Outcome: Progressing   Problem: Coping: Goal: Level of anxiety will decrease Outcome: Progressing   Problem: Pain Managment: Goal: General experience of comfort will improve Outcome: Progressing   Problem: Safety: Goal: Ability to remain free from injury will improve Outcome: Progressing   Problem: Skin Integrity: Goal: Risk for impaired skin integrity will decrease Outcome: Progressing

## 2023-03-02 NOTE — TOC Initial Note (Signed)
Transition of Care [facility]) - Initial/Assessment Note    Patient Details  Name: Pamela [name] MRN: [id number] Date of Birth: 1951/04/26  Transition of Care [facility]) CM/SW Contact:    Larrie Kass, LCSW Phone Number: 03/02/2023, 3:45 PM  Clinical Narrative:                 CSW received a consult for NIV/Trilogy. CSW spoke with pt she received home O2 through [employer]. CSW sent a referral out to [facility] with [employer], he is following up. CSW discussed rec for Home Health services, pt has agreed and stated she has had [facility] in the past and would like that agency again. [facility] accepted pt for HHPT/OT. Pt reports that she has no other DME need. Pt reports her son can provided transportation at the time of d/c if he is not at work.TOC to follow.   Expected Discharge Plan: Home w Home Health Services Barriers to Discharge: Continued Medical Work up   Patient Goals and CMS Choice Patient states their goals for this hospitalization and ongoing recovery are:: return home          Expected Discharge Plan and Services In-house Referral: Clinical Social Work     Living arrangements for the past 2 months: Single Family Home                 DME Arranged:  (Trilogy/ NIV) DME Agency: [employer] Date DME Agency Contacted: 03/02/23 Time DME Agency Contacted: 1520   HH Arranged: PT, OT HH Agency: [facility] Date HH Agency Contacted: 03/02/23 Time HH Agency Contacted: 1544 Representative spoke with at [facility] Agency: kelly  Prior Living Arrangements/Services Living arrangements for the past 2 months: Single Family Home Lives with:: Self Patient language and need for interpreter reviewed:: Yes Do you feel safe going back to the place where you live?: Yes      Need for Family Participation in Patient Care: No (Comment) Care giver support system in place?: No (comment) Current home services: DME Criminal Activity/Legal Involvement Pertinent to Current  Situation/Hospitalization: No - Comment as needed  Activities of Daily Living Home Assistive Devices/Equipment: Dentures (specify type), Eyeglasses, Oxygen, Walker (specify type), Cane (specify quad or straight) ADL Screening (condition at time of admission) Patient's cognitive ability adequate to safely complete daily activities?: Yes Is the patient deaf or have difficulty hearing?: No Does the patient have difficulty seeing, even when wearing glasses/contacts?: No Does the patient have difficulty concentrating, remembering, or making decisions?: No Patient able to express need for assistance with ADLs?: Yes Does the patient have difficulty dressing or bathing?: No Independently performs ADLs?: Yes (appropriate for developmental age) Does the patient have difficulty walking or climbing stairs?: Yes Weakness of Legs: None Weakness of Arms/Hands: None  Permission Sought/Granted                  Emotional Assessment Appearance:: Appears stated age Attitude/Demeanor/Rapport: Gracious Affect (typically observed): Accepting Orientation: : Oriented to Self, Oriented to Place, Oriented to  Time, Oriented to Situation      Admission diagnosis:  Acute on chronic respiratory failure with hypoxia and hypercapnia (HCC) [D66.44, J96.22] Severe asthma with exacerbation, unspecified whether persistent [J45.901] Acute on chronic respiratory acidosis (HCC) [J96.02, J96.12] Patient Active Problem List   Diagnosis Date Noted   Chronic kidney disease 02/25/2023   Severe asthma with exacerbation 02/25/2023   Moderate persistent asthma with acute exacerbation 02/24/2023   Tracheomalacia 02/24/2023   Acute on chronic respiratory failure with hypoxia (HCC)  02/24/2023   Acute on chronic respiratory acidosis (HCC) 02/23/2023   Vocal cord dysfunction 01/29/2023   Tracheomalacia, 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   Severe persistent chronic asthma without complication 09/08/2022   Sickle-cell trait (HCC)  09/08/2022   Acute respiratory failure with hypercapnia (HCC) 08/17/2022   COPD exacerbation (HCC) 04/04/2022   Vitamin D deficiency 02/02/2022   Stage 3b chronic kidney disease (HCC) 09/24/2020   Malignant neoplasm of lung (HCC) 09/24/2020   Aortic atherosclerosis (HCC) 09/24/2020   PFO (patent foramen ovale) 09/24/2020   History of stroke 09/24/2020   Acute respiratory failure with hypoxia (HCC) 12/17/2019   COPD with acute exacerbation (HCC) 12/17/2019   COVID-19 virus infection 01/02/2019   Right knee pain 09/15/2018   Closed head injury with concussion 09/06/2018   Hypokalemia 09/06/2018   Hypomagnesemia 09/06/2018   Asthma, chronic obstructive, with acute exacerbation (HCC) 08/02/2018   Plantar fasciitis 04/14/2017   Microcytosis 11/28/2015   Solitary kidney 07/18/2015   Onychomycosis 01/09/2015   Ingrown nail 01/09/2015   Pain in lower limb 01/09/2015   Chronic respiratory failure with hypoxia and hypercapnia (HCC) 07/19/2014   Acute on chronic respiratory failure with hypoxia and hypercapnia (HCC) 06/24/2014   Anemia, iron deficiency 05/27/2014   Acute gouty arthritis 05/25/2014   Obstructive sleep apnea 05/25/2014   Joint pain 05/24/2014   Chronic diastolic CHF (congestive heart failure) (HCC)    Sarcoidosis    Metabolic syndrome 05/21/2014   Asthma, chronic 04/27/2014   Anemia of chronic disease 04/19/2014   Morbid obesity (HCC) 04/18/2014   Immunization due 04/18/2014   Need for prophylactic vaccination and inoculation against influenza 04/18/2014   Prediabetes 04/13/2014   Essential hypertension 04/13/2014   Lower extremity edema 04/13/2014   History of lung cancer 04/13/2014   PCP:  Massie Maroon, FNP Pharmacy:   [facility] Delivery - [place], [place] - [address] [place] [address] Phone: [phone] Fax: [phone]  [facility] [address] [place]), [place] - [address] [name] [address] Phone: [phone] Fax: [phone]     Social Determinants of Health (SDOH) Social History: SDOH Screenings   Food Insecurity: No Food Insecurity (02/24/2023)  Housing: Low Risk  (02/24/2023)  Transportation Needs: No Transportation Needs (02/24/2023)  Utilities: Not At Risk (02/24/2023)  Alcohol Screen: Low Risk  (07/15/2022)  Depression (PHQ2-9): Low Risk  (01/26/2023)  Financial Resource Strain: Low Risk  (07/15/2022)  Physical Activity: Inactive (07/15/2022)  Social Connections: Moderately Integrated (07/15/2022)  Stress: No Stress Concern Present (07/15/2022)  Tobacco Use: Low Risk  (02/23/2023)   SDOH Interventions:     Readmission Risk Interventions    02/26/2023    6:27 PM 04/06/2022   12:12 PM  Readmission Risk Prevention Plan  Transportation Screening Complete Complete  PCP or Specialist Appt within 5-7 Days  Complete  Home Care Screening  Complete  Medication Review (RN CM)  Complete  Medication Review (RN Care Manager) Complete   PCP or Specialist appointment within 3-5 days of discharge Complete   HRI or Home Care Consult Complete   SW Recovery Care/Counseling Consult Complete   Palliative Care Screening Not Applicable   Skilled Nursing Facility Not Applicable

## 2023-03-02 NOTE — Progress Notes (Signed)
   03/02/23 2237  BiPAP/CPAP/SIPAP  BiPAP/CPAP/SIPAP Pt Type Adult  BiPAP/CPAP/SIPAP V60  Mask Type Full face mask  Mask Size Medium  Set Rate 14 breaths/min  Respiratory Rate 22 breaths/min  IPAP 18 cmH20  EPAP 8 cmH2O  FiO2 (%) 35 %  Minute Ventilation 16.1  Leak 14  Peak Inspiratory Pressure (PIP) 19  Tidal Volume (Vt) 671  Patient Home Equipment No  Auto Titrate No  Press High Alarm 35 cmH2O  Press Low Alarm 5 cmH2O  CPAP/SIPAP surface wiped down Yes  Oxygen Percent 35 %  BiPAP/CPAP /SiPAP Vitals  Pulse Rate 83  Resp (!) 22  SpO2 98 %  Bilateral Breath Sounds Diminished  MEWS Score/Color  MEWS Score 1  MEWS Score Color Green   Mepilex placed on bridge of nose due to skin breakdown from mask usage.

## 2023-03-02 NOTE — Care Management Important Message (Signed)
Important Message  Patient Details IM Letter given. Name: Pamela [name] MRN: [id number] Date of Birth: 06-13-1951   Medicare Important Message Given:  Yes     Caren Macadam 03/02/2023, 11:53 AM

## 2023-03-02 NOTE — Plan of Care (Signed)

## 2023-03-03 DIAGNOSIS — J9602 Acute respiratory failure with hypercapnia: Secondary | ICD-10-CM | POA: Diagnosis not present

## 2023-03-03 DIAGNOSIS — J9612 Chronic respiratory failure with hypercapnia: Secondary | ICD-10-CM | POA: Diagnosis not present

## 2023-03-03 LAB — MAGNESIUM: Magnesium: 1.6 mg/dL — ABNORMAL LOW (ref 1.7–2.4)

## 2023-03-03 LAB — BASIC METABOLIC PANEL
Anion gap: 12 (ref 5–15)
BUN: 69 mg/dL — ABNORMAL HIGH (ref 8–23)
CO2: 37 mmol/L — ABNORMAL HIGH (ref 22–32)
Calcium: 8 mg/dL — ABNORMAL LOW (ref 8.9–10.3)
Chloride: 85 mmol/L — ABNORMAL LOW (ref 98–111)
Creatinine, Ser: 1.27 mg/dL — ABNORMAL HIGH (ref 0.44–1.00)
GFR, Estimated: 45 mL/min — ABNORMAL LOW (ref >60)
Glucose, Bld: 85 mg/dL (ref 70–99)
Potassium: 3.4 mmol/L — ABNORMAL LOW (ref 3.5–5.1)
Sodium: 134 mmol/L — ABNORMAL LOW (ref 135–145)

## 2023-03-03 MED ORDER — PREDNISONE 10 MG PO TABS: ORAL_TABLET | ORAL | 0 refills | Status: DC

## 2023-03-03 MED ORDER — DEXTROMETHORPHAN POLISTIREX ER 30 MG/5ML PO SUER: 30.0000 mg | Freq: Two times a day (BID) | ORAL | 0 refills | Status: DC

## 2023-03-03 MED ORDER — B COMPLEX-C PO TABS: 1.0000 | ORAL_TABLET | Freq: Every day | ORAL | 0 refills | Status: AC

## 2023-03-03 MED ORDER — MAGNESIUM SULFATE 2 GM/50ML IV SOLN
2.0000 g | Freq: Once | INTRAVENOUS | Status: AC
  Administered 2023-03-03: 2 g via INTRAVENOUS
  Filled 2023-03-03: qty 50

## 2023-03-03 MED ORDER — HYDROXYZINE HCL 25 MG PO TABS: 25.0000 mg | ORAL_TABLET | Freq: Three times a day (TID) | ORAL | 0 refills | Status: DC | PRN

## 2023-03-03 MED ORDER — BENZONATATE 100 MG PO CAPS: 100.0000 mg | ORAL_CAPSULE | Freq: Three times a day (TID) | ORAL | 0 refills | Status: DC

## 2023-03-03 MED ORDER — GUAIFENESIN ER 600 MG PO TB12: 600.0000 mg | ORAL_TABLET | Freq: Two times a day (BID) | ORAL | 0 refills | Status: DC

## 2023-03-03 MED ORDER — FOLIC ACID 1 MG PO TABS: 1.0000 mg | ORAL_TABLET | Freq: Every day | ORAL | 0 refills | Status: AC

## 2023-03-03 MED ORDER — DICLOFENAC SODIUM 1 % EX GEL: 2.0000 g | Freq: Four times a day (QID) | CUTANEOUS | 0 refills | Status: AC

## 2023-03-03 NOTE — Progress Notes (Signed)
[facility] Patient: Pamela [name] ZOX:[id number]   PCP: Massie Maroon, FNP DOB: March 17, 1951   DOA: 02/23/2023   DOS: 03/03/2023    Patient's chronic respiratory failure due to tracheomalacia, sarcoidosis, asthma, which is life threatening. Previous ABG's have documented high PCO2. ABG    Component Value Date/Time   PHART 7.25 (L) 02/24/2023 0803   PCO2ART 103 (HH) 02/24/2023 0803   PO2ART 102 02/24/2023 0803   HCO3 45.2 (H) 02/24/2023 0803   TCO2 30 09/13/2018 0843   O2SAT 99.8 02/24/2023 0803  Spirometry reveals Moderately severe Restriction with low ERV. Patient would benefit from non-invasive ventilation. Patient would benefit from NIV therapy with set tidal volumes and pressure. Without this therapy, the patient is at high risk of ending up with worsening symptoms, worsened respiratory failure, need for frequent ER visits and/or recurrent hospitalizations.   Bilevel device unable to adequately support patient's nocturnal ventilation needs.  Current setting in hospital.    03/02/23 2237   BiPAP/CPAP/SIPAP  BiPAP/CPAP/SIPAP Pt Type Adult  BiPAP/CPAP/SIPAP V60  Mask Type Full face mask  Mask Size Medium  Set Rate 14 breaths/min  Respiratory Rate 22 breaths/min  IPAP 18 cmH20  EPAP 8 cmH2O  FiO2 (%) 35 %  Minute Ventilation 16.1  Leak 14  Peak Inspiratory Pressure (PIP) 19  Tidal Volume (Vt) 671  Patient Home Equipment No  Auto Titrate No  Press High Alarm 35 cmH2O  Press Low Alarm 5 cmH2O  CPAP/SIPAP surface wiped down Yes  Oxygen Percent 35 %  BiPAP/CPAP /SiPAP Vitals  Pulse Rate 83  Resp (!) 22  SpO2 98 %  Bilateral Breath Sounds Diminished  MEWS Score/Color  MEWS Score 1  MEWS Score Color Green    Mepilex placed on bridge of nose due to skin breakdown from mask usage.   Author: Lynden Oxford, MD [facility] 03/03/2023 10:47 AM   If 7PM-7AM, please contact night-coverage at [email or web address]

## 2023-03-03 NOTE — Plan of Care (Signed)

## 2023-03-03 NOTE — Progress Notes (Signed)
Occupational Therapy Treatment and Discharge Patient Details Name: Pamela [name] MRN: [id number] DOB: 11/14/1950 Today's Date: 03/03/2023   History of present illness Patient is a72 y.o. female with PMH of  COPD on 2L oxygen via [place], heart failure with preserved EF, HTN, morbid obesity, sarcoidosis, lung cancer adenocarcinoma in 2012 in remission as reviewed from EMR, presented at [facility] long ED due to 4 days of hypoxic respiratory failure, and dyspnea due to exacerbation of COPD.  Pt admitted 02/23/23 for Acute on chronic hypoxic and hypercapnic respiratory failure due to COPD exacerbation and acute on chronic diastolic congestive heart failure   OT comments  This 72 yo female seen today to look at any issues she may still be having with ADLs. Pt is overall at a Mod I level in her hospital room with RW and managing her own O2 tubing. She reports she feels overall back to her baseline. She does have family to A prn. No further acute OT needs, but HHOT would be beneficial to make sure she is all good from an ADL and IADL perspective in her own environment. Acute OT will sign off.      If plan is discharge home, recommend the following:  Assistance with cooking/housework;Help with stairs or ramp for entrance;Assist for transportation (A with IADLs)   Equipment Recommendations  None recommended by OT       Precautions / Restrictions Precautions Precautions: Fall Precaution Comments: 3 liters at baseline, monitor O2 (96-97% on 3 liters with HR in upper 70's during session todasy) Restrictions Weight Bearing Restrictions: No       Mobility Bed Mobility Overal bed mobility: Modified Independent Bed Mobility: Sit to Supine           General bed mobility comments: increased time    Transfers Overall transfer level: Modified independent Equipment used: Rolling walker (2 wheels) Transfers: Sit to/from Stand Sit to Stand: Modified independent (Device/Increase time)            General transfer comment: pt uses rocking momentum to get up from bed     Balance Overall balance assessment: Mild deficits observed, not formally tested                                         ADL either performed or assessed with clinical judgement   ADL Overall ADL's : Modified independent                     Lower Body Dressing: Modified independent;Sit to/from stand Lower Body Dressing Details (indicate cue type and reason): with and without AD; demonstrated to her and she return demonstrated use of reacher to take off socks (she has reacher and sock aid at home) Toilet Transfer: Modified Independent;Ambulation;Rolling walker (2 wheels) Toilet Transfer Details (indicate cue type and reason): simulated bed>around room (managing her own O2 tubing)>sit in recliner           General ADL Comments: able to tolerate 10 minutes of activity (sitting, standing, and ambulation)    Extremity/Trunk Assessment Upper Extremity Assessment Upper Extremity Assessment: Overall WFL for tasks assessed            Vision Patient Visual Report: No change from baseline            Cognition Arousal: Alert Behavior During Therapy: WFL for tasks assessed/performed Overall Cognitive Status: Within Functional Limits for tasks assessed  Pertinent Vitals/ Pain       Pain Assessment Pain Assessment: No/denies pain         Frequency  Min 1X/week        Progress Toward Goals  OT Goals(current goals can now be found in the care plan section)  Progress towards OT goals: Goals met and updated - see care plan  Acute Rehab OT Goals Patient Stated Goal: to go home         AM-PAC OT "6 Clicks" Daily Activity     Outcome Measure   Help from another person eating meals?: None Help from another person taking care of personal grooming?: None Help from another person toileting, which  includes using toliet, bedpan, or urinal?: None Help from another person bathing (including washing, rinsing, drying)?: None Help from another person to put on and taking off regular upper body clothing?: None Help from another person to put on and taking off regular lower body clothing?: None 6 Click Score: 24    End of Session Equipment Utilized During Treatment: Rolling walker (2 wheels);Oxygen (3 liters)  OT Visit Diagnosis: Unsteadiness on feet (R26.81)   Activity Tolerance Patient tolerated treatment well   Patient Left in chair;with call bell/phone within reach;with chair alarm set           Time: 1027-2536 OT Time Calculation (min): 27 min  Charges: OT General Charges $OT Visit: 1 Visit OT Treatments $Self Care/Home Management : 23-37 mins  Lindon Romp OT Acute Rehabilitation Services Office [phone]    Evette Georges 03/03/2023, 8:59 AM

## 2023-03-03 NOTE — Plan of Care (Signed)
  Problem: Education: Goal: Knowledge of General Education information will improve Description: Including pain rating scale, medication(s)/side effects and non-pharmacologic comfort measures Outcome: Progressing   Problem: Health Behavior/Discharge Planning: Goal: Ability to manage health-related needs will improve Outcome: Progressing   Problem: Clinical Measurements: Goal: Ability to maintain clinical measurements within normal limits will improve Outcome: Progressing   Problem: Activity: Goal: Risk for activity intolerance will decrease Outcome: Progressing   Problem: Coping: Goal: Level of anxiety will decrease Outcome: Progressing   Problem: Elimination: Goal: Will not experience complications related to bowel motility Outcome: Progressing Goal: Will not experience complications related to urinary retention Outcome: Progressing   Problem: Pain Managment: Goal: General experience of comfort will improve Outcome: Progressing   Problem: Safety: Goal: Ability to remain free from injury will improve Outcome: Progressing   Problem: Skin Integrity: Goal: Risk for impaired skin integrity will decrease Outcome: Progressing   

## 2023-03-03 NOTE — Progress Notes (Signed)
Patient had a one time order of IVPB 2 g 50 mL Magnesium Sulfate prior to discharge. Patient was only able to get one-forth (25%) of it before patient's IV started burning. RN tried to decrease the rate of the IV med, but patient was still complaining of it burning. RN tried to y in the IV Magnesium Sulfate with IV 0.9% NaCl; however, when RN tried to flush the patient's IV it was leaking. RN notified the MD and MD told RN that patient was okay to go home.   Patient received discharge orders to go home with home health. Patient was given discharge paperwork/instructions. RN went over discharge paperwork/instructions with the patient. All questions/concerns were answered/addressed to the best of the RN's ability. Patient left the hospital stable with oxygen, had discharge paperwork/instructions, and had all personal belongings.

## 2023-03-03 NOTE — TOC Progression Note (Addendum)
Transition of Care [facility]) - Progression Note    Patient Details  Name: Pamela [name] MRN: [id number] Date of Birth: 15-Sep-1950  [facility] CM/SW Contact  Larrie Kass, LCSW Phone Number: 03/03/2023, 9:45 AM  Clinical Narrative:    Csw spoke to [facility] with [employer], he stated MD will need to put a note in pt's chart ruling out a Bipap. CSW emailed the signed order form for NIV back to the [employer] rep. Pt's NIV will be set up at her home. Pt's HH is arranged through [facility].    [place]  1:20pm Spoke with Mitch from [employer], and he reports that after reviewing pt's insurance, pt auth for the NIV will take a couple of weeks for approval. Marthann Schiller has sent new orders for pt to receive a Bipap as she waits for insurance approval for NIV. Signed orders have been emailed back to the [employer] rep.  3:30pm Received a call from Mitch pt's Bipap is scheduled to be delivered at 4:30pm to pt's home. Pt was made aware and agreeable with plan. TOC sign off  Expected Discharge Plan: Home w Home Health Services Barriers to Discharge: Continued Medical Work up  Expected Discharge Plan and Services In-house Referral: Clinical Social Work     Living arrangements for the past 2 months: Single Family Home                 DME Arranged:  ([facility]/ NIV) DME Agency: [employer] Date DME Agency Contacted: 03/02/23 Time DME Agency Contacted: [phone]   HH Arranged: PT, OT HH Agency: [facility] Date HH Agency Contacted: 03/02/23 Time HH Agency Contacted: 1544 Representative spoke with at [facility] Agency: kelly   Social Determinants of Health (SDOH) Interventions SDOH Screenings   Food Insecurity: No Food Insecurity (02/24/2023)  Housing: Low Risk  (02/24/2023)  Transportation Needs: No Transportation Needs (02/24/2023)  Utilities: Not At Risk (02/24/2023)  Alcohol Screen: Low Risk  (07/15/2022)  Depression (PHQ2-9): Low Risk  (01/26/2023)  Financial Resource Strain: Low Risk   (07/15/2022)  Physical Activity: Inactive (07/15/2022)  Social Connections: Moderately Integrated (07/15/2022)  Stress: No Stress Concern Present (07/15/2022)  Tobacco Use: Low Risk  (02/23/2023)    Readmission Risk Interventions    02/26/2023    6:27 PM 04/06/2022   12:12 PM  Readmission Risk Prevention Plan  Transportation Screening Complete Complete  PCP or Specialist Appt within 5-7 Days  Complete  Home Care Screening  Complete  Medication Review (RN CM)  Complete  Medication Review (RN Care Manager) Complete   PCP or Specialist appointment within 3-5 days of discharge Complete   HRI or Home Care Consult Complete   SW Recovery Care/Counseling Consult Complete   Palliative Care Screening Not Applicable   Skilled Nursing Facility Not Applicable

## 2023-03-03 NOTE — Consult Note (Signed)
   [facility] CM Inpatient Consult   03/03/2023  [name] 1950/11/03 [id number]  [facility] [[facility]]  Accountable Care Organization [ACO] Patient: Pamela [name] Medicare   [facility] Liaison remote review for patient at [facility]  Primary Care Provider: Massie Maroon, FNP with [facility]  Patient reviewed for LLOS 8 days with extreme high risk score for unplanned readmission risk with Chronic disease  Patient evaluated for community based chronic complex disease management services with [facility] Care Management Program as a benefit of patient's Medicare Insurance.   Patient will receive post hospital discharge call and will be evaluated for ongoing post hospital care coordination needs. Patient with a history of RN Care Coordination.  Spoke with patient via hospital bedside phone to check for post hospital follow up and care coordination needs.  HIPAA verified.  Patient states she would like follow up as she will have a new machine.  Plan:  Will make a referral request for care coordination services.   Of note, [facility] Care Management services does not replace or interfere with any services that are arranged by inpatient case management or social work.  For additional questions or referrals please contact:    Charlesetta Shanks, RN BSN CCM [facility]  [phone] business mobile phone Toll free office [phone]  *Concierge Line  [phone] Fax number: [phone] [email or web address]

## 2023-03-03 NOTE — Progress Notes (Signed)
Mobility Specialist - Progress Note  Pre-mobility: 71 bpm HR, 94% SpO2 During mobility: 103 bpm HR, 94% SpO2 Post-mobility: 93 bpm HR, 95% SPO2   03/03/23 1138  Mobility  Activity Ambulated with assistance in hallway  Level of Assistance Standby assist, set-up cues, supervision of patient - no hands on  Assistive Device Front wheel walker  Distance Ambulated (ft) 350 ft  Range of Motion/Exercises Active  Activity Response Tolerated well  Mobility Referral Yes  $Mobility charge 1 Mobility  Mobility Specialist Start Time (ACUTE ONLY) 1120  Mobility Specialist Stop Time (ACUTE ONLY) 1138  Mobility Specialist Time Calculation (min) (ACUTE ONLY) 18 min   Pt was found on recliner chair and agreeable to ambulate. No complaints with session. At EOS returned to recliner chair with all needs met. Call bell and chair alarm on. RN in room.  Billey Chang Mobility Specialist

## 2023-03-04 ENCOUNTER — Encounter: Payer: Self-pay | Admitting: *Deleted

## 2023-03-04 ENCOUNTER — Telehealth: Payer: Self-pay | Admitting: *Deleted

## 2023-03-04 NOTE — Transitions of Care (Post Inpatient/ED Visit) (Signed)
03/04/2023  Name: Pamela [name] MRN: [id number] DOB: [birth year]  Today's TOC FU Call Status: Today's TOC FU Call Status:: Successful TOC FU Call Completed TOC FU Call Complete Date: 03/04/23  Transition Care Management Follow-up Telephone Call Date of Discharge: 03/03/23 Discharge Facility: [facility]) Type of Discharge: Inpatient Admission Primary Inpatient Discharge Diagnosis:: shortness of breath; chronic respiratory failure, on BiPaP How have you been since you were released from the hospital?: Better ("I am much better-- I slept really good with the new BiPap machine they gave me.  My granddaughter is to pick up al of the new medicine I haven't already gotten, this afternoon.  I will call the home health agency to set up the PT") Any questions or concerns?: No  Items Reviewed: Did you receive and understand the discharge instructions provided?: Yes (thoroughly reviewed with patient who verbalizes good understanding of same) Medications obtained,verified, and reconciled?: Yes (Medications Reviewed) (Full medication reconciliation/ review completed; no concerns or discrepancies identified; confirmed patient obtained/ is taking all newly Rx'd medications as instructed; self-manages medications and denies questions/ concerns around medications today) Any new allergies since your discharge?: No Dietary orders reviewed?: Yes Type of Diet Ordered:: "As healthy as I can" Do you have support at home?: Yes People in Home: child(ren), adult Name of Support/Comfort Primary Source: Reports resides with adult son/ essentially independent in self-care activities; supportive son assists as/ if needed/ indicated  Medications Reviewed Today: Medications Reviewed Today     Reviewed by Michaela Corner, RN (Registered Nurse) on 03/04/23 at 1449  Med List Status: <None>   Medication Order Taking? Sig Documenting Provider Last Dose Status Informant  acetaminophen (TYLENOL) 500 MG tablet  [id number] Yes Take 1,000 mg by mouth every 6 (six) hours as needed for mild pain. [provider] Taking Active Self, Pharmacy Records           Med Note (CRUTHIS, Carlota Raspberry Aug 17, 2022  7:29 AM)    albuterol [facility] HFA) 108 [phone] Base) MCG/ACT inhaler [id number] Yes Inhale 2 puffs into the lungs every 4 (four) hours as needed for wheezing or shortness of breath. Charlott Holler, MD Taking Active Self, Pharmacy Records  albuterol (PROVENTIL) (2.5 MG/3ML) 0.083% nebulizer solution [id number] Yes USE 1 VIAL IN NEBULIZER EVERY 6 HOURS AS NEEDED FOR WHEEZING FOR SHORTNESS OF BREATH Charlott Holler, MD Taking Active Self, Pharmacy Records  aspirin EC 81 MG tablet [id number] Yes Take 1 tablet (81 mg total) by mouth daily. Swallow whole. Christell Constant, MD Taking Active Self, Pharmacy Records  atorvastatin (LIPITOR) 40 MG tablet [id number] Yes Take 1 tablet (40 mg total) by mouth daily. Swinyer, Zachary George, NP Taking Active Self, Pharmacy Records  B Complex-C (B-COMPLEX WITH VITAMIN C) tablet [id number] No Take 1 tablet by mouth daily. Rolly Salter, MD Unknown Active            Med Note Jonnie Kind Mar 04, 2023  2:48 PM) 03/04/23: Reports during [facility] call her granddaughter is to pick up today from local outpatient pharmacy   benzonatate (TESSALON) 100 MG capsule [id number] No Take 1 capsule (100 mg total) by mouth 3 (three) times daily. Rolly Salter, MD Unknown Active            Med Note Jonnie Kind Mar 04, 2023  2:47 PM) 03/04/23: Reports during [facility] call her granddaughter is to pick up today from local outpatient pharmacy  budesonide (  PULMICORT) 0.25 MG/2ML nebulizer solution [id number] Yes One twice daily with albuterol Nyoka Cowden, MD Taking Active Self, Pharmacy Records  calcitRIOL (ROCALTROL) 0.25 MCG capsule [id number] Yes Take 1 capsule (0.25 mcg total) by mouth daily. Massie Maroon, FNP Taking Active Self, Pharmacy Records  cetirizine (ZYRTEC) 10 MG  chewable tablet [id number] Yes Chew 10 mg by mouth daily. [provider] Taking Active Self, Pharmacy Records  dextromethorphan (DELSYM) 30 MG/5ML liquid [id number] Yes Take 5 mLs (30 mg total) by mouth 2 (two) times daily. Rolly Salter, MD Taking Active            Med Note Jonnie Kind Mar 04, 2023  2:47 PM) 03/04/23: Reports during [facility] call her granddaughter is to pick up today from local outpatient pharmacy   diclofenac Sodium (VOLTAREN) 1 % GEL [id number] Yes Apply 2 g topically 4 (four) times daily. Rolly Salter, MD Taking Active   docusate sodium (STOOL SOFTENER) 100 MG capsule [id number] Yes Take 100 mg by mouth daily. [provider] Taking Active Self, Pharmacy Records           Med Note (CRUTHIS, Carlota Raspberry Aug 17, 2022  7:30 AM)    famotidine (PEPCID) 20 MG tablet [id number] Yes One after supper  Patient taking differently: Take 20 mg by mouth every evening. One after supper.  Takes PRN.   Nyoka Cowden, MD Taking Active Self, Pharmacy Records  febuxostat (ULORIC) 40 MG tablet [id number] No Take 1 tablet (40 mg total) by mouth daily.  Patient not taking: Reported on 03/04/2023   Massie Maroon, FNP Unknown Active Self, Pharmacy Records           Med Note Jonnie Kind Mar 04, 2023  2:49 PM) 03/04/23: Reports during [facility] call she has contacted her mail-order pharmacy for a refill; reports is currently out and plans to re-start when she received medication in the mail   ferrous sulfate 325 (65 FE) MG tablet [id number]  Take 1 tablet (325 mg total) by mouth daily with lunch. Dimple Nanas, MD  Expired 02/24/23 2359 Self, Pharmacy Records           Med Note Jonnie Kind Mar 04, 2023  2:48 PM) 03/04/23: Reports during [facility] call is taking as prescribed   folic acid (FOLVITE) 1 MG tablet [id number] Yes Take 1 tablet (1 mg total) by mouth daily. Rolly Salter, MD Taking Active   furosemide (LASIX) 40 MG tablet [id number] Yes Take 1  tablet (40 mg total) by mouth daily. Massie Maroon, FNP Taking Active Self, Pharmacy Records  guaiFENesin (MUCINEX) 600 MG 12 hr tablet [id number] No Take 1 tablet (600 mg total) by mouth 2 (two) times daily. Rolly Salter, MD Unknown Active            Med Note Jonnie Kind Mar 04, 2023  2:47 PM) 03/04/23: Reports during [facility] call her granddaughter is to pick up today from local outpatient pharmacy   hydrOXYzine (ATARAX) 25 MG tablet [id number] Yes Take 1 tablet (25 mg total) by mouth 3 (three) times daily as needed for itching. Rolly Salter, MD Taking Active   Incontinence Supply Disposable (DEPEND UNDERWEAR LARGE/XL) MISC [id number] Yes 1 each by Does not apply route 2 times daily at 12 noon and 4 pm. Massie Maroon, FNP Taking Active Self, Pharmacy Records  montelukast (SINGULAIR) 10 MG  tablet [id number] Yes Take 1 tablet (10 mg total) by mouth at bedtime. Nyoka Cowden, MD Taking Active Self, Pharmacy Records  pantoprazole (PROTONIX) 40 MG tablet [id number] Yes TAKE 1 TABLET EVERY DAY 30 TO 60 MINUTES BEFORE FIRST MEAL OF THE DAY  Patient taking differently: Take 40 mg by mouth See admin instructions. TAKE 1 TABLET EVERY DAY 30 TO 60 MINUTES BEFORE FIRST MEAL OF THE DAY   Nyoka Cowden, MD Taking Active Self, Pharmacy Records  predniSONE (DELTASONE) 10 MG tablet [id number] Yes Take 40mg  daily for 3days,Take 30mg  daily for 3days,Take 20mg  daily for 3days,Take 10mg  daily for 3days, then stop Rolly Salter, MD Taking Active            Home Care and Equipment/Supplies: Were Home Health Services Ordered?: Yes Name of Home Health Agency:: [facility] PT/ OT Has Agency set up a time to come to your home?: No (confirmed patient has contact information for home health agency- confirms she has used agency in the past; she denies need for assistance in calling- provided my direct number should she have needs post-TOC call today) EMR reviewed for Home Health Orders:  Orders present/patient has not received call (refer to CM for follow-up) Any new equipment or medical supplies ordered?: Yes (New BiPaP) Name of Medical supply agency?: [facility] Were you able to get the equipment/medical supplies?: Yes Do you have any questions related to the use of the equipment/supplies?: No  Functional Questionnaire: Do you need assistance with bathing/showering or dressing?: No (son assists as indicated/ needed) Do you need assistance with meal preparation?: No (son assists as indicated/ needed) Do you need assistance with eating?: No Do you have difficulty maintaining continence: No Do you need assistance with getting out of bed/getting out of a chair/moving?: No Do you have difficulty managing or taking your medications?: No  Follow up appointments reviewed: PCP Follow-up appointment confirmed?: No (care coordination outreach in real-time with scheduling care guide to facilitate scheduling hospital follow up PCP appointment: care guide not currently available- confirmed care guide will re-contact patient to schedule) MD Provider Line [phone] Given: No (verified well-established with current PCP) Specialist Hospital Follow-up appointment confirmed?: No Reason Specialist Follow-Up Not Confirmed: Patient has Specialist Provider Number and will Call for Appointment (encouraged patient to contact pulmonary provider office visit to schedule; confirmed she has contact information; she is agreeable to call and schedule and declines needing assistance) Do you need transportation to your follow-up appointment?: No Do you understand care options if your condition(s) worsen?: Yes-patient verbalized understanding  SDOH Interventions Today    Flowsheet Row Most Recent Value  SDOH Interventions   Food Insecurity Interventions Intervention Not Indicated  Transportation Interventions Intervention Not Indicated  [reports continues to use public transportation which "works  well for me, " also states she is planning to call her insurance provider to determine transportation benefit: she denies need for assistance in calling insurance company re: benefit]      TOC Interventions Today    [employer] Most Recent Value  TOC Interventions   TOC Interventions Discussed/Reviewed TOC Interventions Discussed  [provided my direct contact information should questions/ concerns/ needs arise post-TOC call, prior to RN CM telephone visit 03/18/23]      Interventions Today    Flowsheet Row Most Recent Value  Chronic Disease   Chronic disease during today's visit Chronic Obstructive Pulmonary Disease (COPD), Other  [chronic respiratory failure with hypoxia,  on HS BiPaP]  General Interventions   General Interventions  Discussed/Reviewed General Interventions Discussed, [facility], Doctor Visits, Referral to Nurse, Communication with, Horticulturist, commercial (DME)  [scheduled with RN CM Care Coordinator for follow up telephone visit on 03/18/23]  Doctor Visits Discussed/Reviewed Doctor Visits Discussed, PCP, Specialist  Durable Medical Equipment (DME) Other, Dan Humphreys, Oxygen  [confirmed currently requiring/ using assistive devices- walker/ rollator]  PCP/Specialist Visits Compliance with follow-up visit  Communication with RN  Exercise Interventions   Exercise Discussed/Reviewed Exercise Discussed  [facility] for PT/ OT,  pacing activity after hospitalization]  Education Interventions   Education Provided Provided Education  Provided Verbal Education On Web designer benefits]  Nutrition Interventions   Nutrition Discussed/Reviewed Nutrition Discussed  Pharmacy Interventions   Pharmacy Dicussed/Reviewed Pharmacy Topics Discussed  [Full- extensive medication review with updating medication list in EHR per patient report: 5 pages of medications]  Safety Interventions   Safety Discussed/Reviewed Safety Discussed, Fall Risk       Caryl Pina, RN, BSN, CCRN Alumnus RN CM Care Coordination/ Transition of Care- [facility] Care Management [phone]: direct office

## 2023-03-05 DIAGNOSIS — J9621 Acute and chronic respiratory failure with hypoxia: Secondary | ICD-10-CM | POA: Diagnosis not present

## 2023-03-05 DIAGNOSIS — I130 Hypertensive heart and chronic kidney disease with heart failure and stage 1 through stage 4 chronic kidney disease, or unspecified chronic kidney disease: Secondary | ICD-10-CM | POA: Diagnosis not present

## 2023-03-05 DIAGNOSIS — D631 Anemia in chronic kidney disease: Secondary | ICD-10-CM | POA: Diagnosis not present

## 2023-03-05 DIAGNOSIS — I5033 Acute on chronic diastolic (congestive) heart failure: Secondary | ICD-10-CM | POA: Diagnosis not present

## 2023-03-05 DIAGNOSIS — J4489 Other specified chronic obstructive pulmonary disease: Secondary | ICD-10-CM | POA: Diagnosis not present

## 2023-03-05 DIAGNOSIS — N1832 Chronic kidney disease, stage 3b: Secondary | ICD-10-CM | POA: Diagnosis not present

## 2023-03-05 DIAGNOSIS — J441 Chronic obstructive pulmonary disease with (acute) exacerbation: Secondary | ICD-10-CM | POA: Diagnosis not present

## 2023-03-05 DIAGNOSIS — J4541 Moderate persistent asthma with (acute) exacerbation: Secondary | ICD-10-CM | POA: Diagnosis not present

## 2023-03-05 DIAGNOSIS — J9622 Acute and chronic respiratory failure with hypercapnia: Secondary | ICD-10-CM | POA: Diagnosis not present

## 2023-03-08 ENCOUNTER — Telehealth: Payer: Self-pay

## 2023-03-08 NOTE — Discharge Summary (Signed)
Physician Discharge Summary   Patient: Pamela [name] MRN: [id number] DOB: 12/17/1950  Admit date:     02/23/2023  Discharge date: 03/03/2023  Discharge Physician: Lynden Oxford  PCP: Massie Maroon, FNP  Recommendations at discharge:  Follow up with PCP in 1 week   Follow-up Information     [facility] Follow up.   Specialty: Home Health Services Why: Home Health will follow up 24hr to 48hrs after discharge. Contact information: [address] [place] [address] [phone]         [name], [facility] Follow up.   Why: DME company that supllies your home O2. Contact information: [address] [address] [phone]         Massie Maroon, FNP. Schedule an appointment as soon as possible for a visit in 1 week(s).   Specialty: Family Medicine Contact information: [address] [place] [address] [phone]         Nyoka Cowden, MD. Schedule an appointment as soon as possible for a visit in 2 week(s).   Specialty: Pulmonary Disease Contact information: [address] [place] [address] [phone]                Discharge Diagnoses: Principal Problem:   Acute on chronic respiratory acidosis (HCC) Active Problems:   Moderate persistent asthma with acute exacerbation   Tracheomalacia   Acute on chronic respiratory failure with hypoxia (HCC)   Chronic kidney disease   Severe asthma with exacerbation  Brief hospital course: Pamela [name] is a 72 y.o. female with PMH of  COPD on 2L ox via [place], heart failure with preserved EF, HTN, morbid obesity, sarcoidosis, lung cancer adenocarcinoma in 2012 in remission as reviewed from EMR, presented at [facility] long ED due to 4 days of hypoxic respiratory failure, and dyspnea due to exacerbation of COPD.   Currently being treated for COPD exacerbation with antibiotics and steroids.  Also being diuresed.  Assessment and Plan: Acute on  chronic hypoxic and hypercapnic respiratory failure due to COPD exacerbation Chest x-ray with no acute abnormality, no fevers or cytosis, so low suspicion for acute bacterial infection.  COVID-negative, RVP negative Continue nebulizer therapy. Continue doxycycline. Continue Solu-Medrol.  Will attempt to see if a higher dose of Solu-Medrol will help with the wheeze. Continue suppressive medicines for cough. Continue incentive spirometry and flutter valve Continue BiPAP as needed and nightly. Pulmonary consult appreciated. Recommending NIV on discharge.  TOC consulted, and again on 8/12. Patient agreeable to use NIV on discharge.  Acute on chronic diastolic CHF. Initially was on oral Lasix, now to torsemide. Echocardiogram in January 2024 shows EF of 60-65%. Continue holding diuresis.  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   Appears to be trending up.  Holding diuresis.   GERD Protonix p.o.  Bilateral knee pain. Voltaren gel   HLD. Continue statin.   Prediabetes. Hemoglobin A1c appears to be well-controlled. Monitor.   Class III obesity. Body mass index is 54.04 kg/m.  At risk for obesity hypoventilation syndrome. Recommend NIV on discharge. Placing the patient at high risk for poor outcome.  Mild hyperkalemia. Monitoring for now.  Microcytic anemia. Hemoglobin around 11.  Iron level stable.  Thrombocytopenia. Appears to be acute.  Mild. Appears to be stabilizing now. Lower extremity Dopplers were negative for any DVT. Making consumption etiology less likely.  Pain control - [facility] Controlled  Substance Reporting System database was reviewed. and patient was instructed, not to drive, operate heavy machinery, perform activities at heights, swimming or participation in water activities or provide baby-sitting services while on Pain, Sleep and Anxiety Medications; until their outpatient Physician  has advised to do so again. Also recommended to not to take more than prescribed Pain, Sleep and Anxiety Medications.  Consultants:  PCCM   Procedures performed:  none  DISCHARGE MEDICATION: Allergies as of 03/03/2023       Reactions   Sulfa Antibiotics Hives, Rash        Medication List     TAKE these medications    acetaminophen 500 MG tablet Commonly known as: TYLENOL Take 1,000 mg by mouth every 6 (six) hours as needed for mild pain.   albuterol (2.5 MG/3ML) 0.083% nebulizer solution Commonly known as: PROVENTIL USE 1 VIAL IN NEBULIZER EVERY 6 HOURS AS NEEDED FOR WHEEZING FOR SHORTNESS OF BREATH   albuterol 108 (90 Base) MCG/ACT inhaler Commonly known as: ProAir HFA Inhale 2 puffs into the lungs every 4 (four) hours as needed for wheezing or shortness of breath.   aspirin EC 81 MG tablet Take 1 tablet (81 mg total) by mouth daily. Swallow whole.   atorvastatin 40 MG tablet Commonly known as: LIPITOR Take 1 tablet (40 mg total) by mouth daily.   B-complex with vitamin C tablet Take 1 tablet by mouth daily.   benzonatate 100 MG capsule Commonly known as: TESSALON Take 1 capsule (100 mg total) by mouth 3 (three) times daily.   budesonide 0.25 MG/2ML nebulizer solution Commonly known as: Pulmicort One twice daily with albuterol   calcitRIOL 0.25 MCG capsule Commonly known as: ROCALTROL Take 1 capsule (0.25 mcg total) by mouth daily.   cetirizine 10 MG chewable tablet Commonly known as: ZYRTEC Chew 10 mg by mouth daily.   Depend Underwear Large/XL Misc 1 each by Does not apply route 2 times daily at 12 noon and 4 pm.   dextromethorphan 30 MG/5ML liquid Commonly known as: DELSYM Take 5 mLs (30 mg total) by mouth 2 (two) times daily.   diclofenac Sodium 1 % Gel Commonly known as: VOLTAREN Apply 2 g topically 4 (four) times daily.   famotidine 20 MG tablet Commonly known as: Pepcid One after supper What changed:  how much to take how to take  this when to take this additional instructions   febuxostat 40 MG tablet Commonly known as: ULORIC Take 1 tablet (40 mg total) by mouth daily.   ferrous sulfate 325 (65 FE) MG tablet Take 1 tablet (325 mg total) by mouth daily with lunch.   folic acid 1 MG tablet Commonly known as: FOLVITE Take 1 tablet (1 mg total) by mouth daily.   furosemide 40 MG tablet Commonly known as: LASIX Take 1 tablet (40 mg total) by mouth daily.   guaiFENesin 600 MG 12 hr tablet Commonly known as: MUCINEX Take 1 tablet (600 mg total) by mouth 2 (two) times daily.   hydrOXYzine 25 MG tablet Commonly known as: ATARAX Take 1 tablet (25 mg total) by mouth 3 (three) times daily as needed for itching.   montelukast 10 MG tablet Commonly known as: SINGULAIR Take 1 tablet (10 mg total) by mouth at bedtime.   pantoprazole 40 MG tablet Commonly known as: PROTONIX TAKE 1 TABLET EVERY DAY 30 TO 60 MINUTES BEFORE FIRST MEAL OF THE DAY What changed: See the new instructions.   predniSONE 10 MG tablet Commonly known as: DELTASONE Take  40mg  daily for 3days,Take 30mg  daily for 3days,Take 20mg  daily for 3days,Take 10mg  daily for 3days, then stop   Stool Softener 100 MG capsule Generic drug: docusate sodium Take 100 mg by mouth daily.       Disposition: Home Diet recommendation: Cardiac diet  Discharge Exam: Vitals:   03/02/23 2237 03/03/23 0413 03/03/23 0835 03/03/23 1449  BP:  121/65  118/71  Pulse: 83 64  75  Resp: (!) 22 20  20   Temp:  98.3 F (36.8 C)  98.3 F (36.8 C)  TempSrc:  Oral  Oral  SpO2: 98% 97% 99% 97%  Weight:      Height:       General: Appear in no distress; no visible Abnormal Neck Mass Or lumps, Conjunctiva normal Cardiovascular: S1 and S2 Present, no Murmur, Respiratory: good respiratory effort, Bilateral Air entry present and CTA, no Crackles, no wheezes Abdomen: Bowel Sound present,  Extremities: bilateral Pedal edema Neurology: alert and oriented to time, place,  and person  Filed Weights   02/23/23 1237  Weight: (!) 142.8 kg   Condition at discharge: stable  The results of significant diagnostics from this hospitalization (including imaging, microbiology, ancillary and laboratory) are listed below for reference.   Imaging Studies: DG CHEST PORT 1 VIEW  Result Date: 02/26/2023 CLINICAL DATA:  Shortness of breath EXAM: PORTABLE CHEST 1 VIEW COMPARISON:  Chest radiograph 02/23/2023 FINDINGS: A left chest wall loop recorder is noted. The heart appears enlarged, likely exaggerated by AP technique. The upper mediastinal contours are normal There is bandlike opacity in the right midlung favored to reflect atelectasis. There is no other focal airspace opacity. There is no pulmonary edema. There is no pleural effusion or pneumothorax. There is no acute osseous abnormality. IMPRESSION: 1. Bandlike opacity in the right midlung likely reflects atelectasis. Otherwise, no focal consolidation or pleural effusion. 2. The heart appears enlarged, likely exaggerated by AP technique. Electronically Signed   By: Lesia Hausen M.D.   On: 02/26/2023 12:16   VAS [place] LOWER EXTREMITY VENOUS (DVT)  Result Date: 02/25/2023  Lower Venous DVT Study Patient Name:  Pamela [name]  Date of Exam:   02/25/2023 Medical Rec #: [id number]       Accession #:    [id number] Date of Birth: [birth year]       Patient Gender: F Patient Age:   89 years Exam Location:  [facility] Procedure:      VAS [place] LOWER EXTREMITY VENOUS (DVT) Referring Phys: Gillis Santa --------------------------------------------------------------------------------  Indications: Edema.  Risk Factors: None identified. Limitations: Body habitus and poor ultrasound/tissue interface. Comparison Study: No prior studies. Performing Technologist: Chanda Busing RVT  Examination Guidelines: A complete evaluation includes B-mode imaging, spectral Doppler, color Doppler, and power Doppler as needed of all accessible portions of each  vessel. Bilateral testing is considered an integral part of a complete examination. Limited examinations for reoccurring indications may be performed as noted. The reflux portion of the exam is performed with the patient in reverse Trendelenburg.  +---------+---------------+---------+-----------+----------+--------------+ RIGHT    CompressibilityPhasicitySpontaneityPropertiesThrombus Aging +---------+---------------+---------+-----------+----------+--------------+ CFV      Full           Yes      Yes                                 +---------+---------------+---------+-----------+----------+--------------+ SFJ      Full                                                        +---------+---------------+---------+-----------+----------+--------------+  FV Prox  Full                                                        +---------+---------------+---------+-----------+----------+--------------+ FV Mid   Full                                                        +---------+---------------+---------+-----------+----------+--------------+ FV DistalFull                                                        +---------+---------------+---------+-----------+----------+--------------+ PFV      Full                                                        +---------+---------------+---------+-----------+----------+--------------+ POP      Full           Yes      Yes                                 +---------+---------------+---------+-----------+----------+--------------+ PTV      Full                                                        +---------+---------------+---------+-----------+----------+--------------+ PERO     Full                                                        +---------+---------------+---------+-----------+----------+--------------+   +---------+---------------+---------+-----------+----------+--------------+ LEFT      CompressibilityPhasicitySpontaneityPropertiesThrombus Aging +---------+---------------+---------+-----------+----------+--------------+ CFV      Full           Yes      Yes                                 +---------+---------------+---------+-----------+----------+--------------+ SFJ      Full                                                        +---------+---------------+---------+-----------+----------+--------------+ FV Prox  Full                                                        +---------+---------------+---------+-----------+----------+--------------+  FV Mid   Full                                                        +---------+---------------+---------+-----------+----------+--------------+ FV DistalFull                                                        +---------+---------------+---------+-----------+----------+--------------+ PFV      Full                                                        +---------+---------------+---------+-----------+----------+--------------+ POP      Full           Yes      Yes                                 +---------+---------------+---------+-----------+----------+--------------+ PTV      Full                                                        +---------+---------------+---------+-----------+----------+--------------+ PERO     Full                                                        +---------+---------------+---------+-----------+----------+--------------+     Summary: RIGHT: - There is no evidence of deep vein thrombosis in the lower extremity. However, portions of this examination were limited- see technologist comments above.  - No cystic structure found in the popliteal fossa.  LEFT: - There is no evidence of deep vein thrombosis in the lower extremity. However, portions of this examination were limited- see technologist comments above.  - No cystic structure found in the popliteal  fossa.  *See table(s) above for measurements and observations. Electronically signed by Heath Lark on 02/25/2023 at 4:05:30 PM.    Final    DG Chest Portable 1 View  Result Date: 02/23/2023 CLINICAL DATA:  Respiratory failure EXAM: PORTABLE CHEST 1 VIEW COMPARISON:  Chest radiograph 08/16/2018 FINDINGS: A cardiac loop recorder is again right. The cardiomediastinal silhouette is normal. There is no focal consolidation or pulmonary edema. There is no pleural effusion or pneumothorax There is no acute osseous abnormality. IMPRESSION: Stable chest with no radiographic evidence of acute cardiopulmonary process. Electronically Signed   By: Lesia Hausen M.D.   On: 02/23/2023 13:42    Microbiology: Results for orders placed or performed during the hospital encounter of 02/23/23  SARS Coronavirus 2 by RT PCR (hospital order, performed in [facility] hospital lab) *cepheid single result test* Anterior Nasal Swab     Status: None   Collection Time: 02/23/23 12:56 PM  Specimen: Anterior Nasal Swab  Result Value Ref Range Status   SARS Coronavirus 2 by RT PCR NEGATIVE NEGATIVE Final    Comment: (NOTE) SARS-CoV-2 target nucleic acids are NOT DETECTED.  The SARS-CoV-2 RNA is generally detectable in upper and lower respiratory specimens during the acute phase of infection. The lowest concentration of SARS-CoV-2 viral copies this assay can detect is 250 copies / mL. A negative result does not preclude SARS-CoV-2 infection and should not be used as the sole basis for treatment or other patient management decisions.  A negative result may occur with improper specimen collection / handling, submission of specimen other than nasopharyngeal swab, presence of viral mutation(s) within the areas targeted by this assay, and inadequate number of viral copies (<250 copies / mL). A negative result must be combined with clinical observations, patient history, and epidemiological information.  Fact Sheet for Patients:    RoadLapTop.co.za  Fact Sheet for Healthcare Providers: http://kim-miller.com/  This test is not yet approved or  cleared by the [place] FDA and has been authorized for detection and/or diagnosis of SARS-CoV-2 by FDA under an Emergency Use Authorization (EUA).  This EUA will remain in effect (meaning this test can be used) for the duration of the COVID-19 declaration under Section 564(b)(1) of the Act, 21 U.S.C. section 360bbb-3(b)(1), unless the authorization is terminated or revoked sooner.  Performed at [facility], [address], [place], [place] [address]   MRSA Next Gen by PCR, Nasal     Status: None   Collection Time: 02/23/23  4:39 PM   Specimen: Nasal Mucosa; Nasal Swab  Result Value Ref Range Status   MRSA by PCR Next Gen NOT DETECTED NOT DETECTED Final    Comment: (NOTE) The GeneXpert MRSA Assay (FDA approved for NASAL specimens only), is one component of a comprehensive MRSA colonization surveillance program. It is not intended to diagnose MRSA infection nor to guide or monitor treatment for MRSA infections. Test performance is not FDA approved in patients less than 79 years old. Performed at [facility], [address] [place], [place] [address]   Respiratory (~20 pathogens) panel by PCR     Status: None   Collection Time: 02/24/23  7:56 AM   Specimen: Nasopharyngeal Swab; Respiratory  Result Value Ref Range Status   Adenovirus NOT DETECTED NOT DETECTED Final   Coronavirus 229E NOT DETECTED NOT DETECTED Final    Comment: (NOTE) The Coronavirus on the Respiratory Panel, DOES NOT test for the novel  Coronavirus (2019 nCoV)    Coronavirus HKU1 NOT DETECTED NOT DETECTED Final   Coronavirus NL63 NOT DETECTED NOT DETECTED Final   Coronavirus OC43 NOT DETECTED NOT DETECTED Final   Metapneumovirus NOT DETECTED NOT DETECTED Final   Rhinovirus / Enterovirus NOT DETECTED NOT  DETECTED Final   Influenza A NOT DETECTED NOT DETECTED Final   Influenza B NOT DETECTED NOT DETECTED Final   Parainfluenza Virus 1 NOT DETECTED NOT DETECTED Final   Parainfluenza Virus 2 NOT DETECTED NOT DETECTED Final   Parainfluenza Virus 3 NOT DETECTED NOT DETECTED Final   Parainfluenza Virus 4 NOT DETECTED NOT DETECTED Final   Respiratory Syncytial Virus NOT DETECTED NOT DETECTED Final   Bordetella pertussis NOT DETECTED NOT DETECTED Final   Bordetella Parapertussis NOT DETECTED NOT DETECTED Final   Chlamydophila pneumoniae NOT DETECTED NOT DETECTED Final   Mycoplasma pneumoniae NOT DETECTED NOT DETECTED Final    Comment: Performed at [facility], [address] [place],  [place] [address]   Labs: CBC: No results for input(s): "WBC", "NEUTROABS", "HGB", "HCT", "MCV", "PLT" in the last 168 hours. Basic Metabolic Panel: Recent Labs  Lab 03/02/23 0824 03/03/23 0917  NA 136 134*  K 3.7 3.4*  CL 88* 85*  CO2 36* 37*  GLUCOSE 116* 85  BUN 71* 69*  CREATININE 1.69* 1.27*  CALCIUM 8.2* 8.0*  MG  --  1.6*   Liver Function Tests: No results for input(s): "AST", "ALT", "ALKPHOS", "BILITOT", "PROT", "ALBUMIN" in the last 168 hours. CBG: No results for input(s): "GLUCAP" in the last 168 hours.  Discharge time spent: greater than 30 minutes.  Author: Lynden Oxford, MD  [facility] 03/03/2023

## 2023-03-08 NOTE — Telephone Encounter (Signed)
Sabra Heck, PT from [facility] called asking for verbal to see patient 2 times a week for 2 weeks and 1 time a week for 5 weeks. Please advise if its ok. [phone].

## 2023-03-09 DIAGNOSIS — J441 Chronic obstructive pulmonary disease with (acute) exacerbation: Secondary | ICD-10-CM | POA: Diagnosis not present

## 2023-03-09 DIAGNOSIS — J9601 Acute respiratory failure with hypoxia: Secondary | ICD-10-CM | POA: Diagnosis not present

## 2023-03-09 DIAGNOSIS — I5032 Chronic diastolic (congestive) heart failure: Secondary | ICD-10-CM | POA: Diagnosis not present

## 2023-03-12 ENCOUNTER — Ambulatory Visit (INDEPENDENT_AMBULATORY_CARE_PROVIDER_SITE_OTHER): Payer: Medicare PPO | Admitting: Podiatry

## 2023-03-12 DIAGNOSIS — Q828 Other specified congenital malformations of skin: Secondary | ICD-10-CM | POA: Diagnosis not present

## 2023-03-12 DIAGNOSIS — B351 Tinea unguium: Secondary | ICD-10-CM

## 2023-03-12 DIAGNOSIS — M79675 Pain in left toe(s): Secondary | ICD-10-CM

## 2023-03-12 DIAGNOSIS — M79674 Pain in right toe(s): Secondary | ICD-10-CM

## 2023-03-12 NOTE — Progress Notes (Signed)
Subjective: Chief Complaint  Patient presents with   Nail Problem    Routine Foot Care-nail trim     72 y.o. returns the office today for painful, elongated, thickened toenails which she cannot trim herself.  She has pain in the nails but no other areas of discomfort.  No open lesions that she reports.  Pamela Maroon, FNP   Objective: AAO 3, NAD DP/PT pulses palpable, CRT less than 3 seconds Chronic lower extremity edema. Nails hypertrophic, dystrophic, elongated, brittle, discolored 10. There is tenderness overlying the nails 1-5 bilaterally. There is no surrounding erythema or drainage along the nail sites. There are no other areas of discomfort noted today. Callus formation along the lateral aspect of the right heel without any underlying ulceration, drainage or any signs of infection. No pain with calf compression, swelling, warmth, erythema.  Assessment: Patient presents with symptomatic onychomycosis, hyperkeratotic lesion  Plan: -Treatment options including alternatives, risks, complications were discussed -Nails sharply debrided 10 without complication/bleeding. -Debrided hyperkeratotic lesion x 1 without any complications or bleeding. -Daily foot inspection discussed. -Follow-up in 3 months or sooner if any problems are to arise. In the meantime, encouraged to call the office with any questions, concerns, changes symptoms.  Return in about 3 months (around 06/12/2023).  Pamela [name] DPM

## 2023-03-13 DIAGNOSIS — J45909 Unspecified asthma, uncomplicated: Secondary | ICD-10-CM | POA: Diagnosis not present

## 2023-03-13 DIAGNOSIS — I5032 Chronic diastolic (congestive) heart failure: Secondary | ICD-10-CM | POA: Diagnosis not present

## 2023-03-13 DIAGNOSIS — J9601 Acute respiratory failure with hypoxia: Secondary | ICD-10-CM | POA: Diagnosis not present

## 2023-03-15 DIAGNOSIS — N1832 Chronic kidney disease, stage 3b: Secondary | ICD-10-CM | POA: Diagnosis not present

## 2023-03-15 DIAGNOSIS — I130 Hypertensive heart and chronic kidney disease with heart failure and stage 1 through stage 4 chronic kidney disease, or unspecified chronic kidney disease: Secondary | ICD-10-CM | POA: Diagnosis not present

## 2023-03-15 DIAGNOSIS — J9622 Acute and chronic respiratory failure with hypercapnia: Secondary | ICD-10-CM | POA: Diagnosis not present

## 2023-03-15 DIAGNOSIS — J4541 Moderate persistent asthma with (acute) exacerbation: Secondary | ICD-10-CM | POA: Diagnosis not present

## 2023-03-15 DIAGNOSIS — J4489 Other specified chronic obstructive pulmonary disease: Secondary | ICD-10-CM | POA: Diagnosis not present

## 2023-03-15 DIAGNOSIS — J441 Chronic obstructive pulmonary disease with (acute) exacerbation: Secondary | ICD-10-CM | POA: Diagnosis not present

## 2023-03-15 DIAGNOSIS — I5033 Acute on chronic diastolic (congestive) heart failure: Secondary | ICD-10-CM | POA: Diagnosis not present

## 2023-03-15 DIAGNOSIS — J9621 Acute and chronic respiratory failure with hypoxia: Secondary | ICD-10-CM | POA: Diagnosis not present

## 2023-03-15 DIAGNOSIS — D631 Anemia in chronic kidney disease: Secondary | ICD-10-CM | POA: Diagnosis not present

## 2023-03-17 ENCOUNTER — Institutional Professional Consult (permissible substitution) (INDEPENDENT_AMBULATORY_CARE_PROVIDER_SITE_OTHER): Payer: Medicare PPO | Admitting: Otolaryngology

## 2023-03-18 ENCOUNTER — Ambulatory Visit: Payer: Self-pay

## 2023-03-18 NOTE — Patient Outreach (Signed)
  Care Coordination   Initial Visit Note   03/18/2023 Name: Pamela [name] MRN: [id number] DOB: 12-Jun-1951  Pamela [name] is a 72 y.o. year old female who sees Massie Maroon, FNP for primary care. I spoke with  [name] by phone today.  What matters to the patients health and wellness today?  Admitted 02/23/23-03/03/23. [name] reports she is ok, returned to baseline. She reports she has all her medications and is taking without difficulty; wearing O2 at3L continuous, uses Resmed Aircurve 10 BiPAP with oxygen at night as prescribed. She states recently took a trip to visit family friends in [place] and combination of things stimulating from the trip contributed to her flare.  Upcoming appointments with primary provider 03/30/23 and pulmonologist 03/25/23 scheduled. She denies any questions or concerns at this time.  Goals Addressed             This Visit's Progress    continue to improve post hospitalization       Interventions Today    Flowsheet Row Most Recent Value  Chronic Disease   Chronic disease during today's visit Chronic Obstructive Pulmonary Disease (COPD), Congestive Heart Failure (CHF)  General Interventions   General Interventions Discussed/Reviewed General Interventions Discussed  Doctor Visits Discussed/Reviewed Doctor Visits Discussed  Durable Medical Equipment (DME) Oxygen, Dan Humphreys, Other  [Resmed aircurve 10 Bipap]  Exercise Interventions   Exercise Discussed/Reviewed Exercise Discussed  [reports active with home health therapy]  Education Interventions   Education Provided Provided Education  Provided Verbal Education On Medication, Other  [advised to continue to take medications as prescribed, attend provider visits as scheduled/recommended, oxygen as prescribed. contact provider with health questions or concerns.]  Nutrition Interventions   Nutrition Discussed/Reviewed Nutrition Discussed  Pharmacy Interventions   Pharmacy Dicussed/Reviewed Pharmacy  Topics Discussed  Safety Interventions   Safety Discussed/Reviewed Safety Discussed            SDOH assessments and interventions completed:  No  Care Coordination Interventions:  Yes, provided   Follow up plan: Follow up call scheduled for 04/06/23    Encounter Outcome:  Pt. Visit Completed   Kathyrn Sheriff, RN, MSN, BSN, CCM Care Management Coordinator [phone]

## 2023-03-18 NOTE — Patient Instructions (Signed)
Visit Information  Thank you for taking time to visit with me today. Please don't hesitate to contact me if I can be of assistance to you.   Following are the goals we discussed today:  Take medications as prescribed. Attend provider visits as scheduled. You have an upcoming appointment with your pulmonologist on 03/25/23 and primary care provider on 03/30/23. Eat healthy, lean meats, vegetables, fruits, avoid saturated and transfats Continue to Use Oxygen and PiPap machine as prescribed by provider Contact your provider for any health questions or concerns.   Our next appointment is by telephone on 04/06/23 at 1:15 pm  Please call the care guide team at [phone] if you need to cancel or reschedule your appointment.   If you are experiencing a Mental Health or Behavioral Health Crisis or need someone to talk to, please call the Suicide and Crisis Lifeline: 988 call the [place] National Suicide Prevention Lifeline: 254-829-5367 or TTY: 586-243-4538 TTY (825) 393-0919) to talk to a trained counselor call 1-800-273-TALK (toll free, 24 hour hotline)  Kathyrn Sheriff, RN, MSN, BSN, CCM Care Management Coordinator [phone]

## 2023-03-24 ENCOUNTER — Ambulatory Visit: Payer: Medicare PPO | Attending: Internal Medicine

## 2023-03-24 DIAGNOSIS — I5032 Chronic diastolic (congestive) heart failure: Secondary | ICD-10-CM

## 2023-03-24 DIAGNOSIS — Z8673 Personal history of transient ischemic attack (TIA), and cerebral infarction without residual deficits: Secondary | ICD-10-CM | POA: Diagnosis not present

## 2023-03-24 LAB — COMPREHENSIVE METABOLIC PANEL
ALT: 37 IU/L — ABNORMAL HIGH (ref 0–32)
AST: 39 IU/L (ref 0–40)
Albumin: 3.4 g/dL — ABNORMAL LOW (ref 3.9–4.9)
Alkaline Phosphatase: 189 IU/L — ABNORMAL HIGH (ref 44–121)
BUN/Creatinine Ratio: 17 (ref 12–28)
BUN: 23 mg/dL (ref 8–27)
Bilirubin Total: 0.4 mg/dL (ref 0.0–1.2)
CO2: 27 mmol/L (ref 20–29)
Calcium: 8.4 mg/dL — ABNORMAL LOW (ref 8.7–10.3)
Chloride: 102 mmol/L (ref 96–106)
Creatinine, Ser: 1.38 mg/dL — ABNORMAL HIGH (ref 0.57–1.00)
Globulin, Total: 2.2 g/dL (ref 1.5–4.5)
Glucose: 84 mg/dL (ref 70–99)
Potassium: 4.1 mmol/L (ref 3.5–5.2)
Sodium: 140 mmol/L (ref 134–144)
Total Protein: 5.6 g/dL — ABNORMAL LOW (ref 6.0–8.5)
eGFR: 41 mL/min/{1.73_m2} — ABNORMAL LOW (ref >59)

## 2023-03-24 LAB — LIPID PANEL
Chol/HDL Ratio: 2.9 ratio (ref 0.0–4.4)
Cholesterol, Total: 171 mg/dL (ref 100–199)
HDL: 60 mg/dL (ref >39)
LDL Chol Calc (NIH): 97 mg/dL (ref 0–99)
Triglycerides: 72 mg/dL (ref 0–149)
VLDL Cholesterol Cal: 14 mg/dL (ref 5–40)

## 2023-03-25 ENCOUNTER — Ambulatory Visit: Payer: Medicare PPO | Admitting: Internal Medicine

## 2023-03-25 NOTE — Progress Notes (Deleted)
Subjective:    Patient ID: Pamela [name], female    DOB: [birth year]  MRN: [id number]  Brief patient profile:  72 yobf never smoker but lots of second hand exp with 1st asthma attack in 1990s and on maint rx since late 90's  and freq courses of prednisone=  3-4 x per year despite advair and spiriva and freq saba referred to pulmonary clinic 04/27/2014 by Dr Ashley Royalty s/p CT Bx 06/25/11 > no path report epic  but per oncology = [id number] adenoca of lung >  RT only per pt RUL completed 09/2011.     History of Present Illness  04/27/2014 1st [facility] office visit/ Gael Delude   Chief Complaint  Patient presents with   Pulmonary Consult    Referred by Dr. Hart Rochester. [name]  Pt recently moved to [place] from [place] and states needs to establish with new pulmonary MD.  She c/o DOE and cough, and states that she feels these symptoms are currently under control.    on prednisone 10 mg daily  since late July 2015 and still on freq saba and 2lpm  Already used   2puffs proair am of ov  rec Stop spiriva and advair Start dulera 100 Take 2 puffs first thing in am and then another 2 puffs about 12 hours later.  Work on inhaler technique:  relax and gently blow all the way out then take a nice smooth deep breath back in, triggering the inhaler at same time you start breathing in.  Hold for up to 5 seconds if you can.  Rinse and gargle with water when done Only use your albuterol (proair) as a rescue medication  If proair not helping, then use the neb and if needing the neb more than occastional, then take prednisone 10 mg daily  Please schedule a follow up office visit in 4 weeks, sooner if needed with all inhalers in hand for pfts on return    Admit date: 08/16/2022 Discharge date: 08/25/2022      Recommendations for Outpatient Follow-up:  Follow up with PCP in 1-2 weeks Please obtain BMP/CBC in one week your next doctors visit.  Prednisone and  iron tablets prescribed.  Advised to follow-up outpatient pulmonary next 5 to 7 days. She tells me she is enough bronchodilators at home. 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  Over the course of several days patient started doing better, she completed course of doxycycline in the hospital.  IV Solu-Medrol was transitioned to p.o. prednisone. PT/OT recommended home health therefore arrangements were made.       Assessment & Plan:  Principal Problem:   COPD with acute exacerbation (HCC)   Chronic diastolic CHF (congestive heart failure) (HCC)   Stage 3b chronic kidney disease (HCC)   Prediabetes   Obstructive sleep apnea   Acute on chronic respiratory failure with hypoxia and hypercapnia (HCC)   Vitamin D deficiency   Acute respiratory failure with hypercapnia (HCC)     Acute respiratory failure with hypoxia and hypercarbia likely due to COPD exacerbation:  Overall patient was very slow to improve and today her breath sounds are much clear.  She has completed course of doxycycline.  Transition to p.o. prednisone for 3 more days.  Continue using home bronchodilators, she tells me she has enough supplies at home.  I have advised she follow-up outpatient with her pulmonary next 5 to 7 days.     Chronic diastolic CHF (congestive heart failure) (HCC) BN P unremarkable, 2D echo showed an EF of 60% no wall motion abnormality. Continue Lasix 40 mg orally daily   Chronic kidney disease stage IIIb:  Creatinine around baseline of 1.4.  On daily iron at home         08/27/2022  72 post hosp f/u ov/Carmell Elgin re: asthma/MO   maint on Trelelgy  200 finished prednisone/ refuses sleep aid other 02   Chief Complaint  Patient presents with   Hospitalization Follow-up    COPD exacerbation.  Patient states she is doing much better.  Improved SOB, Cough and wheeze  Dyspnea:  [employer] walking  baseline with care/ now use rollator due to balance  Cough: none though freq throat clearing  Sleeping: flat bed 4 pillows  SABA use: not using cpap and refuses to consider it again  02: 3lpm 24/7 was 2  Covid status:   vax x for last one,  had the infection 2020  Rec Pantoprazole (protonix) 40 mg   Take  30-60 min before first meal of the day and Pepcid (famotidine)  20 mg after supper until return to office   GERD diet reviewed, bed blocks rec  Please remember to go to the lab department   for your tests - we will call you with the results when they are available.  Please schedule a follow up office visit in 6 weeks, call sooner if needed with all medications /inhalers/ solutions in hand    72/21/2024  f/u ov/Virgilio Broadhead re: asthma/M0   maint on nothing regularly (no trelegy/ montelukast )  and confused with maint vs  prns  Chief Complaint  Patient presents with   Follow-up    Doing well.  Some cough today.  Dyspnea:  [employer] walking and does  half a block at home with sats ok  on 3lpm  Cough: no producttion  Sleeping: flat bed 4 pillows  SABA use: rarely  02: 3lpm 24/7  Rec Leave montelukast off to see what kind of year you have with your sinuses  If cough gets worse > Pantoprazole (protonix) 40 mg   Take  30-60 min before first meal of the day and Pepcid (famotidine)  20 mg after supper until cough is gone for at  least a week  Make sure you check your oxygen saturation  AT  your highest level of activity (not after you stop)   to be sure it stays over 90%  For short ness of breath, worse wheezing > albuterol nebulizer 2.5 mg twice daily with budesonide 0.25     72/06/2023   f/u ov/Sahil Milner re: asthma/MO   maint on montelukast  /  qid alb/bud and gerd rx  though easily confused with details of care  Chief Complaint  Patient presents with   Follow-up    States using O2, having cough  with some clear sputum at time, DOE at times  Dyspnea:  says better  Cough: worse x one month but mucus clear  Sleeping: flat bed with 4 pillows no cough or sob  SABA GUY:QIHK  not helping at qid alb / bud  02: 3lpm  Rec My office will be contacting you by phone for referral to ENT specialist   For cough > mucinex dm 1200 mg every 12 hours and use flutter valve as much as you can  No change in your medications - if getting worse please return to Dr Celine Mans   Please schedule a follow up office visit in 6-8  weeks, call sooner if needed with all medications /inhalers/ solutions in hand       Discharge Diagnoses: Principal Problem:   Acute on chronic respiratory acidosis (HCC)   Moderate persistent asthma with acute exacerbation   Tracheomalacia   Acute on chronic respiratory failure with hypoxia (HCC)   Chronic kidney disease   Severe asthma with exacerbation   Brief hospital course: Lajuanda Yaffe is a 72 y.o. female with PMH of  COPD on 2L ox via [place], heart failure with preserved EF, HTN, morbid obesity, sarcoidosis, lung cancer adenocarcinoma in 2012 in remission as reviewed from EMR, presented at [facility] long ED due to 4 days of hypoxic respiratory failure, and dyspnea due to exacerbation of COPD.   Currently being treated for COPD exacerbation with antibiotics and steroids.  Also being diuresed.   Assessment and Plan: Acute on chronic hypoxic and hypercapnic respiratory failure due to COPD exacerbation Chest x-ray with no acute abnormality, no fevers or cytosis, so low suspicion for acute bacterial infection.  COVID-negative, RVP negative Continue nebulizer therapy. Continue doxycycline. Continue Solu-Medrol.  Will attempt to see if a higher dose of Solu-Medrol will help with  the wheeze. Continue suppressive medicines for cough. Continue incentive spirometry and flutter valve Continue BiPAP as needed and nightly. Pulmonary consult appreciated. Recommending NIV on discharge.  TOC consulted, and again on 8/12. Patient agreeable to use NIV on discharge.   Acute on chronic diastolic CHF. Initially was on oral Lasix, now to torsemide. Echocardiogram in January 2024 shows EF of 60-65%. Continue holding diuresis.   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   Appears to be trending up.  Holding diuresis.   GERD Protonix p.o.   Bilateral knee pain. Voltaren gel   HLD. Continue statin.   Prediabetes. Hemoglobin A1c appears to be well-controlled. Monitor.   Class III obesity. Body mass index is 54.04 kg/m.  At risk for obesity hypoventilation syndrome. Recommend NIV on discharge. Placing the patient at high risk for poor outcome.   Mild hyperkalemia. Monitoring for now.   Microcytic anemia. Hemoglobin around 11.  Iron level stable.   Thrombocytopenia. Appears to be acute.  Mild. Appears to be stabilizing now. Lower extremity Dopplers were negative for any DVT. Making consumption etiology less likely.     Consultants:  PCCM   03/25/2023  f/u ov/Myriah Boggus re: ***   maint on *** did *** bring meds  No chief complaint on file.   Dyspnea:  *** Cough: *** Sleeping: *** resp cc  SABA use: *** 02: ***  Lung cancer screening :  ***    No obvious day to day or daytime variability or assoc excess/ purulent sputum or mucus plugs or hemoptysis or cp or chest tightness, subjective wheeze or overt sinus or hb symptoms.    Also denies any obvious fluctuation of symptoms with weather or environmental changes or other aggravating or alleviating factors except as outlined above   No unusual exposure hx or h/o childhood pna/ asthma or knowledge of premature birth.  Current Allergies, Complete  Past Medical History, Past Surgical  History, Family History, and Social History were reviewed in [employer] record.  ROS  The following are not active complaints unless bolded Hoarseness, sore throat, dysphagia, dental problems, itching, sneezing,  nasal congestion or discharge of excess mucus or purulent secretions, ear ache,   fever, chills, sweats, unintended wt loss or wt gain, classically pleuritic or exertional cp,  orthopnea pnd or arm/hand swelling  or leg swelling, presyncope, palpitations, abdominal pain, anorexia, nausea, vomiting, diarrhea  or change in bowel habits or change in bladder habits, change in stools or change in urine, dysuria, hematuria,  rash, arthralgias, visual complaints, headache, numbness, weakness or ataxia or problems with walking or coordination,  change in mood or  memory.        No outpatient medications have been marked as taking for the 03/25/23 encounter (Appointment) with Nyoka Cowden, MD.              Objective:   Physical Exam  Wts  03/25/2023     ***  01/29/2023   314   10/08/2022   327  08/27/2022     314  11/12/2021   354  11/12/2020   342 05/13/2020 325  04/01/2020  330   02/23/2019    354   08/30/2018  366   08/02/2018  360   11/30/2014   343   Vital signs reviewed  03/25/2023  - Note at rest 02 sats  ***% on ***   General appearance:    ***

## 2023-03-30 ENCOUNTER — Encounter: Payer: Self-pay | Admitting: Family Medicine

## 2023-03-30 ENCOUNTER — Other Ambulatory Visit: Payer: Self-pay | Admitting: *Deleted

## 2023-03-30 ENCOUNTER — Telehealth: Payer: Self-pay

## 2023-03-30 ENCOUNTER — Ambulatory Visit: Payer: Medicare PPO | Admitting: Family Medicine

## 2023-03-30 ENCOUNTER — Telehealth: Payer: Self-pay | Admitting: *Deleted

## 2023-03-30 VITALS — BP 100/52 | HR 78 | Temp 97.0°F | Wt 325.6 lb

## 2023-03-30 DIAGNOSIS — Z8709 Personal history of other diseases of the respiratory system: Secondary | ICD-10-CM | POA: Diagnosis not present

## 2023-03-30 DIAGNOSIS — I5030 Unspecified diastolic (congestive) heart failure: Secondary | ICD-10-CM | POA: Diagnosis not present

## 2023-03-30 DIAGNOSIS — J9611 Chronic respiratory failure with hypoxia: Secondary | ICD-10-CM | POA: Diagnosis not present

## 2023-03-30 DIAGNOSIS — Z6841 Body Mass Index (BMI) 40.0 and over, adult: Secondary | ICD-10-CM

## 2023-03-30 DIAGNOSIS — J9612 Chronic respiratory failure with hypercapnia: Secondary | ICD-10-CM

## 2023-03-30 DIAGNOSIS — I10 Essential (primary) hypertension: Secondary | ICD-10-CM | POA: Diagnosis not present

## 2023-03-30 DIAGNOSIS — I700 Atherosclerosis of aorta: Secondary | ICD-10-CM

## 2023-03-30 DIAGNOSIS — N183 Chronic kidney disease, stage 3 unspecified: Secondary | ICD-10-CM

## 2023-03-30 MED ORDER — EZETIMIBE 10 MG PO TABS
10.0000 mg | ORAL_TABLET | Freq: Every day | ORAL | 3 refills | Status: DC
Start: 2023-03-30 — End: 2024-03-17

## 2023-03-30 NOTE — Progress Notes (Signed)
Established Patient Office Visit  Subjective   Patient ID: Pamela [name], female    DOB: 1951-05-10  Age: 72 y.o. MRN: [id number]    Jocey [name] is a 72 year old female with a medical history significant for essential hypertension, hypokalemia, chronic kidney disease, morbid obesity, moderate persistent asthma, history of lung cancer, and history of COPD presents for follow-up of chronic conditions. Patient states that she has been doing well and does not have any complaints on today.  Patient was recently hospitalized for COPD/asthma exacerbation.  Patient was treated with IV steroids and discharged home in stable condition.  She states that she has been taking medications consistently and is scheduled to follow-up with her pulmonologist. Today, patient denies any shortness of breath, wheezing, or persistent cough.  Patient is not having any pain on today.  Patient is up-to-date with her vaccinations. Patient has been taking all medications consistently.  She does not check blood pressure at home.  She denies any chest pain, heart palpitations, dizziness.  Patient endorses lower extremity swelling.    Past Medical History:  Diagnosis Date   Arthritis    Asthma    Cancer (HCC)    lung, adenocarcinoma right lung 2012   CHF (congestive heart failure) (HCC)    Preserved EF   Congenital single kidney    With chronic kidney disease   COPD (chronic obstructive pulmonary disease) (HCC)    Gout    Hypertension    Lung cancer (HCC) 2012   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 Patient notes it was consistent with sarcoidosis but actual pathology results not available.   Oxygen deficiency    Sarcoidosis    Sickle cell trait (HCC)    Sleep apnea     Social History   Socioeconomic History   Marital status:  Divorced    Spouse name: Not on file   Number of children: 2   Years of education: Not on file   Highest education level: Not on file  Occupational History   Not on file  Tobacco Use   Smoking status: Never   Smokeless tobacco: Never  Vaping Use   Vaping status: Never Used  Substance and Sexual Activity   Alcohol use: No    Alcohol/week: 0.0 standard drinks of alcohol   Drug use: No   Sexual activity: Not Currently  Other Topics Concern   Not on file  Social History Narrative   Lives with son.  One son is deceased.     Social Determinants of Health   Financial Resource Strain: Low Risk  (07/15/2022)   Overall Financial Resource Strain (CARDIA)    Difficulty of Paying Living Expenses: Not hard at all  Food Insecurity: No Food Insecurity (03/04/2023)   Hunger Vital Sign    Worried About Running Out of Food in the Last Year: Never true    Ran Out of Food in the Last Year: Never true  Transportation Needs: No Transportation Needs (03/04/2023)   PRAPARE - Administrator, Civil Service (Medical): No    Lack of Transportation (Non-Medical): No  Physical Activity: Inactive (07/15/2022)   Exercise Vital Sign    Days of Exercise per Week: 0 days    Minutes of Exercise per Session: 0 min  Stress: No Stress Concern Present (07/15/2022)  [employer] of Occupational Health - Occupational Stress Questionnaire    Feeling of Stress : Not at all  Social Connections: Moderately Integrated (07/15/2022)   Social Connection and Isolation Panel [NHANES]    Frequency of Communication with Friends and Family: More than three times a week    Frequency of Social Gatherings with Friends and Family: More than three times a week    Attends Religious Services: More than 4 times per year    Active Member of Clubs or Organizations: Yes    Attends Banker Meetings: More than 4 times per year    Marital Status: Divorced  Intimate Partner Violence: Not At Risk (02/24/2023)    Humiliation, Afraid, Rape, and Kick questionnaire    Fear of Current or Ex-Partner: No    Emotionally Abused: No    Physically Abused: No    Sexually Abused: No   Immunization History  Administered Date(s) Administered   Influenza,inj,Quad PF,6+ Mos 04/13/2014, 04/16/2015, 04/03/2016, 05/19/2017, 06/15/2018, 05/14/2019, 04/21/2022   PFIZER(Purple Top)SARS-COV-2 Vaccination 10/14/2019, 11/08/2019, 07/08/2020, 03/06/2021   Pneumococcal Conjugate-13 04/03/2016   Pneumococcal Polysaccharide-23 04/13/2014, 12/26/2019   Tdap 01/02/2015   Zoster, Live 05/21/2014   Allergies  Allergen Reactions   Sulfa Antibiotics Hives and Rash     Review of Systems  Constitutional:  Negative for chills and fever.  HENT: Negative.    Respiratory: Negative.    Cardiovascular: Negative.   Genitourinary: Negative.   Musculoskeletal:  Positive for back pain and joint pain.  Skin: Negative.   Neurological: Negative.       Objective:     BP (!) 100/52   Pulse 78   Temp (!) 97 F (36.1 C)   Wt (!) 325 lb 9.6 oz (147.7 kg)   SpO2 100%   BMI 55.89 kg/m  BP Readings from Last 3 Encounters:  03/30/23 (!) 100/52  03/03/23 118/71  01/29/23 116/68   Wt Readings from Last 3 Encounters:  03/30/23 (!) 325 lb 9.6 oz (147.7 kg)  02/23/23 (!) 314 lb 13.1 oz (142.8 kg)  01/29/23 (!) 314 lb 12.8 oz (142.8 kg)      Physical Exam Constitutional:      Appearance: She is obese.  Eyes:     Pupils: Pupils are equal, round, and reactive to light.  Cardiovascular:     Rate and Rhythm: Normal rate and regular rhythm.     Pulses: Normal pulses.  Pulmonary:     Effort: Pulmonary effort is normal.     Breath sounds: Wheezing present.  Abdominal:     General: Bowel sounds are normal.  Musculoskeletal:     Right lower leg: Edema present.     Left lower leg: Edema present.  Skin:    General: Skin is warm.  Neurological:     Mental Status: Mental status is at baseline.  Psychiatric:        Mood and  Affect: Mood normal.        Behavior: Behavior normal.        Thought Content: Thought content normal.        Judgment: Judgment normal.      Results for orders placed or performed in visit on 03/30/23  Urinalysis, Routine w reflex microscopic  Result Value Ref Range   Specific Gravity, UA 1.014 1.005 - 1.030   pH, UA 6.5 5.0 - 7.5   Color, UA Yellow Yellow   Appearance Ur Clear Clear   Leukocytes,UA Negative Negative   Protein,UA Negative Negative/Trace  Glucose, UA Negative Negative   Ketones, UA Negative Negative   RBC, UA Negative Negative   Bilirubin, UA Negative Negative   Urobilinogen, Ur 0.2 0.2 - 1.0 mg/dL   Nitrite, UA Negative Negative   Microscopic Examination Comment     Last CBC Lab Results  Component Value Date   WBC 6.2 03/01/2023   HGB 11.2 (L) 03/01/2023   HCT 38.0 03/01/2023   MCV 73.1 (L) 03/01/2023   MCH 21.5 (L) 03/01/2023   RDW 14.4 03/01/2023   PLT 143 (L) 03/01/2023   Last metabolic panel Lab Results  Component Value Date   GLUCOSE 84 03/24/2023   NA 140 03/24/2023   K 4.1 03/24/2023   CL 102 03/24/2023   CO2 27 03/24/2023   BUN 23 03/24/2023   CREATININE 1.38 (H) 03/24/2023   EGFR 41 (L) 03/24/2023   CALCIUM 8.4 (L) 03/24/2023   PHOS 2.9 02/26/2023   PROT 5.6 (L) 03/24/2023   ALBUMIN 3.4 (L) 03/24/2023   LABGLOB 2.2 03/24/2023   AGRATIO 1.7 10/27/2022   BILITOT 0.4 03/24/2023   ALKPHOS 189 (H) 03/24/2023   AST 39 03/24/2023   ALT 37 (H) 03/24/2023   ANIONGAP 12 03/03/2023   Last lipids Lab Results  Component Value Date   CHOL 171 03/24/2023   HDL 60 03/24/2023   LDLCALC 97 03/24/2023   TRIG 72 03/24/2023   CHOLHDL 2.9 03/24/2023   Last hemoglobin A1c Lab Results  Component Value Date   HGBA1C 5.6 09/24/2022   Last thyroid functions Lab Results  Component Value Date   TSH 2.810 09/24/2022   T4TOTAL 6.8 09/24/2022   Last vitamin D Lab Results  Component Value Date   VD25OH 8.3 (L) 01/29/2022   Last vitamin  B12 and Folate Lab Results  Component Value Date   VITAMINB12 558 02/28/2023   FOLATE 5.3 (L) 02/28/2023      The ASCVD Risk score (Arnett DK, et al., 2019) failed to calculate for the following reasons:   The patient has a prior MI or stroke diagnosis    Assessment & Plan:   Problem List Items Addressed This Visit       Cardiovascular and Mediastinum   Essential hypertension - Primary   Relevant Orders   Urinalysis, Routine w reflex microscopic (Completed)     Respiratory   Chronic respiratory failure with hypoxia and hypercapnia (HCC)     Genitourinary   Chronic kidney disease   Other Visit Diagnoses     BMI 50.0-59.9, adult (HCC)       History of COPD       Diastolic congestive heart failure, unspecified HF chronicity (HCC)          1. Essential hypertension BP (!) 100/52   Pulse 78   Temp (!) 97 F (36.1 C)   Wt (!) 325 lb 9.6 oz (147.7 kg)   SpO2 100%   BMI 55.89 kg/m  - Continue medication, monitor blood pressure at home. Continue DASH diet.  Reminder to go to the ER if any CP, SOB, nausea, dizziness, severe HA, changes vision/speech, left arm numbness and tingling and jaw pain.   - Urinalysis, Routine w reflex microscopic  2. BMI 50.0-59.9, adult [facility]) The patient is asked to make an attempt to improve diet and exercise patterns to aid in medical management of this problem.   3. Stage 3 chronic kidney disease, unspecified whether stage 3a or 3b CKD (HCC) Reviewed labs.  Stable.  Follow-up with nephrology as scheduled  4. History of COPD Patient to follow-up with pulmonology as scheduled  5. Chronic respiratory failure with hypoxia and hypercapnia (HCC) Continue home oxygen 2 L/min  6. Diastolic congestive heart failure, unspecified HF chronicity (HCC) Stable.  Compensated.  Follow-up with cardiology. Return in about 1 week (around 04/06/2023) for hypertension.    Nolon Nations  APRN, MSN, FNP-C Patient Care [facility]  Group [address] [place], [place] [address] [phone]

## 2023-03-30 NOTE — Patient Instructions (Signed)
Furosemide 40 mg every 12 hours for 5 days. Return to clinic in 1 week to check potassium level.

## 2023-03-30 NOTE — Telephone Encounter (Signed)
Verbal for PT was given . Called [phone] [place] .  Kh

## 2023-03-30 NOTE — Telephone Encounter (Signed)
S/w pt about either repatha or nexzilet pt does not want to do injection but is willing to start zetia 10 mg daily.  Sent to requested pharmacy. Lipid/alt ordered for December 11 and linked. Appt made and calendar with appt mailed to pt. Address confirmed.

## 2023-03-31 DIAGNOSIS — J9622 Acute and chronic respiratory failure with hypercapnia: Secondary | ICD-10-CM | POA: Diagnosis not present

## 2023-03-31 DIAGNOSIS — J9621 Acute and chronic respiratory failure with hypoxia: Secondary | ICD-10-CM | POA: Diagnosis not present

## 2023-03-31 DIAGNOSIS — I5033 Acute on chronic diastolic (congestive) heart failure: Secondary | ICD-10-CM | POA: Diagnosis not present

## 2023-03-31 DIAGNOSIS — I130 Hypertensive heart and chronic kidney disease with heart failure and stage 1 through stage 4 chronic kidney disease, or unspecified chronic kidney disease: Secondary | ICD-10-CM | POA: Diagnosis not present

## 2023-03-31 DIAGNOSIS — J441 Chronic obstructive pulmonary disease with (acute) exacerbation: Secondary | ICD-10-CM | POA: Diagnosis not present

## 2023-03-31 DIAGNOSIS — J4489 Other specified chronic obstructive pulmonary disease: Secondary | ICD-10-CM | POA: Diagnosis not present

## 2023-03-31 DIAGNOSIS — J4541 Moderate persistent asthma with (acute) exacerbation: Secondary | ICD-10-CM | POA: Diagnosis not present

## 2023-03-31 DIAGNOSIS — N1832 Chronic kidney disease, stage 3b: Secondary | ICD-10-CM | POA: Diagnosis not present

## 2023-03-31 DIAGNOSIS — D631 Anemia in chronic kidney disease: Secondary | ICD-10-CM | POA: Diagnosis not present

## 2023-04-03 DIAGNOSIS — J9612 Chronic respiratory failure with hypercapnia: Secondary | ICD-10-CM | POA: Diagnosis not present

## 2023-04-03 DIAGNOSIS — J9610 Chronic respiratory failure, unspecified whether with hypoxia or hypercapnia: Secondary | ICD-10-CM | POA: Diagnosis not present

## 2023-04-03 DIAGNOSIS — J449 Chronic obstructive pulmonary disease, unspecified: Secondary | ICD-10-CM | POA: Diagnosis not present

## 2023-04-04 DIAGNOSIS — J441 Chronic obstructive pulmonary disease with (acute) exacerbation: Secondary | ICD-10-CM | POA: Diagnosis not present

## 2023-04-04 DIAGNOSIS — D631 Anemia in chronic kidney disease: Secondary | ICD-10-CM | POA: Diagnosis not present

## 2023-04-04 DIAGNOSIS — J9622 Acute and chronic respiratory failure with hypercapnia: Secondary | ICD-10-CM | POA: Diagnosis not present

## 2023-04-04 DIAGNOSIS — I5033 Acute on chronic diastolic (congestive) heart failure: Secondary | ICD-10-CM | POA: Diagnosis not present

## 2023-04-04 DIAGNOSIS — J4489 Other specified chronic obstructive pulmonary disease: Secondary | ICD-10-CM | POA: Diagnosis not present

## 2023-04-04 DIAGNOSIS — J4541 Moderate persistent asthma with (acute) exacerbation: Secondary | ICD-10-CM | POA: Diagnosis not present

## 2023-04-04 DIAGNOSIS — I130 Hypertensive heart and chronic kidney disease with heart failure and stage 1 through stage 4 chronic kidney disease, or unspecified chronic kidney disease: Secondary | ICD-10-CM | POA: Diagnosis not present

## 2023-04-04 DIAGNOSIS — J9621 Acute and chronic respiratory failure with hypoxia: Secondary | ICD-10-CM | POA: Diagnosis not present

## 2023-04-04 DIAGNOSIS — N1832 Chronic kidney disease, stage 3b: Secondary | ICD-10-CM | POA: Diagnosis not present

## 2023-04-06 ENCOUNTER — Ambulatory Visit: Payer: Self-pay

## 2023-04-06 ENCOUNTER — Other Ambulatory Visit: Payer: Medicare PPO

## 2023-04-06 DIAGNOSIS — I10 Essential (primary) hypertension: Secondary | ICD-10-CM | POA: Diagnosis not present

## 2023-04-06 NOTE — Patient Outreach (Signed)
Care Coordination   Follow Up Visit Note   04/06/2023 Name: Pamela [name] MRN: [id number] DOB: 1951-02-28  Pamela [name] is a 72 y.o. year old female who sees Massie Maroon, FNP for primary care. I spoke with  [name] by phone today.  What matters to the patients health and wellness today?  PCP office visit completed today. Per patient, furosemide was increased to twice a day. She reports she also received Ezitimibe in the mail today to start. She is without questions or concerns today.  Goals Addressed             This Visit's Progress    continue to improve post hospitalization       Interventions Today    Flowsheet Row Most Recent Value  Chronic Disease   Chronic disease during today's visit Congestive Heart Failure (CHF), Chronic Obstructive Pulmonary Disease (COPD), Chronic Kidney Disease/End Stage Renal Disease (ESRD), Other, Hypertension (HTN)  [aortic atherosclerosis]  General Interventions   General Interventions Discussed/Reviewed General Interventions Reviewed, Doctor Visits  Doctor Visits Discussed/Reviewed Doctor Visits Discussed, Doctor Visits Reviewed, PCP, Specialist  Durable Medical Equipment (DME) Oxygen  [O2 at 3L/[place]]  PCP/Specialist Visits Compliance with follow-up visit  [reviewed upcoming/follow up appointments, encouraged to attend as scheduled/recommended. per patient an appoinment with nephrology scheduled for next month also.]  Exercise Interventions   Exercise Discussed/Reviewed Exercise Reviewed  [reiterated work with HHPT as recommended. patient reports [facility].]  Education Interventions   Education Provided Provided Education, Provided Printed Education  [mailed emmi article-can foods or supplements lower cholesterol,  cholesterol test,  high cholesterol,  low fat diet]  Provided Verbal Education On When to see the doctor, Medication, Exercise  Nutrition Interventions   Nutrition Discussed/Reviewed Nutrition Discussed, Decreasing fats   Pharmacy Interventions   Pharmacy Dicussed/Reviewed Pharmacy Topics Reviewed, Medications and their functions  [medications reviewed. discussed Ezitimibe and function.]  Safety Interventions   Safety Discussed/Reviewed Safety Reviewed            SDOH assessments and interventions completed:  No  Care Coordination Interventions:  Yes, provided   Follow up plan: Follow up call scheduled for 05/06/23    Encounter Outcome:  Patient Visit Completed   Kathyrn Sheriff, RN, MSN, BSN, CCM Care Management Coordinator [phone]

## 2023-04-06 NOTE — Patient Instructions (Signed)
Visit Information  Thank you for taking time to visit with me today. Please don't hesitate to contact me if I can be of assistance to you.   Following are the goals we discussed today:  Continue to take medications as prescribed. Continue to attend provider visits as scheduled Continue to eat healthy, lean meats, vegetables, fruits, avoid saturated and transfats Review educational material sent via mail. Contact RN Care Manager with questions Continue to work with home health physical therapist as recommended   Our next appointment is by telephone on 05/06/23 at 1:15 pm  Please call the care guide team at [phone] if you need to cancel or reschedule your appointment.   If you are experiencing a Mental Health or Behavioral Health Crisis or need someone to talk to, please call the Suicide and Crisis Lifeline: 988 call the [place] National Suicide Prevention Lifeline: (915)497-7331 or TTY: 407-174-0880 TTY 409-286-9848) to talk to a trained counselor call 1-800-273-TALK (toll free, 24 hour hotline)   Kathyrn Sheriff, RN, MSN, BSN, CCM Care Management Coordinator [phone]

## 2023-04-07 DIAGNOSIS — I5033 Acute on chronic diastolic (congestive) heart failure: Secondary | ICD-10-CM | POA: Diagnosis not present

## 2023-04-07 DIAGNOSIS — D631 Anemia in chronic kidney disease: Secondary | ICD-10-CM | POA: Diagnosis not present

## 2023-04-07 DIAGNOSIS — J441 Chronic obstructive pulmonary disease with (acute) exacerbation: Secondary | ICD-10-CM | POA: Diagnosis not present

## 2023-04-07 DIAGNOSIS — N1832 Chronic kidney disease, stage 3b: Secondary | ICD-10-CM | POA: Diagnosis not present

## 2023-04-07 DIAGNOSIS — J9621 Acute and chronic respiratory failure with hypoxia: Secondary | ICD-10-CM | POA: Diagnosis not present

## 2023-04-07 DIAGNOSIS — J4489 Other specified chronic obstructive pulmonary disease: Secondary | ICD-10-CM | POA: Diagnosis not present

## 2023-04-07 DIAGNOSIS — J9622 Acute and chronic respiratory failure with hypercapnia: Secondary | ICD-10-CM | POA: Diagnosis not present

## 2023-04-07 DIAGNOSIS — J4541 Moderate persistent asthma with (acute) exacerbation: Secondary | ICD-10-CM | POA: Diagnosis not present

## 2023-04-07 DIAGNOSIS — I130 Hypertensive heart and chronic kidney disease with heart failure and stage 1 through stage 4 chronic kidney disease, or unspecified chronic kidney disease: Secondary | ICD-10-CM | POA: Diagnosis not present

## 2023-04-09 DIAGNOSIS — I5032 Chronic diastolic (congestive) heart failure: Secondary | ICD-10-CM | POA: Diagnosis not present

## 2023-04-09 DIAGNOSIS — J9601 Acute respiratory failure with hypoxia: Secondary | ICD-10-CM | POA: Diagnosis not present

## 2023-04-09 DIAGNOSIS — J441 Chronic obstructive pulmonary disease with (acute) exacerbation: Secondary | ICD-10-CM | POA: Diagnosis not present

## 2023-04-13 DIAGNOSIS — J9621 Acute and chronic respiratory failure with hypoxia: Secondary | ICD-10-CM | POA: Diagnosis not present

## 2023-04-13 DIAGNOSIS — J9601 Acute respiratory failure with hypoxia: Secondary | ICD-10-CM | POA: Diagnosis not present

## 2023-04-13 DIAGNOSIS — N1832 Chronic kidney disease, stage 3b: Secondary | ICD-10-CM | POA: Diagnosis not present

## 2023-04-13 DIAGNOSIS — I5033 Acute on chronic diastolic (congestive) heart failure: Secondary | ICD-10-CM | POA: Diagnosis not present

## 2023-04-13 DIAGNOSIS — J9622 Acute and chronic respiratory failure with hypercapnia: Secondary | ICD-10-CM | POA: Diagnosis not present

## 2023-04-13 DIAGNOSIS — J4489 Other specified chronic obstructive pulmonary disease: Secondary | ICD-10-CM | POA: Diagnosis not present

## 2023-04-13 DIAGNOSIS — I5032 Chronic diastolic (congestive) heart failure: Secondary | ICD-10-CM | POA: Diagnosis not present

## 2023-04-13 DIAGNOSIS — J441 Chronic obstructive pulmonary disease with (acute) exacerbation: Secondary | ICD-10-CM | POA: Diagnosis not present

## 2023-04-13 DIAGNOSIS — I130 Hypertensive heart and chronic kidney disease with heart failure and stage 1 through stage 4 chronic kidney disease, or unspecified chronic kidney disease: Secondary | ICD-10-CM | POA: Diagnosis not present

## 2023-04-13 DIAGNOSIS — D631 Anemia in chronic kidney disease: Secondary | ICD-10-CM | POA: Diagnosis not present

## 2023-04-13 DIAGNOSIS — J4541 Moderate persistent asthma with (acute) exacerbation: Secondary | ICD-10-CM | POA: Diagnosis not present

## 2023-04-13 DIAGNOSIS — J45909 Unspecified asthma, uncomplicated: Secondary | ICD-10-CM | POA: Diagnosis not present

## 2023-04-19 DIAGNOSIS — G4733 Obstructive sleep apnea (adult) (pediatric): Secondary | ICD-10-CM | POA: Diagnosis not present

## 2023-04-30 DIAGNOSIS — I5033 Acute on chronic diastolic (congestive) heart failure: Secondary | ICD-10-CM | POA: Diagnosis not present

## 2023-04-30 DIAGNOSIS — N1832 Chronic kidney disease, stage 3b: Secondary | ICD-10-CM | POA: Diagnosis not present

## 2023-04-30 DIAGNOSIS — D631 Anemia in chronic kidney disease: Secondary | ICD-10-CM | POA: Diagnosis not present

## 2023-04-30 DIAGNOSIS — J9621 Acute and chronic respiratory failure with hypoxia: Secondary | ICD-10-CM | POA: Diagnosis not present

## 2023-04-30 DIAGNOSIS — J9622 Acute and chronic respiratory failure with hypercapnia: Secondary | ICD-10-CM | POA: Diagnosis not present

## 2023-04-30 DIAGNOSIS — J4489 Other specified chronic obstructive pulmonary disease: Secondary | ICD-10-CM | POA: Diagnosis not present

## 2023-04-30 DIAGNOSIS — I130 Hypertensive heart and chronic kidney disease with heart failure and stage 1 through stage 4 chronic kidney disease, or unspecified chronic kidney disease: Secondary | ICD-10-CM | POA: Diagnosis not present

## 2023-04-30 DIAGNOSIS — J441 Chronic obstructive pulmonary disease with (acute) exacerbation: Secondary | ICD-10-CM | POA: Diagnosis not present

## 2023-04-30 DIAGNOSIS — J4541 Moderate persistent asthma with (acute) exacerbation: Secondary | ICD-10-CM | POA: Diagnosis not present

## 2023-05-03 ENCOUNTER — Telehealth: Payer: Self-pay

## 2023-05-03 DIAGNOSIS — J9610 Chronic respiratory failure, unspecified whether with hypoxia or hypercapnia: Secondary | ICD-10-CM | POA: Diagnosis not present

## 2023-05-03 DIAGNOSIS — J9612 Chronic respiratory failure with hypercapnia: Secondary | ICD-10-CM | POA: Diagnosis not present

## 2023-05-03 DIAGNOSIS — J449 Chronic obstructive pulmonary disease, unspecified: Secondary | ICD-10-CM | POA: Diagnosis not present

## 2023-05-03 NOTE — Telephone Encounter (Signed)
[facility] from [facility] called for [facility] PT 2x a week for 1 week. Pease advise for call back [phone]

## 2023-05-04 ENCOUNTER — Ambulatory Visit (INDEPENDENT_AMBULATORY_CARE_PROVIDER_SITE_OTHER): Payer: Medicare PPO | Admitting: Adult Health

## 2023-05-04 ENCOUNTER — Encounter: Payer: Self-pay | Admitting: Adult Health

## 2023-05-04 VITALS — BP 106/64 | HR 69 | Temp 97.9°F | Ht 64.0 in | Wt 320.0 lb

## 2023-05-04 DIAGNOSIS — J4530 Mild persistent asthma, uncomplicated: Secondary | ICD-10-CM | POA: Diagnosis not present

## 2023-05-04 DIAGNOSIS — J9612 Chronic respiratory failure with hypercapnia: Secondary | ICD-10-CM

## 2023-05-04 DIAGNOSIS — C3490 Malignant neoplasm of unspecified part of unspecified bronchus or lung: Secondary | ICD-10-CM

## 2023-05-04 DIAGNOSIS — N1832 Chronic kidney disease, stage 3b: Secondary | ICD-10-CM | POA: Diagnosis not present

## 2023-05-04 DIAGNOSIS — I5032 Chronic diastolic (congestive) heart failure: Secondary | ICD-10-CM | POA: Diagnosis not present

## 2023-05-04 DIAGNOSIS — J9611 Chronic respiratory failure with hypoxia: Secondary | ICD-10-CM

## 2023-05-04 MED ORDER — ALBUTEROL SULFATE HFA 108 (90 BASE) MCG/ACT IN AERS: 2.0000 | INHALATION_SPRAY | RESPIRATORY_TRACT | 5 refills | Status: DC | PRN

## 2023-05-04 NOTE — Assessment & Plan Note (Signed)
Hypoxic and hypercarbic respiratory failure with underlying obstructive sleep apnea and OHS.  Patient is continue on oxygen to maintain O2 saturations greater than 88%.  Continue on BiPAP ST support at bedtime.  BiPAP download shows excellent compliance.  Continue on current settings  Plan  Patient Instructions  Continue on Budesonide and Albuterol neb Twice daily   Singulair 10mg  At bedtime  Zyrtec 10mg  daily  Albuterol inhaler or neb As needed   Continue on Oxygen 3l/m  Continue on Lasix 40mg  daily  Continue with follow up with Cardiology and Nephrology  Continue on BIPAP ST machine At bedtime  and with naps.  Follow up with in 2 months with Dr. Sherene Sires  and As needed   Please contact office for sooner follow up if symptoms do not improve or worsen or seek emergency care

## 2023-05-04 NOTE — Assessment & Plan Note (Signed)
Continue follow-up with nephrology.

## 2023-05-04 NOTE — Assessment & Plan Note (Signed)
History of lung cancer follow-up CT chest May 24 showed no evidence of recurrent or metastatic disease

## 2023-05-04 NOTE — Progress Notes (Signed)
@Patient  ID: [name], female    DOB: [birth year], 72 y.o.   MRN: [id number]  Chief Complaint  Patient presents with   Hospitalization Follow-up    Referring provider: Massie Maroon, FNP  HPI: 72 year old female never smoker followed for asthma with allergic phenotype, sarcoidosis, history of lung cancer (dx in 2012 s/p SBRT) Chronic respiratory failure on Oxygen 2013 -2018, restarted 03/2022  Medical history significant for right anterior chest wall mass/right pectoralis minor muscle mass (biopsy done in 2015 nondiagnostic, PET scan October 2016 shows no change in metabolic activity or size suggestive of benign etiology, chronic kidney disease, congestive heart failure, PFO on Echo -bubble study , Prior CVA   TEST/EVENTS :  CT chest Nov 20, 2021 similar posttreatment fibrosis/consolidation with volume loss in the right upper lobe.  No evidence of recurrence, tiny right upper lobe pulmonary nodule no suspicious pulmonary nodules.  A 5.5 x 2.1 cm soft tissue mass in the right pectoralis major and minor musculature.    pfts 07/31/13  FEV1  1.53 (58%) with ratio 66 > p saba ratio 74 FEV1 1.68 (64%)    FENO 06/01/2016  =   85    Spirometry 02/22/2018  FEV1 1.23 (57%)  Ratio 71 with min curvature  - FENO 02/22/2018  = 61  05/2022 PFTs today show severe restriction with FEV1 at 51%, ratio 85, FVC 46%, no significant bronchodilator response, DLCO 69%.    Split night sleep study in 07/2014 showed mild OSA only with AHI 6.9. O2 sats nml on 2l/m .    She also carries dx of Sarcoid from previous bx. (followed previously by Dr. Evelena Peat at [facility], with lymphadenopathy in chest , abd w/ possible spleen and liver involvement.    03/2022 CT chest neg for PE, stable RUL scarring likely radiation changes.   CT chest Nov 30, 2022 stable platelike scarring/radiation changes in the right upper lobe, no evidence of recurrent or metastatic disease  11/2022 Absolute Eosinophil  1,500  05/04/2023 Follow up : Asthma , O2 RF , hx of Lung cancer , Sarcoid , OSA/OHS , Post hospital follow up  Patient presents for a posthospital follow-up.  Patient was admitted in August for an acute asthma exacerbation, acute on chronic hypoxic and hypercarbic respiratory failure.  He was treated with empiric antibiotics and steroids.  Also has decompensated diastolic heart failure that improved with diuresis.  On admission patient was very hypercarbic with a pCO2 at 103 and a pH at 7.25.  He was initially treated with BiPAP support.   Patient clinically improved and was discharged on prednisone taper along with BiPAP ST.  Patient says since discharge he is doing better.  Does feel that since she started on BiPAP at bedtime that she is sleeping better and breathing is doing some better.  Patient was discharged on a BiPAP ST -IVAPS mode current settings are EPAP 5 cm H2O.,  Max pressure support 20 minimum pressure support 6 cm H2O.,  Backup rate at 12 breaths/min Download shows excellent compliance with daily average usage at 7.5 hours.  AHI 2.5.  Patient remains on budesonide and albuterol nebulizer twice daily.  He takes Singulair and Zyrtec daily.  Remains on oxygen 3 L.  Denies any chest pain, cough, discolored mucus or fever    Allergies  Allergen Reactions   Sulfa Antibiotics Hives and Rash    Immunization History  Administered Date(s) Administered   Influenza,inj,Quad PF,6+ Mos 04/13/2014, 04/16/2015, 04/03/2016, 05/19/2017, 06/15/2018, 05/14/2019, 04/21/2022  PFIZER(Purple Top)SARS-COV-2 Vaccination 10/14/2019, 11/08/2019, 07/08/2020, 03/06/2021   Pneumococcal Conjugate-13 04/03/2016   Pneumococcal Polysaccharide-23 04/13/2014, 12/26/2019   Tdap 01/02/2015   Zoster, Live 05/21/2014    Past Medical History:  Diagnosis Date   Arthritis    Asthma    Cancer (HCC)    lung, adenocarcinoma right lung 2012   CHF (congestive heart failure) (HCC)    Preserved EF   Congenital single  kidney    With chronic kidney disease   COPD (chronic obstructive pulmonary disease) (HCC)    Gout    Hypertension    Lung cancer (HCC) 2012   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 Patient notes it was consistent with sarcoidosis but actual pathology results not available.   Oxygen deficiency    Sarcoidosis    Sickle cell trait (HCC)    Sleep apnea     Tobacco History: Social History   Tobacco Use  Smoking Status Never  Smokeless Tobacco Never   Counseling given: Not Answered   Outpatient Medications Prior to Visit  Medication Sig Dispense Refill   acetaminophen (TYLENOL) 500 MG tablet Take 1,000 mg by mouth every 6 (six) hours as needed for mild pain.     albuterol (PROVENTIL) (2.5 MG/3ML) 0.083% nebulizer solution USE 1 VIAL IN NEBULIZER EVERY 6 HOURS AS NEEDED FOR WHEEZING FOR SHORTNESS OF BREATH 300 mL 5   aspirin EC 81 MG tablet Take 1 tablet (81 mg total) by mouth daily. Swallow whole. 30 tablet 11   B Complex-C (B-COMPLEX WITH VITAMIN C) tablet Take 1 tablet by mouth daily. 120 tablet 0   budesonide (PULMICORT) 0.25 MG/2ML nebulizer solution One twice daily with albuterol 120 mL 12   calcitRIOL (ROCALTROL) 0.25 MCG capsule Take 1 capsule (0.25 mcg total) by mouth daily. 90 capsule 2   cetirizine (ZYRTEC) 10 MG chewable tablet Chew 10 mg by mouth daily.     diclofenac Sodium (VOLTAREN) 1 % GEL Apply 2 g topically 4 (four) times daily. 100 g 0   docusate sodium (STOOL SOFTENER) 100 MG capsule Take 100 mg by mouth daily.     ezetimibe (ZETIA) 10 MG tablet Take 1 tablet (10 mg total) by mouth daily. 90 tablet 3   famotidine (PEPCID) 20 MG tablet One after supper (Patient taking differently: Take 20 mg by mouth every evening. One after supper.  Takes PRN.) 30 tablet 11   febuxostat (ULORIC) 40 MG  tablet Take 1 tablet (40 mg total) by mouth daily. 90 tablet 1   folic acid (FOLVITE) 1 MG tablet Take 1 tablet (1 mg total) by mouth daily. 120 tablet 0   furosemide (LASIX) 40 MG tablet Take 1 tablet (40 mg total) by mouth daily. 90 tablet 1   hydrOXYzine (ATARAX) 25 MG tablet Take 1 tablet (25 mg total) by mouth 3 (three) times daily as needed for itching. 30 tablet 0   Incontinence Supply Disposable (DEPEND UNDERWEAR LARGE/XL) MISC 1 each by Does not apply route 2 times daily at 12 noon and 4 pm. 17 each 12   montelukast (SINGULAIR) 10 MG tablet Take 1 tablet (10 mg total) by mouth at bedtime. 90 tablet 3   pantoprazole (PROTONIX) 40 MG tablet TAKE 1 TABLET EVERY DAY 30 TO 60 MINUTES BEFORE FIRST MEAL OF THE DAY (Patient taking differently: Take  40 mg by mouth See admin instructions. TAKE 1 TABLET EVERY DAY 30 TO 60 MINUTES BEFORE FIRST MEAL OF THE DAY) 90 tablet 3   predniSONE (DELTASONE) 10 MG tablet Take 40mg  daily for 3days,Take 30mg  daily for 3days,Take 20mg  daily for 3days,Take 10mg  daily for 3days, then stop 30 tablet 0   albuterol (PROAIR HFA) 108 (90 Base) MCG/ACT inhaler Inhale 2 puffs into the lungs every 4 (four) hours as needed for wheezing or shortness of breath. 8 g 5   atorvastatin (LIPITOR) 40 MG tablet Take 1 tablet (40 mg total) by mouth daily. 90 tablet 3   dextromethorphan (DELSYM) 30 MG/5ML liquid Take 5 mLs (30 mg total) by mouth 2 (two) times daily. (Patient not taking: Reported on 04/06/2023) 89 mL 0   ferrous sulfate 325 (65 FE) MG tablet Take 1 tablet (325 mg total) by mouth daily with lunch. 30 tablet 0   guaiFENesin (MUCINEX) 600 MG 12 hr tablet Take 1 tablet (600 mg total) by mouth 2 (two) times daily. (Patient not taking: Reported on 05/04/2023) 60 tablet 0   benzonatate (TESSALON) 100 MG capsule Take 1 capsule (100 mg total) by mouth 3 (three) times daily. (Patient not taking: Reported on 04/06/2023) 20 capsule 0   No facility-administered medications prior to visit.      Review of Systems:   Constitutional:   No  weight loss, night sweats,  Fevers, chills, + fatigue, or  lassitude.  HEENT:   No headaches,  Difficulty swallowing,  Tooth/dental problems, or  Sore throat,                No sneezing, itching, ear ache, nasal congestion, post nasal drip,   CV:  No chest pain,  Orthopnea, PND, swelling in lower extremities, anasarca, dizziness, palpitations, syncope.   GI  No heartburn, indigestion, abdominal pain, nausea, vomiting, diarrhea, change in bowel habits, loss of appetite, bloody stools.   Resp:   No chest wall deformity  Skin: no rash or lesions.  GU: no dysuria, change in color of urine, no urgency or frequency.  No flank pain, no hematuria   MS:  No joint pain or swelling.  No decreased range of motion.  No back pain.    Physical Exam  BP 106/64 (BP Location: Left Wrist, Patient Position: Sitting, Cuff Size: Normal)   Pulse 69   Temp 97.9 F (36.6 C) (Oral)   Ht 5\' 4"  (1.626 m)   Wt (!) 320 lb (145.2 kg)   SpO2 99%   BMI 54.93 kg/m   GEN: A/Ox3; pleasant , NAD, well nourished , On O2    HEENT:  [place]/AT, , NOSE-clear, THROAT-clear, no lesions, no postnasal drip or exudate noted.   NECK:  Supple w/ fair ROM; no JVD; normal carotid impulses w/o bruits; no thyromegaly or nodules palpated; no lymphadenopathy.    RESP  Clear  P & A; w/o, wheezes/ rales/ or rhonchi. no accessory muscle use, no dullness to percussion  CARD:  RRR, no m/r/g, 1-2 + peripheral edema, pulses intact, no cyanosis or clubbing.  GI:   Soft & nt; nml bowel sounds; no organomegaly or masses detected.   Musco: Warm bil, no deformities or joint swelling noted.   Neuro: alert, no focal deficits noted.    Skin: Warm, no lesions or rashes    Lab Results:  CBC    Component Value Date/Time   WBC 6.2 03/01/2023 0311   RBC 5.20 (H) 03/01/2023 0311   HGB 11.2 (L) 03/01/2023  0311   HGB 10.9 (L) 12/18/2022 0920   HGB 11.0 (L) 08/29/2019 0941   HGB 11.8  05/31/2017 1050   HCT 38.0 03/01/2023 0311   HCT 37.1 01/29/2022 0940   HCT 37.5 05/31/2017 1050   PLT 143 (L) 03/01/2023 0311   PLT 163 12/18/2022 0920   PLT 195 08/29/2019 0941   MCV 73.1 (L) 03/01/2023 0311   MCV 72 (L) 08/29/2019 0941   MCV 71.2 (L) 05/31/2017 1050   MCH 21.5 (L) 03/01/2023 0311   MCHC 29.5 (L) 03/01/2023 0311   RDW 14.4 03/01/2023 0311   RDW 16.2 (H) 08/29/2019 0941   RDW 15.1 (H) 05/31/2017 1050   LYMPHSABS 1.4 12/18/2022 0920   LYMPHSABS 1.0 08/29/2019 0941   LYMPHSABS 1.2 05/31/2017 1050   MONOABS 0.5 12/18/2022 0920   MONOABS 0.4 05/31/2017 1050   EOSABS 1.5 (H) 12/18/2022 0920   EOSABS 1.0 (H) 08/29/2019 0941   BASOSABS 0.1 12/18/2022 0920   BASOSABS 0.0 08/29/2019 0941   BASOSABS 0.0 05/31/2017 1050    BMET    Component Value Date/Time   NA 140 03/24/2023 0906   NA 142 05/31/2017 1050   K 4.1 03/24/2023 0906   K 4.2 05/31/2017 1050   CL 102 03/24/2023 0906   CL 101 09/20/2018 0000   CO2 27 03/24/2023 0906   CO2 27 09/20/2018 0000   CO2 30 (H) 05/31/2017 1050   GLUCOSE 84 03/24/2023 0906   GLUCOSE 85 03/03/2023 0917   GLUCOSE 94 05/31/2017 1050   BUN 23 03/24/2023 0906   BUN 26.0 05/31/2017 1050   CREATININE 1.38 (H) 03/24/2023 0906   CREATININE 1.59 (H) 12/18/2022 0920   CREATININE 1.6 (H) 05/31/2017 1050   CALCIUM 8.4 (L) 03/24/2023 0906   CALCIUM 8.2 09/20/2018 0000   CALCIUM 9.0 05/31/2017 1050   GFRNONAA 45 (L) 03/03/2023 0917   GFRNONAA 35 (L) 12/18/2022 0920   GFRNONAA 36 (L) 05/19/2017 1210   GFRAA 20 (L) 04/26/2020 1011   GFRAA 37 (L) 06/23/2018 1300   GFRAA 41 (L) 05/19/2017 1210    BNP    Component Value Date/Time   BNP 23.4 02/23/2023 1256   BNP 65.7 08/31/2014 0937    ProBNP    Component Value Date/Time   PROBNP 494 (H) 04/26/2020 1011   PROBNP 1,193.0 (H) 06/11/2014 1929    Imaging: No results found.  Administration History     None          Latest Ref Rng & Units 05/22/2022    8:13 AM  08/30/2018   10:03 AM 07/19/2014   11:01 AM  PFT Results  FVC-Pre L 1.41  1.92  2.32   FVC-Predicted Pre % 42  69  85   FVC-Post L 1.51  2.13  2.36   FVC-Predicted Post % 46  77  86   Pre FEV1/FVC % % 84  83  71   Post FEV1/FCV % % 85  79  85   FEV1-Pre L 1.18  1.59  1.64   FEV1-Predicted Pre % 47  74  76   FEV1-Post L 1.29  1.69  2.00   DLCO uncorrected ml/min/mmHg 14.60  23.90  18.43   DLCO UNC% % 69  112  71   DLCO corrected ml/min/mmHg 14.60     DLCO COR %Predicted % 69     DLVA Predicted % 111  154  101   TLC L 3.59     TLC % Predicted % 67  RV % Predicted % 66       Lab Results  Component Value Date   NITRICOXIDE 56 08/30/2018        Assessment & Plan:   Asthma, chronic Severe persistent asthma with allergic phenotype.  Elevated eosinophil and elevated Feno in the past . Recent exacerbation requiring hospitalization-patient improved with treatment with empiric antibiotics and steroids.  Patient remains on budesonide and albuterol nebulizer twice daily.  Previously was on Trelegy but was unable to afford.  Continue on Singulair and Zyrtec.  Asthma action plan discussed in detail.  On return visit consider checking allergy panel with IgE.  And CBC with differential looking at eosinophil count-patient might be a good candidate for biologic therapy  Plan  Patient Instructions  Continue on Budesonide and Albuterol neb Twice daily   Singulair 10mg  At bedtime  Zyrtec 10mg  daily  Albuterol inhaler or neb As needed   Continue on Oxygen 3l/m  Continue on Lasix 40mg  daily  Continue with follow up with Cardiology and Nephrology  Continue on BIPAP ST machine At bedtime  and with naps.  Follow up with in 2 months with Dr. Sherene Sires  and As needed   Please contact office for sooner follow up if symptoms do not improve or worsen or seek emergency care    Chronic respiratory failure with hypoxia and hypercapnia (HCC) Hypoxic and hypercarbic respiratory failure with underlying  obstructive sleep apnea and OHS.  Patient is continue on oxygen to maintain O2 saturations greater than 88%.  Continue on BiPAP ST support at bedtime.  BiPAP download shows excellent compliance.  Continue on current settings  Plan  Patient Instructions  Continue on Budesonide and Albuterol neb Twice daily   Singulair 10mg  At bedtime  Zyrtec 10mg  daily  Albuterol inhaler or neb As needed   Continue on Oxygen 3l/m  Continue on Lasix 40mg  daily  Continue with follow up with Cardiology and Nephrology  Continue on BIPAP ST machine At bedtime  and with naps.  Follow up with in 2 months with Dr. Sherene Sires  and As needed   Please contact office for sooner follow up if symptoms do not improve or worsen or seek emergency care     Malignant neoplasm of lung [facility]) History of lung cancer follow-up CT chest May 24 showed no evidence of recurrent or metastatic disease  Stage 3b chronic kidney disease (HCC) Continue follow-up with nephrology  Chronic diastolic CHF (congestive heart failure) (HCC) Appears compensated on exam today.  Continue current regimen and follow-up with cardiology   I spent 41  minutes dedicated to the care of this patient on the date of this encounter to include pre-visit review of records, face-to-face time with the patient discussing conditions above, post visit ordering of testing, clinical documentation with the electronic health record, making appropriate referrals as documented, and communicating necessary findings to members of the patients care team.     Rubye Oaks, NP 05/04/2023

## 2023-05-04 NOTE — Patient Instructions (Addendum)
Continue on Budesonide and Albuterol neb Twice daily   Singulair 10mg  At bedtime  Zyrtec 10mg  daily  Albuterol inhaler or neb As needed   Continue on Oxygen 3l/m  Continue on Lasix 40mg  daily  Continue with follow up with Cardiology and Nephrology  Continue on BIPAP ST machine At bedtime  and with naps.  Follow up with in 2 months with Dr. Sherene Sires  and As needed   Please contact office for sooner follow up if symptoms do not improve or worsen or seek emergency care

## 2023-05-04 NOTE — Assessment & Plan Note (Signed)
Appears compensated on exam today.  Continue current regimen and follow-up with cardiology

## 2023-05-04 NOTE — Assessment & Plan Note (Addendum)
Severe persistent asthma with allergic phenotype.  Elevated eosinophil and elevated Feno in the past . Recent exacerbation requiring hospitalization-patient improved with treatment with empiric antibiotics and steroids.  Patient remains on budesonide and albuterol nebulizer twice daily.  Previously was on Trelegy but was unable to afford.  Continue on Singulair and Zyrtec.  Asthma action plan discussed in detail.  On return visit consider checking allergy panel with IgE.  And CBC with differential looking at eosinophil count-patient might be a good candidate for biologic therapy  Plan  Patient Instructions  Continue on Budesonide and Albuterol neb Twice daily   Singulair 10mg  At bedtime  Zyrtec 10mg  daily  Albuterol inhaler or neb As needed   Continue on Oxygen 3l/m  Continue on Lasix 40mg  daily  Continue with follow up with Cardiology and Nephrology  Continue on BIPAP ST machine At bedtime  and with naps.  Follow up with in 2 months with Dr. Sherene Sires  and As needed   Please contact office for sooner follow up if symptoms do not improve or worsen or seek emergency care

## 2023-05-06 ENCOUNTER — Ambulatory Visit: Payer: Self-pay

## 2023-05-06 ENCOUNTER — Other Ambulatory Visit: Payer: Self-pay | Admitting: Family Medicine

## 2023-05-06 DIAGNOSIS — M109 Gout, unspecified: Secondary | ICD-10-CM

## 2023-05-06 NOTE — Patient Outreach (Signed)
Care Coordination   Follow Up Visit Note   05/06/2023 Name: Jylian Weishuhn MRN: [id number] DOB: [birth year]  Pegah Suchodolski is a 72 y.o. year old female who sees Massie Maroon, FNP for primary care. I spoke with  [name] by phone today.  What matters to the patients health and wellness today?  Patient reports she is doing well. Office visit with pulmonology completed on 05/04/23. She denies any questions or concerns at this time.   Goals Addressed             This Visit's Progress    continue to improve post hospitalization       Interventions Today    Flowsheet Row Most Recent Value  Chronic Disease   Chronic disease during today's visit Congestive Heart Failure (CHF), Chronic Obstructive Pulmonary Disease (COPD), Other  [asthma, chronic respiratory failure]  General Interventions   General Interventions Discussed/Reviewed General Interventions Reviewed, Durable Medical Equipment (DME)  [Evaluation of current treatment plan for health condition and patient's adherence to plan.]  Durable Medical Equipment (DME) Oxygen, Other  [O2 at 3l/[place], Bipap hs and with nap, nebulizer machine]  Education Interventions   Education Provided Provided Education  Provided Verbal Education On When to see the doctor, Medication, Other  [reviewed patient instruction from pulmonary office visit 05/04/23. advised contact provider with health questions or concerns, attend provider visits as scheduled, take medications as prescribed]  Pharmacy Interventions   Pharmacy Dicussed/Reviewed Pharmacy Topics Reviewed            SDOH assessments and interventions completed:  No  Care Coordination Interventions:  Yes, provided   Follow up plan: Follow up call scheduled for 06/09/23    Encounter Outcome:  Patient Visit Completed   Kathyrn Sheriff, RN, MSN, BSN, CCM Care Management Coordinator [phone]

## 2023-05-06 NOTE — Telephone Encounter (Signed)
Please advise KH 

## 2023-05-06 NOTE — Patient Instructions (Signed)
Visit Information  Thank you for taking time to visit with me today. Please don't hesitate to contact me if I can be of assistance to you.   Following are the goals we discussed today:  Continue to take medications as prescribed. Continue to attend provider visits as scheduled Continue to eat healthy, lean meats, vegetables, fruits, avoid saturated and transfats Contact provider with health questions or concerns as needed  Our next appointment is by telephone on 06/09/23 at 2;00 pm  Please call the care guide team at [phone] if you need to cancel or reschedule your appointment.   If you are experiencing a Mental Health or Behavioral Health Crisis or need someone to talk to, please call the Suicide and Crisis Lifeline: 988 call the [place] National Suicide Prevention Lifeline: 315 188 8374 or TTY: (210) 451-9501 TTY (250) 392-7325) to talk to a trained counselor call 1-800-273-TALK (toll free, 24 hour hotline)  Kathyrn Sheriff, RN, MSN, BSN, CCM Care Management Coordinator [phone]

## 2023-05-09 DIAGNOSIS — I5032 Chronic diastolic (congestive) heart failure: Secondary | ICD-10-CM | POA: Diagnosis not present

## 2023-05-09 DIAGNOSIS — J441 Chronic obstructive pulmonary disease with (acute) exacerbation: Secondary | ICD-10-CM | POA: Diagnosis not present

## 2023-05-09 DIAGNOSIS — J9601 Acute respiratory failure with hypoxia: Secondary | ICD-10-CM | POA: Diagnosis not present

## 2023-05-11 NOTE — Telephone Encounter (Signed)
Done KH 

## 2023-05-13 DIAGNOSIS — N189 Chronic kidney disease, unspecified: Secondary | ICD-10-CM | POA: Diagnosis not present

## 2023-05-13 DIAGNOSIS — N2581 Secondary hyperparathyroidism of renal origin: Secondary | ICD-10-CM | POA: Diagnosis not present

## 2023-05-13 DIAGNOSIS — I5032 Chronic diastolic (congestive) heart failure: Secondary | ICD-10-CM | POA: Diagnosis not present

## 2023-05-13 DIAGNOSIS — I130 Hypertensive heart and chronic kidney disease with heart failure and stage 1 through stage 4 chronic kidney disease, or unspecified chronic kidney disease: Secondary | ICD-10-CM | POA: Diagnosis not present

## 2023-05-13 DIAGNOSIS — N1831 Chronic kidney disease, stage 3a: Secondary | ICD-10-CM | POA: Diagnosis not present

## 2023-05-13 DIAGNOSIS — J45909 Unspecified asthma, uncomplicated: Secondary | ICD-10-CM | POA: Diagnosis not present

## 2023-05-13 DIAGNOSIS — I503 Diastolic (congestive) heart failure: Secondary | ICD-10-CM | POA: Diagnosis not present

## 2023-05-13 DIAGNOSIS — N179 Acute kidney failure, unspecified: Secondary | ICD-10-CM | POA: Diagnosis not present

## 2023-05-13 DIAGNOSIS — M109 Gout, unspecified: Secondary | ICD-10-CM | POA: Diagnosis not present

## 2023-05-13 DIAGNOSIS — D631 Anemia in chronic kidney disease: Secondary | ICD-10-CM | POA: Diagnosis not present

## 2023-05-13 DIAGNOSIS — J9601 Acute respiratory failure with hypoxia: Secondary | ICD-10-CM | POA: Diagnosis not present

## 2023-05-14 LAB — LAB REPORT - SCANNED
Creatinine, POC: 113.6 mg/dL
EGFR: 35

## 2023-05-26 ENCOUNTER — Other Ambulatory Visit (HOSPITAL_COMMUNITY): Payer: Self-pay | Admitting: *Deleted

## 2023-05-27 ENCOUNTER — Encounter (HOSPITAL_COMMUNITY): Payer: Medicare PPO

## 2023-06-03 ENCOUNTER — Encounter (HOSPITAL_COMMUNITY): Payer: Medicare PPO

## 2023-06-03 ENCOUNTER — Other Ambulatory Visit: Payer: Self-pay | Admitting: Family Medicine

## 2023-06-03 ENCOUNTER — Encounter: Payer: Self-pay | Admitting: Family Medicine

## 2023-06-03 DIAGNOSIS — Z0000 Encounter for general adult medical examination without abnormal findings: Secondary | ICD-10-CM

## 2023-06-03 DIAGNOSIS — J449 Chronic obstructive pulmonary disease, unspecified: Secondary | ICD-10-CM | POA: Diagnosis not present

## 2023-06-03 DIAGNOSIS — J9610 Chronic respiratory failure, unspecified whether with hypoxia or hypercapnia: Secondary | ICD-10-CM | POA: Diagnosis not present

## 2023-06-03 DIAGNOSIS — J9612 Chronic respiratory failure with hypercapnia: Secondary | ICD-10-CM | POA: Diagnosis not present

## 2023-06-03 DIAGNOSIS — Z1231 Encounter for screening mammogram for malignant neoplasm of breast: Secondary | ICD-10-CM

## 2023-06-09 ENCOUNTER — Ambulatory Visit: Payer: Self-pay

## 2023-06-09 DIAGNOSIS — J441 Chronic obstructive pulmonary disease with (acute) exacerbation: Secondary | ICD-10-CM | POA: Diagnosis not present

## 2023-06-09 DIAGNOSIS — I5032 Chronic diastolic (congestive) heart failure: Secondary | ICD-10-CM | POA: Diagnosis not present

## 2023-06-09 DIAGNOSIS — J9601 Acute respiratory failure with hypoxia: Secondary | ICD-10-CM | POA: Diagnosis not present

## 2023-06-09 NOTE — Patient Outreach (Signed)
  Care Coordination   Follow Up Visit Note   06/09/2023 Name: Pamela [name] MRN: [id number] DOB: 02-15-1951  Pamela [name] is a 72 y.o. year old female who sees Pamela Maroon, FNP for primary care. I spoke with  Pamela [name] by phone today.  What matters to the patients health and wellness today?  Pamela [name] reports she is doing well. She denies any questions or concerns at this time.  Goals Addressed             This Visit's Progress    COMPLETED: continue to improve post hospitalization       Interventions Today    Flowsheet Row Most Recent Value  Chronic Disease   Chronic disease during today's visit Congestive Heart Failure (CHF), Chronic Obstructive Pulmonary Disease (COPD), Other  [asthma, chronic respiratory failure]  General Interventions   General Interventions Discussed/Reviewed General Interventions Reviewed, Durable Medical Equipment (DME)  [Evaluation of current treatment plan for health condition and patient's adherence to plan.]  Durable Medical Equipment (DME) Oxygen, Other  [O2 at 3l/Pamela [name], Bipap hs and with nap, nebulizer machine]  Education Interventions   Education Provided Provided Education  Provided Verbal Education On When to see the doctor, Medication, Other  [reviewed patient instruction from pulmonary office visit 05/04/23. advised contact provider with health questions or concerns, attend provider visits as scheduled, take medications as prescribed]  Pharmacy Interventions   Pharmacy Dicussed/Reviewed Pharmacy Topics Reviewed           Health management       Interventions Today    Flowsheet Row Most Recent Value  Chronic Disease   Chronic disease during today's visit Congestive Heart Failure (CHF)  General Interventions   General Interventions Discussed/Reviewed General Interventions Reviewed, Doctor Visits  [Evaluation of current treatment plan for health condition and patient's adherence to plan.]  Doctor Visits Discussed/Reviewed Doctor  Visits Reviewed  Pamela [name] upcoming/scheduled appointments]  Education Interventions   Education Provided Provided Education  Provided Verbal Education On Nutrition, When to see the doctor, Medication  [advised to continue to take medications as prescribed, attend provider visits as scheduled, eat healthy and monitor intake during holiday season. discussed message in mychart notification of PCP leaving practice]  Nutrition Interventions   Nutrition Discussed/Reviewed Nutrition Discussed  Pharmacy Interventions   Pharmacy Dicussed/Reviewed Pharmacy Topics Reviewed            SDOH assessments and interventions completed:  No  Care Coordination Interventions:  Yes, provided   Follow up plan: Follow up call scheduled for 07/08/23    Encounter Outcome:  Patient Visit Completed   Pamela Sheriff, RN, MSN, BSN, CCM Care Management Coordinator [phone]

## 2023-06-09 NOTE — Patient Instructions (Signed)
Visit Information  Thank you for taking time to visit with me today. Please don't hesitate to contact me if I can be of assistance to you.   Following are the goals we discussed today:  Continue to take medications as prescribed. Continue to attend provider visits as scheduled Continue to eat healthy, lean meats, vegetables, fruits, avoid saturated and transfats Contact provider with health questions or concerns as needed  Our next appointment is by telephone on 07/08/23 at 11:30 am  Please call the care guide team at [phone] if you need to cancel or reschedule your appointment.   If you are experiencing a Mental Health or Behavioral Health Crisis or need someone to talk to, please call the Suicide and Crisis Lifeline: 988 call the [place] National Suicide Prevention Lifeline: (681)762-7366 or TTY: 939-269-5732 TTY 4128537250) to talk to a trained counselor call 1-800-273-TALK (toll free, 24 hour hotline)   Kathyrn Sheriff, RN, MSN, BSN, CCM Care Management Coordinator [phone]

## 2023-06-13 DIAGNOSIS — J9601 Acute respiratory failure with hypoxia: Secondary | ICD-10-CM | POA: Diagnosis not present

## 2023-06-13 DIAGNOSIS — J45909 Unspecified asthma, uncomplicated: Secondary | ICD-10-CM | POA: Diagnosis not present

## 2023-06-13 DIAGNOSIS — I5032 Chronic diastolic (congestive) heart failure: Secondary | ICD-10-CM | POA: Diagnosis not present

## 2023-06-14 ENCOUNTER — Ambulatory Visit (INDEPENDENT_AMBULATORY_CARE_PROVIDER_SITE_OTHER): Payer: Medicare PPO | Admitting: Podiatry

## 2023-06-14 ENCOUNTER — Encounter: Payer: Self-pay | Admitting: Podiatry

## 2023-06-14 DIAGNOSIS — M79675 Pain in left toe(s): Secondary | ICD-10-CM

## 2023-06-14 DIAGNOSIS — M79674 Pain in right toe(s): Secondary | ICD-10-CM | POA: Diagnosis not present

## 2023-06-14 DIAGNOSIS — Q828 Other specified congenital malformations of skin: Secondary | ICD-10-CM | POA: Diagnosis not present

## 2023-06-14 DIAGNOSIS — B351 Tinea unguium: Secondary | ICD-10-CM

## 2023-06-14 NOTE — Progress Notes (Signed)
Subjective: Chief Complaint  Patient presents with   Debridement    Trim toenails/calluses    72 y.o. returns the office today for painful, elongated, thickened toenails which she cannot trim herself.  She has pain in the nails but no other areas of discomfort.  No open lesions that she reports.  Massie Maroon, FNP   Objective: AAO 3, NAD DP/PT pulses palpable, CRT less than 3 seconds Chronic lower extremity edema. Nails hypertrophic, dystrophic, elongated, brittle, discolored 10. There is tenderness overlying the nails 1-5 bilaterally. There is no surrounding erythema or drainage along the nail sites. Hyperkeratotic lesions noted along the lateral aspect of the right heel without any underlying ulceration, drainage or any signs of infection. No pain with calf compression, swelling, warmth, erythema.  Assessment: Patient presents with symptomatic onychomycosis, hyperkeratotic lesion  Plan: -Treatment options including alternatives, risks, complications were discussed -Nails sharply debrided 10 without complication/bleeding. -Debrided hyperkeratotic lesion x 1 without any complications or bleeding. -Daily foot inspection discussed. -Follow-up in 3 months or sooner if any problems are to arise. In the meantime, encouraged to call the office with any questions, concerns, changes symptoms.  Return in about 3 months (around 09/14/2023).  Vivi Barrack DPM

## 2023-06-21 ENCOUNTER — Ambulatory Visit: Payer: Self-pay

## 2023-06-21 NOTE — Patient Instructions (Addendum)
Visit Information  Thank you for taking time to visit with me today. Please don't hesitate to contact me if I can be of assistance to you.   Following are the goals we discussed today:  Continue to take medications as prescribed. Continue to attend provider visits as scheduled Continue to eat healthy, lean meats, vegetables, fruits, avoid saturated and transfats Continue to monitor for signs/symptoms of HF exacerbation and COPD flare up Contact provider with health questions or concerns as needed  If you are experiencing a Mental Health or Behavioral Health Crisis or need someone to talk to, please call the Suicide and Crisis Lifeline: 988 call the [place] National Suicide Prevention Lifeline: 782-287-3626 or TTY: (613)183-6198 TTY (507) 685-9859) to talk to a trained counselor  Kathyrn Sheriff, RN, MSN, BSN, CCM Care Management Coordinator [phone]

## 2023-06-21 NOTE — Patient Outreach (Signed)
  Care Coordination   Follow Up Visit Note   06/21/2023 Name: Pamela [name] MRN: [id number] DOB: 26-Dec-1950  Pamela [name] is a 72 y.o. year old female who sees Massie Maroon, FNP for primary care. I spoke with  Pamela [name] by phone today.  What matters to the patients health and wellness today?  Pamela [name] reports she is doing well. She denies any problems at this time. She states she continues to weigh self daily. She denies any signs/symptoms of HF exacerbation or COPD exacerbation. Continues to wear oxygen as prescribed and states she has a pulse oximeter to keep check on Oxygen saturation. She denies any care management needs at this time and states she will contact RNCM if care management needs in the future.  Goals Addressed             This Visit's Progress    COMPLETED: Health management       Interventions Today    Flowsheet Row Most Recent Value  Chronic Disease   Chronic disease during today's visit Chronic Obstructive Pulmonary Disease (COPD), Congestive Heart Failure (CHF)  General Interventions   General Interventions Discussed/Reviewed General Interventions Reviewed  [Evaluation of current treatment plan for health condition and patient's adherence to plan.]  Doctor Visits Discussed/Reviewed Doctor Visits Reviewed, PCP  PCP/Specialist Visits Compliance with follow-up visit  [reviewed upcoming follow up appointments]  Education Interventions   Education Provided Provided Education  [reviewed signs/symptoms of HF/COPD exacerbation and actions, encouraged to stay away from people with respiratory illnesses.]  Provided Verbal Education On When to see the doctor, Medication, Nutrition  [advised to continue to eat healthy, monitor salt/fluid intake, take medications as prescribed, contact provider with health questions/concerns]  Nutrition Interventions   Nutrition Discussed/Reviewed Nutrition Reviewed  Pharmacy Interventions   Pharmacy Dicussed/Reviewed Pharmacy  Topics Reviewed            SDOH assessments and interventions completed:  Yes  Care Coordination Interventions:  Yes, provided   Follow up plan: No further intervention required.   Encounter Outcome:  Patient Visit Completed   Kathyrn Sheriff, RN, MSN, BSN, CCM Care Management Coordinator [phone]

## 2023-06-29 ENCOUNTER — Ambulatory Visit: Payer: Self-pay | Admitting: Family Medicine

## 2023-06-30 ENCOUNTER — Other Ambulatory Visit: Payer: Medicare PPO

## 2023-06-30 DIAGNOSIS — I700 Atherosclerosis of aorta: Secondary | ICD-10-CM

## 2023-07-01 LAB — LIPID PANEL
Chol/HDL Ratio: 2.2 {ratio} (ref 0.0–4.4)
Cholesterol, Total: 113 mg/dL (ref 100–199)
HDL: 52 mg/dL (ref >39)
LDL Chol Calc (NIH): 46 mg/dL (ref 0–99)
Triglycerides: 70 mg/dL (ref 0–149)
VLDL Cholesterol Cal: 15 mg/dL (ref 5–40)

## 2023-07-01 LAB — ALT: ALT: 12 [IU]/L (ref 0–32)

## 2023-07-03 DIAGNOSIS — J449 Chronic obstructive pulmonary disease, unspecified: Secondary | ICD-10-CM | POA: Diagnosis not present

## 2023-07-03 DIAGNOSIS — J9610 Chronic respiratory failure, unspecified whether with hypoxia or hypercapnia: Secondary | ICD-10-CM | POA: Diagnosis not present

## 2023-07-03 DIAGNOSIS — J9612 Chronic respiratory failure with hypercapnia: Secondary | ICD-10-CM | POA: Diagnosis not present

## 2023-07-06 ENCOUNTER — Other Ambulatory Visit: Payer: Self-pay | Admitting: Internal Medicine

## 2023-07-06 ENCOUNTER — Other Ambulatory Visit: Payer: Self-pay | Admitting: Family Medicine

## 2023-07-06 DIAGNOSIS — I5030 Unspecified diastolic (congestive) heart failure: Secondary | ICD-10-CM

## 2023-07-09 DIAGNOSIS — I5032 Chronic diastolic (congestive) heart failure: Secondary | ICD-10-CM | POA: Diagnosis not present

## 2023-07-09 DIAGNOSIS — J9601 Acute respiratory failure with hypoxia: Secondary | ICD-10-CM | POA: Diagnosis not present

## 2023-07-09 DIAGNOSIS — J441 Chronic obstructive pulmonary disease with (acute) exacerbation: Secondary | ICD-10-CM | POA: Diagnosis not present

## 2023-07-12 ENCOUNTER — Ambulatory Visit
Admission: RE | Admit: 2023-07-12 | Discharge: 2023-07-12 | Disposition: A | Discharge status: Home / Self Care | Payer: Medicare PPO | Source: Ambulatory Visit | Attending: Family Medicine | Admitting: Family Medicine

## 2023-07-12 DIAGNOSIS — Z1231 Encounter for screening mammogram for malignant neoplasm of breast: Secondary | ICD-10-CM

## 2023-07-13 DIAGNOSIS — J45909 Unspecified asthma, uncomplicated: Secondary | ICD-10-CM | POA: Diagnosis not present

## 2023-07-13 DIAGNOSIS — I5032 Chronic diastolic (congestive) heart failure: Secondary | ICD-10-CM | POA: Diagnosis not present

## 2023-07-13 DIAGNOSIS — J9601 Acute respiratory failure with hypoxia: Secondary | ICD-10-CM | POA: Diagnosis not present

## 2023-07-22 ENCOUNTER — Ambulatory Visit: Payer: Medicare PPO

## 2023-07-22 VITALS — BP 106/46 | HR 64 | Temp 97.0°F | Ht 64.0 in | Wt 315.4 lb

## 2023-07-22 DIAGNOSIS — Z0000 Encounter for general adult medical examination without abnormal findings: Secondary | ICD-10-CM

## 2023-07-22 DIAGNOSIS — Z23 Encounter for immunization: Secondary | ICD-10-CM | POA: Diagnosis not present

## 2023-07-22 NOTE — Progress Notes (Signed)
 Subjective:   Pamela [name] is a 73 y.o. female who presents for Medicare Annual (Subsequent) preventive examination.  Visit Complete: In person  Patient Medicare AWV questionnaire was completed by the patient on 07/22/23; I have confirmed that all information answered by patient is correct and no changes since this date.  Cardiac Risk Factors include: advanced age (>14men, >38 women);obesity (BMI >30kg/m2)     Objective:    Today's Vitals   07/22/23 0940 07/22/23 1003  BP: (!) 106/46   Pulse: 64   Temp: (!) 97 F (36.1 C)   SpO2: 100%   Weight: (!) 315 lb 6.4 oz (143.1 kg)   Height: 5' 4 (1.626 m)   PainSc:  3    Body mass index is 54.14 kg/m.     07/22/2023   10:16 AM 02/24/2023    9:44 AM 02/23/2023   12:37 PM 08/16/2022   12:30 PM 07/15/2022    9:12 AM 04/04/2022    6:00 AM 04/03/2022    4:49 PM  Advanced Directives  Does Patient Have a Medical Advance Directive? Yes  No No Yes Yes Yes  Type of Advance Directive Living will;Healthcare Power of [place] Power of [place];Living will;Out of facility DNR (pink MOST or yellow form) Healthcare Power of [place] of [place];Living will  Does patient want to make changes to medical advance directive? No - Patient declined    No - Patient declined No - Patient declined   Copy of Healthcare Power of Attorney in Chart? Yes - validated most recent copy scanned in chart (See row information)    Yes - validated most recent copy scanned in chart (See row information) No - copy requested   Would patient like information on creating a medical advance directive?  No - Patient declined  No - Patient declined       Current Medications (verified) Outpatient Encounter Medications as of 07/22/2023  Medication Sig   acetaminophen  (TYLENOL ) 500 MG tablet Take 1,000 mg by mouth every 6 (six) hours as needed for mild pain.   albuterol  (PROAIR  HFA) 108 (90 Base) MCG/ACT inhaler Inhale 2 puffs into the lungs every 4  (four) hours as needed for wheezing or shortness of breath.   albuterol  (PROVENTIL ) (2.5 MG/3ML) 0.083% nebulizer solution USE 1 VIAL IN NEBULIZER EVERY 6 HOURS AS NEEDED FOR WHEEZING FOR SHORTNESS OF BREATH   aspirin  EC 81 MG tablet Take 1 tablet (81 mg total) by mouth daily. Swallow whole.   atorvastatin  (LIPITOR) 40 MG tablet Take 1 tablet (40 mg total) by mouth daily.   B Complex-C (B-COMPLEX WITH VITAMIN C) tablet Take 1 tablet by mouth daily.   budesonide  (PULMICORT ) 0.25 MG/2ML nebulizer solution One twice daily with albuterol    calcitRIOL  (ROCALTROL ) 0.25 MCG capsule Take 1 capsule (0.25 mcg total) by mouth daily.   cetirizine  (ZYRTEC ) 10 MG chewable tablet Chew 10 mg by mouth daily.   diclofenac  Sodium (VOLTAREN ) 1 % GEL Apply 2 g topically 4 (four) times daily.   docusate sodium  (STOOL SOFTENER) 100 MG capsule Take 100 mg by mouth daily.   ezetimibe  (ZETIA ) 10 MG tablet Take 1 tablet (10 mg total) by mouth daily.   famotidine  (PEPCID ) 20 MG tablet TAKE 1 TABLET AFTER SUPPER   febuxostat  (ULORIC ) 40 MG tablet TAKE 1 TABLET EVERY DAY   ferrous sulfate  325 (65 FE) MG tablet Take 1 tablet (325 mg total) by mouth daily with lunch.   folic acid  (FOLVITE ) 1 MG  tablet Take 1 tablet (1 mg total) by mouth daily.   furosemide  (LASIX ) 40 MG tablet TAKE 1 TABLET EVERY DAY   hydrOXYzine  (ATARAX ) 25 MG tablet Take 1 tablet (25 mg total) by mouth 3 (three) times daily as needed for itching.   Incontinence Supply Disposable (DEPEND UNDERWEAR LARGE/XL) MISC 1 each by Does not apply route 2 times daily at 12 noon and 4 pm.   montelukast  (SINGULAIR ) 10 MG tablet Take 1 tablet (10 mg total) by mouth at bedtime.   pantoprazole  (PROTONIX ) 40 MG tablet TAKE 1 TABLET EVERY DAY 30 TO 60 MINUTES BEFORE FIRST MEAL OF THE DAY (Patient taking differently: Take 40 mg by mouth See admin instructions. TAKE 1 TABLET EVERY DAY 30 TO 60 MINUTES BEFORE FIRST MEAL OF THE DAY)   dextromethorphan  (DELSYM ) 30 MG/5ML liquid  Take 5 mLs (30 mg total) by mouth 2 (two) times daily. (Patient not taking: Reported on 07/22/2023)   guaiFENesin  (MUCINEX ) 600 MG 12 hr tablet Take 1 tablet (600 mg total) by mouth 2 (two) times daily. (Patient not taking: Reported on 07/22/2023)   predniSONE  (DELTASONE ) 10 MG tablet Take 40mg  daily for 3days,Take 30mg  daily for 3days,Take 20mg  daily for 3days,Take 10mg  daily for 3days, then stop (Patient not taking: Reported on 07/22/2023)   No facility-administered encounter medications on file as of 07/22/2023.    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 Patient notes it was consistent with sarcoidosis but actual pathology results not available.   Oxygen  deficiency    Sarcoidosis    Sickle cell trait (HCC)    Sleep apnea    Past Surgical History:  Procedure Laterality Date   LOOP RECORDER INSERTION N/A 09/08/2018   Procedure: LOOP RECORDER INSERTION;  Surgeon: Waddell Danelle ORN, MD;  Location: [facility]ASIVE [facility];  Service: Cardiovascular;  Laterality: N/A;   LUNG BIOPSY     TEE WITHOUT CARDIOVERSION N/A 09/08/2018   Procedure: TRANSESOPHAGEAL ECHOCARDIOGRAM (TEE);  Surgeon: Delford Maude BROCKS, MD;  Location: [facility] ENDOSCOPY;  Service: Cardiovascular;  Laterality: N/A;  with loop   TUBAL LIGATION     VEIN LIGATION AND STRIPPING     Family History  Problem Relation Age of Onset   Hypertension Mother    Renal Disease Mother    High  blood pressure Mother    Heart disease Mother    Cancer Father        stomach   Heart disease Father        No details   Cervical cancer Sister    Diabetes Sister    Multiple myeloma Sister    Cancer Brother    Diabetes Brother    Social History   Socioeconomic History   Marital status: Divorced    Spouse name: Not on file   Number of children: 2   Years of  education: Not on file   Highest education level: Not on file  Occupational History   Not on file  Tobacco Use   Smoking status: Never   Smokeless tobacco: Never  Vaping Use   Vaping status: Never Used  Substance and Sexual Activity   Alcohol use: Not on file   Drug use: No   Sexual activity: Not Currently  Other Topics Concern   Not on file  Social History Narrative   Lives with son.  One son is deceased.     Social Drivers of Corporate Investment Banker Strain: Low Risk  (07/22/2023)   Overall Financial Resource Strain (CARDIA)    Difficulty of Paying Living Expenses: Not hard at all  Food Insecurity: No Food Insecurity (07/22/2023)   Hunger Vital Sign    Worried About Running Out of Food in the Last Year: Never true    Ran Out of Food in the Last Year: Never true  Transportation Needs: No Transportation Needs (07/22/2023)   PRAPARE - Administrator, Civil Service (Medical): No    Lack of Transportation (Non-Medical): No  Physical Activity: Insufficiently Active (07/22/2023)   Exercise Vital Sign    Days of Exercise per Week: 7 days    Minutes of Exercise per Session: 20 min  Stress: No Stress Concern Present (07/15/2022)   [employer] of Occupational Health - Occupational Stress Questionnaire    Feeling of Stress : Not at all  Social Connections: Moderately Integrated (07/22/2023)   Social Connection and Isolation Panel [NHANES]    Frequency of Communication with Friends and Family: More than three times a week    Frequency of Social Gatherings with Friends and Family: More than three times a  week    Attends Religious Services: More than 4 times per year    Active Member of [employer] or Organizations: Yes    Attends Engineer, Structural: More than 4 times per year    Marital Status: Divorced    Tobacco Counseling Counseling given: Not Answered   Clinical Intake:  Pre-visit preparation completed: Yes  Pain : 0-10 Pain Score: 3  Pain Type: Acute pain Pain Location: Knee Pain Orientation: Left, Right Pain Radiating Towards: N/A Pain Descriptors / Indicators: Aching Pain Onset: Today Pain Frequency: Constant Pain Relieving Factors: Taking Tylenol   Pain Relieving Factors: Taking Tylenol   BMI - recorded: 54.14 Nutritional Status: BMI > 30  Obese Nutritional Risks: None Diabetes: No  How often do you need to have someone help you when you read instructions, pamphlets, or other written materials from your doctor or pharmacy?: 1 - Never  Interpreter Needed?: No      Activities of Daily Living    07/22/2023   10:06 AM 02/24/2023    9:00 AM  In your present state of health, do you have any difficulty performing the following activities:  Hearing? 0 0  Vision? 0 0  Difficulty concentrating or making decisions? 0 0  Walking or climbing stairs? 1 1  Comment working on this with P/T   Dressing or bathing? 0 0  Doing errands, shopping? 0 1  Preparing Food and eating ? N   Using the Toilet? N   In the past six months, have you accidently leaked urine? N   Do you have problems with loss of bowel control? N   Managing your Medications? N   Managing your Finances? N   Housekeeping or managing your Housekeeping? N     Patient Care Team:  Paseda, [name], FNP as PCP - General (Nurse Practitioner) Santo Stanly LABOR, MD as PCP - Cardiology (Cardiology)  Indicate any recent Medical Services you may have received from other than [facility] providers in the past year (date may be approximate).     Assessment:   This is a routine wellness examination for  Pamela [name].  Hearing/Vision screen No results found.   Goals Addressed             This Visit's Progress    LIFESTYLE - DECREASE FALLS RISK   On track    Did P/T       Depression Screen    07/22/2023   10:18 AM 07/22/2023   10:14 AM 03/30/2023   10:26 AM 01/26/2023    9:35 AM 10/27/2022   10:13 AM 09/08/2022   12:37 PM 07/28/2022   11:14 AM  PHQ 2/9 Scores  PHQ - 2 Score 0 0 0 0 0 0 0  PHQ- 9 Score     0      Fall Risk    07/22/2023   10:17 AM 10/27/2022   10:13 AM 07/15/2022    9:13 AM 04/21/2022   10:34 AM 12/29/2021    1:32 PM  Fall Risk   Falls in the past year? 0 0 1 0 0  Number falls in past yr: 0 0 0 0 0  Injury with Fall? 0 0 0 0 0  Risk for fall due to : No Fall Risks No Fall Risks History of fall(s) No Fall Risks   Follow up Falls evaluation completed Falls evaluation completed Falls evaluation completed Falls evaluation completed     MEDICARE RISK AT HOME: Medicare Risk at Home Any stairs in or around the home?: Yes If so, are there any without handrails?: Yes Home free of loose throw rugs in walkways, pet beds, electrical cords, etc?: Yes Adequate lighting in your home to reduce risk of falls?: Yes Life alert?: No Use of a cane, walker or w/c?: Yes Grab bars in the bathroom?: Yes Shower chair or bench in shower?: Yes Elevated toilet seat or a handicapped toilet?: Yes  TIMED UP AND GO:  Was the test performed?  No    Cognitive Function:    06/15/2018   11:56 AM  MMSE - Mini Mental State Exam  Orientation to time 5  Orientation to Place 5  Registration 3  Attention/ Calculation 5  Recall 3  Language- name 2 objects 2  Language- repeat 1  Language- follow 3 step command 3  Language- read & follow direction 1  Write a sentence 1  Copy design 1  Total score 30        07/22/2023   10:19 AM 07/15/2022    9:14 AM  6CIT Screen  What Year? 0 points 0 points  What month? 0 points 0 points  What time? 0 points 0 points  Count back from 20 0 points 0  points  Months in reverse 0 points 0 points  Repeat phrase 0 points 0 points  Total Score 0 points 0 points    Immunizations Immunization History  Administered Date(s) Administered   Influenza,inj,Quad PF,6+ Mos 04/13/2014, 04/16/2015, 04/03/2016, 05/19/2017, 06/15/2018, 05/14/2019, 04/21/2022   PFIZER(Purple Top)SARS-COV-2 Vaccination 10/14/2019, 11/08/2019, 07/08/2020, 03/06/2021   Pneumococcal Conjugate-13 04/03/2016   Pneumococcal Polysaccharide-23 04/13/2014, 12/26/2019   Tdap 01/02/2015   Zoster, Live 05/21/2014    TDAP status: Up to date  Flu Vaccine status: Up to date  Pneumococcal vaccine status: Up to date  Covid-19 vaccine status: Declined, Education has been provided regarding the importance of this vaccine but patient still declined. Advised may receive this vaccine at local pharmacy or Health Dept.or vaccine clinic. Aware to provide a copy of the vaccination record if obtained from local pharmacy or Health Dept. Verbalized acceptance and understanding.  Qualifies for Shingles Vaccine? No   Zostavax completed Yes   Shingrix Completed?: Yes  Screening Tests Health Maintenance  Topic Date Due   Zoster Vaccines- Shingrix (1 of 2) 11/07/1971   COVID-19 Vaccine (5 - 2024-25 season) 03/21/2023   INFLUENZA VACCINE  10/18/2023 (Originally 02/18/2023)   Medicare Annual Wellness (AWV)  07/21/2024   Fecal DNA (Cologuard)  10/08/2024   DTaP/Tdap/Td (2 - Td or Tdap) 01/01/2025   MAMMOGRAM  07/11/2025   Pneumonia Vaccine 52+ Years old  Completed   DEXA SCAN  Completed   Hepatitis C Screening  Completed   HPV VACCINES  Aged Out    Health Maintenance  Health Maintenance Due  Topic Date Due   Zoster Vaccines- Shingrix (1 of 2) 11/07/1971   COVID-19 Vaccine (5 - 2024-25 season) 03/21/2023    Colorectal cancer screening: No longer required.   Mammogram status: Completed 07/02/23. Repeat every year  Bone Density status: Completed 01/06/23. Results reflect: Bone density  results: NORMAL. Repeat every two years.  Lung Cancer Screening: (Low Dose CT Chest recommended if Age 81-80 years, 20 pack-year currently smoking OR have quit w/in 15years.) does not qualify.   Lung Cancer Screening Referral: N/A  Additional Screening:  Hepatitis C Screening: does not qualify; Completed 04/03/2016  Vision Screening: Recommended annual ophthalmology exams for early detection of glaucoma and other disorders of the eye. Is the patient up to date with their annual eye exam?  Yes  Who is the provider or what is the name of the office in which the patient attends annual eye exams? [facility] If pt is not established with a provider, would they like to be referred to a provider to establish care? No .   Dental Screening: Recommended annual dental exams for proper oral hygiene    Community Resource Referral / Chronic Care Management: CRR required this visit?  No   CCM required this visit?  No     Plan:     I have personally reviewed and noted the following in the patient's chart:   Medical and social history Use of alcohol, tobacco or illicit drugs  Current medications and supplements including opioid prescriptions. Patient is not currently taking opioid prescriptions. Functional ability and status Nutritional status Physical activity Advanced directives List of other physicians Hospitalizations, surgeries, and ER visits in previous 12 months Vitals Screenings to include cognitive, depression, and falls Referrals and appointments  In addition, I have reviewed and discussed with patient certain preventive protocols, quality metrics, and best practice recommendations. A written personalized care plan for preventive services as well as general preventive health recommendations were provided to patient.     Suzen Shove, RMA   07/22/2023   After Visit Summary: (In Person-Declined) Patient declined AVS at this time.  Nurse Notes: N/A

## 2023-07-27 ENCOUNTER — Ambulatory Visit: Payer: Self-pay | Admitting: Family Medicine

## 2023-07-28 ENCOUNTER — Encounter: Payer: Self-pay | Admitting: Nurse Practitioner

## 2023-07-28 ENCOUNTER — Ambulatory Visit (INDEPENDENT_AMBULATORY_CARE_PROVIDER_SITE_OTHER): Payer: Medicare HMO | Admitting: Nurse Practitioner

## 2023-07-28 VITALS — BP 95/56 | HR 76 | Temp 97.0°F | Wt 312.0 lb

## 2023-07-28 DIAGNOSIS — J9612 Chronic respiratory failure with hypercapnia: Secondary | ICD-10-CM

## 2023-07-28 DIAGNOSIS — I5032 Chronic diastolic (congestive) heart failure: Secondary | ICD-10-CM | POA: Diagnosis not present

## 2023-07-28 DIAGNOSIS — D638 Anemia in other chronic diseases classified elsewhere: Secondary | ICD-10-CM

## 2023-07-28 DIAGNOSIS — E785 Hyperlipidemia, unspecified: Secondary | ICD-10-CM

## 2023-07-28 DIAGNOSIS — N1832 Chronic kidney disease, stage 3b: Secondary | ICD-10-CM

## 2023-07-28 DIAGNOSIS — Z85118 Personal history of other malignant neoplasm of bronchus and lung: Secondary | ICD-10-CM

## 2023-07-28 DIAGNOSIS — Z8673 Personal history of transient ischemic attack (TIA), and cerebral infarction without residual deficits: Secondary | ICD-10-CM | POA: Diagnosis not present

## 2023-07-28 DIAGNOSIS — R600 Localized edema: Secondary | ICD-10-CM

## 2023-07-28 DIAGNOSIS — J9611 Chronic respiratory failure with hypoxia: Secondary | ICD-10-CM

## 2023-07-28 DIAGNOSIS — Z23 Encounter for immunization: Secondary | ICD-10-CM

## 2023-07-28 DIAGNOSIS — R32 Unspecified urinary incontinence: Secondary | ICD-10-CM

## 2023-07-28 DIAGNOSIS — N62 Hypertrophy of breast: Secondary | ICD-10-CM

## 2023-07-28 NOTE — Assessment & Plan Note (Signed)
 LDL is currently well-controlled on atorvastatin 40 mg daily, Zetia 10 mg daily Continue current medications

## 2023-07-28 NOTE — Assessment & Plan Note (Signed)
 She is looking at doing breast reduction surgery much later in the year

## 2023-07-28 NOTE — Assessment & Plan Note (Signed)
 Lab Results  Component Value Date   NA 140 03/24/2023   K 4.1 03/24/2023   CO2 27 03/24/2023   GLUCOSE 84 03/24/2023   BUN 23 03/24/2023   CREATININE 1.38 (H) 03/24/2023   CALCIUM  8.4 (L) 03/24/2023   GFR 29.92 (L) 08/27/2022   EGFR 35.0 05/14/2023   GFRNONAA 45 (L) 03/03/2023  Avoid NSAIDs and other nephrotoxic agents Encouraged to maintain close follow-up with nephrologist

## 2023-07-28 NOTE — Assessment & Plan Note (Signed)
 Uses depends as needed.

## 2023-07-28 NOTE — Assessment & Plan Note (Signed)
 DASH diet advised Patient encouraged to keep legs elevated when sitting and wear compression socks Continue furosemide 40 mg daily

## 2023-07-28 NOTE — Assessment & Plan Note (Addendum)
 Continue furosemide 40 mg daily Nonpitting edema noted on bilateral lower extremities, use of compression socks and elevation discussed

## 2023-07-28 NOTE — Assessment & Plan Note (Signed)
 Continue 3 L of oxygen  via nasal cannula Continue BiPAP at bedtime, singular 10 mg at bedtime, Zyrtec  10 mg daily, albuterol  inhaler 2 puffs every 4 hours as needed, Pulmicort  0.25 mg nebulizer solution twice daily Encouraged to maintain close follow-up with pulmonology

## 2023-07-28 NOTE — Patient Instructions (Signed)

## 2023-07-28 NOTE — Assessment & Plan Note (Signed)
 Wt Readings from Last 3 Encounters:  07/28/23 (!) 312 lb (141.5 kg)  07/22/23 (!) 315 lb 6.4 oz (143.1 kg)  05/04/23 (!) 320 lb (145.2 kg)   Body mass index is 53.55 kg/m.  Patient counseled on low-carb diet Encouraged to engage and regular moderate exercises as tolerated

## 2023-07-28 NOTE — Assessment & Plan Note (Signed)
 Currently in remission Sees oncologist yearly

## 2023-07-28 NOTE — Progress Notes (Signed)
 New Patient Office Visit  Subjective:  Patient ID: Pamela [name], female    DOB: 11/27/1950  Age: 73 y.o. MRN: [id number]  CC:  Chief Complaint  Patient presents with   Diabetes   Obesity    HPI Pamela [name] is a 73 y.o. female  has a past medical history of Arthritis, Asthma, Cancer (HCC), CHF (congestive heart failure) (HCC), Congenital single kidney, COPD (chronic obstructive pulmonary disease) (HCC), Gout, Hypertension, Lung cancer (HCC) (2012), Mass of chest wall, right, Oxygen  deficiency, Sarcoidosis, Sickle cell trait (HCC), and Sleep apnea.  Patient presents to establish care with new provider for her chronic medical conditions.  Previous PCP like China Hollis NP.  Chronic respiratory failure/COPD/asthma on continuous 3 L oxygen  via nasal cannula, also uses BiPAP at night.  She denies increased shortness of breath, wheezing, cough.  She has been following up regularly with a pulmonologist.  Stated that she had lung cancer and has completed treatments she sees oncologist once a year.  Obesity.  Stated that she has been doing some chair exercises at home and also walking down the stairs.  She has been to a weight management clinic in the past.  Recently completed physical therapy.  She is looking at doing breast reduction surgery by the end of this year, states that her breasts are large and the make her shoulders hurt  CHF.  Followed by cardiology.  On furosemide  40 mg daily.  She denies chest pain, increase shortness of breath, reports bilateral lower extremity edema.  CKD.  She is followed by nephrologist on calcitriol  0.25 mg daily,    She is up-to-date with her vaccines will get records from the pharmacy    Wt Readings from Last 3 Encounters:  07/28/23 (!) 312 lb (141.5 kg)  07/22/23 (!) 315 lb 6.4 oz (143.1 kg)  05/04/23 (!) 320 lb (145.2 kg)      Past Medical History:  Diagnosis Date   Arthritis    Asthma    Cancer (HCC)    lung, adenocarcinoma right lung  2012   CHF (congestive heart failure) (HCC)    Preserved EF   Congenital single kidney    With chronic kidney disease   COPD (chronic obstructive pulmonary disease) (HCC)    Gout    Hypertension    Lung cancer (HCC) 2012   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 Patient notes it was consistent with sarcoidosis but actual pathology results not available.   Oxygen  deficiency    Sarcoidosis    Sickle cell trait (HCC)    Sleep apnea     Past Surgical History:  Procedure Laterality Date   LOOP RECORDER INSERTION N/A 09/08/2018   Procedure: LOOP RECORDER INSERTION;  Surgeon: Waddell Danelle ORN, MD;  Location: [facility];  Service: Cardiovascular;  Laterality: N/A;   LUNG BIOPSY     TEE WITHOUT CARDIOVERSION N/A 09/08/2018   Procedure: TRANSESOPHAGEAL ECHOCARDIOGRAM (TEE);  Surgeon: Delford Maude BROCKS, MD;  Location: [facility] ENDOSCOPY;  Service: Cardiovascular;  Laterality: N/A;  with loop   TUBAL LIGATION     VEIN LIGATION AND STRIPPING      Family History  Problem Relation Age of Onset   Hypertension Mother    Renal Disease Mother    High blood pressure Mother    Heart disease  Mother    Cancer Father        stomach   Heart disease Father        No details   Cervical cancer Sister    Diabetes Sister    Multiple myeloma Sister    Cancer Brother    Diabetes Brother     Social History   Socioeconomic History   Marital status: Divorced    Spouse name: Not on file   Number of children: 2   Years of education: Not on file   Highest education level: Not on file  Occupational History   Not on file  Tobacco Use   Smoking status: Never   Smokeless tobacco: Never  Vaping Use   Vaping status: Never Used  Substance and Sexual Activity   Alcohol use: Not on file   Drug use: No   Sexual activity: Not  Currently  Other Topics Concern   Not on file  Social History Narrative   Lives with son.  One son is deceased.     Social Drivers of Corporate Investment Banker Strain: Low Risk  (07/22/2023)   Overall Financial Resource Strain (CARDIA)    Difficulty of Paying Living Expenses: Not hard at all  Food Insecurity: No Food Insecurity (07/22/2023)   Hunger Vital Sign    Worried About Running Out of Food in the Last Year: Never true    Ran Out of Food in the Last Year: Never true  Transportation Needs: No Transportation Needs (07/22/2023)   PRAPARE - Administrator, Civil Service (Medical): No    Lack of Transportation (Non-Medical): No  Physical Activity: Insufficiently Active (07/22/2023)   Exercise Vital Sign    Days of Exercise per Week: 7 days    Minutes of Exercise per Session: 20 min  Stress: No Stress Concern Present (07/15/2022)   [employer] of Occupational Health - Occupational Stress Questionnaire    Feeling of Stress : Not at all  Social Connections: Moderately Integrated (07/22/2023)   Social Connection and Isolation Panel [NHANES]    Frequency of Communication with Friends and Family: More than three times a week    Frequency of Social Gatherings with Friends and Family: More than three times a week    Attends Religious Services: More than 4 times per year    Active Member of [employer] or Organizations: Yes    Attends Engineer, Structural: More than 4 times per year    Marital Status: Divorced  Intimate Partner Violence: Not At Risk (07/22/2023)   Humiliation, Afraid, Rape, and Kick questionnaire    Fear of Current or Ex-Partner: No    Emotionally Abused: No    Physically Abused: No    Sexually Abused: No    ROS Review of Systems  Constitutional:  Negative for appetite change, chills, fatigue and fever.  HENT:  Negative for congestion, postnasal drip, rhinorrhea and sneezing.   Respiratory:  Negative for cough, shortness of breath and wheezing.    Cardiovascular:  Positive for leg swelling. Negative for chest pain and palpitations.  Gastrointestinal:  Negative for abdominal pain, constipation, nausea and vomiting.  Genitourinary:  Negative for difficulty urinating, dysuria, flank pain and frequency.  Musculoskeletal:  Negative for arthralgias, back pain, joint swelling and myalgias.  Skin:  Negative for color change, pallor, rash and wound.  Neurological:  Negative for dizziness, facial asymmetry, weakness, numbness and headaches.  Psychiatric/Behavioral:  Negative for behavioral problems, confusion, self-injury and suicidal ideas.  Objective:   Today's Vitals: BP (!) 95/56   Pulse 76   Temp (!) 97 F (36.1 C)   Wt (!) 312 lb (141.5 kg)   SpO2 99%   BMI 53.55 kg/m   Physical Exam Vitals and nursing note reviewed.  Constitutional:      General: She is not in acute distress.    Appearance: Normal appearance. She is obese. She is not ill-appearing, toxic-appearing or diaphoretic.  HENT:     Mouth/Throat:     Mouth: Mucous membranes are moist.     Pharynx: Oropharynx is clear. No oropharyngeal exudate or posterior oropharyngeal erythema.  Eyes:     General: No scleral icterus.       Right eye: No discharge.        Left eye: No discharge.     Extraocular Movements: Extraocular movements intact.     Conjunctiva/sclera: Conjunctivae normal.  Cardiovascular:     Rate and Rhythm: Normal rate and regular rhythm.     Pulses: Normal pulses.     Heart sounds: Normal heart sounds. No murmur heard.    No friction rub. No gallop.  Pulmonary:     Effort: Pulmonary effort is normal. No respiratory distress.     Breath sounds: No stridor. Wheezing present. No rhonchi or rales.  Chest:     Chest wall: No tenderness.  Abdominal:     General: There is no distension.     Palpations: Abdomen is soft.     Tenderness: There is no abdominal tenderness. There is no right CVA tenderness, left CVA tenderness or guarding.   Musculoskeletal:        General: No swelling, tenderness, deformity or signs of injury.     Right lower leg: Edema present.     Left lower leg: Edema present.     Comments: Using a walker for ambulation  Skin:    General: Skin is warm and dry.     Capillary Refill: Capillary refill takes less than 2 seconds.     Coloration: Skin is not jaundiced or pale.     Findings: No bruising, erythema or lesion.  Neurological:     Mental Status: She is alert and oriented to person, place, and time.     Motor: No weakness.     Coordination: Coordination normal.     Gait: Gait abnormal.  Psychiatric:        Mood and Affect: Mood normal.        Behavior: Behavior normal.        Thought Content: Thought content normal.        Judgment: Judgment normal.     Assessment & Plan:   Problem List Items Addressed This Visit       Cardiovascular and Mediastinum   Chronic diastolic CHF (congestive heart failure) (HCC) - Primary (Chronic)   Continue furosemide  40 mg daily Nonpitting edema noted on bilateral lower extremities, use of compression socks and elevation discussed      Relevant Orders   CMP14+EGFR     Respiratory   Chronic respiratory failure with hypoxia and hypercapnia (HCC)   Continue 3 L of oxygen  via nasal cannula Continue BiPAP at bedtime, singular 10 mg at bedtime, Zyrtec  10 mg daily, albuterol  inhaler 2 puffs every 4 hours as needed, Pulmicort  0.25 mg nebulizer solution twice daily Encouraged to maintain close follow-up with pulmonology         Genitourinary   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)   Lab Results  Component Value Date   NA 140 03/24/2023   K 4.1 03/24/2023   CO2 27 03/24/2023   GLUCOSE 84 03/24/2023   BUN 23 03/24/2023   CREATININE 1.38 (H) 03/24/2023   CALCIUM  8.4 (L) 03/24/2023   GFR 29.92 (L) 08/27/2022   EGFR 35.0 05/14/2023   GFRNONAA 45 (L) 03/03/2023  Avoid NSAIDs and other nephrotoxic agents Encouraged to maintain close follow-up  with nephrologist        Other   History of lung cancer (Chronic)   Currently in remission Sees oncologist yearly      Morbid obesity (HCC)   Wt Readings from Last 3 Encounters:  07/28/23 (!) 312 lb (141.5 kg)  07/22/23 (!) 315 lb 6.4 oz (143.1 kg)  05/04/23 (!) 320 lb (145.2 kg)   Body mass index is 53.55 kg/m.  Patient counseled on low-carb diet Encouraged to engage and regular moderate exercises as tolerated      Anemia of chronic disease   On ferrous sulfate  325 mg daily Continue current medication Lab Results  Component Value Date   WBC 6.2 03/01/2023   HGB 11.2 (L) 03/01/2023   HCT 38.0 03/01/2023   MCV 73.1 (L) 03/01/2023   PLT 143 (L) 03/01/2023         History of stroke   LDL is currently well-controlled on atorvastatin  40 mg daily, Zetia  10 mg daily Continue current medications      Influenza vaccine needed   Patient educated on CDC recommendation for the vaccine. Verbal consent was obtained from the patient, vaccine administered by nurse, no sign of adverse reactions noted at this time. Patient education on arm soreness and use of tylenol  or for this patient  was discussed. Patient educated on the signs and symptoms of adverse effect and advise to contact the office if they occur. Vaccine information sheet given to patient.        Relevant Orders   Flu Vaccine QUAD High Dose(Fluad) (Completed)   Bilateral leg edema   DASH diet advised Patient encouraged to keep legs elevated when sitting and wear compression socks Continue furosemide  40 mg daily      Dyslipidemia   Lab Results  Component Value Date   CHOL 113 06/30/2023   HDL 52 06/30/2023   LDLCALC 46 06/30/2023   TRIG 70 06/30/2023   CHOLHDL 2.2 06/30/2023  Well-controlled on atorvastatin  40 mg daily and Zetia  10 mg daily Continue current medications      Urinary incontinence   Uses depends as needed.      Macromastia   She is looking at doing breast reduction surgery much later in the  year       Outpatient Encounter Medications as of 07/28/2023  Medication Sig   acetaminophen  (TYLENOL ) 500 MG tablet Take 1,000 mg by mouth every 6 (six) hours as needed for mild pain.   albuterol  (PROAIR  HFA) 108 (90 Base) MCG/ACT inhaler Inhale 2 puffs into the lungs every 4 (four) hours as needed for wheezing or shortness of breath.   albuterol  (PROVENTIL ) (2.5 MG/3ML) 0.083% nebulizer solution USE 1 VIAL IN NEBULIZER EVERY 6 HOURS AS NEEDED FOR WHEEZING FOR SHORTNESS OF BREATH   aspirin  EC 81 MG tablet Take 1 tablet (81 mg total) by mouth daily. Swallow whole.   B Complex-C (B-COMPLEX WITH VITAMIN C) tablet Take 1 tablet by mouth daily.   budesonide  (PULMICORT ) 0.25 MG/2ML nebulizer solution One twice daily with albuterol    calcitRIOL  (ROCALTROL ) 0.25 MCG capsule Take 1 capsule (0.25 mcg total)  by mouth daily.   cetirizine  (ZYRTEC ) 10 MG chewable tablet Chew 10 mg by mouth daily.   diclofenac  Sodium (VOLTAREN ) 1 % GEL Apply 2 g topically 4 (four) times daily.   docusate sodium  (STOOL SOFTENER) 100 MG capsule Take 100 mg by mouth daily.   ezetimibe  (ZETIA ) 10 MG tablet Take 1 tablet (10 mg total) by mouth daily.   famotidine  (PEPCID ) 20 MG tablet TAKE 1 TABLET AFTER SUPPER   febuxostat  (ULORIC ) 40 MG tablet TAKE 1 TABLET EVERY DAY   ferrous sulfate  325 (65 FE) MG tablet Take 1 tablet (325 mg total) by mouth daily with lunch.   folic acid  (FOLVITE ) 1 MG tablet Take 1 tablet (1 mg total) by mouth daily.   furosemide  (LASIX ) 40 MG tablet TAKE 1 TABLET EVERY DAY   hydrOXYzine  (ATARAX ) 25 MG tablet Take 1 tablet (25 mg total) by mouth 3 (three) times daily as needed for itching.   Incontinence Supply Disposable (DEPEND UNDERWEAR LARGE/XL) MISC 1 each by Does not apply route 2 times daily at 12 noon and 4 pm.   montelukast  (SINGULAIR ) 10 MG tablet Take 1 tablet (10 mg total) by mouth at bedtime.   pantoprazole  (PROTONIX ) 40 MG tablet TAKE 1 TABLET EVERY DAY 30 TO 60 MINUTES BEFORE FIRST MEAL OF  THE DAY (Patient taking differently: Take 40 mg by mouth See admin instructions. TAKE 1 TABLET EVERY DAY 30 TO 60 MINUTES BEFORE FIRST MEAL OF THE DAY)   atorvastatin  (LIPITOR) 40 MG tablet Take 1 tablet (40 mg total) by mouth daily.   dextromethorphan  (DELSYM ) 30 MG/5ML liquid Take 5 mLs (30 mg total) by mouth 2 (two) times daily. (Patient not taking: Reported on 07/28/2023)   guaiFENesin  (MUCINEX ) 600 MG 12 hr tablet Take 1 tablet (600 mg total) by mouth 2 (two) times daily. (Patient not taking: Reported on 07/28/2023)   [DISCONTINUED] predniSONE  (DELTASONE ) 10 MG tablet Take 40mg  daily for 3days,Take 30mg  daily for 3days,Take 20mg  daily for 3days,Take 10mg  daily for 3days, then stop (Patient not taking: Reported on 05/06/2023)   No facility-administered encounter medications on file as of 07/28/2023.    Follow-up: Return in about 4 months (around 11/25/2023) for HTN.   Lashaunta Sicard R Timber Lucarelli, FNP

## 2023-07-28 NOTE — Assessment & Plan Note (Signed)
 On ferrous sulfate 325 mg daily Continue current medication Lab Results  Component Value Date   WBC 6.2 03/01/2023   HGB 11.2 (L) 03/01/2023   HCT 38.0 03/01/2023   MCV 73.1 (L) 03/01/2023   PLT 143 (L) 03/01/2023

## 2023-07-28 NOTE — Assessment & Plan Note (Signed)
 Lab Results  Component Value Date   CHOL 113 06/30/2023   HDL 52 06/30/2023   LDLCALC 46 06/30/2023   TRIG 70 06/30/2023   CHOLHDL 2.2 06/30/2023  Well-controlled on atorvastatin 40 mg daily and Zetia 10 mg daily Continue current medications

## 2023-07-28 NOTE — Assessment & Plan Note (Signed)
 Patient educated on CDC recommendation for the vaccine. Verbal consent was obtained from the patient, vaccine administered by nurse, no sign of adverse reactions noted at this time. Patient education on arm soreness and use of tylenol or  for this patient  was discussed. Patient educated on the signs and symptoms of adverse effect and advise to contact the office if they occur. Vaccine information sheet given to patient.

## 2023-07-29 LAB — CMP14+EGFR
ALT: 12 [IU]/L (ref 0–32)
AST: 23 [IU]/L (ref 0–40)
Albumin: 3.7 g/dL — ABNORMAL LOW (ref 3.9–4.9)
Alkaline Phosphatase: 148 [IU]/L — ABNORMAL HIGH (ref 44–121)
BUN/Creatinine Ratio: 22 (ref 12–28)
BUN: 34 mg/dL — ABNORMAL HIGH (ref 8–27)
Bilirubin Total: 0.5 mg/dL (ref 0.0–1.2)
CO2: 28 mmol/L (ref 20–29)
Calcium: 9.2 mg/dL (ref 8.7–10.3)
Chloride: 103 mmol/L (ref 96–106)
Creatinine, Ser: 1.52 mg/dL — ABNORMAL HIGH (ref 0.57–1.00)
Globulin, Total: 2.7 g/dL (ref 1.5–4.5)
Glucose: 83 mg/dL (ref 70–99)
Potassium: 4.2 mmol/L (ref 3.5–5.2)
Sodium: 144 mmol/L (ref 134–144)
Total Protein: 6.4 g/dL (ref 6.0–8.5)
eGFR: 37 mL/min/{1.73_m2} — ABNORMAL LOW (ref >59)

## 2023-09-07 DIAGNOSIS — I5033 Acute on chronic diastolic (congestive) heart failure: Secondary | ICD-10-CM | POA: Diagnosis not present

## 2023-09-07 DIAGNOSIS — N179 Acute kidney failure, unspecified: Secondary | ICD-10-CM | POA: Diagnosis not present

## 2023-09-07 DIAGNOSIS — M109 Gout, unspecified: Secondary | ICD-10-CM | POA: Diagnosis not present

## 2023-09-07 DIAGNOSIS — I130 Hypertensive heart and chronic kidney disease with heart failure and stage 1 through stage 4 chronic kidney disease, or unspecified chronic kidney disease: Secondary | ICD-10-CM | POA: Diagnosis not present

## 2023-09-07 DIAGNOSIS — N189 Chronic kidney disease, unspecified: Secondary | ICD-10-CM | POA: Diagnosis not present

## 2023-09-07 DIAGNOSIS — N1831 Chronic kidney disease, stage 3a: Secondary | ICD-10-CM | POA: Diagnosis not present

## 2023-09-07 DIAGNOSIS — D631 Anemia in chronic kidney disease: Secondary | ICD-10-CM | POA: Diagnosis not present

## 2023-09-07 DIAGNOSIS — N2581 Secondary hyperparathyroidism of renal origin: Secondary | ICD-10-CM | POA: Diagnosis not present

## 2023-09-08 LAB — LAB REPORT - SCANNED
Creatinine, POC: 119.7 mg/dL
EGFR: 38

## 2023-09-09 ENCOUNTER — Other Ambulatory Visit: Payer: Self-pay | Admitting: Internal Medicine

## 2023-09-09 DIAGNOSIS — D869 Sarcoidosis, unspecified: Secondary | ICD-10-CM

## 2023-09-13 ENCOUNTER — Other Ambulatory Visit: Payer: Self-pay | Admitting: Internal Medicine

## 2023-09-13 DIAGNOSIS — D869 Sarcoidosis, unspecified: Secondary | ICD-10-CM

## 2023-09-14 ENCOUNTER — Ambulatory Visit: Payer: Medicare HMO | Admitting: Podiatry

## 2023-09-14 ENCOUNTER — Encounter: Payer: Self-pay | Admitting: Podiatry

## 2023-09-14 DIAGNOSIS — M79675 Pain in left toe(s): Secondary | ICD-10-CM | POA: Diagnosis not present

## 2023-09-14 DIAGNOSIS — M79674 Pain in right toe(s): Secondary | ICD-10-CM

## 2023-09-14 DIAGNOSIS — Q828 Other specified congenital malformations of skin: Secondary | ICD-10-CM | POA: Diagnosis not present

## 2023-09-14 DIAGNOSIS — B351 Tinea unguium: Secondary | ICD-10-CM | POA: Diagnosis not present

## 2023-09-14 NOTE — Progress Notes (Unsigned)
 Subjective: Chief Complaint  Patient presents with   RFC    RM#13 RFC    73 y.o. returns the office today for painful, elongated, thickened toenails which she cannot trim herself.  Check toenails she has no other concerns today.  No open lesions that she reports.  Donell Beers, FNP Last seen: 07/28/2023   Objective: AAO 3, NAD DP/PT pulses palpable, CRT less than 3 seconds Chronic lower extremity edema. Nails hypertrophic, dystrophic, elongated, brittle, discolored 10. There is tenderness overlying the nails 1-5 bilaterally. There is no surrounding erythema or drainage along the nail sites. Hyperkeratotic lesions noted along the lateral aspect of the right heel without any underlying ulceration, drainage or any signs of infection. No open lesions bilaterally.  No pain with calf compression, swelling, warmth, erythema.  Assessment: Patient presents with symptomatic onychomycosis, hyperkeratotic lesion  Plan: -Treatment options including alternatives, risks, complications were discussed -Nails sharply debrided 10 without complication/bleeding. -Debrided hyperkeratotic lesion x 1 without any complications or bleeding. -Daily foot inspection discussed. -Follow-up in 3 months or sooner if any problems are to arise. In the meantime, encouraged to call the office with any questions, concerns, changes symptoms.  Return in about 3 months (around 12/12/2023).  Vivi Barrack DPM

## 2023-09-21 ENCOUNTER — Ambulatory Visit (INDEPENDENT_AMBULATORY_CARE_PROVIDER_SITE_OTHER)

## 2023-09-21 ENCOUNTER — Ambulatory Visit: Payer: Medicare HMO | Admitting: Adult Health

## 2023-09-21 ENCOUNTER — Encounter: Payer: Self-pay | Admitting: Adult Health

## 2023-09-21 VITALS — BP 120/74 | HR 71 | Temp 98.4°F | Ht 65.0 in | Wt 295.4 lb

## 2023-09-21 DIAGNOSIS — J9611 Chronic respiratory failure with hypoxia: Secondary | ICD-10-CM

## 2023-09-21 DIAGNOSIS — J441 Chronic obstructive pulmonary disease with (acute) exacerbation: Secondary | ICD-10-CM | POA: Diagnosis not present

## 2023-09-21 DIAGNOSIS — G4733 Obstructive sleep apnea (adult) (pediatric): Secondary | ICD-10-CM

## 2023-09-21 DIAGNOSIS — Z9981 Dependence on supplemental oxygen: Secondary | ICD-10-CM | POA: Diagnosis not present

## 2023-09-21 DIAGNOSIS — J9612 Chronic respiratory failure with hypercapnia: Secondary | ICD-10-CM | POA: Diagnosis not present

## 2023-09-21 DIAGNOSIS — J45901 Unspecified asthma with (acute) exacerbation: Secondary | ICD-10-CM | POA: Diagnosis not present

## 2023-09-21 DIAGNOSIS — J4541 Moderate persistent asthma with (acute) exacerbation: Secondary | ICD-10-CM | POA: Diagnosis not present

## 2023-09-21 MED ORDER — AMOXICILLIN-POT CLAVULANATE 875-125 MG PO TABS
1.0000 | ORAL_TABLET | Freq: Two times a day (BID) | ORAL | 0 refills | Status: DC
Start: 2023-09-21 — End: 2023-10-08

## 2023-09-21 MED ORDER — PREDNISONE 10 MG PO TABS: ORAL_TABLET | ORAL | 0 refills | Status: DC

## 2023-09-21 MED ORDER — METHYLPREDNISOLONE ACETATE 80 MG/ML IJ SUSP
120.0000 mg | Freq: Once | INTRAMUSCULAR | Status: AC
Start: 2023-09-21 — End: 2023-09-21
  Administered 2023-09-21: 120 mg via INTRAMUSCULAR

## 2023-09-21 MED ORDER — ALBUTEROL SULFATE (2.5 MG/3ML) 0.083% IN NEBU
2.5000 mg | INHALATION_SOLUTION | Freq: Once | RESPIRATORY_TRACT | Status: AC
  Administered 2023-09-21: 2.5 mg via RESPIRATORY_TRACT

## 2023-09-21 NOTE — Assessment & Plan Note (Signed)
 OSA and OHS with excellent control and compliance on BiPAP ST.  Continue on current settings  Plan  Patient Instructions  Begin Augmentin Twice daily  for 1 week  Begin Prednisone taper -begin tomorrow  Depo Medrol 120mg  IM shot today office.  Chest xray today  Mucinex DM Twice daily  As needed  cough/congestion  Add Trelegy inhaler 1 puff daily until sample is gone.  Continue on Budesonide and Albuterol neb Twice daily   Albuterol inhaler or neb every 6hr as needed.  Singulair 10mg  At bedtime  Zyrtec 10mg  daily  Albuterol inhaler or neb As needed   Continue on Oxygen 3-4 l/m to keep O2 sats >90%.  Continue on Lasix 40mg  daily  Continue with follow up with Cardiology and Nephrology  Continue on BIPAP ST machine At bedtime  and with naps.  Follow up with in 2 weeks with Dr. Sherene Sires or Unknown Flannigan NP and As needed   Please contact office for sooner follow up if symptoms do not improve or worsen or seek emergency care

## 2023-09-21 NOTE — Progress Notes (Signed)
 @Patient  ID: Pamela [name], female    DOB: [birth year], 73 y.o.   MRN: [id number]  Chief Complaint  Patient presents with   Follow-up    Referring provider: Donell Beers, FNP  HPI: 73 year old female never smoker followed for asthma with allergic phenotype, sarcoidosis, history of lung cancer (diagnosed in 2012 status post SBRT) Chronic respiratory failure on oxygen-2013 through 2018, restarted 2023 Medical history significant for right anterior chest wall mass/right pectoralis minor muscle mass) biopsy done in 2015 which was nondiagnostic, PET scan October 2016 showed no changes in metabolic activity or size suggestive of a benign etiology, chronic kidney disease, congestive heart failure, PFO on echo-bubble study, prior CVA  TEST/EVENTS :  CT chest Nov 20, 2021 similar posttreatment fibrosis/consolidation with volume loss in the right upper lobe.  No evidence of recurrence, tiny right upper lobe pulmonary nodule no suspicious pulmonary nodules.  A 5.5 x 2.1 cm soft tissue mass in the right pectoralis major and minor musculature.    pfts 07/31/13  FEV1  1.53 (58%) with ratio 66 > p saba ratio 74 FEV1 1.68 (64%)    FENO 06/01/2016  =   85    Spirometry 02/22/2018  FEV1 1.23 (57%)  Ratio 71 with min curvature  - FENO 02/22/2018  = 61   05/2022 PFTs today show severe restriction with FEV1 at 51%, ratio 85, FVC 46%, no significant bronchodilator response, DLCO 69%.    Split night sleep study in 07/2014 showed mild OSA only with AHI 6.9. O2 sats nml on 2l/m .    She also carries dx of Sarcoid from previous bx. (followed previously by Dr. Evelena Peat at [facility], with lymphadenopathy in chest , abd w/ possible spleen and liver involvement.    03/2022 CT chest neg for PE, stable RUL scarring likely radiation changes.    CT chest Nov 30, 2022 stable platelike scarring/radiation changes in the right upper lobe, no evidence of recurrent or metastatic disease   11/2022 Absolute  Eosinophil 1,500   09/21/2023 Acute OV : Asthma, O2 RF , Lung cancer hx , OSA/OHS She presents for an acute office visit.  Complains over the last week that she has had increased cough, congestion, thick mucus and wheezing.  Has been more short of breath than usual.  She denies any fever, hemoptysis, chest pain, orthopnea or increased leg swelling.  Patient remains on oxygen 3 L.  Does use 4 L if she is walking around.  Appetite is good with no nausea vomiting or diarrhea.  Patient has increased her albuterol nebulizer to 4 times daily.   Patient has sleep apnea and OHS is on nocturnal BiPAP ST.  She says she wears it every single night.  Feels that she benefits from it.  She denies any significant daytime sleepiness.  Denies any increased lethargy or confusion.  She does live at home with her son.  She uses scat for transportation.  BiPAP download shows excellent compliance with daily average usage at 5.5 hours.  AHI 1.0.  Current settings are AVAPS mode, EPAP 5, minimum set pressure support 6, max pressure support 20 cm H2O.,  Backup rate 12     Allergies  Allergen Reactions   Sulfa Antibiotics Hives and Rash    Immunization History  Administered Date(s) Administered   Fluad Quad(high Dose 65+) 07/28/2023   Influenza,inj,Quad PF,6+ Mos 04/13/2014, 04/16/2015, 04/03/2016, 05/19/2017, 06/15/2018, 05/14/2019, 04/21/2022   PFIZER(Purple Top)SARS-COV-2 Vaccination 10/14/2019, 11/08/2019, 07/08/2020, 03/06/2021   PNEUMOCOCCAL CONJUGATE-20 03/06/2021  Pneumococcal Conjugate-13 04/03/2016   Pneumococcal Polysaccharide-23 04/13/2014, 12/26/2019   Tdap 01/02/2015   Zoster, Live 05/21/2014    Past Medical History:  Diagnosis Date   Arthritis    Asthma    Cancer (HCC)    lung, adenocarcinoma right lung 2012   CHF (congestive heart failure) (HCC)    Preserved EF   Congenital single kidney    With chronic kidney disease   COPD (chronic obstructive pulmonary disease) (HCC)    Gout     Hypertension    Lung cancer (HCC) 2012   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 Patient notes it was consistent with sarcoidosis but actual pathology results not available.   Oxygen deficiency    Sarcoidosis    Sickle cell trait (HCC)    Sleep apnea     Tobacco History: Social History   Tobacco Use  Smoking Status Never  Smokeless Tobacco Never   Counseling given: Not Answered   Outpatient Medications Prior to Visit  Medication Sig Dispense Refill   acetaminophen (TYLENOL) 500 MG tablet Take 1,000 mg by mouth every 6 (six) hours as needed for mild pain.     albuterol (PROAIR HFA) 108 (90 Base) MCG/ACT inhaler Inhale 2 puffs into the lungs every 4 (four) hours as needed for wheezing or shortness of breath. 8 g 5   albuterol (PROVENTIL) (2.5 MG/3ML) 0.083% nebulizer solution INHALE 1 VIAL IN NEBULIZER EVERY 6 HOURS AS NEEDED FOR WHEEZING FOR SHORTNESS OF BREATH 300 mL 0   aspirin EC 81 MG tablet Take 1 tablet (81 mg total) by mouth daily. Swallow whole. 30 tablet 11   B Complex-C (B-COMPLEX WITH VITAMIN C) tablet Take 1 tablet by mouth daily. 120 tablet 0   budesonide (PULMICORT) 0.25 MG/2ML nebulizer solution One twice daily with albuterol 120 mL 12   calcitRIOL (ROCALTROL) 0.25 MCG capsule Take 1 capsule (0.25 mcg total) by mouth daily. 90 capsule 2   cetirizine (ZYRTEC) 10 MG chewable tablet Chew 10 mg by mouth daily.     dextromethorphan (DELSYM) 30 MG/5ML liquid Take 5 mLs (30 mg total) by mouth 2 (two) times daily. 89 mL 0   diclofenac Sodium (VOLTAREN) 1 % GEL Apply 2 g topically 4 (four) times daily. 100 g 0   docusate sodium (STOOL SOFTENER) 100 MG capsule Take 100 mg by mouth daily.     famotidine (PEPCID) 20 MG tablet TAKE 1 TABLET AFTER SUPPER 90 tablet 3   febuxostat (ULORIC) 40 MG tablet TAKE 1  TABLET EVERY DAY 90 tablet 3   folic acid (FOLVITE) 1 MG tablet Take 1 tablet (1 mg total) by mouth daily. 120 tablet 0   furosemide (LASIX) 40 MG tablet TAKE 1 TABLET EVERY DAY 90 tablet 3   hydrOXYzine (ATARAX) 25 MG tablet Take 1 tablet (25 mg total) by mouth 3 (three) times daily as needed for itching. 30 tablet 0   Incontinence Supply Disposable (DEPEND UNDERWEAR LARGE/XL) MISC 1 each by Does not apply [name] 2 times daily at 12 noon and 4 pm. 17 each 12   montelukast (SINGULAIR) 10 MG tablet Take 1 tablet (10 mg total) by mouth at bedtime. 90 tablet 3   pantoprazole (PROTONIX) 40 MG tablet TAKE 1 TABLET EVERY DAY 30 TO 60 MINUTES BEFORE FIRST MEAL OF THE DAY (Patient  taking differently: Take 40 mg by mouth See admin instructions. TAKE 1 TABLET EVERY DAY 30 TO 60 MINUTES BEFORE FIRST MEAL OF THE DAY) 90 tablet 3   atorvastatin (LIPITOR) 40 MG tablet Take 1 tablet (40 mg total) by mouth daily. 90 tablet 3   ezetimibe (ZETIA) 10 MG tablet Take 1 tablet (10 mg total) by mouth daily. 90 tablet 3   ferrous sulfate 325 (65 FE) MG tablet Take 1 tablet (325 mg total) by mouth daily with lunch. 30 tablet 0   guaiFENesin (MUCINEX) 600 MG 12 hr tablet Take 1 tablet (600 mg total) by mouth 2 (two) times daily. (Patient not taking: Reported on 09/21/2023) 60 tablet 0   No facility-administered medications prior to visit.     Review of Systems:   Constitutional:   No  weight loss, night sweats,  Fevers, chills, fatigue, or  lassitude.  HEENT:   No headaches,  Difficulty swallowing,  Tooth/dental problems, or  Sore throat,                No sneezing, itching, ear ache, nasal congestion, post nasal drip,   CV:  No chest pain,  Orthopnea, PND, swelling in lower extremities, anasarca, dizziness, palpitations, syncope.   GI  No heartburn, indigestion, abdominal pain, nausea, vomiting, diarrhea, change in bowel habits, loss of appetite, bloody stools.   Resp: .  No chest wall deformity  Skin: no rash or  lesions.  GU: no dysuria, change in color of urine, no urgency or frequency.  No flank pain, no hematuria   MS:  No joint pain or swelling.  No decreased range of motion.  No back pain.    Physical Exam  BP 120/74 (BP Location: Left Arm, Patient Position: Sitting, Cuff Size: Large)   Pulse 71   Temp 98.4 F (36.9 C) (Oral)   Ht 5\' 5"  (1.651 m)   Wt 295 lb 6.4 oz (134 kg)   SpO2 97% Comment: 4L  BMI 49.16 kg/m   GEN: A/Ox3; pleasant , NAD, well nourished , walker , O2    HEENT:  [place]/AT,  EACs-clear, TMs-wnl, NOSE-clear, THROAT-clear, no lesions, no postnasal drip or exudate noted.   NECK:  Supple w/ fair ROM; no JVD; normal carotid impulses w/o bruits; no thyromegaly or nodules palpated; no lymphadenopathy.    RESP  coarse rhonchi, exp wheezing.  no accessory muscle use, no dullness to percussion speaks in full sentences   CARD:  RRR, no m/r/g, no peripheral edema, pulses intact, no cyanosis or clubbing.  GI:   Soft & nt; nml bowel sounds; no organomegaly or masses detected.   Musco: Warm bil, no deformities or joint swelling noted.   Neuro: alert, no focal deficits noted.    Skin: Warm, no lesions or rashes    Lab Results:  CBC  BMET  Imaging: No results found.  albuterol (PROVENTIL) (2.5 MG/3ML) 0.083% nebulizer solution 2.5 mg     Date Action Dose [name] User   09/21/2023 1036 Given 2.5 mg Nebulization Obike, Mercy, CMA      methylPREDNISolone acetate (DEPO-MEDROL) injection 120 mg     Date Action Dose [name] User   09/21/2023 1037 Given 120 mg Intramuscular (Right Ventrogluteal) Maisie Fus, CMA          Latest Ref Rng & Units 05/22/2022    8:13 AM 08/30/2018   10:03 AM 07/19/2014   11:01 AM  PFT Results  FVC-Pre L 1.41  1.92  2.32   FVC-Predicted Pre %  42  69  85   FVC-Post L 1.51  2.13  2.36   FVC-Predicted Post % 46  77  86   Pre FEV1/FVC % % 84  83  71   Post FEV1/FCV % % 85  79  85   FEV1-Pre L 1.18  1.59  1.64   FEV1-Predicted Pre % 47  74   76   FEV1-Post L 1.29  1.69  2.00   DLCO uncorrected ml/min/mmHg 14.60  23.90  18.43   DLCO UNC% % 69  112  71   DLCO corrected ml/min/mmHg 14.60     DLCO COR %Predicted % 69     DLVA Predicted % 111  154  101   TLC L 3.59     TLC % Predicted % 67     RV % Predicted % 66       Lab Results  Component Value Date   NITRICOXIDE 56 08/30/2018        Assessment & Plan:   Moderate persistent asthma with acute exacerbation Acute asthmatic bronchitic exacerbation-patient with symptoms of cough, congestion and wheezing over the last week.  Will give her a sample of Trelegy to use over the next 5 days.  Continue on budesonide and albuterol nebulizer twice daily.  May use albuterol every 6 hours as needed.  Depo-Medrol 120 IM injection given in the office.  Albuterol neb treatment given in the office.  Chest x-ray is pending.  Will have her come in for close follow-up.  Would recommend on return consider adding an Brovana  into her maintenance regimen.  Advised if symptoms are not improving will need to seek emergency room care and consideration for hospitalization.  Patient is aware and verbalizes understanding  Plan  Patient Instructions  Begin Augmentin Twice daily  for 1 week  Begin Prednisone taper -begin tomorrow  Depo Medrol 120mg  IM shot today office.  Chest xray today  Mucinex DM Twice daily  As needed  cough/congestion  Add Trelegy inhaler 1 puff daily until sample is gone.  Continue on Budesonide and Albuterol neb Twice daily   Albuterol inhaler or neb every 6hr as needed.  Singulair 10mg  At bedtime  Zyrtec 10mg  daily  Albuterol inhaler or neb As needed   Continue on Oxygen 3-4 l/m to keep O2 sats >90%.  Continue on Lasix 40mg  daily  Continue with follow up with Cardiology and Nephrology  Continue on BIPAP ST machine At bedtime  and with naps.  Follow up with in 2 weeks with Dr. Sherene Sires or Aune Adami NP and As needed   Please contact office for sooner follow up if symptoms do  not improve or worsen or seek emergency care     Chronic respiratory failure with hypoxia and hypercapnia (HCC) Continue on oxygen to maintain O2 saturations greater than 88 to 90%.  Obstructive sleep apnea OSA and OHS with excellent control and compliance on BiPAP ST.  Continue on current settings  Plan  Patient Instructions  Begin Augmentin Twice daily  for 1 week  Begin Prednisone taper -begin tomorrow  Depo Medrol 120mg  IM shot today office.  Chest xray today  Mucinex DM Twice daily  As needed  cough/congestion  Add Trelegy inhaler 1 puff daily until sample is gone.  Continue on Budesonide and Albuterol neb Twice daily   Albuterol inhaler or neb every 6hr as needed.  Singulair 10mg  At bedtime  Zyrtec 10mg  daily  Albuterol inhaler or neb As needed   Continue on Oxygen 3-4  l/m to keep O2 sats >90%.  Continue on Lasix 40mg  daily  Continue with follow up with Cardiology and Nephrology  Continue on BIPAP ST machine At bedtime  and with naps.  Follow up with in 2 weeks with Dr. Sherene Sires or Arly Salminen NP and As needed   Please contact office for sooner follow up if symptoms do not improve or worsen or seek emergency care      I spent   41 minutes dedicated to the care of this patient on the date of this encounter to include pre-visit review of records, face-to-face time with the patient discussing conditions above, post visit ordering of testing, clinical documentation with the electronic health record, making appropriate referrals as documented, and communicating necessary findings to members of the patients care team.   Rubye Oaks, NP 09/21/2023

## 2023-09-21 NOTE — Patient Instructions (Addendum)
 Begin Augmentin Twice daily  for 1 week  Begin Prednisone taper -begin tomorrow  Depo Medrol 120mg  IM shot today office.  Chest xray today  Mucinex DM Twice daily  As needed  cough/congestion  Add Trelegy inhaler 1 puff daily until sample is gone.  Continue on Budesonide and Albuterol neb Twice daily   Albuterol inhaler or neb every 6hr as needed.  Singulair 10mg  At bedtime  Zyrtec 10mg  daily  Albuterol inhaler or neb As needed   Continue on Oxygen 3-4 l/m to keep O2 sats >90%.  Continue on Lasix 40mg  daily  Continue with follow up with Cardiology and Nephrology  Continue on BIPAP ST machine At bedtime  and with naps.  Follow up with in 2 weeks with Dr. Sherene Sires or Laelia Angelo NP and As needed   Please contact office for sooner follow up if symptoms do not improve or worsen or seek emergency care

## 2023-09-21 NOTE — Assessment & Plan Note (Addendum)
 Acute asthmatic bronchitic exacerbation-patient with symptoms of cough, congestion and wheezing over the last week.  Will give her a sample of Trelegy to use over the next 5 days.  Treat with empiric antibiotics /steroids -Augmentin and Prednisone taper Continue on budesonide and albuterol nebulizer twice daily.  May use albuterol every 6 hours as needed.  Depo-Medrol 120 IM injection given in the office.  Albuterol neb treatment given in the office.  Chest x-ray is pending.  Will have her come in for close follow-up.  Would recommend on return consider adding an Brovana  into her maintenance regimen.  Advised if symptoms are not improving will need to seek emergency room care and consideration for hospitalization.  Patient is aware and verbalizes understanding  Plan  Patient Instructions  Begin Augmentin Twice daily  for 1 week  Begin Prednisone taper -begin tomorrow  Depo Medrol 120mg  IM shot today office.  Chest xray today  Mucinex DM Twice daily  As needed  cough/congestion  Add Trelegy inhaler 1 puff daily until sample is gone.  Continue on Budesonide and Albuterol neb Twice daily   Albuterol inhaler or neb every 6hr as needed.  Singulair 10mg  At bedtime  Zyrtec 10mg  daily  Albuterol inhaler or neb As needed   Continue on Oxygen 3-4 l/m to keep O2 sats >90%.  Continue on Lasix 40mg  daily  Continue with follow up with Cardiology and Nephrology  Continue on BIPAP ST machine At bedtime  and with naps.  Follow up with in 2 weeks with Dr. Sherene Sires or Isis Costanza NP and As needed   Please contact office for sooner follow up if symptoms do not improve or worsen or seek emergency care

## 2023-09-21 NOTE — Assessment & Plan Note (Signed)
Continue on oxygen to maintain O2 saturations greater than 88 to 90%. 

## 2023-09-23 ENCOUNTER — Other Ambulatory Visit: Payer: Self-pay | Admitting: Internal Medicine

## 2023-09-23 ENCOUNTER — Other Ambulatory Visit: Payer: Self-pay | Admitting: Family Medicine

## 2023-09-23 NOTE — Telephone Encounter (Signed)
 Please advise [facility]

## 2023-10-07 NOTE — Progress Notes (Unsigned)
 Subjective:    Patient ID: Pamela [name], female    DOB: 10/15/1950  MRN: [id number]  Brief patient profile:  41 yobf never smoker but lots of second hand exp with 1st asthma attack in 1990s and on maint rx since late 90's  and freq courses of prednisone=  3-4 x per year despite advair and spiriva and freq saba referred to pulmonary clinic 04/27/2014 by Dr Ashley Royalty s/p CT Bx 06/25/11 > no path report epic  but per oncology = [id number] adenoca of lung >  RT only per pt RUL completed 09/2011.     History of Present Illness  04/27/2014 1st [facility] office visit/ Pamela [name]   Chief Complaint  Patient presents with   Pulmonary Consult    Referred by Dr. Hart Rochester. [name]  Pt recently moved to [place] from [place] and states needs to establish with new pulmonary MD.  She c/o DOE and cough, and states that she feels these symptoms are currently under control.    on prednisone 10 mg daily  since late July 2015 and still on freq saba and 2lpm  Already used   2puffs proair am of ov  rec Stop spiriva and advair Start dulera 100 Take 2 puffs first thing in am and then another 2 puffs about 12 hours later.  Work on inhaler technique:  relax and gently blow all the way out then take a nice smooth deep breath back in, triggering the inhaler at same time you start breathing in.  Hold for up to 5 seconds if you can.  Rinse and gargle with water when done Only use your albuterol (proair) as a rescue medication  If proair not helping, then use the neb and if needing the neb more than occastional, then take prednisone 10 mg daily  Please schedule a follow up office visit in 4 weeks, sooner if needed with all inhalers in hand for pfts on return    Admit date: 08/16/2022 Discharge date: 08/25/2022      Recommendations for Outpatient Follow-up:  Follow up with PCP in 1-2 weeks Please obtain BMP/CBC in one week your next doctors visit.  Prednisone and  iron tablets prescribed.  Advised to follow-up outpatient pulmonary next 5 to 7 days. She tells me she is enough bronchodilators at home. 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  Over the course of several days patient started doing better, she completed course of doxycycline in the hospital.  IV Solu-Medrol was transitioned to p.o. prednisone. PT/OT recommended home health therefore arrangements were made.       Assessment & Plan:  Principal Problem:   COPD with acute exacerbation (HCC)   Chronic diastolic CHF (congestive heart failure) (HCC)   Stage 3b chronic kidney disease (HCC)   Prediabetes   Obstructive sleep apnea   Acute on chronic respiratory failure with hypoxia and hypercapnia (HCC)   Vitamin D deficiency   Acute respiratory failure with hypercapnia (HCC)     Acute respiratory failure with hypoxia and hypercarbia likely due to COPD exacerbation:  Overall patient was very slow to improve and today her breath sounds are much clear.  She has completed course of doxycycline.  Transition to p.o. prednisone for 3 more days.  Continue using home bronchodilators, she tells me she has enough supplies at home.  I have advised she follow-up outpatient with her pulmonary next 5 to 7 days.     Chronic diastolic CHF (congestive heart failure) (HCC) BN P unremarkable, 2D echo showed an EF of 60% no wall motion abnormality. Continue Lasix 40 mg orally daily   Chronic kidney disease stage IIIb:  Creatinine around baseline of 1.4.  On daily iron at home      01/29/2023   f/u ov/Pamela [name] re: asthma/MO   maint on montelukast  /  qid alb/bud and gerd rx  though easily confused with details of care  Chief Complaint  Patient presents with   Follow-up    States using O2, having cough  with some clear sputum at time, DOE at times  Dyspnea:  says better  Cough: worse x one month but mucus clear  Sleeping: flat bed with 4 pillows no cough or sob  SABA NWG:NFAO  not helping at qid alb / bud  02: 3lpm  Rec My office will be contacting you by phone for referral to ENT specialist   - if you don't hear back from my office within one week please call us back or notify us thru MyChart and we'll address it right away.  For cough > mucinex dm 1200 mg every 12 hours and use flutter valve as much as you can  No change in your medications - if getting worse please return to Dr Celine Mans  Please schedule a follow up office visit in 6-8  weeks, call sooner if needed with all medications /inhalers/ solutions in hand   NP recs  09/21/23  Begin Augmentin Twice daily  for 1 week  Begin Prednisone taper -begin tomorrow  Depo Medrol 120mg  IM shot today office.  Chest xray today  Mucinex DM Twice daily  As needed  cough/congestion  Add Trelegy inhaler 1 puff daily until sample is gone.  Continue on Budesonide and Albuterol neb Twice daily   Albuterol inhaler or neb every 6hr as needed.  Singulair 10mg  At bedtime  Zyrtec 10mg  daily  Albuterol inhaler or neb As needed   Continue on Oxygen 3-4 l/m to keep O2 sats >90%.  Continue on Lasix 40mg  daily  Continue with follow up with Cardiology and Nephrology  Continue on BIPAP ST machine At bedtime  and with naps.     10/08/2023  f/u ov/Pamela [name] re: asthma chronic severe plus MO   maint on nothing  did no  bring meds Chief Complaint  Patient presents with   Follow-up    Breathing has improved since her last visit. She has not used albuterol in the past couple of weeks.     Dyspnea:  rollator walking [employer]  Cough: none   Sleeping: BIPAP ST  02 plugged in  SABA use: none  02: 3lpm - 4lpm    No obvious day to day or daytime variability or assoc excess/ purulent sputum or mucus plugs or hemoptysis or cp or chest tightness, subjective wheeze or overt sinus or hb symptoms.    Also denies any obvious fluctuation of symptoms with weather or environmental changes or other aggravating or alleviating factors except as outlined above   No unusual exposure hx or h/o childhood pna/ asthma or knowledge  of premature birth.  Current Allergies, Complete Past Medical History, Past Surgical History, Family History, and Social History were reviewed in [employer] record.  ROS  The following are not active complaints unless bolded Hoarseness, sore throat, dysphagia, dental problems, itching, sneezing,  nasal congestion or discharge of excess mucus or purulent secretions, ear ache,   fever, chills, sweats, unintended wt loss or wt gain, classically pleuritic or exertional cp,  orthopnea pnd or arm/hand swelling  or leg swelling, presyncope, palpitations, abdominal pain, anorexia, nausea, vomiting, diarrhea  or change in bowel habits or change in bladder habits, change in stools or change in urine, dysuria, hematuria,  rash, arthralgias, visual complaints, headache, numbness, weakness or ataxia or problems with walking/rollator  or coordination,  change in mood or  memory.        Current Meds  Medication Sig   acetaminophen (TYLENOL) 500 MG tablet Take 1,000 mg by mouth every 6 (six) hours as needed for mild pain.   albuterol (PROAIR HFA) 108 (90 Base) MCG/ACT inhaler Inhale 2 puffs into the lungs every 4 (four) hours as needed for wheezing or shortness of breath.   albuterol (PROVENTIL) (2.5 MG/3ML) 0.083% nebulizer solution INHALE 1 VIAL IN NEBULIZER EVERY 6 HOURS AS NEEDED FOR WHEEZING FOR SHORTNESS OF BREATH   aspirin EC 81 MG tablet Take 1 tablet (81 mg total) by mouth daily. Swallow whole.    atorvastatin (LIPITOR) 40 MG tablet Take 1 tablet (40 mg total) by mouth daily.   B Complex-C (B-COMPLEX WITH VITAMIN C) tablet Take 1 tablet by mouth daily.   budesonide (PULMICORT) 0.25 MG/2ML nebulizer solution One twice daily with albuterol   calcitRIOL (ROCALTROL) 0.25 MCG capsule TAKE 1 CAPSULE EVERY DAY   cetirizine (ZYRTEC) 10 MG chewable tablet Chew 10 mg by mouth daily.   dextromethorphan (DELSYM) 30 MG/5ML liquid Take 5 mLs (30 mg total) by mouth 2 (two) times daily.   diclofenac Sodium (VOLTAREN) 1 % GEL Apply 2 g topically 4 (four) times daily.   docusate sodium (STOOL SOFTENER) 100 MG capsule Take 100 mg by mouth daily.   famotidine (PEPCID) 20 MG tablet TAKE 1 TABLET AFTER SUPPER   febuxostat (ULORIC) 40 MG tablet TAKE 1 TABLET EVERY DAY   folic acid (FOLVITE) 1 MG tablet Take 1 tablet (1 mg total) by mouth daily.   furosemide (LASIX) 40 MG tablet TAKE 1 TABLET EVERY DAY   hydrOXYzine (ATARAX) 25 MG tablet Take 1 tablet (25 mg total) by mouth 3 (three) times daily as needed for itching.   Incontinence Supply Disposable (DEPEND UNDERWEAR LARGE/XL) MISC 1 each by Does not apply route 2 times daily at 12 noon and 4 pm.   montelukast (SINGULAIR) 10 MG tablet TAKE 1 TABLET AT BEDTIME   pantoprazole (PROTONIX) 40 MG tablet TAKE 1 TABLET EVERY DAY 30 TO 60 MINUTES BEFORE FIRST MEAL OF THE DAY                Objective:   Physical Exam  Wts  10/08/2023   304   01/29/2023   314   10/08/2022   327  08/27/2022     314  11/12/2021   354  11/12/2020   342 05/13/2020 325  04/01/2020  330   02/23/2019    354   08/30/2018  366   08/02/2018  360   11/30/2014   343   Vital signs reviewed  10/08/2023  - Note at rest 02 sats  99% on 3lpm POC  General appearance:    amb MO (by bmi) bf nad     HEENT : Oropharynx  clear       NECK :  without  apparent JVD/ palpable Nodes/TM    LUNGS: no acc muscle use,  Nl contour chest which is clear to A and P bilaterally without cough on insp or exp  maneuvers   CV:  RRR  no s3 or murmur or increase in P2, and massive bilateral LE swelling    ABD:  soft and nontender   MS:  Gait walks with rollator  ext warm without deformities Or obvious joint restrictions  calf tenderness, cyanosis or clubbing    SKIN: warm and dry without lesions    NEURO:  alert, approp, nl sensorium with  no motor or cerebellar deficits apparent.       I personally reviewed images and agree with radiology impression as follows:  CXR:   pa and lateral  No active dz

## 2023-10-08 ENCOUNTER — Ambulatory Visit: Admitting: Internal Medicine

## 2023-10-08 ENCOUNTER — Encounter: Payer: Self-pay | Admitting: Internal Medicine

## 2023-10-08 VITALS — BP 126/74 | HR 62 | Ht 65.0 in | Wt 304.0 lb

## 2023-10-08 DIAGNOSIS — Z9981 Dependence on supplemental oxygen: Secondary | ICD-10-CM

## 2023-10-08 DIAGNOSIS — J9611 Chronic respiratory failure with hypoxia: Secondary | ICD-10-CM | POA: Diagnosis not present

## 2023-10-08 DIAGNOSIS — J441 Chronic obstructive pulmonary disease with (acute) exacerbation: Secondary | ICD-10-CM | POA: Diagnosis not present

## 2023-10-08 DIAGNOSIS — J9612 Chronic respiratory failure with hypercapnia: Secondary | ICD-10-CM

## 2023-10-08 DIAGNOSIS — Z6841 Body Mass Index (BMI) 40.0 and over, adult: Secondary | ICD-10-CM | POA: Diagnosis not present

## 2023-10-08 NOTE — Patient Instructions (Signed)
 Resume you albuterol / budesonide twice daily   Please schedule a follow up visit in 3 months but call sooner if needed to see [name] NP re BiPAP

## 2023-10-08 NOTE — Assessment & Plan Note (Signed)
 Onset asthma 1990's while living in [place]  pfts 07/31/13  FEV1  1.53 (58%) with ratio 66 > p saba ratio 74 FEV1 1.68 (64%)  - -04/27/2014  try dulera 100 2 bid  - PFT's 07/19/2014 FEV1 2.00 (93%) and ratio 85 after 21% improvement from saba with no inhalers x one week  - trial off acei 12/01/2014 due to pseudoasthma component > resolved  - Spirometry 06/01/2016  FEV1 1.06 (48%)  Ratio 61  - FENO 06/01/2016  =   85 on symbicort 80 x2 > increase to 160  - Prednisone 10 mg floor x years, rec increase  to 20 mg if needing neb  - 10/15/2016 reduced pred to 5 mg daily (gout flares with taper)  - Spirometry 02/22/2018  FEV1 1.23 (57%)  Ratio 71 with min curvature  - FENO 02/22/2018  = 61 08/02/2018  After extensive coaching inhaler device,  effectiveness =    90%  FENO 08/30/2018  =  57 p symb 160 2bid > rec add singulair 10 mg daily  PFT's  08/30/2018  FEV1 1.69 (78 % ) ratio 0.79  p 54 % improvement from saba p symb 160 x2  prior to study with DLCO  112 % corrects to 154  % for alv volume   - 04/01/2020  Added pred x 6 days as Plan d  - 05/13/2020  After extensive coaching inhaler device,  effectiveness =    25%   Really very poor with devices x for neb so rec continue maint rx with bud/albuterol bid and f/u q 3 m sooner prn

## 2023-10-08 NOTE — Assessment & Plan Note (Signed)
 Started 2lpm 24/7 in [place] around 2013  See pfts 07/19/2014 with completely reversible airflow obst (so this is not copd) and a dlco of 71% so this is not ILD - [facility]   05/26/16  = 34 - [facility]03    02/06/17  = 27  - HC03   08/25/22    = 34  - [facility]   07/28/23   = 28   Doing better on 24h 02 with bipap at hs > f/u per Sleep NP planned

## 2023-10-08 NOTE — Assessment & Plan Note (Signed)
 Complicated by hbp/ pre-dm  Body mass index is 50.59 kg/m.  -  trending down/ encouraged! Lab Results  Component Value Date   TSH 2.810 09/24/2022      Contributing to doe and risk of GERD/ dvt/ pe >>>   reviewed the need and the process to achieve and maintain neg calorie balance > defer f/u primary care including intermittently monitoring thyroid status           Each maintenance medication was reviewed in detail including emphasizing most importantly the difference between maintenance and prns and under what circumstances the prns are to be triggered using an action plan format where appropriate.  Total time for H and P, chart review, counseling, reviewing neb/ 02 /pulse ox  device(s) and generating customized AVS unique to this office visit / same day charting = 23 min

## 2023-11-23 ENCOUNTER — Ambulatory Visit (HOSPITAL_COMMUNITY)
Admission: RE | Admit: 2023-11-23 | Discharge: 2023-11-23 | Disposition: A | Discharge status: Home / Self Care | Source: Ambulatory Visit | Attending: Hematology | Admitting: Hematology

## 2023-11-23 DIAGNOSIS — C3490 Malignant neoplasm of unspecified part of unspecified bronchus or lung: Secondary | ICD-10-CM | POA: Insufficient documentation

## 2023-11-23 DIAGNOSIS — I700 Atherosclerosis of aorta: Secondary | ICD-10-CM | POA: Diagnosis not present

## 2023-11-23 DIAGNOSIS — I2723 Pulmonary hypertension due to lung diseases and hypoxia: Secondary | ICD-10-CM | POA: Diagnosis not present

## 2023-11-23 DIAGNOSIS — R222 Localized swelling, mass and lump, trunk: Secondary | ICD-10-CM | POA: Diagnosis not present

## 2023-11-26 ENCOUNTER — Ambulatory Visit: Payer: Self-pay | Admitting: Nurse Practitioner

## 2023-11-30 ENCOUNTER — Encounter: Payer: Self-pay | Admitting: Nurse Practitioner

## 2023-11-30 ENCOUNTER — Telehealth: Payer: Self-pay | Admitting: Nurse Practitioner

## 2023-11-30 VITALS — BP 116/54 | HR 69 | Temp 97.0°F | Wt 301.0 lb

## 2023-11-30 DIAGNOSIS — I5032 Chronic diastolic (congestive) heart failure: Secondary | ICD-10-CM | POA: Diagnosis not present

## 2023-11-30 DIAGNOSIS — C3492 Malignant neoplasm of unspecified part of left bronchus or lung: Secondary | ICD-10-CM

## 2023-11-30 DIAGNOSIS — G4733 Obstructive sleep apnea (adult) (pediatric): Secondary | ICD-10-CM

## 2023-11-30 DIAGNOSIS — N1832 Chronic kidney disease, stage 3b: Secondary | ICD-10-CM

## 2023-11-30 DIAGNOSIS — D638 Anemia in other chronic diseases classified elsewhere: Secondary | ICD-10-CM

## 2023-11-30 DIAGNOSIS — E785 Hyperlipidemia, unspecified: Secondary | ICD-10-CM

## 2023-11-30 DIAGNOSIS — J45909 Unspecified asthma, uncomplicated: Secondary | ICD-10-CM

## 2023-11-30 NOTE — Assessment & Plan Note (Signed)
 Followed by oncology has upcoming appointment with them, had a CT scan done recently

## 2023-11-30 NOTE — Patient Instructions (Signed)

## 2023-11-30 NOTE — Assessment & Plan Note (Addendum)
 Continue calcitriol  0.25 mcg daily Avoid NSAIDs and other nephrotoxic agents Thank you to maintain close follow-up with nephrology Lab Results  Component Value Date   NA 144 07/28/2023   K 4.2 07/28/2023   CO2 28 07/28/2023   GLUCOSE 83 07/28/2023   BUN 34 (H) 07/28/2023   CREATININE 1.52 (H) 07/28/2023   CALCIUM  9.2 07/28/2023   GFR 29.92 (L) 08/27/2022   EGFR 38.0 09/08/2023   GFRNONAA 45 (L) 03/03/2023

## 2023-11-30 NOTE — Assessment & Plan Note (Addendum)
 Followed by nephrologist On ferrous sulfate  325 mg daily, folic acid  1 mg daily Continue current medication

## 2023-11-30 NOTE — Progress Notes (Signed)
 Virtual Visit via Telephone Note  I connected with Pamela [name] @ on 11/30/23 at 11:00 AM EDT by telephone and verified that I am speaking with the correct person using two identifiers.  Unable to connect with the patient via video after multiple attempts.  I spent 10 minutes on this telehealth encounter  Location: Patient: home Provider: office   I discussed the limitations, risks, security and privacy concerns of performing an evaluation and management service by telephone and the availability of in person appointments. I also discussed with the patient that there may be a patient responsible charge related to this service. The patient expressed understanding and agreed to proceed.   History of Present Illness: [name]  has a past medical history of Arthritis, Asthma, Cancer (HCC), CHF (congestive heart failure) (HCC), Congenital single kidney, COPD (chronic obstructive pulmonary disease) (HCC), Gout, Hypertension, Lung cancer (HCC) (2012), Mass of chest wall, right, Oxygen  deficiency, Sarcoidosis, Sickle cell trait (HCC), and Sleep apnea.  Patient present for follow-up for her chronic medical conditions.  She was in the office earlier but had to leave because her transportation had arrived.  She reports feeling well generally and taking all her current medications daily as needed Continues on supplemental oxygen  via nasal cannula uses a BIPAP machine at night Stated that sometimes last week she was feeling dizzy but the dizziness has now resolved  Sees nephrologist in August and oncologist next month had her MRI done   Observations/Objective:   Assessment and Plan: Stage 3b chronic kidney disease (HCC) Continue calcitriol  0.25 mcg daily Avoid NSAIDs and other nephrotoxic agents Thank you to maintain close follow-up with nephrology Lab Results  Component Value Date   NA 144 07/28/2023   K 4.2 07/28/2023   CO2 28 07/28/2023   GLUCOSE 83 07/28/2023   BUN 34 (H) 07/28/2023    CREATININE 1.52 (H) 07/28/2023   CALCIUM  9.2 07/28/2023   GFR 29.92 (L) 08/27/2022   EGFR 38.0 09/08/2023   GFRNONAA 45 (L) 03/03/2023     Obstructive sleep apnea Continue BiPAP at night  Malignant neoplasm of lung (HCC) Followed by oncology has upcoming appointment with them, had a CT scan done recently  Anemia of chronic disease Followed by nephrologist On ferrous sulfate  325 mg daily, folic acid  1 mg daily Continue current medication  Asthma, chronic Continue budesonide  and albuterol  nebulizer twice daily, albuterol  inhaler as needed, singular 10 mg at bedtime, Zyrtec  10 mg daily on oxygen  3 L for 10 minutes Encouraged to maintain close follow-up with pulmonology as  Chronic diastolic CHF (congestive heart failure) (HCC) continue furosemide  40 mg daily  Dyslipidemia Currently well-controlled on Zetia  10 mg daily and atorvastatin  40 mg daily Will check lipid panel at next visit Lab Results  Component Value Date   CHOL 113 06/30/2023   HDL 52 06/30/2023   LDLCALC 46 06/30/2023   TRIG 70 06/30/2023   CHOLHDL 2.2 06/30/2023      Follow Up Instructions:    I discussed the assessment and treatment plan with the patient. The patient was provided an opportunity to ask questions and all were answered. The patient agreed with the plan and demonstrated an understanding of the instructions.   The patient was advised to call back or seek an in-person evaluation if the symptoms worsen or if the condition fails to improve as anticipated.

## 2023-11-30 NOTE — Assessment & Plan Note (Signed)
 continue furosemide  40 mg daily

## 2023-11-30 NOTE — Assessment & Plan Note (Signed)
 Currently well-controlled on Zetia  10 mg daily and atorvastatin  40 mg daily Will check lipid panel at next visit Lab Results  Component Value Date   CHOL 113 06/30/2023   HDL 52 06/30/2023   LDLCALC 46 06/30/2023   TRIG 70 06/30/2023   CHOLHDL 2.2 06/30/2023

## 2023-11-30 NOTE — Assessment & Plan Note (Signed)
 Continue budesonide  and albuterol  nebulizer twice daily, albuterol  inhaler as needed, singular 10 mg at bedtime, Zyrtec  10 mg daily on oxygen  3 L for 10 minutes Encouraged to maintain close follow-up with pulmonology as

## 2023-11-30 NOTE — Assessment & Plan Note (Addendum)
 Continue BiPAP at night

## 2023-12-02 ENCOUNTER — Other Ambulatory Visit: Payer: Self-pay | Admitting: Nurse Practitioner

## 2023-12-06 ENCOUNTER — Other Ambulatory Visit: Payer: Self-pay | Admitting: Internal Medicine

## 2023-12-11 LAB — GLUCOSE, POCT (MANUAL RESULT ENTRY): POC Glucose: 123 mg/dL — AB (ref 70–99)

## 2023-12-14 ENCOUNTER — Ambulatory Visit (INDEPENDENT_AMBULATORY_CARE_PROVIDER_SITE_OTHER)

## 2023-12-14 ENCOUNTER — Ambulatory Visit: Payer: Medicare HMO | Admitting: Podiatry

## 2023-12-14 DIAGNOSIS — M79675 Pain in left toe(s): Secondary | ICD-10-CM

## 2023-12-14 DIAGNOSIS — M7752 Other enthesopathy of left foot: Secondary | ICD-10-CM

## 2023-12-14 DIAGNOSIS — M79674 Pain in right toe(s): Secondary | ICD-10-CM | POA: Diagnosis not present

## 2023-12-14 DIAGNOSIS — B351 Tinea unguium: Secondary | ICD-10-CM

## 2023-12-14 DIAGNOSIS — Q828 Other specified congenital malformations of skin: Secondary | ICD-10-CM

## 2023-12-14 NOTE — Progress Notes (Unsigned)
 Subjective: Chief Complaint  Patient presents with   RFC+    RM#12 RFC patient has no other concerns at this time.     73 y.o. returns the office today for painful, elongated, thickened toenails which she cannot trim herself.    She also has secondary concerns of pain to the left ankle going to the anterior lateral aspect.  She does not report any recent injuries.  She has been trying to home stretching, rehab exercises.  Paseda, [name], FNP Last seen: 11/20/2023   Objective: AAO 3, NAD DP/PT pulses palpable, CRT less than 3 seconds Chronic lower extremity edema. Nails hypertrophic, dystrophic, elongated, brittle, discolored 10. There is tenderness overlying the nails 1-5 bilaterally. There is no surrounding erythema or drainage along the nail sites. Hyperkeratotic lesions noted along the lateral aspect of the right heel without any underlying ulceration, drainage or any signs of infection. No open lesions bilaterally.  There is tenderness palpation of the anterior lateral aspect the left ankle.  There is chronic edema present to bilateral lower extremities.  Is no area pinpoint tenderness identified at this time.  There is no erythema or warmth. No pain with calf compression, swelling, warmth, erythema.  Assessment: Patient presents with symptomatic onychomycosis, hyperkeratotic lesion; capsulitis left ankle  Plan: Symptomatic onychomycosis -Nails sharply debrided 10 without complication/bleeding.  Hyperkeratotic lesion -Debrided hyperkeratotic lesion x 1 without any complications or bleeding as a courtesy as it was quite minimal today  Capsulitis left ankle -X-rays were obtained and reviewed.  Multiple views obtained.  No evidence of acute fracture.  Joint space maintained.  Significant arthritic changes present to talonavicular joint. -She wishes to proceed with a steroid injection today.  Clean skin with Betadine, alcohol.  Mixture of 1 cc Kenalog  10, 0.5 cc of  Marcaine plain, 0.5 cc of lidocaine  plain was infiltrated into the anterior lateral aspect the left ankle without complications.  Postinjection care discussed.  Tolerated well.    Return in about 3 months (around 03/15/2024).  Charity Conch DPM

## 2023-12-14 NOTE — Patient Instructions (Signed)
 While at your visit today you received a steroid injection in your foot or ankle to help with your pain. Along with having the steroid medication there is some "numbing" medication in the shot that you received. Due to this you may notice some numbness to the area for the next couple of hours.  ? ?I would recommend limiting activity for the next few days to help the steroid injection take affect.  ?  ?The actually benefit from the steroid injection may take up to 2-7 days to see a difference. You may actually experience a small (as in 10%) INCREASE in pain in the first 24 hours---that is common. It would be best if you can ice the area today and take anti-inflammatory medications (such as Ibuprofen, Motrin, or Aleve) if you are able to take these medications. If you were prescribed another medication to help with the pain go ahead and start that medication today  ?  ?Things to watch out for that you should contact us or a health care provider urgently would include: ?1. Unusual (as in more than 10%) increase in pain ?2. New fever > 101.5 ?3. New swelling or redness of the injected area.  ?4. Streaking of red lines around the area injected. ? ?If you have any questions or concerns about this, please give our office a call at [phone].  ? ? ?

## 2023-12-17 ENCOUNTER — Other Ambulatory Visit: Payer: Self-pay | Admitting: Nurse Practitioner

## 2023-12-17 ENCOUNTER — Encounter: Payer: Self-pay | Admitting: *Deleted

## 2023-12-17 NOTE — Congregational Nurse Program (Signed)
  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 Patient notes it was consistent with sarcoidosis but actual pathology results not available.   Oxygen  deficiency    Sarcoidosis    Sickle cell trait (HCC)    Sleep apnea     Encounter Details: Attended a community screening event

## 2023-12-31 ENCOUNTER — Other Ambulatory Visit: Payer: Medicare HMO

## 2023-12-31 ENCOUNTER — Ambulatory Visit: Payer: Medicare HMO | Admitting: Hematology

## 2024-01-06 ENCOUNTER — Inpatient Hospital Stay: Payer: Self-pay | Attending: Hematology | Admitting: Hematology

## 2024-01-06 VITALS — BP 132/70 | HR 65 | Temp 97.0°F | Resp 18 | Wt 298.0 lb

## 2024-01-06 DIAGNOSIS — D869 Sarcoidosis, unspecified: Secondary | ICD-10-CM | POA: Insufficient documentation

## 2024-01-06 DIAGNOSIS — Z85118 Personal history of other malignant neoplasm of bronchus and lung: Secondary | ICD-10-CM | POA: Insufficient documentation

## 2024-01-06 DIAGNOSIS — D573 Sickle-cell trait: Secondary | ICD-10-CM | POA: Diagnosis not present

## 2024-01-06 DIAGNOSIS — C3490 Malignant neoplasm of unspecified part of unspecified bronchus or lung: Secondary | ICD-10-CM

## 2024-01-06 DIAGNOSIS — Z923 Personal history of irradiation: Secondary | ICD-10-CM | POA: Insufficient documentation

## 2024-01-06 DIAGNOSIS — Z800 Family history of malignant neoplasm of digestive organs: Secondary | ICD-10-CM | POA: Insufficient documentation

## 2024-01-06 DIAGNOSIS — R918 Other nonspecific abnormal finding of lung field: Secondary | ICD-10-CM | POA: Insufficient documentation

## 2024-01-06 DIAGNOSIS — Z8049 Family history of malignant neoplasm of other genital organs: Secondary | ICD-10-CM | POA: Insufficient documentation

## 2024-01-06 DIAGNOSIS — Z807 Family history of other malignant neoplasms of lymphoid, hematopoietic and related tissues: Secondary | ICD-10-CM | POA: Insufficient documentation

## 2024-01-06 NOTE — Progress Notes (Signed)
 HEMATOLOGY/ONCOLOGY CLINIC NOTE  Date of Service: 01/13/24  Patient Care Team: Paseda, [name], FNP as PCP - General (Nurse Practitioner) Santo Stanly LABOR, MD as PCP - Cardiology (Cardiology) Darlean Ozell NOVAK, MD as Consulting Physician (Pulmonary Disease) Parrett, Madelin RAMAN, NP as Nurse Practitioner (Pulmonary Disease)  CHIEF COMPLAINTS/PURPOSE OF CONSULTATION:  Interval follow-up for surveillance of lung cancer   DIAGNOSIS 1. Right upper lobe T1b, N0, M0 primary lung adenocarcinoma diagnosed in December 2012. She was deemed not to be a surgical candidate. Treated by radiation oncology with SBRT at [facility]. Completed treatment in March 2013 and has been monitored since with no evidence of recurrence based on PET/CT scan done on 05/20/2015.  2.  Right anterior chest wall slowly growing masslike lesion in the right pectoralis minor muscle. Unclear etiology. Biopsy was done on 12/14/2013 at the [facility] [place] . FNA showed skeletal muscles, macrophages and spindle cells some with cytologic atypia. Needle core biopsy showed soft tissue and skeletal muscle with extensive necrosis; rare spindle cells with cytologic atypia. Treatment effect causing reactive atypia cannot be ruled out. Clinical correlation was advised to ensure the sample is representative. PET/CT scan done here on 05/20/2015 shows no change in metabolic activity or size of the medial right chest wall mass suggesting benign etiology.  INTERVAL HISTORY  [name] is a 73 y.o. female here for her 1 year follow-up for continued surveillance of lung cancer. Patient was last seen by me on 12/18/2022 and reported stable leg swelling. She noted being hospitalized for COPD exacerbation in September 2023.  She presents with a walker during today's visit. She has been doing well overall since her last clinical visit.   Patient reports that she has been losing weight without use of weight-loss medication.  Her weight loss goal had been to reach under 300 pounds in order to have breast reduction surgery. She is noted to weigh 298 pounds in clinic today.   Patient reports that her breathing has been stable.   She reports that she is intermittently using oxygen .   Patient generally does not use oxygen  at home, unless her oxygen  levels drop too much with walking. She notes that when she is needing to use it, she uses 3L. Patent does use oxygen  outside of the home.   Patient notes that she has a new PCP and she will be seeing her PCP tomorrow.    She is unsure who her surgeon will be for her upcoming breast reduction surgery at this time.   Her pulmonologist is Dr. Darlean, and she will see him next on 01/10/2024.   She denies any abdominal pain at this time.   Patient will be traveling to [place]  next month for a family reunion. She will travel to [place] in November 2025.     MEDICAL HISTORY:  Past Medical History:  Diagnosis Date   Arthritis    Asthma    Cancer (HCC)    lung, adenocarcinoma right lung 2012   CHF (congestive heart failure) (HCC)    Preserved EF   Congenital single kidney    With chronic kidney disease   COPD (chronic obstructive pulmonary disease) (HCC)    Gout    Hypertension    Lung cancer (HCC) 2012   Right upper lobe lung adenocarcinoma diagnosed with needle biopsy treated by SBRT finished treatment April 2013 has been monitored since   Mass of chest wall, right    Right chest wall mass 7.3 cm biopsy on  12/13/2013. Patient notes it was consistent with sarcoidosis but actual pathology results not available.   Oxygen  deficiency    Sarcoidosis    Sickle cell trait (HCC)    Sleep apnea    CKD (US  11/21/2015 showed no urinary tract abnormalities.) Gout on Uloric  - tea as a trigger.  SURGICAL HISTORY: Past Surgical History:  Procedure Laterality Date   LOOP RECORDER INSERTION N/A 09/08/2018   Procedure: LOOP RECORDER INSERTION;  Surgeon: Waddell Danelle ORN, MD;   Location: [facility] INVASIVE CV LAB;  Service: Cardiovascular;  Laterality: N/A;   LUNG BIOPSY     TEE WITHOUT CARDIOVERSION N/A 09/08/2018   Procedure: TRANSESOPHAGEAL ECHOCARDIOGRAM (TEE);  Surgeon: Delford Maude BROCKS, MD;  Location: [facility] ENDOSCOPY;  Service: Cardiovascular;  Laterality: N/A;  with loop   TUBAL LIGATION     VEIN LIGATION AND STRIPPING      SOCIAL HISTORY: Social History   Socioeconomic History   Marital status: Divorced    Spouse name: Not on file   Number of children: 2   Years of education: Not on file   Highest education level: Not on file  Occupational History   Not on file  Tobacco Use   Smoking status: Never   Smokeless tobacco: Never  Vaping Use   Vaping status: Never Used  Substance and Sexual Activity   Alcohol use: Not on file   Drug use: No   Sexual activity: Not Currently  Other Topics Concern   Not on file  Social History Narrative   Lives with son.  One son is deceased.     Social Drivers of Corporate investment banker Strain: Low Risk  (07/22/2023)   Overall Financial Resource Strain (CARDIA)    Difficulty of Paying Living Expenses: Not hard at all  Food Insecurity: No Food Insecurity (07/22/2023)   Hunger Vital Sign    Worried About Running Out of Food in the Last Year: Never true    Ran Out of Food in the Last Year: Never true  Transportation Needs: No Transportation Needs (07/22/2023)   PRAPARE - Administrator, Civil Service (Medical): No    Lack of Transportation (Non-Medical): No  Physical Activity: Insufficiently Active (07/22/2023)   Exercise Vital Sign    Days of Exercise per Week: 7 days    Minutes of Exercise per Session: 20 min  Stress: No Stress Concern Present (07/15/2022)   [employer] of Occupational Health - Occupational Stress Questionnaire    Feeling of Stress : Not at all  Social Connections: Moderately Integrated (07/22/2023)   Social Connection and Isolation Panel    Frequency of Communication with Friends and  Family: More than three times a week    Frequency of Social Gatherings with Friends and Family: More than three times a week    Attends Religious Services: More than 4 times per year    Active Member of [employer] or Organizations: Yes    Attends Engineer, structural: More than 4 times per year    Marital Status: Divorced  Intimate Partner Violence: Not At Risk (07/22/2023)   Humiliation, Afraid, Rape, and Kick questionnaire    Fear of Current or Ex-Partner: No    Emotionally Abused: No    Physically Abused: No    Sexually Abused: No    FAMILY HISTORY: Family History  Problem Relation Age of Onset   Hypertension Mother    Renal Disease Mother    High blood pressure Mother    Heart disease Mother  Cancer Father        stomach   Heart disease Father        No details   Cervical cancer Sister    Diabetes Sister    Multiple myeloma Sister    Cancer Brother    Diabetes Brother     ALLERGIES:  is allergic to sulfa  antibiotics.  MEDICATIONS:  Current Outpatient Medications  Medication Sig Dispense Refill   acetaminophen  (TYLENOL ) 500 MG tablet Take 1,000 mg by mouth every 6 (six) hours as needed for mild pain.     albuterol  (PROAIR  HFA) 108 (90 Base) MCG/ACT inhaler Inhale 2 puffs into the lungs every 4 (four) hours as needed for wheezing or shortness of breath. 8 g 5   albuterol  (PROVENTIL ) (2.5 MG/3ML) 0.083% nebulizer solution INHALE 1 VIAL IN NEBULIZER EVERY 6 HOURS AS NEEDED FOR WHEEZING FOR SHORTNESS OF BREATH 300 mL 5   aspirin  EC 81 MG tablet Take 1 tablet (81 mg total) by mouth daily. Swallow whole. 30 tablet 11   atorvastatin  (LIPITOR) 40 MG tablet TAKE 1 TABLET EVERY DAY 90 tablet 0   B COMPLEX, FOLIC ACID , PO Take 1 tablet by mouth daily.     B Complex-C (B-COMPLEX WITH VITAMIN C) tablet Take 1 tablet by mouth daily. 120 tablet 0   budesonide  (PULMICORT ) 0.25 MG/2ML nebulizer solution INHALE THE CONTENTS OF 1 VIAL VIA NEBULIZER TWICE DAILY AS DIRECTED WITH  ALBUTEROL  360 mL 3   calcitRIOL  (ROCALTROL ) 0.25 MCG capsule TAKE 1 CAPSULE EVERY DAY 90 capsule 3   cetirizine  (ZYRTEC ) 10 MG chewable tablet Chew 10 mg by mouth daily.     Cholecalciferol (VITAMIN D3) 25 MCG (1000 UT) CAPS Take 1 capsule (1,000 Units total) by mouth daily. 60 capsule 2   dextromethorphan  (DELSYM ) 30 MG/5ML liquid Take 5 mLs (30 mg total) by mouth 2 (two) times daily. (Patient taking differently: Take 30 mg by mouth as needed for cough.) 89 mL 0   diclofenac  Sodium (VOLTAREN ) 1 % GEL Apply 2 g topically 4 (four) times daily. 100 g 0   docusate sodium  (STOOL SOFTENER) 100 MG capsule Take 100 mg by mouth daily.     ezetimibe  (ZETIA ) 10 MG tablet Take 1 tablet (10 mg total) by mouth daily. 90 tablet 3   famotidine  (PEPCID ) 20 MG tablet TAKE 1 TABLET AFTER SUPPER 90 tablet 3   febuxostat  (ULORIC ) 40 MG tablet TAKE 1 TABLET EVERY DAY 90 tablet 3   folic acid  (FOLVITE ) 1 MG tablet Take 1 tablet (1 mg total) by mouth daily. (Patient not taking: Reported on 01/10/2024) 120 tablet 0   furosemide  (LASIX ) 40 MG tablet TAKE 1 TABLET EVERY DAY 90 tablet 3   hydrOXYzine  (ATARAX ) 25 MG tablet Take 1 tablet (25 mg total) by mouth 3 (three) times daily as needed for itching. 30 tablet 0   Incontinence Supply Disposable (DEPEND UNDERWEAR LARGE/XL) MISC 1 each by Does not apply route 2 times daily at 12 noon and 4 pm. 17 each 12   montelukast  (SINGULAIR ) 10 MG tablet TAKE 1 TABLET AT BEDTIME 90 tablet 3   pantoprazole  (PROTONIX ) 40 MG tablet TAKE 1 TABLET EVERY DAY 30 TO 60 MINUTES BEFORE FIRST MEAL OF THE DAY 90 tablet 3   No current facility-administered medications for this visit.    REVIEW OF SYSTEMS:    10 Point review of Systems was done is negative except as noted above.   PHYSICAL EXAMINATION:  ECOG PERFORMANCE STATUS: 2-3  . Vitals:  01/06/24 1119  BP: 132/70  Pulse: 65  Resp: 18  Temp: (!) 97 F (36.1 C)  SpO2: 96%    Filed Weights   01/06/24 1119  Weight: 298 lb  (135.2 kg)    .Body mass index is 49.59 kg/m.   Wt Readings from Last 3 Encounters:  01/10/24 (!) 301 lb 9.6 oz (136.8 kg)  01/07/24 298 lb (135.2 kg)  01/06/24 298 lb (135.2 kg)    GENERAL:alert, in no acute distress and comfortable SKIN: no acute rashes, no significant lesions EYES: conjunctiva are pink and non-injected, sclera anicteric OROPHARYNX: MMM, no exudates, no oropharyngeal erythema or ulceration NECK: supple, no JVD LYMPH:  no palpable lymphadenopathy in the cervical, axillary or inguinal regions LUNGS: clear to auscultation b/l with normal respiratory effort HEART: regular rate & rhythm ABDOMEN:  normoactive bowel sounds , non tender, not distended. Extremity: no pedal edema PSYCH: alert & oriented x 3 with fluent speech NEURO: no focal motor/sensory deficits   LABORATORY DATA:  I have reviewed the data as listed     Latest Ref Rng & Units 01/07/2024   12:03 PM 03/01/2023    3:11 AM 02/28/2023    2:51 AM  CBC  WBC 3.4 - 10.8 x10E3/uL 4.5  6.2  5.5   Hemoglobin 11.1 - 15.9 g/dL 89.5  88.7  88.7   Hematocrit 34.0 - 46.6 % 35.1  38.0  38.3   Platelets 150 - 450 x10E3/uL 120  143  148    . CBC    Component Value Date/Time   WBC 4.5 01/07/2024 1203   WBC 6.2 03/01/2023 0311   RBC 4.59 01/07/2024 1203   RBC 5.20 (H) 03/01/2023 0311   HGB 10.4 (L) 01/07/2024 1203   HGB 11.8 05/31/2017 1050   HCT 35.1 01/07/2024 1203   HCT 37.5 05/31/2017 1050   PLT 120 (L) 01/07/2024 1203   MCV 77 (L) 01/07/2024 1203   MCV 71.2 (L) 05/31/2017 1050   MCH 22.7 (L) 01/07/2024 1203   MCH 21.5 (L) 03/01/2023 0311   MCHC 29.6 (L) 01/07/2024 1203   MCHC 29.5 (L) 03/01/2023 0311   RDW 13.3 01/07/2024 1203   RDW 15.1 (H) 05/31/2017 1050   LYMPHSABS 1.4 12/18/2022 0920   LYMPHSABS 1.0 08/29/2019 0941   LYMPHSABS 1.2 05/31/2017 1050   MONOABS 0.5 12/18/2022 0920   MONOABS 0.4 05/31/2017 1050   EOSABS 1.5 (H) 12/18/2022 0920   EOSABS 1.0 (H) 08/29/2019 0941   BASOSABS 0.1  12/18/2022 0920   BASOSABS 0.0 08/29/2019 0941   BASOSABS 0.0 05/31/2017 1050        Latest Ref Rng & Units 01/07/2024   12:03 PM 07/28/2023   10:39 AM 06/30/2023   11:30 AM  CMP  Glucose 70 - 99 mg/dL 85  83    BUN 8 - 27 mg/dL 28  34    Creatinine 9.42 - 1.00 mg/dL 8.53  8.47    Sodium 865 - 144 mmol/L 141  144    Potassium 3.5 - 5.2 mmol/L 3.9  4.2    Chloride 96 - 106 mmol/L 102  103    CO2 20 - 29 mmol/L 26  28    Calcium  8.7 - 10.3 mg/dL 8.5  9.2    Total Protein 6.0 - 8.5 g/dL 6.2  6.4    Total Bilirubin 0.0 - 1.2 mg/dL 0.5  0.5    Alkaline Phos 44 - 121 IU/L 134  148    AST 0 - 40 IU/L 23  23    ALT 0 - 32 IU/L 16  12  12     . Lab Results  Component Value Date   IRON  73 01/07/2024   TIBC 202 (L) 01/07/2024   IRONPCTSAT 36 01/07/2024   (Iron  and TIBC)  Lab Results  Component Value Date   FERRITIN 542 (H) 01/07/2024    RADIOGRAPHIC STUDIES: I have personally reviewed the radiological images as listed and agreed with the findings in the report.  CLINICAL DATA:  Non-small cell lung cancer surveillance. * Tracking Code: BO *   EXAM: CT CHEST WITHOUT CONTRAST   TECHNIQUE: Multidetector CT imaging of the chest was performed following the standard protocol without IV contrast.   RADIATION DOSE REDUCTION: This exam was performed according to the departmental dose-optimization program which includes automated exposure control, adjustment of the mA and/or kV according to patient size and/or use of iterative reconstruction technique.   COMPARISON:  Multiple prior imaging studies. The most recent CT scan is 11/30/2022   FINDINGS: Cardiovascular: The heart is normal in size and stable. No pericardial effusion. The aorta is normal in caliber. Stable tortuosity and calcification. No obvious coronary artery calcifications. Mild stable enlargement the pulmonary arteries which would suggest pulmonary hypertension.   Mediastinum/Nodes: Small scattered mediastinal  and hilar lymph nodes but no mass or overt adenopathy. The esophagus is unremarkable. Small hiatal hernia.   Lungs/Pleura: Stable radiation changes involving the right upper lobe. No findings suspicious for recurrent tumor. No new pulmonary lesions or pulmonary nodules. No acute overlying pulmonary process. Stable small right upper lobe calcified granuloma. No pleural effusions or pleural lesions.   Upper Abdomen: No significant upper abdominal findings. No hepatic or adrenal gland lesions. No upper abdominal adenopathy.   Musculoskeletal: Stable soft tissue mass involving the right pectoralis minor muscle. This measures approximately 5.2 x 2.7 cm but is unchanged and has been previously biopsied in 2015. No significant bony findings. Stable age related osteoporosis but no worrisome bone lesions.   IMPRESSION: 1. Stable radiation changes involving the right upper lobe. No findings suspicious for recurrent tumor. 2. No mediastinal or hilar mass or adenopathy. 3. Stable soft tissue mass involving the right pectoralis minor muscle. This has been previously biopsied in 2015. 4. No findings for upper abdominal metastatic disease. 5. Aortic atherosclerosis.   Aortic Atherosclerosis (ICD10-I70.0).     Electronically Signed   By: MYRTIS Stammer M.D.   On: 12/04/2023 13:09    ASSESSMENT & PLAN:   73 y.o. African-American female with   #1 History of right upper lobe T1b, N0, M0 primary lung adenocarcinoma most in December 2012. She was deemed not to be a surgical candidate. Treated by radiation oncology with SBRT at [facility]. Completed treatment in March 2013 and has been monitored since with no evidence of recurrence. no evidence of recurrence based on PET/CT scan done on 05/20/2015. On clinical visit today the patient has no new change in her breathing and no new focal symptoms.  Hemoglobin is stable.   CT chest 05/26/2016 shows no evidence of local recurrence of cancer  or metastatic disease. CT Chest, 05/31/2017 shows no evidence of local recurrence of cancer or metastatic disease.   #2 Right anterior chest wall slowly growing masslike lesion in the right pectoralis minor muscle. Unclear etiology. FNA showed skeletal muscles, macrophages and spindle cells some with cytologic atypia. Needle core biopsy showed soft tissue and skeletal muscle with extensive necrosis; rare spindle cells with cytologic atypia. Treatment effect causing reactive atypia  cannot be ruled out.  PET/CT scan done here on 05/20/2015 shows no change in metabolic activity or size of the medial right chest wall mass suggesting benign etiology. Could certainly be related to her sarcoidosis . Previously discussed with the patient the option of biopsying the mass for a more definitive diagnosis versus monitoring it. She chooses to monitor it at this time given that imaging findings suggest a likely benign etiology that hasnt changed over the last 18 months. CT chest 05/26/2016 shows that this lesion is stable compared to previous imaging. -CT Chest, 05/31/2017 shows that this lesion is stable. No locally recurrent or metastatic disease.   PLAN:   -CT chest scan w/o contrast on 11/23/2023 shows stable findings, including stable radiation changes in the right lung. There is no new tumor or enlarged lymph nodes. There were findings of soft tissues mass involving the right pectoralis minor muscle which was unchanged. There were no concerns for recurrent cancer on CT chest scan findings.  -No indication clinically or radiographically for progression/recurrence of lung cancer. -Continue f/u with PCP, nephrologist, cardiologist, and pulmonologist. -given that she has been cancer-free for several years, discussed option for patient to receive yearly scans with her PCP and see us  as needed. She is inclined to continue following up with us  once a year.  -she shall return to clinic with is in 1 year   #3 history  of extensive sarcoidosis involving lymphadenopathy in the chest, abdomen as well as spleen and possibly liver .previously treated with steroids by Dr. Seleta, pulmonary at [facility] .has recently established care with Dr. Darlean for her pulmonary and sarcoidosis management.   Plan -Continue followup with Dr. Darlean For further management   #4 history of sickle cell trait. Hemoglobin electrophoresis is not available.  Microcytosis likely from chronic disease from her sarcoidosis vs possible alpha thal trait. Ferritin levels WNL  Plan   -No indication for additional iron  replacement at this time.  FOLLOW-UP: CT chest in 48 weeks RTC with Dr Onesimo with labs in 50 weeks  The total time spent in the appointment was 23 minutes* .  All of the patient's questions were answered with apparent satisfaction. The patient knows to call the clinic with any problems, questions or concerns.   Emaline Onesimo MD MS AAHIVMS [facility] Hematology/Oncology Physician [facility]  .*Total Encounter Time as defined by the Centers for Medicare and Medicaid Services includes, in addition to the face-to-face time of a patient visit (documented in the note above) non-face-to-face time: obtaining and reviewing outside history, ordering and reviewing medications, tests or procedures, care coordination (communications with other health care professionals or caregivers) and documentation in the medical record.    I,Mitra Faeizi,acting as a Neurosurgeon for Emaline Onesimo, MD.,have documented all relevant documentation on the behalf of Emaline Onesimo, MD,as directed by  Emaline Onesimo, MD while in the presence of Emaline Onesimo, MD.  .I have reviewed the above documentation for accuracy and completeness, and I agree with the above. .Michaell Grider Kishore Ed Mandich MD

## 2024-01-07 ENCOUNTER — Encounter: Payer: Self-pay | Admitting: Nurse Practitioner

## 2024-01-07 ENCOUNTER — Ambulatory Visit (INDEPENDENT_AMBULATORY_CARE_PROVIDER_SITE_OTHER): Payer: Self-pay | Admitting: Nurse Practitioner

## 2024-01-07 VITALS — BP 109/54 | HR 58 | Temp 97.0°F | Wt 298.0 lb

## 2024-01-07 DIAGNOSIS — N1832 Chronic kidney disease, stage 3b: Secondary | ICD-10-CM

## 2024-01-07 DIAGNOSIS — R32 Unspecified urinary incontinence: Secondary | ICD-10-CM

## 2024-01-07 DIAGNOSIS — D638 Anemia in other chronic diseases classified elsewhere: Secondary | ICD-10-CM | POA: Diagnosis not present

## 2024-01-07 DIAGNOSIS — Z8673 Personal history of transient ischemic attack (TIA), and cerebral infarction without residual deficits: Secondary | ICD-10-CM | POA: Diagnosis not present

## 2024-01-07 DIAGNOSIS — R600 Localized edema: Secondary | ICD-10-CM

## 2024-01-07 DIAGNOSIS — E559 Vitamin D deficiency, unspecified: Secondary | ICD-10-CM | POA: Diagnosis not present

## 2024-01-07 DIAGNOSIS — J9612 Chronic respiratory failure with hypercapnia: Secondary | ICD-10-CM

## 2024-01-07 DIAGNOSIS — I10 Essential (primary) hypertension: Secondary | ICD-10-CM

## 2024-01-07 DIAGNOSIS — J9611 Chronic respiratory failure with hypoxia: Secondary | ICD-10-CM

## 2024-01-07 DIAGNOSIS — I5032 Chronic diastolic (congestive) heart failure: Secondary | ICD-10-CM

## 2024-01-07 NOTE — Assessment & Plan Note (Signed)
 Lab Results  Component Value Date   NA 144 07/28/2023   K 4.2 07/28/2023   CO2 28 07/28/2023   GLUCOSE 83 07/28/2023   BUN 34 (H) 07/28/2023   CREATININE 1.52 (H) 07/28/2023   CALCIUM  9.2 07/28/2023   GFR 29.92 (L) 08/27/2022   EGFR 38.0 09/08/2023   GFRNONAA 45 (L) 03/03/2023  Followed by nephrology Avoid NSAIDs and other nephrotoxic agents Continue calcitriol  0.25 mcg daily

## 2024-01-07 NOTE — Assessment & Plan Note (Signed)
 Continue ferrous sulfate  325 mg daily, folic acid  1 mg daily Encouraged to maintain close follow-up with nephrologist Checking CBC and iron  panel Lab Results  Component Value Date   WBC 6.2 03/01/2023   HGB 11.2 (L) 03/01/2023   HCT 38.0 03/01/2023   MCV 73.1 (L) 03/01/2023   PLT 143 (L) 03/01/2023

## 2024-01-07 NOTE — Assessment & Plan Note (Signed)
 On continuous oxygen  3 L/min via nasal cannula Uses BiPAP at night

## 2024-01-07 NOTE — Patient Instructions (Signed)
 It is important that you exercise regularly at least 30 minutes 5 times a week as tolerated  Think about what you will eat, plan ahead. Choose  clean, green, fresh or frozen over canned, processed or packaged foods which are more sugary, salty and fatty. 70 to 75% of food eaten should be vegetables and fruit. Three meals at set times with snacks allowed between meals, but they must be fruit or vegetables. Aim to eat over a 12 hour period , example 7 am to 7 pm, and STOP after  your last meal of the day. Drink water,generally about 64 ounces per day, no other drink is as healthy. Fruit juice is best enjoyed in a healthy way, by EATING the fruit.  Thanks for choosing [facility] we consider it a privelige to serve you.  Has not noticed smokes and is always okay alright Pamela [name] albumin right back limiting opiates Pamela [name] Pamela [name] is sleeping

## 2024-01-07 NOTE — Assessment & Plan Note (Signed)
 Nonpitting edema bilaterally DASH diet advised elevation and use of compression socks encouraged Continue furosemide  40 mg daily

## 2024-01-07 NOTE — Assessment & Plan Note (Signed)
Continue furosemide 40 mg daily.   

## 2024-01-07 NOTE — Assessment & Plan Note (Signed)
 Controlled on furosemide  40 mg daily, Continue current medication DASH diet and commitment to daily physical activity for a minimum of 30 minutes discussed and encouraged, as a part of hypertension management. The importance of attaining a healthy weight is also discussed.     01/07/2024   11:54 AM 01/06/2024   11:19 AM 12/17/2023    9:38 PM 11/30/2023   11:17 AM 10/08/2023    9:49 AM 09/21/2023    9:46 AM 07/28/2023    9:50 AM  BP/Weight  Systolic BP 109 132 121 116 126 120 95  Diastolic BP 54 70 88 54 74 74 56  Wt. (Lbs) 298 298  301 304 295.4 312  BMI 49.59 kg/m2 49.59 kg/m2  50.09 kg/m2 50.59 kg/m2 49.16 kg/m2 53.55 kg/m2

## 2024-01-07 NOTE — Assessment & Plan Note (Signed)
 Uses depends as needed.

## 2024-01-07 NOTE — Assessment & Plan Note (Signed)
 Patient counseled on low-carb diet Engage in regular moderate exercises as tolerated Wt Readings from Last 3 Encounters:  01/07/24 298 lb (135.2 kg)  01/06/24 298 lb (135.2 kg)  11/30/23 (!) 301 lb (136.5 kg)   Body mass index is 49.59 kg/m.

## 2024-01-07 NOTE — Assessment & Plan Note (Signed)
 Last vitamin D  Lab Results  Component Value Date   VD25OH 8.3 (L) 01/29/2022  Currently not taking vitamin D  supplement Checking vitamin D  levels

## 2024-01-07 NOTE — Progress Notes (Signed)
 Established Patient Office Visit  Subjective:  Patient ID: Pamela [name], female    DOB: 1951-03-10  Age: 73 y.o. MRN: [id number]  CC:  Chief Complaint  Patient presents with   Medical Management of Chronic Issues    HPI Pamela [name] is a 73 y.o. female  has a past medical history of Arthritis, Asthma, Cancer (HCC), CHF (congestive heart failure) (HCC), Congenital single kidney, COPD (chronic obstructive pulmonary disease) (HCC), Gout, Hypertension, Lung cancer (HCC) (2012), Mass of chest wall, right, Oxygen  deficiency, Sarcoidosis, Sickle cell trait (HCC), and Sleep apnea.  Patient presents for follow-up for her chronic medical conditions She denies any new complaints today, states that she is doing well generally Follows up with nephrologist every 6 months, she has also been maintaining close follow-up with pulmonologist, sees cardiologist yearly Continues on continuous oxygen  via nasal cannula 3 L/min       Past Medical History:  Diagnosis Date   Arthritis    Asthma    Cancer (HCC)    lung, adenocarcinoma right lung 2012   CHF (congestive heart failure) (HCC)    Preserved EF   Congenital single kidney    With chronic kidney disease   COPD (chronic obstructive pulmonary disease) (HCC)    Gout    Hypertension    Lung cancer (HCC) 2012   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 Patient notes it was consistent with sarcoidosis but actual pathology results not available.   Oxygen  deficiency    Sarcoidosis    Sickle cell trait (HCC)    Sleep apnea     Past Surgical History:  Procedure Laterality Date   LOOP RECORDER INSERTION N/A 09/08/2018   Procedure: LOOP RECORDER INSERTION;  Surgeon: Tammie Fall, MD;  Location: [facility];  Service: Cardiovascular;  Laterality: N/A;   LUNG  BIOPSY     TEE WITHOUT CARDIOVERSION N/A 09/08/2018   Procedure: TRANSESOPHAGEAL ECHOCARDIOGRAM (TEE);  Surgeon: Loyde Rule, MD;  Location: [facility];  Service: Cardiovascular;  Laterality: N/A;  with loop   TUBAL LIGATION     VEIN LIGATION AND STRIPPING      Family History  Problem Relation Age of Onset   Hypertension Mother    Renal Disease Mother    High blood pressure Mother    Heart disease Mother    Cancer Father        stomach   Heart disease Father        No details   Cervical cancer Sister    Diabetes Sister    Multiple myeloma Sister    Cancer Brother    Diabetes Brother     Social History   Socioeconomic History   Marital status: Divorced    Spouse name: Not on file   Number of children: 2   Years of education: Not on file   Highest education level: Not on file  Occupational History   Not on file  Tobacco Use   Smoking status: Never   Smokeless tobacco: Never  Vaping Use   Vaping status: Never Used  Substance and Sexual Activity   Alcohol use: Not on file   Drug use: No   Sexual activity: Not Currently  Other Topics Concern   Not on file  Social History Narrative   Lives with son.  One son is deceased.     Social Drivers of Corporate investment banker Strain: Low Risk  (07/22/2023)   Overall Financial Resource Strain (CARDIA)    Difficulty of Paying Living Expenses: Not hard at all  Food Insecurity: No Food Insecurity (07/22/2023)   Hunger Vital Sign    Worried About Running Out of Food in the Last Year: Never true    Ran Out of Food in the Last Year: Never true  Transportation Needs: No Transportation Needs (07/22/2023)   PRAPARE - Administrator, Civil Service (Medical): No    Lack of Transportation (Non-Medical): No  Physical Activity: Insufficiently Active (07/22/2023)   Exercise Vital Sign    Days of Exercise per Week: 7 days    Minutes of Exercise per Session: 20 min  Stress: No Stress Concern Present (07/15/2022)   [employer] of Occupational Health - Occupational Stress Questionnaire    Feeling of Stress : Not at all  Social Connections: Moderately Integrated (07/22/2023)   Social Connection and Isolation Panel    Frequency of Communication with Friends and Family: More than three times a week    Frequency of Social Gatherings with Friends and Family: More than three times a week    Attends Religious Services: More than 4 times per year    Active Member of [employer] or Organizations: Yes    Attends Engineer, structural: More than 4 times per year    Marital Status: Divorced  Intimate Partner Violence: Not At Risk (07/22/2023)   Humiliation, Afraid, Rape, and Kick questionnaire    Fear of Current or Ex-Partner: No    Emotionally Abused: No    Physically Abused: No    Sexually Abused: No    Outpatient Medications Prior to Visit  Medication Sig Dispense Refill   acetaminophen  (TYLENOL ) 500 MG tablet Take 1,000 mg by mouth every 6 (six) hours as needed for mild pain.     albuterol  (PROAIR  HFA) 108 (90 Base) MCG/ACT inhaler Inhale 2 puffs into the lungs every 4 (four) hours as needed for wheezing or shortness of breath. 8 g 5   albuterol  (PROVENTIL ) (2.5 MG/3ML) 0.083% nebulizer solution INHALE 1 VIAL IN NEBULIZER EVERY 6 HOURS AS NEEDED FOR WHEEZING FOR SHORTNESS OF BREATH 300 mL 0   aspirin  EC 81 MG tablet Take 1 tablet (81 mg total) by mouth daily. Swallow whole. 30 tablet 11   atorvastatin  (LIPITOR) 40 MG tablet TAKE 1 TABLET EVERY DAY 90 tablet 0   B Complex-C (B-COMPLEX WITH VITAMIN C) tablet Take 1 tablet by mouth daily. 120 tablet 0   budesonide  (PULMICORT ) 0.25 MG/2ML nebulizer solution INHALE THE CONTENTS OF 1 VIAL VIA NEBULIZER TWICE DAILY AS DIRECTED WITH ALBUTEROL  360 mL 3   calcitRIOL  (ROCALTROL ) 0.25 MCG capsule TAKE 1 CAPSULE EVERY DAY 90 capsule 3   cetirizine  (ZYRTEC ) 10 MG chewable tablet Chew 10 mg by mouth daily.     diclofenac  Sodium (VOLTAREN ) 1 % GEL Apply 2 g topically 4 (four)  times daily. 100 g 0   docusate sodium  (STOOL SOFTENER) 100 MG capsule Take 100 mg by mouth daily.     ezetimibe  (ZETIA ) 10 MG tablet Take 1 tablet (10 mg total) by mouth daily. 90 tablet 3   famotidine  (PEPCID ) 20 MG tablet TAKE 1 TABLET AFTER SUPPER 90 tablet 3   febuxostat  (ULORIC ) 40 MG tablet TAKE 1 TABLET EVERY DAY 90 tablet 3   ferrous sulfate  325 (65 FE) MG tablet Take  1 tablet (325 mg total) by mouth daily with lunch. 30 tablet 0   folic acid  (FOLVITE ) 1 MG tablet Take 1 tablet (1 mg total) by mouth daily. 120 tablet 0   furosemide  (LASIX ) 40 MG tablet TAKE 1 TABLET EVERY DAY 90 tablet 3   hydrOXYzine  (ATARAX ) 25 MG tablet Take 1 tablet (25 mg total) by mouth 3 (three) times daily as needed for itching. 30 tablet 0   Incontinence Supply Disposable (DEPEND UNDERWEAR LARGE/XL) MISC 1 each by Does not apply route 2 times daily at 12 noon and 4 pm. 17 each 12   montelukast  (SINGULAIR ) 10 MG tablet TAKE 1 TABLET AT BEDTIME 90 tablet 3   pantoprazole  (PROTONIX ) 40 MG tablet TAKE 1 TABLET EVERY DAY 30 TO 60 MINUTES BEFORE FIRST MEAL OF THE DAY 90 tablet 3   dextromethorphan  (DELSYM ) 30 MG/5ML liquid Take 5 mLs (30 mg total) by mouth 2 (two) times daily. (Patient not taking: Reported on 01/07/2024) 89 mL 0   No facility-administered medications prior to visit.    Allergies  Allergen Reactions   Sulfa  Antibiotics Hives and Rash    ROS Review of Systems  Constitutional:  Negative for appetite change, chills, fatigue and fever.  HENT:  Negative for congestion, postnasal drip, rhinorrhea and sneezing.   Respiratory:  Negative for cough, shortness of breath and wheezing.   Cardiovascular:  Negative for chest pain, palpitations and leg swelling.  Gastrointestinal:  Negative for abdominal pain, constipation, nausea and vomiting.  Genitourinary:  Negative for difficulty urinating, dysuria, flank pain and frequency.  Musculoskeletal:  Negative for back pain, joint swelling and myalgias.  Skin:   Negative for color change and pallor.  Neurological:  Negative for dizziness, facial asymmetry, weakness, numbness and headaches.  Psychiatric/Behavioral:  Negative for behavioral problems, confusion, self-injury and suicidal ideas.       Objective:    Physical Exam Vitals and nursing note reviewed.  Constitutional:      General: She is not in acute distress.    Appearance: Normal appearance. She is obese. She is not ill-appearing, toxic-appearing or diaphoretic.   Eyes:     General: No scleral icterus.       Right eye: No discharge.        Left eye: No discharge.     Extraocular Movements: Extraocular movements intact.     Conjunctiva/sclera: Conjunctivae normal.    Cardiovascular:     Rate and Rhythm: Normal rate and regular rhythm.     Pulses: Normal pulses.     Heart sounds: Normal heart sounds. No murmur heard.    No friction rub. No gallop.  Pulmonary:     Effort: Pulmonary effort is normal. No respiratory distress.     Breath sounds: Normal breath sounds. No stridor. No wheezing, rhonchi or rales.  Chest:     Chest wall: No tenderness.  Abdominal:     General: There is no distension.     Palpations: Abdomen is soft.     Tenderness: There is no abdominal tenderness. There is no right CVA tenderness, left CVA tenderness or guarding.   Musculoskeletal:        General: No tenderness, deformity or signs of injury.     Right lower leg: Edema present.     Left lower leg: Edema present.   Skin:    General: Skin is warm and dry.     Coloration: Skin is not jaundiced or pale.     Findings: No bruising, erythema or lesion.  Neurological:     Mental Status: She is alert and oriented to person, place, and time.     Motor: No weakness.     Gait: Gait abnormal.     Comments: Uses a walker for ambulation  Psychiatric:        Mood and Affect: Mood normal.        Behavior: Behavior normal.        Thought Content: Thought content normal.        Judgment: Judgment normal.      BP (!) 109/54   Pulse (!) 58   Temp (!) 97 F (36.1 C)   Wt 298 lb (135.2 kg)   SpO2 100%   BMI 49.59 kg/m  Wt Readings from Last 3 Encounters:  01/07/24 298 lb (135.2 kg)  01/06/24 298 lb (135.2 kg)  11/30/23 (!) 301 lb (136.5 kg)    Lab Results  Component Value Date   TSH 2.810 09/24/2022   Lab Results  Component Value Date   WBC 6.2 03/01/2023   HGB 11.2 (L) 03/01/2023   HCT 38.0 03/01/2023   MCV 73.1 (L) 03/01/2023   PLT 143 (L) 03/01/2023   Lab Results  Component Value Date   NA 144 07/28/2023   K 4.2 07/28/2023   CHLORIDE 103 05/31/2017   CO2 28 07/28/2023   GLUCOSE 83 07/28/2023   BUN 34 (H) 07/28/2023   CREATININE 1.52 (H) 07/28/2023   BILITOT 0.5 07/28/2023   ALKPHOS 148 (H) 07/28/2023   AST 23 07/28/2023   ALT 12 07/28/2023   PROT 6.4 07/28/2023   ALBUMIN 3.7 (L) 07/28/2023   CALCIUM  9.2 07/28/2023   ANIONGAP 12 03/03/2023   EGFR 38.0 09/08/2023   GFR 29.92 (L) 08/27/2022   Lab Results  Component Value Date   CHOL 113 06/30/2023   Lab Results  Component Value Date   HDL 52 06/30/2023   Lab Results  Component Value Date   LDLCALC 46 06/30/2023   Lab Results  Component Value Date   TRIG 70 06/30/2023   Lab Results  Component Value Date   CHOLHDL 2.2 06/30/2023   Lab Results  Component Value Date   HGBA1C 5.6 09/24/2022      Assessment & Plan:   Problem List Items Addressed This Visit       Cardiovascular and Mediastinum   Chronic diastolic CHF (congestive heart failure) (HCC) (Chronic)   Continue furosemide  40 mg daily      Essential hypertension   Controlled on furosemide  40 mg daily, Continue current medication DASH diet and commitment to daily physical activity for a minimum of 30 minutes discussed and encouraged, as a part of hypertension management. The importance of attaining a healthy weight is also discussed.     01/07/2024   11:54 AM 01/06/2024   11:19 AM 12/17/2023    9:38 PM 11/30/2023   11:17 AM  10/08/2023    9:49 AM 09/21/2023    9:46 AM 07/28/2023    9:50 AM  BP/Weight  Systolic BP 109 132 121 116 126 120 95  Diastolic BP 54 70 88 54 74 74 56  Wt. 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 (Chronic)   Lab Results  Component Value Date   NA 144 07/28/2023   K 4.2 07/28/2023   CO2 28 07/28/2023   GLUCOSE 83 07/28/2023   BUN 34 (H) 07/28/2023   CREATININE 1.52 (H) 07/28/2023   CALCIUM  9.2 07/28/2023   GFR 29.92 (L) 08/27/2022   EGFR 38.0 09/08/2023   GFRNONAA 45 (L) 03/03/2023  Followed by nephrology Avoid NSAIDs and other nephrotoxic agents Continue calcitriol  0.25 mcg daily      Relevant Orders   CMP14+EGFR     Other   Morbid obesity (HCC)   Patient counseled on low-carb diet Engage in regular moderate exercises as tolerated Wt Readings from Last 3 Encounters:  01/07/24 298 lb (135.2 kg)  01/06/24 298 lb (135.2 kg)  11/30/23 (!) 301 lb (136.5 kg)   Body mass index is 49.59 kg/m.       Anemia of chronic disease   Continue ferrous sulfate  325 mg daily, folic acid  1 mg daily Encouraged to maintain close follow-up with nephrologist Checking CBC and iron  panel Lab Results  Component Value Date   WBC 6.2 03/01/2023   HGB 11.2 (L) 03/01/2023   HCT 38.0 03/01/2023   MCV 73.1 (L) 03/01/2023   PLT 143 (L) 03/01/2023         Relevant Orders   CBC   Iron , TIBC and Ferritin Panel   Vitamin B12   History of stroke   Continue Zetia  10 mg daily, atorvastatin  40 mg daily Checking lipid panel at next visit Lab Results  Component Value Date   CHOL 113 06/30/2023   HDL 52 06/30/2023   LDLCALC 46 06/30/2023   TRIG 70 06/30/2023   CHOLHDL 2.2 06/30/2023          Vitamin D  deficiency   Last vitamin D  Lab Results  Component Value Date   VD25OH 8.3 (L) 01/29/2022  Currently not taking vitamin D  supplement Checking vitamin D  levels      Relevant Orders   VITAMIN D  25 Hydroxy (Vit-D Deficiency, Fractures)   Bilateral leg edema   Nonpitting edema bilaterally DASH diet advised elevation and use of compression socks encouraged Continue furosemide  40 mg daily      Urinary incontinence   Uses depends as needed       No orders of the defined types were placed in this encounter.   Follow-up: Return in about 4 months (around 05/08/2024).    Deaunna Olarte R Kveon Casanas, FNP

## 2024-01-07 NOTE — Assessment & Plan Note (Addendum)
 Continue Zetia  10 mg daily, atorvastatin  40 mg daily Checking lipid panel at next visit Lab Results  Component Value Date   CHOL 113 06/30/2023   HDL 52 06/30/2023   LDLCALC 46 06/30/2023   TRIG 70 06/30/2023   CHOLHDL 2.2 06/30/2023

## 2024-01-08 LAB — CMP14+EGFR
ALT: 16 IU/L (ref 0–32)
AST: 23 IU/L (ref 0–40)
Albumin: 3.7 g/dL — ABNORMAL LOW (ref 3.8–4.8)
Alkaline Phosphatase: 134 IU/L — ABNORMAL HIGH (ref 44–121)
BUN/Creatinine Ratio: 19 (ref 12–28)
BUN: 28 mg/dL — ABNORMAL HIGH (ref 8–27)
Bilirubin Total: 0.5 mg/dL (ref 0.0–1.2)
CO2: 26 mmol/L (ref 20–29)
Calcium: 8.5 mg/dL — ABNORMAL LOW (ref 8.7–10.3)
Chloride: 102 mmol/L (ref 96–106)
Creatinine, Ser: 1.46 mg/dL — ABNORMAL HIGH (ref 0.57–1.00)
Globulin, Total: 2.5 g/dL (ref 1.5–4.5)
Glucose: 85 mg/dL (ref 70–99)
Potassium: 3.9 mmol/L (ref 3.5–5.2)
Sodium: 141 mmol/L (ref 134–144)
Total Protein: 6.2 g/dL (ref 6.0–8.5)
eGFR: 38 mL/min/{1.73_m2} — ABNORMAL LOW (ref >59)

## 2024-01-08 LAB — CBC
Hematocrit: 35.1 % (ref 34.0–46.6)
Hemoglobin: 10.4 g/dL — ABNORMAL LOW (ref 11.1–15.9)
MCH: 22.7 pg — ABNORMAL LOW (ref 26.6–33.0)
MCHC: 29.6 g/dL — ABNORMAL LOW (ref 31.5–35.7)
MCV: 77 fL — ABNORMAL LOW (ref 79–97)
Platelets: 120 10*3/uL — ABNORMAL LOW (ref 150–450)
RBC: 4.59 x10E6/uL (ref 3.77–5.28)
RDW: 13.3 % (ref 11.7–15.4)
WBC: 4.5 10*3/uL (ref 3.4–10.8)

## 2024-01-08 LAB — IRON,TIBC AND FERRITIN PANEL
Ferritin: 542 ng/mL — ABNORMAL HIGH (ref 15–150)
Iron Saturation: 36 % (ref 15–55)
Iron: 73 ug/dL (ref 27–139)
Total Iron Binding Capacity: 202 ug/dL — ABNORMAL LOW (ref 250–450)
UIBC: 129 ug/dL (ref 118–369)

## 2024-01-08 LAB — VITAMIN B12: Vitamin B-12: 1134 pg/mL (ref 232–1245)

## 2024-01-08 LAB — VITAMIN D 25 HYDROXY (VIT D DEFICIENCY, FRACTURES): Vit D, 25-Hydroxy: 16.2 ng/mL — ABNORMAL LOW (ref 30.0–100.0)

## 2024-01-10 ENCOUNTER — Ambulatory Visit: Payer: Self-pay | Admitting: Nurse Practitioner

## 2024-01-10 ENCOUNTER — Other Ambulatory Visit: Payer: Self-pay | Admitting: Nurse Practitioner

## 2024-01-10 ENCOUNTER — Ambulatory Visit: Admitting: Adult Health

## 2024-01-10 ENCOUNTER — Encounter: Payer: Self-pay | Admitting: Adult Health

## 2024-01-10 VITALS — BP 100/60 | HR 81 | Temp 98.3°F | Ht 64.0 in | Wt 301.6 lb

## 2024-01-10 DIAGNOSIS — J9611 Chronic respiratory failure with hypoxia: Secondary | ICD-10-CM | POA: Diagnosis not present

## 2024-01-10 DIAGNOSIS — G4733 Obstructive sleep apnea (adult) (pediatric): Secondary | ICD-10-CM | POA: Diagnosis not present

## 2024-01-10 DIAGNOSIS — J9612 Chronic respiratory failure with hypercapnia: Secondary | ICD-10-CM

## 2024-01-10 DIAGNOSIS — D869 Sarcoidosis, unspecified: Secondary | ICD-10-CM

## 2024-01-10 DIAGNOSIS — E559 Vitamin D deficiency, unspecified: Secondary | ICD-10-CM

## 2024-01-10 DIAGNOSIS — J4540 Moderate persistent asthma, uncomplicated: Secondary | ICD-10-CM | POA: Diagnosis not present

## 2024-01-10 MED ORDER — VITAMIN D3 25 MCG (1000 UT) PO CAPS: 1000.0000 [IU] | ORAL_CAPSULE | Freq: Every day | ORAL | 2 refills | Status: DC

## 2024-01-10 MED ORDER — ALBUTEROL SULFATE (2.5 MG/3ML) 0.083% IN NEBU: INHALATION_SOLUTION | RESPIRATORY_TRACT | 5 refills | Status: DC

## 2024-01-10 MED ORDER — CALTRATE 600+D PLUS MINERALS 600-800 MG-UNIT PO TABS: 600.0000 mg | ORAL_TABLET | Freq: Two times a day (BID) | ORAL | 2 refills | Status: DC

## 2024-01-10 NOTE — Patient Instructions (Addendum)
 Continue on Budesonide  and Albuterol  neb Twice daily   Albuterol  inhaler or neb every 6hr as needed.  Singulair  10mg  At bedtime  Zyrtec  10mg  daily  Albuterol  inhaler or neb As needed   Continue on Oxygen  3-4 l/m to keep O2 sats >90%.  Continue on Lasix  40mg  daily  Continue with follow up with Cardiology and Nephrology  Continue on BIPAP ST machine At bedtime  and with naps.  Follow up with Dr. Darlean  or Sharone Picchi NP in 3-4 months and As needed   Please contact office for sooner follow up if symptoms do not improve or worsen or seek emergency care

## 2024-01-10 NOTE — Progress Notes (Unsigned)
 @Patient  ID: Pamela [name], female    DOB: 12-20-1950, 73 y.o.   MRN: [id number]  Chief Complaint  Patient presents with   Follow-up    Referring provider: Paseda, [name], FNP  HPI: 73 year old female never smoker followed for asthma with allergic phenotype, sarcoidosis, history of lung cancer (diagnosed in 2012 status post SBRT) Chronic respiratory failure on oxygen  from 2013 through 2018, restarted in 2023 Medical history significant for right anterior chest wall mass/right pectoralis minor muscle mass) biopsy done in 2015 which was nondiagnostic, PET scan October 2016 showed no changes in metabolic activity or size suggestive of a benign etiology, chronic kidney disease, congestive heart failure, PFO on echo-bubble study, prior CVA    TEST/EVENTS :  CT chest Nov 20, 2021 similar posttreatment fibrosis/consolidation with volume loss in the right upper lobe.  No evidence of recurrence, tiny right upper lobe pulmonary nodule no suspicious pulmonary nodules.  A 5.5 x 2.1 cm soft tissue mass in the right pectoralis major and minor musculature.    pfts 07/31/13  FEV1  1.53 (58%) with ratio 66 > p saba ratio 74 FEV1 1.68 (64%)    FENO 06/01/2016  =   85    Spirometry 02/22/2018  FEV1 1.23 (57%)  Ratio 71 with min curvature  - FENO 02/22/2018  = 61   05/2022 PFTs today show severe restriction with FEV1 at 51%, ratio 85, FVC 46%, no significant bronchodilator response, DLCO 69%.    Split night sleep study in 07/2014 showed mild OSA only with AHI 6.9. O2 sats nml on 2l/m .    She also carries dx of Sarcoid from previous bx. (followed previously by Dr. Seleta at [facility], with lymphadenopathy in chest , abd w/ possible spleen and liver involvement.    03/2022 CT chest neg for PE, stable RUL scarring likely radiation changes.    CT chest Nov 30, 2022 stable platelike scarring/radiation changes in the right upper lobe, no evidence of recurrent or metastatic disease   11/2022  Absolute Eosinophil 1,500  01/10/2024 Follow up : Asthma, O2 RF, Lung cancer history , OSA/OHS Discussed the use of AI scribe software for clinical note transcription with the patient, who gave verbal consent to proceed.  History of Present Illness   Pamela [name] is a 73 year old female with asthma and obstructive sleep apnea who presents for a three month follow-up.  She has a history of asthma with an allergic phenotype and is on chronic oxygen  therapy at home, currently using three liters of oxygen . She continues to use a nebulizer with budesonide  and albuterol  twice daily and has albuterol  available for use as needed. She is also on Singulair  and Zyrtec  for her allergies. Her breathing is stable with this regimen.  She has obstructive sleep apnea and obesity hypoventilation syndrome and uses a BiPAP ST machine at home every night. She uses it for about five to six hours per night, sometimes more, and is using a full face mask. She ensures the equipment is clean and uses boiled water for the water chamber. Her sleep is well controlled with this setup.  She is on Lasix  as a diuretic and has regular lab work done with her family doctor, including kidney function tests. She reports no issues with eating or vomiting and confirms she is taking her diuretic regularly.  She has a history of lung cancer treated over ten years ago, with recent CT scans showing no suspicious areas and stable radiation changes. She is  up to date with her vaccines, including pneumonia and shingles.        Allergies  Allergen Reactions   Sulfa  Antibiotics Hives and Rash    Immunization History  Administered Date(s) Administered   Fluad Quad(high Dose 65+) 07/28/2023   Influenza,inj,Quad PF,6+ Mos 04/13/2014, 04/16/2015, 04/03/2016, 05/19/2017, 06/15/2018, 05/14/2019, 04/21/2022   PFIZER(Purple Top)SARS-COV-2 Vaccination 10/14/2019, 11/08/2019, 07/08/2020, 03/06/2021   PNEUMOCOCCAL CONJUGATE-20 03/06/2021    Pneumococcal Conjugate-13 04/03/2016   Pneumococcal Polysaccharide-23 04/13/2014, 12/26/2019   Tdap 01/02/2015   Zoster, Live 05/21/2014    Past Medical History:  Diagnosis Date   Arthritis    Asthma    Cancer (HCC)    lung, adenocarcinoma right lung 2012   CHF (congestive heart failure) (HCC)    Preserved EF   Congenital single kidney    With chronic kidney disease   COPD (chronic obstructive pulmonary disease) (HCC)    Gout    Hypertension    Lung cancer (HCC) 2012   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 Patient notes it was consistent with sarcoidosis but actual pathology results not available.   Oxygen  deficiency    Sarcoidosis    Sickle cell trait (HCC)    Sleep apnea     Tobacco History: Social History   Tobacco Use  Smoking Status Never  Smokeless Tobacco Never   Counseling given: Not Answered   Outpatient Medications Prior to Visit  Medication Sig Dispense Refill   acetaminophen  (TYLENOL ) 500 MG tablet Take 1,000 mg by mouth every 6 (six) hours as needed for mild pain.     albuterol  (PROAIR  HFA) 108 (90 Base) MCG/ACT inhaler Inhale 2 puffs into the lungs every 4 (four) hours as needed for wheezing or shortness of breath. 8 g 5   albuterol  (PROVENTIL ) (2.5 MG/3ML) 0.083% nebulizer solution INHALE 1 VIAL IN NEBULIZER EVERY 6 HOURS AS NEEDED FOR WHEEZING FOR SHORTNESS OF BREATH 300 mL 0   aspirin  EC 81 MG tablet Take 1 tablet (81 mg total) by mouth daily. Swallow whole. 30 tablet 11   atorvastatin  (LIPITOR) 40 MG tablet TAKE 1 TABLET EVERY DAY 90 tablet 0   B COMPLEX, FOLIC ACID , PO Take 1 tablet by mouth daily.     B Complex-C (B-COMPLEX WITH VITAMIN C) tablet Take 1 tablet by mouth daily. 120 tablet 0   budesonide  (PULMICORT ) 0.25 MG/2ML nebulizer solution INHALE THE CONTENTS OF 1 VIAL  VIA NEBULIZER TWICE DAILY AS DIRECTED WITH ALBUTEROL  360 mL 3   calcitRIOL  (ROCALTROL ) 0.25 MCG capsule TAKE 1 CAPSULE EVERY DAY 90 capsule 3   cetirizine  (ZYRTEC ) 10 MG chewable tablet Chew 10 mg by mouth daily.     dextromethorphan  (DELSYM ) 30 MG/5ML liquid Take 5 mLs (30 mg total) by mouth 2 (two) times daily. (Patient taking differently: Take 30 mg by mouth as needed for cough.) 89 mL 0   diclofenac  Sodium (VOLTAREN ) 1 % GEL Apply 2 g topically 4 (four) times daily. 100 g 0   docusate sodium  (STOOL SOFTENER) 100 MG capsule Take 100 mg by mouth daily.     ezetimibe  (ZETIA ) 10 MG tablet Take 1 tablet (10 mg total) by mouth daily. 90 tablet 3   famotidine  (PEPCID ) 20 MG tablet TAKE 1 TABLET AFTER SUPPER 90 tablet 3   febuxostat  (ULORIC ) 40 MG tablet TAKE 1  TABLET EVERY DAY 90 tablet 3   ferrous sulfate  325 (65 FE) MG tablet Take 1 tablet (325 mg total) by mouth daily with lunch. 30 tablet 0   furosemide  (LASIX ) 40 MG tablet TAKE 1 TABLET EVERY DAY 90 tablet 3   hydrOXYzine  (ATARAX ) 25 MG tablet Take 1 tablet (25 mg total) by mouth 3 (three) times daily as needed for itching. 30 tablet 0   Incontinence Supply Disposable (DEPEND UNDERWEAR LARGE/XL) MISC 1 each by Does not apply route 2 times daily at 12 noon and 4 pm. 17 each 12   montelukast  (SINGULAIR ) 10 MG tablet TAKE 1 TABLET AT BEDTIME 90 tablet 3   pantoprazole  (PROTONIX ) 40 MG tablet TAKE 1 TABLET EVERY DAY 30 TO 60 MINUTES BEFORE FIRST MEAL OF THE DAY 90 tablet 3   folic acid  (FOLVITE ) 1 MG tablet Take 1 tablet (1 mg total) by mouth daily. (Patient not taking: Reported on 01/10/2024) 120 tablet 0   No facility-administered medications prior to visit.     Review of Systems:   Constitutional:   No  weight loss, night sweats,  Fevers, chills, fatigue, or  lassitude.  HEENT:   No headaches,  Difficulty swallowing,  Tooth/dental problems, or  Sore throat,                No sneezing, itching, ear ache, nasal congestion, post nasal drip,    CV:  No chest pain,  Orthopnea, PND, swelling in lower extremities, anasarca, dizziness, palpitations, syncope.   GI  No heartburn, indigestion, abdominal pain, nausea, vomiting, diarrhea, change in bowel habits, loss of appetite, bloody stools.   Resp: No shortness of breath with exertion or at rest.  No excess mucus, no productive cough,  No non-productive cough,  No coughing up of blood.  No change in color of mucus.  No wheezing.  No chest wall deformity  Skin: no rash or lesions.  GU: no dysuria, change in color of urine, no urgency or frequency.  No flank pain, no hematuria   MS:  No joint pain or swelling.  No decreased range of motion.  No back pain.    Physical Exam  BP 100/60 (BP Location: Left Arm, Patient Position: Sitting, Cuff Size: Normal)   Pulse 81   Temp 98.3 F (36.8 C) (Oral)   Ht 5' 4 (1.626 m)   Wt (!) 301 lb 9.6 oz (136.8 kg)   SpO2 94%   BMI 51.77 kg/m   GEN: A/Ox3; pleasant , NAD, well nourished    HEENT:  [place]/AT,  EACs-clear, TMs-wnl, NOSE-clear, THROAT-clear, no lesions, no postnasal drip or exudate noted.   NECK:  Supple w/ fair ROM; no JVD; normal carotid impulses w/o bruits; no thyromegaly or nodules palpated; no lymphadenopathy.    RESP  Clear  P & A; w/o, wheezes/ rales/ or rhonchi. no accessory muscle use, no dullness to percussion  CARD:  RRR, no m/r/g, no peripheral edema, pulses intact, no cyanosis or clubbing.  GI:   Soft & nt; nml bowel sounds; no organomegaly or masses detected.   Musco: Warm bil, no deformities or joint swelling noted.   Neuro: alert, no focal deficits noted.    Skin: Warm, no lesions or rashes    Lab Results:  CBC    Component Value Date/Time   WBC 4.5 01/07/2024 1203   WBC 6.2 03/01/2023 0311   RBC 4.59 01/07/2024 1203   RBC 5.20 (H) 03/01/2023 0311   HGB 10.4 (L) 01/07/2024 1203  HGB 11.8 05/31/2017 1050   HCT 35.1 01/07/2024 1203   HCT 37.5 05/31/2017 1050   PLT 120 (L) 01/07/2024 1203   MCV  77 (L) 01/07/2024 1203   MCV 71.2 (L) 05/31/2017 1050   MCH 22.7 (L) 01/07/2024 1203   MCH 21.5 (L) 03/01/2023 0311   MCHC 29.6 (L) 01/07/2024 1203   MCHC 29.5 (L) 03/01/2023 0311   RDW 13.3 01/07/2024 1203   RDW 15.1 (H) 05/31/2017 1050   LYMPHSABS 1.4 12/18/2022 0920   LYMPHSABS 1.0 08/29/2019 0941   LYMPHSABS 1.2 05/31/2017 1050   MONOABS 0.5 12/18/2022 0920   MONOABS 0.4 05/31/2017 1050   EOSABS 1.5 (H) 12/18/2022 0920   EOSABS 1.0 (H) 08/29/2019 0941   BASOSABS 0.1 12/18/2022 0920   BASOSABS 0.0 08/29/2019 0941   BASOSABS 0.0 05/31/2017 1050    BMET    Component Value Date/Time   NA 141 01/07/2024 1203   NA 142 05/31/2017 1050   K 3.9 01/07/2024 1203   K 4.2 05/31/2017 1050   CL 102 01/07/2024 1203   CL 101 09/20/2018 0000   CO2 26 01/07/2024 1203   CO2 27 09/20/2018 0000   CO2 30 (H) 05/31/2017 1050   GLUCOSE 85 01/07/2024 1203   GLUCOSE 85 03/03/2023 0917   GLUCOSE 94 05/31/2017 1050   BUN 28 (H) 01/07/2024 1203   BUN 26.0 05/31/2017 1050   CREATININE 1.46 (H) 01/07/2024 1203   CREATININE 1.59 (H) 12/18/2022 0920   CREATININE 1.6 (H) 05/31/2017 1050   CALCIUM  8.5 (L) 01/07/2024 1203   CALCIUM  8.2 09/20/2018 0000   CALCIUM  9.0 05/31/2017 1050   GFRNONAA 45 (L) 03/03/2023 0917   GFRNONAA 35 (L) 12/18/2022 0920   GFRNONAA 36 (L) 05/19/2017 1210   GFRAA 20 (L) 04/26/2020 1011   GFRAA 37 (L) 06/23/2018 1300   GFRAA 41 (L) 05/19/2017 1210    BNP    Component Value Date/Time   BNP 23.4 02/23/2023 1256   BNP 65.7 08/31/2014 0937    ProBNP    Component Value Date/Time   PROBNP 494 (H) 04/26/2020 1011   PROBNP 1,193.0 (H) 06/11/2014 1929    Imaging: DG Ankle Complete Left Result Date: 12/14/2023 Please see detailed radiograph report in office note.   Administration History     None          Latest Ref Rng & Units 05/22/2022    8:13 AM 08/30/2018   10:03 AM 07/19/2014   11:01 AM  PFT Results  FVC-Pre L 1.41  1.92  2.32   FVC-Predicted Pre  % 42  69  85   FVC-Post L 1.51  2.13  2.36   FVC-Predicted Post % 46  77  86   Pre FEV1/FVC % % 84  83  71   Post FEV1/FCV % % 85  79  85   FEV1-Pre L 1.18  1.59  1.64   FEV1-Predicted Pre % 47  74  76   FEV1-Post L 1.29  1.69  2.00   DLCO uncorrected ml/min/mmHg 14.60  23.90  18.43   DLCO UNC% % 69  112  71   DLCO corrected ml/min/mmHg 14.60     DLCO COR %Predicted % 69     DLVA Predicted % 111  154  101   TLC L 3.59     TLC % Predicted % 67     RV % Predicted % 66       Lab Results  Component Value Date   NITRICOXIDE 56  08/30/2018        Assessment & Plan:   No problem-specific Assessment & Plan notes found for this encounter.   Assessment and Plan    Asthma with allergic phenotype   Her asthma is well-managed with nebulizer treatment and allergy control medications. Continue nebulizer treatment with budesonide  and albuterol  twice daily, and use albuterol  as needed. Maintain Singulair  and Zyrtec  for allergy control. Refill liquid butisol.  Obstructive sleep apnea   Her obstructive sleep apnea is well-controlled with BiPAP ST, used nightly for 5-6 hours without issues. Continue BiPAP ST every night, ensuring the use of distilled or boiled water in the BiPAP water chamber. Maintain cleanliness of the BiPAP mask.  Obesity hypoventilation syndrome (OHS)   OHS is managed with BiPAP ST. Continue BiPAP ST therapy.  Lung cancer   Her lung cancer is in remission with no recurrence. A recent CT scan showed no suspicious areas and stable radiation changes. The last occurrence was over ten years ago. Continue routine surveillance as per oncologist's recommendations.  General Health Maintenance   Her immunizations are up to date, including pneumonia and shingles vaccines. Review COVID-19, flu, and RSV vaccination status in the fall.        Madelin Stank, NP 01/10/2024

## 2024-01-31 DIAGNOSIS — J449 Chronic obstructive pulmonary disease, unspecified: Secondary | ICD-10-CM | POA: Diagnosis not present

## 2024-01-31 DIAGNOSIS — J9612 Chronic respiratory failure with hypercapnia: Secondary | ICD-10-CM | POA: Diagnosis not present

## 2024-02-06 DIAGNOSIS — J441 Chronic obstructive pulmonary disease with (acute) exacerbation: Secondary | ICD-10-CM | POA: Diagnosis not present

## 2024-02-06 DIAGNOSIS — J9601 Acute respiratory failure with hypoxia: Secondary | ICD-10-CM | POA: Diagnosis not present

## 2024-02-06 DIAGNOSIS — I5032 Chronic diastolic (congestive) heart failure: Secondary | ICD-10-CM | POA: Diagnosis not present

## 2024-02-08 ENCOUNTER — Ambulatory Visit: Payer: Self-pay | Admitting: Nurse Practitioner

## 2024-02-10 ENCOUNTER — Ambulatory Visit: Payer: Self-pay

## 2024-02-24 ENCOUNTER — Ambulatory Visit: Payer: Self-pay

## 2024-02-24 VITALS — Ht 64.0 in | Wt 292.0 lb

## 2024-02-24 DIAGNOSIS — Z0000 Encounter for general adult medical examination without abnormal findings: Secondary | ICD-10-CM | POA: Diagnosis not present

## 2024-02-24 NOTE — Progress Notes (Signed)
 Subjective:   Pamela [name] is a 73 y.o. female who presents for Medicare Annual (Subsequent) preventive examination.  Visit Complete: Virtual I connected with  Pamela [name] on 02/24/24 by a audio enabled telemedicine application and verified that I am speaking with the correct person using two identifiers.  Patient Location: Home  Provider Location: Office/Clinic  I discussed the limitations of evaluation and management by telemedicine. The patient expressed understanding and agreed to proceed.  Vital Signs: Because this visit was a virtual/telehealth visit, some criteria may be missing or patient reported. Any vitals not documented were not able to be obtained and vitals that have been documented are patient reported.  Patient Medicare AWV questionnaire was completed by the patient on 02/24/24; I have confirmed that all information answered by patient is correct and no changes since this date.  Cardiac Risk Factors include: advanced age (>30men, >47 women);obesity (BMI >30kg/m2);family history of premature cardiovascular disease;hypertension     Objective:    Today's Vitals   02/24/24 1042  Weight: 292 lb (132.5 kg)  Height: 5' 4 (1.626 m)   Body mass index is 50.12 kg/m.     02/24/2024   11:00 AM 07/22/2023   10:16 AM 02/24/2023    9:44 AM 02/23/2023   12:37 PM 08/16/2022   12:30 PM 07/15/2022    9:12 AM 04/04/2022    6:00 AM  Advanced Directives  Does Patient Have a Medical Advance Directive? No Yes  No No Yes Yes  Type of Advance Directive  Living will;Healthcare Power of [employer] Power of [place];Living will;Out of facility DNR (pink MOST or yellow form) Healthcare Power of Attorney  Does patient want to make changes to medical advance directive?  No - Patient declined    No - Patient declined No - Patient declined  Copy of Healthcare Power of Attorney in Chart?  Yes - validated most recent copy scanned in chart (See row information)    Yes - validated most  recent copy scanned in chart (See row information) No - copy requested  Would patient like information on creating a medical advance directive? No - Patient declined  No - Patient declined  No - Patient declined      Current Medications (verified) Outpatient Encounter Medications as of 02/24/2024  Medication Sig   acetaminophen  (TYLENOL ) 500 MG tablet Take 1,000 mg by mouth every 6 (six) hours as needed for mild pain.   albuterol  (PROAIR  HFA) 108 (90 Base) MCG/ACT inhaler Inhale 2 puffs into the lungs every 4 (four) hours as needed for wheezing or shortness of breath.   albuterol  (PROVENTIL ) (2.5 MG/3ML) 0.083% nebulizer solution INHALE 1 VIAL IN NEBULIZER EVERY 6 HOURS AS NEEDED FOR WHEEZING FOR SHORTNESS OF BREATH   aspirin  EC 81 MG tablet Take 1 tablet (81 mg total) by mouth daily. Swallow whole.   atorvastatin  (LIPITOR) 40 MG tablet TAKE 1 TABLET EVERY DAY   B COMPLEX, FOLIC ACID , PO Take 1 tablet by mouth daily.   B Complex-C (B-COMPLEX WITH VITAMIN C) tablet Take 1 tablet by mouth daily.   budesonide  (PULMICORT ) 0.25 MG/2ML nebulizer solution INHALE THE CONTENTS OF 1 VIAL VIA NEBULIZER TWICE DAILY AS DIRECTED WITH ALBUTEROL    calcitRIOL  (ROCALTROL ) 0.25 MCG capsule TAKE 1 CAPSULE EVERY DAY   cetirizine  (ZYRTEC ) 10 MG chewable tablet Chew 10 mg by mouth daily.   Cholecalciferol (VITAMIN D3) 25 MCG (1000 UT) CAPS Take 1 capsule (1,000 Units total) by mouth daily.   dextromethorphan  (DELSYM ) 30  MG/5ML liquid Take 5 mLs (30 mg total) by mouth 2 (two) times daily.   diclofenac  Sodium (VOLTAREN ) 1 % GEL Apply 2 g topically 4 (four) times daily.   docusate sodium  (STOOL SOFTENER) 100 MG capsule Take 100 mg by mouth daily.   ezetimibe  (ZETIA ) 10 MG tablet Take 1 tablet (10 mg total) by mouth daily.   famotidine  (PEPCID ) 20 MG tablet TAKE 1 TABLET AFTER SUPPER   febuxostat  (ULORIC ) 40 MG tablet TAKE 1 TABLET EVERY DAY   folic acid  (FOLVITE ) 1 MG tablet Take 1 tablet (1 mg total) by mouth daily.    furosemide  (LASIX ) 40 MG tablet TAKE 1 TABLET EVERY DAY   hydrOXYzine  (ATARAX ) 25 MG tablet Take 1 tablet (25 mg total) by mouth 3 (three) times daily as needed for itching.   Incontinence Supply Disposable (DEPEND UNDERWEAR LARGE/XL) MISC 1 each by Does not apply route 2 times daily at 12 noon and 4 pm.   montelukast  (SINGULAIR ) 10 MG tablet TAKE 1 TABLET AT BEDTIME   pantoprazole  (PROTONIX ) 40 MG tablet TAKE 1 TABLET EVERY DAY 30 TO 60 MINUTES BEFORE FIRST MEAL OF THE DAY   No facility-administered encounter medications on file as of 02/24/2024.    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 Patient notes it was consistent with sarcoidosis but actual pathology results not available.   Oxygen  deficiency    Sarcoidosis    Sickle cell trait (HCC)    Sleep apnea    Past Surgical History:  Procedure Laterality Date   LOOP RECORDER INSERTION N/A 09/08/2018   Procedure: LOOP RECORDER INSERTION;  Surgeon: Waddell Danelle ORN, MD;  Location: [facility] INVASIVE [facility];  Service: Cardiovascular;  Laterality: N/A;   LUNG BIOPSY     TEE WITHOUT CARDIOVERSION N/A 09/08/2018   Procedure: TRANSESOPHAGEAL ECHOCARDIOGRAM (TEE);  Surgeon: Delford Maude BROCKS, MD;  Location: [facility] ENDOSCOPY;  Service: Cardiovascular;  Laterality: N/A;  with loop   TUBAL LIGATION     VEIN LIGATION AND STRIPPING     Family History  Problem Relation Age of Onset   Hypertension  Mother    Renal Disease Mother    High blood pressure Mother    Heart disease Mother    Cancer Father        stomach   Heart disease Father        No details   Cervical cancer Sister    Diabetes Sister    Multiple myeloma Sister    Cancer Brother    Diabetes Brother    Social History   Socioeconomic History   Marital status: Divorced    Spouse name: Not on file   Number of children: 2   Years of education: Not on file   Highest education level: Not on file  Occupational History   Not on file  Tobacco Use  Smoking status: Never   Smokeless tobacco: Never  Vaping Use   Vaping status: Never Used  Substance and Sexual Activity   Alcohol use: Not on file   Drug use: No   Sexual activity: Not Currently  Other Topics Concern   Not on file  Social History Narrative   Lives with son.  One son is deceased.     Social Drivers of Health   Financial Resource Strain: Medium Risk (02/24/2024)   Overall Financial Resource Strain (CARDIA)    Difficulty of Paying Living Expenses: Somewhat hard  Food Insecurity: No Food Insecurity (02/24/2024)   Hunger Vital Sign    Worried About Running Out of Food in the Last Year: Never true    Ran Out of Food in the Last Year: Never true  Transportation Needs: No Transportation Needs (02/24/2024)   PRAPARE - Administrator, Civil Service (Medical): No    Lack of Transportation (Non-Medical): No  Physical Activity: Insufficiently Active (02/24/2024)   Exercise Vital Sign    Days of Exercise per Week: 7 days    Minutes of Exercise per Session: 20 min  Stress: No Stress Concern Present (02/24/2024)   [employer] of Occupational Health - Occupational Stress Questionnaire    Feeling of Stress: Not at all  Social Connections: Moderately Integrated (02/24/2024)   Social Connection and Isolation Panel    Frequency of Communication with Friends and Family: More than three times a week    Frequency of Social Gatherings with Friends and  Family: More than three times a week    Attends Religious Services: More than 4 times per year    Active Member of [employer] or Organizations: Yes    Attends Engineer, structural: More than 4 times per year    Marital Status: Divorced    Tobacco Counseling Counseling given: Not Answered   Clinical Intake:  Pre-visit preparation completed: No  Pain : No/denies pain     BMI - recorded: 50.12 Nutritional Status: BMI > 30  Obese Nutritional Risks: None Diabetes: No  How often do you need to have someone help you when you read instructions, pamphlets, or other written materials from your doctor or pharmacy?: 1 - Never What is the last grade level you completed in school?: some college  Interpreter Needed?: No  Information entered by :: Suzen Shove   Activities of Daily Living    02/24/2024   10:44 AM 07/22/2023   10:06 AM  In your present state of health, do you have any difficulty performing the following activities:  Hearing? 1 0  Comment left ear   Vision? 0 0  Difficulty concentrating or making decisions? 0 0  Walking or climbing stairs? 0 1  Comment use cane working on this with P/T  Dressing or bathing? 0 0  Doing errands, shopping? 0 0  Preparing Food and eating ? N N  Using the Toilet? N N  In the past six months, have you accidently leaked urine? Y N  Comment wears depends   Do you have problems with loss of bowel control? N N  Managing your Medications? N N  Managing your Finances? N N  Housekeeping or managing your Housekeeping? N N    Patient Care Team: Paseda, [name], FNP as PCP - General (Nurse Practitioner) Santo Stanly LABOR, MD as PCP - Cardiology (Cardiology) Darlean Ozell NOVAK, MD as Consulting Physician (Pulmonary Disease) Parrett, Madelin RAMAN, NP as Nurse Practitioner (Pulmonary Disease)  Indicate any recent  Medical Services you may have received from other than [facility] providers in the past year (date may be approximate).      Assessment:   This is a routine wellness examination for Lashay.  Hearing/Vision screen No results found.   Goals Addressed             This Visit's Progress    [facility] - Set My Weight Loss Goal   On track      Timeframe:  Long-Range Goal Priority:  Medium Start Date:           5/17                  Expected End Date:  11/17                Barriers: Health Behaviors    - set weight loss goal - lose 20 pounds   Why is this important?   Losing only 5 to 15 percent of your weight makes a big difference in your health.    Notes:   5/17 - Will provide member with education regarding weight loss.  6/16 - Verbalizes understanding regarding importance of daily weights.  Today was 341 pounds.  Monitoring but not recording.  Advised to record trends  9/8 - Report weight today was 344 pounds.  State she has been as low as 335, but has fluctuated back up, not going over 347.  She is watching her diet/salt intake.  Will increase exercise/walking to help achieve goal       Depression Screen    02/24/2024   10:58 AM 01/07/2024   11:47 AM 11/30/2023    4:18 PM 07/22/2023   10:18 AM 07/22/2023   10:14 AM 03/30/2023   10:26 AM 01/26/2023    9:35 AM  PHQ 2/9 Scores  PHQ - 2 Score 0 0 0 0 0 0 0  PHQ- 9 Score 0 0         Fall Risk    02/24/2024   11:01 AM 01/10/2024   10:17 AM 10/08/2023    9:48 AM 07/22/2023   10:17 AM 10/27/2022   10:13 AM  Fall Risk   Falls in the past year? 0 0 0 0 0  Number falls in past yr: 0  0 0 0  Injury with Fall? 0  0 0 0  Risk for fall due to : No Fall Risks   No Fall Risks No Fall Risks  Follow up Falls evaluation completed   Falls evaluation completed Falls evaluation completed    MEDICARE RISK AT HOME: Medicare Risk at Home Any stairs in or around the home?: Yes If so, are there any without handrails?: Yes Home free of loose throw rugs in walkways, pet beds, electrical cords, etc?: Yes Adequate lighting in your home to reduce risk of falls?:  Yes Life alert?: No Use of a cane, walker or w/c?: Yes Grab bars in the bathroom?: Yes Shower chair or bench in shower?: No Elevated toilet seat or a handicapped toilet?: Yes  TIMED UP AND GO:  Was the test performed?  No    Cognitive Function:    06/15/2018   11:56 AM  MMSE - Mini Mental State Exam  Orientation to time 5  Orientation to Place 5  Registration 3  Attention/ Calculation 5  Recall 3  Language- name 2 objects 2  Language- repeat 1  Language- follow 3 step command 3  Language- read & follow direction 1  Write a sentence 1  Copy design 1  Total score 30        02/24/2024   11:03 AM 07/22/2023   10:19 AM 07/15/2022    9:14 AM  6CIT Screen  What Year? 0 points 0 points 0 points  What month? 0 points 0 points 0 points  What time? 0 points 0 points 0 points  Count back from 20 0 points 0 points 0 points  Months in reverse 0 points 0 points 0 points  Repeat phrase 0 points 0 points 0 points  Total Score 0 points 0 points 0 points    Immunizations Immunization History  Administered Date(s) Administered   Fluad Quad(high Dose 65+) 07/28/2023   Influenza,inj,Quad PF,6+ Mos 04/13/2014, 04/16/2015, 04/03/2016, 05/19/2017, 06/15/2018, 05/14/2019, 04/21/2022   PFIZER(Purple Top)SARS-COV-2 Vaccination 10/14/2019, 11/08/2019, 07/08/2020, 03/06/2021   PNEUMOCOCCAL CONJUGATE-20 03/06/2021   Pneumococcal Conjugate-13 04/03/2016   Pneumococcal Polysaccharide-23 04/13/2014, 12/26/2019   Tdap 01/02/2015   Zoster, Live 05/21/2014    TDAP status: Up to date  Flu Vaccine status: Up to date  Pneumococcal vaccine status: Up to date  Covid-19 vaccine status: Declined, Education has been provided regarding the importance of this vaccine but patient still declined. Advised may receive this vaccine at local pharmacy or Health Dept.or vaccine clinic. Aware to provide a copy of the vaccination record if obtained from local pharmacy or Health Dept. Verbalized acceptance and  understanding.  Qualifies for Shingles Vaccine? Yes   Zostavax completed Yes  per pt Shingrix Completed?: Yes  Screening Tests Health Maintenance  Topic Date Due   Zoster Vaccines- Shingrix (1 of 2) 11/07/1969   COVID-19 Vaccine (5 - 2024-25 season) 03/21/2023   INFLUENZA VACCINE  02/18/2024   Fecal DNA (Cologuard)  10/08/2024   DTaP/Tdap/Td (2 - Td or Tdap) 01/01/2025   Medicare Annual Wellness (AWV)  02/23/2025   MAMMOGRAM  07/11/2025   Pneumococcal Vaccine: 50+ Years  Completed   DEXA SCAN  Completed   Hepatitis C Screening  Completed   Hepatitis B Vaccines  Aged Out   HPV VACCINES  Aged Out   Meningococcal B Vaccine  Aged Out    Health Maintenance  Health Maintenance Due  Topic Date Due   Zoster Vaccines- Shingrix (1 of 2) 11/07/1969   COVID-19 Vaccine (5 - 2024-25 season) 03/21/2023   INFLUENZA VACCINE  02/18/2024    Colorectal cancer screening: Type of screening: Cologuard. Completed 10/08/21. Repeat every 3 years  Mammogram status: Completed 07/12/23. Repeat every year  Bone Density status: Completed 01/06/23. Results reflect: Bone density results: NORMAL. Repeat every 2 years.  Lung Cancer Screening: (Low Dose CT Chest recommended if Age 13-80 years, 20 pack-year currently smoking OR have quit w/in 15years.) does not qualify.   Lung Cancer Screening Referral: N/C  Additional Screening:  Hepatitis C Screening: does not qualify; Completed 04/03/16  Vision Screening: Recommended annual ophthalmology exams for early detection of glaucoma and other disorders of the eye. Is the patient up to date with their annual eye exam?  Yes  Who is the provider or what is the name of the office in which the patient attends annual eye exams? Dr. Cleatus If pt is not established with a provider, would they like to be referred to a provider to establish care? No .   Dental Screening: Recommended annual dental exams for proper oral hygiene   Community Resource Referral / Chronic  Care Management: CRR required this visit?  No   CCM required this visit?  No     Plan:  I have personally reviewed and noted the following in the patient's chart:   Medical and social history Use of alcohol, tobacco or illicit drugs  Current medications and supplements including opioid prescriptions. Patient is not currently taking opioid prescriptions. Functional ability and status Nutritional status Physical activity Advanced directives List of other physicians Hospitalizations, surgeries, and ER visits in previous 12 months Vitals Screenings to include cognitive, depression, and falls Referrals and appointments  In addition, I have reviewed and discussed with patient certain preventive protocols, quality metrics, and best practice recommendations. A written personalized care plan for preventive services as well as general preventive health recommendations were provided to patient.     Suzen Shove, RMA   02/24/2024   After Visit Summary: (Mail) Due to this being a telephonic visit, the after visit summary with patients personalized plan was offered to patient via mail   Nurse Notes: Pt changed insurance and needed new AWV. Thank you for your time.   Suzen Shove   CMA II

## 2024-03-01 ENCOUNTER — Emergency Department (HOSPITAL_COMMUNITY)

## 2024-03-01 ENCOUNTER — Encounter (HOSPITAL_COMMUNITY): Payer: Self-pay

## 2024-03-01 ENCOUNTER — Other Ambulatory Visit: Payer: Self-pay

## 2024-03-01 ENCOUNTER — Inpatient Hospital Stay (HOSPITAL_COMMUNITY)
Admission: EM | Admit: 2024-03-01 | Discharge: 2024-03-05 | DRG: 177 | Disposition: A | Discharge status: Home / Self Care | Attending: Internal Medicine | Admitting: Internal Medicine

## 2024-03-01 DIAGNOSIS — I5032 Chronic diastolic (congestive) heart failure: Secondary | ICD-10-CM | POA: Diagnosis present

## 2024-03-01 DIAGNOSIS — Z8049 Family history of malignant neoplasm of other genital organs: Secondary | ICD-10-CM

## 2024-03-01 DIAGNOSIS — Z85118 Personal history of other malignant neoplasm of bronchus and lung: Secondary | ICD-10-CM

## 2024-03-01 DIAGNOSIS — Z7982 Long term (current) use of aspirin: Secondary | ICD-10-CM

## 2024-03-01 DIAGNOSIS — Z833 Family history of diabetes mellitus: Secondary | ICD-10-CM | POA: Diagnosis not present

## 2024-03-01 DIAGNOSIS — N1832 Chronic kidney disease, stage 3b: Secondary | ICD-10-CM | POA: Diagnosis not present

## 2024-03-01 DIAGNOSIS — I444 Left anterior fascicular block: Secondary | ICD-10-CM | POA: Diagnosis not present

## 2024-03-01 DIAGNOSIS — Z882 Allergy status to sulfonamides status: Secondary | ICD-10-CM

## 2024-03-01 DIAGNOSIS — R918 Other nonspecific abnormal finding of lung field: Secondary | ICD-10-CM | POA: Diagnosis not present

## 2024-03-01 DIAGNOSIS — M109 Gout, unspecified: Secondary | ICD-10-CM | POA: Diagnosis present

## 2024-03-01 DIAGNOSIS — Z841 Family history of disorders of kidney and ureter: Secondary | ICD-10-CM | POA: Diagnosis not present

## 2024-03-01 DIAGNOSIS — J4489 Other specified chronic obstructive pulmonary disease: Secondary | ICD-10-CM | POA: Diagnosis not present

## 2024-03-01 DIAGNOSIS — U071 COVID-19: Principal | ICD-10-CM | POA: Diagnosis present

## 2024-03-01 DIAGNOSIS — E662 Morbid (severe) obesity with alveolar hypoventilation: Secondary | ICD-10-CM | POA: Diagnosis present

## 2024-03-01 DIAGNOSIS — D573 Sickle-cell trait: Secondary | ICD-10-CM | POA: Diagnosis present

## 2024-03-01 DIAGNOSIS — Z6841 Body Mass Index (BMI) 40.0 and over, adult: Secondary | ICD-10-CM

## 2024-03-01 DIAGNOSIS — Z9981 Dependence on supplemental oxygen: Secondary | ICD-10-CM

## 2024-03-01 DIAGNOSIS — D869 Sarcoidosis, unspecified: Secondary | ICD-10-CM | POA: Diagnosis present

## 2024-03-01 DIAGNOSIS — R059 Cough, unspecified: Secondary | ICD-10-CM | POA: Diagnosis not present

## 2024-03-01 DIAGNOSIS — I130 Hypertensive heart and chronic kidney disease with heart failure and stage 1 through stage 4 chronic kidney disease, or unspecified chronic kidney disease: Secondary | ICD-10-CM | POA: Diagnosis present

## 2024-03-01 DIAGNOSIS — J4541 Moderate persistent asthma with (acute) exacerbation: Secondary | ICD-10-CM | POA: Diagnosis present

## 2024-03-01 DIAGNOSIS — R062 Wheezing: Secondary | ICD-10-CM | POA: Diagnosis not present

## 2024-03-01 DIAGNOSIS — Z7951 Long term (current) use of inhaled steroids: Secondary | ICD-10-CM

## 2024-03-01 DIAGNOSIS — Z79899 Other long term (current) drug therapy: Secondary | ICD-10-CM | POA: Diagnosis not present

## 2024-03-01 DIAGNOSIS — D631 Anemia in chronic kidney disease: Secondary | ICD-10-CM | POA: Diagnosis not present

## 2024-03-01 DIAGNOSIS — R0602 Shortness of breath: Secondary | ICD-10-CM | POA: Diagnosis not present

## 2024-03-01 DIAGNOSIS — Z8249 Family history of ischemic heart disease and other diseases of the circulatory system: Secondary | ICD-10-CM | POA: Diagnosis not present

## 2024-03-01 DIAGNOSIS — Z807 Family history of other malignant neoplasms of lymphoid, hematopoietic and related tissues: Secondary | ICD-10-CM

## 2024-03-01 DIAGNOSIS — J440 Chronic obstructive pulmonary disease with acute lower respiratory infection: Secondary | ICD-10-CM | POA: Diagnosis not present

## 2024-03-01 DIAGNOSIS — J1282 Pneumonia due to coronavirus disease 2019: Secondary | ICD-10-CM | POA: Diagnosis present

## 2024-03-01 DIAGNOSIS — J9621 Acute and chronic respiratory failure with hypoxia: Secondary | ICD-10-CM | POA: Diagnosis present

## 2024-03-01 DIAGNOSIS — G4733 Obstructive sleep apnea (adult) (pediatric): Secondary | ICD-10-CM | POA: Diagnosis present

## 2024-03-01 DIAGNOSIS — E785 Hyperlipidemia, unspecified: Secondary | ICD-10-CM | POA: Diagnosis not present

## 2024-03-01 DIAGNOSIS — I700 Atherosclerosis of aorta: Secondary | ICD-10-CM | POA: Diagnosis not present

## 2024-03-01 DIAGNOSIS — J4530 Mild persistent asthma, uncomplicated: Secondary | ICD-10-CM

## 2024-03-01 DIAGNOSIS — D638 Anemia in other chronic diseases classified elsewhere: Secondary | ICD-10-CM | POA: Diagnosis present

## 2024-03-01 DIAGNOSIS — M1990 Unspecified osteoarthritis, unspecified site: Secondary | ICD-10-CM | POA: Diagnosis present

## 2024-03-01 LAB — COMPREHENSIVE METABOLIC PANEL WITH GFR
ALT: 19 U/L (ref 0–44)
AST: 31 U/L (ref 15–41)
Albumin: 3.7 g/dL (ref 3.5–5.0)
Alkaline Phosphatase: 128 U/L — ABNORMAL HIGH (ref 38–126)
Anion gap: 15 (ref 5–15)
BUN: 25 mg/dL — ABNORMAL HIGH (ref 8–23)
CO2: 32 mmol/L (ref 22–32)
Calcium: 9.3 mg/dL (ref 8.9–10.3)
Chloride: 96 mmol/L — ABNORMAL LOW (ref 98–111)
Creatinine, Ser: 1.12 mg/dL — ABNORMAL HIGH (ref 0.44–1.00)
GFR, Estimated: 53 mL/min — ABNORMAL LOW (ref >60)
Glucose, Bld: 153 mg/dL — ABNORMAL HIGH (ref 70–99)
Potassium: 3.8 mmol/L (ref 3.5–5.1)
Sodium: 143 mmol/L (ref 135–145)
Total Bilirubin: 0.6 mg/dL (ref 0.0–1.2)
Total Protein: 7.7 g/dL (ref 6.5–8.1)

## 2024-03-01 LAB — CBC
HCT: 39 % (ref 36.0–46.0)
Hemoglobin: 11.4 g/dL — ABNORMAL LOW (ref 12.0–15.0)
MCH: 22.3 pg — ABNORMAL LOW (ref 26.0–34.0)
MCHC: 29.2 g/dL — ABNORMAL LOW (ref 30.0–36.0)
MCV: 76.2 fL — ABNORMAL LOW (ref 80.0–100.0)
Platelets: 151 K/uL (ref 150–400)
RBC: 5.12 MIL/uL — ABNORMAL HIGH (ref 3.87–5.11)
RDW: 14.6 % (ref 11.5–15.5)
WBC: 5.9 K/uL (ref 4.0–10.5)
nRBC: 0 % (ref 0.0–0.2)

## 2024-03-01 LAB — BRAIN NATRIURETIC PEPTIDE: B Natriuretic Peptide: 58.8 pg/mL (ref 0.0–100.0)

## 2024-03-01 LAB — RESP PANEL BY RT-PCR (RSV, FLU A&B, COVID)  RVPGX2
Influenza A by PCR: NEGATIVE
Influenza B by PCR: NEGATIVE
Resp Syncytial Virus by PCR: NEGATIVE
SARS Coronavirus 2 by RT PCR: POSITIVE — AB

## 2024-03-01 MED ORDER — ACETAMINOPHEN 500 MG PO TABS
1000.0000 mg | ORAL_TABLET | Freq: Once | ORAL | Status: AC
  Administered 2024-03-01 (×2): 1000 mg via ORAL
  Filled 2024-03-01: qty 2

## 2024-03-01 MED ORDER — HYDRALAZINE HCL 20 MG/ML IJ SOLN: 10.0000 mg | INTRAMUSCULAR | Status: DC | PRN

## 2024-03-01 MED ORDER — PANTOPRAZOLE SODIUM 40 MG PO TBEC
40.0000 mg | DELAYED_RELEASE_TABLET | Freq: Every day | ORAL | Status: DC
  Administered 2024-03-02 – 2024-03-05 (×4): 40 mg via ORAL
  Filled 2024-03-01 (×4): qty 1

## 2024-03-01 MED ORDER — MONTELUKAST SODIUM 10 MG PO TABS
10.0000 mg | ORAL_TABLET | Freq: Every day | ORAL | Status: DC
  Administered 2024-03-02 – 2024-03-04 (×3): 10 mg via ORAL
  Filled 2024-03-01 (×3): qty 1

## 2024-03-01 MED ORDER — IPRATROPIUM-ALBUTEROL 0.5-2.5 (3) MG/3ML IN SOLN: 3.0000 mL | Freq: Four times a day (QID) | RESPIRATORY_TRACT | Status: DC | PRN

## 2024-03-01 MED ORDER — BENZONATATE 100 MG PO CAPS
200.0000 mg | ORAL_CAPSULE | Freq: Once | ORAL | Status: AC
  Administered 2024-03-01 (×2): 200 mg via ORAL
  Filled 2024-03-01: qty 2

## 2024-03-01 MED ORDER — EZETIMIBE 10 MG PO TABS
10.0000 mg | ORAL_TABLET | Freq: Every day | ORAL | Status: DC
  Administered 2024-03-02 – 2024-03-05 (×4): 10 mg via ORAL
  Filled 2024-03-01 (×4): qty 1

## 2024-03-01 MED ORDER — FUROSEMIDE 40 MG PO TABS
40.0000 mg | ORAL_TABLET | Freq: Every day | ORAL | Status: DC
  Administered 2024-03-02 – 2024-03-05 (×4): 40 mg via ORAL
  Filled 2024-03-01 (×4): qty 1

## 2024-03-01 MED ORDER — ONDANSETRON HCL 4 MG/2ML IJ SOLN: 4.0000 mg | Freq: Four times a day (QID) | INTRAMUSCULAR | Status: DC | PRN

## 2024-03-01 MED ORDER — ACETAMINOPHEN 650 MG RE SUPP: 650.0000 mg | Freq: Four times a day (QID) | RECTAL | Status: DC | PRN

## 2024-03-01 MED ORDER — SENNOSIDES-DOCUSATE SODIUM 8.6-50 MG PO TABS
1.0000 | ORAL_TABLET | Freq: Every evening | ORAL | Status: DC | PRN
  Administered 2024-03-02 – 2024-03-04 (×3): 1 via ORAL
  Filled 2024-03-01 (×3): qty 1

## 2024-03-01 MED ORDER — BUDESONIDE 0.25 MG/2ML IN SUSP
0.2500 mg | Freq: Two times a day (BID) | RESPIRATORY_TRACT | Status: DC
  Administered 2024-03-01 – 2024-03-05 (×9): 0.25 mg via RESPIRATORY_TRACT
  Filled 2024-03-01 (×8): qty 2

## 2024-03-01 MED ORDER — IPRATROPIUM-ALBUTEROL 0.5-2.5 (3) MG/3ML IN SOLN
3.0000 mL | Freq: Once | RESPIRATORY_TRACT | Status: AC
  Administered 2024-03-01 (×2): 3 mL via RESPIRATORY_TRACT
  Filled 2024-03-01: qty 3

## 2024-03-01 MED ORDER — METHYLPREDNISOLONE SODIUM SUCC 40 MG IJ SOLR
40.0000 mg | Freq: Two times a day (BID) | INTRAMUSCULAR | Status: DC
  Administered 2024-03-02 – 2024-03-05 (×7): 40 mg via INTRAVENOUS
  Filled 2024-03-01 (×7): qty 1

## 2024-03-01 MED ORDER — ARFORMOTEROL TARTRATE 15 MCG/2ML IN NEBU
15.0000 ug | INHALATION_SOLUTION | Freq: Two times a day (BID) | RESPIRATORY_TRACT | Status: DC
  Administered 2024-03-01 – 2024-03-05 (×9): 15 ug via RESPIRATORY_TRACT
  Filled 2024-03-01 (×8): qty 2

## 2024-03-01 MED ORDER — ACETAMINOPHEN 325 MG PO TABS
650.0000 mg | ORAL_TABLET | Freq: Four times a day (QID) | ORAL | Status: DC | PRN
  Administered 2024-03-02 – 2024-03-03 (×2): 650 mg via ORAL
  Filled 2024-03-01 (×2): qty 2

## 2024-03-01 MED ORDER — FEBUXOSTAT 40 MG PO TABS
40.0000 mg | ORAL_TABLET | Freq: Every day | ORAL | Status: DC
  Administered 2024-03-02 – 2024-03-05 (×4): 40 mg via ORAL
  Filled 2024-03-01 (×4): qty 1

## 2024-03-01 MED ORDER — ONDANSETRON HCL 4 MG PO TABS: 4.0000 mg | ORAL_TABLET | Freq: Four times a day (QID) | ORAL | Status: DC | PRN

## 2024-03-01 MED ORDER — FAMOTIDINE 20 MG PO TABS
20.0000 mg | ORAL_TABLET | Freq: Every day | ORAL | Status: DC
  Administered 2024-03-02 – 2024-03-05 (×4): 20 mg via ORAL
  Filled 2024-03-01 (×4): qty 1

## 2024-03-01 MED ORDER — ENOXAPARIN SODIUM 60 MG/0.6ML IJ SOSY
60.0000 mg | PREFILLED_SYRINGE | INTRAMUSCULAR | Status: DC
  Administered 2024-03-01 – 2024-03-04 (×5): 60 mg via SUBCUTANEOUS
  Filled 2024-03-01 (×4): qty 0.6

## 2024-03-01 MED ORDER — ATORVASTATIN CALCIUM 40 MG PO TABS
40.0000 mg | ORAL_TABLET | Freq: Every day | ORAL | Status: DC
  Administered 2024-03-02 – 2024-03-05 (×4): 40 mg via ORAL
  Filled 2024-03-01 (×4): qty 1

## 2024-03-01 NOTE — ED Triage Notes (Signed)
 Pt BIB [facility] from home c/o respiratory distress, increasing X 2 weeks. Arrives on neb tx. Chronic 3L [place] O2 use for COPD.  2 duoneb, 1 albuterol  2 g mag, 125 solumedrol  148/80 HR 90 SaO2 100% on neb

## 2024-03-01 NOTE — ED Notes (Signed)
 Pt was ambulated with 2 person assist and a walker for safety.  Pt stood up and tried to ambulate and became short of breath and her oxygen  sat dropped to 87% while on her nasal oxygen .  Pt stated she could not walk and pt moved back to her bed.

## 2024-03-01 NOTE — ED Provider Notes (Signed)
 [place] [facility] Provider Note   CSN: [id number] Arrival date & time: 03/01/24  1113     Patient presents with: Respiratory Distress   Pamela [name] is a 73 y.o. female with a history of CHF, COPD, hypertension, right lung upper lobe adenocarcinoma in remission, presents with concern for feeling short of breath this morning.  States she has been having intermittent shortness of breath and cough for the past week.  This morning upon waking, she felt significantly more short of breath.  She called EMS which treated her and route with 2 DuoNeb treatments, 1 albuterol , 2 g of magnesium , and 125 mg of Solu-Medrol .  Denies sore throat, fever, chills.  Denies any chest pain.  Denies any increase in lower extremity swelling.  Denies any pain in her lower extremities.   HPI     Prior to Admission medications   Medication Sig Start Date End Date Taking? Authorizing Provider  acetaminophen  (TYLENOL ) 500 MG tablet Take 1,000 mg by mouth every 6 (six) hours as needed for mild pain.    [provider]  albuterol  (PROAIR  HFA) 108 (90 Base) MCG/ACT inhaler Inhale 2 puffs into the lungs every 4 (four) hours as needed for wheezing or shortness of breath. 05/04/23   Parrett, Madelin RAMAN, NP  albuterol  (PROVENTIL ) (2.5 MG/3ML) 0.083% nebulizer solution INHALE 1 VIAL IN NEBULIZER EVERY 6 HOURS AS NEEDED FOR WHEEZING FOR SHORTNESS OF BREATH 01/10/24   Parrett, Madelin RAMAN, NP  aspirin  EC 81 MG tablet Take 1 tablet (81 mg total) by mouth daily. Swallow whole. 04/26/20   Chandrasekhar, Mahesh A, MD  atorvastatin  (LIPITOR) 40 MG tablet TAKE 1 TABLET EVERY DAY 12/02/23   Chandrasekhar, Mahesh A, MD  B COMPLEX, FOLIC ACID , PO Take 1 tablet by mouth daily.    [provider]  B Complex-C (B-COMPLEX WITH VITAMIN C) tablet Take 1 tablet by mouth daily. 03/04/23   Tobie Yetta HERO, MD  budesonide  (PULMICORT ) 0.25 MG/2ML nebulizer solution INHALE THE CONTENTS OF 1 VIAL VIA  NEBULIZER TWICE DAILY AS DIRECTED WITH ALBUTEROL  12/06/23   Darlean Ozell NOVAK, MD  calcitRIOL  (ROCALTROL ) 0.25 MCG capsule TAKE 1 CAPSULE EVERY DAY 09/24/23   Paseda, [name], FNP  cetirizine  (ZYRTEC ) 10 MG chewable tablet Chew 10 mg by mouth daily.    [provider]  Cholecalciferol (VITAMIN D3) 25 MCG (1000 UT) CAPS Take 1 capsule (1,000 Units total) by mouth daily. 01/10/24   Paseda, [name], FNP  dextromethorphan  (DELSYM ) 30 MG/5ML liquid Take 5 mLs (30 mg total) by mouth 2 (two) times daily. 03/03/23   Tobie Yetta HERO, MD  diclofenac  Sodium (VOLTAREN ) 1 % GEL Apply 2 g topically 4 (four) times daily. 03/03/23   Tobie Yetta HERO, MD  docusate sodium  (STOOL SOFTENER) 100 MG capsule Take 100 mg by mouth daily.    [provider]  ezetimibe  (ZETIA ) 10 MG tablet Take 1 tablet (10 mg total) by mouth daily. 03/30/23 02/24/24  Swinyer, Rosaline HERO, NP  famotidine  (PEPCID ) 20 MG tablet TAKE 1 TABLET AFTER SUPPER 07/06/23   [name], Michael B, MD  febuxostat  (ULORIC ) 40 MG tablet TAKE 1 TABLET EVERY DAY 05/06/23   Hollis, Lachina M, FNP  folic acid  (FOLVITE ) 1 MG tablet Take 1 tablet (1 mg total) by mouth daily. 03/04/23   Tobie Yetta HERO, MD  furosemide  (LASIX ) 40 MG tablet TAKE 1 TABLET EVERY DAY 07/06/23   Nichols, Tonya S, NP  hydrOXYzine  (ATARAX ) 25 MG tablet Take  1 tablet (25 mg total) by mouth 3 (three) times daily as needed for itching. 03/03/23   Tobie Yetta HERO, MD  Incontinence Supply Disposable (DEPEND UNDERWEAR LARGE/XL) MISC 1 each by Does not apply route 2 times daily at 12 noon and 4 pm. 07/28/22   Tilford Bertram HERO, FNP  montelukast  (SINGULAIR ) 10 MG tablet TAKE 1 TABLET AT BEDTIME 09/23/23   Parrett, [name] S, NP  pantoprazole  (PROTONIX ) 40 MG tablet TAKE 1 TABLET EVERY DAY 30 TO 60 MINUTES BEFORE FIRST MEAL OF THE DAY 09/23/23   Parrett, Madelin RAMAN, NP    Allergies: Sulfa  antibiotics    Review of Systems  Constitutional:  Negative for fever.  Respiratory:  Positive for cough and  shortness of breath.     Updated Vital Signs BP (!) 178/75   Pulse (!) 108   Temp 98.3 F (36.8 C) (Oral)   Resp 18   Ht 5' 4 (1.626 m)   Wt 133.8 kg   SpO2 90%   BMI 50.64 kg/m   Physical Exam Vitals and nursing note reviewed.  Constitutional:      General: She is not in acute distress.    Appearance: She is well-developed.  HENT:     Head: Normocephalic and atraumatic.  Eyes:     Conjunctiva/sclera: Conjunctivae normal.  Cardiovascular:     Rate and Rhythm: Normal rate and regular rhythm.     Heart sounds: No murmur heard. Pulmonary:     Effort: Pulmonary effort is normal. No respiratory distress.     Breath sounds: Normal breath sounds.     Comments: Wheezing diffusely in the upper and lower lobes.  Dry cough noted on exam  Talking in full sentences on baseline 3 L nasal cannula without difficulty. Abdominal:     Palpations: Abdomen is soft.     Tenderness: There is no abdominal tenderness.  Musculoskeletal:        General: No swelling.     Cervical back: Neck supple.     Comments: Edema of the bilateral lower extremities, patient states this is baseline.  No calf tenderness to palpation bilaterally  Skin:    General: Skin is warm and dry.     Capillary Refill: Capillary refill takes less than 2 seconds.  Neurological:     Mental Status: She is alert.  Psychiatric:        Mood and Affect: Mood normal.     (all labs ordered are listed, but only abnormal results are displayed) Labs Reviewed  RESP PANEL BY RT-PCR (RSV, FLU A&B, COVID)  RVPGX2 - Abnormal; Notable for the following components:      Result Value   SARS Coronavirus 2 by RT PCR POSITIVE (*)    All other components within normal limits  CBC - Abnormal; Notable for the following components:   RBC 5.12 (*)    Hemoglobin 11.4 (*)    MCV 76.2 (*)    MCH 22.3 (*)    MCHC 29.2 (*)    All other components within normal limits  COMPREHENSIVE METABOLIC PANEL WITH GFR - Abnormal; Notable for the  following components:   Chloride 96 (*)    Glucose, Bld 153 (*)    BUN 25 (*)    Creatinine, Ser 1.12 (*)    Alkaline Phosphatase 128 (*)    GFR, Estimated 53 (*)    All other components within normal limits  BRAIN NATRIURETIC PEPTIDE    EKG: EKG Interpretation Date/Time:  Wednesday March 01 2024 11:48:38  EDT Ventricular Rate:  83 PR Interval:  161 QRS Duration:  105 QT Interval:  368 QTC Calculation: 433 R Axis:   -81  Text Interpretation: Sinus rhythm Left anterior fascicular block Abnormal R-wave progression, late transition No significant change since last tracing Confirmed by Randol Simmonds [phone]) on 03/01/2024 11:53:34 AM  Radiology: [facility] Chest 2 View Result Date: 03/01/2024 CLINICAL DATA:  SOB , cough EXAM: CHEST - 2 VIEW COMPARISON:  September 21, 2023 FINDINGS: Fine reticular opacities throughout both lungs. No focal airspace consolidation or pleural effusion. No pneumothorax. No cardiomegaly. Cardiac loop recorder device. Tortuous aorta with aortic atherosclerosis. No acute fracture or destructive lesions. Multilevel thoracic osteophytosis. IMPRESSION: Fine reticular opacities throughout both lungs, worrisome for a atypical infection or viral pneumonia. Alternatively, interstitial edema could also have this appearance in the correct clinical context. Electronically Signed   By: Rogelia Myers M.D.   On: 03/01/2024 13:08     Procedures   Medications Ordered in the ED  ipratropium-albuterol  (DUONEB) 0.5-2.5 (3) MG/3ML nebulizer solution 3 mL (3 mLs Nebulization Given 03/01/24 1313)  benzonatate  (TESSALON ) capsule 200 mg (200 mg Oral Given by Other 03/01/24 1448)  acetaminophen  (TYLENOL ) tablet 1,000 mg (1,000 mg Oral Given 03/01/24 1448)    Clinical Course as of 03/01/24 2018  Wed Mar 01, 2024  1810 I was informed that Flavio and Sherlean the techs, said that pt ambulated and she could not walk, she desated to 69 on oxygen  so they assisted her back to bed.  [AF]  1858 Discussed  patient with Dr. Tobie, he recommends admission [AF]    Clinical Course User Index [AF] Veta Palma, PA-C                                 Medical Decision Making Amount and/or Complexity of Data Reviewed Labs: ordered. Radiology: ordered.  Risk OTC drugs. Prescription drug management. Decision regarding hospitalization.     Differential diagnosis includes but is not limited to COVID, flu, RSV, viral URI, strep pharyngitis, viral pharyngitis, allergic rhinitis, pneumonia, bronchitis   ED Course:  Upon initial evaluation, patient is chronically ill-appearing, but no acute distress.  Diffuse wheezing in the lungs bilaterally.  Also has dry cough on exam.  She is maintaining 98% on her normal 3 L nasal cannula.  Does have some lower extremity edema, but states this is baseline for her.  No tenderness of the calves bilaterally.  Low concern for PE at this time given no chest pain, no tachycardia, oxygen  saturation at normal, and symptoms seem to be more viral in nature due to the cough she has been having.  Labs Ordered: I Ordered, and personally interpreted labs.  The pertinent results include:  CBC without leukocytosis.  Hemoglobin slightly low at 11.4, appears to be at baseline CMP with mildly elevated creatinine 1.12, appears to be slight improved from baseline  COVID positive.  Flu and RSV negative BMP within normal limits  Imaging Studies ordered: I ordered imaging studies including chest x-ray I independently visualized the imaging with scope of interpretation limited to determining acute life threatening conditions related to emergency care.  IMPRESSION: Fine reticular opacities throughout both lungs, worrisome for a atypical infection or viral pneumonia. Alternatively, interstitial edema could also have this appearance in the correct clinical context. I agree with the radiologist interpretation   Cardiac Monitoring: / EKG: The patient was maintained on a  cardiac monitor.  I personally viewed  and interpreted the cardiac monitored which showed an underlying rhythm of: Normal sinus rhythm   Consultations Obtained: I requested consultation with the hospitalist Dr. Tobie,  and discussed lab and imaging findings as well as pertinent plan - they recommend: admission  Medications Given: Under 25 mg Solu-Medrol , 2 DuoNeb, 2 g magnesium  given by EMS prior to arrival 1 additional DuoNeb given in the ER Tessalon  Perles given for cough Tylenol  given for headache  Upon re-evaluation, patient reports headache has improved with Tylenol .  Reports she feels less short of breath.  However, upon ambulation, she dropped to 87% even while on her 3 L nasal cannula.  Felt very short of breath and did not feel comfortable going home.  Will reach out to hospitalist for admission    Impression: COVID-19  Disposition:  Admission with hospitalist Dr. Tobie     This chart was dictated using voice recognition software, Dragon. Despite the best efforts of this provider to proofread and correct errors, errors may still occur which can change documentation meaning.       Final diagnoses:  Shortness of breath  COVID-19    ED Discharge Orders     None          Veta Palma, PA-C 03/01/24 2018    Bari Roxie HERO, DO 03/01/24 2229

## 2024-03-01 NOTE — Hospital Course (Signed)
 Pamela [name] is a 73 y.o. female with medical history significant for moderate persistent asthma, chronic respiratory failure with hypoxia on 3 L O2 [place], chronic HFpEF, RUL adenocarcinoma s/p SBRT, sarcoidosis, HTN, HLD, CKD stage IIIb, OSA/OHS on BiPAP who is admitted with acute on chronic hypoxic respiratory failure due to asthma exacerbation in setting of COVID-19 viral infection.

## 2024-03-01 NOTE — Progress Notes (Signed)
 Patient has home medications that are kept by pharmacy.

## 2024-03-01 NOTE — Group Note (Deleted)
 Date:  03/01/2024 Time:  2:22 PM  Group Topic/Focus:  Wellness Toolbox:   The focus of this group is to discuss various aspects of wellness, balancing those aspects and exploring ways to increase the ability to experience wellness.  Patients will create a wellness toolbox for use upon discharge.     Participation Level:  {BHH PARTICIPATION OZCZO:77735}  Participation Quality:  {BHH PARTICIPATION QUALITY:22265}  Affect:  {BHH AFFECT:22266}  Cognitive:  {BHH COGNITIVE:22267}  Insight: {BHH Insight2:20797}  Engagement in Group:  {BHH ENGAGEMENT IN HMNLE:77731}  Modes of Intervention:  {BHH MODES OF INTERVENTION:22269}  Additional Comments:  ***  Pamela [name] 03/01/2024, 2:22 PM

## 2024-03-01 NOTE — H&P (Signed)
 History and Physical    Pamela [name] FMW:[id number] DOB: Nov 27, 1950 DOA: 03/01/2024  PCP: Paseda, [name], FNP  Patient coming from: Home  I have personally briefly reviewed patient's old medical records in [facility]  Chief Complaint: Shortness of breath  HPI: Pamela [name] is a 73 y.o. female with medical history significant for moderate persistent asthma, chronic respiratory failure with hypoxia on 3 L O2 [place], chronic HFpEF, RUL adenocarcinoma s/p SBRT, sarcoidosis, HTN, HLD, CKD stage IIIb, OSA/OHS on BiPAP who presented to the ED for evaluation of shortness of breath.  Patient reports 2 weeks of progressive shortness of breath and nonproductive cough.  She felt significantly more short of breath this morning when she awoke.  She has not had fevers, chills, diaphoresis, chest pain.  EMS were called.  Patient was given 2 DuoNeb treatment, 1 albuterol , 2 g magnesium , 125 mg Solu-Medrol  en route to the ED.  ED Course  Labs/Imaging on admission: I have personally reviewed following labs and imaging studies.  Initial vitals showed BP 152/72, pulse 85, RR 25, temp 97.8 F, SpO2 100% on 3L O2 [place].  Labs showed WBC 5.9, hemoglobin 11.4, platelets 151, sodium 143, potassium 3.8, bicarb 32, BUN 25, creatinine 1.12, serum glucose 153, BNP 58.8.  SARS-CoV-2 PCR positive.  Influenza A/B, RSV PCR negative.  2 view chest x-ray showed fine reticular opacities throughout both lungs.  Patient was given DuoNeb treatment.  SPO2 dropped to 87% with ambulation while on home 3 O2 .  The hospitalist service was consulted to admit.  Review of Systems: All systems reviewed and are negative except as documented in history of present illness above.   Past Medical History:  Diagnosis Date   Arthritis    Asthma    Cancer (HCC)    lung, adenocarcinoma right lung 2012   CHF (congestive heart failure) (HCC)    Preserved EF   Congenital single kidney    With chronic kidney disease   COPD  (chronic obstructive pulmonary disease) (HCC)    Gout    Hypertension    Lung cancer (HCC) 2012   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 Patient notes it was consistent with sarcoidosis but actual pathology results not available.   Oxygen  deficiency    Sarcoidosis    Sickle cell trait (HCC)    Sleep apnea     Past Surgical History:  Procedure Laterality Date   LOOP RECORDER INSERTION N/A 09/08/2018   Procedure: LOOP RECORDER INSERTION;  Surgeon: Waddell Danelle ORN, MD;  Location: [facility] INVASIVE CV LAB;  Service: Cardiovascular;  Laterality: N/A;   LUNG BIOPSY     TEE WITHOUT CARDIOVERSION N/A 09/08/2018   Procedure: TRANSESOPHAGEAL ECHOCARDIOGRAM (TEE);  Surgeon: Delford Maude BROCKS, MD;  Location: [facility] ENDOSCOPY;  Service: Cardiovascular;  Laterality: N/A;  with loop   TUBAL LIGATION     VEIN LIGATION AND STRIPPING      Social History: Social History   Tobacco Use   Smoking status: Never   Smokeless tobacco: Never  Vaping Use   Vaping status: Never Used  Substance Use Topics   Drug use: No   Allergies  Allergen Reactions   Sulfa  Antibiotics Hives and Rash    Family History  Problem Relation Age of Onset   Hypertension Mother    Renal Disease  Mother    High blood pressure Mother    Heart disease Mother    Cancer Father        stomach   Heart disease Father        No details   Cervical cancer Sister    Diabetes Sister    Multiple myeloma Sister    Cancer Brother    Diabetes Brother      Prior to Admission medications   Medication Sig Start Date End Date Taking? Authorizing Provider  acetaminophen  (TYLENOL ) 500 MG tablet Take 1,000 mg by mouth every 6 (six) hours as needed for mild pain.    [provider]  albuterol  (PROAIR  HFA) 108 (90 Base) MCG/ACT inhaler Inhale 2 puffs into the  lungs every 4 (four) hours as needed for wheezing or shortness of breath. 05/04/23   Parrett, [name] S, NP  albuterol  (PROVENTIL ) (2.5 MG/3ML) 0.083% nebulizer solution INHALE 1 VIAL IN NEBULIZER EVERY 6 HOURS AS NEEDED FOR WHEEZING FOR SHORTNESS OF BREATH 01/10/24   Parrett, Madelin RAMAN, NP  aspirin  EC 81 MG tablet Take 1 tablet (81 mg total) by mouth daily. Swallow whole. 04/26/20   Chandrasekhar, Stanly LABOR, MD  atorvastatin  (LIPITOR) 40 MG tablet TAKE 1 TABLET EVERY DAY 12/02/23   Chandrasekhar, Mahesh A, MD  B COMPLEX, FOLIC ACID , PO Take 1 tablet by mouth daily.    [provider]  B Complex-C (B-COMPLEX WITH VITAMIN C) tablet Take 1 tablet by mouth daily. 03/04/23   Tobie Yetta HERO, MD  budesonide  (PULMICORT ) 0.25 MG/2ML nebulizer solution INHALE THE CONTENTS OF 1 VIAL VIA NEBULIZER TWICE DAILY AS DIRECTED WITH ALBUTEROL  12/06/23   Darlean Ozell NOVAK, MD  calcitRIOL  (ROCALTROL ) 0.25 MCG capsule TAKE 1 CAPSULE EVERY DAY 09/24/23   Paseda, [name], FNP  cetirizine  (ZYRTEC ) 10 MG chewable tablet Chew 10 mg by mouth daily.    [provider]  Cholecalciferol (VITAMIN D3) 25 MCG (1000 UT) CAPS Take 1 capsule (1,000 Units total) by mouth daily. 01/10/24   Paseda, [name], FNP  dextromethorphan  (DELSYM ) 30 MG/5ML liquid Take 5 mLs (30 mg total) by mouth 2 (two) times daily. 03/03/23   Tobie Yetta HERO, MD  diclofenac  Sodium (VOLTAREN ) 1 % GEL Apply 2 g topically 4 (four) times daily. 03/03/23   Tobie Yetta HERO, MD  docusate sodium  (STOOL SOFTENER) 100 MG capsule Take 100 mg by mouth daily.    [provider]  ezetimibe  (ZETIA ) 10 MG tablet Take 1 tablet (10 mg total) by mouth daily. 03/30/23 02/24/24  Swinyer, Rosaline HERO, NP  famotidine  (PEPCID ) 20 MG tablet TAKE 1 TABLET AFTER SUPPER 07/06/23   [name], Michael B, MD  febuxostat  (ULORIC ) 40 MG tablet TAKE 1 TABLET EVERY DAY 05/06/23   Hollis, Lachina M, FNP  folic acid  (FOLVITE ) 1 MG tablet Take 1 tablet (1 mg total) by mouth daily. 03/04/23    Tobie Yetta HERO, MD  furosemide  (LASIX ) 40 MG tablet TAKE 1 TABLET EVERY DAY 07/06/23   Nichols, Tonya S, NP  hydrOXYzine  (ATARAX ) 25 MG tablet Take 1 tablet (25 mg total) by mouth 3 (three) times daily as needed for itching. 03/03/23   Tobie Yetta HERO, MD  Incontinence Supply Disposable (DEPEND UNDERWEAR LARGE/XL) MISC 1 each by Does not apply route 2 times daily at 12 noon and 4 pm. 07/28/22   Tilford Bertram HERO, FNP  montelukast  (SINGULAIR ) 10 MG tablet TAKE 1 TABLET AT BEDTIME 09/23/23   Parrett, [name] S, NP  pantoprazole  (PROTONIX ) 40 MG  tablet TAKE 1 TABLET EVERY DAY 30 TO 60 MINUTES BEFORE FIRST MEAL OF THE DAY 09/23/23   Orlie Madelin RAMAN, NP    Physical Exam: Vitals:   03/01/24 1535 03/01/24 1715 03/01/24 2100 03/01/24 2112  BP:  (!) 178/75  134/70  Pulse:  (!) 108  83  Resp:  18  16  Temp: 98.3 F (36.8 C)   98.8 F (37.1 C)  TempSrc: Oral     SpO2:  90%  99%  Weight:   128.6 kg   Height:   5' 4 (1.626 m)    Constitutional: Obese woman resting in bed with head elevated.  Appears fatigued, calm. Eyes:  lids and conjunctivae normal ENMT: Mucous membranes are moist. Posterior pharynx clear of any exudate or lesions.Normal dentition.  Neck: normal, supple, no masses. Respiratory: distant breath sounds with faint end expiratory wheezing. Normal respiratory effort while on 3 L O2 [place]. No accessory muscle use.  Cardiovascular: Regular rate and rhythm, systolic murmur. No pitting edema of lower extremities. 2+ pedal pulses. Abdomen: no tenderness, no masses palpated. Musculoskeletal: no clubbing / cyanosis. No joint deformity upper and lower extremities.  Skin: Stasis dermatitis changes lower extremities Neurologic: Sensation intact. Strength 5/5 in all 4.  Psychiatric: Alert and oriented x 3. Normal mood.   EKG: Personally reviewed. Sinus rhythm, rate 83, LAFB, no acute ischemic changes.  Not significantly changed from prior.  Assessment/Plan Principal Problem:   Acute on chronic  respiratory failure with hypoxia (HCC) Active Problems:   Moderate persistent asthma with acute exacerbation   COVID-19 virus infection   Anemia of chronic disease   Chronic heart failure with preserved ejection fraction (HFpEF) (HCC)   Obstructive sleep apnea   Stage 3b chronic kidney disease (HCC)   Pamela [name] is a 74 y.o. female with medical history significant for moderate persistent asthma, chronic respiratory failure with hypoxia on 3 L O2 [place], chronic HFpEF, RUL adenocarcinoma s/p SBRT, sarcoidosis, HTN, HLD, CKD stage IIIb, OSA/OHS on BiPAP who is admitted with acute on chronic hypoxic respiratory failure due to asthma exacerbation in setting of COVID-19 viral infection.  Assessment and Plan: Acute on chronic hypoxic respiratory failure Moderate persistent asthma with acute exacerbation COVID-19 viral infection: SpO2 down to 87% with ambulation while on home 3L O2 [place].  COVID-19 PCR positive.  CXR with likely associated viral pneumonia. - IV Solu-Medrol  40 mg twice daily - Pulmicort /Brovana  bid, DuoNeb as needed - Continue Singulair  - Continue supplemental O2 - Continue BiPAP nightly  Chronic HFpEF: Appears compensated. Last EF 60-65% by TTE 08/17/2022.  Continue home Lasix  40 mg daily.  CKD stage IIIb: Creatinine actually improved from baseline.  Continue to monitor.  Anemia of chronic disease: Hemoglobin stable.  Hypertension: Does not appear to be on antihypertensives at baseline.  IV hydralazine  ordered as needed.  Hyperlipidemia: Continue atorvastatin , Zetia .  Gout: Continue febuxostat .  OSA/OHS: Continue BiPAP nightly.   DVT prophylaxis:  Code Status: Full code Family Communication: None present on admission Disposition Plan: From home, dispo pending clinical progress Consults called: None Severity of Illness: The appropriate patient status for this patient is OBSERVATION. Observation status is judged to be reasonable and necessary in order to provide  the required intensity of service to ensure the patient's safety. The patient's presenting symptoms, physical exam findings, and initial radiographic and laboratory data in the context of their medical condition is felt to place them at decreased risk for further clinical deterioration. Furthermore, it is anticipated that the patient will  be medically stable for discharge from the hospital within 2 midnights of admission.   Pamela Blanch MD [facility]  If 7PM-7AM, please contact night-coverage [email or web address]  03/01/2024, 9:14 PM

## 2024-03-02 DIAGNOSIS — J4489 Other specified chronic obstructive pulmonary disease: Secondary | ICD-10-CM | POA: Diagnosis present

## 2024-03-02 DIAGNOSIS — J4541 Moderate persistent asthma with (acute) exacerbation: Secondary | ICD-10-CM | POA: Diagnosis present

## 2024-03-02 DIAGNOSIS — U071 COVID-19: Secondary | ICD-10-CM | POA: Diagnosis present

## 2024-03-02 DIAGNOSIS — J9621 Acute and chronic respiratory failure with hypoxia: Secondary | ICD-10-CM | POA: Diagnosis not present

## 2024-03-02 DIAGNOSIS — J9612 Chronic respiratory failure with hypercapnia: Secondary | ICD-10-CM | POA: Diagnosis not present

## 2024-03-02 DIAGNOSIS — Z6841 Body Mass Index (BMI) 40.0 and over, adult: Secondary | ICD-10-CM | POA: Diagnosis not present

## 2024-03-02 DIAGNOSIS — J1282 Pneumonia due to coronavirus disease 2019: Secondary | ICD-10-CM | POA: Diagnosis present

## 2024-03-02 DIAGNOSIS — D631 Anemia in chronic kidney disease: Secondary | ICD-10-CM | POA: Diagnosis present

## 2024-03-02 DIAGNOSIS — I444 Left anterior fascicular block: Secondary | ICD-10-CM | POA: Diagnosis present

## 2024-03-02 DIAGNOSIS — E662 Morbid (severe) obesity with alveolar hypoventilation: Secondary | ICD-10-CM | POA: Diagnosis present

## 2024-03-02 DIAGNOSIS — D573 Sickle-cell trait: Secondary | ICD-10-CM | POA: Diagnosis present

## 2024-03-02 DIAGNOSIS — J449 Chronic obstructive pulmonary disease, unspecified: Secondary | ICD-10-CM | POA: Diagnosis not present

## 2024-03-02 DIAGNOSIS — Z8049 Family history of malignant neoplasm of other genital organs: Secondary | ICD-10-CM | POA: Diagnosis not present

## 2024-03-02 DIAGNOSIS — Z8249 Family history of ischemic heart disease and other diseases of the circulatory system: Secondary | ICD-10-CM | POA: Diagnosis not present

## 2024-03-02 DIAGNOSIS — Z807 Family history of other malignant neoplasms of lymphoid, hematopoietic and related tissues: Secondary | ICD-10-CM | POA: Diagnosis not present

## 2024-03-02 DIAGNOSIS — D869 Sarcoidosis, unspecified: Secondary | ICD-10-CM | POA: Diagnosis present

## 2024-03-02 DIAGNOSIS — I5032 Chronic diastolic (congestive) heart failure: Secondary | ICD-10-CM | POA: Diagnosis present

## 2024-03-02 DIAGNOSIS — I130 Hypertensive heart and chronic kidney disease with heart failure and stage 1 through stage 4 chronic kidney disease, or unspecified chronic kidney disease: Secondary | ICD-10-CM | POA: Diagnosis present

## 2024-03-02 DIAGNOSIS — N1832 Chronic kidney disease, stage 3b: Secondary | ICD-10-CM | POA: Diagnosis present

## 2024-03-02 DIAGNOSIS — M109 Gout, unspecified: Secondary | ICD-10-CM | POA: Diagnosis present

## 2024-03-02 DIAGNOSIS — R0602 Shortness of breath: Secondary | ICD-10-CM | POA: Diagnosis present

## 2024-03-02 DIAGNOSIS — Z841 Family history of disorders of kidney and ureter: Secondary | ICD-10-CM | POA: Diagnosis not present

## 2024-03-02 DIAGNOSIS — J440 Chronic obstructive pulmonary disease with acute lower respiratory infection: Secondary | ICD-10-CM | POA: Diagnosis present

## 2024-03-02 DIAGNOSIS — Z79899 Other long term (current) drug therapy: Secondary | ICD-10-CM | POA: Diagnosis not present

## 2024-03-02 DIAGNOSIS — Z833 Family history of diabetes mellitus: Secondary | ICD-10-CM | POA: Diagnosis not present

## 2024-03-02 DIAGNOSIS — Z7951 Long term (current) use of inhaled steroids: Secondary | ICD-10-CM | POA: Diagnosis not present

## 2024-03-02 DIAGNOSIS — E785 Hyperlipidemia, unspecified: Secondary | ICD-10-CM | POA: Diagnosis present

## 2024-03-02 LAB — BASIC METABOLIC PANEL WITH GFR
Anion gap: 8 (ref 5–15)
BUN: 26 mg/dL — ABNORMAL HIGH (ref 8–23)
CO2: 34 mmol/L — ABNORMAL HIGH (ref 22–32)
Calcium: 8.8 mg/dL — ABNORMAL LOW (ref 8.9–10.3)
Chloride: 99 mmol/L (ref 98–111)
Creatinine, Ser: 1.28 mg/dL — ABNORMAL HIGH (ref 0.44–1.00)
GFR, Estimated: 45 mL/min — ABNORMAL LOW (ref >60)
Glucose, Bld: 113 mg/dL — ABNORMAL HIGH (ref 70–99)
Potassium: 4.7 mmol/L (ref 3.5–5.1)
Sodium: 141 mmol/L (ref 135–145)

## 2024-03-02 LAB — CBC
HCT: 34.7 % — ABNORMAL LOW (ref 36.0–46.0)
Hemoglobin: 10 g/dL — ABNORMAL LOW (ref 12.0–15.0)
MCH: 21.7 pg — ABNORMAL LOW (ref 26.0–34.0)
MCHC: 28.8 g/dL — ABNORMAL LOW (ref 30.0–36.0)
MCV: 75.4 fL — ABNORMAL LOW (ref 80.0–100.0)
Platelets: 153 K/uL (ref 150–400)
RBC: 4.6 MIL/uL (ref 3.87–5.11)
RDW: 14.6 % (ref 11.5–15.5)
WBC: 5.3 K/uL (ref 4.0–10.5)
nRBC: 0 % (ref 0.0–0.2)

## 2024-03-02 MED ORDER — CALCITRIOL 0.25 MCG PO CAPS
0.2500 ug | ORAL_CAPSULE | Freq: Every day | ORAL | Status: DC
  Administered 2024-03-02 – 2024-03-05 (×4): 0.25 ug via ORAL
  Filled 2024-03-02 (×4): qty 1

## 2024-03-02 MED ORDER — ALBUTEROL SULFATE (2.5 MG/3ML) 0.083% IN NEBU
2.5000 mg | INHALATION_SOLUTION | Freq: Four times a day (QID) | RESPIRATORY_TRACT | Status: DC
  Administered 2024-03-02 – 2024-03-03 (×6): 2.5 mg via RESPIRATORY_TRACT
  Filled 2024-03-02 (×6): qty 3

## 2024-03-02 MED ORDER — ASPIRIN 81 MG PO TBEC
81.0000 mg | DELAYED_RELEASE_TABLET | Freq: Every day | ORAL | Status: DC
  Administered 2024-03-02 – 2024-03-05 (×4): 81 mg via ORAL
  Filled 2024-03-02 (×4): qty 1

## 2024-03-02 MED ORDER — DM-GUAIFENESIN ER 30-600 MG PO TB12
1.0000 | ORAL_TABLET | Freq: Two times a day (BID) | ORAL | Status: DC
  Administered 2024-03-02 – 2024-03-05 (×7): 1 via ORAL
  Filled 2024-03-02 (×7): qty 1

## 2024-03-02 MED ORDER — HYDROCOD POLI-CHLORPHE POLI ER 10-8 MG/5ML PO SUER
5.0000 mL | Freq: Two times a day (BID) | ORAL | Status: DC
  Administered 2024-03-02 – 2024-03-05 (×7): 5 mL via ORAL
  Filled 2024-03-02 (×7): qty 115

## 2024-03-02 MED ORDER — BENZONATATE 100 MG PO CAPS
100.0000 mg | ORAL_CAPSULE | Freq: Three times a day (TID) | ORAL | Status: DC
  Administered 2024-03-02 – 2024-03-05 (×11): 100 mg via ORAL
  Filled 2024-03-02 (×11): qty 1

## 2024-03-02 NOTE — Care Management Obs Status (Signed)
 MEDICARE OBSERVATION STATUS NOTIFICATION   Patient Details  Name: Pamela [name] MRN: [id number] Date of Birth: 1951-01-18   Medicare Observation Status Notification Given:  Yes    Sonda Manuella Quill, RN 03/02/2024, 1:52 PM

## 2024-03-02 NOTE — Plan of Care (Signed)
  Problem: Education: Goal: Knowledge of risk factors and measures for prevention of condition will improve Outcome: Progressing   Problem: Respiratory: Goal: Will maintain a patent airway Outcome: Progressing Goal: Complications related to the disease process, condition or treatment will be avoided or minimized Outcome: Progressing   Problem: Education: Goal: Knowledge of General Education information will improve Description: Including pain rating scale, medication(s)/side effects and non-pharmacologic comfort measures Outcome: Progressing   Problem: Clinical Measurements: Goal: Will remain free from infection Outcome: Progressing Goal: Diagnostic test results will improve Outcome: Progressing Goal: Respiratory complications will improve Outcome: Progressing   Problem: Nutrition: Goal: Adequate nutrition will be maintained Outcome: Progressing

## 2024-03-02 NOTE — Progress Notes (Signed)
 [facility] Progress Note  Patient: Pamela [name]     FMW:[id number]  DOA: 03/01/2024   PCP: [name], Pamela R, FNP       HPI: Pamela [name] is a 73 y.o. female with medical history significant for moderate persistent asthma, chronic respiratory failure with hypoxia on 3 L O2 [place], chronic HFpEF, RUL adenocarcinoma s/p SBRT, sarcoidosis, HTN, HLD, CKD stage IIIb, OSA/OHS on BiPAP who presented to the ED for evaluation of shortness of breath.   Patient reports 2 weeks of progressive shortness of breath and nonproductive cough.  She felt significantly more short of breath this morning when she awoke.  She has not had fevers, chills, diaphoresis, chest pain.   EMS were called.  Patient was given 2 DuoNeb treatment, 1 albuterol , 2 g magnesium , 125 mg Solu-Medrol  en route to the ED.  Subjective:  Still has significant cough and dyspnea today. Has a headache which is worse when she coughs.   Assessment and Plan: Principal Problem:      Acute on chronic hypoxic respiratory failure Moderate persistent asthma with acute exacerbation COVID-19 viral infection: SpO2 down to 87% with ambulation while on home 3L O2 .  COVID-19 PCR positive.  - CXR > Fine reticular opacities throughout both lungs  - cont IV Solu-Medrol  40 mg twice daily - Pulmicort /Brovana  bid, DuoNeb as needed - Continue Singulair  - Continue supplemental O2 - Cough is still quite severe-have added Tessalon , Robitussin DM and Tussionex   Chronic HFpEF: Appears compensated. Last EF 60-65% by TTE 08/17/2022.  Continue home Lasix  40 mg daily.   CKD stage IIIb: Creatinine actually improved from baseline.  Continue to monitor.   Microcytic anemia Iron  panel checked in June did not reveal iron  deficiency - she has had folate deficiency in the past (5.3 in 8/24)- will check folate level   Hypertension: Does not appear to be on antihypertensives at baseline.  IV hydralazine  ordered as needed.    Hyperlipidemia: Continue atorvastatin , Zetia .   Gout: Continue febuxostat .   OSA/OHS: Continue BiPAP nightly.    Code Status: Full Code Total time on patient care: 35 min DVT prophylaxis:  lovenox    Objective:   Vitals:   03/02/24 1319 03/02/24 1435 03/02/24 1900 03/02/24 1907  BP: (!) 155/75   (!) 108/40  Pulse: 73   76  Resp: (!) 22   16  Temp: 98.2 F (36.8 C)  98.3 F (36.8 C)   TempSrc:   Oral   SpO2: 99% 100%  99%  Weight:      Height:       Filed Weights   03/01/24 1138 03/01/24 2100  Weight: 133.8 kg 128.6 kg   Exam: General exam: Appears comfortable  HEENT: oral mucosa moist Respiratory system: rhonchi, wheezing and poor air movement bilaterally, cough Cardiovascular system: S1 & S2 heard  Gastrointestinal system: Abdomen soft, non-tender, nondistended. Normal bowel sounds   Extremities: No cyanosis, clubbing or edema Psychiatry:  Mood & affect appropriate.      CBC: Recent Labs  Lab 03/01/24 1434 03/02/24 0632  WBC 5.9 5.3  HGB 11.4* 10.0*  HCT 39.0 34.7*  MCV 76.2* 75.4*  PLT 151 153   Basic Metabolic Panel: Recent Labs  Lab 03/01/24 1434 03/02/24 0510  NA 143 141  K 3.8 4.7  CL 96* 99  CO2 32 34*  GLUCOSE 153* 113*  BUN 25* 26*  CREATININE 1.12* 1.28*  CALCIUM  9.3 8.8*     Scheduled Meds:  albuterol   2.5 mg Nebulization Q6H  arformoterol   15 mcg Nebulization BID   aspirin  EC  81 mg Oral Daily   atorvastatin   40 mg Oral Daily   benzonatate   100 mg Oral TID   budesonide  (PULMICORT ) nebulizer solution  0.25 mg Nebulization BID   calcitRIOL   0.25 mcg Oral Daily   chlorpheniramine-HYDROcodone   5 mL Oral Q12H   dextromethorphan -guaiFENesin   1 tablet Oral BID   enoxaparin  (LOVENOX ) injection  60 mg Subcutaneous Q24H   ezetimibe   10 mg Oral Daily   famotidine   20 mg Oral Q supper   febuxostat   40 mg Oral Daily   furosemide   40 mg Oral Daily   methylPREDNISolone  (SOLU-MEDROL ) injection  40 mg Intravenous Q12H   montelukast    10 mg Oral QHS   pantoprazole   40 mg Oral QAC breakfast    Imaging and lab data personally reviewed   Author: [employer]  03/02/2024 7:25 PM  To contact [facility]>   Check the care team in [facility] and look for the attending/consulting TR[facility] provider listed  Log into [email or web address] and use [place]'s universal password   Go to> [facility]  and find provider  If you still have difficulty reaching the provider, please page the [facility] (Director on Call) for the Hospitalists listed on amion

## 2024-03-02 NOTE — TOC Initial Note (Signed)
 Transition of Care [facility]) - Initial/Assessment Note    Patient Details  Name: Pamela [name] MRN: [id number] Date of Birth: 12-Apr-1951  [facility]) CM/SW Contact:    Sonda Manuella Quill, RN Phone Number: 03/02/2024, 3:42 PM  Clinical Narrative:                 [place] w/ pt and son Tymia Streb [phone]) in room; pt said she lives at home w/ family; she plans to return at d/c; her son will provide transport; pt verified insurance/PCP; she denied SDOH risks; pt has cane, and bipap; she has home oxygen  w/ [employer]; pt said she has full travel tank; she does not have HH services; TOC following.  Expected Discharge Plan: Home/Self Care Barriers to Discharge: Continued Medical Work up   Patient Goals and CMS Choice Patient states their goals for this hospitalization and ongoing recovery are:: home CMS Medicare.gov Compare Post Acute Care list provided to:: Patient   [place] ownership interest in [facility].provided to:: Patient    Expected Discharge Plan and Services   Discharge Planning Services: CM Consult   Living arrangements for the past 2 months: Single Family Home                                      Prior Living Arrangements/Services Living arrangements for the past 2 months: Single Family Home Lives with:: Adult Children Patient language and need for interpreter reviewed:: Yes Do you feel safe going back to the place where you live?: Yes      Need for Family Participation in Patient Care: Yes (Comment) Care giver support system in place?: Yes (comment) Current home services: DME Criminal Activity/Legal Involvement Pertinent to Current Situation/Hospitalization: No - Comment as needed  Activities of Daily Living   ADL Screening (condition at time of admission) Independently performs ADLs?: Yes (appropriate for developmental age) Is the patient deaf or have difficulty hearing?: No Does the patient have difficulty seeing, even  when wearing glasses/contacts?: No Does the patient have difficulty concentrating, remembering, or making decisions?: No  Permission Sought/Granted Permission sought to share information with : Case Manager Permission granted to share information with : Yes, Verbal Permission Granted  Share Information with NAME: Case Manager     Permission granted to share info w Relationship: Latrice Storlie (son) [phone]     Emotional Assessment Appearance:: Appears stated age Attitude/Demeanor/Rapport: Gracious Affect (typically observed): Accepting Orientation: : Oriented to Self, Oriented to Place, Oriented to  Time, Oriented to Situation Alcohol / Substance Use: Not Applicable Psych Involvement: No (comment)  Admission diagnosis:  Shortness of breath [R06.02] Acute on chronic respiratory failure with hypoxia (HCC) [J96.21] COVID-19 [U07.1] Patient Active Problem List   Diagnosis Date Noted   Influenza vaccine needed 07/28/2023   Bilateral leg edema 07/28/2023   Dyslipidemia 07/28/2023   Urinary incontinence 07/28/2023   Macromastia 07/28/2023   Chronic kidney disease 02/25/2023   Severe asthma with exacerbation 02/25/2023   Moderate persistent asthma with acute exacerbation 02/24/2023   Tracheomalacia 02/24/2023   Acute on chronic respiratory failure with hypoxia (HCC) 02/24/2023   Acute on chronic respiratory acidosis (HCC) 02/23/2023   Vocal cord dysfunction 01/29/2023   Tracheomalacia, 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   Severe persistent chronic asthma without complication 09/08/2022   Sickle-cell trait (HCC) 09/08/2022   Acute respiratory failure with hypercapnia (HCC) 08/17/2022   COPD exacerbation (HCC) 04/04/2022   Vitamin D   deficiency 02/02/2022   Stage 3b chronic kidney disease (HCC) 09/24/2020   Malignant neoplasm of lung (HCC) 09/24/2020   Aortic atherosclerosis (HCC) 09/24/2020   PFO (patent foramen ovale) 09/24/2020   History of stroke 09/24/2020   Acute respiratory failure  with hypoxia (HCC) 12/17/2019   COPD with acute exacerbation (HCC) 12/17/2019   COVID-19 virus infection 01/02/2019   Right knee pain 09/15/2018   Closed head injury with concussion 09/06/2018   Hypokalemia 09/06/2018   Hypomagnesemia 09/06/2018   Asthma, chronic obstructive, with acute exacerbation (HCC) 08/02/2018   Plantar fasciitis 04/14/2017   Microcytosis 11/28/2015   Solitary kidney 07/18/2015   Onychomycosis 01/09/2015   Ingrown nail 01/09/2015   Pain in lower limb 01/09/2015   Chronic respiratory failure with hypoxia and hypercapnia (HCC) 07/19/2014   Acute on chronic respiratory failure with hypoxia and hypercapnia (HCC) 06/24/2014   Anemia, iron  deficiency 05/27/2014   Acute gouty arthritis 05/25/2014   Obstructive sleep apnea 05/25/2014   Joint pain 05/24/2014   Chronic heart failure with preserved ejection fraction (HFpEF) (HCC)    Sarcoidosis    Metabolic syndrome 05/21/2014   Asthma, chronic 04/27/2014   Anemia of chronic disease 04/19/2014   Morbid obesity (HCC) 04/18/2014   Immunization due 04/18/2014   Need for prophylactic vaccination and inoculation against influenza 04/18/2014   Prediabetes 04/13/2014   Essential hypertension 04/13/2014   Lower extremity edema 04/13/2014   History of lung cancer 04/13/2014   PCP:  Paseda, [name], FNP Pharmacy:   [facility] - [place], [place] - [address] [place] [address] Phone: [phone] Fax: [phone]  [facility] [address] [place]), [place] - [address] [place] ([name]) [place] [address] Phone: [phone] Fax: [phone]     Social Drivers of Health (SDOH) Social History: SDOH Screenings   Food Insecurity: No Food Insecurity (03/02/2024)  Housing: Low Risk  (03/02/2024)  Transportation Needs: No Transportation Needs (03/02/2024)  Utilities: Not At Risk (03/02/2024)  Alcohol Screen: Low Risk  (02/24/2024)  Depression  (PHQ2-9): Low Risk  (02/24/2024)  Financial Resource Strain: Medium Risk (02/24/2024)  Physical Activity: Insufficiently Active (02/24/2024)  Social Connections: Moderately Integrated (03/01/2024)  Stress: No Stress Concern Present (02/24/2024)  Tobacco Use: Low Risk  (03/01/2024)  Health Literacy: Adequate Health Literacy (07/22/2023)   SDOH Interventions: Food Insecurity Interventions: Intervention Not Indicated, Inpatient TOC Housing Interventions: Intervention Not Indicated, Inpatient TOC Transportation Interventions: Intervention Not Indicated, Inpatient TOC Utilities Interventions: Intervention Not Indicated, Inpatient TOC Social Connections Interventions: Intervention Not Indicated   Readmission Risk Interventions    02/26/2023    6:27 PM 04/06/2022   12:12 PM  Readmission Risk Prevention Plan  Transportation Screening Complete Complete  PCP or Specialist Appt within 5-7 Days  Complete  Home Care Screening  Complete  Medication Review (RN CM)  Complete  Medication Review (RN Care Manager) Complete   PCP or Specialist appointment within 3-5 days of discharge Complete   HRI or Home Care Consult Complete   SW Recovery Care/Counseling Consult Complete   Palliative Care Screening Not Applicable   Skilled Nursing Facility Not Applicable

## 2024-03-02 NOTE — Plan of Care (Signed)

## 2024-03-03 DIAGNOSIS — J9621 Acute and chronic respiratory failure with hypoxia: Secondary | ICD-10-CM | POA: Diagnosis not present

## 2024-03-03 LAB — FOLATE: Folate: 10.6 ng/mL (ref >5.9)

## 2024-03-03 MED ORDER — ALBUTEROL SULFATE (2.5 MG/3ML) 0.083% IN NEBU
2.5000 mg | INHALATION_SOLUTION | Freq: Three times a day (TID) | RESPIRATORY_TRACT | Status: DC
  Administered 2024-03-04 – 2024-03-05 (×5): 2.5 mg via RESPIRATORY_TRACT
  Filled 2024-03-03 (×5): qty 3

## 2024-03-03 MED ORDER — BISACODYL 10 MG RE SUPP
10.0000 mg | Freq: Once | RECTAL | Status: AC
  Administered 2024-03-03: 10 mg via RECTAL
  Filled 2024-03-03: qty 1

## 2024-03-03 NOTE — Progress Notes (Signed)
 [facility] Progress Note  Patient: Pamela [name]     FMW:[id number]  DOA: 03/01/2024   PCP: Paseda, [name], FNP       HPI: Pamela [name] is a 73 y.o. female with medical history significant for moderate persistent asthma, chronic respiratory failure with hypoxia on 3 L O2 [place], chronic HFpEF, RUL adenocarcinoma s/p SBRT, sarcoidosis, HTN, HLD, CKD stage IIIb, OSA/OHS on BiPAP who presented to the ED for evaluation of shortness of breath.   Patient reports 2 weeks of progressive shortness of breath and nonproductive cough.  She felt significantly more short of breath this morning when she awoke.  She has not had fevers, chills, diaphoresis, chest pain.   EMS were called.  Patient was given 2 DuoNeb treatment, 1 albuterol , 2 g magnesium , 125 mg Solu-Medrol  en route to the ED.  Subjective:  Has persistent headache. Dyspnea is worse today and similar to when she first came to the ED.   Assessment and Plan: Principal Problem:      Acute on chronic hypoxic respiratory failure Moderate persistent asthma with acute exacerbation COVID-19 viral infection: SpO2 down to 87% with ambulation while on home 3L O2 [place].  COVID-19 PCR positive.  - CXR > Fine reticular opacities throughout both lungs  - cont IV Solu-Medrol  40 mg twice daily - Pulmicort /Brovana  bid, DuoNeb as needed - Continue Singulair  - Continue supplemental O2 -  cont Tessalon , Robitussin DM and Tussionex   Chronic HFpEF: Appears compensated. Last EF 60-65% by TTE 08/17/2022.   -Continue home Lasix  40 mg daily.   CKD stage IIIb: Creatinine actually improved from baseline.  Continue to monitor.   Microcytic anemia Iron  panel checked in June did not reveal iron  deficiency - she has had folate deficiency in the past (5.3 in 8/24) - repeated today>  10.6   Hypertension: - BP may be elevated due to acute illness - Does not appear to be on antihypertensives at baseline.   -IV hydralazine  ordered as needed.    Hyperlipidemia: Continue atorvastatin , Zetia .   Gout: Continue febuxostat .   OSA/OHS: Continue BiPAP nightly.    Code Status: Full Code Total time on patient care: 35 min DVT prophylaxis:  lovenox    Objective:   Vitals:   03/03/24 0531 03/03/24 0754 03/03/24 0953 03/03/24 1311  BP: (!) 140/78  (!) 171/67   Pulse: 78  86   Resp: 18     Temp: 98.2 F (36.8 C)  98.2 F (36.8 C)   TempSrc:      SpO2: 98% 97% 97% 95%  Weight:      Height:       Filed Weights   03/01/24 1138 03/01/24 2100  Weight: 133.8 kg 128.6 kg   Exam: General exam: Appears comfortable  HEENT: oral mucosa moist Respiratory system: persistent rhonchi, wheezing and poor air movement bilaterally, cough Cardiovascular system: S1 & S2 heard  Gastrointestinal system: Abdomen soft, non-tender, nondistended. Normal bowel sounds   Extremities: No cyanosis, clubbing or edema Psychiatry:  Mood & affect appropriate.      CBC: Recent Labs  Lab 03/01/24 1434 03/02/24 0632  WBC 5.9 5.3  HGB 11.4* 10.0*  HCT 39.0 34.7*  MCV 76.2* 75.4*  PLT 151 153   Basic Metabolic Panel: Recent Labs  Lab 03/01/24 1434 03/02/24 0510  NA 143 141  K 3.8 4.7  CL 96* 99  CO2 32 34*  GLUCOSE 153* 113*  BUN 25* 26*  CREATININE 1.12* 1.28*  CALCIUM  9.3 8.8*  Scheduled Meds:  albuterol   2.5 mg Nebulization Q6H   arformoterol   15 mcg Nebulization BID   aspirin  EC  81 mg Oral Daily   atorvastatin   40 mg Oral Daily   benzonatate   100 mg Oral TID   budesonide  (PULMICORT ) nebulizer solution  0.25 mg Nebulization BID   calcitRIOL   0.25 mcg Oral Daily   chlorpheniramine-HYDROcodone   5 mL Oral Q12H   dextromethorphan -guaiFENesin   1 tablet Oral BID   enoxaparin  (LOVENOX ) injection  60 mg Subcutaneous Q24H   ezetimibe   10 mg Oral Daily   famotidine   20 mg Oral Q supper   febuxostat   40 mg Oral Daily   furosemide   40 mg Oral Daily   methylPREDNISolone  (SOLU-MEDROL ) injection  40 mg Intravenous Q12H   montelukast    10 mg Oral QHS   pantoprazole   40 mg Oral QAC breakfast    Imaging and lab data personally reviewed   Author: Kaelob Persky  03/03/2024 7:08 PM  To contact [facility]>   Check the care team in [facility] and look for the attending/consulting TRH provider listed  Log into [email or web address] and use [place]'s universal password   Go to> [facility]  and find provider  If you still have difficulty reaching the provider, please page the [facility] (Director on Call) for the Hospitalists listed on amion

## 2024-03-03 NOTE — Plan of Care (Signed)
  Problem: Education: Goal: Knowledge of risk factors and measures for prevention of condition will improve Outcome: Progressing   Problem: Respiratory: Goal: Will maintain a patent airway Outcome: Progressing   Problem: Education: Goal: Knowledge of General Education information will improve Description: Including pain rating scale, medication(s)/side effects and non-pharmacologic comfort measures Outcome: Progressing   Problem: Clinical Measurements: Goal: Ability to maintain clinical measurements within normal limits will improve Outcome: Progressing Goal: Will remain free from infection Outcome: Progressing Goal: Diagnostic test results will improve Outcome: Progressing Goal: Respiratory complications will improve Outcome: Progressing   Problem: Activity: Goal: Risk for activity intolerance will decrease Outcome: Progressing   Problem: Nutrition: Goal: Adequate nutrition will be maintained Outcome: Progressing

## 2024-03-03 NOTE — Plan of Care (Signed)
  Problem: Education: Goal: Knowledge of risk factors and measures for prevention of condition will improve Outcome: Progressing   Problem: Coping: Goal: Psychosocial and spiritual needs will be supported Outcome: Progressing   Problem: Respiratory: Goal: Will maintain a patent airway Outcome: Progressing   Problem: Respiratory: Goal: Complications related to the disease process, condition or treatment will be avoided or minimized Outcome: Progressing   Problem: Health Behavior/Discharge Planning: Goal: Ability to manage health-related needs will improve Outcome: Progressing   Problem: Clinical Measurements: Goal: Diagnostic test results will improve Outcome: Progressing   Problem: Clinical Measurements: Goal: Respiratory complications will improve Outcome: Progressing   Problem: Clinical Measurements: Goal: Cardiovascular complication will be avoided Outcome: Progressing   Problem: Nutrition: Goal: Adequate nutrition will be maintained Outcome: Progressing   Problem: Coping: Goal: Level of anxiety will decrease Outcome: Progressing

## 2024-03-03 NOTE — Progress Notes (Signed)
   03/03/24 2255  BiPAP/CPAP/SIPAP  BiPAP/CPAP/SIPAP Pt Type Adult  BiPAP/CPAP/SIPAP Resmed  Mask Type Full face mask  Dentures removed? Not applicable  Mask Size Medium  Pressure Support 6 cmH20  Flow Rate 3 lpm (bled into circuit)  Patient Home Machine No  Patient Home Mask No  Patient Home Tubing No  Auto Titrate Yes  Minimum cmH2O 6 cmH2O  Maximum cmH2O 18 cmH2O  CPAP/SIPAP surface wiped down Yes  Device Plugged into [employer] Out[employer] Yes

## 2024-03-04 DIAGNOSIS — J9621 Acute and chronic respiratory failure with hypoxia: Secondary | ICD-10-CM | POA: Diagnosis not present

## 2024-03-04 LAB — GLUCOSE, CAPILLARY: Glucose-Capillary: 148 mg/dL — ABNORMAL HIGH (ref 70–99)

## 2024-03-04 NOTE — Plan of Care (Signed)

## 2024-03-04 NOTE — Progress Notes (Signed)
 SATURATION QUALIFICATIONS: (This note is used to comply with regulatory documentation for home oxygen )  Patient Saturations on Room Air at Rest = 90%  Patient Saturations on Room Air while Ambulating = 87%  Patient Saturations on 3 Liters of oxygen  while Ambulating = 97%  Please briefly explain why patient needs home oxygen :

## 2024-03-04 NOTE — Progress Notes (Signed)
 [facility] Progress Note  Patient: Pamela [name]     FMW:[id number]  DOA: 03/01/2024   PCP: Paseda, [name], FNP       HPI: Pamela [name] is a 73 y.o. female with medical history significant for moderate persistent asthma, chronic respiratory failure with hypoxia on 3 L O2 [place], chronic HFpEF, RUL adenocarcinoma s/p SBRT, sarcoidosis, HTN, HLD, CKD stage IIIb, OSA/OHS on BiPAP who presented to the ED for evaluation of shortness of breath.   Patient reports 2 weeks of progressive shortness of breath and nonproductive cough.  She felt significantly more short of breath this morning when she awoke.  She has not had fevers, chills, diaphoresis, chest pain.   EMS were called.  Patient was given 2 DuoNeb treatment, 1 albuterol , 2 g magnesium , 125 mg Solu-Medrol  en route to the ED.  Subjective:  Persistent dyspnea. Cough is a little better. Dyspnea worse on exertion.   Assessment and Plan: Principal Problem:      Acute on chronic hypoxic respiratory failure Moderate persistent asthma with acute exacerbation COVID-19 viral infection: SpO2 down to 87% with ambulation while on home 3L O2 [place].  COVID-19 PCR positive.  - CXR > Fine reticular opacities throughout both lungs  - cont IV Solu-Medrol  40 mg twice daily - Pulmicort /Brovana  bid, DuoNeb as needed - Continue Singulair  -  cont Tessalon , Robitussin DM and Tussionex - back on 3 L O2 which she uses at baseline   Chronic HFpEF: Appears compensated. Last EF 60-65% by TTE 08/17/2022.   -Continue home Lasix  40 mg daily.   CKD stage IIIb: Creatinine actually improved from baseline.  Continue to monitor.   Microcytic anemia Iron  panel checked in June did not reveal iron  deficiency - she has had folate deficiency in the past (5.3 in 8/24) - rechecked on 8/15 >  10.6   Hypertension: - BP may be elevated due to acute illness - Does not appear to be on antihypertensives at baseline.   -IV hydralazine  ordered as needed.    Hyperlipidemia: Continue atorvastatin , Zetia .   Gout: Continue febuxostat .   OSA/OHS: Continue BiPAP nightly.    Code Status: Full Code Total time on patient care: 35 min DVT prophylaxis:  lovenox    Objective:   Vitals:   03/04/24 0857 03/04/24 1057 03/04/24 1320 03/04/24 1511  BP:   (!) 161/79   Pulse:  87 84   Resp:   18   Temp:   (!) 97.4 F (36.3 C)   TempSrc:      SpO2: 100% (!) 89% 97% 96%  Weight:      Height:       Filed Weights   03/01/24 1138 03/01/24 2100  Weight: 133.8 kg 128.6 kg   Exam: General exam: Appears comfortable  HEENT: oral mucosa moist Respiratory system: persistent rhonchi bilaterally, mild wheezing, persistent cough Cardiovascular system: S1 & S2 heard  Gastrointestinal system: Abdomen soft, non-tender, nondistended. Normal bowel sounds   Extremities: No cyanosis, clubbing or edema Psychiatry:  Mood & affect appropriate.      CBC: Recent Labs  Lab 03/01/24 1434 03/02/24 0632  WBC 5.9 5.3  HGB 11.4* 10.0*  HCT 39.0 34.7*  MCV 76.2* 75.4*  PLT 151 153   Basic Metabolic Panel: Recent Labs  Lab 03/01/24 1434 03/02/24 0510  NA 143 141  K 3.8 4.7  CL 96* 99  CO2 32 34*  GLUCOSE 153* 113*  BUN 25* 26*  CREATININE 1.12* 1.28*  CALCIUM  9.3 8.8*     Scheduled  Meds:  albuterol   2.5 mg Nebulization TID   arformoterol   15 mcg Nebulization BID   aspirin  EC  81 mg Oral Daily   atorvastatin   40 mg Oral Daily   benzonatate   100 mg Oral TID   budesonide  (PULMICORT ) nebulizer solution  0.25 mg Nebulization BID   calcitRIOL   0.25 mcg Oral Daily   chlorpheniramine-HYDROcodone   5 mL Oral Q12H   dextromethorphan -guaiFENesin   1 tablet Oral BID   enoxaparin  (LOVENOX ) injection  60 mg Subcutaneous Q24H   ezetimibe   10 mg Oral Daily   famotidine   20 mg Oral Q supper   febuxostat   40 mg Oral Daily   furosemide   40 mg Oral Daily   methylPREDNISolone  (SOLU-MEDROL ) injection  40 mg Intravenous Q12H   montelukast   10 mg Oral QHS    pantoprazole   40 mg Oral QAC breakfast    Imaging and lab data personally reviewed   Author: Homer Miller  03/04/2024 7:38 PM  To contact [facility]>   Check the care team in [facility] and look for the attending/consulting TR[facility] provider listed  Log into [email or web address] and use [place]'s universal password   Go to> [facility]  and find provider  If you still have difficulty reaching the provider, please page the [facility] (Director on Call) for the Hospitalists listed on amion

## 2024-03-05 DIAGNOSIS — J9621 Acute and chronic respiratory failure with hypoxia: Secondary | ICD-10-CM | POA: Diagnosis not present

## 2024-03-05 LAB — BASIC METABOLIC PANEL WITH GFR
Anion gap: 8 (ref 5–15)
BUN: 49 mg/dL — ABNORMAL HIGH (ref 8–23)
CO2: 33 mmol/L — ABNORMAL HIGH (ref 22–32)
Calcium: 8.5 mg/dL — ABNORMAL LOW (ref 8.9–10.3)
Chloride: 96 mmol/L — ABNORMAL LOW (ref 98–111)
Creatinine, Ser: 1.5 mg/dL — ABNORMAL HIGH (ref 0.44–1.00)
GFR, Estimated: 37 mL/min — ABNORMAL LOW (ref >60)
Glucose, Bld: 135 mg/dL — ABNORMAL HIGH (ref 70–99)
Potassium: 4.8 mmol/L (ref 3.5–5.1)
Sodium: 137 mmol/L (ref 135–145)

## 2024-03-05 MED ORDER — PREDNISONE 20 MG PO TABS: 40.0000 mg | ORAL_TABLET | Freq: Every day | ORAL | 0 refills | Status: DC

## 2024-03-05 MED ORDER — BENZONATATE 100 MG PO CAPS: 100.0000 mg | ORAL_CAPSULE | Freq: Three times a day (TID) | ORAL | 0 refills | Status: DC | PRN

## 2024-03-05 MED ORDER — ARFORMOTEROL TARTRATE 15 MCG/2ML IN NEBU: 15.0000 ug | INHALATION_SOLUTION | Freq: Two times a day (BID) | RESPIRATORY_TRACT | 0 refills | Status: DC

## 2024-03-05 MED ORDER — DM-GUAIFENESIN ER 30-600 MG PO TB12: 1.0000 | ORAL_TABLET | Freq: Two times a day (BID) | ORAL | 0 refills | Status: DC | PRN

## 2024-03-05 MED ORDER — ALBUTEROL SULFATE (2.5 MG/3ML) 0.083% IN NEBU: 2.5000 mg | INHALATION_SOLUTION | Freq: Every day | RESPIRATORY_TRACT | 0 refills | Status: AC | PRN

## 2024-03-05 MED ORDER — HYDROCOD POLI-CHLORPHE POLI ER 10-8 MG/5ML PO SUER: 5.0000 mL | Freq: Two times a day (BID) | ORAL | 0 refills | Status: DC

## 2024-03-05 NOTE — Progress Notes (Signed)
   03/04/24 2305  BiPAP/CPAP/SIPAP  BiPAP/CPAP/SIPAP Pt Type Adult  BiPAP/CPAP/SIPAP Resmed  Mask Type Full face mask  Dentures removed? Not applicable  Mask Size Medium  Pressure Support 6 cmH20  Flow Rate 3 lpm  Patient Home Machine No  Patient Home Mask No  Patient Home Tubing No  Auto Titrate Yes  Minimum cmH2O 6 cmH2O  Maximum cmH2O 18 cmH2O  Device Plugged into [employer] Out[employer] Yes

## 2024-03-05 NOTE — TOC Transition Note (Signed)
 Transition of Care [facility]) - Discharge Note   Patient Details  Name: Pamela [name] MRN: [id number] Date of Birth: 01-18-1951  Transition of Care [facility]) CM/SW Contact:  Sonda Manuella Quill, RN Phone Number: 03/05/2024, 1:38 PM   Clinical Narrative:    D/C orders received; spoke w/ pt in room; she says family is bringing full travel tank for transport home; no TOC needs.   Final next level of care: Home/Self Care Barriers to Discharge: No Barriers Identified   Patient Goals and CMS Choice Patient states their goals for this hospitalization and ongoing recovery are:: home CMS Medicare.gov Compare Post Acute Care list provided to:: Patient   [place] ownership interest in [facility].provided to:: Patient    Discharge Placement                       Discharge Plan and Services Additional resources added to the After Visit Summary for     Discharge Planning Services: CM Consult                                 Social Drivers of Health (SDOH) Interventions SDOH Screenings   Food Insecurity: No Food Insecurity (03/02/2024)  Housing: Low Risk  (03/02/2024)  Transportation Needs: No Transportation Needs (03/02/2024)  Utilities: Not At Risk (03/02/2024)  Alcohol Screen: Low Risk  (02/24/2024)  Depression (PHQ2-9): Low Risk  (02/24/2024)  Financial Resource Strain: Medium Risk (02/24/2024)  Physical Activity: Insufficiently Active (02/24/2024)  Social Connections: Moderately Integrated (03/01/2024)  Stress: No Stress Concern Present (02/24/2024)  Tobacco Use: Low Risk  (03/01/2024)  Health Literacy: Adequate Health Literacy (07/22/2023)     Readmission Risk Interventions    02/26/2023    6:27 PM 04/06/2022   12:12 PM  Readmission Risk Prevention Plan  Transportation Screening Complete Complete  PCP or Specialist Appt within 5-7 Days  Complete  Home Care Screening  Complete  Medication Review (RN CM)  Complete  Medication Review (RN Care Manager) Complete    PCP or Specialist appointment within 3-5 days of discharge Complete   HRI or Home Care Consult Complete   SW Recovery Care/Counseling Consult Complete   Palliative Care Screening Not Applicable   Skilled Nursing Facility Not Applicable

## 2024-03-05 NOTE — Discharge Summary (Signed)
 Physician Discharge Summary  Pamela [name] FMW:[id number] DOB: [birth year] DOA: 03/01/2024  PCP: Paseda, [name], FNP  Admit date: 03/01/2024 Discharge date: 03/05/2024 Discharging to: home     Discharge Diagnoses:   Principal Problem:   Acute on chronic respiratory failure with hypoxia [facility]) Active Problems:   Moderate persistent asthma with acute exacerbation   COVID-19 virus infection   Anemia of chronic disease   Chronic heart failure with preserved ejection fraction (HFpEF) (HCC)   Obstructive sleep apnea   Stage 3b chronic kidney disease (HCC)   Pneumonia due to COVID-19 virus  HPI: Pamela [name] is a 73 y.o. female with medical history significant for moderate persistent asthma, chronic respiratory failure with hypoxia on 3 L O2 [place], chronic HFpEF, RUL adenocarcinoma s/p SBRT, sarcoidosis, HTN, HLD, CKD stage IIIb, OSA/OHS on BiPAP who presented to the ED for evaluation of shortness of breath.   Patient reports 2 weeks of progressive shortness of breath and nonproductive cough.  She felt significantly more short of breath this morning when she awoke.  She has not had fevers, chills, diaphoresis, chest pain.   EMS were called.  Patient was given 2 DuoNeb treatment, 1 albuterol , 2 g magnesium , 125 mg Solu-Medrol  en route to the ED.    Assessment and Plan: Principal Problem:      Acute on chronic hypoxic respiratory failure Moderate persistent asthma with acute exacerbation COVID-19 viral infection: SpO2 down to 87% with ambulation while on home 3L O2 [place].  COVID-19 PCR positive.  - CXR > Fine reticular opacities throughout both lungs  - Pulmicort /Brovana  bid, DuoNeb as needed - Continue Singulair  -  cont Tessalon , Robitussin DM and Tussionex - weaned down to3 L O2 which she uses at baseline    Chronic HFpEF: Appears compensated. Last EF 60-65% by TTE 08/17/2022.   -Continue home Lasix  40 mg daily.   CKD stage IIIb:   Microcytic anemia Iron  panel checked in June  did not reveal iron  deficiency - she has had folate deficiency in the past (5.3 in 8/24) - rechecked on 8/15 >  10.6   Hypertension: - BP may be elevated due to acute illness - Does not appear to be on antihypertensives at baseline.     Hyperlipidemia: Continue atorvastatin , Zetia .   Gout: Continue febuxostat .   OSA/OHS: Continue BiPAP nightly.          Discharge Instructions  Discharge Instructions     Discharge patient   Complete by: As directed    Discharge disposition: 01-Home or Self Care   Discharge patient date: 03/05/2024      Allergies as of 03/05/2024       Reactions   Sulfa  Antibiotics Hives, Rash        Medication List     TAKE these medications    acetaminophen  500 MG tablet Commonly known as: TYLENOL  Take 1,000 mg by mouth daily as needed for mild pain (pain score 1-3).   albuterol  108 (90 Base) MCG/ACT inhaler Commonly known as: ProAir  HFA Inhale 2 puffs into the lungs every 4 (four) hours as needed for wheezing or shortness of breath. What changed: when to take this   albuterol  (2.5 MG/3ML) 0.083% nebulizer solution Commonly known as: PROVENTIL  Take 3 mLs (2.5 mg total) by nebulization daily as needed for wheezing or shortness of breath. What changed:  how much to take how to take this when to take this reasons to take this additional instructions   arformoterol  15 MCG/2ML Nebu Commonly known as: BROVANA  Take  2 mLs (15 mcg total) by nebulization 2 (two) times daily.   aspirin  EC 81 MG tablet Take 1 tablet (81 mg total) by mouth daily. Swallow whole.   atorvastatin  40 MG tablet Commonly known as: LIPITOR TAKE 1 TABLET EVERY DAY   B-complex with vitamin C tablet Take 1 tablet by mouth daily.   benzonatate  100 MG capsule Commonly known as: TESSALON  Take 1 capsule (100 mg total) by mouth 3 (three) times daily as needed for cough.   budesonide  0.25 MG/2ML nebulizer solution Commonly known as: PULMICORT  INHALE THE CONTENTS OF  1 VIAL VIA NEBULIZER TWICE DAILY AS DIRECTED WITH ALBUTEROL  What changed:  how much to take how to take this when to take this reasons to take this additional instructions   calcitRIOL  0.25 MCG capsule Commonly known as: ROCALTROL  TAKE 1 CAPSULE EVERY DAY   cetirizine  10 MG chewable tablet Commonly known as: ZYRTEC  Chew 10 mg by mouth at bedtime.   chlorpheniramine-HYDROcodone  10-8 MG/5ML Commonly known as: TUSSIONEX Take 5 mLs by mouth every 12 (twelve) hours.   Depend Underwear Large/XL Misc 1 each by Does not apply route 2 times daily at 12 noon and 4 pm.   dextromethorphan -guaiFENesin  30-600 MG 12hr tablet Commonly known as: MUCINEX  DM Take 1 tablet by mouth 2 (two) times daily as needed for cough.   diclofenac  Sodium 1 % Gel Commonly known as: VOLTAREN  Apply 2 g topically 4 (four) times daily. What changed:  how much to take when to take this reasons to take this   ezetimibe  10 MG tablet Commonly known as: ZETIA  Take 1 tablet (10 mg total) by mouth daily.   famotidine  20 MG tablet Commonly known as: PEPCID  TAKE 1 TABLET AFTER SUPPER What changed:  how much to take how to take this when to take this additional instructions   febuxostat  40 MG tablet Commonly known as: ULORIC  TAKE 1 TABLET EVERY DAY   folic acid  1 MG tablet Commonly known as: FOLVITE  Take 1 tablet (1 mg total) by mouth daily.   furosemide  40 MG tablet Commonly known as: LASIX  TAKE 1 TABLET EVERY DAY   hydrOXYzine  25 MG tablet Commonly known as: ATARAX  Take 1 tablet (25 mg total) by mouth 3 (three) times daily as needed for itching.   montelukast  10 MG tablet Commonly known as: SINGULAIR  TAKE 1 TABLET AT BEDTIME   pantoprazole  40 MG tablet Commonly known as: PROTONIX  TAKE 1 TABLET EVERY DAY 30 TO 60 MINUTES BEFORE FIRST MEAL OF THE DAY What changed: See the new instructions.   predniSONE  20 MG tablet Commonly known as: DELTASONE  Take 2 tablets (40 mg total) by mouth daily.    Stool Softener 100 MG capsule Generic drug: docusate sodium  Take 100 mg by mouth daily.   Vitamin D3 25 MCG (1000 UT) Caps Take 1 capsule (1,000 Units total) by mouth daily.            The results of significant diagnostics from this hospitalization (including imaging, microbiology, ancillary and laboratory) are listed below for reference.    DG Chest 2 View Result Date: 03/01/2024 CLINICAL DATA:  SOB , cough EXAM: CHEST - 2 VIEW COMPARISON:  September 21, 2023 FINDINGS: Fine reticular opacities throughout both lungs. No focal airspace consolidation or pleural effusion. No pneumothorax. No cardiomegaly. Cardiac loop recorder device. Tortuous aorta with aortic atherosclerosis. No acute fracture or destructive lesions. Multilevel thoracic osteophytosis. IMPRESSION: Fine reticular opacities throughout both lungs, worrisome for a atypical infection or viral pneumonia. Alternatively, interstitial edema  could also have this appearance in the correct clinical context. Electronically Signed   By: Rogelia Myers M.D.   On: 03/01/2024 13:08   Labs:   Basic Metabolic Panel: Recent Labs  Lab 03/01/24 1434 03/02/24 0510 03/05/24 0542  NA 143 141 137  K 3.8 4.7 4.8  CL 96* 99 96*  CO2 32 34* 33*  GLUCOSE 153* 113* 135*  BUN 25* 26* 49*  CREATININE 1.12* 1.28* 1.50*  CALCIUM  9.3 8.8* 8.5*     CBC: Recent Labs  Lab 03/01/24 1434 03/02/24 0632  WBC 5.9 5.3  HGB 11.4* 10.0*  HCT 39.0 34.7*  MCV 76.2* 75.4*  PLT 151 153         SIGNED:   True Atlas, MD  [facility] 03/05/2024, 3:50 PM Time taking on discharge: 50 minutes

## 2024-03-05 NOTE — Plan of Care (Signed)

## 2024-03-05 NOTE — Plan of Care (Signed)
  Problem: Education: Goal: Knowledge of risk factors and measures for prevention of condition will improve Outcome: Progressing   Problem: Coping: Goal: Psychosocial and spiritual needs will be supported Outcome: Progressing   Problem: Respiratory: Goal: Complications related to the disease process, condition or treatment will be avoided or minimized Outcome: Progressing   Problem: Education: Goal: Knowledge of General Education information will improve Description: Including pain rating scale, medication(s)/side effects and non-pharmacologic comfort measures Outcome: Progressing   Problem: Health Behavior/Discharge Planning: Goal: Ability to manage health-related needs will improve Outcome: Progressing   Problem: Clinical Measurements: Goal: Ability to maintain clinical measurements within normal limits will improve Outcome: Progressing Goal: Will remain free from infection Outcome: Progressing Goal: Diagnostic test results will improve Outcome: Progressing Goal: Respiratory complications will improve Outcome: Progressing Goal: Cardiovascular complication will be avoided Outcome: Progressing   Problem: Activity: Goal: Risk for activity intolerance will decrease Outcome: Progressing   Problem: Nutrition: Goal: Adequate nutrition will be maintained Outcome: Progressing   Problem: Coping: Goal: Level of anxiety will decrease Outcome: Progressing   Problem: Elimination: Goal: Will not experience complications related to bowel motility Outcome: Progressing Goal: Will not experience complications related to urinary retention Outcome: Progressing   Problem: Pain Managment: Goal: General experience of comfort will improve and/or be controlled Outcome: Progressing   Problem: Safety: Goal: Ability to remain free from injury will improve Outcome: Progressing   Problem: Skin Integrity: Goal: Risk for impaired skin integrity will decrease Outcome: Progressing    Problem: Respiratory: Goal: Will maintain a patent airway Outcome: Not Progressing

## 2024-03-06 ENCOUNTER — Telehealth: Payer: Self-pay

## 2024-03-06 NOTE — Transitions of Care (Post Inpatient/ED Visit) (Signed)
   03/06/2024  Name: Pamela [name] MRN: [id number] DOB: February 15, 1951  Today's TOC FU Call Status: Today's TOC FU Call Status:: Unsuccessful Call (1st Attempt) Unsuccessful Call (1st Attempt) Date: 03/06/24  Attempted to reach the patient regarding the most recent Inpatient/ED visit.  Follow Up Plan: Additional outreach attempts will be made to reach the patient to complete the Transitions of Care (Post Inpatient/ED visit) call.   Arvin Seip RN, BSN, CCM [employer], Population Health Case Manager Phone: [phone]

## 2024-03-07 ENCOUNTER — Telehealth: Payer: Self-pay

## 2024-03-07 NOTE — Transitions of Care (Post Inpatient/ED Visit) (Signed)
   03/07/2024  Name: Pamela [name] MRN: [id number] DOB: [birth year]  Today's TOC FU Call Status: Today's TOC FU Call Status:: Unsuccessful Call (2nd Attempt) Unsuccessful Call (2nd Attempt) Date: 03/07/24  Attempted to reach the patient regarding the most recent Inpatient/ED visit.  Follow Up Plan: Additional outreach attempts will be made to reach the patient to complete the Transitions of Care (Post Inpatient/ED visit) call.  Shona Prow RN, CCM [place]  VBCI-Population Health RN Care Manager [phone]

## 2024-03-08 ENCOUNTER — Telehealth: Payer: Self-pay

## 2024-03-08 DIAGNOSIS — J9601 Acute respiratory failure with hypoxia: Secondary | ICD-10-CM | POA: Diagnosis not present

## 2024-03-08 DIAGNOSIS — J441 Chronic obstructive pulmonary disease with (acute) exacerbation: Secondary | ICD-10-CM | POA: Diagnosis not present

## 2024-03-08 DIAGNOSIS — I5032 Chronic diastolic (congestive) heart failure: Secondary | ICD-10-CM | POA: Diagnosis not present

## 2024-03-08 NOTE — Transitions of Care (Post Inpatient/ED Visit) (Signed)
   03/08/2024  Name: Pamela [name] MRN: [id number] DOB: 01-03-1951  Today's TOC FU Call Status: Today's TOC FU Call Status:: Unsuccessful Call (3rd Attempt) Unsuccessful Call (3rd Attempt) Date: 03/08/24  Attempted to reach the patient regarding the most recent Inpatient/ED visit.  Follow Up Plan: No further outreach attempts will be made at this time. We have been unable to contact the patient.  Shona Prow RN, CCM [place]  VBCI-Population Health RN Care Manager [phone]

## 2024-03-16 ENCOUNTER — Ambulatory Visit: Admitting: Podiatry

## 2024-03-16 DIAGNOSIS — N189 Chronic kidney disease, unspecified: Secondary | ICD-10-CM | POA: Diagnosis not present

## 2024-03-16 DIAGNOSIS — N1831 Chronic kidney disease, stage 3a: Secondary | ICD-10-CM | POA: Diagnosis not present

## 2024-03-16 DIAGNOSIS — M109 Gout, unspecified: Secondary | ICD-10-CM | POA: Diagnosis not present

## 2024-03-16 DIAGNOSIS — N2581 Secondary hyperparathyroidism of renal origin: Secondary | ICD-10-CM | POA: Diagnosis not present

## 2024-03-17 ENCOUNTER — Other Ambulatory Visit: Payer: Self-pay

## 2024-03-17 DIAGNOSIS — M109 Gout, unspecified: Secondary | ICD-10-CM

## 2024-03-17 DIAGNOSIS — I5030 Unspecified diastolic (congestive) heart failure: Secondary | ICD-10-CM

## 2024-03-17 DIAGNOSIS — J4530 Mild persistent asthma, uncomplicated: Secondary | ICD-10-CM

## 2024-03-17 MED ORDER — ALBUTEROL SULFATE HFA 108 (90 BASE) MCG/ACT IN AERS: 2.0000 | INHALATION_SPRAY | RESPIRATORY_TRACT | 5 refills | Status: AC | PRN

## 2024-03-17 MED ORDER — FEBUXOSTAT 40 MG PO TABS: 40.0000 mg | ORAL_TABLET | Freq: Every day | ORAL | 3 refills | Status: DC

## 2024-03-17 MED ORDER — ATORVASTATIN CALCIUM 40 MG PO TABS: 40.0000 mg | ORAL_TABLET | Freq: Every day | ORAL | 0 refills | Status: DC

## 2024-03-17 MED ORDER — FUROSEMIDE 40 MG PO TABS: 40.0000 mg | ORAL_TABLET | Freq: Every day | ORAL | 3 refills | Status: AC

## 2024-03-17 MED ORDER — MONTELUKAST SODIUM 10 MG PO TABS: 10.0000 mg | ORAL_TABLET | Freq: Every day | ORAL | 3 refills | Status: AC

## 2024-03-17 MED ORDER — EZETIMIBE 10 MG PO TABS: 10.0000 mg | ORAL_TABLET | Freq: Every day | ORAL | 3 refills | Status: AC

## 2024-03-17 MED ORDER — FAMOTIDINE 20 MG PO TABS: ORAL_TABLET | ORAL | 3 refills | Status: AC

## 2024-03-22 ENCOUNTER — Other Ambulatory Visit: Payer: Self-pay | Admitting: Nurse Practitioner

## 2024-03-22 ENCOUNTER — Telehealth: Payer: Self-pay

## 2024-03-22 DIAGNOSIS — M109 Gout, unspecified: Secondary | ICD-10-CM

## 2024-03-22 NOTE — Telephone Encounter (Signed)
 Please advise [facility]

## 2024-03-22 NOTE — Telephone Encounter (Signed)
 Copied from CRM [id number]. Topic: Clinical - Prescription Issue >> Mar 22, 2024 10:28 AM Treva T wrote: Reason for CRM: Received call from patient, states received call from pharmacy on yesterday to inform that medication, febuxostat  (ULORIC ) 40 MG tablet, is no longer carried at pharmacy.  Per patient states, per pharmacy, needs an updated new prescription for generic of medication for gout.  Patient can be reached for follow up call once new prescription is submitted. Can be reached at [phone].   Pharmacy:   [facility] [place], [place] - [address] [employer] [place] [address] Phone: [phone] Fax: [phone]

## 2024-03-22 NOTE — Telephone Encounter (Signed)
 Sent to PCP. KH

## 2024-03-23 ENCOUNTER — Other Ambulatory Visit: Payer: Self-pay

## 2024-03-23 NOTE — Telephone Encounter (Signed)
 Pt only needs a generic for uloric  which is febuxostat . Please advise dif it can be sent over to [facility]. Kh

## 2024-03-24 ENCOUNTER — Other Ambulatory Visit: Payer: Self-pay | Admitting: Nurse Practitioner

## 2024-03-24 DIAGNOSIS — M109 Gout, unspecified: Secondary | ICD-10-CM

## 2024-03-24 MED ORDER — FEBUXOSTAT 40 MG PO TABS
40.0000 mg | ORAL_TABLET | Freq: Every day | ORAL | 3 refills | Status: DC
Start: 2024-03-24 — End: 2024-03-31

## 2024-03-30 ENCOUNTER — Other Ambulatory Visit: Payer: Self-pay

## 2024-03-30 NOTE — Telephone Encounter (Signed)
 Please advise [facility]

## 2024-03-30 NOTE — Telephone Encounter (Signed)
 Pt called to say that the pharmacy that is filling this rx needs more information. Please advise them. [facility] is the pharmacy.

## 2024-03-31 ENCOUNTER — Other Ambulatory Visit: Payer: Self-pay | Admitting: Nurse Practitioner

## 2024-03-31 DIAGNOSIS — M109 Gout, unspecified: Secondary | ICD-10-CM

## 2024-03-31 MED ORDER — ALLOPURINOL 100 MG PO TABS: 50.0000 mg | ORAL_TABLET | Freq: Every day | ORAL | 2 refills | Status: AC

## 2024-03-31 MED ORDER — HYDROXYZINE HCL 25 MG PO TABS: 25.0000 mg | ORAL_TABLET | Freq: Three times a day (TID) | ORAL | 0 refills | Status: AC | PRN

## 2024-03-31 MED ORDER — PANTOPRAZOLE SODIUM 40 MG PO TBEC: DELAYED_RELEASE_TABLET | ORAL | 3 refills | Status: AC

## 2024-03-31 MED ORDER — CALCITRIOL 0.25 MCG PO CAPS: 0.2500 ug | ORAL_CAPSULE | Freq: Every day | ORAL | 3 refills | Status: AC

## 2024-04-02 DIAGNOSIS — J9612 Chronic respiratory failure with hypercapnia: Secondary | ICD-10-CM | POA: Diagnosis not present

## 2024-04-03 ENCOUNTER — Telehealth: Payer: Self-pay

## 2024-04-03 NOTE — Telephone Encounter (Signed)
-----   Message from [name] Paseda sent at 03/31/2024  5:49 PM EDT ----- Luke please notify the patient, she should start taking allopurinol  50 mg daily since her insurance prefers allopurinol  to Uloric . Thanks    1. Gouty arthritis  - allopurinol  (ZYLOPRIM ) 100 MG tablet; Take 0.5 tablets (50 mg total) by mouth daily.  Dispense: 60 tablet; Refill: 2 ----- Message ----- From: Chrys Burnard MATSU, CPhT Sent: 03/31/2024   9:36 AM EDT To: [name] Paseda, FNP  Patient's pharmacy is requesting Uloric  be changed to insurances preferred alternative of Allopurinol .

## 2024-04-03 NOTE — Telephone Encounter (Signed)
 Called to notify the patient, she should start taking allopurinol  50 mg daily since her insurance prefers allopurinol  to Uloric . Thanks    1. Gouty arthritis   - allopurinol  (ZYLOPRIM ) 100 MG tablet; Take 0.5 tablets (50 mg total) by mouth daily.  Dispense: 60 tablet; Refill: 2   No answer lvm. KH

## 2024-04-04 ENCOUNTER — Ambulatory Visit: Admitting: Podiatry

## 2024-04-07 DIAGNOSIS — J449 Chronic obstructive pulmonary disease, unspecified: Secondary | ICD-10-CM | POA: Diagnosis not present

## 2024-04-08 DIAGNOSIS — I5032 Chronic diastolic (congestive) heart failure: Secondary | ICD-10-CM | POA: Diagnosis not present

## 2024-04-08 DIAGNOSIS — J9601 Acute respiratory failure with hypoxia: Secondary | ICD-10-CM | POA: Diagnosis not present

## 2024-04-11 ENCOUNTER — Encounter: Payer: Self-pay | Admitting: Adult Health

## 2024-04-11 ENCOUNTER — Ambulatory Visit: Admitting: Adult Health

## 2024-04-11 ENCOUNTER — Ambulatory Visit (INDEPENDENT_AMBULATORY_CARE_PROVIDER_SITE_OTHER)

## 2024-04-11 VITALS — BP 112/60 | HR 69 | Temp 98.1°F | Ht 64.0 in | Wt 310.0 lb

## 2024-04-11 DIAGNOSIS — J441 Chronic obstructive pulmonary disease with (acute) exacerbation: Secondary | ICD-10-CM

## 2024-04-11 DIAGNOSIS — J9612 Chronic respiratory failure with hypercapnia: Secondary | ICD-10-CM | POA: Diagnosis not present

## 2024-04-11 DIAGNOSIS — Z85118 Personal history of other malignant neoplasm of bronchus and lung: Secondary | ICD-10-CM

## 2024-04-11 DIAGNOSIS — I5030 Unspecified diastolic (congestive) heart failure: Secondary | ICD-10-CM | POA: Diagnosis not present

## 2024-04-11 DIAGNOSIS — J9611 Chronic respiratory failure with hypoxia: Secondary | ICD-10-CM | POA: Diagnosis not present

## 2024-04-11 DIAGNOSIS — J45909 Unspecified asthma, uncomplicated: Secondary | ICD-10-CM | POA: Diagnosis not present

## 2024-04-11 DIAGNOSIS — D869 Sarcoidosis, unspecified: Secondary | ICD-10-CM

## 2024-04-11 DIAGNOSIS — G4733 Obstructive sleep apnea (adult) (pediatric): Secondary | ICD-10-CM

## 2024-04-11 DIAGNOSIS — C3490 Malignant neoplasm of unspecified part of unspecified bronchus or lung: Secondary | ICD-10-CM

## 2024-04-11 DIAGNOSIS — Z23 Encounter for immunization: Secondary | ICD-10-CM | POA: Diagnosis not present

## 2024-04-11 DIAGNOSIS — I5032 Chronic diastolic (congestive) heart failure: Secondary | ICD-10-CM

## 2024-04-11 MED ORDER — ARFORMOTEROL TARTRATE 15 MCG/2ML IN NEBU: 15.0000 ug | INHALATION_SOLUTION | Freq: Two times a day (BID) | RESPIRATORY_TRACT | 5 refills | Status: AC

## 2024-04-11 NOTE — Addendum Note (Signed)
 Addended by: MOODY RANCHER on: 04/11/2024 11:41 AM   Modules accepted: Orders

## 2024-04-11 NOTE — Patient Instructions (Addendum)
 Chest xray today .  Flu shot.  Continue on Budesonide  and Albuterol  neb Twice daily   Begin Brovana  neb Twice daily   Albuterol  inhaler or neb every 6hr as needed.  Singulair  10mg  At bedtime  Zyrtec  10mg  daily  Albuterol  inhaler or neb As needed   Continue on Oxygen  3-4 l/m to keep O2 sats >90%.  Continue on Lasix  40mg  daily  Continue on BIPAP ST machine At bedtime  and with naps.  Follow up with Dr. Darlean  or Kamilla Hands NP in 2-3 months and As needed   Please contact office for sooner follow up if symptoms do not improve or worsen or seek emergency care

## 2024-04-11 NOTE — Progress Notes (Signed)
 @Patient  ID: Pamela [name], female    DOB: 1950-12-09, 73 y.o.   MRN: [id number]  Chief Complaint  Patient presents with   Asthma    Referring provider: Paseda, [name], FNP  HPI: 73 year old female never smoker followed for asthma with allergic phenotype, sarcoidosis, history of lung cancer (diagnosed in 2012 status post SBRT) Chronic respiratory failure on oxygen  from 2013 through 2018 and then restarted in 2023 Medical history significant for right anterior chest wall mass/right pectoralis minor muscle mass) biopsy done 2015 which was nondiagnostic, PET scan October 2016 showed no changes in metabolic activity or size suggestive of a benign etiology.  She has a history of chronic kidney disease, congestive heart failure, PFO on echo bubble study, prior CVA  TEST/EVENTS :  CT chest Nov 20, 2021 similar posttreatment fibrosis/consolidation with volume loss in the right upper lobe.  No evidence of recurrence, tiny right upper lobe pulmonary nodule no suspicious pulmonary nodules.  A 5.5 x 2.1 cm soft tissue mass in the right pectoralis major and minor musculature.    pfts 07/31/13  FEV1  1.53 (58%) with ratio 66 > p saba ratio 74 FEV1 1.68 (64%)    FENO 06/01/2016  =   85    Spirometry 02/22/2018  FEV1 1.23 (57%)  Ratio 71 with min curvature  - FENO 02/22/2018  = 61   05/2022 PFTs today show severe restriction with FEV1 at 51%, ratio 85, FVC 46%, no significant bronchodilator response, DLCO 69%.    Split night sleep study in 07/2014 showed mild OSA only with AHI 6.9. O2 sats nml on 2l/m .    She also carries dx of Sarcoid from previous bx. (followed previously by Dr. Seleta at [facility], with lymphadenopathy in chest , abd w/ possible spleen and liver involvement.    03/2022 CT chest neg for PE, stable RUL scarring likely radiation changes.    CT chest Nov 30, 2022 stable platelike scarring/radiation changes in the right upper lobe, no evidence of recurrent or metastatic  disease   11/2022 Absolute Eosinophil 1,500  04/11/2024 Follow up : Asthma, O2 RF, Lung cancer hx, OSA/OHS, Post hospital follow up   Discussed the use of AI scribe software for clinical note transcription with the patient, who gave verbal consent to proceed.  History of Present Illness Pamela [name] is a 73 year old female who presents for follow-up after hospitalization for COVID pneumonia.  She was hospitalized last month due to COVID pneumonia, during which she experienced significant shortness of breath and required increased levels of supplemental oxygen . She was treated with nebulized bronchodilators.   She traveled to [place]  before her illness, where she believes she contracted COVID, although no one else in her group became ill. She has received the COVID vaccine in the past . We discussed getting booster in 90 days after illness ~ November.  Her current medications include budesonide  and albuterol  via nebulizer, Singulair , Zyrtec ,  She uses a BiPAP ST machine regularly and feels she benefits from it.  Remains on O2 3l/m . Today in office at rest on room air, O2 sats 97%.  Download shows excellent compliance with daily usage at 8hr, AHI 1.6. on BIPAP ST /IVAPS EPAP min 5, PS 6-20, RR 12  She manages her daily activities independently without assistance at home. Has chronic leg swelling.  She reports no current breathing difficulties   Patient was admitted last month to the hospital for an acute asthmatic bronchitic exacerbation in the setting of  COVID-19 infection along with acute on chronic hypoxic respiratory failure and pneumonia.  Patient was treated with nebulized bronchodilators.  Required increased oxygen  demands on admission and weaned down to 3 L which was her baseline prior to admission.  Chest x-ray showed fine reticular opacities bilaterally on admission.    Allergies  Allergen Reactions   Sulfa  Antibiotics Hives and Rash    Immunization History  Administered  Date(s) Administered   Fluad Quad(high Dose 65+) 07/28/2023   Influenza,inj,Quad PF,6+ Mos 04/13/2014, 04/16/2015, 04/03/2016, 05/19/2017, 06/15/2018, 05/14/2019, 04/21/2022   PFIZER(Purple Top)SARS-COV-2 Vaccination 10/14/2019, 11/08/2019, 07/08/2020, 03/06/2021   PNEUMOCOCCAL CONJUGATE-20 03/06/2021   Pneumococcal Conjugate-13 04/03/2016   Pneumococcal Polysaccharide-23 04/13/2014, 12/26/2019   Tdap 01/02/2015   Zoster, Live 05/21/2014    Past Medical History:  Diagnosis Date   Arthritis    Asthma    Cancer (HCC)    lung, adenocarcinoma right lung 2012   CHF (congestive heart failure) (HCC)    Preserved EF   Congenital single kidney    With chronic kidney disease   COPD (chronic obstructive pulmonary disease) (HCC)    Gout    Hypertension    Lung cancer (HCC) 2012   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 Patient notes it was consistent with sarcoidosis but actual pathology results not available.   Oxygen  deficiency    Sarcoidosis    Sickle cell trait    Sleep apnea     Tobacco History: Social History   Tobacco Use  Smoking Status Never  Smokeless Tobacco Never   Counseling given: Not Answered   Outpatient Medications Prior to Visit  Medication Sig Dispense Refill   acetaminophen  (TYLENOL ) 500 MG tablet Take 1,000 mg by mouth daily as needed for mild pain (pain score 1-3).     albuterol  (PROAIR  HFA) 108 (90 Base) MCG/ACT inhaler Inhale 2 puffs into the lungs every 4 (four) hours as needed for wheezing or shortness of breath. 8 g 5   albuterol  (PROVENTIL ) (2.5 MG/3ML) 0.083% nebulizer solution Take 3 mLs (2.5 mg total) by nebulization daily as needed for wheezing or shortness of breath. 270 mL 0   allopurinol  (ZYLOPRIM ) 100 MG tablet Take 0.5 tablets (50 mg total) by mouth daily. 60 tablet 2    aspirin  EC 81 MG tablet Take 1 tablet (81 mg total) by mouth daily. Swallow whole. 30 tablet 11   atorvastatin  (LIPITOR) 40 MG tablet Take 1 tablet (40 mg total) by mouth daily. 90 tablet 0   B Complex-C (B-COMPLEX WITH VITAMIN C) tablet Take 1 tablet by mouth daily. 120 tablet 0   benzonatate  (TESSALON ) 100 MG capsule Take 1 capsule (100 mg total) by mouth 3 (three) times daily as needed for cough. 60 capsule 0   budesonide  (PULMICORT ) 0.25 MG/2ML nebulizer solution INHALE THE CONTENTS OF 1 VIAL VIA NEBULIZER TWICE DAILY AS DIRECTED WITH ALBUTEROL  360 mL 3   calcitRIOL  (ROCALTROL ) 0.25 MCG capsule Take 1 capsule (0.25 mcg total) by mouth daily. 90 capsule 3   cetirizine  (ZYRTEC ) 10 MG chewable tablet Chew 10 mg by mouth at bedtime.     chlorpheniramine-HYDROcodone  (TUSSIONEX) 10-8 MG/5ML Take 5 mLs by mouth every 12 (twelve) hours. 473 mL 0   Cholecalciferol (VITAMIN D3) 25 MCG (1000 UT) CAPS Take 1 capsule (1,000 Units total) by mouth daily. 60 capsule  2   dextromethorphan -guaiFENesin  (MUCINEX  DM) 30-600 MG 12hr tablet Take 1 tablet by mouth 2 (two) times daily as needed for cough. 60 tablet 0   diclofenac  Sodium (VOLTAREN ) 1 % GEL Apply 2 g topically 4 (four) times daily. 100 g 0   docusate sodium  (STOOL SOFTENER) 100 MG capsule Take 100 mg by mouth daily.     ezetimibe  (ZETIA ) 10 MG tablet Take 1 tablet (10 mg total) by mouth daily. 90 tablet 3   famotidine  (PEPCID ) 20 MG tablet TAKE 1 TABLET AFTER SUPPER 90 tablet 3   folic acid  (FOLVITE ) 1 MG tablet Take 1 tablet (1 mg total) by mouth daily. 120 tablet 0   furosemide  (LASIX ) 40 MG tablet Take 1 tablet (40 mg total) by mouth daily. 90 tablet 3   hydrOXYzine  (ATARAX ) 25 MG tablet Take 1 tablet (25 mg total) by mouth 3 (three) times daily as needed for itching. 30 tablet 0   Incontinence Supply Disposable (DEPEND UNDERWEAR LARGE/XL) MISC 1 each by Does not apply route 2 times daily at 12 noon and 4 pm. 17 each 12   montelukast  (SINGULAIR ) 10  MG tablet Take 1 tablet (10 mg total) by mouth at bedtime. 90 tablet 3   pantoprazole  (PROTONIX ) 40 MG tablet TAKE 1 TABLET EVERY DAY 30 TO 60 MINUTES BEFORE FIRST MEAL OF THE DAY 90 tablet 3   arformoterol  (BROVANA ) 15 MCG/2ML NEBU Take 2 mLs (15 mcg total) by nebulization 2 (two) times daily. 120 mL 0   predniSONE  (DELTASONE ) 20 MG tablet Take 2 tablets (40 mg total) by mouth daily. (Patient not taking: Reported on 04/11/2024) 8 tablet 0   No facility-administered medications prior to visit.     Review of Systems:   Constitutional:   No  weight loss, night sweats,  Fevers, chills,+ fatigue, or  lassitude.  HEENT:   No headaches,  Difficulty swallowing,  Tooth/dental problems, or  Sore throat,                No sneezing, itching, ear ache, nasal congestion, post nasal drip,   CV:  No chest pain,  Orthopnea, PND, +swelling in lower extremities, anasarca, dizziness, palpitations, syncope.   GI  No heartburn, indigestion, abdominal pain, nausea, vomiting, diarrhea, change in bowel habits, loss of appetite, bloody stools.   Resp:  No chest wall deformity  Skin: no rash or lesions.  GU: no dysuria, change in color of urine, no urgency or frequency.  No flank pain, no hematuria   MS:  No joint pain or swelling.  No decreased range of motion.  No back pain.    Physical Exam  BP 112/60 (BP Location: Left Arm, Patient Position: Sitting, Cuff Size: Large)   Pulse 69   Temp 98.1 F (36.7 C) (Oral)   Ht 5' 4 (1.626 m)   Wt (!) 310 lb (140.6 kg)   SpO2 97%   BMI 53.21 kg/m   GEN: A/Ox3; pleasant , NAD, well nourished , walker , O2    HEENT:  [place]/AT,  EACs-clear, TMs-wnl, NOSE-clear, THROAT-clear, no lesions, no postnasal drip or exudate noted.   NECK:  Supple w/ fair ROM; no JVD; normal carotid impulses w/o bruits; no thyromegaly or nodules palpated; no lymphadenopathy.    RESP few trace rhonchi   no accessory muscle use, no dullness to percussion  CARD:  RRR, no m/[name]/g,2-3+  peripheral edema, pulses intact, no cyanosis or clubbing.  GI:   Soft & nt; nml bowel sounds; no organomegaly or  masses detected.   Musco: Warm bil, no deformities or joint swelling noted.   Neuro: alert, no focal deficits noted.    Skin: Warm, no lesions or rashes    Lab Results:  CBC   BMET   BNP Imaging: No results found.  Administration History     None          Latest Ref Rng & Units 05/22/2022    8:13 AM 08/30/2018   10:03 AM 07/19/2014   11:01 AM  PFT Results  FVC-Pre L 1.41  1.92  2.32   FVC-Predicted Pre % 42  69  85   FVC-Post L 1.51  2.13  2.36   FVC-Predicted Post % 46  77  86   Pre FEV1/FVC % % 84  83  71   Post FEV1/FCV % % 85  79  85   FEV1-Pre L 1.18  1.59  1.64   FEV1-Predicted Pre % 47  74  76   FEV1-Post L 1.29  1.69  2.00   DLCO uncorrected ml/min/mmHg 14.60  23.90  18.43   DLCO UNC% % 69  112  71   DLCO corrected ml/min/mmHg 14.60     DLCO COR %Predicted % 69     DLVA Predicted % 111  154  101   TLC L 3.59     TLC % Predicted % 67     RV % Predicted % 66       Lab Results  Component Value Date   NITRICOXIDE 56 08/30/2018        Assessment & Plan:   No problem-specific Assessment & Plan notes found for this encounter. Assessment and Plan Assessment & Plan COVID-19 pneumonia   Recent COVID-19 pneumonia required hospitalization with supplemental oxygen  and nebulizer treatments. She is currently improving and oxygen  requirements have returned to baseline. Order a chest x-ray to assess resolution . Clinically improved   Chronic respiratory failure with hypoxia  and hypercabia  Chronic respiratory failure with hypoxia   Oxygen  saturation is 97% without oxygen  at rest. Emphasize using supplemental oxygen  to prevent stress on vital organs if saturation drops below 90%. Use supplemental oxygen  to maintain saturation above 90% and perform periodic spot checks with a pulse oximeter. Continue on   Asthma   Asthma is managed with  budesonide  and albuterol  nebulizers. Discussed adding Brovana , LABA , to enhance airway opening. Previous use of Trelegy inhaler was discontinued due to cost. Begin Brovana  nebulizer treatment if affordable and send the prescription to [facility]. Continue budesonide  and albuterol  nebulizers.  Obstructive sleep apnea/OHS -continue on BIPAP ST At bedtime  . Compliance and control is excellent. Perceived benefit. BIPAP care discussed   D CHF w/ Chronic Lower extremity edema   Continue Lasix  as prescribed and advise leg elevation to reduce edema. Continue follow up with PCP and Cardiology   Allergic rhinitis   Allergic rhinitis is managed with Singulair  and Zyrtec . Continue Singulair  and Zyrtec .  Influenza vaccination   Influenza vaccination is due. Discussed the availability of the flu shot today. Administer the flu shot   Hx of Lung cancer -CT chest 11/23/23 stable with no evidence of recurrence. Continue follow up with Oncology   Right pectoralis muscle mass- stable on CT chest 11/2023 since 2015 .   Hx of Sarcoid -no active findings on CT chest May 2025. Stable scattered mediastinal and hilar lymph nodes.    Plan  Patient Instructions  Chest xray today .  Flu shot.  Continue on Budesonide   and Albuterol  neb Twice daily   Begin Brovana  neb Twice daily   Albuterol  inhaler or neb every 6hr as needed.  Singulair  10mg  At bedtime  Zyrtec  10mg  daily  Albuterol  inhaler or neb As needed   Continue on Oxygen  3-4 l/m to keep O2 sats >90%.  Continue on Lasix  40mg  daily  Continue on BIPAP ST machine At bedtime  and with naps.  Follow up with Dr. Darlean  or Amayiah Gosnell NP in 2-3 months and As needed   Please contact office for sooner follow up if symptoms do not improve or worsen or seek emergency care     I spent   42 minutes dedicated to the care of this patient on the date of this encounter to include pre-visit review of records, face-to-face time with the patient discussing conditions above,  post visit ordering of testing, clinical documentation with the electronic health record, making appropriate referrals as documented, and communicating necessary findings to members of the patients care team.   Madelin Stank, NP 04/11/2024

## 2024-04-13 ENCOUNTER — Ambulatory Visit: Payer: Self-pay | Admitting: Adult Health

## 2024-04-18 ENCOUNTER — Encounter: Payer: Self-pay | Admitting: Podiatry

## 2024-04-18 ENCOUNTER — Ambulatory Visit (INDEPENDENT_AMBULATORY_CARE_PROVIDER_SITE_OTHER): Admitting: Podiatry

## 2024-04-18 DIAGNOSIS — M79674 Pain in right toe(s): Secondary | ICD-10-CM

## 2024-04-18 DIAGNOSIS — M65972 Unspecified synovitis and tenosynovitis, left ankle and foot: Secondary | ICD-10-CM

## 2024-04-18 DIAGNOSIS — M79675 Pain in left toe(s): Secondary | ICD-10-CM

## 2024-04-18 DIAGNOSIS — B351 Tinea unguium: Secondary | ICD-10-CM | POA: Diagnosis not present

## 2024-04-18 MED ORDER — TRIAMCINOLONE ACETONIDE 10 MG/ML IJ SUSP
5.0000 mg | Freq: Once | INTRAMUSCULAR | Status: AC
  Administered 2024-04-18: 5 mg via INTRA_ARTICULAR

## 2024-04-18 NOTE — Patient Instructions (Signed)
 While at your visit today you received a steroid injection in your foot or ankle to help with your pain. Along with having the steroid medication there is some "numbing" medication in the shot that you received. Due to this you may notice some numbness to the area for the next couple of hours.  ? ?I would recommend limiting activity for the next few days to help the steroid injection take affect.  ?  ?The actually benefit from the steroid injection may take up to 2-7 days to see a difference. You may actually experience a small (as in 10%) INCREASE in pain in the first 24 hours---that is common. It would be best if you can ice the area today and take anti-inflammatory medications (such as Ibuprofen, Motrin, or Aleve) if you are able to take these medications. If you were prescribed another medication to help with the pain go ahead and start that medication today  ?  ?Things to watch out for that you should contact us  or a health care provider urgently would include: ?1. Unusual (as in more than 10%) increase in pain ?2. New fever > 101.5 ?3. New swelling or redness of the injected area.  ?4. Streaking of red lines around the area injected. ? ?If you have any questions or concerns about this, please give our office a call at [phone].  ? ? ?

## 2024-04-18 NOTE — Progress Notes (Signed)
 Subjective: Chief Complaint  Patient presents with   RFC    Rm11 Routine foot care      73 y.o. returns the office today for painful, elongated, thickened toenails which she cannot trim herself.  No open lesions or any swelling redness or drainage.  Also getting pain to left ankle still mostly in the lateral aspect of the ankle.  She does not report any recent injuries.  The injection previously was helpful.  Paseda, [name], FNP Last seen: 11/20/2023   Objective: AAO 3, NAD DP/PT pulses palpable, CRT less than 3 seconds Chronic lower extremity edema. Nails hypertrophic, dystrophic, elongated, brittle, discolored 10. There is tenderness overlying the nails 1-5 bilaterally. There is no surrounding erythema or drainage along the nail sites. No open lesions bilaterally.  There is tenderness palpation on sinus tarsi mostly today but somewhat anterolateral ankle joint as well.  There is chronic edema present to bilateral lower extremities.  Is no area pinpoint tenderness identified at this time.  There is no erythema or warmth. No pain with calf compression, swelling, warmth, erythema.  Assessment: Patient presents with symptomatic onychomycosis, hyperkeratotic lesion; capsulitis left ankle  Plan: Symptomatic onychomycosis -Nails sharply debrided 10 without complication/bleeding.   Capsulitis left ankle -She wishes to proceed with a steroid injection today.  Clean skin with Betadine, alcohol.  Mixture of 1 cc Kenalog  10, 0.5 cc of Marcaine plain, 0.5 cc of lidocaine  plain was infiltrated into the sinus tarsi without complications.  Postinjection care discussed.  Tolerated well.   Return in about 3 months (around 07/18/2024) for nail trim, ankle pain left.  Pamela [name] DPM

## 2024-05-02 DIAGNOSIS — J9612 Chronic respiratory failure with hypercapnia: Secondary | ICD-10-CM | POA: Diagnosis not present

## 2024-05-02 DIAGNOSIS — J449 Chronic obstructive pulmonary disease, unspecified: Secondary | ICD-10-CM | POA: Diagnosis not present

## 2024-05-08 ENCOUNTER — Ambulatory Visit: Payer: Self-pay | Admitting: Nurse Practitioner

## 2024-05-08 ENCOUNTER — Encounter: Payer: Self-pay | Admitting: Nurse Practitioner

## 2024-05-08 VITALS — BP 122/64 | HR 80 | Wt 305.0 lb

## 2024-05-08 DIAGNOSIS — N2581 Secondary hyperparathyroidism of renal origin: Secondary | ICD-10-CM | POA: Insufficient documentation

## 2024-05-08 DIAGNOSIS — R7303 Prediabetes: Secondary | ICD-10-CM | POA: Diagnosis not present

## 2024-05-08 DIAGNOSIS — R739 Hyperglycemia, unspecified: Secondary | ICD-10-CM | POA: Insufficient documentation

## 2024-05-08 DIAGNOSIS — I10 Essential (primary) hypertension: Secondary | ICD-10-CM

## 2024-05-08 DIAGNOSIS — J9611 Chronic respiratory failure with hypoxia: Secondary | ICD-10-CM | POA: Diagnosis not present

## 2024-05-08 DIAGNOSIS — R32 Unspecified urinary incontinence: Secondary | ICD-10-CM

## 2024-05-08 DIAGNOSIS — R252 Cramp and spasm: Secondary | ICD-10-CM | POA: Insufficient documentation

## 2024-05-08 DIAGNOSIS — E559 Vitamin D deficiency, unspecified: Secondary | ICD-10-CM | POA: Diagnosis not present

## 2024-05-08 DIAGNOSIS — J441 Chronic obstructive pulmonary disease with (acute) exacerbation: Secondary | ICD-10-CM

## 2024-05-08 DIAGNOSIS — D638 Anemia in other chronic diseases classified elsewhere: Secondary | ICD-10-CM

## 2024-05-08 DIAGNOSIS — N1832 Chronic kidney disease, stage 3b: Secondary | ICD-10-CM

## 2024-05-08 DIAGNOSIS — I5032 Chronic diastolic (congestive) heart failure: Secondary | ICD-10-CM | POA: Diagnosis not present

## 2024-05-08 DIAGNOSIS — E785 Hyperlipidemia, unspecified: Secondary | ICD-10-CM

## 2024-05-08 DIAGNOSIS — J9601 Acute respiratory failure with hypoxia: Secondary | ICD-10-CM | POA: Diagnosis not present

## 2024-05-08 DIAGNOSIS — J9612 Chronic respiratory failure with hypercapnia: Secondary | ICD-10-CM

## 2024-05-08 LAB — POCT GLYCOSYLATED HEMOGLOBIN (HGB A1C): Hemoglobin A1C: 5.3 % (ref 4.0–5.6)

## 2024-05-08 LAB — POC SOFIA 2 FLU + SARS ANTIGEN FIA
Influenza A, POC: NEGATIVE
Influenza B, POC: NEGATIVE
SARS Coronavirus 2 Ag: NEGATIVE

## 2024-05-08 MED ORDER — VITAMIN D3 25 MCG (1000 UT) PO CAPS: 1000.0000 [IU] | ORAL_CAPSULE | Freq: Every day | ORAL | 1 refills | Status: DC

## 2024-05-08 MED ORDER — METHYLPREDNISOLONE 4 MG PO TBPK: ORAL_TABLET | ORAL | 0 refills | Status: DC

## 2024-05-08 MED ORDER — AZITHROMYCIN 250 MG PO TABS: ORAL_TABLET | ORAL | 0 refills | Status: AC

## 2024-05-08 MED ORDER — PREDNISONE 20 MG PO TABS
40.0000 mg | ORAL_TABLET | Freq: Every day | ORAL | 0 refills | Status: AC
Start: 2024-05-08 — End: 2024-05-13

## 2024-05-08 MED ORDER — AZITHROMYCIN 250 MG PO TABS: ORAL_TABLET | ORAL | 0 refills | Status: DC

## 2024-05-08 NOTE — Assessment & Plan Note (Signed)
 Lab Results  Component Value Date   CHOL 113 06/30/2023   HDL 52 06/30/2023   LDLCALC 46 06/30/2023   TRIG 70 06/30/2023   CHOLHDL 2.2 06/30/2023  Controlled on Zetia  10 mg daily, atorvastatin  40 mg daily Lipid panel at next visit

## 2024-05-08 NOTE — Assessment & Plan Note (Signed)
 Chronic respiratory failure with hypoxia and hypercapnia Chronic condition managed with continuous oxygen  therapy 3 L via nasal cannula.

## 2024-05-08 NOTE — Assessment & Plan Note (Signed)
 Continue Calcitrol 0.25 mcg daily Maintain close follow-up with nephrologist

## 2024-05-08 NOTE — Assessment & Plan Note (Addendum)
 Lab Results  Component Value Date   NA 137 03/05/2024   K 4.8 03/05/2024   CO2 33 (H) 03/05/2024   GLUCOSE 135 (H) 03/05/2024   BUN 49 (H) 03/05/2024   CREATININE 1.50 (H) 03/05/2024   CALCIUM  8.5 (L) 03/05/2024   GFR 29.92 (L) 08/27/2022   EGFR 38 (L) 01/07/2024   GFRNONAA 37 (L) 03/05/2024  Avoid NSAIDs and other nephrotoxic agents Followed by nephrologist at [facility] Rechecking labs

## 2024-05-08 NOTE — Patient Instructions (Addendum)
 [facility] [phone]  1. Vitamin D  deficiency (Primary)  - VITAMIN D  25 Hydroxy (Vit-D Deficiency, Fractures)  2. Stage 3b chronic kidney disease (HCC)  - Basic Metabolic Panel  3. Hyperglycemia  - POCT glycosylated hemoglobin (Hb A1C)  4. COPD with acute exacerbation (HCC)  - azithromycin  (ZITHROMAX ) 250 MG tablet; Take 2 tablets on day 1, then 1 tablet daily on days 2 through 5  Dispense: 6 tablet; Refill: 0 - methylPREDNISolone  (MEDROL  DOSEPAK) 4 MG TBPK tablet; Take as instructed on the packaging  Dispense: 1 each; Refill: 0    It is important that you exercise regularly at least 30 minutes 5 times a week as tolerated  Think about what you will eat, plan ahead. Choose  clean, green, fresh or frozen over canned, processed or packaged foods which are more sugary, salty and fatty. 70 to 75% of food eaten should be vegetables and fruit. Three meals at set times with snacks allowed between meals, but they must be fruit or vegetables. Aim to eat over a 12 hour period , example 7 am to 7 pm, and STOP after  your last meal of the day. Drink water,generally about 64 ounces per day, no other drink is as healthy. Fruit juice is best enjoyed in a healthy way, by EATING the fruit.  Thanks for choosing [facility] we consider it a privelige to serve you.

## 2024-05-08 NOTE — Assessment & Plan Note (Addendum)
 Lab Results  Component Value Date   WBC 5.3 03/02/2024   HGB 10.0 (L) 03/02/2024   HCT 34.7 (L) 03/02/2024   MCV 75.4 (L) 03/02/2024   PLT 153 03/02/2024  Chronic medical condition, not on ESA Defer management to nephrologist

## 2024-05-08 NOTE — Assessment & Plan Note (Signed)
 Uses depends as needed.

## 2024-05-08 NOTE — Assessment & Plan Note (Signed)
 Lab Results  Component Value Date   HGBA1C 5.3 05/08/2024

## 2024-05-08 NOTE — Assessment & Plan Note (Signed)
 Last vitamin D  Lab Results  Component Value Date   VD25OH 16.2 (L) 01/07/2024  Ran out of vitamin D3 prescription. Low vitamin D  may contribute to leg cramps. - Recheck vitamin D  level. - Renew vitamin D3 prescription.

## 2024-05-08 NOTE — Assessment & Plan Note (Signed)
  Chronic leg cramps. - Recheck vitamin D  level and BMP

## 2024-05-08 NOTE — Assessment & Plan Note (Signed)
 Chronic diastolic heart failure No recent cardiology follow-up. No increased peripheral edema reported. Continue furosemide  40 mg daily, - Assist in contacting cardiology for follow-up appointment.

## 2024-05-08 NOTE — Assessment & Plan Note (Signed)
 BP Readings from Last 3 Encounters:  05/08/24 122/64  04/11/24 112/60  03/05/24 (!) 140/96  Controlled on furosemide  40 mg daily Continue current medication

## 2024-05-08 NOTE — Assessment & Plan Note (Signed)
 Acute cough and wheezing in patient with chronic respiratory failure and COPD Acute COPD exacerbation with increased wheezing and productive cough. - Order COVID-19 and flu tests.  Test  negative Prednisone  and azithromycin  ordered Continue albuterol  inhaler 2 puffs every 4 hours as needed, Proventil  2.5 mg nebulizer mg daily PRN , Brovana  nebulizer 15 mcg twice daily, Pulmicort  0.25 mg nebulizer twice daily as needed with albuterol 

## 2024-05-08 NOTE — Progress Notes (Addendum)
 Established Patient Office Visit  Subjective:  Patient ID: Pamela [name], female    DOB: [birth year]  Age: 73 y.o. MRN: [id number]  CC:  Chief Complaint  Patient presents with   Medical Management of Chronic Issues   Wheezing    HPI  Discussed the use of AI scribe software for clinical note transcription with the patient, who gave verbal consent to proceed.  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  who presents with increased wheezing and cough.  She has experienced increased wheezing and cough since Saturday, with the cough becoming productive with yellow sputum. She feels worse today compared to yesterday, when she felt well. No new shortness of breath, fever, or chest pain. She continues to use oxygen  at three liters continuously.  She  saw  pulmonologist last month. She uses her inhalers as prescribed, including albuterol  inhaler (two puffs every four hours as needed), nebulizer treatments with Brovana , and Pulmicort . She last took  prednisone  in August. She has not been using cough medication due to cost issues.  She received a flu shot recently and plans to get a COVID shot next month. She denies recent exposure to sick individuals.  She has not seen her cardiologist since last year due to relocation issues and has been unable to contact her for an appointment. She usually sees her once a year.  She experiences leg cramps, particularly in her thighs, which have been ongoing for years. She was taking vitamin D3 (1000 units daily) but has run out of the prescription.   Assessment &  Plan      Past Medical History:  Diagnosis Date   Arthritis    Asthma    Cancer (HCC)    lung, adenocarcinoma right lung 2012   CHF (congestive heart failure) (HCC)    Preserved EF   Congenital single kidney    With chronic kidney disease   COPD (chronic obstructive pulmonary disease) (HCC)    Gout    Hypertension    Lung cancer (HCC) 2012   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 Patient notes it was consistent with sarcoidosis but actual pathology results not available.   Oxygen  deficiency    Sarcoidosis    Secondary hyperparathyroidism of renal origin 05/08/2024   Sickle cell trait    Sleep apnea     Past Surgical History:  Procedure Laterality Date   LOOP RECORDER INSERTION N/A 09/08/2018   Procedure: LOOP RECORDER INSERTION;  Surgeon: Waddell Danelle ORN, MD;  Location: [facility]IVE [facility];  Service: Cardiovascular;  Laterality: N/A;   LUNG BIOPSY     TEE WITHOUT CARDIOVERSION N/A 09/08/2018   Procedure: TRANSESOPHAGEAL ECHOCARDIOGRAM (TEE);  Surgeon: Delford Maude BROCKS, MD;  Location: [facility] ENDOSCOPY;  Service: Cardiovascular;  Laterality: N/A;  with loop   TUBAL LIGATION     VEIN LIGATION AND STRIPPING      Family History  Problem Relation Age of Onset   Hypertension Mother    Renal Disease Mother  High blood pressure Mother    Heart disease Mother    Cancer Father        stomach   Heart disease Father        No details   Cervical cancer Sister    Diabetes Sister    Multiple myeloma Sister    Cancer Brother    Diabetes Brother     Social History   Socioeconomic History   Marital status: Divorced    Spouse name: Not on file   Number of children: 2   Years of education: Not on file   Highest education level: Not on file  Occupational History   Not on file  Tobacco Use   Smoking  status: Never   Smokeless tobacco: Never  Vaping Use   Vaping status: Never Used  Substance and Sexual Activity   Alcohol use: Not on file   Drug use: No   Sexual activity: Not Currently  Other Topics Concern   Not on file  Social History Narrative   Lives with son.  One son is deceased.     Social Drivers of Health   Financial Resource Strain: Medium Risk (02/24/2024)   Overall Financial Resource Strain (CARDIA)    Difficulty of Paying Living Expenses: Somewhat hard  Food Insecurity: No Food Insecurity (03/02/2024)   Hunger Vital Sign    Worried About Running Out of Food in the Last Year: Never true    Ran Out of Food in the Last Year: Never true  Transportation Needs: No Transportation Needs (03/02/2024)   PRAPARE - Administrator, Civil Service (Medical): No    Lack of Transportation (Non-Medical): No  Physical Activity: Insufficiently Active (02/24/2024)   Exercise Vital Sign    Days of Exercise per Week: 7 days    Minutes of Exercise per Session: 20 min  Stress: No Stress Concern Present (02/24/2024)   [employer] of Occupational Health - Occupational Stress Questionnaire    Feeling of Stress: Not at all  Social Connections: Moderately Integrated (03/01/2024)   Social Connection and Isolation Panel    Frequency of Communication with Friends and Family: More than three times a week    Frequency of Social Gatherings with Friends and Family: More than three times a week    Attends Religious Services: More than 4 times per year    Active Member of [employer] or Organizations: Yes    Attends Engineer, structural: More than 4 times per year    Marital Status: Divorced  Intimate Partner Violence: Not At Risk (03/02/2024)   Humiliation, Afraid, Rape, and Kick questionnaire    Fear of Current or Ex-Partner: No    Emotionally Abused: No    Physically Abused: No    Sexually Abused: No    Outpatient Medications Prior to Visit  Medication Sig Dispense Refill    acetaminophen  (TYLENOL ) 500 MG tablet Take 1,000 mg by mouth daily as needed for mild pain (pain score 1-3).     albuterol  (PROAIR  HFA) 108 (90 Base) MCG/ACT inhaler Inhale 2 puffs into the lungs every 4 (four) hours as needed for wheezing or shortness of breath. 8 g 5   albuterol  (PROVENTIL ) (2.5 MG/3ML) 0.083% nebulizer solution Take 3 mLs (2.5 mg total) by nebulization daily as needed for wheezing or shortness of breath. 270 mL 0   allopurinol  (ZYLOPRIM ) 100 MG tablet Take 0.5 tablets (50 mg total) by mouth daily. 60 tablet 2   arformoterol  (BROVANA ) 15 MCG/2ML NEBU Take 2  mLs (15 mcg total) by nebulization 2 (two) times daily. 120 mL 5   aspirin  EC 81 MG tablet Take 1 tablet (81 mg total) by mouth daily. Swallow whole. 30 tablet 11   atorvastatin  (LIPITOR) 40 MG tablet Take 1 tablet (40 mg total) by mouth daily. 90 tablet 0   B Complex-C (B-COMPLEX WITH VITAMIN C) tablet Take 1 tablet by mouth daily. 120 tablet 0   budesonide  (PULMICORT ) 0.25 MG/2ML nebulizer solution INHALE THE CONTENTS OF 1 VIAL VIA NEBULIZER TWICE DAILY AS DIRECTED WITH ALBUTEROL  360 mL 3   calcitRIOL  (ROCALTROL ) 0.25 MCG capsule Take 1 capsule (0.25 mcg total) by mouth daily. 90 capsule 3   cetirizine  (ZYRTEC ) 10 MG chewable tablet Chew 10 mg by mouth at bedtime.     dextromethorphan -guaiFENesin  (MUCINEX  DM) 30-600 MG 12hr tablet Take 1 tablet by mouth 2 (two) times daily as needed for cough. 60 tablet 0   diclofenac  Sodium (VOLTAREN ) 1 % GEL Apply 2 g topically 4 (four) times daily. 100 g 0   docusate sodium  (STOOL SOFTENER) 100 MG capsule Take 100 mg by mouth daily.     ezetimibe  (ZETIA ) 10 MG tablet Take 1 tablet (10 mg total) by mouth daily. 90 tablet 3   famotidine  (PEPCID ) 20 MG tablet TAKE 1 TABLET AFTER SUPPER 90 tablet 3   folic acid  (FOLVITE ) 1 MG tablet Take 1 tablet (1 mg total) by mouth daily. 120 tablet 0   furosemide  (LASIX ) 40 MG tablet Take 1 tablet (40 mg total) by mouth daily. 90 tablet 3   hydrOXYzine   (ATARAX ) 25 MG tablet Take 1 tablet (25 mg total) by mouth 3 (three) times daily as needed for itching. 30 tablet 0   Incontinence Supply Disposable (DEPEND UNDERWEAR LARGE/XL) MISC 1 each by Does not apply route 2 times daily at 12 noon and 4 pm. 17 each 12   montelukast  (SINGULAIR ) 10 MG tablet Take 1 tablet (10 mg total) by mouth at bedtime. 90 tablet 3   pantoprazole  (PROTONIX ) 40 MG tablet TAKE 1 TABLET EVERY DAY 30 TO 60 MINUTES BEFORE FIRST MEAL OF THE DAY 90 tablet 3   Cholecalciferol (VITAMIN D3) 25 MCG (1000 UT) CAPS Take 1 capsule (1,000 Units total) by mouth daily. 60 capsule 2   benzonatate  (TESSALON ) 100 MG capsule Take 1 capsule (100 mg total) by mouth 3 (three) times daily as needed for cough. (Patient not taking: Reported on 05/08/2024) 60 capsule 0   chlorpheniramine-HYDROcodone  (TUSSIONEX) 10-8 MG/5ML Take 5 mLs by mouth every 12 (twelve) hours. (Patient not taking: Reported on 05/08/2024) 473 mL 0   predniSONE  (DELTASONE ) 20 MG tablet Take 2 tablets (40 mg total) by mouth daily. (Patient not taking: Reported on 05/08/2024) 8 tablet 0   No facility-administered medications prior to visit.    Allergies  Allergen Reactions   Sulfa  Antibiotics Hives and Rash    ROS Review of Systems  Constitutional:  Negative for appetite change, chills, fatigue and fever.  HENT:  Negative for congestion, postnasal drip, rhinorrhea and sneezing.   Respiratory:  Positive for cough and wheezing. Negative for shortness of breath.   Cardiovascular:  Negative for chest pain, palpitations and leg swelling.  Gastrointestinal:  Negative for abdominal pain, constipation, nausea and vomiting.  Genitourinary:  Negative for difficulty urinating, dysuria, flank pain and frequency.  Musculoskeletal:  Negative for arthralgias, back pain, joint swelling and myalgias.  Skin:  Negative for color change, pallor, rash and wound.  Neurological:  Negative for dizziness, facial asymmetry,  weakness, numbness and  headaches.  Psychiatric/Behavioral:  Negative for behavioral problems, confusion, self-injury and suicidal ideas.       Objective:    Physical Exam Vitals and nursing note reviewed.  Constitutional:      General: She is not in acute distress.    Appearance: Normal appearance. She is obese. She is not ill-appearing, toxic-appearing or diaphoretic.  Eyes:     General: No scleral icterus.       Right eye: No discharge.        Left eye: No discharge.     Extraocular Movements: Extraocular movements intact.     Conjunctiva/sclera: Conjunctivae normal.  Cardiovascular:     Rate and Rhythm: Normal rate and regular rhythm.     Pulses: Normal pulses.     Heart sounds: Normal heart sounds. No murmur heard.    No friction rub. No gallop.  Pulmonary:     Effort: Pulmonary effort is normal. No respiratory distress.     Breath sounds: No stridor. Wheezing and rhonchi present. No rales.  Chest:     Chest wall: No tenderness.  Abdominal:     General: There is no distension.     Palpations: Abdomen is soft.     Tenderness: There is no abdominal tenderness. There is no right CVA tenderness, left CVA tenderness or guarding.  Musculoskeletal:        General: No swelling, tenderness, deformity or signs of injury.     Right lower leg: No edema.     Left lower leg: No edema.  Skin:    General: Skin is warm and dry.     Capillary Refill: Capillary refill takes less than 2 seconds.     Coloration: Skin is not jaundiced or pale.     Findings: No bruising, erythema or lesion.  Neurological:     Mental Status: She is alert and oriented to person, place, and time.     Motor: No weakness.     Gait: Gait abnormal.     Comments: Using a rolling walker for ambulation  Psychiatric:        Mood and Affect: Mood normal.        Behavior: Behavior normal.        Thought Content: Thought content normal.        Judgment: Judgment normal.     BP 122/64   Pulse 80   Wt (!) 305 lb (138.3 kg)   SpO2 94%    BMI 52.35 kg/m  Wt Readings from Last 3 Encounters:  05/08/24 (!) 305 lb (138.3 kg)  04/11/24 (!) 310 lb (140.6 kg)  03/01/24 283 lb 8.2 oz (128.6 kg)    Lab Results  Component Value Date   TSH 2.810 09/24/2022   Lab Results  Component Value Date   WBC 5.3 03/02/2024   HGB 10.0 (L) 03/02/2024   HCT 34.7 (L) 03/02/2024   MCV 75.4 (L) 03/02/2024   PLT 153 03/02/2024   Lab Results  Component Value Date   NA 137 03/05/2024   K 4.8 03/05/2024   CHLORIDE 103 05/31/2017   CO2 33 (H) 03/05/2024   GLUCOSE 135 (H) 03/05/2024   BUN 49 (H) 03/05/2024   CREATININE 1.50 (H) 03/05/2024   BILITOT 0.6 03/01/2024   ALKPHOS 128 (H) 03/01/2024   AST 31 03/01/2024   ALT 19 03/01/2024   PROT 7.7 03/01/2024   ALBUMIN 3.7 03/01/2024   CALCIUM  8.5 (L) 03/05/2024   ANIONGAP 8 03/05/2024   EGFR 38 (  L) 01/07/2024   GFR 29.92 (L) 08/27/2022   Lab Results  Component Value Date   CHOL 113 06/30/2023   Lab Results  Component Value Date   HDL 52 06/30/2023   Lab Results  Component Value Date   LDLCALC 46 06/30/2023   Lab Results  Component Value Date   TRIG 70 06/30/2023   Lab Results  Component Value Date   CHOLHDL 2.2 06/30/2023   Lab Results  Component Value Date   HGBA1C 5.3 05/08/2024      Assessment & Plan:   Problem List Items Addressed This Visit       Cardiovascular and Mediastinum   Essential hypertension   BP Readings from Last 3 Encounters:  05/08/24 122/64  04/11/24 112/60  03/05/24 (!) 140/96  Controlled on furosemide  40 mg daily Continue current medication      Chronic heart failure with preserved ejection fraction (HFpEF) (HCC)   Chronic diastolic heart failure No recent cardiology follow-up. No increased peripheral edema reported. Continue furosemide  40 mg daily, - Assist in contacting cardiology for follow-up appointment.           Respiratory   Chronic respiratory failure with hypoxia and hypercapnia (HCC)   Chronic respiratory  failure with hypoxia and hypercapnia Chronic condition managed with continuous oxygen  therapy 3 L via nasal cannula.        COPD with acute exacerbation (HCC)   Acute cough and wheezing in patient with chronic respiratory failure and COPD Acute COPD exacerbation with increased wheezing and productive cough. - Order COVID-19 and flu tests.  Test  negative Prednisone  and azithromycin  ordered Continue albuterol  inhaler 2 puffs every 4 hours as needed, Proventil  2.5 mg nebulizer mg daily PRN , Brovana  nebulizer 15 mcg twice daily, Pulmicort  0.25 mg nebulizer twice daily as needed with albuterol        Relevant Medications   predniSONE  (DELTASONE ) 20 MG tablet   azithromycin  (ZITHROMAX ) 250 MG tablet   Other Relevant Orders   POC SOFIA 2 FLU + SARS ANTIGEN FIA (Completed)     Endocrine   Secondary hyperparathyroidism of renal origin   Continue Calcitrol 0.25 mcg daily Maintain close follow-up with nephrologist        Genitourinary   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)   Lab Results  Component Value Date   NA 137 03/05/2024   K 4.8 03/05/2024   CO2 33 (H) 03/05/2024   GLUCOSE 135 (H) 03/05/2024   BUN 49 (H) 03/05/2024   CREATININE 1.50 (H) 03/05/2024   CALCIUM  8.5 (L) 03/05/2024   GFR 29.92 (L) 08/27/2022   EGFR 38 (L) 01/07/2024   GFRNONAA 37 (L) 03/05/2024  Avoid NSAIDs and other nephrotoxic agents Followed by nephrologist at [facility] Rechecking labs      Relevant Orders   Basic Metabolic Panel     Other   Prediabetes   Lab Results  Component Value Date   HGBA1C 5.3 05/08/2024         Relevant Orders   POCT glycosylated hemoglobin (Hb A1C) (Completed)   Anemia of chronic disease   Lab Results  Component Value Date   WBC 5.3 03/02/2024   HGB 10.0 (L) 03/02/2024   HCT 34.7 (L) 03/02/2024   MCV 75.4 (L) 03/02/2024   PLT 153 03/02/2024  Chronic medical condition, not on ESA Defer management to nephrologist      Vitamin D   deficiency - Primary   Last vitamin D  Lab Results  Component Value Date  VD25OH 16.2 (L) 01/07/2024  Ran out of vitamin D3 prescription. Low vitamin D  may contribute to leg cramps. - Recheck vitamin D  level. - Renew vitamin D3 prescription.       Relevant Medications   Cholecalciferol (VITAMIN D3) 25 MCG (1000 UT) CAPS   Other Relevant Orders   VITAMIN D  25 Hydroxy (Vit-D Deficiency, Fractures)   Dyslipidemia   Lab Results  Component Value Date   CHOL 113 06/30/2023   HDL 52 06/30/2023   LDLCALC 46 06/30/2023   TRIG 70 06/30/2023   CHOLHDL 2.2 06/30/2023  Controlled on Zetia  10 mg daily, atorvastatin  40 mg daily Lipid panel at next visit      Urinary incontinence   Uses depends as needed        Bilateral leg cramps    Chronic leg cramps. - Recheck vitamin D  level and BMP        Meds ordered this encounter  Medications   DISCONTD: azithromycin  (ZITHROMAX ) 250 MG tablet    Sig: Take 2 tablets on day 1, then 1 tablet daily on days 2 through 5    Dispense:  6 tablet    Refill:  0   DISCONTD: methylPREDNISolone  (MEDROL  DOSEPAK) 4 MG TBPK tablet    Sig: Take as instructed on the packaging    Dispense:  1 each    Refill:  0   predniSONE  (DELTASONE ) 20 MG tablet    Sig: Take 2 tablets (40 mg total) by mouth daily for 5 days.    Dispense:  10 tablet    Refill:  0   azithromycin  (ZITHROMAX ) 250 MG tablet    Sig: Take 2 tablets on day 1, then 1 tablet daily on days 2 through 5    Dispense:  6 tablet    Refill:  0   Cholecalciferol (VITAMIN D3) 25 MCG (1000 UT) CAPS    Sig: Take 1 capsule (1,000 Units total) by mouth daily.    Dispense:  90 capsule    Refill:  1    Follow-up: Return in about 4 months (around 09/08/2024).    Tifanie Gardiner R Saudia Smyser, FNP

## 2024-05-09 ENCOUNTER — Ambulatory Visit: Payer: Self-pay | Admitting: Nurse Practitioner

## 2024-05-09 LAB — BASIC METABOLIC PANEL WITH GFR
BUN/Creatinine Ratio: 17 (ref 12–28)
BUN: 24 mg/dL (ref 8–27)
CO2: 27 mmol/L (ref 20–29)
Calcium: 8.9 mg/dL (ref 8.7–10.3)
Chloride: 102 mmol/L (ref 96–106)
Creatinine, Ser: 1.41 mg/dL — ABNORMAL HIGH (ref 0.57–1.00)
Glucose: 84 mg/dL (ref 70–99)
Potassium: 4.4 mmol/L (ref 3.5–5.2)
Sodium: 145 mmol/L — ABNORMAL HIGH (ref 134–144)
eGFR: 40 mL/min/1.73 — ABNORMAL LOW (ref >59)

## 2024-05-09 LAB — VITAMIN D 25 HYDROXY (VIT D DEFICIENCY, FRACTURES): Vit D, 25-Hydroxy: 35.8 ng/mL (ref 30.0–100.0)

## 2024-05-09 NOTE — Telephone Encounter (Signed)
-----   Message from [name] Paseda sent at 05/09/2024  9:24 AM EDT ----- Kidney function is stable Sodium level is slightly elevated, stay hydrated and limit salt to less than 2 g a day  Vitamin D  level is normal, please continue vitamin D  1000 units daily  ----- Message ----- From: Victory Iha, RMA Sent: 05/08/2024   1:55 PM EDT To: [name] Paseda, FNP

## 2024-05-09 NOTE — Telephone Encounter (Signed)
 Called patient to go over lab results and discuss recommendations. Patient voiced understanding

## 2024-05-18 ENCOUNTER — Other Ambulatory Visit: Payer: Self-pay | Admitting: Nurse Practitioner

## 2024-05-31 ENCOUNTER — Telehealth: Payer: Self-pay

## 2024-05-31 NOTE — Telephone Encounter (Signed)
 Copied from CRM [id number]. Topic: General - Other >> May 31, 2024  9:07 AM Aleatha BROCKS wrote: Reason for CRM: Luke from [facility] will be sending Medication Request attention to FNP [facility]

## 2024-06-01 ENCOUNTER — Telehealth: Payer: Self-pay

## 2024-06-01 NOTE — Telephone Encounter (Signed)
 Received fax from [facility] requesting a paper refill of 3 nebulizer solutions through [facility]. Also confirmed that [facility] is listed as a pharmacy for this pt. Placed in [name]'s sign folder in B pod. Once signed, will fax back to [facility] at [phone]

## 2024-06-01 NOTE — Telephone Encounter (Signed)
 Fax confirmation received, NFN

## 2024-06-01 NOTE — Telephone Encounter (Signed)
 Faxed signed form to [facility] at [phone]

## 2024-06-02 NOTE — Telephone Encounter (Signed)
 Forms will be faxed. KH

## 2024-06-02 NOTE — Telephone Encounter (Signed)
 [facility] called check on status of refill rx

## 2024-06-08 NOTE — Telephone Encounter (Signed)
 Received the same prescription request via fax from [facility] - this has already been faxed as of 06/01/24, and the fax confirmation was received. I have refaxed the original request to [facility] at the fax number they provided, [phone].   Fax confirmation has been received, again. NFN.

## 2024-06-13 ENCOUNTER — Encounter: Payer: Self-pay | Admitting: Internal Medicine

## 2024-06-13 ENCOUNTER — Ambulatory Visit: Admitting: Internal Medicine

## 2024-06-13 VITALS — BP 144/84 | HR 78 | Temp 98.1°F | Ht 64.0 in | Wt 296.0 lb

## 2024-06-13 DIAGNOSIS — J441 Chronic obstructive pulmonary disease with (acute) exacerbation: Secondary | ICD-10-CM | POA: Diagnosis not present

## 2024-06-13 DIAGNOSIS — J9612 Chronic respiratory failure with hypercapnia: Secondary | ICD-10-CM

## 2024-06-13 DIAGNOSIS — J4540 Moderate persistent asthma, uncomplicated: Secondary | ICD-10-CM

## 2024-06-13 DIAGNOSIS — J9611 Chronic respiratory failure with hypoxia: Secondary | ICD-10-CM | POA: Diagnosis not present

## 2024-06-13 MED ORDER — PREDNISONE 10 MG PO TABS: ORAL_TABLET | ORAL | 0 refills | Status: DC

## 2024-06-13 MED ORDER — AZITHROMYCIN 250 MG PO TABS: ORAL_TABLET | ORAL | 0 refills | Status: DC

## 2024-06-13 NOTE — Progress Notes (Addendum)
 Subjective:    Patient ID: Pamela [name], female    DOB: 1950-07-29  MRN: [id number]  Brief patient profile:  73yobf never smoker but lots of second hand exp with 1st asthma attack in 1990s and on maint rx since late 90's  and freq courses of prednisone =  3-4 x per year despite advair and spiriva and freq saba referred to pulmonary clinic 04/27/2014 by Dr Alvia s/p CT Bx 06/25/11 > no path report epic  but per oncology = [id number] adenoca of lung >  RT only per pt RUL completed 09/2011.     History of Present Illness  04/27/2014 1st [facility] office visit/ Pamela [name]   Chief Complaint  Patient presents with   Pulmonary Consult    Referred by Dr. Tilford. Pt states that she was dxed with asthma and COPD a long time ago.  Pt recently moved to [place] from [place]  and states needs to establish with new pulmonary MD.  She c/o DOE and cough, and states that she feels these symptoms are currently under control.    on prednisone  10 mg daily  since late July 2015 and still on freq saba and 2lpm  Already used   2puffs proair  am of ov  rec Stop spiriva and advair Start dulera  100 Take 2 puffs first thing in am and then another 2 puffs about 12 hours later.  Work on inhaler technique:  relax and gently blow all the way out then take a nice smooth deep breath back in, triggering the inhaler at same time you start breathing in.  Hold for up to 5 seconds if you can.  Rinse and gargle with water when done Only use your albuterol  (proair ) as a rescue medication  If proair  not helping, then use the neb and if needing the neb more than occastional, then take prednisone  10 mg daily  Please schedule a follow up office visit in 4 weeks, sooner if needed with all inhalers in hand for pfts on return    Admit date: 08/16/2022 Discharge date: 08/25/2022      Recommendations for Outpatient Follow-up:  Follow up with PCP in 1-2 weeks Please obtain BMP/CBC in one week your next doctors visit.  Prednisone  and  iron  tablets prescribed.  Advised to follow-up outpatient pulmonary next 5 to 7 days. She tells me she is enough bronchodilators at home. 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  Over the course of several days patient started doing better, she completed course of doxycycline  in the hospital.  IV Solu-Medrol  was transitioned to p.o. prednisone . PT/OT recommended home health therefore arrangements were made.       Assessment & Plan:  Principal Problem:   COPD with acute exacerbation (HCC)   Chronic diastolic CHF (congestive heart failure) (HCC)   Stage 3b chronic kidney disease (HCC)   Prediabetes   Obstructive sleep apnea   Acute on chronic respiratory failure with hypoxia and hypercapnia (HCC)   Vitamin D  deficiency   Acute respiratory failure with hypercapnia (HCC)     Acute respiratory failure with hypoxia and hypercarbia likely due to COPD exacerbation: Overall  patient was very slow to improve and today her breath sounds are much clear.  She has completed course of doxycycline .  Transition to p.o. prednisone  for 3 more days.  Continue using home bronchodilators, she tells me she has enough supplies at home.  I have advised she follow-up outpatient with her pulmonary next 5 to 7 days.     Chronic diastolic CHF (congestive heart failure) (HCC) BN P unremarkable, 2D echo showed an EF of 60% no wall motion abnormality. Continue Lasix  40 mg orally daily   Chronic kidney disease stage IIIb:  Creatinine around baseline of 1.4.  On daily iron  at home      01/29/2023   f/u ov/Pamela [name] re: asthma/MO   maint on montelukast   /  qid alb/bud and gerd rx  though easily confused with details of care  Chief Complaint  Patient presents with   Follow-up    States using O2, having cough  with some clear sputum at time, DOE at times  Dyspnea:  says better  Cough: worse x one month but mucus clear  Sleeping: flat bed with 4 pillows no cough or sob  SABA ldz:djbd  not helping at qid alb / bud  02: 3lpm  Rec My office will be contacting you by phone for referral to ENT specialist   - if you don't hear back from my office within one week please call us  back or notify us  thru MyChart and we'll address it right away.  For cough > mucinex  dm 1200 mg every 12 hours and use flutter valve as much as you can  No change in your medications - if getting worse please return to Dr Pamela [name]  Please schedule a follow up office visit in 6-8  weeks, call sooner if needed with all medications /inhalers/ solutions in hand   NP recs  09/21/23  Begin Augmentin  Twice daily  for 1 week  Begin Prednisone  taper -begin tomorrow  Depo Medrol  120mg  IM shot today office.  Chest xray today  Mucinex  DM Twice daily  As needed  cough/congestion  Add Trelegy inhaler 1 puff daily until sample is gone.  Continue on Budesonide  and Albuterol  neb Twice daily   Albuterol  inhaler or neb every 6hr as needed.  Singulair  10mg  At bedtime  Zyrtec  10mg  daily  Albuterol  inhaler or neb As needed   Continue on Oxygen  3-4 l/m to keep O2 sats >90%.  Continue on Lasix  40mg  daily  Continue with follow up with Cardiology and Nephrology  Continue on BIPAP ST machine At bedtime  and with naps.     10/08/2023  f/u ov/Pamela [name] re: asthma chronic severe plus MO   maint on nothing  did no  bring meds Chief Complaint  Patient presents with   Follow-up    Breathing has improved since her last visit. She has not used albuterol  in the past couple of weeks.   Dyspnea:  rollator walking [employer]  Cough: none   Sleeping: BIPAP ST  02 plugged in  SABA use: none  02: 3lpm - 4lpm  Rec Resume you albuterol  / budesonide  twice daily  Bipap st per PCP recs      06/13/2024  f/u ov/Nickcole Bralley re: AB   maint on brovana / bud worse x 2 weeks  Chief Complaint  Patient presents with   Medical Management of Chronic Issues   Asthma    Increased SOB, wheezing and cough x 2 wks. Cough has been prod with clear sputum, sometimes with speck of  green.    Dyspnea:  rollator walking at [employer] and [employer]  Cough: mostly white worse p take off bipap ina m  Sleeping: flat bed / 4 pillows and bipap s noct  resp cc  SABA use: using albneb /pump 02: 3lpm 24/7    No obvious day to day or daytime variability or assoc excess/ purulent sputum or mucus plugs or hemoptysis or cp or chest tightness,   or overt sinus or hb symptoms.    Also denies any obvious fluctuation of symptoms with weather or environmental changes or other aggravating or alleviating factors except as outlined above   No unusual exposure hx or h/o childhood pna/ asthma or knowledge of premature birth.  Current Allergies, Complete Past Medical History, Past Surgical History, Family History, and Social History were reviewed in [employer] record.  ROS  The following are not active complaints unless bolded Hoarseness, sore throat/ globus  dysphagia, dental problems, itching, sneezing,  nasal congestion or discharge of excess mucus or purulent secretions, ear ache,   fever, chills, sweats, unintended wt loss or wt gain, classically pleuritic or exertional cp,  orthopnea pnd or arm/hand swelling  or leg swelling, presyncope, palpitations, abdominal pain, anorexia, nausea, vomiting, diarrhea  or change in bowel habits or change in bladder habits, change in stools or change in urine, dysuria, hematuria,  rash, arthralgias, visual complaints, headache, numbness, weakness or ataxia or problems with walking or coordination,  change in mood or   memory.        Current Meds  Medication Sig   acetaminophen  (TYLENOL ) 500 MG tablet Take 1,000 mg by mouth daily as needed for mild pain (pain score 1-3).   albuterol  (PROAIR  HFA) 108 (90 Base) MCG/ACT inhaler Inhale 2 puffs into the lungs every 4 (four) hours as needed for wheezing or shortness of breath.   albuterol  (PROVENTIL ) (2.5 MG/3ML) 0.083% nebulizer solution Take 3 mLs (2.5 mg total) by nebulization daily as needed for wheezing or shortness of breath.   allopurinol  (ZYLOPRIM ) 100 MG tablet Take 0.5 tablets (50 mg total) by mouth daily.   arformoterol  (BROVANA ) 15 MCG/2ML NEBU Take 2 mLs (15 mcg total) by nebulization 2 (two) times daily.   aspirin  EC 81 MG tablet Take 1 tablet (81 mg total) by mouth daily. Swallow whole.   atorvastatin  (LIPITOR) 40 MG tablet TAKE 1 TABLET BY MOUTH DAILY   azithromycin  (ZITHROMAX ) 250 MG tablet Take 2 on day one then 1 daily x 4 days   B Complex-C (B-COMPLEX WITH VITAMIN C) tablet Take 1 tablet by mouth daily.   budesonide  (PULMICORT ) 0.25 MG/2ML nebulizer solution INHALE THE CONTENTS OF 1 VIAL VIA NEBULIZER TWICE DAILY AS DIRECTED WITH ALBUTEROL    calcitRIOL  (ROCALTROL ) 0.25 MCG capsule Take 1 capsule (0.25 mcg total) by mouth daily.   cetirizine  (ZYRTEC ) 10 MG chewable tablet Chew 10 mg by mouth at bedtime.   Cholecalciferol (VITAMIN D3) 25 MCG (1000 UT) CAPS Take 1 capsule (1,000 Units total) by mouth daily.   dextromethorphan -guaiFENesin  (MUCINEX  DM) 30-600 MG 12hr tablet Take 1 tablet by mouth 2 (two) times daily as needed for cough.   diclofenac  Sodium (VOLTAREN ) 1 % GEL Apply 2 g topically 4 (four) times daily.   docusate sodium  (STOOL SOFTENER) 100 MG capsule Take 100 mg by mouth daily.   ezetimibe  (ZETIA ) 10 MG tablet Take 1 tablet (10 mg total) by mouth daily.   famotidine  (PEPCID ) 20 MG tablet TAKE 1 TABLET AFTER SUPPER   folic acid  (  FOLVITE ) 1 MG tablet Take 1 tablet (1 mg total) by mouth daily.   furosemide  (LASIX ) 40 MG tablet Take 1  tablet (40 mg total) by mouth daily.   hydrOXYzine  (ATARAX ) 25 MG tablet Take 1 tablet (25 mg total) by mouth 3 (three) times daily as needed for itching.   Incontinence Supply Disposable (DEPEND UNDERWEAR LARGE/XL) MISC 1 each by Does not apply route 2 times daily at 12 noon and 4 pm.   montelukast  (SINGULAIR ) 10 MG tablet Take 1 tablet (10 mg total) by mouth at bedtime.   pantoprazole  (PROTONIX ) 40 MG tablet TAKE 1 TABLET EVERY DAY 30 TO 60 MINUTES BEFORE FIRST MEAL OF THE DAY   predniSONE  (DELTASONE ) 10 MG tablet Take  4 each am x 2 days,   2 each am x 2 days,  1 each am x 2 days and stop   predniSONE  (DELTASONE ) 10 MG tablet Take  4 each am x 2 days,   2 each am x 2 days,  1 each am x 2 days and stop             Objective:   Physical Exam  Wts  06/13/2024 296 10/08/2023   304   01/29/2023   314   10/08/2022   327  08/27/2022     314  11/12/2021   354  11/12/2020   342 05/13/2020 325  04/01/2020  330   02/23/2019    354   08/30/2018  366   08/02/2018  360   11/30/2014   343   Vital signs reviewed  06/13/2024  - Note at rest 02 sats  95% on on 3lpm pulsed    General appearance:    chronically ill but quite bright and pleastn amb bf nad using rollator     HEENT : Oropharynx  clear   Nasal turbinates nl    NECK :  without  apparent JVD/ palpable Nodes/TM    LUNGS: no acc muscle use,  Min barrel  contour chest wall with bilateral distant  pan exp wheeze and  without cough on insp or exp maneuvers and min  Hyperresonant  to  percussion bilaterally    CV:  RRR  no s3 or murmur or increase in P2, and no edema   ABD:  soft and nontender    MS:  Nl gait/ ext warm without deformities Or obvious joint restrictions  calf tenderness, cyanosis or clubbing     SKIN: warm and dry without lesions    NEURO:  alert, approp, nl sensorium with  no motor or cerebellar deficits apparent.     Assessment & Plan Asthma, chronic obstructive, with acute exacerbation (HCC) Onset asthma 1990's  while living in [place]   pfts 07/31/13  FEV1  1.53 (58%) with ratio 66 > p saba ratio 74 FEV1 1.68 (64%)  - -04/27/2014  try dulera  100 2 bid  - PFT's 07/19/2014 FEV1 2.00 (93%) and ratio 85 after 21% improvement from saba with no inhalers x one week  - trial off acei 12/01/2014 due to pseudoasthma component > resolved  - Spirometry 06/01/2016  FEV1 1.06 (48%)  Ratio 61  - FENO 06/01/2016  =   85 on symbicort  80 x2 > increase to 160  - Prednisone  10 mg floor x years, rec increase  to 20 mg if needing neb  - 10/15/2016 reduced pred to 5 mg daily (gout flares with taper)  - Spirometry 02/22/2018  FEV1 1.23 (57%)  Ratio 71 with min curvature  -  FENO 02/22/2018  = 61 08/02/2018  After extensive coaching inhaler device,  effectiveness =    90%  FENO 08/30/2018  =  57 p symb 160 2bid > rec add singulair  10 mg daily  PFT's  08/30/2018  FEV1 1.69 (78 % ) ratio 0.79  p 54 % improvement from saba p symb 160 x2  prior to study with DLCO  112 % corrects to 154  % for alv volume   - 04/01/2020  Added pred x 6 days as Plan d  - 05/13/2020  After extensive coaching inhaler device,  effectiveness =    25%   Rec:  - Prednisone  10 mg take  4 each am x 2 days,   2 each am x 2 days,  1 each am x 2 days and stop  - will add back prednisone  x 6 d as plan D as above   Also added zpak   Re SABA :  I spent extra time with pt today reviewing appropriate use of albuterol  for prn use on exertion with the following points: 1) saba is for relief of sob that does not improve by walking a slower pace or resting but rather if the pt does not improve after trying this first. 2) If the pt is convinced, as many are, that saba helps recover from activity faster then it's easy to tell if this is the case by re-challenging : ie stop, take the inhaler, then p 5 minutes try the exact same activity (intensity of workload) that just caused the symptoms and see if they are substantially diminished or not after saba 3) if there is an activity  that reproducibly causes the symptoms, try the saba 15 min before the activity on alternate days   If in fact the saba really does help, then fine to continue to use it prn but advised may need to look closer at the maintenance regimen being used to achieve better control of airways disease with exertion.   Re cough : For cough/ congestion > mucinex  or mucinex  dm  up to maximum of  1200 mg every 12 hours and use the flutter valve as much as possible    Chronic respiratory failure with hypoxia and hypercapnia (HCC) Started 2lpm 24/7 in [place]  around 2013  See pfts 07/19/2014 with completely reversible airflow obst (so this is not copd) and a dlco of 71% so this is not ILD - HC03   05/26/16  = 34 - HC03    02/06/17  = 27  - HC03   08/25/22    = 34  - HCO3   07/28/23   = 28  - HC03   1020/27  = 27 on bipap and 3lpm 24/7  so clearly improving/ needs to continue bipap hs and titrate 02 for sats > 90% during the day          Each maintenance medication was reviewed in detail including emphasizing most importantly the difference between maintenance and prns and under what circumstances the prns are to be triggered using an action plan format where appropriate.  Total time for H and P, chart review, counseling, reviewing neb/ 02/pulse ox/ flutter valve device(s) and generating customized AVS unique to this office visit / same day charting = 20 min         AVS  Patient Instructions  Plan A = Automatic = Always=    brovana /bud 1st thing in am and 12 hours later  Plan B = Backup (to supplement  plan A, not to replace it) Use your albuterol  inhaler as a rescue medication to be used if you can't catch your breath by resting or slowing your pace  or doing a relaxed purse lip breathing pattern.  - The less you use it, the better it will work when you need it. - Ok to use the inhaler up to 2 puffs  every 4 hours if you must but call for appointment if use goes up over your usual need - Don't leave home  without it !!  (think of it like the spare tire or starter fluid for your car)   Plan C = Crisis (instead of Plan B but only if Plan B stops working) - only use your albuterol  nebulizer if you first try Plan B and it fails to help > ok to use the nebulizer up to every 4 hours but if start needing it regularly call for immediate appointment   Also  Ok to try albuterol  15 min before an activity (on alternating days)  that you know would usually make you short of breath and see if it makes any difference and if makes none then don't take albuterol  after activity unless you can't catch your breath as this means it's the resting that helps, not the albuterol .       Prednisone  10 mg take  4 each am x 2 days,   2 each am x 2 days,  1 each am x 2 days and stop (refillable rx sent in addition to this cycle )     Zpak   For cough/ congestion > mucinex  or mucinex  dm  up to maximum of  1200 mg every 12 hours and use the flutter valve as much as you can     Please schedule a follow up visit in 3 months but call sooner if needed - See [name] NP   Ozell America, MD 06/15/2024

## 2024-06-13 NOTE — Patient Instructions (Addendum)
 Plan A = Automatic = Always=    brovana /bud 1st thing in am and 12 hours later  Plan B = Backup (to supplement plan A, not to replace it) Use your albuterol  inhaler as a rescue medication to be used if you can't catch your breath by resting or slowing your pace  or doing a relaxed purse lip breathing pattern.  - The less you use it, the better it will work when you need it. - Ok to use the inhaler up to 2 puffs  every 4 hours if you must but call for appointment if use goes up over your usual need - Don't leave home without it !!  (think of it like the spare tire or starter fluid for your car)   Plan C = Crisis (instead of Plan B but only if Plan B stops working) - only use your albuterol  nebulizer if you first try Plan B and it fails to help > ok to use the nebulizer up to every 4 hours but if start needing it regularly call for immediate appointment   Also  Ok to try albuterol  15 min before an activity (on alternating days)  that you know would usually make you short of breath and see if it makes any difference and if makes none then don't take albuterol  after activity unless you can't catch your breath as this means it's the resting that helps, not the albuterol .       Prednisone  10 mg take  4 each am x 2 days,   2 each am x 2 days,  1 each am x 2 days and stop (refillable rx sent in addition to this cycle )     Zpak   For cough/ congestion > mucinex  or mucinex  dm  up to maximum of  1200 mg every 12 hours and use the flutter valve as much as you can     Please schedule a follow up visit in 3 months but call sooner if needed - See [name] NP

## 2024-06-15 MED ORDER — PREDNISONE 10 MG PO TABS: ORAL_TABLET | ORAL | 11 refills | Status: DC

## 2024-06-15 NOTE — Assessment & Plan Note (Addendum)
 Started 2lpm 24/7 in [place]  around 2013  See pfts 07/19/2014 with completely reversible airflow obst (so this is not copd) and a dlco of 71% so this is not ILD - HC03   05/26/16  = 34 - HC03    02/06/17  = 27  - HC03   08/25/22    = 34  - HCO3   07/28/23   = 28  - HC03   1020/27  = 27 on bipap and 3lpm 24/7  so clearly improving/ needs to continue bipap hs and titrate 02 for sats > 90% during the day          Each maintenance medication was reviewed in detail including emphasizing most importantly the difference between maintenance and prns and under what circumstances the prns are to be triggered using an action plan format where appropriate.  Total time for H and P, chart review, counseling, reviewing neb/ 02/pulse ox/ flutter valve device(s) and generating customized AVS unique to this office visit / same day charting = 20 min

## 2024-06-15 NOTE — Assessment & Plan Note (Addendum)
 Onset asthma 1990's while living in [place]   pfts 07/31/13  FEV1  1.53 (58%) with ratio 66 > p saba ratio 74 FEV1 1.68 (64%)  - -04/27/2014  try dulera  100 2 bid  - PFT's 07/19/2014 FEV1 2.00 (93%) and ratio 85 after 21% improvement from saba with no inhalers x one week  - trial off acei 12/01/2014 due to pseudoasthma component > resolved  - Spirometry 06/01/2016  FEV1 1.06 (48%)  Ratio 61  - FENO 06/01/2016  =   85 on symbicort  80 x2 > increase to 160  - Prednisone  10 mg floor x years, rec increase  to 20 mg if needing neb  - 10/15/2016 reduced pred to 5 mg daily (gout flares with taper)  - Spirometry 02/22/2018  FEV1 1.23 (57%)  Ratio 71 with min curvature  - FENO 02/22/2018  = 61 08/02/2018  After extensive coaching inhaler device,  effectiveness =    90%  FENO 08/30/2018  =  57 p symb 160 2bid > rec add singulair  10 mg daily  PFT's  08/30/2018  FEV1 1.69 (78 % ) ratio 0.79  p 54 % improvement from saba p symb 160 x2  prior to study with DLCO  112 % corrects to 154  % for alv volume   - 04/01/2020  Added pred x 6 days as Plan d  - 05/13/2020  After extensive coaching inhaler device,  effectiveness =    25%   Rec:  - Prednisone  10 mg take  4 each am x 2 days,   2 each am x 2 days,  1 each am x 2 days and stop  - will add back prednisone  x 6 d as plan D as above   Also added zpak   Re SABA :  I spent extra time with pt today reviewing appropriate use of albuterol  for prn use on exertion with the following points: 1) saba is for relief of sob that does not improve by walking a slower pace or resting but rather if the pt does not improve after trying this first. 2) If the pt is convinced, as many are, that saba helps recover from activity faster then it's easy to tell if this is the case by re-challenging : ie stop, take the inhaler, then p 5 minutes try the exact same activity (intensity of workload) that just caused the symptoms and see if they are substantially diminished or not after saba 3) if  there is an activity that reproducibly causes the symptoms, try the saba 15 min before the activity on alternate days   If in fact the saba really does help, then fine to continue to use it prn but advised may need to look closer at the maintenance regimen being used to achieve better control of airways disease with exertion.   Re cough : For cough/ congestion > mucinex  or mucinex  dm  up to maximum of  1200 mg every 12 hours and use the flutter valve as much as possible

## 2024-06-19 ENCOUNTER — Other Ambulatory Visit: Payer: Self-pay | Admitting: Nurse Practitioner

## 2024-06-19 DIAGNOSIS — Z1231 Encounter for screening mammogram for malignant neoplasm of breast: Secondary | ICD-10-CM

## 2024-07-16 ENCOUNTER — Emergency Department (HOSPITAL_COMMUNITY)

## 2024-07-16 ENCOUNTER — Encounter (HOSPITAL_COMMUNITY): Payer: Self-pay

## 2024-07-16 ENCOUNTER — Other Ambulatory Visit: Payer: Self-pay

## 2024-07-16 ENCOUNTER — Inpatient Hospital Stay (HOSPITAL_COMMUNITY)
Admission: EM | Admit: 2024-07-16 | Discharge: 2024-07-24 | DRG: 193 | Disposition: A | Discharge status: Home / Self Care | Attending: Family Medicine | Admitting: Family Medicine

## 2024-07-16 DIAGNOSIS — Z8049 Family history of malignant neoplasm of other genital organs: Secondary | ICD-10-CM | POA: Diagnosis not present

## 2024-07-16 DIAGNOSIS — Z807 Family history of other malignant neoplasms of lymphoid, hematopoietic and related tissues: Secondary | ICD-10-CM

## 2024-07-16 DIAGNOSIS — K219 Gastro-esophageal reflux disease without esophagitis: Secondary | ICD-10-CM | POA: Diagnosis present

## 2024-07-16 DIAGNOSIS — J441 Chronic obstructive pulmonary disease with (acute) exacerbation: Secondary | ICD-10-CM | POA: Diagnosis present

## 2024-07-16 DIAGNOSIS — Z833 Family history of diabetes mellitus: Secondary | ICD-10-CM

## 2024-07-16 DIAGNOSIS — E782 Mixed hyperlipidemia: Secondary | ICD-10-CM | POA: Diagnosis present

## 2024-07-16 DIAGNOSIS — Z6841 Body Mass Index (BMI) 40.0 and over, adult: Secondary | ICD-10-CM

## 2024-07-16 DIAGNOSIS — E662 Morbid (severe) obesity with alveolar hypoventilation: Secondary | ICD-10-CM | POA: Diagnosis present

## 2024-07-16 DIAGNOSIS — M109 Gout, unspecified: Secondary | ICD-10-CM | POA: Diagnosis not present

## 2024-07-16 DIAGNOSIS — G4733 Obstructive sleep apnea (adult) (pediatric): Secondary | ICD-10-CM | POA: Diagnosis not present

## 2024-07-16 DIAGNOSIS — Z1152 Encounter for screening for COVID-19: Secondary | ICD-10-CM

## 2024-07-16 DIAGNOSIS — N183 Chronic kidney disease, stage 3 unspecified: Secondary | ICD-10-CM | POA: Insufficient documentation

## 2024-07-16 DIAGNOSIS — Z882 Allergy status to sulfonamides status: Secondary | ICD-10-CM

## 2024-07-16 DIAGNOSIS — J45901 Unspecified asthma with (acute) exacerbation: Secondary | ICD-10-CM | POA: Diagnosis present

## 2024-07-16 DIAGNOSIS — Z8249 Family history of ischemic heart disease and other diseases of the circulatory system: Secondary | ICD-10-CM

## 2024-07-16 DIAGNOSIS — J189 Pneumonia, unspecified organism: Principal | ICD-10-CM | POA: Diagnosis present

## 2024-07-16 DIAGNOSIS — N1832 Chronic kidney disease, stage 3b: Secondary | ICD-10-CM | POA: Diagnosis present

## 2024-07-16 DIAGNOSIS — D509 Iron deficiency anemia, unspecified: Secondary | ICD-10-CM | POA: Diagnosis present

## 2024-07-16 DIAGNOSIS — I5032 Chronic diastolic (congestive) heart failure: Secondary | ICD-10-CM | POA: Diagnosis present

## 2024-07-16 DIAGNOSIS — M1A9XX0 Chronic gout, unspecified, without tophus (tophi): Secondary | ICD-10-CM | POA: Diagnosis present

## 2024-07-16 DIAGNOSIS — N2581 Secondary hyperparathyroidism of renal origin: Secondary | ICD-10-CM | POA: Diagnosis present

## 2024-07-16 DIAGNOSIS — Q600 Renal agenesis, unilateral: Secondary | ICD-10-CM

## 2024-07-16 DIAGNOSIS — I5030 Unspecified diastolic (congestive) heart failure: Secondary | ICD-10-CM

## 2024-07-16 DIAGNOSIS — Z7982 Long term (current) use of aspirin: Secondary | ICD-10-CM

## 2024-07-16 DIAGNOSIS — E66813 Obesity, class 3: Secondary | ICD-10-CM | POA: Diagnosis present

## 2024-07-16 DIAGNOSIS — I452 Bifascicular block: Secondary | ICD-10-CM | POA: Diagnosis present

## 2024-07-16 DIAGNOSIS — J9621 Acute and chronic respiratory failure with hypoxia: Secondary | ICD-10-CM | POA: Diagnosis present

## 2024-07-16 DIAGNOSIS — J440 Chronic obstructive pulmonary disease with acute lower respiratory infection: Secondary | ICD-10-CM | POA: Diagnosis present

## 2024-07-16 DIAGNOSIS — D573 Sickle-cell trait: Secondary | ICD-10-CM | POA: Diagnosis present

## 2024-07-16 DIAGNOSIS — Z79899 Other long term (current) drug therapy: Secondary | ICD-10-CM

## 2024-07-16 DIAGNOSIS — Z7951 Long term (current) use of inhaled steroids: Secondary | ICD-10-CM

## 2024-07-16 DIAGNOSIS — Z85118 Personal history of other malignant neoplasm of bronchus and lung: Secondary | ICD-10-CM

## 2024-07-16 DIAGNOSIS — I130 Hypertensive heart and chronic kidney disease with heart failure and stage 1 through stage 4 chronic kidney disease, or unspecified chronic kidney disease: Secondary | ICD-10-CM | POA: Diagnosis present

## 2024-07-16 DIAGNOSIS — D869 Sarcoidosis, unspecified: Secondary | ICD-10-CM | POA: Diagnosis present

## 2024-07-16 DIAGNOSIS — Z8419 Family history of other disorders of kidney and ureter: Secondary | ICD-10-CM

## 2024-07-16 DIAGNOSIS — J9601 Acute respiratory failure with hypoxia: Principal | ICD-10-CM

## 2024-07-16 LAB — BASIC METABOLIC PANEL WITH GFR
Anion gap: 11 (ref 5–15)
BUN: 16 mg/dL (ref 8–23)
CO2: 30 mmol/L (ref 22–32)
Calcium: 9.8 mg/dL (ref 8.9–10.3)
Chloride: 103 mmol/L (ref 98–111)
Creatinine, Ser: 1.1 mg/dL — ABNORMAL HIGH (ref 0.44–1.00)
GFR, Estimated: 53 mL/min — ABNORMAL LOW
Glucose, Bld: 100 mg/dL — ABNORMAL HIGH (ref 70–99)
Potassium: 4.8 mmol/L (ref 3.5–5.1)
Sodium: 144 mmol/L (ref 135–145)

## 2024-07-16 LAB — CBC
HCT: 38.2 % (ref 36.0–46.0)
Hemoglobin: 11 g/dL — ABNORMAL LOW (ref 12.0–15.0)
MCH: 22.1 pg — ABNORMAL LOW (ref 26.0–34.0)
MCHC: 28.8 g/dL — ABNORMAL LOW (ref 30.0–36.0)
MCV: 76.9 fL — ABNORMAL LOW (ref 80.0–100.0)
Platelets: 165 K/uL (ref 150–400)
RBC: 4.97 MIL/uL (ref 3.87–5.11)
RDW: 15.4 % (ref 11.5–15.5)
WBC: 5.5 K/uL (ref 4.0–10.5)
nRBC: 0 % (ref 0.0–0.2)

## 2024-07-16 LAB — TROPONIN T, HIGH SENSITIVITY
Troponin T High Sensitivity: 23 ng/L — ABNORMAL HIGH (ref 0–19)
Troponin T High Sensitivity: 24 ng/L — ABNORMAL HIGH (ref 0–19)

## 2024-07-16 LAB — RESP PANEL BY RT-PCR (RSV, FLU A&B, COVID)  RVPGX2
Influenza A by PCR: NEGATIVE
Influenza B by PCR: NEGATIVE
Resp Syncytial Virus by PCR: NEGATIVE
SARS Coronavirus 2 by RT PCR: NEGATIVE

## 2024-07-16 LAB — PRO BRAIN NATRIURETIC PEPTIDE: Pro Brain Natriuretic Peptide: 665 pg/mL — ABNORMAL HIGH

## 2024-07-16 MED ORDER — ENOXAPARIN SODIUM 40 MG/0.4ML IJ SOSY
40.0000 mg | PREFILLED_SYRINGE | INTRAMUSCULAR | Status: DC
  Administered 2024-07-17 – 2024-07-24 (×8): 40 mg via SUBCUTANEOUS
  Filled 2024-07-16: qty 0.4

## 2024-07-16 MED ORDER — SENNOSIDES-DOCUSATE SODIUM 8.6-50 MG PO TABS: 1.0000 | ORAL_TABLET | Freq: Every evening | ORAL | Status: DC | PRN

## 2024-07-16 MED ORDER — IPRATROPIUM-ALBUTEROL 0.5-2.5 (3) MG/3ML IN SOLN
3.0000 mL | Freq: Once | RESPIRATORY_TRACT | Status: AC
  Administered 2024-07-16: 3 mL via RESPIRATORY_TRACT
  Filled 2024-07-16: qty 3

## 2024-07-16 MED ORDER — SODIUM CHLORIDE 0.9 % IV SOLN
500.0000 mg | Freq: Once | INTRAVENOUS | Status: AC
  Administered 2024-07-16: 500 mg via INTRAVENOUS
  Filled 2024-07-16: qty 5

## 2024-07-16 MED ORDER — ACETAMINOPHEN 325 MG PO TABS
650.0000 mg | ORAL_TABLET | Freq: Four times a day (QID) | ORAL | Status: DC | PRN
  Administered 2024-07-17 – 2024-07-19 (×4): 650 mg via ORAL

## 2024-07-16 MED ORDER — IPRATROPIUM-ALBUTEROL 0.5-2.5 (3) MG/3ML IN SOLN
3.0000 mL | Freq: Four times a day (QID) | RESPIRATORY_TRACT | Status: DC
  Administered 2024-07-16 – 2024-07-24 (×30): 3 mL via RESPIRATORY_TRACT
  Filled 2024-07-16 (×3): qty 3

## 2024-07-16 MED ORDER — SODIUM CHLORIDE 0.9 % IV SOLN
1.0000 g | Freq: Once | INTRAVENOUS | Status: AC
  Administered 2024-07-16: 1 g via INTRAVENOUS
  Filled 2024-07-16: qty 10

## 2024-07-16 MED ORDER — METHYLPREDNISOLONE SODIUM SUCC 125 MG IJ SOLR
125.0000 mg | Freq: Once | INTRAMUSCULAR | Status: AC
  Administered 2024-07-16: 125 mg via INTRAVENOUS
  Filled 2024-07-16: qty 2

## 2024-07-16 MED ORDER — BISACODYL 5 MG PO TBEC: 5.0000 mg | DELAYED_RELEASE_TABLET | Freq: Every day | ORAL | Status: DC | PRN

## 2024-07-16 MED ORDER — ONDANSETRON HCL 4 MG/2ML IJ SOLN: 4.0000 mg | Freq: Four times a day (QID) | INTRAMUSCULAR | Status: DC | PRN

## 2024-07-16 MED ORDER — ACETAMINOPHEN 650 MG RE SUPP: 650.0000 mg | Freq: Four times a day (QID) | RECTAL | Status: DC | PRN

## 2024-07-16 MED ORDER — ONDANSETRON HCL 4 MG PO TABS: 4.0000 mg | ORAL_TABLET | Freq: Four times a day (QID) | ORAL | Status: DC | PRN

## 2024-07-16 NOTE — ED Triage Notes (Signed)
 Arrives POV with family for increased shob and cough. Baseline 3L nasal cannula.   Hx COPD.

## 2024-07-16 NOTE — ED Provider Triage Note (Signed)
 Emergency Medicine Provider Triage Evaluation Note  Pamela [name] , a 73 y.o. female  was evaluated in triage.  Pt complains of cough, congestion, shortness of breath, wheezing.  She notes that symptoms have been worsening since Christmas Day..  Review of Systems  Positive: Cough, congestion, wheezing Negative: Abdominal pain, nausea, vomiting, chest pain  Physical Exam  BP (!) 177/114 (BP Location: Right Arm)   Pulse 77   Temp 98.5 F (36.9 C) (Oral)   Resp 18   Ht 5' 4 (1.626 m)   Wt 133.8 kg   SpO2 91%   BMI 50.64 kg/m  Gen:   Awake, no distress   Resp:  Normal effort, diffuse wheezing MSK:   Moves extremities without difficulty  Other:    Medical Decision Making  Medically screening exam initiated at 7:45 PM.  Appropriate orders placed.  Pamela [name] was informed that the remainder of the evaluation will be completed by another provider, this initial triage assessment does not replace that evaluation, and the importance of remaining in the ED until their evaluation is complete.  Patient does remain stable at this time but does have diffuse wheezing on exam.  Labs, imaging, medications have been ordered.  Awaiting bed in the back at this time.   Pamela Lonni BIRCH, PA-C 07/16/24 1946

## 2024-07-16 NOTE — ED Notes (Signed)
 83 % on 3 L with ambulation.

## 2024-07-16 NOTE — H&P (Incomplete)
 " History and Physical  Pamela [name] FMW:[id number] DOB: 02/25/1951 DOA: 07/16/2024  PCP: Paseda, [name], FNP   Chief Complaint: Shortness of breath, cough  HPI: Pamela [name] is a 73 y.o. female with medical history significant for COPD/asthma, chronic respiratory failure with hypoxia on 3 LNC, chronic HFpEF, RUL adenocarcinoma s/p SBRT, sarcoidosis, HTN,  HLD, gout, chronic leg edema, CKD stage IIIB, morbid obesity, and OSA/OHS on BiPAP who presented to the ED for evaluation of shortness of breath and cough.  Patient reports that on Christmas, she started having increased shortness of breath, wheezing and productive cough of clear sputum. Over the last few days, she has attempted to manage her symptoms with her home inhalers and nebulizer treatments however the symptoms have continued to progress. She denies any chest pain, fevers, chills, nausea, vomiting, abdominal pain, dysuria or dizziness.  ED Course: Initial vitals show patient afebrile, RR 17-27, HR 70-80s, SBP 160s-180s, SpO2 97% on 3 L [place]. Initial labs overall unremarkable with no white count, normal renal function, flat troponin, proBNP 665 (normal for age), negative flu/RSV/COVID test. EKG shows sinus rhythm with incomplete RBBB and LAFB. CXR shows central peribronchial wall thickening bilaterally with streaky perihilar opacities. Pt received IV Solu-Medrol , multiple DuoNebs, IV Rocephin  and IV azithromycin .  Patient's O2 dropped to 83% with ambulation.  TRH was consulted for admission.   Review of Systems: Please see HPI for pertinent positives and negatives. A complete 10 system review of systems are otherwise negative.  Past Medical History:  Diagnosis Date   Arthritis    Asthma    Cancer (HCC)    lung, adenocarcinoma right lung 2012   CHF (congestive heart failure) (HCC)    Preserved EF   Congenital single kidney    With chronic kidney disease   COPD (chronic obstructive pulmonary disease) (HCC)    Gout     Hypertension    Lung cancer (HCC) 2012   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 Patient notes it was consistent with sarcoidosis but actual pathology results not available.   Oxygen  deficiency    Sarcoidosis    Secondary hyperparathyroidism of renal origin 05/08/2024   Sickle cell trait    Sleep apnea    Past Surgical History:  Procedure Laterality Date   LOOP RECORDER INSERTION N/A 09/08/2018   Procedure: LOOP RECORDER INSERTION;  Surgeon: Waddell Danelle ORN, MD;  Location: [facility]IVE [facility];  Service: Cardiovascular;  Laterality: N/A;   LUNG BIOPSY     TEE WITHOUT CARDIOVERSION N/A 09/08/2018   Procedure: TRANSESOPHAGEAL ECHOCARDIOGRAM (TEE);  Surgeon: Delford Maude BROCKS, MD;  Location: [facility] ENDOSCOPY;  Service: Cardiovascular;  Laterality: N/A;  with loop   TUBAL LIGATION     VEIN LIGATION AND STRIPPING     Social History:  reports that she has never smoked. She has never used smokeless tobacco. She reports that she does not use drugs. No history on file for alcohol use.  Allergies[1]  Family History  Problem Relation Age of Onset   Hypertension Mother    Renal Disease Mother    High blood pressure Mother    Heart disease Mother    Cancer Father        stomach   Heart disease Father        No details   Cervical cancer  Sister    Diabetes Sister    Multiple myeloma Sister    Cancer Brother    Diabetes Brother      Prior to Admission medications  Medication Sig Start Date End Date Taking? Authorizing Provider  acetaminophen  (TYLENOL ) 500 MG tablet Take 1,000 mg by mouth daily as needed for mild pain (pain score 1-3).    [provider]  albuterol  (PROAIR  HFA) 108 (90 Base) MCG/ACT inhaler Inhale 2 puffs into the lungs every 4 (four) hours as needed for wheezing or shortness of breath. 03/17/24    Paseda, [name], FNP  albuterol  (PROVENTIL ) (2.5 MG/3ML) 0.083% nebulizer solution Take 3 mLs (2.5 mg total) by nebulization daily as needed for wheezing or shortness of breath. 03/05/24 06/13/24  Rizwan, Saima, MD  allopurinol  (ZYLOPRIM ) 100 MG tablet Take 0.5 tablets (50 mg total) by mouth daily. 03/31/24   Paseda, [name], FNP  arformoterol  (BROVANA ) 15 MCG/2ML NEBU Take 2 mLs (15 mcg total) by nebulization 2 (two) times daily. 04/11/24   Parrett, Madelin RAMAN, NP  aspirin  EC 81 MG tablet Take 1 tablet (81 mg total) by mouth daily. Swallow whole. 04/26/20   Chandrasekhar, Stanly LABOR, MD  atorvastatin  (LIPITOR) 40 MG tablet TAKE 1 TABLET BY MOUTH DAILY 05/18/24   Paseda, [name], FNP  azithromycin  (ZITHROMAX ) 250 MG tablet Take 2 on day one then 1 daily x 4 days 06/13/24   Darlean Ozell NOVAK, MD  B Complex-C (B-COMPLEX WITH VITAMIN C) tablet Take 1 tablet by mouth daily. 03/04/23   Tobie Yetta HERO, MD  benzonatate  (TESSALON ) 100 MG capsule Take 1 capsule (100 mg total) by mouth 3 (three) times daily as needed for cough. Patient not taking: Reported on 06/13/2024 03/05/24   Rizwan, Saima, MD  budesonide  (PULMICORT ) 0.25 MG/2ML nebulizer solution INHALE THE CONTENTS OF 1 VIAL VIA NEBULIZER TWICE DAILY AS DIRECTED WITH ALBUTEROL  12/06/23   Darlean Ozell NOVAK, MD  calcitRIOL  (ROCALTROL ) 0.25 MCG capsule Take 1 capsule (0.25 mcg total) by mouth daily. 03/31/24   Paseda, [name], FNP  cetirizine  (ZYRTEC ) 10 MG chewable tablet Chew 10 mg by mouth at bedtime.    [provider]  Cholecalciferol (VITAMIN D3) 25 MCG (1000 UT) CAPS Take 1 capsule (1,000 Units total) by mouth daily. 05/08/24   Paseda, [name], FNP  dextromethorphan -guaiFENesin  (MUCINEX  DM) 30-600 MG 12hr tablet Take 1 tablet by mouth 2 (two) times daily as needed for cough. 03/05/24   Earley Saucer, MD  diclofenac  Sodium (VOLTAREN ) 1 % GEL Apply 2 g topically 4 (four) times daily. 03/03/23   Tobie Yetta HERO, MD  docusate sodium  (STOOL  SOFTENER) 100 MG capsule Take 100 mg by mouth daily.    [provider]  ezetimibe  (ZETIA ) 10 MG tablet Take 1 tablet (10 mg total) by mouth daily. 03/17/24 06/15/24  Paseda, [name], FNP  famotidine  (PEPCID ) 20 MG tablet TAKE 1 TABLET AFTER SUPPER 03/17/24   Paseda, [name], FNP  folic acid  (FOLVITE ) 1 MG tablet Take 1 tablet (1 mg total) by mouth daily. 03/04/23   Tobie Yetta HERO, MD  furosemide  (LASIX ) 40 MG tablet Take 1 tablet (40 mg total) by mouth daily. 03/17/24   Paseda, [name], FNP  hydrOXYzine  (ATARAX ) 25 MG tablet Take 1 tablet (25 mg total) by mouth 3 (three) times daily as needed for itching. 03/31/24   Paseda, [name], FNP  Incontinence Supply Disposable (DEPEND UNDERWEAR LARGE/XL) MISC 1 each by Does not apply route 2 times daily at  12 noon and 4 pm. 07/28/22   Tilford Bertram HERO, FNP  montelukast  (SINGULAIR ) 10 MG tablet Take 1 tablet (10 mg total) by mouth at bedtime. 03/17/24   Paseda, [name], FNP  pantoprazole  (PROTONIX ) 40 MG tablet TAKE 1 TABLET EVERY DAY 30 TO 60 MINUTES BEFORE FIRST MEAL OF THE DAY 03/31/24   Paseda, [name], FNP  predniSONE  (DELTASONE ) 10 MG tablet Take  4 each am x 2 days,   2 each am x 2 days,  1 each am x 2 days and stop 06/13/24   [name], Michael B, MD  predniSONE  (DELTASONE ) 10 MG tablet Take  4 each am x 2 days,   2 each am x 2 days,  1 each am x 2 days and stop 06/15/24   Darlean Ozell NOVAK, MD    Physical Exam: BP (!) 163/79   Pulse 80   Temp 98.1 F (36.7 C) (Oral)   Resp (!) 22   Ht 5' 4 (1.626 m)   Wt 133.8 kg   SpO2 97%   BMI 50.64 kg/m  General: Pleasant, morbidly obese elderly woman laying in bed laying in bed. No acute distress. HEENT: [place]/AT. Anicteric sclera CV: RRR. No murmurs, rubs, or gallops.  Pulmonary: On 3 L [place]. Lungs CTAB. Normal effort. Moderate expiratory and inspiratory wheezing in the upper lung fields. Abdominal: Soft, nontender, nondistended. Normal bowel sounds. Extremities: Chronic nonpitting  BLE swelling, R> L.  Skin: Warm and dry. No obvious rash or lesions. Neuro: A&Ox3. Moves all extremities. Normal sensation to light touch. No focal deficit. Psych: Normal mood and affect          Labs on Admission:  Basic Metabolic Panel: Recent Labs  Lab 07/16/24 1955  NA 144  K 4.8  CL 103  CO2 30  GLUCOSE 100*  BUN 16  CREATININE 1.10*  CALCIUM  9.8   Liver Function Tests: No results for input(s): AST, ALT, ALKPHOS, BILITOT, PROT, ALBUMIN in the last 168 hours. No results for input(s): LIPASE, AMYLASE in the last 168 hours. No results for input(s): AMMONIA in the last 168 hours. CBC: Recent Labs  Lab 07/16/24 1955  WBC 5.5  HGB 11.0*  HCT 38.2  MCV 76.9*  PLT 165   Cardiac Enzymes: No results for input(s): CKTOTAL, CKMB, CKMBINDEX, TROPONINI in the last 168 hours. BNP (last 3 results) Recent Labs    03/01/24 1434  BNP 58.8    ProBNP (last 3 results) Recent Labs    07/16/24 1955  PROBNP 665.0*    CBG: No results for input(s): GLUCAP in the last 168 hours.  Radiological Exams on Admission: DG Chest 2 View Result Date: 07/16/2024 EXAM: 2 VIEW(S) XRAY OF THE CHEST 07/16/2024 07:37:00 PM COMPARISON: 04/11/2024 CLINICAL HISTORY: shortness of breath FINDINGS: LUNGS AND PLEURA: There is central peribronchial wall thickening bilaterally with streaky perihilar opacities, right greater than left. No pleural effusion. No pneumothorax. HEART AND MEDIASTINUM: Left chest cardiac loop recorder or leadless pacemaker in place. Aortic arch calcifications. BONES AND SOFT TISSUES: Thoracic spondylosis. IMPRESSION: 1. Central peribronchial wall thickening bilaterally with streaky perihilar opacities pacemaker for infection/inflammation. Electronically signed by: Greig Pique MD 07/16/2024 08:02 PM EST RP Workstation: [id number]   Assessment/Plan Pamela [name] is a 73 y.o. female with medical history significant for COPD/asthma, chronic respiratory  failure with hypoxia on 3 LNC, chronic HFpEF, RUL adenocarcinoma s/p SBRT, sarcoidosis, HTN,  HLD, gout, chronic leg edema, CKD stage IIIB, and OSA/OHS on BiPAP who presented to the ED for  evaluation of shortness of breath and cough and admitted for COPD/asthma exacerbation.  # COPD/asthma exacerbation # Chronic hypoxic respiratory failure - Presented with progressive shortness of breath, wheezing and cough - Pt with expiratory wheezes on lung auscultation, remains on baseline 3 L [place] - CXR with no evidence of PNA; Neg covid, RSV and flu test - S/p IV solumedrol, multiple DuoNebs in the ED - Start IV Solu-Medrol  80 mg daily and as needed DuoNebs - Start daily azithromycin  500 mg x 3 doses and as needed Robitussin - Continue home Pulmicort , Brovana  and Singulair  - IF, flutter valve  # Chronic HFpEF # Chronic lower extremity edema - Last TTE in 2024 showed EF 60-65%, no evidence of acute exacerbation - Continue Lasix   # CKD 3B - Creatinine of 1.10 stable compared to baseline of 1.1-1.4 - Continue home Calcitrol - Trend renal function, and avoid nephrotoxic agents  # HLD - Continue atorvastatin  and Zetia   # Gout - Continue allopurinol   # GERD - Continue Protonix  and famotidine   # OSA/OHS - Continue BiPAP at night  # Morbid obesity Body mass index is 50.64 kg/m. Filed Weights   07/16/24 1917  Weight: 133.8 kg  - F/u with PCP for weight lost and nutrition counseling  DVT prophylaxis: Lovenox      Code Status: Full Code  Consults called: None  Family Communication: No family at bedside  Severity of Illness: The appropriate patient status for this patient is INPATIENT. Inpatient status is judged to be reasonable and necessary in order to provide the required intensity of service to ensure the patient's safety. The patient's presenting symptoms, physical exam findings, and initial radiographic and laboratory data in the context of their chronic comorbidities is felt to place  them at high risk for further clinical deterioration. Furthermore, it is not anticipated that the patient will be medically stable for discharge from the hospital within 2 midnights of admission.   * I certify that at the point of admission it is my clinical judgment that the patient will require inpatient hospital care spanning beyond 2 midnights from the point of admission due to high intensity of service, high risk for further deterioration and high frequency of surveillance required.*  Level of care: Telemetry   I personally spent a total of 75 minutes in the care of the patient today including preparing to see the patient, getting/reviewing separately obtained history, performing a medically appropriate exam/evaluation, placing orders, and documenting clinical information in the EHR.   Lou Claretta HERO, MD 07/17/2024, 1:13 AM [facility] Pager: [phone] Isaiah 41:10   If 7PM-7AM, please contact night-coverage [email or web address] Password [email or web address]     [1]  Allergies Allergen Reactions   Sulfa  Antibiotics Hives and Rash   "

## 2024-07-16 NOTE — ED Provider Notes (Signed)
 " [place] EMERGENCY DEPARTMENT AT [facility] Provider Note   CSN: [id number] Arrival date & time: 07/16/24  8151     Patient presents with: Shortness of Breath   Pamela [name] is a 73 y.o. female.   This is a 73 year old female who is here today for 2 days of cough and shortness of breath.  She has a history of COPD, is on 3 L at baseline.  She reports that since Christmas, she has been increasingly short of breath.  She denies fever, chills, myalgias.   Shortness of Breath      Prior to Admission medications  Medication Sig Start Date End Date Taking? Authorizing Provider  acetaminophen  (TYLENOL ) 500 MG tablet Take 1,000 mg by mouth daily as needed for mild pain (pain score 1-3).    [provider]  albuterol  (PROAIR  HFA) 108 (90 Base) MCG/ACT inhaler Inhale 2 puffs into the lungs every 4 (four) hours as needed for wheezing or shortness of breath. 03/17/24   Paseda, [name], FNP  albuterol  (PROVENTIL ) (2.5 MG/3ML) 0.083% nebulizer solution Take 3 mLs (2.5 mg total) by nebulization daily as needed for wheezing or shortness of breath. 03/05/24 06/13/24  Rizwan, Saima, MD  allopurinol  (ZYLOPRIM ) 100 MG tablet Take 0.5 tablets (50 mg total) by mouth daily. 03/31/24   Paseda, [name], FNP  arformoterol  (BROVANA ) 15 MCG/2ML NEBU Take 2 mLs (15 mcg total) by nebulization 2 (two) times daily. 04/11/24   Parrett, Madelin RAMAN, NP  aspirin  EC 81 MG tablet Take 1 tablet (81 mg total) by mouth daily. Swallow whole. 04/26/20   Chandrasekhar, Stanly LABOR, MD  atorvastatin  (LIPITOR) 40 MG tablet TAKE 1 TABLET BY MOUTH DAILY 05/18/24   Paseda, [name], FNP  azithromycin  (ZITHROMAX ) 250 MG tablet Take 2 on day one then 1 daily x 4 days 06/13/24   Darlean Ozell NOVAK, MD  B Complex-C (B-COMPLEX WITH VITAMIN C) tablet Take 1 tablet by mouth daily. 03/04/23   Tobie Yetta HERO, MD  benzonatate  (TESSALON ) 100 MG capsule Take 1 capsule (100 mg total) by mouth 3 (three) times daily as  needed for cough. Patient not taking: Reported on 06/13/2024 03/05/24   Rizwan, Saima, MD  budesonide  (PULMICORT ) 0.25 MG/2ML nebulizer solution INHALE THE CONTENTS OF 1 VIAL VIA NEBULIZER TWICE DAILY AS DIRECTED WITH ALBUTEROL  12/06/23   [name], Michael B, MD  calcitRIOL  (ROCALTROL ) 0.25 MCG capsule Take 1 capsule (0.25 mcg total) by mouth daily. 03/31/24   Paseda, [name], FNP  cetirizine  (ZYRTEC ) 10 MG chewable tablet Chew 10 mg by mouth at bedtime.    [provider]  Cholecalciferol (VITAMIN D3) 25 MCG (1000 UT) CAPS Take 1 capsule (1,000 Units total) by mouth daily. 05/08/24   Paseda, [name], FNP  dextromethorphan -guaiFENesin  (MUCINEX  DM) 30-600 MG 12hr tablet Take 1 tablet by mouth 2 (two) times daily as needed for cough. 03/05/24   Rizwan, Saima, MD  diclofenac  Sodium (VOLTAREN ) 1 % GEL Apply 2 g topically 4 (four) times daily. 03/03/23   Tobie Yetta HERO, MD  docusate sodium  (STOOL SOFTENER) 100 MG capsule Take 100 mg by mouth daily.    [provider]  ezetimibe  (ZETIA ) 10 MG tablet Take 1 tablet (10 mg total) by mouth daily. 03/17/24 06/15/24  Paseda, [name], FNP  famotidine  (PEPCID ) 20 MG tablet TAKE 1 TABLET AFTER SUPPER 03/17/24   Paseda, [name], FNP  folic acid  (FOLVITE ) 1 MG tablet Take 1 tablet (1 mg total) by mouth daily. 03/04/23  Tobie Yetta HERO, MD  furosemide  (LASIX ) 40 MG tablet Take 1 tablet (40 mg total) by mouth daily. 03/17/24   Paseda, [name], FNP  hydrOXYzine  (ATARAX ) 25 MG tablet Take 1 tablet (25 mg total) by mouth 3 (three) times daily as needed for itching. 03/31/24   Paseda, [name], FNP  Incontinence Supply Disposable (DEPEND UNDERWEAR LARGE/XL) MISC 1 each by Does not apply route 2 times daily at 12 noon and 4 pm. 07/28/22   Tilford Bertram HERO, FNP  montelukast  (SINGULAIR ) 10 MG tablet Take 1 tablet (10 mg total) by mouth at bedtime. 03/17/24   Paseda, [name], FNP  pantoprazole  (PROTONIX ) 40 MG tablet TAKE 1 TABLET EVERY DAY 30 TO  60 MINUTES BEFORE FIRST MEAL OF THE DAY 03/31/24   Paseda, [name], FNP  predniSONE  (DELTASONE ) 10 MG tablet Take  4 each am x 2 days,   2 each am x 2 days,  1 each am x 2 days and stop 06/13/24   [name], Michael B, MD  predniSONE  (DELTASONE ) 10 MG tablet Take  4 each am x 2 days,   2 each am x 2 days,  1 each am x 2 days and stop 06/15/24   Darlean Ozell NOVAK, MD    Allergies: Sulfa  antibiotics    Review of Systems  Respiratory:  Positive for shortness of breath.     Updated Vital Signs BP (!) 163/79   Pulse 71   Temp 98.5 F (36.9 C) (Oral)   Resp 17   Ht 5' 4 (1.626 m)   Wt 133.8 kg   SpO2 100%   BMI 50.64 kg/m   Physical Exam Vitals and nursing note reviewed.  Constitutional:      Appearance: She is ill-appearing. She is not toxic-appearing.  Cardiovascular:     Rate and Rhythm: Normal rate.  Pulmonary:     Effort: Tachypnea present.     Breath sounds: Wheezing and rhonchi present.  Chest:     Chest wall: No mass or tenderness.  Musculoskeletal:        General: Normal range of motion.     Cervical back: Normal range of motion.  Skin:    General: Skin is warm.  Neurological:     General: No focal deficit present.     Mental Status: She is alert.     (all labs ordered are listed, but only abnormal results are displayed) Labs Reviewed  BASIC METABOLIC PANEL WITH GFR - Abnormal; Notable for the following components:      Result Value   Glucose, Bld 100 (*)    Creatinine, Ser 1.10 (*)    GFR, Estimated 53 (*)    All other components within normal limits  CBC - Abnormal; Notable for the following components:   Hemoglobin 11.0 (*)    MCV 76.9 (*)    MCH 22.1 (*)    MCHC 28.8 (*)    All other components within normal limits  PRO BRAIN NATRIURETIC PEPTIDE - Abnormal; Notable for the following components:   Pro Brain Natriuretic Peptide 665.0 (*)    All other components within normal limits  TROPONIN T, HIGH SENSITIVITY - Abnormal; Notable for the following  components:   Troponin T High Sensitivity 23 (*)    All other components within normal limits  TROPONIN T, HIGH SENSITIVITY - Abnormal; Notable for the following components:   Troponin T High Sensitivity 24 (*)    All other components within normal limits  RESP PANEL BY RT-PCR (  RSV, FLU A&B, COVID)  [id number]  PROCALCITONIN    EKG: None  Radiology: DG Chest 2 View Result Date: 07/16/2024 EXAM: 2 VIEW(S) XRAY OF THE CHEST 07/16/2024 07:37:00 PM COMPARISON: 04/11/2024 CLINICAL HISTORY: shortness of breath FINDINGS: LUNGS AND PLEURA: There is central peribronchial wall thickening bilaterally with streaky perihilar opacities, right greater than left. No pleural effusion. No pneumothorax. HEART AND MEDIASTINUM: Left chest cardiac loop recorder or leadless pacemaker in place. Aortic arch calcifications. BONES AND SOFT TISSUES: Thoracic spondylosis. IMPRESSION: 1. Central peribronchial wall thickening bilaterally with streaky perihilar opacities pacemaker for infection/inflammation. Electronically signed by: Greig Pique MD 07/16/2024 08:02 PM EST RP Workstation: [id number]     .Critical Care  Performed by: Mannie Fairy DASEN, DO Authorized by: Mannie Fairy DASEN, DO   Critical care provider statement:    Critical care time (minutes):  45   Critical care was necessary to treat or prevent imminent or life-threatening deterioration of the following conditions:  Respiratory failure   Critical care was time spent personally by me on the following activities:  Blood draw for specimens, development of treatment plan with patient or surrogate, discussions with consultants, evaluation of patient's response to treatment, examination of patient, obtaining history from patient or surrogate, ordering and review of radiographic studies, pulse oximetry, re-evaluation of patient's condition and review of old charts    Medications Ordered in the ED  azithromycin  (ZITHROMAX ) 500 mg in sodium chloride  0.9 % 250 mL  IVPB (500 mg Intravenous New Bag/Given 07/16/24 2211)  ipratropium-albuterol  (DUONEB) 0.5-2.5 (3) MG/3ML nebulizer solution 3 mL (3 mLs Nebulization Given 07/16/24 2053)  ipratropium-albuterol  (DUONEB) 0.5-2.5 (3) MG/3ML nebulizer solution 3 mL (3 mLs Nebulization Given 07/16/24 2053)  methylPREDNISolone  sodium succinate (SOLU-MEDROL ) 125 mg/2 mL injection 125 mg (125 mg Intravenous Given 07/16/24 2102)  cefTRIAXone  (ROCEPHIN ) 1 g in sodium chloride  0.9 % 100 mL IVPB (0 g Intravenous Stopped 07/16/24 2210)                                    Medical Decision Making 73 year old female here today for shortness of breath.  Differential diagnoses include COPD exacerbation, consider pneumonia, consider influenza.  Plan-with breathing treatments and steroids, was able get the patient on her baseline O2.  Unfortunately with ambulation, patient's O2 sat drops to 83%.  She remains pretty tight, wheezy.  Will continue to give her albuterol  and breathing treatments.  She does not have a leukocytosis, her chest x-ray shows possible streaky infiltrates.  Given her symptoms, we will start her on antibiotics.  Have ordered a procalcitonin to help see if we can potentially wean her off of antibiotics while admitted.  Discussed with hospitalist.  She is appropriate for admission.  Troponin flat, no pulmonary edema on chest x-ray.  I reviewed the patient's most recent hospital discharge summary.  Amount and/or Complexity of Data Reviewed Labs: ordered. Radiology: ordered.  Risk Decision regarding hospitalization.        Final diagnoses:  Acute hypoxic respiratory failure (HCC)  COPD exacerbation [facility])    ED Discharge Orders     None          Mannie Fairy DASEN, DO 07/16/24 2227  "

## 2024-07-17 DIAGNOSIS — E662 Morbid (severe) obesity with alveolar hypoventilation: Secondary | ICD-10-CM | POA: Diagnosis not present

## 2024-07-17 DIAGNOSIS — J45901 Unspecified asthma with (acute) exacerbation: Secondary | ICD-10-CM

## 2024-07-17 DIAGNOSIS — E782 Mixed hyperlipidemia: Secondary | ICD-10-CM

## 2024-07-17 DIAGNOSIS — J441 Chronic obstructive pulmonary disease with (acute) exacerbation: Secondary | ICD-10-CM | POA: Diagnosis not present

## 2024-07-17 DIAGNOSIS — M109 Gout, unspecified: Secondary | ICD-10-CM

## 2024-07-17 LAB — COMPREHENSIVE METABOLIC PANEL WITH GFR
ALT: 30 U/L (ref 0–44)
AST: 32 U/L (ref 15–41)
Albumin: 3.9 g/dL (ref 3.5–5.0)
Alkaline Phosphatase: 152 U/L — ABNORMAL HIGH (ref 38–126)
Anion gap: 12 (ref 5–15)
BUN: 18 mg/dL (ref 8–23)
CO2: 29 mmol/L (ref 22–32)
Calcium: 9.6 mg/dL (ref 8.9–10.3)
Chloride: 103 mmol/L (ref 98–111)
Creatinine, Ser: 1.08 mg/dL — ABNORMAL HIGH (ref 0.44–1.00)
GFR, Estimated: 55 mL/min — ABNORMAL LOW
Glucose, Bld: 199 mg/dL — ABNORMAL HIGH (ref 70–99)
Potassium: 4.4 mmol/L (ref 3.5–5.1)
Sodium: 144 mmol/L (ref 135–145)
Total Bilirubin: 0.2 mg/dL (ref 0.0–1.2)
Total Protein: 7.2 g/dL (ref 6.5–8.1)

## 2024-07-17 LAB — CBC
HCT: 38.2 % (ref 36.0–46.0)
Hemoglobin: 11 g/dL — ABNORMAL LOW (ref 12.0–15.0)
MCH: 22.2 pg — ABNORMAL LOW (ref 26.0–34.0)
MCHC: 28.8 g/dL — ABNORMAL LOW (ref 30.0–36.0)
MCV: 77.2 fL — ABNORMAL LOW (ref 80.0–100.0)
Platelets: 171 K/uL (ref 150–400)
RBC: 4.95 MIL/uL (ref 3.87–5.11)
RDW: 15.2 % (ref 11.5–15.5)
WBC: 4.1 K/uL (ref 4.0–10.5)
nRBC: 0 % (ref 0.0–0.2)

## 2024-07-17 LAB — PROCALCITONIN: Procalcitonin: <0.1 ng/mL

## 2024-07-17 MED ORDER — EZETIMIBE 10 MG PO TABS
10.0000 mg | ORAL_TABLET | Freq: Every day | ORAL | Status: DC
  Administered 2024-07-17 – 2024-07-24 (×8): 10 mg via ORAL
  Filled 2024-07-17 (×8): qty 1

## 2024-07-17 MED ORDER — HYDRALAZINE HCL 20 MG/ML IJ SOLN
10.0000 mg | Freq: Three times a day (TID) | INTRAMUSCULAR | Status: DC | PRN
  Administered 2024-07-17: 10 mg via INTRAVENOUS
  Filled 2024-07-17: qty 1

## 2024-07-17 MED ORDER — ALBUTEROL SULFATE (2.5 MG/3ML) 0.083% IN NEBU
2.5000 mg | INHALATION_SOLUTION | RESPIRATORY_TRACT | Status: DC | PRN
  Administered 2024-07-17 – 2024-07-18 (×3): 2.5 mg via RESPIRATORY_TRACT
  Filled 2024-07-17 (×4): qty 3

## 2024-07-17 MED ORDER — ARFORMOTEROL TARTRATE 15 MCG/2ML IN NEBU
15.0000 ug | INHALATION_SOLUTION | Freq: Two times a day (BID) | RESPIRATORY_TRACT | Status: DC
  Administered 2024-07-17 – 2024-07-24 (×15): 15 ug via RESPIRATORY_TRACT
  Filled 2024-07-17 (×15): qty 2

## 2024-07-17 MED ORDER — MONTELUKAST SODIUM 10 MG PO TABS
10.0000 mg | ORAL_TABLET | Freq: Every day | ORAL | Status: DC
  Administered 2024-07-17 – 2024-07-23 (×8): 10 mg via ORAL
  Filled 2024-07-17 (×8): qty 1

## 2024-07-17 MED ORDER — METHYLPREDNISOLONE SODIUM SUCC 125 MG IJ SOLR
80.0000 mg | Freq: Every day | INTRAMUSCULAR | Status: DC
  Administered 2024-07-17 – 2024-07-22 (×6): 80 mg via INTRAVENOUS
  Filled 2024-07-17 (×6): qty 2

## 2024-07-17 MED ORDER — CALCITRIOL 0.25 MCG PO CAPS
0.2500 ug | ORAL_CAPSULE | Freq: Every day | ORAL | Status: DC
  Administered 2024-07-17 – 2024-07-24 (×8): 0.25 ug via ORAL
  Filled 2024-07-17 (×9): qty 1

## 2024-07-17 MED ORDER — ATORVASTATIN CALCIUM 40 MG PO TABS
40.0000 mg | ORAL_TABLET | Freq: Every day | ORAL | Status: DC
  Administered 2024-07-17 – 2024-07-24 (×8): 40 mg via ORAL
  Filled 2024-07-17 (×8): qty 1

## 2024-07-17 MED ORDER — ALLOPURINOL 100 MG PO TABS
50.0000 mg | ORAL_TABLET | Freq: Every day | ORAL | Status: AC
  Administered 2024-07-17 – 2024-07-24 (×8): 50 mg via ORAL
  Filled 2024-07-17 (×8): qty 1

## 2024-07-17 MED ORDER — GUAIFENESIN 100 MG/5ML PO LIQD
5.0000 mL | ORAL | Status: DC | PRN
  Administered 2024-07-17 – 2024-07-24 (×21): 5 mL via ORAL
  Filled 2024-07-17 (×22): qty 10

## 2024-07-17 MED ORDER — FAMOTIDINE 20 MG PO TABS
20.0000 mg | ORAL_TABLET | Freq: Every evening | ORAL | Status: DC
  Administered 2024-07-17 – 2024-07-23 (×7): 20 mg via ORAL
  Filled 2024-07-17 (×7): qty 1

## 2024-07-17 MED ORDER — AZITHROMYCIN 500 MG PO TABS
500.0000 mg | ORAL_TABLET | Freq: Every day | ORAL | Status: AC
  Administered 2024-07-17 – 2024-07-19 (×3): 500 mg via ORAL
  Filled 2024-07-17: qty 2
  Filled 2024-07-17: qty 1
  Filled 2024-07-17: qty 2

## 2024-07-17 MED ORDER — PANTOPRAZOLE SODIUM 40 MG PO TBEC
40.0000 mg | DELAYED_RELEASE_TABLET | Freq: Every day | ORAL | Status: DC
  Administered 2024-07-17 – 2024-07-24 (×8): 40 mg via ORAL
  Filled 2024-07-17 (×8): qty 1

## 2024-07-17 MED ORDER — BUDESONIDE 0.25 MG/2ML IN SUSP
0.2500 mg | Freq: Two times a day (BID) | RESPIRATORY_TRACT | Status: DC
  Administered 2024-07-17 – 2024-07-24 (×15): 0.25 mg via RESPIRATORY_TRACT
  Filled 2024-07-17 (×15): qty 2

## 2024-07-17 MED ORDER — FUROSEMIDE 40 MG PO TABS
40.0000 mg | ORAL_TABLET | Freq: Every day | ORAL | Status: DC
  Administered 2024-07-17 – 2024-07-24 (×8): 40 mg via ORAL
  Filled 2024-07-17 (×4): qty 1
  Filled 2024-07-17: qty 2
  Filled 2024-07-17 (×3): qty 1

## 2024-07-17 NOTE — ED Notes (Signed)
 Pt denies pain at this time . Patient request meal . Given breakfast sandwich and juice. Cough decreased at this time .

## 2024-07-17 NOTE — Progress Notes (Signed)
 " Progress Note   Patient: Pamela [name] FMW:[id number] DOB: 21-Feb-1951 DOA: 07/16/2024     1 DOS: the patient was seen and examined on 07/17/2024   Brief hospital course: Pamela [name] is a 73 y.o. female with medical history significant for COPD/asthma, chronic respiratory failure with hypoxia on 3 LNC, chronic HFpEF, RUL adenocarcinoma s/p SBRT, sarcoidosis, HTN,  HLD, gout, chronic leg edema, CKD stage IIIB, morbid obesity, and OSA/OHS on BiPAP who presented to the ED for evaluation of shortness of breath and cough.  Patient reports that on Christmas, she started having increased shortness of breath, wheezing and productive cough of clear sputum. Over the last few days, she has attempted to manage her symptoms with her home inhalers and nebulizer treatments however the symptoms have continued to progress. She denies any chest pain, fevers, chills, nausea, vomiting, abdominal pain, dysuria or dizziness.   ED Course: Initial vitals show patient afebrile, RR 17-27, HR 70-80s, SBP 160s-180s, SpO2 97% on 3 L [place]. Initial labs overall unremarkable with no white count, normal renal function, flat troponin, proBNP 665 (normal for age), negative flu/RSV/COVID test. EKG shows sinus rhythm with incomplete RBBB and LAFB. CXR shows central peribronchial wall thickening bilaterally with streaky perihilar opacities. Pt received IV Solu-Medrol , multiple DuoNebs, IV Rocephin  and IV azithromycin .  Patient's O2 dropped to 83% with ambulation.  TRH was consulted for admission.   Further hospital course and management as outlined below.  12/29 - pt feeling better, on her baseline 3 L/min o2 this AM.  Continues to have diffuse wheezing on exam.   Assessment and Plan:   # COPD/asthma exacerbation # Chronic hypoxic respiratory failure - Presented with progressive shortness of breath, wheezing and cough - Pt with expiratory wheezes on lung auscultation, remains on baseline 3 L [place] - CXR with no evidence of PNA;  Neg covid, RSV and flu test - S/p IV solumedrol, multiple DuoNebs in the ED - Continue IV Solu-Medrol  80 mg daily  - DuoNebs QID & PRN albuterol  nebs - Continue PO Zithromax   - PRN Robitussin - Continue home Pulmicort , Brovana  and Singulair  - IF, flutter valve   Chronic HFpEF Chronic lower extremity edema - Last TTE in 2024 showed EF 60-65%, no evidence of acute exacerbation - Continue Lasix  - Monitor volume status   CKD 3B - Creatinine of 1.10 stable compared to baseline of 1.1-1.4 - Continue home Calcitrol - Trend renal function, and avoid nephrotoxic agents   HLD - Continue atorvastatin  and Zetia   Gout - Continue allopurinol    GERD - Continue Protonix  and famotidine    OSA/OHS - Continue BiPAP at night / during sleep   Morbid obesity Body mass index is 50.64 kg/m. Complicates overall care and prognosis.  Recommend lifestyle modifications including physical activity and diet for weight loss and overall long-term health.       Subjective: Pt seen in the ER this morning only for a bed.  She reports feeling much better than yesterday.  She confirms her baseline O2 requirement is 3 L and wears BiPAP at night and during sleep.  Denies fevers chills, chest pain shortness of breath at rest or other acute complaints at this time.  Physical Exam: Vitals:   07/17/24 1045 07/17/24 1100 07/17/24 1115 07/17/24 1120  BP:  (!) 147/65    Pulse: 90 81 90   Resp: 17 (!) 22 19   Temp:    98 F (36.7 C)  TempSrc:    Oral  SpO2: 99% 100% 100%   Weight:  Height:       General exam: awake, alert, no acute distress, obese HEENT: moist mucus membranes, hearing grossly normal  Respiratory system: Diffuse expiratory wheezes but good air movement, normal respiratory effort at rest, on 3 L/min [place] O2. Cardiovascular system: normal S1/S2, RRR, bilateral lower extremity edema noted.   Gastrointestinal system: soft, NT, ND Central nervous system: A&O x3. no gross focal neurologic  deficits, normal speech Extremities: Significant bilateral lower extremity edema patient reports a stable baseline Skin: dry, intact, normal temperature Psychiatry: normal mood, congruent affect, judgement and insight appear normal  Data Reviewed:  Notable labs- CMP notable for glucose 199, creatinine stable 1.08, alk phos 152 CBC with hemoglobin 11.0 stable Pro-Cal negative less than 0.10  Family Communication: None present at bedside  Disposition: Status is: Inpatient Remains inpatient appropriate because: Requires further clinical improvement prior to safe discharge, remains with diffuse wheezing on exam and on IV steroids.    Planned Discharge Destination: Home    Time spent: 45 minutes  Author: Burnard DELENA Cunning, DO 07/17/2024 1:31 PM  For on call review [email or web address].  "

## 2024-07-17 NOTE — ED Notes (Signed)
 Emptied pt's cannister; 500 ml

## 2024-07-18 ENCOUNTER — Ambulatory Visit: Admitting: Podiatry

## 2024-07-18 DIAGNOSIS — J441 Chronic obstructive pulmonary disease with (acute) exacerbation: Secondary | ICD-10-CM | POA: Diagnosis not present

## 2024-07-18 LAB — BASIC METABOLIC PANEL WITH GFR
Anion gap: 7 (ref 5–15)
BUN: 24 mg/dL — ABNORMAL HIGH (ref 8–23)
CO2: 34 mmol/L — ABNORMAL HIGH (ref 22–32)
Calcium: 8.9 mg/dL (ref 8.9–10.3)
Chloride: 101 mmol/L (ref 98–111)
Creatinine, Ser: 1.26 mg/dL — ABNORMAL HIGH (ref 0.44–1.00)
GFR, Estimated: 45 mL/min — ABNORMAL LOW
Glucose, Bld: 102 mg/dL — ABNORMAL HIGH (ref 70–99)
Potassium: 4.7 mmol/L (ref 3.5–5.1)
Sodium: 142 mmol/L (ref 135–145)

## 2024-07-18 LAB — CBC
HCT: 32.8 % — ABNORMAL LOW (ref 36.0–46.0)
Hemoglobin: 9.7 g/dL — ABNORMAL LOW (ref 12.0–15.0)
MCH: 22.4 pg — ABNORMAL LOW (ref 26.0–34.0)
MCHC: 29.6 g/dL — ABNORMAL LOW (ref 30.0–36.0)
MCV: 75.8 fL — ABNORMAL LOW (ref 80.0–100.0)
Platelets: 164 K/uL (ref 150–400)
RBC: 4.33 MIL/uL (ref 3.87–5.11)
RDW: 15.5 % (ref 11.5–15.5)
WBC: 5.4 K/uL (ref 4.0–10.5)
nRBC: 0 % (ref 0.0–0.2)

## 2024-07-18 MED ORDER — ASPIRIN 81 MG PO TBEC
81.0000 mg | DELAYED_RELEASE_TABLET | Freq: Every day | ORAL | Status: DC
  Administered 2024-07-18 – 2024-07-24 (×7): 81 mg via ORAL
  Filled 2024-07-18 (×7): qty 1

## 2024-07-18 MED ORDER — LORATADINE 10 MG PO TABS
10.0000 mg | ORAL_TABLET | Freq: Every day | ORAL | Status: DC
  Administered 2024-07-18 – 2024-07-24 (×7): 10 mg via ORAL
  Filled 2024-07-18 (×7): qty 1

## 2024-07-18 MED ORDER — FOLIC ACID 1 MG PO TABS
1.0000 mg | ORAL_TABLET | Freq: Every day | ORAL | Status: DC
  Administered 2024-07-18 – 2024-07-24 (×7): 1 mg via ORAL
  Filled 2024-07-18 (×7): qty 1

## 2024-07-18 MED ORDER — HYDROXYZINE HCL 25 MG PO TABS: 25.0000 mg | ORAL_TABLET | Freq: Three times a day (TID) | ORAL | Status: DC | PRN

## 2024-07-18 MED ORDER — DOCUSATE SODIUM 100 MG PO CAPS
100.0000 mg | ORAL_CAPSULE | Freq: Every day | ORAL | Status: DC
  Administered 2024-07-18 – 2024-07-24 (×7): 100 mg via ORAL
  Filled 2024-07-18 (×7): qty 1

## 2024-07-18 NOTE — Progress Notes (Signed)
" ° °  Brief Progress Note   _____________________________________________________________________________________________________________  Patient Name: Pamela [name] Patient DOB: 01/05/1951 Date: @TODAY @      Data: Reviewed labs, VS, notes.    Action: No action needed at this time.     Response:    _____________________________________________________________________________________________________________  [facility] RN Expeditor Sharolyn JONETTA Batman Please contact us  directly via secure chat (search for [facility]) or by calling us  at [phone] [facility]).  "

## 2024-07-18 NOTE — Plan of Care (Incomplete)
" °  Problem: Education: Goal: Knowledge of disease or condition will improve Outcome: Progressing Goal: Knowledge of the prescribed therapeutic regimen will improve Outcome: Progressing Goal: Individualized Educational Video(s) Outcome: Progressing   Problem: Activity: Goal: Ability to tolerate increased activity will improve Outcome: Progressing Goal: Will verbalize the importance of balancing activity with adequate rest periods Outcome: Progressing   Problem: Respiratory: Goal: Ability to maintain a clear airway will improve Outcome: Adequate for Discharge Goal: Levels of oxygenation will improve Outcome: Adequate for Discharge Goal: Ability to maintain adequate ventilation will improve Outcome: Adequate for Discharge   "

## 2024-07-18 NOTE — Progress Notes (Addendum)
 " Progress Note   Patient: Pamela [name] FMW:[id number] DOB: 15-Mar-1951 DOA: 07/16/2024     2 DOS: the patient was seen and examined on 07/18/2024   Brief hospital course: Rasheda Ledger is a 73 y.o. female with medical history significant for COPD/asthma, chronic respiratory failure with hypoxia on 3 LNC, chronic HFpEF, RUL adenocarcinoma s/p SBRT, sarcoidosis, HTN,  HLD, gout, chronic leg edema, CKD stage IIIB, morbid obesity, and OSA/OHS on BiPAP who presented to the ED for evaluation of shortness of breath and cough.  Patient reports that on Christmas, she started having increased shortness of breath, wheezing and productive cough of clear sputum. Over the last few days, she has attempted to manage her symptoms with her home inhalers and nebulizer treatments however the symptoms have continued to progress. She denies any chest pain, fevers, chills, nausea, vomiting, abdominal pain, dysuria or dizziness.   ED Course: Initial vitals show patient afebrile, RR 17-27, HR 70-80s, SBP 160s-180s, SpO2 97% on 3 L [place]. Initial labs overall unremarkable with no white count, normal renal function, flat troponin, proBNP 665 (normal for age), negative flu/RSV/COVID test. EKG shows sinus rhythm with incomplete RBBB and LAFB. CXR shows central peribronchial wall thickening bilaterally with streaky perihilar opacities. Pt received IV Solu-Medrol , multiple DuoNebs, IV Rocephin  and IV azithromycin .  Patient's O2 dropped to 83% with ambulation.  TRH was consulted for admission.   Further hospital course and management as outlined below.  12/29--30 - pt feeling better, on her baseline 3 L/min o2 this AM.  Continues to have diffuse wheezing on exam. Remains on IV steroids.   Assessment and Plan:   # COPD/asthma exacerbation # Chronic hypoxic respiratory failure Presented with progressive shortness of breath, wheezing and cough Pt with expiratory wheezes on lung auscultation, remains on baseline 3 L [place] CXR with  no evidence of PNA; Neg covid, RSV and flu test S/p IV solumedrol, multiple DuoNebs in the ED 12/30 - ongoing diffuse wheezing, but now with stable O2 requirement at baseline  - Continue IV Solu-Medrol  80 mg daily  - transition to Prednisone  once more improvement in wheezing - DuoNebs QID & PRN albuterol  nebs - Continue PO Zithromax   - PRN Robitussin - Continue home Pulmicort , Brovana  and Singulair  - IF, flutter valve   Chronic HFpEF Chronic lower extremity edema - Last TTE in 2024 showed EF 60-65%, no evidence of acute exacerbation - Continue Lasix  - Monitor volume status   CKD 3B - Creatinine of 1.10 stable compared to baseline of 1.1-1.4 - Continue home Calcitrol - Trend renal function, and avoid nephrotoxic agents   HLD - Continue atorvastatin  and Zetia   Gout - Continue allopurinol    GERD - Continue Protonix  and famotidine    OSA/OHS - Continue BiPAP at night / during sleep   Morbid obesity Body mass index is 50.64 kg/m. Complicates overall care and prognosis.  Recommend lifestyle modifications including physical activity and diet for weight loss and overall long-term health.       Subjective: Pt seen up in recliner this AM.  She reports productive cough and ongoing wheezing.  Otherwise feeling less short of breath.  No fever/chills or other acute complaints.   Physical Exam: Vitals:   07/18/24 0910 07/18/24 0920 07/18/24 1030 07/18/24 1430  BP:   (!) 152/79 (!) 148/88  Pulse:   72 86  Resp:   20 20  Temp:  98 F (36.7 C) 98.3 F (36.8 C) 98 F (36.7 C)  TempSrc:   Oral Oral  SpO2: 100%  100% 92%  Weight:      Height:       General exam: awake, alert, no acute distress, obese HEENT: moist mucus membranes, hearing grossly normal  Respiratory system: Still with diffuse expiratory wheezes in all lung fields, improved aeration, normal respiratory effort at rest, on 3 L/min [place] O2. Cardiovascular system: normal S1/S2, RRR, stable bilateral lower extremity  edema noted.   Gastrointestinal system: soft, NT, ND Central nervous system: A&O x3. no gross focal neurologic deficits, normal speech Extremities: Significant bilateral lower extremity edema stable  Skin: dry, intact, normal temperature Psychiatry: normal mood, congruent affect, judgement and insight appear normal  Data Reviewed:  Notable labs-  Bicarb 34 BUN 18 >> 24 Cr 1.1 >> 1.08 >> 1.26 up slightly but baseline Hbg 11 >> 11 >> 9.7    Family Communication: None present at bedside   Disposition: Status is: Inpatient Remains inpatient appropriate because: Requires further clinical improvement prior to safe discharge, remains with diffuse wheezing on exam and on IV steroids. Expect needs at least 1-2 more days.     Planned Discharge Destination: Home    Time spent: 45 minutes  Author: Burnard DELENA Cunning, DO 07/18/2024 5:43 PM  For on call review [email or web address].  "

## 2024-07-18 NOTE — Progress Notes (Signed)
" °   07/18/24 2324  BiPAP/CPAP/SIPAP  BiPAP/CPAP/SIPAP Pt Type Adult  BiPAP/CPAP/SIPAP Resmed  Mask Type Full face mask  Dentures removed? Not applicable  Mask Size Medium  Respiratory Rate 22 breaths/min  IPAP 16 cmH20  EPAP 5 cmH2O  Flow Rate 3 lpm (bled into circuit)  Patient Home Machine No  Patient Home Mask No  Patient Home Tubing No  Auto Titrate No  Device Plugged into RED Power Outlet Yes  BiPAP/CPAP /SiPAP Vitals  Resp (!) 24  MEWS Score/Color  MEWS Score 1  MEWS Score Color Green    "

## 2024-07-18 NOTE — Progress Notes (Signed)
" °   07/18/24 0115  BiPAP/CPAP/SIPAP  BiPAP/CPAP/SIPAP Pt Type Adult  BiPAP/CPAP/SIPAP Resmed  Reason BIPAP/CPAP not in use Non-compliant (Pt wants to sleep without Bipap tonight. RT advised to call if she changes her mind)  BiPAP/CPAP /SiPAP Vitals  Pulse Rate 78  Resp (!) 23  BP 124/72  SpO2 100 %  MEWS Score/Color  MEWS Score 1  MEWS Score Color Green    "

## 2024-07-19 DIAGNOSIS — J9621 Acute and chronic respiratory failure with hypoxia: Secondary | ICD-10-CM | POA: Diagnosis not present

## 2024-07-19 DIAGNOSIS — J189 Pneumonia, unspecified organism: Secondary | ICD-10-CM

## 2024-07-19 DIAGNOSIS — G4733 Obstructive sleep apnea (adult) (pediatric): Secondary | ICD-10-CM | POA: Diagnosis not present

## 2024-07-19 DIAGNOSIS — N183 Chronic kidney disease, stage 3 unspecified: Secondary | ICD-10-CM | POA: Insufficient documentation

## 2024-07-19 DIAGNOSIS — N1832 Chronic kidney disease, stage 3b: Secondary | ICD-10-CM | POA: Diagnosis not present

## 2024-07-19 DIAGNOSIS — J441 Chronic obstructive pulmonary disease with (acute) exacerbation: Secondary | ICD-10-CM | POA: Diagnosis not present

## 2024-07-19 DIAGNOSIS — Z6841 Body Mass Index (BMI) 40.0 and over, adult: Secondary | ICD-10-CM | POA: Diagnosis not present

## 2024-07-19 LAB — BASIC METABOLIC PANEL WITH GFR
Anion gap: 6 (ref 5–15)
BUN: 31 mg/dL — ABNORMAL HIGH (ref 8–23)
CO2: 34 mmol/L — ABNORMAL HIGH (ref 22–32)
Calcium: 8.7 mg/dL — ABNORMAL LOW (ref 8.9–10.3)
Chloride: 101 mmol/L (ref 98–111)
Creatinine, Ser: 1.31 mg/dL — ABNORMAL HIGH (ref 0.44–1.00)
GFR, Estimated: 43 mL/min — ABNORMAL LOW
Glucose, Bld: 103 mg/dL — ABNORMAL HIGH (ref 70–99)
Potassium: 5.1 mmol/L (ref 3.5–5.1)
Sodium: 142 mmol/L (ref 135–145)

## 2024-07-19 LAB — CBC
HCT: 33 % — ABNORMAL LOW (ref 36.0–46.0)
Hemoglobin: 9.7 g/dL — ABNORMAL LOW (ref 12.0–15.0)
MCH: 21.9 pg — ABNORMAL LOW (ref 26.0–34.0)
MCHC: 29.4 g/dL — ABNORMAL LOW (ref 30.0–36.0)
MCV: 74.5 fL — ABNORMAL LOW (ref 80.0–100.0)
Platelets: 159 K/uL (ref 150–400)
RBC: 4.43 MIL/uL (ref 3.87–5.11)
RDW: 15.4 % (ref 11.5–15.5)
WBC: 6 K/uL (ref 4.0–10.5)
nRBC: 0 % (ref 0.0–0.2)

## 2024-07-19 MED ORDER — BENZONATATE 100 MG PO CAPS
200.0000 mg | ORAL_CAPSULE | Freq: Three times a day (TID) | ORAL | Status: DC
  Administered 2024-07-19 – 2024-07-24 (×15): 200 mg via ORAL
  Filled 2024-07-19 (×15): qty 2

## 2024-07-19 MED ORDER — AZITHROMYCIN 500 MG PO TABS
500.0000 mg | ORAL_TABLET | Freq: Every day | ORAL | Status: AC
  Administered 2024-07-20 – 2024-07-21 (×2): 500 mg via ORAL
  Filled 2024-07-19 (×3): qty 1

## 2024-07-19 MED ORDER — SODIUM CHLORIDE 0.9 % IV SOLN
1.0000 g | INTRAVENOUS | Status: DC
  Administered 2024-07-19 – 2024-07-23 (×5): 1 g via INTRAVENOUS
  Filled 2024-07-19 (×5): qty 10

## 2024-07-19 NOTE — Evaluation (Signed)
 Physical Therapy Evaluation Patient Details Name: Pamela [name] MRN: [id number] DOB: 07-20-1951 Today's Date: 07/19/2024  History of Present Illness  Pt is a 73 y.o. female who presented 07/16/24 due to SOB and cough. Pt admitted for COPD/asthma exacerbation. PMH: COPD/asthma, chronic respiratory failure with hypoxia on 3 LNC, chronic HFpEF, RUL adenocarcinoma s/p SBRT, sarcoidosis, HTN,  HLD, gout, chronic leg edema, CKD stage IIIB, morbid obesity, and OSA/OHS on BiPAP  Clinical Impression  On eval, pt was CGA for mobility. She ambulated ~12 feet x 2 with use of a bari rollator. O2 85% on 3L with short distance ambulation + wheezing + dyspnea; with seated rest break, time, and pursed lip breathing sats recovered to 91% on 3L. Will plan to follow and progress activity as tolerated. Will recommend HHPT       If plan is discharge home, recommend the following: A little help with walking and/or transfers;A little help with bathing/dressing/bathroom;Assistance with cooking/housework;Assist for transportation;Help with stairs or ramp for entrance   Can travel by private vehicle        Equipment Recommendations None recommended by PT  Recommendations for Other Services       Functional Status Assessment Patient has had a recent decline in their functional status and demonstrates the ability to make significant improvements in function in a reasonable and predictable amount of time.     Precautions / Restrictions Precautions Precautions: Fall Precaution/Restrictions Comments: watch o2 Restrictions Weight Bearing Restrictions Per Provider Order: No      Mobility  Bed Mobility               General bed mobility comments: oob in recliner    Transfers Overall transfer level: Needs assistance Equipment used: Rolling walker (2 wheels) Transfers: Sit to/from Stand Sit to Stand: Contact guard assist, Min assist           General transfer comment: required elvated surface and  increase in time but reported surfaces higher at home. Min A from low recliner but pt was able to rise from bathroom toilet with use of grabbar without assistance    Ambulation/Gait Ambulation/Gait assistance: Contact guard assist Gait Distance (Feet): 12 Feet (x2) Assistive device: Rollator (4 wheels) Gait Pattern/deviations: Step-through pattern, Decreased stride length       General Gait Details: O2 85% on 3L with ambulation to/from bathroom with rollator. CGA for safety.  Stairs            Wheelchair Mobility     Tilt Bed    Modified Rankin (Stroke Patients Only)       Balance Overall balance assessment: Needs assistance         Standing balance support: Bilateral upper extremity supported, During functional activity, Reliant on assistive device for balance Standing balance-Leahy Scale: Fair                               Pertinent Vitals/Pain Pain Assessment Pain Assessment: No/denies pain    Home Living Family/patient expects to be discharged to:: Private residence Living Arrangements: Children Available Help at Discharge: Family Type of Home: House Home Access: Stairs to enter   Secretary/administrator of Steps: 1 Alternate Level Stairs-Number of Steps: flight Home Layout: Two level;Able to live on main level with bedroom/bathroom Home Equipment: Rolling Walker (2 wheels);Rollator (4 wheels);Cane - single point;Shower seat Additional Comments: pt lives with family and they can assist if needed but she normally completes activities indep  Prior Function Prior Level of Function : Independent/Modified Independent             Mobility Comments: cane vs 4WW ADLs Comments: mod I     Extremity/Trunk Assessment   Upper Extremity Assessment Upper Extremity Assessment: Defer to OT evaluation    Lower Extremity Assessment Lower Extremity Assessment: Generalized weakness       Communication   Communication Communication: No  apparent difficulties    Cognition Arousal: Alert Behavior During Therapy: WFL for tasks assessed/performed                             Following commands: Intact       Cueing Cueing Techniques: Verbal cues     General Comments      Exercises     Assessment/Plan    PT Assessment Patient needs continued PT services  PT Problem List Decreased strength;Decreased range of motion;Decreased activity tolerance;Decreased balance;Decreased mobility;Decreased knowledge of use of DME       PT Treatment Interventions DME instruction;Gait training;Functional mobility training;Therapeutic activities;Therapeutic exercise;Patient/family education;Balance training    PT Goals (Current goals can be found in the Care Plan section)  Acute Rehab PT Goals Patient Stated Goal: get better PT Goal Formulation: With patient Time For Goal Achievement: 08/02/24 Potential to Achieve Goals: Good    Frequency Min 3X/week     Co-evaluation               AM-PAC PT 6 Clicks Mobility  Outcome Measure Help needed turning from your back to your side while in a flat bed without using bedrails?: A Little Help needed moving from lying on your back to sitting on the side of a flat bed without using bedrails?: A Little Help needed moving to and from a bed to a chair (including a wheelchair)?: A Little Help needed standing up from a chair using your arms (e.g., wheelchair or bedside chair)?: A Little Help needed to walk in hospital room?: A Little Help needed climbing 3-5 steps with a railing? : A Little 6 Click Score: 18    End of Session Equipment Utilized During Treatment: Gait belt Activity Tolerance: Patient tolerated treatment well Patient left: in chair;with call bell/phone within reach   PT Visit Diagnosis: Muscle weakness (generalized) (M62.81);Difficulty in walking, not elsewhere classified (R26.2)    Time: 8864-8844 PT Time Calculation (min) (ACUTE ONLY): 20  min   Charges:   PT Evaluation $PT Eval Low Complexity: 1 Low   PT General Charges $$ ACUTE PT VISIT: 1 Visit            Dannial SQUIBB, PT Acute Rehabilitation  Office: [phone]

## 2024-07-19 NOTE — Progress Notes (Signed)
" °  Progress Note   Patient: Pamela [name] FMW:[id number] DOB: 09-20-1950 DOA: 07/16/2024     3 DOS: the patient was seen and examined on 07/19/2024   Brief hospital course: 73 year old woman PMH including COPD, chronic hypoxic respiratory failure on 3 L nasal cannula, history of lung cancer, sarcoidosis, morbid obesity, OSA/OSH on BiPAP, presenting with shortness of breath and cough.  Admitted for COPD exacerbation.  Consultants None   Procedures/Events 12/28 admit for COPD exacerabation  Assessment and Plan: COPD/asthma exacerbation Acute on chronic hypoxic respiratory failure baseline 3 L Community-acquired pneumonia COVID, RSV, flu negative.  Procalcitonin negative.  Chest x-ray on my review shows perihilar infiltrate, lateral view shows anterior infiltrate.  EDP documented O2 sat dropped to 83% with ambulation. Continue steroids, bronchodilators, oral Zithromax .  Add ceftriaxone .  OSA/OHS Continue BiPAP at night  RUL adenocarcinoma s/p SBRT Sarcoidosis   Chronic diastolic CHF Chronic lower extremity edema Last echocardiogram LVEF 60 to 65% Appears stable.  CKD stage IIIb Creatinine variable 1.1-1.5 Appears to be stable here  Microcytic anemia Hemoglobin stable.  Microcytosis longstanding Follow-up as an outpatient  Gout Continue allopurinol   Trivial troponin elevation Clinically insignificant  Class 3 obesity  Body mass index is 50.64 kg/m.   Creatinine variable, probably at baseline, 1.31 today, potassium within normal limits Hemoglobin stable 9.7, baseline around 7    Subjective:  Breathing is about the same compared to yesterday, not back to baseline  Physical Exam: Vitals:   07/18/24 2324 07/19/24 0311 07/19/24 0504 07/19/24 0742  BP:  (!) 143/91 (!) 143/60   Pulse:  100 84   Resp: (!) 24 18 18    Temp:  98.6 F (37 C) 98.3 F (36.8 C)   TempSrc:  Oral Oral   SpO2:  95% 99% 100%  Weight:      Height:       Physical Exam Vitals reviewed.   Constitutional:      General: She is not in acute distress.    Appearance: She is ill-appearing. She is not toxic-appearing.  Cardiovascular:     Rate and Rhythm: Normal rate and regular rhythm.     Heart sounds: No murmur heard. Pulmonary:     Effort: No respiratory distress.     Breath sounds: Wheezing and rhonchi present. No rales.     Comments: Diffuse wheezing, mild increased respiratory effort Neurological:     Mental Status: She is alert.  Psychiatric:        Mood and Affect: Mood normal.        Behavior: Behavior normal.    Data Reviewed: Creatinine variable, probably at baseline, 1.31 today, potassium within normal limits Hemoglobin stable 9.7, baseline around 7  Family Communication: none present or requested  Disposition: Status is: Inpatient Remains inpatient appropriate because: COPD exacerbation, pneumonia     Time spent: 35 minutes  Author: Toribio Door, MD 07/19/2024 10:31 AM  For on call review [email or web address].    "

## 2024-07-19 NOTE — TOC Initial Note (Signed)
 Transition of Care [facility]) - Initial/Assessment Note    Patient Details  Name: Pamela [name] MRN: [id number] Date of Birth: [birth year]  Transition of Care [facility]) CM/SW Contact:    Bascom Service, RN Phone Number: 07/19/2024, 2:51 PM  Clinical Narrative:Spoke to patient about d/c plans-Home.active w/[facility] 02-has travel tank;Used [facility] in past-[facility] rep [facility] accepted for HHPT/OT. Has own transport home. Provided w/resources for financial asst,meals-Dept Social Services on AVS.                   Expected Discharge Plan: Home w Home Health Services Barriers to Discharge: Continued Medical Work up   Patient Goals and CMS Choice Patient states their goals for this hospitalization and ongoing recovery are:: Home CMS Medicare.gov Compare Post Acute Care list provided to:: Patient Choice offered to / list presented to : Patient Friendly ownership interest in [facility]provided to:: Patient    Expected Discharge Plan and Services   Discharge Planning Services: CM Consult Post Acute Care Choice: Home Health Living arrangements for the past 2 months: Single Family Home                           HH Arranged: PT, OT HH Agency: [facility] Date [facility] Agency Contacted: 07/19/24 Time HH Agency Contacted: 1450 Representative spoke with at [facility] Agency: [facility]  Prior Living Arrangements/Services Living arrangements for the past 2 months: Single Family Home Lives with:: Adult Children   Do you feel safe going back to the place where you live?: Yes          Current home services: DME (Active [facility]-home 02-has travel tank)    Activities of Daily Living   ADL Screening (condition at time of admission) Independently performs ADLs?: No Does the patient have a NEW difficulty with bathing/dressing/toileting/self-feeding that is expected to last >3 days?: Yes (Initiates electronic notice to provider for possible OT consult) Does the patient have  a NEW difficulty with getting in/out of bed, walking, or climbing stairs that is expected to last >3 days?: Yes (Initiates electronic notice to provider for possible PT consult) Does the patient have a NEW difficulty with communication that is expected to last >3 days?: No Is the patient deaf or have difficulty hearing?: No Does the patient have difficulty seeing, even when wearing glasses/contacts?: No Does the patient have difficulty concentrating, remembering, or making decisions?: No  Permission Sought/Granted Permission sought to share information with : Case Manager Permission granted to share information with : Yes, Verbal Permission Granted              Emotional Assessment              Admission diagnosis:  Gouty arthritis [M10.9] COPD exacerbation (HCC) [J44.1] COPD with acute exacerbation (HCC) [J44.1] Diastolic congestive heart failure, unspecified HF chronicity (HCC) [I50.30] Acute hypoxic respiratory failure (HCC) [J96.01] Patient Active Problem List   Diagnosis Date Noted   CAP (community acquired pneumonia) 07/19/2024   Chronic kidney disease (CKD), stage III (moderate) (HCC) 07/19/2024   Obesity hypoventilation syndrome (HCC) 07/17/2024   Mixed hyperlipidemia 07/17/2024   Exacerbation of asthma 07/17/2024   Gouty arthritis 07/17/2024   Hyperglycemia 05/08/2024   Bilateral leg cramps 05/08/2024   Secondary hyperparathyroidism of renal origin 05/08/2024   Pneumonia due to COVID-19 virus 03/02/2024   Influenza vaccine needed 07/28/2023   Bilateral leg edema 07/28/2023   Dyslipidemia 07/28/2023   Urinary incontinence 07/28/2023   Macromastia 07/28/2023  Chronic kidney disease 02/25/2023   Severe asthma with exacerbation 02/25/2023   Moderate persistent asthma with acute exacerbation 02/24/2023   Tracheomalacia 02/24/2023   Acute on chronic respiratory failure with hypoxia (HCC) 02/24/2023   Acute on chronic respiratory acidosis (HCC) 02/23/2023   Vocal  cord dysfunction 01/29/2023   Tracheomalacia, 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   Severe persistent chronic asthma without complication (HCC) 09/08/2022   Sickle-cell trait 09/08/2022   Acute respiratory failure with hypercapnia (HCC) 08/17/2022   COPD exacerbation (HCC) 04/04/2022   Vitamin D  deficiency 02/02/2022   Stage 3b chronic kidney disease (HCC) 09/24/2020   Malignant neoplasm of lung (HCC) 09/24/2020   Aortic atherosclerosis 09/24/2020   PFO (patent foramen ovale) 09/24/2020   History of stroke 09/24/2020   Acute respiratory failure with hypoxia (HCC) 12/17/2019   COPD with acute exacerbation (HCC) 12/17/2019   COVID-19 virus infection 01/02/2019   Right knee pain 09/15/2018   Closed head injury with concussion 09/06/2018   Hypokalemia 09/06/2018   Hypomagnesemia 09/06/2018   Asthma, chronic obstructive, with acute exacerbation (HCC) 08/02/2018   Plantar fasciitis 04/14/2017   Microcytosis 11/28/2015   Solitary kidney 07/18/2015   Onychomycosis 01/09/2015   Ingrown nail 01/09/2015   Pain in lower limb 01/09/2015   Chronic respiratory failure with hypoxia and hypercapnia (HCC) 07/19/2014   Acute on chronic respiratory failure with hypoxia and hypercapnia (HCC) 06/24/2014   Anemia, iron  deficiency 05/27/2014   Acute gouty arthritis 05/25/2014   OSA treated with BiPAP 05/25/2014   Joint pain 05/24/2014   Chronic heart failure with preserved ejection fraction (HFpEF) (HCC)    Sarcoidosis    Metabolic syndrome 05/21/2014   Asthma, chronic 04/27/2014   Anemia of chronic disease 04/19/2014   Morbid obesity with BMI of 50.0-59.9, adult (HCC) 04/18/2014   Immunization due 04/18/2014   Need for prophylactic vaccination and inoculation against influenza 04/18/2014   Prediabetes 04/13/2014   Essential hypertension 04/13/2014   Lower extremity edema 04/13/2014   History of lung cancer 04/13/2014   PCP:  Paseda, [name], FNP Pharmacy:   [facility] - [place], [place] - [address] [place] [address] Phone: [phone] Fax: [phone]  [facility] - [place], [place] - [address] [place] [address] Phone: [phone] Fax: [phone]  [facility] [address] [place]), [place] - [address] [place]) [place] [address] Phone: [phone] Fax: [phone]     Social Drivers of Health (SDOH) Social History: SDOH Screenings   Food Insecurity: No Food Insecurity (07/18/2024)  Housing: Low Risk (07/18/2024)  Transportation Needs: No Transportation Needs (07/18/2024)  Utilities: Not At Risk (07/18/2024)  Alcohol Screen: Low Risk (02/24/2024)  Depression (PHQ2-9): Low Risk (05/08/2024)  Financial Resource Strain: Medium Risk (02/24/2024)  Physical Activity: Insufficiently Active (02/24/2024)  Social Connections: Moderately Integrated (07/18/2024)  Stress: No Stress Concern Present (02/24/2024)  Tobacco Use: Low Risk (07/16/2024)  Health Literacy: Adequate Health Literacy (07/22/2023)   SDOH Interventions:     Readmission Risk Interventions    02/26/2023    6:27 PM 04/06/2022   12:12 PM  Readmission Risk Prevention Plan  Transportation Screening Complete Complete  PCP or Specialist Appt within 5-7 Days  Complete  Home Care Screening  Complete  Medication Review (RN CM)  Complete  Medication Review (RN Care Manager) Complete   PCP or Specialist appointment within 3-5 days of discharge Complete   HRI or Home Care Consult Complete  SW Recovery Care/Counseling Consult Complete   Palliative Care Screening Not Applicable   Skilled Nursing Facility Not Applicable

## 2024-07-19 NOTE — Care Management Important Message (Signed)
 Important Message  Patient Details IM Letter given. Name: Pamela [name] MRN: [id number] Date of Birth: 10/15/1950   Important Message Given:  Yes - Medicare IM     Pamela [name] 07/19/2024, 12:45 PM

## 2024-07-19 NOTE — Evaluation (Signed)
 Occupational Therapy Evaluation Patient Details Name: Pamela [name] MRN: [id number] DOB: Dec 11, 1950 Today's Date: 07/19/2024   History of Present Illness   Pt is a 73 y.o. female who presented 07/16/24 due to SOB and cough. Pt admitted for COPD/asthma exacerbation. PMH: COPD/asthma, chronic respiratory failure with hypoxia on 3 LNC, chronic HFpEF, RUL adenocarcinoma s/p SBRT, sarcoidosis, HTN,  HLD, gout, chronic leg edema, CKD stage IIIB, morbid obesity, and OSA/OHS on BiPAP     Clinical Impressions Pt reported at PLOF they live with their family with 1 STE in a 2 story home but lives on the main level with a walk in shower. She reported she uses SPC vs 4ww depending on the activity but was indep to mod I depending on ADLS.  At this time pt present on 3L via [employer] and was able to complete ADLS sitting to standing with CGA and RW but needed seated rest and cues on breathing as o2 did destat to 82-85% but then was able to recover to 92%. Pt at this time was unclear if she wanted to complete [facility] care as would like to see how they they improve.      If plan is discharge home, recommend the following:   Assistance with cooking/housework;Assist for transportation;Help with stairs or ramp for entrance;A little help with bathing/dressing/bathroom     Functional Status Assessment   Patient has had a recent decline in their functional status and demonstrates the ability to make significant improvements in function in a reasonable and predictable amount of time.     Equipment Recommendations   None recommended by OT     Recommendations for Other Services         Precautions/Restrictions   Precautions Precautions: Fall Recall of Precautions/Restrictions: Intact Precaution/Restrictions Comments: watch o2 Restrictions Weight Bearing Restrictions Per Provider Order: No     Mobility Bed Mobility Overal bed mobility: Modified Independent             General bed mobility  comments: HOB slightly elevated but also reported they use 3-4 pillow at home    Transfers Overall transfer level: Needs assistance Equipment used: Rolling walker (2 wheels) Transfers: Sit to/from Stand Sit to Stand: Contact guard assist, Supervision           General transfer comment: required elvated surface and increase in time but reported surfaces higher at home      Balance Overall balance assessment: Needs assistance Sitting-balance support: Feet supported Sitting balance-Leahy Scale: Good     Standing balance support: No upper extremity supported, Bilateral upper extremity supported Standing balance-Leahy Scale: Fair Standing balance comment: BUE with ambulation but able to complete peri care without UE support in standing                           ADL either performed or assessed with clinical judgement   ADL Overall ADL's : Needs assistance/impaired Eating/Feeding: Independent;Sitting   Grooming: Wash/dry hands;Wash/dry face;Supervision/safety;Contact guard assist;Standing   Upper Body Bathing: Set up;Sitting   Lower Body Bathing: Minimal assistance;Sit to/from stand;Sitting/lateral leans   Upper Body Dressing : Set up;Sitting   Lower Body Dressing: Minimal assistance;Sit to/from stand;Sitting/lateral leans   Toilet Transfer: Contact guard assist;Supervision/safety;Rolling walker (2 wheels)   Toileting- Clothing Manipulation and Hygiene: Contact guard assist;Sit to/from stand       Functional mobility during ADLs: Contact guard assist;Supervision/safety;Rolling walker (2 wheels);Cueing for safety;Cueing for sequencing       Vision Baseline Vision/History: 1  Wears glasses Ability to See in Adequate Light: 0 Adequate Patient Visual Report: No change from baseline Vision Assessment?: Wears glasses for reading;Wears glasses for driving     Perception Perception: Within Functional Limits       Praxis Praxis: WFL       Pertinent  Vitals/Pain Pain Assessment Pain Assessment: No/denies pain     Extremity/Trunk Assessment Upper Extremity Assessment Upper Extremity Assessment: RUE deficits/detail;LUE deficits/detail;Left hand dominant RUE Deficits / Details: Pt reported R shoulder has been bothering them with arthritis more than L side. Pt has limited AROM to about 80 deg shoulder flexion RUE Coordination: decreased gross motor LUE Deficits / Details: PtWFL for ADLs but noted decrease in AROM to about 90 deg LUE Coordination: decreased gross motor   Lower Extremity Assessment Lower Extremity Assessment: Defer to PT evaluation   Cervical / Trunk Assessment Cervical / Trunk Assessment: Kyphotic (pt noted to often have neck in flexed position but pt reported often in this position due to R shoulder pain)   Communication Communication Communication: No apparent difficulties   Cognition Arousal: Alert Behavior During Therapy: WFL for tasks assessed/performed Cognition: No apparent impairments                               Following commands: Intact       Cueing  General Comments   Cueing Techniques: Verbal cues      Exercises     Shoulder Instructions      Home Living Family/patient expects to be discharged to:: Private residence Living Arrangements: Children Available Help at Discharge: Family Type of Home: House Home Access: Stairs to enter Secretary/administrator of Steps: 1   Home Layout: Two level;Able to live on main level with bedroom/bathroom Alternate Level Stairs-Number of Steps: flight Alternate Level Stairs-Rails: Right;Left Bathroom Shower/Tub: Producer, Television/film/video: Handicapped height     Home Equipment: Agricultural Consultant (2 wheels);Rollator (4 wheels);Cane - single point;Shower seat   Additional Comments: pt lives with family and they can assist if needed but she normally completes activities indep      Prior Functioning/Environment Prior Level of  Function : Independent/Modified Independent             Mobility Comments: cane vs 4WW ADLs Comments: mod I    OT Problem List: Decreased strength;Decreased activity tolerance;Impaired balance (sitting and/or standing);Decreased safety awareness;Decreased knowledge of use of DME or AE;Pain   OT Treatment/Interventions: Self-care/ADL training;Therapeutic exercise;DME and/or AE instruction;Therapeutic activities;Patient/family education;Balance training      OT Goals(Current goals can be found in the care plan section)   Acute Rehab OT Goals Patient Stated Goal: to go home OT Goal Formulation: With patient Time For Goal Achievement: 08/02/24 Potential to Achieve Goals: Good   OT Frequency:  Min 2X/week    Co-evaluation              AM-PAC OT 6 Clicks Daily Activity     Outcome Measure Help from another person eating meals?: None Help from another person taking care of personal grooming?: None Help from another person toileting, which includes using toliet, bedpan, or urinal?: A Little Help from another person bathing (including washing, rinsing, drying)?: A Little Help from another person to put on and taking off regular upper body clothing?: None Help from another person to put on and taking off regular lower body clothing?: A Little 6 Click Score: 21   End of Session Equipment Utilized  During Treatment: Gait belt;Rolling walker (2 wheels) Nurse Communication: Mobility status  Activity Tolerance: Patient tolerated treatment well Patient left: in chair;with call bell/phone within reach  OT Visit Diagnosis: Other abnormalities of gait and mobility (R26.89);Muscle weakness (generalized) (M62.81);Pain Pain - Right/Left: Right Pain - part of body: Shoulder                Time: 9061-8970 OT Time Calculation (min): 51 min Charges:  OT General Charges $OT Visit: 1 Visit OT Evaluation $OT Eval Low Complexity: 1 Low OT Treatments $Self Care/Home Management : 23-37  mins  Warrick POUR OTR/L  Acute Rehab Services  [phone] office number   Warrick Berber 07/19/2024, 10:40 AM

## 2024-07-19 NOTE — Hospital Course (Addendum)
 73 year old woman PMH including COPD, chronic hypoxic respiratory failure on 3 L nasal cannula, history of lung cancer, sarcoidosis, morbid obesity, OSA/OSH on BiPAP, presenting with shortness of breath and cough.  Admitted for COPD exacerbation.  Consultants None   Procedures/Events 12/28 admit for COPD exacerabation

## 2024-07-20 DIAGNOSIS — J189 Pneumonia, unspecified organism: Secondary | ICD-10-CM | POA: Diagnosis not present

## 2024-07-20 DIAGNOSIS — J9621 Acute and chronic respiratory failure with hypoxia: Secondary | ICD-10-CM | POA: Diagnosis not present

## 2024-07-20 DIAGNOSIS — J441 Chronic obstructive pulmonary disease with (acute) exacerbation: Secondary | ICD-10-CM | POA: Diagnosis not present

## 2024-07-20 NOTE — Progress Notes (Signed)
" °   07/20/24 0152  BiPAP/CPAP/SIPAP  BiPAP/CPAP/SIPAP Pt Type Adult  BiPAP/CPAP/SIPAP Resmed  Mask Type Full face mask  Dentures removed? Not applicable  Mask Size Medium  Respiratory Rate 16 breaths/min  IPAP 16 cmH20  EPAP 5 cmH2O  Flow Rate 3 lpm (bled into circuit)  Patient Home Machine No  Patient Home Mask No  Patient Home Tubing No  Auto Titrate No  CPAP/SIPAP surface wiped down Yes  Device Plugged into RED Power Outlet Yes    "

## 2024-07-20 NOTE — Progress Notes (Signed)
 Physical Therapy Treatment Patient Details Name: Pamela [name] MRN: [id number] DOB: 1951/05/23 Today's Date: 07/20/2024   History of Present Illness Pt is a 74 y.o. female who presented 07/16/24 due to SOB and cough. Pt admitted for COPD/asthma exacerbation. PMH: COPD/asthma, chronic respiratory failure with hypoxia on 3 LNC, chronic HFpEF, RUL adenocarcinoma s/p SBRT, sarcoidosis, HTN,  HLD, gout, chronic leg edema, CKD stage IIIB, morbid obesity, and OSA/OHS on BiPAP    PT Comments  Pt resting in bed with son and grandchildren at bed side. Pt noted to have several coughing spells throughout session, not productive with audible wheezing after coughing. RN notified, cough meds requested if available. Pt required increase to 4L for mobility during session, sats at 90% on 4L. Education to pt on O2 sat monitoring at home and ideal range given that she has COPD. Pt will continued to benefit from skilled therapy while in the hospital and follow up with HHPT.    If plan is discharge home, recommend the following: A little help with walking and/or transfers;A little help with bathing/dressing/bathroom;Assistance with cooking/housework;Assist for transportation;Help with stairs or ramp for entrance   Can travel by private vehicle        Equipment Recommendations       Recommendations for Other Services       Precautions / Restrictions Precautions Precautions: Fall Recall of Precautions/Restrictions: Intact Precaution/Restrictions Comments: watch o2 Restrictions Weight Bearing Restrictions Per Provider Order: No     Mobility  Bed Mobility Overal bed mobility: Modified Independent             General bed mobility comments: increased time and effort to move to EOB with use of bedrails and HOB elevated    Transfers Overall transfer level: Needs assistance Equipment used: 1 person hand held assist Transfers: Sit to/from Stand, Bed to chair/wheelchair/BSC Sit to Stand: Min  assist Stand pivot transfers: Min assist         General transfer comment: MIN A to stand from EOB and pivot to recliner with BUE on PT's arms, pt reports feeling more tired today; noted to have increased coughing spells throughout session requiring increase to 4L O2    Ambulation/Gait Ambulation/Gait assistance: Min assist Gait Distance (Feet): 5 Feet     Gait velocity: decreased     General Gait Details: O2 85% on 3L when seated at EOB and mobility to chair, increased to 4L; MIN A for balance assist while taking steps to chair   Stairs             Wheelchair Mobility     Tilt Bed    Modified Rankin (Stroke Patients Only)       Balance Overall balance assessment: Needs assistance Sitting-balance support: Feet supported Sitting balance-Leahy Scale: Good     Standing balance support: Bilateral upper extremity supported, During functional activity, Reliant on assistive device for balance Standing balance-Leahy Scale: Fair                              Hotel Manager: No apparent difficulties  Cognition Arousal: Alert Behavior During Therapy: WFL for tasks assessed/performed                             Following commands: Intact      Cueing Cueing Techniques: Verbal cues  Exercises      General Comments  Pertinent Vitals/Pain Pain Assessment Pain Assessment: No/denies pain    Home Living                          Prior Function            PT Goals (current goals can now be found in the care plan section) Acute Rehab PT Goals Patient Stated Goal: get better PT Goal Formulation: With patient Time For Goal Achievement: 08/02/24 Potential to Achieve Goals: Good Progress towards PT goals: Progressing toward goals    Frequency    Min 3X/week      PT Plan      Co-evaluation              AM-PAC PT 6 Clicks Mobility   Outcome Measure  Help needed turning  from your back to your side while in a flat bed without using bedrails?: A Little Help needed moving from lying on your back to sitting on the side of a flat bed without using bedrails?: A Little Help needed moving to and from a bed to a chair (including a wheelchair)?: A Little Help needed standing up from a chair using your arms (e.g., wheelchair or bedside chair)?: A Little Help needed to walk in hospital room?: A Little Help needed climbing 3-5 steps with a railing? : A Lot 6 Click Score: 17    End of Session Equipment Utilized During Treatment: Gait belt;Oxygen  Activity Tolerance: Patient tolerated treatment well;Patient limited by fatigue Patient left: in chair;with call bell/phone within reach;with family/visitor present Nurse Communication: Mobility status;Other (comment) (increased O2 needs) PT Visit Diagnosis: Muscle weakness (generalized) (M62.81);Difficulty in walking, not elsewhere classified (R26.2)     Time: 8590-8569 PT Time Calculation (min) (ACUTE ONLY): 21 min  Charges:    $Therapeutic Activity: 8-22 mins                       Isaiah DEL. Zian Delair, PT, DPT   [employer] 07/20/2024, 3:30 PM

## 2024-07-20 NOTE — Progress Notes (Signed)
" °  Progress Note   Patient: Pamela [name] FMW:[id number] DOB: 18-Sep-1950 DOA: 07/16/2024     4 DOS: the patient was seen and examined on 07/20/2024   Brief hospital course: 74 year old woman PMH including COPD, chronic hypoxic respiratory failure on 3 L nasal cannula, history of lung cancer, sarcoidosis, morbid obesity, OSA/OSH on BiPAP, presenting with shortness of breath and cough.  Admitted for COPD exacerbation.  Consultants None   Procedures/Events 12/28 admit for COPD exacerabation   Assessment and Plan: COPD/asthma exacerbation Acute on chronic hypoxic respiratory failure baseline 3 L Community-acquired pneumonia COVID, RSV, flu negative.  Procalcitonin negative.  Chest x-ray on my review shows perihilar infiltrate, lateral view shows anterior infiltrate.  EDP documented O2 sat dropped to 83% with ambulation. Slowly improving.  Still not back to baseline.  Continue bronchodilators, steroids, antibiotics.   OSA/OHS Continue BiPAP at night   RUL adenocarcinoma s/p SBRT Sarcoidosis    Chronic diastolic CHF Chronic lower extremity edema Last echocardiogram LVEF 60 to 65% Appears stable.   CKD stage IIIb Creatinine variable 1.1-1.5 Appears to be stable here   Microcytic anemia Hemoglobin stable.  Microcytosis longstanding Follow-up as an outpatient   Gout Continue allopurinol    Trivial troponin elevation Clinically insignificant   Class 3 obesity  Body mass index is 50.64 kg/m.  Some improvement, hopefully next 2 to 3 days will be able to discharge home with home health.    Subjective:  Feels better today but still short of breath and not back to baseline  Physical Exam: Vitals:   07/19/24 1959 07/19/24 2001 07/20/24 0612 07/20/24 0752  BP: (!) 145/74  (!) 150/81   Pulse: 77  69   Resp: 19  20   Temp: 98.7 F (37.1 C)  97.7 F (36.5 C)   TempSrc: Oral  Oral   SpO2: 98% 98% 97% 96%  Weight:      Height:       Physical Exam Vitals reviewed.   Constitutional:      General: She is not in acute distress.    Appearance: She is not ill-appearing or toxic-appearing.  Cardiovascular:     Rate and Rhythm: Normal rate and regular rhythm.     Heart sounds: No murmur heard. Pulmonary:     Effort: No respiratory distress.     Breath sounds: No wheezing, rhonchi or rales.     Comments: Fair air movement but still short of breath Neurological:     Mental Status: She is alert.  Psychiatric:        Mood and Affect: Mood normal.        Behavior: Behavior normal.     Data Reviewed: No new data  Family Communication: Son, grandson, granddaughter at bedside updated  Disposition: Status is: Inpatient Remains inpatient appropriate because: COPD     Time spent: 20 minutes  Author: Toribio Door, MD 07/20/2024 1:16 PM  For on call review [email or web address].    "

## 2024-07-21 DIAGNOSIS — J441 Chronic obstructive pulmonary disease with (acute) exacerbation: Secondary | ICD-10-CM | POA: Diagnosis not present

## 2024-07-21 DIAGNOSIS — J9621 Acute and chronic respiratory failure with hypoxia: Secondary | ICD-10-CM | POA: Diagnosis not present

## 2024-07-21 DIAGNOSIS — J189 Pneumonia, unspecified organism: Secondary | ICD-10-CM | POA: Diagnosis not present

## 2024-07-21 DIAGNOSIS — N1832 Chronic kidney disease, stage 3b: Secondary | ICD-10-CM | POA: Diagnosis not present

## 2024-07-21 NOTE — Progress Notes (Signed)
" °  Progress Note   Patient: Pamela [name] FMW:[id number] DOB: [birth year] DOA: 07/16/2024     5 DOS: the patient was seen and examined on 07/21/2024   Brief hospital course: 74 year old woman PMH including COPD, chronic hypoxic respiratory failure on 3 L nasal cannula, history of lung cancer, sarcoidosis, morbid obesity, OSA/OSH on BiPAP, presenting with shortness of breath and cough.  Admitted for COPD exacerbation.  Consultants None   Procedures/Events 12/28 admit for COPD exacerabation   Assessment and Plan: COPD/asthma exacerbation Acute on chronic hypoxic respiratory failure baseline 3 L Community-acquired pneumonia COVID, RSV, flu negative.  Procalcitonin negative.  Chest x-ray on my review shows perihilar infiltrate, lateral view shows anterior infiltrate.  EDP documented O2 sat dropped to 83% with ambulation. Not back to baseline but appears little bit better today.  Continue bronchodilators, steroids, antibiotics.   OSA/OHS Continue BiPAP at night   RUL adenocarcinoma s/p SBRT Sarcoidosis    Chronic diastolic CHF Chronic lower extremity edema Last echocardiogram LVEF 60 to 65% Appears stable.   CKD stage IIIb Creatinine variable 1.1-1.5 Appears to be stable here   Microcytic anemia Hemoglobin stable.  Microcytosis longstanding Follow-up as an outpatient   Gout Continue allopurinol    Trivial troponin elevation Clinically insignificant   Class 3 obesity  Body mass index is 50.64 kg/m.      Subjective:  Very slow to improve, feels about 50%  Physical Exam: Vitals:   07/20/24 2023 07/21/24 0017 07/21/24 0456 07/21/24 0751  BP:   117/88   Pulse:  78 62   Resp:  20 17   Temp:   97.8 F (36.6 C)   TempSrc:      SpO2: 98% 97% 100% 93%  Weight:      Height:       Physical Exam Vitals reviewed.  Constitutional:      General: She is not in acute distress.    Appearance: She is not ill-appearing or toxic-appearing.  Cardiovascular:     Rate and  Rhythm: Normal rate and regular rhythm.     Heart sounds: No murmur heard. Pulmonary:     Effort: Pulmonary effort is normal. No respiratory distress.     Breath sounds: No wheezing, rhonchi or rales.  Neurological:     Mental Status: She is alert.  Psychiatric:        Mood and Affect: Mood normal.        Behavior: Behavior normal.     Data Reviewed: No new data  Family Communication: none  Disposition: Status is: Inpatient Remains inpatient appropriate because: COPD, pneumonia     Time spent: 20 minutes  Author: Toribio Door, MD 07/21/2024 8:10 AM  For on call review [email or web address]    "

## 2024-07-21 NOTE — Progress Notes (Signed)
" °   07/21/24 0017  BiPAP/CPAP/SIPAP  BiPAP/CPAP/SIPAP Pt Type Adult  BiPAP/CPAP/SIPAP Resmed  Mask Type Full face mask  Dentures removed? Not applicable  Mask Size Medium  IPAP 16 cmH20  EPAP 5 cmH2O  Flow Rate 4 lpm  Patient Home Machine No  Patient Home Mask No  Patient Home Tubing No  Auto Titrate No  CPAP/SIPAP surface wiped down Yes  Device Plugged into RED Power Outlet Yes  BiPAP/CPAP /SiPAP Vitals  Pulse Rate 78  Resp 20  SpO2 97 %  Bilateral Breath Sounds Expiratory wheezes;Inspiratory wheezes  MEWS Score/Color  MEWS Score 0  MEWS Score Color Green    "

## 2024-07-22 DIAGNOSIS — J441 Chronic obstructive pulmonary disease with (acute) exacerbation: Secondary | ICD-10-CM | POA: Diagnosis not present

## 2024-07-22 DIAGNOSIS — J189 Pneumonia, unspecified organism: Secondary | ICD-10-CM | POA: Diagnosis not present

## 2024-07-22 DIAGNOSIS — J9621 Acute and chronic respiratory failure with hypoxia: Secondary | ICD-10-CM | POA: Diagnosis not present

## 2024-07-22 MED ORDER — PREDNISONE 20 MG PO TABS
40.0000 mg | ORAL_TABLET | Freq: Every day | ORAL | Status: DC
  Administered 2024-07-23 – 2024-07-24 (×2): 40 mg via ORAL
  Filled 2024-07-22 (×2): qty 2

## 2024-07-22 NOTE — Plan of Care (Signed)
   Problem: Education: Goal: Knowledge of disease or condition will improve Outcome: Progressing Goal: Knowledge of the prescribed therapeutic regimen will improve Outcome: Progressing Goal: Individualized Educational Video(s) Outcome: Progressing   Problem: Activity: Goal: Ability to tolerate increased activity will improve Outcome: Progressing Goal: Will verbalize the importance of balancing activity with adequate rest periods Outcome: Progressing   Problem: Respiratory: Goal: Ability to maintain a clear airway will improve Outcome: Progressing Goal: Levels of oxygenation will improve Outcome: Progressing Goal: Ability to maintain adequate ventilation will improve Outcome: Progressing   Problem: Education: Goal: Knowledge of General Education information will improve Description: Including pain rating scale, medication(s)/side effects and non-pharmacologic comfort measures Outcome: Progressing   Problem: Health Behavior/Discharge Planning: Goal: Ability to manage health-related needs will improve Outcome: Progressing   Problem: Clinical Measurements: Goal: Ability to maintain clinical measurements within normal limits will improve Outcome: Progressing Goal: Will remain free from infection Outcome: Progressing Goal: Diagnostic test results will improve Outcome: Progressing Goal: Respiratory complications will improve Outcome: Progressing Goal: Cardiovascular complication will be avoided Outcome: Progressing   Problem: Activity: Goal: Risk for activity intolerance will decrease Outcome: Progressing   Problem: Nutrition: Goal: Adequate nutrition will be maintained Outcome: Progressing   Problem: Coping: Goal: Level of anxiety will decrease Outcome: Progressing   Problem: Elimination: Goal: Will not experience complications related to bowel motility Outcome: Progressing Goal: Will not experience complications related to urinary retention Outcome: Progressing    Problem: Pain Managment: Goal: General experience of comfort will improve and/or be controlled Outcome: Progressing   Problem: Safety: Goal: Ability to remain free from injury will improve Outcome: Progressing   Problem: Skin Integrity: Goal: Risk for impaired skin integrity will decrease Outcome: Progressing

## 2024-07-22 NOTE — Progress Notes (Signed)
" °   07/22/24 0150  BiPAP/CPAP/SIPAP  $ Non-Invasive Home Ventilator  Subsequent  BiPAP/CPAP/SIPAP Pt Type Adult  BiPAP/CPAP/SIPAP Resmed  Mask Type Full face mask  Dentures removed? Not applicable  Mask Size Medium  Respiratory Rate 18 breaths/min  IPAP 16 cmH20  EPAP 6 cmH2O  Flow Rate 4 lpm  Patient Home Machine No  Patient Home Mask No  Patient Home Tubing No  Auto Titrate No  Nasal massage performed Yes  Device Plugged into RED Power Outlet Yes  BiPAP/CPAP /SiPAP Vitals  Pulse Rate 64  Resp 18  SpO2 96 %  Bilateral Breath Sounds Expiratory wheezes    "

## 2024-07-22 NOTE — Progress Notes (Signed)
" °   07/22/24 2336  BiPAP/CPAP/SIPAP  BiPAP/CPAP/SIPAP Pt Type Adult  BiPAP/CPAP/SIPAP Resmed  Mask Type Full face mask  Dentures removed? Not applicable  Mask Size Medium  IPAP 16 cmH20  EPAP 6 cmH2O  Flow Rate 4 lpm  Patient Home Machine No  Patient Home Mask No  Patient Home Tubing No  Auto Titrate No  CPAP/SIPAP surface wiped down Yes  Device Plugged into RED Power Outlet Yes    "

## 2024-07-22 NOTE — Progress Notes (Signed)
" °  Progress Note   Patient: Pamela [name] FMW:[id number] DOB: 23-Jul-1950 DOA: 07/16/2024     6 DOS: the patient was seen and examined on 07/22/2024   Brief hospital course: 74 year old woman PMH including COPD, chronic hypoxic respiratory failure on 3 L nasal cannula, history of lung cancer, sarcoidosis, morbid obesity, OSA/OSH on BiPAP, presenting with shortness of breath and cough.  Admitted for COPD exacerbation.  Consultants None   Procedures/Events 12/28 admit for COPD exacerabation  Assessment and Plan: COPD/asthma exacerbation Acute on chronic hypoxic respiratory failure baseline 3 L Community-acquired pneumonia COVID, RSV, flu negative.  Procalcitonin negative.  Chest x-ray on my review shows perihilar infiltrate, lateral view shows anterior infiltrate.  EDP documented O2 sat dropped to 83% with ambulation. Slowly improving.  Continue bronchodilators, changed to prednisone  tomorrow.  Complete ceftriaxone  tomorrow.  Likely home in 48 hours.   OSA/OHS Continue BiPAP at night   RUL adenocarcinoma s/p SBRT Sarcoidosis    Chronic diastolic CHF Chronic lower extremity edema Last echocardiogram LVEF 60 to 65% Appears stable.   CKD stage IIIb Creatinine variable 1.1-1.5 Appears to be stable here   Microcytic anemia Hemoglobin stable.  Microcytosis longstanding Follow-up as an outpatient   Gout Continue allopurinol    Trivial troponin elevation Clinically insignificant   Class 3 obesity  Body mass index is 50.64 kg/m.     Subjective:  Slowly improving  Physical Exam: Vitals:   07/22/24 0150 07/22/24 0600 07/22/24 1134 07/22/24 1401  BP:  120/65  (!) 141/65  Pulse: 64 64  78  Resp: 18 (!) 21  16  Temp:  98.2 F (36.8 C)  98.9 F (37.2 C)  TempSrc:    Oral  SpO2: 96% 97% 98% 96%  Weight:      Height:       Physical Exam Vitals reviewed.  Constitutional:      General: She is not in acute distress.    Appearance: She is not ill-appearing or  toxic-appearing.  Cardiovascular:     Rate and Rhythm: Normal rate and regular rhythm.     Heart sounds: No murmur heard. Pulmonary:     Effort: Pulmonary effort is normal. No respiratory distress.     Breath sounds: Wheezing present. No rhonchi or rales.  Neurological:     Mental Status: She is alert.  Psychiatric:        Mood and Affect: Mood normal.        Behavior: Behavior normal.     Data Reviewed: No new data  Family Communication: none  Disposition: Status is: Inpatient Remains inpatient appropriate because: SOB     Time spent: 20 minutes  Author: Toribio Door, MD 07/22/2024 3:39 PM  For on call review [email or web address]    "

## 2024-07-23 DIAGNOSIS — J441 Chronic obstructive pulmonary disease with (acute) exacerbation: Secondary | ICD-10-CM | POA: Diagnosis not present

## 2024-07-23 DIAGNOSIS — N1832 Chronic kidney disease, stage 3b: Secondary | ICD-10-CM | POA: Diagnosis not present

## 2024-07-23 DIAGNOSIS — J9621 Acute and chronic respiratory failure with hypoxia: Secondary | ICD-10-CM | POA: Diagnosis not present

## 2024-07-23 DIAGNOSIS — J189 Pneumonia, unspecified organism: Secondary | ICD-10-CM | POA: Diagnosis not present

## 2024-07-23 NOTE — Progress Notes (Signed)
 Mobility Specialist - Progress Note  ([place] 4L) Pre-mobility: 88 bpm HR, 92% SpO2 During mobility: 90 bpm HR, 90% SpO2 Post-mobility: 85 bpm HR, 91% SPO2   07/23/24 1203  Mobility  Activity Ambulated with assistance  Level of Assistance Standby assist, set-up cues, supervision of patient - no hands on  Assistive Device Front wheel walker  Distance Ambulated (ft) 120 ft  Range of Motion/Exercises Active  Activity Response Tolerated well  Mobility visit 1 Mobility  Mobility Specialist Start Time (ACUTE ONLY) 1150  Mobility Specialist Stop Time (ACUTE ONLY) 1203  Mobility Specialist Time Calculation (min) (ACUTE ONLY) 13 min   Pt was found getting out of bathroom. Agreeable to mobilize. Pt grew fatigued with session. At EOS returned to recliner chair with all needs met. Call bell in reach.   Pamela [name],  Mobility Specialist Can be reached via Secure Chat

## 2024-07-23 NOTE — Plan of Care (Signed)
   Problem: Education: Goal: Knowledge of disease or condition will improve Outcome: Progressing Goal: Knowledge of the prescribed therapeutic regimen will improve Outcome: Progressing Goal: Individualized Educational Video(s) Outcome: Progressing   Problem: Activity: Goal: Ability to tolerate increased activity will improve Outcome: Progressing Goal: Will verbalize the importance of balancing activity with adequate rest periods Outcome: Progressing   Problem: Respiratory: Goal: Ability to maintain a clear airway will improve Outcome: Progressing Goal: Levels of oxygenation will improve Outcome: Progressing Goal: Ability to maintain adequate ventilation will improve Outcome: Progressing   Problem: Education: Goal: Knowledge of General Education information will improve Description: Including pain rating scale, medication(s)/side effects and non-pharmacologic comfort measures Outcome: Progressing   Problem: Health Behavior/Discharge Planning: Goal: Ability to manage health-related needs will improve Outcome: Progressing   Problem: Clinical Measurements: Goal: Ability to maintain clinical measurements within normal limits will improve Outcome: Progressing Goal: Will remain free from infection Outcome: Progressing Goal: Diagnostic test results will improve Outcome: Progressing Goal: Respiratory complications will improve Outcome: Progressing Goal: Cardiovascular complication will be avoided Outcome: Progressing   Problem: Activity: Goal: Risk for activity intolerance will decrease Outcome: Progressing   Problem: Nutrition: Goal: Adequate nutrition will be maintained Outcome: Progressing   Problem: Coping: Goal: Level of anxiety will decrease Outcome: Progressing   Problem: Elimination: Goal: Will not experience complications related to bowel motility Outcome: Progressing Goal: Will not experience complications related to urinary retention Outcome: Progressing    Problem: Pain Managment: Goal: General experience of comfort will improve and/or be controlled Outcome: Progressing   Problem: Safety: Goal: Ability to remain free from injury will improve Outcome: Progressing   Problem: Skin Integrity: Goal: Risk for impaired skin integrity will decrease Outcome: Progressing

## 2024-07-23 NOTE — Progress Notes (Signed)
" °  Progress Note   Patient: Pamela [name] FMW:[id number] DOB: 12-Nov-1950 DOA: 07/16/2024     7 DOS: the patient was seen and examined on 07/23/2024   Brief hospital course: 74 year old woman PMH including COPD, chronic hypoxic respiratory failure on 3 L nasal cannula, history of lung cancer, sarcoidosis, morbid obesity, OSA/OSH on BiPAP, presenting with shortness of breath and cough.  Admitted for COPD exacerbation.  Consultants None   Procedures/Events 12/28 admit for COPD exacerabation   Assessment and Plan: COPD/asthma exacerbation Acute on chronic hypoxic respiratory failure baseline 3 L Community-acquired pneumonia COVID, RSV, flu negative.  Procalcitonin negative.  Chest x-ray on my review shows perihilar infiltrate, lateral view shows anterior infiltrate.  EDP documented O2 sat dropped to 83% with ambulation. Continues to improve.  Continue bronchodilators.  Prednisone .  Finishes antibiotics today.  Likely home tomorrow.   OSA/OHS Continue BiPAP at night   RUL adenocarcinoma s/p SBRT Sarcoidosis    Chronic diastolic CHF Chronic lower extremity edema Last echocardiogram LVEF 60 to 65% Appears stable.   CKD stage IIIb Creatinine variable 1.1-1.5 Appears to be stable here   Microcytic anemia Hemoglobin stable.  Microcytosis longstanding Follow-up as an outpatient   Gout Continue allopurinol    Trivial troponin elevation Clinically insignificant   Class 3 obesity  Body mass index is 50.64 kg/m.      Subjective:  Overall improving  Physical Exam: Vitals:   07/22/24 1134 07/22/24 1401 07/22/24 2002 07/23/24 0440  BP:  (!) 141/65 (!) 143/72 133/72  Pulse:  78 79 72  Resp:  16 16 20   Temp:  98.9 F (37.2 C) 97.7 F (36.5 C) 98.3 F (36.8 C)  TempSrc:  Oral Oral Oral  SpO2: 98% 96% 99% 98%  Weight:      Height:       Physical Exam Vitals and nursing note reviewed.  Constitutional:      General: She is not in acute distress.    Appearance: She is  not ill-appearing or toxic-appearing.  Cardiovascular:     Rate and Rhythm: Normal rate and regular rhythm.     Heart sounds: No murmur heard. Pulmonary:     Effort: Pulmonary effort is normal. No respiratory distress.     Breath sounds: Wheezing present. No rhonchi or rales.  Musculoskeletal:     Right lower leg: Edema present.     Left lower leg: Edema present.  Neurological:     Mental Status: She is alert.  Psychiatric:        Mood and Affect: Mood normal.        Behavior: Behavior normal.     Data Reviewed: No new data  Family Communication: none  Disposition: Status is: Inpatient Remains inpatient appropriate because: COPD, pneumonia     Time spent: 20 minutes  Author: Toribio Door, MD 07/23/2024 8:07 AM  For on call review [email or web address]    "

## 2024-07-24 ENCOUNTER — Ambulatory Visit

## 2024-07-24 ENCOUNTER — Other Ambulatory Visit (HOSPITAL_COMMUNITY): Payer: Self-pay

## 2024-07-24 MED ORDER — GUAIFENESIN 100 MG/5ML PO LIQD
5.0000 mL | ORAL | 0 refills | Status: AC | PRN
  Filled 2024-07-24: qty 118, 4d supply, fill #0

## 2024-07-24 MED ORDER — PREDNISONE 10 MG PO TABS
ORAL_TABLET | ORAL | 0 refills | Status: AC
  Filled 2024-07-24: qty 21, 9d supply, fill #0

## 2024-07-24 NOTE — Progress Notes (Signed)
 AVS and discharge instructions reviewed w/ patient. Patient verbalized understanding. Patient waiting for ride.

## 2024-07-24 NOTE — Progress Notes (Signed)
 Discharge medications delivered to patient at the bedside in a secure bag.

## 2024-07-24 NOTE — TOC Transition Note (Signed)
 Transition of Care [facility]) - Discharge Note   Patient Details  Name: Margrett Kalb MRN: [id number] Date of Birth: 1951-04-10  Transition of Care [facility] CM/SW Contact:  Bascom Service, RN Phone Number: 07/24/2024, 11:36 AM   Clinical Narrative: Refer to past note for additional details 12/31-HHC/dme already set up. No further CM needs.      Final next level of care: Home w Home Health Services Barriers to Discharge: No Barriers Identified   Patient Goals and CMS Choice Patient states their goals for this hospitalization and ongoing recovery are:: Home CMS Medicare.gov Compare Post Acute Care list provided to:: Patient Represenative (must comment) Choice offered to / list presented to : Patient [place] ownership interest in [facility].provided to:: Patient    Discharge Placement                       Discharge Plan and Services Additional resources added to the After Visit Summary for     Discharge Planning Services: CM Consult Post Acute Care Choice: Home Health                    HH Arranged: PT, OT [facility] Agency: [facility] Date [facility] Agency Contacted: 07/19/24 Time HH Agency Contacted: 1450 Representative spoke with at [facility] Agency: [place]  Social Drivers of Health (SDOH) Interventions SDOH Screenings   Food Insecurity: No Food Insecurity (07/18/2024)  Housing: Low Risk (07/18/2024)  Transportation Needs: No Transportation Needs (07/18/2024)  Utilities: Not At Risk (07/18/2024)  Alcohol Screen: Low Risk (02/24/2024)  Depression (PHQ2-9): Low Risk (05/08/2024)  Financial Resource Strain: Medium Risk (02/24/2024)  Physical Activity: Insufficiently Active (02/24/2024)  Social Connections: Moderately Integrated (07/18/2024)  Stress: No Stress Concern Present (02/24/2024)  Tobacco Use: Low Risk (07/16/2024)  Health Literacy: Adequate Health Literacy (07/22/2023)     Readmission Risk Interventions    07/19/2024    2:53 PM 02/26/2023    6:27 PM  04/06/2022   12:12 PM  Readmission Risk Prevention Plan  Transportation Screening Complete Complete Complete  PCP or Specialist Appt within 5-7 Days   Complete  Home Care Screening   Complete  Medication Review (RN CM)   Complete  Medication Review [employer]) Complete Complete   PCP or Specialist appointment within 3-5 days of discharge Complete Complete   HRI or Home Care Consult Complete Complete   SW Recovery Care/Counseling Consult Complete Complete   Palliative Care Screening Not Applicable Not Applicable   Skilled Nursing Facility Not Applicable Not Applicable

## 2024-07-24 NOTE — Plan of Care (Signed)
 Pulmonary hygiene No pain  No acute changes overnight   Problem: Education: Goal: Individualized Educational Video(s) Outcome: Progressing   Problem: Respiratory: Goal: Ability to maintain a clear airway will improve Outcome: Progressing   Problem: Respiratory: Goal: Ability to maintain adequate ventilation will improve Outcome: Progressing

## 2024-07-24 NOTE — Discharge Summary (Addendum)
 " Physician Discharge Summary   Patient: Pamela [name] MRN: [id number] DOB: Aug 16, 1950  Admit date:     07/16/2024  Discharge date: 07/24/2024  Discharge Physician: Toribio Door   PCP: Paseda, [name], FNP   Recommendations at discharge:  Follow-up COPD exacerbation, anemia  Discharge Diagnoses: Principal Problem:   COPD with acute exacerbation (HCC) Active Problems:   Acute on chronic respiratory failure with hypoxia (HCC)   Morbid obesity with BMI of 50.0-59.9, adult (HCC)   OSA treated with BiPAP   Obesity hypoventilation syndrome (HCC)   Mixed hyperlipidemia   Exacerbation of asthma   Gouty arthritis   CAP (community acquired pneumonia)   Chronic kidney disease (CKD), stage III (moderate) (HCC)  Resolved Problems:   * No resolved hospital problems. *  Hospital Course: 74 year old woman PMH including COPD, chronic hypoxic respiratory failure on 3 L nasal cannula, history of lung cancer, sarcoidosis, morbid obesity, OSA/OSH on BiPAP, presenting with shortness of breath and cough.  Admitted for COPD exacerbation.  Condition gradually improved with standard therapy.  Discharged home in good condition.  Consultants None   Procedures/Events 12/28 admit for COPD exacerabation  COPD/asthma exacerbation Acute on chronic hypoxic respiratory failure baseline 3 L Community-acquired pneumonia COVID, RSV, flu negative.  Procalcitonin negative.  Chest x-ray on my review shows perihilar infiltrate, lateral view shows anterior infiltrate.  EDP documented O2 sat dropped to 83% with ambulation. Continues to improve.  Continue bronchodilators.  Prednisone .  Finished Zithromax .    OSA/OHS Continue BiPAP at night   RUL adenocarcinoma s/p SBRT Sarcoidosis    Chronic diastolic CHF Chronic lower extremity edema Last echocardiogram LVEF 60 to 65% Appears stable.   CKD stage IIIb Creatinine variable 1.1-1.5 Appears to be stable here   Microcytic anemia Hemoglobin stable.   Microcytosis longstanding Follow-up as an outpatient   Gout Continue allopurinol    Trivial troponin elevation Clinically insignificant   Class 3 obesity  Body mass index is 50.64 kg/m.  Disposition: Home health PT Diet recommendation:  Regular diet DISCHARGE MEDICATION: Allergies as of 07/24/2024       Reactions   Sulfa  Antibiotics Hives, Rash        Medication List     STOP taking these medications    azithromycin  250 MG tablet Commonly known as: ZITHROMAX    benzonatate  100 MG capsule Commonly known as: TESSALON    dextromethorphan -guaiFENesin  30-600 MG 12hr tablet Commonly known as: MUCINEX  DM   Vitamin D3 25 MCG (1000 UT) Caps       TAKE these medications    acetaminophen  500 MG tablet Commonly known as: TYLENOL  Take 1,000 mg by mouth daily as needed for mild pain (pain score 1-3).   albuterol  (2.5 MG/3ML) 0.083% nebulizer solution Commonly known as: PROVENTIL  Take 3 mLs (2.5 mg total) by nebulization daily as needed for wheezing or shortness of breath.   albuterol  108 (90 Base) MCG/ACT inhaler Commonly known as: ProAir  HFA Inhale 2 puffs into the lungs every 4 (four) hours as needed for wheezing or shortness of breath.   allopurinol  100 MG tablet Commonly known as: Zyloprim  Take 0.5 tablets (50 mg total) by mouth daily.   arformoterol  15 MCG/2ML Nebu Commonly known as: BROVANA  Take 2 mLs (15 mcg total) by nebulization 2 (two) times daily.   aspirin  EC 81 MG tablet Take 1 tablet (81 mg total) by mouth daily. Swallow whole.   atorvastatin  40 MG tablet Commonly known as: LIPITOR TAKE 1 TABLET BY MOUTH DAILY   B-complex with vitamin C tablet  Take 1 tablet by mouth daily.   budesonide  0.25 MG/2ML nebulizer solution Commonly known as: PULMICORT  INHALE THE CONTENTS OF 1 VIAL VIA NEBULIZER TWICE DAILY AS DIRECTED WITH ALBUTEROL    calcitRIOL  0.25 MCG capsule Commonly known as: ROCALTROL  Take 1 capsule (0.25 mcg total) by mouth daily.    cetirizine  10 MG chewable tablet Commonly known as: ZYRTEC  Chew 10 mg by mouth at bedtime.   Depend Underwear Large/XL Misc 1 each by Does not apply route 2 times daily at 12 noon and 4 pm.   diclofenac  Sodium 1 % Gel Commonly known as: VOLTAREN  Apply 2 g topically 4 (four) times daily.   ezetimibe  10 MG tablet Commonly known as: ZETIA  Take 1 tablet (10 mg total) by mouth daily.   famotidine  20 MG tablet Commonly known as: PEPCID  TAKE 1 TABLET AFTER SUPPER What changed:  how much to take how to take this when to take this   folic acid  1 MG tablet Commonly known as: FOLVITE  Take 1 tablet (1 mg total) by mouth daily.   furosemide  40 MG tablet Commonly known as: LASIX  Take 1 tablet (40 mg total) by mouth daily.   guaiFENesin  100 MG/5ML liquid Commonly known as: ROBITUSSIN Take 5 mLs by mouth every 4 (four) hours as needed for cough or to loosen phlegm.   hydrOXYzine  25 MG tablet Commonly known as: ATARAX  Take 1 tablet (25 mg total) by mouth 3 (three) times daily as needed for itching.   montelukast  10 MG tablet Commonly known as: SINGULAIR  Take 1 tablet (10 mg total) by mouth at bedtime.   pantoprazole  40 MG tablet Commonly known as: PROTONIX  TAKE 1 TABLET EVERY DAY 30 TO 60 MINUTES BEFORE FIRST MEAL OF THE DAY What changed:  how much to take how to take this when to take this   predniSONE  10 MG tablet Commonly known as: DELTASONE  Take 4 tablets (40 mg total) by mouth daily with breakfast for 3 days, THEN 2 tablets (20 mg total) daily with breakfast for 3 days, THEN 1 tablet (10 mg total) daily with breakfast for 3 days. Start taking on: July 25, 2024 What changed:  See the new instructions. Another medication with the same name was removed. Continue taking this medication, and follow the directions you see here.   Stool Softener 100 MG capsule Generic drug: docusate sodium  Take 100 mg by mouth daily.        Contact information for follow-up  providers     [facility] Follow up.   Specialty: Pulmonology Why: Office will contact you with an appointment Contact information: [address] [place] [address] [place]  [phone] Additional information: [address]  [address]  [place], [place] [address]             Contact information for after-discharge care     Home Medical Care     [facility]) .   Service: Home Health Services Why: HHPT/OT Contact information: [address] [place]  [phone]                    Discharge Exam: Fredricka Weights   07/16/24 1917  Weight: 133.8 kg   Physical Exam Vitals reviewed.  Constitutional:      General: She is not in acute distress.    Appearance: She is not ill-appearing or toxic-appearing.  Cardiovascular:     Rate and Rhythm: Normal rate  and regular rhythm.     Heart sounds: No murmur heard. Pulmonary:     Effort: Pulmonary effort is normal. No respiratory distress.     Breath sounds: Wheezing present. No rales.  Chest:     Chest wall: No tenderness.  Neurological:     Mental Status: She is alert.  Psychiatric:        Mood and Affect: Mood normal.        Behavior: Behavior normal.      Condition at discharge: good  The results of significant diagnostics from this hospitalization (including imaging, microbiology, ancillary and laboratory) are listed below for reference.   Imaging Studies: DG Chest 2 View Result Date: 07/16/2024 EXAM: 2 VIEW(S) XRAY OF THE CHEST 07/16/2024 07:37:00 PM COMPARISON: 04/11/2024 CLINICAL HISTORY: shortness of breath FINDINGS: LUNGS AND PLEURA: There is central peribronchial wall thickening bilaterally with streaky perihilar opacities, right greater than left. No pleural effusion. No pneumothorax. HEART AND MEDIASTINUM: Left chest cardiac loop recorder or leadless pacemaker in place.  Aortic arch calcifications. BONES AND SOFT TISSUES: Thoracic spondylosis. IMPRESSION: 1. Central peribronchial wall thickening bilaterally with streaky perihilar opacities pacemaker for infection/inflammation. Electronically signed by: Greig Pique MD 07/16/2024 08:02 PM EST RP Workstation: [id number]    Microbiology: Results for orders placed or performed during the hospital encounter of 07/16/24  Resp panel by RT-PCR (RSV, Flu A&B, Covid) Anterior Nasal Swab     Status: None   Collection Time: 07/16/24  7:55 PM   Specimen: Anterior Nasal Swab  Result Value Ref Range Status   SARS Coronavirus 2 by RT PCR NEGATIVE NEGATIVE Final    Comment: (NOTE) SARS-CoV-2 target nucleic acids are NOT DETECTED.  The SARS-CoV-2 RNA is generally detectable in upper respiratory specimens during the acute phase of infection. The lowest concentration of SARS-CoV-2 viral copies this assay can detect is 138 copies/mL. A negative result does not preclude SARS-Cov-2 infection and should not be used as the sole basis for treatment or other patient management decisions. A negative result may occur with  improper specimen collection/handling, submission of specimen other than nasopharyngeal swab, presence of viral mutation(s) within the areas targeted by this assay, and inadequate number of viral copies(<138 copies/mL). A negative result must be combined with clinical observations, patient history, and epidemiological information. The expected result is Negative.  Fact Sheet for Patients:  bloggercourse.com  Fact Sheet for Healthcare Providers:  seriousbroker.it  This test is no t yet approved or cleared by the [place]  FDA and  has been authorized for detection and/or diagnosis of SARS-CoV-2 by FDA under an Emergency Use Authorization (EUA). This EUA will remain  in effect (meaning this test can be used) for the duration of the COVID-19 declaration  under Section 564(b)(1) of the Act, 21 U.S.C.section 360bbb-3(b)(1), unless the authorization is terminated  or revoked sooner.       Influenza A by PCR NEGATIVE NEGATIVE Final   Influenza B by PCR NEGATIVE NEGATIVE Final    Comment: (NOTE) The Xpert Xpress SARS-CoV-2/FLU/RSV plus assay is intended as an aid in the diagnosis of influenza from Nasopharyngeal swab specimens and should not be used as a sole basis for treatment. Nasal washings and aspirates are unacceptable for Xpert Xpress SARS-CoV-2/FLU/RSV testing.  Fact Sheet for Patients: bloggercourse.com  Fact Sheet for Healthcare Providers: seriousbroker.it  This test is not yet approved or cleared by the [place]  FDA and has been authorized for detection and/or diagnosis of SARS-CoV-2 by FDA under an Emergency Use Authorization (EUA). This  EUA will remain in effect (meaning this test can be used) for the duration of the COVID-19 declaration under Section 564(b)(1) of the Act, 21 U.S.C. section 360bbb-3(b)(1), unless the authorization is terminated or revoked.     Resp Syncytial Virus by PCR NEGATIVE NEGATIVE Final    Comment: (NOTE) Fact Sheet for Patients: bloggercourse.com  Fact Sheet for Healthcare Providers: seriousbroker.it  This test is not yet approved or cleared by the [place]  FDA and has been authorized for detection and/or diagnosis of SARS-CoV-2 by FDA under an Emergency Use Authorization (EUA). This EUA will remain in effect (meaning this test can be used) for the duration of the COVID-19 declaration under Section 564(b)(1) of the Act, 21 U.S.C. section 360bbb-3(b)(1), unless the authorization is terminated or revoked.  Performed at [facility], [address] [place], [place] [address]     Labs: CBC: Recent Labs  Lab 07/18/24 0628 07/19/24 0615  WBC 5.4 6.0  HGB  9.7* 9.7*  HCT 32.8* 33.0*  MCV 75.8* 74.5*  PLT 164 159   Basic Metabolic Panel: Recent Labs  Lab 07/18/24 0628 07/19/24 0615  NA 142 142  K 4.7 5.1  CL 101 101  CO2 34* 34*  GLUCOSE 102* 103*  BUN 24* 31*  CREATININE 1.26* 1.31*  CALCIUM  8.9 8.7*   Liver Function Tests: No results for input(s): AST, ALT, ALKPHOS, BILITOT, PROT, ALBUMIN in the last 168 hours. CBG: No results for input(s): GLUCAP in the last 168 hours.  Discharge time spent: less than 30 minutes.  Signed: Toribio Door, MD [facility] 07/24/2024 "

## 2024-07-25 ENCOUNTER — Telehealth: Payer: Self-pay

## 2024-07-25 NOTE — Transitions of Care (Post Inpatient/ED Visit) (Signed)
" ° °  07/25/2024  Name: Pamela [name] MRN: [id number] DOB: 14-Dec-1950  Today's TOC FU Call Status: Today's TOC FU Call Status:: Unsuccessful Call (1st Attempt) Unsuccessful Call (1st Attempt) Date: 07/25/24  Attempted to reach the patient regarding the most recent Inpatient/ED visit.  Follow Up Plan: Additional outreach attempts will be made to reach the patient to complete the Transitions of Care (Post Inpatient/ED visit) call.   Arvin Seip RN, BSN, CCM [employer], Population Health Case Manager Phone: [phone]  "

## 2024-07-26 ENCOUNTER — Telehealth: Payer: Self-pay

## 2024-07-26 NOTE — Transitions of Care (Post Inpatient/ED Visit) (Signed)
" ° °  07/26/2024  Name: Pamela [name] MRN: [id number] DOB: 10-25-1950  Today's TOC FU Call Status: Today's TOC FU Call Status:: Unsuccessful Call (2nd Attempt) Unsuccessful Call (2nd Attempt) Date: 07/26/24  Attempted to reach the patient regarding the most recent Inpatient/ED visit.  Follow Up Plan: Additional outreach attempts will be made to reach the patient to complete the Transitions of Care (Post Inpatient/ED visit) call.   Alan Ee, RN, BSN, CEN Population Health- Transition of Care Team.  [facility] [phone]  "

## 2024-07-27 ENCOUNTER — Telehealth: Payer: Self-pay

## 2024-07-27 DIAGNOSIS — J441 Chronic obstructive pulmonary disease with (acute) exacerbation: Secondary | ICD-10-CM

## 2024-07-27 NOTE — Transitions of Care (Post Inpatient/ED Visit) (Signed)
 "  07/27/2024  Name: Pamela [name] MRN: [id number] DOB: April 30, 1951  Today's TOC FU Call Status: Today's TOC FU Call Status:: Successful TOC FU Call Completed TOC FU Call Complete Date: 07/27/24  Patient's Name and Date of Birth confirmed. Name, DOB  Transition Care Management Follow-up Telephone Call Date of Discharge: 07/24/24 Discharge Facility: [facility]) Type of Discharge: Inpatient Admission Primary Inpatient Discharge Diagnosis:: COPD with acute exacerbation How have you been since you were released from the hospital?: Better Any questions or concerns?: No  Items Reviewed: Did you receive and understand the discharge instructions provided?: Yes Medications obtained,verified, and reconciled?: Yes (Medications Reviewed) Any new allergies since your discharge?: No Dietary orders reviewed?: Yes Type of Diet Ordered:: regular diet Do you have support at home?: Yes People in Home [RPT]: child(ren), adult Name of Support/Comfort Primary Source: Olandes Cuffie and Candace Roehrs  Medications Reviewed Today: Medications Reviewed Today     Reviewed by Susannah Carbin E, RN (Registered Nurse) on 07/27/24 at 1524  Med List Status: <None>   Medication Order Taking? Sig Documenting Provider Last Dose Status Informant  acetaminophen  (TYLENOL ) 500 MG tablet [id number] Yes Take 1,000 mg by mouth daily as needed for mild pain (pain score 1-3). [provider]  Active Self, Pharmacy Records           Med Note (CRUTHIS, SHEFFIELD JAYSON Kitchens Aug 17, 2022  7:29 AM)    albuterol  (PROAIR  HFA) [phone] Base) MCG/ACT inhaler [id number] Yes Inhale 2 puffs into the lungs every 4 (four) hours as needed for wheezing or shortness of breath. Paseda, [name], FNP  Active Self, Pharmacy Records  albuterol  (PROVENTIL ) (2.5 MG/3ML) 0.083% nebulizer solution [id number] Yes Take 3 mLs (2.5 mg total) by nebulization daily as needed for wheezing or shortness of breath. Rizwan, Saima, MD  Active Self,  Pharmacy Records  allopurinol  (ZYLOPRIM ) 100 MG tablet [id number] Yes Take 0.5 tablets (50 mg total) by mouth daily. Paseda, [name], FNP  Active Self, Pharmacy Records  arformoterol  (BROVANA ) 15 MCG/2ML NEBU [id number] Yes Take 2 mLs (15 mcg total) by nebulization 2 (two) times daily. Parrett, Madelin RAMAN, NP  Active Self, Pharmacy Records  aspirin  EC 81 MG tablet [id number] Yes Take 1 tablet (81 mg total) by mouth daily. Swallow whole. Santo Stanly LABOR, MD  Active Self, Pharmacy Records  atorvastatin  (LIPITOR) 40 MG tablet [id number] Yes TAKE 1 TABLET BY MOUTH DAILY Paseda, [name], FNP  Active Self, Pharmacy Records  B Complex-C (B-COMPLEX WITH VITAMIN C) tablet [id number] Yes Take 1 tablet by mouth daily. Tobie Yetta HERO, MD  Active Self, Pharmacy Records           Med Note (CRUTHIS, SHEFFIELD JAYSON   Thu Mar 02, 2024  7:16 AM)    budesonide  (PULMICORT ) 0.25 MG/2ML nebulizer solution [id number] Yes INHALE THE CONTENTS OF 1 VIAL VIA NEBULIZER TWICE DAILY AS DIRECTED WITH ALBUTEROL  [name], Michael B, MD  Active Self, Pharmacy Records  calcitRIOL  (ROCALTROL ) 0.25 MCG capsule [id number] Yes Take 1 capsule (0.25 mcg total) by mouth daily. Paseda, [name], FNP  Active Self, Pharmacy Records  cetirizine  (ZYRTEC ) 10 MG chewable tablet [id number] Yes Chew 10 mg by mouth at bedtime. [provider]  Active Self, Pharmacy Records  diclofenac  Sodium (VOLTAREN ) 1 % GEL [id number] Yes Apply 2 g topically 4 (four) times daily. Tobie Yetta HERO, MD  Active Self, Pharmacy Records  docusate sodium  (STOOL SOFTENER) 100 MG capsule [id number] Yes Take 100 mg by mouth daily.  [provider]  Active Self, Pharmacy Records           Med Note (CRUTHIS, SHEFFIELD JAYSON Kitchens Aug 17, 2022  7:30 AM)    ezetimibe  (ZETIA ) 10 MG tablet [id number] Yes Take 1 tablet (10 mg total) by mouth daily. Paseda, [name], FNP  Active Self, Pharmacy Records  famotidine  (PEPCID ) 20 MG tablet [id number] Yes TAKE 1 TABLET AFTER SUPPER   Patient taking differently: Take 20 mg by mouth at bedtime. TAKE 1 TABLET AFTER SUPPER   Paseda, [name], FNP  Active Self, Pharmacy Records  folic acid  (FOLVITE ) 1 MG tablet [id number] Yes Take 1 tablet (1 mg total) by mouth daily. Tobie Yetta HERO, MD  Active Self, Pharmacy Records  furosemide  (LASIX ) 40 MG tablet [id number] Yes Take 1 tablet (40 mg total) by mouth daily. Paseda, [name], FNP  Active Self, Pharmacy Records  guaiFENesin  [facility]) 100 MG/5ML liquid [id number] Yes Take 5 mLs by mouth every 4 (four) hours as needed for cough or to loosen phlegm. Jadine Toribio SQUIBB, MD  Active   hydrOXYzine  (ATARAX ) 25 MG tablet [id number] Yes Take 1 tablet (25 mg total) by mouth 3 (three) times daily as needed for itching. Paseda, [name], FNP  Active Self, Pharmacy Records  Incontinence Supply Disposable (DEPEND UNDERWEAR LARGE/XL) MISC [id number] Yes 1 each by Does not apply route 2 times daily at 12 noon and 4 pm. Tilford Bertram HERO, FNP  Active Self, Pharmacy Records  montelukast  (SINGULAIR ) 10 MG tablet [id number] Yes Take 1 tablet (10 mg total) by mouth at bedtime. Paseda, [name], FNP  Active Self, Pharmacy Records  pantoprazole  (PROTONIX ) 40 MG tablet [id number] Yes TAKE 1 TABLET EVERY DAY 30 TO 60 MINUTES BEFORE FIRST MEAL OF THE DAY  Patient taking differently: Take 40 mg by mouth daily. TAKE 1 TABLET EVERY DAY 30 TO 60 MINUTES BEFORE FIRST MEAL OF THE DAY   Paseda, [name], FNP  Active Self, Pharmacy Records  predniSONE  (DELTASONE ) 10 MG tablet [id number] Yes Take 4 tablets (40 mg total) by mouth daily with breakfast for 3 days, THEN 2 tablets (20 mg total) daily with breakfast for 3 days, THEN 1 tablet (10 mg total) daily with breakfast for 3 days. Jadine Toribio SQUIBB, MD  Active             Home Care and Equipment/Supplies: Were Home Health Services Ordered?: Yes Name of Home Health Agency:: [facility] Has Agency set up a time to come to your home?:  Yes First Home Health Visit Date: 07/27/24 Any new equipment or medical supplies ordered?: No  Functional Questionnaire: Do you need assistance with bathing/showering or dressing?: No Do you need assistance with meal preparation?: No Do you need assistance with eating?: No Do you have difficulty maintaining continence: No Do you need assistance with getting out of bed/getting out of a chair/moving?: No Do you have difficulty managing or taking your medications?: No  Follow up appointments reviewed: PCP Follow-up appointment confirmed?: No (patient states she is waiting to hear back from the pulmonary office before scheduling with her primary care provider) Specialist Hospital Follow-up appointment confirmed?: No Reason Specialist Follow-Up Not Confirmed: Patient has Specialist Provider Number and will Call for Appointment (patient states she called the pulmonary office today to schedule appointment. She states the office said they would call her back to schedule.) Do you need transportation to your follow-up appointment?: No (Has public transportation ([facility])) Do you understand care options if your  condition(s) worsen?: Yes-patient verbalized understanding  SDOH Interventions Today    Flowsheet Row Most Recent Value  SDOH Interventions   Food Insecurity Interventions Intervention Not Indicated  Housing Interventions Intervention Not Indicated  Transportation Interventions Intervention Not Indicated  Utilities Interventions Intervention Not Indicated   Discussed and offered 30 day TOC program verbally agreed.  The patient has been provided with contact information for the care management team and has been advised to call with any health -related questions or concerns.  The patient verbalized understanding with current plan of care.  The patient is directed to their insurance card regarding availability of benefits coverage.    Arvin Seip RN, BSN, CCM [employer], Population Health Case Manager Phone: [phone]  "

## 2024-07-27 NOTE — Patient Instructions (Signed)
 Visit Information  Thank you for taking time to visit with me today. Please don't hesitate to contact me if I can be of assistance to you before our next scheduled telephone appointment.  Our next appointment is by telephone on 08/01/24 at 11 am  Following is a copy of your care plan:   Goals Addressed             This Visit's Progress    VBCI Transitions of Care (TOC) Care Plan       Problems:  Recent Hospitalization for treatment of COPD No Hospital Follow Up Provider appointment patient states she will call and schedule visit and No Specialist appointment patient states she is waiting to hear back from pulmonology office to schedule  Goal:  Over the next 30 days, the patient will not experience hospital readmission  Interventions:   COPD Interventions: Advised patient to self assesses COPD action plan zone and make appointment with provider if in the yellow zone for 48 hours without improvement Assessed social determinant of health barriers Discussed the importance of adequate rest and management of fatigue with COPD Provided education about and advised patient to utilize infection prevention strategies to reduce risk of respiratory infection Use of home oxygen  Referral to social worker for community resources/ patient reported concerns about her finances Advised to maintain adequate hydration if not contraindicated Confirmed patient using inhaler and nebulizer as prescribed Discussed proper home use of oxygen  and discussed oxygen  safety Take medications as prescribed Schedule hospital follow visit with pulmonologist and primary care provider Confirmed patient contacted by home health.  Notified primary care provider of patients enrollment in the 30 day TOC program  Patient Self Care Activities:  Attend all scheduled provider appointments Call pharmacy for medication refills 3-7 days in advance of running out of medications Call provider office for new concerns or  questions  Notify RN Care Manager of TOC call rescheduling needs Take medications as prescribed   limit outdoor activity during cold weather do breathing exercises every day follow rescue plan if symptoms flare-up keep follow-up appointments: scheduled hospital follow up visit with primary care provider and pulmonologist. Stay hydrated  Plan:  Telephone follow up appointment with care management team member scheduled for:  08/01/24 at 11 am The patient has been provided with contact information for the care management team and has been advised to call with any health related questions or concerns.         Patient verbalizes understanding of instructions and care plan provided today and agrees to view in MyChart. Active MyChart status and patient understanding of how to access instructions and care plan via MyChart confirmed with patient.     The patient has been provided with contact information for the care management team and has been advised to call with any health related questions or concerns.   Please call the care guide team at [phone] if you need to cancel or reschedule your appointment.   Please call the Suicide and Crisis Lifeline: 988 call the [place]  National Suicide Prevention Lifeline: (346)548-0827 or TTY: 605-055-3649 TTY (540)609-0772) to talk to a trained counselor call 1-800-273-TALK (toll free, 24 hour hotline) if you are experiencing a Mental Health or Behavioral Health Crisis or need someone to talk to.  Arvin Seip RN, BSN, CCM [employer], Population Health Case Manager Phone: [phone]

## 2024-07-27 NOTE — Telephone Encounter (Signed)
 Copied from CRM [id number]. Topic: Clinical - Home Health Verbal Orders >> Jul 27, 2024 12:17 PM Deaijah H wrote: Caller/Agency: [facility] Callback Number: [phone] (may leave voicemail) Service Requested: Physical Therapy Frequency: 1x for 9wks / skilled nurse eval. Any new concerns about the patient? No

## 2024-07-28 NOTE — Telephone Encounter (Signed)
 Lvm for approval. [facility]

## 2024-08-01 ENCOUNTER — Encounter: Payer: Self-pay | Admitting: Adult Health

## 2024-08-01 ENCOUNTER — Encounter: Payer: Self-pay | Admitting: Licensed Clinical Social Worker

## 2024-08-01 ENCOUNTER — Other Ambulatory Visit: Admitting: Licensed Clinical Social Worker

## 2024-08-01 ENCOUNTER — Ambulatory Visit: Admitting: Adult Health

## 2024-08-01 VITALS — BP 101/62 | HR 68 | Temp 97.8°F | Ht 64.0 in | Wt 301.8 lb

## 2024-08-01 DIAGNOSIS — Z6841 Body Mass Index (BMI) 40.0 and over, adult: Secondary | ICD-10-CM

## 2024-08-01 DIAGNOSIS — D649 Anemia, unspecified: Secondary | ICD-10-CM | POA: Diagnosis not present

## 2024-08-01 DIAGNOSIS — J9611 Chronic respiratory failure with hypoxia: Secondary | ICD-10-CM | POA: Diagnosis not present

## 2024-08-01 DIAGNOSIS — G4733 Obstructive sleep apnea (adult) (pediatric): Secondary | ICD-10-CM | POA: Diagnosis not present

## 2024-08-01 DIAGNOSIS — J441 Chronic obstructive pulmonary disease with (acute) exacerbation: Secondary | ICD-10-CM

## 2024-08-01 DIAGNOSIS — I5032 Chronic diastolic (congestive) heart failure: Secondary | ICD-10-CM

## 2024-08-01 NOTE — Patient Outreach (Signed)
 Social Drivers of Health  Community Resource and Care Coordination Visit Note   08/01/2024  Name: Pamela [name] MRN: [id number] DOB:[birth year]  Situation: Referral received for [facility] needs assessment and assistance related to Financial Strain  Medicaid application . I obtained verbal consent from Patient.  Visit completed with Patient on the phone.   Background:      Assessment:   Goals Addressed             This Visit's Progress    BSWVBCI Social Work Care Plan       Current SDOH Barriers:  Financial constraints related to high light bill at the home where she lives with her son and patient wants to apply for Medicaid.   Interventions: Referred patient to community resources  SW made a referral to the walk in clinic for Medicaid. Patietn has already applied for Medicaid at DSS.           Recommendation:   attend all scheduled provider appointments Talk the Medicaid walk in clinic when they call and wait for the DSS Application decision.    Follow Up Plan:   Telephone follow up appointment date/time:  08/22/2024 at 10:00 am  Tobias CHARM [name] HEDWIG, PhD [facility], [facility] Social Worker Direct Dial: [phone]  Fax: [phone]

## 2024-08-01 NOTE — Patient Instructions (Addendum)
 Continue on Budesonide  and Brovana  neb Twice daily   Albuterol  inhaler or neb every 6hr as needed.  Singulair  10mg  At bedtime  Zyrtec  10mg  daily  Albuterol  inhaler or neb As needed   Continue on Oxygen  3-4 l/m to keep O2 sats >90%.  Continue on Lasix  40mg  daily  Continue on BIPAP ST machine At bedtime  and with naps.  Follow up next month as planned and As needed

## 2024-08-01 NOTE — Progress Notes (Signed)
 "  @Patient  ID: Pamela [name], female    DOB: [birth year], 74 y.o.   MRN: [id number]  Chief Complaint  Patient presents with   Medical Management of Chronic Issues    HOSPITAL F/U    Referring provider: Paseda, [name], FNP  HPI: 74 year old female never smoker (strong secondhand smoke exposure) followed for chronic asthma with allergic phenotype, sarcoidosis, history of lung cancer diagnosed in 2012 status post SBRT, chronic hypoxic and hypercarbic respiratory failure on oxygen  2013 through 2018 and then restarted in 2023 Medical history significant for right anterior chest wall mass/right pectoralis minor muscle mass with biopsy done 2015 which was nondiagnostic, PET scan October 2016 showed no changes in metabolic activity suggestive of a benign etiology.  She has history of chronic kidney disease, congestive heart failure, PFO on echo bubble study and previous stroke -dx of Sarcoid from previous bx. (followed previously by Dr. Seleta at [facility], with lymphadenopathy in chest , abd w/ possible spleen and liver involvement.    TEST/EVENTS : Reviewed 08/01/2024  CT chest Nov 20, 2021 similar posttreatment fibrosis/consolidation with volume loss in the right upper lobe.  No evidence of recurrence, tiny right upper lobe pulmonary nodule no suspicious pulmonary nodules.  A 5.5 x 2.1 cm soft tissue mass in the right pectoralis major and minor musculature.    03/2022 CT chest neg for PE, stable RUL scarring likely radiation changes.    CT chest Nov 30, 2022 stable platelike scarring/radiation changes in the right upper lobe, no evidence of recurrent or metastatic disease  CT chest Nov 23, 2023 stable radiation changes in the right upper lobe no findings suspicious for recurrent tumor no new pulmonary lesions stable soft tissue mass in the right pectoralis minor muscle   pfts 07/31/13  FEV1  1.53 (58%) with ratio 66 > p saba ratio 74 FEV1 1.68 (64%)    FENO 06/01/2016  =   85     Spirometry 02/22/2018  FEV1 1.23 (57%)  Ratio 71 with min curvature  - FENO 02/22/2018  = 61   05/2022 PFTs today show severe restriction with FEV1 at 51%, ratio 85, FVC 46%, no significant bronchodilator response, DLCO 69%.    Split night sleep study in 07/2014 showed mild OSA only with AHI 6.9. O2 sats nml on 2l/m .       11/2022 Absolute Eosinophil 1,500   Discussed the use of AI scribe software for clinical note transcription with the patient, who gave verbal consent to proceed.  History of Present Illness Pamela [name] is a 74 year old female with chronic  asthma who presents for a post-hospital follow-up after a recent hospitalization for respiratory distress.  She was hospitalized on December 28th after experiencing severe respiratory distress that began on Christmas Day.  She struggled to breathe and found it difficult to move from her bedroom to the bathroom, prompting her to seek hospital care.  She was admitted treated for possible community-acquired pneumonia with perihilar infiltrate.  Viral panel was negative.  She was treated with a Z-Pak and prednisone .  Patient took her last dose of prednisone  today.  She says she is feeling better feels that she is back to baseline.  She remains on budesonide  and Brovana  nebulizer twice daily.  She is taking Zyrtec  and Singulair  daily.    She remains on oxygen  3 to 4 L during the daytime.  She is on nocturnal BiPAP ST device at bedtime.  She has good compliance and control.  She has  perceived benefit.  She is very sensitive to environmental changes and smoke, which she identifies as triggers for her respiratory symptoms. She has never smoked but was exposed to secondhand smoke from her father and husband, which she believes contributed to her lung issues.  She lives with her son and his family, maintaining independence in her daily activities, including dressing and preparing meals. She uses a rolling walker and portable oxygen  when needed. She  does not drive but manages her transportation needs.  She is currently feeling well post-hospitalization and reports a significant improvement in her symptoms. No significant coughing post-hospitalization and improved breathing since discharge.     Allergies[1]  Immunization History  Administered Date(s) Administered   Fluad Quad(high Dose 65+) 07/28/2023   INFLUENZA, HIGH DOSE SEASONAL PF 04/11/2024   Influenza,inj,Quad PF,6+ Mos 04/13/2014, 04/16/2015, 04/03/2016, 05/19/2017, 06/15/2018, 05/14/2019, 04/21/2022   PFIZER(Purple Top)SARS-COV-2 Vaccination 10/14/2019, 11/08/2019, 07/08/2020, 03/06/2021   PNEUMOCOCCAL CONJUGATE-20 03/06/2021   Pneumococcal Conjugate-13 04/03/2016   Pneumococcal Polysaccharide-23 04/13/2014, 12/26/2019   Tdap 01/02/2015   Zoster, Live 05/21/2014    Past Medical History:  Diagnosis Date   Arthritis    Asthma    Cancer (HCC)    lung, adenocarcinoma right lung 2012   CHF (congestive heart failure) (HCC)    Preserved EF   Congenital single kidney    With chronic kidney disease   COPD (chronic obstructive pulmonary disease) (HCC)    Gout    Hypertension    Lung cancer (HCC) 2012   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 Patient notes it was consistent with sarcoidosis but actual pathology results not available.   Oxygen  deficiency    Sarcoidosis    Secondary hyperparathyroidism of renal origin 05/08/2024   Sickle cell trait    Sleep apnea     Tobacco History: Tobacco Use History[2] Counseling given: Not Answered   Outpatient Medications Prior to Visit  Medication Sig Dispense Refill   acetaminophen  (TYLENOL ) 500 MG tablet Take 1,000 mg by mouth daily as needed for mild pain (pain score 1-3).     albuterol  (PROAIR  HFA) 108 (90 Base) MCG/ACT inhaler Inhale 2 puffs  into the lungs every 4 (four) hours as needed for wheezing or shortness of breath. 8 g 5   albuterol  (PROVENTIL ) (2.5 MG/3ML) 0.083% nebulizer solution Take 3 mLs (2.5 mg total) by nebulization daily as needed for wheezing or shortness of breath. 270 mL 0   allopurinol  (ZYLOPRIM ) 100 MG tablet Take 0.5 tablets (50 mg total) by mouth daily. 60 tablet 2   arformoterol  (BROVANA ) 15 MCG/2ML NEBU Take 2 mLs (15 mcg total) by nebulization 2 (two) times daily. 120 mL 5   aspirin  EC 81 MG tablet Take 1 tablet (81 mg total) by mouth daily. Swallow whole. 30 tablet 11   atorvastatin  (LIPITOR) 40 MG tablet TAKE 1 TABLET BY MOUTH DAILY 90 tablet 3   B Complex-C (B-COMPLEX WITH VITAMIN C) tablet Take 1 tablet by mouth daily. 120 tablet 0   budesonide  (PULMICORT ) 0.25 MG/2ML nebulizer solution INHALE THE CONTENTS OF 1 VIAL VIA NEBULIZER TWICE DAILY AS DIRECTED WITH ALBUTEROL  360 mL 3   calcitRIOL  (ROCALTROL ) 0.25 MCG capsule Take 1 capsule (0.25 mcg total) by mouth daily. 90 capsule 3   cetirizine  (ZYRTEC ) 10 MG chewable tablet Chew 10 mg by mouth  at bedtime.     diclofenac  Sodium (VOLTAREN ) 1 % GEL Apply 2 g topically 4 (four) times daily. 100 g 0   docusate sodium  (STOOL SOFTENER) 100 MG capsule Take 100 mg by mouth daily.     ezetimibe  (ZETIA ) 10 MG tablet Take 1 tablet (10 mg total) by mouth daily. 90 tablet 3   famotidine  (PEPCID ) 20 MG tablet TAKE 1 TABLET AFTER SUPPER (Patient taking differently: Take 20 mg by mouth at bedtime. TAKE 1 TABLET AFTER SUPPER) 90 tablet 3   folic acid  (FOLVITE ) 1 MG tablet Take 1 tablet (1 mg total) by mouth daily. 120 tablet 0   furosemide  (LASIX ) 40 MG tablet Take 1 tablet (40 mg total) by mouth daily. 90 tablet 3   guaiFENesin  (ROBITUSSIN) 100 MG/5ML liquid Take 5 mLs by mouth every 4 (four) hours as needed for cough or to loosen phlegm. 118 mL 0   hydrOXYzine  (ATARAX ) 25 MG tablet Take 1 tablet (25 mg total) by mouth 3 (three) times daily as needed for itching. 30 tablet 0    Incontinence Supply Disposable (DEPEND UNDERWEAR LARGE/XL) MISC 1 each by Does not apply route 2 times daily at 12 noon and 4 pm. 17 each 12   montelukast  (SINGULAIR ) 10 MG tablet Take 1 tablet (10 mg total) by mouth at bedtime. 90 tablet 3   pantoprazole  (PROTONIX ) 40 MG tablet TAKE 1 TABLET EVERY DAY 30 TO 60 MINUTES BEFORE FIRST MEAL OF THE DAY 90 tablet 3   predniSONE  (DELTASONE ) 10 MG tablet Take 4 tablets (40 mg total) by mouth daily with breakfast for 3 days, THEN 2 tablets (20 mg total) daily with breakfast for 3 days, THEN 1 tablet (10 mg total) daily with breakfast for 3 days. 21 tablet 0   No facility-administered medications prior to visit.     Review of Systems:   Constitutional:   No  weight loss, night sweats,  Fevers, chills, +fatigue, or  lassitude.  HEENT:   No headaches,  Difficulty swallowing,  Tooth/dental problems, or  Sore throat,                No sneezing, itching, ear ache, nasal congestion, post nasal drip,   CV:  No chest pain,  Orthopnea, PND, swelling in lower extremities, anasarca, dizziness, palpitations, syncope.   GI  No heartburn, indigestion, abdominal pain, nausea, vomiting, diarrhea, change in bowel habits, loss of appetite, bloody stools.   Resp: .  No chest wall deformity  Skin: no rash or lesions.  GU: no dysuria, change in color of urine, no urgency or frequency.  No flank pain, no hematuria   MS:  No joint pain or swelling.  No decreased range of motion.  No back pain.    Physical Exam  BP 101/62   Pulse 68   Temp 97.8 F (36.6 C)   Ht 5' 4 (1.626 m) Comment: per pt  Wt (!) 301 lb 12.8 oz (136.9 kg)   SpO2 98% Comment: 3L pulse o2  BMI 51.80 kg/m   GEN: A/Ox3; pleasant , NAD,    HEENT:  [place]/AT,   NOSE-clear, THROAT-clear, no lesions, no postnasal drip or exudate noted.   NECK:  Supple w/ fair ROM; no JVD; normal carotid impulses w/o bruits; no thyromegaly or nodules palpated; no lymphadenopathy.    RESP  Clear  P & A; w/o,  wheezes/ rales/ or rhonchi. no accessory muscle use, no dullness to percussion  CARD:  RRR, no m/r/g, 2+ peripheral edema, pulses  intact, no cyanosis or clubbing.  GI:   Soft & nt; nml bowel sounds; no organomegaly or masses detected.   Musco: Warm bil, no deformities or joint swelling noted.   Neuro: alert, no focal deficits noted.    Skin: Warm, stasis dermatitis changes in the lower extremities    Lab Results:Reviewed 08/01/2024   CBC    Component Value Date/Time   WBC 6.0 07/19/2024 0615   RBC 4.43 07/19/2024 0615   HGB 9.7 (L) 07/19/2024 0615   HGB 10.4 (L) 01/07/2024 1203   HGB 11.8 05/31/2017 1050   HCT 33.0 (L) 07/19/2024 0615   HCT 35.1 01/07/2024 1203   HCT 37.5 05/31/2017 1050   PLT 159 07/19/2024 0615   PLT 120 (L) 01/07/2024 1203   MCV 74.5 (L) 07/19/2024 0615   MCV 77 (L) 01/07/2024 1203   MCV 71.2 (L) 05/31/2017 1050   MCH 21.9 (L) 07/19/2024 0615   MCHC 29.4 (L) 07/19/2024 0615   RDW 15.4 07/19/2024 0615   RDW 13.3 01/07/2024 1203   RDW 15.1 (H) 05/31/2017 1050   LYMPHSABS 1.4 12/18/2022 0920   LYMPHSABS 1.0 08/29/2019 0941   LYMPHSABS 1.2 05/31/2017 1050   MONOABS 0.5 12/18/2022 0920   MONOABS 0.4 05/31/2017 1050   EOSABS 1.5 (H) 12/18/2022 0920   EOSABS 1.0 (H) 08/29/2019 0941   BASOSABS 0.1 12/18/2022 0920   BASOSABS 0.0 08/29/2019 0941   BASOSABS 0.0 05/31/2017 1050    BMET    Component Value Date/Time   NA 142 07/19/2024 0615   NA 145 (H) 05/08/2024 1359   NA 142 05/31/2017 1050   K 5.1 07/19/2024 0615   K 4.2 05/31/2017 1050   CL 101 07/19/2024 0615   CL 101 09/20/2018 0000   CO2 34 (H) 07/19/2024 0615   CO2 27 09/20/2018 0000   CO2 30 (H) 05/31/2017 1050   GLUCOSE 103 (H) 07/19/2024 0615   GLUCOSE 94 05/31/2017 1050   BUN 31 (H) 07/19/2024 0615   BUN 24 05/08/2024 1359   BUN 26.0 05/31/2017 1050   CREATININE 1.31 (H) 07/19/2024 0615   CREATININE 1.59 (H) 12/18/2022 0920   CREATININE 1.6 (H) 05/31/2017 1050   CALCIUM  8.7 (L)  07/19/2024 0615   CALCIUM  8.2 09/20/2018 0000   CALCIUM  9.0 05/31/2017 1050   GFRNONAA 43 (L) 07/19/2024 0615   GFRNONAA 35 (L) 12/18/2022 0920   GFRNONAA 36 (L) 05/19/2017 1210   GFRAA 20 (L) 04/26/2020 1011   GFRAA 37 (L) 06/23/2018 1300   GFRAA 41 (L) 05/19/2017 1210    BNP    Component Value Date/Time   BNP 58.8 03/01/2024 1434   BNP 65.7 08/31/2014 0937    ProBNP    Component Value Date/Time   PROBNP 665.0 (H) 07/16/2024 1955   PROBNP 494 (H) 04/26/2020 1011   PROBNP 1,193.0 (H) 06/11/2014 1929    Imaging: DG Chest 2 View Result Date: 07/16/2024 EXAM: 2 VIEW(S) XRAY OF THE CHEST 07/16/2024 07:37:00 PM COMPARISON: 04/11/2024 CLINICAL HISTORY: shortness of breath FINDINGS: LUNGS AND PLEURA: There is central peribronchial wall thickening bilaterally with streaky perihilar opacities, right greater than left. No pleural effusion. No pneumothorax. HEART AND MEDIASTINUM: Left chest cardiac loop recorder or leadless pacemaker in place. Aortic arch calcifications. BONES AND SOFT TISSUES: Thoracic spondylosis. IMPRESSION: 1. Central peribronchial wall thickening bilaterally with streaky perihilar opacities pacemaker for infection/inflammation. Electronically signed by: Greig Pique MD 07/16/2024 08:02 PM EST RP Workstation: [id number]    Administration History     None  Latest Ref Rng & Units 05/22/2022    8:13 AM 08/30/2018   10:03 AM 07/19/2014   11:01 AM  PFT Results  FVC-Pre L 1.41  1.92  2.32   FVC-Predicted Pre % 42  69  85   FVC-Post L 1.51  2.13  2.36   FVC-Predicted Post % 46  77  86   Pre FEV1/FVC % % 84  83  71   Post FEV1/FCV % % 85  79  85   FEV1-Pre L 1.18  1.59  1.64   FEV1-Predicted Pre % 47  74  76   FEV1-Post L 1.29  1.69  2.00   DLCO uncorrected ml/min/mmHg 14.60  23.90  18.43   DLCO UNC% % 69  112  71   DLCO corrected ml/min/mmHg 14.60     DLCO COR %Predicted % 69     DLVA Predicted % 111  154  101   TLC L 3.59     TLC % Predicted % 67      RV % Predicted % 66       Lab Results  Component Value Date   NITRICOXIDE 56 08/30/2018        No data to display              Assessment & Plan:   Assessment and Plan Assessment & Plan Chronic Asthmatic Bronchitis flare with possible CAP Recent exacerbation required hospitalization due to dyspnea and cough, likely from environmental changes and secondhand smoke exposure. Completed prednisone  taper today. She is on budesonide  and Brovana  twice daily. Oxygen  saturation is well-managed at 3 liters, with an option to increase to 4 liters if needed. No current smoking history, but significant secondhand smoke exposure. Continue budesonide  and Brovana  twice daily. Maintain oxygen  therapy at 3 liters, increasing to 4 liters if needed. Avoid smoke and environmental triggers. Prone to recurrent flares, discussed beginning Dupixent. Has hx of allergic asthma with elevated eosinophils. She wants to think about it and discuss next month on return ov. Check chest xray on return ov . Going forward consider HRCT chest   Obstructive sleep apnea   Well-controlled with BiPAP ST therapy. Continue BiPAP therapy.  Chronic respiratory failure with hypoxia   Managed with supplemental oxygen . Oxygen  saturation is stable with current therapy. Continue supplemental oxygen  therapy at 3 liters, increasing to 4 liters if needed.  Anemia-continue follow up with PCP   Morbid obesity -ongoing issues with weight loss  Discussed healthy weight management  Diastolic CHF-stable  Continue on current regimen and follow up with cards  Lung cancer history -never smoker s/p SBRT  CT chest 12/2023 stable radiation changes.   Plan  Patient Instructions  Continue on Budesonide  and Brovana  neb Twice daily   Albuterol  inhaler or neb every 6hr as needed.  Singulair  10mg  At bedtime  Zyrtec  10mg  daily  Albuterol  inhaler or neb As needed   Continue on Oxygen  3-4 l/m to keep O2 sats >90%.  Continue on Lasix  40mg   daily  Continue on BIPAP ST machine At bedtime  and with naps.  Follow up next month as planned and As needed            Jacqulynn Shappell, NP 08/01/2024  I spent 36   minutes dedicated to the care of this patient on the date of this encounter to include pre-visit review of records, face-to-face time with the patient discussing conditions above, post visit ordering of testing, clinical documentation with the electronic health record, making appropriate referrals  as documented, and communicating necessary findings to members of the patients care team.      [1]  Allergies Allergen Reactions   Sulfa  Antibiotics Hives and Rash  [2]  Social History Tobacco Use  Smoking Status Never  Smokeless Tobacco Never   "

## 2024-08-01 NOTE — Patient Instructions (Signed)
 Visit Information  Thank you for taking time to visit with me today. Please don't hesitate to contact me if I can be of assistance to you before our next scheduled appointment.  Our next appointment is by telephone on 08/22/2024 at 10:00 am Please call the care guide team at [phone] if you need to cancel or reschedule your appointment.   Following is a copy of your care plan:   Goals Addressed             This Visit's Progress    BSWVBCI Social Work Care Plan       Current SDOH Barriers:  Financial constraints related to high light bill at the home where she lives with her son and patient wants to apply for Medicaid.   Interventions: Referred patient to community resources  SW made a referral to the walk in clinic for Medicaid. Patietn has already applied for Medicaid at DSS.           Please call the Suicide and Crisis Lifeline: 988 go to [facility] [address] [place] [phone]) call 911 if you are experiencing a Mental Health or Behavioral Health Crisis or need someone to talk to.  Patient verbalized understanding of Care plan and visit instructions communicated this visit  Tobias CHARM [name] HEDWIG, PhD [facility], [facility] Social Worker Direct Dial: [phone]  Fax: [phone]

## 2024-08-03 ENCOUNTER — Telehealth: Payer: Self-pay

## 2024-08-03 NOTE — Patient Instructions (Signed)
 Visit Information  Thank you for taking time to visit with me today. Please don't hesitate to contact me if I can be of assistance to you before our next scheduled telephone appointment.  Our next appointment is by telephone on 08/07/24 at 2 pm  Following is a copy of your care plan:   Goals Addressed             This Visit's Progress    VBCI Transitions of Care (TOC) Care Plan       Problems:  Recent Hospitalization for treatment of COPD No Hospital Follow Up Provider appointment patient states she will call and schedule visit and No Specialist appointment patient states she is waiting to hear back from pulmonology office to schedulepatient reports having pulmonology appointment on 08/01/24 and next primary care provider visit is 09/08/24.  Goal:  Over the next 30 days, the patient will not experience hospital readmission  Interventions:  COPD Interventions: Advised patient to self assesses COPD action plan zone and make appointment with provider if in the yellow zone for 48 hours without improvement Use of home oxygen  Confirmed patient using Bipap at hs and is wearing oxygen  at 3 L Assessed for oxygen  saturation Confirmed patient using inhaler and nebulizer as prescribed Take medications as prescribed Confirmed patient contacted by home health.   Patient Self Care Activities:  Attend all scheduled provider appointments Call pharmacy for medication refills 3-7 days in advance of running out of medications Call provider office for new concerns or questions  Notify RN Care Manager of TOC call rescheduling needs Take medications as prescribed   limit outdoor activity during cold weather do breathing exercises every day follow rescue plan if symptoms flare-up keep follow-up appointments: scheduled hospital follow up visit with primary care provider and pulmonologist. Stay hydrated  Plan:  Telephone follow up appointment with care management team member scheduled for:  08/07/24 at  2 pm        Patient verbalizes understanding of instructions and care plan provided today and agrees to view in MyChart. Active MyChart status and patient understanding of how to access instructions and care plan via MyChart confirmed with patient.     The patient has been provided with contact information for the care management team and has been advised to call with any health related questions or concerns.   Please call the care guide team at [phone] if you need to cancel or reschedule your appointment.   Please call the Suicide and Crisis Lifeline: 988 call the [place]  National Suicide Prevention Lifeline: 302-463-1885 or TTY: 321-210-7086 TTY 732 631 2885) to talk to a trained counselor call 1-800-273-TALK (toll free, 24 hour hotline) if you are experiencing a Mental Health or Behavioral Health Crisis or need someone to talk to.  Arvin Seip RN, BSN, CCM [employer], Population Health Case Manager Phone: [phone]

## 2024-08-03 NOTE — Transitions of Care (Post Inpatient/ED Visit) (Signed)
 " Transition of Care week 2  Visit Note  08/03/2024  Name: Pamela [name] MRN: [id number]          DOB: August 21, 1950  Situation: Patient enrolled in [facility] 30-day program. Visit completed with patient by telephone.   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 Patient notes it was consistent with sarcoidosis but actual pathology results not available.   Oxygen  deficiency    Sarcoidosis    Secondary hyperparathyroidism of renal origin 05/08/2024   Sickle cell trait    Sleep apnea     Assessment: Patient Reported Symptoms: Cognitive Cognitive Status: No symptoms reported, Alert and oriented to person, place, and time, Insightful and able to interpret abstract concepts, Normal speech and language skills      Neurological Neurological Review of Symptoms: No symptoms reported    HEENT HEENT Symptoms Reported: No symptoms reported      Cardiovascular Cardiovascular Symptoms Reported: No symptoms reported    Respiratory Respiratory Symptoms Reported: No symptoms reported Additional Respiratory Details: Patient denies any new symptoms. Reports using Bipap at hs and using oxygen  at 3 L.  Patient reports having follow up visit with pulmonologist on 08/01/24 Respiratory Management Strategies: Oxygen  therapy, BiPAP, Routine screening,  Medication therapy, Adequate rest  Endocrine Endocrine Symptoms Reported: No symptoms reported    Gastrointestinal Gastrointestinal Symptoms Reported: No symptoms reported      Genitourinary Genitourinary Symptoms Reported: No symptoms reported, Incontinence Genitourinary Management Strategies: Incontinence garment/pad  Integumentary Integumentary Symptoms Reported: No symptoms reported    Musculoskeletal Musculoskelatal Symptoms Reviewed: Weakness, Unsteady gait Additional Musculoskeletal Details: . Patient reports using cane and/ or walker for ambulation.  Reports having ongoing home health services.        Psychosocial Psychosocial Symptoms Reported: No symptoms reported         There were no vitals filed for this visit. Pain Scale: 0-10 Pain Score: 0-No pain  Medications Reviewed Today     Reviewed by Kearstin Learn E, RN (Registered Nurse) on 08/03/24 at 1337  Med List Status: <None>   Medication Order Taking? Sig Documenting Provider Last Dose Status Informant  acetaminophen  (TYLENOL ) 500 MG tablet [id number] Yes Take 1,000 mg by mouth daily as needed for mild pain (pain score 1-3). [provider]  Active Self, Pharmacy Records           Med Note (CRUTHIS, SHEFFIELD JAYSON Kitchens Aug 17, 2022  7:29 AM)    albuterol  (PROAIR  HFA) 108 [phone] Base) MCG/ACT inhaler [id number] Yes Inhale 2 puffs into the lungs every 4 (four) hours as needed for wheezing or shortness of breath. Paseda, [name], FNP  Active Self, Pharmacy Records  albuterol  (PROVENTIL ) (2.5 MG/3ML) 0.083% nebulizer solution [id number] Yes Take 3 mLs (2.5 mg total) by  nebulization daily as needed for wheezing or shortness of breath. Rizwan, Saima, MD  Active Self, Pharmacy Records  allopurinol  (ZYLOPRIM ) 100 MG tablet [id number] Yes Take 0.5 tablets (50 mg total) by mouth daily. Paseda, [name], FNP  Active Self, Pharmacy Records  arformoterol  (BROVANA ) 15 MCG/2ML NEBU [id number] Yes Take 2 mLs (15 mcg total) by  nebulization 2 (two) times daily. Parrett, Madelin RAMAN, NP  Active Self, Pharmacy Records  aspirin  EC 81 MG tablet [id number] Yes Take 1 tablet (81 mg total) by mouth daily. Swallow whole. Santo Stanly LABOR, MD  Active Self, Pharmacy Records  atorvastatin  (LIPITOR) 40 MG tablet [id number] Yes TAKE 1 TABLET BY MOUTH DAILY Paseda, [name], FNP  Active Self, Pharmacy Records  B Complex-C (B-COMPLEX WITH VITAMIN C) tablet [id number] Yes Take 1 tablet by mouth daily. Tobie Yetta HERO, MD  Active Self, Pharmacy Records           Med Note (CRUTHIS, SHEFFIELD BROCKS   Thu Mar 02, 2024  7:16 AM)    budesonide  (PULMICORT ) 0.25 MG/2ML nebulizer solution [id number] Yes INHALE THE CONTENTS OF 1 VIAL VIA NEBULIZER TWICE DAILY AS DIRECTED WITH ALBUTEROL  [name], Michael B, MD  Active Self, Pharmacy Records  calcitRIOL  (ROCALTROL ) 0.25 MCG capsule [id number] Yes Take 1 capsule (0.25 mcg total) by mouth daily. Paseda, [name], FNP  Active Self, Pharmacy Records  cetirizine  (ZYRTEC ) 10 MG chewable tablet [id number] Yes Chew 10 mg by mouth at bedtime. [provider]  Active Self, Pharmacy Records  diclofenac  Sodium (VOLTAREN ) 1 % GEL [id number] Yes Apply 2 g topically 4 (four) times daily. Tobie Yetta HERO, MD  Active Self, Pharmacy Records           Med Note (HEATER, MEGAN S   Tue Aug 01, 2024 11:40 AM) prn  docusate sodium  (STOOL SOFTENER) 100 MG capsule [id number] Yes Take 100 mg by mouth daily. [provider]  Active Self, Pharmacy Records           Med Note (CRUTHIS, SHEFFIELD BROCKS Kitchens Aug 17, 2022  7:30 AM)    ezetimibe  (ZETIA ) 10 MG tablet [id number] Yes Take 1 tablet (10 mg total) by mouth daily. Paseda, [name], FNP  Active Self, Pharmacy Records  famotidine  (PEPCID ) 20 MG tablet [id number] Yes TAKE 1 TABLET AFTER SUPPER  Patient taking differently: Take 20 mg by mouth at bedtime. TAKE 1 TABLET AFTER SUPPER   Paseda, [name], FNP  Active Self, Pharmacy Records  folic acid  (FOLVITE ) 1 MG tablet  [id number] Yes Take 1 tablet (1 mg total) by mouth daily. Tobie Yetta HERO, MD  Active Self, Pharmacy Records  furosemide  (LASIX ) 40 MG tablet [id number] Yes Take 1 tablet (40 mg total) by mouth daily. Paseda, [name], FNP  Active Self, Pharmacy Records  guaiFENesin  [facility]) 100 MG/5ML liquid [id number] Yes Take 5 mLs by mouth every 4 (four) hours as needed for cough or to loosen phlegm. Jadine Toribio SQUIBB, MD  Active   hydrOXYzine  (ATARAX ) 25 MG tablet [id number] Yes Take 1 tablet (25 mg total) by mouth 3 (three) times daily as needed for itching. Paseda, [name], FNP  Active Self, Pharmacy Records  Incontinence Supply Disposable (DEPEND UNDERWEAR LARGE/XL) MISC [id number] Yes 1 each by Does not apply route 2 times daily at 12 noon and 4 pm. Tilford Bertram HERO, FNP  Active Self, Pharmacy Records  montelukast  (SINGULAIR ) 10 MG tablet [id number] Yes Take 1 tablet (10 mg total) by mouth at bedtime. Paseda, [name], FNP  Active Self, Pharmacy Records  pantoprazole  (PROTONIX ) 40 MG tablet [id number] Yes TAKE 1 TABLET EVERY DAY 30 TO 60 MINUTES BEFORE FIRST MEAL OF THE DAY Paseda, [name], FNP  Active Self, Pharmacy Records  predniSONE  (DELTASONE ) 10 MG tablet [id number] Yes Take 4 tablets (40 mg total) by mouth daily with breakfast for 3 days, THEN 2 tablets (20 mg total) daily with breakfast for 3 days, THEN 1 tablet (10 mg total) daily with breakfast for 3 days. Jadine Toribio SQUIBB, MD  Active             Goals Addressed             This Visit's Progress    VBCI Transitions of Care [facility]) Care Plan       Problems:  Recent Hospitalization for treatment of COPD No Hospital Follow Up Provider appointment patient states she will call and schedule visit and No Specialist appointment patient states she is waiting to hear back from pulmonology office to schedulepatient reports having pulmonology appointment on 08/01/24 and next primary care provider visit is 09/08/24.  Goal:  Over the next 30  days, the patient will not experience hospital readmission  Interventions:  COPD Interventions: Advised patient to self assesses COPD action plan zone and make appointment with provider if in the yellow zone for 48 hours without improvement Use of home oxygen  Confirmed patient using Bipap at hs and is wearing oxygen  at 3 L Assessed for oxygen  saturation Confirmed patient using inhaler and nebulizer as prescribed Take medications as prescribed Confirmed patient contacted by home health.   Patient Self Care Activities:  Attend all scheduled provider appointments Call pharmacy for medication refills 3-7 days in advance of running out of medications Call provider office for new concerns or questions  Notify RN Care Manager of TOC call rescheduling needs Take medications as prescribed   limit outdoor activity during cold weather do breathing exercises every day follow rescue plan if symptoms flare-up keep follow-up appointments: scheduled hospital follow up visit with primary care provider and pulmonologist. Stay hydrated  Plan:  Telephone follow up appointment with care management team member scheduled for:  08/07/24 at 2 pm         Recommendation:   PCP Follow-up Continue Current Plan of Care  Follow Up Plan:   Telephone follow-up in 1 week  Arvin Seip RN, BSN, CCM [place]  [facility], Population Health Case Manager Phone: [phone]     "

## 2024-08-07 ENCOUNTER — Telehealth: Payer: Self-pay

## 2024-08-07 NOTE — Patient Instructions (Signed)
 Visit Information  Thank you for taking time to visit with me today. Please don't hesitate to contact me if I can be of assistance to you before our next scheduled telephone appointment.  Our next appointment is by telephone on 08/15/24 at 2 pm  Following is a copy of your care plan:   Goals Addressed             This Visit's Progress    VBCI Transitions of Care (TOC) Care Plan       Problems:  Recent Hospitalization for treatment of COPD No Hospital Follow Up Provider appointment patient states she will call and schedule visit and No Specialist appointment patient states she is waiting to hear back from pulmonology office to schedulepatient reports having pulmonology appointment on 08/01/24 and next primary care provider visit is 09/08/24.  Goal:  Over the next 30 days, the patient will not experience hospital readmission  Interventions:  COPD Interventions: Advised patient to self assesses COPD action plan zone and make appointment with provider if in the yellow zone for 48 hours without improvement Use of home oxygen - discussed oxygen  safety Assessed for falls.  Reviewed upcoming provider visits.  Reminded patient of upcoming social work visit on 08/23/24.  Confirmed patient using Bipap at hs and is wearing oxygen  at 3 L Assessed for oxygen  saturation and advised to monitor daily Confirmed patient using inhaler and nebulizer as prescribed Take medications as prescribed Medications reviewed and compliance discussed and advised.  Discussed COPD triggers and ways to avoid.  Confirmed patient contacted by home health- patient confirmed that home health PT and OT started week of 07/31/2024 .   Patient Self Care Activities:  Attend all scheduled provider appointments Call pharmacy for medication refills 3-7 days in advance of running out of medications Call provider office for new concerns or questions  Notify RN Care Manager of TOC call rescheduling needs Take medications as  prescribed   limit outdoor activity during cold weather do breathing exercises every day follow rescue plan if symptoms flare-up keep follow-up appointments: scheduled hospital follow up visit with primary care provider and pulmonologist. Stay hydrated Continue to use inhaler and nebulizer as prescribed.  Wear oxygen  as prescribed and practice safety strategies.  Continue to monitor oxygen  saturation daily  Avoid COPD triggers:  smoke, change in weather, smells that bother you.   Plan:  Telephone follow up appointment with care management team member scheduled for:  08/15/24 at 2 pm        Patient verbalizes understanding of instructions and care plan provided today and agrees to view in MyChart. Active MyChart status and patient understanding of how to access instructions and care plan via MyChart confirmed with patient.     The patient has been provided with contact information for the care management team and has been advised to call with any health related questions or concerns.   Please call the care guide team at [phone] if you need to cancel or reschedule your appointment.   Please call the Suicide and Crisis Lifeline: 988 call the [place]  National Suicide Prevention Lifeline: (416)276-5699 or TTY: 718-043-9839 TTY (209)782-2898) to talk to a trained counselor call 1-800-273-TALK (toll free, 24 hour hotline) if you are experiencing a Mental Health or Behavioral Health Crisis or need someone to talk to.  Arvin Seip RN, BSN, CCM [employer], Population Health Case Manager Phone: [phone]

## 2024-08-07 NOTE — Transitions of Care (Post Inpatient/ED Visit) (Signed)
 " Transition of Care week 3  Visit Note  08/07/2024  Name: Pamela [name] MRN: [id number]          DOB: 17-Dec-1950  Situation: Patient enrolled in [facility] 30-day program. Visit completed with patient by telephone.   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 Patient notes it was consistent with sarcoidosis but actual pathology results not available.   Oxygen  deficiency    Sarcoidosis    Secondary hyperparathyroidism of renal origin 05/08/2024   Sickle cell trait    Sleep apnea     Assessment: Patient Reported Symptoms: Cognitive Cognitive Status: Alert and oriented to person, place, and time, Insightful and able to interpret abstract concepts, Normal speech and language skills      Neurological Neurological Review of Symptoms: No symptoms reported    HEENT HEENT Symptoms Reported: No symptoms reported      Cardiovascular Cardiovascular Symptoms Reported: No symptoms reported    Respiratory Respiratory Symptoms Reported: Shortness of breath Additional Respiratory Details: patient states she continues to wear her oxygen  at 2 L 24/7.  She reports most recent oxygen  staturation ranges in the high 90's.  Reports having a follow up visit with her pulmonologist approximately 1 week ago. Denies any changes in medications. States she is  using her inhaler and nebulizer as prescribed.  Denies any new symptoms and/ or concerns. Respiratory Management Strategies: Oxygen  therapy, BiPAP, Routine screening, Medication therapy, Adequate rest  Endocrine Endocrine Symptoms Reported: No symptoms reported    Gastrointestinal Gastrointestinal Symptoms Reported: No symptoms reported      Genitourinary Genitourinary Symptoms Reported: No symptoms reported    Integumentary Integumentary Symptoms Reported: No symptoms reported    Musculoskeletal Musculoskelatal Symptoms Reviewed: Weakness, Unsteady gait Additional Musculoskeletal Details: patient denies any falls. Reports using cane/ walker for ambulation. Musculoskeletal Management Strategies: Medical device      Psychosocial Psychosocial Symptoms Reported: No symptoms reported         There were no vitals filed for this visit. Pain Scale: 0-10 Pain Score: 0-No pain  Medications Reviewed Today     Reviewed by Esteven Overfelt E, RN (Registered Nurse) on 08/07/24 at 1414  Med List Status: <None>   Medication Order Taking? Sig Documenting Provider Last Dose Status Informant  acetaminophen  (TYLENOL ) 500 MG tablet [id number] Yes Take 1,000 mg by mouth daily as needed for mild pain (pain score 1-3). [provider]  Active Self, Pharmacy Records           Med Note (CRUTHIS, SHEFFIELD JAYSON Kitchens Aug 17, 2022  7:29 AM)    albuterol  (PROAIR  HFA) [phone] Base) MCG/ACT inhaler [id number] Yes Inhale 2 puffs into the lungs every 4 (four) hours as needed for wheezing or shortness of breath. Paseda, [name],  FNP  Active Self, Pharmacy Records  albuterol  (PROVENTIL ) (2.5 MG/3ML) 0.083% nebulizer solution [id number] Yes Take 3 mLs (2.5 mg total) by nebulization daily as needed for wheezing or shortness of breath. Rizwan, Saima, MD  Active Self, Pharmacy Records  allopurinol  (ZYLOPRIM ) 100 MG tablet [id number] Yes Take 0.5 tablets (50 mg total) by mouth daily. Paseda, [name], FNP  Active  Self, Pharmacy Records  arformoterol  (BROVANA ) 15 MCG/2ML NEBU [id number] Yes Take 2 mLs (15 mcg total) by nebulization 2 (two) times daily. Parrett, Madelin RAMAN, NP  Active Self, Pharmacy Records  aspirin  EC 81 MG tablet [id number] Yes Take 1 tablet (81 mg total) by mouth daily. Swallow whole. Santo Stanly LABOR, MD  Active Self, Pharmacy Records  atorvastatin  (LIPITOR) 40 MG tablet [id number] Yes TAKE 1 TABLET BY MOUTH DAILY Paseda, [name], FNP  Active Self, Pharmacy Records  B Complex-C (B-COMPLEX WITH VITAMIN C) tablet [id number] Yes Take 1 tablet by mouth daily. Tobie Yetta HERO, MD  Active Self, Pharmacy Records           Med Note (CRUTHIS, SHEFFIELD BROCKS   Thu Mar 02, 2024  7:16 AM)    budesonide  (PULMICORT ) 0.25 MG/2ML nebulizer solution [id number] Yes INHALE THE CONTENTS OF 1 VIAL VIA NEBULIZER TWICE DAILY AS DIRECTED WITH ALBUTEROL  [name], Michael B, MD  Active Self, Pharmacy Records  calcitRIOL  (ROCALTROL ) 0.25 MCG capsule [id number] Yes Take 1 capsule (0.25 mcg total) by mouth daily. Paseda, [name], FNP  Active Self, Pharmacy Records  cetirizine  (ZYRTEC ) 10 MG chewable tablet [id number] Yes Chew 10 mg by mouth at bedtime. [provider]  Active Self, Pharmacy Records  diclofenac  Sodium (VOLTAREN ) 1 % GEL [id number] Yes Apply 2 g topically 4 (four) times daily. Tobie Yetta HERO, MD  Active Self, Pharmacy Records           Med Note (HEATER, MEGAN S   Tue Aug 01, 2024 11:40 AM) prn  docusate sodium  (STOOL SOFTENER) 100 MG capsule [id number] Yes Take 100 mg by mouth daily. [provider]  Active Self, Pharmacy Records           Med Note (CRUTHIS, SHEFFIELD BROCKS Kitchens Aug 17, 2022  7:30 AM)    ezetimibe  (ZETIA ) 10 MG tablet [id number] Yes Take 1 tablet (10 mg total) by mouth daily. Paseda, [name], FNP  Active Self, Pharmacy Records  famotidine  (PEPCID ) 20 MG tablet [id number] Yes TAKE 1 TABLET AFTER SUPPER  Patient taking differently: Take 20 mg by mouth at bedtime. TAKE 1 TABLET  AFTER SUPPER   Paseda, [name], FNP  Active Self, Pharmacy Records  folic acid  (FOLVITE ) 1 MG tablet [id number] Yes Take 1 tablet (1 mg total) by mouth daily. Tobie Yetta HERO, MD  Active Self, Pharmacy Records  furosemide  (LASIX ) 40 MG tablet [id number] Yes Take 1 tablet (40 mg total) by mouth daily. Paseda, [name], FNP  Active Self, Pharmacy Records  guaiFENesin  [facility]) 100 MG/5ML liquid [id number] Yes Take 5 mLs by mouth every 4 (four) hours as needed for cough or to loosen phlegm. Jadine Toribio SQUIBB, MD  Active   hydrOXYzine  (ATARAX ) 25 MG tablet [id number] Yes Take 1 tablet (25 mg total) by mouth 3 (three) times daily as needed for itching. Paseda, [name], FNP  Active Self, Pharmacy Records  Incontinence Supply Disposable (DEPEND UNDERWEAR LARGE/XL) MISC [id number] Yes 1 each by Does not apply route 2 times daily at 12 noon and 4 pm. Tilford Bertram HERO, FNP  Active Self, Pharmacy  Records  montelukast  (SINGULAIR ) 10 MG tablet [id number] Yes Take 1 tablet (10 mg total) by mouth at bedtime. Paseda, [name], FNP  Active Self, Pharmacy Records  pantoprazole  (PROTONIX ) 40 MG tablet [id number] Yes TAKE 1 TABLET EVERY DAY 30 TO 60 MINUTES BEFORE FIRST MEAL OF THE DAY Paseda, [name], FNP  Active Self, Pharmacy Records            Goals Addressed             This Visit's Progress    [facility] Transitions of Care (TOC) Care Plan       Problems:  Recent Hospitalization for treatment of COPD No Hospital Follow Up Provider appointment patient states she will call and schedule visit and No Specialist appointment patient states she is waiting to hear back from pulmonology office to schedulepatient reports having pulmonology appointment on 08/01/24 and next primary care provider visit is 09/08/24.  Goal:  Over the next 30 days, the patient will not experience hospital readmission  Interventions:  COPD Interventions: Advised patient to self assesses COPD action plan zone and make  appointment with provider if in the yellow zone for 48 hours without improvement Use of home oxygen - discussed oxygen  safety Assessed for falls.  Reviewed upcoming provider visits.  Reminded patient of upcoming social work visit on 08/23/24.  Confirmed patient using Bipap at hs and is wearing oxygen  at 3 L Assessed for oxygen  saturation and advised to monitor daily Confirmed patient using inhaler and nebulizer as prescribed Take medications as prescribed Medications reviewed and compliance discussed and advised.  Discussed COPD triggers and ways to avoid.  Confirmed patient contacted by home health- patient confirmed that home health PT and OT started week of 07/31/2024 .   Patient Self Care Activities:  Attend all scheduled provider appointments Call pharmacy for medication refills 3-7 days in advance of running out of medications Call provider office for new concerns or questions  Notify RN Care Manager of TOC call rescheduling needs Take medications as prescribed   limit outdoor activity during cold weather do breathing exercises every day follow rescue plan if symptoms flare-up keep follow-up appointments: scheduled hospital follow up visit with primary care provider and pulmonologist. Stay hydrated Continue to use inhaler and nebulizer as prescribed.  Wear oxygen  as prescribed and practice safety strategies.  Continue to monitor oxygen  saturation daily  Avoid COPD triggers:  smoke, change in weather, smells that bother you.   Plan:  Telephone follow up appointment with care management team member scheduled for:  08/15/24 at 2 pm         Recommendation:   Continue Current Plan of Care  Follow Up Plan:   Telephone follow-up in 1 week  Arvin Seip RN, BSN, CCM [facility], Population Health Case Manager Phone: [phone]     "

## 2024-08-11 ENCOUNTER — Ambulatory Visit: Payer: Self-pay | Admitting: Nurse Practitioner

## 2024-08-11 ENCOUNTER — Encounter: Payer: Self-pay | Admitting: Nurse Practitioner

## 2024-08-11 VITALS — BP 110/51 | HR 70 | Wt 306.0 lb

## 2024-08-11 DIAGNOSIS — R600 Localized edema: Secondary | ICD-10-CM | POA: Diagnosis not present

## 2024-08-11 DIAGNOSIS — E785 Hyperlipidemia, unspecified: Secondary | ICD-10-CM

## 2024-08-11 DIAGNOSIS — J9611 Chronic respiratory failure with hypoxia: Secondary | ICD-10-CM

## 2024-08-11 DIAGNOSIS — J9612 Chronic respiratory failure with hypercapnia: Secondary | ICD-10-CM

## 2024-08-11 DIAGNOSIS — I5032 Chronic diastolic (congestive) heart failure: Secondary | ICD-10-CM

## 2024-08-11 DIAGNOSIS — N1832 Chronic kidney disease, stage 3b: Secondary | ICD-10-CM

## 2024-08-11 DIAGNOSIS — D638 Anemia in other chronic diseases classified elsewhere: Secondary | ICD-10-CM | POA: Diagnosis not present

## 2024-08-11 DIAGNOSIS — J449 Chronic obstructive pulmonary disease, unspecified: Secondary | ICD-10-CM | POA: Diagnosis not present

## 2024-08-11 NOTE — Progress Notes (Signed)
 "  Established Patient Office Visit  Subjective:  Patient ID: Pamela [name], female    DOB: August 15, 1950  Age: 74 y.o. MRN: [id number]  CC:  Chief Complaint  Patient presents with   Hospitalization Follow-up    HPI    Discussed the use of AI scribe software for clinical note transcription with the patient, who gave verbal consent to proceed.  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   who presents for follow-up after a recent hospitalization for COPD exacerbation.  She was hospitalized from July 16, 2024, to July 24, 2024, for a COPD exacerbation. She continues to use three liters of oxygen  and her BiPAP machine at night. She is engaged in physical therapy at home once a week but experiences difficulty with stairs.  She is doing fine generally and is back to her baseline, taking all medications including her inhalers as ordered  . She has a follow-up appointment with her pulmonologist next month and sees her kidney specialist, Dr. Dennise, every six months.  She reports no new symptoms such as increased shortness of breath or bleeding. Her blood count has decreased to 9.7 from previous levels of 10-11.  Taking multiple vitamins  She is up to date on her vaccinations, having received her shingles vaccine last year, and plans to get the COVID vaccine soon. A mammogram is scheduled for next Friday.  Assessment & Plan    .  Past Medical History:  Diagnosis Date   Arthritis    Asthma    Cancer (HCC)    lung, adenocarcinoma right lung 2012   CHF (congestive heart failure) (HCC)     Preserved EF   Congenital single kidney    With chronic kidney disease   COPD (chronic obstructive pulmonary disease) (HCC)    Gout    Hypertension    Lung cancer (HCC) 2012   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 Patient notes it was consistent with sarcoidosis but actual pathology results not available.   Oxygen  deficiency    Sarcoidosis    Secondary hyperparathyroidism of renal origin 05/08/2024   Sickle cell trait    Sleep apnea     Past Surgical History:  Procedure Laterality Date   LOOP RECORDER INSERTION N/A 09/08/2018   Procedure: LOOP RECORDER INSERTION;  Surgeon: Waddell Danelle ORN, MD;  Location: [facility] INVASIVE [facility];  Service: Cardiovascular;  Laterality: N/A;   LUNG BIOPSY     TEE WITHOUT CARDIOVERSION N/A 09/08/2018   Procedure: TRANSESOPHAGEAL ECHOCARDIOGRAM (TEE);  Surgeon: Delford Maude BROCKS, MD;  Location: [facility] ENDOSCOPY;  Service: Cardiovascular;  Laterality: N/A;  with loop   TUBAL LIGATION     VEIN LIGATION AND STRIPPING      Family History  Problem Relation Age of Onset   Hypertension Mother    Renal Disease Mother    High blood pressure Mother    Heart disease Mother    Cancer Father        stomach   Heart disease  Father        No details   Cervical cancer Sister    Diabetes Sister    Multiple myeloma Sister    Cancer Brother    Diabetes Brother     Social History   Socioeconomic History   Marital status: Divorced    Spouse name: Not on file   Number of children: 2   Years of education: Not on file   Highest education level: Not on file  Occupational History   Not on file  Tobacco Use   Smoking status: Never   Smokeless tobacco: Never  Vaping Use   Vaping status: Never Used  Substance and Sexual Activity   Alcohol use: Not on file   Drug use: No   Sexual activity:  Not Currently  Other Topics Concern   Not on file  Social History Narrative   Lives with son.  One son is deceased.     Social Drivers of Health   Tobacco Use: Low Risk (08/11/2024)   Patient History    Smoking Tobacco Use: Never    Smokeless Tobacco Use: Never    Passive Exposure: Not on file  Financial Resource Strain: Medium Risk (02/24/2024)   Overall Financial Resource Strain (CARDIA)    Difficulty of Paying Living Expenses: Somewhat hard  Food Insecurity: Food Insecurity Present (08/11/2024)   Epic    Worried About Programme Researcher, Broadcasting/film/video in the Last Year: Sometimes true    Ran Out of Food in the Last Year: Sometimes true  Transportation Needs: No Transportation Needs (07/27/2024)   Epic    Lack of Transportation (Medical): No    Lack of Transportation (Non-Medical): No  Physical Activity: Insufficiently Active (02/24/2024)   Exercise Vital Sign    Days of Exercise per Week: 7 days    Minutes of Exercise per Session: 20 min  Stress: No Stress Concern Present (02/24/2024)   [employer] of Occupational Health - Occupational Stress Questionnaire    Feeling of Stress: Not at all  Social Connections: Moderately Integrated (07/18/2024)   Social Connection and Isolation Panel    Frequency of Communication with Friends and Family: More than three times a week    Frequency of Social Gatherings with Friends and Family: More than three times a week    Attends Religious Services: More than 4 times per year    Active Member of Clubs or Organizations: Yes    Attends Banker Meetings: More than 4 times per year    Marital Status: Divorced  Intimate Partner Violence: Not At Risk (07/27/2024)   Epic    Fear of Current or Ex-Partner: No    Emotionally Abused: No    Physically Abused: No    Sexually Abused: No  Depression (PHQ2-9): Low Risk (08/11/2024)   Depression (PHQ2-9)    PHQ-2 Score: 0  Alcohol Screen: Low Risk (02/24/2024)   Alcohol Screen    Last Alcohol Screening Score  (AUDIT): 0  Housing: Low Risk (08/11/2024)   Epic    Unable to Pay for Housing in the Last Year: No    Number of Times Moved in the Last Year: 0    Homeless in the Last Year: No  Utilities: Not At Risk (07/27/2024)   Epic    Threatened with loss of utilities: No  Health Literacy: Adequate Health Literacy (07/22/2023)   B1300 Health Literacy    Frequency of need for help with medical instructions: Never    Outpatient Medications Prior to Visit  Medication Sig Dispense Refill   acetaminophen  (TYLENOL ) 500 MG tablet Take 1,000 mg by mouth daily as needed for mild pain (pain score 1-3).     albuterol  (PROAIR  HFA) 108 (90 Base) MCG/ACT inhaler Inhale 2 puffs into the lungs every 4 (four) hours as needed for wheezing or shortness of breath. 8 g 5   albuterol  (PROVENTIL ) (2.5 MG/3ML) 0.083% nebulizer solution Take 3 mLs (2.5 mg total) by nebulization daily as needed for wheezing or shortness of breath. 270 mL 0   allopurinol  (ZYLOPRIM ) 100 MG tablet Take 0.5 tablets (50 mg total) by mouth daily. 60 tablet 2   arformoterol  (BROVANA ) 15 MCG/2ML NEBU Take 2 mLs (15 mcg total) by nebulization 2 (two) times daily. 120 mL 5   aspirin  EC 81 MG tablet Take 1 tablet (81 mg total) by mouth daily. Swallow whole. 30 tablet 11   atorvastatin  (LIPITOR) 40 MG tablet TAKE 1 TABLET BY MOUTH DAILY 90 tablet 3   B Complex-C (B-COMPLEX WITH VITAMIN C) tablet Take 1 tablet by mouth daily. 120 tablet 0   calcitRIOL  (ROCALTROL ) 0.25 MCG capsule Take 1 capsule (0.25 mcg total) by mouth daily. 90 capsule 3   cetirizine  (ZYRTEC ) 10 MG chewable tablet Chew 10 mg by mouth at bedtime.     diclofenac  Sodium (VOLTAREN ) 1 % GEL Apply 2 g topically 4 (four) times daily. 100 g 0   docusate sodium  (STOOL SOFTENER) 100 MG capsule Take 100 mg by mouth daily.     ezetimibe  (ZETIA ) 10 MG tablet Take 1 tablet (10 mg total) by mouth daily. 90 tablet 3   famotidine  (PEPCID ) 20 MG tablet TAKE 1 TABLET AFTER SUPPER (Patient taking  differently: Take 20 mg by mouth at bedtime. TAKE 1 TABLET AFTER SUPPER) 90 tablet 3   furosemide  (LASIX ) 40 MG tablet Take 1 tablet (40 mg total) by mouth daily. 90 tablet 3   guaiFENesin  (ROBITUSSIN) 100 MG/5ML liquid Take 5 mLs by mouth every 4 (four) hours as needed for cough or to loosen phlegm. 118 mL 0   hydrOXYzine  (ATARAX ) 25 MG tablet Take 1 tablet (25 mg total) by mouth 3 (three) times daily as needed for itching. 30 tablet 0   Incontinence Supply Disposable (DEPEND UNDERWEAR LARGE/XL) MISC 1 each by Does not apply route 2 times daily at 12 noon and 4 pm. 17 each 12   montelukast  (SINGULAIR ) 10 MG tablet Take 1 tablet (10 mg total) by mouth at bedtime. 90 tablet 3   pantoprazole  (PROTONIX ) 40 MG tablet TAKE 1 TABLET EVERY DAY 30 TO 60 MINUTES BEFORE FIRST MEAL OF THE DAY 90 tablet 3   budesonide  (PULMICORT ) 0.25 MG/2ML nebulizer solution INHALE THE CONTENTS OF 1 VIAL VIA NEBULIZER TWICE DAILY AS DIRECTED WITH ALBUTEROL  360 mL 3   folic acid  (FOLVITE ) 1 MG tablet Take 1 tablet (1 mg total) by mouth daily. 120 tablet 0   No facility-administered medications prior to visit.    Allergies[1]  ROS Review of Systems  Constitutional:  Negative for appetite change, chills, fatigue and fever.  HENT:  Negative for congestion, postnasal drip, rhinorrhea and sneezing.   Respiratory:  Negative for cough, shortness of breath and wheezing.   Cardiovascular:  Negative for chest pain, palpitations and leg swelling.  Gastrointestinal:  Negative for abdominal pain, constipation, nausea and vomiting.  Genitourinary:  Negative for difficulty urinating, dysuria, flank pain and frequency.  Musculoskeletal:  Negative for arthralgias, back pain, joint swelling and myalgias.  Skin:  Negative for color change,  pallor, rash and wound.  Neurological:  Negative for dizziness, facial asymmetry, weakness, numbness and headaches.  Psychiatric/Behavioral:  Negative for behavioral problems, confusion, self-injury  and suicidal ideas.       Objective:    Physical Exam Vitals and nursing note reviewed.  Constitutional:      General: She is not in acute distress.    Appearance: Normal appearance. She is obese. She is not ill-appearing, toxic-appearing or diaphoretic.  Eyes:     General: No scleral icterus.       Right eye: No discharge.        Left eye: No discharge.     Extraocular Movements: Extraocular movements intact.     Conjunctiva/sclera: Conjunctivae normal.  Cardiovascular:     Rate and Rhythm: Normal rate and regular rhythm.     Pulses: Normal pulses.     Heart sounds: Normal heart sounds. No murmur heard.    No friction rub. No gallop.  Pulmonary:     Effort: Pulmonary effort is normal. No respiratory distress.     Breath sounds: Normal breath sounds. No stridor. No wheezing, rhonchi or rales.  Chest:     Chest wall: No tenderness.  Abdominal:     General: There is no distension.     Palpations: Abdomen is soft.     Tenderness: There is no abdominal tenderness. There is no right CVA tenderness, left CVA tenderness or guarding.  Musculoskeletal:        General: No swelling, tenderness, deformity or signs of injury.     Right lower leg: Edema present.     Left lower leg: Edema present.     Comments: Non pitting edema  Skin:    General: Skin is warm and dry.     Capillary Refill: Capillary refill takes less than 2 seconds.     Coloration: Skin is not jaundiced or pale.     Findings: No bruising, erythema or lesion.  Neurological:     Mental Status: She is alert and oriented to person, place, and time.     Motor: No weakness.     Coordination: Coordination normal.     Gait: Gait normal.  Psychiatric:        Mood and Affect: Mood normal.        Behavior: Behavior normal.        Thought Content: Thought content normal.        Judgment: Judgment normal.     BP (!) 110/51   Pulse 70   Wt (!) 306 lb (138.8 kg)   SpO2 100%   BMI 52.52 kg/m  Wt Readings from Last 3  Encounters:  08/11/24 (!) 306 lb (138.8 kg)  08/01/24 (!) 301 lb 12.8 oz (136.9 kg)  07/16/24 295 lb (133.8 kg)    Lab Results  Component Value Date   TSH 2.810 09/24/2022   Lab Results  Component Value Date   WBC 6.0 07/19/2024   HGB 9.7 (L) 07/19/2024   HCT 33.0 (L) 07/19/2024   MCV 74.5 (L) 07/19/2024   PLT 159 07/19/2024   Lab Results  Component Value Date   NA 142 07/19/2024   K 5.1 07/19/2024   CHLORIDE 103 05/31/2017   CO2 34 (H) 07/19/2024   GLUCOSE 103 (H) 07/19/2024   BUN 31 (H) 07/19/2024   CREATININE 1.31 (H) 07/19/2024   BILITOT 0.2 07/17/2024   ALKPHOS 152 (H) 07/17/2024   AST 32 07/17/2024   ALT 30 07/17/2024   PROT 7.2 07/17/2024  ALBUMIN 3.9 07/17/2024   CALCIUM  8.7 (L) 07/19/2024   ANIONGAP 6 07/19/2024   EGFR 40 (L) 05/08/2024   GFR 29.92 (L) 08/27/2022   Lab Results  Component Value Date   CHOL 113 06/30/2023   Lab Results  Component Value Date   HDL 52 06/30/2023   Lab Results  Component Value Date   LDLCALC 46 06/30/2023   Lab Results  Component Value Date   TRIG 70 06/30/2023   Lab Results  Component Value Date   CHOLHDL 2.2 06/30/2023   Lab Results  Component Value Date   HGBA1C 5.3 05/08/2024      Assessment & Plan:   Problem List Items Addressed This Visit       Cardiovascular and Mediastinum   Chronic heart failure with preserved ejection fraction (HFpEF) (HCC) - Primary   Continue furosemide  40 mg daily Provided cardiology contact information, she is past due for follow-up        Respiratory   Chronic respiratory failure with hypoxia and hypercapnia (HCC)   Chronic respiratory failure with hypoxia and hypercapnia Continues on 3 liters of oxygen  and BiPAP at night. Recent hospitalization for COPD exacerbation. Follow-up with pulmonologist scheduled. - Continue 3 liters of oxygen  and BiPAP at night. - Follow up with pulmonologist next month.       COPD (chronic obstructive pulmonary disease) (HCC)    Continue Brovana  nebulizer 15 mcg twice daily, albuterol  inhaler 2 puffs every 4 hours as needed, albuterol  nebulizer 2.5 mg daily as needed, Pulmicort  nebulizer 0.25 mg twice daily with albuterol  Maintain close follow-up with pulmonologist        Genitourinary   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)   Lab Results  Component Value Date   NA 142 07/19/2024   K 5.1 07/19/2024   CO2 34 (H) 07/19/2024   GLUCOSE 103 (H) 07/19/2024   BUN 31 (H) 07/19/2024   CREATININE 1.31 (H) 07/19/2024   CALCIUM  8.7 (L) 07/19/2024   GFR 29.92 (L) 08/27/2022   EGFR 40 (L) 05/08/2024   GFRNONAA 43 (L) 07/19/2024  Avoid NSAIDs and other nephrotoxic agents Drink at least 64 ounces of water daily to maintain hydration Maintain close follow-up with nephrologist        Other   Lower extremity edema   On furosemide  40 mg daily Continue current medication      Anemia of chronic disease   Lab Results  Component Value Date   WBC 6.0 07/19/2024   HGB 9.7 (L) 07/19/2024   HCT 33.0 (L) 07/19/2024   MCV 74.5 (L) 07/19/2024   PLT 159 07/19/2024    Lab Results  Component Value Date   IRON  73 01/07/2024   TIBC 202 (L) 01/07/2024   FERRITIN 542 (H) 01/07/2024    On multiple vitamins not on ESA Rechecking CBC      Relevant Orders   CBC   Iron , TIBC and Ferritin Panel   Dyslipidemia   Lab Results  Component Value Date   CHOL 113 06/30/2023   HDL 52 06/30/2023   LDLCALC 46 06/30/2023   TRIG 70 06/30/2023   CHOLHDL 2.2 06/30/2023  Come fasting to next appointment for labs Continue Zetia  10 mg daily, atorvastatin  40 mg daily       No orders of the defined types were placed in this encounter.   Follow-up: Return in about 4 months (around 12/09/2024) for CPE.    Javid Kemler R Salim Forero, FNP     [1]  Allergies Allergen Reactions  Sulfa  Antibiotics Hives and Rash   "

## 2024-08-11 NOTE — Assessment & Plan Note (Signed)
 On furosemide  40 mg daily Continue current medication

## 2024-08-11 NOTE — Assessment & Plan Note (Signed)
 Lab Results  Component Value Date   NA 142 07/19/2024   K 5.1 07/19/2024   CO2 34 (H) 07/19/2024   GLUCOSE 103 (H) 07/19/2024   BUN 31 (H) 07/19/2024   CREATININE 1.31 (H) 07/19/2024   CALCIUM  8.7 (L) 07/19/2024   GFR 29.92 (L) 08/27/2022   EGFR 40 (L) 05/08/2024   GFRNONAA 43 (L) 07/19/2024  Avoid NSAIDs and other nephrotoxic agents Drink at least 64 ounces of water daily to maintain hydration Maintain close follow-up with nephrologist

## 2024-08-11 NOTE — Assessment & Plan Note (Signed)
 Lab Results  Component Value Date   WBC 6.0 07/19/2024   HGB 9.7 (L) 07/19/2024   HCT 33.0 (L) 07/19/2024   MCV 74.5 (L) 07/19/2024   PLT 159 07/19/2024    Lab Results  Component Value Date   IRON  73 01/07/2024   TIBC 202 (L) 01/07/2024   FERRITIN 542 (H) 01/07/2024    On multiple vitamins not on ESA Rechecking CBC

## 2024-08-11 NOTE — Assessment & Plan Note (Signed)
 Continue furosemide  40 mg daily Provided cardiology contact information, she is past due for follow-up

## 2024-08-11 NOTE — Assessment & Plan Note (Signed)
 Continue Brovana  nebulizer 15 mcg twice daily, albuterol  inhaler 2 puffs every 4 hours as needed, albuterol  nebulizer 2.5 mg daily as needed, Pulmicort  nebulizer 0.25 mg twice daily with albuterol  Maintain close follow-up with pulmonologist

## 2024-08-11 NOTE — Patient Instructions (Addendum)
 Cardiology [address], [place], [place] [address]      Phone: [phone]  It is important that you exercise regularly at least 30 minutes 5 times a week as tolerated  Think about what you will eat, plan ahead. Choose  clean, green, fresh or frozen over canned, processed or packaged foods which are more sugary, salty and fatty. 70 to 75% of food eaten should be vegetables and fruit. Three meals at set times with snacks allowed between meals, but they must be fruit or vegetables. Aim to eat over a 12 hour period , example 7 am to 7 pm, and STOP after  your last meal of the day. Drink water,generally about 64 ounces per day, no other drink is as healthy. Fruit juice is best enjoyed in a healthy way, by EATING the fruit.  Thanks for choosing [facility] we consider it a privelige to serve you.

## 2024-08-11 NOTE — Assessment & Plan Note (Signed)
 Lab Results  Component Value Date   CHOL 113 06/30/2023   HDL 52 06/30/2023   LDLCALC 46 06/30/2023   TRIG 70 06/30/2023   CHOLHDL 2.2 06/30/2023  Come fasting to next appointment for labs Continue Zetia  10 mg daily, atorvastatin  40 mg daily

## 2024-08-11 NOTE — Assessment & Plan Note (Signed)
 Chronic respiratory failure with hypoxia and hypercapnia Continues on 3 liters of oxygen  and BiPAP at night. Recent hospitalization for COPD exacerbation. Follow-up with pulmonologist scheduled. - Continue 3 liters of oxygen  and BiPAP at night. - Follow up with pulmonologist next month.

## 2024-08-12 LAB — CBC
Hematocrit: 33.2 % — ABNORMAL LOW (ref 34.0–46.6)
Hemoglobin: 10 g/dL — ABNORMAL LOW (ref 11.1–15.9)
MCH: 22.5 pg — ABNORMAL LOW (ref 26.6–33.0)
MCHC: 30.1 g/dL — ABNORMAL LOW (ref 31.5–35.7)
MCV: 75 fL — ABNORMAL LOW (ref 79–97)
Platelets: 121 10*3/uL — ABNORMAL LOW (ref 150–450)
RBC: 4.44 x10E6/uL (ref 3.77–5.28)
RDW: 15.4 % (ref 11.7–15.4)
WBC: 4.4 10*3/uL (ref 3.4–10.8)

## 2024-08-12 LAB — IRON,TIBC AND FERRITIN PANEL
Ferritin: 512 ng/mL — ABNORMAL HIGH (ref 15–150)
Iron Saturation: 29 % (ref 15–55)
Iron: 60 ug/dL (ref 27–139)
Total Iron Binding Capacity: 209 ug/dL — ABNORMAL LOW (ref 250–450)
UIBC: 149 ug/dL (ref 118–369)

## 2024-08-14 ENCOUNTER — Ambulatory Visit: Payer: Self-pay | Admitting: Nurse Practitioner

## 2024-08-15 ENCOUNTER — Telehealth: Payer: Self-pay

## 2024-08-15 NOTE — Patient Instructions (Signed)
 Visit Information  Thank you for taking time to visit with me today. Please don't hesitate to contact me if I can be of assistance to you before our next scheduled telephone appointment.  Our next appointment is by telephone on 08/23/24 at 3 pm  Following is a copy of your care plan:   Goals Addressed             This Visit's Progress    [facility] Transitions of Care (TOC) Care Plan       Problems:  Recent Hospitalization for treatment of COPD No Hospital Follow Up Provider appointment patient states she will call and schedule visit and No Specialist appointment patient states she is waiting to hear back from pulmonology office to schedulepatient reports having pulmonology appointment on 08/01/24 and next primary care provider visit is 09/08/24. Patient reports having hospital follow up appointment with her primary care provider on 08/11/24 and follow up visit with the pulmonologist on 09/11/24.   Goal:  Over the next 30 days, the patient will not experience hospital readmission  Interventions:  COPD Interventions: Advised patient to self assesses COPD action plan zone and make appointment with provider if in the yellow zone for 48 hours without improvement Use of home oxygen - discussed ongoing oxygen  safety Assessed for falls.  Reviewed upcoming provider visits.  Reminded patient of upcoming social work visit on 08/23/24.  Discussed primary care hospital follow up visit from 08/11/24.  Advised patient to call and schedule a follow up visit with her cardiologist due to being overdue as recommended by her primary care provider.  Confirmed patient using Bipap at hs and is wearing oxygen  at 3 L Assessed for oxygen  saturation and advised to monitor daily Confirmed patient using inhaler and nebulizer as prescribed Medications reviewed and compliance discussed and advised.  Discussed COPD triggers and ways to avoid especially during extremely cold temperatures.  Assessed for ongoing home health PT/ OT    Patient Self Care Activities:  Attend all scheduled provider appointments Call pharmacy for medication refills 3-7 days in advance of running out of medications Call provider office for new concerns or questions  Notify RN Care Manager of TOC call rescheduling needs Take medications as prescribed   limit outdoor activity during cold weather follow rescue plan if symptoms flare-up Stay hydrated Continue to use inhaler and nebulizer as prescribed.  Wear oxygen  as prescribed and practice safety strategies.  Continue to monitor oxygen  saturation daily  Avoid COPD triggers:  smoke, change in weather, smells that bother you.  Call and schedule cardiology follow up visit.   Plan:  Telephone follow up appointment with care management team member scheduled for:  08/23/24 at 3 pm        Patient verbalizes understanding of instructions and care plan provided today and agrees to view in MyChart. Active MyChart status and patient understanding of how to access instructions and care plan via MyChart confirmed with patient.     The patient has been provided with contact information for the care management team and has been advised to call with any health related questions or concerns.   Please call the care guide team at [phone] if you need to cancel or reschedule your appointment.   Please call the Suicide and Crisis Lifeline: 988 call the [place]  National Suicide Prevention Lifeline: 782-356-9741 or TTY: 314-758-1193 TTY 367-491-0645) to talk to a trained counselor call 1-800-273-TALK (toll free, 24 hour hotline) if you are experiencing a Mental Health or Behavioral Health Crisis or need  someone to talk to.  Arvin Seip RN, BSN, CCM [employer], Population Health Case Manager Phone: [phone]

## 2024-08-15 NOTE — Transitions of Care (Post Inpatient/ED Visit) (Signed)
 " Transition of Care week 4  Visit Note  08/15/2024  Name: Pamela [name] MRN: [id number]          DOB: [birth year]  Situation: Patient enrolled in [facility] 30-day program. Visit completed with patient by telephone.   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 Patient notes it was consistent with sarcoidosis but actual pathology results not available.   Oxygen  deficiency    Sarcoidosis    Secondary hyperparathyroidism of renal origin 05/08/2024   Sickle cell trait    Sleep apnea     Assessment: Patient Reported Symptoms: Cognitive Cognitive Status: No symptoms reported, Alert and oriented to person, place, and time, Insightful and able to interpret abstract concepts, Normal speech and language skills      Neurological Neurological Review of Symptoms: No symptoms reported    HEENT HEENT Symptoms Reported: No symptoms reported      Cardiovascular Cardiovascular Symptoms Reported: No symptoms reported    Respiratory Respiratory Symptoms Reported: Shortness of breath Additional Respiratory Details: Patient states she is at baseline regarding her breathing and continues to wear her oxygen  at 3 L, bypap at HS and uses her nebulizer and inhaler as needed. Patient states she is scheduled to see the pulmonologist on 09/11/24. Respiratory  Management Strategies: Oxygen  therapy, BiPAP, Adequate rest, Medication therapy, Routine screening  Endocrine Endocrine Symptoms Reported: No symptoms reported    Gastrointestinal Gastrointestinal Symptoms Reported: No symptoms reported      Genitourinary Genitourinary Symptoms Reported: No symptoms reported    Integumentary Integumentary Symptoms Reported: No symptoms reported    Musculoskeletal Musculoskelatal Symptoms Reviewed: Difficulty walking Additional Musculoskeletal Details: reports using cane/ walker for ambulation Musculoskeletal Management Strategies: Medical device      Psychosocial Psychosocial Symptoms Reported: No symptoms reported         There were no vitals filed for this visit. Pain Scale: 0-10 Pain Score: 0-No pain  Medications Reviewed Today     Reviewed by Angelyne Terwilliger E, RN (Registered Nurse) on 08/15/24 at 1458  Med List Status: <None>   Medication Order Taking? Sig Documenting Provider Last Dose Status Informant  acetaminophen  (TYLENOL ) 500 MG tablet [id number] Yes Take 1,000 mg by mouth daily as needed for mild pain (pain score 1-3). [provider]  Active Self, Pharmacy Records           Med Note (CRUTHIS, SHEFFIELD JAYSON Kitchens Aug 17, 2022  7:29 AM)    albuterol  (PROAIR  HFA) 108 [phone] Base) MCG/ACT inhaler [id number] Yes Inhale 2 puffs into the lungs every 4 (four) hours as needed for wheezing or shortness of breath. Paseda, [name], FNP  Active Self, Pharmacy Records  albuterol  (PROVENTIL ) (2.5 MG/3ML) 0.083% nebulizer solution [id number] Yes Take 3 mLs (2.5 mg total) by  nebulization daily as needed for wheezing or shortness of breath. Rizwan, Saima, MD  Active Self, Pharmacy Records  allopurinol  (ZYLOPRIM ) 100 MG tablet [id number] Yes Take 0.5 tablets (50 mg total) by mouth daily. Paseda, [name], FNP  Active Self, Pharmacy Records  arformoterol  (BROVANA ) 15 MCG/2ML NEBU [id number] Yes Take 2 mLs (15 mcg total) by nebulization 2 (two) times  daily. Parrett, Madelin RAMAN, NP  Active Self, Pharmacy Records  aspirin  EC 81 MG tablet [id number] Yes Take 1 tablet (81 mg total) by mouth daily. Swallow whole. Santo Stanly LABOR, MD  Active Self, Pharmacy Records  atorvastatin  (LIPITOR) 40 MG tablet [id number] Yes TAKE 1 TABLET BY MOUTH DAILY Paseda, [name], FNP  Active Self, Pharmacy Records  B Complex-C (B-COMPLEX WITH VITAMIN C) tablet [id number] Yes Take 1 tablet by mouth daily. Tobie Yetta HERO, MD  Active Self, Pharmacy Records           Med Note (CRUTHIS, CHLOE C   Thu Mar 02, 2024  7:16 AM)    budesonide  (PULMICORT ) 0.25 MG/2ML nebulizer solution [id number] Yes INHALE THE CONTENTS OF 1 VIAL VIA NEBULIZER TWICE DAILY AS DIRECTED WITH ALBUTEROL  [name], Michael B, MD  Active Self, Pharmacy Records  calcitRIOL  (ROCALTROL ) 0.25 MCG capsule [id number] Yes Take 1 capsule (0.25 mcg total) by mouth daily. Paseda, [name], FNP  Active Self, Pharmacy Records  cetirizine  (ZYRTEC ) 10 MG chewable tablet [id number] Yes Chew 10 mg by mouth at bedtime. [provider]  Active Self, Pharmacy Records  diclofenac  Sodium (VOLTAREN ) 1 % GEL [id number] Yes Apply 2 g topically 4 (four) times daily. Tobie Yetta HERO, MD  Active Self, Pharmacy Records           Med Note (HEATER, MEGAN S   Tue Aug 01, 2024 11:40 AM) prn  docusate sodium  (STOOL SOFTENER) 100 MG capsule [id number] Yes Take 100 mg by mouth daily. [provider]  Active Self, Pharmacy Records           Med Note (CRUTHIS, SHEFFIELD JAYSON Kitchens Aug 17, 2022  7:30 AM)    ezetimibe  (ZETIA ) 10 MG tablet [id number] Yes Take 1 tablet (10 mg total) by mouth daily. Paseda, [name], FNP  Active Self, Pharmacy Records  famotidine  (PEPCID ) 20 MG tablet [id number] Yes TAKE 1 TABLET AFTER SUPPER  Patient taking differently: Take 20 mg by mouth at bedtime. TAKE 1 TABLET AFTER SUPPER   Paseda, [name], FNP  Active Self, Pharmacy Records  folic acid  (FOLVITE ) 1 MG tablet [id number] Yes Take 1 tablet (1  mg total) by mouth daily. Tobie Yetta HERO, MD  Active Self, Pharmacy Records  furosemide  (LASIX ) 40 MG tablet [id number] Yes Take 1 tablet (40 mg total) by mouth daily. Paseda, [name], FNP  Active Self, Pharmacy Records  guaiFENesin  (ROBITUSSIN) 100 MG/5ML liquid [id number] Yes Take 5 mLs by mouth every 4 (four) hours as needed for cough or to loosen phlegm. Jadine Toribio SQUIBB, MD  Active   hydrOXYzine  (ATARAX ) 25 MG tablet [id number] Yes Take 1 tablet (25 mg total) by mouth 3 (three) times daily as needed for itching. Paseda, [name], FNP  Active Self, Pharmacy Records  Incontinence Supply Disposable (DEPEND UNDERWEAR LARGE/XL) MISC [id number] Yes 1 each by Does not apply route 2 times daily at 12 noon and 4 pm. Tilford Bertram HERO, FNP  Active Self, Pharmacy Records  montelukast  (SINGULAIR ) 10 MG tablet [id number] Yes Take 1 tablet (10 mg total) by mouth at bedtime. Paseda, [name], FNP  Active Self, Pharmacy Records  pantoprazole  (PROTONIX ) 40 MG tablet [id number] Yes TAKE 1 TABLET EVERY DAY 30 TO 60 MINUTES BEFORE FIRST MEAL OF THE DAY Paseda, [name], FNP  Active Self, Pharmacy Records            Goals Addressed             This Visit's Progress    VBCI Transitions of Care (TOC) Care Plan       Problems:  Recent Hospitalization for treatment of COPD No Hospital Follow Up Provider appointment patient states she will call and schedule visit and No Specialist appointment patient states she is waiting to hear back from pulmonology office to schedulepatient reports having pulmonology appointment on 08/01/24 and next primary care provider visit is 09/08/24. Patient reports having hospital follow up appointment with her primary care provider on 08/11/24 and follow up visit with the pulmonologist on 09/11/24.   Goal:  Over the next 30 days, the patient will not experience hospital readmission  Interventions:  COPD Interventions: Advised patient to self assesses COPD action plan zone  and make appointment with provider if in the yellow zone for 48 hours without improvement Use of home oxygen - discussed ongoing oxygen  safety Assessed for falls.  Reviewed upcoming provider visits.  Reminded patient of upcoming social work visit on 08/23/24.  Discussed primary care hospital follow up visit from 08/11/24.  Advised patient to call and schedule a follow up visit with her cardiologist due to being overdue as recommended by her primary care provider.  Confirmed patient using Bipap at hs and is wearing oxygen  at 3 L Assessed for oxygen  saturation and advised to monitor daily Confirmed patient using inhaler and nebulizer as prescribed Medications reviewed and compliance discussed and advised.  Discussed COPD triggers and ways to avoid especially during extremely cold temperatures.  Assessed for ongoing home health PT/ OT   Patient Self Care Activities:  Attend all scheduled provider appointments Call pharmacy for medication refills 3-7 days in advance of running out of medications Call provider office for new concerns or questions  Notify RN Care Manager of TOC call rescheduling needs Take medications as prescribed   limit outdoor activity during cold weather follow rescue plan if symptoms flare-up Stay hydrated Continue to use inhaler and nebulizer as prescribed.  Wear oxygen  as prescribed and practice safety strategies.  Continue to monitor oxygen  saturation daily  Avoid COPD triggers:  smoke, change in weather, smells that bother you.  Call and schedule cardiology follow up visit.   Plan:  Telephone follow up appointment with care management team member scheduled for:  08/23/24 at 3 pm        Recommendation:   Continue Current Plan of Care Advised to call cardiology to schedule overdue follow up appointment as recommended by primary care provider.  Follow Up Plan:   Telephone follow-up in 1 week  Arvin Seip RN, BSN, CCM [place]  [facility],  Population Health Case Manager Phone: [phone]     "

## 2024-08-18 ENCOUNTER — Ambulatory Visit
Admission: RE | Admit: 2024-08-18 | Discharge: 2024-08-18 | Disposition: A | Source: Ambulatory Visit | Attending: Nurse Practitioner

## 2024-08-18 DIAGNOSIS — Z1231 Encounter for screening mammogram for malignant neoplasm of breast: Secondary | ICD-10-CM

## 2024-08-22 ENCOUNTER — Other Ambulatory Visit: Admitting: Licensed Clinical Social Worker

## 2024-08-23 ENCOUNTER — Ambulatory Visit: Payer: Self-pay | Admitting: Nurse Practitioner

## 2024-08-23 ENCOUNTER — Telehealth: Payer: Self-pay

## 2024-08-23 ENCOUNTER — Other Ambulatory Visit: Payer: Self-pay | Admitting: Licensed Clinical Social Worker

## 2024-08-23 NOTE — Patient Instructions (Signed)
 Visit Information  Thank you for taking time to visit with me today. Please don't hesitate to contact me if I can be of assistance to you before our next scheduled appointment.  Your next care management appointment is by telephone on 09/06/2024 at 10:00 am   Please call the care guide team at [phone] if you need to cancel, schedule, or reschedule an appointment.   Please call the Suicide and Crisis Lifeline: 988 go to [facility] Urgent [facility] [address], [place] [phone]) call 911 if you are experiencing a Mental Health or Behavioral Health Crisis or need someone to talk to.  Tobias CHARM [name] HEDWIG, PhD [facility], [facility] Social Worker Direct Dial: [phone]  Fax: [phone]

## 2024-08-23 NOTE — Patient Instructions (Signed)
 Visit Information  Thank you for taking time to visit with me today. You have completed the 30 day TOC program and met your program goals. Please contact your primary care provider if you have any further needs or concerns.   Following is a copy of your care plan:   Goals Addressed             This Visit's Progress    VBCI Transitions of Care (TOC) Care Plan       Problems:  Recent Hospitalization for treatment of COPD No Hospital Follow Up Provider appointment patient states she will call and schedule visit and No Specialist appointment patient states she is waiting to hear back from pulmonology office to schedule Patient reports having hospital follow up appointment with her primary care provider on 08/11/24 and follow up visit with the pulmonologist on 09/11/24.   Goal:  Over the next 30 days, the patient will not experience hospital readmission  Interventions:  COPD Interventions: Advised patient to self assesses COPD action plan zone and make appointment with provider if in the yellow zone for 48 hours without improvement Use of home oxygen - discussed ongoing oxygen  safety Reviewed upcoming provider visits.  Reminded patient of upcoming social work visit on 08/23/24.  Confirmed patient using Bipap at hs and is wearing oxygen  at 3 L Assessed for oxygen  saturation and advised to monitor daily Confirmed patient using inhaler and nebulizer as prescribed Medications reviewed and compliance discussed and advised.  Discussed COPD triggers and ways to avoid especially during extremely cold temperatures.  Assessed for any new/ ongoing symptoms.   Patient Self Care Activities:  Attend all scheduled provider appointments Call pharmacy for medication refills 3-7 days in advance of running out of medications Call provider office for new concerns or questions  Take medications as prescribed   limit outdoor activity during cold weather follow rescue plan if symptoms flare-up Stay  hydrated Continue to use inhaler and nebulizer as prescribed.  Wear oxygen  as prescribed and practice safety strategies.  Continue to monitor oxygen  saturation daily  Avoid COPD triggers:  smoke, change in weather, smells that bother you.   Plan:  No further follow up required: patient completed 30 day TOC program and has met program goals.         Patient verbalizes understanding of instructions and care plan provided today and agrees to view in MyChart. Active MyChart status and patient understanding of how to access instructions and care plan via MyChart confirmed with patient.     The patient has been provided with contact information for the care management team and has been advised to call with any health related questions or concerns.   Please call the care guide team at [phone] if you need to cancel or reschedule your appointment.   Please call the Suicide and Crisis Lifeline: 988 call the [place]  National Suicide Prevention Lifeline: 289-211-8830 or TTY: 951 020 4743 TTY (423)417-4041) to talk to a trained counselor call 1-800-273-TALK (toll free, 24 hour hotline) if you are experiencing a Mental Health or Behavioral Health Crisis or need someone to talk to.  Arvin Seip RN, BSN, CCM [employer], Population Health Case Manager Phone: [phone]

## 2024-08-23 NOTE — Transitions of Care (Post Inpatient/ED Visit) (Signed)
 " Transition of Care week #5  Visit Note  08/23/2024  Name: Pamela [name] MRN: [id number]          DOB: 1951-02-18  Situation: Patient enrolled in [facility] 30-day program. Visit completed with patient by telephone.   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 Patient notes it was consistent with sarcoidosis but actual pathology results not available.   Oxygen  deficiency    Sarcoidosis    Secondary hyperparathyroidism of renal origin 05/08/2024   Sickle cell trait    Sleep apnea     Assessment: Patient Reported Symptoms: Cognitive Cognitive Status: No symptoms reported, Alert and oriented to person, place, and time, Insightful and able to interpret abstract concepts, Normal speech and language skills      Neurological Neurological Review of Symptoms: No symptoms reported    HEENT HEENT Symptoms Reported: No symptoms reported      Cardiovascular Cardiovascular Symptoms Reported: No symptoms reported    Respiratory Respiratory Symptoms Reported: Shortness of breath Additional Respiratory Details: patient states she is at her baseline regarding her breathing. She reports wearing her oxygen  at 3 L and uses her bypap at night. She states she continues to use her nebulizer and inhaler as needed. Per chart review her next pulmonary  appointment is 09/11/24. Respiratory Management Strategies: Oxygen  therapy, BiPAP, Medication therapy, Routine screening, Adequate rest (nebulizer)  Endocrine Endocrine Symptoms Reported: No symptoms reported    Gastrointestinal Gastrointestinal Symptoms Reported: No symptoms reported      Genitourinary Genitourinary Symptoms Reported: No symptoms reported    Integumentary Integumentary Symptoms Reported: No symptoms reported    Musculoskeletal Musculoskelatal Symptoms Reviewed: Difficulty walking Additional Musculoskeletal Details: denies any additional symptoms. Reports using walker / cane for ambulation Musculoskeletal Management Strategies: Medical device      Psychosocial Psychosocial Symptoms Reported: No symptoms reported         There were no vitals filed for this visit. Pain Scale: 0-10 Pain Score: 0-No pain  Medications Reviewed Today     Reviewed by Trafton Roker E, RN (Registered Nurse) on 08/23/24 at 1521  Med List Status: <None>   Medication Order Taking? Sig Documenting Provider Last Dose Status Informant  acetaminophen  (TYLENOL ) 500 MG tablet [id number] Yes Take 1,000 mg by mouth daily as needed for mild pain (pain score 1-3). [provider]  Active Self, Pharmacy Records           Med Note (CRUTHIS, SHEFFIELD JAYSON Kitchens Aug 17, 2022  7:29 AM)    albuterol  (PROAIR  HFA) 108 [phone] Base) MCG/ACT inhaler [id number] Yes Inhale 2 puffs into the lungs every 4 (four) hours as needed for wheezing or shortness of breath. Paseda, [name], FNP  Active Self, Pharmacy Records  albuterol  (PROVENTIL ) (2.5 MG/3ML) 0.083%  nebulizer solution [id number] Yes Take 3 mLs (2.5 mg total) by nebulization daily as needed for wheezing or shortness of breath. Earley Saucer, MD  Active Self, Pharmacy Records  allopurinol  (ZYLOPRIM ) 100 MG tablet [id number] Yes Take 0.5 tablets (50 mg total) by mouth daily. Paseda, [name], FNP  Active Self, Pharmacy Records  arformoterol  (BROVANA ) 15  MCG/2ML NEBU [id number] Yes Take 2 mLs (15 mcg total) by nebulization 2 (two) times daily. Parrett, Madelin RAMAN, NP  Active Self, Pharmacy Records  aspirin  EC 81 MG tablet [id number] Yes Take 1 tablet (81 mg total) by mouth daily. Swallow whole. Santo Stanly LABOR, MD  Active Self, Pharmacy Records  atorvastatin  (LIPITOR) 40 MG tablet [id number] Yes TAKE 1 TABLET BY MOUTH DAILY Paseda, [name], FNP  Active Self, Pharmacy Records  B Complex-C (B-COMPLEX WITH VITAMIN C) tablet [id number] Yes Take 1 tablet by mouth daily. Tobie Yetta HERO, MD  Active Self, Pharmacy Records           Med Note (CRUTHIS, SHEFFIELD BROCKS   Thu Mar 02, 2024  7:16 AM)    budesonide  (PULMICORT ) 0.25 MG/2ML nebulizer solution [id number] Yes INHALE THE CONTENTS OF 1 VIAL VIA NEBULIZER TWICE DAILY AS DIRECTED WITH ALBUTEROL  Darlean Ozell NOVAK, MD  Active Self, Pharmacy Records  calcitRIOL  (ROCALTROL ) 0.25 MCG capsule [id number] Yes Take 1 capsule (0.25 mcg total) by mouth daily. Paseda, [name], FNP  Active Self, Pharmacy Records  cetirizine  (ZYRTEC ) 10 MG chewable tablet [id number] Yes Chew 10 mg by mouth at bedtime. [provider]  Active Self, Pharmacy Records  diclofenac  Sodium (VOLTAREN ) 1 % GEL [id number] Yes Apply 2 g topically 4 (four) times daily. Tobie Yetta HERO, MD  Active Self, Pharmacy Records           Med Note (HEATER, MEGAN S   Tue Aug 01, 2024 11:40 AM) prn  docusate sodium  (STOOL SOFTENER) 100 MG capsule [id number] Yes Take 100 mg by mouth daily. [provider]  Active Self, Pharmacy Records           Med Note (CRUTHIS, SHEFFIELD BROCKS Kitchens Aug 17, 2022  7:30 AM)    ezetimibe  (ZETIA ) 10 MG tablet [id number] Yes Take 1 tablet (10 mg total) by mouth daily. Paseda, [name], FNP  Active Self, Pharmacy Records  famotidine  (PEPCID ) 20 MG tablet [id number] Yes TAKE 1 TABLET AFTER SUPPER  Patient taking differently: Take 20 mg by mouth at bedtime. TAKE 1 TABLET AFTER SUPPER   Paseda, [name], FNP  Active Self,  Pharmacy Records  folic acid  (FOLVITE ) 1 MG tablet [id number] Yes Take 1 tablet (1 mg total) by mouth daily. Tobie Yetta HERO, MD  Active Self, Pharmacy Records  furosemide  (LASIX ) 40 MG tablet [id number] Yes Take 1 tablet (40 mg total) by mouth daily. Paseda, [name], FNP  Active Self, Pharmacy Records  guaiFENesin  [facility]) 100 MG/5ML liquid [id number] Yes Take 5 mLs by mouth every 4 (four) hours as needed for cough or to loosen phlegm. Jadine Toribio SQUIBB, MD  Active   hydrOXYzine  (ATARAX ) 25 MG tablet [id number] Yes Take 1 tablet (25 mg total) by mouth 3 (three) times daily as needed for itching. Paseda, [name], FNP  Active Self, Pharmacy Records  Incontinence Supply Disposable (DEPEND UNDERWEAR LARGE/XL) MISC [id number] Yes 1 each by Does not apply route 2 times daily at 12 noon and 4 pm. Tilford Bertram HERO, FNP  Active Self, Pharmacy Records  montelukast  (SINGULAIR ) 10 MG tablet [id number] Yes Take 1 tablet (  10 mg total) by mouth at bedtime. Paseda, [name], FNP  Active Self, Pharmacy Records  pantoprazole  (PROTONIX ) 40 MG tablet [id number] Yes TAKE 1 TABLET EVERY DAY 30 TO 60 MINUTES BEFORE FIRST MEAL OF THE DAY Paseda, [name], FNP  Active Self, Pharmacy Records            Goals Addressed             This Visit's Progress    VBCI Transitions of Care (TOC) Care Plan       Problems:  Recent Hospitalization for treatment of COPD No Hospital Follow Up Provider appointment patient states she will call and schedule visit and No Specialist appointment patient states she is waiting to hear back from pulmonology office to schedule Patient reports having hospital follow up appointment with her primary care provider on 08/11/24 and follow up visit with the pulmonologist on 09/11/24.   Goal:  Over the next 30 days, the patient will not experience hospital readmission  Interventions:  COPD Interventions: Advised patient to self assesses COPD action plan zone and make appointment with  provider if in the yellow zone for 48 hours without improvement Use of home oxygen - discussed ongoing oxygen  safety Reviewed upcoming provider visits.  Reminded patient of upcoming social work visit on 08/23/24.  Confirmed patient using Bipap at hs and is wearing oxygen  at 3 L Assessed for oxygen  saturation and advised to monitor daily Confirmed patient using inhaler and nebulizer as prescribed Medications reviewed and compliance discussed and advised.  Discussed COPD triggers and ways to avoid especially during extremely cold temperatures.  Assessed for any new/ ongoing symptoms.   Patient Self Care Activities:  Attend all scheduled provider appointments Call pharmacy for medication refills 3-7 days in advance of running out of medications Call provider office for new concerns or questions  Take medications as prescribed   limit outdoor activity during cold weather follow rescue plan if symptoms flare-up Stay hydrated Continue to use inhaler and nebulizer as prescribed.  Wear oxygen  as prescribed and practice safety strategies.  Continue to monitor oxygen  saturation daily  Avoid COPD triggers:  smoke, change in weather, smells that bother you.   Plan:  No further follow up required: patient completed 30 day TOC program and has met program goals.          Recommendation:   Continue Current Plan of Care  Follow Up Plan:   Closing From:  Transitions of Care Program  Arvin Seip RN, BSN, CCM [place]  [facility], Population Health Case Manager Phone: [phone]     "

## 2024-08-23 NOTE — Patient Outreach (Signed)
 Social Drivers of Health  Community Resource and Care Coordination Visit Note   08/23/2024  Name: Pamela [name] MRN: [id number] DOB:03-02-1951  Situation: Referral received for [facility] needs assessment and assistance related to Food Insecurity  Medicaid application . I obtained verbal consent from Patient.  Visit completed with Patient on the phone.   Background:   SDOH Interventions Today    Flowsheet Row Most Recent Value  SDOH Interventions   Food Insecurity Interventions Other (Comment)  [Petient declined MOW (does not like the food) and stated that she heard of another program through Medicare for free food and she was going to call them today]  Housing Interventions Intervention Not Indicated  Transportation Interventions Intervention Not Indicated  Utilities Interventions Intervention Not Indicated     Assessment:   Goals Addressed             This Visit's Progress    [facility] Social Work Care Plan   On track    Current SDOH Barriers:  Financial constraints related to high light bill at the home where she lives with her son and patient wants to apply for Medicaid.   Interventions: Referred patient to community resources  SW made a referral to the walk in clinic for Medicaid. Patietn has already applied for Medicaid at [facility].           Recommendation:   attend all scheduled provider appointments call and/or follow up with the Medicare program that you were telling the SW about for food assistance Talk to the Medicaid walk in clinic rep when she calls regarding your application that is pending.   Follow Up Plan:   Telephone follow up appointment date/time:  09/06/2024 at 10:00 am  Pamela CHARM [name] HEDWIG, PhD [facility] Social Worker Direct Dial: [phone]  Fax: [phone]

## 2024-08-24 ENCOUNTER — Other Ambulatory Visit: Payer: Self-pay | Admitting: Nurse Practitioner

## 2024-08-24 DIAGNOSIS — R928 Other abnormal and inconclusive findings on diagnostic imaging of breast: Secondary | ICD-10-CM

## 2024-08-30 ENCOUNTER — Other Ambulatory Visit

## 2024-08-30 ENCOUNTER — Encounter

## 2024-09-06 ENCOUNTER — Other Ambulatory Visit: Admitting: Licensed Clinical Social Worker

## 2024-09-08 ENCOUNTER — Ambulatory Visit: Payer: Self-pay | Admitting: Nurse Practitioner

## 2024-09-11 ENCOUNTER — Ambulatory Visit: Admitting: Adult Health

## 2024-10-27 ENCOUNTER — Ambulatory Visit: Admitting: Internal Medicine

## 2024-12-12 ENCOUNTER — Encounter: Admitting: Nurse Practitioner

## 2024-12-22 ENCOUNTER — Other Ambulatory Visit

## 2024-12-22 ENCOUNTER — Ambulatory Visit: Admitting: Hematology

## 2025-01-12 ENCOUNTER — Encounter: Payer: Self-pay | Admitting: Nurse Practitioner

## 2025-02-26 ENCOUNTER — Ambulatory Visit: Payer: Self-pay
